# Patient Record
Sex: Male | Born: 1940 | Race: White | Hispanic: No | Marital: Married
Health system: Midwestern US, Community
[De-identification: ages and names within clinical notes are randomized; demographics above are authoritative.]

## PROBLEM LIST (undated history)

## (undated) DIAGNOSIS — J159 Unspecified bacterial pneumonia: Secondary | ICD-10-CM

## (undated) DIAGNOSIS — K7460 Unspecified cirrhosis of liver: Secondary | ICD-10-CM

## (undated) DIAGNOSIS — M4850XA Collapsed vertebra, not elsewhere classified, site unspecified, initial encounter for fracture: Principal | ICD-10-CM

## (undated) DIAGNOSIS — M549 Dorsalgia, unspecified: Secondary | ICD-10-CM

## (undated) DIAGNOSIS — IMO0002 Reserved for concepts with insufficient information to code with codable children: Secondary | ICD-10-CM

## (undated) DIAGNOSIS — I82409 Acute embolism and thrombosis of unspecified deep veins of unspecified lower extremity: Principal | ICD-10-CM

## (undated) DIAGNOSIS — J90 Pleural effusion, not elsewhere classified: Secondary | ICD-10-CM

## (undated) DIAGNOSIS — I5022 Chronic systolic (congestive) heart failure: Principal | ICD-10-CM

## (undated) DIAGNOSIS — I420 Dilated cardiomyopathy: Secondary | ICD-10-CM

## (undated) DIAGNOSIS — M545 Low back pain, unspecified: Secondary | ICD-10-CM

## (undated) DIAGNOSIS — E8801 Alpha-1-antitrypsin deficiency: Principal | ICD-10-CM

## (undated) DIAGNOSIS — M5430 Sciatica, unspecified side: Secondary | ICD-10-CM

## (undated) DIAGNOSIS — R053 Chronic cough: Secondary | ICD-10-CM

## (undated) DIAGNOSIS — D469 Myelodysplastic syndrome, unspecified: Principal | ICD-10-CM

## (undated) DIAGNOSIS — I10 Essential (primary) hypertension: Secondary | ICD-10-CM

## (undated) DIAGNOSIS — I2510 Atherosclerotic heart disease of native coronary artery without angina pectoris: Secondary | ICD-10-CM

## (undated) DIAGNOSIS — E119 Type 2 diabetes mellitus without complications: Secondary | ICD-10-CM

## (undated) DIAGNOSIS — Z973 Presence of spectacles and contact lenses: Secondary | ICD-10-CM

## (undated) DIAGNOSIS — E785 Hyperlipidemia, unspecified: Secondary | ICD-10-CM

## (undated) DIAGNOSIS — K219 Gastro-esophageal reflux disease without esophagitis: Secondary | ICD-10-CM

## (undated) DIAGNOSIS — C801 Malignant (primary) neoplasm, unspecified: Secondary | ICD-10-CM

## (undated) DIAGNOSIS — G459 Transient cerebral ischemic attack, unspecified: Secondary | ICD-10-CM

## (undated) DIAGNOSIS — M1990 Unspecified osteoarthritis, unspecified site: Secondary | ICD-10-CM

## (undated) DIAGNOSIS — I739 Peripheral vascular disease, unspecified: Secondary | ICD-10-CM

## (undated) DIAGNOSIS — Z974 Presence of external hearing-aid: Secondary | ICD-10-CM

## (undated) HISTORY — PX: TONSILLECTOMY: SUR1361

## (undated) HISTORY — PX: COLONOSCOPY: SHX174

---
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
## 1965-03-08 HISTORY — PX: SHOULDER ACROMIOPLASTY: SHX6093

## 1988-03-08 HISTORY — PX: HERNIA REPAIR: SHX51

## 2002-03-08 DIAGNOSIS — I219 Acute myocardial infarction, unspecified: Secondary | ICD-10-CM

## 2002-03-08 HISTORY — DX: Acute myocardial infarction, unspecified: I21.9

## 2002-09-05 ENCOUNTER — Inpatient Hospital Stay (HOSPITAL_COMMUNITY): Admission: EM | Admit: 2002-09-05 | Discharge: 2002-09-08 | Payer: Self-pay | Admitting: *Deleted

## 2002-09-05 ENCOUNTER — Encounter: Payer: Self-pay | Admitting: *Deleted

## 2002-09-21 ENCOUNTER — Inpatient Hospital Stay (HOSPITAL_COMMUNITY): Admission: EM | Admit: 2002-09-21 | Discharge: 2002-09-22 | Payer: Self-pay | Admitting: *Deleted

## 2002-09-21 ENCOUNTER — Encounter: Payer: Self-pay | Admitting: Emergency Medicine

## 2003-03-09 HISTORY — PX: CARDIAC CATHETERIZATION: SHX172

## 2005-12-09 ENCOUNTER — Ambulatory Visit: Payer: Self-pay | Admitting: Gastroenterology

## 2006-11-09 ENCOUNTER — Ambulatory Visit: Payer: Self-pay

## 2006-12-14 ENCOUNTER — Other Ambulatory Visit: Payer: Self-pay

## 2006-12-14 ENCOUNTER — Inpatient Hospital Stay: Payer: Self-pay | Admitting: Cardiovascular Disease

## 2007-11-09 ENCOUNTER — Ambulatory Visit: Payer: Self-pay | Admitting: Cardiology

## 2008-02-16 LAB — CBC WITH AUTO DIFFERENTIAL
Basophils %: 0.5 % (ref 0.0–2.0)
Basophils Absolute: 0 10*3 (ref 0.0–0.2)
Eosinophils %: 3.1 % (ref 0.0–5.0)
Eosinophils Absolute: 0.1 10*3 (ref 0.0–0.6)
Granulocyte Absolute Count: 1.5 10*3 — ABNORMAL LOW (ref 1.7–7.7)
Hematocrit: 36.7 % — ABNORMAL LOW (ref 40.5–52.5)
Hemoglobin: 12.7 g/dL — ABNORMAL LOW (ref 13.5–17.5)
Lymphocytes %: 40.1 % — ABNORMAL HIGH (ref 25.0–40.0)
Lymphocytes Absolute: 1.4 10*3 (ref 1.0–5.1)
MCH: 37.1 pg — ABNORMAL HIGH (ref 26–34)
MCHC: 34.6 g/dL (ref 31–36)
MCV: 107.2 fl — ABNORMAL HIGH (ref 80–100)
MPV: 10.3 fl (ref 5.0–10.5)
Monocytes %: 13.6 % — ABNORMAL HIGH (ref 0.0–8.0)
Monocytes Absolute: 0.5 10*3 (ref 0.0–0.95)
Platelets: 102 10*3 — ABNORMAL LOW (ref 135–450)
RBC: 3.42 10*6 — ABNORMAL LOW (ref 4.2–5.9)
RDW: 15.7 % — ABNORMAL HIGH (ref 11.5–14.5)
Segs Relative: 42.7 % (ref 42.0–63.0)
WBC: 3.5 10*3 — ABNORMAL LOW (ref 4.0–11.0)

## 2008-02-16 LAB — COMPREHENSIVE METABOLIC PANEL
ALT: 46 U/L — ABNORMAL HIGH (ref 10–40)
AST: 60 U/L — ABNORMAL HIGH (ref 15–37)
Albumin/Globulin Ratio: 1 — ABNORMAL LOW (ref 1.1–2.2)
Albumin: 3 g/dL — ABNORMAL LOW (ref 3.4–5.0)
Alkaline Phosphatase: 102 U/L — ABNORMAL HIGH (ref 25–100)
BUN: 24 mg/dL — ABNORMAL HIGH (ref 7–18)
CO2: 25 meq/L (ref 21–32)
Calcium: 9 mg/dL (ref 8.3–10.6)
Chloride: 113 meq/L — ABNORMAL HIGH (ref 99–110)
Creatinine: 0.8 mg/dL (ref 0.8–1.3)
GFR Est, African/Amer: >60
GFR, Estimated: >60 (ref >60)
Glucose: 102 mg/dL — ABNORMAL HIGH (ref 70–99)
Potassium: 4.1 meq/L (ref 3.5–5.1)
Sodium: 145 meq/L (ref 136–145)
Total Bilirubin: 0.7 mg/dL (ref 0.0–1.0)
Total Protein: 6.1 g/dL — ABNORMAL LOW (ref 6.4–8.2)

## 2008-02-23 ENCOUNTER — Ambulatory Visit: Payer: Self-pay

## 2008-02-26 ENCOUNTER — Ambulatory Visit: Payer: Self-pay

## 2008-07-15 NOTE — Telephone Encounter (Signed)
Patient was calling because his wife was in to see the Dr this morning and she was given information on My Chart. He said he went in to try and register himself but it told him he needed a pass code in order to do that. Does he need to be seen before he can sign up for this service or is there a generic pass code that he can use to get access to it.  Please give him a call and let him know. Thanks

## 2008-07-15 NOTE — Telephone Encounter (Signed)
His spouse advised that he needs to be seen before he can sign up for my chart.

## 2008-08-23 NOTE — Progress Notes (Signed)
Subjective:      Patient ID: Andrew [name] is a 68 y.o. male.    Hypertension  This is a chronic problem. The problem is controlled. There are no associated agents to hypertension. He has tried ACE inhibitors and alpha 1 blockers for the symptoms. The current treatment provides significant improvement of lipids. There are no compliance problems.       Review of Systems   Constitutional: Negative.    HENT: Negative.    Respiratory: Negative.    Genitourinary: Negative.      BP 121/61   Pulse 77   Wt 236 lb (107.049 kg)    Objective:   Physical Exam   Vitals reviewed.  Constitutional: He is oriented to person, place, and time. He appears well-developed and well-nourished.        Overweight   HENT:   Head: Normocephalic.   Neck: Normal range of motion. Neck supple. No thyromegaly present.   Cardiovascular: Normal rate, normal heart sounds and intact distal pulses.    Pulmonary/Chest: Effort normal and breath sounds normal.   Musculoskeletal: Edema: ankles.   Lymphadenopathy:     He has no cervical adenopathy.   Neurological: He is alert and oriented to person, place, and time.   Skin: Skin is warm and dry.   Psychiatric: He has a normal mood and affect.       A/P        HTN Well controlled, continue current regimen.  Lymphedema legs no tx.  He will try to lose weight.  He declined stockings and lymphedema tx.

## 2008-08-26 NOTE — Progress Notes (Signed)
Subjective:      Patient ID: Andrew [name] is a 68 y.o. male.    HPI  Comes in to get skin tags removed.  He has one in the left axilla and one in the right groin.  The one in the groin is often irritated  Review of Systems   Constitutional: Negative.    Respiratory: Negative.    Cardiovascular: Negative.      BP 122/74   Wt 237 lb (107.502 kg)    Objective:   Physical Exam   Constitutional: He appears well-developed and well-nourished.   Pulmonary/Chest: Effort normal and breath sounds normal.   Abdominal: Soft. Bowel sounds are normal.   Skin:        Skin tag left axilla and right groin removed with a scalpel after both ares were cleansed with betadine and then covered with gauge.  He tolerated the procedure well.       A/P      Skin Tags removed

## 2008-10-01 ENCOUNTER — Ambulatory Visit: Payer: Self-pay | Admitting: Unknown Physician Specialty

## 2008-10-14 ENCOUNTER — Ambulatory Visit: Payer: Self-pay | Admitting: Unknown Physician Specialty

## 2008-11-26 NOTE — Progress Notes (Signed)
Subjective:      Patient ID: Andrew [name] is a 68 y.o. male.    HPI  Hypertension:  Denies CP, SOB, visual changes, dizziness, palpitations or HA.  He is adherent to a low sodium diet.  Blood pressure typically runs Ok outside of the office.       Review of Systems   Constitutional: Negative.    HENT: Negative.    Respiratory: Negative.    Cardiovascular: Negative.      BP 116/70   Pulse 85   Wt 234 lb 12.8 oz (106.505 kg)    Objective:   Physical Exam   Vitals reviewed.  Constitutional: He is oriented to person, place, and time. He appears well-developed and well-nourished.   HENT:   Head: Normocephalic.   Neck: Normal range of motion. Neck supple. No thyromegaly present.   Cardiovascular: Normal rate, normal heart sounds and intact distal pulses.    Pulmonary/Chest: Effort normal and breath sounds normal.   Lymphadenopathy:     He has no cervical adenopathy.   Neurological: He is alert and oriented to person, place, and time.   Skin: Skin is warm and dry.   Psychiatric: He has a normal mood and affect.       A/p      HTN - Well controlled, continue current regimen.

## 2009-01-03 NOTE — Telephone Encounter (Signed)
From: [name],Andrew   To: Earle Gell, MD   Sent: Fri Jan 03, 2009 2:54 PM   Subject: Prescription Question    Concerning my ED problem, I would like to try Cialis daily (30 tablets) if you think it would be safe for me. If you think it's ok, then could you write me a prescription for it? If I have to have an appointment, please contact me to schedule it.

## 2009-01-20 MED ORDER — TADALAFIL 5 MG PO TABS
5 | ORAL_TABLET | ORAL | Status: DC | PRN
Start: 2009-01-20 — End: 2009-04-30

## 2009-01-20 NOTE — Progress Notes (Signed)
Subjective:      Patient ID: Andrew [name] is a 68 y.o. male.    HPI  Hypertension:  Blood pressure typically runs ok outside of the office.   He is adherent to a low sodium diet. Patient denies dizzness.  Antihypertensive medication side effects: no medication side effects noted.  Use of agents associated with hypertension: none.     ED - no response to 2.5mg  qd of cialis  Review of Systems   Constitutional: Negative.    Respiratory: Negative.    Cardiovascular: Negative.    Neurological: Negative.    Psychiatric/Behavioral: Negative.      BP 108/65  Pulse 70  Wt 238 lb (107.956 kg)    Objective:   Physical Exam   Vitals reviewed.  Constitutional: He is oriented to person, place, and time. He appears well-developed and well-nourished.   HENT:   Head: Normocephalic.   Neck: Normal range of motion. Neck supple. No thyromegaly present.   Cardiovascular: Normal rate, regular rhythm, normal heart sounds and intact distal pulses.    Pulmonary/Chest: Effort normal and breath sounds normal.   Lymphadenopathy:     He has no cervical adenopathy.   Neurological: He is alert and oriented to person, place, and time.   Skin: Skin is warm and dry.   Psychiatric: He has a normal mood and affect.       A/p      HTN - a little low since cialis started so will stop cardura and increase cialis to 5mg 

## 2009-03-11 ENCOUNTER — Encounter: Payer: Self-pay | Admitting: Unknown Physician Specialty

## 2009-04-08 ENCOUNTER — Encounter: Payer: Self-pay | Admitting: Unknown Physician Specialty

## 2009-04-30 NOTE — Assessment & Plan Note (Signed)
Check testosterone, meds have failed  Not inclined to see gu

## 2009-04-30 NOTE — Assessment & Plan Note (Signed)
Add diuretic  follow

## 2009-04-30 NOTE — Assessment & Plan Note (Signed)
Change to diuretic. Discontinue doxazosin. Wt loss bw

## 2009-04-30 NOTE — Assessment & Plan Note (Signed)
Recheck apparently has had bm

## 2009-04-30 NOTE — Progress Notes (Signed)
Chief Complaint   Patient presents with   ??? Establish Care     hypertension         HPI    1. HTN (hypertension) (401.9AF)        Daily cialis not effective. Now taking doxazosin again.     Filed Vitals:    04/30/2009  1:32 PM   BP: 110/70   Pulse: 76   Weight: 241 lb 12.8 oz (109.68 kg)       Wt Readings from Last 3 Encounters:   04/30/2009 241 lb 12.8 oz (109.68 kg)   01/20/2009 238 lb (107.956 kg)   11/26/2008 234 lb 12.8 oz (106.505 kg)         BP Readings from Last 3 Encounters:   04/30/2009 110/70   01/20/2009 108/65   11/26/2008 116/70           Review of Systems      Respiratory: Negative.    Cardiovascular: Negative.       Physical Exam:       Cardiovascular: Normal rate, regular rhythm and normal heart sounds.    Pulmonary/Chest: Effort normal and breath sounds normal.   LEs:  1+ edema.        Assessment/Plan        Plan  Edema - Skipper Cliche, MD  04/30/09 02:28 PM  Signed  Add diuretic  follow    Erectile dysfunction - Skipper Cliche, MD  04/30/09 02:29 PM  Signed  Check testosterone, meds have failed  Not inclined to see gu    HTN (hypertension) - Skipper Cliche, MD  04/30/09 02:30 PM  Signed  Change to diuretic. Discontinue doxazosin. Wt loss bw    Thrombocytopenia - Skipper Cliche, MD  04/30/09 02:30 PM  Signed  Recheck apparently has had bm                       Refuses pvx  Subjective:      Patient ID: Andrew [name] is a 69 y.o. male.    HPI    Review of Systems    Objective:   Physical Exam  .

## 2009-05-01 LAB — CBC WITH AUTO DIFFERENTIAL
Basophils %: 0.5 % (ref 0.0–2.0)
Basophils Absolute: 0 10*3 (ref 0.0–0.2)
Eosinophils %: 2.6 % (ref 0.0–5.0)
Eosinophils Absolute: 0.1 10*3 (ref 0.0–0.6)
Granulocyte Absolute Count: 1.8 10*3 (ref 1.7–7.7)
Hematocrit: 36.4 % — ABNORMAL LOW (ref 40.5–52.5)
Hemoglobin: 12.9 g/dL — ABNORMAL LOW (ref 13.5–17.5)
Lymphocytes %: 38.4 % (ref 25.0–40.0)
Lymphocytes Absolute: 1.4 10*3 (ref 1.0–5.1)
MCH: 37.7 pg — ABNORMAL HIGH (ref 26–34)
MCHC: 35.4 g/dL (ref 31–36)
MCV: 106.3 fl — ABNORMAL HIGH (ref 80–100)
MPV: 10.3 fl (ref 5.0–10.5)
Monocytes %: 10.2 %
Monocytes Absolute: 0.4 10*3
Platelets: 94 10*3 — ABNORMAL LOW (ref 135–450)
RBC: 3.43 10*6 — ABNORMAL LOW (ref 4.2–5.9)
RDW: 16.4 % — ABNORMAL HIGH (ref 11.5–14.5)
Segs Relative: 48.3 % (ref 42.0–63.0)
WBC: 3.8 10*3 — ABNORMAL LOW (ref 4.0–11.0)

## 2009-05-01 LAB — COMPREHENSIVE METABOLIC PANEL
ALT: 49 U/L — ABNORMAL HIGH (ref 10–40)
AST: 72 U/L — ABNORMAL HIGH (ref 15–37)
Albumin/Globulin Ratio: 1.1 (ref 1.1–2.2)
Albumin: 3.2 g/dL — ABNORMAL LOW (ref 3.4–5.0)
Alkaline Phosphatase: 99 U/L (ref 45–129)
BUN: 17 mg/dL (ref 7–18)
CO2: 28 meq/L (ref 21–32)
Calcium: 9.1 mg/dL (ref 8.3–10.6)
Chloride: 114 meq/L — ABNORMAL HIGH (ref 99–110)
Creatinine: 0.8 mg/dL (ref 0.8–1.3)
GFR Est, African/Amer: >60
GFR, Estimated: >60 (ref >60)
Glucose: 85 mg/dL (ref 70–99)
Potassium: 4.3 meq/L (ref 3.5–5.1)
Sodium: 145 meq/L (ref 136–145)
Total Bilirubin: 1.6 mg/dL — ABNORMAL HIGH (ref 0.0–1.0)
Total Protein: 6.3 g/dL — ABNORMAL LOW (ref 6.4–8.2)

## 2009-05-01 LAB — VITAMIN B12: Vitamin B-12: >2000 pg/mL — ABNORMAL HIGH (ref 211–911)

## 2009-05-02 LAB — TESTOSTERONE FREE AND TOTAL MALE
Free Testosterone: 54 pg/mL (ref 47–244)
Sex Hormone Binding: 83 nmol/L — ABNORMAL HIGH (ref 11–80)
Testosterone % Free: 1 % — ABNORMAL LOW (ref 1.6–2.9)
Total Testosterone: 526 ng/dL (ref 300–720)

## 2009-05-06 ENCOUNTER — Encounter: Payer: Self-pay | Admitting: Unknown Physician Specialty

## 2009-06-06 ENCOUNTER — Encounter: Payer: Self-pay | Admitting: Unknown Physician Specialty

## 2009-08-01 NOTE — Assessment & Plan Note (Signed)
Trial of flomax

## 2009-08-01 NOTE — Progress Notes (Signed)
Chief Complaint   Patient presents with   ??? 3 Month Follow-Up     medication change; swelling not improved       HPI    1. HTN (hypertension)    2. BPH (benign prostatic hyperplasia)    3. Edema        No improvement in edema in ankles. No difference in his breathing.       Filed Vitals:    08/01/09 0859   BP: 134/78   Pulse: 68   Weight: 240 lb (108.863 kg)     Wt Readings from Last 3 Encounters:   08/01/09 240 lb (108.863 kg)   04/30/09 241 lb 12.8 oz (109.68 kg)   01/20/09 238 lb (107.956 kg)     BP Readings from Last 3 Encounters:   08/01/09 134/78   04/30/09 110/70   01/20/09 108/65       Review of Systems    Some urinary sxs and erectile problems unchanged.    Physical Exam:     Trace pedal edema.    Assessment/Plan       Edema  Increase diuretic. follow    HTN (hypertension)  Good control    BPH (benign prostatic hyperplasia)  Trial of flomax

## 2009-08-01 NOTE — Assessment & Plan Note (Signed)
Increase diuretic. follow

## 2009-08-01 NOTE — Assessment & Plan Note (Signed)
Good control

## 2009-08-06 ENCOUNTER — Ambulatory Visit: Payer: Self-pay | Admitting: Internal Medicine

## 2009-10-08 NOTE — Telephone Encounter (Signed)
Spoke to pt's wife-she is driving back from [facility] (he was a pt at [facility] for 2 days there)-after he showed signs of being slower than usual,couldn't coordinate his actions and had tremors. In hospital for 2 days.  MRI at hospital unkown results.  Pt has a liver disease and she thought that his problems were due to that.  Wife stated she was concerned because last night he was swelling and sounds like he is having noisy breathing.  Pt denies trouble breathing.  Advised pt to go to hospital if they are concerned.  They have records from hospital and will bring them if they keep tomorrow's appt.  Asked wife to call and let us know if they go to hospital.

## 2009-10-08 NOTE — Telephone Encounter (Signed)
The following message was left on the Non-Emergency Line:  Andrew [name] states that she is returning from vacation and states that patient is swollen, and nothing hurts. She is concerned because he had been in the hospital while in [place].  She would like the doctors recommendation.   Should patient keep his appointment Thursday or should he go to the hospital once they arrive in [place].  She did Not leave a telephone #, so I listened to message details and it provided [phone].  When she called, she was in [place] @ that time.  Please contact her @ phone # provided.

## 2009-10-08 NOTE — Telephone Encounter (Signed)
FYI Records here.

## 2009-10-08 NOTE — Telephone Encounter (Signed)
Requested Medical records from hospital in [facility]

## 2009-10-08 NOTE — Telephone Encounter (Signed)
He should keep appt unless he is sob. Or its up to them

## 2009-10-09 LAB — AMMONIA: Ammonia: 90 umol/L — ABNORMAL HIGH (ref 11–32)

## 2009-10-09 NOTE — Progress Notes (Signed)
Assessment/Plan     HTN (hypertension)  Stop bp meds    Edema  For likely cirrhosis, start aldactone 100 qam and follow with weights and renal functions.    Cirrhosis  Refer to gi.  Start lactulose 20 gm bid.    Alpha 1 antitrypsin deficiency  Check pfts. cxr was negative.        Return in about 4 weeks (around 11/06/2009).       HPI     Chief Complaint   Patient presents with   ??? Follow-Up from Hospital     Presenting problem(s):  1. Thrombocytopenia    2. Alpha 1 antitrypsin deficiency    3. HTN (hypertension)    4. Edema    5. Cirrhosis      Hospitalized with probable cirrhosis and elevated ammonia levels. , uti, bph. Found to have significant tremor, mental status changes. Head CT negative. Started on lactulose and now off it. Ammonia level went from 116 to 53.   Started on amlodipine for bp off lisinopril/hct.      Filed Vitals:    10/09/09 1348   BP: 122/74   Pulse: 80   Weight: 251 lb (113.853 kg)     Wt Readings from Last 3 Encounters:   10/09/09 251 lb (113.853 kg)   08/01/09 240 lb (108.863 kg)   04/30/09 241 lb 12.8 oz (109.68 kg)     BP Readings from Last 3 Encounters:   10/09/09 122/74   08/01/09 134/78   04/30/09 110/70       Review of Systems     As detailed in HPI. Denies dyspnea. Some increase in abdominal girth.     Physical Exam     Constitutional: Vital signs reviewed. Oriented to person, place, and time. appears well-developed and well-nourished. In no acute distress  Cardiovascular: Normal rate and regular rhythm.  Exam reveals no gallop and no friction rub. No murmur heard. 3+peripheral edema.  Resp: Effort normal and breath sounds normal. No respiratory distress. There are no wheezes or rales.         GI: Normal bowel sounds, abdomen soft nontender without masses. + fluid wave.    No asterixis evident      Skipper Cliche MD  [address]  [place], [place] [address]

## 2009-10-09 NOTE — Patient Instructions (Signed)
Gastroenterologists:    Stevphen Meuse, MD  [phone]    Jessica Priest, MD  [phone]    Nash Mantis, MD  [phone]    Anitra Lauth, MD  [phone]    Vevelyn Francois, MD  [phone]    Burnard Leigh, MD  [phone]

## 2009-10-09 NOTE — Assessment & Plan Note (Signed)
Refer to gi.  Start lactulose 20 gm bid.

## 2009-10-09 NOTE — Assessment & Plan Note (Signed)
Check pfts. cxr was negative.

## 2009-10-09 NOTE — Assessment & Plan Note (Signed)
For likely cirrhosis, start aldactone 100 qam and follow with weights and renal functions.

## 2009-10-09 NOTE — Assessment & Plan Note (Signed)
Stop bp meds

## 2009-10-10 LAB — LAB ORDER NOTIFICATION

## 2009-10-10 LAB — RENAL FUNCTION PANEL
Albumin: 3 gm/dl — ABNORMAL LOW (ref 3.4–5.0)
BUN: 21 mg/dl — ABNORMAL HIGH (ref 7–18)
CO2: 24 mEq/L (ref 21–32)
Calcium: 8.9 mg/dl (ref 8.3–10.6)
Chloride: 113 mEq/L — ABNORMAL HIGH (ref 99–110)
Creatinine: 0.8 mg/dl (ref 0.8–1.3)
GFR Est, African/Amer: >60
GFR, Estimated: >60 (ref >60)
Glucose: 100 mg/dl — ABNORMAL HIGH (ref 70–99)
Phosphorus: 2.9 mg/dl (ref 2.5–4.9)
Potassium: 4 mEq/L (ref 3.5–5.1)
Sodium: 142 mEq/L (ref 136–145)

## 2009-10-10 LAB — PROTIME-INR
INR: 1.18 — ABNORMAL HIGH
Protime: 12.9 s — ABNORMAL HIGH (ref 9.5–12.5)

## 2009-10-10 NOTE — Telephone Encounter (Signed)
She is calling to let th doctor know they could not run the ammonia specimen because it was not on ice.

## 2009-10-18 LAB — FERRITIN: Ferritin: 587.7 ng/ml — ABNORMAL HIGH (ref 22–322)

## 2009-10-18 LAB — PROTIME/INR & PTT
INR: 1.15
Protime: 12.6 s — ABNORMAL HIGH (ref 9.5–12.5)
aPTT: 29.7 s

## 2009-10-18 LAB — IRON AND TIBC
Iron Saturation: 69 % — ABNORMAL HIGH (ref 20–50)
Iron: 177 ug/dl — ABNORMAL HIGH (ref 35–150)
TIBC: 256 ug/dl — ABNORMAL LOW (ref 260–445)

## 2009-10-19 LAB — CERULOPLASMIN: Ceruloplasmin: 28 mg/dl (ref 17–54)

## 2009-10-19 LAB — F-ACTIN (SMOOTH MUSCLE) ANTIBODY W/ REFLEX TO TITER: F-Actin IgG: 14 units (ref 0–19)

## 2009-10-19 LAB — MITOCHONDRIAL ANTIBODIES, M2: Mitochondrial Ab: 3.3 units (ref 0.0–20.0)

## 2009-10-19 LAB — HEPATITIS A ANTIBODY, TOTAL: Hep A Total Ab: POSITIVE — AB

## 2009-10-20 LAB — HEPATITIS B SURFACE ANTIGEN: Hepatitis B Surface Ag: NEGATIVE

## 2009-10-20 LAB — ANA
ANA Titer: NEGATIVE
ANA: POSITIVE

## 2009-10-20 LAB — HEPATITIS B SURFACE ANTIBODY: Hep B S Ab: <2.8 m[IU]/mL — ABNORMAL LOW

## 2009-10-20 LAB — HEPATITIS C ANTIBODY: Hepatitis C Ab: NEGATIVE

## 2009-10-21 LAB — ALPHA-1-ANTITRYPSIN W/ PHENOTYPE: A-1 Antitrypsin: <30 mg/dl — ABNORMAL LOW (ref 100–200)

## 2009-10-30 LAB — AMMONIA: Ammonia: 106 umol/L (ref 11–32)

## 2009-10-30 NOTE — Assessment & Plan Note (Signed)
Check ammonia level and possibly titrate  Follow up dr Augusto Gamble

## 2009-10-30 NOTE — Assessment & Plan Note (Signed)
Check pft report

## 2009-10-30 NOTE — Assessment & Plan Note (Signed)
Improved. Check renal

## 2009-10-30 NOTE — Progress Notes (Signed)
Assessment/Plan     Edema  Improved. Check renal    Alpha 1 antitrypsin deficiency  Check pft report     Cirrhosis  Check ammonia level and possibly titrate  Follow up dr Augusto Gamble        Return in about 2 months (around 12/30/2009).       HPI     Chief Complaint   Patient presents with   ??? Cirrhosis     3 week follow up     Presenting problem(s):  1. Cirrhosis bms are only about two daily. Only one is loose. Taking 30cc daily   2. Edema has improved with diuretic.    3. Alpha 1 antitrypsin deficiency has had pft done.     On low protein diet.     Filed Vitals:    10/30/09 1306   BP: 108/70   Pulse: 60   Height: 5' 7.5" (1.715 m)   Weight: 231 lb (104.781 kg)     Wt Readings from Last 3 Encounters:   10/30/09 231 lb (104.781 kg)   10/09/09 251 lb (113.853 kg)   08/01/09 240 lb (108.863 kg)     BP Readings from Last 3 Encounters:   10/30/09 108/70   10/09/09 122/74   08/01/09 134/78       Review of Systems     As detailed in HPI.    Physical Exam     No edema  No asterixis      Skipper Cliche MD  [address]  [place], [place] [address]

## 2009-10-31 LAB — RENAL FUNCTION PANEL
Albumin: 3.2 gm/dl — ABNORMAL LOW (ref 3.4–5.0)
BUN: 23 mg/dl — ABNORMAL HIGH (ref 7–18)
CO2: 24 mEq/L (ref 21–32)
Calcium: 9.4 mg/dl (ref 8.3–10.6)
Chloride: 107 mEq/L (ref 99–110)
Creatinine: 1.3 mg/dl (ref 0.8–1.3)
GFR Est, African/Amer: >60
GFR, Estimated: 59 — ABNORMAL LOW (ref >60)
Glucose: 124 mg/dl — ABNORMAL HIGH (ref 70–99)
Phosphorus: 3.3 mg/dl (ref 2.5–4.9)
Potassium: 5.7 mEq/L — ABNORMAL HIGH (ref 3.5–5.1)
Sodium: 135 mEq/L — ABNORMAL LOW (ref 136–145)

## 2009-11-11 ENCOUNTER — Encounter

## 2009-11-11 NOTE — Telephone Encounter (Signed)
They are requesting recent labs ,pt is coming in for liver bx. Please fax records to [phone].

## 2009-11-11 NOTE — Telephone Encounter (Signed)
Faxed

## 2009-11-12 LAB — CBC WITH AUTO DIFFERENTIAL
Basophils %: 0.4 % (ref 0.0–2.0)
Basophils Absolute: 0 10*3 (ref 0.0–0.2)
Eosinophils %: 2.4 % (ref 0.0–5.0)
Eosinophils Absolute: 0.1 10*3 (ref 0.0–0.6)
Granulocyte Absolute Count: 2.5 10*3 (ref 1.7–7.7)
Hematocrit: 36.9 % — ABNORMAL LOW (ref 40.5–52.5)
Hemoglobin: 13 gm/dl — ABNORMAL LOW (ref 13.5–17.5)
Lymphocytes %: 34.2 % (ref 25.0–40.0)
Lymphocytes Absolute: 1.7 10*3 (ref 1.0–5.1)
MCH: 38.3 pg — ABNORMAL HIGH (ref 26–34)
MCHC: 35.2 gm/dl (ref 31–36)
MCV: 108.8 fl — ABNORMAL HIGH (ref 80–100)
MPV: 11.2 fl — ABNORMAL HIGH (ref 5.0–10.5)
Monocytes %: 13.3 % — ABNORMAL HIGH (ref 0.0–12.0)
Monocytes Absolute: 0.7 10*3 (ref 0.0–1.3)
Platelets: 93 10*3 — ABNORMAL LOW (ref 135–450)
RBC: 3.4 10*6 — ABNORMAL LOW (ref 4.2–5.9)
RDW: 15.8 % — ABNORMAL HIGH (ref 11.5–14.5)
Segs Relative: 49.7 % (ref 42.0–63.0)
WBC: 5 10*3 (ref 4.0–11.0)

## 2009-11-17 NOTE — Telephone Encounter (Signed)
Left info on machine

## 2009-11-17 NOTE — Telephone Encounter (Signed)
Message copied by Deirdre Evener on Mon Nov 17, 2009  1:50 PM  ------       Message from: Skipper Cliche       Created: Mon Nov 17, 2009 11:05 AM         Let him know his breathing test looked pretty good.       No signs of emphysema

## 2009-11-25 NOTE — Procedures (Signed)
PATIENT NAME:                   PA #:             MR #Andrew [name], Andrew [name]                  [id number]        [id number]         ATTENDING PHYSICIAN:                     DATE:        DIS DATE:       Lemar Livings, MD                  10/13/2009   10/13/2009      DATE OF BIRTH:     AGE:            PATIENT TYPE:      RM #:           09-Jun-1940         68              OPF                                   INTERPRETATION:  Spirometry reveals normal FVC and FEV1.  FEV1/FVC ratio is  normal.  Lung volumes reveal normal total lung capacity and vital capacity.   Residual volume is increased at 120% of predicted.  Diffusion capacity is  diminished at 72% of predicted.     IMPRESSION:  Normal spirometry with lung volume evidence for air trapping.   There is mild reduction in diffusion capacity as well.                                            Andrew Hurst, MD     [id number]  DD: 11/25/2009 08:38   DT: 11/25/2009 09:00   Job #: [id number]  CC: Lemar Livings, MD  CC: SEND NO RESULT NO PCP

## 2009-11-26 NOTE — Telephone Encounter (Signed)
From: [facility]   To: Skipper Cliche, MD   Sent: Tue Nov 25, 2009 4:12 PM   Subject: Prescription Question    Viewing my list of medications, I noticed that an update is in order. The list indicates that I am taking SPIRONOLACTONE 100MG  1Xday. This was changed, by Dr Augusto Gamble, to 50MG  1Xday. Dr Augusto Gamble also prescribed FUROSEMIDE 40MG  1xDAY & NADOLOL 20MG  1Xday.    Andrew [name]

## 2009-12-29 NOTE — Patient Instructions (Addendum)
Preventive Review                  5-10 year plan   Health Maintenance   Topic Date Due   ??? Tetanus Vaccine Adult (11 Years And Up)  01/24/1954   ??? Pneumovax Vaccine Adult (#1) 01/24/1962   ??? Psa  01/24/1993   ??? Flu Vaccine Yearly (Adult)  12/06/2009   ??? Colon Cancer Screening Colonoscopy  04/30/2010    Colonoscopy: due  PSA: not indicated  Immunizations due: pvx, flu  Also needs hep A, B series  Flu shot yearly in the fall   Exercise:   never Recommend 30 minutes exercise                  4-5 days per week    Lipids:   No results found for this basename: LDLCALC    Repeat due   Daily Aspirin:    No Not indicated   AAA Screen:     No Not indicated   Living Will:       Yes         Other risk factors and recommendations:    ?? Have blood pressure checked at least every year  ?? If you have elevated BP or cholesterol, have blood sugar checked every year  ?? Have an Eye Exam every 2-4 years     For useful medical information, I recommend [employer]. It is a very reliable and comprehensive site.

## 2009-12-29 NOTE — Assessment & Plan Note (Signed)
Trial off flomax

## 2009-12-29 NOTE — Assessment & Plan Note (Signed)
Per Dr. Daleen Bo. Improved.

## 2009-12-29 NOTE — Assessment & Plan Note (Signed)
Running low. Will try to cut back flomax

## 2009-12-29 NOTE — Progress Notes (Signed)
Andrew [name]    Annual Wellness Visit     BD: Dec 05, 1940          DOS: 12/29/2009    Assessment/Plan     Preventive plan provided to patient in written form per Patient Instructions and noted in Preventive Review/Plan in HPI.    HTN (hypertension)  Running low. Will try to cut back flomax    BPH (benign prostatic hyperplasia)  Trial off flomax    Cirrhosis  Per Dr. Daleen Bo. Improved.       RTO 4 months   HPI   Here for initial annual wellness visit.      Wondering aout salt substitute. Started on xafaxan. Should stay on lactulose for 3-4 bms daily.  Using three times daily per lactulose. edema improved.   Preventive Review/Plan:  Current Providers  See snapshot care team     Cognitive Impairment  Reports mild memory issues. Still driving. No asterixis.    Depression Screening  1. Over the past two weeks, have you felt down, depressed or hopeless? No  2. Over the past two weeks, have you felt little interest or pleasure in doing thing?No     Functional/Safety Assessment  1. Was the patient???s timed Up & Go test unsteady or longer than 30 seconds? No  2. Do you need help with the phone, transportation, shopping, preparing meals, housework,   laundry, medications or managing money?  No  3. Have you noticed any hearing difficulties? No                       Preventive Review                  5-10 year plan   Health Maintenance   Topic Date Due   ??? Tetanus Vaccine Adult (11 Years And Up)  01/24/1954   ??? Pneumovax Vaccine Adult (#1) 01/24/1962   ??? Psa  01/24/1993   ??? Flu Vaccine Yearly (Adult)  12/06/2009   ??? Colon Cancer Screening Colonoscopy  04/30/2010    Colonoscopy: due  PSA: not indicated  Immunizations due: pvx, flu  Also needs hep A, B series  Flu shot yearly in the fall   Exercise:   never Recommend 30 minutes exercise                  4-5 days per week    Lipids:   No results found for this basename: LDLCALC    Repeat due   Daily Aspirin:    No Not indicated   AAA Screen:     No Not indicated   Living Will:       Yes            Body mass index is 34.26 kg/(m^2).    Filed Vitals:    12/29/09 0903 12/29/09 0955   BP: 98/60 110/70   Pulse: 60    Height: 5' 7.5" (1.715 m)    Weight: 222 lb (100.699 kg)      Body mass index is 34.26 kg/(m^2).     Wt Readings from Last 3 Encounters:   12/29/09 222 lb (100.699 kg)   10/30/09 231 lb (104.781 kg)   10/09/09 251 lb (113.853 kg)     BP Readings from Last 3 Encounters:   12/29/09 110/70   10/30/09 108/70   10/09/09 122/74      Patient's medications, allergies, past medical, surgical, social and family histories were reviewed and updated as appropriate.  Review of Systems     Comprehensive review negative except as otherwise noted and in HPI.    Physical Exam     Constitutional:Vital signs reviewed. Appears well-developed and well-nourished. No acute distress.  Neck: Full ROM, trachea midline. No thyromegaly present. No cervical adenopathy  Cardiovascular: Normal rate, regular rhythm and normal heart sounds.    No murmur heard. No peripheral edema. No carotid bruits  Resp: Effort normal and breath sounds normal. No respiratory distress. There are no wheezes or rales.   GI: Normal bowel sounds, abdomen soft nontender without masses or hepatosplenomegaly   Musk: Gait coordinated and smooth. No clubbing or cyanosis.  Skin: Warm and dry normal turgor. No rashes, lesions or ulcers.  Neurological: CNs normal. DTRs 2+ bilaterally.  Psychiatric:Patient is alert and oriented to person, place, and time.  Normal mood and affect.     GU: Prostate not enlarged, smooth,  symmetric, no nodules. Hemoccult negative without rectal masses        Skipper Cliche MD  [facility]

## 2009-12-30 LAB — COMPREHENSIVE METABOLIC PANEL
ALT: 69 U/L — ABNORMAL HIGH (ref 10–40)
AST: 88 U/L — ABNORMAL HIGH (ref 15–37)
Albumin/Globulin Ratio: 0.9 — ABNORMAL LOW (ref 1.1–2.2)
Albumin: 3.4 gm/dl (ref 3.4–5.0)
Alkaline Phosphatase: 112 U/L (ref 45–129)
BUN: 33 mg/dl — ABNORMAL HIGH (ref 7–18)
CO2: 30 mEq/L (ref 21–32)
Calcium: 9.3 mg/dl (ref 8.3–10.6)
Chloride: 102 mEq/L (ref 99–110)
Creatinine: 1.5 mg/dl — ABNORMAL HIGH (ref 0.8–1.3)
GFR Est, African/Amer: 60 — ABNORMAL LOW
GFR, Estimated: 50 — ABNORMAL LOW (ref >60)
Glucose: 92 mg/dl (ref 70–99)
Potassium: 4.4 mEq/L (ref 3.5–5.1)
Sodium: 137 mEq/L (ref 136–145)
Total Bilirubin: 1.4 mg/dl — ABNORMAL HIGH (ref 0.0–1.0)
Total Protein: 7 gm/dl (ref 6.4–8.2)

## 2009-12-30 LAB — LIPID PANEL
Cholesterol, Total: 172 mg/dl (ref <200)
HDL: 59 mg/dl (ref 40–60)
LDL Calculated: 84 mg/dl (ref <100)
Triglycerides: 145 mg/dl (ref <150)
VLDL Cholesterol Calculated: 29 mg/dl

## 2010-01-16 NOTE — Telephone Encounter (Signed)
From: [facility]   To: Skipper Cliche, MD   Sent: Thu Jan 15, 2010 11:15 PM   Subject: Medication Renewal Request    Original authorizing provider: Skipper Cliche, MD    Andrew [name] would like a refill of the following medications:  Lactulose 20 GM/30ML SOLN Skipper Cliche, MD]    Preferred pharmacy: [facility] - [place], [place] - [address] - P [phone] - F [phone]    Comment:  Please contact [facility] to refill this prescription. Thank You! Andrew [name]. 90 day supply please.

## 2010-01-22 NOTE — Telephone Encounter (Signed)
Rx faxed to [facility].  14 day supply sent to [facility]

## 2010-01-22 NOTE — Telephone Encounter (Signed)
Pt called [facility] and they did not receive rx for Lactulose that was order on 11-10. Please refax rx to [facility]. Also pt is requesting a 14day supply of medication be called into [facility] [phone].

## 2010-01-30 NOTE — Telephone Encounter (Signed)
Ok to fill

## 2010-01-30 NOTE — Telephone Encounter (Signed)
From: [facility]   To: Skipper Cliche, MD   Sent: Thu Jan 29, 2010 11:36 AM   Subject: Non-Urgent Medical Question    [facility] rejected the perscription for LACTULOSE, because it specified two 30ml doses daily. I am taking three 30ml doses daily. [facility] wants you to send them a new perscription requiring three  doses daily.    Thank you, Andrew [name]

## 2010-02-13 LAB — CBC WITH AUTO DIFFERENTIAL
Basophils %: 0.5 % (ref 0.0–2.0)
Basophils Absolute: 0 10*3 (ref 0.0–0.2)
Eosinophils %: 2.4 % (ref 0.0–5.0)
Eosinophils Absolute: 0.1 10*3 (ref 0.0–0.6)
Granulocyte Absolute Count: 2 10*3 (ref 1.7–7.7)
Hematocrit: 36.1 % — ABNORMAL LOW (ref 40.5–52.5)
Hemoglobin: 12.8 gm/dl — ABNORMAL LOW (ref 13.5–17.5)
Lymphocytes %: 38.5 % (ref 25.0–40.0)
Lymphocytes Absolute: 1.7 10*3 (ref 1.0–5.1)
MCH: 39.5 pg — ABNORMAL HIGH (ref 26–34)
MCHC: 35.6 gm/dl (ref 31–36)
MCV: 111.1 fl — ABNORMAL HIGH (ref 80–100)
MPV: 10.4 fl (ref 5.0–10.5)
Monocytes %: 12.8 % — ABNORMAL HIGH (ref 0.0–12.0)
Monocytes Absolute: 0.5 10*3 (ref 0.0–1.3)
Platelets: 116 10*3 — ABNORMAL LOW (ref 135–450)
RBC: 3.25 10*6 — ABNORMAL LOW (ref 4.2–5.9)
RDW: 16.2 % — ABNORMAL HIGH (ref 11.5–14.5)
Segs Relative: 45.8 % (ref 42.0–63.0)
WBC: 4.3 10*3 (ref 4.0–11.0)

## 2010-02-13 LAB — COMPREHENSIVE METABOLIC PANEL
ALT: 66 U/L — ABNORMAL HIGH (ref 10–40)
AST: 88 U/L — ABNORMAL HIGH (ref 15–37)
Albumin/Globulin Ratio: 0.9 — ABNORMAL LOW (ref 1.1–2.2)
Albumin: 3.1 gm/dl — ABNORMAL LOW (ref 3.4–5.0)
Alkaline Phosphatase: 117 U/L (ref 45–129)
BUN: 25 mg/dl — ABNORMAL HIGH (ref 7–18)
CO2: 26 mEq/L (ref 21–32)
Calcium: 9.4 mg/dl (ref 8.3–10.6)
Chloride: 107 mEq/L (ref 99–110)
Creatinine: 1.2 mg/dl (ref 0.8–1.3)
GFR Est, African/Amer: >60
GFR, Estimated: >60 (ref >60)
Glucose: 92 mg/dl (ref 70–99)
Potassium: 4.8 mEq/L (ref 3.5–5.1)
Sodium: 138 mEq/L (ref 136–145)
Total Bilirubin: 1.7 mg/dl — ABNORMAL HIGH (ref 0.0–1.0)
Total Protein: 6.5 gm/dl (ref 6.4–8.2)

## 2010-02-13 LAB — PROTIME-INR
INR: 1.18 — ABNORMAL HIGH
Protime: 12.9 s — ABNORMAL HIGH (ref 9.5–12.5)

## 2010-02-13 NOTE — Telephone Encounter (Signed)
From: Dorko,Lyman   To: Skipper Cliche, MD   Sent: Thu Feb 12, 2010 8:17 PM   Subject: Non-Urgent Medical Question    I just re-filled my spironolacto 50 MG (QTY 30) prescription at [facility]. It has 8 refills left by 11/01/2010. If Possible, I'd like you to prescribe a 90 QTY supply and fax it to [facility].    Nicholad Kautzman  16-Jan-1941

## 2010-02-15 LAB — AFP TUMOR MARKER: AFP-Tumor Marker: 2 ng/ml (ref 0–9)

## 2010-03-08 HISTORY — PX: SHOULDER ARTHROSCOPY: SHX128

## 2010-03-16 MED ORDER — FOLIC ACID 1 MG PO TABS
1 MG | ORAL_TABLET | Freq: Every day | ORAL | Status: DC
Start: 2010-03-16 — End: 2010-10-05

## 2010-03-16 NOTE — Telephone Encounter (Signed)
Will fax per Dr [name]

## 2010-03-18 NOTE — Telephone Encounter (Signed)
From: [facility]   To: Skipper Cliche, MD   Sent: Tue Mar 17, 2010 9:03 AM   Subject: Prescription Question    I requested a Rx refill, for Folic Acid, to be faxed to [facility]. I received two messages from you. One indicating a refill request sent to [facility] and the other request sent to [facility]. I'm confused!    Andrew [name].

## 2010-04-06 MED ORDER — FUROSEMIDE 40 MG PO TABS
40 MG | ORAL_TABLET | Freq: Every day | ORAL | Status: DC
Start: 2010-04-06 — End: 2011-01-05

## 2010-04-06 NOTE — Assessment & Plan Note (Signed)
Recheck potassium  No treatment needed

## 2010-04-06 NOTE — Progress Notes (Signed)
Assessment/Plan     HTN (hypertension)  Recheck potassium  No treatment needed    BPH (benign prostatic hyperplasia)  Ok off medication        Return in about 6 months (around 10/05/2010).     HPI     Chief Complaint   Patient presents with   ??? 3 Month Follow-Up     1. Cirrhosis weight down, reducing carbs.    2. HTN (hypertension) bp running low still. No dizziness.    3. BPH (benign prostatic hyperplasia) ok off flomax. Getting up once nightly.   Still on spironolactone. On lactulose tid also.      Wt Readings from Last 3 Encounters:   04/06/10 214 lb (97.07 kg)   12/29/09 222 lb (100.699 kg)   10/30/09 231 lb (104.781 kg)    BP Readings from Last 3 Encounters:   04/06/10 104/66   12/29/09 110/70   10/30/09 108/70           Review of Systems      As detailed in HPI.    Physical Exam   BP 104/66   Pulse 60   Ht 5' 7.5" (1.715 m)   Wt 214 lb (97.07 kg)   BMI 33.02 kg/m2     Benign keratosis.   Some gynecomastia.

## 2010-04-06 NOTE — Assessment & Plan Note (Signed)
Ok off medication

## 2010-04-06 NOTE — Patient Instructions (Signed)
Dermatologists      Dr. Sheth [phone]     [facility]  [phone] ([place])  [phone] ([place])    Scott Grevey, MD  Martha Hickman, MD  Shannon Groves, PA-C  [place] and [place]  [phone]        Jan Fu, MD  [phone]      Wayne Bauman, MD  [phone]      Dr Fixler   [phone]        Dr. Eisen   [phone]      [facility] [place]   [phone]

## 2010-04-07 LAB — RENAL FUNCTION PANEL
Albumin: 2.8 gm/dl — ABNORMAL LOW (ref 3.4–5.0)
BUN: 29 mg/dl — ABNORMAL HIGH (ref 7–18)
CO2: 25 mEq/L (ref 21–32)
Calcium: 8.9 mg/dl (ref 8.3–10.6)
Chloride: 107 mEq/L (ref 99–110)
Creatinine: 1.1 mg/dl (ref 0.8–1.3)
GFR Est, African/Amer: >60
GFR, Estimated: >60 (ref >60)
Glucose: 138 mg/dl — ABNORMAL HIGH (ref 70–99)
Phosphorus: 2.9 mg/dl (ref 2.5–4.9)
Potassium: 4.4 mEq/L (ref 3.5–5.1)
Sodium: 138 mEq/L (ref 136–145)

## 2010-05-04 MED ORDER — LACTULOSE 20 GM/30ML PO SOLN
20 GM/30ML | Freq: Three times a day (TID) | ORAL | Status: DC
Start: 2010-05-04 — End: 2011-01-22

## 2010-05-04 NOTE — Telephone Encounter (Signed)
From: [facility]   To: Skipper Cliche, MD   SentWynelle Link May 03, 2010 4:17 PM   Subject: Medication Renewal Request    Original authorizing provider: Skipper Cliche, MD    Sherral Hammers would like a refill of the following medications:  Lactulose 20 GM/30ML SOLN Skipper Cliche, MD]    Preferred pharmacy: [facility] - [place], [place] - [address] - P [phone] - F [phone]    Comment:  Please send a new prescription for Lactulose, 90 day supply, to [facility].  Thank You, Milus Mallick.

## 2010-05-15 LAB — COMPREHENSIVE METABOLIC PANEL
ALT: 52 U/L — ABNORMAL HIGH (ref 10–40)
AST: 76 U/L — ABNORMAL HIGH (ref 15–37)
Albumin/Globulin Ratio: 0.8 — ABNORMAL LOW (ref 1.1–2.2)
Albumin: 2.9 gm/dl — ABNORMAL LOW (ref 3.4–5.0)
Alkaline Phosphatase: 97 U/L (ref 45–129)
BUN: 40 mg/dl — ABNORMAL HIGH (ref 7–18)
CO2: 25 mEq/L (ref 21–32)
Calcium: 9.1 mg/dl (ref 8.3–10.6)
Chloride: 106 mEq/L (ref 99–110)
Creatinine: 1.1 mg/dl (ref 0.8–1.3)
GFR Est, African/Amer: >60
GFR, Estimated: >60 (ref >60)
Glucose: 107 mg/dl — ABNORMAL HIGH (ref 70–99)
Potassium: 4.5 mEq/L (ref 3.5–5.1)
Sodium: 137 mEq/L (ref 136–145)
Total Bilirubin: 1.1 mg/dl — ABNORMAL HIGH (ref 0.0–1.0)
Total Protein: 6.7 gm/dl (ref 6.4–8.2)

## 2010-05-15 LAB — CBC WITH AUTO DIFFERENTIAL
Basophils %: 0.5 % (ref 0.0–2.0)
Basophils Absolute: 0 10*3 (ref 0.0–0.2)
Eosinophils %: 2.1 % (ref 0.0–5.0)
Eosinophils Absolute: 0.1 10*3 (ref 0.0–0.6)
Granulocyte Absolute Count: 2.4 10*3 (ref 1.7–7.7)
Hematocrit: 34.2 % — ABNORMAL LOW (ref 40.5–52.5)
Hemoglobin: 12 gm/dl — ABNORMAL LOW (ref 13.5–17.5)
Lymphocytes %: 40.2 % — ABNORMAL HIGH (ref 25.0–40.0)
Lymphocytes Absolute: 2.1 10*3 (ref 1.0–5.1)
MCH: 39.2 pg — ABNORMAL HIGH (ref 26–34)
MCHC: 35.2 gm/dl (ref 31–36)
MCV: 111.3 fl — ABNORMAL HIGH (ref 80–100)
MPV: 10.1 fl (ref 5.0–10.5)
Monocytes %: 11.7 % (ref 0.0–12.0)
Monocytes Absolute: 0.6 10*3 (ref 0.0–1.3)
Platelets: 118 10*3 — ABNORMAL LOW (ref 135–450)
RBC: 3.07 10*6 — ABNORMAL LOW (ref 4.2–5.9)
RDW: 16.1 % — ABNORMAL HIGH (ref 11.5–14.5)
Segs Relative: 45.5 % (ref 42.0–63.0)
WBC: 5.2 10*3 (ref 4.0–11.0)

## 2010-05-15 LAB — PROTIME-INR
INR: 1.17 — ABNORMAL HIGH
Protime: 12.8 s — ABNORMAL HIGH (ref 9.5–12.5)

## 2010-05-17 LAB — AFP TUMOR MARKER: AFP-Tumor Marker: 2 ng/ml (ref 0–9)

## 2010-05-20 ENCOUNTER — Ambulatory Visit: Payer: Self-pay | Admitting: Internal Medicine

## 2010-05-28 LAB — SODIUM, URINE, RANDOM: Sodium, Ur: 67 mEq/L

## 2010-06-30 MED ORDER — TADALAFIL 20 MG PO TABS
20 | ORAL_TABLET | ORAL | Status: DC | PRN
Start: 2010-06-30 — End: 2011-01-04

## 2010-06-30 NOTE — Assessment & Plan Note (Signed)
Reviewed classification

## 2010-06-30 NOTE — Assessment & Plan Note (Signed)
The current treatment plan is effective.   There are no changes made to the current therapy.

## 2010-06-30 NOTE — Progress Notes (Signed)
Assessment/Plan     Cirrhosis  Reviewed classification    HTN (hypertension)  The current treatment plan is effective.   There are no changes made to the current therapy.         Return in about 6 months (around 12/30/2010) for awv.      HPI     Chief Complaint   Patient presents with   ??? 3 Month Follow-Up     1. HTN (hypertension) looks good.   3. Cirrhosis reported improved.    Elevated bs likely not fasting last time.     Wt Readings from Last 3 Encounters:   06/30/10 213 lb (96.616 kg)   04/06/10 214 lb (97.07 kg)   12/29/09 222 lb (100.699 kg)    BP Readings from Last 3 Encounters:   06/30/10 120/66   04/06/10 104/66   12/29/09 110/70           Review of Systems      As detailed in HPI.    Physical Exam     BP 120/66   Pulse 72   Ht 5' 7.25" (1.708 m)   Wt 213 lb (96.616 kg)   BMI 33.11 kg/m2    1+ edema

## 2010-08-21 LAB — CBC WITH AUTO DIFFERENTIAL
Basophils %: 0.4 % (ref 0.0–2.0)
Basophils Absolute: 0 10*3 (ref 0.0–0.2)
Eosinophils %: 2.9 % (ref 0.0–5.0)
Eosinophils Absolute: 0.1 10*3 (ref 0.0–0.6)
Granulocyte Absolute Count: 1.5 10*3 — ABNORMAL LOW (ref 1.7–7.7)
Hematocrit: 34.9 % — ABNORMAL LOW (ref 40.5–52.5)
Hemoglobin: 12.1 gm/dl — ABNORMAL LOW (ref 13.5–17.5)
Lymphocytes %: 38.8 % (ref 25.0–40.0)
Lymphocytes Absolute: 1.2 10*3 (ref 1.0–5.1)
MCH: 38.8 pg — ABNORMAL HIGH (ref 26–34)
MCHC: 34.8 gm/dl (ref 31–36)
MCV: 111.4 fl — ABNORMAL HIGH (ref 80–100)
MPV: 10.4 fl (ref 5.0–10.5)
Monocytes %: 10.7 % (ref 0.0–12.0)
Monocytes Absolute: 0.3 10*3 (ref 0.0–1.3)
Platelets: 100 10*3 — ABNORMAL LOW (ref 135–450)
RBC: 3.13 10*6 — ABNORMAL LOW (ref 4.2–5.9)
RDW: 15.8 % — ABNORMAL HIGH (ref 11.5–14.5)
Segs Relative: 47.2 % (ref 42.0–63.0)
WBC: 3.2 10*3 — ABNORMAL LOW (ref 4.0–11.0)

## 2010-08-21 LAB — COMPREHENSIVE METABOLIC PANEL
ALT: 48 U/L — ABNORMAL HIGH (ref 10–40)
AST: 68 U/L — ABNORMAL HIGH (ref 15–37)
Albumin/Globulin Ratio: 0.8 — ABNORMAL LOW (ref 1.1–2.2)
Albumin: 2.8 gm/dl — ABNORMAL LOW (ref 3.4–5.0)
Alkaline Phosphatase: 107 U/L (ref 45–129)
BUN: 29 mg/dl — ABNORMAL HIGH (ref 7–18)
CO2: 28 mEq/L (ref 21–32)
Calcium: 8.6 mg/dl (ref 8.3–10.6)
Chloride: 109 mEq/L (ref 99–110)
Creatinine: 1 mg/dl (ref 0.8–1.3)
GFR Est, African/Amer: >60
GFR, Estimated: >60 (ref >60)
Glucose: 125 mg/dl — ABNORMAL HIGH (ref 70–99)
Potassium: 4.4 mEq/L (ref 3.5–5.1)
Sodium: 138 mEq/L (ref 136–145)
Total Bilirubin: 0.9 mg/dl (ref 0.0–1.0)
Total Protein: 6.1 gm/dl — ABNORMAL LOW (ref 6.4–8.2)

## 2010-08-21 LAB — PROTIME-INR
INR: 1.11
Protime: 12.6 s

## 2010-08-21 LAB — MAGNESIUM: Magnesium: 2 mg/dl (ref 1.8–2.4)

## 2010-09-01 MED ORDER — AMILORIDE HCL 5 MG PO TABS
5 MG | ORAL_TABLET | Freq: Every day | ORAL | Status: DC
Start: 2010-09-01 — End: 2011-07-28

## 2010-09-01 NOTE — Telephone Encounter (Signed)
From: Wonders,[place]ter[place]   To: Skipper Cliche, MD   Sent: Tue Sep 01, 2010 7:40 AM   Subject: Non-Urgent Medical Question    I need a refill of my prescription AMILORIDE 5MG  Tablet. Dr. Daleen Bo, my liver Doctor, Increased the dosage to two (2) tablets by mouth daily. There are 0 refills left on my present prescription. Please send a new prescription (90 day supply) to my insurer, [employer]. Thanks!    Andrew [name].

## 2010-09-01 NOTE — Telephone Encounter (Signed)
Ok to change the directions and fill?

## 2010-09-07 MED ORDER — IOPAMIDOL 76 % IV SOLN
76 % | Freq: Once | INTRAVENOUS | Status: AC
Start: 2010-09-07 — End: 2010-09-07
  Administered 2010-09-07: 12:00:00 via INTRAVENOUS

## 2010-09-14 NOTE — Telephone Encounter (Signed)
Patient was calling because he had a CT scan on 09/07/10 at [facility].   He was calling to see if we got a copy of the results and if so could we release this information to him through My Chart?  He has not heard back from Dr. Daleen Bo on the results (this is who ordered the test).   The patient is out of town and would like to have access to these results if possible.

## 2010-09-14 NOTE — Telephone Encounter (Signed)
done

## 2010-09-14 NOTE — Telephone Encounter (Signed)
Results are in chart do you want to release them or should Dr Daleen Bo

## 2010-09-20 ENCOUNTER — Ambulatory Visit: Payer: Self-pay

## 2010-10-05 MED ORDER — FOLIC ACID 1 MG PO TABS
1 MG | ORAL_TABLET | Freq: Every day | ORAL | Status: DC
Start: 2010-10-05 — End: 2010-10-05

## 2010-10-05 MED ORDER — FOLIC ACID 1 MG PO TABS
1 MG | ORAL_TABLET | Freq: Every day | ORAL | Status: DC
Start: 2010-10-05 — End: 2011-04-05

## 2010-10-05 NOTE — Telephone Encounter (Signed)
Filled per Dr Frecka

## 2010-10-16 MED ORDER — TRAMADOL HCL 50 MG PO TABS
50 MG | ORAL_TABLET | Freq: Three times a day (TID) | ORAL | Status: DC | PRN
Start: 2010-10-16 — End: 2012-04-19

## 2010-10-16 NOTE — Telephone Encounter (Signed)
From: Rylander,Mancil   To: Skipper Cliche, MD   Sent: Fri Oct 16, 2010 8:46 AM   Subject: Non-Urgent Medical Question    Dr [name], I fell while walking the other day. I'm sure that I have a broken rib(s). Considering my liver problems, can you prescribe a strong pain medication? This broken rib is causing me considerable pain. If you can prescribe a pain medication, would you please call it in to [facility]. phone: [phone]    Andrew [name].

## 2010-10-30 ENCOUNTER — Inpatient Hospital Stay
Admit: 2010-10-30 | Discharge: 2010-10-30 | Disposition: A | Discharge status: Home / Self Care | Attending: Emergency Medicine

## 2010-10-30 LAB — CBC WITH AUTO DIFFERENTIAL
Basophils %: 1 % (ref 0.0–2.0)
Basophils Absolute: 0.1 10*3 (ref 0.0–0.2)
Eosinophils %: 0.4 % (ref 0.0–5.0)
Eosinophils Absolute: 0 10*3 (ref 0.0–0.6)
Granulocyte Absolute Count: 5.6 10*3 (ref 1.7–7.7)
Hematocrit: 39.5 % — ABNORMAL LOW (ref 40.5–52.5)
Hemoglobin: 13.9 gm/dl (ref 13.5–17.5)
Lymphocytes %: 14.4 % — ABNORMAL LOW (ref 25.0–40.0)
Lymphocytes Absolute: 1.1 10*3 (ref 1.0–5.1)
MCH: 38.5 pg — ABNORMAL HIGH (ref 26–34)
MCHC: 35.1 gm/dl (ref 31–36)
MCV: 109.9 fl — ABNORMAL HIGH (ref 80–100)
MPV: 8.9 fl (ref 5.0–10.5)
Monocytes %: 11.3 % (ref 0.0–12.0)
Monocytes Absolute: 0.9 10*3 (ref 0.0–1.3)
Platelets: 112 10*3 — ABNORMAL LOW (ref 135–450)
RBC: 3.6 10*6 — ABNORMAL LOW (ref 4.2–5.9)
RDW: 15.2 % — ABNORMAL HIGH (ref 11.5–14.5)
Segs Relative: 72.9 % — ABNORMAL HIGH (ref 42.0–63.0)
WBC: 7.6 10*3 (ref 4.0–11.0)

## 2010-10-30 LAB — BASIC METABOLIC PANEL
BUN: 40 mg/dl — ABNORMAL HIGH (ref 7–18)
CO2: 26 mEq/L (ref 21–32)
Calcium: 9.1 mg/dl (ref 8.3–10.6)
Chloride: 106 mEq/L (ref 99–110)
Creatinine: 1.9 mg/dl — ABNORMAL HIGH (ref 0.8–1.3)
GFR Est, African/Amer: 46 — ABNORMAL LOW
GFR, Estimated: 38 — ABNORMAL LOW (ref >60)
Glucose: 118 mg/dl — ABNORMAL HIGH (ref 70–99)
Potassium: 4.6 mEq/L (ref 3.5–5.1)
Sodium: 137 mEq/L (ref 136–145)

## 2010-10-30 LAB — URINALYSIS
Bilirubin Urine: NEGATIVE
Glucose, Ur: NEGATIVE mg/dl
Ketones, Urine: NEGATIVE mg/dl
Leukocyte Esterase, Urine: NEGATIVE
Nitrite, Urine: NEGATIVE
Protein, UA: NEGATIVE mg/dl
Specific Gravity, UA: 1.015 (ref 1.005–1.030)
Urobilinogen, Urine: 0.2 EU/dl (ref <2.0)
pH, UA: 6 (ref 4.5–8.0)

## 2010-10-30 LAB — HEPATIC FUNCTION PANEL
ALT: 45 U/L — ABNORMAL HIGH (ref 10–40)
AST: 58 U/L — ABNORMAL HIGH (ref 15–37)
Albumin: 3.2 gm/dl — ABNORMAL LOW (ref 3.4–5.0)
Alkaline Phosphatase: 138 U/L — ABNORMAL HIGH (ref 45–129)
Bilirubin, Direct: 0.6 mg/dl — ABNORMAL HIGH (ref 0.0–0.3)
Bilirubin, Indirect: 1.7 mg/dl — ABNORMAL HIGH (ref 0.0–1.0)
Total Bilirubin: 2.3 mg/dl — ABNORMAL HIGH (ref 0.0–1.0)
Total Protein: 7.2 gm/dl (ref 6.4–8.2)

## 2010-10-30 LAB — AMMONIA: Ammonia: 27 umol/L (ref 11–32)

## 2010-10-30 LAB — LIPASE: Lipase: 42 U/L (ref 5.6–51.3)

## 2010-10-30 MED ORDER — HYDROCODONE-IBUPROFEN 7.5-200 MG PO TABS
ORAL_TABLET | Freq: Three times a day (TID) | ORAL | Status: DC | PRN
Start: 2010-10-30 — End: 2011-01-04

## 2010-10-30 MED ORDER — CEPHALEXIN 500 MG PO CAPS
500 MG | ORAL_CAPSULE | Freq: Four times a day (QID) | ORAL | Status: AC
Start: 2010-10-30 — End: 2010-11-09

## 2010-10-30 MED ADMIN — sodium chloride 0.9 % 500 mL bolus: INTRAVENOUS | @ 20:00:00 | NDC 00338004903

## 2010-10-30 MED FILL — SODIUM CHLORIDE 0.9 % IV SOLN: 0.9 % | INTRAVENOUS | Qty: 500

## 2010-10-30 NOTE — ED Provider Notes (Signed)
I have personally performed and/or participated in the history, exam and medical decision making and agree with all pertinent clinical information.  I have also reviewed and agree with the past medical, family and social history unless otherwise noted.    Donney Dice, MD  10/30/10 [phone]

## 2010-10-30 NOTE — Telephone Encounter (Signed)
CREATED IN ERROR

## 2010-10-30 NOTE — ED Provider Notes (Signed)
HPI  Chief Complaint:    Chief Complaint   Patient presents with   . Fall     pt reports falling 10 days ago and hurt his right ribs and yesterday and hit his face.    . Nephrolithiasis     pt c/o right flank pain states it feels like a kidney stone for 3 days.      HPI:    This is a  70 y.o. male patient tripped and fell while walking early morning dark and tripping over a sidewalk was elevated 10 days ago landing on his right ribs.  His family doctor call in a prescription for tramadol which he states helped his rib pain however he's run out of his medications.  The patient tripped and fell again yesterday morning while walking in the dark outside and struck the right side of his head lacerating his eyebrow.  He cleaned the wound himself and did not think he needed to be seen in the emergency room but states that he has reinjured the ribs they continue to be painful on the right side.  Since there the posterior and anterior aspect of the ribs these are his lower ribs he is concerned this may also be a kidney stone causing pain as opposed to the rib fracture.  His family doctor did not want to call in any additional pain medication and request the patient be seen in the emergency department to be evaluated for possible rib fracture or kidney stone due to the patient's posterior rib pain.  Denies shortness of breath denies headache confusion disorientation dizziness denies neck back or extremity pain.      PAST MEDICAL HISTORY   has a past medical history of Hypertension; Kidney stones; Alpha 1 antitrypsin deficiency; and Thrombocytopenia (04/30/2009).    PAST SURGICAL HISTORY   has past surgical history that includes AV fistula repair (1970's).    FAMILY HISTORY  family history includes Diabetes in his brother, mother, and sister.    SOCIAL HISTORY   reports that he has never smoked. He has never used smokeless tobacco. He reports that he does not drink alcohol or use illicit drugs.    HOME MEDICATIONS     Prior  to Admission medications    Medication Sig Start Date End Date Taking? Authorizing Provider   HYDROcodone-ibuprofen (VICOPROFEN) 7.5-200 MG per tablet Take 1 tablet by mouth every 8 hours as needed for Pain. 10/30/10  Yes Skipper Cliche, PA   cephALEXin (KEFLEX) 500 MG capsule Take 1 capsule by mouth 4 times daily for 10 days. 10/30/10 11/09/10 Yes Skipper Cliche, PA   folic acid (FOLVITE) 1 MG tablet Take 1 tablet by mouth daily. 10/05/10   Skipper Cliche, MD   aMILoride (MIDAMOR) 5 MG tablet Take 2 tablets by mouth daily. 09/01/10   Skipper Cliche, MD   tadalafil (CIALIS) 20 MG tablet Take 1 tablet by mouth as needed for Erectile Dysfunction. 06/30/10   Skipper Cliche, MD   Lactulose 20 GM/30ML SOLN Take 20 g by mouth 3 times daily. 05/04/10   Skipper Cliche, MD   furosemide (LASIX) 40 MG tablet Take 0.5 tablets by mouth daily. 04/06/10   Skipper Cliche, MD   rifaximin (XIFAXAN) 550 MG tablet Take 550 mg by mouth 2 times daily.      Historical Provider, MD   Ascorbic Acid (VITAMIN C CR) 1000 MG TBCR Take  by mouth.      Historical Provider, MD   nadolol (CORGARD) 20 MG tablet  Take 1 tablet by mouth daily. 11/25/09 11/25/10  Skipper Cliche, MD   Multiple Vitamins-Minerals (CENTRUM SILVER PO) Take  by mouth daily. 08/23/08   Historical Provider, MD   Cyanocobalamin (VITAMIN B 12 PO) Take  by mouth daily. 08/23/08   Historical Provider, MD      ALLERGIES   has no known allergies.     Review of Systems  Remainder of ROS reviewed and Negative.  Nursing notes reviewed and agree with above.    Physical Exam   Constitutional: He is oriented to person, place, and time.   HENT:   Head: Normocephalic.        2.5 cm laceration horizontally just below the lateral aspect of the right eyebrow with some the wound and separation noted this is a granulated wound.  There is an abrasion over the right maxilla also noted this area is nontender.   Eyes: Conjunctivae and EOM are normal. Pupils are equal, round, and reactive to light.   Neck: Normal range of motion.  Neck supple. No JVD present.   Pulmonary/Chest: Effort normal. No respiratory distress. He has no wheezes. He has no rales.        Lungs are clear to auscultation.  Patient chest wall is tender on the right anterior ribs and in the posterior ribs but no flail segment or deformity noted.  No crepitus noted.   Musculoskeletal: Normal range of motion. He exhibits no edema and no tenderness.        No spinal or extremity tenderness noted.   Neurological: He is alert and oriented to person, place, and time. No cranial nerve deficit. Coordination normal.   Skin: Skin is warm and dry. He is not diaphoretic.   Psychiatric: He has a normal mood and affect. His behavior is normal.     BP 111/60  Pulse 61  Temp(Src) 97.8 F (36.6 C) (Oral)  Resp 17  SpO2 95%      Procedures  None    MDM    Labs  Labs Reviewed   CBC WITH AUTO DIFFERENTIAL - Abnormal; Notable for the following:     RBC 3.60 (*)     Hematocrit 39.5 (*)     MCV 109.9 (*)     MCH 38.5 (*)     RDW 15.2 (*)     Platelets 112 (*)     Segs Relative 72.9 (*)     Lymphocytes Relative 14.4 (*)     All other components within normal limits   BASIC METABOLIC PANEL - Abnormal; Notable for the following:     Glucose 118 (*)     BUN 40 (*)     Creatinine, Ser 1.9 (*)     GFR, Estimated 38 (*)     GFR Est, African/Amer 46 (*)     All other components within normal limits   HEPATIC FUNCTION PANEL - Abnormal; Notable for the following:     Alb 3.2 (*)     Total Bilirubin 2.30 (*)     Bilirubin, Direct 0.60 (*)     Bilirubin, Indirect 1.7 (*)     Alkaline Phosphatase 138 (*)     ALT 45 (*)     AST 58 (*)     All other components within normal limits   URINALYSIS - Abnormal; Notable for the following:     RBC, UA 5-10 (*)     All other components within normal limits    Narrative:     STAT urine  dip.  Microscopic anal...  STAT urine dip.  Microscopic anal...   LIPASE   AMMONIA       Radiology  RIGHT RIBS   HISTORY- Pain   FINDINGS-  There is deformity of the eighth and ninth  ribs posteriorly. These  findings have the appearance of acute or subacute fracture. There  is no pneumothorax identified. Lungs are generally clear. Heart is  normal. Mediastinum is unremarkable. The spine is unremarkable.     IMPRESSION-     There appear to me be acute and/or subacute fractures of the  eighth and ninth ribs on the right side posteriorly. There is no  pneumothorax or other chest abnormality.    Electronically Read ByLendell Caprice M.D.    CT Head  History is given of injury in a fall yesterday.  Bone images demonstrate no calvarial fracture or other acute bony  abnormality. There is, however, demonstration of fluid in the  posterior aspect of the right maxillary sinus. Intracranially,  there is no evidence of hemorrhage, mass effect, midline shift or  extra-axial collection. Posterior fossa and brainstem regions are  normal.     IMPRESSION- 1. Right maxillary sinus fluid.  2. No intracranial abnormality is present.    Dictated on- 10/30/2010 14-21-23  Electronically Read By- Sherin Quarry M.D.        EKG Interpretation   NA    Critical Care  As a mid-level provider I do not provide critical care.  Please see attending note for any critical care time documentation.    ED Course  Vital signs remain stable throughout stay.    Consultations  None    Medical Decision Making    Patient has no sign of elevated ammonia level.  He is acting at his baseline per wife and appears to be lucid and reasonable at this time.  He does have an eyebrow laceration which will require coverage with antibiotics but due to the age of the laceration being more than 24 hours old it is not suturable at this time.  No sign of spinal injury he does have rib fractures on the right with no pneumothorax or effusion noted.  My suspicion for kidney stone as low as the patient's rib fractures are in the area of the patient's described pain.  We will treat with pain medication and antibiotics and followup with his family  physician.          Skipper Cliche, [place]  10/30/10 [phone]

## 2010-10-30 NOTE — Telephone Encounter (Signed)
Called patient stated he fell against curb yesterday, has a gash the he said he squeezed together to stop bleeding. Also said he feels his ribs are moving around when he lifts his arms. Informed patient to go to er for eval and will go to [facility] [place]

## 2010-10-30 NOTE — Telephone Encounter (Signed)
This patient is scheduled with Dr. Elesa Massed, and will have to be reviewed and patient may need to contacted.

## 2010-10-30 NOTE — Telephone Encounter (Signed)
Will have to see another doctor

## 2010-10-30 NOTE — Telephone Encounter (Signed)
Patient states that Dr. [name] responded to her email stating that he needed an appointment.  Next available was Monday, and he wants to be seen sooner.  Did you want to see him today? (You would have to add on)  Please Advise.  Thanks!

## 2010-10-30 NOTE — Discharge Instructions (Signed)
Drink more fluids at home every day.  Be careful when you walk.  Use ice on the areas of soreness.  Take the pain medicine if needed for pain.  Follow the head injury and laceration instruction injuries below.                Laceration Care  A laceration is a cut or lesion that goes through all layers of the skin and into the tissue just beneath the skin.  BEFORE THE PROCEDURE  The laceration will be inspected by your caregiver for amount and extent of tissue damage, for bleeding, for evidence of foreign bodies (pieces of dirt, glass, etc.), and for cleanliness. Pain medications can be used if necessary. The wound will then be cleansed to help prevent infection. Sutures, staples or skin adhesive strips will be used to close the wound, stop bleeding and speed healing. Sometimes this will decrease the likelihood of infection and bleeding.  LET YOUR CAREGIVERS KNOW ABOUT THE FOLLOWING:   Allergies.    Medications taken including herbs, eye drops, over the counter medications, and creams.      Use of steroids (by mouth or creams).      Previous problems with anesthetics or novocaine.     Possibility of pregnancy, if this applies.    History of blood clots (thrombophlebitis).      History of bleeding or blood problems.      Previous surgery.      Other health problems.      RISKS AND COMPLICATIONS  Most lacerations heal fully. The healing time required varies depending on location. Complications of a laceration can include pain, bleeding, infection, dehiscence (splitting open or separation of the wound edges) and scar formation. The likelihood of complication depends on wound complexity, location, and on how the laceration occurred.  HOME CARE INSTRUCTIONS   If you were given a dressing, you should change it at least once a day, or as instructed by your caregiver. If the bandage sticks, soak it off with water.    Twice a day, wash the area with soap and water and rinse with plain water to remove all soap. Pat  (do not rub) dry with a clean towel. Look for signs of infection (see below).    Re-apply prescribed creams or ointments as instructed. This will help prevent infection. This also helps keep the bandage from sticking.    If the bandage becomes wet, dirty, or develops a foul smell, change it as soon as possible.    Only take over-the-counter or prescription medicines for pain, discomfort, or fever as directed by your doctor.    Have your sutures, staples or skin adhesive strips removed as instructed. Skin adhesive strips will peel off from the outer edges toward the center and will eventually fall off. Report to your caregiver if the strips are falling off and the wound does not appear fully healed.   SEEK MEDICAL CARE IF:   There is redness, swelling, or increasing pain in the wound.    There is a red line that goes up your arm or leg.    Pus is coming from wound.    You develop an unexplained oral temperature above 102 F (38.9 C), or as your caregiver suggests.    You notice a foul smell coming from the wound or dressing.    There is a breaking open of the wound (edges not staying together) before or after sutures have been removed.    You notice something  coming out of the wound such as wood or glass.    The wound is on your hand or foot and you find that you are unable to properly move a finger or toe.    There is severe swelling around the wound causing pain and numbness or a change in color in your arm, hand, leg, or foot.   SEEK IMMEDIATE MEDICAL CARE IF:   Pain is not controlled with prescribed medication or with acetaminophen or ibuprofen.    There is severe swelling around the wound site.    The wound splits open and bleeding recurs.    You experience worsening numbness, weakness, or loss of function of any joint around or beyond the wound.    You develop painful lumps near the wound or on the skin anywhere on your body.   If you did not receive a tetanus shot today because you did not  recall when your last one was given, make sure to check with your caregiver when you have your sutures removed to determine if you need one.  MAKE SURE YOU:     Understand these instructions.    Will watch your condition.    Will get help right away if you are not doing well or get worse.   Document Released: 02/22/2005 Document Re-Released: 05/19/2009  [facility] Patient Information 2012 [place].    Rib Fracture  You have a rib fracture (a break in a rib). This can occur by a blow to the chest, by a fall against a hard object, or by violent coughing or sneezing. There may be one or more breaks. Rib fractures may heal on their own within 3 to 8 weeks. The longer healing periods are often associated with a continued cough or other aggravating activities.  HOME CARE INSTRUCTIONS   Avoid strenuous activity. Be careful during activities. Avoid bumping the injured rib. Activities that cause pain and pulls on the fracture site(s) are best avoided if possible.    Eat a normal, well-balanced diet. Drink plenty of fluids to avoid constipation.    Take deep breaths several times a day to keep lungs free of infection. Try to cough several times a day, splinting the injured area with a pillow. This will help prevent pneumonia.    Do not wear a rib belt or binder. These restrict breathing that can lead to pneumonia.    Only take over-the-counter or prescription medicines for pain, discomfort, or fever as directed by your caregiver.   SEEK MEDICAL CARE IF:   An oral temperature above 102 F (38.9 C) develops, or as your caregiver suggests, that is not controlled by medication.    You develop a continual cough along with thick or bloody sputum.   SEEK IMMEDIATE MEDICAL CARE IF:   You have difficulty breathing.    You have nausea, vomiting, or abdominal pain.    You have worsening pain, not controlled with medications.   Document Released: 06/13/2002 Document Re-Released: 08/11/2007  [facility] Patient  Information 2012 [place].      Head Injuries, Adult  A common head injury is a concussion. A concussion is a state of changed mental ability. It usually occurs from a blow to the head. Only drink water or clear liquids for the rest of the day. Then you can go back to your regular diet. For 2 days, do not have or take:   Alcohol.    Sedatives.   Most problems occur within the first 24 hours.  YOU  MAY HAVE PROBLEMS AT HOME WITH:   Memory.      Dizziness.     Headaches.    Double vision.    Hearing.     Depression.    Tiredness.    Weakness.     Concentration.     If you have these problems, do not be alarmed. A bruise on the brain takes a few days to heal. Usually, these problems go away without medical care. Call your doctor if problems last for more than one day. See your doctor sooner if problems get worse.  HOME CARE   During the next 24 hours stay with someone who can watch you.    This person should watch you for the problems above.    This person should wake you up every 2 to 3 hours to check on your condition. In case of an emergency, or if he or she cannot be awakened, call your local medical emergency services (911 in the [place]    Only take medicines as told by your doctor.   Side effects may happen for up to 7 to 10 days. Watch for new problems.  GET HELP RIGHT AWAY IF:   You are confused, dizzy, or unsteady.    You are sleepy.    You feel sick to your stomach (nauseous).    You are throwing up (vomiting).    You have trouble walking.    You have convulsions (fits or seizures).    You have very bad, lasting headaches that are not helped by medicine.    You have changes in the black center (pupil) of your eyes.    You have clear or bloody fluid coming from your nose or ears.   MAKE SURE YOU:     Understand these instructions.    Will watch your condition.    Will get help right away if you are not doing well or get worse.   Document Released: 02/05/2008 Document  Re-Released: 05/19/2009  [facility] Patient Information 2012 [place].

## 2010-11-12 LAB — BASIC METABOLIC PANEL
BUN: 30 mg/dl — ABNORMAL HIGH (ref 7–18)
CO2: 24 mEq/L (ref 21–32)
Calcium: 8.8 mg/dl (ref 8.3–10.6)
Chloride: 105 mEq/L (ref 99–110)
Creatinine: 1.3 mg/dl (ref 0.8–1.3)
GFR Est, African/Amer: >60
GFR, Estimated: 59 — ABNORMAL LOW (ref >60)
Glucose: 130 mg/dl — ABNORMAL HIGH (ref 70–99)
Potassium: 4.1 mEq/L (ref 3.5–5.1)
Sodium: 137 mEq/L (ref 136–145)

## 2010-11-13 LAB — C DIFF TOXIN/ANTIGEN: C. DIFFICILE AG/TOXIN A+B: NOT DETECTED

## 2010-11-13 LAB — O&P SCREEN(CRYPTOSPORIDIUM/GIARDIA/E.HISTOLYTICA) #1
Cryptosporidium Ag: NEGATIVE
E Histolytica Ag: NEGATIVE
Giardia Ag, Stl: NEGATIVE

## 2010-11-14 LAB — SHIGA-LIKE TOXIN ANTIGEN, EIA: shiga toxin: NOT DETECTED

## 2011-01-04 MED ORDER — NADOLOL 20 MG PO TABS
20 MG | ORAL_TABLET | Freq: Every day | ORAL | Status: DC
Start: 2011-01-04 — End: 2011-11-30

## 2011-01-04 MED ORDER — NADOLOL 20 MG PO TABS
20 MG | ORAL_TABLET | Freq: Every day | ORAL | Status: DC
Start: 2011-01-04 — End: 2011-01-04

## 2011-01-04 NOTE — Progress Notes (Signed)
Addended by: Azell Der on: 01/04/2011 01:28 PM     Modules accepted: Orders

## 2011-01-04 NOTE — Progress Notes (Signed)
Andrew [name]    Annual Wellness Visit     BD: [birth year]          DOS: 01/04/2011                                              2012 Preventive Plan           Preventive Measures Status                   Recommendations   Prostate Cancer Screen:  This test is not clinically indicated   Colon Cancer Screen: 4/12  (Colonoscopy) Repeat in 5 years   Diabetes Screen:  Glucose (mg/dl)   Date Value   03/13/1094 130*    Repeat every 6 months   Lipids:   LDL Calculated (mg/dl)   Date Value   04/54/0981 84     Repeat every 3 years   Aspirin for Heart/Stroke Prevention:  Contraindicated due to liver disease   Weight: Body mass index is 31.02 kg/(m^2).  5' 7.5" (1.715 m)201 lb (91.173 kg)  Your BMI is 25 or greater, which indicates that you are overweight     Living Will: Yes      Recommended Immunizations   Immunization History   Administered Date(s) Administered   ??? Hepatitis B 01/05/2010, 02/04/2010, 05/06/2010   ??? Influenza Virus Vaccine 12/29/2009   ??? Pneumococcal Polysaccharide 12/29/2009   ??? Tdap 01/05/2010      -Shingles vaccine is recommended as a one time shot after age 73. A prescription is provided to receive that at the pharmacy.   declined  -Repeat a flu shot yearly in the fall   Risk Factors and Conditions Identified During Visit  Risks Identified Treatment options   Psychosocial Risks   ?? None identified   ?? No treatment is necessary   Behavioral Risks  ?? None identified   ?? No treatment is necessary   Functional/Safety Risks  ?? Risk of falls   No treatment is necessary     Other Recommendations/Plans     Hypertension  -     The treatment plan is effective; continue the current therapy.    Cirrhosis  -     The treatment plan is effective; continue the current therapy.  No changes. bw recently stable.             Return in about 6 months (around 07/05/2011).        Here for an [employer].    AWV Exam  Written HRA completed by patient, reviewed and results are documented at the end of this note.    Current  Providers  Patient Care Team:  Skipper Cliche, MD as PCP - General (Internal Medicine)  Suella Grove, MD as Consulting Physician (Gastroenterology)  Kristine Linea, MD as Consulting Physician (Gastroenterology)    Cognitive Function Assessment  Some mild memory problems. No deterioration.     Alcohol Use Screening  Audit-C Performed Results Score: 0 negative   (Positive >3 Men, >2 Women)     Depression Screening   PHQ-2 performed Results Score: 1 negative  Current or previous history of mood disorder or depression: No      Other Risk factors identified per HRA/History are detailed in the Preventive Plan above.   Larey Seat couple times in summer. Walks in day now. Was at night.  Kidney stone in summer also.     Medical conditions and problems addressed:    Patient Active Problem List   Diagnoses   ??? Hypertension   ??? Erectile dysfunction   ??? Thrombocytopenia   ??? Edema  minimal   ??? BPH (benign prostatic hyperplasia)  Frequent urination.    ??? Alpha-1-antitrypsin deficiency   ??? Cirrhosis  No sig varices found on endoscopy.       Question on how much lasix he is taking.          A comprehensive review of systems was negative except for:  ?? Son living with him now.       BP 124/70   Ht 5' 7.5" (1.715 m)   Wt 201 lb (91.173 kg)   BMI 31.02 kg/m2    Wt Readings from Last 3 Encounters:   01/04/11 201 lb (91.173 kg)   06/30/10 213 lb (96.616 kg)   04/06/10 214 lb (97.07 kg)      BP Readings from Last 3 Encounters:   01/04/11 124/70   10/30/10 127/71   06/30/10 120/66                Constitutional:  Appears well-developed and well-nourished. No acute distress.  Eyes: Sclera white, conjunctiva clear. No lid lag. PERRL  Mouth: Oropharynx is clear and moist. No lesions evident.  Neck: Full ROM, trachea midline. No thyromegaly present. No cervical adenopathy  Cardiovascular: Normal rate, regular rhythm and normal heart sounds.    No murmur heard. No peripheral edema. No carotid bruit.  Resp: Effort normal and breath sounds normal.  No respiratory distress. There are no wheezes or rales.   GI: Normal bowel sounds, abdomen soft nontender without masses or hepatosplenomegaly   Musk: Gait coordinated and smooth. No clubbing or cyanosis.  Skin: Warm and dry normal turgor. No rashes, lesions or ulcers.  Neuro: Cranial nerves are normal.Mental status normal.    Gait normal.  Cerebellar function is normal.   Motor function of extremities symmetric and without weakness.   Psychiatric:Patient is alert and oriented to person, place, and time.  Normal mood and affect.   GU: prostate normal in size without masses or nodules, guaiac negative stool          Risks identified and results from online HRA   LL      [birth year]      <<<<<Risks Identified from HRA>>>>>      Hearing Loss      Safety Risk      Fall Risk      <<<<<Questionnaire>>>>>      Timestamp 10/24/2010 14:05:55     Your Initials LL     Your birth date 11-29-1940     In general, would you say your health is: Good     In the last 30 days, have you used tobacco? No     Average amount smoked/used per day:      How often do you have a drink containing alcohol? Never     How many drinks do you have on a typical day when you are drinking? None, I do not drink     How often do you have five or more drinks in a day? Never     In the past 2 weeks, how often have you felt down, depressed, or hopeless? Almost never     In the past 2 weeks, how often have you felt little interest or pleasure in doing things? Some of the time  In the past 2 weeks, how much have you been bothered by anxiety, worry or stress? Not at all     In the past 2 weeks how often have you been bothered because you were not able to stop or control worrying? Not at all     Who do you live with? Spouse     How often do you get the social and emotional support you need? Always     In the past 7 days, how much pain have you felt? Some     Do you exercise for at least 20 minutes at least three times per week? Yes, most of the time     Have  you lost any weight without trying in the past three months? No   Do you or your family notice any trouble with your hearing? Yes    Do you have working smoke detectors? Yes     Are your doorways, halls, and stairs free of clutter?  Yes     Do all your stairways have a railing or banister? Yes     Do you have non-slip mats in all bathtubs and showers? No    Have any throw rugs been removed or fastened down? No    Do you always fasten your seat belt when you are in a car? Yes    Have you fallen two or more times in the past year? Yes     In the past 7 days, did you need help from others to perform everyday activities such as eating, getting dressed, grooming, bathing, walking, or using the toilet? No     In the past 7 days, did you need help from others to take care of things such as laundry, housekeeping, banking, shopping, using the telephone, food preparation, transportation, or taking your own medications? No     Have you seen a dentist in the past year? Yes     Do you have a living will? Yes

## 2011-01-04 NOTE — Patient Instructions (Signed)
2012 Preventive Plan           Preventive Measures Status                   Recommendations   Prostate Cancer Screen:  This test is not clinically indicated   Colon Cancer Screen: 4/12  (Colonoscopy) Repeat in 5 years   Diabetes Screen:  Glucose (mg/dl)   Date Value   09/13/2954 130*    Repeat every 6 months   Lipids:   LDL Calculated (mg/dl)   Date Value   21/30/8657 84     Repeat every 3 years   Aspirin for Heart/Stroke Prevention:  Contraindicated due to liver disease   Weight: Body mass index is 31.02 kg/(m^2).  5' 7.5" (1.715 m)201 lb (91.173 kg)  Your BMI is 25 or greater, which indicates that you are overweight     Living Will: Yes      Recommended Immunizations   Immunization History   Administered Date(s) Administered   . Hepatitis B 01/05/2010, 02/04/2010, 05/06/2010   . Influenza Virus Vaccine 12/29/2009   . Pneumococcal Polysaccharide 12/29/2009   . Tdap 01/05/2010      -Shingles vaccine is recommended as a one time shot after age 29. A prescription is provided to receive that at the pharmacy.   declined  -Repeat a flu shot yearly in the fall   Risk Factors and Conditions Identified During Visit  Risks Identified Treatment options   Psychosocial Risks    None identified    No treatment is necessary   Behavioral Risks   None identified    No treatment is necessary   Functional/Safety Risks   Risk of falls   No treatment is necessary     Other Recommendations/Plans     Hypertension  -     The treatment plan is effective; continue the current therapy.    Cirrhosis  -     The treatment plan is effective; continue the current therapy.  No changes. bw recently stable.

## 2011-01-05 MED ORDER — FUROSEMIDE 40 MG PO TABS
40 MG | ORAL_TABLET | Freq: Every day | ORAL | Status: DC
Start: 2011-01-05 — End: 2011-04-23

## 2011-01-10 NOTE — Progress Notes (Signed)
Addended by: Skipper Cliche on: 01/10/2011 09:18 AM     Modules accepted: Orders

## 2011-01-22 MED ORDER — LACTULOSE 20 GM/30ML PO SOLN
20 GM/30ML | Freq: Three times a day (TID) | ORAL | Status: DC
Start: 2011-01-22 — End: 2011-09-26

## 2011-01-22 NOTE — Telephone Encounter (Signed)
From: [facility]   To: Skipper Cliche, MD   Sent: Fri Jan 22, 2011 4:55 PM   Subject: Non-Urgent Medical Question    Dr [name], I need a prescription written and sent to [facility] for my Lactulose. I have a 21 day supply left. It usually takes a couple weeks for [facility] to get the prescription to me. I take 30 ml 3 times a day. I need a 90 day supply. Questions ? Call me.  Thanks,  Andrew [name].

## 2011-02-11 LAB — COMPREHENSIVE METABOLIC PANEL
ALT: 54 U/L — ABNORMAL HIGH (ref 10–40)
AST: 74 U/L — ABNORMAL HIGH (ref 15–37)
Albumin/Globulin Ratio: 0.9 — ABNORMAL LOW (ref 1.1–2.2)
Albumin: 2.9 gm/dl — ABNORMAL LOW (ref 3.4–5.0)
Alkaline Phosphatase: 119 U/L (ref 45–129)
BUN: 27 mg/dl — ABNORMAL HIGH (ref 7–18)
CO2: 27 mEq/L (ref 21–32)
Calcium: 9.1 mg/dl (ref 8.3–10.6)
Chloride: 108 mEq/L (ref 99–110)
Creatinine: 1 mg/dl (ref 0.8–1.3)
GFR Est, African/Amer: >60
GFR, Estimated: >60 (ref >60)
Glucose: 94 mg/dl (ref 70–99)
Potassium: 4.5 mEq/L (ref 3.5–5.1)
Sodium: 140 mEq/L (ref 136–145)
Total Bilirubin: 1.3 mg/dl — ABNORMAL HIGH (ref 0.0–1.0)
Total Protein: 6.2 gm/dl — ABNORMAL LOW (ref 6.4–8.2)

## 2011-02-11 LAB — CBC WITH AUTO DIFFERENTIAL
Basophils %: 0.8 % (ref 0.0–2.0)
Basophils Absolute: 0 10*3 (ref 0.0–0.2)
Eosinophils %: 4.5 % (ref 0.0–5.0)
Eosinophils Absolute: 0.2 10*3 (ref 0.0–0.6)
Granulocyte Absolute Count: 1.5 10*3 — ABNORMAL LOW (ref 1.7–7.7)
Hematocrit: 35 % — ABNORMAL LOW (ref 40.5–52.5)
Hemoglobin: 12.3 gm/dl — ABNORMAL LOW (ref 13.5–17.5)
Lymphocytes %: 42.7 % — ABNORMAL HIGH (ref 25.0–40.0)
Lymphocytes Absolute: 1.5 10*3 (ref 1.0–5.1)
MCH: 39.1 pg — ABNORMAL HIGH (ref 26–34)
MCHC: 35.2 gm/dl (ref 31–36)
MCV: 111.1 fl — ABNORMAL HIGH (ref 80–100)
MPV: 10.4 fl (ref 5.0–10.5)
Monocytes %: 10.4 % (ref 0.0–12.0)
Monocytes Absolute: 0.4 10*3 (ref 0.0–1.3)
Platelets: 101 10*3 — ABNORMAL LOW (ref 135–450)
RBC: 3.15 10*6 — ABNORMAL LOW (ref 4.2–5.9)
RDW: 16.1 % — ABNORMAL HIGH (ref 11.5–14.5)
Segs Relative: 41.6 % — ABNORMAL LOW (ref 42.0–63.0)
WBC: 3.5 10*3 — ABNORMAL LOW (ref 4.0–11.0)

## 2011-02-11 LAB — PROTIME-INR
INR: 1.01
Protime: 11.5 s

## 2011-02-11 LAB — MAGNESIUM: Magnesium: 2 mg/dl (ref 1.8–2.4)

## 2011-04-05 MED ORDER — FOLIC ACID 1 MG PO TABS
1 MG | ORAL_TABLET | Freq: Every day | ORAL | Status: DC
Start: 2011-04-05 — End: 2012-08-08

## 2011-04-05 NOTE — Telephone Encounter (Signed)
From: Sherral Hammers  To: Skipper Cliche, MD  Sent: 04/04/2011 3:19 PM EST  Subject: Medication Renewal Request    Original authorizing provider: Skipper Cliche, MD    Sherral Hammers would like a refill of the following medications:  folic acid (FOLVITE) 1 MG tablet Skipper Cliche, MD]    Preferred pharmacy: [facility] - [place], [place] - [address] - P [phone] - F [phone]    Comment:

## 2011-04-11 MED ORDER — FOLIC ACID 1 MG PO TABS
1 MG | ORAL_TABLET | ORAL | Status: DC
Start: 2011-04-11 — End: 2011-07-08

## 2011-04-12 NOTE — Telephone Encounter (Signed)
Pt requested a refill for Folic Acid and he received a message from [facility] and that med is not covered.  Pt stated he can buy it otc and does not need it refilled.

## 2011-04-23 MED ORDER — FUROSEMIDE 40 MG PO TABS
40 MG | ORAL_TABLET | Freq: Every day | ORAL | Status: DC
Start: 2011-04-23 — End: 2011-10-10

## 2011-04-23 NOTE — Telephone Encounter (Signed)
Filled per dr Frecka

## 2011-05-15 LAB — COMPREHENSIVE METABOLIC PANEL
ALT: 45 U/L — ABNORMAL HIGH (ref 10–40)
AST: 63 U/L — ABNORMAL HIGH (ref 15–37)
Albumin/Globulin Ratio: 0.9 — ABNORMAL LOW (ref 1.1–2.2)
Albumin: 2.8 gm/dl — ABNORMAL LOW (ref 3.4–5.0)
Alkaline Phosphatase: 114 U/L (ref 45–129)
BUN: 23 mg/dl — ABNORMAL HIGH (ref 7–18)
CO2: 28 mEq/L (ref 21–32)
Calcium: 8.6 mg/dl (ref 8.3–10.6)
Chloride: 108 mEq/L (ref 99–110)
Creatinine: 0.8 mg/dl (ref 0.8–1.3)
GFR Est, African/Amer: >60
GFR, Estimated: >60 (ref >60)
Glucose: 92 mg/dl (ref 70–99)
Potassium: 4 mEq/L (ref 3.5–5.1)
Sodium: 140 mEq/L (ref 136–145)
Total Bilirubin: 1.4 mg/dl — ABNORMAL HIGH (ref 0.0–1.0)
Total Protein: 6 gm/dl — ABNORMAL LOW (ref 6.4–8.2)

## 2011-05-15 LAB — CBC WITH AUTO DIFFERENTIAL
Basophils %: 0.5 % (ref 0.0–2.0)
Basophils Absolute: 0 10*3 (ref 0.0–0.2)
Eosinophils %: 2.9 % (ref 0.0–5.0)
Eosinophils Absolute: 0.1 10*3 (ref 0.0–0.6)
Granulocyte Absolute Count: 1.5 10*3 — ABNORMAL LOW (ref 1.7–7.7)
Hematocrit: 36.6 % — ABNORMAL LOW (ref 40.5–52.5)
Hemoglobin: 12.7 gm/dl — ABNORMAL LOW (ref 13.5–17.5)
Lymphocytes %: 41 % — ABNORMAL HIGH (ref 25.0–40.0)
Lymphocytes Absolute: 1.4 10*3 (ref 1.0–5.1)
MCH: 38 pg — ABNORMAL HIGH (ref 26–34)
MCHC: 34.6 gm/dl (ref 31–36)
MCV: 109.8 fl — ABNORMAL HIGH (ref 80–100)
MPV: 10 fl (ref 5.0–10.5)
Monocytes %: 13 % — ABNORMAL HIGH (ref 0.0–12.0)
Monocytes Absolute: 0.5 10*3 (ref 0.0–1.3)
Platelets: 96 10*3 — ABNORMAL LOW (ref 135–450)
RBC: 3.34 10*6 — ABNORMAL LOW (ref 4.2–5.9)
RDW: 16.5 % — ABNORMAL HIGH (ref 11.5–14.5)
Segs Relative: 42.6 % (ref 42.0–63.0)
WBC: 3.5 10*3 — ABNORMAL LOW (ref 4.0–11.0)

## 2011-05-15 LAB — MAGNESIUM: Magnesium: 2.1 mg/dl (ref 1.8–2.4)

## 2011-05-15 LAB — PROTIME-INR
INR: 1.04
Protime: 11.8 s (ref 10.0–12.8)

## 2011-05-16 LAB — AFP TUMOR MARKER: AFP-Tumor Marker: 1 ng/ml (ref 0–9)

## 2011-07-08 NOTE — Progress Notes (Signed)
1. Hypertension   The treatment plan is effective; continue the current therapy.       2. Erectile dysfunction   Samples provided      - Testosterone free and total male            Subjective      Chief Complaint   Patient presents with   . 6 Month Follow-Up      Hypertension  Here for follow up.   Compliant with medications. No side effects or pertinent symptoms reported.     Home BP readings: none    New problem(s):   Trouble with edema lle.       Review of Systems     Pertinent symptoms reported in HPI.   Objective     BP 110/70  Pulse 68  Ht 5' 7.5" (1.715 m)  Wt 207 lb (93.895 kg)  BMI 31.92 kg/m2    Wt Readings from Last 3 Encounters:   07/08/11 207 lb (93.895 kg)   01/04/11 201 lb (91.173 kg)   06/30/10 213 lb (96.616 kg)      BP Readings from Last 3 Encounters:   07/08/11 110/70   01/04/11 124/70   10/30/10 127/71         Physical Exam     General: Alert, well appearing, and in no distress.   Cardiovascular: normal rate, regular rhythm, normal S1, S2, no murmurs, rubs, clicks or gallops, 1+ LLE peripheral edema.

## 2011-07-28 MED ORDER — AMILORIDE HCL 5 MG PO TABS
5 MG | ORAL_TABLET | ORAL | Status: DC
Start: 2011-07-28 — End: 2012-03-31

## 2011-07-28 NOTE — Telephone Encounter (Signed)
Please advise do not see in current med list

## 2011-08-18 NOTE — Telephone Encounter (Signed)
Patient states that he received an e-mail regarding not getting his labs done.  He states that @ his last office visit, Dr. [name] gave him the option of getting his Testosterone checked if he wanted to.  Patient states that he is deciding whether or not he wants to get this done.  Patient can be reached @ phone # provided should there be any questions.

## 2011-09-01 MED ORDER — HYDROCODONE-ACETAMINOPHEN 5-325 MG PO TABS
5-325 MG | ORAL_TABLET | ORAL | Status: DC
Start: 2011-09-01 — End: 2011-10-13

## 2011-09-01 NOTE — Telephone Encounter (Signed)
Patient states that beginning yesterday he began having Kidney pain. He states that he has a history of kidney stones.  He states that he had left over Vicoprofen from having had a broken rib, and he took that this morning and it seemed to really help.  He is requesting that Vicoprofen (generic) please be called in to:  [facility]  [phone]  Appointment Offered and Refused. He stated that he doesn't want to come in and pay a co-pay.  Please verify with patient @ phone # provided.  Thanks!

## 2011-09-01 NOTE — Telephone Encounter (Signed)
Spoke with patient and notified him rx has been called in to preferred pharmacy.

## 2011-09-01 NOTE — Telephone Encounter (Signed)
vicodin 10 of them. Come in if not resolving

## 2011-09-27 MED ORDER — LACTULOSE 10 GM/15ML PO SOLN
10 GM/15ML | ORAL | Status: DC
Start: 2011-09-27 — End: 2012-03-04

## 2011-10-10 MED ORDER — FUROSEMIDE 40 MG PO TABS
40 MG | ORAL_TABLET | ORAL | Status: DC
Start: 2011-10-10 — End: 2012-08-29

## 2011-10-13 MED ORDER — HYDROCODONE-ACETAMINOPHEN 5-325 MG PO TABS
5-325 MG | ORAL_TABLET | ORAL | Status: DC
Start: 2011-10-13 — End: 2011-10-20

## 2011-10-13 MED ORDER — PREDNISONE 10 MG PO TABS
10 MG | ORAL_TABLET | ORAL | Status: AC
Start: 2011-10-13 — End: 2011-10-25

## 2011-10-13 NOTE — Progress Notes (Signed)
1. Sciatica   - predniSONE (DELTASONE) 10 MG tablet; Take 4 daily for 3 days, 3 for 3 days, 2 for 3 days, 1 for 3 days, then stop.  Dispense: 30 tablet; Refill: 0   - HYDROcodone-acetaminophen (NORCO) 5-325 MG per tablet; Take one tablet every 6-8 hours as needed for pain.  Dispense: 15 tablet; Refill: 0             Chief Complaint   Patient presents with   ??? Back Pain     patient having pain in lowest part of back going into buttocks all sxs x 1 month      Left leg pain started a month ago mostly in buttock and now other side also. Pain with rising from seated. And climbing steps. No pain sitting or laying.   Using vicoprofen occ for this. No leg weakness. Some lumbar pain   No history of this in the past.     Review of Systems    Pertinent symptoms reported in HPI.       BP 112/64   Ht 5' 7.5" (1.715 m)   Wt 204 lb (92.534 kg)   BMI 31.46 kg/m2    Wt Readings from Last 3 Encounters:   10/13/11 204 lb (92.534 kg)   07/08/11 207 lb (93.895 kg)   01/04/11 201 lb (91.173 kg)      BP Readings from Last 3 Encounters:   10/13/11 112/64   07/08/11 110/70   01/04/11 124/70         Physical Exam    Neg str leg raise no weakness

## 2011-10-19 NOTE — Telephone Encounter (Signed)
From: Sherral Hammers  To: Skipper Cliche, MD  Sent: 10/19/2011 12:42 PM EDT  Subject: Non-Urgent Medical Question    Dr [name], I'm not seeing much improvement with the medication prescribed at my last office visit. Should I make an appointment to see you? Are Tests needed? Please advise.    Andrew [name].

## 2011-10-20 ENCOUNTER — Encounter

## 2011-10-20 MED ORDER — HYDROCODONE-ACETAMINOPHEN 10-325 MG PO TABS
10-325 MG | ORAL_TABLET | Freq: Three times a day (TID) | ORAL | Status: AC | PRN
Start: 2011-10-20 — End: 2011-10-27

## 2011-10-20 NOTE — Telephone Encounter (Signed)
From: Andrew [name]  To: Skipper Cliche, MD  Sent: 10/20/2011 11:38 AM EDT  Subject: Non-Urgent Medical Question    Dr. [name], if I'm going to give it another week to see if improvement is made, I'm going to need another prescription for pain relief. If you prescribe more pain medication, could you make it a little stronger than the one I have now?  (HYDROCO/ACETAMINOP 5-325 MG TAB). [name] Please.  Andrew [name].

## 2011-10-20 NOTE — Telephone Encounter (Signed)
Please phone in vicodin I just ordered

## 2011-11-01 MED ORDER — TRAMADOL HCL 50 MG PO TABS
50 MG | ORAL_TABLET | Freq: Three times a day (TID) | ORAL | Status: DC | PRN
Start: 2011-11-01 — End: 2011-11-30

## 2011-11-01 NOTE — Progress Notes (Signed)
1. Lumbar radiculopathy              Persistent sxs despite conservative management. Evaluate for disc process and consider ESI   - MRI Lumbar Spine WO Contrast   - traMADol (ULTRAM) 50 MG tablet; Take 1 tablet by mouth every 8 hours as needed for Pain.  Dispense: 50 tablet; Refill: 0             Chief Complaint   Patient presents with   ??? Back Pain     f/u from last visit, still having pain.     Still having pain similar to last visit.  More so on the right side now. Started walking and feeling worse. Persistent pain when tries to rise from sitting. Pain climbing stairs. Ok when [employer].   No neuro sxs.   Steroid no benefit. Used vicodin. And avoiding nsaid due to liver disease.   Started in July with walking on beach.     Review of Systems    Pertinent symptoms reported in HPI.       BP 110/74   Pulse 64   Ht 5' 7.5" (1.715 m)   Wt 199 lb (90.266 kg)   BMI 30.69 kg/m2    Wt Readings from Last 3 Encounters:   11/01/11 199 lb (90.266 kg)   10/13/11 204 lb (92.534 kg)   07/08/11 207 lb (93.895 kg)      BP Readings from Last 3 Encounters:   11/01/11 110/74   10/13/11 112/64   07/08/11 110/70         Physical Exam    Normal motor strength. Legs.

## 2011-11-05 NOTE — Telephone Encounter (Signed)
From: Andrew [name]  To: Andrew Cliche, MD  Sent: 11/05/2011 12:39 PM EDT  Subject: Non-Urgent Medical Question    Dr. [name], I am feeling much better now. The pain that I was having feels only like a mild soreness now. My question for you is, should I proceed with the MRI or cancel it? Because of the progress that I've made, I feel like I should cancel it. Please give me your opinion. I will go with your recomendation. Thanks!    Andrew [name].

## 2011-11-16 ENCOUNTER — Emergency Department: Payer: Self-pay | Admitting: Emergency Medicine

## 2011-11-18 MED ORDER — NADOLOL 20 MG PO TABS
20 MG | ORAL_TABLET | ORAL | Status: DC
Start: 2011-11-18 — End: 2012-07-18

## 2011-11-18 NOTE — Telephone Encounter (Signed)
Last appt was for acute visit. Okay for refill?

## 2011-11-19 LAB — COMPREHENSIVE METABOLIC PANEL
ALT: 34 U/L (ref 10–40)
AST: 53 U/L — ABNORMAL HIGH (ref 15–37)
Albumin/Globulin Ratio: 0.8 — ABNORMAL LOW (ref 1.1–2.2)
Albumin: 2.8 g/dL — ABNORMAL LOW (ref 3.4–5.0)
Alkaline Phosphatase: 212 U/L — ABNORMAL HIGH (ref 45–129)
BUN: 17 mg/dL (ref 7–18)
CO2: 29 mEq/L (ref 21–32)
Calcium: 8.8 mg/dL (ref 8.3–10.6)
Chloride: 110 mEq/L (ref 99–110)
Creatinine: 0.9 mg/dL (ref 0.8–1.3)
GFR African American: >60 (ref >60)
GFR Non-African American: >60 (ref >60)
Globulin: 3 g/dL
Glucose: 96 mg/dL (ref 70–99)
Potassium: 4.8 mEq/L (ref 3.5–5.1)
Sodium: 143 mEq/L (ref 136–145)
Total Bilirubin: 1.4 mg/dL — ABNORMAL HIGH (ref 0.00–1.00)
Total Protein: 6.1 g/dL — ABNORMAL LOW (ref 6.4–8.2)

## 2011-11-19 LAB — PROTIME-INR
INR: 1.14 (ref 0.85–1.15)
Protime: 12.7 s (ref 10.0–12.8)

## 2011-11-19 LAB — MAGNESIUM: Magnesium: 2 mg/dL (ref 1.8–2.4)

## 2011-11-21 LAB — AFP TUMOR MARKER: AFP (Alpha Fetoprotein): 2 ng/mL (ref 0–9)

## 2011-11-22 LAB — CBC WITH AUTO DIFFERENTIAL
Basophils %: 0.5 %
Basophils Absolute: 0 10*3/uL (ref 0.0–0.2)
Eosinophils %: 1.9 %
Eosinophils Absolute: 0.1 10*3/uL (ref 0.0–0.6)
Hematocrit: 37.3 % — ABNORMAL LOW (ref 40.5–52.5)
Hemoglobin: 12.7 g/dL — ABNORMAL LOW (ref 13.5–17.5)
Lymphocytes %: 34 %
Lymphocytes Absolute: 1.2 10*3/uL (ref 1.0–5.1)
MCH: 37.8 pg — ABNORMAL HIGH (ref 26.0–34.0)
MCHC: 34 g/dL (ref 31.0–36.0)
MCV: 111.1 fL — ABNORMAL HIGH (ref 80.0–100.0)
MPV: 9.5 fL (ref 5.0–10.5)
Monocytes %: 11.8 %
Monocytes Absolute: 0.4 10*3/uL (ref 0.0–1.3)
Neutrophils %: 51.8 %
Neutrophils Absolute: 1.8 10*3/uL (ref 1.7–7.7)
Platelets: 127 10*3/uL — ABNORMAL LOW (ref 135–450)
RBC: 3.36 M/uL — ABNORMAL LOW (ref 4.20–5.90)
RDW: 16 % — ABNORMAL HIGH (ref 12.4–15.4)
WBC: 3.5 10*3/uL — ABNORMAL LOW (ref 4.0–11.0)

## 2011-11-30 ENCOUNTER — Encounter

## 2011-11-30 MED ORDER — OXYCODONE HCL 5 MG PO TABS
5 MG | ORAL_TABLET | Freq: Three times a day (TID) | ORAL | Status: DC | PRN
Start: 2011-11-30 — End: 2011-12-09

## 2011-11-30 NOTE — Progress Notes (Signed)
1. Back pain  Suspect disc disease, not likely nephrolithiasis. Cautious pain med use. Increase lactulose to get bowels moving. Call if not effective. May need mri scan.  Or renal eval.   - POCT Urinalysis no Micro  - XR Lumbar Spine Limited  - XR Thoracic Spine Limited  - oxyCODONE (ROXICODONE) 5 MG immediate release tablet; Take 1 tablet by mouth every 8 hours as needed for Pain for 7 days.  Dispense: 30 tablet; Refill: 0             Chief Complaint   Patient presents with   ??? Back Pain     having lower back pain, loss of appetite. patient is constipated      Pain started hurting again 3 days ago. Mid back pain, difficulty urinating and constipated past two days also. No hematuria. No abdominal pain  Not radiating into legs. No pain if lays still.   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    Review of Systems    Pertinent symptoms reported in HPI.       BP 110/62   Pulse 74   Ht 5' 7.5" (1.715 m)   Wt 205 lb 12.8 oz (93.35 kg)   BMI 31.74 kg/m2    Wt Readings from Last 3 Encounters:   11/30/11 205 lb 12.8 oz (93.35 kg)   11/01/11 199 lb (90.266 kg)   10/13/11 204 lb (92.534 kg)      BP Readings from Last 3 Encounters:   11/30/11 110/62   11/01/11 110/74   10/13/11 112/64         Physical Exam    Spine exam: limited range of motion, pain with motion noted during exam, negative straight-leg raise bilaterally , normal reflexes and strength bilateral lower extremities.  No tenderness  Abdominal exam: Normal bowel sounds, soft, nontender, nondistended, no masses or organomegaly.

## 2011-12-01 ENCOUNTER — Encounter

## 2011-12-03 ENCOUNTER — Encounter

## 2011-12-03 LAB — CBC WITH AUTO DIFFERENTIAL
Basophils %: 0.2 %
Basophils Absolute: 0 10*3/uL (ref 0.0–0.2)
Eosinophils %: 0.8 %
Eosinophils Absolute: 0.1 10*3/uL (ref 0.0–0.6)
Hematocrit: 36.5 % — ABNORMAL LOW (ref 40.5–52.5)
Hemoglobin: 12.1 g/dL — ABNORMAL LOW (ref 13.5–17.5)
Lymphocytes %: 18.2 %
Lymphocytes Absolute: 1.4 10*3/uL (ref 1.0–5.1)
MCH: 37.2 pg — ABNORMAL HIGH (ref 26.0–34.0)
MCHC: 33.2 g/dL (ref 31.0–36.0)
MCV: 111.9 fL — ABNORMAL HIGH (ref 80.0–100.0)
MPV: 9.7 fL (ref 5.0–10.5)
Monocytes %: 11 %
Monocytes Absolute: 0.8 10*3/uL (ref 0.0–1.3)
Neutrophils %: 69.8 %
Neutrophils Absolute: 5.2 10*3/uL (ref 1.7–7.7)
Platelets: 115 10*3/uL — ABNORMAL LOW (ref 135–450)
RBC: 3.26 M/uL — ABNORMAL LOW (ref 4.20–5.90)
RDW: 16.7 % — ABNORMAL HIGH (ref 12.4–15.4)
WBC: 7.5 10*3/uL (ref 4.0–11.0)

## 2011-12-03 LAB — PROTEIN ELECTROPHORESIS, URINE
Protein, Ur: 0.004 g/dL (ref 0.000–0.012)
Protein, Ur: 4.18 mg/dL (ref 0.00–12.00)

## 2011-12-03 LAB — ELECTROPHORESIS PROTEIN, SERUM
Albumin: 2.6 g/dL — ABNORMAL LOW (ref 3.0–5.1)
Alpha-1-Globulin: 0.1 g/dL (ref 0.1–0.4)
Alpha-2-Globulin: 0.7 g/dL (ref 0.6–1.2)
Beta Globulin: 0.6 g/dL — ABNORMAL LOW (ref 0.7–1.3)
Gamma Globulin: 1.9 g/dL (ref 0.6–2.0)
Total Protein: 5.8 g/dL — ABNORMAL LOW (ref 6.4–8.2)

## 2011-12-03 NOTE — Telephone Encounter (Signed)
From: Skipper Cliche, MD  To: Andrew [name]  Sent: 12/03/2011 7:41 AM EDT  Subject: tests    Nothing new with the blood tests so far.  I would like you to have a bone density test done to evaluate for osteoporosis.  Where would you like to go for that? We'll send them an order and they'll call to schedule.  Skipper Cliche MD

## 2011-12-06 LAB — PATH INTERP ELEC URINE

## 2011-12-07 ENCOUNTER — Encounter

## 2011-12-09 ENCOUNTER — Encounter

## 2011-12-09 MED ORDER — OXYCODONE HCL 5 MG PO TABS
5 MG | ORAL_TABLET | Freq: Three times a day (TID) | ORAL | Status: AC | PRN
Start: 2011-12-09 — End: 2011-12-16

## 2011-12-09 MED ORDER — CALCITONIN (SALMON) 200 UNIT/ACT NA SOLN
200 UNIT/ACT | Freq: Every day | NASAL | Status: DC
Start: 2011-12-09 — End: 2012-01-17

## 2011-12-09 MED ORDER — OXYCODONE HCL 5 MG PO TABS
5 MG | ORAL_TABLET | Freq: Three times a day (TID) | ORAL | Status: DC | PRN
Start: 2011-12-09 — End: 2011-12-09

## 2012-01-17 NOTE — Patient Instructions (Signed)
Your Preventive Recommendations for 01/17/2012  Please refer to Mychart Preventive Care/Immunizations sections for testing and vaccine due dates.                  Summary of Current Preventive Recommendations  Health Maintenance   Topic Date Due   ??? Flu Vaccine Yearly (Adult)  11/07/2011   ??? Psa Counseling  01/04/2012   ??? Colon Cancer Screening Colonoscopy  06/11/2015   ??? Tetanus Vaccine Adult (11 Years And Up)  01/06/2020   ??? Zostavax Vaccine  Addressed   ??? Pneumovax Vaccine Adult  Completed        ??   Diabetes Screen      Repeat every 3 years ??   Lipids (Cholesterol)  Repeat every 3 years not due ??   Daily Aspirin Use      Contraindicated due to low platelets ??   Exercise   Try to exercise for at least 150 minutes per week. ??   Weight    Your Body mass index is 30.84 kg/(m^2).         Your BMI is 25 or greater, which indicates that you are overweight. Work on 10 lb weight loss ??   Repeat a flu shot yearly in the fall        Health risks identified from your wellness visit and risk assessment include ??   Falls risk Referral provided for Physical Therapy if desired       Other Medical Conditions  Addressed     Hypertension  The treatment plan is effective; continue the current therapy.      Spinal compression fracture  Consider physical therapy. You can resume normal activities as tolerated.

## 2012-01-17 NOTE — Progress Notes (Signed)
Andrew [name]    Annual Wellness Visit     BD: Mar 19, 1940          DOS: 01/17/2012  Personalized Preventive Plan                          Your Preventive Recommendations for 01/17/2012  Please refer to Mychart Preventive Care/Immunizations sections for testing and vaccine due dates.                  Summary of Current Preventive Recommendations  Health Maintenance   Topic Date Due   ??? Flu Vaccine Yearly (Adult)  11/07/2011   ??? Psa Counseling  01/16/2013   ??? Colon Cancer Screening Colonoscopy  06/11/2015   ??? Tetanus Vaccine Adult (11 Years And Up)  01/06/2020   ??? Zostavax Vaccine  Addressed   ??? Pneumovax Vaccine Adult  Completed        ??   Diabetes Screen      Repeat every 3 years ??   Lipids (Cholesterol)  Repeat every 3 years not due ??   Daily Aspirin Use      Contraindicated due to low platelets ??   Exercise   Try to exercise for at least 150 minutes per week. ??   Weight    Your Body mass index is 30.84 kg/(m^2).         Your BMI is 25 or greater, which indicates that you are overweight. Work on 10 lb weight loss ??   Repeat a flu shot yearly in the fall        Health risks identified from your wellness visit and risk assessment include ??   Falls risk Referral provided for Physical Therapy if desired       Other Medical Conditions  Addressed     Hypertension  The treatment plan is effective; continue the current therapy.      Spinal compression fracture  Consider physical therapy. You can resume normal activities as tolerated.                 Return in about 6 months (around 07/16/2012) for Hypertension.          Here for an [employer].    AWV Exam  Written HRA completed by patient and reviewed.     Current Providers  Patient Care Team:  Skipper Cliche, MD as PCP - General (Internal Medicine)  Suella Grove, MD as Consulting Physician (Gastroenterology)  Kristine Linea, MD as Consulting Physician (Gastroenterology)    Cognitive Function Assessment  Normal    Depression Screening   PHQ-2 Results Score:  0  Results tabulated from HRA.   Current or previous history of mood disorder or depression: No      Other Risk factors identified per HRA/History are detailed in the Preventive Plan above.     Medical conditions and problems addressed:    Patient Active Problem List   Diagnosis   ??? Hypertension  Compliant with medications. No side effects or pertinent symptoms reported.    ??? Erectile dysfunction   ??? Thrombocytopenia   ??? Edema   ??? BPH (benign prostatic hyperplasia)   ??? Alpha-1-antitrypsin deficiency   ??? Cirrhosis  Treating for h pylori   ??? Spinal compression fracture  Evaluated.improved. No further pain meds.       Urinary symptoms: incomplete emptying.      Review of Systems     A comprehensive review of systems  was negative except for:  ?? none    Objective     BP 110/62   Pulse 76   Ht 5' 7.5" (1.715 m)   Wt 200 lb (90.719 kg)   BMI 30.84 kg/m2    Wt Readings from Last 3 Encounters:   01/17/12 200 lb (90.719 kg)   11/30/11 205 lb 12.8 oz (93.35 kg)   11/01/11 199 lb (90.266 kg)      BP Readings from Last 3 Encounters:   01/17/12 110/62   11/30/11 110/62   11/01/11 110/74            Physical Exam     Constitutional:  Appears well-developed and well-nourished. No acute distress.  Eyes: Sclera white, conjunctiva clear. No lid lag. PERRL  Mouth: Oropharynx is clear and moist. No lesions evident.  Neck: Full ROM, trachea midline. No thyromegaly present. No cervical adenopathy  Cardiovascular: Normal rate, regular rhythm and normal heart sounds.    No murmur heard. No peripheral edema. No carotid bruit.  Resp: Effort normal and breath sounds normal. No respiratory distress. There are no wheezes or rales.   GI: Normal bowel sounds, abdomen soft nontender without masses or hepatosplenomegaly   Musk: Gait coordinated and smooth. No clubbing or cyanosis.  Skin: Warm and dry normal turgor. No rashes, lesions or ulcers.  Neuro: Cranial nerves are normal.Mental status normal.    Gait normal.  Cerebellar function is normal.    Motor function of extremities symmetric and without weakness.   Psychiatric:Patient is alert and oriented to person, place, and time.  Normal mood and affect.   GU: rectal normal, no masses, prostate normal in size without masses or nodules, guaiac negative stool             Risks identified and results from online HRA   Timestamp 01/06/2012 18:06:12   Your Initials LL   Your birth date April 23, 1940   In general, would you say your health is: Good   In the past 30 days have you used tobacco? No   Average amount smoked/used per day:    Are you interested in stopping smoking at this time?    How often do you have a drink containing alcohol? Never   How many drinks do you have on a typical day when you are drinking?  Audit Score  How often do you have five or more drinks in a day? Never 2  Have you seen a dentist in the past year? Yes   Do you have a living will? Yes   Do you exercise for at least 20 minutes at least three times per week? No, I do not exercise this much   Have you lost any weight without trying in the past three months? No   In the past 2 weeks, how much have you been bothered by anxiety, worry or stress? Not at all Anxiety Score  In the past 2 weeks, how often have you been bothered because you were not able to stop or control worrying? Not at all 0  In the past 2 weeks, how often have you felt down, depressed, or hopeless? Almost never Depression Score  In the past 2 weeks, how often have you felt little interest or pleasure in doing things? Some of the time 0  Who do you live with? Wife   How often do you get the social and emotional support that you need? Always   In the past 7 days, how would you rate your  average pain level? 2   Do you or your family notice any trouble with your hearing? Yes   Do you have working smoke detectors? Yes   Do you always fasten your seat belt when you are in a car? Yes   In the past 7 days, did you need help performing any of these activities?    In the past 7 days,  did you need help from others to take care of any of these activities?    Have you fallen two or more times in the past year? Yes   Please list here any other physicians, medical providers, or medical supply providers you have seen or used over the past year: Dr. Geannie Risen

## 2012-01-17 NOTE — Progress Notes (Signed)
Medicare AWV MA Note    Mini-Cog Screen:  Give patient the following words to remember and ask to repeat- advise patient that he will be asked to recall items later:  APPLE, PENNY, WATCH  Clock Drawing Test (CDT): Have patient draw the face of a clock and put the numbers in the correct positions. Then draw in the hands at ten minutes after eleven.  Score clock drawing test as "normal" if the patient places the correct time and the clock appears grossly normal, or "abnormal."  The patient is then asked to recall the three words. (This should be done after one minute, or after the Clock Drawing Test is complete)    Mini-Cog Scoring: 3/3 words recalled (if score is 0 or 3, do not need to score CDT);  CDT: Not Indicated     MINI-COG INTERPRETATION: 3- Negative screen for dementia

## 2012-02-28 MED ORDER — HYDROCODONE-ACETAMINOPHEN 5-325 MG PO TABS
5-325 MG | ORAL_TABLET | Freq: Four times a day (QID) | ORAL | Status: DC | PRN
Start: 2012-02-28 — End: 2012-03-14

## 2012-02-28 NOTE — Telephone Encounter (Signed)
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 Some times more intense than others. The pain has never gone away. I'm requesting pills for the pain. If you can write me a prescription for the pain I would appreciate it. If you can, phone it in to [facility] in [place]. Thanks!    Andrew [name].

## 2012-02-28 NOTE — Telephone Encounter (Signed)
Faxed .

## 2012-03-03 NOTE — Telephone Encounter (Signed)
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 You prescribed HYDROCO/ACETAMINOP 5-325 MG TAB. The instructions that came with it says that it contains ACETAMINOPHEN. Instructions also says that it may harm my liver. Considering my liver problem, is it safe for me to use? Please advise.  Thank You, Sherral Hammers ([birth year])

## 2012-03-04 MED ORDER — LACTULOSE 10 GM/15ML PO SOLN
10 GM/15ML | ORAL | Status: DC
Start: 2012-03-04 — End: 2013-06-12

## 2012-03-14 MED ORDER — HYDROCODONE-ACETAMINOPHEN 5-325 MG PO TABS
5-325 MG | ORAL_TABLET | Freq: Four times a day (QID) | ORAL | Status: DC | PRN
Start: 2012-03-14 — End: 2012-04-18

## 2012-03-31 MED ORDER — AMILORIDE HCL 5 MG PO TABS
5 MG | ORAL_TABLET | ORAL | Status: DC
Start: 2012-03-31 — End: 2012-09-05

## 2012-04-18 MED ORDER — HYDROCODONE-ACETAMINOPHEN 5-325 MG PO TABS
5-325 MG | ORAL_TABLET | Freq: Four times a day (QID) | ORAL | Status: DC | PRN
Start: 2012-04-18 — End: 2012-08-08

## 2012-04-18 NOTE — Telephone Encounter (Signed)
Needs to make appt here to reevaluate pain

## 2012-04-18 NOTE — Telephone Encounter (Signed)
From: Sherral Hammers  To: Skipper Cliche, MD  Sent: 04/18/2012 1:55 PM EST  Subject: Medication Renewal Request    Original authorizing provider: Skipper Cliche, MD    Sherral Hammers would like a refill of the following medications:  HYDROcodone-acetaminophen (NORCO) 5-325 MG per tablet Skipper Cliche, MD]    Preferred pharmacy: [facility] [address], [place] - [address] [facility] - P [phone] - F [phone]    Comment:

## 2012-04-18 NOTE — Telephone Encounter (Signed)
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 I also would like to set up an appointment to discuss my continuing pain problem. Do I need to talk to an assistant to set up an appointment? Please advise. Thanks!    Milus Mallick.

## 2012-04-19 MED ORDER — TRAMADOL HCL 50 MG PO TABS
50 MG | ORAL_TABLET | Freq: Two times a day (BID) | ORAL | Status: DC | PRN
Start: 2012-04-19 — End: 2012-05-24

## 2012-04-19 NOTE — Patient Instructions (Signed)
Today's Plan  1. Spinal compression fracture  Try the tramadol for pain as needed. And [facility] will call to schedule the MRI  - traMADol (ULTRAM) 50 MG tablet; Take 1 tablet by mouth every 12 hours as needed for Pain.  Dispense: 50 tablet; Refill: 0  - MRI lumbar spine without contrast; Future

## 2012-04-19 NOTE — Progress Notes (Signed)
Assessment/Plan    1. Spinal compression fracture  - traMADol (ULTRAM) 50 MG tablet; Take 1 tablet by mouth every 12 hours as needed for Pain.  Dispense: 50 tablet; Refill: 0  - MRI lumbar spine without contrast; Future          Subjective     Chief Complaint   Patient presents with   ??? Back Pain     Still with persistent pain in back pain with raising up from seated worse with slow movement. Possibly more on right lower back. Not occurring at rest in bed. Laying on back was painful prev. Has improved.   Just using pain medication for it.   No neuro sxs.    Review of Systems     Pertinent symptoms reported in HPI.     Physical Exam     BP 120/84   Pulse 68   Ht 5' 7.5" (1.715 m)   Wt 200 lb (90.719 kg)   BMI 30.84 kg/m2    Wt Readings from Last 3 Encounters:   04/19/12 200 lb (90.719 kg)   01/17/12 200 lb (90.719 kg)   11/30/11 205 lb 12.8 oz (93.35 kg)    BP Readings from Last 3 Encounters:   04/19/12 120/84   01/17/12 110/62   11/30/11 110/62         No tenderness to spine. Normal rom and no spasm

## 2012-04-28 ENCOUNTER — Encounter

## 2012-04-28 NOTE — Telephone Encounter (Signed)
I'm trying to sign orders it states medicare does not cover please advise.

## 2012-04-28 NOTE — Telephone Encounter (Signed)
Message copied by Leodis Sias on Fri Apr 28, 2012  2:06 PM  ------       Message from: Skipper Cliche       Created: Fri Apr 28, 2012  2:03 PM         Please order cbc and cmp and vitamin d 25 and put in lab. Dx compression fracture spine  ------

## 2012-05-02 ENCOUNTER — Encounter

## 2012-05-02 LAB — CBC WITH AUTO DIFFERENTIAL
Bands Relative: 2 % (ref 0–7)
Basophils %: 0 %
Basophils Absolute: 0 10*3/uL (ref 0.0–0.2)
Eosinophils %: 3 %
Eosinophils Absolute: 0.1 10*3/uL (ref 0.0–0.6)
Hematocrit: 35.7 % — ABNORMAL LOW (ref 40.5–52.5)
Hemoglobin: 12 g/dL — ABNORMAL LOW (ref 13.5–17.5)
Lymphocytes %: 47 %
Lymphocytes Absolute: 2.2 10*3/uL (ref 1.0–5.1)
MCH: 36.3 pg — ABNORMAL HIGH (ref 26.0–34.0)
MCHC: 33.7 g/dL (ref 31.0–36.0)
MCV: 108 fL — ABNORMAL HIGH (ref 80.0–100.0)
MPV: 9.8 fL (ref 5.0–10.5)
Monocytes %: 10 %
Monocytes Absolute: 0.5 10*3/uL (ref 0.0–1.3)
Neutrophils %: 38 %
Neutrophils Absolute: 1.8 10*3/uL (ref 1.7–7.7)
Platelets: 133 10*3/uL — ABNORMAL LOW (ref 135–450)
RBC: 3.31 M/uL — ABNORMAL LOW (ref 4.20–5.90)
RDW: 17 % — ABNORMAL HIGH (ref 12.4–15.4)
WBC: 4.6 10*3/uL (ref 4.0–11.0)

## 2012-05-02 LAB — COMPREHENSIVE METABOLIC PANEL
ALT: 33 U/L (ref 10–40)
AST: 68 U/L — ABNORMAL HIGH (ref 15–37)
Albumin/Globulin Ratio: 1 — ABNORMAL LOW (ref 1.1–2.2)
Albumin: 3 g/dL — ABNORMAL LOW (ref 3.4–5.0)
Alkaline Phosphatase: 129 U/L (ref 40–129)
BUN: 17 mg/dL (ref 7–20)
CO2: 27 mmol/L (ref 21–32)
Calcium: 8.8 mg/dL (ref 8.3–10.6)
Chloride: 106 mmol/L (ref 99–110)
Creatinine: 0.8 mg/dL (ref 0.8–1.3)
GFR African American: >60 (ref >60)
GFR Non-African American: >60 (ref >60)
Globulin: 3.1 g/dL
Glucose: 66 mg/dL — ABNORMAL LOW (ref 70–99)
Potassium: 4.7 mmol/L (ref 3.5–5.1)
Sodium: 142 mmol/L (ref 136–145)
Total Bilirubin: 1.1 mg/dL — ABNORMAL HIGH (ref 0.0–1.0)
Total Protein: 6.1 g/dL — ABNORMAL LOW (ref 6.4–8.2)

## 2012-05-02 NOTE — Telephone Encounter (Signed)
From: Sherral Hammers  To: Skipper Cliche, MD  Sent: 05/02/2012 11:35 AM EST  Subject: Non-Urgent Medical Question    Dr. [name], I came in today (Tuesday Feb. 25) for the blood tests that you requested. Also, I received a call from Dr. Marzella Schlein office today. I have an appointment scheduled for March 13 at 9:00 AM. Dr. Marzella Schlein asked that I bring a copy of my MRI (disk or film). Will your office get that copy for me or do I have to pick it up a [place] myself?    Andrew [name].

## 2012-05-04 LAB — VITAMIN D 25 HYDROXY: Vit D, 25-Hydroxy: 20 ng/mL — ABNORMAL LOW (ref 30–80)

## 2012-05-09 NOTE — Telephone Encounter (Signed)
From: Sherral Hammers  To: Skipper Cliche, MD  Sent: 05/09/2012 2:29 PM EST  Subject: Non-Urgent Medical Question     Dr. [name], turns out that Dr. Marzella Schlein is in my network. My appointment is scheduled for March thirteen.

## 2012-05-24 ENCOUNTER — Encounter

## 2012-05-24 MED ORDER — TRAMADOL HCL 50 MG PO TABS
50 MG | ORAL_TABLET | Freq: Two times a day (BID) | ORAL | Status: DC | PRN
Start: 2012-05-24 — End: 2012-07-03

## 2012-05-24 NOTE — Telephone Encounter (Signed)
From: Andrew [name]  To: Skipper Cliche, MD  Sent: 05/24/2012 11:15 AM EDT  Subject: Medication Renewal Request    Original authorizing provider: Skipper Cliche, MD    Andrew [name] would like a refill of the following medications:  traMADol (ULTRAM) 50 MG tablet Skipper Cliche, MD]    Preferred pharmacy: [facility] [address] [place] - [address] [facility] - P [phone] - F [phone]    Comment:  Dr. [name], I need a refill on traMAD 50 MG Tablet (90 pills). I use [name] [name].

## 2012-05-24 NOTE — Telephone Encounter (Signed)
Last ov:04/19/2012  Last refill: 04/19/2012

## 2012-06-14 NOTE — Plan of Care (Signed)
Outpatient Physical Therapy  Phone: [phone] Fax: [phone]     To: Referring Practitioner: Dr. Alison Stalling Rissover      Patient: Andrew [name]   DOB: 07-28-1940   MRN: [id number]  Evaluation Date: 06/14/2012      Diagnosis Information:  ?? Diagnosis: DDD, lumbar spondylosis, lumbar compression fx, idiopathic scoliosis   ?? Treatment Diagnosis: Low back pain     Physical Therapy Certification Form  Dear Dr. Katheran James  The following patient has been evaluated for physical therapy services and for therapy to continue, Medicare requires monthly physician review of the treatment plan. Please review the attached evaluation and/or summary of the patient's plan of care, and verify that you agree therapy should continue by signing the attached document and sending it back to our office.    Plan of Care/Treatment to date:  [x]  Therapeutic Exercise    [x]  Modalities:  [x]  Therapeutic Activity     [x]  Ultrasound  [x]  Electrical Stimulation  []  Gait Training      []  Cervical Traction []  Lumbar Traction  []  Neuromuscular Re-education    [x]  Cold/hotpack []  Iontophoresis   [x]  Instruction in HEP     Other:  [x]  Manual Therapy      []              [x]  Aquatic Therapy      []            ?      Frequency/Duration:  # Days per week: []  1 day # Weeks: []  1 week []  5 weeks     []  2 days?   []  2 weeks []  6 weeks     [x]  3 days   []  3 weeks []  7 weeks     []  4 days   []  4 weeks [x]  8 weeks    Rehab Potential: []  Excellent [x]  Good []  Fair  []  Poor       Electronically signed by:  Joylene Grapes, PT        If you have any questions or concerns, please don't hesitate to call.  Thank you for your referral.      Physician Signature:________________________________Date:__________________  By signing above, therapist???s plan is approved by physician

## 2012-06-14 NOTE — Progress Notes (Signed)
Physical Therapy  Initial Assessment  Date: 06/14/2012  Patient Name: Andrew [name]  MRN: [id number]  DOB: 1940/11/06     Treatment Diagnosis: Low back pain    Subjective   General  Chart Reviewed: Yes  Additional Pertinent Hx: HTN, Liver Sclerosis, Rib Fractures  Referring Practitioner: Dr. Alison Stalling Rissover  Referral Date : 06/08/12  Diagnosis: DDD, lumbar spondylosis, lumbar compression fx, idiopathic scoliosis  PT Visit Information  PT Insurance Information: [employer]    Onset:  Started having pain in July 2013 - insidious onset while on vacation - started in left leg then shifted to right leg; x-rays showed L1 compression fx which was healed; MRI showed L5-S1 left foraminal narrowing; PCP recommended pain management who did ESI a week ago; saw Dr. Katheran James who perscribed PT    PLOF: no prior history of back pain; lives with wife in 2 story home with bedroom on second floor; does some cleaning at home and yardwork; was walking 2.5 miles 5-6 days/week    CLOF:  able to climb stairs reciprocally since injection was done (was doing step to pattern); having difficulty standing up straight; needs to use BUEs to get up from chair; feels like sitting in recliner is the only comfortable position for him to be in - sleeping in recliner because can't find comfortable position in bed; has to use cart to support self while walking in grocery; stand tolerance 5 minutes; has to sit down to don pants/shoes; no lifting >10# per pt report    Pain:  located in central low back with tendency to be more towards the right; 1/0 at rest, 6/10 with activity; 0/10 currently - sharp, aching    Goals:  wants to be able to do yardwork (cut grass)     Objective  Observation/Palpation  Palpation: no pain with palpation  Observation: decreased lumbar lordosis with slight forward flexed posture, decreased segmental mobility throughout lumbar spine    Spine  Lumbar: inclinometer:  flexion 55 degrees, extension 16 degrees with discomfort,  side bend right 20 degrees, side bend left 15 degrees with discomfort    Strength RLE  Comment: L2-S1 myotomes strong  Strength LLE  Comment: L2-S1 myotomes strong     Additional Measures  Flexibility: Hamstring (90/90): lacking 10 degrees B; piriformis WNLs B, hip IR slightly limited at 90 degrees hip flexion  Special Tests: slump test negative B; SLR test negative B; SL long axis distraction no change B    Sensation  Overall Sensation Status: WNL     Outpatient fall risk assessment completed asking screening question if patient has fallen in the past 30 days:  [x]  Yes  []  No    Based on screen for falls, patient demonstrates fall risk:  []  Yes  [x]  No    Interventions based on fall risk status:  Updated Problem List within Medical History  []  Yes   [x]  N/A    Asked family to assist with increased observation of the patient  []  Yes   [x]  N/A    Patient kept in visible area when not closely supervised by therapist  []  Yes   [x]  N/A    Repeatedly reinforce activity limits and safety needs with patient/family  []  Yes   [x]  N/A    Increase frequency of rounding/monitoring patient  []  Yes   [x]  N/A      Assessment   Assessment  Assessment: Pt presents with decreased segmental mobility of lumbar spine resulting in decreased lumbar lordosis and inability  to tolerate upright postures for extended periods of time - would benefit from skilled PT services to improve posture and core strength/endurance  Treatment Diagnosis: Low back pain  Prognosis: Good  Requires PT Follow Up: Yes  Timed Code Treatment Minutes: 30 Minutes  Total Treatment Time: 65  Activity Tolerance  Activity Tolerance: Patient Tolerated treatment well  Plan  Plan: Plan of care initiated     Plan   Plan  Plan: Plan of care initiated  Progress Note  See Progress Note: Yes  Timed Code Treatment Minutes: 30 Minutes  Total Treatment Time: 65  Frequency and duration of tx  Days: 3  Weeks: 8    Goals  Long term goals  Time Frame for Long term goals : To be met in  8 weeks  Long term goal 1: Pt will improve lumbar segmental mobility to improve upright posture to be able to walk through grocery store without needing grocery cart  Long term goal 2: Pt will increase core strength to improve stand tolerance to >15 minutes to be able to tolerate walking at least 1 mile for exercise   Long term goal 3: Pt will report pain levels 2/10 or less to be able to resume PLOF     Mujahid Jalomo MARIE [place]  License and Documentation Cosign  Therapy License Number: PT, DPT [id number]

## 2012-06-14 NOTE — Other (Addendum)
Physical Therapy Daily Treatment Note  Date:  06/14/2012    Patient Name:  Andrew [name]    DOB:  1940-05-19  MRN: [id number]  Restrictions/Precautions:  L1 compression fx  Medical/Treatment Diagnosis Information:   ?? Diagnosis: DDD, lumbar spondylosis, lumbar compression fx, idiopathic scoliosis  ?? Treatment Diagnosis: Low back pain  Insurance/Certification information:  PT Insurance Information: [employer]  Physician Information:  Referring Practitioner: Dr. Alison Stalling Rissover  Plan of care signed (Y/N):  Faxed 06/14/12  Visit# / total visits:  1/1, 0/? (pending [employer] approval)  Pain level: 0/10     G-Code (if applicable):      Date / Visit # G-Code Applied:  /       Progress Note: [x]   Yes  []   No  Next due by: Visit #10      Subjective:   See eval    Objective:   Observation: see eval  Test measurements: see eval     Exercises:  Exercise/Equipment Resistance/Repetitions Other comments   Bike      IB/HR     Swiss Ball - seated     Cables Multifidus Walkouts  Hip 3 way SLR                                                         Other Therapeutic Activities:  Patient education on PT and plan of care including diagnosis, prognosis, treatment goals and options.  Educated pt using spine models regarding possible causes of pain.  Educated pt on use of heat vs ice and frequency/intensity of HEP.    Home Exercise Program:    06/14/12:  SKTC, LTR, anterior pelvic tilt - gave handout and pt performed 10 reps of each exercise this date    Manual Treatments:      Modalities:      Timed Code Treatment Minutes:  30    Total Treatment Minutes:  65    Treatment/Activity Tolerance:  [x]  Patient tolerated treatment well []  Patient limited by fatique  []  Patient limited by pain  []  Patient limited by other medical complications  []  Other:     Prognosis: [x]  Good []  Fair  []  Poor    Patient Requires Follow-up: [x]  Yes  []  No    Plan:   []  Continue per plan of care []  Alter current plan (see comments)  [x]  Plan of care  initiated []  Hold pending MD visit []  Discharge  Plan for Next Session:   flowsheet as above - core strengthening, postural education; modalities as needed; manual as needed    Electronically signed by:  Joylene Grapes, PT

## 2012-06-19 ENCOUNTER — Encounter: Payer: Self-pay | Admitting: Rheumatology

## 2012-06-19 NOTE — Other (Addendum)
Physical Therapy Daily Treatment Note  Date:  06/19/2012    Patient Name:  Andrew [name]    DOB:  12-20-1940  MRN: [id number]  Restrictions/Precautions:  L1 compression fx  Medical/Treatment Diagnosis Information:   ?? Diagnosis: DDD, lumbar spondylosis, lumbar compression fx, idiopathic scoliosis  ?? Treatment Diagnosis: Low back pain  Insurance/Certification information:   [employer]  Physician Information:   Dr. Alison Stalling Rissover  Plan of care signed (Y/N):  yes  Visit# / total visits:  1/1,  1/6 ([employer] until 5/24)  Pain level: 0/10     Progress Note: []   Yes  [x]   No  Next due by: Visit #10      Subjective:   Not having too much pain currently, only having soreness as if he was out working in the yard.  Able to perform HEP okay but states that the floor is too firm for LTR, advised pt to perform HEP on bed.    Objective:   Observation:   Test measurements:  None formally taken this date    Exercises:  Exercise/Equipment Resistance/Repetitions Other comments   Bike  5 min    IB/HR 2x30" / 2x10    Swiss Ball - seated Shifts:  A/P, M/L, CW & CCW circles x 12 each    Alt UE [place] x 10  Marches x 10   Manual cueing to B shoulders and thoracolumbar spine for correct technique   Cables Multifidus Walkouts 2 pl x 3 each way  Hip 3 way SLR 1 pl x 10 B                                                         Other Therapeutic Activities:  4/14:  Discussed HNP and benefits of improving posture/reducing kyphosis to reduce pain levels.      Home Exercise Program:    06/14/12:  SKTC, LTR, anterior pelvic tilt - gave handout and pt performed 10 reps of each exercise this date    Manual Treatments:      Modalities:      Timed Code Treatment Minutes:    33    Total Treatment Minutes:  33    Treatment/Activity Tolerance:  [x]  Patient tolerated treatment well []  Patient limited by fatique  []  Patient limited by pain  []  Patient limited by other medical complications  []  Other:     Prognosis: [x]  Good []  Fair  []  Poor    Patient  Requires Follow-up: [x]  Yes  []  No    Plan:   [x]  Continue per plan of care []  Alter current plan (see comments)  []  Plan of care initiated []  Hold pending MD visit []  Discharge    Plan for Next Session:   flowsheet as above - core strengthening, postural education; modalities as needed; manual as needed    Electronically signed by:  Dorothe Pea, PT

## 2012-06-21 NOTE — Other (Signed)
Physical Therapy Daily Treatment Note  Date:  06/21/2012    Patient Name:  Andrew [name]    DOB:  July 02, 1940  MRN: [id number]  Restrictions/Precautions:  L1 compression fx  Medical/Treatment Diagnosis Information:   ?? Diagnosis: DDD, lumbar spondylosis, lumbar compression fx, idiopathic scoliosis  ?? Treatment Diagnosis: Low back pain  Insurance/Certification information:   [employer]  Physician Information:   Dr. Alison StallingJanalee Rissover  Plan of care signed (Y/N):  yes  Visit# / total visits:  1/1,  2/6 ([facility] until 5/24)  Pain level: 0/10     Progress Note: []   Yes  [x]   No  Next due by: Visit #10      Subjective:   Having some muscle soreness on the left side since last session. No radicular symptoms.     Objective:   Observation:   Test measurements:  None formally taken this date    Exercises:  Exercise/Equipment Resistance/Repetitions Other comments   Bike  5 min    IB/HR 2x30" / 2x10    Swiss Ball - seated Shifts:  A/P, M/L, CW & CCW circles x 12 each    Alt UE OH x 10  Marches x 10   Manual cueing to B shoulders and thoracolumbar spine for correct technique   Cables Multifidus Walkouts 2 pl x 3 each way   Resume SLR next session    Mat exercises DKTC with SB x 10   TrA with marching x 10 B  TrA with SLR x 10 B  TrA with bridging x 10 (small range)    Prone  Small range hip ext x 10 B Pillow under abdomen                                              Other Therapeutic Activities:  4/14:  Discussed HNP and benefits of improving posture/reducing kyphosis to reduce pain levels.      Home Exercise Program:    06/14/12:  SKTC, LTR, anterior pelvic tilt - gave handout and pt performed 10 reps of each exercise this date    Manual Treatments:      Modalities:      Timed Code Treatment Minutes:    30    Total Treatment Minutes:  30    Treatment/Activity Tolerance:  [x]  Patient tolerated treatment well []  Patient limited by fatique  []  Patient limited by pain  []  Patient limited by other medical complications  []   Other:     Prognosis: [x]  Good []  Fair  []  Poor    Patient Requires Follow-up: [x]  Yes  []  No    Plan:   [x]  Continue per plan of care []  Alter current plan (see comments)  []  Plan of care initiated []  Hold pending MD visit []  Discharge    Plan for Next Session:   flowsheet as above - core strengthening, postural education; modalities as needed; manual as needed    Electronically signed by:  Adella HareKristina Sachi Boulay, PT

## 2012-06-23 NOTE — Other (Signed)
Physical Therapy Daily Treatment Note  Date:  06/23/2012    Patient Name:  Andrew [name]    DOB:  12-02-1940  MRN: [id number]  Restrictions/Precautions:  L1 compression fx  Medical/Treatment Diagnosis Information:   ?? Diagnosis: DDD, lumbar spondylosis, lumbar compression fx, idiopathic scoliosis  ?? Treatment Diagnosis: Low back pain  Insurance/Certification information:   [employer]  Physician Information:   Dr. Alison Stalling Rissover  Plan of care signed (Y/N):  yes  Visit# / total visits:  1/1,  3/6 ([facility] until 5/24)  Pain level: 0/10-pt a bit sore from ex. Pt reports still sleeping in recliner due to back sore with sleeping in bed. Pt states he can stand up to put pants on now.     Progress Note: []   Yes  [x]   No  Next due by: Visit #10      Subjective:   Having some muscle soreness on the left side since last session. No radicular symptoms.     Objective:   Observation:   Test measurements:  None formally taken this date    Exercises:  Exercise/Equipment Resistance/Repetitions Other comments   Bike  5 min 4/18:level 3   IB/HR 2x30" / 2x10    Swiss Ball - seated Shifts:  A/P, M/L, CW & CCW circles x 12 each    Alt UE OH x 10  Marches x 10   Manual cueing to B shoulders and thoracolumbar spine for correct technique   Cables Multifidus Walkouts 2 pl x 3 each way  Hip 3 way SLR 1 pl x 10 B     Mat exercises DKTC with SB x 10   TrA with marching x 10 B  TrA with SLR x 10 B  TrA with bridging x 10 (small range)    Prone  Small range hip ext x 10 B Pillow under abdomen                                              Other Therapeutic Activities:  4/14:  Discussed HNP and benefits of improving posture/reducing kyphosis to reduce pain levels.  4/18:Pt was interested in joining [facility], discussed the 30 days complimentary after PT.      Home Exercise Program:    06/14/12:  SKTC, LTR, anterior pelvic tilt - gave handout and pt performed 10 reps of each exercise this date    Manual Treatments:      Modalities:           Timed Code Treatment Minutes:    30    Total Treatment Minutes:  30    Treatment/Activity Tolerance:  [x]  Patient tolerated treatment well []  Patient limited by fatique  []  Patient limited by pain  []  Patient limited by other medical complications  []  Other:     Prognosis: [x]  Good []  Fair  []  Poor    Patient Requires Follow-up: [x]  Yes  []  No    Plan:   [x]  Continue per plan of care []  Alter current plan (see comments)  []  Plan of care initiated []  Hold pending MD visit []  Discharge    Plan for Next Session:   flowsheet as above - core strengthening, postural education; modalities as needed; manual as needed    Electronically signed by:  Starr Sinclair, [phone]

## 2012-06-26 NOTE — Other (Signed)
Physical Therapy Daily Treatment Note  Date:  06/26/2012    Patient Name:  Andrew [name]    DOB:  [birth year]  MRN: [id number]  Restrictions/Precautions:  L1 compression fx  Medical/Treatment Diagnosis Information:   ?? Diagnosis: DDD, lumbar spondylosis, lumbar compression fx, idiopathic scoliosis  ?? Treatment Diagnosis: Low back pain  Insurance/Certification information:   [employer]  Physician Information:   Dr. Alison Stalling Rissover  Plan of care signed (Y/N):  yes  Visit# / total visits:  1/1,  4/6 ([facility] until 5/24)  Pain level: 1/10-     Progress Note: []   Yes  [x]   No  Next due by: Visit #10      Subjective:   Did some yard work over the weekend so his muscles are a little sore but no major flare ups.  No leg symptoms    Objective:   Observation:   Test measurements:  None formally taken this date    Exercises:  Exercise/Equipment Resistance/Repetitions Other comments   Bike  5 min 4/18:level 3   IB/HR 2x30" / 2x10    Swiss Ball - seated Shifts:  A/P, M/L, CW & CCW circles x 12 each    Alt UE OH x 12 B  Marches x 12 B   Manual cueing to B shoulders and thoracolumbar spine for correct technique   Cables Multifidus Walkouts 2.5 pl x 4 each way  Hip 3 way SLR 1.5 pl x 10 B  Mid rows & high rows seated on ball 3 pl x 12 ea     Mat exercises     Prone  Airex mat Standing 2# ball lifts x 12  2# ball trunk rotation x 12                                          Other Therapeutic Activities:  4/14:  Discussed HNP and benefits of improving posture/reducing kyphosis to reduce pain levels.  4/18:Pt was interested in joining [facility], discussed the 30 days complimentary after PT.      Home Exercise Program:    06/14/12:  SKTC, LTR, anterior pelvic tilt - gave handout and pt performed 10 reps of each exercise this date    Manual Treatments:      Modalities:      Timed Code Treatment Minutes:    34    Total Treatment Minutes:  34    Treatment/Activity Tolerance:  [x]  Patient tolerated treatment well []  Patient limited  by fatique  []  Patient limited by pain  []  Patient limited by other medical complications  []  Other:     Prognosis: [x]  Good []  Fair  []  Poor    Patient Requires Follow-up: [x]  Yes  []  No    Plan:   [x]  Continue per plan of care []  Alter current plan (see comments)  []  Plan of care initiated []  Hold pending MD visit []  Discharge    Plan for Next Session:   flowsheet as above - core strengthening, postural education; modalities as needed; manual as needed    Electronically signed by:  Lopez Dentinger, PT [id number] DPT

## 2012-06-30 NOTE — Other (Signed)
Physical Therapy Daily Treatment Note  Date:  06/30/2012    Patient Name:  Andrew [name]    DOB:  11/30/1940  MRN: [id number]  Restrictions/Precautions:  L1 compression fx  Medical/Treatment Diagnosis Information:   ?? Diagnosis: DDD, lumbar spondylosis, lumbar compression fx, idiopathic scoliosis  ?? Treatment Diagnosis: Low back pain  Insurance/Certification information:   [employer]  Physician Information:   Dr. Alison Stalling Rissover  Plan of care signed (Y/N):  yes  Visit# / total visits:  1/1,  5/6 ([facility] until 5/24)new [facility] paperwork filled out and faxed 06/30/12  Pain level: 3/10     Progress Note: []   Yes  [x]   No  Next due by: Visit #10      Subjective:  Pt reports he would like to do more ex for flexibility and strength in PT dept and push it a little more.    Objective:   Observation:   Test measurements: lumbar flex  70, ext  20 , SB R 30 , SB L 30degrees    Exercises: 06/30/12 rechecked pt for [facility] -did not do swiss ball, cables or airex today.  Exercise/Equipment Resistance/Repetitions Other comments   Bike  5 min 4/18:level 3   IB/HR 2x30" / 2x10    Swiss Ball - seated Shifts:  A/P, M/L, CW & CCW circles x 12 each    Alt UE OH x 12 B  Marches x 12 B   Manual cueing to B shoulders and thoracolumbar spine for correct technique   Cables Multifidus Walkouts 2.5 pl x 4 each way  Hip 3 way SLR 1.5 pl x 10 B  Mid rows & high rows seated on ball 3 pl x 12 ea     Mat exercises     Prone  Airex mat Standing 2# ball lifts x 12  2# ball trunk rotation x 12                                          Other Therapeutic Activities:  4/14:  Discussed HNP and benefits of improving posture/reducing kyphosis to reduce pain levels.  4/18:Pt was interested in joining [facility], discussed the 30 days complimentary after PT.  06/30/12: Pt did lumbar AROM stretches 3x ea.  Prone on 1 pilow under hips prone prop ups 5x      Home Exercise Program:    06/14/12:  SKTC, LTR, anterior pelvic tilt - gave handout and pt  performed 10 reps of each exercise this date    Manual Treatments: Prone prone quad stretches and hip flexors stretches 3x10sec, Supine hams, glut, piriformis 3x10sec.    Modalities:      Timed Code Treatment Minutes:    30    Total Treatment Minutes:  30    Treatment/Activity Tolerance:  [x]  Patient tolerated treatment well []  Patient limited by fatique  []  Patient limited by pain  []  Patient limited by other medical complications  []  Other:     Prognosis: [x]  Good []  Fair  []  Poor    Patient Requires Follow-up: [x]  Yes  []  No    Plan:   [x]  Continue per plan of care []  Alter current plan (see comments)  []  Plan of care initiated []  Hold pending MD visit []  Discharge    Plan for Next Session:   flowsheet as above - core strengthening, postural education; modalities as needed; manual as needed. Await news  from [facility] on further visits,    Electronically signed by:  Starr Sinclair, [phone]

## 2012-07-03 ENCOUNTER — Encounter

## 2012-07-03 MED ORDER — TRAMADOL HCL 50 MG PO TABS
50 MG | ORAL_TABLET | Freq: Two times a day (BID) | ORAL | Status: DC | PRN
Start: 2012-07-03 — End: 2012-09-05

## 2012-07-03 NOTE — Telephone Encounter (Signed)
From: Sherral Hammers  To: Skipper Cliche, MD  Sent: 07/03/2012 11:30 AM EDT  Subject: Medication Renewal Request    Original authorizing provider: Skipper Cliche, MD    Sherral Hammers would like a refill of the following medications:  traMADol (ULTRAM) 50 MG tablet Skipper Cliche, MD]    Preferred pharmacy: [id number] - [place], [place] - [address] - P [phone] - F [phone]    Comment:  My Epidural Injection, received by Dr. Virl Axe at [facility], didn't last as long as I had hoped. I'm contacting him to discuss another injection. In the meantime I will need some more pain relief. Milus Mallick.

## 2012-07-03 NOTE — Other (Signed)
Physical Therapy Daily Treatment Note  Date:  07/03/2012    Patient Name:  Andrew [name]    DOB:  01-Sep-1940  MRN: [id number]  Restrictions/Precautions:  L1 compression fx  Medical/Treatment Diagnosis Information:   ?? Diagnosis: DDD, lumbar spondylosis, lumbar compression fx, idiopathic scoliosis  ?? Treatment Diagnosis: Low back pain  Insurance/Certification information:   [employer]  Physician Information:   Dr. Alison Stalling Rissover  Plan of care signed (Y/N):  yes  Visit# / total visits:  1/1,  6/6 ([facility] until 5/24)new [facility] paperwork filled out and faxed 06/30/12  Pain level: 5-6/10 LBP    Progress Note: []   Yes  [x]   No  Next due by: Visit #10      Subjective:  Pt feeling more pain from bulging disc.  He feels the effects of the epidural may be wearing off.  He plans to f/u with MD to see if he can get another epidural.  Has been feeling more sore all weekend.  No radiating symptoms though.    Objective:   Observation:   Test measurements:  4/25: lumbar flex  70, ext  20 , SB R 30 , SB L 30degrees    Exercises: .  Exercise/Equipment Resistance/Repetitions Other comments   Bike  5 min 4/18:level 3   IB/HR 2x30" / 2x10    Swiss Ball - seated Shifts:  A/P, M/L, CW & CCW circles x 12 each      Marches x 12 B  2# ball lift x 12   Manual cueing to B shoulders and thoracolumbar spine for correct technique   Cables Multifidus Walkouts 2.5 pl x 4 each way  Retro walk 3 pl x 4    Mid rows 3 pl & high rows 4 pl seated on ball  x 15 ea     Mat exercises     Prone  Airex mat                                           Other Therapeutic Activities:  4/14:  Discussed HNP and benefits of improving posture/reducing kyphosis to reduce pain levels.  4/18:Pt was interested in joining [facility], discussed the 30 days complimentary after PT.  06/30/12: Pt did lumbar AROM stretches 3x ea.  Prone on 1 pilow under hips prone prop ups 5x      Home Exercise Program:    06/14/12:  SKTC, LTR, anterior pelvic tilt - gave handout and pt  performed 10 reps of each exercise this date    Manual Treatments:     Modalities:      Timed Code Treatment Minutes:    30    Total Treatment Minutes:  30    Treatment/Activity Tolerance:  [x]  Patient tolerated treatment well []  Patient limited by fatique  []  Patient limited by pain  []  Patient limited by other medical complications  []  Other:     Prognosis: [x]  Good []  Fair  []  Poor    Patient Requires Follow-up: [x]  Yes  []  No    Plan:   [x]  Continue per plan of care []  Alter current plan (see comments)  []  Plan of care initiated []  Hold pending MD visit []  Discharge    Plan for Next Session:   flowsheet as above - core strengthening, postural education; modalities as needed; manual as needed. Await news from [facility] on further visits,  Electronically signed by:  [place], PT [id number] DPT

## 2012-07-06 ENCOUNTER — Encounter: Payer: Self-pay | Admitting: Rheumatology

## 2012-07-07 NOTE — Other (Signed)
Physical Therapy Daily Treatment Note  Date:  07/07/2012    Patient Name:  Andrew [name]    DOB:  12-Dec-1940  MRN: [id number]  Restrictions/Precautions:  L1 compression fx  Medical/Treatment Diagnosis Information:   ?? Diagnosis: DDD, lumbar spondylosis, lumbar compression fx, idiopathic scoliosis  ?? Treatment Diagnosis: Low back pain  Insurance/Certification information:   [employer]  Physician Information:   Dr. Alison Stalling Rissover  Plan of care signed (Y/N):  yes  Visit# / total visits:  1/1,  6/6, 1/3 ( 3 more visits approved by [facility] until 6/9)  Pain level: 2-3/10 LBP    Progress Note: []   Yes  [x]   No  Next due by: Visit #10      Subjective:  Patient reporting the doctor requesting insurance approval for another epidural. No radiating pain. Some times has difficulty going from sit to stand due to back pain.  Objective:   Observation:   Test measurements:  4/25: lumbar flex  70, ext  20 , SB R 30 , SB L 30degrees    Exercises: .  Exercise/Equipment Resistance/Repetitions Other comments   Bike  5 min 4/18:level 3   IB/HR 2x30" / 2x10    Swiss Ball - seated Shifts:  A/P, M/L, CW & CCW circles x 12 each      Marches x 12 B  2# ball lift x 12   Manual cueing to B shoulders and thoracolumbar spine for correct technique   Cables Multifidus Walkouts 2.5 pl x 4 each way  Retro walk 3 pl x 4  Hip 3 way SLR 1.5 pl x 10 B  Mid rows 4 pl & high rows 4 pl seated on ball  x 15 ea     Mat exercises     Prone  Airex mat                                           Other Therapeutic Activities:  4/14:  Discussed HNP and benefits of improving posture/reducing kyphosis to reduce pain levels.  4/18:Pt was interested in joining [facility], discussed the 30 days complimentary after PT.  06/30/12: Pt did lumbar AROM stretches 3x ea.  Prone on 1 pilow under hips prone prop ups 5x      Home Exercise Program:    06/14/12:  SKTC, LTR, anterior pelvic tilt - gave handout and pt performed 10 reps of each exercise this date    Manual  Treatments:     Modalities:      Timed Code Treatment Minutes:    28    Total Treatment Minutes:  28    Treatment/Activity Tolerance:  [x]  Patient tolerated treatment well []  Patient limited by fatique  []  Patient limited by pain  []  Patient limited by other medical complications  []  Other:     Prognosis: [x]  Good []  Fair  []  Poor    Patient Requires Follow-up: [x]  Yes  []  No    Plan:   [x]  Continue per plan of care []  Alter current plan (see comments)  []  Plan of care initiated []  Hold pending MD visit []  Discharge    Plan for Next Session:   flowsheet as above - core strengthening, postural education; modalities as needed; manual as needed.    Electronically signed by:  Carney Corners, PT [phone]

## 2012-07-10 NOTE — Other (Signed)
Physical Therapy Daily Treatment Note  Date:  07/10/2012    Patient Name:  Andrew [name]    DOB:  [birth year]  MRN: [id number]  Restrictions/Precautions:  L1 compression fx  Medical/Treatment Diagnosis Information:   ?? Diagnosis: DDD, lumbar spondylosis, lumbar compression fx, idiopathic scoliosis  ?? Treatment Diagnosis: Low back pain  Insurance/Certification information:   [employer]  Physician Information:   Dr. Alison Stalling Rissover  Plan of care signed (Y/N):  yes  Visit# / total visits:  1/1,  6/6, 2/3 ( 3 more visits approved by [facility] until 6/9)  Pain level: 0/10 LBP    Progress Note: []   Yes  [x]   No  Next due by: Visit #10      Subjective:  Patient reporting no pain today. Was not as sore this morning when he first got up as he usually is.  Objective:   Observation:   Test measurements:  4/25: lumbar flex  70, ext  20 , SB R 30 , SB L 30degrees    Exercises: .  Exercise/Equipment Resistance/Repetitions Other comments   Bike  5 min 4/18:level 3   IB/HR 2x30" / 2x10    Swiss Ball - seated Shifts:  A/P, M/L, CW & CCW circles x 12 each      Marches x 12 B  2# ball lift x 12   Manual cueing to B shoulders and thoracolumbar spine for correct technique   Cables Multifidus Walkouts 2.5 pl x 4 each way  Retro walk 3 pl x 4  Hip 3 way SLR 1.5 pl x 10 B  Mid rows 4 pl & high rows 4 pl seated on ball  x 15 ea     Mat exercises DKTC with SB x 10   LTR with ball x8 (stopped due to pain)    Prone  Airex mat                                           Other Therapeutic Activities:  4/14:  Discussed HNP and benefits of improving posture/reducing kyphosis to reduce pain levels.  4/18:Pt was interested in joining [facility], discussed the 30 days complimentary after PT.  06/30/12: Pt did lumbar AROM stretches 3x ea.  Prone on 1 pilow under hips prone prop ups 5x      Home Exercise Program:    06/14/12:  SKTC, LTR, anterior pelvic tilt - gave handout and pt performed 10 reps of each exercise this date    Manual Treatments:          Modalities:      Timed Code Treatment Minutes:    27    Total Treatment Minutes:  27    Treatment/Activity Tolerance:  [x]  Patient tolerated treatment well []  Patient limited by fatique  []  Patient limited by pain  []  Patient limited by other medical complications  []  Other:     Prognosis: [x]  Good []  Fair  []  Poor    Patient Requires Follow-up: [x]  Yes  []  No    Plan:   [x]  Continue per plan of care []  Alter current plan (see comments)  []  Plan of care initiated []  Hold pending MD visit []  Discharge    Plan for Next Session:   flowsheet as above - core strengthening, postural education; modalities as needed; manual as needed.    Electronically signed by:  Carney Corners, PT [phone]

## 2012-07-12 NOTE — Other (Signed)
Physical Therapy Daily Treatment Note  Date:  07/12/2012    Patient Name:  Andrew [name]    DOB:  [birth year]  MRN: [id number]  Restrictions/Precautions:  L1 compression fx  Medical/Treatment Diagnosis Information:   ?? Diagnosis: DDD, lumbar spondylosis, lumbar compression fx, idiopathic scoliosis  ?? Treatment Diagnosis: Low back pain  Insurance/Certification information:   [employer]  Physician Information:   Dr. Alison Stalling Rissover  Plan of care signed (Y/N):  yes  Visit# / total visits:  1/1,  6/6, 3/3 ( 3 more visits approved by [facility] until 6/9)    10/18  MD approved  Pain level: 1-2/10 LBP    Progress Note: []   Yes  [x]   No  Next due by: Visit #10      Subjective:  States he is doing so-so. States he had an epidural injection that helped for 3 weeks and his neurosurgeon is getting another one okayed from the insurance. Not sure if he wants to do another injection, if his pain stays low.    Objective:   Observation:   Test measurements:  4/25: lumbar flex  70, ext  20 , SB R 30 , SB L 30degrees    Exercises: .  Exercise/Equipment Resistance/Repetitions Other comments   Bike  5 min 4/18:level 3   IB/HR 2x30" / 2x10    Swiss Ball - seated Shifts:  A/P, M/L, CW & CCW circles x 12 each      Marches x 12 B  2# ball lift x 12   Manual cueing to B shoulders and thoracolumbar spine for correct technique   Cables Multifidus Walkouts 2.5 pl x 4 each way    Hip 3 way SLR 1.5 pl x 10 B  Mid rows 4 pl & high rows 4 pl l  x 15 ea     Mat exercises     Prone  Airex mat                                           Other Therapeutic Activities:  4/14:  Discussed HNP and benefits of improving posture/reducing kyphosis to reduce pain levels.  4/18:Pt was interested in joining [facility], discussed the 30 days complimentary after PT.  06/30/12: Pt did lumbar AROM stretches 3x ea.  Prone on 1 pilow under hips prone prop ups 5x      Home Exercise Program:    06/14/12:  SKTC, LTR, anterior pelvic tilt - gave handout and pt performed  10 reps of each exercise this date    Manual Treatments:     Modalities:      Timed Code Treatment Minutes:    31    Total Treatment Minutes:  31    Treatment/Activity Tolerance:  [x]  Patient tolerated treatment well []  Patient limited by fatique  []  Patient limited by pain  []  Patient limited by other medical complications  []  Other:     Prognosis: [x]  Good []  Fair  []  Poor    Patient Requires Follow-up: [x]  Yes  []  No    Plan:   []  Continue per plan of care []  Alter current plan (see comments)  []  Plan of care initiated [x]  Hold pending MD visit []  Discharge    Plan for Next Session:     Gave pt [employer] pass and tshirt. Pt to follow up with MD then may return for PT. Needs request sent  to [facility] if he comes back.    Electronically signed by:  Rande Brunt, PT

## 2012-07-12 NOTE — Progress Notes (Signed)
Outpatient Physical Therapy   Phone: [phone] Fax: [phone]    Physical Therapy Progress Note  Date: 07/12/2012        Patient Name:  Andrew [name]    DOB:  1940-03-28  MRN: [id number]  Restrictions/Precautions: L1 compression fx   Medical/Treatment Diagnosis Information:   Diagnosis: DDD, lumbar spondylosis, lumbar compression fx, idiopathic scoliosis   Treatment Diagnosis: Low back pain  Insurance/Certification information: [employer]   Physician Information: Dr. Alison Stalling Rissover   Plan of care signed (Y/N): yes   Visit# / total visits: 1/1, 6/6, 3/3 ( 3 more visits approved by [employer] until 6/9) 10/18 MD approved   Pain level: 1-2/10 LBP    G-Code (if applicable):      Date G-Code Applied:  07/12/2012        Time Period for Report: 06/14/12 to 07/12/12   Cancels/No-shows to date:  0    Plan of Care/Treatment to date:  [x]  Therapeutic Exercise    []  Modalities:  [x]  Therapeutic Activity     []  Ultrasound  []  Electrical Stimulation  []  Gait Training      []  Cervical Traction    []  Lumbar Traction  []  Neuromuscular Re-education  []  Cold/hotpack []  Iontophoresis  [x]  Instruction in HEP      Other:  [x]  Manual Therapy       []     []  Aquatic Therapy       []                     ?      Significant Findings At Last Visit/Comments:    Subjective:  Subjective  Subjective: States he is doing so-so. States he had an epidural injection that helped for 3 weeks and his neurosurgeon is getting another one okayed from the insurance. Not sure if he wants to do another injection, if his pain stays low.        Objective:  Spine  Lumbar: Flexion 70, Extension 20, Sidebend R and L 30    Assessment:  Assessment  Assessment: Pt has improved lumbar AROM and has lowered pain levels since starting PT which has allowed him to increase his functional mobility to do more ADLs. Pt still has low amount of pain when standing up without UE support but is improving. Plan is to hold PT untill next MD visit and possible  injection.  Prognosis: Good;Fair    Plan:   Plan  Plan: Hold pending MD assessment       Progress towards goals:    Long term goals  Long term goal 1: Pt will improve lumbar segmental mobility to improve upright posture to be able to walk through grocery store without needing grocery cart- MET- mild pain when walking without cart  Long term goal 2: Pt will increase core strength to improve stand tolerance to >15 minutes to be able to tolerate walking at least 1 mile for exercise - PART MET- states he can walk 1 mile with relative discomfort  Long term goal 3: Pt will report pain levels 2/10 or less to be able to resume PLOF- MET pain, part met PLOF    Current Frequency/Duration:  # Days per week: []  1 day # Weeks: []  1 week []  4 weeks      [x]  2 days?   []  2 weeks [x]  5 weeks      []  3 days   []  3 weeks []  6 weeks     Rehab Potential: []  Excellent [x]   Good []  Fair  []  Poor     Goal Status:  []  Achieved [x]  Partially Achieved  []  Not Achieved     Patient Status: [x]  Continue per initial plan of Care- hold pending MD visit     []  Patient now discharged     []  Additional visits requested, Please re-certify for additional visits:      Requested frequency/duration:  X/week for weeks    Electronically signed by:  Rande Brunt, PT    If you have any questions or concerns, please don't hesitate to call.  Thank you for your referral.    Physician Signature:________________________________Date:__________________  By signing above, therapist???s plan is approved by physician

## 2012-07-18 MED ORDER — NADOLOL 20 MG PO TABS
20 MG | ORAL_TABLET | ORAL | Status: DC
Start: 2012-07-18 — End: 2013-01-14

## 2012-08-08 MED ORDER — TAMSULOSIN HCL 0.4 MG PO CAPS
0.4 MG | ORAL_CAPSULE | Freq: Every day | ORAL | Status: DC
Start: 2012-08-08 — End: 2013-02-07

## 2012-08-08 NOTE — Telephone Encounter (Signed)
From: Sherral Hammers  To: Skipper Cliche, MD  Sent: 08/08/2012 10:01 AM EDT  Subject: Alcohol & Tobacco Use    History questionnaire submitted on Tuesday, August 08, 2012 at 10:01:29 AM  Questionnaire: Alcohol & Tobacco Use  Patient: Andrew [name] [[id number]]      Social History:    Question: Alcohol Use  Response: No    Question: Tobacco Use  Response: Never  Comments:

## 2012-08-08 NOTE — Progress Notes (Signed)
Chief Complaint   Patient presents with   ??? Blood Pressure Check      No Follow-up on file.    HPI  Hypertension  Assessment/Plan   Compliant with medications. No side effects reported. No cardiovascular symptoms reported.   reports frequent urination.   lasix The current treatment plan is effective and there are no changes made.    Back Pain    Two esi. Improved. Now just rib pain.   No longer using pain medication.  improved   bph    Frequent urination. No nocturia Trial of flomax       Other History/Review of Systems     Pertinent symptoms reported in HPI.     Physical Exam     Wt Readings from Last 3 Encounters:   08/08/12 198 lb (89.812 kg)   04/19/12 200 lb (90.719 kg)   01/17/12 200 lb (90.719 kg)    BP Readings from Last 3 Encounters:   08/08/12 118/70   04/19/12 120/84   01/17/12 110/62       BP 118/70   Pulse 70   Ht 5' 7.5" (1.715 m)   Wt 198 lb (89.812 kg)   BMI 30.54 kg/m2  General: Alert, well appearing, and in no distress.   Cardiovascular: trace edema           Lab Data     Lab Results   Component Value Date    LDLCALC 84 12/29/2009        Electrolytes Latest Ref Rng 05/14/2011 11/19/2011 05/02/2012   Na 136 - 145 mmol/L 140 143 142   K 3.5 - 5.1 mmol/L 4.0 4.8 4.7   Cl 99 - 110 mmol/L 108 110 106   CO2 21 - 32 mmol/L 28 29 27    BUN 7 - 20 mg/dL 16(X) 17 17   Cr 0.8 - 1.3 mg/dL 0.8 0.9 0.8   Glu 70 - 99 mg/dL 92 96 09(U)   Ca 8.3 - 10.6 mg/dL 8.6 8.8 8.8   Phos 2.5 - 4.9 mg/dl - - -   Mg 1.8 - 2.4 mg/dL 2.1 2.0 -

## 2012-08-29 MED ORDER — FUROSEMIDE 40 MG PO TABS
40 MG | ORAL_TABLET | ORAL | Status: DC
Start: 2012-08-29 — End: 2013-02-15

## 2012-09-05 MED ORDER — AMILORIDE HCL 5 MG PO TABS
5 MG | ORAL_TABLET | ORAL | Status: DC
Start: 2012-09-05 — End: 2012-11-11

## 2012-09-05 MED ORDER — TRAMADOL HCL 50 MG PO TABS
50 MG | ORAL_TABLET | Freq: Two times a day (BID) | ORAL | Status: DC | PRN
Start: 2012-09-05 — End: 2013-04-07

## 2012-09-05 NOTE — Telephone Encounter (Signed)
Patient is requesting the following written prescription(s):  1.  aMILoride (MIDAMOR) 5 MG tablet #28  Patient states that he has requested a refill from his mail order pharmacy, but they stated that they were Not sure if they could get it to him before he goes out of town on [place]. He is therefore only requesting a written prescription for a quantity of 28, which is a 14 day supply, in the event he doesn't receive this before he leaves.  Also, Patient is requesting the following prescription for pick up as well, but is requesting the "regular amount (quantity) please be prescribed instead of a 14 day supply:  2.  traMADol (ULTRAM) 50 MG tablet # 30 w/ refills.  Please contact patient @ phone # provided.  Patient made aware to allow 24 to 48 business hours for prescription refills.  Please contact patient @ phone # provided.

## 2012-09-05 NOTE — Telephone Encounter (Signed)
Okay for tramadol?

## 2012-09-14 NOTE — Progress Notes (Signed)
Outpatient Physical Therapy   Phone: [phone] Fax: [phone]    Physical Therapy Discharge Note  Date: 09/14/2012        Patient Name:  Andrew [name]    DOB:  February 26, 1941  MRN: [id number]  Restrictions/Precautions: L1 compression fx   Medical/Treatment Diagnosis Information:   Diagnosis: DDD, lumbar spondylosis, lumbar compression fx, idiopathic scoliosis   Treatment Diagnosis: Low back pain  Insurance/Certification information: [employer]   Physician Information: Dr. Alison Stalling Rissover   Plan of care signed (Y/N): yes   Visit# / total visits: 1/1, 6/6, 3/3 ( 3 more visits approved by [employer] until 6/9) 10/18 MD approved   Pain level: 1-2/10 LBP    G-Code (if applicable):      Date G-Code Applied:  09/14/2012        Time Period for Report: 06/14/12 to 07/12/12   Cancels/No-shows to date:  0    Plan of Care/Treatment to date:  [x]  Therapeutic Exercise    []  Modalities:  [x]  Therapeutic Activity     []  Ultrasound  []  Electrical Stimulation  []  Gait Training      []  Cervical Traction    []  Lumbar Traction  []  Neuromuscular Re-education  []  Cold/hotpack []  Iontophoresis  [x]  Instruction in HEP      Other:  [x]  Manual Therapy       []     []  Aquatic Therapy       []                     ?      Significant Findings At Last Visit/Comments:    Subjective:  Subjective  Subjective: States he is doing so-so. States he had an epidural injection that helped for 3 weeks and his neurosurgeon is getting another one okayed from the insurance. Not sure if he wants to do another injection, if his pain stays low.     Objective:  Spine  Lumbar: Flexion 70, Extension 20, Sidebend R and L 30    Assessment:  Assessment  Assessment: Pt has improved lumbar AROM and has lowered pain levels since starting PT which has allowed him to increase his functional mobility to do more ADLs. Pt still has low amount of pain when standing up without UE support but is improving. Plan is to hold PT untill next MD visit and possible  injection.  Prognosis: Good;Fair    Progress towards goals:    Long term goals  Long term goal 1: Pt will improve lumbar segmental mobility to improve upright posture to be able to walk through grocery store without needing grocery cart- MET- mild pain when walking without cart  Long term goal 2: Pt will increase core strength to improve stand tolerance to >15 minutes to be able to tolerate walking at least 1 mile for exercise - PART MET- states he can walk 1 mile with relative discomfort  Long term goal 3: Pt will report pain levels 2/10 or less to be able to resume PLOF- MET pain, part met PLOF    Current Frequency/Duration:  # Days per week: []  1 day # Weeks: []  1 week []  4 weeks      [x]  2 days?   []  2 weeks [x]  5 weeks      []  3 days   []  3 weeks []  6 weeks     Rehab Potential: []  Excellent [x]  Good []  Fair  []  Poor     Goal Status:  []  Achieved [x]  Partially Achieved  []   Not Achieved     Patient Status: []  Continue per initial plan of Care-     [x]  Patient now discharged     []  Additional visits requested, Please re-certify for additional visits:      Requested frequency/duration:  X/week for weeks    Electronically signed by:  Rande Brunt, PT    If you have any questions or concerns, please don't hesitate to call.  Thank you for your referral.    Physician Signature:________________________________Date:__________________  By signing above, therapist???s plan is approved by physician

## 2012-09-28 ENCOUNTER — Ambulatory Visit: Payer: Self-pay | Admitting: Gastroenterology

## 2012-10-01 LAB — PATHOLOGY REPORT

## 2012-11-12 MED ORDER — AMILORIDE HCL 5 MG PO TABS
5 MG | ORAL_TABLET | ORAL | Status: DC
Start: 2012-11-12 — End: 2013-02-23

## 2012-11-27 ENCOUNTER — Other Ambulatory Visit: Payer: Self-pay | Admitting: Orthopedic Surgery

## 2012-12-01 ENCOUNTER — Encounter (HOSPITAL_BASED_OUTPATIENT_CLINIC_OR_DEPARTMENT_OTHER): Payer: Self-pay | Admitting: *Deleted

## 2012-12-01 NOTE — Progress Notes (Signed)
States he had recent labs pcp-will call-to come in for bmet-bring all meds

## 2012-12-04 NOTE — H&P (Signed)
Dustin Crane is an 72 y.o. male.   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.  He presents for evaluation of progressive pain, swelling, loss of range of motion and functional impairment involving his right hand with specific attention to the right index metacarpophalangeal joint.    Many years ago he sustained a shrapnel injury to his right hand, he was advised by his [employer] physicians that he would likely experience arthritis in the long run.  He now has difficulty with activities as simple as shaking hands or pinch prehension with the right index finger due to pain at the MP joint.  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   He has been taking Advil 6-8 tablets daily, but states that this caused significant GI upset.  He has seen you for evaluation and has had a total of three steroid injections in the past year into his index MP joint with transient relief after each injection.  He now seeks a hand surgery consult.    Past Medical History  Diagnosis Date  . Diabetes mellitus without complication   . Hypertension   . Hyperlipemia   . Wears glasses   . Coronary artery disease   . Wears hearing aid     both ears  . GERD (gastroesophageal reflux disease)   . Arthritis     Past Surgical History  Procedure Laterality Date  . Tonsillectomy    . Hernia repair  1990    rt ing   . Colonoscopy    . Shoulder acromioplasty  1967    [place]   . Shoulder arthroscopy  2012    right  . Cardiac catheterization  2005    stents x2    History reviewed. No pertinent family history. Social History:  reports that he quit smoking about 40 years ago. He does not have any smokeless tobacco history on file. He reports that  drinks alcohol. He reports that he does not use illicit drugs.  Allergies:   Allergies  Allergen Reactions  . Influenza Vac Split [Flu Virus Vaccine] Shortness Of Breath    Rash-severe n/v/d  . Compazine [Prochlorperazine Edisylate]     Muscle tightness    No prescriptions prior to admission    No results found for this or any previous visit (from the past 48 hour(s)).  No results found.   Pertinent items are noted in HPI.  Height 5\' 8"  (1.727 m), weight 81.194 kg (179 lb).  General appearance: alert Head: Normocephalic, without obvious abnormality Neck: supple, symmetrical, trachea midline Resp: clear to auscultation bilaterally Cardio: regular rate and rhythm GI: normal findings: bowel sounds normal Extremities: Inspection of his hand reveals fullness of his right index and long finger MP joint capsules bilaterally.  He has minimal swelling of his left index finger MP joint capsule.  He has Heberden's and Bouchard's nodes.  His range of motion to the index, MP joint on the right is hyperextension 0, further flexion 60 limited by pain in extension and flexion.  This compares with hyperextension 30, further flexion 85 on the left without pain.  His long finger motion is hyperextension 25 degrees, further flexion 85 degrees limited by pain on the right vs. hyperextension 40, further flexion 90 on the left. He can close his fingers tight to the palm.  He has pain with grind and translation of his index MP joint.  He cannot shake hands comfortably.  His neurovascular examination is intact.  He has intact motor and sensory function.    Plain films of his hand, four views of the index finger MP joint and a Brewington view demonstrate bone-on-bone arthropathy in the index metacarpophalangeal joint and early arthropathy of the long finger metacarpophalangeal joint.    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  DASNOIT,December Hedtke J 12/04/2012, 1:42 PM   H&P documentation: 12/05/2012  -History and Physical Reviewed  -Patient has been re-examined  -No change in the plan of care  Wyn Forster, MD

## 2012-12-04 NOTE — Progress Notes (Signed)
Pt called-went to [facility] clinic this am for bmet-they are to fax results here

## 2012-12-05 ENCOUNTER — Encounter (HOSPITAL_BASED_OUTPATIENT_CLINIC_OR_DEPARTMENT_OTHER)
Admission: RE | Disposition: A | Discharge status: Home / Self Care | Payer: Self-pay | Source: Ambulatory Visit | Attending: Orthopedic Surgery

## 2012-12-05 ENCOUNTER — Encounter (HOSPITAL_BASED_OUTPATIENT_CLINIC_OR_DEPARTMENT_OTHER): Payer: Self-pay

## 2012-12-05 ENCOUNTER — Encounter (HOSPITAL_BASED_OUTPATIENT_CLINIC_OR_DEPARTMENT_OTHER): Payer: Self-pay | Admitting: Anesthesiology

## 2012-12-05 ENCOUNTER — Ambulatory Visit (HOSPITAL_BASED_OUTPATIENT_CLINIC_OR_DEPARTMENT_OTHER)
Admission: RE | Admit: 2012-12-05 | Discharge: 2012-12-05 | Disposition: A | Discharge status: Home / Self Care | Payer: Medicare Other | Source: Ambulatory Visit | Attending: Orthopedic Surgery | Admitting: Orthopedic Surgery

## 2012-12-05 ENCOUNTER — Ambulatory Visit (HOSPITAL_BASED_OUTPATIENT_CLINIC_OR_DEPARTMENT_OTHER): Payer: Medicare Other | Admitting: Anesthesiology

## 2012-12-05 DIAGNOSIS — E785 Hyperlipidemia, unspecified: Secondary | ICD-10-CM | POA: Insufficient documentation

## 2012-12-05 DIAGNOSIS — M19049 Primary osteoarthritis, unspecified hand: Secondary | ICD-10-CM | POA: Insufficient documentation

## 2012-12-05 DIAGNOSIS — E119 Type 2 diabetes mellitus without complications: Secondary | ICD-10-CM | POA: Insufficient documentation

## 2012-12-05 DIAGNOSIS — I2510 Atherosclerotic heart disease of native coronary artery without angina pectoris: Secondary | ICD-10-CM | POA: Insufficient documentation

## 2012-12-05 DIAGNOSIS — K219 Gastro-esophageal reflux disease without esophagitis: Secondary | ICD-10-CM | POA: Insufficient documentation

## 2012-12-05 DIAGNOSIS — I10 Essential (primary) hypertension: Secondary | ICD-10-CM | POA: Insufficient documentation

## 2012-12-05 HISTORY — DX: Essential (primary) hypertension: I10

## 2012-12-05 HISTORY — DX: Atherosclerotic heart disease of native coronary artery without angina pectoris: I25.10

## 2012-12-05 HISTORY — DX: Presence of spectacles and contact lenses: Z97.3

## 2012-12-05 HISTORY — DX: Hyperlipidemia, unspecified: E78.5

## 2012-12-05 HISTORY — DX: Type 2 diabetes mellitus without complications: E11.9

## 2012-12-05 HISTORY — DX: Unspecified osteoarthritis, unspecified site: M19.90

## 2012-12-05 HISTORY — DX: Gastro-esophageal reflux disease without esophagitis: K21.9

## 2012-12-05 HISTORY — DX: Presence of external hearing-aid: Z97.4

## 2012-12-05 HISTORY — PX: FINGER ARTHROPLASTY: SHX5017

## 2012-12-05 LAB — GLUCOSE, CAPILLARY: Glucose-Capillary: 103 mg/dL — ABNORMAL HIGH (ref 70–99)

## 2012-12-05 SURGERY — ARTHROPLASTY, FINGER
Anesthesia: General | Site: Hand | Laterality: Right | Wound class: Clean

## 2012-12-05 MED ORDER — OXYCODONE HCL 5 MG PO TABS: 5.0000 mg | ORAL_TABLET | Freq: Once | ORAL | Status: DC | PRN

## 2012-12-05 MED ORDER — LACTATED RINGERS IV SOLN
INTRAVENOUS | Status: DC
  Administered 2012-12-05 (×2): via INTRAVENOUS

## 2012-12-05 MED ORDER — HYDROMORPHONE HCL 2 MG PO TABS: 2.0000 mg | ORAL_TABLET | ORAL | Status: DC | PRN

## 2012-12-05 MED ORDER — MIDAZOLAM HCL 5 MG/5ML IJ SOLN
INTRAMUSCULAR | Status: DC | PRN
  Administered 2012-12-05: 1 mg via INTRAVENOUS

## 2012-12-05 MED ORDER — HYDROMORPHONE HCL PF 1 MG/ML IJ SOLN: 0.2500 mg | INTRAMUSCULAR | Status: DC | PRN

## 2012-12-05 MED ORDER — LIDOCAINE HCL (CARDIAC) 20 MG/ML IV SOLN
INTRAVENOUS | Status: DC | PRN
  Administered 2012-12-05: 50 mg via INTRAVENOUS

## 2012-12-05 MED ORDER — ONDANSETRON HCL 4 MG/2ML IJ SOLN
INTRAMUSCULAR | Status: DC | PRN
  Administered 2012-12-05: 4 mg via INTRAVENOUS

## 2012-12-05 MED ORDER — CEPHALEXIN 500 MG PO CAPS: 500.0000 mg | ORAL_CAPSULE | Freq: Three times a day (TID) | ORAL | Status: DC

## 2012-12-05 MED ORDER — MIDAZOLAM HCL 2 MG/2ML IJ SOLN
1.0000 mg | INTRAMUSCULAR | Status: DC | PRN
  Administered 2012-12-05: 2 mg via INTRAVENOUS

## 2012-12-05 MED ORDER — FENTANYL CITRATE 0.05 MG/ML IJ SOLN
INTRAMUSCULAR | Status: DC | PRN
  Administered 2012-12-05: 50 ug via INTRAVENOUS

## 2012-12-05 MED ORDER — CEFAZOLIN SODIUM-DEXTROSE 2-3 GM-% IV SOLR
INTRAVENOUS | Status: DC | PRN
  Administered 2012-12-05: 2 g via INTRAVENOUS

## 2012-12-05 MED ORDER — DEXAMETHASONE SODIUM PHOSPHATE 10 MG/ML IJ SOLN
INTRAMUSCULAR | Status: DC | PRN
  Administered 2012-12-05: 10 mg via INTRAVENOUS

## 2012-12-05 MED ORDER — FENTANYL CITRATE 0.05 MG/ML IJ SOLN
50.0000 ug | INTRAMUSCULAR | Status: DC | PRN
  Administered 2012-12-05: 100 ug via INTRAVENOUS

## 2012-12-05 MED ORDER — ROPIVACAINE HCL 5 MG/ML IJ SOLN
INTRAMUSCULAR | Status: DC | PRN
  Administered 2012-12-05: 30 mL via EPIDURAL

## 2012-12-05 MED ORDER — ONDANSETRON HCL 4 MG/2ML IJ SOLN: 4.0000 mg | Freq: Four times a day (QID) | INTRAMUSCULAR | Status: DC | PRN

## 2012-12-05 MED ORDER — PROPOFOL INFUSION 10 MG/ML OPTIME
INTRAVENOUS | Status: DC | PRN
  Administered 2012-12-05: 100 ug/kg/min via INTRAVENOUS

## 2012-12-05 MED ORDER — OXYCODONE HCL 5 MG/5ML PO SOLN: 5.0000 mg | Freq: Once | ORAL | Status: DC | PRN

## 2012-12-05 MED ORDER — CHLORHEXIDINE GLUCONATE 4 % EX LIQD: 60.0000 mL | Freq: Once | CUTANEOUS | Status: DC

## 2012-12-05 SURGICAL SUPPLY — 83 items
BAG DECANTER FOR FLEXI CONT (MISCELLANEOUS) IMPLANT
BANDAGE ADHESIVE 1X3 (GAUZE/BANDAGES/DRESSINGS) IMPLANT
BANDAGE COBAN STERILE 2 (GAUZE/BANDAGES/DRESSINGS) ×2 IMPLANT
BANDAGE ELASTIC 3 VELCRO ST LF (GAUZE/BANDAGES/DRESSINGS) IMPLANT
BANDAGE GAUZE ELAST BULKY 4 IN (GAUZE/BANDAGES/DRESSINGS) ×2 IMPLANT
BLADE AVERAGE 25X9 (BLADE) ×2 IMPLANT
BLADE MINI RND TIP GREEN BEAV (BLADE) IMPLANT
BLADE OSC/SAG .038X5.5 CUT EDG (BLADE) IMPLANT
BLADE SURG 15 STRL LF DISP TIS (BLADE) ×1 IMPLANT
BLADE SURG 15 STRL SS (BLADE) ×1
BNDG COHESIVE 1X5 TAN STRL LF (GAUZE/BANDAGES/DRESSINGS) IMPLANT
BNDG COHESIVE 3X5 TAN STRL LF (GAUZE/BANDAGES/DRESSINGS) ×2 IMPLANT
BNDG ELASTIC 2 VLCR STRL LF (GAUZE/BANDAGES/DRESSINGS) IMPLANT
BNDG ESMARK 4X9 LF (GAUZE/BANDAGES/DRESSINGS) ×2 IMPLANT
BRUSH SCRUB EZ PLAIN DRY (MISCELLANEOUS) ×2 IMPLANT
BUR EGG 3PK/BX (BURR) IMPLANT
BUR EGG/OVAL CARBIDE (BURR) IMPLANT
BUR FAST CUTTING MED (BURR) IMPLANT
BUR STRYKER (BURR) IMPLANT
BUR SWANSON PILOT POINT (BURR) IMPLANT
BUR SWANSON PILOT POINT MED (BURR) ×2 IMPLANT
CANISTER SUCTION 1200CC (MISCELLANEOUS) ×2 IMPLANT
CLOTH BEACON ORANGE TIMEOUT ST (SAFETY) ×2 IMPLANT
CORDS BIPOLAR (ELECTRODE) ×2 IMPLANT
COVER MAYO STAND STRL (DRAPES) ×2 IMPLANT
COVER TABLE BACK 60X90 (DRAPES) ×2 IMPLANT
CUFF TOURNIQUET SINGLE 18IN (TOURNIQUET CUFF) ×2 IMPLANT
DECANTER SPIKE VIAL GLASS SM (MISCELLANEOUS) IMPLANT
DRAPE EXTREMITY T 121X128X90 (DRAPE) ×2 IMPLANT
DRAPE OEC MINIVIEW 54X84 (DRAPES) ×2 IMPLANT
DRAPE SURG 17X23 STRL (DRAPES) ×2 IMPLANT
GAUZE SPONGE 4X4 16PLY XRAY LF (GAUZE/BANDAGES/DRESSINGS) IMPLANT
GAUZE XEROFORM 1X8 LF (GAUZE/BANDAGES/DRESSINGS) IMPLANT
GLOVE BIOGEL M STRL SZ7.5 (GLOVE) IMPLANT
GLOVE BIOGEL PI IND STRL 7.0 (GLOVE) ×1 IMPLANT
GLOVE BIOGEL PI INDICATOR 7.0 (GLOVE) ×1
GLOVE ECLIPSE 6.5 STRL STRAW (GLOVE) ×2 IMPLANT
GLOVE EXAM NITRILE LRG STRL (GLOVE) ×2 IMPLANT
GLOVE ORTHO TXT STRL SZ7.5 (GLOVE) ×2 IMPLANT
GOWN BRE IMP PREV XXLGXLNG (GOWN DISPOSABLE) ×2 IMPLANT
GOWN PREVENTION PLUS XLARGE (GOWN DISPOSABLE) ×2 IMPLANT
IMPLANT FINGER JOINT SZ 5 (Finger Joint) ×2 IMPLANT
NEEDLE 27GAX1X1/2 (NEEDLE) IMPLANT
NEEDLE BLUNT 17GA (NEEDLE) IMPLANT
NEEDLE HYPO 22GX1.5 SAFETY (NEEDLE) IMPLANT
NS IRRIG 1000ML POUR BTL (IV SOLUTION) ×2 IMPLANT
PACK BASIN DAY SURGERY FS (CUSTOM PROCEDURE TRAY) ×2 IMPLANT
PAD CAST 3X4 CTTN HI CHSV (CAST SUPPLIES) ×1 IMPLANT
PADDING CAST ABS 4INX4YD NS (CAST SUPPLIES)
PADDING CAST ABS COTTON 4X4 ST (CAST SUPPLIES) IMPLANT
PADDING CAST COTTON 3X4 STRL (CAST SUPPLIES) ×1
PADDING UNDERCAST 2  STERILE (CAST SUPPLIES) ×2 IMPLANT
SLEEVE SCD COMPRESS KNEE MED (MISCELLANEOUS) ×2 IMPLANT
SLING ARM FOAM STRAP LRG (SOFTGOODS) ×2 IMPLANT
SPLINT FNGR BALL END 5/8X4.25 (SOFTGOODS) IMPLANT
SPLINT PLASTALUME BALL 4 1/4IN (SOFTGOODS)
SPLINT PLASTER CAST XFAST 3X15 (CAST SUPPLIES) ×15 IMPLANT
SPLINT PLASTER XTRA FASTSET 3X (CAST SUPPLIES) ×15
SPONGE GAUZE 4X4 12PLY (GAUZE/BANDAGES/DRESSINGS) ×2 IMPLANT
STOCKINETTE 4X48 STRL (DRAPES) ×2 IMPLANT
STRIP CLOSURE SKIN 1/2X4 (GAUZE/BANDAGES/DRESSINGS) ×2 IMPLANT
SUT ETHIBOND 3-0 V-5 (SUTURE) ×2 IMPLANT
SUT ETHILON 5 0 P 3 18 (SUTURE)
SUT FIBERWIRE 3-0 18 TAPR NDL (SUTURE) ×2
SUT FIBERWIRE 4-0 18 TAPR NDL (SUTURE)
SUT MERSILENE 4 0 P 3 (SUTURE) IMPLANT
SUT NYLON ETHILON 5-0 P-3 1X18 (SUTURE) IMPLANT
SUT PROLENE 3 0 PS 2 (SUTURE) IMPLANT
SUT PROLENE 4 0 P 3 18 (SUTURE) ×2 IMPLANT
SUT STEEL 0 (SUTURE)
SUT STEEL 0 18XMFL TIE 17 (SUTURE) IMPLANT
SUT VIC AB 4-0 P-3 18XBRD (SUTURE) ×1 IMPLANT
SUT VIC AB 4-0 P3 18 (SUTURE) ×1
SUTURE FIBERWR 3-0 18 TAPR NDL (SUTURE) ×1 IMPLANT
SUTURE FIBERWR 4-0 18 TAPR NDL (SUTURE) IMPLANT
SYR 20CC LL (SYRINGE) IMPLANT
SYR 3ML 23GX1 SAFETY (SYRINGE) IMPLANT
SYR BULB 3OZ (MISCELLANEOUS) ×2 IMPLANT
SYR CONTROL 10ML LL (SYRINGE) IMPLANT
TOWEL OR 17X24 6PK STRL BLUE (TOWEL DISPOSABLE) ×2 IMPLANT
TRAY DSU PREP LF (CUSTOM PROCEDURE TRAY) ×2 IMPLANT
TUBE CONNECTING 20X1/4 (TUBING) ×2 IMPLANT
UNDERPAD 30X30 INCONTINENT (UNDERPADS AND DIAPERS) ×2 IMPLANT

## 2012-12-05 NOTE — Anesthesia Postprocedure Evaluation (Signed)
Anesthesia Post Note  Patient: Dustin Crane  Procedure(s) Performed: Procedure(s) (LRB): RIGHT INDEX FINGER IMPLANT ARTHROPLASTY (Right)  Anesthesia type: MAC  Patient location: PACU  Post pain: Pain level controlled and Adequate analgesia  Post assessment: Post-op Vital signs reviewed, Patient's Cardiovascular Status Stable and Respiratory Function Stable  Last Vitals:  Filed Vitals:   12/05/12 0930  BP: 143/73  Pulse: 71  Temp:   Resp: 17    Post vital signs: Reviewed and stable  Level of consciousness: awake, alert  and oriented  Complications: No apparent anesthesia complications

## 2012-12-05 NOTE — Transfer of Care (Signed)
Immediate Anesthesia Transfer of Care Note  Patient: Dustin Crane  Procedure(s) Performed: Procedure(s): RIGHT INDEX FINGER IMPLANT ARTHROPLASTY (Right)  Patient Location: PACU  Anesthesia Type:MAC and MAC combined with regional for post-op pain  Level of Consciousness: awake and alert   Airway & Oxygen Therapy: Patient Spontanous Breathing and Patient connected to face mask oxygen  Post-op Assessment: Report given to PACU RN and Post -op Vital signs reviewed and stable  Post vital signs: Reviewed and stable  Complications: No apparent anesthesia complications

## 2012-12-05 NOTE — Progress Notes (Signed)
Assisted Dr. Hodierne with right, ultrasound guided, supraclavicular block. Side rails up, monitors on throughout procedure. See vital signs in flow sheet. Tolerated Procedure well.  

## 2012-12-05 NOTE — Anesthesia Procedure Notes (Addendum)
Anesthesia Regional Block:  Supraclavicular block  Pre-Anesthetic Checklist: ,, timeout performed, Correct Patient, Correct Site, Correct Laterality, Correct Procedure, Correct Position, site marked, Risks and benefits discussed,  Surgical consent,  Pre-op evaluation,  At surgeon's request and post-op pain management  Laterality: Right  Prep: chloraprep       Needles:  Injection technique: Single-shot  Needle Type: Echogenic Stimulator Needle     Needle Length: 5cm 5 cm Needle Gauge: 22 and 22 G    Additional Needles:  Procedures: ultrasound guided (picture in chart) and nerve stimulator Supraclavicular block  Nerve Stimulator or Paresthesia:  Response: biceps flexion, 0.45 mA,   Additional Responses:   Narrative:  Start time: 12/05/2012 7:10 AM End time: 12/05/2012 7:26 AM Injection made incrementally with aspirations every 5 mL.  Performed by: Personally  Anesthesiologist: Dr Chaney Malling  Additional Notes: Functioning IV was confirmed and monitors were applied.  A 50mm 22ga Arrow echogenic stimulator needle was used. Sterile prep and drape,hand hygiene and sterile gloves were used.  Negative aspiration and negative test dose prior to incremental administration of local anesthetic. The patient tolerated the procedure well.  Ultrasound guidance: relevent anatomy identified, needle position confirmed, local anesthetic spread visualized around nerve(s), vascular puncture avoided.  Image printed for medical record.   Supraclavicular block Procedure Name: MAC Date/Time: 12/05/2012 7:43 AM Performed by: Caren Macadam Pre-anesthesia Checklist: Patient identified, Emergency Drugs available, Suction available and Patient being monitored Patient Re-evaluated:Patient Re-evaluated prior to inductionOxygen Delivery Method: Simple face mask Preoxygenation: Pre-oxygenation with 100% oxygen Intubation Type: IV induction Placement Confirmation: breath sounds checked- equal and bilateral  and positive ETCO2

## 2012-12-05 NOTE — Brief Op Note (Signed)
12/05/2012  8:56 AM  PATIENT:  Dustin Crane  72 y.o. male  PRE-OPERATIVE DIAGNOSIS:  RIGHT INDEX METACARPALPHALANGEAL DEGENERATIVE JOINT DISEASE  POST-OPERATIVE DIAGNOSIS:  RIGHT INDEX METACARPALPHALANGEAL DEGENERATIVE JOINT DISEASE  PROCEDURE:  Procedure(s): RIGHT INDEX FINGER IMPLANT ARTHROPLASTY  SURGEON:  Surgeon(s): Wyn Forster., MD  PHYSICIAN ASSISTANT:   ASSISTANTS: surgical tech   ANESTHESIA:   regional  EBL:  Total I/O In: 1000 [I.Crane.:1000] Out: -   DRAINS: none   LOCAL MEDICATIONS USED:  ropivacaine  SPECIMEN:  No Specimen  DISPOSITION OF SPECIMEN:  N/A  COUNTS:  YES  TOURNIQUET:   Total Tourniquet Time Documented: Upper Arm (Right) - 47 minutes Total: Upper Arm (Right) - 47 minutes   DICTATION: .Dragon Dictation  PLAN OF CARE: Discharge to home after PACU    Delay start of Pharmacological VTE agent (>24hrs) due to surgical blood loss or risk of bleeding:  not applicable              Dustin Crane is a 72 y.o. male patient.  No diagnosis found. Past Medical History  Diagnosis Date  . Diabetes mellitus without complication   . Hypertension   . Hyperlipemia   . Wears glasses   . Coronary artery disease   . Wears hearing aid     both ears  . GERD (gastroesophageal reflux disease)   . Arthritis    No past surgical history pertinent negatives on file. Scheduled Meds: . chlorhexidine  60 mL Topical Once   Continuous Infusions: . lactated ringers 20 mL/hr at 12/05/12 0653   PRN Meds:fentaNYL, midazolam  Allergies  Allergen Reactions  . Influenza Vac Split [Flu Virus Vaccine] Shortness Of Breath    Rash-severe n/Crane/d  . Compazine [Prochlorperazine Edisylate]     Muscle tightness   Active Problems:   * No active hospital problems. *  Blood pressure 144/69, pulse 77, temperature 98.2 F (36.8 C), temperature source Oral, resp. rate 13, height 5\' 8"  (1.727 m), weight 81.466 kg (179 lb 9.6 oz), SpO2  100.00%.  @IPPOSUB @ @IPPOOBJ @ @IPPOAP @  Dustin Crane,Dustin Crane 12/05/2012 12/05/2012  8:57 AM  PATIENT:  Dustin Crane  72 y.o. male  PRE-OPERATIVE DIAGNOSIS:  RIGHT INDEX METACARPALPHALANGEAL DEGENERATIVE JOINT DISEASE  POST-OPERATIVE DIAGNOSIS:  RIGHT INDEX METACARPALPHALANGEAL DEGENERATIVE JOINT DISEASE  PROCEDURE:  Procedure(s): RIGHT INDEX FINGER IMPLANT ARTHROPLASTY (Right)  SURGEON:  Surgeon(s) and Role:    * Wyn Forster., MD - Primary  PHYSICIAN ASSISTANT:   ASSISTANTS: surgical technician  ANESTHESIA:   regional  EBL:  Total I/O In: 1000 [I.Crane.:1000] Out: -   BLOOD ADMINISTERED:none  DRAINS: none   LOCAL MEDICATIONS USED: Ropivicaine  SPECIMEN:  No Specimen  DISPOSITION OF SPECIMEN:  N/A  COUNTS:  YES  TOURNIQUET:   Total Tourniquet Time Documented: Upper Arm (Right) - 47 minutes Total: Upper Arm (Right) - 47 minutes   DICTATION: .Other Dictation: Dictation Number [id number]  PLAN OF CARE: Discharge to home after PACU  PATIENT DISPOSITION:  PACU - hemodynamically stable.   Delay start of Pharmacological VTE agent (>24hrs) due to surgical blood loss or risk of bleeding: not applicable

## 2012-12-05 NOTE — Anesthesia Preprocedure Evaluation (Signed)
Anesthesia Evaluation  Patient identified by MRN, date of birth, ID band Patient awake    Reviewed: Allergy & Precautions, H&P , NPO status , Patient's Chart, lab work & pertinent test results  Airway Mallampati: II  Neck ROM: full    Dental   Pulmonary former smoker,          Cardiovascular hypertension, + CAD     Neuro/Psych    GI/Hepatic GERD-  ,  Endo/Other  diabetes, Type 2  Renal/GU      Musculoskeletal  (+) Arthritis -,   Abdominal   Peds  Hematology   Anesthesia Other Findings   Reproductive/Obstetrics                           Anesthesia Physical Anesthesia Plan  ASA: III  Anesthesia Plan: General   Post-op Pain Management:    Induction: Intravenous  Airway Management Planned: LMA  Additional Equipment:   Intra-op Plan:   Post-operative Plan:   Informed Consent: I have reviewed the patients History and Physical, chart, labs and discussed the procedure including the risks, benefits and alternatives for the proposed anesthesia with the patient or authorized representative who has indicated his/her understanding and acceptance.     Plan Discussed with: CRNA, Anesthesiologist and Surgeon  Anesthesia Plan Comments:         Anesthesia Quick Evaluation

## 2012-12-05 NOTE — Telephone Encounter (Signed)
From: Sherral Hammers  To: Skipper Cliche, MD  Sent: 12/05/2012 9:12 AM EDT  Subject: Non-Urgent Medical Question    Dr. [name]:  I have a prescription for TRAMADOL HCL 50MG  TAB AMN. Dosage is one tablet by mouth every 12 Hrs as needed for pain. My situation: I had a accident here at home and have broken additional ribs.  My Question: Can I take more than the prescribed dosage of 1 pill every 12 Hrs? Suggest maybe 1 pill every 8 hours or 1 pill every 6 hours? Please advise. I have 3 refills left on existing RX, Quantity 50.  Thanks Andrew [name]

## 2012-12-06 ENCOUNTER — Encounter (HOSPITAL_BASED_OUTPATIENT_CLINIC_OR_DEPARTMENT_OTHER): Payer: Self-pay | Admitting: Orthopedic Surgery

## 2012-12-06 NOTE — Op Note (Signed)
NAME:  Dustin Crane, Dustin Crane NO.:  [id number]  MEDICAL RECORD NO.:  [id number]  LOCATION:                               FACILITY:  [facility]  PHYSICIAN:  Dustin Crane, M.D. 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  POSTOPERATIVE DIAGNOSIS:  Complete loss of hyaline articular cartilage, right index finger metacarpophalangeal joint.  OPERATION:  Implant arthroplasty of right index metacarpophalangeal joint utilizing a [employer] size 5 implant with reconstruction of radial collateral ligament.  OPERATING SURGEON:  Dustin Crane, M.D.  ASSISTANT:  Surgical technician.  ANESTHESIA:  Sedation with right plexus block with ropivacaine.  SUPERVISING ANESTHESIOLOGIST:  Achille Rich, MD.  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 Markus Jarvis, [place] for management of hand arthritis.  He was noted to have generalized osteoarthritis with particular involvement of the right index finger metacarpophalangeal joint.  He could not shake hands and could not pinch without severe pain.  He had tolerated this predicament for several years.  Clinical examination revealed loss of range of motion of the index MP joint, and severe pain with any attempted hyperextension or flexion beyond 60 degrees.  Plain films including [place] view demonstrated bone-on-bone arthropathy.  We advised Dustin Crane he could proceed with a number of choices including injection, arthrodesis, or implant arthroplasty.  After 3 months of consideration, he decided to proceed with implant arthroplasty at this time.  Preoperatively, he was advised of potential risks and benefits of surgery.  Questions were invited and  answered in detail.  PROCEDURE:  Dustin Crane was brought to room 6 of [facility] and placed in supine position on the operating table.  His right hand and index finger had been marked per protocol with a marking pen per the proper surgical site identification protocol.  In room 6 under Dr. Seward Meth supervision, IV sedation was provided.  A ropivacaine plexus block had been placed with ultrasound control in the holding area with excellent anesthesia of the right upper extremity.  The right hand and arm were prepped with Betadine soap and solution, sterilely draped.  A pneumatic tourniquet was applied to the proximal right brachium.  2 g of Ancef administered as an IV prophylactic antibiotic.  Following exsanguination of the right hand and arm with Esmarch bandage, the arterial tourniquet was inflated to 220 mmHg.  A routine surgical time-out was accomplished followed by proceeding with surgery.  A 3-cm curvilinear incision was fashioned over the dorsum of the index MP joint.  The interval between the extensor digitorum longus and the extensor indicis proprius was sharply incised.  The capsule was released as a separate layer followed by gentle release of approximately 20% of the radial collateral ligament, and 50% of the ulnar collateral ligament.  The joint was opened in shotgun style followed by clearing of adhesions.  Bone-on-bone arthropathy was noted.  The metacarpal head was resected with an oscillating saw with a slight radial tilt.  A  rongeur was used to remove osteophytes.  After confirming a satisfactory cup with the C-arm fluoroscope, we used a combination of a bone awl and specific hand rasp to prepare the canal of the proximal phalanx and the metacarpal to accept a size 5 [facility] implant.  The trial implant was placed and C-arm was used to confirm satisfactory alignment.  A 3-0 FiberWire suture was placed with an Accordion-style stitch  to reconstruct the radial collateral ligament.  After irrigation, a size 5 [facility] implant was placed with no-touch technique.  The capsule was then repaired with figure-of-eight sutures of 4-0 Vicryl followed by tensioning of the radial collateral ligament with the Accordion stitch.  A very satisfactory radial deviation and slight supination of the index finger was achieved.  The extensor mechanism was then reconstructed with multiple interrupted sutures of 3-0 Ethibond figure-of-eight style knots buried followed by repair of the skin with subcutaneous 4-0 Vicryl and intradermal 4-0 Prolene.  The hand was placed in compressive dressing with dorsal and palmar plaster sandwich splint protecting the index finger reconstruction.  There were no apparent complications.  For aftercare, Dustin Crane will be provided a prescription for Dilaudid 2 mg 1 or 2 tablets p.o. q.4-6 hours p.r.n. pain, 30 tablets without refill.  We will also provide Keflex 500 mg 1 p.o. q.8 hours x4 days as a prophylactic antibiotic.     Dustin Crane, M.D.   ______________________________ Dustin Crane, M.D.    RVS/MEDQ  D:  12/05/2012  T:  12/06/2012  Job:  [id number]  cc:   Saverio Danker, MD

## 2012-12-06 NOTE — Op Note (Deleted)
NAME:  Reichardt, Izack               ACCOUNT NO.:  [id number]  MEDICAL RECORD NO.:  [id number]  LOCATION:                               FACILITY:  [facility]  PHYSICIAN:  Sonjia Wilcoxson V. Nyajah Hyson, M.D. 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  POSTOPERATIVE DIAGNOSIS:  Complete loss of hyaline articular cartilage, right index finger metacarpophalangeal joint.  OPERATION:  Implant arthroplasty of right index metacarpophalangeal joint utilizing a [employer] size 5 implant with reconstruction of radial collateral ligament.  OPERATING SURGEON:  Sibbie Flammia V. Wilma Michaelson, M.D.  ASSISTANT:  Surgical technician.  ANESTHESIA:  Sedation with right plexus block with ropivacaine.  SUPERVISING ANESTHESIOLOGIST:  Adam Hodierne, MD.  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 Wallace [facility] [place], [place] for management of hand arthritis.  He was noted to have generalized osteoarthritis with particular involvement of the right index finger metacarpophalangeal joint.  He could not shake hands and could not pinch without severe pain.  He had tolerated this predicament for several years.  Clinical examination revealed loss of range of motion of the index MP joint, and severe pain with any attempted hyperextension or flexion beyond 60 degrees.  Plain films including [place] view demonstrated bone-on-bone arthropathy.  We advised Mr. Zwiebel he could proceed with a number of choices including injection, arthrodesis, or implant arthroplasty.  After 3 months of consideration, he decided to proceed with implant arthroplasty at this time.  Preoperatively, he was advised of potential risks and benefits of surgery.  Questions were invited and  answered in detail.  PROCEDURE:  Aviel Zamudio was brought to room 6 of [facility] and placed in supine position on the operating table.  His right hand and index finger had been marked per protocol with a marking pen per the proper surgical site identification protocol.  In room 6 under Dr. Hodierne's supervision, IV sedation was provided.  A ropivacaine plexus block had been placed with ultrasound control in the holding area with excellent anesthesia of the right upper extremity.  The right hand and arm were prepped with Betadine soap and solution, sterilely draped.  A pneumatic tourniquet was applied to the proximal right brachium.  2 g of Ancef administered as an IV prophylactic antibiotic.  Following exsanguination of the right hand and arm with Esmarch bandage, the arterial tourniquet was inflated to 220 mmHg.  A routine surgical time-out was accomplished followed by proceeding with surgery.  A 3-cm curvilinear incision was fashioned over the dorsum of the index MP joint.  The interval between the extensor digitorum longus and the extensor indicis proprius was sharply incised.  The capsule was released as a separate layer followed by gentle release of approximately 20% of the radial collateral ligament, and 50% of the ulnar collateral ligament.  The joint was opened in shotgun style followed by clearing of adhesions.  Bone-on-bone arthropathy was noted.  The metacarpal head was resected with an oscillating saw with a slight radial tilt.  A   rongeur was used to remove osteophytes.  After confirming a satisfactory cup with the C-arm fluoroscope, we used a combination of a bone awl and specific hand rasp to prepare the canal of the proximal phalanx and the metacarpal to accept a size 5 [employer] implant.  The trial implant was placed and C-arm was used to confirm satisfactory alignment.  A 3-0 FiberWire suture was placed with an Accordion-style stitch  to reconstruct the radial collateral ligament.  After irrigation, a size 5 [employer] implant was placed with no-touch technique.  The capsule was then repaired with figure-of-eight sutures of 4-0 Vicryl followed by tensioning of the radial collateral ligament with the Accordion stitch.  A very satisfactory radial deviation and slight supination of the index finger was achieved.  The extensor mechanism was then reconstructed with multiple interrupted sutures of 3-0 Ethibond figure-of-eight style knots buried followed by repair of the skin with subcutaneous 4-0 Vicryl and intradermal 4-0 Prolene.  The hand was placed in compressive dressing with dorsal and palmar plaster sandwich splint protecting the index finger reconstruction.  There were no apparent complications.  For aftercare, Mr. Scaffidi will be provided a prescription for Dilaudid 2 mg 1 or 2 tablets p.o. q.4-6 hours p.r.n. pain, 30 tablets without refill.  We will also provide Keflex 500 mg 1 p.o. q.8 hours x4 days as a prophylactic antibiotic.     Ebb Carelock V. Savahanna Almendariz, M.D.   ______________________________ Advika Mclelland V. Arnold Kester, M.D.    RVS/MEDQ  D:  12/05/2012  T:  12/06/2012  Job:  [id number]  cc:   Wallace [facility], MD 

## 2012-12-07 LAB — POCT I-STAT, CHEM 8
Chloride: 107 mEq/L (ref 96–112)
Creatinine, Ser: 1.1 mg/dL (ref 0.50–1.35)
HCT: 39 % (ref 39.0–52.0)
Hemoglobin: 13.3 g/dL (ref 13.0–17.0)
Potassium: 4.2 mEq/L (ref 3.5–5.1)
Sodium: 140 mEq/L (ref 135–145)
TCO2: 22 mmol/L (ref 0–100)

## 2012-12-20 NOTE — Telephone Encounter (Signed)
From: Sherral Hammers  To: Skipper Cliche, MD  Sent: 12/20/2012 9:58 AM EDT  Subject: Non-Urgent Medical Question    Dr. [name], I Received an E-Mail from My Chart saying that I should check my blood work results.

## 2012-12-29 LAB — COMPREHENSIVE METABOLIC PANEL
ALT: 34 U/L (ref 10–40)
AST: 58 U/L — ABNORMAL HIGH (ref 15–37)
Albumin/Globulin Ratio: 0.8 — ABNORMAL LOW (ref 1.1–2.2)
Albumin: 2.8 g/dL — ABNORMAL LOW (ref 3.4–5.0)
Alkaline Phosphatase: 213 U/L — ABNORMAL HIGH (ref 40–129)
BUN: 16 mg/dL (ref 7–20)
CO2: 28 mmol/L (ref 21–32)
Calcium: 8.9 mg/dL (ref 8.3–10.6)
Chloride: 105 mmol/L (ref 99–110)
Creatinine: 1 mg/dL (ref 0.8–1.3)
GFR African American: >60 (ref >60)
GFR Non-African American: >60 (ref >60)
Globulin: 3.4 g/dL
Glucose: 106 mg/dL — ABNORMAL HIGH (ref 70–99)
Potassium: 4.1 mmol/L (ref 3.5–5.1)
Sodium: 139 mmol/L (ref 136–145)
Total Bilirubin: 1.1 mg/dL — ABNORMAL HIGH (ref 0.0–1.0)
Total Protein: 6.2 g/dL — ABNORMAL LOW (ref 6.4–8.2)

## 2012-12-29 LAB — CBC
Hematocrit: 38.3 % — ABNORMAL LOW (ref 40.5–52.5)
Hemoglobin: 13.1 g/dL — ABNORMAL LOW (ref 13.5–17.5)
MCH: 37 pg — ABNORMAL HIGH (ref 26.0–34.0)
MCHC: 34.2 g/dL (ref 31.0–36.0)
MCV: 108.2 fL — ABNORMAL HIGH (ref 80.0–100.0)
MPV: 10.4 fL (ref 5.0–10.5)
Platelets: 117 10*3/uL — ABNORMAL LOW (ref 135–450)
RBC: 3.54 M/uL — ABNORMAL LOW (ref 4.20–5.90)
RDW: 17.3 % — ABNORMAL HIGH (ref 12.4–15.4)
WBC: 4.7 10*3/uL (ref 4.0–11.0)

## 2013-01-15 MED ORDER — NADOLOL 20 MG PO TABS
20 MG | ORAL_TABLET | ORAL | Status: DC
Start: 2013-01-15 — End: 2013-03-23

## 2013-02-07 NOTE — Patient Instructions (Addendum)
Medicare Well Visit Preventive Plan for Andrew [name]                     Screening Tests ??   Colon Cancer Screen (Colonoscopy)           Test is due 2017   ??   Diabetes Screen            Repeat every 3 years ??   Lipids (Cholesterol)             Repeat every 5 years   Lifestyle ??   Exercise            Try to exercise at least 150 minutes per week. ??   Weight Your Body mass index is 32.23 kg/(m^2).            Your BMI is 25 or greater, which indicates that you are overweight ??   Mylifecheck from the American Heart Association is a useful website: mylifeheck.heart.org to assess and address your cardiovascular risk factors.      Immunizations  ??    Repeat a flu shot yearly in the fall

## 2013-02-07 NOTE — Progress Notes (Signed)
Medicare AWV MA Note    Mini-Cog Screen:  Give patient the following words to remember and ask to repeat- advise patient that he will be asked to recall items later:  APPLE, PENNY, WATCH  Clock Drawing Test (CDT): Have patient draw the face of a clock and put the numbers in the correct positions. Then draw in the hands at ten minutes after eleven.  Score clock drawing test as "normal" if the patient places the correct time and the clock appears grossly normal, or "abnormal."  The patient is then asked to recall the three words. (This should be done after one minute, or after the Clock Drawing Test is complete)    Mini-Cog Scoring: 3/3 words recalled (if score is 0 or 3, do not need to score CDT);  CDT: Not Indicated     MINI-COG INTERPRETATION: 3- Negative screen for dementia    Falls Screen Positive: no

## 2013-02-07 NOTE — Progress Notes (Signed)
Sherral Hammers    Annual Wellness Visit     BD: [birth year]          DOS: 02/07/2013     Assessment and Plan for Preventive Visit                                Medicare Well Visit Preventive Plan for Andrew [name]                     Screening Tests ??   Colon Cancer Screen (Colonoscopy)           Test is due 2017   ??   Diabetes Screen            Repeat every 3 years ??   Lipids (Cholesterol)             Repeat every 5 years   Lifestyle ??   Exercise            Try to exercise at least 150 minutes per week. ??   Weight Your Body mass index is 32.23 kg/(m^2).            Your BMI is 25 or greater, which indicates that you are overweight ??   Mylifecheck from the American Heart Association is a useful website: mylifeheck.heart.org to assess and address your cardiovascular risk factors.      Immunizations  ??    Repeat a flu shot yearly in the fall                 Risks identified from visit/HRA  Hearing loss  Lack of exercise    Medical problems reviewed and plans as outlined:     Patient Active Problem List   Diagnosis   ??? Hypertension  --> good numbers   ??? Erectile dysfunction  --> requested.    ??? Thrombocytopenia  --> stable platelets.    ??? Edema  No changes.    ??? BPH (benign prostatic hyperplasia)  --> frequent urination   ??? Alpha-1-antitrypsin deficiency   ??? Cirrhosis  No other medication changes, found ulcer on EGD, on nexium   ??? Spinal compression fracture  Pain has lessened. Not at spot of fracture.              AWV Exam  Written HRA completed by patient and reviewed.     Current Providers  See Care Team    Cognitive Function Assessment  Normal   Depression Screening   PHQ-2 Results Score: 1  Results tabulated from HRA.   Current or previous history of mood disorder or depression: No      Audit-C Alcohol Screen   Results Score: 0   (Positive >3 Men, >2 Women)  Results tabulated from HRA.  Full Audit test        Fall Screening per MA note    Other Risk factors identified per HRA/History are detailed in the Preventive  Plan above.       HPI detailed above by problem    Urinary symptoms: stable     Review of Systems     A comprehensive review of systems was negative except for:  ?? none  Objective     BP 120/76   Pulse 62   Ht 5' 7.5" (1.715 m)   Wt 209 lb (94.802 kg)   BMI 32.23 kg/m2    Wt Readings from Last 3 Encounters:   02/07/13 209  lb (94.802 kg)   08/08/12 198 lb (89.812 kg)   04/19/12 200 lb (90.719 kg)      BP Readings from Last 3 Encounters:   02/07/13 120/76   08/08/12 118/70   04/19/12 120/84          Physical Exam     Constitutional:  Appears well-developed and well-nourished. No acute distress.  Eyes: Sclera white, conjunctiva clear. No lid lag. PERRL  Mouth: Oropharynx is clear and moist. No lesions evident.  Neck: Full ROM, trachea midline. No thyromegaly present. No cervical adenopathy  Cardiovascular: Normal rate, regular rhythm and normal heart sounds.    No murmur heard. No peripheral edema. No carotid bruit.  Resp: Effort normal and breath sounds normal. No respiratory distress. There are no wheezes or rales.   GI: Normal bowel sounds, abdomen soft nontender without masses or hepatosplenomegaly   Musk: Gait coordinated and smooth. No clubbing or cyanosis.  Skin: Warm and dry normal turgor. No rashes, lesions or ulcers.  Neuro: Cranial nerves are normal.Mental status normal.    Gait normal.  Cerebellar function is normal.   Motor function of extremities symmetric and without weakness.   Psychiatric:Patient is alert and oriented to person, place, and time.  Normal mood and affect.   GU: rectal normal, no masses, prostate normal in size without masses or nodules, guaiac negative stool

## 2013-02-15 MED ORDER — FUROSEMIDE 40 MG PO TABS
40 MG | ORAL_TABLET | ORAL | Status: DC
Start: 2013-02-15 — End: 2013-05-03

## 2013-02-23 MED ORDER — AMILORIDE HCL 5 MG PO TABS
5 MG | ORAL_TABLET | ORAL | Status: DC
Start: 2013-02-23 — End: 2013-05-24

## 2013-03-23 MED ORDER — NADOLOL 20 MG PO TABS
20 MG | ORAL_TABLET | ORAL | Status: DC
Start: 2013-03-23 — End: 2013-11-20

## 2013-04-09 MED ORDER — TRAMADOL HCL 50 MG PO TABS
50 MG | ORAL_TABLET | ORAL | Status: DC
Start: 2013-04-09 — End: 2013-05-07

## 2013-04-09 NOTE — Telephone Encounter (Signed)
Last ov:02/07/2013  Last refill: 09/05/2012 with 5 refills.

## 2013-04-12 NOTE — Telephone Encounter (Signed)
From: [name]  To: Rolland Porter, MD  Sent: 04/12/2013 4:29 PM EST  Subject: Non-Urgent Medical Question    Dr. Kelby Aline, did you receive a fax from Dr. Kathlen Brunswick concerning insurance approval on a back brace? Waiting for your response.  Andrey Spearman.

## 2013-04-13 NOTE — Telephone Encounter (Signed)
done

## 2013-04-27 NOTE — Telephone Encounter (Signed)
Patient needs a prescription for a back brace with the following information on it:  Back Brace=Lower Lumbar [phone]  And it needs to state that diagnoses code:  He has Spinal Compression Fracture & Bulging Disc.  Please send to: [facility]  Phone # is [phone]  Fax # [phone]  Patient has an appointment this Tuesday.  Aware doctor  is out of the office today

## 2013-04-30 NOTE — Telephone Encounter (Signed)
done

## 2013-05-01 NOTE — Telephone Encounter (Signed)
[facility] called to get a new order for the pt's lumbar brace. The one we sent over was LO637. They only have one for LO631. Need it to be changed to LO631. Also they need to know what the level of compression fracture it is. Need to send this to his insurance company so they can pay for it.   The pt was there at the store during the phone call. He will process this now under the correct order number. Please fax over new code and level of compression so he can send it to the insurance company. FAX #[phone].

## 2013-05-01 NOTE — Telephone Encounter (Signed)
He should contact [facility] and see if this is really a good idea. i am not familiar with braces and this has become a more involved process than I was led to believe by the chiropractor

## 2013-05-01 NOTE — Telephone Encounter (Signed)
[facility] called back. Said that the current code would not address the compression fracture. Would need one that has shoulder straps. That order # would be LO456. This is the only code needed. Please fax it over.

## 2013-05-01 NOTE — Telephone Encounter (Signed)
He needs to see a specialist to make sure this is the right brace. I don't know what I am doing  He saw one in the past. Not a chiropractor

## 2013-05-01 NOTE — Telephone Encounter (Signed)
Patient notified of Dr. Sarina Ill response.

## 2013-05-01 NOTE — Telephone Encounter (Signed)
Patient is at the shop now getting fitted for brace. Patient states the code just needs to be changed to L0456 and that the chiropractor said this is right and will help his back.

## 2013-05-03 MED ORDER — FUROSEMIDE 40 MG PO TABS
40 MG | ORAL_TABLET | ORAL | Status: DC
Start: 2013-05-03 — End: 2013-08-13

## 2013-05-07 MED ORDER — TRAMADOL HCL 50 MG PO TABS
50 MG | ORAL_TABLET | Freq: Two times a day (BID) | ORAL | Status: DC | PRN
Start: 2013-05-07 — End: 2013-08-13

## 2013-05-07 NOTE — Telephone Encounter (Signed)
Last ov:02/07/2013  Last refill:  04/07/2013

## 2013-05-07 NOTE — Telephone Encounter (Signed)
From: [name]  To: Rolland Porter, MD  Sent: 05/07/2013 1:01 PM EST  Subject: Medication Renewal Request    Original authorizing provider: Rolland Porter, MD    [name] would like a refill of the following medications:  traMADol (ULTRAM) 50 MG tablet Rolland Porter, MD]    Preferred pharmacy: [facility], [place] [phone] - F [phone]    Comment:

## 2013-05-24 MED ORDER — AMILORIDE HCL 5 MG PO TABS
5 MG | ORAL_TABLET | ORAL | Status: DC
Start: 2013-05-24 — End: 2013-08-13

## 2013-06-12 MED ORDER — LACTULOSE 10 GM/15ML PO SOLN
10 GM/15ML | ORAL | Status: DC
Start: 2013-06-12 — End: 2013-06-29

## 2013-06-29 MED ORDER — LACTULOSE 20 GM/30ML PO SOLN
20 GM/30ML | Freq: Three times a day (TID) | ORAL | Status: DC
Start: 2013-06-29 — End: 2013-08-13

## 2013-06-29 NOTE — Telephone Encounter (Signed)
Will let wife know

## 2013-06-29 NOTE — Telephone Encounter (Signed)
I reordered it as a larger amount

## 2013-06-29 NOTE — Telephone Encounter (Signed)
Pt's wife called with questions about lactulose (CHRONULAC) 10 GM/15ML solution. Pt's wife states that he only received one "little bottle" (unable to tell me the mLs in bottle), not the 8 large bottles that usually arrive along with it at a time. She contacted [facility] and was able to get them to send pt another "small bottle". Please reach [facility] to figure out the confusion and contact pt's wife if necessary. Thank you!

## 2013-07-06 ENCOUNTER — Encounter

## 2013-08-09 DIAGNOSIS — E782 Mixed hyperlipidemia: Secondary | ICD-10-CM | POA: Insufficient documentation

## 2013-08-10 NOTE — Telephone Encounter (Signed)
From: [name]  To: Rolland Porter, MD  Sent: 08/10/2013 10:29 AM EDT  Subject: Medication Renewal Request    Original authorizing provider: Rolland Porter, MD    [name] would like a refill of the following medications:  traMADol (ULTRAM) 50 MG tablet Rolland Porter, MD]    Preferred pharmacy: [facility], [place] [phone] - F [phone]    Comment:

## 2013-08-10 NOTE — Telephone Encounter (Signed)
Last ov: 02/07/2013  Last refill: 05/07/2013

## 2013-08-13 MED ORDER — LACTULOSE 20 GM/30ML PO SOLN
20 GM/30ML | Freq: Two times a day (BID) | ORAL | Status: DC
Start: 2013-08-13 — End: 2014-09-19

## 2013-08-13 MED ORDER — TRAMADOL HCL 50 MG PO TABS
50 MG | ORAL_TABLET | Freq: Two times a day (BID) | ORAL | Status: DC | PRN
Start: 2013-08-13 — End: 2013-11-16

## 2013-08-13 NOTE — Progress Notes (Addendum)
[facility]  Internal Medicine  Patient Encounter  Andrew [name], D.O., [facility]        Chief Complaint   Patient presents with   ??? Check-Up       HPI: 73 y.o. male, former pt of Dr. Kelby Aline and before him, Dr. Jerrye Beavers seen today for follow up regarding chronic medical problems.  He is requesting Tramadol.  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  He had an MRI Lumbar [place] in Feb, 2014--   IMPRESSION:      1. Progressive, now marked compressive changes to the L1 vertebral   body with mild retropulsion as described above, this as compared   to an earlier plain film study of 11/30/2011. There is no   significant associated spinal or foraminal stenosis at the T12-L1   or L1-L2 disc spaces.      2. No evidence of significant disc space narrowing, disc   herniation or spinal stenosis L1 through S1. There is mild left   foraminal narrowing at L5-S1.      3. No additional vertebral body fractures in the lower thoracic or   lumbar spine as included on this study.     He is now seeing Dr. Areta Haber (Has a new name) [facility]).  He also has disc disease.  He has chronic low back pain.  Pain is worse with standing and walking.  He feels better sitting in his recliner.  He has had a DEXA scan.  This new neurosurgeon scheduled a new MRI and new DEXA.  His colonoscopy is UTD.  I do not see a PSA.  He was supposed to wear a brace.   Lumbar X-ray 07/06/2013--   FINDINGS:   The lumbar spine is osteopenic. There is a severe compression of   L1 which is stable. There is a mild anterior compression of L2   which is new in the interval. There is a moderate compression of   L3 which is new in the interval. L4 vertebral body height is   preserved. There is a mild L5 compression fracture, progressive in   the interval.      No abnormal motion is seen upon flexion or extension.         Impression   IMPRESSION:      Multiple osteoporotic type compression fractures with new or   progressive  lesions at L2, L3 and L5 since 04/28/2012.        Dr. Eddie North ordered a CBC-- Still shows a pancytopenia with macrocytosis.    HTN-- BP checks at home:No   Pt denies blurred vision, chest pain, dyspnea, headache, orthopnea and palpitations.  Pt complains of peripheral edema.  He may have some SOB with exertion.  Tolerating medications: Yes.  Pt does not exercise regularly.  He adheres to low salt.      Thrombocytopenia-- Has a Macrocytic anemia and Thrombocytopenia.  He has known cirrhosis.  He sees Dr. Einar Grad every 6 months.      Alpha 1 antitrypsin deficiency-- Heterozygote.  He does have cirrhosis, but biopsy did not show PAS positive areas.  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  He does not drink alcohol.  He uses the Lactulose BID.  He is on Rifaxamin.  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      IFG-- No Polyuria or polydipsia.  No weight changes.    No results found for this basename: LABA1C     No  results found for this basename: eAG          Medical/Surgical Histories     Past Medical History   Diagnosis Date   ??? Hypertension    ??? Kidney stones    ??? Alpha 1 antitrypsin deficiency    ??? Thrombocytopenia ([place]) 04/30/2009   ??? BPH (benign prostatic hyperplasia)           Past Surgical History   Procedure Laterality Date   ??? Av fistula repair  1970's   ??? Colonoscopy  4/12     5y           Medications/Allergies     Medication Sig   ??? aMILoride (MIDAMOR) 5 MG tablet Take 5 mg by mouth daily   ??? furosemide (LASIX) 40 MG tablet Take 40 mg by mouth daily Take 1/2 tab qd   ??? lactulose 20 GM/30ML SOLN Take 30 mLs by mouth 3 times daily   ??? traMADol (ULTRAM) 50 MG tablet Take 1 tablet by mouth every 12 hours as needed for Pain.   ??? nadolol (CORGARD) 20 MG tablet TAKE 1 TABLET DAILY   ??? Cholecalciferol (VITAMIN D3) 1000 UNITS CAPS Take  by mouth.   ??? MILK THISTLE PO Take  by mouth.   ??? rifaximin (XIFAXAN) 550 MG tablet Take 550 mg by mouth 2 times daily.     ??? Multiple Vitamins-Minerals (CENTRUM SILVER PO) Take  by mouth  daily.         No Known Allergies      Substance Use History     History   Substance Use Topics   ??? Smoking status: Never Smoker    ??? Smokeless tobacco: Never Used      Comment: Passive smoke exposure: yes   ??? Alcohol Use: No          Family History     Family History   Problem Relation Age of Onset   ??? Diabetes Mother    ??? Diabetes Sister    ??? Diabetes Brother          Review of Systems - As per HPI      OBJECTIVE:  BP 124/80    Pulse 66    Resp 12    Ht 5\' 5"  (1.651 m)    Wt 196 lb (88.905 kg)    BMI 32.62 kg/m2     GEN: NAD, A&O, Non-toxic  HEENT: NC/AT, PERL, EOMI, Oral cavity Clear,  TM's NL, Nasal cavity clear.    NECK: Supple.  No thyromegaly.  LYMPH: No C/SC nodes.  CV: S1 S2 NL, RRR.  No murmurs, clicks or rubs.  PULM: CTA, symmentric air exchange  EXT:  BL LE 2-3+ edema.   GI: Abdomen is soft, NT, BS+, No hepatomegaly.  NEURO: No focal or lateralizing deficits.  VASC:  No carotid bruits.  Pulses symmetric  MS:  Tenderness with palpation of the lumbar paraspinals.  Increased soft tissue tone and spasm noted.  ROM is limited in all plains.   BREAST:  + Gynecomastia    ASSESSMENT::  Encounter Diagnoses   Name Primary?   ??? Hypertension Yes   ??? Cirrhosis    ??? Lumbar compression fracture ([place])    ??? Thrombocytopenia ([place])    ??? Macrocytic anemia    ??? Chronic low back pain    ??? Osteopenia    ??? Screening for prostate cancer    ??? Macrocytosis    ??? Abnormal EKG    ??? Bilateral  leg edema    ??? SOB (shortness of breath)        Plan:  1. Agree with DEXA and MRI  2. Will recheck LAB with SPEP and UPEP and PSA  3. Obtain Results and office note from Neurosurgeon  4. Refill Tramadol-- Side effects discussed.  Cautioned about using heavy machinery and driving in conjunction with the use of this medication.  Advised of the potential for abuse and habit forming nature of this medication.  Patient advised to call with any concerns regarding medication.     5. OARRS  6. Echo    42 minutes spent with the pt. 35 min spent reviewing  test results and discussing plan of care and counseled on above problems (ALL).      Controlled Substances Monitoring: Attestation: The Prescription Monitoring Report for this patient was reviewed today. (Gerardine Peltz A Amy Belloso, DO)  Documentation: Possible medication side effects, risk of tolerance and/or dependence, and alternative treatments discussed, No signs of potential drug abuse or diversion identified. ([place] A Miu Chiong, DO)     Discussed medications with patient who voiced understanding of their use, indication and potential side effects.  Pt also understands the above recommendations.   All questions answered.

## 2013-08-13 NOTE — Patient Instructions (Signed)
Learning About High Blood Pressure  What is high blood pressure?     Blood pressure is a measure of how hard the blood pushes against the walls of your arteries. It's normal for blood pressure to go up and down throughout the day, but if it stays up, you have high blood pressure. Another name for high blood pressure is hypertension.  Two numbers tell you your blood pressure. The first number is the systolic pressure. It shows how hard the blood pushes when your heart is pumping. The second number is the diastolic pressure. It shows how hard the blood pushes between heartbeats, when your heart is relaxed and filling with blood.  A blood pressure of less than 120/80 (say "120 over 80") is ideal for an adult. High blood pressure is 140/90 or higher. Many people fall into the category in between, called prehypertension. People with prehypertension need to make lifestyle changes to bring their blood pressure down and help prevent or delay high blood pressure.  What happens when you have high blood pressure?  ?? Blood flows through your arteries with too much force. Over time, this damages the walls of your arteries. But you can't feel it. High blood pressure usually doesn't cause symptoms.  ?? Fat and calcium start to build up in your arteries. This buildup is called plaque. Plaque makes your arteries narrower and stiffer. Blood can't flow through them as easily.  ?? This lack of good blood flow starts to damage some of the organs in your body. This can lead to problems such as coronary artery disease and heart attack, heart failure, stroke, kidney failure, and eye damage.  How can you prevent high blood pressure?  ?? Stay at a healthy weight.  ?? Try to limit how much sodium you eat to less than 2,300 milligrams (mg) a day. And try to limit the sodium you eat to less than 1,500 mg a day if you are 51 or older, are black, or have high blood pressure, diabetes, or chronic kidney disease.  ?? Buy foods that are labeled  "unsalted," "sodium-free," or "low-sodium." Foods labeled "reduced-sodium" and "light sodium" may still have too much sodium.  ?? Flavor your food with garlic, lemon juice, onion, vinegar, herbs, and spices instead of salt. Do not use soy sauce, steak sauce, onion salt, garlic salt, mustard, or ketchup on your food.  ?? Use less salt (or none) when recipes call for it. You can often use half the salt a recipe calls for without losing flavor.  ?? Be physically active. Get at least 30 minutes of exercise on most days of the week. Walking is a good choice. You also may want to do other activities, such as running, swimming, cycling, or playing tennis or team sports.  ?? Limit alcohol to 2 drinks a day for men and 1 drink a day for women.  ?? Eat plenty of fruits, vegetables, and low-fat dairy products. Eat less saturated and total fats.  How is high blood pressure treated?  ?? Your doctor will suggest making lifestyle changes. For example, your doctor may ask you to eat healthy foods, quit smoking, lose extra weight, and be more active.  ?? If lifestyle changes don't help enough or your blood pressure is very high, you will have to take medicine every day.  Follow-up care is a key part of your treatment and safety. Be sure to make and go to all appointments, and call your doctor if you are having problems. It's also   a good idea to know your test results and keep a list of the medicines you take.   Where can you learn more?   Go to [email or web address] and sign in to your MyChart account. Enter P501 in the Search Health Information box to learn more about ???Learning About High Blood Pressure.???    If you do not have an account, please click on the ???Sign Up Now??? link.     ?? 2006-2015 [employer]. Care instructions adapted under license by [facility]. This care instruction is for use with your licensed healthcare professional. If you have questions about a medical condition or this instruction,  always ask your healthcare professional. Healthwise, Incorporated disclaims any warranty or liability for your use of this information.  Content Version: 10.4.390249; Current as of: May 17, 2012

## 2013-08-13 NOTE — Progress Notes (Signed)
Reviewed OTC Meds?  Yes  Patient understands current medications?  Yes  Any resources/referrals provided to patient?  yes - HTN  Any tools provided to patient?  no

## 2013-08-13 NOTE — Telephone Encounter (Signed)
From: [name]  To: Alecia Lemming, DO  Sent: 08/11/2013 6:36 PM EDT  Subject: Non-Urgent Medical Question    My Doctor is Dr. Susie Cassette. Why did he deny my prescription refill request? Why did he not prescribe the medication that I requested?    Andrew [name].

## 2013-08-13 NOTE — Addendum Note (Signed)
Addended by: Gwinda Maine A on: 08/13/2013 01:54 PM     Modules accepted: Orders

## 2013-08-15 LAB — ELECTROPHORESIS PROTEIN URINE 24HR
24hr Urine Volume (ml): 1050 mL
Protein, 24H Urine: 0.074 g/(24.h) (ref <0.149)

## 2013-08-15 LAB — PROTEIN, URINE, 24 HOUR: Creatinine, 24H Ur: 1.3 g/(24.h) (ref 0.6–2.5)

## 2013-08-16 LAB — COMPREHENSIVE METABOLIC PANEL
ALT: 28 U/L (ref 10–40)
AST: 56 U/L — ABNORMAL HIGH (ref 15–37)
Albumin/Globulin Ratio: 0.7 — ABNORMAL LOW (ref 1.1–2.2)
Albumin: 2.4 g/dL — ABNORMAL LOW (ref 3.4–5.0)
Alkaline Phosphatase: 148 U/L — ABNORMAL HIGH (ref 40–129)
Anion Gap: 10 (ref 3–16)
BUN: 13 mg/dL (ref 7–20)
CO2: 25 mmol/L (ref 21–32)
Calcium: 8.2 mg/dL — ABNORMAL LOW (ref 8.3–10.6)
Chloride: 105 mmol/L (ref 99–110)
Creatinine: 0.6 mg/dL — ABNORMAL LOW (ref 0.8–1.3)
GFR African American: >60 (ref >60)
GFR Non-African American: >60 (ref >60)
Globulin: 3.4 g/dL
Glucose: 82 mg/dL (ref 70–99)
Potassium: 4.6 mmol/L (ref 3.5–5.1)
Sodium: 140 mmol/L (ref 136–145)
Total Bilirubin: 1.6 mg/dL — ABNORMAL HIGH (ref 0.0–1.0)

## 2013-08-16 LAB — C-REACTIVE PROTEIN: CRP: 27.6 mg/L — ABNORMAL HIGH (ref 0.0–5.1)

## 2013-08-16 LAB — VITAMIN B12 & FOLATE
Folate: 18.75 ng/mL — ABNORMAL HIGH (ref 3.10–17.50)
Vitamin B-12: >2000 pg/mL — ABNORMAL HIGH (ref 211–911)

## 2013-08-16 LAB — TSH: TSH: 2.15 u[IU]/mL (ref 0.27–4.20)

## 2013-08-16 LAB — PSA SCREENING: PSA: 0.55 ng/mL (ref 0.00–4.00)

## 2013-08-16 LAB — VITAMIN D 25 HYDROXY: Vit D, 25-Hydroxy: 43 ng/mL (ref >=30)

## 2013-08-16 LAB — SEDIMENTATION RATE: Sed Rate: 32 mm/Hr — ABNORMAL HIGH (ref 0–20)

## 2013-08-17 LAB — ELECTROPHORESIS PROTEIN, SERUM
Albumin: 2.3 g/dL — ABNORMAL LOW (ref 3.1–4.9)
Alpha-1-Globulin: 0.1 g/dL — ABNORMAL LOW (ref 0.2–0.4)
Alpha-2-Globulin: 0.4 g/dL (ref 0.4–1.1)
Beta Globulin: 0.7 g/dL — ABNORMAL LOW (ref 0.9–1.6)
Gamma Globulin: 2.2 g/dL — ABNORMAL HIGH (ref 0.6–1.8)
Total Protein: 5.8 g/dL — ABNORMAL LOW (ref 6.4–8.2)

## 2013-08-17 LAB — PATH INTERP ELEC URINE

## 2013-08-21 ENCOUNTER — Encounter

## 2013-08-21 MED ORDER — GADOVERSETAMIDE 330.9 MG/ML IV SOLN
330.9 MG/ML | Freq: Once | INTRAVENOUS | Status: AC | PRN
Start: 2013-08-21 — End: 2013-08-21
  Administered 2013-08-21: 16:00:00 20 mL via INTRAVENOUS

## 2013-08-21 MED ADMIN — sodium chloride flush 0.9 % injection 10 mL: 10 mL | INTRAVENOUS | @ 16:00:00

## 2013-08-21 MED FILL — SODIUM CHLORIDE 0.9 % IV SOLN: 0.9 % | INTRAVENOUS | Qty: 200

## 2013-09-05 ENCOUNTER — Encounter

## 2013-09-14 NOTE — Telephone Encounter (Signed)
From: [name]  To: Alecia Lemming, DO  Sent: 09/13/2013 7:55 PM EDT  Subject: Non-Urgent Medical Question    Dr. Susie Cassette     I have not heard from the Cardiologist yet. You told us to call you if we don't hear from him/her in a reasonably time.    [name]

## 2013-09-25 MED ORDER — LISINOPRIL 5 MG PO TABS
5 MG | ORAL_TABLET | Freq: Every evening | ORAL | Status: DC
Start: 2013-09-25 — End: 2013-10-29

## 2013-09-25 NOTE — Patient Instructions (Signed)
RHC with possible stent next Tuesday   Follow up on August 11,2015 with DD or CR CNP.

## 2013-09-25 NOTE — Progress Notes (Signed)
[place]   Cardiac Evaluation      Andrew [name]  Date of Birth:  [birth year]    Date of Visit:  09/25/2013      Chief Complaint   Patient presents with   ??? 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 Foot Swelling        History of Present Illness: Patient is a 73 year old male seen at the request of Dr. Susie Cassette for consultation of cardiomyopathy. He had and echo for an abnormal ECG, Dyspnea/SOB and Edema.  His pcp is new to him who got an echo for baseline to his history and after getting the results he was referred to heart failure. He is retired and has more of a sediment life style. He has had chronic leg swelling. He can not sleep lying flat; he sleeps in a recliner. He does not elevate his legs. He does not wear compression hose. He is not aware of chest tightness or MI. He has no known family history of heart disease. Father died at age 59 of old age, his mother died of liver disease at age 92, and a brother died of leukemia. He has Cirrhosis and is managed medically by Dr. Lucita Ferrara. He states that the liver function is stable. He takes nadolol. He does not smoke, no history of smoking. He quit drinking alcohol in 1973. His liver problem is thought to be from alpha 1 amatrypsin, he and his daughter has the contributing factors.he has been on Lisinopril for his BP and it was changed to add a water pill in combination. Discussed cardiac angiogram benefits, risks, and alternatives. All questions answered for he and his son who is present.         No Known Allergies  Current Outpatient Prescriptions   Medication Sig Dispense Refill   ??? aMILoride (MIDAMOR) 5 MG tablet Take 5 mg by mouth daily       ??? furosemide (LASIX) 40 MG tablet Take 40 mg by mouth daily Take 1/2 tab qd       ??? traMADol (ULTRAM) 50 MG tablet Take 1 tablet by mouth every 12 hours as needed for Pain  60 tablet  2   ??? lactulose 20 GM/30ML SOLN Take 30 mLs by mouth 2 times daily  5400 mL  1   ??? nadolol  (CORGARD) 20 MG tablet TAKE 1 TABLET DAILY  90 tablet  1   ??? Cholecalciferol (VITAMIN D3) 1000 UNITS CAPS Take  by mouth.       ??? MILK THISTLE PO Take  by mouth.       ??? rifaximin (XIFAXAN) 550 MG tablet Take 550 mg by mouth 2 times daily.         ??? Multiple Vitamins-Minerals (CENTRUM SILVER PO) Take  by mouth daily.         No current facility-administered medications for this visit.       Past Medical History   Diagnosis Date   ??? Hypertension    ??? Kidney stones    ??? Alpha 1 antitrypsin deficiency    ??? Thrombocytopenia ([place]) 04/30/2009   ??? BPH (benign prostatic hyperplasia)    ??? Cirrhosis, nonalcoholic ([place])      Due to NASH     Past Surgical History   Procedure Laterality Date   ??? Av fistula repair  1970's   ??? Colonoscopy  4/12     5y  Family History   Problem Relation Age of Onset   ??? Diabetes Mother    ??? Diabetes Sister    ??? Diabetes Brother      History     Social History   ??? Marital Status: Married     Spouse Name: N/A     Number of Children: N/A   ??? Years of Education: N/A     Occupational History   ??? Not on file.     Social History Main Topics   ??? Smoking status: Never Smoker    ??? Smokeless tobacco: Never Used      Comment: Passive smoke exposure: yes   ??? Alcohol Use: No   ??? Drug Use: No   ??? Sexual Activity: Not on file     Other Topics Concern   ??? Not on file     Social History Narrative       Review of Systems:   ?? Constitutional: there has been no unanticipated weight loss. There's been no change in energy level, sleep pattern, or activity level.     ?? Eyes: No visual changes or diplopia. No scleral icterus.  ?? ENT: No Headaches, hearing loss or vertigo. No mouth sores or sore throat.  ?? Cardiovascular: Reviewed in HPI  ?? Respiratory: No cough or wheezing, no sputum production. No hematemesis.    ?? Gastrointestinal: No abdominal pain, appetite loss, blood in stools. No change in bowel or bladder habits.  ?? Genitourinary: No dysuria, trouble voiding, or hematuria.  ?? Musculoskeletal:  No gait  disturbance, weakness or joint complaints.  ?? Integumentary: No rash or pruritis.  ?? Neurological: No headache, diplopia, change in muscle strength, numbness or tingling. No change in gait, balance, coordination, mood, affect, memory, mentation, behavior.  ?? Psychiatric: No anxiety, no depression.  ?? Endocrine: No malaise, fatigue or temperature intolerance. No excessive thirst, fluid intake, or urination. No tremor.  ?? Hematologic/Lymphatic: No abnormal bruising or bleeding, blood clots or swollen lymph nodes.  ?? Allergic/Immunologic: No nasal congestion or hives.    Physical Examination:    Filed Vitals:    09/25/13 0913   BP: 124/72   Pulse: 71   Weight: 200 lb (90.719 kg)   SpO2: 96%     Body mass index is 33.28 kg/(m^2).     Wt Readings from Last 3 Encounters:   09/25/13 200 lb (90.719 kg)   08/13/13 196 lb (88.905 kg)   02/07/13 209 lb (94.802 kg)     BP Readings from Last 3 Encounters:   09/25/13 124/72   08/13/13 124/80   02/07/13 120/76     Constitutional and General Appearance:   WD/WN in NAD  HEENT:  NC/AT  Respiratory:  ?? Normal excursion and expansion without use of accessory muscles  ?? Resp Auscultation: Normal breath sounds without dullness  Cardiovascular:  ?? The apical impulses not displaced  ?? Heart tones are crisp and normal  ?? Cervical veins are not engorged  ?? The carotid upstroke is normal in amplitude and contour without delay or bruit  ?? JVP less than 8 cm H2O  RRR with nl S1 and S2 without m,r,g  ?? Peripheral pulses are symmetrical and full  ?? There is no clubbing, cyanosis of the extremities.  ?? No edema  ?? Femoral Arteries: 2+ and equal  ?? Pedal Pulses: 2+ and equal   Abdomen:  ?? No masses or tenderness  ?? Liver/Spleen: No Abnormalities Noted  Neurological/Psychiatric:  ?? Alert and oriented in  all spheres  ?? Moves all extremities well  ?? Exhibits normal gait balance and coordination  ?? No abnormalities of mood, affect, memory, mentation, or behavior are noted    Echo 09/05/2013--  Dilated  left ventricle. Normal wall thickness. Decreased left ventricular systolic function with an estimated EF 30-35%.Inferior wall hypokinesis E/e=25. Mild to moderate mitral regurgitation. The left atrium is dilated. Trace of aortic insufficiency. Mild tricuspid regurgitation with RVSP estimated at 36 mmHg.The aorta is within normal limits.    Assessment:    Systolic CHF, chronic --leg swelling is up. Has been taking furosemide. Echo 09/05/2013 shows EF 30-35%, mild-mod MR, RVSP 36 mmHg.    Plan:  RHC and LHC next Wednesday  Follow up on August 11,2015 with DD or CR CNP.    I appreciate the opportunity of cooperating in the care of this patient.    Marissa Nestle, M.D., [facility]

## 2013-10-03 LAB — WBC: WBC: 4.8 10*3/uL (ref 4.0–11.0)

## 2013-10-03 LAB — PLATELET COUNT: Platelets: 124 10*3/uL — ABNORMAL LOW (ref 135–450)

## 2013-10-03 MED FILL — HEPARIN SODIUM (PORCINE) 1000 UNIT/ML IJ SOLN: 1000 UNIT/ML | INTRAMUSCULAR | Qty: 10

## 2013-10-03 MED FILL — ADENOSCAN 3 MG/ML IV SOLN: 3 MG/ML | INTRAVENOUS | Qty: 30

## 2013-10-03 MED FILL — SODIUM CHLORIDE 0.9 % IV SOLN: 0.9 % | INTRAVENOUS | Qty: 1000

## 2013-10-03 MED FILL — HEPARIN (PORCINE) IN NACL 2-0.9 UNIT/ML-% IJ SOLN: INTRAMUSCULAR | Qty: 1000

## 2013-10-03 MED FILL — FENTANYL CITRATE 0.05 MG/ML IJ SOLN: 0.05 MG/ML | INTRAMUSCULAR | Qty: 2

## 2013-10-03 MED FILL — LIDOCAINE HCL (PF) 1 % IJ SOLN: 1 % | INTRAMUSCULAR | Qty: 30

## 2013-10-03 MED FILL — MIDAZOLAM HCL 2 MG/2ML IJ SOLN: 2 MG/ML | INTRAMUSCULAR | Qty: 2

## 2013-10-03 MED FILL — SODIUM CHLORIDE 0.9 % IV SOLN: 0.9 % | INTRAVENOUS | Qty: 50

## 2013-10-03 NOTE — Discharge Instructions (Addendum)
LEFT HEART CATHETERIZATION    Care of your puncture site:   Remove bandage 24 hours after the procedure.   May shower in 24 hours but do not sit in a bathtub/pool of water for 5 days or until the wound is healed.   Inspect the site daily and gently clean using soap and water while standing in the shower.   Dry thoroughly and apply a Band-Aid that covers the entire site. Do not apply powder or lotion.    Normal Observations:   Soreness or tenderness which may last one week.   Mild oozing from the incision site.   Possible bruising that could last 2 weeks.    Activity:   You may resume driving 24 hours following the procedure.   You may resume normal activity in 5 days or after the wound heals.   Avoid lifting more than 10 pounds for 5 days or until the wound heals.   Avoid strenuous exercise or activity for 1 week.    Nutrition:   Regular diet .   Drink at least 8 to 10 glasses of decaffeinated, non-alcoholic fluid for the next 24 hours to flush the x-ray dye used for your angiogram out of your body.    Call your doctor immediately if your condition worsens, for any other concerns, for a follow-up appointment or if you experience any of the following:   Significant bleeding that does not stop after 10 minutes of applying firm pressure on the puncture site.   Increased swelling on the groin or leg.   Unusual pain, numbness, or tingling of the groin or down the leg.   Any signs of infection such as: redness, yellow drainage at the site, swelling or pain.

## 2013-10-03 NOTE — H&P (Signed)
Andrew [name]  Cardiac Evaluation      Andrew [name]  Date of Birth: [birth year]    Date of Visit: 09/25/2013    Chief Complaint    Patient presents with    ???  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  Foot Swelling         History of Present Illness: Patient is a 73 year old male seen at the request of Dr. Susie Cassette for consultation of cardiomyopathy. He had and echo for an abnormal ECG, Dyspnea/SOB and Edema.  His pcp is new to him who got an echo for baseline to his history and after getting the results he was referred to heart failure. He is retired and has more of a sediment life style. He has had chronic leg swelling. He can not sleep lying flat; he sleeps in a recliner. He does not elevate his legs. He does not wear compression hose. He is not aware of chest tightness or MI. He has no known family history of heart disease. Father died at age 32 of old age, his mother died of liver disease at age 67, and a brother died of leukemia. He has Cirrhosis and is managed medically by Dr. Lucita Ferrara. He states that the liver function is stable. He takes nadolol. He does not smoke, no history of smoking. He quit drinking alcohol in 1973. His liver problem is thought to be from alpha 1 amatrypsin, he and his daughter has the contributing factors.he has been on Lisinopril for his BP and it was changed to add a water pill in combination. Discussed cardiac angiogram benefits, risks, and alternatives. All questions answered for he and his son who is present.         No Known Allergies   Current Facility-Administered Medications    Current Outpatient Prescriptions    Medication  Sig  Dispense  Refill    ???  aMILoride (MIDAMOR) 5 MG tablet  Take 5 mg by mouth daily      ???  furosemide (LASIX) 40 MG tablet  Take 40 mg by mouth daily Take 1/2 tab qd      ???  traMADol (ULTRAM) 50 MG tablet  Take 1 tablet by mouth every 12 hours as needed for Pain  60 tablet  2    ???  lactulose 20 GM/30ML SOLN  Take  30 mLs by mouth 2 times daily  5400 mL  1    ???  nadolol (CORGARD) 20 MG tablet  TAKE 1 TABLET DAILY  90 tablet  1    ???  Cholecalciferol (VITAMIN D3) 1000 UNITS CAPS  Take by mouth.      ???  MILK THISTLE PO  Take by mouth.      ???  rifaximin (XIFAXAN) 550 MG tablet  Take 550 mg by mouth 2 times daily.      ???  Multiple Vitamins-Minerals (CENTRUM SILVER PO)  Take by mouth daily.      No current facility-administered medications for this visit.          Past Medical History    Past Medical History    Diagnosis  Date    ???  Hypertension     ???  Kidney stones     ???  Alpha 1 antitrypsin deficiency     ???  Thrombocytopenia ([place])  04/30/2009    ???  BPH (benign prostatic hyperplasia)     ???  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  Av fistula repair   1970's    ???  Colonoscopy   4/12      5y          Family History    Family History    Problem  Relation  Age of Onset    ???  Diabetes  Mother     ???  Diabetes  Sister     ???  Diabetes  Brother           Social History    History    Social History    ???  Marital Status:  Married      Spouse Name:  N/A      Number of Children:  N/A    ???  Years of Education:  N/A    Occupational History    ???  Not on file.    Social History Main Topics    ???  Smoking status:  Never Smoker    ???  Smokeless tobacco:  Never Used       Comment: Passive smoke exposure: yes    ???  Alcohol Use:  No    ???  Drug Use:  No    ???  Sexual Activity:  Not on file    Other Topics  Concern    ???  Not on file    Social History Narrative           Review of Systems:   ?? Constitutional: there has been no unanticipated weight loss. There's been no change in energy level, sleep pattern, or activity level.  ?? Eyes: No visual changes or diplopia. No scleral icterus.  ?? ENT: No Headaches, hearing loss or vertigo. No mouth sores or sore throat.  ?? Cardiovascular: Reviewed in HPI  ?? Respiratory: No cough or wheezing, no sputum production. No  hematemesis.  ?? Gastrointestinal: No abdominal pain, appetite loss, blood in stools. No change in bowel or bladder habits.  ?? Genitourinary: No dysuria, trouble voiding, or hematuria.  ?? Musculoskeletal: No gait disturbance, weakness or joint complaints.  ?? Integumentary: No rash or pruritis.  ?? Neurological: No headache, diplopia, change in muscle strength, numbness or tingling. No change in gait, balance, coordination, mood, affect, memory, mentation, behavior.  ?? Psychiatric: No anxiety, no depression.  ?? Endocrine: No malaise, fatigue or temperature intolerance. No excessive thirst, fluid intake, or urination. No tremor.  ?? Hematologic/Lymphatic: No abnormal bruising or bleeding, blood clots or swollen lymph nodes.  ?? Allergic/Immunologic: No nasal congestion or hives.    Physical Examination:    Vitals    Filed Vitals:     09/25/13 0913    BP:  124/72    Pulse:  71    Weight:  200 lb (90.719 kg)    SpO2:  96%         Body mass index is 33.28 kg/(m^2).   Wt Readings from Last 3 Encounters:    09/25/13  200 lb (90.719 kg)    08/13/13  196 lb (88.905 kg)    02/07/13  209 lb (94.802 kg)      BP Readings from Last 3 Encounters:    09/25/13  124/72    08/13/13  124/80    02/07/13  120/76  Constitutional and General Appearance:   WD/WN in NAD  HEENT: NC/AT  Respiratory:  ?? Normal excursion and expansion without use of accessory muscles  ?? Resp Auscultation: Normal breath sounds without dullness  Cardiovascular:  ?? The apical impulses not displaced  ?? Heart tones are crisp and normal  ?? Cervical veins are not engorged  ?? The carotid upstroke is normal in amplitude and contour without delay or bruit  ?? JVP less than 8 cm H2O  ?? RRR with nl S1 and S2 without m,r,g  ?? Peripheral pulses are symmetrical and full  ?? There is no clubbing, cyanosis of the extremities.  ?? No edema  ?? Femoral Arteries: 2+ and equal  ?? Pedal Pulses: 2+ and equal  Abdomen:  ?? No masses or tenderness  ?? Liver/Spleen: No Abnormalities  Noted  Neurological/Psychiatric:  ?? Alert and oriented in all spheres  ?? Moves all extremities well  ?? Exhibits normal gait balance and coordination  ?? No abnormalities of mood, affect, memory, mentation, or behavior are noted    Echo 09/05/2013--  Dilated left ventricle. Normal wall thickness. Decreased left ventricular systolic function with an estimated EF 30-35%.Inferior wall hypokinesis E/e=25. Mild to moderate mitral regurgitation. The left atrium is dilated. Trace of aortic insufficiency. Mild tricuspid regurgitation with RVSP estimated at 36 mmHg.The aorta is within normal limits.    Assessment:   Systolic CHF, chronic --leg swelling is up. Has been taking furosemide. Echo 09/05/2013 shows EF 30-35%, mild-mod MR, RVSP 36 mmHg.    Plan:  RHC and LHC next Wednesday  Follow up on August 11,2015 with DD or CR CNP.    I appreciate the opportunity of cooperating in the care of this patient.    Marissa Nestle, M.D., [facility]    Patient seen, chart reviewed.  Right and left heart cath today.    Hilary Hertz.D.

## 2013-10-03 NOTE — Brief Op Note (Signed)
Patient:  Andrew [name]   DOB:   September 08, 1940    A pre-sedation re-evaluation was performed immediately prior to beginning of  the procedure.  Procedure: Right and left heart cath  Medications: Procedural sedation with minimal conscious sedation  Complications: None  Estimated Blood Loss: Minimal  Specimens: Were not obtained    Cardiac Cath 10/03/13:  Anatomy:   LM-normal   LAD-50% mid with FFR of 0.79  Cx-normal, dominant  OM1- normal  RCA-normal  LVEF- 30%      Hemodynamics:  RA- mean 12  RV- 37  PAWP - mean 19   PASP- 40 mmHg    C.O. 5.6      Impression:  1.  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  2.  Moderate to severe left ventricular systolic dysfunction with estimated ejection fraction of 30%.      Plan:  1.  Medical management

## 2013-10-03 NOTE — Plan of Care (Signed)
H&P Update    I have reviewed the history and physical and examined the patient and find no relevant changes.   I have reviewed with the patient and/or family the risks, benefits, and alternatives to the procedure.    Pre-sedation Assessment    Patient:  Andrew [name]   DOB:   Jul 17, 1940  Intended Procedure: Right and left heart catheterization      Margaretmary Bayley nurses notes reviewed and agreed.  Medications reviewed  Allergies: No Known Allergies      Pre-Procedure Assessment/Plan:  ASA 2 - Patient with mild systemic disease with no functional limitations    Level of Sedation Plan:Moderate sedation    Post Procedure plan: Return to same level of care

## 2013-10-03 NOTE — Other (Addendum)
Patient Acct Nbr:  [id number]  Primary AUTH/CERT:    [place] Name:   [facility] HMO/GE/NEWHEALTH/PREFE/POS  Primary Insurance Group Number:  [id number]  Primary Insurance Plan Type: [employer] Insurance Policy Number:  [id number]

## 2013-10-05 NOTE — Telephone Encounter (Signed)
Called and spoke with patient's wife.  Explained to her the lisinopril would be unlikely to be the cause of his leg swelling.  At the time of this He did have mild CHF.  Recommended increasing Lasix to 40 mg by mouth daily on an ongoing basis.  Over the weekend, recommend 40 mg in the morning and 20 mg Lasix in the early afternoon for the next 2 days.  She is planning to drive to [facility].  I did tell her that ideally it would be better for him to stay until things are well compensated but states she used to go then have a low threshold if he develops shortness of breath to go to the closest emergency room.  She voices understanding.  TED hose were recommended.  She states that he has no shortness of breath and that there is some dependency to the leg swelling.

## 2013-10-05 NOTE — Telephone Encounter (Signed)
Pt's wife called in, she has noticed that his legs have been showing increased swelling. Isn't going down since his angiogram from two days ago. She wondered if it could be due to the Lisinopril or what should they do. Please call with recommendations

## 2013-10-18 LAB — POCT URINALYSIS DIPSTICK
Bilirubin, UA: NEGATIVE
Blood, UA POC: NEGATIVE
Glucose, UA POC: NEGATIVE
Ketones, UA: NEGATIVE
Leukocytes, UA: NEGATIVE
Nitrite, UA: NEGATIVE
Protein, UA POC: NEGATIVE
Spec Grav, UA: 1.01
Urobilinogen, UA: 0.2
pH, UA: 5

## 2013-10-18 LAB — BASIC METABOLIC PANEL
Anion Gap: 8 (ref 3–16)
BUN: 16 mg/dL (ref 7–20)
CO2: 29 mmol/L (ref 21–32)
Calcium: 8.8 mg/dL (ref 8.3–10.6)
Chloride: 103 mmol/L (ref 99–110)
Creatinine: 0.7 mg/dL — ABNORMAL LOW (ref 0.8–1.3)
GFR African American: >60 (ref >60)
GFR Non-African American: >60 (ref >60)
Glucose: 88 mg/dL (ref 70–99)
Potassium: 3.9 mmol/L (ref 3.5–5.1)
Sodium: 140 mmol/L (ref 136–145)

## 2013-10-18 NOTE — Progress Notes (Signed)
[facility]  Internal Medicine  Patient Encounter  Andrew [name], D.O., [facility]        Chief Complaint   Patient presents with   ??? Results     to discuss cardiology report       HPI: 73 y.o. male seen today to review testing results.  He was last seen 08/13/2013 and was found to have 2-3+ LE edema and he reported SOB.  Echocardiogram was obtained--- Summary  Dilated left ventricle.  Normal wall thickness.  Decreased left ventricular systolic function with an estimated EF  30-35%.Inferior wall hypokinesis  E/e=25.  Mild to moderate mitral regurgitation.  The left atrium is dilated.  Trace of aortic insufficiency. Mild tricuspid regurgitation with RVSP  estimated at 36 mmHg.  The aorta is within normal limits.    He was referred to cardiology.  He saw Dr. Jacqualyn Posey.  He underwent Cardiac Cath which showed non-obstructive CAD in the LAD.  EF was 30%.  He will F/U with hthe CNP 10/29/2013.  He was placed on Lisinopril 5 mg daily.  He is already on Corgard, Lasix. The Lasix is now at 40 mg daily (from 20 mg).   He is also on Amiloride 5 mg-- one daily (down from two daily).    He ran out for a short time.  He is back on the Lisinopril.  Swelling is much better.  He has lost 8 pounds.      He sees Dr. Einar Grad for Alpha-1 antitrypsin def and Cirrhosis.      Past Medical History   Diagnosis Date   ??? Hypertension    ??? Kidney stones    ??? Alpha 1 antitrypsin deficiency    ??? Thrombocytopenia ([place]) 04/30/2009   ??? BPH (benign prostatic hyperplasia)    ??? 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 Chronic systolic CHF (congestive heart failure) ([place]) 10/18/2013   ??? CAD (coronary artery disease)      Non-obstructive       Review of Systems - As per HPI  GI:  + Right side, flank pain.    MS:  Back pain better with brace      OBJECTIVE:  BP 110/70 mmHg   Pulse 72   Resp 12   Wt 192 lb (87.091 kg)   Wt Readings from Last 3 Encounters:   10/18/13 192 lb (87.091 kg)   10/03/13 192 lb (87.091 kg)   09/25/13 200 lb (90.719 kg)        GEN: NAD, A&O, Non-toxic  NECK: Supple.  No thyromegaly.  No increased JVP  LYMPH: No C/SC nodes.  CV: S1 S2 NL, RRR.    PULM: CTA  EXT: Much less edema, trace edema today  GI:  No CVA tenderness    ASSESSMENT::  Encounter Diagnoses   Name Primary?   ??? Chronic systolic CHF (congestive heart failure) (HCC) Yes   ??? Cirrhosis of liver without ascites, unspecified hepatic cirrhosis type ([place])    ??? Right flank pain        Plan:  1. Stay on Lisinopril, Nadolol, Lasix, Amiloride  2. ? Cardiac Rehab  3. BMP, UA    Discussed medications with patient who voiced understanding of their use, indication and potential side effects.  Pt also understands the above recommendations.   All questions answered.

## 2013-10-18 NOTE — Addendum Note (Signed)
Addended by: Gwinda Maine A on: 10/18/2013 10:24 AM     Modules accepted: Orders

## 2013-10-29 MED ORDER — LISINOPRIL 5 MG PO TABS
5 MG | ORAL_TABLET | Freq: Every evening | ORAL | Status: DC
Start: 2013-10-29 — End: 2013-11-06

## 2013-10-29 NOTE — Patient Instructions (Addendum)
1. Continue lasix 40 mg daily  2. Increase lisinopril to 10 mg nightly  3. Check blood work in 2 weeks (BMP, BNP)  4. Track fluid intake, no more than 64 oz in a day  5. Watch sodium in food, you may have 3000 mg a day  6. Weight daily, call if you gain 3 lbs in a day or 5 lbs in a week  7. CHF group visits starting Sept 1 at 2:00

## 2013-10-29 NOTE — Progress Notes (Signed)
[place]   Cardiac Evaluation    Primary Care Doctor:  Alecia Lemming, DO    Chief Complaint   Patient presents with   ??? Follow-up     angiogram, no cardiac complaints at this time   ??? Congestive Heart Failure   ??? Edema        History of Present Illness:   I had the pleasure of seeing Andrew [name] in follow up for sCHF, non-obstructive CAD, HTN, and cirrhosis. At last visit lisinopril 5 mg HS was started. He has undergone R/LHC which revealed non-obstructive CAD of the LAD, EF 30%. Lasix was increased to 40/20 x 2 days for increased edema. Today he reports improvement in edema with increase diuretics. His energy level is improved. He was able to walk in from the parking lot without stopping. He is sleeping in bed with one pillow. For the last 2 years he has slept in the recliner due to orthopnea.      [name] describes symptoms including dyspnea with exertion and fatigue, improving, but denies chest pain, orthopnea, PND, edema.     Recent Hospitalization or Testing:   Cardiac Cath 10/03/13:  Anatomy:   LM-normal   LAD-50% mid with FFR of 0.79  Cx-normal, dominant  OM1- normal  RCA-normal  LVEF- 30%    Hemodynamics:  RA- mean 12  RV- 37  PAWP - mean 19    PASP- 40 mmHg    C.O. 5.6    Impression:  1.  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  2.  Moderate to severe left ventricular systolic dysfunction with estimated ejection fraction of 30%.    Echo 09/05/2013--  Dilated left ventricle. Normal wall thickness. Decreased left ventricular systolic function with an estimated EF 30-35%.Inferior wall hypokinesis E/e=25. Mild to moderate mitral regurgitation. The left atrium is dilated. Trace of aortic insufficiency. Mild tricuspid regurgitation with RVSP estimated at 36 mmHg.The aorta is within normal limits.      NYHA:  II    Past Medical History:   has a past medical history of Hypertension; Kidney stones; Alpha 1 antitrypsin deficiency; Thrombocytopenia ([place]); BPH (benign prostatic  hyperplasia); Cirrhosis, nonalcoholic ([place]); Chronic systolic CHF (congestive heart failure) ([place]); and CAD (coronary artery disease).  Surgical History:   has past surgical history that includes AV fistula repair (1970's); Colonoscopy (4/12); and Cardiac catheterization.   Social History:   reports that he has never smoked. He has never used smokeless tobacco. He reports that he does not drink alcohol or use illicit drugs.   Family History:   Family History   Problem Relation Age of Onset   ??? Diabetes Mother    ??? Diabetes Sister    ??? Diabetes Brother        Home Medications:  Prior to Admission medications    Medication Sig Start Date End Date Taking? Authorizing Provider   furosemide (LASIX) 40 MG tablet Take 1 tablet by mouth daily 10/18/13  Yes Scott A Kotzin, DO   lisinopril (PRINIVIL;ZESTRIL) 5 MG tablet Take 1 tablet by mouth nightly 09/25/13  Yes Connye Burkitt, MD   aMILoride (MIDAMOR) 5 MG tablet Take 5 mg by mouth daily   Yes Historical Provider, MD   traMADol (ULTRAM) 50 MG tablet Take 1 tablet by mouth every 12 hours as needed for Pain 08/13/13  Yes Scott A Kotzin, DO   lactulose 20 GM/30ML SOLN Take 30 mLs by mouth 2 times daily 08/13/13  Yes [place], DO  nadolol (CORGARD) 20 MG tablet TAKE 1 TABLET DAILY 03/23/13  Yes Rolland Porter, MD   Cholecalciferol (VITAMIN D3) 1000 UNITS CAPS Take  by mouth.   Yes Historical Provider, MD   MILK THISTLE PO Take  by mouth.   Yes Historical Provider, MD   rifaximin (XIFAXAN) 550 MG tablet Take 550 mg by mouth 2 times daily.     Yes Historical Provider, MD   Multiple Vitamins-Minerals (CENTRUM SILVER PO) Take  by mouth daily. 08/23/08  Yes Historical Provider, MD        Allergies:  Review of patient's allergies indicates no known allergies.     Review of Systems:   ?? Constitutional: there has been no unanticipated weight loss. There's been no change in energy level, sleep pattern, or activity level.     ?? Eyes: No visual changes or diplopia. No scleral  icterus.  ?? ENT: No Headaches, hearing loss or vertigo. No mouth sores or sore throat.  ?? Cardiovascular: Reviewed in HPI  ?? Respiratory: No cough or wheezing, no sputum production. No hematemesis.    ?? Gastrointestinal: No abdominal pain, appetite loss, blood in stools. No change in bowel or bladder habits.  ?? Genitourinary: No dysuria, trouble voiding, or hematuria.  ?? Musculoskeletal:  No gait disturbance, weakness or joint complaints.  ?? Integumentary: No rash or pruritis.  ?? Neurological: No headache, diplopia, change in muscle strength, numbness or tingling. No change in gait, balance, coordination, mood, affect, memory, mentation, behavior.  ?? Psychiatric: No anxiety, no depression.  ?? Endocrine: No malaise, fatigue or temperature intolerance. No excessive thirst, fluid intake, or urination. No tremor.  ?? Hematologic/Lymphatic: No abnormal bruising or bleeding, blood clots or swollen lymph nodes.  ?? Allergic/Immunologic: No nasal congestion or hives.    Physical Examination:    Filed Vitals:    10/29/13 0921   BP: 114/80   Pulse: 57   Height: 5\' 5"  (1.651 m)   Weight: 191 lb 1.6 oz (86.682 kg)   SpO2: 95%        Constitutional and General Appearance: Warm and dry, no apparent distress, normal coloration  HEENT:  Normocephalic, atraumatic  Respiratory:  ?? Normal excursion and expansion without use of accessory muscles  ?? Resp Auscultation: Normal breath sounds without dullness  Cardiovascular:  ?? The apical impulses not displaced  ?? Heart tones are crisp and normal  ?? JVP less than 8 cm H2O  ?? Regular rate and rhythm, S1, S2  ?? Peripheral pulses are symmetrical and full  ?? There is no clubbing, cyanosis of the extremities.  ?? No edema  ?? Pedal Pulses: 2+ and equal   Abdomen:  ?? No masses or tenderness  ?? Liver/Spleen: No Abnormalities Noted  Neurological/Psychiatric:  ?? Alert and oriented in all spheres  ?? Moves all extremities well  ?? Exhibits normal gait balance and coordination  ?? No abnormalities of mood,  affect, memory, mentation, or behavior are noted    Lab Data:  CBC:   Lab Results   Component Value Date    WBC 4.8 10/03/2013    WBC 4.7 12/29/2012    WBC 4.6 05/02/2012    RBC 3.54 12/29/2012    RBC 3.31 05/02/2012    RBC 3.26 12/02/2011    HGB 13.1 12/29/2012    HGB 12.0 05/02/2012    HGB 12.1 12/02/2011    HCT 38.3 12/29/2012    HCT 35.7 05/02/2012    HCT 36.5 12/02/2011    MCV 108.2 12/29/2012  MCV 108.0 05/02/2012    MCV 111.9 12/02/2011    RDW 17.3 12/29/2012    RDW 17.0 05/02/2012    RDW 16.7 12/02/2011    PLT 124 10/03/2013    PLT 117 12/29/2012    PLT 133 05/02/2012     BMP:   Lab Results   Component Value Date    NA 140 10/18/2013    NA 140 08/15/2013    NA 139 12/29/2012    K 3.9 10/18/2013    K 4.6 08/15/2013    K 4.1 12/29/2012    CL 103 10/18/2013    CL 105 08/15/2013    CL 105 12/29/2012    CO2 29 10/18/2013    CO2 25 08/15/2013    CO2 28 12/29/2012    PHOS 2.9 04/06/2010    PHOS 3.3 10/30/2009    PHOS 2.9 10/09/2009    BUN 16 10/18/2013    BUN 13 08/15/2013    BUN 16 12/29/2012    CREATININE 0.7 10/18/2013    CREATININE 0.6 08/15/2013    CREATININE 1.0 12/29/2012     BNP:   No results found for this basename: BNP     PT/INR:   Lab Results   Component Value Date    PROTIME 12.7 11/19/2011    PROTIME 11.8 05/14/2011    PROTIME 11.5 02/11/2011    INR 1.14 11/19/2011    INR 1.04 05/14/2011    INR 1.01 02/11/2011    INR 1.17 05/14/2010    INR 1.18 02/13/2010    INR 1.15 10/17/2009     FASTING LIPID PANEL:  Lab Results   Component Value Date    CHOL 172 12/29/2009    HDL 59 12/29/2009    TRIG 145 12/29/2009       Assessment:    1. Chronic systolic CHF (congestive heart failure) ([place])    2. Essential hypertension    3. Coronary artery disease due to lipid rich plaque    4. Cirrhosis of liver without ascites, unspecified hepatic cirrhosis type ([place])          Plan:   1. Continue lasix 40 mg daily  2. Increase lisinopril to 10 mg nightly  3. Check blood work in 2 weeks (BMP, BNP)  4. Track fluid intake, no more than 64 oz in a day  5. Watch sodium  in food, you may have 3000 mg a day  6. Weight daily, call if you gain 3 lbs in a day or 5 lbs in a week  7. CHF group starting Sept 1    I appreciate the opportunity of cooperating in the care of this individual.    Bernadette Hoit, CNP, 10/29/2013, 9:38 AM

## 2013-11-06 MED ORDER — LISINOPRIL 5 MG PO TABS
5 MG | ORAL_TABLET | ORAL | Status: DC
Start: 2013-11-06 — End: 2013-11-20

## 2013-11-06 MED ORDER — FUROSEMIDE 40 MG PO TABS
40 MG | ORAL_TABLET | Freq: Every day | ORAL | Status: DC
Start: 2013-11-06 — End: 2014-04-03

## 2013-11-06 NOTE — Progress Notes (Deleted)
Future Appointments Provider Department Dept Phone      11/20/2013 2:00 PM Karrie Meres, NP [facility] [place] [phone]     12/04/2013 2:00 PM Karrie Meres, NP [facility] [place] [phone]     12/18/2013 2:00 PM Karrie Meres, NP [facility] [place] [phone]     01/14/2014 8:30 AM Alecia Lemming, [facility] [phone]             Vital Signs     Blood Pressure Pulse Weight Oxygen Saturation Smoking Status        112/70 mmHg 59 188 lb (85.276 kg) 97% Never Smoker            Instructions            1. Increase the lisinopril from 10 mg nightly to 5 mg in AM and 10 mg in PM  2. Decrease the furosemide (Lasix) to 20 mg daily  3. No change in the Corgard or amiloride  4. Have blood work in 1-2 weeks (BMP)  5. Follow up in 2 weeks for next CHF group visit             Your Current Medications Are               furosemide (LASIX) 40 MG tablet Take 0.5 tablets by mouth daily     lisinopril (PRINIVIL;ZESTRIL) 5 MG tablet 5 mg in AM and 10 mg in PM     aMILoride (MIDAMOR) 5 MG tablet Take 5 mg by mouth daily     traMADol (ULTRAM) 50 MG tablet Take 1 tablet by mouth every 12 hours as needed for Pain     lactulose 20 GM/30ML SOLN Take 30 mLs by mouth 2 times daily     nadolol (CORGARD) 20 MG tablet TAKE 1 TABLET DAILY     Cholecalciferol (VITAMIN D3) 1000 UNITS CAPS Take by mouth daily      MILK THISTLE PO Take  by mouth.     rifaximin (XIFAXAN) 550 MG tablet Take 550 mg by mouth 2 times daily.       Multiple Vitamins-Minerals (CENTRUM SILVER PO) Take  by mouth daily.

## 2013-11-06 NOTE — Progress Notes (Signed)
[facility]  Shared Medical Appointment  CHF Clinic    [place], 04/11/1940    Primary Care Doctor:  Alecia Lemming, DO    11/06/13  Filed Vitals:    11/06/13 1354   BP: 112/70   Pulse: 59   Weight: 188 lb (85.276 kg)   SpO2: 97%        Wt Readings from Last 3 Encounters:   11/06/13 188 lb (85.276 kg)   10/29/13 191 lb 1.6 oz (86.682 kg)   10/18/13 192 lb (87.091 kg)     BP Readings from Last 3 Encounters:   11/06/13 112/70   10/29/13 114/80   10/18/13 110/70       History of Present Illness:   Andrew [name] is seen in CHF group sessions for his 1 st session.  He is followed for non-ischemic cardiomyopathy, CHF, non-obstructive coronary artery disease, HTN, liver cirrhosis, and A1At.  He was seen in the office 2 weeks ago and lisinopril was increased to 10 mg nightly.  He reports he is limited mostly by his back pain but not by any heart disease.  He is also limited due to medications for liver disease.  He follows with Dr. Einar Grad of [place].  He complains of leg muscle cramping.  After undergoing cardiac cath he had increased leg swelling that resolved with an increase in Lasix from 20 mg to 40/20 mg for 3 days then 40 mg daily thereafter.  He remains on amiloride as well.  He sleeps in a chair due to back pain.      Andrew [name] describes symptoms including fatigue, edema but denies chest pain, dyspnea, palpitations, orthopnea, PND, early saiety, syncope.     NYHA class:  II    Assessment:    1. Essential hypertension    2. Chronic systolic CHF (congestive heart failure) ([place])    3. Non-ischemic cardiomyopathy ([place])    4. Cirrhosis, nonalcoholic ([place])    5. Alpha-1-antitrypsin deficiency    6. Coronary artery disease due to lipid rich plaque          Andrew [name] participated in a shared medical appointment for CHF disease management including a 30 minute educational session on CHF diagnosis, causes, testing, treatment, symptoms and signs of heart failure, who and when to call.  Evaluation and  treatment changes were made as described above by nurse practitioner then given further instruction/ consultation with pharmacist.     Plan:   1. Increase the lisinopril from 10 mg nightly to 5 mg in AM and 10 mg in PM  2. Decrease the furosemide (Lasix) to 20 mg daily  3. No change in the Corgard or amiloride (will discuss with Dr. Jacqualyn Posey)  4. Have blood work in 1-2 weeks (BMP)  5. Follow up in 2 weeks for next CHF group visit      Daily Care at Home  1. Did I weigh myself at home?   Yes   Weight:  183 lbs  2. Do I have a sodium restriction? yes   Na limit  3000  3. Do I eat out at restaurants often? Yes   Frequency 1 X week  4. Do I have a fluid limitation? yes   Limit  64 oz  5. Do I take all my medications?  Yes  6. Am I on an exercise program?  no   Activity  ---  7. Do I smoke?    No    Living with Heart Failure  Questionnaire  1. I feel like my overall health is... poor  2. I have trouble sleeping at night.Marland KitchenMarland KitchenNever  3. I have problems breathing.Marland KitchenMarland KitchenNever   4. I have swelling in my legs/ ankles.Marland KitchenMarland KitchenSeldom  5. My appetite is .Marland KitchenMarland Kitchenexcellent  6. My energy is... poor  7. My weight varies by 3 lbs or more .Marland KitchenMarland KitchenFrequently  8. I am dizzy or lightheaded.Marland KitchenMarland KitchenFrequently      No Known Allergies  Current Outpatient Prescriptions   Medication Sig Dispense Refill   ??? lisinopril (PRINIVIL;ZESTRIL) 5 MG tablet Take 2 tablets by mouth nightly  30 tablet  11   ??? furosemide (LASIX) 40 MG tablet Take 1 tablet by mouth daily  60 tablet     ??? aMILoride (MIDAMOR) 5 MG tablet Take 5 mg by mouth daily       ??? traMADol (ULTRAM) 50 MG tablet Take 1 tablet by mouth every 12 hours as needed for Pain  60 tablet  2   ??? lactulose 20 GM/30ML SOLN Take 30 mLs by mouth 2 times daily  5400 mL  1   ??? nadolol (CORGARD) 20 MG tablet TAKE 1 TABLET DAILY  90 tablet  1   ??? Cholecalciferol (VITAMIN D3) 1000 UNITS CAPS Take by mouth daily        ??? MILK THISTLE PO Take  by mouth.       ??? rifaximin (XIFAXAN) 550 MG tablet Take 550 mg by mouth 2 times daily.          ??? Multiple Vitamins-Minerals (CENTRUM SILVER PO) Take  by mouth daily.         No current facility-administered medications for this visit.       Past Medical History   Diagnosis Date   ??? Hypertension    ??? Kidney stones    ??? Alpha 1 antitrypsin deficiency    ??? Thrombocytopenia ([place]) 04/30/2009   ??? BPH (benign prostatic hyperplasia)    ??? 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 Chronic systolic CHF (congestive heart failure) ([place]) 10/18/2013   ??? CAD (coronary artery disease)      Non-obstructive     Past Surgical History   Procedure Laterality Date   ??? Av fistula repair  1970's   ??? Colonoscopy  4/12     5y   ??? Cardiac catheterization       Family History   Problem Relation Age of Onset   ??? Diabetes Mother    ??? Diabetes Sister    ??? Diabetes Brother      History     Social History   ??? Marital Status: Married     Spouse Name: N/A     Number of Children: N/A   ??? Years of Education: N/A     Occupational History   ??? Not on file.     Social History Main Topics   ??? Smoking status: Never Smoker    ??? Smokeless tobacco: Never Used      Comment: Passive smoke exposure: yes   ??? Alcohol Use: No   ??? Drug Use: No   ??? Sexual Activity: Not on file     Other Topics Concern   ??? Not on file     Social History Narrative       Review of System:  ?? General ROS: negative for - chills, fever   ?? Psychological ROS: negative for - anxiety or depression  ?? Ophthalmic ROS: negative for - eye pain or  loss of vision  ?? ENT ROS: negative for - headaches, sore throat   ?? Allergy and Immunology ROS: negative for - hives  ?? Hematological and Lymphatic ROS: negative for - bleeding problems, blood clots, bruising or jaundice  ?? Endocrine ROS: negative for - skin changes, temperature intolerance or unexpected weight changes  ?? Respiratory ROS: negative for - cough, sputum, wheezing  ?? Cardiovascular ROS: Per HPI.   ?? Gastrointestinal ROS: negative for - abdominal pain, diarrhea, nausea/vomiting, bleeding   ?? Genito-Urinary ROS: negative for -  dysuria or incontinence  ?? Musculoskeletal ROS: negative for - joint swelling   ?? Neurological ROS: negative for - confusion, numbness/tingling, seizures, weakness  ?? Dermatological ROS: negative for - rash    Physical Examination:    Filed Vitals:    11/06/13 1354   BP: 112/70   Pulse: 59   Weight: 188 lb (85.276 kg)   SpO2: 97%        Constitutional and General Appearance: Warm and dry, no apparent distress, normal coloration  HEENT:  Normocephalic, atraumatic  Respiratory:  ?? Normal excursion and expansion without use of accessory muscles  ?? Resp Auscultation: Normal breath sounds without dullness  Cardiovascular:  ?? The apical impulses not displaced  ?? Heart tones are crisp and normal  ?? JVP less than 8 cm H2O  ?? Regular rate and rhythm, normal S1S2, no m/g/r/c  ?? Peripheral pulses are symmetrical and full  ?? There is no clubbing, cyanosis of the extremities.  ?? Trace edema  ?? Pedal Pulses: 2+ and equal   Abdomen:  ?? No masses or tenderness  ?? Liver/Spleen: No Abnormalities Noted  Neurological/Psychiatric:  ?? Alert and oriented in all spheres  ?? Moves all extremities well  ?? Exhibits normal gait balance and coordination  ?? No abnormalities of mood, affect, memory, mentation, or behavior are noted    Cardiac Cath 10/03/13:  Anatomy:   LM-normal   LAD-50% mid with FFR of 0.79  Cx-normal, dominant  OM1- normal  RCA-normal  LVEF- 30%    Hemodynamics:  RA- mean 12  RV- 37  PAWP - mean 19    PASP- 40 mmHg  C.O. 5.6    Echo 09/05/2013--  Dilated left ventricle. Normal wall thickness. Decreased left ventricular systolic function with an estimated EF 30-35%.Inferior wall hypokinesis E/e=25. Mild to moderate mitral regurgitation. The left atrium is dilated. Trace of aortic insufficiency. Mild tricuspid regurgitation with RVSP estimated at 36 mmHg.The aorta is within normal limits.    BMP:   Lab Results   Component Value Date    NA 140 10/18/2013    NA 140 08/15/2013    NA 139 12/29/2012    K 3.9 10/18/2013    K 4.6 08/15/2013     K 4.1 12/29/2012    CL 103 10/18/2013    CL 105 08/15/2013    CL 105 12/29/2012    CO2 29 10/18/2013    CO2 25 08/15/2013    CO2 28 12/29/2012    PHOS 2.9 04/06/2010    PHOS 3.3 10/30/2009    PHOS 2.9 10/09/2009    BUN 16 10/18/2013    BUN 13 08/15/2013    BUN 16 12/29/2012    CREATININE 0.7 10/18/2013    CREATININE 0.6 08/15/2013    CREATININE 1.0 12/29/2012     BNP:   No results found for this basename: BNP

## 2013-11-06 NOTE — Patient Instructions (Signed)
1. Increase the lisinopril from 10 mg nightly to 5 mg in AM and 10 mg in PM  2. Decrease the furosemide (Lasix) to 20 mg daily  3. No change in the Corgard or amiloride  4. Have blood work in 1-2 weeks (BMP)  5. Follow up in 2 weeks for next CHF group visit

## 2013-11-16 LAB — BASIC METABOLIC PANEL
Anion Gap: 8 (ref 3–16)
BUN: 14 mg/dL (ref 7–20)
CO2: 26 mmol/L (ref 21–32)
Calcium: 8.2 mg/dL — ABNORMAL LOW (ref 8.3–10.6)
Chloride: 106 mmol/L (ref 99–110)
Creatinine: 0.7 mg/dL — ABNORMAL LOW (ref 0.8–1.3)
GFR African American: >60 (ref >60)
GFR Non-African American: >60 (ref >60)
Glucose: 121 mg/dL — ABNORMAL HIGH (ref 70–99)
Potassium: 4.9 mmol/L (ref 3.5–5.1)
Sodium: 140 mmol/L (ref 136–145)

## 2013-11-16 MED ORDER — TRAMADOL HCL 50 MG PO TABS
50 MG | ORAL_TABLET | Freq: Two times a day (BID) | ORAL | Status: DC | PRN
Start: 2013-11-16 — End: 2014-02-13

## 2013-11-16 NOTE — Telephone Encounter (Signed)
Last ov:10/18/2013  Last refill:  08/13/2013

## 2013-11-16 NOTE — Telephone Encounter (Signed)
From: [name]  To: Alecia Lemming, DO  Sent: 11/16/2013 8:22 AM EDT  Subject: Medication Renewal Request    Original authorizing provider: Elayne Snare Kotzin, DO    [name] would like a refill of the following medications:  traMADol (ULTRAM) 50 MG tablet [Scott Melany Guernsey, DO]    Preferred pharmacy: [place], [place] [phone] - F [phone]    Comment:

## 2013-11-20 MED ORDER — LISINOPRIL 5 MG PO TABS
5 MG | ORAL_TABLET | ORAL | Status: DC
Start: 2013-11-20 — End: 2014-04-03

## 2013-11-20 MED ORDER — CARVEDILOL 6.25 MG PO TABS
6.25 MG | ORAL_TABLET | Freq: Two times a day (BID) | ORAL | Status: DC
Start: 2013-11-20 — End: 2014-03-18

## 2013-11-20 NOTE — Patient Instructions (Signed)
1. Stop nadolol   2. Start Coreg (carvedelol) 6.25 mg twice a day  3. No change in other medications  4. Follow up in 2 weeks at CHF group

## 2013-11-20 NOTE — Progress Notes (Signed)
[facility]  Shared Medical Appointment  CHF Clinic    [place], 07-31-1940    Primary Care Doctor:  Alecia Lemming, DO    11/20/13  Filed Vitals:    11/20/13 1400   BP: 113/63   Pulse: 57   Weight: 187 lb (84.823 kg)   SpO2: 96%        Wt Readings from Last 3 Encounters:   11/20/13 187 lb (84.823 kg)   11/06/13 188 lb (85.276 kg)   10/29/13 191 lb 1.6 oz (86.682 kg)     BP Readings from Last 3 Encounters:   11/20/13 113/63   11/06/13 112/70   10/29/13 114/80       History of Present Illness:   Andrew [name] is seen in CHF shared medical appointments for his 2 nd session.  He is followed for sCHF, CMP, non-obstructive coronary artery disease, HTN, as well as cirrhosis and chronic back pain.  At last visit we increased the lisinopril to 5 mg AM and 10 mg PM. Repeat labs show renal function and potassium are stable. His weight fluctuates from 184-189 lbs without symptoms. He denies shortness of breath. Limited by back pain before breathing. His energy level is fair.      Andrew [name] describes symptoms including fatigue, edema but denies dyspnea, orthopnea, PND.     NYHA class:  II    Assessment:    1. Chronic systolic CHF (congestive heart failure) ([place])    2. Non-ischemic cardiomyopathy ([place])    3. Essential hypertension    4. Cirrhosis of liver without ascites, unspecified hepatic cirrhosis type ([place])    5. Mild CAD          Andrew [name] participated in a shared medical appointment for CHF disease management including a 30 minute educational session on no added salt diet, avoidance of salt substitutes, and fluid allowances.  Evaluation and treatment changes were made as described above by nurse practitioner then given further instruction/ consultation with pharmacist.     Plan:   1. Stop nadolol   2. Start Coreg (carvedelol) 6.25 mg twice a day  3. No change in other medications  4. Follow up in 2 weeks at CHF group       Daily Care at Home  1. Did I weigh myself at home?   Yes   Weight:  184  lbs  2. Do I have a sodium restriction? yes   Na limit  3000  3. Do I eat out at restaurants often? Yes   Frequency 2X week  4. Do I have a fluid limitation? unknown   Limit  ---  5. Do I take all my medications?  Yes  6. Am I on an exercise program?  no   Activity  ---  7. Do I smoke?    No    Living with Heart Failure Questionnaire  1. I feel like my overall health is... fair  2. I have trouble sleeping at night.Marland KitchenMarland KitchenNever  3. I have problems breathing.Marland KitchenMarland KitchenNever   4. I have swelling in my legs/ ankles.Marland KitchenMarland KitchenFrequently  5. My appetite is ...good  6. My energy is... fair  7. My weight varies by 3 lbs or more .Marland KitchenMarland KitchenFrequently  8. I am dizzy or lightheaded.Marland KitchenMarland KitchenSeldom      No Known Allergies  Current Outpatient Prescriptions   Medication Sig Dispense Refill   ??? traMADol (ULTRAM) 50 MG tablet Take 1 tablet by mouth every 12 hours as needed for Pain  60 tablet  2   ??? furosemide (LASIX) 40 MG tablet Take 0.5 tablets by mouth daily  30 tablet  3   ??? lisinopril (PRINIVIL;ZESTRIL) 5 MG tablet 5 mg in AM and 10 mg in PM  90 tablet  3   ??? aMILoride (MIDAMOR) 5 MG tablet Take 5 mg by mouth daily       ??? lactulose 20 GM/30ML SOLN Take 30 mLs by mouth 2 times daily  5400 mL  1   ??? nadolol (CORGARD) 20 MG tablet TAKE 1 TABLET DAILY  90 tablet  1   ??? Cholecalciferol (VITAMIN D3) 1000 UNITS CAPS Take by mouth daily        ??? MILK THISTLE PO Take  by mouth.       ??? rifaximin (XIFAXAN) 550 MG tablet Take 550 mg by mouth 2 times daily.         ??? Multiple Vitamins-Minerals (CENTRUM SILVER PO) Take  by mouth daily.         No current facility-administered medications for this visit.       Past Medical History   Diagnosis Date   ??? Hypertension    ??? Kidney stones    ??? Alpha 1 antitrypsin deficiency    ??? Thrombocytopenia ([place]) 04/30/2009   ??? BPH (benign prostatic hyperplasia)    ??? 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 Chronic systolic CHF (congestive heart failure) ([employer]) 10/18/2013   ??? CAD (coronary artery disease)      Non-obstructive     Past  Surgical History   Procedure Laterality Date   ??? Av fistula repair  1970's   ??? Colonoscopy  4/12     5y   ??? Cardiac catheterization       Family History   Problem Relation Age of Onset   ??? Diabetes Mother    ??? Diabetes Sister    ??? Diabetes Brother      History     Social History   ??? Marital Status: Married     Spouse Name: N/A     Number of Children: N/A   ??? Years of Education: N/A     Occupational History   ??? Not on file.     Social History Main Topics   ??? Smoking status: Never Smoker    ??? Smokeless tobacco: Never Used      Comment: Passive smoke exposure: yes   ??? Alcohol Use: No   ??? Drug Use: No   ??? Sexual Activity: Not on file     Other Topics Concern   ??? Not on file     Social History Narrative       Review of System:  ?? General ROS: negative for - chills, fever   ?? Psychological ROS: negative for - anxiety or depression  ?? Ophthalmic ROS: negative for - eye pain or loss of vision  ?? ENT ROS: negative for - headaches, sore throat   ?? Allergy and Immunology ROS: negative for - hives  ?? Hematological and Lymphatic ROS: negative for - bleeding problems, blood clots, bruising or jaundice  ?? Endocrine ROS: negative for - skin changes, temperature intolerance or unexpected weight changes  ?? Respiratory ROS: negative for - cough, sputum, wheezing  ?? Cardiovascular ROS: Per HPI.   ?? Gastrointestinal ROS: negative for - abdominal pain, diarrhea, nausea/vomiting, bleeding   ?? Genito-Urinary ROS: negative for - dysuria or incontinence  ?? Musculoskeletal ROS: negative for - joint swelling   ??  Neurological ROS: negative for - confusion, numbness/tingling, seizures, weakness  ?? Dermatological ROS: negative for - rash    Physical Examination:    Filed Vitals:    11/20/13 1400   BP: 113/63   Pulse: 57   Weight: 187 lb (84.823 kg)   SpO2: 96%        Constitutional and General Appearance: Warm and dry, no apparent distress, normal coloration  HEENT:  Normocephalic, atraumatic  Respiratory:  ?? Normal excursion and expansion  without use of accessory muscles  ?? Resp Auscultation: Normal breath sounds without dullness  Cardiovascular:  ?? The apical impulses not displaced  ?? Heart tones are crisp and normal  ?? JVP less than 8 cm H2O  ?? Regular rate and rhythm, S1, S2  ?? Peripheral pulses are symmetrical and full  ?? There is no clubbing, cyanosis of the extremities.  ?? Trace edema  ?? Pedal Pulses: 2+ and equal   Abdomen:  ?? No masses or tenderness  ?? Liver/Spleen: No Abnormalities Noted  Neurological/Psychiatric:  ?? Alert and oriented in all spheres  ?? Moves all extremities well  ?? Exhibits normal gait balance and coordination  ?? No abnormalities of mood, affect, memory, mentation, or behavior are noted    Cardiac Cath 10/03/13:  Anatomy:   LM-normal   LAD-50% mid with FFR of 0.79  Cx-normal, dominant  OM1- normal  RCA-normal  LVEF- 30%    Hemodynamics:  RA- mean 12  RV- 37  PAWP - mean 19    PASP- 40 mmHg  C.O. 5.6      Echo 09/05/2013--  Dilated left ventricle. Normal wall thickness. Decreased left ventricular systolic function with an estimated EF 30-35%.Inferior wall hypokinesis E/e=25. Mild to moderate mitral regurgitation. The left atrium is dilated. Trace of aortic insufficiency. Mild tricuspid regurgitation with RVSP estimated at 36 mmHg.The aorta is within normal limits.      BMP:   Lab Results   Component Value Date    NA 140 11/16/2013    NA 140 10/18/2013    NA 140 08/15/2013    K 4.9 11/16/2013    K 3.9 10/18/2013    K 4.6 08/15/2013    CL 106 11/16/2013    CL 103 10/18/2013    CL 105 08/15/2013    CO2 26 11/16/2013    CO2 29 10/18/2013    CO2 25 08/15/2013    PHOS 2.9 04/06/2010    PHOS 3.3 10/30/2009    PHOS 2.9 10/09/2009    BUN 14 11/16/2013    BUN 16 10/18/2013    BUN 13 08/15/2013    CREATININE 0.7 11/16/2013    CREATININE 0.7 10/18/2013    CREATININE 0.6 08/15/2013     BNP:   No results found for this basename: BNP

## 2013-11-23 ENCOUNTER — Encounter

## 2013-12-04 NOTE — Progress Notes (Signed)
[facility]  Shared Medical Appointment  CHF Clinic    [place], 1941/01/03    Primary Care Doctor:  Alecia Lemming, DO    12/04/13  Filed Vitals:    12/04/13 1353   BP: 92/53   Pulse: 53   Weight: 195 lb (88.451 kg)   SpO2: 97%        Wt Readings from Last 3 Encounters:   12/04/13 195 lb (88.451 kg)   11/20/13 187 lb (84.823 kg)   11/06/13 188 lb (85.276 kg)     BP Readings from Last 3 Encounters:   12/04/13 92/53   11/20/13 113/63   11/06/13 112/70       History of Present Illness:   Andrew [name] is seen in his third CHF group visit. He is followed for sCHF, CM, HTN, and cirrhosis. At last visit nadolol was changed to Coreg.  He has tolerated this well.  His weight is up 3 lbs at home.  He reports some lightheadedness with turning quickly.  He is mostly limited due to back pain causing him to move at a slower pace.  He takes the tramadol twice daily.      [name] describes symptoms including fatigue, lightheadedness, edema but denies chest pain, dyspnea, palpitations, orthopnea, PND, early saiety, syncope.     NYHA class:  II    Assessment:    1. Systolic CHF, chronic ([place])    2. Non-ischemic cardiomyopathy ([place])    3. Essential hypertension    4. Cirrhosis, nonalcoholic ([place])          [name] participated in a shared medical appointment for CHF disease management including a 30 minute educational session on activity and exercise.  Evaluation and treatment changes were made as described above by nurse practitioner then given further instruction/ consultation with pharmacist.     Plan:   1. No change in heart medicines  2. Referral to Cardiac Rehab  3. Follow up in 2 weeks for last CHF group session  4. Follow up with Dr. Jacqualyn Posey on November 11 th with an echocardiogram also  5. Consider changing ameliride to spironolactone next visit      Daily Care at Home  1. Did I weigh myself at home?   Yes   Weight:  184  2. Do I have a sodium restriction? yes   Na limit  2000  3. Do I eat out at  restaurants often? No   Frequency 1 x wk  4. Do I have a fluid limitation? yes   Limit  64 oz  5. Do I take all my medications?  Yes  6. Am I on an exercise program?  no   Activity  ---  7. Do I smoke?    No    Living with Heart Failure Questionnaire  1. I feel like my overall health is... fair  2. I have trouble sleeping at night.Marland KitchenMarland KitchenNever  3. I have problems breathing.Marland KitchenMarland KitchenNever   4. I have swelling in my legs/ ankles.Marland KitchenMarland KitchenSeldom  5. My appetite is .Marland KitchenMarland Kitchenexcellent  6. My energy is... poor  7. My weight varies by 3 lbs or more .Marland KitchenMarland KitchenNever  8. I am dizzy or lightheaded.Marland KitchenMarland KitchenNever      No Known Allergies  Current Outpatient Prescriptions   Medication Sig Dispense Refill   ??? carvedilol (COREG) 6.25 MG tablet Take 1 tablet by mouth 2 times daily (with meals) 60 tablet 3   ??? lisinopril (PRINIVIL;ZESTRIL) 5 MG tablet 5 mg in AM and  10 mg in PM 90 tablet 3   ??? traMADol (ULTRAM) 50 MG tablet Take 1 tablet by mouth every 12 hours as needed for Pain 60 tablet 2   ??? furosemide (LASIX) 40 MG tablet Take 0.5 tablets by mouth daily 30 tablet 3   ??? aMILoride (MIDAMOR) 5 MG tablet Take 5 mg by mouth daily     ??? lactulose 20 GM/30ML SOLN Take 30 mLs by mouth 2 times daily 5400 mL 1   ??? Cholecalciferol (VITAMIN D3) 1000 UNITS CAPS Take by mouth daily      ??? MILK THISTLE PO Take  by mouth.     ??? rifaximin (XIFAXAN) 550 MG tablet Take 550 mg by mouth 2 times daily.       ??? Multiple Vitamins-Minerals (CENTRUM SILVER PO) Take  by mouth daily.       No current facility-administered medications for this visit.       Past Medical History   Diagnosis Date   ??? Hypertension    ??? Kidney stones    ??? Alpha 1 antitrypsin deficiency    ??? Thrombocytopenia ([place]) 04/30/2009   ??? BPH (benign prostatic hyperplasia)    ??? 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 Chronic systolic CHF (congestive heart failure) ([place]) 10/18/2013   ??? CAD (coronary artery disease)      Non-obstructive   ??? Compression fracture of lumbar vertebra ([place])      multiple, lumbar     Past  Surgical History   Procedure Laterality Date   ??? Av fistula repair  1970's   ??? Colonoscopy  4/12     5y   ??? Cardiac catheterization       Family History   Problem Relation Age of Onset   ??? Diabetes Mother    ??? Diabetes Sister    ??? Diabetes Brother      History     Social History   ??? Marital Status: Married     Spouse Name: N/A     Number of Children: N/A   ??? Years of Education: N/A     Occupational History   ??? Not on file.     Social History Main Topics   ??? Smoking status: Never Smoker    ??? Smokeless tobacco: Never Used      Comment: Passive smoke exposure: yes   ??? Alcohol Use: No   ??? Drug Use: No   ??? Sexual Activity: Not on file     Other Topics Concern   ??? Not on file     Social History Narrative       Review of System:  ?? General ROS: negative for - chills, fever   ?? Psychological ROS: negative for - anxiety or depression  ?? Ophthalmic ROS: negative for - eye pain or loss of vision  ?? ENT ROS: negative for - headaches, sore throat   ?? Allergy and Immunology ROS: negative for - hives  ?? Hematological and Lymphatic ROS: negative for - bleeding problems, blood clots, bruising or jaundice  ?? Endocrine ROS: negative for - skin changes, temperature intolerance or unexpected weight changes  ?? Respiratory ROS: negative for - cough, sputum, wheezing  ?? Cardiovascular ROS: Per HPI.   ?? Gastrointestinal ROS: negative for - abdominal pain, diarrhea, nausea/vomiting, bleeding   ?? Genito-Urinary ROS: negative for - dysuria or incontinence  ?? Musculoskeletal ROS: negative for - joint swelling   ?? Neurological ROS: negative for -  confusion, numbness/tingling, seizures, weakness  ?? Dermatological ROS: negative for - rash    Physical Examination:    Filed Vitals:    12/04/13 1353   BP: 92/53   Pulse: 53   Weight: 195 lb (88.451 kg)   SpO2: 97%        Constitutional and General Appearance: Warm and dry, no apparent distress, normal coloration  HEENT:  Normocephalic, atraumatic  Respiratory:  ?? Normal excursion and expansion without  use of accessory muscles  ?? Resp Auscultation: Normal breath sounds without dullness  Cardiovascular:  ?? The apical impulses not displaced  ?? Heart tones are crisp and normal  ?? JVP less than 8 cm H2O  ?? Regular rate and rhythm, normal S1S2, + murmur, no g/r/c  ?? Peripheral pulses are symmetrical and full  ?? There is no clubbing, cyanosis of the extremities.  ?? 1+ edema  ?? Pedal Pulses: 2+ and equal   Abdomen:  ?? No masses or tenderness  ?? Liver/Spleen: No Abnormalities Noted  Neurological/Psychiatric:  ?? Alert and oriented in all spheres  ?? Moves all extremities well  ?? Exhibits normal gait balance and coordination  ?? No abnormalities of mood, affect, memory, mentation, or behavior are noted    Cardiac Cath 10/03/13:  Anatomy:   LM-normal   LAD-50% mid with FFR of 0.79  Cx-normal, dominant  OM1- normal  RCA-normal  LVEF- 30%    Hemodynamics:  RA- mean 12  RV- 37  PAWP - mean 19    PASP- 40 mmHg  C.O. 5.6  Impression:  1.  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  2.  Moderate to severe left ventricular systolic dysfunction with estimated ejection fraction of 30%.    Echo 09/05/2013--  Dilated left ventricle. Normal wall thickness. Decreased left ventricular systolic function with an estimated EF 30-35%.Inferior wall hypokinesis E/e=25. Mild to moderate mitral regurgitation. The left atrium is dilated. Trace of aortic insufficiency. Mild tricuspid regurgitation with RVSP estimated at 36 mmHg.The aorta is within normal limits.      BMP:   Lab Results   Component Value Date    NA 140 11/16/2013    NA 140 10/18/2013    NA 140 08/15/2013    K 4.9 11/16/2013    K 3.9 10/18/2013    K 4.6 08/15/2013    CL 106 11/16/2013    CL 103 10/18/2013    CL 105 08/15/2013    CO2 26 11/16/2013    CO2 29 10/18/2013    CO2 25 08/15/2013    PHOS 2.9 04/06/2010    PHOS 3.3 10/30/2009    PHOS 2.9 10/09/2009    BUN 14 11/16/2013    BUN 16 10/18/2013    BUN 13 08/15/2013    CREATININE 0.7 11/16/2013    CREATININE 0.7 10/18/2013     CREATININE 0.6 08/15/2013     BNP:   No results found for: BNP

## 2013-12-04 NOTE — Progress Notes (Deleted)
Your To-Do List      Future Appointments Provider Department Dept Phone     12/18/2013 2:00 PM Karrie Meres, NP [facility] Cardiology - [place] [phone]     01/14/2014 8:30 AM Alecia Lemming, [place] [facility] Primary Care [phone]     Vital Signs      Blood Pressure Pulse Weight Oxygen Saturation Smoking Status        92/53 mmHg 53 195 lb (88.451 kg) 97% Never Smoker           Instructions             1. No change in heart medicines  2. Referral to Cardiac Rehab  3. Follow up in 2 weeks for last CHF group session  4. Follow up with Dr. Jacqualyn Posey on November 11 th with an echocardiogram also         Your Current Medications Are                carvedilol (COREG) 6.25 MG tablet Take 1 tablet by mouth 2 times daily (with meals)     lisinopril (PRINIVIL;ZESTRIL) 5 MG tablet 5 mg in AM and 10 mg in PM     traMADol (ULTRAM) 50 MG tablet Take 1 tablet by mouth every 12 hours as needed for Pain     furosemide (LASIX) 40 MG tablet Take 0.5 tablets by mouth daily     aMILoride (MIDAMOR) 5 MG tablet Take 5 mg by mouth daily     lactulose 20 GM/30ML SOLN Take 30 mLs by mouth 2 times daily     Cholecalciferol (VITAMIN D3) 1000 UNITS CAPS Take by mouth daily      MILK THISTLE PO Take  by mouth.     rifaximin (XIFAXAN) 550 MG tablet Take 550 mg by mouth 2 times daily.       Multiple Vitamins-Minerals (CENTRUM SILVER PO) Take  by mouth daily.

## 2013-12-04 NOTE — Patient Instructions (Addendum)
1. No change in heart medicines  2. Referral to Cardiac Rehab  3. Follow up in 2 weeks for last CHF group session  4. Follow up with Dr. Jacqualyn Posey on November 11 th with an echocardiogram also

## 2013-12-08 ENCOUNTER — Inpatient Hospital Stay: Attending: Internal Medicine | Primary: Internal Medicine

## 2013-12-18 MED ORDER — SPIRONOLACTONE 50 MG PO TABS
50 MG | ORAL_TABLET | Freq: Every day | ORAL | Status: DC
Start: 2013-12-18 — End: 2014-03-20

## 2013-12-18 NOTE — Progress Notes (Deleted)
Future Appointments Provider Department Dept Phone     01/14/2014 8:30 AM Alecia Lemming, [place] [facility] [phone]     01/16/2014 1:30 PM ECHO ROOM 2 [facility] Echocardiography [phone]     01/16/2014 2:15 PM Connye Burkitt, MD [facility] - [place] [phone]         Vital Signs      Blood Pressure Pulse Weight Oxygen Saturation Smoking Status        114/65 mmHg 55 195 lb (88.451 kg) 98% Never Smoker           Instructions             1. Stop the Amiloride completely  2. Start instead Spironolactone 50 mg once daily  3. Have blood work when you return from trip to [place]. Follow up with Dr. Jacqualyn Posey in November as scheduled         Your Current Medications Are                spironolactone (ALDACTONE) 50 MG tablet Take 1 tablet by mouth daily     carvedilol (COREG) 6.25 MG tablet Take 1 tablet by mouth 2 times daily (with meals)     lisinopril (PRINIVIL;ZESTRIL) 5 MG tablet 5 mg in AM and 10 mg in PM     traMADol (ULTRAM) 50 MG tablet Take 1 tablet by mouth every 12 hours as needed for Pain     furosemide (LASIX) 40 MG tablet Take 0.5 tablets by mouth daily     lactulose 20 GM/30ML SOLN Take 30 mLs by mouth 2 times daily     Cholecalciferol (VITAMIN D3) 1000 UNITS CAPS Take by mouth daily      MILK THISTLE PO Take  by mouth.     rifaximin (XIFAXAN) 550 MG tablet Take 550 mg by mouth 2 times daily.       Multiple Vitamins-Minerals (CENTRUM SILVER PO) Take  by mouth daily.

## 2013-12-18 NOTE — Progress Notes (Signed)
[facility]  Shared Medical Appointment  CHF Clinic    [place], 1940-09-28    Primary Care Doctor:  Alecia Lemming, DO    12/18/13  Filed Vitals:    12/18/13 1352   BP: 114/65   Pulse: 55   Weight: 195 lb (88.451 kg)   SpO2: 98%        Wt Readings from Last 3 Encounters:   12/18/13 195 lb (88.451 kg)   12/04/13 195 lb (88.451 kg)   11/20/13 187 lb (84.823 kg)     BP Readings from Last 3 Encounters:   12/18/13 114/65   12/04/13 92/53   11/20/13 113/63       History of Present Illness:   [name] is seen in CHF shared medical appointments for his 4 th and final session.  He is followed for sCHF, CM, HTN, and cirrhosis.  He has met with cardiac rehab but is not starting until after their upcoming vacation.  We changed his Corgard to Coreg several weeks ago and he has tolerated this well.  He admits to some lightheadedness with rising quickly that is brief and resolves on its own.  He is limited mostly by his back pain.  Most recent blood work on 11/16/13 reviewed, stable taking note of K 4.9.      Andrew [name] describes symptoms including fatigue, lightheadedness and back pain but denies chest pain, dyspnea, palpitations, orthopnea, PND, early saiety, syncope.     NYHA class:  II    Assessment:    1. Chronic systolic CHF (congestive heart failure) ([place])    2. Non-ischemic cardiomyopathy ([place])    3. Essential hypertension    4. Cirrhosis, nonalcoholic ([place])          Andrew [name] participated in a shared medical appointment for CHF disease management including a 30 minute educational session on living with a chronic illness.  Evaluation and treatment changes were made as described above by nurse practitioner then given further instruction/ consultation with pharmacist.     Plan:   1. Stop the Amiloride completely  2. Start instead Spironolactone 50 mg once daily  3. Have blood work when you return from trip to [place]. Follow up with Dr. Jacqualyn Posey in November as scheduled          Daily Care at  Home  1. Did I weigh myself at home?   Yes   Weight:  190  2. Do I have a sodium restriction? yes   Na limit  2000  3. Do I eat out at restaurants often? No   Frequency 1X week  4. Do I have a fluid limitation? yes   Limit  64   5. Do I take all my medications?  Yes  6. Am I on an exercise program?  no   Activity  ---  7. Do I smoke?    No    Living with Heart Failure Questionnaire  1. I feel like my overall health is... fair  2. I have trouble sleeping at night.Marland KitchenMarland KitchenNever  3. I have problems breathing.Marland KitchenMarland KitchenSeldom   4. I have swelling in my legs/ ankles.Marland KitchenMarland KitchenFrequently  5. My appetite is ...good  6. My energy is... fair  7. My weight varies by 3 lbs or more .Marland KitchenMarland KitchenFrequently  8. I am dizzy or lightheaded.Marland KitchenMarland KitchenNever      No Known Allergies  Current Outpatient Prescriptions   Medication Sig Dispense Refill   ??? carvedilol (COREG) 6.25 MG tablet Take 1 tablet  by mouth 2 times daily (with meals) 60 tablet 3   ??? lisinopril (PRINIVIL;ZESTRIL) 5 MG tablet 5 mg in AM and 10 mg in PM 90 tablet 3   ??? traMADol (ULTRAM) 50 MG tablet Take 1 tablet by mouth every 12 hours as needed for Pain 60 tablet 2   ??? furosemide (LASIX) 40 MG tablet Take 0.5 tablets by mouth daily 30 tablet 3   ??? aMILoride (MIDAMOR) 5 MG tablet Take 5 mg by mouth daily     ??? lactulose 20 GM/30ML SOLN Take 30 mLs by mouth 2 times daily 5400 mL 1   ??? Cholecalciferol (VITAMIN D3) 1000 UNITS CAPS Take by mouth daily      ??? MILK THISTLE PO Take  by mouth.     ??? rifaximin (XIFAXAN) 550 MG tablet Take 550 mg by mouth 2 times daily.       ??? Multiple Vitamins-Minerals (CENTRUM SILVER PO) Take  by mouth daily.       No current facility-administered medications for this visit.       Past Medical History   Diagnosis Date   ??? Hypertension    ??? Kidney stones    ??? Alpha 1 antitrypsin deficiency    ??? Thrombocytopenia ([place]) 04/30/2009   ??? BPH (benign prostatic hyperplasia)    ??? 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 Chronic systolic CHF (congestive heart failure) ([place]) 10/18/2013    ??? CAD (coronary artery disease)      Non-obstructive   ??? Compression fracture of lumbar vertebra ([place])      multiple, lumbar     Past Surgical History   Procedure Laterality Date   ??? Av fistula repair  1970's   ??? Colonoscopy  4/12     5y   ??? Cardiac catheterization       Family History   Problem Relation Age of Onset   ??? Diabetes Mother    ??? Diabetes Sister    ??? Diabetes Brother      History     Social History   ??? Marital Status: Married     Spouse Name: N/A     Number of Children: N/A   ??? Years of Education: N/A     Occupational History   ??? Not on file.     Social History Main Topics   ??? Smoking status: Never Smoker    ??? Smokeless tobacco: Never Used      Comment: Passive smoke exposure: yes   ??? Alcohol Use: No   ??? Drug Use: No   ??? Sexual Activity: Not on file     Other Topics Concern   ??? Not on file     Social History Narrative       Review of System:  ?? General ROS: negative for - chills, fever   ?? Psychological ROS: negative for - anxiety or depression  ?? Ophthalmic ROS: negative for - eye pain or loss of vision  ?? ENT ROS: negative for - headaches, sore throat   ?? Allergy and Immunology ROS: negative for - hives  ?? Hematological and Lymphatic ROS: negative for - bleeding problems, blood clots, bruising or jaundice  ?? Endocrine ROS: negative for - skin changes, temperature intolerance or unexpected weight changes  ?? Respiratory ROS: negative for - cough, sputum, wheezing  ?? Cardiovascular ROS: Per HPI.   ?? Gastrointestinal ROS: negative for - abdominal pain, diarrhea, nausea/vomiting, bleeding   ?? Genito-Urinary ROS:  negative for - dysuria or incontinence  ?? Musculoskeletal ROS: negative for - joint swelling   ?? Neurological ROS: negative for - confusion, numbness/tingling, seizures, weakness  ?? Dermatological ROS: negative for - rash    Physical Examination:    Filed Vitals:    12/18/13 1352   BP: 114/65   Pulse: 55   Weight: 195 lb (88.451 kg)   SpO2: 98%        Constitutional and General Appearance: Warm  and dry, no apparent distress, normal coloration  HEENT:  Normocephalic, atraumatic  Respiratory:  ?? Normal excursion and expansion without use of accessory muscles  ?? Resp Auscultation: Normal breath sounds without dullness  Cardiovascular:  ?? The apical impulses not displaced  ?? Heart tones are crisp and normal  ?? JVP less than 8 cm H2O  ?? Regular rate and rhythm, normal U9W1, soft systolic murmur, no g/r/c  ?? Peripheral pulses are symmetrical and full  ?? There is no clubbing, cyanosis of the extremities.    ?? 1+ edema  ?? Pedal Pulses: 2+ and equal   Abdomen:  ?? No masses or tenderness  ?? Liver/Spleen: No Abnormalities Noted  Neurological/Psychiatric:  ?? Alert and oriented in all spheres  ?? Moves all extremities well  ?? Exhibits normal gait balance and coordination  ?? No abnormalities of mood, affect, memory, mentation, or behavior are noted    Cardiac Cath 10/03/13:  Anatomy:   LM-normal   LAD-50% mid with FFR of 0.79  Cx-normal, dominant  OM1- normal  RCA-normal  LVEF- 30%      Hemodynamics:  RA- mean 12  RV- 37  PAWP - mean 19    PASP- 40 mmHg    C.O. 5.6  Echo 09/05/2013--  Dilated left ventricle. Normal wall thickness. Decreased left ventricular systolic function with an estimated EF 30-35%.Inferior wall hypokinesis E/e=25. Mild to moderate mitral regurgitation. The left atrium is dilated. Trace of aortic insufficiency. Mild tricuspid regurgitation with RVSP estimated at 36 mmHg.The aorta is within normal limits.      BMP:   Lab Results   Component Value Date    NA 140 11/16/2013    NA 140 10/18/2013    NA 140 08/15/2013    K 4.9 11/16/2013    K 3.9 10/18/2013    K 4.6 08/15/2013    CL 106 11/16/2013    CL 103 10/18/2013    CL 105 08/15/2013    CO2 26 11/16/2013    CO2 29 10/18/2013    CO2 25 08/15/2013    PHOS 2.9 04/06/2010    PHOS 3.3 10/30/2009    PHOS 2.9 10/09/2009    BUN 14 11/16/2013    BUN 16 10/18/2013    BUN 13 08/15/2013    CREATININE 0.7 11/16/2013    CREATININE 0.7 10/18/2013    CREATININE 0.6  08/15/2013     BNP:   No results found for: BNP

## 2013-12-18 NOTE — Patient Instructions (Signed)
1. Stop the Amiloride completely  2. Start instead Spironolactone 50 mg once daily  3. Have blood work when you return from trip to [place]. Follow up with Dr. Jacqualyn Posey in November as scheduled

## 2014-01-10 LAB — CBC WITH AUTO DIFFERENTIAL
Basophils %: 0.5 %
Basophils Absolute: 0 10*3/uL (ref 0.0–0.2)
Eosinophils %: 2.4 %
Eosinophils Absolute: 0.1 10*3/uL (ref 0.0–0.6)
Hematocrit: 28 % — ABNORMAL LOW (ref 40.5–52.5)
Hemoglobin: 9.6 g/dL — ABNORMAL LOW (ref 13.5–17.5)
Lymphocytes %: 43.7 %
Lymphocytes Absolute: 1.5 10*3/uL (ref 1.0–5.1)
MCH: 39.1 pg — ABNORMAL HIGH (ref 26.0–34.0)
MCHC: 34.4 g/dL (ref 31.0–36.0)
MCV: 113.5 fL — ABNORMAL HIGH (ref 80.0–100.0)
MPV: 10 fL (ref 5.0–10.5)
Monocytes %: 10.4 %
Monocytes Absolute: 0.3 10*3/uL (ref 0.0–1.3)
Neutrophils %: 43 %
Neutrophils Absolute: 1.4 10*3/uL — ABNORMAL LOW (ref 1.7–7.7)
Platelets: 98 10*3/uL — ABNORMAL LOW (ref 135–450)
RBC: 2.46 M/uL — ABNORMAL LOW (ref 4.20–5.90)
RDW: 17.2 % — ABNORMAL HIGH (ref 12.4–15.4)
WBC: 3.3 10*3/uL — ABNORMAL LOW (ref 4.0–11.0)

## 2014-01-10 LAB — COMPREHENSIVE METABOLIC PANEL
ALT: 26 U/L (ref 10–40)
AST: 51 U/L — ABNORMAL HIGH (ref 15–37)
Albumin/Globulin Ratio: 0.7 — ABNORMAL LOW (ref 1.1–2.2)
Albumin: 2.3 g/dL — ABNORMAL LOW (ref 3.4–5.0)
Alkaline Phosphatase: 108 U/L (ref 40–129)
Anion Gap: 8 (ref 3–16)
BUN: 25 mg/dL — ABNORMAL HIGH (ref 7–20)
CO2: 26 mmol/L (ref 21–32)
Calcium: 8.4 mg/dL (ref 8.3–10.6)
Chloride: 105 mmol/L (ref 99–110)
Creatinine: 0.8 mg/dL (ref 0.8–1.3)
GFR African American: >60 (ref >60)
GFR Non-African American: >60 (ref >60)
Globulin: 3.3 g/dL
Glucose: 92 mg/dL (ref 70–99)
Potassium: 4.5 mmol/L (ref 3.5–5.1)
Sodium: 139 mmol/L (ref 136–145)
Total Bilirubin: 1.7 mg/dL — ABNORMAL HIGH (ref 0.0–1.0)
Total Protein: 5.6 g/dL — ABNORMAL LOW (ref 6.4–8.2)

## 2014-01-10 LAB — MAGNESIUM: Magnesium: 1.7 mg/dL — ABNORMAL LOW (ref 1.80–2.40)

## 2014-01-10 LAB — PROTIME-INR
INR: 1.3 — ABNORMAL HIGH (ref 0.85–1.16)
Protime: 14.9 s — ABNORMAL HIGH (ref 9.8–13.0)

## 2014-01-11 LAB — HEMOGLOBIN A1C
Hemoglobin A1C: 5 %
eAG: 96.8 mg/dL

## 2014-01-12 LAB — AFP TUMOR MARKER: AFP-Tumor Marker: 1 ng/mL (ref 0–9)

## 2014-01-14 ENCOUNTER — Ambulatory Visit: Admit: 2014-01-14 | Discharge: 2014-01-14 | Payer: MEDICARE | Attending: Internal Medicine | Primary: Internal Medicine

## 2014-01-14 DIAGNOSIS — Z23 Encounter for immunization: Secondary | ICD-10-CM

## 2014-01-14 LAB — VITAMIN B12 & FOLATE
Folate: 18.97 ng/mL — ABNORMAL HIGH (ref 3.10–17.50)
Vitamin B-12: >2000 pg/mL — ABNORMAL HIGH (ref 211–911)

## 2014-01-14 LAB — IRON AND TIBC: Iron: 166 ug/dL — ABNORMAL HIGH (ref 59–158)

## 2014-01-14 LAB — MAGNESIUM: Magnesium: 1.5 mg/dL — ABNORMAL LOW (ref 1.80–2.40)

## 2014-01-14 LAB — FERRITIN: Ferritin: 1006 ng/mL — ABNORMAL HIGH (ref 30.0–400.0)

## 2014-01-14 NOTE — Patient Instructions (Signed)
Anemia: Care Instructions  Your Care Instructions     Anemia is a low level of red blood cells, which carry oxygen throughout your body. Many things can cause anemia. Lack of iron is one of the most common causes. Your body needs iron to make hemoglobin, a substance in red blood cells that carries oxygen from the lungs to your body's cells. Without enough iron, the body produces fewer and smaller red blood cells. As a result, your body's cells do not get enough oxygen, and you feel tired and weak. And you may have trouble concentrating.  Bleeding is the most common cause of a lack of iron. You may have heavy menstrual bleeding or bleeding caused by conditions such as ulcers, hemorrhoids, or cancer. Regular use of aspirin or other anti-inflammatory medicines (such as ibuprofen) also can cause bleeding in some people. A lack of iron in your diet also can cause anemia, especially at times when the body needs more iron, such as during pregnancy, infancy, and the teen years.  Your doctor may have prescribed iron pills. It may take several months of treatment for your iron levels to return to normal. Your doctor also may suggest that you eat foods that are rich in iron, such as meat and beans.  There are many other causes of anemia. It is not always due to a lack of iron. Finding the specific cause of your anemia will help your doctor find the right treatment for you.  Follow-up care is a key part of your treatment and safety. Be sure to make and go to all appointments, and call your doctor if you are having problems. It's also a good idea to know your test results and keep a list of the medicines you take.  How can you care for yourself at home?  ?? Take your medicines exactly as prescribed. Call your doctor if you think you are having a problem with your medicine.  ?? If your doctor recommends iron pills, take them as directed:  ?? Try to take the pills on an empty stomach about 1 hour before or 2 hours after meals. But  you may need to take iron with food to avoid an upset stomach.  ?? Do not take antacids or drink milk or caffeine drinks (such as coffee, tea, or cola) at the same time or within 2 hours of the time that you take your iron. They can make it hard for your body to absorb the iron.  ?? Vitamin C (from food or supplements) helps your body absorb iron. Try taking iron pills with a glass of orange juice or some other food that is high in vitamin C, such as citrus fruits.  ?? Iron pills may cause stomach problems, such as heartburn, nausea, diarrhea, constipation, and cramps. Be sure to drink plenty of fluids, and include fruits, vegetables, and fiber in your diet each day. Iron pills often make your bowel movements dark or green.  ?? If you forget to take an iron pill, do not take a double dose of iron the next time you take a pill.  ?? Keep iron pills out of the reach of small children. An overdose of iron can be very dangerous.  ?? Follow your doctor's advice about eating iron-rich foods. These include red meat, shellfish, poultry, eggs, beans, raisins, whole-grain bread, and leafy green vegetables.  ?? Steam vegetables to help them keep their iron content.  When should you call for help?  Call 911 anytime you think   you may need emergency care. For example, call if:  ?? You have symptoms of a heart attack. These may include:  ?? Chest pain or pressure, or a strange feeling in the chest.  ?? Sweating.  ?? Shortness of breath.  ?? Nausea or vomiting.  ?? Pain, pressure, or a strange feeling in the back, neck, jaw, or upper belly or in one or both shoulders or arms.  ?? Lightheadedness or sudden weakness.  ?? A fast or irregular heartbeat.  After you call 911, the operator may tell you to chew 1 adult-strength or 2 to 4 low-dose aspirin. Wait for an ambulance. Do not try to drive yourself.  ?? You passed out (lost consciousness).  Call your doctor now or seek immediate medical care if:  ?? You have new or increased shortness of  breath.  ?? You are dizzy or lightheaded, or you feel like you may faint.  ?? Your fatigue and weakness continue or get worse.  ?? You have any abnormal bleeding, such as:  ?? Nosebleeds.  ?? Vaginal bleeding that is different (heavier, more frequent, at a different time of the month) than what you are used to.  ?? Bloody or black stools, or rectal bleeding.  ?? Bloody or pink urine.  Watch closely for changes in your health, and be sure to contact your doctor if:  ?? You do not get better as expected.   Where can you learn more?   Go to [email or web address] and sign in to your MyChart account. Enter R301 in the [place] box to learn more about ???Anemia: Care Instructions.???    If you do not have an account, please click on the ???Sign Up Now??? link.     ?? 2006-2015 Healthwise, Incorporated. Care instructions adapted under license by [facility]. This care instruction is for use with your licensed healthcare professional. If you have questions about a medical condition or this instruction, always ask your healthcare professional. [place] any warranty or liability for your use of this information.  Content Version: 10.6.465758; Current as of: April 27, 2013              Cirrhosis: Care Instructions  Your Care Instructions     Cirrhosis occurs when healthy tissue in your liver gets scarred. This keeps the liver from working well. It usually happens after a liver has been inflamed for years.  Cirrhosis is most often caused by alcohol abuse or hepatitis infection. But there are other causes too. These include medicines and too much fat in the liver. Conditions passed down in families and other disorders can also cause it. In some cases, no cause can be found.  Treatment can't completely fix liver damage. But you may be able to slow or prevent more damage if you don't drink alcohol or use drugs that harm your liver.  Follow-up care is a key part of your treatment and  safety. Be sure to make and go to all appointments, and call your doctor if you are having problems. It's also a good idea to know your test results and keep a list of the medicines you take.  How can you care for yourself at home?  ?? Do not drink any alcohol. It can harm your liver. Talk to your doctor if you need help to stop drinking.  ?? Be safe with medicines. Take your medicines exactly as prescribed. Call your doctor if you think you are having a problem with your medicine.  ??  Talk to your doctor before you take any other medicines. These include over-the-counter medicines and herbal products.  ?? Be careful taking acetaminophen (Tylenol), ibuprofen (Advil, Motrin), or naproxen (Aleve). These can sometimes cause more liver damage. Talk with your doctor if you're not sure which medicines are safe.  ?? If your cirrhosis causes extra fluid to build up in your body, try not to eat a lot of salt. Use less salt when you cook and at the table. Don't eat fast foods or snack foods with a lot of salt. Extra fluid in your belly, legs, and chest can cause serious problems.  ?? Work with your doctor or a dietitian to be sure you eat the right amount of carbohydrate, protein, fat, and sodium (salt). It's very important to choose the best foods for the health of your liver.  ?? If your doctor recommends it, limit how much fluid you drink.  ?? If your doctor recommends it, get more exercise. Walking is a good choice. Bit by bit, increase the amount you walk every day. Try for at least 30 minutes on most days of the week. You also may want to swim, bike, or do other activities.  When should you call for help?  Call 911 anytime you think you may need emergency care. For example, call if:  ?? You passed out (lost consciousness).  Call your doctor now or seek immediate medical care if:  ?? You feel very sleepy or confused.  ?? You have new belly pain, or your pain gets worse.  ?? You have a fever.  ?? There is a new or increasing yellow  tint to your skin or the whites of your eyes.  ?? You have any abnormal bleeding, such as:  ?? Nosebleeds.  ?? Vaginal bleeding that is different (heavier, more frequent, at a different time of the month) than what you are used to.  ?? Bloody or black stools, or rectal bleeding.  ?? Bloody or pink urine.  Watch closely for changes in your health, and be sure to contact your doctor if you have any problems.   Where can you learn more?   Go to [email or web address] and sign in to your MyChart account. Enter M412 in the [place] box to learn more about ???Cirrhosis: Care Instructions.???    If you do not have an account, please click on the ???Sign Up Now??? link.     ?? 2006-2015 Healthwise, Incorporated. Care instructions adapted under license by [facility]. This care instruction is for use with your licensed healthcare professional. If you have questions about a medical condition or this instruction, always ask your healthcare professional. [place] any warranty or liability for your use of this information.  Content Version: 10.6.465758; Current as of: January 19, 2013

## 2014-01-14 NOTE — Progress Notes (Signed)
[facility]  Internal Medicine  Patient Encounter  Andrew [name], D.O., [facility]        Chief Complaint   Patient presents with   ??? 3 Month Follow-Up     chronic medical problems       HPI: 73 y.o. male seen today for follow up regarding chronic medical problems. He has been feeling OK.      HTN-- BP checks at home:No   Pt denies blurred vision, chest pain and palpitations.  Pt complains of dyspnea and peripheral edema. Tolerating medications: No.      Chronic Systolic CHF-- She was last seen by cardiology CNP 12/18/2013.  He has had LAB.  He denies CP, SOB, orthopnea.  He does have swelling.  He tries to elevate legs.  He does adhere to lower salt diet. He is in cardiac rehab.  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      Cirrhosis-- He is under the care of Dr. Einar Grad.  He will be seen today.  LAB shows the expected thrombocytopenia and Leukopenia.  T. Bili was 1.7.  INR was 1.3      Recent LAB showed a worsening anemia.  He denies Hematochezia or Melena.  He does not drink alcohol.  Hgb was much worse than in the past.  His last colonoscopy 06/2010.  He has yearly EGD.  He does have a H/O Ulcers in the stomach.   RBC indices-- macrocytic.   Have to consider Myelodysplasia.      Magnesium was slightly low at 1.7    Low back pain.  Chronic.  He had Compression fracture and L-DDD.  DEXA 08/2013-- Osteopenia.  He is on Tramadol.  He was seeing Dr. Andrey Cota (Neurosurgery).      Past Medical History   Diagnosis Date   ??? Hypertension    ??? Kidney stones    ??? Alpha 1 antitrypsin deficiency    ??? Thrombocytopenia ([place]) 04/30/2009   ??? BPH (benign prostatic hyperplasia)    ??? 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 Chronic systolic CHF (congestive heart failure) ([place]) 10/18/2013   ??? CAD (coronary artery disease)      Non-obstructive   ??? Compression fracture of lumbar vertebra (H[facility])      multiple, lumbar       Review of Systems - As per HPI      OBJECTIVE:  BP 115/64 mmHg   Pulse 58   Wt 193 lb (87.544 kg)  GEN: NAD,  A&O, Non-toxic  HEENT: NC/AT, PERL, EOMI, Oral cavity Clear,  TM's NL, Nasal cavity clear.    NECK: Supple.  No thyromegaly.  LYMPH: No C/SC nodes.  CV: S1 S2 NL, RRR.  No murmurs, clicks or rubs.  PULM: CTA, symmentric air exchange  EXT: No C/C/E  GI: Abdomen is soft, NT, BS+, No hepatomegaly.  NEURO: No focal or lateralizing deficits.  VASC:  No carotid bruits.  Pulses symmetric  MS:  Increased thoracic Kyphosis.  No major tenderness of the T/L paraspinals.      ASSESSMENT::  Encounter Diagnoses   Name Primary?   ??? Macrocytic anemia    ??? Hypomagnesemia    ??? Benign essential HTN    ??? Alpha-1-antitrypsin deficiency    ??? Needs flu shot    ??? Need for pneumococcal vaccination Yes   ??? Lumbar compression fracture, with routine healing, subsequent encounter    ??? Chronic low back pain    ??? Cirrhosis, nonalcoholic (  [place])    ??? Chronic systolic CHF (congestive heart failure) ([place])    ??? Thrombocytopenia    ??? Leukopenia, unspecified decreased WBC count    ??? Osteopenia    ??? Mild CAD    ??? Coagulopathy ([place])        Plan:  1. Flu Vaccine  2. Prevnar  3. LAB  4. Needs to see Dr. Einar Grad for repeat EGD and possible colonoscopy  5. May need to see Hematology.    6. Refer back to Dr. Ardyth Harps    Discussed medications with patient who voiced understanding of their use, indication and potential side effects.  Pt also understands the above recommendations.   All questions answered.

## 2014-01-15 LAB — CBC WITH AUTO DIFFERENTIAL
Basophils %: 0.6 %
Basophils Absolute: 0 10*3/uL (ref 0.0–0.2)
Eosinophils %: 2.4 %
Eosinophils Absolute: 0.1 10*3/uL (ref 0.0–0.6)
Hematocrit: 31.3 % — ABNORMAL LOW (ref 40.5–52.5)
Hemoglobin: 10.7 g/dL — ABNORMAL LOW (ref 13.5–17.5)
Lymphocytes %: 40.6 %
Lymphocytes Absolute: 1.5 10*3/uL (ref 1.0–5.1)
MCH: 39.1 pg — ABNORMAL HIGH (ref 26.0–34.0)
MCHC: 34 g/dL (ref 31.0–36.0)
MCV: 114.7 fL — ABNORMAL HIGH (ref 80.0–100.0)
MPV: 9.5 fL (ref 5.0–10.5)
Monocytes %: 11.1 %
Monocytes Absolute: 0.4 10*3/uL (ref 0.0–1.3)
Neutrophils %: 45.3 %
Neutrophils Absolute: 1.7 10*3/uL (ref 1.7–7.7)
PLATELET SLIDE REVIEW: DECREASED
Platelets: 111 10*3/uL — ABNORMAL LOW (ref 135–450)
RBC: 2.73 M/uL — ABNORMAL LOW (ref 4.20–5.90)
RDW: 17.9 % — ABNORMAL HIGH (ref 12.4–15.4)
WBC: 3.7 10*3/uL — ABNORMAL LOW (ref 4.0–11.0)

## 2014-01-15 MED ORDER — MAGNESIUM OXIDE 400 (241.3 MG) MG PO TABS
400 (241.3 Mg) MG | ORAL_TABLET | Freq: Two times a day (BID) | ORAL | Status: DC
Start: 2014-01-15 — End: 2014-04-26

## 2014-01-16 ENCOUNTER — Ambulatory Visit
Admit: 2014-01-16 | Discharge: 2014-01-16 | Payer: MEDICARE | Attending: Cardiovascular Disease | Primary: Internal Medicine

## 2014-01-16 ENCOUNTER — Inpatient Hospital Stay: Admit: 2014-01-16 | Attending: Adult Health | Primary: Internal Medicine

## 2014-01-16 DIAGNOSIS — I5022 Chronic systolic (congestive) heart failure: Secondary | ICD-10-CM

## 2014-01-16 NOTE — Progress Notes (Signed)
[place]   Cardiac Evaluation      Andrew [name]  Date of Birth:  09/25/1940    Date of Visit:  01/16/14      Chief Complaint   Patient presents with   ??? Congestive Heart Failure     echo today        History of Present Illness: Patient is a 73 y.o.  male seen for cardiomyopathy. He had and echo for an abnormal ECG, Dyspnea/SOB and Edema.  He is retired and has more of a sedimentary life style. He has had chronic leg swelling. He can not sleep lying flat; he sleeps in a recliner. He does not elevate his legs. He does not wear compression hose. He is not aware of chest tightness or MI. He has no known family history of heart disease. Father died at age 34 of old age, his mother died of liver disease at age 17, and a brother died of leukemia. He has Cirrhosis and is managed medically by Dr. Lucita [name]. He states that the liver function is stable. He takes nadolol. He does not smoke, no history of smoking. He quit drinking alcohol in 1973. His liver problem is thought to be from alpha 1 anti-trypsin, he and his daughter has the contributing factors.he has been on Lisinopril for his BP and it was changed to add a water pill in combination.     Today, Andrew [name] reports that he feels about the same as he did at his last visit. He has been going to the CHF classes and states that they were informative for him. He is also in cardiac rehab at [facility] three days a week. He is here today with his wife.He denies chest pain, SOB, palpitations, no edema noted. His H/H has been dropping over the past year, and he has an endoscopy and colonoscopy planned soon. He denies black or tarry stools.       No Known Allergies  Current Outpatient Prescriptions   Medication Sig Dispense Refill   ??? spironolactone (ALDACTONE) 50 MG tablet Take 1 tablet by mouth daily 30 tablet 3   ??? carvedilol (COREG) 6.25 MG tablet Take 1 tablet by mouth 2 times daily (with meals) 60 tablet 3   ??? lisinopril (PRINIVIL;ZESTRIL) 5 MG tablet 5 mg in AM and 10  mg in PM 90 tablet 3   ??? traMADol (ULTRAM) 50 MG tablet Take 1 tablet by mouth every 12 hours as needed for Pain 60 tablet 2   ??? furosemide (LASIX) 40 MG tablet Take 0.5 tablets by mouth daily 30 tablet 3   ??? lactulose 20 GM/30ML SOLN Take 30 mLs by mouth 2 times daily 5400 mL 1   ??? Cholecalciferol (VITAMIN D3) 1000 UNITS CAPS Take by mouth daily      ??? MILK THISTLE PO Take  by mouth.     ??? rifaximin (XIFAXAN) 550 MG tablet Take 550 mg by mouth 2 times daily.       ??? Multiple Vitamins-Minerals (CENTRUM SILVER PO) Take  by mouth daily.     ??? magnesium oxide (MAGOX 400) 400 (241.3 MG) MG TABS tablet Take 1 tablet by mouth 2 times daily 60 tablet 2     No current facility-administered medications for this visit.       Past Medical History   Diagnosis Date   ??? Hypertension    ??? Kidney stones    ??? Alpha 1 antitrypsin deficiency    ??? Thrombocytopenia ([place]) 04/30/2009   ???  BPH (benign prostatic hyperplasia)    ??? 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 Chronic systolic CHF (congestive heart failure) ([place]) 10/18/2013   ??? CAD (coronary artery disease)      Non-obstructive   ??? Compression fracture of lumbar vertebra ([place])      multiple, lumbar     Past Surgical History   Procedure Laterality Date   ??? Av fistula repair  1970's   ??? Colonoscopy  4/12     5y   ??? Cardiac catheterization       Family History   Problem Relation Age of Onset   ??? Diabetes Mother    ??? Diabetes Sister    ??? Diabetes Brother      History     Social History   ??? Marital Status: Married     Spouse Name: N/A     Number of Children: N/A   ??? Years of Education: N/A     Occupational History   ??? Not on file.     Social History Main Topics   ??? Smoking status: Never Smoker    ??? Smokeless tobacco: Never Used      Comment: Passive smoke exposure: yes   ??? Alcohol Use: No   ??? Drug Use: No   ??? Sexual Activity: Not on file     Other Topics Concern   ??? Not on file     Social History Narrative       Review of Systems:   ?? Constitutional: there has been no  unanticipated weight loss. There's been no change in energy level, sleep pattern, or activity level.     ?? Eyes: No visual changes or diplopia. No scleral icterus.  ?? ENT: No Headaches, hearing loss or vertigo. No mouth sores or sore throat.  ?? Cardiovascular: Reviewed in HPI.  ?? Respiratory: No cough or wheezing, no sputum production. No hematemesis.    ?? Gastrointestinal: No abdominal pain, appetite loss, blood in stools. No change in bowel or bladder habits.  ?? Genitourinary: No dysuria, trouble voiding, or hematuria.  ?? Musculoskeletal:  No gait disturbance, weakness or joint complaints.  ?? Integumentary: No rash or pruritis noted.  ?? Neurological: No headache, diplopia, change in muscle strength, numbness or tingling. No change in gait, balance, coordination, mood, affect, memory, mentation, behavior.  ?? Psychiatric: No anxiety, no depression.  ?? Endocrine: No malaise, fatigue or temperature intolerance. No excessive thirst, fluid intake, or urination. No tremor.  ?? Hematologic/Lymphatic: No abnormal bruising or bleeding, blood clots or swollen lymph nodes.  ?? Allergic/Immunologic: No nasal congestion or hives.    Physical Examination:    Filed Vitals:    01/16/14 1434   BP: 96/58   Pulse: 54   Weight: 195 lb (88.451 kg)   SpO2: 95%     Body mass index is 32.45 kg/(m^2).     Wt Readings from Last 3 Encounters:   01/16/14 195 lb (88.451 kg)   01/14/14 193 lb (87.544 kg)   12/18/13 195 lb (88.451 kg)     BP Readings from Last 3 Encounters:   01/16/14 96/58   01/14/14 115/64   12/18/13 114/65     Constitutional and General Appearance:   WD/WN in NAD  HEENT:  NC/AT  Respiratory:  ?? Normal excursion and expansion without use of accessory muscles  ?? Resp Auscultation: Normal breath sounds without dullness  Cardiovascular:  ?? The apical impulses not displaced  ?? Heart tones  are crisp and normal  ?? Cervical veins are not engorged  ?? The carotid upstroke is normal in amplitude and contour without delay or bruit  ?? JVP  less than 8 cm H2O  RRR with nl S1 and S2 without m,r,g  ?? Peripheral pulses are symmetrical and full  ?? There is no clubbing, cyanosis of the extremities.  ?? No edema  ?? Femoral Arteries: 2+ and equal  ?? Pedal Pulses: 2+ and equal   Abdomen:  ?? No masses or tenderness  ?? Liver/Spleen: No Abnormalities Noted  Neurological/Psychiatric:  ?? Alert and oriented in all spheres  ?? Moves all extremities well  ?? Exhibits normal gait balance and coordination  ?? No abnormalities of mood, affect, memory, mentation, or behavior are noted    Cardiac Cath 10/03/13:  Anatomy:   LM-normal   LAD-50% mid with FFR of 0.79  Cx-normal, dominant  OM1- normal  RCA-normal  LVEF- 30%      Hemodynamics:  RA- mean 12  RV- 37  PAWP - mean 19   PASP- 40 mmHg    C.O. 5.6    Echo 01/16/2014: EF 40-45%, final results are pending.    Echo 09/05/2013--  Dilated left ventricle. Normal wall thickness. Decreased left ventricular systolic function with an estimated EF 30-35%.Inferior wall hypokinesis E/e=25. Mild to moderate mitral regurgitation. The left atrium is dilated. Trace of aortic insufficiency. Mild tricuspid regurgitation with RVSP estimated at 36 mmHg.The aorta is within normal limits.      Assessment:    Systolic CHF, chronic -- Echo 09/05/2013 shows EF 30-35%, mild-mod MR, RVSP 36 mmHg. 01/2014 EF up to 40-45%.    Plan:   1. Follow up in 4 months  2. No changes in medications today      I appreciate the opportunity of cooperating in the care of this patient.    Marissa Nestle, M.D., [facility]

## 2014-02-13 MED ORDER — TRAMADOL HCL 50 MG PO TABS
50 MG | ORAL_TABLET | Freq: Two times a day (BID) | ORAL | Status: DC | PRN
Start: 2014-02-13 — End: 2014-03-07

## 2014-02-13 NOTE — Telephone Encounter (Signed)
From: [name]  To: Alecia Lemming, DO  Sent: 02/13/2014 11:56 AM EST  Subject: Medication Renewal Request    Original authorizing provider: Elayne Snare Kotzin, DO    [name] would like a refill of the following medications:  traMADol (ULTRAM) 50 MG tablet [Scott Melany Guernsey, DO]    Preferred pharmacy: [facility], [place] [phone] - F [phone]    Comment:

## 2014-02-13 NOTE — Telephone Encounter (Signed)
Last OV 01/14/14  Last Refill 11/16/13 #60 2 RF  [place] Report 02/13/14

## 2014-03-07 MED ORDER — TRAMADOL HCL 50 MG PO TABS
50 MG | ORAL_TABLET | Freq: Two times a day (BID) | ORAL | Status: DC | PRN
Start: 2014-03-07 — End: 2014-09-19

## 2014-03-07 NOTE — Telephone Encounter (Signed)
From: [name]  To: Alecia Lemming, DO  Sent: 03/05/2014 4:26 PM EST  Subject: Medication Renewal Request    Original authorizing provider: Elayne Snare Kotzin, DO    [name] would like a refill of the following medications:  traMADol (ULTRAM) 50 MG tablet [Scott Melany Guernsey, DO]    Preferred pharmacy: [facility], [place] [phone] - F [phone]    Comment:

## 2014-03-18 MED ORDER — CARVEDILOL 6.25 MG PO TABS
6.25 MG | ORAL_TABLET | Freq: Two times a day (BID) | ORAL | Status: DC
Start: 2014-03-18 — End: 2014-06-24

## 2014-03-18 NOTE — Telephone Encounter (Signed)
Sent!

## 2014-03-18 NOTE — Telephone Encounter (Signed)
From: [name]  To: Donata Duff, NP  Sent: 03/15/2014 5:01 PM EST  Subject: Medication Renewal Request    Original authorizing provider: Bernadette Hoit, NP    [name] would like a refill of the following medications:  carvedilol (COREG) 6.25 MG tablet Bernadette Hoit, NP]    Preferred pharmacy: [facility], [place] [phone] - F [phone]    Comment:

## 2014-03-20 MED ORDER — SPIRONOLACTONE 50 MG PO TABS
50 MG | ORAL_TABLET | ORAL | Status: DC
Start: 2014-03-20 — End: 2014-04-03

## 2014-03-20 NOTE — Telephone Encounter (Signed)
Sent!

## 2014-04-01 ENCOUNTER — Emergency Department: Admit: 2014-04-01 | Primary: Internal Medicine

## 2014-04-01 ENCOUNTER — Emergency Department: Primary: Internal Medicine

## 2014-04-01 ENCOUNTER — Inpatient Hospital Stay
Admit: 2014-04-02 | Discharge: 2014-04-03 | Disposition: A | Discharge status: Home / Self Care | Payer: MEDICARE | Source: Home / Self Care | Admitting: Internal Medicine

## 2014-04-01 DIAGNOSIS — I959 Hypotension, unspecified: Principal | ICD-10-CM

## 2014-04-01 LAB — URINALYSIS WITH REFLEX TO CULTURE
Bilirubin Urine: NEGATIVE
Blood, Urine: NEGATIVE
Glucose, Ur: NEGATIVE mg/dL
Ketones, Urine: NEGATIVE mg/dL
Leukocyte Esterase, Urine: NEGATIVE
Nitrite, Urine: NEGATIVE
Protein, UA: NEGATIVE mg/dL
Specific Gravity, UA: 1.013 (ref 1.005–1.030)
Urobilinogen, Urine: 0.2 E.U./dL (ref <2.0)
pH, UA: 7 (ref 5.0–8.0)

## 2014-04-01 LAB — COMPREHENSIVE METABOLIC PANEL
ALT: 27 U/L (ref 10–40)
AST: 52 U/L — ABNORMAL HIGH (ref 15–37)
Albumin/Globulin Ratio: 0.6 — ABNORMAL LOW (ref 1.1–2.2)
Albumin: 2.2 g/dL — ABNORMAL LOW (ref 3.4–5.0)
Alkaline Phosphatase: 92 U/L (ref 40–129)
Anion Gap: 5 (ref 3–16)
BUN: 33 mg/dL — ABNORMAL HIGH (ref 7–20)
CO2: 28 mmol/L (ref 21–32)
Calcium: 9 mg/dL (ref 8.3–10.6)
Chloride: 105 mmol/L (ref 99–110)
Creatinine: 1.4 mg/dL — ABNORMAL HIGH (ref 0.8–1.3)
GFR African American: >60 (ref >60)
GFR Non-African American: 50 — AB (ref >60)
Globulin: 4 g/dL
Glucose: 126 mg/dL — ABNORMAL HIGH (ref 70–99)
Potassium: 4.9 mmol/L (ref 3.5–5.1)
Sodium: 138 mmol/L (ref 136–145)
Total Bilirubin: 1.1 mg/dL — ABNORMAL HIGH (ref 0.0–1.0)
Total Protein: 6.2 g/dL — ABNORMAL LOW (ref 6.4–8.2)

## 2014-04-01 LAB — CBC WITH AUTO DIFFERENTIAL
Bands Relative: 5 % (ref 0–7)
Basophils %: 0 %
Basophils Absolute: 0 10*3/uL (ref 0.0–0.2)
Eosinophils %: 2 %
Eosinophils Absolute: 0 10*3/uL (ref 0.0–0.6)
Hematocrit: 25.9 % — ABNORMAL LOW (ref 40.5–52.5)
Hemoglobin: 8.9 g/dL — ABNORMAL LOW (ref 13.5–17.5)
Lymphocytes %: 46 %
Lymphocytes Absolute: 1.1 10*3/uL (ref 1.0–5.1)
MCH: 39.7 pg — ABNORMAL HIGH (ref 26.0–34.0)
MCHC: 34.4 g/dL (ref 31.0–36.0)
MCV: 115.5 fL — ABNORMAL HIGH (ref 80.0–100.0)
MPV: 9.7 fL (ref 5.0–10.5)
Monocytes %: 7 %
Monocytes Absolute: 0.2 10*3/uL (ref 0.0–1.3)
Neutrophils %: 40 %
Neutrophils Absolute: 1 10*3/uL — ABNORMAL LOW (ref 1.7–7.7)
PLATELET SLIDE REVIEW: DECREASED
Platelets: 67 10*3/uL — ABNORMAL LOW (ref 135–450)
RBC: 2.24 M/uL — ABNORMAL LOW (ref 4.20–5.90)
RDW: 18.9 % — ABNORMAL HIGH (ref 12.4–15.4)
WBC: 2.3 10*3/uL — ABNORMAL LOW (ref 4.0–11.0)

## 2014-04-01 LAB — TROPONIN: Troponin: 0.05 ng/mL — ABNORMAL HIGH (ref <0.01)

## 2014-04-01 LAB — BRAIN NATRIURETIC PEPTIDE: Pro-BNP: 504 pg/mL — ABNORMAL HIGH (ref 0–124)

## 2014-04-01 MED ORDER — SODIUM CHLORIDE 0.9 % IV BOLUS
0.9 % | Freq: Once | INTRAVENOUS | Status: AC
Start: 2014-04-01 — End: 2014-04-01
  Administered 2014-04-01: 20:00:00 1000 mL via INTRAVENOUS

## 2014-04-01 MED ORDER — TETRACAINE HCL 0.5 % OP SOLN
0.5 % | OPHTHALMIC | Status: AC
Start: 2014-04-01 — End: 2014-04-02

## 2014-04-01 MED ORDER — SODIUM CHLORIDE 0.9 % IV BOLUS
0.9 % | Freq: Once | INTRAVENOUS | Status: AC
Start: 2014-04-01 — End: 2014-04-01
  Administered 2014-04-01: 22:00:00 1000 mL via INTRAVENOUS

## 2014-04-01 MED FILL — TETRACAINE HCL 0.5 % OP SOLN: 0.5 % | OPHTHALMIC | Qty: 15

## 2014-04-01 NOTE — ED Notes (Signed)
Pt is alert and oriented times 3, with intermittent confusion and slow to respond. Pt here today with c/o low BP. Pt was at cardiac rehab and had c/o dizziness. Cardiac rehab called and stated that his BP was in the 80s/ 40's. Pt states that he is dizzy. Cardiac rehab also stated pt has not been himself. Pt's wife at bedside states that he has been off for a couple days. Pt able to move all extremities and follow directions. Pt placed on monitor. IV started labs drawn. Call light in reach will continue to monitor.     Royal Piedra, RN  04/01/14 1544

## 2014-04-01 NOTE — ED Provider Notes (Signed)
I independently performed a history and physical on [employer]   All diagnostic, treatment, and disposition decisions were made by myself in conjunction with the advanced practice provider.     Briefly, this is a 74 y.o. male here for hypertension.  The patient was at cardiac rehab today when he was found to have low blood pressure.  Despite this, the patient denies shortness of breath, chest pain, and other symptoms.  His pressure was as low as the 80s over 40s.  He has had blurred vision in his right eye for the last couple of weeks.  He states it feels like there is a "film" over his right eye. This is unchanged today.     On exam, the patient's mental status is normal.  His lungs are clear to auscultation bilaterally.  His heart is recurrent sinus.  He has a nonreactive pupil on the right side.  There are no visual field deficits in either eye when checked unilaterally.  Patient is able to read number of fingers at 2 feet.  Funduscopic exam is unremarkable bilaterally.    EKG  The Ekg interpreted by me in the absence of a cardiologist shows.  sinus bradycardia, rate=58  Axis is   Normal  QTc is  normal  Intervals and Durations are unremarkable.      ST segment changes seen in leads II, III, aVF, V5, and V6.  The changes are subtle, but may be associated with acute ischemia  No significant change from prior EKG dated 10/03/2013    EKG #2  The Ekg interpreted by me in the absence of a cardiologist shows.  sinus bradycardia, rate=59  Axis is   Left axis deviation  QTc is  normal  Intervals and Durations are unremarkable.      No specific ST-T wave changes appreciated.  No evidence of acute ischemia.   ST segment changes seen previously have now resolved        MDM  After receiving IV fluids, the patient's blood pressure stabilized and has remained normal ever since.    D/w Dr. Candiss Norse, on call cardiology. We thoroughly discussed his EKG findings, troponin elevation, and presentation. He recommends continued  monitoring only at this time. No need for acute cardiac intervention.    D/w Dr. Sherrian Divers, ophthalmology at [facility].  He cannot give me recommendations on the patient's ophthalmologic changes.  If the patient needs to be seen right away, he recommends transfer to UC, but cannot make that recommendation himself.  Given that the patient's ophthalmologic changes going on for the last 2 weeks, this is not acute and likely unrelated to what is going on today.  He still needs to be seen by ophthalmologist, does not need an emergent consultation tonight.  He would otherwise be admitted here.  All of his previous cardiac care has been here and the patient would be better suited to remain in our hospital for admission to be cared for by his own physicians.    Sonny Dandy, [place], discussed the case with the admitting physician, Dr. Tasia Catchings.      The total Critical Care time is 45 minutes which excludes separately billable procedures.    For further details of [facility] emergency department encounter, please see documentation by advanced practice provider  Sonny Dandy, [place], MD  04/01/14 [phone]

## 2014-04-01 NOTE — ED Notes (Signed)
This RN completed second EKG. Dr. Loree Fee at bedside. Fluids stopped.    Alvina Filbert, RN  04/01/14 [phone]

## 2014-04-01 NOTE — Progress Notes (Addendum)
Pt arrived to [facility] Rm 2907. Attached to monitors and oriented to room/call light. Assessment and admission completed. Urinal within reach. Bed alarm activated. Wife at bedside.    [place]

## 2014-04-01 NOTE — Progress Notes (Signed)
Report rec'd. Awaiting pt arrival to room 2907.     Jerolyn Center  04/01/2014

## 2014-04-01 NOTE — H&P (Signed)
History and Physical  Dr. Tasia Catchings  04/01/2014    PCP: Alecia Lemming, DO    Cc: Andrew [name] is a 74 y.o. male who presents with Hypotension.        HPI: 74 y.o. male presents from cardiac rehab with complaints of hypotension and dizziness.  No syncope. He does have a recent history of abnormal echo, but denies any chest pain or shortness of breath at this time. He does report intermittent blurred vision and that he still states he experiences from his right eye every morning. He states that the day goes on this resolves itself. He denies any other visual disturbances, weakness, numbness or abnormal coordination. Patient's wife reports that he has been slightly more confused over the past few weeks as well. Patient has no known history of dementia or any other neurological conditions including stroke. He denies any headaches or neck pain. No known fevers or chills. No other recent illness. He has no other complaints or concerns at this time.    Pt states his normal eye check 3 mos ago was OK. Was not told of fixed R pupil at that time.    Pt denies fevers, denies chills.  Pt denies chest pain, denies SOB, denies nausea, denies vomiting, denies headache.        PMHX:   Past Medical History   Diagnosis Date   ??? Hypertension    ??? Kidney stones    ??? Alpha 1 antitrypsin deficiency    ??? Thrombocytopenia ([place]) 04/30/2009   ??? BPH (benign prostatic hyperplasia)    ??? 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 Chronic systolic CHF (congestive heart failure) ([place]) 10/18/2013   ??? CAD (coronary artery disease)      Non-obstructive   ??? Compression fracture of lumbar vertebra (H[facility])      multiple, lumbar       PSHX:   Past Surgical History   Procedure Laterality Date   ??? Av fistula repair  1970's   ??? Colonoscopy  4/12     5y   ??? Cardiac catheterization         SocHx:   History     Social History   ??? Marital Status: Married     Spouse Name: N/A     Number of Children: N/A   ??? Years of Education: N/A     Occupational History   ??? Not  on file.     Social History Main Topics   ??? Smoking status: Never Smoker    ??? Smokeless tobacco: Never Used      Comment: Passive smoke exposure: yes   ??? Alcohol Use: No   ??? Drug Use: No   ??? Sexual Activity: Not on file     Other Topics Concern   ??? Not on file     Social History Narrative       FAM HX:   Family History   Problem Relation Age of Onset   ??? Diabetes Mother    ??? Diabetes Sister    ??? Diabetes Brother        Code Status: No Order      Meds - list of home medications reviewed in electronic chart and confirmed  No current facility-administered medications on file prior to encounter.     Current Outpatient Prescriptions on File Prior to Encounter   Medication Sig Dispense Refill   ??? spironolactone (ALDACTONE) 50 MG tablet TAKE ONE TABLET BY MOUTH  ONCE DAILY 30 tablet 3   ??? carvedilol (COREG) 6.25 MG tablet Take 1 tablet by mouth 2 times daily (with meals) 60 tablet 3   ??? traMADol (ULTRAM) 50 MG tablet Take 1 tablet by mouth every 12 hours as needed for Pain 60 tablet 0   ??? magnesium oxide (MAGOX 400) 400 (241.3 MG) MG TABS tablet Take 1 tablet by mouth 2 times daily 60 tablet 2   ??? lisinopril (PRINIVIL;ZESTRIL) 5 MG tablet 5 mg in AM and 10 mg in PM 90 tablet 3   ??? furosemide (LASIX) 40 MG tablet Take 0.5 tablets by mouth daily 30 tablet 3   ??? lactulose 20 GM/30ML SOLN Take 30 mLs by mouth 2 times daily 5400 mL 1   ??? Cholecalciferol (VITAMIN D3) 1000 UNITS CAPS Take by mouth daily      ??? MILK THISTLE PO Take  by mouth.     ??? rifaximin (XIFAXAN) 550 MG tablet Take 550 mg by mouth 2 times daily.       ??? Multiple Vitamins-Minerals (CENTRUM SILVER PO) Take  by mouth daily.             No Known Allergies    ROS: As noted above in HPI, otherwise 10 system review gone over with patient and is otherwise negative.    Active Hospital Problems    Diagnosis Date Noted   ??? Hypotension [I95.9] 04/01/2014   ??? Abnormal EKG [R94.31] 04/01/2014   ??? Visual changes [H53.9] 04/01/2014   ??? 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 rate 16, height 5\' 5"  (1.651 m), weight 191 lb (86.637 kg), SpO2 92 %.  BP 78/40 mmHg   Pulse 68   Temp(Src) 98.2 ??F (36.8 ??C) (Temporal)   Resp 18   Ht 5\' 5"  (1.651 m)   Wt 179 lb 3.7 oz (81.3 kg)   BMI 29.83 kg/m2   SpO2 94%    General Appearance:    Alert, cooperative, no distress, appears stated age   Head:    Normocephalic, without obvious abnormality, atraumatic   Eyes:    PERRL, conjunctiva/corneas clear, EOM's intact, fundi     benign, both eyes        Ears:    Normal TM's and external ear canals, both ears   Nose:   Nares normal, septum midline, mucosa normal, no drainage    or sinus tenderness   Throat:   Lips, mucosa, and tongue normal; teeth and gums normal   Neck:   Supple, symmetrical, trachea midline, no adenopathy;        thyroid:  No enlargement/tenderness/nodules; no carotid    bruit or JVD   Back:     Symmetric, no curvature, ROM normal, no CVA tenderness   Lungs:     Clear to auscultation bilaterally, respirations unlabored   Chest wall:    No tenderness or deformity   Heart:    Regular rate and rhythm, S1 and S2 normal, no murmur, rub   or gallop   Abdomen:     Soft, non-tender, bowel sounds active all four quadrants,     no masses, no organomegaly   Genitalia:    Normal male without lesion, discharge or tenderness   Rectal:    Normal tone, normal prostate, no masses or tenderness;    guaiac negative stool   Extremities:   Extremities normal,  atraumatic, no cyanosis or edema   Pulses:   2+ and symmetric all extremities   Skin:   Skin color, texture, turgor normal, no rashes or lesions   Lymph nodes:   Cervical, supraclavicular, and axillary nodes normal   Neurologic:  fixed dilated pupil on R         LABS:  Labs Reviewed   CBC WITH AUTO DIFFERENTIAL - Abnormal; Notable for the following:     WBC 2.3 (*)     RBC 2.24 (*)     Hemoglobin 8.9 (*)     Hematocrit 25.9 (*)      MCV 115.5 (*)     MCH 39.7 (*)     RDW 18.9 (*)     Platelets 67 (*)     Neutrophils Absolute 1.0 (*)     Macrocytes 1+ (*)     Hypochromia Occasional (*)     Ovalocytes 2+ (*)     Stomatocytes Occasional (*)     Tear Drop Cells Occasional (*)     All other components within normal limits    Narrative:     CALL  [place]  [facility] tel. [phone],  Hematology results called to and read back by RN Beth., 04/01/2014 15:57, by  COLLU   COMPREHENSIVE METABOLIC PANEL - Abnormal; Notable for the following:     Glucose 126 (*)     BUN 33 (*)     CREATININE 1.4 (*)     GFR Non-African American 50 (*)     Total Protein 6.2 (*)     Alb 2.2 (*)     Albumin/Globulin Ratio 0.6 (*)     Total Bilirubin 1.1 (*)     AST 52 (*)     All other components within normal limits   TROPONIN - Abnormal; Notable for the following:     Troponin 0.05 (*)     All other components within normal limits   BRAIN NATRIURETIC PEPTIDE - Abnormal; Notable for the following:     Pro-BNP 504 (*)     All other components within normal limits   URINE RT REFLEX TO CULTURE         IMAGING:  Imaging studies from the ER have been reviewed in the computerized chart.  Ct Head Wo Contrast    04/01/2014   EXAMINATION: CT OF THE HEAD WITHOUT CONTRAST  04/01/2014 3:41 pm:  TECHNIQUE: CT of the head was performed without the administration of intravenous contrast.  COMPARISON: 10/30/2010  HISTORY: fixed dilated R pupil  FINDINGS: BRAIN/VENTRICLES: There is no acute intracranial hemorrhage, mass effect or midline shift.  No abnormal extra-axial fluid collection.  The gray-white differentiation is maintained without evidence of an acute infarct.  There is no evidence of hydrocephalus. Chronic atrophic and ischemic white matter changes are stable.  ORBITS: The visualized portion of the orbits demonstrate no acute abnormality.  SINUSES: The visualized paranasal sinuses and mastoid air cells demonstrate no acute abnormality.  SOFT TISSUES/SKULL:  No acute abnormality of the  visualized skull or soft tissues.     04/01/2014   IMPRESSION: No acute intracranial abnormality.     Xr Chest Portable    04/01/2014   EXAMINATION: SINGLE VIEW OF THE CHEST  04/01/2014 3:53 pm  COMPARISON: None available.  HISTORY: HoTN, dizzy  Hypotension  FINDINGS: There is bibasilar scarring and/or atelectasis with a probable small left pleural effusion.  The cardiac silhouette is top-normal in size.  There is no pneumothorax.  There is an  old healed left rib fracture.     04/01/2014   IMPRESSION: 1. Probable small left pleural effusion with compressive atelectasis.       ASSESSMENT AND PLAN:  Patient Active Hospital Problem List:   Hypotension (04/01/2014)    Assessment: still a bit low    Plan: hold bp meds for now. Cards asked to see   Non-ischemic cardiomyopathy ([place]) (11/06/2013)    Assessment: Stable    Plan: Continue present orders/plan   Abnormal EKG (04/01/2014)    Assessment: some ekg changes    Plan: discussed with cards on call yest in ER - no workup recommended then. Await cards eval   Visual changes (04/01/2014)    Assessment: fixed dilated R pupil    Plan: could be CN 3 palsy?  Ask neuro to eval. See if further imaging warranted            Hypokalemia - pt placed on sliding scale K for suspected low K levels   Hyponatremia - pt to have maintenance NS ordered for when hyponatremia is suspected    The patient is being placed in inpatient status with the expectation of requiring a hospital stay spanning at least two midnights for care and treatment of the problems noted in the problem list.  This determination is also based on the patients comorbidities and past medical history, the severity and timing of the signs and symptoms upon presentation.    Section for Face-to-Face requirement documentation    This portion of the note is to certify that I have had a face-to-face encounter with this patient. See above documentation for notes on actual encounter.    Please refer to the written order for the DME which  includes;  1. the beneficiary's name,  2. the item of DME ordered,  3. the prescribing practitioner's National Provider Identifier (NPI),  4. the signature of the ordering practitioner and  5. the date of the order.    Per CMS Pub [address] Medicare Program Integrity, [id number]; Issued: 08-06-11; Effective: 09-06-11; Implementation: 09-06-11): A single confirming signature and date is sufficient in a situation where there are several pertinent portions of the medical record.     The electronic signature on this note shall serve as the confirming signature that the beneficiary was evaluated and/or treated for a condition that supports the need for the item(s) of DME ordered.  Please refer to the appropriate sections of the medical record for documentation of the need for the ordered DME.      Any additional notes for the DME order to be attached below this line:      Deniece Portela  04/01/2014

## 2014-04-01 NOTE — ED Notes (Signed)
Patient resting in bed at this time. Denies pain or discomfort at this time. Wife at bedside. Siderails up x 2. Will continue to monitor.    Gae Dry, RN  04/01/14 [phone]

## 2014-04-01 NOTE — Progress Notes (Signed)
Paged Dr. Tasia Catchings regarding elevated D-dimer results. Discussed pt's stable assessment and oxygenation. No new orders rec'd regarding lab results. Also discussed PM dose of lisinopril and whether or not to hold it. MD stated ok to hold tonight's dose and to leave dose at 5mg  for now (since pt usually takes 10mg  in the evening at home).     Jerolyn Center  04/01/2014

## 2014-04-01 NOTE — ED Notes (Signed)
Pt resting in bed no needs at this time. Call light in reach will continue to monitor.     Royal Piedra, RN  04/01/14 [phone]

## 2014-04-01 NOTE — ED Provider Notes (Signed)
[facility]  eMERGENCY dEPARTMENT eNCOUnter        Pt Name: Andrew [name]  MRN: [id number]  [place] April 02, 1940  Date of evaluation: 04/01/2014  Provider: Georjean Mode, PA-C  PCP: Alecia Lemming, DO  ED Attending: No att. providers found    [place]       Chief Complaint   Patient presents with   ??? Hypotension     sent from Cardiac rehab today for  having low BP. States he is at rehab for an abnormal echo       HISTORY OF PRESENT ILLNESS   (Location/Symptom, Timing/Onset, Context/Setting, Quality, Duration, Modifying Factors, Severity)  Note limiting factors.     Andrew [name] is a 74 y.o. male  presents from cardiac rehab with complaints of hypotension and dizziness.  No syncope. He does have a recent history of abnormal echo, but denies any chest pain or shortness of breath at this time.  He does report intermittent blurred vision and that he still states he experiences from his right eye every morning.  He states that the day goes on this resolves itself.  He denies any other visual disturbances, weakness, numbness or abnormal coordination. Patient's wife reports that he has been slightly more confused over the past few weeks as well.  Patient has no known history of dementia or any other neurological conditions including stroke.  He denies any headaches or neck pain.  No known fevers or chills.  No other recent illness.  He has no other complaints or concerns at this time.    Nursing Notes were all reviewed and agreed with or any disagreements were addressed  in the HPI.    REVIEW OF SYSTEMS    (2-9 systems for level 4, 10 or more for level 5)     Review of Systems   Constitutional: Negative for fever, chills and appetite change.   HENT: Negative for congestion and rhinorrhea.    Eyes: Positive for visual disturbance.   Respiratory: Negative for cough, shortness of breath and wheezing.    Cardiovascular: Negative for chest pain.   Gastrointestinal: Negative for nausea, vomiting,  abdominal pain and diarrhea.   Genitourinary: Negative for dysuria, hematuria and difficulty urinating.   Musculoskeletal: Negative for neck pain and neck stiffness.   Skin: Negative for rash.   Neurological: Positive for dizziness. Negative for syncope, weakness, numbness and headaches.       Except as noted above in the ROS, all other systems were reviewed and negative.       PAST MEDICAL HISTORY     Past Medical History   Diagnosis Date   ??? Hypertension    ??? Kidney stones    ??? Alpha 1 antitrypsin deficiency    ??? Thrombocytopenia ([place]) 04/30/2009   ??? BPH (benign prostatic hyperplasia)    ??? 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 Chronic systolic CHF (congestive heart failure) ([place]) 10/18/2013   ??? CAD (coronary artery disease)      Non-obstructive   ??? Compression fracture of lumbar vertebra ([place])      multiple, lumbar         SURGICAL HISTORY       Past Surgical History   Procedure Laterality Date   ??? Av fistula repair  1970's   ??? Colonoscopy  4/12     5y   ??? Cardiac catheterization           CURRENT MEDICATIONS  Previous Medications    AMILORIDE (MIDAMOR) 5 MG TABLET    Take 5 mg by mouth daily    CARVEDILOL (COREG) 6.25 MG TABLET    Take 1 tablet by mouth 2 times daily (with meals)    CHOLECALCIFEROL (VITAMIN D3) 1000 UNITS CAPS    Take by mouth daily     FUROSEMIDE (LASIX) 40 MG TABLET    Take 0.5 tablets by mouth daily    LACTULOSE 20 GM/30ML SOLN    Take 30 mLs by mouth 2 times daily    LISINOPRIL (PRINIVIL;ZESTRIL) 5 MG TABLET    5 mg in AM and 10 mg in PM    MAGNESIUM OXIDE (MAGOX 400) 400 (241.3 MG) MG TABS TABLET    Take 1 tablet by mouth 2 times daily    MILK THISTLE PO    Take  by mouth.    MULTIPLE VITAMINS-MINERALS (CENTRUM SILVER PO)    Take  by mouth daily.    RIFAXIMIN (XIFAXAN) 550 MG TABLET    Take 550 mg by mouth 2 times daily.      SPIRONOLACTONE (ALDACTONE) 50 MG TABLET    TAKE ONE TABLET BY MOUTH ONCE DAILY    TRAMADOL (ULTRAM) 50 MG TABLET    Take 1 tablet by mouth every 12  hours as needed for Pain         ALLERGIES     Review of patient's allergies indicates no known allergies.    FAMILY HISTORY       Family History   Problem Relation Age of Onset   ??? Diabetes Mother    ??? Diabetes Sister    ??? Diabetes Brother           SOCIAL HISTORY       History     Social History   ??? Marital Status: Married     Spouse Name: N/A     Number of Children: N/A   ??? Years of Education: N/A     Social History Main Topics   ??? Smoking status: Never Smoker    ??? Smokeless tobacco: Never Used      Comment: Passive smoke exposure: yes   ??? Alcohol Use: No   ??? Drug Use: No   ??? Sexual Activity: None     Other Topics Concern   ??? None     Social History Narrative       SCREENINGS             PHYSICAL EXAM    (up to 7 for level 4, 8 or more for level 5)   ED Triage Vitals   BP Temp Temp Source Pulse Resp SpO2 Height Weight   04/01/14 1428 04/01/14 1428 04/01/14 1428 04/01/14 1428 04/01/14 1428 04/01/14 1428 04/01/14 1428 04/01/14 1428   93/53 mmHg 97.5 ??F (36.4 ??C) Oral 56 20 96 % 5\' 5"  (1.651 m) 191 lb (86.637 kg)       Physical Exam   Constitutional: He is oriented to person, place, and time. He appears well-developed and well-nourished.   HENT:   Head: Normocephalic and atraumatic.   Right Ear: External ear normal.   Left Ear: External ear normal.   Nose: Nose normal.   Eyes: Conjunctivae and EOM are normal. Right eye exhibits no discharge. Left eye exhibits no discharge. Right pupil is not reactive. Pupils are unequal.   Right pupil is fixed and dilated.   Neck: Normal range of motion. Neck supple.   Cardiovascular: Regular rhythm and  normal heart sounds.  Bradycardia present.  Exam reveals no gallop and no friction rub.    No murmur heard.  Pulmonary/Chest: Effort normal. No respiratory distress. He has no wheezes. He has no rales.   Abdominal: Soft. He exhibits no distension. There is no tenderness.   Musculoskeletal: Normal range of motion.   Neurological: He is alert and oriented to person, place, and time. No  cranial nerve deficit. Coordination normal.   Skin: Skin is warm and dry. He is not diaphoretic.   Psychiatric: He has a normal mood and affect. His behavior is normal. Judgment and thought content normal.   Nursing note and vitals reviewed.      DIAGNOSTIC RESULTS   LABS:    Labs Reviewed   CBC WITH AUTO DIFFERENTIAL - Abnormal; Notable for the following:     WBC 2.3 (*)     RBC 2.24 (*)     Hemoglobin 8.9 (*)     Hematocrit 25.9 (*)     MCV 115.5 (*)     MCH 39.7 (*)     RDW 18.9 (*)     Platelets 67 (*)     Neutrophils Absolute 1.0 (*)     Macrocytes 1+ (*)     Hypochromia Occasional (*)     Ovalocytes 2+ (*)     Stomatocytes Occasional (*)     Tear Drop Cells Occasional (*)     All other components within normal limits    Narrative:     CALL  [place]  [facility] tel. [phone],  Hematology results called to and read back by RN Beth., 04/01/2014 15:57, by  COLLU   COMPREHENSIVE METABOLIC PANEL - Abnormal; Notable for the following:     Glucose 126 (*)     BUN 33 (*)     CREATININE 1.4 (*)     GFR Non-African American 50 (*)     Total Protein 6.2 (*)     Alb 2.2 (*)     Albumin/Globulin Ratio 0.6 (*)     Total Bilirubin 1.1 (*)     AST 52 (*)     All other components within normal limits   TROPONIN - Abnormal; Notable for the following:     Troponin 0.05 (*)     All other components within normal limits   BRAIN NATRIURETIC PEPTIDE - Abnormal; Notable for the following:     Pro-BNP 504 (*)     All other components within normal limits   URINE RT REFLEX TO CULTURE       All other labs were within normal range or not returned as of this dictation.    EKG: All EKG's are interpreted by the Emergency Department Physician who either signs or Co-signs this chart in the absence of a cardiologist.  Please see their note for interpretation of EKG.    Sinus bradycardia with a ventricular rate of 58 bpm PR interval 142 ms QRS duration 98 ms QT/QTc 456/447 ms.  Nonspecific T-wave abnormalities noted without significant ST  depression/elevation appreciated.  Somewhat in appearance to old EKG from 10/03/13.    RADIOLOGY:   Non-plain film images such as CT, Ultrasound and MRI are read by the radiologist. Plain radiographic images are visualized and preliminarily interpreted by the  ED Provider with the below findings:    No acute intracranial abnormalities on head CT.    Interpretation per the Radiologist below, if available at the time of this note:    XR Chest Portable  Final Result   IMPRESSION:   1. Probable small left pleural effusion with compressive atelectasis.         CT Head WO Contrast   Final Result   IMPRESSION:   No acute intracranial abnormality.           No results found.    PROCEDURES   Unless otherwise noted below, none     Procedures    CRITICAL CARE TIME   N/A    CONSULTS:  IP CONSULT TO CARDIOLOGY  IP CONSULT TO OPHTHALMOLOGY  IP CONSULT TO INTERNAL MEDICINE      EMERGENCY DEPARTMENT COURSE and DIFFERENTIAL DIAGNOSIS/MDM:   Vitals:    Filed Vitals:    04/01/14 1746 04/01/14 1801 04/01/14 1816 04/01/14 1821   BP: 100/56 99/58 106/72 106/72   Pulse: 52 56 64 66   Temp:       TempSrc:       Resp: 16 16 16 18    Height:       Weight:       SpO2: 92% 88% 94% 92%       Patient was given the following medications:  Medications   tetracaine (TETRAVISC) 0.5 % ophthalmic solution (  Canceled Entry 04/01/14 1515)   0.9 % sodium chloride bolus (0 mLs Intravenous Stopped 04/01/14 1612)   0.9 % sodium chloride bolus (0 mLs Intravenous Stopped 04/01/14 1818)       Patient presents from cardiac rehab with complaint of hypotension with associated dizziness.  On exam, patient is well-appearing and in no acute distress.  He has no other place including chest pain or shortness of breath.  On initial presentation, patient's blood pressure was 93/53.  He was initially given a liter of fluids.  Due to his history of congestive heart failure, additional fluids were held pending lab results.  CBC and CMP are positive for a leukocytopenia with a  white count of 2.3 with low neutrophils and a mild anemia with hemoglobin of 8.9 and a chronic kidney disease with a creatinine of 1.4.  His BNP was mildly elevated at 504.  Urinalysis was negative.  Troponin was mildly elevated at 0.05.  EKG shows sinus bradycardia with a ventricular rate of 58 bpm.  There were T-wave abnormalities noted.  Chest x-ray showed probable small left pleural effusion.  No thorax or focal infiltrate appreciated.    On exam, it was discovered the patient had fixed right pupil.  The patient also reported intermittent blurred vision in this eye and a "film" sensation ??2 weeks.  His neurological exam was otherwise benign with cranial nerves II through XII intact.  Strength and sensation was equal bilateral to all extremities.  CT of the head showed no acute intracranial abnormality.  At this time, the patient was given an additional liter of fluids due to persistent hypotension.  Attending physician, Dr. Prince Solian, discussed the case with the on-call cardiologist who agrees to follow the patient on an inpatient basis but does not believe there is any acute intervention at this time. Intraocular pressure 13 bilaterally.  Patient has slightly diminished visual acuity on the right side compared to left.  Dr. Prince Solian and spoke with on-call ophthalmologist at [facility] who is rather noncommittal and unwilling to provide guidance community either as far as whether there needed to be acute intervention and if the patient's symptoms tonight or whether it could warrant later follow-up.  Regardless, the patient will be admitted for cardiac workup and rule out given his  persistent hypotension and elevated troponin with mild T-wave abnormalities noted on EKG.  This was discussed with the admitting physician, Dr. Tasia Catchings, who is agreeable to this plan and will resume care of the patient at this time.  The patient was informed of this and is agreeable.  He is stable for admission at this  time.    The patient tolerated their visit well.  They were seen and evaluated by the attending physician, No att. providers found who agreed with the assessment and plan.  The patient and / or the family were informed of the results of any tests, a time was given to answer questions, a plan was proposed and they agreed with plan.        FINAL IMPRESSION      1. Hypotension, unspecified hypotension type    2. Fixed pupil of right eye    3. Blurred vision, right eye    4. Pleural effusion          DISPOSITION/PLAN   DISPOSITION     PATIENT REFERRED TO:  Alecia Lemming, DO  [place] [address]  [phone]      For a re-check following discharge       DISCHARGE MEDICATIONS:  New Prescriptions    No medications on file       DISCONTINUED MEDICATIONS:  Discontinued Medications    No medications on file              (Please note that portions of this note were completed with a voice recognition program.  Efforts were made to edit the dictations but occasionally words are mis-transcribed.)    Georjean Mode, PA-C (electronically signed)            Posey Rea, PA-C  04/01/14 [phone]

## 2014-04-01 NOTE — ED Notes (Signed)
Report called to CVU RN.     Gae Dry, RN  04/01/14 2026

## 2014-04-01 NOTE — Telephone Encounter (Signed)
Received call from [place], RN at cardiac rehab. Patient there today and experiencing symptomatic hypotension. BP 82/46 and patient is falling asleep on exercise equipment. He had fallen today at home per patient. She is concerned about him driving home. Discussed with Dr. Jacqualyn Posey, instructed to be seen in ED. Relayed to cardiac rehab. States understanding.

## 2014-04-02 LAB — LIPID PANEL
Cholesterol, Total: 101 mg/dL (ref 0–199)
HDL: 22 mg/dL — ABNORMAL LOW (ref 40–60)
LDL Calculated: 62 mg/dL (ref <100)
Triglycerides: 83 mg/dL (ref 0–150)
VLDL Cholesterol Calculated: 17 mg/dL

## 2014-04-02 LAB — BASIC METABOLIC PANEL
Anion Gap: 5 (ref 3–16)
BUN: 29 mg/dL — ABNORMAL HIGH (ref 7–20)
CO2: 28 mmol/L (ref 21–32)
Calcium: 8.3 mg/dL (ref 8.3–10.6)
Chloride: 107 mmol/L (ref 99–110)
Creatinine: 1.2 mg/dL (ref 0.8–1.3)
GFR African American: >60 (ref >60)
GFR Non-African American: 60 — AB (ref >60)
Glucose: 159 mg/dL — ABNORMAL HIGH (ref 70–99)
Potassium: 4.9 mmol/L (ref 3.5–5.1)
Sodium: 140 mmol/L (ref 136–145)

## 2014-04-02 LAB — CBC WITH AUTO DIFFERENTIAL
Basophils %: 0.6 %
Basophils Absolute: 0 10*3/uL (ref 0.0–0.2)
Eosinophils %: 1.9 %
Eosinophils Absolute: 0 10*3/uL (ref 0.0–0.6)
Hematocrit: 23.9 % — ABNORMAL LOW (ref 40.5–52.5)
Hemoglobin: 8.1 g/dL — ABNORMAL LOW (ref 13.5–17.5)
Lymphocytes %: 57.7 %
Lymphocytes Absolute: 1.1 10*3/uL (ref 1.0–5.1)
MCH: 39.5 pg — ABNORMAL HIGH (ref 26.0–34.0)
MCHC: 34 g/dL (ref 31.0–36.0)
MCV: 116.1 fL — ABNORMAL HIGH (ref 80.0–100.0)
MPV: 9.4 fL (ref 5.0–10.5)
Monocytes %: 7.7 %
Monocytes Absolute: 0.2 10*3/uL (ref 0.0–1.3)
Neutrophils %: 32.1 %
Neutrophils Absolute: 0.6 10*3/uL — CL (ref 1.7–7.7)
Platelets: 58 10*3/uL — ABNORMAL LOW (ref 135–450)
RBC: 2.06 M/uL — ABNORMAL LOW (ref 4.20–5.90)
RDW: 18.9 % — ABNORMAL HIGH (ref 12.4–15.4)
WBC: 2 10*3/uL — ABNORMAL LOW (ref 4.0–11.0)

## 2014-04-02 LAB — EKG 12-LEAD
Atrial Rate: 58 {beats}/min
Atrial Rate: 59 {beats}/min
P Axis: 17 degrees
P Axis: 21 degrees
P-R Interval: 142 ms
P-R Interval: 154 ms
Q-T Interval: 452 ms
Q-T Interval: 456 ms
QRS Duration: 104 ms
QRS Duration: 98 ms
QTc Calculation (Bazett): 447 ms
QTc Calculation (Bazett): 447 ms
R Axis: -18 degrees
R Axis: 1 degrees
T Axis: -47 degrees
T Axis: -51 degrees
Ventricular Rate: 58 {beats}/min
Ventricular Rate: 59 {beats}/min

## 2014-04-02 LAB — PERIPHERAL BLOOD SMEAR, PATH REVIEW

## 2014-04-02 LAB — D-DIMER, QUANTITATIVE: D-Dimer, Quant: 1755 ng/mL DDU — ABNORMAL HIGH (ref 0–229)

## 2014-04-02 LAB — ECHOCARDIOGRAM LIMITED: Left Ventricular Ejection Fraction: 50

## 2014-04-02 LAB — PROTIME-INR
INR: 1.4 — ABNORMAL HIGH (ref 0.85–1.16)
Protime: 16.1 s — ABNORMAL HIGH (ref 9.8–13.0)

## 2014-04-02 LAB — TROPONIN: Troponin: 0.03 ng/mL — ABNORMAL HIGH (ref <0.01)

## 2014-04-02 MED ORDER — TRAMADOL HCL 50 MG PO TABS
50 MG | Freq: Two times a day (BID) | ORAL | Status: DC | PRN
Start: 2014-04-02 — End: 2014-04-03

## 2014-04-02 MED ORDER — HYDROCODONE-ACETAMINOPHEN 5-325 MG PO TABS
5-325 MG | ORAL | Status: DC | PRN
Start: 2014-04-02 — End: 2014-04-03

## 2014-04-02 MED ORDER — SODIUM CHLORIDE 0.9 % IV BOLUS
0.9 % | INTRAVENOUS | Status: DC | PRN
Start: 2014-04-02 — End: 2014-04-03

## 2014-04-02 MED ORDER — ONDANSETRON HCL 4 MG/2ML IJ SOLN
4 MG/2ML | Freq: Four times a day (QID) | INTRAMUSCULAR | Status: DC | PRN
Start: 2014-04-02 — End: 2014-04-03

## 2014-04-02 MED ORDER — POTASSIUM CHLORIDE CRYS ER 20 MEQ PO TBCR
20 MEQ | ORAL | Status: DC | PRN
Start: 2014-04-02 — End: 2014-04-03

## 2014-04-02 MED ORDER — FUROSEMIDE 20 MG PO TABS
20 MG | Freq: Every day | ORAL | Status: DC
Start: 2014-04-02 — End: 2014-04-02

## 2014-04-02 MED ORDER — LACTULOSE 20 GM/30ML PO SOLN
20 GM/30ML | Freq: Two times a day (BID) | ORAL | Status: DC
Start: 2014-04-02 — End: 2014-04-01

## 2014-04-02 MED ORDER — CARVEDILOL 6.25 MG PO TABS
6.25 MG | Freq: Two times a day (BID) | ORAL | Status: DC
Start: 2014-04-02 — End: 2014-04-03
  Administered 2014-04-03: 21:00:00 6.25 mg via ORAL

## 2014-04-02 MED ORDER — POTASSIUM CHLORIDE 10 MEQ/100ML IV SOLN
10 MEQ/0ML | INTRAVENOUS | Status: DC | PRN
Start: 2014-04-02 — End: 2014-04-01

## 2014-04-02 MED ORDER — RIFAXIMIN 550 MG PO TABS
550 MG | Freq: Two times a day (BID) | ORAL | Status: DC
Start: 2014-04-02 — End: 2014-04-03
  Administered 2014-04-02 – 2014-04-03 (×4): 550 mg via ORAL

## 2014-04-02 MED ORDER — POTASSIUM CHLORIDE CRYS ER 20 MEQ PO TBCR
20 MEQ | ORAL | Status: DC | PRN
Start: 2014-04-02 — End: 2014-04-01

## 2014-04-02 MED ORDER — SPIRONOLACTONE 50 MG PO TABS
50 MG | Freq: Every day | ORAL | Status: DC
Start: 2014-04-02 — End: 2014-04-01

## 2014-04-02 MED ORDER — LACTULOSE 10 GM/15ML PO SOLN
10 GM/15ML | Freq: Two times a day (BID) | ORAL | Status: DC
Start: 2014-04-02 — End: 2014-04-02
  Administered 2014-04-02 (×2): 20 g via ORAL

## 2014-04-02 MED ORDER — LACTULOSE 10 GM/15ML PO SOLN
10 GM/15ML | Freq: Three times a day (TID) | ORAL | Status: DC
Start: 2014-04-02 — End: 2014-04-03
  Administered 2014-04-03 (×3): 20 g via ORAL

## 2014-04-02 MED ORDER — SPIRONOLACTONE 25 MG PO TABS
25 MG | Freq: Every day | ORAL | Status: DC
Start: 2014-04-02 — End: 2014-04-02

## 2014-04-02 MED ORDER — POTASSIUM CHLORIDE 10 MEQ/100ML IV SOLN
10 MEQ/0ML | INTRAVENOUS | Status: DC | PRN
Start: 2014-04-02 — End: 2014-04-03

## 2014-04-02 MED ORDER — NORMAL SALINE FLUSH 0.9 % IV SOLN
0.9 % | INTRAVENOUS | Status: DC | PRN
Start: 2014-04-02 — End: 2014-04-03

## 2014-04-02 MED ORDER — NITROGLYCERIN 0.4 MG SL SUBL
0.4 MG | SUBLINGUAL | Status: DC | PRN
Start: 2014-04-02 — End: 2014-04-03

## 2014-04-02 MED ORDER — POTASSIUM CHLORIDE 20 MEQ/15ML (10%) PO SOLN
20 MEQ/15ML (10%) | ORAL | Status: DC | PRN
Start: 2014-04-02 — End: 2014-04-03

## 2014-04-02 MED ORDER — MORPHINE SULFATE (PF) 2 MG/ML IV SOLN
2 MG/ML | INTRAVENOUS | Status: DC | PRN
Start: 2014-04-02 — End: 2014-04-03

## 2014-04-02 MED ORDER — ACETAMINOPHEN 325 MG PO TABS
325 MG | ORAL | Status: DC | PRN
Start: 2014-04-02 — End: 2014-04-03

## 2014-04-02 MED ORDER — POTASSIUM CHLORIDE 20 MEQ/15ML (10%) PO SOLN
20 MEQ/15ML (10%) | ORAL | Status: DC | PRN
Start: 2014-04-02 — End: 2014-04-01

## 2014-04-02 MED ORDER — MAGNESIUM OXIDE 400 (241.3 MG) MG PO TABS
400 (241.3 Mg) MG | Freq: Two times a day (BID) | ORAL | Status: DC
Start: 2014-04-02 — End: 2014-04-03
  Administered 2014-04-02 – 2014-04-03 (×4): 400 mg via ORAL

## 2014-04-02 MED ORDER — ASPIRIN 325 MG PO TBEC
325 MG | Freq: Every day | ORAL | Status: DC
Start: 2014-04-02 — End: 2014-04-03
  Administered 2014-04-02 – 2014-04-03 (×2): 325 mg via ORAL

## 2014-04-02 MED ORDER — ENOXAPARIN SODIUM 40 MG/0.4ML SC SOLN
40 MG/0.4ML | Freq: Every day | SUBCUTANEOUS | Status: DC
Start: 2014-04-02 — End: 2014-04-03
  Administered 2014-04-02 – 2014-04-03 (×2): 40 mg via SUBCUTANEOUS

## 2014-04-02 MED ORDER — NORMAL SALINE FLUSH 0.9 % IV SOLN
0.9 % | Freq: Two times a day (BID) | INTRAVENOUS | Status: DC
Start: 2014-04-02 — End: 2014-04-03
  Administered 2014-04-02 – 2014-04-03 (×4): 10 mL via INTRAVENOUS

## 2014-04-02 MED ORDER — MORPHINE SULFATE (PF) 4 MG/ML IV SOLN
4 MG/ML | INTRAVENOUS | Status: DC | PRN
Start: 2014-04-02 — End: 2014-04-03

## 2014-04-02 MED ORDER — LISINOPRIL 5 MG PO TABS
5 MG | Freq: Two times a day (BID) | ORAL | Status: DC
Start: 2014-04-02 — End: 2014-04-02

## 2014-04-02 MED ORDER — MAGNESIUM HYDROXIDE 400 MG/5ML PO SUSP
400 MG/5ML | Freq: Every day | ORAL | Status: DC | PRN
Start: 2014-04-02 — End: 2014-04-03

## 2014-04-02 MED FILL — NORMAL SALINE FLUSH 0.9 % IV SOLN: 0.9 % | INTRAVENOUS | Qty: 10

## 2014-04-02 MED FILL — ASPIRIN EC 325 MG PO TBEC: 325 MG | ORAL | Qty: 1

## 2014-04-02 MED FILL — LACTULOSE 10 GM/15ML PO SOLN: 10 GM/15ML | ORAL | Qty: 30

## 2014-04-02 MED FILL — LISINOPRIL 5 MG PO TABS: 5 MG | ORAL | Qty: 1

## 2014-04-02 MED FILL — XIFAXAN 550 MG PO TABS: 550 MG | ORAL | Qty: 1

## 2014-04-02 MED FILL — FUROSEMIDE 20 MG PO TABS: 20 MG | ORAL | Qty: 1

## 2014-04-02 MED FILL — NORMAL SALINE FLUSH 0.9 % IV SOLN: 0.9 % | INTRAVENOUS | Qty: 30

## 2014-04-02 MED FILL — SPIRONOLACTONE 25 MG PO TABS: 25 MG | ORAL | Qty: 1

## 2014-04-02 MED FILL — CARVEDILOL 6.25 MG PO TABS: 6.25 MG | ORAL | Qty: 1

## 2014-04-02 MED FILL — MAGNESIUM OXIDE 400 (241.3 MG) MG PO TABS: 400 (241.3 Mg) MG | ORAL | Qty: 1

## 2014-04-02 MED FILL — LOVENOX 40 MG/0.4ML SC SOLN: 40 MG/0.4ML | SUBCUTANEOUS | Qty: 0.4

## 2014-04-02 NOTE — Plan of Care (Signed)
Problem: Falls - Risk of  Goal: Absence of falls  Outcome: Ongoing  Fall risk precautions in place, Fall risk assessment protocol followed. Bed in lowest position with wheels locked, SR up X2, bed alarm in place and activated, orange blanket on bed, non-skid socks on pt, fall risk ID on pt arm, call light in reach, pt encouraged to call before getting out of bed and for any other needs or complaints. Gait steady, No falls this shift. Will continue to monitor S.Lurleen Soltero RN          Problem: Pain:  Goal: Patient???s pain/discomfort is manageable  Pain level assessment complete. Pt denied pain t/o shift. Pt educated on pain scale and control interventions. PRN pain medication given per pt request. Pt VU to call out c new onset of pain or unrelieved pain. Will continue to monitor. Gwynne Edinger RN        Problem: Skin Integrity:  Goal: Skin integrity will stabilize  Outcome: Ongoing  Pt at risk for skin breakdown. See Braden scale. Pt up w/ SBA. Skin assessment complete. No open areas or lesions noted. Pressure areas intact, blanchable. Pt assisted to turn and reposition every two hours and as needed. Heels elevated off bed. Protective barrier placed as needed. Patient kept clean and dry, peri-care as needed. Pillows used for positioning. Will continue to monitor for skin breakdown. S.Bonna Steury RN

## 2014-04-02 NOTE — Consults (Signed)
In patient Neurology consult        [facility]      Dellis Filbert, MD      Glenn Gullickson  September 14, 1940    Date of Service: 04/02/2014    Referring Physician: Deniece Portela, MD      Reason for the consult: MS changes, visual changes and dizziness.     HPI:   The patient is a 74 years old man who was admitted yesterday from his cardiac rehab with acute dizziness, headache and presyncope.  He was at his cardiac rehab when he had a sudden onset of dizziness, headache and blurred vision from his right eye.  Staff checked his blood pressure and was found to be low at least below 100.  Symptoms persisted and he was transferred to [facility] where he was admitted for further evaluation.  CT head in the ED was nonacute.  Upon initial evaluation, there was some concern of right visual field deficit and pupillary changes.  Today the patient feels back to his baseline.  He denies any new symptoms.  He was started on aspirin yesterday.  He was not taking aspirin at home.  He denies any prior history of TIA or strokes.  Denies any speech or swallowing issues or new weakness.  He had some recent changes in his blood pressure medications. Cardiology has seen the patient. No recent CUS.  Other ROS was negative.         Past Medical History   Diagnosis Date   ??? Hypertension    ??? Kidney stones    ??? Alpha 1 antitrypsin deficiency    ??? Thrombocytopenia ([place]) 04/30/2009   ??? BPH (benign prostatic hyperplasia)    ??? 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 Chronic systolic CHF (congestive heart failure) ([place]) 10/18/2013   ??? CAD (coronary artery disease)      Non-obstructive   ??? Compression fracture of lumbar vertebra ([facility])      multiple, lumbar     Family History   Problem Relation Age of Onset   ??? Diabetes Mother    ??? Diabetes Sister    ??? Diabetes Brother      Past Surgical History   Procedure Laterality Date   ??? Av fistula repair  1970's   ??? Colonoscopy  4/12     5y   ??? Cardiac catheterization       Past Surgical  History   Procedure Laterality Date   ??? Av fistula repair  1970's   ??? Colonoscopy  4/12     5y   ??? Cardiac catheterization       Family History   Problem Relation Age of Onset   ??? Diabetes Mother    ??? Diabetes Sister    ??? Diabetes Brother      History   Substance Use Topics   ??? Smoking status: Never Smoker    ??? Smokeless tobacco: Never Used      Comment: Passive smoke exposure: yes   ??? Alcohol Use: No     No Known Allergies  Current Facility-Administered Medications   Medication Dose Route Frequency Provider Last Rate Last Dose   ??? rifaximin (XIFAXAN) tablet 550 mg  550 mg Oral BID Deniece Portela, MD   550 mg at 04/01/14 2223   ??? magnesium oxide (MAG-OX) tablet 400 mg  400 mg Oral BID Deniece Portela, MD   400 mg at 04/01/14 2223   ???  traMADol (ULTRAM) tablet 50 mg  50 mg Oral Q12H PRN Deniece Portela, MD       ??? carvedilol (COREG) tablet 6.25 mg  6.25 mg Oral BID WC Deniece Portela, MD       ??? potassium chloride SA (K-DUR;KLOR-CON M) tablet 40 mEq  40 mEq Oral PRN Deniece Portela, MD        Or   ??? potassium chloride (KAYCIEL) 20 MEQ/15ML (10%) oral solution 40 mEq  40 mEq Oral PRN Deniece Portela, MD       ??? 0.9 % sodium chloride bolus  500 mL Intravenous PRN Deniece Portela, MD       ??? sodium chloride flush 0.9 % injection 10 mL  10 mL Intravenous 2 times per day Deniece Portela, MD   10 mL at 04/01/14 2223   ??? sodium chloride flush 0.9 % injection 10 mL  10 mL Intravenous PRN Deniece Portela, MD       ??? potassium chloride 10 mEq/100 mL IVPB (Peripheral Line)  10 mEq Intravenous PRN Deniece Portela, MD       ??? acetaminophen (TYLENOL) tablet 650 mg  650 mg Oral Q4H PRN Deniece Portela, MD       ??? HYDROcodone-acetaminophen [facility]) 5-325 MG per tablet 1 tablet  1 tablet Oral Q4H PRN Deniece Portela, MD        Or   ??? HYDROcodone-acetaminophen [facility]) 5-325 MG per tablet 2 tablet  2 tablet Oral Q4H PRN Deniece Portela, MD       ??? morphine (PF) injection 2 mg  2 mg Intravenous Q2H PRN Deniece Portela, MD        Or   ???  morphine (PF) injection 4 mg  4 mg Intravenous Q2H PRN Deniece Portela, MD       ??? magnesium hydroxide (MILK OF MAGNESIA) 400 MG/5ML suspension 30 mL  30 mL Oral Daily PRN Deniece Portela, MD       ??? ondansetron [facility]) injection 4 mg  4 mg Intravenous Q6H PRN Deniece Portela, MD       ??? aspirin EC tablet 325 mg  325 mg Oral Daily Deniece Portela, MD       ??? enoxaparin (LOVENOX) injection 40 mg  40 mg Subcutaneous Daily Deniece Portela, MD       ??? nitroGLYCERIN (NITROSTAT) SL tablet 0.4 mg  0.4 mg Sublingual Q5 Min PRN Deniece Portela, MD       ??? lactulose (CHRONULAC) 10 GM/15ML solution 20 g  20 g Oral BID Deniece Portela, MD   20 g at 04/01/14 2223       ROS: 14 points were reviewed with the patient, otherwise per my HPI.     Exam:   Filed Vitals:    04/02/14 0500   BP: 78/40   Pulse: 68   Temp:    Resp: 18   SpO2:      Constitutional: Weight: well-nourished.   Head: Size/Trauma: normocephalic  Neck: supple and no carotid bruits.   Heart: S1S2 Regular  Mental Status: Oriented to person, place, problem, and time.    Cranial Nerves:  2-12 wnl.   Motor Exam: 5/5 in all 4 extremities.   Reflexes: Bilateral biceps 2/4, triceps 2/4, brachial radialis 2/4, knee 2/4 and ankle 2/4.   Planters: flexor bilaterally.  Coordination: no pronator drift, no dysmetria. Finger nose finger testing within normal limits.  Sensation:  normal to all modalities.  Gait/Posture: NT    Data:  LABS:   Lab Results   Component Value Date    NA 140 04/02/2014    K 4.9 04/02/2014    CL 107 04/02/2014    CO2 28 04/02/2014    BUN 29 04/02/2014    CREATININE 1.2 04/02/2014    GFRAA >60 04/02/2014    GFRAA >60 05/14/2011    LABGLOM 60 04/02/2014    GLUCOSE 159 04/02/2014    PHOS 2.9 04/06/2010    MG 1.50 01/14/2014    CALCIUM 8.3 04/02/2014     Lab Results   Component Value Date    WBC 2.0 04/02/2014    RBC 2.06 04/02/2014    HGB 8.1 04/02/2014    HCT 23.9 04/02/2014    MCV 116.1 04/02/2014    RDW 18.9 04/02/2014    PLT 58 04/02/2014     Lab Results    Component Value Date    INR 1.30* 01/10/2014    PROTIME 14.9* 01/10/2014     Neuroimaging and labs reviewed by me and discussed results with the patient.    Impression:  Acute dizziness, presyncope and visual disturbance seems to be resolving.  This occurred in a setting of hypotension.  Possible hypoperfusion insults or TIA.  His eye exam patient today was unremarkable.  No anisocoria or visual changes.  CAD  Hypertension  Hypotension  Dehydration  Hyperlipidemia     Recommendation:  ASA  Statin  CUS  BP monitor  Hydration  Avoid BP below 120/80  Secondary stroke prevention was discussed with the patient   Will follow    Thank you for referring such patient. If you have any questions regarding my consult note, please don't hesitate to call me.     Dellis Filbert, MD  [phone]    This dictation was generated by voice recognition computer software. Although all attempts are made to edit the dictation for accuracy, there may be errors in the  transcription that are not intended

## 2014-04-02 NOTE — Consults (Signed)
Andrew [name]   Cardiac Evaluation      [name]  Date of Birth:  January 19, 1941    Requesting Physician:  Dr. Tasia Catchings      Chief Complaint   Patient presents with   ??? Hypotension     sent from Cardiac rehab today for  having low BP. States he is at rehab for an abnormal echo        History of Present Illness:  74 y.o. male presents from cardiac rehab with complaints of hypotension and dizziness. 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  Most recent lvef improved to 45%.  No syncope.He does report intermittent blurred vision and that he still states he experiences from his right eye every morning. He states that the day goes on this resolves itself. He denies any other visual disturbances, weakness, numbness or abnormal coordination. Patient's wife reports that he has been slightly more confused over the past few weeks as well. Patient has no known history of dementia or any other neurological conditions including stroke. He denies any headaches or neck pain. No known fevers or chills. No other recent illness. He has no other complaints or concerns at this time.he is very good about fluid restriction and sodium restriction.  Blood pressure low for a few days.  Labs look dehydrated.    Pt states his normal eye check 3 mos ago was OK. Was not told of fixed R pupil at that time.    Pt denies fevers, denies chills. Pt denies chest pain, denies SOB, denies nausea, denies vomiting, denies headache.     No Known Allergies  Current Facility-Administered Medications   Medication Dose Route Frequency Provider Last Rate Last Dose   ??? rifaximin (XIFAXAN) tablet 550 mg  550 mg Oral BID Deniece Portela, MD   550 mg at 04/01/14 2223   ??? magnesium oxide (MAG-OX) tablet 400 mg  400 mg Oral BID Deniece Portela, MD   400 mg at 04/01/14 2223   ??? traMADol (ULTRAM) tablet 50 mg  50 mg Oral Q12H PRN Deniece Portela, MD       ??? carvedilol (COREG) tablet 6.25 mg  6.25 mg Oral BID WC Deniece Portela, MD       ??? potassium chloride SA (K-DUR;KLOR-CON M) tablet 40 mEq  40 mEq Oral PRN Deniece Portela, MD        Or   ??? potassium chloride (KAYCIEL) 20 MEQ/15ML (10%) oral solution 40 mEq  40 mEq Oral PRN Deniece Portela, MD       ??? 0.9 % sodium chloride bolus  500 mL Intravenous PRN Deniece Portela, MD       ??? sodium chloride flush 0.9 % injection 10 mL  10 mL Intravenous 2 times per day Deniece Portela, MD   10 mL at 04/01/14 2223   ??? sodium chloride flush 0.9 % injection 10 mL  10 mL Intravenous PRN Deniece Portela, MD       ??? potassium chloride 10 mEq/100 mL IVPB (Peripheral Line)  10 mEq Intravenous PRN Deniece Portela, MD       ??? acetaminophen (TYLENOL) tablet 650 mg  650 mg Oral Q4H PRN Deniece Portela, MD       ??? HYDROcodone-acetaminophen [facility]) 5-325 MG per tablet 1 tablet  1 tablet Oral Q4H PRN Deniece Portela, MD        Or   ???  HYDROcodone-acetaminophen (NORCO) 5-325 MG per tablet 2 tablet  2 tablet Oral Q4H PRN Deniece Portela, MD       ??? morphine (PF) injection 2 mg  2 mg Intravenous Q2H PRN Deniece Portela, MD        Or   ??? morphine (PF) injection 4 mg  4 mg Intravenous Q2H PRN Deniece Portela, MD       ??? magnesium hydroxide (MILK OF MAGNESIA) 400 MG/5ML suspension 30 mL  30 mL Oral Daily PRN Deniece Portela, MD       ??? ondansetron [facility]) injection 4 mg  4 mg Intravenous Q6H PRN Deniece Portela, MD       ??? aspirin EC tablet 325 mg  325 mg Oral Daily Deniece Portela, MD       ??? enoxaparin (LOVENOX) injection 40 mg  40 mg Subcutaneous Daily Deniece Portela, MD       ??? nitroGLYCERIN (NITROSTAT) SL tablet 0.4 mg  0.4 mg Sublingual Q5 Min PRN Deniece Portela, MD       ??? lactulose (CHRONULAC) 10 GM/15ML solution 20 g  20 g Oral BID Deniece Portela, MD   20 g at 04/01/14 2223       Past Medical History   Diagnosis Date   ??? Hypertension    ??? Kidney stones    ??? Alpha 1 antitrypsin deficiency    ??? Thrombocytopenia ([place]) 04/30/2009   ??? BPH (benign prostatic hyperplasia)    ??? 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 Chronic systolic CHF (congestive heart failure) ([place]) 10/18/2013   ??? CAD (coronary artery disease)      Non-obstructive   ??? Compression fracture of lumbar vertebra ([place])      multiple, lumbar     Past Surgical History   Procedure Laterality Date   ??? Av fistula repair  1970's   ??? Colonoscopy  4/12     5y   ??? Cardiac catheterization       Family History   Problem Relation Age of Onset   ??? Diabetes Mother    ??? Diabetes Sister    ??? Diabetes Brother      History     Social History   ??? Marital Status: Married     Spouse Name: N/A     Number of Children: N/A   ??? Years of Education: N/A     Occupational History   ??? Not on file.     Social History Main Topics   ??? Smoking status: Never Smoker    ??? Smokeless tobacco: Never Used      Comment: Passive smoke exposure: yes   ??? Alcohol Use: No   ??? Drug Use: No   ??? Sexual Activity: Not on file     Other Topics Concern   ??? Not on file     Social History Narrative       Review of Systems:   ?? Constitutional: there has been no unanticipated weight loss. There's been no change in energy level, sleep pattern, or activity level.     ?? Eyes: No visual changes or diplopia. No scleral icterus.  ?? ENT: No Headaches, hearing loss or vertigo. No mouth sores or sore throat.  ?? Cardiovascular: Reviewed in HPI  ?? Respiratory: No cough or wheezing, no sputum production. No hematemesis.    ?? Gastrointestinal: No abdominal pain, appetite loss, blood in stools. No change in  bowel or bladder habits.  ?? Genitourinary: No dysuria, trouble voiding, or hematuria.  ?? Musculoskeletal:  No gait disturbance, weakness or joint complaints.  ?? Integumentary: No rash or pruritis.  ?? Neurological: No headache, diplopia, change in muscle strength, numbness or tingling. No change in gait, balance, coordination, mood, affect, memory, mentation, behavior.  ?? Psychiatric: No anxiety, no depression.  ?? Endocrine: No malaise, fatigue or temperature intolerance. No excessive thirst, fluid intake, or  urination. No tremor.  ?? Hematologic/Lymphatic: No abnormal bruising or bleeding, blood clots or swollen lymph nodes.  ?? Allergic/Immunologic: No nasal congestion or hives.    Physical Examination:    Filed Vitals:    04/02/14 0325 04/02/14 0400 04/02/14 0430 04/02/14 0500   BP: 93/53 79/44 97/58  78/40   Pulse: 64 74  68   Temp: 98.2 ??F (36.8 ??C)      TempSrc: Temporal      Resp: 16 20  18    Height:       Weight: 179 lb 3.7 oz (81.3 kg)      SpO2: 91% 94%       Body mass index is 29.83 kg/(m^2).     Wt Readings from Last 3 Encounters:   04/02/14 179 lb 3.7 oz (81.3 kg)   01/16/14 195 lb (88.451 kg)   01/14/14 193 lb (87.544 kg)     BP Readings from Last 3 Encounters:   04/02/14 78/40   01/16/14 96/58   01/14/14 115/64     Constitutional and General Appearance:   WD/WN in NAD  HEENT:  NC/AT  Skin:  Warm, dry  Respiratory:  ?? Normal excursion and expansion without use of accessory muscles  ?? Resp Auscultation: Normal breath sounds without dullness  Cardiovascular:  ?? The apical impulses not displaced  ?? Heart tones are crisp and normal  ?? Cervical veins are not engorged  ?? The carotid upstroke is normal in amplitude and contour without delay or bruit  ?? JVP less than 8 cm H2O  RRR with nl S1 and S2 without m,r,g  ?? Peripheral pulses are symmetrical and full  ?? There is no clubbing, cyanosis of the extremities.  ?? No edema  ?? Femoral Arteries: 2+ and equal  ?? Pedal Pulses: 2+ and equal   Neck:  ?? JVP less than 8 cm H2O  ?? No thyromegaly  Abdomen:  ?? No masses or tenderness  ?? Liver/Spleen: No Abnormalities Noted  Neurological/Psychiatric:  ?? Alert and oriented in all spheres  ?? Moves all extremities well  ?? Exhibits normal gait balance and coordination  ?? No abnormalities of mood, affect, memory, mentation, or behavior are noted    Lab Results   Component Value Date    NA 140 04/02/2014    NA 138 04/01/2014    NA 139 01/10/2014    K 4.9 04/02/2014    K 4.9 04/01/2014    K 4.5 01/10/2014    BUN 29 04/02/2014    BUN 33  04/01/2014    BUN 25 01/10/2014    CREATININE 1.2 04/02/2014    CREATININE 1.4 04/01/2014    CREATININE 0.8 01/10/2014    GLUCOSE 159 04/02/2014    GLUCOSE 126 04/01/2014     No results found for: BNP  Lab Results   Component Value Date    ALT 27 04/01/2014    ALT 26 01/10/2014    AST 52* 04/01/2014    AST 51* 01/10/2014     Lab Results   Component  Value Date    HGB 8.1 04/02/2014    HGB 8.9 04/01/2014    HCT 23.9 04/02/2014    HCT 25.9 04/01/2014    PLT 58 04/02/2014    PLT 67 04/01/2014     Lab Results   Component Value Date    TRIG 145 12/29/2009    HDL 59 12/29/2009    LDLCALC 84 12/29/2009       Assessment/Plan:    1. Hypotension, unspecified hypotension type :  Due to volume depletion   2. Fixed pupil of right eye    3. Blurred vision, right eye    4. Pleural effusion     5.  Anemia:  Slightly worse    He appears dry to me.  Stop lasix  Stop lisinopril today and reduce dose on discharge  Change spironolactone to eplerenone on discharge (due to side effects of spironolactone)  Have GI see patient due to anemia and liver disease.  Repeat echo today limited to look at LVEF.    I appreciate the opportunity of cooperating in the care of this patient.    Marissa Nestle, M.D., [place]

## 2014-04-02 NOTE — Plan of Care (Signed)
Problem: Falls - Risk of  Goal: Absence of falls  Outcome: Ongoing  Pt remains free of falls. Fall risk protocol in place. Bed locked in lowest position. Call light in reach. Pt instructed to call for assistance, verbalizes understanding. Will continue to monitor. Both bed and chair alarms being used.

## 2014-04-02 NOTE — Progress Notes (Signed)
Pt's 0300 BP: 79/47; pt sleeping soundly. Woke pt @ 0325 to draw AM labs and rechecked BP: 93/53 while awake. Pt remains asymptomatic at rest. Will monitor.    Jerolyn Center  04/02/2014

## 2014-04-02 NOTE — Progress Notes (Signed)
Bedside report received from off-going RN. Vital signs stable and reflective of baseline, BP 99/53. EKG monitoring reviewed and in place, pt in NSR.     Pt A/O X4, denies pain. Assessment completed per flowsheet. No s/s of acute distress noted. Safety precautions in place; call light, telephone and bedside table within reach. Family at bedside, no needs voiced. Will continue to monitor. S.Anaiza Behrens RN

## 2014-04-02 NOTE — Plan of Care (Signed)
Problem: Falls - Risk of  Goal: Absence of falls  Outcome: Ongoing  Pt remains free of falls. Fall risk protocol in place. Bed locked in lowest position. Call light in reach. Pt instructed to call for assistance, verbalizes understanding. Will continue to monitor.        Problem: Safety:  Goal: Free from intentional harm  Outcome: Ongoing  Patient's ability assessed to perform self care and independent activity encouraged according to that ability. Assisted with ADL's as needed. Risk for skin breakdown assessed. Potential discharge needs assessed. Patient and family included in daily care decisions.         Problem: Pain:  Goal: Patient???s pain/discomfort is manageable  Outcome: Ongoing  Pt alert and oriented. Pt able to communicate present pain and use the pain scale appropriately. Nonpharmacological pain reducers and pain medication offered as needed. Will cont to monitor.

## 2014-04-02 NOTE — Other (Addendum)
Patient Acct Nbr:  [id number]  Primary AUTH/CERT:    [place] Name:   Andrew [name] HMO/GE/NEWHEALTH/PREFE/POS  Primary Insurance Plan Name:  [place]  Primary Insurance Group Number:  [id number]  Primary Insurance Plan Type: N  Primary Insurance Policy Number:  [id number]

## 2014-04-02 NOTE — Consults (Signed)
Gastroenterology Consult Note      Patient: Andrew [name]  DOB: 01/20/1941  Acct#:      Date:  04/02/2014      History of Present Illness  Patient is a 74 y.o. Caucasian male admitted with HYPOTENSION who is seen in consult for cirrhosis and ascites.   He has cirrhosis due to NASH and is followed by Dr. Einar Grad. No h/o ascites. No h/o esophageal variceal bleeding. He has small esophageal varices seen on EGD 01/2014. He has h/o hepatic encephalopathy and takes Xifaxan BID. Also takes lactulose mostly BID in order to have 2 BMs daily. He was on lasix 20 mg daily and aldactone 40 mg daily. He had workup for anemia: EGD and colonoscopy in Nov 2015 for anemia. Small esophageal varices no gastric varies, non erosive gastritis. Normal colonoscopy.     He was brought to the ED with hypotension and SOB. He was also slightly more confused per his wife. He had been sleeping more the past few days. He was thought to be hypotensive from volume depletion. He is currently mentating well, at baseline per his wife. He hadn't had a BM for 2-3 days but had a BM this morning. He denies abdominal pain, nausea, vomiting, melena, or hematochezia.     Past Medical History   Diagnosis Date   ??? Hypertension    ??? Kidney stones    ??? Alpha 1 antitrypsin deficiency    ??? Thrombocytopenia ([place]) 04/30/2009   ??? BPH (benign prostatic hyperplasia)    ??? 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 Chronic systolic CHF (congestive heart failure) ([place]) 10/18/2013   ??? CAD (coronary artery disease)      Non-obstructive   ??? Compression fracture of lumbar vertebra ([place])      multiple, lumbar      Past Surgical History   Procedure Laterality Date   ??? Av fistula repair  1970's   ??? Colonoscopy  4/12     5y   ??? Cardiac catheterization        Past Endoscopic History: see hpi    Admission Meds  No current facility-administered medications on file prior to encounter.     Current Outpatient Prescriptions on File Prior to Encounter   Medication Sig  Dispense Refill   ??? spironolactone (ALDACTONE) 50 MG tablet TAKE ONE TABLET BY MOUTH ONCE DAILY 30 tablet 3   ??? carvedilol (COREG) 6.25 MG tablet Take 1 tablet by mouth 2 times daily (with meals) 60 tablet 3   ??? traMADol (ULTRAM) 50 MG tablet Take 1 tablet by mouth every 12 hours as needed for Pain 60 tablet 0   ??? magnesium oxide (MAGOX 400) 400 (241.3 MG) MG TABS tablet Take 1 tablet by mouth 2 times daily 60 tablet 2   ??? lisinopril (PRINIVIL;ZESTRIL) 5 MG tablet 5 mg in AM and 10 mg in PM 90 tablet 3   ??? furosemide (LASIX) 40 MG tablet Take 0.5 tablets by mouth daily 30 tablet 3   ??? lactulose 20 GM/30ML SOLN Take 30 mLs by mouth 2 times daily 5400 mL 1   ??? Cholecalciferol (VITAMIN D3) 1000 UNITS CAPS Take by mouth daily      ??? MILK THISTLE PO Take  by mouth.     ??? rifaximin (XIFAXAN) 550 MG tablet Take 550 mg by mouth 2 times daily.       ??? Multiple Vitamins-Minerals (CENTRUM SILVER PO) Take  by mouth daily.  Allergies  No Known Allergies   Social   History   Substance Use Topics   ??? Smoking status: Never Smoker    ??? Smokeless tobacco: Never Used      Comment: Passive smoke exposure: yes   ??? Alcohol Use: No        Family History   Problem Relation Age of Onset   ??? Diabetes Mother    ??? Diabetes Sister    ??? Diabetes Brother       Review of Systems  Constitutional: negative for fevers, chills, sweats    Ears, nose, mouth, throat, and face: negative for nasal congestion and sore throat   Respiratory: negative for cough and shortness of breath   Cardiovascular: negative for chest pain and dyspnea   Gastrointestinal: see hpi   Genitourinary:negative for dysuria and frequency   Integument/breast: negative for pruritus and rash   Hematologic/lymphatic: negative for bleeding and easy bruising   Musculoskeletal:negative for arthralgias and myalgias   Neurological: negative for dizziness and weakness       Physical Exam  Blood pressure 78/40, pulse 68, temperature 98.2 ??F (36.8 ??C), temperature source Temporal, resp.  rate 18, height 5\' 5"  (1.651 m), weight 179 lb 3.7 oz (81.3 kg), SpO2 94 %.    General appearance: alert, cooperative, no distress, appears stated age  Eyes: Anicteric  Head: Normocephalic, without obvious abnormality  Lungs: clear to auscultation bilaterally, Normal Effort  Heart: regular rate and rhythm, normal S1 and S2, no murmurs or rubs  Abdomen: soft, non-tender. Bowel sounds normal. No masses,  no organomegaly.   Extremities: atraumatic, no cyanosis or edema  Skin: warm and dry, no jaundice  Neuro: Grossly intact, A&OX3, no asterixis  Musculoskeletal: 5/5 grip strength BUE      Data Review:    Recent Labs      04/01/14   1513  04/02/14   0320   WBC  2.3*  2.0*   HGB  8.9*  8.1*   HCT  25.9*  23.9*   MCV  115.5*  116.1*   PLT  67*  58*     Recent Labs      04/01/14   1513  04/02/14   0320   NA  138  140   K  4.9  4.9   CL  105  107   CO2  28  28   BUN  33*  29*   CREATININE  1.4*  1.2     Recent Labs      04/01/14   1513   AST  52*   ALT  27   BILITOT  1.1*   ALKPHOS  92     No results for input(s): LIPASE, AMYLASE in the last 72 hours.  No results for input(s): PROTIME, INR in the last 72 hours.  No results for input(s): PTT in the last 72 hours.  No results for input(s): OCCULTBLD in the last 72 hours.      Assessment:     Principal Problem:    Hypotension  Active Problems:    Alpha-1-antitrypsin deficiency    Cirrhosis, nonalcoholic ([facility])    Chronic systolic CHF (congestive heart failure) ([facility])    Non-ischemic cardiomyopathy ([facility])    Abnormal EKG    Visual changes    Fixed pupil of right eye    Anemia    Cirrhosis - secondary to NASH. Followed by Dr. Einar Grad. No signs of GI bleeding. No obvious ascites on exam. He wife reported some  confusion at home but is now back to baseline.   Macrocytic anemia - no signs of GI bleed. Negative EGD and colonoscopy 2 months ago. No iron, folate, or B12 deficiency back in Nov. Consider heme eval.  Pancytopenia     Recommendations:   - diuretics per cardiology  - continue  xifaxan. Increase lactulose to TID.  - hold off on endoscopy with recent eval. Consider heme eval for pancytopenia.   - f/u with Dr. Einar Grad    Discussed with Dr. Scheryl Darter.   Erasmo Leventhal, PA-C  [facility] [place]    I have personally performed a face to face diagnostic evaluation on this patient.  I have interviewed and examined the patient and I agree with the findings and recommended plan of care.  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      Geannie Risen, MD  [facility] [place]

## 2014-04-03 LAB — CBC WITH AUTO DIFFERENTIAL
Atypical Lymphocytes Relative: 8 % — ABNORMAL HIGH (ref 0–6)
Bands Relative: 1 % (ref 0–7)
Basophils %: 0 %
Basophils Absolute: 0 10*3/uL (ref 0.0–0.2)
Eosinophils %: 2 %
Eosinophils Absolute: 0.1 10*3/uL (ref 0.0–0.6)
Hematocrit: 26 % — ABNORMAL LOW (ref 40.5–52.5)
Hemoglobin: 8.8 g/dL — ABNORMAL LOW (ref 13.5–17.5)
Lymphocytes %: 48 %
Lymphocytes Absolute: 1.5 10*3/uL (ref 1.0–5.1)
MCH: 39.4 pg — ABNORMAL HIGH (ref 26.0–34.0)
MCHC: 33.8 g/dL (ref 31.0–36.0)
MCV: 116.7 fL — ABNORMAL HIGH (ref 80.0–100.0)
MPV: 9.5 fL (ref 5.0–10.5)
Metamyelocytes Relative: 2 % — AB
Monocytes %: 9 %
Monocytes Absolute: 0.2 10*3/uL (ref 0.0–1.3)
Neutrophils %: 30 %
Neutrophils Absolute: 0.9 10*3/uL — CL (ref 1.7–7.7)
PLATELET SLIDE REVIEW: DECREASED
Platelets: 67 10*3/uL — ABNORMAL LOW (ref 135–450)
RBC: 2.23 M/uL — ABNORMAL LOW (ref 4.20–5.90)
RDW: 18.9 % — ABNORMAL HIGH (ref 12.4–15.4)
WBC: 2.6 10*3/uL — ABNORMAL LOW (ref 4.0–11.0)

## 2014-04-03 LAB — BASIC METABOLIC PANEL
Anion Gap: 2 — ABNORMAL LOW (ref 3–16)
BUN: 23 mg/dL — ABNORMAL HIGH (ref 7–20)
CO2: 27 mmol/L (ref 21–32)
Calcium: 8.4 mg/dL (ref 8.3–10.6)
Chloride: 109 mmol/L (ref 99–110)
Creatinine: 1 mg/dL (ref 0.8–1.3)
GFR African American: >60 (ref >60)
GFR Non-African American: >60 (ref >60)
Glucose: 97 mg/dL (ref 70–99)
Potassium: 4.2 mmol/L (ref 3.5–5.1)
Sodium: 138 mmol/L (ref 136–145)

## 2014-04-03 LAB — BRAIN NATRIURETIC PEPTIDE: Pro-BNP: 864 pg/mL — ABNORMAL HIGH (ref 0–124)

## 2014-04-03 MED ORDER — LISINOPRIL 5 MG PO TABS
5 MG | Freq: Every day | ORAL | Status: DC
Start: 2014-04-03 — End: 2014-04-03
  Administered 2014-04-03: 16:00:00 5 mg via ORAL

## 2014-04-03 MED ORDER — LISINOPRIL 5 MG PO TABS
5 MG | ORAL_TABLET | Freq: Every evening | ORAL | Status: DC
Start: 2014-04-03 — End: 2014-05-06

## 2014-04-03 MED ORDER — FUROSEMIDE 40 MG PO TABS
40 MG | ORAL_TABLET | Freq: Every day | ORAL | Status: DC | PRN
Start: 2014-04-03 — End: 2014-04-15

## 2014-04-03 MED FILL — LACTULOSE 10 GM/15ML PO SOLN: 10 GM/15ML | ORAL | Qty: 30

## 2014-04-03 MED FILL — ASPIRIN EC 325 MG PO TBEC: 325 MG | ORAL | Qty: 1

## 2014-04-03 MED FILL — XIFAXAN 550 MG PO TABS: 550 MG | ORAL | Qty: 1

## 2014-04-03 MED FILL — CARVEDILOL 6.25 MG PO TABS: 6.25 MG | ORAL | Qty: 1

## 2014-04-03 MED FILL — MAGNESIUM OXIDE 400 (241.3 MG) MG PO TABS: 400 (241.3 Mg) MG | ORAL | Qty: 1

## 2014-04-03 MED FILL — DOPAMINE-DEXTROSE 1.6-5 MG/ML-% IV SOLN: INTRAVENOUS | Qty: 250

## 2014-04-03 MED FILL — ALBUTEIN 5 % IV SOLN: 5 % | INTRAVENOUS | Qty: 250

## 2014-04-03 MED FILL — POTASSIUM CHLORIDE 2 MEQ/ML IV SOLN: 2 MEQ/ML | INTRAVENOUS | Qty: 20

## 2014-04-03 MED FILL — LISINOPRIL 5 MG PO TABS: 5 MG | ORAL | Qty: 1

## 2014-04-03 MED FILL — NORMAL SALINE FLUSH 0.9 % IV SOLN: 0.9 % | INTRAVENOUS | Qty: 10

## 2014-04-03 MED FILL — LOVENOX 40 MG/0.4ML SC SOLN: 40 MG/0.4ML | SUBCUTANEOUS | Qty: 0.4

## 2014-04-03 NOTE — Progress Notes (Signed)
GI agrees with d/c f/u with Dr. Mignon Pine and cont current home meds.

## 2014-04-03 NOTE — Progress Notes (Signed)
Discharge instructions reviewed with patient and family member.  Patient and family verbalized understanding.  All home medications have been reviewed, questions answered and patient voiced understanding. All medication side effects reviewed and patient and family verbalized understanding. Patient given discharge instructions, and appointment times.  Patient discharged to home with family via private car.  Taken to lobby via wheelchair.     [facility]  Depression Screen    1. During the past month, have you often been bothered by feeling down, depressed, or hopeless?   No    2.  During the past month, have you often been bothered by little interest or pleasure in       doing things?   No

## 2014-04-03 NOTE — Progress Notes (Signed)
Pt assisted up to BR w/ SBA, gait steady. Peri-care provided by pt.     Pt bathed while sitting on bed. Denies pain at this time. VSS, BP 110/61. Pt denies chest pain, SOB, dizziness, and N/V. No s/s of acute distress at present. Labs drawn and sent to lab. Will continue to monitor. S.Nhyira Leano RN

## 2014-04-03 NOTE — Progress Notes (Signed)
Progress Note - Dr. Tasia Catchings - Internal Medicine  PCP: Alecia Lemming, DO [place] / [place] [address] [phone]    Hospital Day: 2  Code Status: Full Code  Current Diet: DIET GENERAL; Low Sodium (2 GM)        CC: follow up on medical issues    Subjective:   Andrew [name] is a 74 y.o. male.    He denies problems    Doing a little better  BP in 110s now that we are holding rx    ECHO done yest  Summary  -Limited bedside 2D echocardiogram was performed to evaluate EF.  -Left ventricular size is mildly increased .  -Left ventricular function is low normal with ejection fraction estimated at  50%.  -There is a question of mild inferior hypokinesis.  -There is mild mitral and trivial pulmonic and tricuspid regurgitation with  RVSP estimated at 27 mmHg.  -Right heart size is borderline dilated .  -Dilated left atrium with a volume of 64ml.      Appreciate neuro eval - awaiting carotid ultrasound    Visual changes better  Pupil still fixed    He denies chest pain, denies shortness of breath, denies nausea,  denies emesis.    10 system Review of Systems is reviewed with patient, and pertinent positives are listed here: None . Otherwise, Review of systems is negative.     I have reviewed the patient's medical and social history in detail and updated the computerized patient record.  To recap: He  has a past medical history of Hypertension; Kidney stones; Alpha 1 antitrypsin deficiency; Thrombocytopenia ([place]); BPH (benign prostatic hyperplasia); Cirrhosis, nonalcoholic ([place]); Chronic systolic CHF (congestive heart failure) ([place]); CAD (coronary artery disease); and Compression fracture of lumbar vertebra ([place]).. He  has past surgical history that includes AV fistula repair (1970's); Colonoscopy (4/12); and Cardiac catheterization.[name] He  reports that he has never smoked. He has never used smokeless tobacco. He reports that he does not drink alcohol or use illicit drugs..        Active Hospital Problems    Diagnosis  Date Noted   ??? 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 Anemia [D64.9] 04/02/2014   ??? 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 CAD in native artery [I25.10]    ??? Hyperlipidemia [E78.5]    ??? Hypotension [I95.9] 04/01/2014   ??? Abnormal EKG [R94.31] 04/01/2014   ??? Visual changes [H53.9] 04/01/2014   ??? 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 Chronic systolic CHF (congestive heart failure) ([place]) [I50.22] 10/18/2013   ??? Cirrhosis, nonalcoholic ([place]) [B14.78]    ??? Alpha-1-antitrypsin deficiency [E88.01] 10/09/2009       Current facility-administered medications: lactulose (CHRONULAC) 10 GM/15ML solution 20 g, 20 g, Oral, TID  rifaximin (XIFAXAN) tablet 550 mg, 550 mg, Oral, BID  magnesium oxide (MAG-OX) tablet 400 mg, 400 mg, Oral, BID  traMADol (ULTRAM) tablet 50 mg, 50 mg, Oral, Q12H PRN  carvedilol (COREG) tablet 6.25 mg, 6.25 mg, Oral, BID WC  potassium chloride SA (K-DUR;KLOR-CON M) tablet 40 mEq, 40 mEq, Oral, PRN **OR** potassium chloride (KAYCIEL) 20 MEQ/15ML (10%) oral solution 40 mEq, 40 mEq, Oral, PRN **OR** [DISCONTINUED] potassium chloride 10 mEq/100 mL IVPB (Peripheral Line), 10 mEq, Intravenous, PRN  0.9 % sodium chloride bolus, 500 mL, Intravenous, PRN  sodium chloride flush 0.9 % injection 10 mL, 10 mL, Intravenous, 2 times per day  sodium chloride flush 0.9 % injection 10  mL, 10 mL, Intravenous, PRN  [DISCONTINUED] potassium chloride SA (K-DUR;KLOR-CON M) tablet 40 mEq, 40 mEq, Oral, PRN **OR** [DISCONTINUED] potassium chloride (KAYCIEL) 20 MEQ/15ML (10%) oral solution 40 mEq, 40 mEq, Oral, PRN **OR** potassium chloride 10 mEq/100 mL IVPB (Peripheral Line), 10 mEq, Intravenous, PRN  acetaminophen (TYLENOL) tablet 650 mg, 650 mg, Oral, Q4H PRN  HYDROcodone-acetaminophen (NORCO) 5-325 MG per tablet 1 tablet, 1 tablet, Oral, Q4H PRN **OR** HYDROcodone-acetaminophen (NORCO) 5-325 MG per tablet 2 tablet, 2 tablet, Oral, Q4H PRN  morphine (PF) injection 2 mg, 2 mg, Intravenous,  Q2H PRN **OR** morphine (PF) injection 4 mg, 4 mg, Intravenous, Q2H PRN  magnesium hydroxide (MILK OF MAGNESIA) 400 MG/5ML suspension 30 mL, 30 mL, Oral, Daily PRN  ondansetron (ZOFRAN) injection 4 mg, 4 mg, Intravenous, Q6H PRN  aspirin EC tablet 325 mg, 325 mg, Oral, Daily  enoxaparin (LOVENOX) injection 40 mg, 40 mg, Subcutaneous, Daily  nitroGLYCERIN (NITROSTAT) SL tablet 0.4 mg, 0.4 mg, Sublingual, Q5 Min PRN         Objective:  BP 110/61 mmHg   Pulse 77   Temp(Src) 98.2 ??F (36.8 ??C) (Temporal)   Resp 18   Ht 5\' 5"  (1.651 m)   Wt 185 lb 13.6 oz (84.3 kg)   BMI 30.93 kg/m2   SpO2 96%     Patient Vitals for the past 24 hrs:   BP Temp Temp src Pulse Resp SpO2 Weight   04/03/14 0600 - - - 77 - - 185 lb 13.6 oz (84.3 kg)   04/03/14 0400 - - - 64 - - -   04/03/14 0325 110/61 mmHg 98.2 ??F (36.8 ??C) Temporal 64 18 96 % -   04/03/14 0200 - - - 67 - - -   04/03/14 0000 - - - 57 - - -   04/02/14 2302 100/53 mmHg 98 ??F (36.7 ??C) Temporal 63 16 97 % -   04/02/14 2200 - - - 61 - - -   04/02/14 2000 - - - 69 - - -   04/02/14 1939 99/53 mmHg 98.2 ??F (36.8 ??C) Temporal 71 18 96 % -   04/02/14 1601 99/46 mmHg 98.1 ??F (36.7 ??C) Temporal 62 18 96 % -   04/02/14 1213 91/55 mmHg - - 66 18 97 % -   04/02/14 1212 (!) 92/38 mmHg - - 67 18 96 % -   04/02/14 1041 99/54 mmHg - - 66 18 96 % -   04/02/14 0810 96/56 mmHg 98.2 ??F (36.8 ??C) Temporal 71 18 95 % -   04/02/14 0800 - - - 72 - - -     Patient Vitals for the past 96 hrs (Last 3 readings):   Weight   04/03/14 0600 185 lb 13.6 oz (84.3 kg)   04/02/14 0325 179 lb 3.7 oz (81.3 kg)   04/01/14 2055 152 lb 5.4 oz (69.1 kg)           Intake/Output Summary (Last 24 hours) at 04/03/14 0749  Last data filed at 04/03/14 0335   Gross per 24 hour   Intake   1080 ml   Output    200 ml   Net    880 ml         Physical Exam:   S1, S2 normal, no murmur, rub or gallop, regular rate and rhythm  clear to auscultation bilaterally  abdomen is soft without significant tenderness, masses, organomegaly or  guarding  extremities normal, atraumatic, no cyanosis or edema  Labs:  Lab Results   Component Value Date    WBC 2.6* 04/03/2014    HGB 8.8* 04/03/2014    HCT 26.0* 04/03/2014    PLT 67* 04/03/2014    CHOL 101 04/02/2014    TRIG 83 04/02/2014    HDL 22* 04/02/2014    ALT 27 04/01/2014    AST 52* 04/01/2014    NA 138 04/03/2014    K 4.2 04/03/2014    CL 109 04/03/2014    CREATININE 1.0 04/03/2014    BUN 23* 04/03/2014    CO2 27 04/03/2014    TSH 2.15 08/15/2013    PSA 0.55 08/15/2013    INR 1.40* 04/02/2014    LABA1C 5.0 01/10/2014    LABMICR Not Indicated 04/01/2014     Lab Results   Component Value Date    TROPONINI 0.03* 04/01/2014       Recent Imaging Results are Reviewed:  Ct Head Wo Contrast    04/01/2014   EXAMINATION: CT OF THE HEAD WITHOUT CONTRAST  04/01/2014 3:41 pm:  TECHNIQUE: CT of the head was performed without the administration of intravenous contrast.  COMPARISON: 10/30/2010  HISTORY: fixed dilated R pupil  FINDINGS: BRAIN/VENTRICLES: There is no acute intracranial hemorrhage, mass effect or midline shift.  No abnormal extra-axial fluid collection.  The gray-white differentiation is maintained without evidence of an acute infarct.  There is no evidence of hydrocephalus. Chronic atrophic and ischemic white matter changes are stable.  ORBITS: The visualized portion of the orbits demonstrate no acute abnormality.  SINUSES: The visualized paranasal sinuses and mastoid air cells demonstrate no acute abnormality.  SOFT TISSUES/SKULL:  No acute abnormality of the visualized skull or soft tissues.     04/01/2014   IMPRESSION: No acute intracranial abnormality.     Xr Chest Portable    04/01/2014   EXAMINATION: SINGLE VIEW OF THE CHEST  04/01/2014 3:53 pm  COMPARISON: None available.  HISTORY: HoTN, dizzy  Hypotension  FINDINGS: There is bibasilar scarring and/or atelectasis with a probable small left pleural effusion.  The cardiac silhouette is top-normal in size.  There is no pneumothorax.  There is an old  healed left rib fracture.     04/01/2014   IMPRESSION: 1. Probable small left pleural effusion with compressive atelectasis.       Assessment and Plan:  Patient Active Hospital Problem List:   Hypotension (04/01/2014)    Assessment: doing better     Plan: cont to hold bp meds. Will need some titration on d/c   Alpha-1-antitrypsin deficiency (10/09/2009)    Assessment: Stable    Plan: Continue present orders/plan   Cirrhosis, nonalcoholic (HCC) ()    Assessment: Stable    Plan: Continue present orders/plan   Chronic systolic CHF (congestive heart failure) ([place]) (10/18/2013)    Assessment: EF 50% on bedside echo    Plan: Continue present orders/plan   Fixed pupil of right eye (04/02/2014)    Assessment: unchanged. No changes to vision compared to yest    Plan: Continue present orders/plan               Deniece Portela  04/03/2014

## 2014-04-03 NOTE — Progress Notes (Signed)
Andrew [name]  Neurology Follow-up  [facility] Neurology    Date of Service: 04/03/2014    Subjective:   CC/HPI:    Events noted. Chart and lab reviewed. The patient denies any new symptoms today. Other ROS was unchanged. Test results discussed with the patient and his wife.  Doppler showed no significant stenosis.    ROS:  Unchanged since yesterday  Past Medical History   Diagnosis Date   ??? Hypertension    ??? Kidney stones    ??? Alpha 1 antitrypsin deficiency    ??? Thrombocytopenia ([place]) 04/30/2009   ??? BPH (benign prostatic hyperplasia)    ??? 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 Chronic systolic CHF (congestive heart failure) ([place]) 10/18/2013   ??? CAD (coronary artery disease)      Non-obstructive   ??? Compression fracture of lumbar vertebra (HCC)      multiple, lumbar     family history includes Diabetes in his brother, mother, and sister.   reports that he has never smoked. He has never used smokeless tobacco. He reports that he does not drink alcohol or use illicit drugs.     Objective:  Exam:  BP 100/55 mmHg   Pulse 60   Temp(Src) 98 ??F (36.7 ??C) (Temporal)   Resp 18   Ht 5\' 5"  (1.651 m)   Wt 185 lb 13.6 oz (84.3 kg)   BMI 30.93 kg/m2   SpO2 97%    Constitutional: Weight: well-nourished.   Head: Size/Trauma: normocephalic  Neck: supple and no carotid bruits.   Heart: S1S2, RR  Mental Status: Oriented to person, place, problem, and time.   Cranial Nerves:  2-12 wnl.   Motor Exam: 5/5 in all 4 extremities.   Coordination: no pronator drift, no dysmetria. Finger nose finger testing within normal limits.  Sensation: normal to all modalities.  Gait/Posture: NT    Medications:  Current Facility-Administered Medications   Medication Dose Route Frequency Provider Last Rate Last Dose   ??? lisinopril (PRINIVIL;ZESTRIL) tablet 5 mg  5 mg Oral Daily Connye Burkitt, MD   5 mg at 04/03/14 1100   ??? lactulose (CHRONULAC) 10 GM/15ML solution 20 g  20 g Oral TID Glenda Chroman, PA-C   20 g at 04/03/14 0849   ???  rifaximin (XIFAXAN) tablet 550 mg  550 mg Oral BID Deniece Portela, MD   550 mg at 04/03/14 0849   ??? magnesium oxide (MAG-OX) tablet 400 mg  400 mg Oral BID Deniece Portela, MD   400 mg at 04/03/14 0849   ??? traMADol (ULTRAM) tablet 50 mg  50 mg Oral Q12H PRN Deniece Portela, MD       ??? carvedilol (COREG) tablet 6.25 mg  6.25 mg Oral BID WC Deniece Portela, MD   Stopped at 04/02/14 [phone]   ??? potassium chloride SA (K-DUR;KLOR-CON M) tablet 40 mEq  40 mEq Oral PRN Deniece Portela, MD        Or   ??? potassium chloride (KAYCIEL) 20 MEQ/15ML (10%) oral solution 40 mEq  40 mEq Oral PRN Deniece Portela, MD       ??? 0.9 % sodium chloride bolus  500 mL Intravenous PRN Deniece Portela, MD       ??? sodium chloride flush 0.9 % injection 10 mL  10 mL Intravenous 2 times per day Deniece Portela, MD   10 mL at 04/03/14 1101   ??? sodium chloride  flush 0.9 % injection 10 mL  10 mL Intravenous PRN Deniece Portela, MD       ??? potassium chloride 10 mEq/100 mL IVPB (Peripheral Line)  10 mEq Intravenous PRN Deniece Portela, MD       ??? acetaminophen (TYLENOL) tablet 650 mg  650 mg Oral Q4H PRN Deniece Portela, MD       ??? HYDROcodone-acetaminophen [facility]) 5-325 MG per tablet 1 tablet  1 tablet Oral Q4H PRN Deniece Portela, MD        Or   ??? HYDROcodone-acetaminophen [facility]) 5-325 MG per tablet 2 tablet  2 tablet Oral Q4H PRN Deniece Portela, MD       ??? morphine (PF) injection 2 mg  2 mg Intravenous Q2H PRN Deniece Portela, MD        Or   ??? morphine (PF) injection 4 mg  4 mg Intravenous Q2H PRN Deniece Portela, MD       ??? magnesium hydroxide (MILK OF MAGNESIA) 400 MG/5ML suspension 30 mL  30 mL Oral Daily PRN Deniece Portela, MD       ??? ondansetron [facility]) injection 4 mg  4 mg Intravenous Q6H PRN Deniece Portela, MD       ??? aspirin EC tablet 325 mg  325 mg Oral Daily Deniece Portela, MD   325 mg at 04/03/14 0109   ??? enoxaparin (LOVENOX) injection 40 mg  40 mg Subcutaneous Daily Deniece Portela, MD   40 mg at 04/03/14 0849   ??? nitroGLYCERIN  (NITROSTAT) SL tablet 0.4 mg  0.4 mg Sublingual Q5 Min PRN Deniece Portela, MD           Data:  LABS:   Lab Results   Component Value Date    NA 138 04/03/2014    K 4.2 04/03/2014    CL 109 04/03/2014    CO2 27 04/03/2014    BUN 23 04/03/2014    CREATININE 1.0 04/03/2014    GFRAA >60 04/03/2014    GFRAA >60 05/14/2011    LABGLOM >60 04/03/2014    GLUCOSE 97 04/03/2014    PHOS 2.9 04/06/2010    MG 1.50 01/14/2014    CALCIUM 8.4 04/03/2014     Lab Results   Component Value Date    WBC 2.6 04/03/2014    RBC 2.23 04/03/2014    HGB 8.8 04/03/2014    HCT 26.0 04/03/2014    MCV 116.7 04/03/2014    RDW 18.9 04/03/2014    PLT 67 04/03/2014     Lab Results   Component Value Date    INR 1.40* 04/02/2014    PROTIME 16.1* 04/02/2014     Neuroimaging and labs reviewed by me and discussed results with the patient    Impression:  Acute dizziness, presyncope and visual disturbance seems to be resolving. This occurred in a setting of hypotension. Possible hypoperfusion insults or TIA.   CAD  Hypertension  Hypotension  Dehydration  Hyperlipidemia       Recommendation  ASA  Statin  BP monitor at home  Stroke prevention was discussed with the patient  Outpatient ophthalmology examination  Can be discharged from neurology when medically stable  No further recommendation     Dellis Filbert, MD   [phone]      This dictation was generated by voice recognition computer software. Although all attempts are made to edit the dictation for accuracy, there may be errors in the transcription that are  not intended.   '

## 2014-04-03 NOTE — Progress Notes (Addendum)
[facility]  Gastroenterology Progress Note    Andrew [name] is a 74 y.o. male patient.  Principal Problem:    Hypotension  Active Problems:    Alpha-1-antitrypsin deficiency    Cirrhosis, nonalcoholic (HCC)    Chronic systolic CHF (congestive heart failure) (HCC)    Non-ischemic cardiomyopathy (HCC)    Abnormal EKG    Visual changes    Fixed pupil of right eye    Anemia    Hemispheric carotid artery syndrome    CAD in native artery    Hyperlipidemia      SUBJECTIVE:  No complaints. Has had 2 loose stools so far today.     ROS:  No fever, chills  No chest pain, palpitations  No SOB, cough  Gastrointestinal ROS: see above    Physical    VITALS:  BP 100/55 mmHg   Pulse 60   Temp(Src) 98 ??F (36.7 ??C) (Temporal)   Resp 18   Ht 5\' 5"  (1.651 m)   Wt 185 lb 13.6 oz (84.3 kg)   BMI 30.93 kg/m2   SpO2 97%  TEMPERATURE:  Current - Temp: 98 ??F (36.7 ??C); Max - Temp  Avg: 98.2 ??F (36.8 ??C)  Min: 98 ??F (36.7 ??C)  Max: 98.4 ??F (36.9 ??C)    NAD  Regular rate   Lungs CTA Bilaterally  Abdomen soft, ND, NT,  Bowel sounds normal.    Data    Data Review:    Recent Labs      04/01/14   1513  04/02/14   0320  04/03/14   0330   WBC  2.3*  2.0*  2.6*   HGB  8.9*  8.1*  8.8*   HCT  25.9*  23.9*  26.0*   MCV  115.5*  116.1*  116.7*   PLT  67*  58*  67*     Recent Labs      04/01/14   1513  04/02/14   0320  04/03/14   0330   NA  138  140  138   K  4.9  4.9  4.2   CL  105  107  109   CO2  28  28  27    BUN  33*  29*  23*   CREATININE  1.4*  1.2  1.0     Recent Labs      04/01/14   1513   AST  52*   ALT  27   BILITOT  1.1*   ALKPHOS  92     No results for input(s): LIPASE, AMYLASE in the last 72 hours.  Recent Labs      04/02/14   1240   PROTIME  16.1*   INR  1.40*     No results for input(s): PTT in the last 72 hours.        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 No  obvious ascites on exam. He wife reported some confusion at home but is now back to baseline.   Macrocytic anemia - no signs of GI bleed. Negative EGD and colonoscopy 2 months ago. No iron, folate, or B12 deficiency back in Nov. Consider heme eval.  Pancytopenia - could be from cirrhosis. Could consider heme eval.     PLAN :  - diuretics per cardiology  -  continue lactulose and xifaxan  - f/u with Dr. Einar Grad.     Discussed with Dr. Scheryl Darter.  Erasmo Leventhal, PA-C  [place]    I have personally performed a face to face diagnostic evaluation on this patient.  I have interviewed and examined the patient and I agree with the findings and recommended plan of care.  In summary, my findings and plan are the following: per pt/wife mentating baseline, abd soft, no acute liver issues, can f/u dr Einar Grad, will sign off, call if needed.      Geannie Risen, MD  [place] [facility] [place]

## 2014-04-03 NOTE — Progress Notes (Signed)
Andrew [name]   Progress Note  CHF/Pulmonary Hypertension Cardiology    Chief complaint:  We are following this patient for hypotension, CHF  HPI:  74 y.o. male presents from cardiac rehab with complaints of hypotension and dizziness. 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 Most recent lvef improved to 45%. No syncope.He does report intermittent blurred vision and that he still states he experiences from his right eye every morning. He states that the day goes on this resolves itself. He denies any other visual disturbances, weakness, numbness or abnormal coordination. Patient's wife reports that he has been slightly more confused over the past few weeks as well. Patient has no known history of dementia or any other neurological conditions including stroke. He denies any headaches or neck pain. No known fevers or chills. No other recent illness. He has no other complaints or concerns at this time.he is very good about fluid restriction and sodium restriction. Blood pressure low for a few days. Labs look dehydrated.      ROS:  Feels back to baseline.  Wants to go home.  He has no shortness of breath.  Has been off lasix.  Echo shows normal LVEF.    Medications/Labs all Reviewed    Lab Results   Component Value Date    WBC 2.6* 04/03/2014    HGB 8.8* 04/03/2014    HCT 26.0* 04/03/2014    MCV 116.7* 04/03/2014    PLT 67* 04/03/2014     Lab Results   Component Value Date    CREATININE 1.0 04/03/2014    BUN 23* 04/03/2014    NA 138 04/03/2014    K 4.2 04/03/2014    CL 109 04/03/2014    CO2 27 04/03/2014     Lab Results   Component Value Date    INR 1.40* 04/02/2014    PROTIME 16.1* 04/02/2014        Physical Examination:    BP 100/55 mmHg   Pulse 60   Temp(Src) 98 ??F (36.7 ??C) (Temporal)   Resp 18   Ht 5\' 5"  (1.651 m)   Wt 185 lb 13.6 oz (84.3 kg)   BMI 30.93 kg/m2   SpO2 97%     Respiratory:  ?? Resp Assessment: Normal respiratory effort  ?? Resp Auscultation: Clear  to auscultation bilaterally   Cardiovascular:  ?? Auscultation: regular rate and rhythm, normal S1S2, no murmur, rub or gallop  ?? Palpation:  Nl PMI  ?? JVP:  normal  ?? Extremities: No Edema  Abdomen:  ?? Soft, non-tender  ?? Normal bowel sounds  Extremities:  ??  No Cyanosis or Clubbing  Neurological/Psychiatric:  ?? Oriented to time, place, and person  ?? Non-anxious  Skin Warm and dry    Assessment:    Patient Active Hospital Problem List:     Hypotension, unspecified hypotension type : Due to volume depletion    2.  Fixed pupil of right eye    3.  Blurred vision, right eye    4.  Pleural effusion    5. Anemia: Slightly worse    He was dry on admission, now better  Change lasix to prn  Send home on lisinopril 5 mg po qhs  Can consider eplerenone at follow up appt depending upon labs  Continue coreg at current dose  Follow up in office in 2 weeks.          Connye Burkitt, MD, 04/03/2014 3:00 PM

## 2014-04-03 NOTE — Progress Notes (Signed)
Dr. Tasia Catchings called orders to continue all home meds BP and diuretics per cardiology.  Will page GI for pending d/c.

## 2014-04-03 NOTE — Discharge Instructions (Signed)
Heart Failure: Care Instructions  Your Care Instructions     Heart failure occurs when your heart does not pump as much blood as the body needs. Failure does not mean that the heart has stopped pumping but rather that it is not pumping as well as it should. Over time, this causes fluid buildup in your lungs and other parts of your body. Fluid buildup can cause shortness of breath, fatigue, swollen ankles, and other problems. By taking medicines regularly, reducing sodium (salt) in your diet, checking your weight every day, and making lifestyle changes, you can feel better and live longer.  Follow-up care is a key part of your treatment and safety. Be sure to make and go to all appointments, and call your doctor if you are having problems. It's also a good idea to know your test results and keep a list of the medicines you take.  How can you care for yourself at home?  Medicines   Be safe with medicines. Take your medicines exactly as prescribed. Call your doctor if you think you are having a problem with your medicine.   Do not take any vitamins, over-the-counter medicine, or herbal products without talking to your doctor first. Do not take ibuprofen (Advil or Motrin) and naproxen (Aleve) without talking to your doctor first. They could make your heart failure worse.   You may be taking some of the following medicine.   Beta-blockers can slow heart rate, decrease blood pressure, and improve your condition. Taking a beta-blocker may lower your chance of needing to be hospitalized.   Angiotensin-converting enzyme inhibitors (ACEIs) reduce the heart's workload, lower blood pressure, and reduce swelling. Taking an ACEI may lower your chance of needing to be hospitalized again.   Angiotensin II receptor blockers (ARBs) work like ACEIs. Your doctor may prescribe them instead of ACEIs.   Diuretics, also called water pills, reduce swelling.   Potassium supplements replace this important mineral, which is sometimes  lost with diuretics.   Aspirin and other blood thinners prevent blood clots, which can cause a stroke or heart attack.  You will get more details on the specific medicines your doctor prescribes.  Diet   Your doctor may suggest that you limit sodium to 2,000 milligrams (mg) a day or less. That is less than 1 teaspoon of salt a day, including all the salt you eat in cooking or in packaged foods. People get most of their sodium from processed foods. Fast food and restaurant meals also tend to be very high in sodium.   Ask your doctor how much liquid you can drink each day. You may have to limit liquids.  Weight   Weigh yourself without clothing at the same time each day. Record your weight. Call your doctor if you gain more than 3 pounds in 2 to 3 days. A sudden weight gain may mean that your heart failure is getting worse.  Activity level   Start light exercise (if your doctor says it is okay). Even if you can only do a small amount, exercise will help you get stronger, have more energy, and manage your weight and your stress. Walking is an easy way to get exercise. Start out by walking a little more than you did before. Bit by bit, increase the amount you walk.   When you exercise, watch for signs that your heart is working too hard. You are pushing yourself too hard if you cannot talk while you are exercising. If you become short of breath   or dizzy or have chest pain, stop, sit down, and rest.   If you feel "wiped out" the day after you exercise, walk slower or for a shorter distance until you can work up to a better pace.   Get enough rest at night. Sleeping with 1 or 2 pillows under your upper body and head may help you breathe easier.  Lifestyle changes   Do not smoke. Smoking can make a heart condition worse. If you need help quitting, talk to your doctor about stop-smoking programs and medicines. These can increase your chances of quitting for good. Quitting smoking may be the most important step you  can take to protect your heart.   Limit alcohol to 2 drinks a day for men and 1 drink a day for women. Too much alcohol can cause health problems.   Avoid getting sick from colds and the flu. Get a pneumococcal vaccine shot. If you have had one before, ask your doctor whether you need another dose. Get a flu shot each year. If you must be around people with colds or the flu, wash your hands often.  When should you call for help?  Call 911 if you have symptoms of sudden heart failure such as:   You have severe trouble breathing.   You cough up pink, foamy mucus.   You have a new irregular or rapid heartbeat.  Call your doctor now or seek immediate medical care if:   You have new or increased shortness of breath.   You are dizzy or lightheaded, or you feel like you may faint.   You have sudden weight gain, such as 3 pounds or more in 2 to 3 days.   You have increased swelling in your legs, ankles, or feet.   You are suddenly so tired or weak that you cannot do your usual activities.  Watch closely for changes in your health, and be sure to contact your doctor if:   You develop new symptoms.   Where can you learn more?   Go to [email or web address] and sign in to your MyChart account. Enter [phone] in the [place] box to learn more about "Heart Failure: Care Instructions."    If you do not have an account, please click on the "Sign Up Now" link.      2006-2015 [employer]. Care instructions adapted under license by [facility]. This care instruction is for use with your licensed healthcare professional. If you have questions about a medical condition or this instruction, always ask your healthcare professional. [employer] any warranty or liability for your use of this information.  Content Version: 10.6.465758; Current as of: April 27, 2013              Do you have the help you need at home?    What symptoms or health problems do you  need to look out for after you leave the hospital? Call your physician if you have any of these symptoms.   :   . If you have sudden, severe shortness of breath or shortness of breath while resting.  . Pain, tenderness, warmth or redness in your calf.  . Temperature  greater than 101?[name]  . Persistent diarrhea, nausea or vomiting.  . If you are unable to eat, or unable to drink 5 glasses of fluid a day for more than one day.  . If you experience the same pain or other symptoms that brought you to the hospital.  . If you  become lightheaded or dizzy.      If you think something is seriously wrong go to the nearest emergency room!    Call 911 for any EMERGENCY and go to the nearest hospital!    **Follow up with Dr. Mignon Pine as needed**  **Follow up with an opthamologists to diagnose right eye abnormalities**

## 2014-04-15 ENCOUNTER — Ambulatory Visit: Admit: 2014-04-15 | Discharge: 2014-04-15 | Payer: MEDICARE | Attending: Internal Medicine | Primary: Internal Medicine

## 2014-04-15 DIAGNOSIS — D61818 Other pancytopenia: Secondary | ICD-10-CM

## 2014-04-15 NOTE — Progress Notes (Signed)
[facility]  Internal Medicine  Patient Encounter  Alecia Lemming, D.O., [facility]        Chief Complaint   Patient presents with   ??? Hypertension       HPI: 74 y.o. male with multiple complicated medical issues, seen today for follow up regarding chronic medical problems. He was hospitalized 1/25 due to hypotension at Cardiac rehab.      Glaucoma-- he was referred due to the fact that he was found to have an unreactive right pupil.  Patient sees Dr. 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  Patient was seen the following day for Laser Surgery for pressure relief. The right eye lid turns in.  Patient also has bilateral cataract, follow up on note.  Will see Dr. Marko Plume today.  He is due to see another specialist for the cataracts.     HTN-- BP checks at home:No  Patient was hospitalized, [facility] on 04/01/2014 after going to cardiac rehab and was found to have severely low blood pressure. Admitted for 3 days. Carotid Doppler was done 04/01/2014 - no significant hemodynamic blockage bilaterally  Chest XR -   There is bibasilar scarring and/or atelectasis with a probable small left   pleural effusion. The cardiac silhouette is top-normal in size. There is no   pneumothorax. There is an old healed left rib fracture.   Echo- EF 50%  Patient was taken off spironolactone, lasix, and amiloride. Lisinopril was decreased to 5 mg.  BP is better.    Does not adhere to a low salt diet.  His swelling has not worsened.    Pt denies blurred vision, chest pain and palpitations, dizziness.  Pt complains of dyspnea and peripheral edema. No cough or near syncope episodes. Tolerating medications: Yes      Chronic Systolic CHF-- Seen by Dr. Jacqualyn Posey, appt 04/18/2014 .  He has had LAB.  He denies CP, orthopnea, PND. Endorse DOE  He does have swelling not out of the ordinary.  He dioes have fatigued.  He tries to elevate legs.  He does not  adhere to lower salt diet. He is in cardiac rehab.    BNP 04/03/2014-- 864.      Cirrhosis-- He is under the care of Dr. Einar Grad, next appt 07/2014. Transaminases lab 04/01/2014-- ALT(27), AST(52), T. Bili was 1.1. LAB shows the expected thrombocytopenia and Leukopenia. Patient denies hematemesis, hematochezia, melena, or hematuria. Patient states he bruises easy.  ABD [place] was done.  I do dnot have this report.  Pt was told it is OK.      Recent LAB shows worsening anemia 04/03/2014 Hemoglobin 8.8, Hbg 01/14/2014 10.7. RBC indices-- macrocytic. He does not drink alcohol.   His last colonoscopy 06/2010.  He has EGD and colonoscopy on 01/2014 at Dr. Einar Grad office, patient states he there was no change. Gets EGD and colonoscopy yearly.  He had no varices or ulcers.        Currently takes rifaximin and lactulose. Denies mental status changes, tremor, or confusion.    Low back pain.  Chronic. Patient states his back pain has radiated towards his flank. No radiattion pain down legs, no numbness or tingling in legs.  He had Compression fracture and L-DDD.  DEXA 08/2013-- Osteopenia.  He is on Tramadol.  He was seeing Dr. Andrey Cota (Neurosurgery with [facility]) was released due to no surgery plans.  No urine or bowel incontinence, no  saddle numbness or tingling.  Pain is better with exercising.        Past Medical History   Diagnosis Date   ??? Hypertension    ??? Kidney stones    ??? Alpha 1 antitrypsin deficiency    ??? Thrombocytopenia ([place]) 04/30/2009   ??? BPH (benign prostatic hyperplasia)    ??? 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 Chronic systolic CHF (congestive heart failure) ([place]) 10/18/2013   ??? CAD (coronary artery disease)      Non-obstructive   ??? Compression fracture of lumbar vertebra (H[facility])      multiple, lumbar       Review of Systems - As per HPI      OBJECTIVE:  BP 115/60 mmHg   Pulse 60   Wt 195 lb (88.451 kg)   Wt Readings from Last 3 Encounters:   04/15/14 195 lb (88.451 kg)   04/03/14 185 lb 13.6 oz (84.3  kg)   01/16/14 195 lb (88.451 kg)       GEN: NAD, A&O, Non-toxic  HEENT: NC/AT, No scleral icterus, EOMI, Right eye does not constrict in response to light, Left eye is reactive to light,  No consensual light reflex in the right eye, + Entropion.  Oral cavity Clear,  TM's NL  NECK: Supple.  No thyromegaly.  + Increase JVP, 6 cm H2O sitting.    LYMPH: No C/SC nodes.  CV: S1 S2 NL, RRR.  No murmurs, clicks or rubs. No JVD  PULM: CTA, symmentric air exchange, no crackles  EXT: BL 2 + LE edema.    GI: Abdomen is soft, NT, BS+, No hepatomegaly.  VASC:  No carotid bruits.    MS:  Increased thoracic Kyphosis.  No major tenderness of the T/L paraspinals.    SKIN:  Dusky.      ASSESSMENT::  Encounter Diagnoses   Name Primary?   ??? Screening Yes   ??? Depression screening    ??? Pancytopenia ([place])    ??? Cirrhosis, nonalcoholic ([place])    ??? Chronic systolic CHF (congestive heart failure) ([place])    ??? Chronic low back pain    ??? Non-ischemic cardiomyopathy ([place])    ??? Lumbar compression fracture, sequela    ??? Coagulopathy ([place])        Plan:  1. Nutritional support.  Check with Dr. Einar Grad on supplements  2. No new med changes.    3. Continue tramadol for pain  4. Obtain office reports from Dr. Einar Grad.      45 minutes spent with the pt.  25 min spent reviewing test results and discussing plan of care and counseled on anemia, glaucoma, nutrition.  He has met with dietician in the past.        Discussed medications with patient who voiced understanding of their use, indication and potential side effects.  Pt also understands the above recommendations.   All questions answered.    Medical student notes reviewed and agreed.  Pt was was independently interviewed and examined by myself. Changes made to note when appropriate

## 2014-04-15 NOTE — Patient Instructions (Signed)
Chronic Pain: Care Instructions  Your Care Instructions  Chronic pain is pain that lasts a long time (months or even years) and may or may not have a clear cause. It is different from acute pain, which usually does have a clear cause--like an injury or illness--and gets better over time. Chronic pain:  ?? Lasts over time but may vary from day to day.  ?? Does not go away despite efforts to end it.  ?? May disrupt your sleep and lead to fatigue.  ?? May cause depression or anxiety.  ?? May make your muscles tense, causing more pain.  ?? Can disrupt your work, hobbies, home life, and relationships with friends and family.  Chronic pain is a very real condition. It is not just in your head. Treatment can help and usually includes several methods used together, such as medicines, physical therapy, exercise, and other treatments. Learning how to relax and changing negative thought patterns can also help you cope.  Chronic pain is complex. Taking an active role in your treatment will help you better manage your pain. Tell your doctor if you have trouble dealing with your pain. You may have to try several things before you find what works best for you.  Follow-up care is a key part of your treatment and safety. Be sure to make and go to all appointments, and call your doctor if you are having problems. It???s also a good idea to know your test results and keep a list of the medicines you take.  How can you care for yourself at home?  ?? Pace yourself. Break up large jobs into smaller tasks. Save harder tasks for days when you have less pain, or go back and forth between hard tasks and easier ones. Take rest breaks.  ?? Relax, and reduce stress. Relaxation techniques such as deep breathing or meditation can help.  ?? Keep moving. Gentle, daily exercise can help reduce pain over the long run. Try low- or no-impact exercises such as walking, swimming, and stationary biking. Do stretches to stay flexible.  ?? Try heat, cold packs, and  massage.  ?? Get enough sleep. Chronic pain can make you tired and drain your energy. Talk with your doctor if you have trouble sleeping because of pain.  ?? Think positive. Your thoughts can affect your pain level. Do things that you enjoy to distract yourself when you have pain instead of focusing on the pain. See a movie, read a book, listen to music, or spend time with a friend.  ?? If you think you are depressed, talk to your doctor about treatment.  ?? Keep a daily pain diary. Record how your moods, thoughts, sleep patterns, activities, and medicine affect your pain. You may find that your pain is worse during or after certain activities or when you are feeling a certain emotion. Having a record of your pain can help you and your doctor find the best ways to treat your pain.  ?? Take pain medicines exactly as directed.  ?? If the doctor gave you a prescription medicine for pain, take it as prescribed.  ?? If you are not taking a prescription pain medicine, ask your doctor if you can take an over-the-counter medicine.  Reducing constipation caused by pain medicine  ?? Include fruits, vegetables, beans, and whole grains in your diet each day. These foods are high in fiber.  ?? Drink plenty of fluids, enough so that your urine is light yellow or clear like water. If you   have kidney, heart, or liver disease and have to limit fluids, talk with your doctor before you increase the amount of fluids you drink.  ?? If your doctor recommends it, get more exercise. Walking is a good choice. Bit by bit, increase the amount you walk every day. Try for at least 30 minutes on most days of the week.  ?? Schedule time each day for a bowel movement. A daily routine may help. Take your time and do not strain when having a bowel movement.  When should you call for help?  Call your doctor now or seek immediate medical care if:  ?? Your pain gets worse or is out of control.  ?? You feel down or blue, or you do not enjoy things like you once did.  You may be depressed, which is common in people with chronic pain. Depression can be treated.  ?? You have vomiting or cramps for more than 2 hours.  Watch closely for changes in your health, and be sure to contact your doctor if:  ?? You cannot sleep because of pain.  ?? You are very worried or anxious about your pain.  ?? You have trouble taking your pain medicine.  ?? You have any concerns about your pain medicine.  ?? You have trouble with bowel movements, such as:  ?? No bowel movement in 3 days.  ?? Blood in the anal area, in your stool, or on the toilet paper.  ?? Diarrhea for more than 24 hours.   Where can you learn more?   Go to [email or web address] and sign in to your MyChart account. Enter N004 in the [place] box to learn more about ???Chronic Pain: Care Instructions.???    If you do not have an account, please click on the ???Sign Up Now??? link.     ?? 2006-2015 [employer]. Care instructions adapted under license by [facility]. This care instruction is for use with your licensed healthcare professional. If you have questions about a medical condition or this instruction, always ask your healthcare professional. [place] any warranty or liability for your use of this information.  Content Version: 10.6.465758; Current as of: April 27, 2013              Cirrhosis: Care Instructions  Your Care Instructions     Cirrhosis occurs when healthy tissue in your liver gets scarred. This keeps the liver from working well. It usually happens after a liver has been inflamed for years.  Cirrhosis is most often caused by alcohol abuse or hepatitis infection. But there are other causes too. These include medicines and too much fat in the liver. Conditions passed down in families and other disorders can also cause it. In some cases, no cause can be found.  Treatment can't completely fix liver damage. But you may be able to slow or prevent more damage if you  don't drink alcohol or use drugs that harm your liver.  Follow-up care is a key part of your treatment and safety. Be sure to make and go to all appointments, and call your doctor if you are having problems. It's also a good idea to know your test results and keep a list of the medicines you take.  How can you care for yourself at home?  ?? Do not drink any alcohol. It can harm your liver. Talk to your doctor if you need help to stop drinking.  ?? Be safe with medicines. Take your medicines exactly as  prescribed. Call your doctor if you think you are having a problem with your medicine.  ?? Talk to your doctor before you take any other medicines. These include over-the-counter medicines and herbal products.  ?? Be careful taking acetaminophen (Tylenol), ibuprofen (Advil, Motrin), or naproxen (Aleve). These can sometimes cause more liver damage. Talk with your doctor if you're not sure which medicines are safe.  ?? If your cirrhosis causes extra fluid to build up in your body, try not to eat a lot of salt. Use less salt when you cook and at the table. Don't eat fast foods or snack foods with a lot of salt. Extra fluid in your belly, legs, and chest can cause serious problems.  ?? Work with your doctor or a dietitian to be sure you eat the right amount of carbohydrate, protein, fat, and sodium (salt). It's very important to choose the best foods for the health of your liver.  ?? If your doctor recommends it, limit how much fluid you drink.  ?? If your doctor recommends it, get more exercise. Walking is a good choice. Bit by bit, increase the amount you walk every day. Try for at least 30 minutes on most days of the week. You also may want to swim, bike, or do other activities.  When should you call for help?  Call 911 anytime you think you may need emergency care. For example, call if:  ?? You passed out (lost consciousness).  Call your doctor now or seek immediate medical care if:  ?? You feel very sleepy or confused.  ?? You  have new belly pain, or your pain gets worse.  ?? You have a fever.  ?? There is a new or increasing yellow tint to your skin or the whites of your eyes.  ?? You have any abnormal bleeding, such as:  ?? Nosebleeds.  ?? Vaginal bleeding that is different (heavier, more frequent, at a different time of the month) than what you are used to.  ?? Bloody or black stools, or rectal bleeding.  ?? Bloody or pink urine.  Watch closely for changes in your health, and be sure to contact your doctor if you have any problems.   Where can you learn more?   Go to [email or web address] and sign in to your MyChart account. Enter M412 in the [place] box to learn more about ???Cirrhosis: Care Instructions.???    If you do not have an account, please click on the ???Sign Up Now??? link.     ?? 2006-2015 [employer]. Care instructions adapted under license by [facility]. This care instruction is for use with your licensed healthcare professional. If you have questions about a medical condition or this instruction, always ask your healthcare professional. [place] any warranty or liability for your use of this information.  Content Version: 10.6.465758; Current as of: January 19, 2013              Anemia: Care Instructions  Your Care Instructions     Anemia is a low level of red blood cells, which carry oxygen throughout your body. Many things can cause anemia. Lack of iron is one of the most common causes. Your body needs iron to make hemoglobin, a substance in red blood cells that carries oxygen from the lungs to your body's cells. Without enough iron, the body produces fewer and smaller red blood cells. As a result, your body's cells do not get enough oxygen, and you feel tired and  weak. And you may have trouble concentrating.  Bleeding is the most common cause of a lack of iron. You may have heavy menstrual bleeding or bleeding caused by conditions such as ulcers,  hemorrhoids, or cancer. Regular use of aspirin or other anti-inflammatory medicines (such as ibuprofen) also can cause bleeding in some people. A lack of iron in your diet also can cause anemia, especially at times when the body needs more iron, such as during pregnancy, infancy, and the teen years.  Your doctor may have prescribed iron pills. It may take several months of treatment for your iron levels to return to normal. Your doctor also may suggest that you eat foods that are rich in iron, such as meat and beans.  There are many other causes of anemia. It is not always due to a lack of iron. Finding the specific cause of your anemia will help your doctor find the right treatment for you.  Follow-up care is a key part of your treatment and safety. Be sure to make and go to all appointments, and call your doctor if you are having problems. It's also a good idea to know your test results and keep a list of the medicines you take.  How can you care for yourself at home?  ?? Take your medicines exactly as prescribed. Call your doctor if you think you are having a problem with your medicine.  ?? If your doctor recommends iron pills, take them as directed:  ?? Try to take the pills on an empty stomach about 1 hour before or 2 hours after meals. But you may need to take iron with food to avoid an upset stomach.  ?? Do not take antacids or drink milk or caffeine drinks (such as coffee, tea, or cola) at the same time or within 2 hours of the time that you take your iron. They can make it hard for your body to absorb the iron.  ?? Vitamin C (from food or supplements) helps your body absorb iron. Try taking iron pills with a glass of orange juice or some other food that is high in vitamin C, such as citrus fruits.  ?? Iron pills may cause stomach problems, such as heartburn, nausea, diarrhea, constipation, and cramps. Be sure to drink plenty of fluids, and include fruits, vegetables, and fiber in your diet each day. Iron pills  often make your bowel movements dark or green.  ?? If you forget to take an iron pill, do not take a double dose of iron the next time you take a pill.  ?? Keep iron pills out of the reach of small children. An overdose of iron can be very dangerous.  ?? Follow your doctor's advice about eating iron-rich foods. These include red meat, shellfish, poultry, eggs, beans, raisins, whole-grain bread, and leafy green vegetables.  ?? Steam vegetables to help them keep their iron content.  When should you call for help?  Call 911 anytime you think you may need emergency care. For example, call if:  ?? You have symptoms of a heart attack. These may include:  ?? Chest pain or pressure, or a strange feeling in the chest.  ?? Sweating.  ?? Shortness of breath.  ?? Nausea or vomiting.  ?? Pain, pressure, or a strange feeling in the back, neck, jaw, or upper belly or in one or both shoulders or arms.  ?? Lightheadedness or sudden weakness.  ?? A fast or irregular heartbeat.  After you call 911, the operator  may tell you to chew 1 adult-strength or 2 to 4 low-dose aspirin. Wait for an ambulance. Do not try to drive yourself.  ?? You passed out (lost consciousness).  Call your doctor now or seek immediate medical care if:  ?? You have new or increased shortness of breath.  ?? You are dizzy or lightheaded, or you feel like you may faint.  ?? Your fatigue and weakness continue or get worse.  ?? You have any abnormal bleeding, such as:  ?? Nosebleeds.  ?? Vaginal bleeding that is different (heavier, more frequent, at a different time of the month) than what you are used to.  ?? Bloody or black stools, or rectal bleeding.  ?? Bloody or pink urine.  Watch closely for changes in your health, and be sure to contact your doctor if:  ?? You do not get better as expected.   Where can you learn more?   Go to [email or web address] and sign in to your MyChart account. Enter R301 in the [place] box to learn more about ???Anemia: Care  Instructions.???    If you do not have an account, please click on the ???Sign Up Now??? link.     ?? 2006-2015 [employer], [employer]. Care instructions adapted under license by [facility]. This care instruction is for use with your licensed healthcare professional. If you have questions about a medical condition or this instruction, always ask your healthcare professional. [place] any warranty or liability for your use of this information.  Content Version: 10.6.465758; Current as of: April 27, 2013

## 2014-04-15 NOTE — Discharge Summary (Signed)
[facility] -- Physician Discharge Summary     Andrew [name]  [birth year]  MRN: [id number]    Admit Date: 04/01/2014  Discharge Date: 04/03/2014  4:24 PM    Attending MD: Deniece Portela, MD  Discharging MD: Deniece Portela, MD  PCP: Alecia Lemming, DO [place] / [place] [address] [phone]    Admission Diagnosis: HYPOTENSION    Full Hospital Problem List:  Active Hospital Problems    Diagnosis Date Noted   ??? 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 Anemia [D64.9] 04/02/2014   ??? 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 CAD in native artery [I25.10]    ??? Hyperlipidemia [E78.5]    ??? Abnormal EKG [R94.31] 04/01/2014   ??? Visual changes [H53.9] 04/01/2014   ??? 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 Chronic systolic CHF (congestive heart failure) ([place]) [I50.22] 10/18/2013   ??? 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 male presents from cardiac rehab with complaints of hypotension and dizziness. No syncope. He does have a recent history of abnormal echo, but denies any chest pain or shortness of breath at this time. He does report intermittent blurred vision and that he still states he experiences from his right eye every morning. He states that the day goes on this resolves itself. He denies any other visual disturbances, weakness, numbness or abnormal coordination. Patient's wife reports that he has been slightly more confused over the past few weeks as well. Patient has no known history of dementia or any other neurological conditions including stroke. He denies any headaches or neck pain. No known fevers or chills. No other recent illness. He has no other complaints or concerns at this time.    Pt states his normal eye check 3 mos ago was OK. Was not told of fixed R pupil at that time.  Neurology is asked to see patient  Carotid ultrasound done - 16-49% bilat    It is  felt that his low BP is due to dehydration or overaggressive bp meds  Diuretics are changed to prn only  Lisinopril decreased to 5qd per cards    Pt also has known hx of cirrhosis  Seen by GI here  No changes made to regimen (lactulose, xifixan)    Pt's eye exam does not change here during hospitalization  Unfortunately, there is no ophtho MD who comes to [place] and this cannot be evaluated here  Pt states he is able to set up outpt f/u on his own close to d/c    Consults made during Hospitalization:  IP CONSULT TO CARDIOLOGY  IP CONSULT TO OPHTHALMOLOGY  IP CONSULT TO INTERNAL MEDICINE  IP CONSULT TO CARDIOLOGY  IP CONSULT TO CARDIOLOGY  IP CONSULT TO NEUROLOGY  IP CONSULT TO GI    Treatment team at time of Discharge: Treatment Team: Attending Provider: Deniece Portela, MD; Consulting Physician: Eden Lathe, MD; Consulting Physician: Deniece Portela, MD; Registered Nurse: Jerolyn Center, RN; Consulting Physician: Connye Burkitt, MD; Consulting Physician: Lynetta Mare, MD; Respiratory Therapist (Day): Gita Kudo, RCP; Registered Nurse: Jossie Ng, RN; Registered Nurse: Dominic Pea, RN    Imaging Results:  Ct Head Wo Contrast    04/01/2014   EXAMINATION: CT OF THE HEAD WITHOUT CONTRAST  04/01/2014 3:41 pm:  TECHNIQUE: CT of the  head was performed without the administration of intravenous contrast.  COMPARISON: 10/30/2010  HISTORY: fixed dilated R pupil  FINDINGS: BRAIN/VENTRICLES: There is no acute intracranial hemorrhage, mass effect or midline shift.  No abnormal extra-axial fluid collection.  The gray-white differentiation is maintained without evidence of an acute infarct.  There is no evidence of hydrocephalus. Chronic atrophic and ischemic white matter changes are stable.  ORBITS: The visualized portion of the orbits demonstrate no acute abnormality.  SINUSES: The visualized paranasal sinuses and mastoid air cells demonstrate no acute abnormality.  SOFT TISSUES/SKULL:  No acute  abnormality of the visualized skull or soft tissues.     04/01/2014   IMPRESSION: No acute intracranial abnormality.     Xr Chest Portable    04/01/2014   EXAMINATION: SINGLE VIEW OF THE CHEST  04/01/2014 3:53 pm  COMPARISON: None available.  HISTORY: HoTN, dizzy  Hypotension  FINDINGS: There is bibasilar scarring and/or atelectasis with a probable small left pleural effusion.  The cardiac silhouette is top-normal in size.  There is no pneumothorax.  There is an old healed left rib fracture.     04/01/2014   IMPRESSION: 1. Probable small left pleural effusion with compressive atelectasis.     Vl Carotid Bilateral    04/03/2014   Carotid Duplex Study   Demographics    Patient Name      Andrew [name]    Date of Study     04/03/2014          Gender              Male    Patient Number    [id number]          Date of Birth       1940-04-26    Visit Number      [id number]         Age                 74 year(s)    Accession Number  [id number]           Room Number         2907    Corporate ID      [id number]            Sonographer         Carmina Miller,                                                            RVT, RDCS    Ordering          Zonia Kief, Interpreting        [facility]  Physician         MD                  Physician           Ladona Mow                                                            Taimoor MD, [facility],  RPVI   Procedure  Type of Study:    Cerebral:Carotid, VL CAROTID DUPLEX BILATERAL.    Vascular Sonographer Report   Impressions Right Impression The right internal carotid artery appears to have a 16-49% diameter reducing stenosis based on velocity criteria. The right vertebral artery demonstrates normal antegrade flow. The right subclavian artery is visualized and demonstrates multiphasic flow. There is tortuosity of the right internal carotid artery. Left Impression The left internal carotid artery appears to have a 16-49% diameter  reducing stenosis based on velocity criteria. The left vertebral artery demonstrates normal antegrade flow. There is tortuosity of the left internal carotid artery. The left subclavian artery is visualized and demonstrates multiphasic flow.  Conclusions    Summary    - There is mild heterogeneous plaque in the right internal carotid artery  with 16-49% diameter reducing stenosis based on velocity criteria.  - There is mild heterogeneous plaque in the left internal carotid artery  with 16-49% diameter reducing stenosis based on velocity criteria.  - Normal antegrade flow noted in vertebral arteries bilaterally.    Recommendations    Based on Medicare guidelines a follow up exam can be performed after the  date of 04/04/2015.    Signature    ------------------------------------------------------------------  Electronically signed by Albin Felling MD, [facility], [place]  (Interpreting physician) on 04/03/2014 at 04:04 PM  ------------------------------------------------------------------   Blood Pressure:Right arm 106/ mmHg.Left arm 106/ mmHg. Patient Status:Routine. [place] - Vascular Lab. Technical Quality:Adequate visualization.  Velocities are measured in cm/s ; Diameters are measured in mm  Carotid Right Measurements +---------------+----+----+-----+----+ !Location       !PSV !EDV !Angle!RI  ! +---------------+----+----+-----+----+ !Prox CCA       !78.6!24.6!60   !0.69! +---------------+----+----+-----+----+ !Dist CCA       !67.8!17.7!60   !0.74! +---------------+----+----+-----+----+ !Prox ICA       !74.3!55.5!60   !0.64! +---------------+----+----+-----+----+ !Mid ICA        !92.3!37.3!60   !0.6 ! +---------------+----+----+-----+----+ !Dist ICA       !91.3!45.2!60   !0.5 ! +---------------+----+----+-----+----+ !Prox ECA       !94.3!    !60   !    ! +---------------+----+----+-----+----+ !Vertebral      !41.6!    !60   !    ! +---------------+----+----+-----+----+ !Prox  Subclavian!110 !    !60   !    ! +---------------+----+----+-----+----+    - There is antegrade vertebral flow noted on the right side.    - Additional Measurements:ICAPSV/CCAPSV 1.36.ICAEDV/CCAEDV 1.84.  Carotid Left Measurements +---------------+----+----+-----+----+ !Location       !PSV !EDV !Angle!RI  ! +---------------+----+----+-----+----+ !Prox CCA       !72.7!32.6!60   !0.67! +---------------+----+----+-----+----+ !Dist CCA       !77  !25.1!60   !0.67! +---------------+----+----+-----+----+ !Prox ICA       !75.2!02.9!60   !0.6 ! +---------------+----+----+-----+----+ !Mid ICA        !70.7!29.9!60   !0.58! +---------------+----+----+-----+----+ !Dist ICA       !99  !23.6!60   !0.76! +---------------+----+----+-----+----+ !Prox ECA       !81.7!    !60   !    ! +---------------+----+----+-----+----+ !Vertebral      !64.4!    !60   !    ! +---------------+----+----+-----+----+ !Prox Subclavian!119 !    !60   !    ! +---------------+----+----+-----+----+    - There is antegrade vertebral flow noted on the left side.    - Additional Measurements:ICAPSV/CCAPSV 0.98.ICAEDV/CCAEDV 1.27.  Discharge Exam:  BP 114/66 mmHg   Pulse 64   Temp(Src) 98 ??F (36.7 ??C) (Temporal)   Resp 18   Ht 5\' 5"  (1.651 m)   Wt 185 lb 13.6 oz (84.3 kg)   BMI 30.93 kg/m2   SpO2 100%    General Appearance:    Alert, cooperative, no distress, appears stated age   Head:    Normocephalic, without obvious abnormality, atraumatic   Eyes:    PERRL, conjunctiva/corneas clear, EOM's intact, fundi     benign, both eyes        Ears:    Normal TM's and external ear canals, both ears   Nose:   Nares normal, septum midline, mucosa normal, no drainage    or sinus tenderness   Throat:   Lips, mucosa, and tongue normal; teeth and gums normal   Neck:   Supple, symmetrical, trachea midline, no adenopathy;        thyroid:  No enlargement/tenderness/nodules; no carotid    bruit or JVD   Back:     Symmetric, no curvature, ROM normal, no CVA tenderness    Lungs:     Clear to auscultation bilaterally, respirations unlabored   Chest wall:    No tenderness or deformity   Heart:    Regular rate and rhythm, S1 and S2 normal, no murmur, rub   or gallop   Abdomen:     Soft, non-tender, bowel sounds active all four quadrants,     no masses, no organomegaly   Genitalia:    Normal male without lesion, discharge or tenderness   Rectal:    Normal tone, normal prostate, no masses or tenderness;    guaiac negative stool   Extremities:   Extremities normal, atraumatic, no cyanosis or edema   Pulses:   2+ and symmetric all extremities   Skin:   Skin color, texture, turgor normal, no rashes or lesions   Lymph nodes:   Cervical, supraclavicular, and axillary nodes normal   Neurologic:   CNII-XII intact. Normal strength, sensation and reflexes       throughout       Disposition: home    Discharge Medications:   Deanda, [place] Medication Instructions RUE:[id number]    Printed on:04/15/14 1708   Medication Information                      carvedilol (COREG) 6.25 MG tablet  Take 1 tablet by mouth 2 times daily (with meals)             Cholecalciferol (VITAMIN D3) 1000 UNITS CAPS  Take by mouth daily              lactulose 20 GM/30ML SOLN  Take 30 mLs by mouth 2 times daily             lisinopril (PRINIVIL;ZESTRIL) 5 MG tablet  Take 1 tablet by mouth nightly 5 mg in AM and 10 mg in PM             magnesium oxide (MAGOX 400) 400 (241.3 MG) MG TABS tablet  Take 1 tablet by mouth 2 times daily             MILK THISTLE PO  Take  by mouth.             Multiple Vitamins-Minerals (CENTRUM SILVER PO)  Take  by mouth daily.             rifaximin (XIFAXAN) 550 MG  tablet  Take 550 mg by mouth 2 times daily.               traMADol (ULTRAM) 50 MG tablet  Take 1 tablet by mouth every 12 hours as needed for Pain                 Allergies:  No Known Allergies    Follow up Instructions:  Follow-up with PCP: Harris, DO in 2-4 wk .      Total time spent on day of discharge including  face-to-face visit, examination, documentation, counseling, preparation of discharge plans and followup, and discharge medicine reconciliation and presciptions is 40 minutes    Signed:  Deniece Portela, MD  04/15/2014

## 2014-04-18 ENCOUNTER — Ambulatory Visit: Admit: 2014-04-18 | Discharge: 2014-04-18 | Payer: MEDICARE | Attending: Nurse Practitioner | Primary: Internal Medicine

## 2014-04-18 DIAGNOSIS — I5022 Chronic systolic (congestive) heart failure: Secondary | ICD-10-CM

## 2014-04-18 LAB — BASIC METABOLIC PANEL
Anion Gap: 6 (ref 3–16)
BUN: 16 mg/dL (ref 7–20)
CO2: 26 mmol/L (ref 21–32)
Calcium: 8.5 mg/dL (ref 8.3–10.6)
Chloride: 108 mmol/L (ref 99–110)
Creatinine: 0.9 mg/dL (ref 0.8–1.3)
GFR African American: >60 (ref >60)
GFR Non-African American: >60 (ref >60)
Glucose: 94 mg/dL (ref 70–99)
Potassium: 4.8 mmol/L (ref 3.5–5.1)
Sodium: 140 mmol/L (ref 136–145)

## 2014-04-18 LAB — BRAIN NATRIURETIC PEPTIDE: Pro-BNP: 734 pg/mL — ABNORMAL HIGH (ref 0–124)

## 2014-04-18 NOTE — Progress Notes (Signed)
Andrew [name]   Cardiac Evaluation    Primary Care Doctor:  Alecia Lemming, DO    Chief Complaint   Patient presents with   ??? Congestive Heart Failure        History of Present Illness:   I had the pleasure of seeing Andrew [name] in follow up for sCHF, CM, HTN, and cirrhosis. He was hospitalized 1/25-28 with dizziness, confusion and vision change. His lasix was changed to PRN, spironolactone was stopped and lisinopril decreased to 5 mg HS. His Rt pupil was fixed on exam, follow up with opthalmology revealed acute glaucoma. Today he reports stable weight since discharge, 191 lbs. He has not taken a dose of lasix. His blood pressure is low here today, reports 100s at cardiac rehab and PCP office. He continues to have fatigue. He reports breast tenderness is improving off spironolactone.     Andrew [name] describes symptoms including fatigue but denies orthopnea, PND, edema.     Recent Hospitalization or Testing:   Echo 04/02/14:  -Limited bedside 2D echocardiogram was performed to evaluate EF.   -Left ventricular size is mildly increased .   -Left ventricular function is low normal with ejection fraction estimated at   50%.   -There is a question of mild inferior hypokinesis.   -There is mild mitral and trivial pulmonic and tricuspid regurgitation with   RVSP estimated at 27 mmHg.   -Right heart size is borderline dilated .   -Dilated left atrium with a volume of 17ml.  Echo 01/16/14:  -Limited 2D echocardiogram to evaluate ejection fraction.   -The left ventricular systolic function is mildly reduced with an ejection   fraction of 45%  Cardiac Cath 10/03/13:  Anatomy:   LM-normal   LAD-50% mid with FFR of 0.79  Cx-normal, dominant  OM1- normal  RCA-normal  LVEF- 30%      Hemodynamics:  RA- mean 12  RV- 37  PAWP - mean 19    PASP- 40 mmHg    C.O. 5.6    Impression:  1.  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  2.  Moderate to severe left ventricular systolic dysfunction with estimated  ejection fraction of 30%.      Echo 09/05/2013--  Dilated left ventricle. Normal wall thickness. Decreased left ventricular systolic function with an estimated EF 30-35%.Inferior wall hypokinesis E/e=25. Mild to moderate mitral regurgitation. The left atrium is dilated. Trace of aortic insufficiency. Mild tricuspid regurgitation with RVSP estimated at 36 mmHg.The aorta is within normal limits.    NYHA:  II    Past Medical History:   has a past medical history of Hypertension; Kidney stones; Alpha 1 antitrypsin deficiency; Thrombocytopenia (Andrew [name]); BPH (benign prostatic hyperplasia); Cirrhosis, nonalcoholic (Andrew [name]); Chronic systolic CHF (congestive heart failure) (Andrew [name]); CAD (coronary artery disease); and Compression fracture of lumbar vertebra (Andrew [name]).  Surgical History:   has past surgical history that includes AV fistula repair (1970's); Colonoscopy (4/12); Cardiac catheterization; and eye surgery (04/12/2014).   Social History:   reports that he has never smoked. He has never used smokeless tobacco. He reports that he does not drink alcohol or use illicit drugs.   Family History:   Family History   Problem Relation Age of Onset   ??? Diabetes Mother    ??? Diabetes Sister    ??? Diabetes Brother        Home Medications:  Prior to Admission medications    Medication Sig Start Date End Date Taking? Authorizing Provider   lisinopril (  PRINIVIL;ZESTRIL) 5 MG tablet Take 1 tablet by mouth nightly 5 mg in AM and 10 mg in PM  Patient taking differently: Take 5 mg by mouth daily  04/03/14  Yes Connye Burkitt, MD   carvedilol (COREG) 6.25 MG tablet Take 1 tablet by mouth 2 times daily (with meals) 03/18/14  Yes Donata Duff, NP   traMADol (ULTRAM) 50 MG tablet Take 1 tablet by mouth every 12 hours as needed for Pain 03/07/14  Yes Scott A Kotzin, DO   magnesium oxide (MAGOX 400) 400 (241.3 MG) MG TABS tablet Take 1 tablet by mouth 2 times daily 01/14/14  Yes Scott A Kotzin, DO   lactulose 20 GM/30ML SOLN Take 30 mLs by mouth 2  times daily 08/13/13  Yes Scott A Kotzin, DO   Cholecalciferol (VITAMIN D3) 1000 UNITS CAPS Take by mouth daily    Yes Historical Provider, MD   MILK THISTLE PO Take  by mouth.   Yes Historical Provider, MD   rifaximin (XIFAXAN) 550 MG tablet Take 550 mg by mouth 2 times daily.     Yes Historical Provider, MD   Multiple Vitamins-Minerals (CENTRUM SILVER PO) Take  by mouth daily. 08/23/08  Yes Historical Provider, MD        Allergies:  Review of patient's allergies indicates no known allergies.     Review of Systems:   ?? Constitutional: there has been no unanticipated weight loss. There's been no change in energy level, sleep pattern, or activity level.     ?? Eyes: No visual changes or diplopia. No scleral icterus.  ?? ENT: No Headaches, hearing loss or vertigo. No mouth sores or sore throat.  ?? Cardiovascular: Reviewed in HPI  ?? Respiratory: No cough or wheezing, no sputum production. No hematemesis.    ?? Gastrointestinal: No abdominal pain, appetite loss, blood in stools. No change in bowel or bladder habits.  ?? Genitourinary: No dysuria, trouble voiding, or hematuria.  ?? Musculoskeletal:  No gait disturbance, weakness or joint complaints.  ?? Integumentary: No rash or pruritis.  ?? Neurological: No headache, diplopia, change in muscle strength, numbness or tingling. No change in gait, balance, coordination, mood, affect, memory, mentation, behavior.  ?? Psychiatric: No anxiety, no depression.  ?? Endocrine: No malaise, fatigue or temperature intolerance. No excessive thirst, fluid intake, or urination. No tremor.  ?? Hematologic/Lymphatic: No abnormal bruising or bleeding, blood clots or swollen lymph nodes.  ?? Allergic/Immunologic: No nasal congestion or hives.    Physical Examination:    Filed Vitals:    04/18/14 1003   BP: 86/64   Pulse: 57   Weight: 196 lb (88.905 kg)   SpO2: 94%        Constitutional and General Appearance: Warm and dry, no apparent distress, normal coloration  HEENT:  Normocephalic,  atraumatic  Respiratory:  ?? Normal excursion and expansion without use of accessory muscles  ?? Resp Auscultation: Normal breath sounds without dullness  Cardiovascular:  ?? The apical impulses not displaced  ?? Heart tones are crisp and normal  ?? JVP ~10  ?? Regular rate and rhythm, S1, S2  ?? Peripheral pulses are symmetrical and full  ?? There is no clubbing, cyanosis of the extremities.  ?? Trace edema  ?? Pedal Pulses: 2+ and equal   Abdomen:  ?? No masses or tenderness  ?? Liver/Spleen: No Abnormalities Noted  Neurological/Psychiatric:  ?? Alert and oriented in all spheres  ?? Moves all extremities well  ?? Exhibits normal gait balance and coordination  ??  No abnormalities of mood, affect, memory, mentation, or behavior are noted    Lab Data:  CBC:   Lab Results   Component Value Date    WBC 2.6 04/03/2014    WBC 2.0 04/02/2014    WBC 2.3 04/01/2014    RBC 2.23 04/03/2014    RBC 2.06 04/02/2014    RBC 2.24 04/01/2014    HGB 8.8 04/03/2014    HGB 8.1 04/02/2014    HGB 8.9 04/01/2014    HCT 26.0 04/03/2014    HCT 23.9 04/02/2014    HCT 25.9 04/01/2014    MCV 116.7 04/03/2014    MCV 116.1 04/02/2014    MCV 115.5 04/01/2014    RDW 18.9 04/03/2014    RDW 18.9 04/02/2014    RDW 18.9 04/01/2014    PLT 67 04/03/2014    PLT 58 04/02/2014    PLT 67 04/01/2014     BMP:   Lab Results   Component Value Date    NA 138 04/03/2014    NA 140 04/02/2014    NA 138 04/01/2014    K 4.2 04/03/2014    K 4.9 04/02/2014    K 4.9 04/01/2014    CL 109 04/03/2014    CL 107 04/02/2014    CL 105 04/01/2014    CO2 27 04/03/2014    CO2 28 04/02/2014    CO2 28 04/01/2014    PHOS 2.9 04/06/2010    PHOS 3.3 10/30/2009    PHOS 2.9 10/09/2009    BUN 23 04/03/2014    BUN 29 04/02/2014    BUN 33 04/01/2014    CREATININE 1.0 04/03/2014    CREATININE 1.2 04/02/2014    CREATININE 1.4 04/01/2014     BNP: No results found for: BNP  PT/INR:   Lab Results   Component Value Date    PROTIME 16.1 04/02/2014    PROTIME 14.9 01/10/2014    PROTIME 12.7 11/19/2011    INR 1.40  04/02/2014    INR 1.30 01/10/2014    INR 1.14 11/19/2011    INR 1.17 05/14/2010    INR 1.18 02/13/2010    INR 1.15 10/17/2009     FASTING LIPID PANEL:  Lab Results   Component Value Date    CHOL 101 04/02/2014    HDL 22 04/02/2014    HDL 59 12/29/2009    TRIG 83 04/02/2014       Assessment:    1. Chronic systolic CHF (congestive heart failure) ([place])    2. Non-ischemic cardiomyopathy ([place])    3. Cirrhosis, nonalcoholic ([place])    4. Alpha-1-antitrypsin deficiency          Plan:   1. No change in medications  2. Continue to weigh daily. Baseline weight 190 lbs. Call if you gain or loose 3 lbs in a day or 5 lbs in a week - [phone]  3. Check blood work (BMP, BNP)  4. Follow up in 4 weeks    I appreciate the opportunity of cooperating in the care of this individual.    Glendon Axe, CNP, 04/18/2014, 10:14 AM

## 2014-04-18 NOTE — Patient Instructions (Signed)
1. No change in medications  2. Continue to weigh daily. Baseline weight 190 lbs. Call if you gain or loose 3 lbs in a day or 5 lbs in a week - [phone]  3. Check blood work (BMP, BNP)  4. Follow up in 4 weeks

## 2014-04-22 NOTE — Telephone Encounter (Signed)
Received notification from Cardiac Rehab that patient's weight had been increasing while there. Phoned patient. Weight today at home was 195.4 lb. Weight a week ago was 190.3 lb. Reviewed with Hoover Browns NP, patient to take 20 mg lasix today. May also take an additional dose tomorrow if he does not see an improvement in his weight. Asked to call back if he does not see any improvement. Relayed to patient. States understanding.  Attempted to reach spouse on cell, no answer, unable to leave message.

## 2014-04-26 MED ORDER — MAGNESIUM OXIDE 400 (241.3 MG) MG PO TABS
400 (241.3 Mg) MG | ORAL_TABLET | ORAL | Status: DC
Start: 2014-04-26 — End: 2014-09-19

## 2014-04-26 NOTE — Telephone Encounter (Signed)
Script already sent to his pharmacy.

## 2014-04-26 NOTE — Telephone Encounter (Signed)
Patient called the Pharmacy for a refill of:  magnesium oxide (MAGOX 400) 400 (241.3 MG) MG TABS tablet  Which had No refills remaining, and was told to call our office and this would expedite the process.  Patient states that he needs this called in today to:  [facility], [place] [phone] - F [phone]  Patient made aware to allow 24 to 48 business hours for prescription refills.  Please verify with patient @ phone # provided.

## 2014-05-01 ENCOUNTER — Inpatient Hospital Stay: Admit: 2014-05-01 | Discharge: 2014-05-01 | Attending: Emergency Medicine

## 2014-05-01 ENCOUNTER — Emergency Department: Admit: 2014-05-01 | Primary: Internal Medicine

## 2014-05-01 DIAGNOSIS — I5022 Chronic systolic (congestive) heart failure: Secondary | ICD-10-CM

## 2014-05-01 LAB — CBC WITH AUTO DIFFERENTIAL
Basophils %: 0.5 %
Basophils Absolute: 0 10*3/uL (ref 0.0–0.2)
Eosinophils %: 2.9 %
Eosinophils Absolute: 0.1 10*3/uL (ref 0.0–0.6)
Hematocrit: 24.9 % — ABNORMAL LOW (ref 40.5–52.5)
Hemoglobin: 8.4 g/dL — ABNORMAL LOW (ref 13.5–17.5)
Lymphocytes %: 49.7 %
Lymphocytes Absolute: 1.1 10*3/uL (ref 1.0–5.1)
MCH: 40 pg — ABNORMAL HIGH (ref 26.0–34.0)
MCHC: 33.6 g/dL (ref 31.0–36.0)
MCV: 118.8 fL — ABNORMAL HIGH (ref 80.0–100.0)
MPV: 8.6 fL (ref 5.0–10.5)
Monocytes %: 9 %
Monocytes Absolute: 0.2 10*3/uL (ref 0.0–1.3)
Neutrophils %: 37.9 %
Neutrophils Absolute: 0.8 10*3/uL — CL (ref 1.7–7.7)
PLATELET SLIDE REVIEW: DECREASED
Platelets: 64 10*3/uL — ABNORMAL LOW (ref 135–450)
RBC: 2.1 M/uL — ABNORMAL LOW (ref 4.20–5.90)
RDW: 21 % — ABNORMAL HIGH (ref 12.4–15.4)
WBC: 2.2 10*3/uL — ABNORMAL LOW (ref 4.0–11.0)

## 2014-05-01 LAB — BRAIN NATRIURETIC PEPTIDE: Pro-BNP: 1010 pg/mL — ABNORMAL HIGH (ref 0–124)

## 2014-05-01 LAB — BASIC METABOLIC PANEL
Anion Gap: 3 (ref 3–16)
BUN: 15 mg/dL (ref 7–20)
CO2: 28 mmol/L (ref 21–32)
Calcium: 8.7 mg/dL (ref 8.3–10.6)
Chloride: 110 mmol/L (ref 99–110)
Creatinine: 0.8 mg/dL (ref 0.8–1.3)
GFR African American: >60 (ref >60)
GFR Non-African American: >60 (ref >60)
Glucose: 87 mg/dL (ref 70–99)
Potassium: 4.5 mmol/L (ref 3.5–5.1)
Sodium: 141 mmol/L (ref 136–145)

## 2014-05-01 LAB — TROPONIN: Troponin: 0.02 ng/mL — ABNORMAL HIGH (ref <0.01)

## 2014-05-01 NOTE — ED Provider Notes (Signed)
Triage Chief Complaint:   Shortness of Breath    HOPI:  Andrew [name] is a 74 y.o. male that presents with fatigue and shortness of breath. Patient also reports a persistent nonproductive cough. Patient was sent from cardiac rehab for the reported shortness of breath. Patient however states that he has had some nonproductive cough and some shortness breath with exertion for several weeks.  Three days ago he was told by his doctor to stop his Lasix.  Patient was tachycardic rehab when the staff asked him how he was feeling.  He mentioned a cough and shortness of breath he was sent for further evaluation.  Patient denies any chest pain.  He has a history of cirrhosis and congestive heart failure.  Patient was medically 3 weeks ago for low blood pressure.  His medications have been changed including discontinuation of some of his blood pressure medication.  He denies any headedness, chest pressure, or or chills.  His wife states that his legs are slightly more swollen of late.    ROS:  At least 10 systems reviewed and otherwise acutely negative except as in the [place].    Past Medical History   Diagnosis Date   ??? Hypertension    ??? Kidney stones    ??? Alpha 1 antitrypsin deficiency    ??? Thrombocytopenia ([place]) 04/30/2009   ??? BPH (benign prostatic hyperplasia)    ??? 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 Chronic systolic CHF (congestive heart failure) ([place]) 10/18/2013   ??? CAD (coronary artery disease)      Non-obstructive   ??? Compression fracture of lumbar vertebra ([place])      multiple, lumbar     Past Surgical History   Procedure Laterality Date   ??? Av fistula repair  1970's   ??? Colonoscopy  4/12     5y   ??? Cardiac catheterization     ??? Eye surgery  04/12/2014     Laser Eye Surgery for glaucoma     Family History   Problem Relation Age of Onset   ??? Diabetes Mother    ??? Diabetes Sister    ??? Diabetes Brother      History     Social History   ??? Marital Status: Married     Spouse Name: N/A     Number of  Children: N/A   ??? Years of Education: N/A     Occupational History   ??? Not on file.     Social History Main Topics   ??? Smoking status: Never Smoker    ??? Smokeless tobacco: Never Used      Comment: Passive smoke exposure: yes   ??? Alcohol Use: No   ??? Drug Use: No   ??? Sexual Activity: Not on file     Other Topics Concern   ??? Not on file     Social History Narrative     No current facility-administered medications for this encounter.     Current Outpatient Prescriptions   Medication Sig Dispense Refill   ??? magnesium oxide (MAG-OX) 400 (241.3 MG) MG TABS tablet TAKE ONE TABLET BY MOUTH TWICE DAILY 60 tablet 5   ??? lisinopril (PRINIVIL;ZESTRIL) 5 MG tablet Take 1 tablet by mouth nightly 5 mg in AM and 10 mg in PM (Patient taking differently: Take 5 mg by mouth daily ) 90 tablet 3   ??? carvedilol (COREG) 6.25 MG tablet Take 1 tablet by mouth 2 times  daily (with meals) 60 tablet 3   ??? traMADol (ULTRAM) 50 MG tablet Take 1 tablet by mouth every 12 hours as needed for Pain 60 tablet 0   ??? lactulose 20 GM/30ML SOLN Take 30 mLs by mouth 2 times daily 5400 mL 1   ??? Cholecalciferol (VITAMIN D3) 1000 UNITS CAPS Take by mouth daily      ??? MILK THISTLE PO Take  by mouth.     ??? rifaximin (XIFAXAN) 550 MG tablet Take 550 mg by mouth 2 times daily.       ??? Multiple Vitamins-Minerals (CENTRUM SILVER PO) Take  by mouth daily.       No Known Allergies    Nursing Notes Reviewed    Physical Exam:  ED Triage Vitals   Enc Vitals Group      BP 05/01/14 1606 122/70 mmHg      Pulse 05/01/14 1606 65      Resp 05/01/14 1606 14      Temp 05/01/14 1606 99 ??F (37.2 ??C)      Temp Source 05/01/14 1606 Oral      SpO2 05/01/14 1606 95 %      Weight 05/01/14 1606 196 lb (88.905 kg)      Height 05/01/14 1606 5\' 5"  (1.651 m)      Head Cir --       Peak Flow --       Pain Score --       Pain Loc --       Pain Edu? --       Excl. in [place]? --      GENERAL APPEARANCE: Awake and alert. Cooperative. No acute distress.   HEAD: Normocephalic. Atraumatic.  EYES: EOM's  grossly intact. Sclera anicteric.  ENT: Mucous membranes are moist. Tolerates saliva. No trismus.  NECK: Supple. No meningismus. Trachea midline. JVDs appreciated  HEART: RRR. Radial pulses 2+.  LUNGS: Respirations unlabored. CTAB  ABDOMEN: Soft. Non-tender. No guarding or rebound.  EXTREMITIES: No acute deformities. + 2 pitting edema in legs. Tense tenderness.  Calves are symmetric.  SKIN: Warm and dry. Skin is slightly pale  NEUROLOGICAL: No gross facial drooping. Moves all 4 extremities spontaneously.  PSYCHIATRIC: Normal mood.    I have reviewed and interpreted all of the currently available lab results from this visit (if applicable):  Results for orders placed or performed during the hospital encounter of 05/01/14   CBC auto differential   Result Value Ref Range    WBC 2.2 (L) 4.0 - 11.0 K/uL    RBC 2.10 (L) 4.20 - 5.90 M/uL    Hemoglobin 8.4 (L) 13.5 - 17.5 g/dL    Hematocrit 24.9 (L) 40.5 - 52.5 %    MCV 118.8 (H) 80.0 - 100.0 fL    MCH 40.0 (H) 26.0 - 34.0 pg    MCHC 33.6 31.0 - 36.0 g/dL    RDW 21.0 (H) 12.4 - 15.4 %    Platelets 64 (L) 135 - 450 K/uL    MPV 8.6 5.0 - 10.5 fL   Brain natriuretic peptide   Result Value Ref Range    Pro-BNP 1010 (H) 0 - 124 pg/mL   Basic Metabolic Panel   Result Value Ref Range    Sodium 141 136 - 145 mmol/L    Potassium 4.5 3.5 - 5.1 mmol/L    Chloride 110 99 - 110 mmol/L    CO2 28 21 - 32 mmol/L    Anion Gap 3 3 - 16  Glucose 87 70 - 99 mg/dL    BUN 15 7 - 20 mg/dL    CREATININE 0.8 0.8 - 1.3 mg/dL    GFR Non-African American >60 >60    GFR African American >60 >60    Calcium 8.7 8.3 - 10.6 mg/dL   Troponin   Result Value Ref Range    Troponin 0.02 (H) <0.01 ng/mL      Radiographs (if obtained):  []  The following radiograph was interpreted by myself in the absence of a radiologist:  [x]  Radiologist's Report Reviewed:      XR Chest Standard TWO VW (Final result) Result time: 05/01/14 16:47:49    Final result by Vallery Sa, MD (05/01/14 16:47:49)    Impression:      IMPRESSION:  Small pleural effusions. Bibasilar atelectasis       Narrative:     EXAMINATION:  TWO VIEWS OF THE CHEST    05/01/2014 4:38 pm    COMPARISON:  04/01/2014, 11/23/2013    HISTORY:  sob    FINDINGS:  Small pleural effusions a demonstrated with bibasilar atelectasis. Chronic  compression fractures are seen in the lower thoracic spine. Cardiac  silhouette at the upper limits of normal.      EKG (if obtained): (All EKG's are interpreted by myself in the absence of a cardiologist)  G obtained at 1611 shows sinus bradycardia.  Heart rate of 58 bpm.  Normal axis.  Low voltage QRS.  No acute ST elevations.  T waves are flattened in the lateral leads seen on previous EKG.  QT intervals normal.  No new ischemic changes compared to prior EKG    MDM:  Should shortness breath with exertion, cough most likely is multifactorial.  Patient also reports some low energy.  Patient however noted to be chronically anemic.  He has history of thrombocytopenia.  The pain is seen on today's laboratories consistent with previous workups.  Due to his congestive heart failure and recent discontinuation of Lasix patient may be occasionally some further pleural effusion.  The shortness breasts most like secondary to anemia, bradycardia, pleural effusion, deconditioning.  No evidence of any renal failure or ischemic changes on EKG. Patient will be restarted back on his Lasix.  Patient was started today at 20 mg.  Cardiology consulted and they know about his recent weight gain and discontinuation of Lasix.    Conclusions    Summary  -Limited 2D echocardiogram to evaluate ejection fraction.  -The left ventricular systolic function is mildly reduced with an ejection  fraction of 45%.    Signature    ------------------------------------------------------------------  Electronically signed by Darlyn Chamber MD, [facility] (Interpreting  physician) on 01/16/2014 at 06:14  PM  ------------------------------------------------------------------    Findings    Left Ventricle  The left ventricular systolic function is mildly reduced with an ejection  fraction of 45%.    Clinical Impression:  1. Chronic systolic congestive heart failure ([place])    2. 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 Pleural effusion      (Please note that portions of this note may have been completed with a voice recognition program. Efforts were made to edit the dictations but occasionally words are mis-transcribed.)    Barnabas Lister C.W. Elba Barman, MD       Dalphine Handing. Elba Barman, MD  05/01/14 Valerie Roys

## 2014-05-01 NOTE — ED Notes (Signed)
Pt discharge to home in stable condition via private car.  Discharge instructions reviewed with patient.   Patient verbalized understanding.   All belongings in tow including discharge paperwork.  Pt walked to discharge office.        Kara Dies, RN  05/01/14 [phone]

## 2014-05-01 NOTE — Telephone Encounter (Signed)
Santiago Glad from [place] called to let physician know, that the patient came in today with SOB, his weight was down and he had some swelling and his chest was full of crackles.  She could not get a hold of the cardiology dept.  She was advised by physician to send him to the ER.

## 2014-05-02 ENCOUNTER — Encounter: Attending: Adult Health | Primary: Internal Medicine

## 2014-05-02 LAB — EKG 12-LEAD
Atrial Rate: 58 {beats}/min
P Axis: 67 degrees
P-R Interval: 144 ms
Q-T Interval: 424 ms
QRS Duration: 96 ms
QTc Calculation (Bazett): 416 ms
R Axis: 76 degrees
T Axis: -14 degrees
Ventricular Rate: 58 {beats}/min

## 2014-05-02 NOTE — Telephone Encounter (Signed)
Followup phone call to ED visit 05/01/14    Patient will come in to see NPSR 05/06/14

## 2014-05-03 ENCOUNTER — Encounter: Attending: Internal Medicine | Primary: Internal Medicine

## 2014-05-03 ENCOUNTER — Ambulatory Visit: Admit: 2014-05-03 | Discharge: 2014-05-03 | Payer: MEDICARE | Attending: Internal Medicine | Primary: Internal Medicine

## 2014-05-03 DIAGNOSIS — Z01818 Encounter for other preprocedural examination: Secondary | ICD-10-CM

## 2014-05-03 MED ORDER — DEXTROMETHORPHAN HBR 15 MG/5ML PO SYRP
15 MG/5ML | Freq: Four times a day (QID) | ORAL | Status: DC | PRN
Start: 2014-05-03 — End: 2014-05-14

## 2014-05-03 MED ORDER — FLUTICASONE-SALMETEROL 250-50 MCG/DOSE IN AEPB
250-50 MCG/DOSE | Freq: Two times a day (BID) | RESPIRATORY_TRACT | Status: DC
Start: 2014-05-03 — End: 2014-05-14

## 2014-05-03 NOTE — Progress Notes (Signed)
[facility]  Internal Medicine  Patient Encounter  Alecia Lemming, D.O., [facility]          Chief Complaint   Patient presents with   ??? Pre-op Exam       HPI-- 74 y.o. male presents today for a preoperative physical.  Pt diagnosed with OU Entropion.  He has also been found to have a fixed pupil. 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   Pt has been having worsening irritation of both eyes.  He states that it feels like there is sand in his eyes.   Pt is now scheduled to have OU lateral canthoplasty, BL lower lid entropion repair.   I have been asked to see patient for pre-operative risk assessment and clearance.  He was recently sent to the ER from cardiac rehab with SOB and a cough. In the ER, the ER physician though he was fluid overloaded.  His Lasix had been stopped due to low BP, but the lasix was restarted.  Pt also has known anemia.  His Hgb was 8.4 2/24.   He will see Cardiology CNP on Monday.  Surgery scheduled for 05/08/2014 by Dr. Fredda Hammed.        Medical/Surgical Histories     Past Medical History   Diagnosis Date   ??? Hypertension    ??? Kidney stones    ??? Alpha 1 antitrypsin deficiency    ??? Thrombocytopenia ([place]) 04/30/2009   ??? BPH (benign prostatic hyperplasia)    ??? 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 Chronic systolic CHF (congestive heart failure) ([place]) 10/18/2013   ??? CAD (coronary artery disease)      Non-obstructive   ??? Compression fracture of lumbar vertebra ([facility])      multiple, lumbar      No prior H/O DVT, PE or bleeding dyscrasias    Past Surgical History   Procedure Laterality Date   ??? Av fistula repair  1970's   ??? Colonoscopy  4/12     5y   ??? Cardiac catheterization     ??? Eye surgery  04/12/2014     Laser Eye Surgery for glaucoma           Medications/Allergies     Current Outpatient Prescriptions   Medication Sig Dispense Refill   ??? pilocarpine (PILOCAR) 1 % ophthalmic solution 1 drop 2 times daily     ??? Brimonidine Tartrate (ALPHAGAN P OP)  Apply to eye 2 times daily     ??? magnesium oxide (MAG-OX) 400 (241.3 MG) MG TABS tablet TAKE ONE TABLET BY MOUTH TWICE DAILY 60 tablet 5   ??? lisinopril (PRINIVIL;ZESTRIL) 5 MG tablet Take 1 tablet by mouth nightly 5 mg in AM and 10 mg in PM (Patient taking differently: Take 5 mg by mouth daily ) 90 tablet 3   ??? carvedilol (COREG) 6.25 MG tablet Take 1 tablet by mouth 2 times daily (with meals) 60 tablet 3   ??? traMADol (ULTRAM) 50 MG tablet Take 1 tablet by mouth every 12 hours as needed for Pain 60 tablet 0   ??? lactulose 20 GM/30ML SOLN Take 30 mLs by mouth 2 times daily 5400 mL 1   ??? Cholecalciferol (VITAMIN D3) 1000 UNITS CAPS Take by mouth daily      ??? MILK THISTLE PO Take  by mouth.     ??? rifaximin (XIFAXAN) 550 MG tablet Take 550 mg by mouth 2 times daily.       ???  Multiple Vitamins-Minerals (CENTRUM SILVER PO) Take  by mouth daily.           No Known Allergies      Substance Use History     History   Substance Use Topics   ??? Smoking status: Never Smoker    ??? Smokeless tobacco: Never Used      Comment: Passive smoke exposure: yes   ??? Alcohol Use: No          Family History     Family History   Problem Relation Age of Onset   ??? Diabetes Mother    ??? Diabetes Sister    ??? Diabetes Brother         No family history of problems with general anesthesia, venous thrombosis, or bleeding dyscrasias.      REVIEW OF SYSTEMS:    CONSTITUTIONAL:  Neg   Recent weight changes, fatigue, fever, chills or night sweats, anorexia, Sleep difficulties  EYES: As per HPI  EARS:  Neg Hearing loss, tinnitus, vertigo, discharge or otalgia.  NOSE:  Neg  Rhinorrhea, sneezing, itching, allergy, epistaxis, or snoring  MOUTH/THROAT:  Neg Bleeding gums, hoarseness, sore throat, dysphagia, throat infections, or dentures  RESPIRATORY:  Neg SOB ,wheeze, cough, sputum, hemoptysis. No report of + TB test.    CARDIOVASCULAR:  Neg Chest pain, palpitations, heart murmur, orthopnea, paroxysmal nocturnal dyspnea,  or claudication.  + LE edema, + Dyspnea on  exertion.    GASTROINTESTINAL:  Neg   Nausea, vomiting, constipation hematemesis, heart burn, dysphagia,change in bowel movements or stool caliber, hematochezia, melena, abdominal pain.  + Diarrhea due to Lactulose.  + Cirrhosis due to NASH.  Also has Alpha-1 antitrypsin, but the latter is not the culprit for cirrhosis  GENITOURINARY:  Neg  Urinary frequency, hesitancy, urgency, polyuria, dysuria, hematuria  HEMATOLOGIC/LYMPHATIC:  + Anemia, Low WBC, Low Platelet count (Cirrhosis)  NEUROLOGICAL:  Neg  Loss of Consciousness, memeory loss or forgetfulness, confusion, difficulty concentrating, seizures, insomina, aphasia or dysarthria unilateral weakness or paresthesias, ataxia, headaches, syncope, tremor  PSYCHIATRIC:  Neg  Depression, anxiety.  + Forgetful.    SKIN :  Neg  Rash.  + Itching  ENDOCRINE:  Neg  Polydipsia,polyuria,abnormal weight changes,heat /cold intolerance, Hair changes.  No H/O Diabetes or Thyroid disease.       Physical Exam    BP 110/62 mmHg   Pulse 68   Resp 12   Ht 5\' 5"  (1.651 m)   Wt 199 lb (90.266 kg)   BMI 33.12 kg/m2   Wt Readings from Last 3 Encounters:   05/03/14 199 lb (90.266 kg)   05/01/14 196 lb (88.905 kg)   04/18/14 196 lb (88.905 kg)       GEN:  74 y.o. male who is in NAD, A&O.  He appears stated age and well nourished.  He is cooperative and pleasant.    HEAD:  NC/AT, no lesions.  EYES: EOMI, No scleral icterus or conjunctival injection or discharge.  Visual fields in tact to confrontation.  OU entropion.  OD fixed pupil  EARS:  EAC's clear, TM's normal.  NOSE:  Nasal cavity is clear.  No mucosal congestion or discharge.  Sinuses are nontender.  MOUTH & THROAT:  Oral cavity is clear without mucosal lesions.  Tongue is midline.  Dentition is in good repair.  No pharyngeal erythema or exudate.  NECK:  Supple.  Full ROM.  Trachea is midline.  + Increased JVP.  No thyromegaly or nodules.  No masses  LYMPH: No  C/SC/A/F nodes  CARDIAC:  S1S2 NL.  Regular rhythm.   No murmur/clicks/rubs.   + Ectopy, frequent.    VASC:  Pedal pulses 2/4.  Carotid upstrokes 2+.  No bruits noted.    PULM:  Lungs are CTA.  + Forced expiratory wheezing and scattered rhonchi.    GI:  Abdomen is soft and nontender.  No distension.  No organomegaly.  No masses.  No pulsatile masses.    GU: Deferred at this time  EXT:  2+-3+ Pitting edema.    SKIN: Warm and dry, normal turgor, no rash or lesions of concern.  NEURO:  Cranial nerves 2-12 are NL.  Speech fluent and coherent.  Strength is 5/5 in all muscle groups.  No sensory deficits.  No focal or lateralizing deficits.  Reflexes 2/4 and symmetric.  Gait is normal.  MS:  No C/T/L paraspinal tenderness.  No scoliosis.  No joint effusions.  Full joint ROM.    PSYCH:  Mood and affect NL.  Judgement and insight NL.          Encounter Diagnoses   Name Primary?   ??? Preop exam for internal medicine Yes   ??? Entropion, unspecified laterality    ??? Glaucoma    ??? Chronic systolic CHF (congestive heart failure) ([place])    ??? Normocytic anemia    ??? Thrombocytopenia ([place])    ??? Leukopenia, unspecified type    ??? Cirrhosis, nonalcoholic ([place])    ??? Bronchitis    ??? Wheezing        Plan:  Acceptable risk for the planned procedure from medical Standpoint.  Needs to see cardiology for additional cardiac clearance.      May need transfusion to maximize oxygen carrying capacity and SOB, but does not need this prior to surgery    Likely needs more Lasix.  He will see cardiologist Monday to navigate his diuretic treatment    Continue daily weights.      Lab drawn    Pt will avoid NSAID's, OTC vitamin supplements and fish oil 1 week prior to procedure      Start Advair 250/50 1 inh BID  Robitussin for cough          Electronically Signed:  Alecia Lemming, D.O      Copy of H&P given to pt and sent to [facility]

## 2014-05-04 LAB — CBC WITH AUTO DIFFERENTIAL
Bands Relative: 2 % (ref 0–7)
Basophils %: 0 %
Basophils Absolute: 0 10*3/uL (ref 0.0–0.2)
Eosinophils %: 2 %
Eosinophils Absolute: 0.1 10*3/uL (ref 0.0–0.6)
Hematocrit: 25.5 % — ABNORMAL LOW (ref 40.5–52.5)
Hemoglobin: 8.7 g/dL — ABNORMAL LOW (ref 13.5–17.5)
Lymphocytes %: 56 %
Lymphocytes Absolute: 1.5 10*3/uL (ref 1.0–5.1)
MCH: 39.8 pg — ABNORMAL HIGH (ref 26.0–34.0)
MCHC: 34 g/dL (ref 31.0–36.0)
MCV: 117.1 fL — ABNORMAL HIGH (ref 80.0–100.0)
MPV: 10.2 fL (ref 5.0–10.5)
Metamyelocytes Relative: 1 % — AB
Monocytes %: 9 %
Monocytes Absolute: 0.2 10*3/uL (ref 0.0–1.3)
Myelocyte Percent: 1 % — AB
Neutrophils %: 29 %
Neutrophils Absolute: 0.9 10*3/uL — CL (ref 1.7–7.7)
PLATELET SLIDE REVIEW: DECREASED
Platelets: 69 10*3/uL — ABNORMAL LOW (ref 135–450)
RBC: 2.18 M/uL — ABNORMAL LOW (ref 4.20–5.90)
RDW: 20.6 % — ABNORMAL HIGH (ref 12.4–15.4)
WBC: 2.6 10*3/uL — ABNORMAL LOW (ref 4.0–11.0)

## 2014-05-04 LAB — COMPREHENSIVE METABOLIC PANEL
ALT: 28 U/L (ref 10–40)
AST: 53 U/L — ABNORMAL HIGH (ref 15–37)
Albumin/Globulin Ratio: 0.6 — ABNORMAL LOW (ref 1.1–2.2)
Albumin: 2.2 g/dL — ABNORMAL LOW (ref 3.4–5.0)
Alkaline Phosphatase: 132 U/L — ABNORMAL HIGH (ref 40–129)
Anion Gap: 7 (ref 3–16)
BUN: 13 mg/dL (ref 7–20)
CO2: 27 mmol/L (ref 21–32)
Calcium: 8.3 mg/dL (ref 8.3–10.6)
Chloride: 107 mmol/L (ref 99–110)
Creatinine: 0.7 mg/dL — ABNORMAL LOW (ref 0.8–1.3)
GFR African American: >60 (ref >60)
GFR Non-African American: >60 (ref >60)
Globulin: 3.9 g/dL
Glucose: 110 mg/dL — ABNORMAL HIGH (ref 70–99)
Potassium: 4.8 mmol/L (ref 3.5–5.1)
Sodium: 141 mmol/L (ref 136–145)
Total Bilirubin: 1.3 mg/dL — ABNORMAL HIGH (ref 0.0–1.0)
Total Protein: 6.1 g/dL — ABNORMAL LOW (ref 6.4–8.2)

## 2014-05-04 LAB — IRON AND TIBC: Iron: 148 ug/dL (ref 59–158)

## 2014-05-04 LAB — PERIPHERAL BLOOD SMEAR, PATH REVIEW

## 2014-05-04 LAB — FERRITIN: Ferritin: 781.6 ng/mL — ABNORMAL HIGH (ref 30.0–400.0)

## 2014-05-06 ENCOUNTER — Ambulatory Visit: Admit: 2014-05-06 | Discharge: 2014-05-06 | Payer: MEDICARE | Attending: Nurse Practitioner | Primary: Internal Medicine

## 2014-05-06 DIAGNOSIS — I5022 Chronic systolic (congestive) heart failure: Secondary | ICD-10-CM

## 2014-05-06 MED ORDER — LISINOPRIL 5 MG PO TABS
5 MG | ORAL_TABLET | Freq: Every evening | ORAL | Status: DC
Start: 2014-05-06 — End: 2014-05-21

## 2014-05-06 NOTE — Progress Notes (Signed)
Andrew [name]   Cardiac Evaluation    Primary Care Doctor:  Alecia Lemming, DO    Chief Complaint   Patient presents with   ??? Congestive Heart Failure   ??? Shortness of Breath   ??? Cough        History of Present Illness:   I had the pleasure of seeing Andrew [name] in follow up for sCHF, CM, HTN, and cirrhosis. Following admission in early February lisinopril was decreased to 5 mg HS, lasix was changed to PRN and spironolactone was stopped secondary to gynecomastia. At last visit, weight remained down from discharge so lasix was not restarted. He was sent to the ED for evaluation from Cardiac Rehab. His weight was up 9 lbs with increased shortness of breath.  He was discharged with instructions to restart lasix daily. Today his weight is 191 lbs, down 8 since medication change. His shortness of breath is at baseline. He denies orthopnea or PND. Edema is improving. Wednesday he is scheduled for eyelid repair, cataract surgery to follow at later date.        Andrew [name] describes symptoms including dyspnea, fatigue but denies orthopnea, PND.     Recent Hospitalization or Testing:   Echo 04/02/14:  -Limited bedside 2D echocardiogram was performed to evaluate EF.   -Left ventricular size is mildly increased .   -Left ventricular function is low normal with ejection fraction estimated at 50%.   -There is a question of mild inferior hypokinesis.   -There is mild mitral and trivial pulmonic and tricuspid regurgitation with RVSP estimated at 27 mmHg.   -Right heart size is borderline dilated .   -Dilated left atrium with a volume of 37ml.  Echo 01/16/14:  -Limited 2D echocardiogram to evaluate ejection fraction.   -The left ventricular systolic function is mildly reduced with an ejection fraction of 45%  Cardiac Cath 10/03/13:  Anatomy:   LM-normal   LAD-50% mid with FFR of 0.79  Cx-normal, dominant  OM1- normal  RCA-normal  LVEF- 30%    Hemodynamics:  RA- mean 12  RV- 37  PAWP - mean 19    PASP- 40 mmHg  C.O.  5.6    Impression:  1.  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  2.  Moderate to severe left ventricular systolic dysfunction with estimated ejection fraction of 30%.    Echo 09/05/2013--  Dilated left ventricle. Normal wall thickness. Decreased left ventricular systolic function with an estimated EF 30-35%.Inferior wall hypokinesis E/e=25. Mild to moderate mitral regurgitation. The left atrium is dilated. Trace of aortic insufficiency. Mild tricuspid regurgitation with RVSP estimated at 36 mmHg.The aorta is within normal limits.    NYHA:  III    Past Medical History:   has a past medical history of Hypertension; Kidney stones; Alpha 1 antitrypsin deficiency; Thrombocytopenia ([place]); BPH (benign prostatic hyperplasia); Cirrhosis, nonalcoholic ([place]); Chronic systolic CHF (congestive heart failure) ([place]); CAD (coronary artery disease); Compression fracture of lumbar vertebra ([place]); Leukopenia; and Anemia.  Surgical History:   has past surgical history that includes AV fistula repair (1970's); Colonoscopy (4/12); Cardiac catheterization; and eye surgery (04/12/2014).   Social History:   reports that he has never smoked. He has never used smokeless tobacco. He reports that he does not drink alcohol or use illicit drugs.   Family History:   Family History   Problem Relation Age of Onset   ??? Diabetes Mother    ??? Diabetes Sister    ??? Diabetes Brother  Home Medications:  Prior to Admission medications    Medication Sig Start Date End Date Taking? Authorizing Provider   lisinopril (PRINIVIL;ZESTRIL) 5 MG tablet Take 1 tablet by mouth nightly 05/06/14  Yes Donata Duff, NP   pilocarpine (PILOCAR) 1 % ophthalmic solution 1 drop 2 times daily   Yes Historical Provider, MD   Brimonidine Tartrate (ALPHAGAN P OP) Apply to eye 2 times daily   Yes Historical Provider, MD   furosemide (LASIX) 20 MG tablet Take 20 mg by mouth daily   Yes Historical Provider, MD   fluticasone-salmeterol (ADVAIR DISKUS)  250-50 MCG/DOSE AEPB Inhale 1 puff into the lungs every 12 hours 05/03/14  Yes Scott A Kotzin, DO   dextromethorphan (ROBITUSSIN MAXIMUM STRENGTH) 15 MG/5ML syrup Take 10 mLs by mouth 4 times daily as needed for Cough 05/03/14  Yes Scott A Kotzin, DO   magnesium oxide (MAG-OX) 400 (241.3 MG) MG TABS tablet TAKE ONE TABLET BY MOUTH TWICE DAILY 04/26/14  Yes Scott A Kotzin, DO   carvedilol (COREG) 6.25 MG tablet Take 1 tablet by mouth 2 times daily (with meals) 03/18/14  Yes Donata Duff, NP   traMADol (ULTRAM) 50 MG tablet Take 1 tablet by mouth every 12 hours as needed for Pain 03/07/14  Yes Scott A Kotzin, DO   lactulose 20 GM/30ML SOLN Take 30 mLs by mouth 2 times daily 08/13/13  Yes Scott A Kotzin, DO   Cholecalciferol (VITAMIN D3) 1000 UNITS CAPS Take by mouth daily    Yes Historical Provider, MD   MILK THISTLE PO Take  by mouth.   Yes Historical Provider, MD   rifaximin (XIFAXAN) 550 MG tablet Take 550 mg by mouth 2 times daily.     Yes Historical Provider, MD   Multiple Vitamins-Minerals (CENTRUM SILVER PO) Take  by mouth daily. 08/23/08  Yes Historical Provider, MD        Allergies:  Review of patient's allergies indicates no known allergies.     Review of Systems:   ?? Constitutional: there has been no unanticipated weight loss. There's been no change in energy level, sleep pattern, or activity level.     ?? Eyes: No visual changes or diplopia. No scleral icterus.  ?? ENT: No Headaches, hearing loss or vertigo. No mouth sores or sore throat.  ?? Cardiovascular: Reviewed in HPI  ?? Respiratory: No cough or wheezing, no sputum production. No hematemesis.    ?? Gastrointestinal: No abdominal pain, appetite loss, blood in stools. No change in bowel or bladder habits.  ?? Genitourinary: No dysuria, trouble voiding, or hematuria.  ?? Musculoskeletal:  No gait disturbance, weakness or joint complaints.  ?? Integumentary: No rash or pruritis.  ?? Neurological: No headache, diplopia, change in muscle strength, numbness  or tingling. No change in gait, balance, coordination, mood, affect, memory, mentation, behavior.  ?? Psychiatric: No anxiety, no depression.  ?? Endocrine: No malaise, fatigue or temperature intolerance. No excessive thirst, fluid intake, or urination. No tremor.  ?? Hematologic/Lymphatic: No abnormal bruising or bleeding, blood clots or swollen lymph nodes.  ?? Allergic/Immunologic: No nasal congestion or hives.    Physical Examination:    Filed Vitals:    05/06/14 1157   BP: 106/70   Pulse: 61   Weight: 194 lb 9.6 oz (88.27 kg)   SpO2: 94%        Constitutional and General Appearance: Warm and dry, no apparent distress, normal coloration  HEENT:  Normocephalic, atraumatic  Respiratory:  ?? Normal excursion and  expansion without use of accessory muscles  ?? Resp Auscultation: Normal breath sounds without dullness  Cardiovascular:  ?? The apical impulses not displaced  ?? Heart tones are crisp and normal  ?? JVP less than 8 cm H2O  ?? Regular rate and rhythm, S1, S2  ?? Peripheral pulses are symmetrical and full  ?? There is no clubbing, cyanosis of the extremities.  ?? Trace edema  ?? Pedal Pulses: 2+ and equal   Abdomen:  ?? No masses or tenderness  ?? Liver/Spleen: No Abnormalities Noted  Neurological/Psychiatric:  ?? Alert and oriented in all spheres  ?? Moves all extremities well  ?? Exhibits normal gait balance and coordination  ?? No abnormalities of mood, affect, memory, mentation, or behavior are noted    Lab Data:  CBC:   Lab Results   Component Value Date    WBC 2.6 05/03/2014    WBC 2.2 05/01/2014    WBC 2.6 04/03/2014    RBC 2.18 05/03/2014    RBC 2.10 05/01/2014    RBC 2.23 04/03/2014    HGB 8.7 05/03/2014    HGB 8.4 05/01/2014    HGB 8.8 04/03/2014    HCT 25.5 05/03/2014    HCT 24.9 05/01/2014    HCT 26.0 04/03/2014    MCV 117.1 05/03/2014    MCV 118.8 05/01/2014    MCV 116.7 04/03/2014    RDW 20.6 05/03/2014    RDW 21.0 05/01/2014    RDW 18.9 04/03/2014    PLT 69 05/03/2014    PLT 64 05/01/2014    PLT 67 04/03/2014      BMP:   Lab Results   Component Value Date    NA 141 05/03/2014    NA 141 05/01/2014    NA 140 04/18/2014    K 4.8 05/03/2014    K 4.5 05/01/2014    K 4.8 04/18/2014    CL 107 05/03/2014    CL 110 05/01/2014    CL 108 04/18/2014    CO2 27 05/03/2014    CO2 28 05/01/2014    CO2 26 04/18/2014    PHOS 2.9 04/06/2010    PHOS 3.3 10/30/2009    PHOS 2.9 10/09/2009    BUN 13 05/03/2014    BUN 15 05/01/2014    BUN 16 04/18/2014    CREATININE 0.7 05/03/2014    CREATININE 0.8 05/01/2014    CREATININE 0.9 04/18/2014     BNP:   Lab Results   Component Value Date    PROBNP 1010 05/01/2014    PROBNP 734 04/18/2014    PROBNP 864 04/03/2014     PT/INR:   Lab Results   Component Value Date    PROTIME 16.1 04/02/2014    PROTIME 14.9 01/10/2014    PROTIME 12.7 11/19/2011    INR 1.40 04/02/2014    INR 1.30 01/10/2014    INR 1.14 11/19/2011    INR 1.17 05/14/2010    INR 1.18 02/13/2010    INR 1.15 10/17/2009     FASTING LIPID PANEL:  Lab Results   Component Value Date    CHOL 101 04/02/2014    HDL 22 04/02/2014    HDL 59 12/29/2009    TRIG 83 04/02/2014       Assessment:    1. Chronic systolic CHF (congestive heart failure) ([place])    2. Non-ischemic cardiomyopathy ([place])    3. Cirrhosis, nonalcoholic ([place])    4. Thrombocytopenia          Plan:  1. No change in medications - continue taking lasix 20 mg daily  2. Call if your weight is less than 187 lbs or more than 193 lbs - [phone]  3. Keep appointment with Dr Jacqualyn Posey on 3/14    I appreciate the opportunity of cooperating in the care of this individual.    Glendon Axe, CNP, 05/06/2014, 12:25 PM

## 2014-05-06 NOTE — Patient Instructions (Signed)
1. No change in medications - continue taking lasix 20 mg daily  2. Call if your weight is less than 187 lbs or more than 193 lbs - [phone]  3. Keep appointment with Dr Jacqualyn Posey on 3/14

## 2014-05-07 NOTE — Telephone Encounter (Signed)
Cardiac clearance needs to come from MD not NP. I have forwarded request. He did not mention needing clearance when I saw him in the office.

## 2014-05-07 NOTE — Telephone Encounter (Signed)
He was stable in OV. Had been sent to the ED from Cardiac Rehab for wt gain and SOB. No treatment given in ED, restarted po lasix dose. Wt down to baseline now. Remains anemic with Hgb stable 8.7.

## 2014-05-07 NOTE — Telephone Encounter (Signed)
What kind of anesthesia is required for this?  If local, do they really need clearance?

## 2014-05-07 NOTE — Telephone Encounter (Signed)
Pt is having eye lid surgery first thing tomorrow morning 05/09/14 - need clx note faxed to [phone] ASAP - pt was seen on 05/06/14 w/ Manuela Schwartz

## 2014-05-07 NOTE — Telephone Encounter (Signed)
Dr Nelia Shi office calling again for the Cardiac Clearance for surgery in the morning.  Please send letter of clearance, or not, to [phone].  Thanks.

## 2014-05-08 NOTE — Telephone Encounter (Signed)
Called the office.  " He will have monitored anesthesia, Versed, Fentanyl, and Propafol" There will be an anesthesiologist involved. CRNA

## 2014-05-08 NOTE — Telephone Encounter (Signed)
They are requesting a copy of his last cardiac cath. Fax [phone]

## 2014-05-08 NOTE — Telephone Encounter (Signed)
The doctor's office is the one calling for the clearance.

## 2014-05-08 NOTE — Telephone Encounter (Signed)
Please advise regarding cardiac clearance.    See yesterday's message for details

## 2014-05-08 NOTE — Telephone Encounter (Signed)
Spoke with Eustaquio Maize because there is no cath for 04-12-14. Faxed risk letter and [phone] cath report.

## 2014-05-08 NOTE — Telephone Encounter (Signed)
They are requesting a copy of pt's Cardiac Cath Report from 04-12-14.  They did call the Cardiologist and he did not have it.  The anethesiologist is waiting on this please fax over [place].  Fax to [phone].

## 2014-05-08 NOTE — Telephone Encounter (Signed)
Pt's wife called to make [place] aware that the surgery is scheduled for 11 am and to request that someone call her to confirm if cardiac clearance was approved or not.

## 2014-05-08 NOTE — Telephone Encounter (Signed)
Info faxed

## 2014-05-08 NOTE — Telephone Encounter (Signed)
Yes but i need to know what kind of anesthesia is required and where is surgery being performed.  Is an anesthesiologist involved?

## 2014-05-08 NOTE — Telephone Encounter (Signed)
He is moderately high risk for any procedure.  Has heart failure and liver disease.  Not sure what they want [place] to say.  I cannot "clear" someone with these problems for surgery.  I can say that he is as stable as he can be.  Patient and physician needs to understand the risks and make decision whether or not to proceed.  LEW

## 2014-05-08 NOTE — Telephone Encounter (Signed)
Cardiac Clearance letter faxed per patient's request.

## 2014-05-14 ENCOUNTER — Ambulatory Visit: Admit: 2014-05-14 | Discharge: 2014-05-14 | Payer: MEDICARE | Attending: Internal Medicine | Primary: Internal Medicine

## 2014-05-14 DIAGNOSIS — I5022 Chronic systolic (congestive) heart failure: Secondary | ICD-10-CM

## 2014-05-14 MED ORDER — MOMETASONE FURO-FORMOTEROL FUM 200-5 MCG/ACT IN AERO
200-5 MCG/ACT | Freq: Two times a day (BID) | RESPIRATORY_TRACT | Status: DC
Start: 2014-05-14 — End: 2014-07-03

## 2014-05-14 MED ORDER — ALBUTEROL SULFATE HFA 108 (90 BASE) MCG/ACT IN AERS
108 (90 Base) MCG/ACT | Freq: Four times a day (QID) | RESPIRATORY_TRACT | Status: DC | PRN
Start: 2014-05-14 — End: 2014-07-08

## 2014-05-14 NOTE — Telephone Encounter (Signed)
Patient was prescribed cough syrup and Advair @ his last office visit with Dr. Susie Cassette.  Patient is still experiencing a cough and wheezing, and is requesting to know if Dr. Susie Cassette recommends he come in again for a reevaluation.  Please contact patient @ phone # provided.

## 2014-05-14 NOTE — Telephone Encounter (Signed)
Patient's spouse advised to bring him in at 11:30 am today. His cough and wheezing is no better.

## 2014-05-14 NOTE — Progress Notes (Signed)
[facility]  Internal Medicine  Patient Encounter  Andrew [name], D.O., [facility]        Chief Complaint   Patient presents with   ??? Cough       HPI: 74 y.o. male with H/O CHF who was recently restarted on one of his diuretics seen today C/O ongoing cough.  He had a cough at the time of pre-op 2/26.  He was diagnosed with bronchitis and wheezing.  He was given Advair and Robitussin.  Wife feels he is no better.  She states he is still wheezing.  Cough is minimally productive.  CXR 2/24--   IMPRESSION:   Small pleural effusions. Bibasilar atelectasis     Weights at home have been 191 and stable. However, his has not lost any weight since his visit on 2/26.    Wife can here his lungs rattle and wheeze at rest.  Cough is deep.      Past Medical History   Diagnosis Date   ??? Hypertension    ??? Kidney stones    ??? Alpha 1 antitrypsin deficiency    ??? Thrombocytopenia ([place]) 04/30/2009   ??? BPH (benign prostatic hyperplasia)    ??? 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 Chronic systolic CHF (congestive heart failure) ([place]) 10/18/2013   ??? CAD (coronary artery disease)      Non-obstructive   ??? Compression fracture of lumbar vertebra ([facility])      multiple, lumbar   ??? Leukopenia    ??? Anemia        Review of Systems - As per HPI      OBJECTIVE:  BP 104/60 mmHg   Pulse 64   Temp(Src) 97.9 ??F (36.6 ??C)   Resp 12   Wt 199 lb (90.266 kg)   Wt Readings from Last 3 Encounters:   05/14/14 199 lb (90.266 kg)   05/06/14 194 lb 9.6 oz (88.27 kg)   05/03/14 199 lb (90.266 kg)       GEN: NAD, A&O, Non-toxic  HEENT: NC/AT, PERL, EOMI, Oral cavity Clear,  TM's NL, Nasal cavity clear.    NECK: Supple.  No thyromegaly. + JVD.    LYMPH: No C/SC nodes.  CV: S1 S2 NL, RRR.  No murmurs, clicks or rubs.  PULM: Bibasilar crackles, forced expiratory wheezing.    EXT: BL 2+ LE edema.        ASSESSMENT::  Encounter Diagnoses   Name Primary?   ??? Chronic systolic CHF (congestive heart failure) ([facility]) Yes   ??? Reactive airways  dysfunction syndrome, mild persistent, uncomplicated        Plan:  1. Increase Lasix to 40 mg daily  2. Watch BP, dizziness, lightheadedness  3. Proventil HFA 2 puffs q 6 hours as needed for     Spoke with cardiologist CNP, Bernadette Hoit    Discussed medications with patient who voiced understanding of their use, indication and potential side effects.  Pt also understands the above recommendations.   All questions answered.

## 2014-05-20 ENCOUNTER — Ambulatory Visit
Admit: 2014-05-20 | Discharge: 2014-05-20 | Payer: MEDICARE | Attending: Cardiovascular Disease | Primary: Internal Medicine

## 2014-05-20 DIAGNOSIS — I5022 Chronic systolic (congestive) heart failure: Secondary | ICD-10-CM

## 2014-05-20 NOTE — Progress Notes (Signed)
[place]   Cardiac Follow up    Primary Care Doctor:  Alecia Lemming, DO    Chief Complaint   Patient presents with   ??? Congestive Heart Failure     No cardiac complaints        History of Present Illness:  I had the pleasure of seeing Andrew [name] in follow up for sCHF, CM, HTN, and cirrhosis. Spironolactone was stopped secondary to gynecomastia.  Had eyelid surgery. Still has swelling. Has not returned to cardiac rehab, needs clearance from the eye doctor to return. He is planning to have cataract surgery next month. Plans to continue cardiac rehab in between. Having shortness of breath similar to what he has been having. He developed a cough and Dr. Susie Cassette prescribed cough syrup and an inhaler and no change. He had a change in inhalers to [facility] and lasix increased to 40 mg. Cough is random, feels it's more of a productive nature and after the inhaler and increase in lasix, cough is better. He denies orthopnea, PND.     Recent Hospitalization or Testing:   Echo 04/02/14:  -Limited bedside 2D echocardiogram was performed to evaluate EF.   -Left ventricular size is mildly increased .   -Left ventricular function is low normal with ejection fraction estimated at 50%.   -There is a question of mild inferior hypokinesis.   -There is mild mitral and trivial pulmonic and tricuspid regurgitation with RVSP estimated at 27 mmHg.   -Right heart size is borderline dilated .   -Dilated left atrium with a volume of 66ml.  Echo 01/16/14:  -Limited 2D echocardiogram to evaluate ejection fraction.   -The left ventricular systolic function is mildly reduced with an ejection fraction of 45%  Cardiac Cath 10/03/13:  Anatomy:   LM-normal   LAD-50% mid with FFR of 0.79  Cx-normal, dominant  OM1- normal  RCA-normal  LVEF- 30%    Hemodynamics:  RA- mean 12  RV- 37  PAWP - mean 19    PASP- 40 mmHg  C.O. 5.6    Impression:  1.  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  2.  Moderate to severe left  ventricular systolic dysfunction with estimated ejection fraction of 30%.    Echo 09/05/2013--  Dilated left ventricle. Normal wall thickness. Decreased left ventricular systolic function with an estimated EF 30-35%.Inferior wall hypokinesis E/e=25. Mild to moderate mitral regurgitation. The left atrium is dilated. Trace of aortic insufficiency. Mild tricuspid regurgitation with RVSP estimated at 36 mmHg.The aorta is within normal limits.    NYHA:  III    Past Medical History:   has a past medical history of Hypertension; Kidney stones; Alpha 1 antitrypsin deficiency; Thrombocytopenia ([place]); BPH (benign prostatic hyperplasia); Cirrhosis, nonalcoholic ([place]); Chronic systolic CHF (congestive heart failure) ([place]); CAD (coronary artery disease); Compression fracture of lumbar vertebra ([place]); Leukopenia; and Anemia.  Surgical History:   has past surgical history that includes AV fistula repair (1970's); Colonoscopy (4/12); Cardiac catheterization; eye surgery (04/12/2014); and Cosmetic surgery (04/2013).   Social History:   reports that he has never smoked. He has never used smokeless tobacco. He reports that he does not drink alcohol or use illicit drugs.   Family History:   Family History   Problem Relation Age of Onset   ??? Diabetes Mother    ??? Diabetes Sister    ??? Diabetes Brother        Home Medications:    Current outpatient prescriptions:   ???  furosemide (  LASIX) 40 MG tablet, Take 1 tablet by mouth daily, Disp: 60 tablet, Rfl:   ???  albuterol sulfate HFA (PROAIR HFA) 108 (90 BASE) MCG/ACT inhaler, Inhale 2 puffs into the lungs every 6 hours as needed for Wheezing, Disp: 1 Inhaler, Rfl: 3  ???  mometasone-formoterol (DULERA) 200-5 MCG/ACT inhaler, Inhale 2 puffs into the lungs 2 times daily, Disp: 2 Inhaler, Rfl: 0  ???  lisinopril (PRINIVIL;ZESTRIL) 5 MG tablet, Take 1 tablet by mouth nightly, Disp: 90 tablet, Rfl: 3  ???  pilocarpine (PILOCAR) 1 % ophthalmic solution, 1 drop 2 times daily, Disp: , Rfl:   ???  Brimonidine  Tartrate (ALPHAGAN P OP), Apply to eye 2 times daily, Disp: , Rfl:   ???  magnesium oxide (MAG-OX) 400 (241.3 MG) MG TABS tablet, TAKE ONE TABLET BY MOUTH TWICE DAILY, Disp: 60 tablet, Rfl: 5  ???  carvedilol (COREG) 6.25 MG tablet, Take 1 tablet by mouth 2 times daily (with meals), Disp: 60 tablet, Rfl: 3  ???  traMADol (ULTRAM) 50 MG tablet, Take 1 tablet by mouth every 12 hours as needed for Pain, Disp: 60 tablet, Rfl: 0  ???  lactulose 20 GM/30ML SOLN, Take 30 mLs by mouth 2 times daily, Disp: 5400 mL, Rfl: 1  ???  Cholecalciferol (VITAMIN D3) 1000 UNITS CAPS, Take by mouth daily , Disp: , Rfl:   ???  MILK THISTLE PO, Take  by mouth., Disp: , Rfl:   ???  rifaximin (XIFAXAN) 550 MG tablet, Take 550 mg by mouth 2 times daily.  , Disp: , Rfl:   ???  Multiple Vitamins-Minerals (CENTRUM SILVER PO), Take  by mouth daily., Disp: , Rfl:        Allergies:  Review of patient's allergies indicates no known allergies.     Review of Systems:   ?? Constitutional: there has been no unanticipated weight loss. There's been no change in energy level, sleep pattern, or activity level.     ?? Eyes: No visual changes or diplopia. No scleral icterus.  ?? ENT: No Headaches, hearing loss or vertigo. No mouth sores or sore throat.  ?? Cardiovascular: Reviewed in HPI  ?? Respiratory: No cough or wheezing, no sputum production. No hematemesis.    ?? Gastrointestinal: No abdominal pain, appetite loss, blood in stools. No change in bowel or bladder habits.  ?? Genitourinary: No dysuria, trouble voiding, or hematuria.  ?? Musculoskeletal:  No gait disturbance, weakness or joint complaints.  ?? Integumentary: No rash or pruritis.  ?? Neurological: No headache, diplopia, change in muscle strength, numbness or tingling. No change in gait, balance, coordination, mood, affect, memory, mentation, behavior.  ?? Psychiatric: No anxiety, no depression.  ?? Endocrine: No malaise, fatigue or temperature intolerance. No excessive thirst, fluid intake, or urination. No  tremor.  ?? Hematologic/Lymphatic: No abnormal bruising or bleeding, blood clots or swollen lymph nodes.  ?? Allergic/Immunologic: No nasal congestion or hives.    Physical Examination:    Filed Vitals:    05/20/14 0946   BP: 102/72   Pulse: 48   Weight: 193 lb (87.544 kg)   SpO2: 94%        Constitutional and General Appearance: Warm and dry, no apparent distress, normal coloration  HEENT:  Normocephalic, atraumatic  Respiratory:  ?? Normal excursion and expansion without use of accessory muscles  ?? Resp Auscultation: Normal breath sounds without dullness  Cardiovascular:  ?? The apical impulses not displaced  ?? Heart tones are crisp and normal  ?? JVP less  than 8 cm H2O  ?? Regular rate and rhythm, S1, S2  ?? Peripheral pulses are symmetrical and full  ?? There is no clubbing, cyanosis of the extremities.  ?? Trace edema  ?? Pedal Pulses: 2+ and equal   Abdomen:  ?? No masses or tenderness  ?? Liver/Spleen: No Abnormalities Noted  Neurological/Psychiatric:  ?? Alert and oriented in all spheres  ?? Moves all extremities well  ?? Exhibits normal gait balance and coordination  ?? No abnormalities of mood, affect, memory, mentation, or behavior are noted    Lab Data:  CBC:   Lab Results   Component Value Date    WBC 2.6 05/03/2014    WBC 2.2 05/01/2014    WBC 2.6 04/03/2014    RBC 2.18 05/03/2014    RBC 2.10 05/01/2014    RBC 2.23 04/03/2014    HGB 8.7 05/03/2014    HGB 8.4 05/01/2014    HGB 8.8 04/03/2014    HCT 25.5 05/03/2014    HCT 24.9 05/01/2014    HCT 26.0 04/03/2014    MCV 117.1 05/03/2014    MCV 118.8 05/01/2014    MCV 116.7 04/03/2014    RDW 20.6 05/03/2014    RDW 21.0 05/01/2014    RDW 18.9 04/03/2014    PLT 69 05/03/2014    PLT 64 05/01/2014    PLT 67 04/03/2014     BMP:   Lab Results   Component Value Date    NA 141 05/03/2014    NA 141 05/01/2014    NA 140 04/18/2014    K 4.8 05/03/2014    K 4.5 05/01/2014    K 4.8 04/18/2014    CL 107 05/03/2014    CL 110 05/01/2014    CL 108 04/18/2014    CO2 27 05/03/2014    CO2 28  05/01/2014    CO2 26 04/18/2014    PHOS 2.9 04/06/2010    PHOS 3.3 10/30/2009    PHOS 2.9 10/09/2009    BUN 13 05/03/2014    BUN 15 05/01/2014    BUN 16 04/18/2014    CREATININE 0.7 05/03/2014    CREATININE 0.8 05/01/2014    CREATININE 0.9 04/18/2014     BNP:   Lab Results   Component Value Date    PROBNP 1010 05/01/2014    PROBNP 734 04/18/2014    PROBNP 864 04/03/2014     PT/INR:   Lab Results   Component Value Date    PROTIME 16.1 04/02/2014    PROTIME 14.9 01/10/2014    PROTIME 12.7 11/19/2011    INR 1.40 04/02/2014    INR 1.30 01/10/2014    INR 1.14 11/19/2011    INR 1.17 05/14/2010    INR 1.18 02/13/2010    INR 1.15 10/17/2009     FASTING LIPID PANEL:  Lab Results   Component Value Date    CHOL 101 04/02/2014    HDL 22 04/02/2014    HDL 59 12/29/2009    TRIG 83 04/02/2014       Assessment:    Chronic systolic CHF (congestive heart failure) (HCC)--exam today suggest may have been holding some fluid. Will repeat bnp in 1 month.  Cirrhosis, nonalcoholic (HCC)--Stable . Managed by liver doctor.   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  Anemia, unspecified type--has Thrombocytopenia H/H 8.4/24.9 has been ~ his baseline this month. Managed by Dr.Kotzin       Plan:   1. No change in medications.  2. Call if your weight is less than 187 lbs or more than 193 lbs - [phone]  3. Labs bmp and bnp in 1 month.  4. Return in 3 month.     I appreciate the opportunity of cooperating in the care of this individual.    Marissa Nestle, M.D., [place]

## 2014-05-20 NOTE — Patient Instructions (Addendum)
1. No change in medications.  2. Call if your weight is less than 187 lbs or more than 193 lbs - [phone]  3. Labs bmp and bnp in 1 month.  4. Return in 3 month.

## 2014-05-21 MED ORDER — LISINOPRIL 5 MG PO TABS
5 MG | ORAL_TABLET | Freq: Every evening | ORAL | Status: DC
Start: 2014-05-21 — End: 2014-08-06

## 2014-05-21 NOTE — Telephone Encounter (Signed)
From: [name]  To: Donata Duff, NP  Sent: 05/18/2014 5:46 PM EST  Subject: Medication Renewal Request    Original authorizing provider: Glendon Axe, NP    [name] would like a refill of the following medications:  lisinopril (PRINIVIL;ZESTRIL) 5 MG tablet [Jennifer Bernadette Hoit, NP]    Preferred pharmacy: [facility], [place] [phone] - F [phone]    Comment:

## 2014-06-03 ENCOUNTER — Inpatient Hospital Stay: Attending: Internal Medicine | Primary: Internal Medicine

## 2014-06-03 ENCOUNTER — Ambulatory Visit: Admit: 2014-06-03 | Discharge: 2014-06-03 | Payer: MEDICARE | Attending: Internal Medicine | Primary: Internal Medicine

## 2014-06-03 ENCOUNTER — Encounter

## 2014-06-03 ENCOUNTER — Encounter: Admit: 2014-06-03 | Primary: Internal Medicine

## 2014-06-03 DIAGNOSIS — R053 Chronic cough: Secondary | ICD-10-CM

## 2014-06-03 MED ORDER — MOXIFLOXACIN HCL 400 MG PO TABS
400 MG | ORAL_TABLET | Freq: Every day | ORAL | Status: AC
Start: 2014-06-03 — End: 2014-06-13

## 2014-06-03 NOTE — Progress Notes (Signed)
[facility]  Internal Medicine  Patient Encounter  Alecia Lemming, D.O., [facility]        Chief Complaint   Patient presents with   ??? Cough   ??? Fever     101 yesterday       HPI: 74 y.o. male with H/O multiple complex medical problems including chronic sysolic CHF, and Cirrhosis presents today with C/O cough and fever.  He is scheduled for cataract surgery tomorrow.  Symptoms started Saturday with increase fatigue and cough.  Cough has continued since Feb.  The cough has not gotten any better.  His diuretics have been adjusted.  Last night he developed a fever of 101.8.  He had no shaking chills.  He does stay cold and keeps himself covered.  He had no myalgias.  He is using Dulera.  He had not needed the Proventil.  The cough is minimally productive.  He continues to have wheezing.  Swelling is about the same.  Weights--up to 194.    Cough was reported as improved at visit with Dr. Jacqualyn Posey.   He denies heart burn, vomiting, choking or coughing with eating.      Past Medical History   Diagnosis Date   ??? Hypertension    ??? Kidney stones    ??? Alpha 1 antitrypsin deficiency    ??? Thrombocytopenia ([place]) 04/30/2009   ??? BPH (benign prostatic hyperplasia)    ??? 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 Chronic systolic CHF (congestive heart failure) ([place]) 10/18/2013   ??? CAD (coronary artery disease)      Non-obstructive   ??? Compression fracture of lumbar vertebra (HCC)      multiple, lumbar   ??? Leukopenia    ??? Anemia        Review of Systems - As per HPI      OBJECTIVE:  BP 100/60 mmHg   Pulse 76   Temp(Src) 98.1 ??F (36.7 ??C)   Resp 16   Wt 199 lb (90.266 kg)   Wt Readings from Last 3 Encounters:   06/03/14 199 lb (90.266 kg)   05/20/14 193 lb (87.544 kg)   05/14/14 199 lb (90.266 kg)       GEN: NAD, A&O, Non-toxic  HEENT: NC/AT, PERL, EOMI, Oral cavity Clear,  TM's NL, Nasal cavity clear.    NECK: Supple.  No thyromegaly.  LYMPH: No C/SC nodes.  CV: S1 S2 NL, RRR.  No murmurs, clicks or  rubs.  PULM: Left basilar inspiratory crackles and dullness, diminished air movement in the left base.  Loose, rhonchorous cough.  + Forced expiratory wheezing.    EXT: 2-3+ Edema, BL LE, Pitting      ASSESSMENT::  Encounter Diagnoses   Name Primary?   ??? Chronic cough Yes   ??? Fever, unspecified fever cause    ??? Lung crackles    ??? Chronic bronchitis, unspecified chronic bronchitis type ([place])    ??? Chronic systolic CHF (congestive heart failure) ([place])        Plan:  1. If no fever today, OK to have cataract surgery  2. CXR  3. May need a CT scan  4. Will need to see a pulmonologist  5. Mucinex   6. Use the Proventil as needed for wheezing  7. May need increase Lasix again      Discussed medications with patient who voiced understanding of their use, indication and potential side effects.  Pt also understands  the above recommendations.   All questions answered.

## 2014-06-03 NOTE — Patient Instructions (Addendum)
If no fever, OK to have cataract surgery.  Please call their office.  They may want to postpone.    Learning About Chronic Bronchitis  What is chronic bronchitis?     Chronic bronchitis is long-term swelling and the buildup of mucus in the airways of your lungs. The airways (bronchial tubes) get inflamed and make a lot of mucus. This can narrow or block the airways, making it hard for you to breathe. It is a form of COPD (chronic obstructive pulmonary disease).  Chronic bronchitis is usually caused by smoking. But chemical fumes, dust, or air pollution also can cause it over time.  What can you expect when you have chronic bronchitis?  Chronic bronchitis gets worse over time. You cannot undo the damage to your lungs.  Over time, you may find that:  ?? You get short of breath even when you do simple things like get dressed or fix a meal.  ?? It is hard to eat or exercise.  ?? You lose weight and feel weaker.  Over many years, the swelling and mucus from chronic bronchitis make it more likely that you will get lung infections.  But there are things you can do to prevent more damage and feel better.  What are the symptoms?  The main symptoms of chronic bronchitis are:  ?? A cough that will not go away.  ?? Mucus that comes up when you cough.  ?? Shortness of breath that gets worse when you exercise.  At times, your symptoms may suddenly flare up and get much worse. This is a called an exacerbation (say "egg-ZASS-er-BAY-shun"). When this happens, your usual symptoms quickly get worse and stay bad. This can be dangerous. You may have to go to the hospital.  How can you keep chronic bronchitis from getting worse?  Don't smoke. That is the best way to keep chronic bronchitis from getting worse. If you already smoke, it is never too late to stop. If you need help quitting, talk to your doctor about stop-smoking programs and medicines. These can increase your chances of quitting for good.  You can do other things to keep chronic  bronchitis from getting worse:  ?? Avoid bad air. Air pollution, chemical fumes, and dust also can make chronic bronchitis worse.  ?? Get a flu shot every year. A shot may keep the flu from turning into something more serious, like pneumonia. A flu shot also may lower your chances of having a flare-up.  ?? Get a pneumococcal shot. A shot can prevent some of the serious complications of pneumonia. Ask your doctor how often you should get this shot.  How is chronic bronchitis treated?  Chronic bronchitis is treated with medicines and oxygen. You also can take steps at home to stay healthy and keep your condition from getting worse.  Medicines and oxygen therapy  ?? You may be taking medicines such as:  ?? Bronchodilators. These help open your airways and make breathing easier. Bronchodilators are either short-acting (work for 6 to 9 hours) or long-acting (work for 24 hours). You inhale most bronchodilators, so they start to act quickly. Always carry your quick-relief inhaler with you in case you need it while you are away from home.  ?? Corticosteroids. These reduce airway inflammation. They come in pill or inhaled form. You must take these medicines every day for them to work well.  ?? Antibiotics. These medicines are used when you have a bacterial lung infection.  ?? Take your medicines exactly as  prescribed. Call your doctor if you think you are having a problem with your medicine.  ?? Oxygen therapy boosts the amount of oxygen in your blood and helps you breathe easier. Use the flow rate your doctor has recommended, and do not change it without talking to your doctor first.  Other care at home  ?? If your doctor recommends it, get more exercise. Walking is a good choice. Bit by bit, increase the amount you walk every day. Try for at least 30 minutes on most days of the week.  ?? Learn breathing methods--such as breathing through pursed lips--to help you become less short of breath.  ?? If your doctor has not set you up with a  pulmonary rehabilitation program, talk to him or her about whether rehab is right for you. Rehab includes exercise programs, education about your disease and how to manage it, help with diet and other changes, and emotional support.  ?? Eat regular, healthy meals. Use bronchodilators about 1 hour before you eat to make it easier to eat. Eat several small meals instead of three large ones. Drink beverages at the end of the meal. Avoid foods that are hard to chew.  Follow-up care is a key part of your treatment and safety. Be sure to make and go to all appointments, and call your doctor if you are having problems. It's also a good idea to know your test results and keep a list of the medicines you take.   Where can you learn more?   Go to [email or web address] and sign in to your MyChart account. Enter [phone] in the [place] box to learn more about ???Learning About Chronic Bronchitis.???    If you do not have an account, please click on the ???Sign Up Now??? link.     ?? 2006-2015 [employer]. Care instructions adapted under license by [facility]. This care instruction is for use with your licensed healthcare professional. If you have questions about a medical condition or this instruction, always ask your healthcare professional. [place] any warranty or liability for your use of this information.  Content Version: 10.6.465758; Current as of: November 14, 2012

## 2014-06-04 ENCOUNTER — Encounter

## 2014-06-04 NOTE — Telephone Encounter (Signed)
Pt wt today is 195.5#  192 on 05/27/14.  SOB, bilateral ankle edema--good in the morning then swells throughout the day.  Pt just diagnosed with bilateral pneumonia.      Cell number [phone]

## 2014-06-04 NOTE — Telephone Encounter (Signed)
Relayed Results and instructions as directed

## 2014-06-04 NOTE — Telephone Encounter (Signed)
 Patient wife calling would like for Andrew [name] to give her a call regarding her husband weight gain pls call to advise

## 2014-06-04 NOTE — Telephone Encounter (Signed)
Increase lasix to 40 mg BID for the next 5 days, then decrease to 40 mg daily.

## 2014-06-13 ENCOUNTER — Inpatient Hospital Stay: Admit: 2014-06-13 | Attending: Internal Medicine | Primary: Internal Medicine

## 2014-06-13 ENCOUNTER — Encounter

## 2014-06-13 DIAGNOSIS — J159 Unspecified bacterial pneumonia: Secondary | ICD-10-CM

## 2014-06-13 MED ORDER — SODIUM CHLORIDE 0.9 % IJ SOLN
0.9 % | Freq: Once | INTRAMUSCULAR | Status: AC
Start: 2014-06-13 — End: 2014-06-13
  Administered 2014-06-13: 20:00:00 10 mL via INTRAVENOUS

## 2014-06-13 MED ORDER — IOPAMIDOL 76 % IV SOLN
76 % | Freq: Once | INTRAVENOUS | Status: AC | PRN
Start: 2014-06-13 — End: 2014-06-13
  Administered 2014-06-13: 20:00:00 100 mL via INTRAVENOUS

## 2014-06-13 MED FILL — ISOVUE-370 76 % IV SOLN: 76 % | INTRAVENOUS | Qty: 100

## 2014-06-14 NOTE — Telephone Encounter (Signed)
CT results discussed with pts wife.

## 2014-06-14 NOTE — Telephone Encounter (Signed)
Pt wife is calling requesting to speak with someone regarding CT results.  Please call patient wife to discuss.  Thank you

## 2014-06-20 ENCOUNTER — Encounter

## 2014-06-20 LAB — BRAIN NATRIURETIC PEPTIDE: Pro-BNP: 981 pg/mL — ABNORMAL HIGH (ref 0–124)

## 2014-06-20 LAB — BASIC METABOLIC PANEL
Anion Gap: 8 (ref 3–16)
BUN: 20 mg/dL (ref 7–20)
CO2: 30 mmol/L (ref 21–32)
Calcium: 8.4 mg/dL (ref 8.3–10.6)
Chloride: 102 mmol/L (ref 99–110)
Creatinine: 0.8 mg/dL (ref 0.8–1.3)
GFR African American: >60 (ref >60)
GFR Non-African American: >60 (ref >60)
Glucose: 95 mg/dL (ref 70–99)
Potassium: 4.5 mmol/L (ref 3.5–5.1)
Sodium: 140 mmol/L (ref 136–145)

## 2014-06-24 ENCOUNTER — Ambulatory Visit: Admit: 2014-06-24 | Discharge: 2014-06-24 | Payer: MEDICARE | Attending: Adult Health | Primary: Internal Medicine

## 2014-06-24 DIAGNOSIS — I5022 Chronic systolic (congestive) heart failure: Secondary | ICD-10-CM

## 2014-06-24 MED ORDER — FUROSEMIDE 40 MG PO TABS
40 MG | ORAL_TABLET | ORAL | Status: DC
Start: 2014-06-24 — End: 2014-06-25

## 2014-06-24 MED ORDER — CARVEDILOL 6.25 MG PO TABS
6.25 MG | ORAL_TABLET | ORAL | Status: AC
Start: 2014-06-24 — End: ?

## 2014-06-24 MED ORDER — EPLERENONE 50 MG PO TABS
50 MG | ORAL_TABLET | Freq: Every day | ORAL | Status: DC
Start: 2014-06-24 — End: 2014-09-19

## 2014-06-24 NOTE — Telephone Encounter (Signed)
Spoke to the EC,advised to come in the office today, and see Denna, NP, at 1:30. Please add him onto the schedule.

## 2014-06-24 NOTE — Telephone Encounter (Signed)
Andrew [name] will see him this afternoon at 1:30. Given wt loss, increased edema, and recent CT results, he should be seen.

## 2014-06-24 NOTE — Patient Instructions (Signed)
1. Increase the furosemide (Lasix) to 80 mg in AM and 40 mg in PM  2. Start the eplerenone (Inspra) 50 mg once daily  3. Decrease the carvedilol (Coreg) to 3.125 mg in AM and 6.25 mg in PM  4. Have blood work next week  5. Follow up with me in 2 weeks

## 2014-06-24 NOTE — Progress Notes (Signed)
[place]   Cardiac Evaluation    Primary Care Doctor:  Alecia Lemming, DO    Chief Complaint   Patient presents with   ??? Edema      pitting, dizziness        History of Present Illness:   I had the pleasure of seeing Andrew [name] in follow up for sCHF, NICM, HTN, and non-alcoholic cirrhosis.  The spironolactone was stopped in March due to gynecomastia.  He then developed cough with fever, PNA per CXR and treated with ATBX.  He then called on 3/29 with increased edema so his Lasix was increased to 40 mg bid.  A CT scan on 06/13/14 continued to suggest bilateral PNA as well as worsening right pleural effusion.  Today he is having increased swelling but decrease in weight.  Blood work from 06/07/39/6 reviewed and stable.  His appetite has not been as good this past week but his weight has been fluctuating greatly over the past month.  His breathing has been improving since treated for the PNA.  He admits to some shortness of breath with rehab, more with the free weights.    Andrew [name] describes symptoms including dyspnea, fatigue, early saiety, edema, lightheadedness but denies chest pain, palpitations, orthopnea, PND, syncope.     Recent Hospitalization or Testing:   Echo 04/02/14:  -Limited bedside 2D echocardiogram was performed to evaluate EF.   -Left ventricular size is mildly increased .   -Left ventricular function is low normal with ejection fraction estimated at   50%.   -There is a question of mild inferior hypokinesis.   -There is mild mitral and trivial pulmonic and tricuspid regurgitation with   RVSP estimated at 27 mmHg.   -Right heart size is borderline dilated .   -Dilated left atrium with a volume of 51ml.  Echo 01/16/14:  -Limited 2D echocardiogram to evaluate ejection fraction.   -The left ventricular systolic function is mildly reduced with an ejection   fraction of 45%  Cardiac Cath 10/03/13:  Anatomy:   LM-normal   LAD-50% mid with FFR of 0.79  Cx-normal, dominant  OM1-  normal  RCA-normal  LVEF- 30%  Hemodynamics:  RA- mean 12  RV- 37  PAWP - mean 19    PASP- 40 mmHg  C.O. 5.6    Echo 09/05/2013--  Dilated left ventricle. Normal wall thickness. Decreased left ventricular systolic function with an estimated EF 30-35%.Inferior wall hypokinesis E/e=25. Mild to moderate mitral regurgitation. The left atrium is dilated. Trace of aortic insufficiency. Mild tricuspid regurgitation with RVSP estimated at 36 mmHg.The aorta is within normal limits.    NYHA:  III    Past Medical History:   has a past medical history of Hypertension; Kidney stones; Alpha 1 antitrypsin deficiency; Thrombocytopenia ([place]); BPH (benign prostatic hyperplasia); Cirrhosis, nonalcoholic ([place]); Chronic systolic CHF (congestive heart failure) ([place]); CAD (coronary artery disease); Compression fracture of lumbar vertebra ([place]); Leukopenia; Anemia; and Pneumonia.  Surgical History:   has past surgical history that includes AV fistula repair (1970's); Colonoscopy (4/12); Cardiac catheterization; eye surgery (04/12/2014); and Cosmetic surgery (04/2013).   Social History:   reports that he has never smoked. He has never used smokeless tobacco. He reports that he does not drink alcohol or use illicit drugs.   Family History:   Family History   Problem Relation Age of Onset   ??? Diabetes Mother    ??? Diabetes Sister    ??? Diabetes Brother  Home Medications:  Prior to Admission medications    Medication Sig Start Date End Date Taking? Authorizing Provider   UNKNOWN TO PATIENT 2 types of eyedrops- pre cataract surgery   Yes Historical Provider, MD   lisinopril (PRINIVIL;ZESTRIL) 5 MG tablet Take 1 tablet by mouth nightly 05/21/14  Yes Donata Duff, NP   furosemide (LASIX) 40 MG tablet Take 1 tablet by mouth daily 05/14/14  Yes Scott A Kotzin, DO   albuterol sulfate HFA (PROAIR HFA) 108 (90 BASE) MCG/ACT inhaler Inhale 2 puffs into the lungs every 6 hours as needed for Wheezing 05/14/14  Yes Scott A Kotzin, DO    mometasone-formoterol (DULERA) 200-5 MCG/ACT inhaler Inhale 2 puffs into the lungs 2 times daily 05/14/14  Yes Scott A Kotzin, DO   magnesium oxide (MAG-OX) 400 (241.3 MG) MG TABS tablet TAKE ONE TABLET BY MOUTH TWICE DAILY 04/26/14  Yes Scott A Kotzin, DO   carvedilol (COREG) 6.25 MG tablet Take 1 tablet by mouth 2 times daily (with meals) 03/18/14  Yes Donata Duff, NP   traMADol (ULTRAM) 50 MG tablet Take 1 tablet by mouth every 12 hours as needed for Pain 03/07/14  Yes Scott A Kotzin, DO   lactulose 20 GM/30ML SOLN Take 30 mLs by mouth 2 times daily 08/13/13  Yes Scott A Kotzin, DO   Cholecalciferol (VITAMIN D3) 1000 UNITS CAPS Take by mouth daily    Yes Historical Provider, MD   MILK THISTLE PO Take  by mouth.   Yes Historical Provider, MD   rifaximin (XIFAXAN) 550 MG tablet Take 550 mg by mouth 2 times daily.     Yes Historical Provider, MD   Multiple Vitamins-Minerals (CENTRUM SILVER PO) Take  by mouth daily. 08/23/08  Yes Historical Provider, MD   pilocarpine (PILOCAR) 1 % ophthalmic solution 1 drop 2 times daily    Historical Provider, MD   Brimonidine Tartrate (ALPHAGAN P OP) Apply to eye 2 times daily    Historical Provider, MD        Allergies:  Review of patient's allergies indicates no known allergies.     Review of Systems:   ?? Constitutional: there has been no unanticipated weight loss. There's been no change in energy level, sleep pattern, or activity level.     ?? Eyes: No visual changes or diplopia. No scleral icterus.  ?? ENT: No Headaches, hearing loss or vertigo. No mouth sores or sore throat.  ?? Cardiovascular: Reviewed in HPI  ?? Respiratory: No cough or wheezing, no sputum production. No hematemesis.    ?? Gastrointestinal: No abdominal pain, appetite loss, blood in stools. No change in bowel or bladder habits.  ?? Genitourinary: No dysuria, trouble voiding, or hematuria.  ?? Musculoskeletal:  No gait disturbance, weakness or joint complaints.  ?? Integumentary: No rash or  pruritis.  ?? Neurological: No headache, diplopia, change in muscle strength, numbness or tingling. No change in gait, balance, coordination, mood, affect, memory, mentation, behavior.  ?? Psychiatric: No anxiety, no depression.  ?? Endocrine: No malaise, fatigue or temperature intolerance. No excessive thirst, fluid intake, or urination. No tremor.  ?? Hematologic/Lymphatic: No abnormal bruising or bleeding, blood clots or swollen lymph nodes.  ?? Allergic/Immunologic: No nasal congestion or hives.    Physical Examination:    Filed Vitals:    06/24/14 1407   BP: 110/70   Pulse: 58   Height: 5\' 5"  (1.651 m)   Weight: 186 lb 3.2 oz (84.46 kg)   SpO2: 95%  Constitutional and General Appearance: Warm and dry, no apparent distress, normal coloration  HEENT:  Normocephalic, atraumatic  Respiratory:  ?? Normal excursion and expansion without use of accessory muscles  ?? Resp Auscultation: Bibasilar rales 1/2 up, R > L   Cardiovascular:  ?? The apical impulses not displaced  ?? Heart tones are crisp and normal  ?? JVP 10-11 cm H2O  ?? Regular rate and rhythm, normal S1S2, no m/g/r/c  ?? Peripheral pulses are symmetrical and full  ?? There is no clubbing, cyanosis of the extremities.  ?? 2+ edema  ?? Pedal Pulses: 2+ and equal   Abdomen:  ?? No masses or tenderness  ?? Liver/Spleen: No Abnormalities Noted  Neurological/Psychiatric:  ?? Alert and oriented in all spheres  ?? Moves all extremities well  ?? Exhibits normal gait balance and coordination  ?? No abnormalities of mood, affect, memory, mentation, or behavior are noted    Lab Data:  CBC:   Lab Results   Component Value Date    WBC 2.6 05/03/2014    WBC 2.2 05/01/2014    WBC 2.6 04/03/2014    RBC 2.18 05/03/2014    RBC 2.10 05/01/2014    RBC 2.23 04/03/2014    HGB 8.7 05/03/2014    HGB 8.4 05/01/2014    HGB 8.8 04/03/2014    HCT 25.5 05/03/2014    HCT 24.9 05/01/2014    HCT 26.0 04/03/2014    MCV 117.1 05/03/2014    MCV 118.8 05/01/2014    MCV 116.7 04/03/2014    RDW 20.6 05/03/2014     RDW 21.0 05/01/2014    RDW 18.9 04/03/2014    PLT 69 05/03/2014    PLT 64 05/01/2014    PLT 67 04/03/2014     BMP:   Lab Results   Component Value Date    NA 140 06/20/2014    NA 141 05/03/2014    NA 141 05/01/2014    K 4.5 06/20/2014    K 4.8 05/03/2014    K 4.5 05/01/2014    CL 102 06/20/2014    CL 107 05/03/2014    CL 110 05/01/2014    CO2 30 06/20/2014    CO2 27 05/03/2014    CO2 28 05/01/2014    PHOS 2.9 04/06/2010    PHOS 3.3 10/30/2009    PHOS 2.9 10/09/2009    BUN 20 06/20/2014    BUN 13 05/03/2014    BUN 15 05/01/2014    CREATININE 0.8 06/20/2014    CREATININE 0.7 05/03/2014    CREATININE 0.8 05/01/2014     BNP:   Lab Results   Component Value Date    PROBNP 981 06/20/2014    PROBNP 1010 05/01/2014    PROBNP 734 04/18/2014     PT/INR:   Lab Results   Component Value Date    PROTIME 16.1 04/02/2014    PROTIME 14.9 01/10/2014    PROTIME 12.7 11/19/2011    INR 1.40 04/02/2014    INR 1.30 01/10/2014    INR 1.14 11/19/2011    INR 1.17 05/14/2010    INR 1.18 02/13/2010    INR 1.15 10/17/2009     FASTING LIPID PANEL:  Lab Results   Component Value Date    CHOL 101 04/02/2014    HDL 22 04/02/2014    HDL 59 12/29/2009    TRIG 83 04/02/2014       Assessment:    1. Chronic systolic CHF (congestive heart failure) ([place])    2.  Idiopathic cardiomyopathy ([place])    3. Essential hypertension    4. Cirrhosis, nonalcoholic ([place])    5. Anemia, unspecified type    6. Alpha-1-antitrypsin deficiency          Plan:   1. Increase the furosemide (Lasix) to 80 mg in AM and 40 mg in PM  2. Start the eplerenone (Inspra) 50 mg once daily  3. Decrease the carvedilol (Coreg) to 3.125 mg in AM and 6.25 mg in PM  4. Have blood work next week  5. Follow up with me in 2 weeks    I appreciate the opportunity of cooperating in the care of this individual.    Princess Bruins, CNP, 06/24/2014, 2:35 PM

## 2014-06-24 NOTE — Telephone Encounter (Signed)
Medication needing refilled:furosemide     Doseage of the medication:40mg      How are you taking this medication (QD, BID, TID, QID, PRN):TID    Patient want a 30 or 90 day supply called in:90    Which Pharmacy are we sending the medication to:[facility] [address] Phone number:in file     Pharmacy Fax number:

## 2014-06-24 NOTE — Telephone Encounter (Signed)
Pt's wife was calling to updated pt's condition with NPSR.  Pt has lost weigh about 8 pounds in the last five days and his legs are extremely swollen. Pt is scheduled for rehab at 1p today and would like to know if he should go to this appt.  Please call to advise.  Thank you

## 2014-06-25 MED ORDER — FUROSEMIDE 40 MG PO TABS
40 MG | ORAL_TABLET | ORAL | Status: DC
Start: 2014-06-25 — End: 2014-09-02

## 2014-06-25 NOTE — Telephone Encounter (Signed)
Spoke with wife and she states that patient was seen by DD yesterday and his Lasix was increase to 3 times a day; when she called [facility] they did not get an order. Refill sent as requested and reviewed DD instructions for 80 mg in the mornings and 40 mg in the evenings with follow up appointment in 2 weeks. Wife verbalizes understanding.

## 2014-06-26 ENCOUNTER — Ambulatory Visit
Admit: 2014-06-26 | Discharge: 2014-06-26 | Payer: MEDICARE | Attending: Critical Care Medicine | Primary: Internal Medicine

## 2014-06-26 DIAGNOSIS — E8801 Alpha-1-antitrypsin deficiency: Secondary | ICD-10-CM

## 2014-06-26 NOTE — Communication Body (Signed)
Assessment:  Assessment:    1. Alpha-1-antitrypsin deficiency     2. Pleural effusion     3. Bronchiectasis without complication ([place])     4. Leg swelling             Plan:  Plan:    1. I discussed with patient and his wife the above  2. I reviewed his CXRs and recent CT chest  3. With his cirrhosis and CHF I think his R pleural effusion is related especially that he was off diuretics for 6 weeks. He was just increased 2 days ago and I will repeat CXR next week to F/U. If not resolved will plan R thoracentesis  4. He will need repeat full PFT and CT chest once his R pleural effusion is resolved  5. He does have bronchiectasis changes and emphysematous changes most likely related to alpha 1 antitrypsin. We will discuss treatment after reevaluation   6. RTC in 4 weeks

## 2014-06-26 NOTE — Telephone Encounter (Signed)
Spoke to the pt. This refill was taken care of 06/25/14.

## 2014-06-26 NOTE — Progress Notes (Signed)
Andrew [name] is 74 y.o. male here for Pulmonary Medicine consultation referred by Dr. Susie Cassette for evaluation of had concerns including Abnormal CT; and Shortness of Breath.     Patient was diagnosed with alpha 1 antitrypsin in 2011 at the time of diagnosis of hepatic failure and cirrhosis  He is followed by Dr. Einar Grad and liver biopsy suggested NASH  His PFT 2011 was read normal  He was treated recently for pneumonia  He was off diuretics for over a month and restarted on lower dose 6 weeks ago  He developed worsening LE edema and worsening CXR  His CT chest was done 2 weeks ago and read as:  EXAMINATION:   CT OF THE CHEST WITH CONTRAST 06/13/2014 3:56 pm      TECHNIQUE:   CT of the chest was performed with the administration of intravenous   contrast. Multiplanar reformatted images are provided for review.      COMPARISON:   None.      HISTORY:   Bacterial pneumonia, unspecified      FINDINGS:   Mediastinum:      *A lymph node is seen pretracheal upper mediastinum interposed between the   proximal left common carotid artery and brachiocephalic artery, image 31.   This measures approximately 12 mm. Subcarinal density possible subcarinal   adenopathy, difficult to separate from adjacent fluid.   *While there is no significant cardiac enlargement, there is chamber   enlargement, left ventricle and left atrium. This could indicate mitral   valve disease. Coronary calcification is present.   *There is mild coronary calcification.   *Vasculature is unremarkable for age.   Lungs/pleura:      *Large pleural effusion is seen on the right and small effusion on the left.   Multifocal patchy areas of airspace disease are seen bilaterally both upper   and lower lobes. Right lower lobe has a somewhat bronchus centric   distribution. There is bronchiectasis bilaterally. Focal area of   consolidation is seen, right upper lobe anteriorly.   *Airways are unremarkable.   Soft Tissues/Bones: Bilateral gynecomastia is present. Small  nodular lesion   projects from the inferior right thyroid gland. Sagittal projection   demonstrates multiple compression fractures L1 moderate to severe, L2   moderate, T9 moderate and T10 minimal. Bones are demineralized. These   fractures are faintly noted on prior lateral chest radiograph.      Upper Abdomen: The liver is grossly abnormal, with small overall size,   increased density and extensive nodularity throughout the serosal surface.   The spleen is mildly enlarged. There is are extensive unenhanced splenic   varices. Multiple unenhanced gastric and paraesophageal varices are also   noted. Small right renal calculus present         Impression   IMPRESSION:   Multifocal airspace disease most likely pneumonia with large pleural effusion   primarily on the right.      Chronic bronchiectasis.      Left ventricular and atrial chamber enlargement could indicate mitral valve   disease.      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      Multiple compression fractures essentially unchanged.      Gynecomastia.      Multiple prior moderate compression fractures.      He denies S.O.B and on RA today  He has pancytopenia as well    Past Medical History   Diagnosis Date   ???  Hypertension    ??? Kidney stones    ??? Alpha 1 antitrypsin deficiency    ??? Thrombocytopenia ([place]) 04/30/2009   ??? BPH (benign prostatic hyperplasia)    ??? Cirrhosis, nonalcoholic ([place])      Due to Alpha-1 vs NASH   ??? Chronic systolic CHF (congestive heart failure) ([place]) 10/18/2013   ??? CAD (coronary artery disease)      Non-obstructive   ??? Compression fracture of lumbar vertebra ([facility])      multiple, lumbar   ??? Leukopenia    ??? Anemia    ??? Pneumonia      Current Outpatient Prescriptions   Medication Sig Dispense Refill   ??? furosemide (LASIX) 40 MG tablet 80 mg in AM, 40 mg PM 90 tablet 5   ??? UNKNOWN TO PATIENT 2 types of eyedrops- pre cataract surgery     ??? carvedilol (COREG) 6.25 MG  tablet 3.125 mg in AM and 6.25 mg in PM 60 tablet 3   ??? eplerenone (INSPRA) 50 MG tablet Take 1 tablet by mouth daily 30 tablet 3   ??? lisinopril (PRINIVIL;ZESTRIL) 5 MG tablet Take 1 tablet by mouth nightly 90 tablet 3   ??? albuterol sulfate HFA (PROAIR HFA) 108 (90 BASE) MCG/ACT inhaler Inhale 2 puffs into the lungs every 6 hours as needed for Wheezing 1 Inhaler 3   ??? mometasone-formoterol (DULERA) 200-5 MCG/ACT inhaler Inhale 2 puffs into the lungs 2 times daily 2 Inhaler 0   ??? pilocarpine (PILOCAR) 1 % ophthalmic solution 1 drop 2 times daily     ??? Brimonidine Tartrate (ALPHAGAN P OP) Apply to eye 2 times daily     ??? magnesium oxide (MAG-OX) 400 (241.3 MG) MG TABS tablet TAKE ONE TABLET BY MOUTH TWICE DAILY 60 tablet 5   ??? traMADol (ULTRAM) 50 MG tablet Take 1 tablet by mouth every 12 hours as needed for Pain 60 tablet 0   ??? lactulose 20 GM/30ML SOLN Take 30 mLs by mouth 2 times daily 5400 mL 1   ??? Cholecalciferol (VITAMIN D3) 1000 UNITS CAPS Take by mouth daily      ??? MILK THISTLE PO Take  by mouth.     ??? rifaximin (XIFAXAN) 550 MG tablet Take 550 mg by mouth 2 times daily.       ??? Multiple Vitamins-Minerals (CENTRUM SILVER PO) Take  by mouth daily.       No current facility-administered medications for this visit.       Family History   Problem Relation Age of Onset   ??? Diabetes Mother    ??? Diabetes Sister    ??? Diabetes Brother      History     Social History   ??? Marital Status: Married     Spouse Name: N/A     Number of Children: N/A   ??? Years of Education: N/A     Occupational History   ??? Not on file.     Social History Main Topics   ??? Smoking status: Never Smoker    ??? Smokeless tobacco: Never Used      Comment: Passive smoke exposure: yes   ??? Alcohol Use: No   ??? Drug Use: No   ??? Sexual Activity: Not on file     Other Topics Concern   ??? Not on file     Social History Narrative         Review of Systems   Constitutional: Negative.  Negative for fever, chills, diaphoresis, activity change,  appetite change,  fatigue and unexpected weight change.   HENT: Negative.  Negative for hearing loss, ear pain, nosebleeds, congestion, facial swelling, rhinorrhea, sneezing, neck pain, neck stiffness, postnasal drip, sinus pressure and ear discharge.    Eyes: Negative.  Negative for photophobia, pain, discharge, redness, itching and visual disturbance.   Respiratory: As per HPI  Cardiovascular: Negative.  Negative for chest pain, palpitations and leg swelling.   Gastrointestinal: Negative.  Negative for abdominal pain, blood in stool, abdominal distention and anal bleeding.   Genitourinary: Negative.  Negative for dysuria, urgency, frequency, hematuria, decreased urine volume, enuresis and difficulty urinating.   Musculoskeletal: Negative.  Negative for myalgias, back pain, joint swelling, arthralgias and gait problem.   Skin: Negative.  Negative for color change and pallor.   Neurological: Negative.  Negative for dizziness, tremors, seizures, syncope, facial asymmetry, speech difficulty, weakness, light-headedness, numbness and headaches.   Hematological: Negative.  Negative for adenopathy.   Psychiatric/Behavioral: Negative.  Negative for hallucinations, behavioral problems, confusion and agitation. The patient is not nervous/anxious and is not hyperactive.      Blood pressure 113/69, pulse 84, resp. rate 18, height 5\' 5"  (1.651 m), weight 185 lb (83.915 kg), SpO2 94 %.    Physical Exam   Constitutional: Oriented. No distress.   HENT:   Head: Normocephalic and atraumatic.   Mouth/Throat: Oropharynx is clear and moist. No oropharyngeal exudate.   Eyes: Conjunctivae and extraocular motions are normal. Pupils are equal, round, and reactive to light. Right eye exhibits no discharge. Left eye exhibits no discharge.   Neck: Normal range of motion. Neck supple. No JVD present. Carotid bruit is not present. No rigidity. No tracheal deviation present. No thyromegaly present.   Cardiovascular: regular rhythm, S1&S2 and intact distal pulses.     Pulmonary/Chest: bilateral breath sounds. Has no wheezes. Has no rhonchi. Has no rales. Exhibits no tenderness.   Abdominal: Soft. Bowel sounds are normal. Exhibits no shifting dullness, no distension and no mass. No tenderness. Has no rebound and no guarding.   Musculoskeletal: Normal range of motion. Exhibits 3+ LE edema and no tenderness.   Lymphadenopathy:     Has no cervical adenopathy.     Has no axillary adenopathy.   Neurological: Alert and oriented. Has normal reflexes. No cranial nerve deficit. Exhibits normal muscle tone. Coordination normal.   Skin: Skin is warm and dry. No rash noted. No erythema.   Psychiatric: Has a normal mood and affect. Behavior is normal. Thought content normal.      Assessment:    1. Alpha-1-antitrypsin deficiency     2. Pleural effusion     3. Bronchiectasis without complication ([place])     4. Leg swelling               Plan:    1. I discussed with patient and his wife the above  2. I reviewed his CXRs and recent CT chest  3. With his cirrhosis and CHF I think his R pleural effusion is related especially that he was off diuretics for 6 weeks. He was just increased 2 days ago and I will repeat CXR next week to F/U. If not resolved will plan R thoracentesis  4. He will need repeat full PFT and CT chest once his R pleural effusion is resolved  5. He does have bronchiectasis changes and emphysematous changes most likely related to alpha 1 antitrypsin. We will discuss treatment after reevaluation   6. RTC in 4 weeks

## 2014-06-27 ENCOUNTER — Encounter: Primary: Internal Medicine

## 2014-06-27 ENCOUNTER — Inpatient Hospital Stay: Attending: Critical Care Medicine | Primary: Internal Medicine

## 2014-06-27 ENCOUNTER — Telehealth

## 2014-06-27 ENCOUNTER — Encounter: Admit: 2014-06-27 | Primary: Internal Medicine

## 2014-06-27 DIAGNOSIS — R609 Edema, unspecified: Secondary | ICD-10-CM

## 2014-06-27 MED ORDER — IOPAMIDOL 76 % IV SOLN
76 % | Freq: Once | INTRAVENOUS | Status: AC | PRN
Start: 2014-06-27 — End: 2014-06-27
  Administered 2014-06-27: 19:00:00 100 mL via INTRAVENOUS

## 2014-06-27 MED FILL — ISOVUE-370 76 % IV SOLN: 76 % | INTRAVENOUS | Qty: 100

## 2014-06-27 NOTE — Telephone Encounter (Signed)
Pt had his Venous Doppler today which showed possitive DVT lower extremities  -  Dr Jeb Levering will order a CTPA STAT for pt's SOB

## 2014-06-27 NOTE — Telephone Encounter (Signed)
Pt informed via vm that he will receive a call tomorrow from [place] about his treatment

## 2014-06-27 NOTE — Telephone Encounter (Signed)
Will review CTPA and then decide on treatment

## 2014-06-28 NOTE — Telephone Encounter (Signed)
I spoke with Dr. Susie Cassette and discussed [name] condition. He is high risk for bleeding and his CTPA is negative. His DVT is in small vein below the knee and will plan serial F/U dopplers for now. His R pleural effusion looked little smaller on CTPA and will plan repeat CT chest w/o contrast in 1 month before F/U visit

## 2014-06-28 NOTE — Telephone Encounter (Signed)
He will need F/U here.

## 2014-06-28 NOTE — Telephone Encounter (Signed)
Pt informed and he will get the CT Chest before his next appt - pt is going to [place] and wanted to know if he should stop due to the DVT - I instructed the pt's wife to call his PCP for the answer to that question

## 2014-06-28 NOTE — Telephone Encounter (Signed)
Patient called requesting to know if he could go to cardiac rehab and per Dr Susie Cassette no issue with that. Also Wendall Mola is moving his 5/2 appt up to next week per Dr Susie Cassette

## 2014-07-01 NOTE — Telephone Encounter (Signed)
Wife would like to speak with [employer] regarding changes in pt.  BP is 97/60 today and weight is 173, when seen by the lung specialist, a blood clot was found in his leg.  Please call to discuss.  Thanks.

## 2014-07-01 NOTE — Telephone Encounter (Signed)
Spoke to Mrs. Budge.  His weight has dropped from 181 lbs on 06/24/14 to 173 lbs today.  He initially doubled his pain pills instead of doubling his Lasix.  His wife has taken over his medicines for now.  BP running 97/60.      Recommended to decrease the Lasix from 80/ 40 to 40 mg bid.  They will have blood work on Wednesday (see's Dr. Susie Cassette then.  Voiced understanding.   Princess Bruins, NP

## 2014-07-03 ENCOUNTER — Ambulatory Visit: Admit: 2014-07-03 | Discharge: 2014-07-03 | Payer: MEDICARE | Attending: Internal Medicine | Primary: Internal Medicine

## 2014-07-03 ENCOUNTER — Telehealth

## 2014-07-03 DIAGNOSIS — I824Z1 Acute embolism and thrombosis of unspecified deep veins of right distal lower extremity: Secondary | ICD-10-CM

## 2014-07-03 MED ORDER — CITALOPRAM HYDROBROMIDE 10 MG PO TABS
10 MG | ORAL_TABLET | Freq: Every day | ORAL | Status: DC
Start: 2014-07-03 — End: 2014-09-19

## 2014-07-03 NOTE — Patient Instructions (Signed)
Learning About Coronary Artery Disease (CAD)  What is coronary artery disease?     Coronary artery disease (CAD) occurs when plaque builds up in the arteries that bring oxygen-rich blood to your heart. Plaque is a fatty substance made of cholesterol, calcium, and other substances in the blood. This process is called hardening of the arteries, or atherosclerosis.  What happens when you have coronary artery disease?  ?? Plaque may narrow the coronary arteries. Narrowed arteries cause poor blood flow. This can lead to angina symptoms such as chest pain or discomfort. If blood flow is completely blocked, you could have a heart attack.  ?? You can slow CAD and reduce the risk of future problems by making changes in your lifestyle. These include quitting smoking and eating heart-healthy foods.  ?? Treatments for CAD, along with changes in your lifestyle, can help you live a longer and healthier life.  How can you prevent coronary artery disease?  ?? Do not smoke. It may be the best thing you can do to prevent heart disease. If you need help quitting, talk to your doctor about stop-smoking programs and medicines. These can increase your chances of quitting for good.  ?? Be active. Get at least 30 minutes of exercise on most days of the week. Walking is a good choice. You also may want to do other activities, such as running, swimming, cycling, or playing tennis or team sports.  ?? Eat heart-healthy foods. Eat more fruits and vegetables and less foods that contain saturated and trans fats. Limit alcohol, sodium, and sweets.  ?? Stay at a healthy weight. Lose weight if you need to.  ?? Manage other health problems such as diabetes, high blood pressure, and high cholesterol.  ?? Talk to your doctor about taking a daily aspirin.  ?? Manage stress. Stress can hurt your heart. To keep stress low, talk about your problems and feelings. Don't keep your feelings hidden.  How is coronary artery disease treated?  ?? Your doctor will suggest  that you make lifestyle changes. For example, your doctor may ask you to eat healthy foods, quit smoking, lose extra weight, and be more active.  ?? You will have to take medicines.  ?? Your doctor may suggest a procedure to open narrowed or blocked arteries. This is called angioplasty. Or your doctor may suggest using healthy blood vessels to create detours around narrowed or blocked arteries. This is called bypass surgery.  Follow-up care is a key part of your treatment and safety. Be sure to make and go to all appointments, and call your doctor if you are having problems. It's also a good idea to know your test results and keep a list of the medicines you take.   Where can you learn more?   Go to [email or web address] and sign in to your MyChart account. Enter C643 in the Search Health Information box to learn more about ???Learning About Coronary Artery Disease (CAD).???    If you do not have an account, please click on the ???Sign Up Now??? link.     ?? 2006-2015 [employer]. Care instructions adapted under license by [facility]. This care instruction is for use with your licensed healthcare professional. If you have questions about a medical condition or this instruction, always ask your healthcare professional. [employer] Inc[employer] disclaims any warranty or liability for your use of this information.  Content Version: 10.6.465758; Current as of: April 27, 2013

## 2014-07-03 NOTE — Telephone Encounter (Signed)
lmz on vm to inform the pt that he still needs to have the CXR before his next appt - order in epic

## 2014-07-03 NOTE — Telephone Encounter (Signed)
yes before next visit to F/U on effusion

## 2014-07-03 NOTE — Progress Notes (Signed)
Reviewed OTC Meds?  Yes  Patient understands current medications?  Yes  Any resources/referrals provided to patient?  yes - CAD  Any tools provided to patient?  no

## 2014-07-03 NOTE — Telephone Encounter (Signed)
Pt had his CTA Pulmonary do you still want another CXR before her nest appt - Dr Jeb Levering please advise

## 2014-07-03 NOTE — Progress Notes (Addendum)
[facility]  Internal Medicine  Patient Encounter  Andrew [name], D.O., [facility]        Chief Complaint   Patient presents with   ??? Coronary Artery Disease       HPI: 74 y.o. male seen today for follow up regarding chronic medical problems.  He has since been seen by Dr. Jeb Levering for patchy ASD, bronchiectasis, and pleural effusion on CT scan.  His diuretics have been adjusted by cardiology.  Pulmonary wanted to repeat CT scan.  hemay require thoracentesis.  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  Note reviewed and discussed on the phone.  He was treated from this office with Avelox.  He is significantly better per wife.  Cough is near non-existent.    Dr. Jeb Levering ordered a venous doppler--   Summary      -Acute partially occluding deep vein thrombosis involving the right set of   gastroc veins zone 5-6.   -Reflux noted in the right FV.   He will need serial dopplers.   He was sent for a CTA-- No PE. He may need another CXR.    In addition, he may be a candidate for treatment for Alpha 1 (Pulm)       HTN-- BP checks at home:No.  Condition is well controlled.  No dizziness, lightheadedness.  Tolerating medications.  Coreg was decreased.          Chronic Systolic CHF-- Seen by CNP 06/24/2014 Reed Pandy).  He reported multiple complaints.  Note reviewed.  Last echo 04/02/2014-- EF 50% which was improved since 09/2013 (35%).  His Lasix was increased to 80 mg in the AM and 40 mgin the evening.  He was also started on Inspra (Aldosterone receptor blocker).  LE edema.  He lost weight and the Lasix went back down.      Cirrhosis-- He is under the care of Dr. Einar Grad, next appt 07/2014. 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  ??  Complicated by varices, pancytopenia.      He is C/O feeling down and blue.  He is tired of going to doctors and coping with his medical problems.  He cried in the pulmonologist's  office.  He is not fearful of dying, but does not want to die.  He wants to continue to be aggressive.  He is most bothered by his back pain.      Past Medical History   Diagnosis Date   ??? Hypertension    ??? Kidney stones    ??? Alpha 1 antitrypsin deficiency    ??? Thrombocytopenia ([place]) 04/30/2009   ??? BPH (benign prostatic hyperplasia)    ??? 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 Chronic systolic CHF (congestive heart failure) ([place]) 10/18/2013   ??? CAD (coronary artery disease)      Non-obstructive   ??? Compression fracture of lumbar vertebra (HCC)      multiple, lumbar   ??? Leukopenia    ??? Anemia    ??? Pneumonia    ??? DVT (deep venous thrombosis) (HCC)        Review of Systems - As per HPI      OBJECTIVE:  BP 100/66 mmHg   Pulse 60   Resp 16   Ht 5\' 5"  (1.651 m)   Wt 180 lb (81.647 kg)   BMI 29.95  kg/m2   Wt Readings from Last 3 Encounters:   07/03/14 180 lb (81.647 kg)   06/26/14 185 lb (83.915 kg)   06/24/14 186 lb 3.2 oz (84.46 kg)       GEN: NAD, A&O, Non-toxic  HEENT: NC/AT, No scleral icterus, EOMI, Oral cavity Clear,  TM's NL  NECK: Supple.  No thyromegaly.  No JVD today.      LYMPH: No C/SC nodes.  CV: S1 S2 NL, RRR.  No murmurs, clicks or rubs. No JVD  PULM: CTA, symmentric air exchange, no crackles  EXT: BL 2 + LE edema--seems better   GI: Abdomen is soft, NT, BS+, No hepatomegaly.  VASC:  No carotid bruits.    SKIN:  No rashes  PSYCH:  Mood is depressed.  Affect flat    ASSESSMENT::  Encounter Diagnoses   Name Primary?   ??? Calf DVT (deep venous thrombosis), right (HCC) Yes   ??? Chronic systolic CHF (congestive heart failure) ([place])    ??? Bronchiectasis without complication ([place])    ??? Benign essential HTN    ??? Coagulopathy ([place])    ??? Cirrhosis, nonalcoholic ([place])    ??? Pancytopenia ([place])    ??? Other depression due to general medical condition        Plan:  1. Venous doppler right LE to check for propagation.  Pt is not a candidate for Oral anticoagulation (Coumadin or newer anticoagulants)  He is  at risk for bleeding.    2. Consultation with Dr. Christene Lye regarding Pancytopenia and DVT.  ??? IVC filter. 3. Trial of Celexa 10 mg daily     We briefly discussed palliative and comfort care.

## 2014-07-04 LAB — CBC
Hematocrit: 28.9 % — ABNORMAL LOW (ref 40.5–52.5)
Hemoglobin: 10 g/dL — ABNORMAL LOW (ref 13.5–17.5)
MCH: 40.6 pg — ABNORMAL HIGH (ref 26.0–34.0)
MCHC: 34.4 g/dL (ref 31.0–36.0)
MCV: 117.9 fL — ABNORMAL HIGH (ref 80.0–100.0)
MPV: 9.8 fL (ref 5.0–10.5)
Platelets: 60 10*3/uL — ABNORMAL LOW (ref 135–450)
RBC: 2.45 M/uL — ABNORMAL LOW (ref 4.20–5.90)
RDW: 18.9 % — ABNORMAL HIGH (ref 12.4–15.4)
WBC: 2.6 10*3/uL — ABNORMAL LOW (ref 4.0–11.0)

## 2014-07-04 LAB — BASIC METABOLIC PANEL
Anion Gap: 9 (ref 3–16)
BUN: 21 mg/dL — ABNORMAL HIGH (ref 7–20)
CO2: 30 mmol/L (ref 21–32)
Calcium: 9.3 mg/dL (ref 8.3–10.6)
Chloride: 98 mmol/L — ABNORMAL LOW (ref 99–110)
Creatinine: 0.9 mg/dL (ref 0.8–1.3)
GFR African American: >60 (ref >60)
GFR Non-African American: >60 (ref >60)
Glucose: 148 mg/dL — ABNORMAL HIGH (ref 70–99)
Potassium: 4.4 mmol/L (ref 3.5–5.1)
Sodium: 137 mmol/L (ref 136–145)

## 2014-07-04 LAB — BRAIN NATRIURETIC PEPTIDE: Pro-BNP: 351 pg/mL — ABNORMAL HIGH (ref 0–124)

## 2014-07-05 NOTE — Telephone Encounter (Signed)
new patient DX low platelets. Referred by Dr Gwinda Maine. [employer]. Appointment 07/09/14@3 :45/4:00. Patient will download new patient forms from website. Tboes

## 2014-07-05 NOTE — Telephone Encounter (Signed)
She stated that pt was prescribed a new med Citalopram.  He has been taking meds for past 2 days.  He has been very dizzy.  Can he stop taking meds?  Please advise.

## 2014-07-05 NOTE — Telephone Encounter (Signed)
Per Dr Weldon Inches this can happened, drink plenty of fluids, move slowly. Can take half for a couple of days. Informed wife to call on Monday and let Dr Susie Cassette know how he is doing

## 2014-07-05 NOTE — Telephone Encounter (Signed)
Dr. Norvel Richards records are in [facility].  Chart complete.

## 2014-07-07 ENCOUNTER — Inpatient Hospital Stay: Admit: 2014-07-08 | Discharge: 2014-07-15 | Payer: MEDICARE | Source: Home / Self Care | Admitting: Internal Medicine

## 2014-07-07 ENCOUNTER — Emergency Department: Admit: 2014-07-08 | Primary: Internal Medicine

## 2014-07-07 DIAGNOSIS — D709 Neutropenia, unspecified: Secondary | ICD-10-CM

## 2014-07-07 NOTE — H&P (Signed)
History and Physical  Dr. Tasia Catchings  07/07/2014    PCP: Alecia Lemming, DO    Cc: Andrew [name] is a 74 y.o. male who presents with Hepatic encephalopathy ([place]).        HPI: 74 year old male with a history of non-alcoholic cirrhosis who reports that he has been dizzy for a few weeks. His wife states that he has gotten progressively weaker, and that now he cannot stand without assistance. This happened about four years ago when he was in [facility], and he was diagnosed with cirrhosis. At that time he had a high ammonia level. He is seen by Dr. Buena Irish, cardiologist for CHF. Also, he has a DVT in his leg, but because of his liver disease his doctors recommended against anticoagulants. He has not complained of chest pain, SOB, or abdominal pain. His coordination has been poor, and he can't put his glasses on or hold a cup or fork in either hand without spilling. He does not have a tremor. He has fallen several times today without injury, due to inability to stand without assistance. He denies a spinning or rotational sensation.       Pt denies fevers, denies chills.  Pt denies chest pain, denies SOB, complains of nausea, denies vomiting, denies headache.        PMHX:   Past Medical History   Diagnosis Date   ??? Hypertension    ??? Kidney stones    ??? Alpha 1 antitrypsin deficiency    ??? Thrombocytopenia ([place]) 04/30/2009   ??? BPH (benign prostatic hyperplasia)    ??? Cirrhosis, nonalcoholic ([place])      Due to Alpha-1 vs NASH   ??? Chronic systolic CHF (congestive heart failure) ([place]) 10/18/2013   ??? CAD (coronary artery disease)      Non-obstructive   ??? Compression fracture of lumbar vertebra (HCC)      multiple, lumbar   ??? Leukopenia    ??? Anemia    ??? Pneumonia    ??? DVT (deep venous thrombosis) (HCC)        PSHX:   Past Surgical History   Procedure Laterality Date   ??? Av fistula repair  1970's   ??? Colonoscopy  4/12     5y   ??? Cardiac catheterization     ??? Eye surgery  04/12/2014     Laser Eye Surgery for glaucoma   ??? Cosmetic surgery   04/2013     eye lids       SocHx:   History     Social History   ??? Marital Status: Married     Spouse Name: N/A     Number of Children: N/A   ??? Years of Education: N/A     Occupational History   ??? Not on file.     Social History Main Topics   ??? Smoking status: Never Smoker    ??? Smokeless tobacco: Never Used      Comment: Passive smoke exposure: yes   ??? Alcohol Use: No   ??? Drug Use: No   ??? Sexual Activity: Not on file     Other Topics Concern   ??? Not on file     Social History Narrative       FAM HX:   Family History   Problem Relation Age of Onset   ??? Diabetes Mother    ??? Diabetes Sister    ??? Diabetes Brother        Code Status: Prior  Meds - list of home medications reviewed in electronic chart and confirmed  No current facility-administered medications on file prior to encounter.     Current Outpatient Prescriptions on File Prior to Encounter   Medication Sig Dispense Refill   ??? citalopram (CELEXA) 10 MG tablet Take 1 tablet by mouth daily 30 tablet 3   ??? furosemide (LASIX) 40 MG tablet 80 mg in AM, 40 mg PM 90 tablet 5   ??? carvedilol (COREG) 6.25 MG tablet 3.125 mg in AM and 6.25 mg in PM 60 tablet 3   ??? eplerenone (INSPRA) 50 MG tablet Take 1 tablet by mouth daily 30 tablet 3   ??? lisinopril (PRINIVIL;ZESTRIL) 5 MG tablet Take 1 tablet by mouth nightly 90 tablet 3   ??? albuterol sulfate HFA (PROAIR HFA) 108 (90 BASE) MCG/ACT inhaler Inhale 2 puffs into the lungs every 6 hours as needed for Wheezing 1 Inhaler 3   ??? magnesium oxide (MAG-OX) 400 (241.3 MG) MG TABS tablet TAKE ONE TABLET BY MOUTH TWICE DAILY 60 tablet 5   ??? traMADol (ULTRAM) 50 MG tablet Take 1 tablet by mouth every 12 hours as needed for Pain 60 tablet 0   ??? lactulose 20 GM/30ML SOLN Take 30 mLs by mouth 2 times daily 5400 mL 1   ??? Cholecalciferol (VITAMIN D3) 1000 UNITS CAPS Take by mouth daily      ??? MILK THISTLE PO Take  by mouth.     ??? rifaximin (XIFAXAN) 550 MG tablet Take 550 mg by mouth 2 times daily.       ??? Multiple Vitamins-Minerals  (CENTRUM SILVER PO) Take  by mouth daily.             Allergies   Allergen Reactions   ??? Spironolactone Other (See Comments)     gynecomastia       ROS: As noted above in HPI, otherwise 10 system review gone over with patient and is otherwise negative.    Active Hospital Problems    Diagnosis Date Noted   ??? Hepatic encephalopathy ([place]) [K72.90] 07/07/2014   ??? Ataxia [R27.0] 07/07/2014   ??? Pancytopenia ([place]) [D61.818] 07/07/2014   ??? Benign essential HTN [I10] 07/03/2014   ??? Hyperlipidemia [E78.5]    ??? Cirrhosis, nonalcoholic (HCC) [X10.62]          EXAM:  Blood pressure 111/61, pulse 61, temperature 98.7 ??F (37.1 ??C), temperature source Oral, resp. rate 19, height 5\' 5"  (1.651 m), weight 172 lb (78.019 kg), SpO2 95 %.  BP 104/61 mmHg   Pulse 59   Temp(Src) 97.5 ??F (36.4 ??C) (Oral)   Resp 17   Ht 5\' 5"  (1.651 m)   Wt 172 lb (78.019 kg)   BMI 28.62 kg/m2   SpO2 94%    General Appearance:    Alert, cooperative, no distress, appears stated age   Head:    Normocephalic, without obvious abnormality, atraumatic   Eyes:    PERRL, conjunctiva/corneas clear, EOM's intact, fundi     benign, both eyes        Ears:    Normal TM's and external ear canals, both ears   Nose:   Nares normal, septum midline, mucosa normal, no drainage    or sinus tenderness   Throat:   Lips, mucosa, and tongue normal; teeth and gums normal   Neck:   Supple, symmetrical, trachea midline, no adenopathy;        thyroid:  No enlargement/tenderness/nodules; no carotid    bruit or JVD  Back:     Symmetric, no curvature, ROM normal, no CVA tenderness   Lungs:     Clear to auscultation bilaterally, respirations unlabored   Chest wall:    No tenderness or deformity   Heart:    Regular rate and rhythm, S1 and S2 normal, no murmur, rub   or gallop   Abdomen:     Soft, non-tender, bowel sounds active all four quadrants,     no masses, no organomegaly   Genitalia:    Normal male without lesion, discharge or tenderness   Rectal:    Normal tone, normal prostate, no  masses or tenderness;    guaiac negative stool   Extremities:   Extremities normal, atraumatic, no cyanosis or edema   Pulses:   2+ and symmetric all extremities   Skin:   Skin color, texture, turgor normal, no rashes or lesions   Lymph nodes:   Cervical, supraclavicular, and axillary nodes normal   Neurologic:   CNII-XII intact. Normal strength, sensation and reflexes       throughout         LABS:  Labs Reviewed   CBC WITH AUTO DIFFERENTIAL - Abnormal; Notable for the following:     WBC 1.9 (*)     RBC 2.07 (*)     Hemoglobin 8.4 (*)     Hematocrit 24.7 (*)     MCV 119.2 (*)     MCH 40.5 (*)     RDW 18.7 (*)     Platelets 55 (*)     Neutrophils Absolute 0.6 (*)     Macrocytes 1+ (*)     Polychromasia 1+ (*)     Schistocytes Occasional (*)     All other components within normal limits    Narrative:     CALL  [place]  [facility] tel. [phone],  Hematology results called to and read back by rn kelly mckenna er, 07/07/2014  20:08, by [facility]  Hematology results called to and read back by rn kelly mckenna er, 07/07/2014  19:45, by [place] - Abnormal; Notable for the following:     Sodium 135 (*)     Glucose 109 (*)     BUN 22 (*)     All other components within normal limits   URINE RT REFLEX TO CULTURE - Abnormal; Notable for the following:     Leukocyte Esterase, Urine TRACE (*)     All other components within normal limits   AMMONIA - Abnormal; Notable for the following:     Ammonia 70 (*)     All other components within normal limits   PROTIME-INR - Abnormal; Notable for the following:     Protime 14.7 (*)     INR 1.28 (*)     All other components within normal limits   HEPATIC FUNCTION PANEL - Abnormal; Notable for the following:     Total Protein 6.0 (*)     Alb 2.1 (*)     AST 43 (*)     Total Bilirubin 1.2 (*)     Bilirubin, Direct 0.4 (*)     All other components within normal limits   URINE CULTURE   TROPONIN   MICROSCOPIC URINALYSIS         IMAGING:  Imaging studies from the ER have been  reviewed in the computerized chart.  Ct Chest W Contrast    06/13/2014   EXAMINATION: CT OF THE CHEST WITH CONTRAST 06/13/2014 3:56 pm  TECHNIQUE: CT of the chest was performed with the administration of intravenous contrast. Multiplanar reformatted images are provided for review.  COMPARISON: None.  HISTORY: Bacterial pneumonia, unspecified  FINDINGS: Mediastinum:  *A lymph node is seen pretracheal upper mediastinum interposed between the proximal left common carotid artery and brachiocephalic artery, image 31. This measures approximately 12 mm.  Subcarinal density possible subcarinal adenopathy, difficult to separate from adjacent fluid. *While there is no significant cardiac enlargement, there is chamber enlargement, left ventricle and left atrium.  This could indicate mitral valve disease.  Coronary calcification is present. *There is mild coronary calcification. *Vasculature is unremarkable for age. Lungs/pleura:  *Large pleural effusion is seen on the right and small effusion on the left. Multifocal patchy areas of airspace disease are seen bilaterally both upper and lower lobes.  Right lower lobe has a somewhat bronchus centric distribution.  There is bronchiectasis bilaterally.  Focal area of consolidation is seen, right upper lobe anteriorly. *Airways are unremarkable. Soft Tissues/Bones: Bilateral gynecomastia is present.  Small nodular lesion projects from the inferior right thyroid gland.  Sagittal projection demonstrates multiple compression fractures L1 moderate to severe, L2 moderate, T9 moderate and T10 minimal.  Bones are demineralized.  These fractures are faintly noted on prior lateral chest radiograph.  Upper Abdomen: The liver is grossly abnormal, with small overall size, increased density and extensive nodularity throughout the serosal surface. The spleen is mildly enlarged.  There is are extensive unenhanced splenic varices.  Multiple unenhanced gastric and paraesophageal varices are also noted.   Small right renal calculus present     06/13/2014   IMPRESSION: Multifocal airspace disease most likely pneumonia with large pleural effusion primarily on the right.  Chronic bronchiectasis.  Left ventricular and atrial chamber enlargement could indicate mitral valve disease.  Advanced cirrhotic morphology of the liver with portal hypertension and extensive varices, unenhanced on current CT.  Multiple compression fractures essentially unchanged.  Gynecomastia.  Multiple prior moderate compression fractures.     Cta Pulmonary With Contrast    06/27/2014   EXAMINATION: CTA OF THE CHEST 06/27/2014 2:53 pm  TECHNIQUE: CTA of the chest was performed after the administration of intravenous contrast.  Multiplanar reformatted images are provided for review.  MIP images are provided for review.  Patient received 100 mL Isovue 370 intravenous contrast.  COMPARISON: CT chest with contrast June 13, 2014.  HISTORY: ORDERING SYSTEM PROVIDED HISTORY: DVT (deep venous thrombosis), unspecified laterality [facility]) TECHNOLOGIST PROVIDED HISTORY: Ordering Physician Provided Reason for Exam: DVT, SOB Injury/Trauma or Illness: Illness/Other Acuity: Acute Type of Exam: Initial Relevant Medica/Surgical History: CHF, cardiomyopathy  FINDINGS: Pulmonary Arteries: Pulmonary arteries are adequately opacified for evaluation.  No evidence of intraluminal filling defect to suggest pulmonary embolism.  Main pulmonary artery is normal in caliber.  Mediastinum: No evidence of mediastinal lymphadenopathy.  Heart is enlarged, similar in appearance.  The heart and pericardium demonstrate no acute abnormality.  There is no acute abnormality of the thoracic aorta. Nodularity of thyroid similar in appearance.  Lungs/pleura: The central airways are patent.  There is a moderate right-sided pleural effusion which appears slightly decreased compared to prior study.  There is mild patchy dependent opacities in the right upper lobe, similar in appearance.  There are  dependent subpleural opacities within the lingula and left lower lobe.  There is mild opacity within the right middle lobe which is linear in appearance in appears slightly improved compared to prior study.  2 nodular appearing densities are again seen in the right lower  lobe on image number 65, unchanged.  No pneumothorax.  Upper Abdomen: Cirrhotic liver with mildly distended gallbladder is similar in appearance to prior study.  Soft Tissues/Bones: Multiple compression fractures again seen, most pronounced at the thoracolumbar junction, similar in appearance.  Old right-sided rib fractures are also seen.  No acute bone or soft tissue abnormality.  Gynecomastia is again seen.     06/27/2014   IMPRESSION: No evidence of pulmonary embolism.  Moderate right pleural effusion, minimally decreased.  Persistent patchy areas of airspace opacity which may be related to atelectasis versus pneumonia, slightly improved.  2 nodular appearing densities are seen within the right lower lobe which may be related to the airspace disease or represent lung nodules.  Follow-up recommended with dedicated chest CT in 3 months.  Remote compression fractures again seen.  Cirrhotic liver.     Vl Extremity Venous Bilateral    06/27/2014   Lower Extremities DVT Study   Demographics    Patient Name       KIANDRE SPAGNOLO    Date of Study      06/27/2014        Gender              Male    Patient Number     [id number]        Date of Birth       September 23, 1940    Visit Number       [id number]       Age                 65 year(s)    Accession Number   [id number]         Room Number    Corporate ID       [id number]          Sonographer         Erasmo Leventhal,                                                           [facility]    Ordering Physician Lacretia Leigh, MD  Interpreting        [facility]                                       Physician           Darlyn Chamber MD,                                                           [facility]   Procedure  Type of Study:     Veins:Lower Extremities DVT Study, VASC EXTREMITY VENOUS DUPLEX BILATERAL.    Vascular Sonographer Report   Additional Indications:Swelliing  Impressions Right Impression Acute partially occluding deep vein thrombosis involving the right set of gastroc veins zone 5-6. No other evidence of deep vein or superficial vein thrombosis involving the right lower extremity. Reflux noted in the right FV. Left Impression No evidence of deep vein or superficial vein thrombosis involving the left lower extremity.  Verbal to Dr. Jeb Levering. Patient released.  Conclusions    Summary    -Acute partially occluding deep vein thrombosis involving the right set of  gastroc veins zone 5-6.  -Reflux noted in the right FV.    Signature    ------------------------------------------------------------------  Electronically signed by Darlyn Chamber MD, [facility] (Interpreting  physician) on 06/27/2014 at 01:38 PM  ------------------------------------------------------------------   Patient Status:Routine. [facility] - Vascular Lab. Technical Quality:Adequate visualization.  Velocities are measured in cm/s ; Diameters are measured in mm  Right Lower Extremities DVT Study Measurements Right 2D Measurements +------------------------+----------+---------------+----------+ !Location                !Visualized!Compressibility!Thrombosis! +------------------------+----------+---------------+----------+ !Sapheno Femoral Junction!Yes       !Yes            !None      ! +------------------------+----------+---------------+----------+ !GSV Thigh               !Yes       !Yes            !None      ! +------------------------+----------+---------------+----------+ !Common Femoral          !Yes       !Yes            !None      ! +------------------------+----------+---------------+----------+ !Prox Femoral            !Yes       !Yes            !None      ! +------------------------+----------+---------------+----------+ !Mid Femoral              !Yes       !Yes            !None      ! +------------------------+----------+---------------+----------+ !Dist Femoral            !Yes       !Yes            !None      ! +------------------------+----------+---------------+----------+ !Deep Femoral            !Yes       !Yes            !None      ! +------------------------+----------+---------------+----------+ !Popliteal               !Yes       !Yes            !None      ! +------------------------+----------+---------------+----------+ !GSV Below Knee          !Yes       !Yes            !None      ! +------------------------+----------+---------------+----------+ !Gastroc                 !Yes       !Partial        !Acute     ! +------------------------+----------+---------------+----------+ !PTV                     !Yes       !Yes            !None      ! +------------------------+----------+---------------+----------+ !Peroneal                !Yes       !Yes            !None      ! +------------------------+----------+---------------+----------+ !  SSV                     !Yes       !Yes            !None      ! +------------------------+----------+---------------+----------+  Right Doppler Measurements +------------------------+------+------+------------+ !Location                !Signal!Reflux!Reflux (sec)! +------------------------+------+------+------------+ !Sapheno Femoral Junction!Phasic!No    !            ! +------------------------+------+------+------------+ !Common Femoral          !Phasic!No    !            ! +------------------------+------+------+------------+ !Femoral                 !Phasic!Yes   !            ! +------------------------+------+------+------------+ !Deep Femoral            !Phasic!No    !            ! +------------------------+------+------+------------+ !Popliteal               !Phasic!No    !            ! +------------------------+------+------+------------+  Left Lower Extremities DVT Study Measurements Left 2D Measurements  +------------------------+----------+---------------+----------+ !Location                !Visualized!Compressibility!Thrombosis! +------------------------+----------+---------------+----------+ !Sapheno Femoral Junction!Yes       !Yes            !None      ! +------------------------+----------+---------------+----------+ !GSV Thigh               !Yes       !Yes            !None      ! +------------------------+----------+---------------+----------+ !Common Femoral          !Yes       !Yes            !None      ! +------------------------+----------+---------------+----------+ !Prox Femoral            !Yes       !Yes            !None      ! +------------------------+----------+---------------+----------+ !Mid Femoral             !Yes       !Yes            !None      ! +------------------------+----------+---------------+----------+ !Dist Femoral            !Yes       !Yes            !None      ! +------------------------+----------+---------------+----------+ !Deep Femoral            !Yes       !Yes            !None      ! +------------------------+----------+---------------+----------+ !Popliteal               !Yes       !Yes            !None      ! +------------------------+----------+---------------+----------+ !GSV Below Knee          !Yes       !Yes            !None      ! +------------------------+----------+---------------+----------+ !  Gastroc                 !Yes       !Yes            !None      ! +------------------------+----------+---------------+----------+ !PTV                     !Yes       !Yes            !None      ! +------------------------+----------+---------------+----------+ !Peroneal                !Yes       !Yes            !None      ! +------------------------+----------+---------------+----------+ !SSV                     !Yes       !Yes            !None      ! +------------------------+----------+---------------+----------+  Left Doppler Measurements  +------------------------+------+------+------------+ !Location                !Signal!Reflux!Reflux (sec)! +------------------------+------+------+------------+ !Sapheno Femoral Junction!Phasic!No    !            ! +------------------------+------+------+------------+ !Common Femoral          !Phasic!No    !            ! +------------------------+------+------+------------+ !Femoral                 !Phasic!No    !            ! +------------------------+------+------+------------+ !Deep Femoral            !Phasic!No    !            ! +------------------------+------+------+------------+ !Popliteal               !Phasic!No    !            ! +------------------------+------+------+------------+      ASSESSMENT AND PLAN:  Patient Active Hospital Problem List:   Hepatic encephalopathy ([place]) (07/07/2014)    Assessment: pt with elevated ammonia, hx of this in the past    Plan: admit, on xifixan, lactulose ordered   Cirrhosis, nonalcoholic ([place]) ()    Assessment: Stable    Plan: Continue present orders/plan   Hyperlipidemia ()    Assessment: Stable    Plan: Continue present orders/plan   Benign essential HTN (07/03/2014)    Assessment: Stable    Plan: Continue present orders/plan   Ataxia (07/07/2014)    Assessment: unclear of cause, could be due to ammonia?    Plan: get pt/ot eval   Pancytopenia ([employer]) (07/07/2014)    Assessment: new finding    Plan: ask Heme to see            Hypokalemia - pt placed on sliding scale K for suspected low K levels   Hyponatremia - pt to have maintenance NS ordered for when hyponatremia is suspected    The patient is being placed in inpatient status with the expectation of requiring a hospital stay spanning at least two midnights for care and treatment of the problems noted in the problem list.  This determination is also based on the patients comorbidities and past medical history, the severity and timing of the signs and symptoms upon presentation.    Section for Face-to-Face requirement  documentation    This portion of the note is to certify that I have had a face-to-face encounter with this patient. See above documentation for notes on actual encounter.    Please refer to the written order for the DME which includes;  1. the beneficiary's name,  2. the item of DME ordered,  3. the prescribing practitioner's National Provider Identifier (NPI),  4. the signature of the ordering practitioner and  5. the date of the order.    Per CMS Pub [address] [place] Medicare Program Integrity, [phone]; Issued: 08-06-11; Effective: 09-06-11; Implementation: 09-06-11): A single confirming signature and date is sufficient in a situation where there are several pertinent portions of the medical record.     The electronic signature on this note shall serve as the confirming signature that the beneficiary was evaluated and/or treated for a condition that supports the need for the item(s) of DME ordered.  Please refer to the appropriate sections of the medical record for documentation of the need for the ordered DME.      Any additional notes for the DME order to be attached below this line:      Deniece Portela  07/07/2014

## 2014-07-07 NOTE — ED Notes (Signed)
Pt straight stuck for ammonia. Pt blood work obtained and sent to lab. Pt in bed with side rails up x 2, bed in low position and call light in reach.     Elmer Bales, RN  07/07/14 [phone]

## 2014-07-07 NOTE — ED Provider Notes (Signed)
HPI Comments: Andrew [name] is a 74 year old male with a history of non-alcoholic cirrhosis who reports that he has been dizzy for a few weeks. His wife states that he has gotten progressively weaker, and that now he cannot stand without assistance. This happened about four years ago when he was in [facility], and he was diagnosed with cirrhosis. At that time he had a high ammonia level. He is seen by Dr. Buena Irish, cardiologist for CHF. Also, he has a DVT in his leg, but because of his liver disease his doctors recommended against anticoagulants. He has not complained of chest pain, SOB, or abdominal pain. His coordination has been poor, and he can't put his glasses on or hold a cup or fork in either hand without spilling. He does not have a tremor. He has fallen several times today without injury, due to inability to stand without assistance. He denies a spinning or rotational sensation.     The history is provided by the patient.     BP 95/55 mmHg   Pulse 68   Temp(Src) 98.7 ??F (37.1 ??C) (Oral)   Resp 15   Ht 5\' 5"  (1.651 m)   Wt 172 lb (78.019 kg)   BMI 28.62 kg/m2   SpO2 94%    I have reviewed the following from the nursing documentation:      Prior to Admission medications    Medication Sig Start Date End Date Taking? Authorizing Provider   citalopram (CELEXA) 10 MG tablet Take 1 tablet by mouth daily 07/03/14  Yes Scott A Kotzin, DO   furosemide (LASIX) 40 MG tablet 80 mg in AM, 40 mg PM 06/25/14  Yes Princess Bruins, NP   carvedilol (COREG) 6.25 MG tablet 3.125 mg in AM and 6.25 mg in PM 06/24/14  Yes Princess Bruins, NP   eplerenone (INSPRA) 50 MG tablet Take 1 tablet by mouth daily 06/24/14  Yes Princess Bruins, NP   lisinopril (PRINIVIL;ZESTRIL) 5 MG tablet Take 1 tablet by mouth nightly 05/21/14  Yes Donata Duff, NP   albuterol sulfate HFA (PROAIR HFA) 108 (90 BASE) MCG/ACT inhaler Inhale 2 puffs into the lungs every 6 hours as needed for Wheezing 05/14/14  Yes Scott A Kotzin, DO    magnesium oxide (MAG-OX) 400 (241.3 MG) MG TABS tablet TAKE ONE TABLET BY MOUTH TWICE DAILY 04/26/14  Yes Scott A Kotzin, DO   traMADol (ULTRAM) 50 MG tablet Take 1 tablet by mouth every 12 hours as needed for Pain 03/07/14  Yes Scott A Kotzin, DO   lactulose 20 GM/30ML SOLN Take 30 mLs by mouth 2 times daily 08/13/13  Yes Scott A Kotzin, DO   Cholecalciferol (VITAMIN D3) 1000 UNITS CAPS Take by mouth daily    Yes Historical Provider, MD   MILK THISTLE PO Take  by mouth.   Yes Historical Provider, MD   rifaximin (XIFAXAN) 550 MG tablet Take 550 mg by mouth 2 times daily.     Yes Historical Provider, MD   Multiple Vitamins-Minerals (CENTRUM SILVER PO) Take  by mouth daily. 08/23/08  Yes Historical Provider, MD       Allergies as of 07/07/2014 - Review Complete 07/07/2014   Allergen Reaction Noted   ??? Spironolactone Other (See Comments) 06/24/2014       Past Medical History   Diagnosis Date   ??? Hypertension    ??? Kidney stones    ??? Alpha 1 antitrypsin deficiency    ??? Thrombocytopenia ([place])  04/30/2009   ??? BPH (benign prostatic hyperplasia)    ??? Cirrhosis, nonalcoholic ([place])      Due to Alpha-1 vs NASH   ??? Chronic systolic CHF (congestive heart failure) ([place]) 10/18/2013   ??? CAD (coronary artery disease)      Non-obstructive   ??? Compression fracture of lumbar vertebra (HCC)      multiple, lumbar   ??? Leukopenia    ??? Anemia    ??? Pneumonia    ??? DVT (deep venous thrombosis) [facility])         Surgical History:   Past Surgical History   Procedure Laterality Date   ??? Av fistula repair  1970's   ??? Colonoscopy  4/12     5y   ??? Cardiac catheterization     ??? Eye surgery  04/12/2014     Laser Eye Surgery for glaucoma   ??? Cosmetic surgery  04/2013     eye lids        Family History:    Family History   Problem Relation Age of Onset   ??? Diabetes Mother    ??? Diabetes Sister    ??? Diabetes Brother        History     Social History   ??? Marital Status: Married     Spouse Name: N/A     Number of Children: N/A   ??? Years of Education: N/A      Occupational History   ??? Not on file.     Social History Main Topics   ??? Smoking status: Never Smoker    ??? Smokeless tobacco: Never Used      Comment: Passive smoke exposure: yes   ??? Alcohol Use: No   ??? Drug Use: No   ??? Sexual Activity: Not on file     Other Topics Concern   ??? Not on file     Social History Narrative       Review of Systems   Constitutional: Negative for fever, chills, diaphoresis, activity change, appetite change and fatigue.   HENT: Negative.  Negative for congestion, dental problem, ear pain, facial swelling, rhinorrhea, sinus pressure, sneezing, sore throat, tinnitus, trouble swallowing and voice change.    Eyes: Negative for photophobia, pain, discharge, redness, itching and visual disturbance.   Respiratory: Negative for cough, chest tightness, shortness of breath, wheezing and stridor.    Cardiovascular: Negative for chest pain, palpitations and leg swelling.   Gastrointestinal: Negative for nausea, vomiting, abdominal pain, diarrhea, constipation, blood in stool, abdominal distention and anal bleeding.   Genitourinary: Negative for dysuria, urgency, frequency, hematuria, discharge, difficulty urinating and testicular pain.   Musculoskeletal: Positive for gait problem. Negative for back pain, joint swelling, neck pain and neck stiffness.   Skin: Negative for rash and wound.   Neurological: Positive for dizziness and weakness. Negative for syncope, facial asymmetry, speech difficulty, numbness and headaches.   Hematological: Does not bruise/bleed easily.   Psychiatric/Behavioral: Negative for suicidal ideas, hallucinations, confusion, sleep disturbance, self-injury and agitation. The patient is not nervous/anxious.    All other systems reviewed and are negative.      Physical Exam   Constitutional: He is oriented to person, place, and time. He appears well-developed and well-nourished. No distress.   HENT:   Head: Normocephalic and atraumatic.   Right Ear: External ear normal.   Left Ear:  External ear normal.   Nose: Nose normal.   Mouth/Throat: Oropharynx is clear and moist. No oropharyngeal exudate.   Eyes:  Conjunctivae and EOM are normal. Pupils are equal, round, and reactive to light. Right eye exhibits no discharge. Left eye exhibits no discharge. No scleral icterus.   Neck: Normal range of motion. Neck supple. No JVD present. No tracheal deviation present.   Cardiovascular: Normal rate, regular rhythm, normal heart sounds and intact distal pulses.  Exam reveals no gallop and no friction rub.    No murmur heard.  Pulmonary/Chest: Effort normal and breath sounds normal. No respiratory distress. He has no wheezes. He has no rales.   Abdominal: Soft. Bowel sounds are normal. He exhibits no distension and no mass. There is no tenderness. There is no rebound and no guarding.   Musculoskeletal: Normal range of motion. He exhibits no edema or tenderness.   Lymphadenopathy:     He has no cervical adenopathy.   Neurological: He is alert and oriented to person, place, and time. He has normal strength. No cranial nerve deficit or sensory deficit. He exhibits normal muscle tone. Coordination normal. GCS eye subscore is 4. GCS verbal subscore is 5. GCS motor subscore is 6. He displays no Babinski's sign on the right side. He displays no Babinski's sign on the left side.   Reflex Scores:       Bicep reflexes are 1+ on the right side and 1+ on the left side.       Patellar reflexes are 1+ on the right side and 1+ on the left side.       Achilles reflexes are 1+ on the right side and 1+ on the left side.  Coordination, speech, balance and cognition are intact.  There is no nuchal rigidity or evidence of meningismus. Negative Kernig's and Brudzinski's signs.  All sensory and motor components of the brachial/lumbosacral plexus tested are symmetric and intact. No focal deficits appreciated.      Skin: Skin is warm and dry. No rash noted. No erythema. No pallor.   Psychiatric: He has a normal mood and affect. His  behavior is normal. Judgment and thought content normal.   Nursing note and vitals reviewed.      Procedures    MDM  Results for orders placed or performed during the hospital encounter of 07/07/14   CBC auto differential   Result Value Ref Range    WBC 1.9 (LL) 4.0 - 11.0 K/uL    RBC 2.07 (L) 4.20 - 5.90 M/uL    Hemoglobin 8.4 (L) 13.5 - 17.5 g/dL    Hematocrit 24.7 (L) 40.5 - 52.5 %    MCV 119.2 (H) 80.0 - 100.0 fL    MCH 40.5 (H) 26.0 - 34.0 pg    MCHC 34.0 31.0 - 36.0 g/dL    RDW 18.7 (H) 12.4 - 15.4 %    Platelets 55 (L) 135 - 450 K/uL    MPV 8.8 5.0 - 10.5 fL    PLATELET SLIDE REVIEW Decreased     SLIDE REVIEW see below     Path Consult Yes     Neutrophils Relative 33.0 %    Lymphocytes Relative 58.0 %    Monocytes Relative 9.0 %    Eosinophils Relative Percent 0.0 %    Basophils Relative 0.0 %    Neutrophils Absolute 0.6 (LL) 1.7 - 7.7 K/uL    Lymphocytes Absolute 1.1 1.0 - 5.1 K/uL    Monocytes Absolute 0.2 0.0 - 1.3 K/uL    Eosinophils Absolute 0.0 0.0 - 0.6 K/uL    Basophils Absolute 0.0 0.0 - 0.2 K/uL  Macrocytes 1+ (A)     Polychromasia 1+ (A)     Schistocytes Occasional (A)    Basic metabolic panel   Result Value Ref Range    Sodium 135 (L) 136 - 145 mmol/L    Potassium 4.7 3.5 - 5.1 mmol/L    Chloride 100 99 - 110 mmol/L    CO2 28 21 - 32 mmol/L    Anion Gap 7 3 - 16    Glucose 109 (H) 70 - 99 mg/dL    BUN 22 (H) 7 - 20 mg/dL    CREATININE 0.8 0.8 - 1.3 mg/dL    GFR Non-African American >60 >60    GFR African American >60 >60    Calcium 8.6 8.3 - 10.6 mg/dL   Troponin   Result Value Ref Range    Troponin <0.01 <0.01 ng/mL   Urine, reflex to culture   Result Value Ref Range    Color, UA YELLOW Straw/Yellow    Clarity, UA Clear Clear    Glucose, Ur Negative Negative mg/dL    Bilirubin Urine Negative Negative    Ketones, Urine Negative Negative mg/dL    Specific Gravity, UA 1.014 1.005-1.030    Blood, Urine Negative Negative    pH, UA 6.5 5.0-8.0    Protein, UA Negative Negative mg/dL    Urobilinogen,  Urine 1.0 <2.0 E.U./dL    Nitrite, Urine Negative Negative    Leukocyte Esterase, Urine TRACE (A) Negative    Microscopic Examination YES     Urine Reflex to Culture Yes     Urine Type Not Specified    Microscopic Urinalysis   Result Value Ref Range    Hyaline Casts, UA 0 0 - 8 /HPF    WBC, UA 1 0 - 5 /HPF    RBC, UA 2 0 - 4 /HPF    Epi Cells 0 0 - 5 /HPF   Ammonia   Result Value Ref Range    Ammonia 70 (H) 16 - 60 umol/L   Protime-INR   Result Value Ref Range    Protime 14.7 (H) 9.8 - 13.0 sec    INR 1.28 (H) 0.85 - 1.16   Hepatic Function Panel   Result Value Ref Range    Total Protein 6.0 (L) 6.4 - 8.2 g/dL    Alb 2.1 (L) 3.4 - 5.0 g/dL    Alkaline Phosphatase 129 40 - 129 U/L    ALT 20 10 - 40 U/L    AST 43 (H) 15 - 37 U/L    Total Bilirubin 1.2 (H) 0.0 - 1.0 mg/dL    Bilirubin, Direct 0.4 (H) 0.0 - 0.3 mg/dL    Bilirubin, Indirect 0.8 0.0 - 1.0 mg/dL       I spoke with Dr. Gabriel Earing. We thoroughly discussed the history, physical exam, laboratory and imaging studies, as well as, emergency department course. Based upon that discussion, we've decided to admit Andrew [name] for further observation and evaluation of Andrew [name]'s mental status change.  As I have deemed necessary from their history, physical and studies, I have considered and evaluated Andrew [name] for the following diagnoses:  DIABETES, INTRACRANIAL HEMORRHAGE, MENINGITIS, SEPSIS SUBARACHNOID HEMORRHAGE, SUBDURAL HEMATOMA, & STROKE.      FINAL IMPRESSION  1. Neutropenia, unspecified type ([place])    2. Anemia, unspecified type    3. Thrombocytopenia ([place])    4. Increased ammonia level    5. Elevated LFTs    6. Dizziness    7. Generalized weakness  8. Ataxia    9. Difficulty walking        Vitals:  Blood pressure 111/61, pulse 61, temperature 98.7 ??F (37.1 ??C), temperature source Oral, resp. rate 19, height 5\' 5"  (1.651 m), weight 172 lb (78.019 kg), SpO2 95 %.          Radiology  Ct Head Wo Contrast    07/07/2014   EXAMINATION: CT OF THE HEAD WITHOUT  CONTRAST  07/07/2014 11:11 pm  TECHNIQUE: CT of the head was performed without the administration of intravenous contrast.  COMPARISON: 04/01/2014  HISTORY: ORDERING SYSTEM PROVIDED HISTORY: dizzy TECHNOLOGIST PROVIDED HISTORY: Ordering Physician Provided Reason for Exam: dizzy  FINDINGS: BRAIN/VENTRICLES: There is no acute intracranial hemorrhage, mass effect or midline shift.  No abnormal extra-axial fluid collection.  The gray-white differentiation is maintained without evidence of an acute infarct.  There is no evidence of hydrocephalus.  ORBITS: The visualized portion of the orbits demonstrate no acute abnormality.  SINUSES: There is minimal mucosal thickening in the left maxillary sinus. The remaining visualized paranasal sinuses are clear.  The mastoid air cells are unremarkable.  SOFT TISSUES/SKULL:  No acute abnormality of the visualized skull or soft tissues.     07/07/2014   IMPRESSION: No acute intracranial abnormality.         EKG Interpretation.  The Ekg interpreted by me in the absence of a cardiologist shows.  normal sinus rhythm with a rate of 68  Axis is   Normal  QTc is  within an acceptable range  Intervals and Durations are unremarkable.      No specific ST-T wave changes appreciated.  No evidence of acute ischemia.   No significant change from prior EKG dated 01 May 2014.           Heriberto Antigua, MD  07/08/14 0001

## 2014-07-08 ENCOUNTER — Inpatient Hospital Stay: Admit: 2014-07-08 | Primary: Internal Medicine

## 2014-07-08 ENCOUNTER — Encounter: Attending: Adult Health | Primary: Internal Medicine

## 2014-07-08 ENCOUNTER — Encounter: Attending: Internal Medicine | Primary: Internal Medicine

## 2014-07-08 LAB — CBC WITH AUTO DIFFERENTIAL
Basophils %: 0.7 %
Basophils Absolute: 0 10*3/uL (ref 0.0–0.2)
Eosinophils %: 1 %
Eosinophils Absolute: 0 10*3/uL (ref 0.0–0.6)
Hematocrit: 23 % — ABNORMAL LOW (ref 40.5–52.5)
Hemoglobin: 7.7 g/dL — ABNORMAL LOW (ref 13.5–17.5)
Lymphocytes %: 62.5 %
Lymphocytes Absolute: 1.3 10*3/uL (ref 1.0–5.1)
MCH: 40.3 pg — ABNORMAL HIGH (ref 26.0–34.0)
MCHC: 33.5 g/dL (ref 31.0–36.0)
MCV: 120.4 fL — ABNORMAL HIGH (ref 80.0–100.0)
MPV: 9.1 fL (ref 5.0–10.5)
Monocytes %: 10.3 %
Monocytes Absolute: 0.2 10*3/uL (ref 0.0–1.3)
Neutrophils %: 25.5 %
Neutrophils Absolute: 0.5 10*3/uL — CL (ref 1.7–7.7)
PLATELET SLIDE REVIEW: DECREASED
Platelets: 47 10*3/uL — ABNORMAL LOW (ref 135–450)
RBC: 1.91 M/uL — ABNORMAL LOW (ref 4.20–5.90)
RDW: 18.5 % — ABNORMAL HIGH (ref 12.4–15.4)
WBC: 2 10*3/uL — ABNORMAL LOW (ref 4.0–11.0)

## 2014-07-08 LAB — BASIC METABOLIC PANEL
Anion Gap: 6 (ref 3–16)
Anion Gap: 7 (ref 3–16)
BUN: 20 mg/dL (ref 7–20)
BUN: 22 mg/dL — ABNORMAL HIGH (ref 7–20)
CO2: 28 mmol/L (ref 21–32)
CO2: 28 mmol/L (ref 21–32)
Calcium: 8.3 mg/dL (ref 8.3–10.6)
Calcium: 8.6 mg/dL (ref 8.3–10.6)
Chloride: 100 mmol/L (ref 99–110)
Chloride: 102 mmol/L (ref 99–110)
Creatinine: 0.8 mg/dL (ref 0.8–1.3)
Creatinine: 0.8 mg/dL (ref 0.8–1.3)
GFR African American: >60 (ref >60)
GFR African American: >60 (ref >60)
GFR Non-African American: >60 (ref >60)
GFR Non-African American: >60 (ref >60)
Glucose: 100 mg/dL — ABNORMAL HIGH (ref 70–99)
Glucose: 109 mg/dL — ABNORMAL HIGH (ref 70–99)
Potassium: 4 mmol/L (ref 3.5–5.1)
Potassium: 4.7 mmol/L (ref 3.5–5.1)
Sodium: 135 mmol/L — ABNORMAL LOW (ref 136–145)
Sodium: 136 mmol/L (ref 136–145)

## 2014-07-08 LAB — AMMONIA
Ammonia: 70 umol/L — ABNORMAL HIGH (ref 16–60)
Ammonia: 113 umol/L (ref 16–60)

## 2014-07-08 LAB — MICROSCOPIC URINALYSIS
Epithelial Cells, UA: 0 /HPF (ref 0–5)
Hyaline Casts, UA: 0 /HPF (ref 0–8)
RBC, UA: 2 /HPF (ref 0–4)
WBC, UA: 1 /HPF (ref 0–5)

## 2014-07-08 LAB — URINALYSIS WITH REFLEX TO CULTURE
Bilirubin Urine: NEGATIVE
Blood, Urine: NEGATIVE
Glucose, Ur: NEGATIVE mg/dL
Ketones, Urine: NEGATIVE mg/dL
Nitrite, Urine: NEGATIVE
Protein, UA: NEGATIVE mg/dL
Specific Gravity, UA: 1.014 (ref 1.005–1.030)
Urobilinogen, Urine: 1 E.U./dL (ref <2.0)
pH, UA: 6.5 (ref 5.0–8.0)

## 2014-07-08 LAB — EKG 12-LEAD
Atrial Rate: 68 {beats}/min
P Axis: 5 degrees
P-R Interval: 164 ms
Q-T Interval: 420 ms
QRS Duration: 104 ms
QTc Calculation (Bazett): 446 ms
R Axis: -17 degrees
T Axis: 19 degrees
Ventricular Rate: 68 {beats}/min

## 2014-07-08 LAB — HEPATIC FUNCTION PANEL
ALT: 20 U/L (ref 10–40)
AST: 43 U/L — ABNORMAL HIGH (ref 15–37)
Albumin: 2.1 g/dL — ABNORMAL LOW (ref 3.4–5.0)
Alkaline Phosphatase: 129 U/L (ref 40–129)
Bilirubin, Direct: 0.4 mg/dL — ABNORMAL HIGH (ref 0.0–0.3)
Bilirubin, Indirect: 0.8 mg/dL (ref 0.0–1.0)
Total Bilirubin: 1.2 mg/dL — ABNORMAL HIGH (ref 0.0–1.0)
Total Protein: 6 g/dL — ABNORMAL LOW (ref 6.4–8.2)

## 2014-07-08 LAB — RETICULOCYTES
Hematocrit: 24.7 % — ABNORMAL LOW (ref 40.5–52.5)
Immature Retic Fract: 0.63 — ABNORMAL HIGH (ref 0.21–0.37)
Retic Ct Abs: 0.032 M/uL
Retic Ct Pct: 1.54 % (ref 0.50–2.18)

## 2014-07-08 LAB — PROTIME-INR
INR: 1.28 — ABNORMAL HIGH (ref 0.85–1.16)
Protime: 14.7 s — ABNORMAL HIGH (ref 9.8–13.0)

## 2014-07-08 LAB — VITAMIN B12 & FOLATE
Folate: 19.76 ng/mL — ABNORMAL HIGH (ref 3.10–17.50)
Vitamin B-12: >2000 pg/mL — ABNORMAL HIGH (ref 211–911)

## 2014-07-08 LAB — TROPONIN: Troponin: <0.01 ng/mL (ref <0.01)

## 2014-07-08 MED ORDER — NORMAL SALINE FLUSH 0.9 % IV SOLN
0.9 % | Freq: Two times a day (BID) | INTRAVENOUS | Status: DC
Start: 2014-07-08 — End: 2014-07-15
  Administered 2014-07-08 – 2014-07-15 (×15): 10 mL via INTRAVENOUS

## 2014-07-08 MED ORDER — CARVEDILOL 6.25 MG PO TABS
6.25 MG | Freq: Every evening | ORAL | Status: DC
Start: 2014-07-08 — End: 2014-07-08

## 2014-07-08 MED ORDER — CARVEDILOL 6.25 MG PO TABS
6.25 MG | Freq: Every evening | ORAL | Status: DC
Start: 2014-07-08 — End: 2014-07-15
  Administered 2014-07-09 – 2014-07-12 (×4): 6.25 mg via ORAL

## 2014-07-08 MED ORDER — POTASSIUM CHLORIDE 20 MEQ/15ML (10%) PO SOLN
20 MEQ/15ML (10%) | ORAL | Status: DC | PRN
Start: 2014-07-08 — End: 2014-07-08

## 2014-07-08 MED ORDER — ONDANSETRON HCL 4 MG/2ML IJ SOLN
4 MG/2ML | Freq: Four times a day (QID) | INTRAMUSCULAR | Status: DC | PRN
Start: 2014-07-08 — End: 2014-07-15

## 2014-07-08 MED ORDER — CARVEDILOL 3.125 MG PO TABS
3.125 MG | Freq: Every day | ORAL | Status: DC
Start: 2014-07-08 — End: 2014-07-08

## 2014-07-08 MED ORDER — EPLERENONE 25 MG PO TABS
25 MG | Freq: Every day | ORAL | Status: DC
Start: 2014-07-08 — End: 2014-07-15
  Administered 2014-07-09 – 2014-07-12 (×3): 50 mg via ORAL

## 2014-07-08 MED ORDER — NORMAL SALINE FLUSH 0.9 % IV SOLN
0.9 % | INTRAVENOUS | Status: DC | PRN
Start: 2014-07-08 — End: 2014-07-15

## 2014-07-08 MED ORDER — RIFAXIMIN 550 MG PO TABS
550 MG | Freq: Two times a day (BID) | ORAL | Status: DC
Start: 2014-07-08 — End: 2014-07-15
  Administered 2014-07-08 – 2014-07-15 (×16): 550 mg via ORAL

## 2014-07-08 MED ORDER — POTASSIUM CHLORIDE CRYS ER 20 MEQ PO TBCR
20 MEQ | ORAL | Status: DC | PRN
Start: 2014-07-08 — End: 2014-07-08

## 2014-07-08 MED ORDER — MORPHINE SULFATE (PF) 2 MG/ML IV SOLN
2 MG/ML | INTRAVENOUS | Status: DC | PRN
Start: 2014-07-08 — End: 2014-07-15

## 2014-07-08 MED ORDER — POTASSIUM CHLORIDE 10 MEQ/100ML IV SOLN
10 MEQ/0ML | INTRAVENOUS | Status: DC | PRN
Start: 2014-07-08 — End: 2014-07-15

## 2014-07-08 MED ORDER — POTASSIUM CHLORIDE 10 MEQ/100ML IV SOLN
10 MEQ/0ML | INTRAVENOUS | Status: DC | PRN
Start: 2014-07-08 — End: 2014-07-08

## 2014-07-08 MED ORDER — POTASSIUM CHLORIDE CRYS ER 20 MEQ PO TBCR
20 MEQ | ORAL | Status: DC | PRN
Start: 2014-07-08 — End: 2014-07-15
  Administered 2014-07-13: 12:00:00 40 meq via ORAL

## 2014-07-08 MED ORDER — ALBUTEROL SULFATE HFA 108 (90 BASE) MCG/ACT IN AERS
108 (90 Base) MCG/ACT | Freq: Four times a day (QID) | RESPIRATORY_TRACT | Status: DC | PRN
Start: 2014-07-08 — End: 2014-07-11

## 2014-07-08 MED ORDER — CARVEDILOL 6.25 MG PO TABS
6.25 MG | Freq: Two times a day (BID) | ORAL | Status: DC
Start: 2014-07-08 — End: 2014-07-08

## 2014-07-08 MED ORDER — LACTULOSE 10 GM/15ML PO SOLN
10 GM/15ML | Freq: Three times a day (TID) | ORAL | Status: DC
Start: 2014-07-08 — End: 2014-07-08

## 2014-07-08 MED ORDER — MAGNESIUM HYDROXIDE 400 MG/5ML PO SUSP
400 MG/5ML | Freq: Every day | ORAL | Status: DC | PRN
Start: 2014-07-08 — End: 2014-07-15

## 2014-07-08 MED ORDER — LACTULOSE 10 GM/15ML PO SOLN
10 GM/15ML | Freq: Four times a day (QID) | ORAL | Status: DC
Start: 2014-07-08 — End: 2014-07-08
  Administered 2014-07-08 (×2): 20 g via ORAL

## 2014-07-08 MED ORDER — HYDROCODONE-ACETAMINOPHEN 5-325 MG PO TABS
5-325 MG | ORAL | Status: DC | PRN
Start: 2014-07-08 — End: 2014-07-15

## 2014-07-08 MED ORDER — CARVEDILOL 3.125 MG PO TABS
3.125 MG | Freq: Every day | ORAL | Status: DC
Start: 2014-07-08 — End: 2014-07-15
  Administered 2014-07-09 – 2014-07-12 (×3): 3.125 mg via ORAL

## 2014-07-08 MED ORDER — FAMOTIDINE 20 MG PO TABS
20 MG | Freq: Two times a day (BID) | ORAL | Status: DC | PRN
Start: 2014-07-08 — End: 2014-07-15
  Administered 2014-07-08: 06:00:00 20 mg via ORAL

## 2014-07-08 MED ORDER — EPLERENONE 25 MG PO TABS
25 MG | Freq: Every day | ORAL | Status: DC
Start: 2014-07-08 — End: 2014-07-08

## 2014-07-08 MED ORDER — ACETAMINOPHEN 325 MG PO TABS
325 MG | ORAL | Status: DC | PRN
Start: 2014-07-08 — End: 2014-07-15

## 2014-07-08 MED ORDER — POTASSIUM CHLORIDE 20 MEQ/15ML (10%) PO SOLN
20 MEQ/15ML (10%) | ORAL | Status: DC | PRN
Start: 2014-07-08 — End: 2014-07-15

## 2014-07-08 MED ORDER — MECLIZINE HCL 12.5 MG PO TABS
12.5 MG | Freq: Once | ORAL | Status: AC
Start: 2014-07-08 — End: 2014-07-07
  Administered 2014-07-08: 02:00:00 25 mg via ORAL

## 2014-07-08 MED ORDER — ENOXAPARIN SODIUM 40 MG/0.4ML SC SOLN
40 MG/0.4ML | Freq: Every day | SUBCUTANEOUS | Status: DC
Start: 2014-07-08 — End: 2014-07-09

## 2014-07-08 MED ORDER — FUROSEMIDE 40 MG PO TABS
40 MG | Freq: Two times a day (BID) | ORAL | Status: DC
Start: 2014-07-08 — End: 2014-07-15
  Administered 2014-07-08 (×2): 40 mg via ORAL
  Administered 2014-07-09 – 2014-07-15 (×13): 80 mg via ORAL

## 2014-07-08 MED ORDER — LACTULOSE 10 GM/15ML PO SOLN
10 GM/15ML | Freq: Two times a day (BID) | ORAL | Status: DC
Start: 2014-07-08 — End: 2014-07-08
  Administered 2014-07-08: 10:00:00 20 g via ORAL

## 2014-07-08 MED ORDER — LISINOPRIL 5 MG PO TABS
5 MG | Freq: Every evening | ORAL | Status: DC
Start: 2014-07-08 — End: 2014-07-15
  Administered 2014-07-09 – 2014-07-13 (×3): 5 mg via ORAL

## 2014-07-08 MED ORDER — DICLOFENAC SODIUM 0.1 % OP SOLN
0.1 % | Freq: Every day | OPHTHALMIC | Status: DC
Start: 2014-07-08 — End: 2014-07-15
  Administered 2014-07-08 – 2014-07-15 (×8): 1 [drp] via OPHTHALMIC

## 2014-07-08 MED ORDER — MAGNESIUM OXIDE 400 (241.3 MG) MG PO TABS
400 (241.3 Mg) MG | Freq: Two times a day (BID) | ORAL | Status: DC
Start: 2014-07-08 — End: 2014-07-15
  Administered 2014-07-08 – 2014-07-15 (×16): 400 mg via ORAL

## 2014-07-08 MED ORDER — SODIUM CHLORIDE 0.9 % IV SOLN
0.9 % | INTRAVENOUS | Status: DC
Start: 2014-07-08 — End: 2014-07-11
  Administered 2014-07-08 – 2014-07-11 (×5): via INTRAVENOUS

## 2014-07-08 MED ORDER — MORPHINE SULFATE (PF) 4 MG/ML IV SOLN
4 MG/ML | INTRAVENOUS | Status: DC | PRN
Start: 2014-07-08 — End: 2014-07-15
  Administered 2014-07-09: 13:00:00 4 mg via INTRAVENOUS

## 2014-07-08 MED ORDER — LISINOPRIL 5 MG PO TABS
5 MG | Freq: Every evening | ORAL | Status: DC
Start: 2014-07-08 — End: 2014-07-08
  Administered 2014-07-08: 06:00:00 5 mg via ORAL

## 2014-07-08 MED ORDER — CITALOPRAM HYDROBROMIDE 20 MG PO TABS
20 MG | Freq: Every day | ORAL | Status: DC
Start: 2014-07-08 — End: 2014-07-15
  Administered 2014-07-08 – 2014-07-15 (×8): 10 mg via ORAL

## 2014-07-08 MED FILL — LACTULOSE 10 GM/15ML PO SOLN: 10 GM/15ML | ORAL | Qty: 30

## 2014-07-08 MED FILL — XIFAXAN 550 MG PO TABS: 550 MG | ORAL | Qty: 1

## 2014-07-08 MED FILL — LISINOPRIL 5 MG PO TABS: 5 MG | ORAL | Qty: 1

## 2014-07-08 MED FILL — FAMOTIDINE 20 MG PO TABS: 20 MG | ORAL | Qty: 1

## 2014-07-08 MED FILL — MAGNESIUM OXIDE 400 (241.3 MG) MG PO TABS: 400 (241.3 Mg) MG | ORAL | Qty: 1

## 2014-07-08 MED FILL — EPLERENONE 25 MG PO TABS: 25 MG | ORAL | Qty: 2

## 2014-07-08 MED FILL — CARVEDILOL 6.25 MG PO TABS: 6.25 MG | ORAL | Qty: 1

## 2014-07-08 MED FILL — FUROSEMIDE 40 MG PO TABS: 40 MG | ORAL | Qty: 1

## 2014-07-08 MED FILL — MECLIZINE HCL 12.5 MG PO TABS: 12.5 MG | ORAL | Qty: 2

## 2014-07-08 MED FILL — SODIUM CHLORIDE 0.9 % IV SOLN: 0.9 % | INTRAVENOUS | Qty: 1000

## 2014-07-08 MED FILL — CITALOPRAM HYDROBROMIDE 20 MG PO TABS: 20 MG | ORAL | Qty: 1

## 2014-07-08 MED FILL — DICLOFENAC SODIUM 0.1 % OP SOLN: 0.1 % | OPHTHALMIC | Qty: 2.5

## 2014-07-08 MED FILL — FUROSEMIDE 40 MG PO TABS: 40 MG | ORAL | Qty: 2

## 2014-07-08 MED FILL — PROAIR HFA 108 (90 BASE) MCG/ACT IN AERS: 108 (90 Base) MCG/ACT | RESPIRATORY_TRACT | Qty: 8.5

## 2014-07-08 NOTE — Progress Notes (Signed)
RESPIRATORY THERAPY ASSESSMENT    Andrew [name]   [id number]  03-11-1940      Recent Surgical Procedures:  none  Pulmonary History:  none  Home Respiratory Therapy:  none  Home Oxygen Therapy:  none  Current Respiratory Therapy:  Alb mdi q6prn   Filed Vitals:    07/08/14 0113   BP: 111/69   Pulse: 74   Temp: 98.3 ??F (36.8 ??C)   Resp: 16   SpO2: 93%       MPVC: uta mL  Actual VC: uta mL  Sputum:  ,  ,      ASSESSMENT OF RESPIRATORY SEVERITY INDEX (RSI)    Patients with orders for inhalation medications, oxygen, or any therapeutic treatment modality will be placed on Respiratory Protocol.  They will be assessed with the first treatment and at least every 72 hours thereafter.  The following severity scale will be used to determine frequency of treatment intervention.    Respiratory History: Smoking History Less than 1ppd or less than 15 pack year = 1    Recent Surgical History: None = 0    Level of Consciousness: Alert, Oriented, and Cooperative = 0    Level of Activity: Walking with assistance = 1    Respiratory Pattern: Regular Pattern; RR 8-20 = 0    Breath Sounds: Clear = 0    Cough: Strong, spontaneous, non-productive = 0    SPO2 (COPD values may differ): Greater than or equal to 92% on room air = 0    Peak Flow (asthma only): not applicable = 0    RSI: 0-5 = See once and convert to home regimen or discontinue  PLAN    Goal    Patient educated on: Disease Process     Comment / Outcomes: pt is not in for breathing difficulties    Plan of Care: keep txs prn for wheezing    Is patient being placed on Home Treatment Regimen? No      Respiratory Protocol Guidelines    1. Assessment and treatment by Respiratory Therapy will be initiated for medication and therapeutic interventions upon initiation of aerosolized medication.  2. Physician will be contacted for respiratory rate (RR) greater than 35 breaths per minute. Therapy will be held for heart rate (HR) greater than 140 beats per minute, pending direction from  physician.  3. Bronchodilators will be administered via Metered Dose Inhaler (MDI) or Hydrofluoroalkane (HFA) with spacer when the following criteria are met:  a. Alert and cooperative     b. HR < 140 bpm  c. RR < 30 bpm                d. Can demonstrate a 2-3 second inspiratory hold  4. Bronchodilators will be administered via Hand Held Nebulizer [facility]) to patients when ANY of the following criteria are met  a. Incogizant or uncooperative          b. Patients treated with HHN at Home        c. Unable to demonstrate proper MDI or HFA technique     d. RR > 30 bpm   5. Bronchodilators will be delivered via Metered Dose Inhaler (MDI) or Hydrofluoroalkane (HFA) with spacer to intubated patients on mechanical ventilation.  6. Inhalation medication orders will be delivered and/or substituted as outlined below.    Aerosolized Medications Ordering and Administration Guidelines:    1. All Medications will be ordered by a physician, and their frequency and/or modality will be adjusted as  defined by the patients Respiratory Severity Index (RSI) score.  2. If the patient does not have documented COPD, consider discontinuing anticholinergics when RSI is less than 9.  3. If the bronchospasm worsens (increased RSI), then the bronchodilator frequency can be increased to a maximum of every 4 hours.  If greater than every 4 hours is required, the physician will be contacted.  4. If the bronchospasm improves, the frequency of the bronchodilator can be decreased, based on the patient's RSI, but not less than home treatment regimen frequency.  5. Bronchodilator(s) will be discontinued if patient has a RSI less than 9 and has received no scheduled or as needed treatment for 72  Hrs.    Patients Ordered on a Mucolytic Agent:    1. Must always be administered with a bronchodilator.    2. Discontinue if patient experiences worsened bronchospasm, or secretions have lessened to the point that the patient is able to clear them with a  cough.    Anti-inflammatory and Combination Medications:    1. If the patient lacks prior history of lung disease, is not using inhaled anti-inflammatory medication at home, and lacks wheezing by examination or by history for at least 24 hours, contact physician for possible discontinuation.      Pharmacy formulary interchanges for Respiratory medications can also be found by using the Smart Phrase .[email or web address] in any EPIC comment field.

## 2014-07-08 NOTE — Progress Notes (Signed)
Bedside rounding with Darden Dates, RN.  Pt up in chair. Family at bedside. Pt denies needs at this time. Chair alarm set. Call light in hand. Electronically signed by Eloy End, RN on 07/08/2014 at 7:27 PM

## 2014-07-08 NOTE — Progress Notes (Signed)
Bedside rounding completed with Darden Dates, RN.  Pt denies needs at this time.  Call light in hand and pt verbalizes correct use. Bed alarm set. Pt's wife at bedside. Denies needs at this time. Will continue to monitor. Electronically signed by Eloy End, RN on 07/08/2014 at 7:26 AM

## 2014-07-08 NOTE — Progress Notes (Signed)
Pt's wife states he only takes 40mg  Lasix BID at home.  States teh doctor just changed dosing and the pt should only take 1 tablet.  Only 1 tablet administered. Electronically signed by Eloy End, RN on 07/08/2014 at 5:52 PM

## 2014-07-08 NOTE — Progress Notes (Signed)
Occupational Therapy   Occupational Therapy Initial Assessment  Date: 07/08/2014   Patient Name: Andrew [name]  MRN: [id number]     DOB: 23-Aug-1940    Treatment Diagnosis: Decreased functional mobility, ADL/IADL status, balance related to dizzy      Restrictions  Restrictions/Precautions  Restrictions/Precautions: Fall Risk (high fall risk)  Required Braces or Orthoses?: No  Position Activity Restriction  Other position/activity restrictions: up as tolerated; pt's hemoglobin 7.7 -- obtained RN, Cari, approval prior to approach  Vision/Hearing  Vision: Within Functional Limits (bilat eye sx for glaucoma)  Hearing: Within functional limits     Subjective   General  Chart Reviewed: Yes  Additional Pertinent Hx: HTN, BPH, cirrhosis (non-alcoholic), compression fx, CAD, CHF, DVT, cancer, leukopenia  Family / Caregiver Present: Yes (wife and other visitor)  Diagnosis: dizzy  Subjective  Subjective: Pt lying supine in bed upon arrival, wife assisting with feeding pt. Pt agreeable to evaluation.  Pain Assessment  Patient Currently in Pain: Denies  Pain Assessment: 0-10  Pain Level: 0  Response to Pain Intervention: Patient Satisfied     Social/Functional History  Social/Functional History  Lives With: Spouse  Type of Home: House  Home Layout: Two level, Bed/Bath upstairs, 1/2 bath on main level (HR on L side)  Home Access: Stairs to enter without rails  Entrance Stairs - Number of Steps: 1 STE  Bathroom Shower/Tub: Tub/Shower unit (remodeling to walk-in shower)  Bathroom Toilet: Soil scientist: Hand-held shower  Home Equipment:  (no DME)  ADL Assistance: Independent  Homemaking Assistance:  (wife completes cooking, Education administrator, Medical sales representative)  Armed forces logistics/support/administrative officer: No  Ambulation Assistance: Independent  Transfer Assistance: Independent  Active Driver: Yes  Occupation: Retired  Type of occupation: worked at [employer]  Additional Comments: pt's wife reports some near falls. Pt has been needing more  assistance with feeding and other ADLs for past 4-5 days. Wife works part-time (works close by)     IT trainer Status: Within Functional Limits  Objective   Observation/Palpation  Posture: Good  Balance  Sitting Balance: Stand by assistance  Standing Balance: Dependent/Total (min A of 2 for functional ambulation without AD)  Standing Balance  Time: x1-2 min; x4-5 min  Activity: functional ambulation to/from bathroom; in stance for LB dressing and clothing management, grooming  Sit to stand: Contact guard assistance  Stand to sit: Contact guard assistance  Comment: with RW to bathroom; without RW from bathroom with min A of 2  Toilet Transfers  Toilet - Technique: Ambulating  Equipment Used: Standard toilet (R grab bar)  Transfer Level: Contact guard assistance  ADL  Grooming: Contact guard assistance (in stance to wash hands)  LE Dressing: Increased time to complete;Stand by assistance (SBA as pt doffed depends, threaded depends, doffed/donned socks seated on toilet)  Toileting: Contact guard assistance;Increased time to complete  Transfer: Contact guard assistance  Additional Comments: Supine>sit EOB, SBA. Pt with no c/o dizziness once seated EOB. Sit>stand from EOB, CGA. Functional ambulation to bathroom with RW, min A. Pt completed toileting, LB dressing, and grooming tasks (see above for assist levels). Stand-step transfer from toilet to sink with RW, min A. Pt returned to room without AD, min A of 2. Stand>sit into chair, CGA. Pt left sitting up in chair at of evaluation, all needs in reach.  Tone RUE  RUE Tone: Normotonic  Tone LUE  LUE Tone: Normotonic  Coordination  Movements Are Fluid And Coordinated: Yes  Functional Activity Tolerance  Functional  Activity Tolerance: Tolerates 30+ min exercise without fatigue  Bed mobility  Supine to Sit: Stand by assistance  Transfers  Stand Step Transfers: Dependent/Total (min A of 2 without AD; min A of 1 with RW)  Sit to stand: Contact  guard assistance  Stand to sit: Contact guard assistance  Vision - Basic Assessment  Prior Vision: No visual deficits  Visual History: Surgical intervention  Patient Visual Report: No visual complaint reported.  Cognition  Overall Cognitive Status: WFL  Arousal/Alertness: Delayed responses to stimuli  Perception  Overall Perceptual Status: WFL  Sensation  Overall Sensation Status: WFL     ROM  ROM: WFL  LUE Strength  Gross LUE Strength: WFL  RUE Strength  Gross RUE Strength: WFL                  Assessment   Assessment: Decreased functional mobility ;Decreased ADL status;Decreased high-level IADLs;Decreased balance  Comments: Pt is not at baseline level of function and will benefit from skilled OT in order to address the above limitations.  Treatment Diagnosis: Decreased functional mobility, ADL/IADL status, balance related to dizzy  Rehab Potential: Good  Recommendations: Inpatient Rehab;Patient would benefit from continued therapy after discharge  Goal Formulation: Patient;Family  Requires OT Follow Up: Yes  Total Treatment Time: 46  Timed Code Treatment Minutes: 31 Minutes  Response to Treatment  Response to Treatment: Patient Tolerated treatment well  Safety Devices  Safety Devices in place: Yes  Type of devices: All fall risk precautions in place;Left in chair;Nurse notified;Call light within reach;Chair alarm in place;Patient at risk for falls  OT D/C Equipment  Equipment Needed: Yes  Shower Chair: to increase safety and independence in bathing        Discharge Recommendations:  Inpatient Rehab, Patient would benefit from continued therapy after discharge     Plan   Plan  Times per week: 2-5x  Times per day: Daily  Patient Education: Role of OT, OT evaluation, safe functional mobility, POC, d/c recommendations    G-Code  OT G-codes  Functional Limitation: Self care  Self Care Current Status (O7564): At least 20 percent but less than 40 percent impaired, limited or restricted  Self Care Goal Status (P3295): At  least 1 percent but less than 20 percent impaired, limited or restricted    Goals  Short term goals  Time Frame for Short term goals: Discharge  Short term goal 1: Pt will be supervision for all UB ADLs.  Short term goal 2: Pt will be supervision for all LB ADLs.  Short term goal 3: Pt will be SBA for functional mobility with AD as needed.  Short term goal 4: Pt will be supervision for functional transfers to/from all ADL surfaces.  Long term goals  Time Frame for Long term goals : STG=LTG       Kristine Linea, OT  License and Documentation Cosign  Therapy License Number: Kristine Linea, [place], OTR/L [place]

## 2014-07-08 NOTE — Telephone Encounter (Signed)
Noted.

## 2014-07-08 NOTE — Other (Addendum)
Patient Acct Nbr:  [id number]  Primary AUTH/CERT:    [place] Name:   Andrew [name] HMO/GE/NEWHEALTH/PREFE/POS  Primary Insurance Plan Name:  [employer]  Primary Insurance Group Number:  [id number]  Primary Insurance Plan Type: N  Primary Insurance Policy Number:  [id number]

## 2014-07-08 NOTE — Progress Notes (Signed)
Pt given lactulose early due to high ammonia levels. This RN aware of panic levels. Pt in neutropenic precautions. Will continue to monitor.

## 2014-07-08 NOTE — Progress Notes (Signed)
Pt up to bsc with 1 person assist.  Assisted pt to chair. Pt set up with dinner. Pt denies needs at this time.  All needs within reach.  Call light in hand. Chair alarm set. Will continue to monitor. Electronically signed by Eloy End, RN on 07/08/2014 at 7:02 PM

## 2014-07-08 NOTE — Progress Notes (Signed)
Vital signs obtained at this time and WNL. Pt resting in bed with eyes closed. Respirations even and unlabored.  Wife at bedside. Denies needs at this time. Call light in hand. Bed alarm set. Will continue to monitor. Electronically signed by Eloy End, RN on 07/08/2014 at 4:21 PM

## 2014-07-08 NOTE — Progress Notes (Addendum)
Physical Therapy  Facility/Department: [facility]  Initial Assessment    NAME: Andrew [name]  DOB: 05-07-1940  MRN: [id number]    Date of Service: 07/08/2014    Patient Diagnosis(es):   Patient Active Problem List    Diagnosis Date Noted   ??? Erectile dysfunction 04/30/2009     Priority: Low   ??? 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 Ataxia 07/07/2014   ??? Pancytopenia ([place]) 07/07/2014   ??? Benign essential HTN 07/03/2014   ??? Bronchiectasis without complication ([place]) [id number]   ??? 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 Anemia 04/02/2014   ??? Hemispheric carotid artery syndrome    ??? CAD in native artery    ??? Hyperlipidemia    ??? Abnormal EKG 04/01/2014   ??? Visual changes 04/01/2014   ??? Lumbar compression fracture ([place]) 01/14/2014   ??? Chronic low back pain 01/14/2014   ??? Osteopenia 01/14/2014   ??? Coagulopathy ([place]) 01/14/2014   ??? 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 Chronic systolic CHF (congestive heart failure) ([place]) 10/18/2013   ??? Mild CAD    ??? Cirrhosis, nonalcoholic ([place])    ??? 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 BPH (benign prostatic hyperplasia) 08/01/2009   ??? Thrombocytopenia 04/30/2009   ??? Edema 04/30/2009       Past Medical History   Diagnosis Date   ??? Hypertension    ??? Kidney stones    ??? Alpha 1 antitrypsin deficiency    ??? Thrombocytopenia ([place]) 04/30/2009   ??? BPH (benign prostatic hyperplasia)    ??? 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 Chronic systolic CHF (congestive heart failure) ([place]) 10/18/2013   ??? CAD (coronary artery disease)      Non-obstructive   ??? Compression fracture of lumbar vertebra ([facility])      multiple, lumbar   ??? Leukopenia    ??? Anemia    ??? Pneumonia    ??? DVT (deep venous thrombosis) ([place])    ??? Cancer ([place])    ??? Hyperlipidemia      Past Surgical History   Procedure Laterality Date   ??? Av fistula repair  1970's   ??? Colonoscopy  4/12     5y   ??? Cardiac catheterization     ??? Eye surgery  04/12/2014      Laser Eye Surgery for glaucoma   ??? Cosmetic surgery  04/2013     eye lids     Restrictions  Restrictions/Precautions  Restrictions/Precautions: Fall Risk (high fall risk Simultaneous filing. User may not have seen previous data.)  Required Braces or Orthoses?: No  Position Activity Restriction  Other position/activity restrictions: up as tolerated; pt's hemoglobin 7.7 -- obtained RN, Cari, approval prior to approach  Vision/Hearing  Vision: Within Functional Limits (B eye surgery for glaucoma )  Hearing: Within functional limits     Subjective  General  Chart Reviewed: Yes  Family?Caregiver present: Yes (wife, Sonia Baller)  Diagnosis: dizzy (Hepatic encephalopathy, nonalcoholic cirrhosis of liver)   Follows Commands: Within Functional Limits  Other (Comment): slow to respond, requires increased time to complete activities   General Comment  Comments: pt presented semifowlers in bed with wife feeding pt breakfast  Subjective  Subjective: pt pleasant and agreeable to therapy   Pain Screening  Patient Currently in Pain: Denies   Vital Signs  Patient Currently in Pain: Denies  Orientation  Orientation  Overall Orientation  Status: Within Normal Limits  Social/Functional History  Social/Functional History  Lives With: Spouse  Type of Home: House  Home Layout: Two level, Bed/Bath upstairs, 1/2 bath on main level (HR on L side)  Home Access: Stairs to enter without rails  Entrance Stairs - Number of Steps: 1 STE  Bathroom Shower/Tub: Tub/Shower unit (remodeling to walk-in shower)  Bathroom Toilet: Standard  Bathroom Equipment: Hand-held shower  Home Equipment:  (no DME)  ADL Assistance: Independent  Homemaking Assistance:  (wife completes cooking, Education administrator, Medical sales representative)  Armed forces logistics/support/administrative officer: No  Ambulation Assistance: Independent  Transfer Assistance: Independent  Active Driver: Yes  Occupation: Retired  Type of occupation: worked at [employer]  Additional Comments: pt's wife reports some near falls. Pt has been  needing more assistance with feeding and other ADLs for past 4-5 days. Wife works part-time (works close by)  Objective  RLE (degrees)  RLE ROM: Active;WFL  ROM LLE (degrees)  LLE ROM: Active;WFL  Strength RLE  Strength RLE: WFL  Comment: hip flexion, knee flexion/extension, anke DF 4/5  Strength LLE  Strength LLE: WFL  Comment: hip flexion, knee flexion/extension, anke DF 4/5  Tone RLE  RLE Tone: Normotonic  Tone LLE  LLE Tone: Normotonic  Motor Control  Gross Motor?: WFL  Comments: heel to shin assessment, alternating toe taps WFL Bilateral LE, no tremor/significant ataxia noted   Sensation  Overall Sensation Status: WNL  Bed Mobility  Supine to Sit: Stand by assistance (HOB flat )  Transfers  Sit to Stand: Contact guard assistance  Stand to sit: Contact guard assistance  Ambulation  Ambulation?: Yes  Ambulation 1  Surface: level tile  Device: Rolling Walker  Assistance: Minimal assistance  Quality of Gait: flexed posture, shuffled steps, bilateral ER (RLE>LLE)  Distance: 8'   Comments: verbal cues to keep RW close to BOS, MIN assist for RW management   Ambulation 2  Surface - 2: level tile  Device 2: No device (bilateral HHA )  Assistance 2: Dependent/Total (MIN x2 with bilateral HHA )  Quality of Gait 2: improved step length and posture, increased WB through bilateral UE, bilateral ER (RLE>LLE)   Distance: 8'  Comments: no LOB, verbal cues for sequencing   Stairs/Curb  Stairs?: No     Balance  Posture: Fair  Sitting - Static: Fair  Sitting - Dynamic: Fair  Standing - Static: Fair (with AD)  Standing - Dynamic: Fair (with AD)  Comments: pt able to sit to preform toileting, reach outside BOS to don clean brief without LOB with SBA, pt CGA for standing tasks of donning clean brief/hand hygeine at sink without LOB         Assessment   Assessment: Decreased functional mobility ;Decreased strength;Decreased endurance;Decreased balance  Assessment: Pt demonstrates deficits noted related to dx. Pt is currently not at  [facility] and would benefit from further PT to improve mobility and decrease risk of falling.   Comments: PT assessment of visual scanning/smooth pursuit negative for nastagmus, denied dizziness with supine>sit position changes and horizontal head turns   Treatment Diagnosis: decreased strength, decreased activity tolerance, impaired balance, impaired gait related to dx  Prognosis: Good  Requires PT Follow Up: Yes  Total Treatment Time: 47  Timed Code Treatment Minutes: 32 Minutes  Activity Tolerance  Activity Tolerance: Patient Tolerated treatment well  PT D/C Equipment  Equipment Needed: Yes (will need RW for ambulation if d/c home )  Walker: Rolling     Discharge Recommendations:  IP Rehab  Plan   Plan  Times per week: 2-5x  Times per day: Daily  Current Treatment Recommendations: Strengthening;Balance Training;Functional Mobility Training;Endurance Training;Gait Training;Neuromuscular Re-education;Pain Management;Safety Education & Training;Patient/Caregiver Education & Training;Equipment Evaluation, Education, & procurement  Patient Education: role of PT and POC, discharge recommendations, safety with transfers  Safety Devices  Safety Devices in place: Yes  Type of devices: All fall risk precautions in place;Call light within reach;Chair alarm in place;Gait belt;Patient at risk for falls;Left in chair;Nurse notified (Cari )  Restraints  Initially in place: No    G-Code  PT G-Codes  Functional Assessment Tool Used: PT assessment   Functional Limitation: Mobility: Walking and moving around  Mobility: Walking and Moving Around Current Status [id number]): At least 20 percent but less than 40 percent impaired, limited or restricted  Mobility: Walking and Moving Around Goal Status [phone]): At least 1 percent but less than 20 percent impaired, limited or restricted    Goals  Short term goals  Time Frame for Short term goals: prior to discharge  Short term goal 1: pt will be SUPV for bed mobility  Short term goal 2: pt will  be SBA for transfers  Short term goal 3: pt will be able to ambulate 20' with or without AAD with CGA   Short term goal 4: pt will be able to ascend/descend 4 steps with HR with [place], PT  License and [place] Number: Crisoforo Oxford PT DPT [phone]

## 2014-07-08 NOTE — Telephone Encounter (Signed)
Pts wife stopped by the office this morning to let [employer] know her husband is in the hospital in room 560.  He was to have an appt today @ 8:15.  Thanks [employer]

## 2014-07-08 NOTE — Progress Notes (Signed)
Pt resting comfortably in bed. VSS. Family at bedside. Will continue to monitor.

## 2014-07-08 NOTE — Progress Notes (Signed)
Pt up in chair, family at bedside. Pt took all scheduled medications whole with water. 75% of dinner tray eaten. Pt and family aware of urine specimen that needs to be collected from pt- urinal provided. VSS. Shift assessment completed. Will continue to monitor.

## 2014-07-08 NOTE — Progress Notes (Signed)
Pt up to bsc with 1 person assist.  + large BM. Unable to obtain urine specimen d/t contamination. Assisted pt back to bed. Pt denies needs at this time.  All needs within reach.  Call light in hand. Bed alarm set. Will continue to monitor. Electronically signed by Eloy End, RN on 07/08/2014 at 1:36 PM

## 2014-07-08 NOTE — Progress Notes (Signed)
Progress Note - Dr. Tasia Catchings - Internal Medicine  PCP: Alecia Lemming, DO [place] / [place] [address] [phone]    Hospital Day: 0  Code Status: Full Code  Current Diet: DIET GENERAL;        CC: follow up on medical issues    Subjective:   Andrew [name] is a 74 y.o. male.    He denies problems    Pt still confused  Discussed case with wife  Appreciate heme eval    He denies chest pain, denies shortness of breath, denies nausea,  denies emesis.    10 system Review of Systems is reviewed with patient, and pertinent positives are listed here: None . Otherwise, Review of systems is negative.     I have reviewed the patient's medical and social history in detail and updated the computerized patient record.  To recap: He  has a past medical history of Hypertension; Kidney stones; Alpha 1 antitrypsin deficiency; Thrombocytopenia ([place]); BPH (benign prostatic hyperplasia); Cirrhosis, nonalcoholic ([place]); Chronic systolic CHF (congestive heart failure) ([place]); CAD (coronary artery disease); Compression fracture of lumbar vertebra ([place]); Leukopenia; Anemia; Pneumonia; DVT (deep venous thrombosis) ([place]); Cancer ([place]); and Hyperlipidemia.. He  has past surgical history that includes AV fistula repair (1970's); Colonoscopy (4/12); Cardiac catheterization; eye surgery (04/12/2014); and Cosmetic surgery (04/2013).[name] He  reports that he has never smoked. He has never used smokeless tobacco. He reports that he does not drink alcohol or use illicit drugs..        Active Hospital Problems    Diagnosis Date Noted   ??? 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 Ataxia [R27.0] 07/07/2014   ??? Pancytopenia ([place]) [D61.818] 07/07/2014   ??? Benign essential HTN [I10] 07/03/2014   ??? Hyperlipidemia [E78.5]    ??? Cirrhosis, nonalcoholic ([facility]) [Z60.10]        Current facility-administered medications: rifaximin (XIFAXAN) tablet 550 mg, 550 mg, Oral, BID  lactulose (CHRONULAC) 10 GM/15ML solution 20 g, 20 g, Oral, BID  magnesium oxide  (MAG-OX) tablet 400 mg, 400 mg, Oral, BID  albuterol sulfate HFA 108 (90 BASE) MCG/ACT inhaler 2 puff, 2 puff, Inhalation, Q6H PRN  lisinopril (PRINIVIL;ZESTRIL) tablet 5 mg, 5 mg, Oral, Nightly  carvedilol (COREG) tablet 6.25 mg, 6.25 mg, Oral, BID WC  citalopram (CELEXA) tablet 10 mg, 10 mg, Oral, Daily  furosemide (LASIX) tablet 80 mg, 80 mg, Oral, BID  potassium chloride SA (K-DUR;KLOR-CON M) tablet 40 mEq, 40 mEq, Oral, PRN **OR** potassium chloride 20 MEQ/15ML (10%) oral solution 40 mEq, 40 mEq, Oral, PRN **OR** potassium chloride 10 mEq/100 mL IVPB (Peripheral Line), 10 mEq, Intravenous, PRN  0.9 % sodium chloride infusion, , Intravenous, Continuous  sodium chloride flush 0.9 % injection 10 mL, 10 mL, Intravenous, 2 times per day  sodium chloride flush 0.9 % injection 10 mL, 10 mL, Intravenous, PRN  acetaminophen (TYLENOL) tablet 650 mg, 650 mg, Oral, Q4H PRN  HYDROcodone-acetaminophen (NORCO) 5-325 MG per tablet 1 tablet, 1 tablet, Oral, Q4H PRN **OR** HYDROcodone-acetaminophen (NORCO) 5-325 MG per tablet 2 tablet, 2 tablet, Oral, Q4H PRN  morphine (PF) injection 2 mg, 2 mg, Intravenous, Q2H PRN **OR** morphine (PF) injection 4 mg, 4 mg, Intravenous, Q2H PRN  magnesium hydroxide (MILK OF MAGNESIA) 400 MG/5ML suspension 30 mL, 30 mL, Oral, Daily PRN  ondansetron (ZOFRAN) injection 4 mg, 4 mg, Intravenous, Q6H PRN  famotidine (PEPCID) tablet 20 mg, 20 mg, Oral, BID PRN  enoxaparin (LOVENOX) injection 40 mg, 40 mg, Subcutaneous, Daily  Objective:  BP 104/61 mmHg   Pulse 59   Temp(Src) 97.5 ??F (36.4 ??C) (Oral)   Resp 17   Ht 5\' 5"  (1.651 m)   Wt 172 lb (78.019 kg)   BMI 28.62 kg/m2   SpO2 94%     Patient Vitals for the past 24 hrs:   BP Temp Temp src Pulse Resp SpO2 Height Weight   07/08/14 0838 104/61 mmHg 97.5 ??F (36.4 ??C) Oral 59 17 94 % - -   07/08/14 0528 108/62 mmHg 98.6 ??F (37 ??C) - 72 16 94 % - -   07/08/14 0113 111/69 mmHg 98.3 ??F (36.8 ??C) Oral 74 16 93 % - -   07/08/14 0047 102/50 mmHg - - 65  17 93 % - -   07/08/14 0031 103/60 mmHg - - 71 19 - - -   07/07/14 2131 111/61 mmHg - - 61 19 95 % - -   07/07/14 1913 95/55 mmHg 98.7 ??F (37.1 ??C) Oral 68 15 94 % - -   07/07/14 1911 - - - - - - 5\' 5"  (1.651 m) 172 lb (78.019 kg)     Patient Vitals for the past 96 hrs (Last 3 readings):   Weight   07/07/14 1911 172 lb (78.019 kg)           Intake/Output Summary (Last 24 hours) at 07/08/14 0918  Last data filed at 07/08/14 0455   Gross per 24 hour   Intake    357 ml   Output      0 ml   Net    357 ml         Physical Exam:   S1, S2 normal, no murmur, rub or gallop, regular rate and rhythm  clear to auscultation bilaterally  abdomen is soft without significant tenderness, masses, organomegaly or guarding  extremities normal, atraumatic, no cyanosis or edema    Labs:  Lab Results   Component Value Date    WBC 2.0* 07/08/2014    HGB 7.7* 07/08/2014    HCT 23.0* 07/08/2014    PLT 47* 07/08/2014    CHOL 101 04/02/2014    TRIG 83 04/02/2014    HDL 22* 04/02/2014    ALT 20 07/07/2014    AST 43* 07/07/2014    NA 136 07/08/2014    K 4.0 07/08/2014    CL 102 07/08/2014    CREATININE 0.8 07/08/2014    BUN 20 07/08/2014    CO2 28 07/08/2014    TSH 2.15 08/15/2013    PSA 0.55 08/15/2013    INR 1.28* 07/07/2014    LABA1C 5.0 01/10/2014    LABMICR YES 07/07/2014     Lab Results   Component Value Date    TROPONINI <0.01 07/07/2014       Recent Imaging Results are Reviewed:  Ct Head Wo Contrast    07/07/2014   EXAMINATION: CT OF THE HEAD WITHOUT CONTRAST  07/07/2014 11:11 pm  TECHNIQUE: CT of the head was performed without the administration of intravenous contrast.  COMPARISON: 04/01/2014  HISTORY: ORDERING SYSTEM PROVIDED HISTORY: dizzy TECHNOLOGIST PROVIDED HISTORY: Ordering Physician Provided Reason for Exam: dizzy  FINDINGS: BRAIN/VENTRICLES: There is no acute intracranial hemorrhage, mass effect or midline shift.  No abnormal extra-axial fluid collection.  The gray-white differentiation is maintained without evidence of an acute  infarct.  There is no evidence of hydrocephalus.  ORBITS: The visualized portion of the orbits demonstrate no acute abnormality.  SINUSES: There is minimal mucosal thickening in  the left maxillary sinus. The remaining visualized paranasal sinuses are clear.  The mastoid air cells are unremarkable.  SOFT TISSUES/SKULL:  No acute abnormality of the visualized skull or soft tissues.     07/07/2014   IMPRESSION: No acute intracranial abnormality.     Ct Chest W Contrast    06/13/2014   EXAMINATION: CT OF THE CHEST WITH CONTRAST 06/13/2014 3:56 pm  TECHNIQUE: CT of the chest was performed with the administration of intravenous contrast. Multiplanar reformatted images are provided for review.  COMPARISON: None.  HISTORY: Bacterial pneumonia, unspecified  FINDINGS: Mediastinum:  *A lymph node is seen pretracheal upper mediastinum interposed between the proximal left common carotid artery and brachiocephalic artery, image 31. This measures approximately 12 mm.  Subcarinal density possible subcarinal adenopathy, difficult to separate from adjacent fluid. *While there is no significant cardiac enlargement, there is chamber enlargement, left ventricle and left atrium.  This could indicate mitral valve disease.  Coronary calcification is present. *There is mild coronary calcification. *Vasculature is unremarkable for age. Lungs/pleura:  *Large pleural effusion is seen on the right and small effusion on the left. Multifocal patchy areas of airspace disease are seen bilaterally both upper and lower lobes.  Right lower lobe has a somewhat bronchus centric distribution.  There is bronchiectasis bilaterally.  Focal area of consolidation is seen, right upper lobe anteriorly. *Airways are unremarkable. Soft Tissues/Bones: Bilateral gynecomastia is present.  Small nodular lesion projects from the inferior right thyroid gland.  Sagittal projection demonstrates multiple compression fractures L1 moderate to severe, L2 moderate, T9 moderate and  T10 minimal.  Bones are demineralized.  These fractures are faintly noted on prior lateral chest radiograph.  Upper Abdomen: The liver is grossly abnormal, with small overall size, increased density and extensive nodularity throughout the serosal surface. The spleen is mildly enlarged.  There is are extensive unenhanced splenic varices.  Multiple unenhanced gastric and paraesophageal varices are also noted.  Small right renal calculus present     06/13/2014   IMPRESSION: Multifocal airspace disease most likely pneumonia with large pleural effusion primarily on the right.  Chronic bronchiectasis.  Left ventricular and atrial chamber enlargement could indicate mitral valve disease.  Advanced cirrhotic morphology of the liver with portal hypertension and extensive varices, unenhanced on current CT.  Multiple compression fractures essentially unchanged.  Gynecomastia.  Multiple prior moderate compression fractures.     Xr Chest Portable    07/08/2014   EXAMINATION: SINGLE VIEW OF THE CHEST  07/07/2014 11:34 pm  COMPARISON: 06/03/2014  HISTORY: ORDERING SYSTEM PROVIDED HISTORY: other TECHNOLOGIST PROVIDED HISTORY: Ordering Physician Provided Reason for Exam: dizziness Acuity: Unknown Type of Exam: Unknown  FINDINGS: Cardiac leads project over the chest.  Diffuse interstitial opacity is demonstrated bilaterally.  Heterogeneous opacity is seen adjacent to the left heart border.  No evidence of pleural effusion.  Negative for pneumothorax. Cardiac and mediastinal silhouettes are similar to prior.  Old posterior left 7th rib fracture.     07/08/2014   IMPRESSION: Mild pulmonary edema.     Cta Pulmonary With Contrast    06/27/2014   EXAMINATION: CTA OF THE CHEST 06/27/2014 2:53 pm  TECHNIQUE: CTA of the chest was performed after the administration of intravenous contrast.  Multiplanar reformatted images are provided for review.  MIP images are provided for review.  Patient received 100 mL Isovue 370 intravenous contrast.  COMPARISON:  CT chest with contrast June 13, 2014.  HISTORY: ORDERING SYSTEM PROVIDED HISTORY: DVT (deep venous thrombosis), unspecified laterality [facility]) TECHNOLOGIST  PROVIDED HISTORY: Ordering Physician Provided Reason for Exam: DVT, SOB Injury/Trauma or Illness: Illness/Other Acuity: Acute Type of Exam: Initial Relevant Medica/Surgical History: CHF, cardiomyopathy  FINDINGS: Pulmonary Arteries: Pulmonary arteries are adequately opacified for evaluation.  No evidence of intraluminal filling defect to suggest pulmonary embolism.  Main pulmonary artery is normal in caliber.  Mediastinum: No evidence of mediastinal lymphadenopathy.  Heart is enlarged, similar in appearance.  The heart and pericardium demonstrate no acute abnormality.  There is no acute abnormality of the thoracic aorta. Nodularity of thyroid similar in appearance.  Lungs/pleura: The central airways are patent.  There is a moderate right-sided pleural effusion which appears slightly decreased compared to prior study.  There is mild patchy dependent opacities in the right upper lobe, similar in appearance.  There are dependent subpleural opacities within the lingula and left lower lobe.  There is mild opacity within the right middle lobe which is linear in appearance in appears slightly improved compared to prior study.  2 nodular appearing densities are again seen in the right lower lobe on image number 65, unchanged.  No pneumothorax.  Upper Abdomen: Cirrhotic liver with mildly distended gallbladder is similar in appearance to prior study.  Soft Tissues/Bones: Multiple compression fractures again seen, most pronounced at the thoracolumbar junction, similar in appearance.  Old right-sided rib fractures are also seen.  No acute bone or soft tissue abnormality.  Gynecomastia is again seen.     06/27/2014   IMPRESSION: No evidence of pulmonary embolism.  Moderate right pleural effusion, minimally decreased.  Persistent patchy areas of airspace opacity which may be  related to atelectasis versus pneumonia, slightly improved.  2 nodular appearing densities are seen within the right lower lobe which may be related to the airspace disease or represent lung nodules.  Follow-up recommended with dedicated chest CT in 3 months.  Remote compression fractures again seen.  Cirrhotic liver.     Vl Extremity Venous Bilateral    06/27/2014   Lower Extremities DVT Study   Demographics    Patient Name       Andrew [name]    Date of Study      06/27/2014        Gender              Male    Patient Number     [id number]        Date of Birth       [birth year]    Visit Number       [id number]       Age                 65 year(s)    Accession Number   [id number]         Room Number    Corporate ID       [id number]          Sonographer         Erasmo Leventhal,                                                           [place]    Ordering Physician Lacretia Leigh, MD  Interpreting        [facility] Vascular  Physician           Darlyn Chamber MD,                                                           [facility]   Procedure  Type of Study:    Veins:Lower Extremities DVT Study, VASC EXTREMITY VENOUS DUPLEX BILATERAL.    Vascular Sonographer Report   Additional Indications:Swelliing  Impressions Right Impression Acute partially occluding deep vein thrombosis involving the right set of gastroc veins zone 5-6. No other evidence of deep vein or superficial vein thrombosis involving the right lower extremity. Reflux noted in the right FV. Left Impression No evidence of deep vein or superficial vein thrombosis involving the left lower extremity.  Verbal to Dr. Jeb Levering. Patient released.  Conclusions    Summary    -Acute partially occluding deep vein thrombosis involving the right set of  gastroc veins zone 5-6.  -Reflux noted in the right FV.    Signature    ------------------------------------------------------------------  Electronically signed by Darlyn Chamber MD, [facility] (Interpreting   physician) on 06/27/2014 at 01:38 PM  ------------------------------------------------------------------   Patient Status:Routine. [place] - Vascular Lab. Technical Quality:Adequate visualization.  Velocities are measured in cm/s ; Diameters are measured in mm  Right Lower Extremities DVT Study Measurements Right 2D Measurements +------------------------+----------+---------------+----------+ !Location                !Visualized!Compressibility!Thrombosis! +------------------------+----------+---------------+----------+ !Sapheno Femoral Junction!Yes       !Yes            !None      ! +------------------------+----------+---------------+----------+ !GSV Thigh               !Yes       !Yes            !None      ! +------------------------+----------+---------------+----------+ !Common Femoral          !Yes       !Yes            !None      ! +------------------------+----------+---------------+----------+ !Prox Femoral            !Yes       !Yes            !None      ! +------------------------+----------+---------------+----------+ !Mid Femoral             !Yes       !Yes            !None      ! +------------------------+----------+---------------+----------+ !Dist Femoral            !Yes       !Yes            !None      ! +------------------------+----------+---------------+----------+ !Deep Femoral            !Yes       !Yes            !None      ! +------------------------+----------+---------------+----------+ !Popliteal               !Yes       !Yes            !None      ! +------------------------+----------+---------------+----------+ !GSV Below Knee          !  Yes       !Yes            !None      ! +------------------------+----------+---------------+----------+ !Gastroc                 !Yes       !Partial        !Acute     ! +------------------------+----------+---------------+----------+ !PTV                     !Yes       !Yes            !None      !  +------------------------+----------+---------------+----------+ !Peroneal                !Yes       !Yes            !None      ! +------------------------+----------+---------------+----------+ !SSV                     !Yes       !Yes            !None      ! +------------------------+----------+---------------+----------+  Right Doppler Measurements +------------------------+------+------+------------+ !Location                !Signal!Reflux!Reflux (sec)! +------------------------+------+------+------------+ !Sapheno Femoral Junction!Phasic!No    !            ! +------------------------+------+------+------------+ !Common Femoral          !Phasic!No    !            ! +------------------------+------+------+------------+ !Femoral                 !Phasic!Yes   !            ! +------------------------+------+------+------------+ !Deep Femoral            !Phasic!No    !            ! +------------------------+------+------+------------+ !Popliteal               !Phasic!No    !            ! +------------------------+------+------+------------+  Left Lower Extremities DVT Study Measurements Left 2D Measurements +------------------------+----------+---------------+----------+ !Location                !Visualized!Compressibility!Thrombosis! +------------------------+----------+---------------+----------+ !Sapheno Femoral Junction!Yes       !Yes            !None      ! +------------------------+----------+---------------+----------+ !GSV Thigh               !Yes       !Yes            !None      ! +------------------------+----------+---------------+----------+ !Common Femoral          !Yes       !Yes            !None      ! +------------------------+----------+---------------+----------+ !Prox Femoral            !Yes       !Yes            !None      ! +------------------------+----------+---------------+----------+ !Mid Femoral             !Yes       !Yes            !None      !  +------------------------+----------+---------------+----------+ !Dist Femoral            !  Yes       !Yes            !None      ! +------------------------+----------+---------------+----------+ !Deep Femoral            !Yes       !Yes            !None      ! +------------------------+----------+---------------+----------+ !Popliteal               !Yes       !Yes            !None      ! +------------------------+----------+---------------+----------+ !GSV Below Knee          !Yes       !Yes            !None      ! +------------------------+----------+---------------+----------+ !Gastroc                 !Yes       !Yes            !None      ! +------------------------+----------+---------------+----------+ !PTV                     !Yes       !Yes            !None      ! +------------------------+----------+---------------+----------+ !Peroneal                !Yes       !Yes            !None      ! +------------------------+----------+---------------+----------+ !SSV                     !Yes       !Yes            !None      ! +------------------------+----------+---------------+----------+  Left Doppler Measurements +------------------------+------+------+------------+ !Location                !Signal!Reflux!Reflux (sec)! +------------------------+------+------+------------+ !Sapheno Femoral Junction!Phasic!No    !            ! +------------------------+------+------+------------+ !Common Femoral          !Phasic!No    !            ! +------------------------+------+------+------------+ !Femoral                 !Phasic!No    !            ! +------------------------+------+------+------------+ !Deep Femoral            !Phasic!No    !            ! +------------------------+------+------+------------+ !Popliteal               !Phasic!No    !            ! +------------------------+------+------+------------+      Assessment and Plan:  Patient Active Hospital Problem List:   Hepatic encephalopathy ([place]) (07/07/2014)     Assessment: nh3=113    Plan: on xifaxan, lactulose   Cirrhosis, nonalcoholic ([place]) ()    Assessment: Stable    Plan: Continue present orders/plan   Hyperlipidemia ()    Assessment: Stable    Plan: Continue present orders/plan   Benign essential HTN (07/03/2014)    Assessment: Stable    Plan: Continue present orders/plan   Ataxia (07/07/2014)    Assessment: pt/ot eval pending  Plan: Continue present orders/plan   Pancytopenia ([place]) (07/07/2014)    Assessment: cause unclear    Plan: labs drawn per dr darnell. Poss bmbx              Deniece Portela  07/08/2014

## 2014-07-08 NOTE — ED Notes (Signed)
Report given to RN on 5T at this time. No further questions noted. Electronically signed by Daisy Lazar, RN on 07/08/2014 at 1:02 AM      Daisy Lazar, RN  07/08/14 [phone]

## 2014-07-08 NOTE — Progress Notes (Signed)
Pt provided with scheduled lactulose. Assisted back to bed from chair. Fall precautions in place.

## 2014-07-08 NOTE — Consults (Signed)
Gastroenterology Consult Note      Patient: Andrew [name]  DOB: September 19, 1940  Acct#:      Date:  07/08/2014     History of Present Illness  Patient is a 74 y.o. Caucasian male admitted with DIZZY who is seen in consult for hepatic encephalopathy. H/o CHF, cirrhosis.   He has cirrhosis due to NASH and is followed by Dr. Einar Grad. Per Dr. 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 No h/o ascites. No h/o esophageal variceal bleeding. He has small esophageal varices seen on EGD 01/2014. He has h/o hepatic encephalopathy and takes Xifaxan BID. Also takes lactulose mostly BID in order to have 3 BMs daily. He is on lasix and Inspra. He is no longer on aldactone due to gynecomastia. He had workup for anemia: EGD and colonoscopy in Nov 2015 for anemia. Small esophageal varices no gastric varies, non erosive gastritis. Normal colonoscopy.     He was admitted in January of this year for hypotension thought to be from volume depletion from his diuretics. He was off diuretics until late March. He was diagnosed with PNA in April.  He underwent CT chest on 4/21 which showed a right pleural effusion. He was seen by Dr. Eli Hose who thought it was related to cirrhosis and CHF.     His wife brought him to the ED yesterday for altered mental status for the past 4 days. He has been confused and unsteady on his feet. She believes he was taking his lactulose correctly - 2-3 times daily to have 3 BMs daily. Has been taking Xifaxan. No melena or hematochezia. No cough. No changes in bowel habits. He denies abdominal pain, nausea, vomiting. He had one BM here yesterday. No BMs today.   Celexa was started last Wednesday.         Past Medical History   Diagnosis Date   ??? Hypertension    ??? Kidney stones    ??? Alpha 1 antitrypsin deficiency    ??? Thrombocytopenia ([place]) 04/30/2009   ??? BPH (benign prostatic  hyperplasia)    ??? Cirrhosis, nonalcoholic ([place])      Due to Alpha-1 vs NASH   ??? Chronic systolic CHF (congestive heart failure) ([place]) 10/18/2013   ??? CAD (coronary artery disease)      Non-obstructive   ??? Compression fracture of lumbar vertebra (HCC)      multiple, lumbar   ??? Leukopenia    ??? Anemia    ??? Pneumonia    ??? DVT (deep venous thrombosis) ([place])    ??? Cancer ([place])    ??? Hyperlipidemia       Past Surgical History   Procedure Laterality Date   ??? Av fistula repair  1970's   ??? Colonoscopy  4/12     5y   ??? Cardiac catheterization     ??? Eye surgery  04/12/2014     Laser Eye Surgery for glaucoma   ??? Cosmetic surgery  04/2013     eye lids      Past Endoscopic History: see hpi    Admission Meds  No current facility-administered medications on file prior to encounter.     Current Outpatient Prescriptions on File Prior to Encounter   Medication Sig Dispense Refill   ??? citalopram (CELEXA) 10 MG tablet Take 1 tablet by mouth daily 30 tablet 3   ??? furosemide (LASIX) 40 MG  tablet 80 mg in AM, 40 mg PM 90 tablet 5   ??? carvedilol (COREG) 6.25 MG tablet 3.125 mg in AM and 6.25 mg in PM 60 tablet 3   ??? eplerenone (INSPRA) 50 MG tablet Take 1 tablet by mouth daily 30 tablet 3   ??? lisinopril (PRINIVIL;ZESTRIL) 5 MG tablet Take 1 tablet by mouth nightly 90 tablet 3   ??? magnesium oxide (MAG-OX) 400 (241.3 MG) MG TABS tablet TAKE ONE TABLET BY MOUTH TWICE DAILY 60 tablet 5   ??? traMADol (ULTRAM) 50 MG tablet Take 1 tablet by mouth every 12 hours as needed for Pain 60 tablet 0   ??? lactulose 20 GM/30ML SOLN Take 30 mLs by mouth 2 times daily 5400 mL 1   ??? Cholecalciferol (VITAMIN D3) 1000 UNITS CAPS Take by mouth daily      ??? MILK THISTLE PO Take  by mouth.     ??? rifaximin (XIFAXAN) 550 MG tablet Take 550 mg by mouth 2 times daily.       ??? Multiple Vitamins-Minerals (CENTRUM SILVER PO) Take  by mouth daily.         Allergies  Allergies   Allergen Reactions   ??? Spironolactone Other (See Comments)     gynecomastia      Social   History    Substance Use Topics   ??? Smoking status: Never Smoker    ??? Smokeless tobacco: Never Used      Comment: Passive smoke exposure: yes   ??? Alcohol Use: No        Family History   Problem Relation Age of Onset   ??? Diabetes Mother    ??? Diabetes Sister    ??? Diabetes Brother       Review of Systems  Constitutional: negative for fevers, chills, sweats    Ears, nose, mouth, throat, and face: negative for nasal congestion and sore throat   Respiratory: negative for cough and shortness of breath   Cardiovascular: negative for chest pain and dyspnea   Gastrointestinal: see hpi   Genitourinary:negative for dysuria and frequency   Integument/breast: negative for pruritus and rash   Hematologic/lymphatic: negative for bleeding and easy bruising   Musculoskeletal:negative for arthralgias and myalgias   Neurological: negative for dizziness and weakness        Physical Exam  Blood pressure 104/61, pulse 59, temperature 97.5 ??F (36.4 ??C), temperature source Oral, resp. rate 17, height 5\' 5"  (1.651 m), weight 172 lb (78.019 kg), SpO2 94 %.    General appearance: alert, cooperative, no distress, appears stated age  Eyes: Anicteric  Head: Normocephalic, without obvious abnormality  Lungs: crackles throughout, Normal Effort  Heart: regular rate and rhythm, normal S1 and S2, no murmurs or rubs  Abdomen: soft, non-tender. Bowel sounds normal. No masses,  no organomegaly.   Extremities: atraumatic, no cyanosis or edema  Skin: warm and dry, no jaundice  Neuro: Grossly intact, A&OX3 but slow to respond, + asterixis  Musculoskeletal: 5/5 grip strength BUE      Data Review:    Recent Labs      07/07/14   1924  07/08/14   0441  07/08/14   0917   WBC  1.9*  2.0*   --    HGB  8.4*  7.7*   --    HCT  24.7*  23.0*  24.7*   MCV  119.2*  120.4*   --    PLT  55*  47*   --  Recent Labs      07/07/14   1924  07/08/14   0441   NA  135*  136   K  4.7  4.0   CL  100  102   CO2  28  28   BUN  22*  20   CREATININE  0.8  0.8     Recent Labs      07/07/14    1924   AST  43*   ALT  20   BILIDIR  0.4*   BILITOT  1.2*   ALKPHOS  129     No results for input(s): LIPASE, AMYLASE in the last 72 hours.  Recent Labs      07/07/14   1924   PROTIME  14.7*   INR  1.28*     No results for input(s): PTT in the last 72 hours.  No results for input(s): OCCULTBLD in the last 72 hours.    Imaging Studies:                       CXR: 07/07/14  FINDINGS:   Cardiac leads project over the chest. Diffuse interstitial opacity is   demonstrated bilaterally. Heterogeneous opacity is seen adjacent to the left   heart border. No evidence of pleural effusion. Negative for pneumothorax.   Cardiac and mediastinal silhouettes are similar to prior. Old posterior left   7th rib fracture.       IMPRESSION:   Mild pulmonary edema.                    Assessment:     Principal Problem:    Hepatic encephalopathy (HCC)  Active Problems:    Cirrhosis, nonalcoholic (HCC)    Hyperlipidemia    Benign essential HTN    Ataxia    Pancytopenia (HCC)    Cirrhosis - secondary to NASH. Followed by Dr. Einar Grad. No signs of GI bleeding. No obvious ascites on exam.    Hepatic encephalopathy - No GI bleeding or AKI which can precipitate HE. No PNA on CXR, although CXR c/w pulmonary edema. UA trace leuk esterase and urine cx is pending.   Macrocytic anemia - no signs of GI bleed. Negative EGD and colonoscopy Nov 2015. No iron, folate, or B12 deficiency back in Nov. Heme following.   Pancytopenia - heme following.     Recommendations:   - continue Xifaxan BID  - increased lactulose to QID until having BMs  - check abdominal US to r/o ascites     Discussed with Dr. Cottie Banda, PA-C  [place]  I have personally performed a face to face diagnostic evaluation on this patient.  I have interviewed and examined the patient and I agree with the findings and recommended plan of care.  In summary, my findings and plan are the following: Elderly man with known cirrhosis complicated by  pancytopenia and hepatic encephalopathy(HE) now admitted with dizziness and lethargy c/w HE.  On exam there is slow response, but no confusion, (+)asterixis.  US shows no ascites and no liver masses, but cirrhotic morphology present.  Will increase his lactulose frequency to achieve catharsis/diarrhea, then titrate dose to 3 BM/day.  Will follow.    Norwood Levo, MD  [place] [facility] [place]  07/08/2014

## 2014-07-08 NOTE — Progress Notes (Signed)
Pt admitted to 5T in stable condition. Oriented to room and call light. Pt placed in fall precautions due to recent weakness. Wife states pt in unsafe to get up by himself. Pt provided with urinal, hygiene supplies, water pitcher. Warm blanket provided to pt and wife. Admission assessment completed. No skin integrity issues noted. Lung sounds are clear and diminished in bilateral lungs. Breaths are even and easy. Abdomen is distended, no pain noted on palpation. Wife staying the night at bedside. Will continue to monitor. All scheduled medications given.

## 2014-07-08 NOTE — ED Notes (Signed)
Attempted 22 Gauge IV X1 in his right forearm and was unsuccessful. Nurse notified of attempt.     Lucille Passy Lexxie Winberg  07/08/14 0013

## 2014-07-08 NOTE — Consults (Signed)
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  Pt did accidentally take all of his mornign and evening meds at once on Friday but wife notes confusion probably started around Andrew [name].  Wife denies any new medications, dietary changes, F/C/sick contacts, and reports compliance w/ lactulose.      ROS: UTO 2/2 AMS    Past Medical History:     has a past medical history of Hypertension; Kidney stones; Alpha 1 antitrypsin deficiency; Thrombocytopenia ([place]); BPH (benign prostatic hyperplasia); Cirrhosis, nonalcoholic ([place]); Chronic systolic CHF (congestive heart failure) ([place]); CAD (coronary artery disease); Compression fracture of lumbar vertebra ([place]); Leukopenia; Anemia; Pneumonia; DVT (deep venous thrombosis) ([place]); Cancer ([place]); and Hyperlipidemia.   Past Surgical History:    Past Surgical History   Procedure Laterality Date   ??? Av fistula repair  1970's   ??? Colonoscopy  4/12     5y   ??? Cardiac catheterization     ??? Eye surgery  04/12/2014     Laser Eye Surgery for glaucoma   ??? Cosmetic surgery  04/2013     eye lids      Current Medications:    Scheduled Meds:  ??? rifaximin  550 mg Oral BID   ??? lactulose  20 g Oral BID   ??? magnesium oxide  400 mg Oral BID   ??? lisinopril  5 mg Oral Nightly   ??? carvedilol  6.25 mg Oral BID WC   ??? citalopram  10 mg Oral Daily   ??? furosemide  80 mg Oral BID   ??? sodium chloride flush  10 mL Intravenous 2 times per day   ??? enoxaparin  40 mg Subcutaneous Daily     Continuous Infusions:  ??? sodium chloride 75 mL/hr at 07/08/14 0149     PRN Meds:.albuterol sulfate HFA, potassium chloride SA **OR** potassium chloride **OR** potassium chloride, sodium chloride flush, acetaminophen, HYDROcodone 5 mg - acetaminophen **OR**  HYDROcodone 5 mg - acetaminophen, morphine **OR** morphine, magnesium hydroxide, ondansetron, famotidine  Allergies:    Allergies   Allergen Reactions   ??? Spironolactone Other (See Comments)     gynecomastia      Social History:    reports that he has never smoked. He has never used smokeless tobacco. He reports that he does not drink alcohol or use illicit drugs.   Family History:     family history includes Diabetes in his brother, mother, and sister.        Physical Exam:  Vitals:  BP 108/62 mmHg   Pulse 72   Temp(Src) 98.6 ??F (37 ??C) (Oral)   Resp 16   Ht 5\' 5"  (1.651 m)   Wt 172 lb (78.019 kg)   BMI 28.62 kg/m2   SpO2 94%   General:  A/O x 1, NAD  HEENT: PERRL, no icterus  Lymph: No cervical, supraclavicular LAD  Resp: no resp distress, speaking in full sentences  Cardiovascular: RRR, no MRG  GI: Soft, no HSM  Skin: No jaundice, no petechiae  MSK: No pain, no muscle weakness  Extremities: No B/L edema, no atrophy  Neuro: CN 2-12 grossly  intact, + astrixis  Psych: ++ confusion, no agitation    Labs:    CBC:   Recent Labs      07/07/14   1924  07/08/14   0441   WBC  1.9*  2.0*   HGB  8.4*  7.7*   HCT  24.7*  23.0*   MCV  119.2*  120.4*   PLT  55*  47*     BMP:   Recent Labs      07/07/14   1924  07/08/14   0441   NA  135*  136   K  4.7  4.0   CL  100  102   CO2  28  28   BUN  22*  20   CREATININE  0.8  0.8     LIVER PROFILE:   Recent Labs      07/07/14   1924   AST  43*   ALT  20   BILIDIR  0.4*   BILITOT  1.2*   ALKPHOS  129     PT/INR:    Lab Results   Component Value Date    PROTIME 14.7 07/07/2014    PROTIME 16.1 04/02/2014    PROTIME 14.9 01/10/2014    INR 1.28 07/07/2014    INR 1.40 04/02/2014    INR 1.30 01/10/2014    INR 1.17 05/14/2010    INR 1.18 02/13/2010    INR 1.15 10/17/2009     PTT:    Lab Results   Component Value Date    APTT 29.7 10/17/2009       Investigations Reviewed:     CTPA (06/27/2014):  No evidence of pulmonary embolism.      Moderate right pleural effusion, minimally decreased.       Persistent patchy areas of airspace opacity which may be related to   atelectasis versus pneumonia, slightly improved.      2 nodular appearing densities are seen within the right lower lobe which may   be related to the airspace disease or represent lung nodules. Follow-up   recommended with dedicated chest CT in 3 months.      Remote compression fractures again seen.      Cirrhotic liver.         Assessment/Plan:   Macrocytic Pancytopenia - Checking B12, folate, MMA, retic, SPEP, sFLC.  If unrevealing will do BMBx tomorrow morning given long h/o TCP, may have evolving underlying BM disorder, however most likely counts are related to ESLD.     I have discussed the above stated plan with the patient and they verbalized understanding and agreed with the plan. Thank you for allowing [place] to participate in this patients care.    Janus Molder, MD  Hematology/Oncology  [phone]

## 2014-07-08 NOTE — Progress Notes (Signed)
Head to toe assessment complete.  Vital signs obtained. Pt up to bsc with 2 person assist.  Pt incontinent before able to sit on commode. Pericare complete. Pt assisted pt back to bed. Pt resting in bed at this time. AM medication administered. Pt tolerated well.  Pt denies pain. Call light in hand.  Pt verbalizes correct use.  Bed alarm set. Wife at bedside. Will continue to monitor. Electronically signed by Eloy End, RN on 07/08/2014 at 8:33 AM

## 2014-07-08 NOTE — Progress Notes (Addendum)
Pt to u/s. Electronically signed by Eloy End, RN on 07/08/2014 at 6:29 PM    Return to room. Electronically signed by Eloy End, RN on 07/08/2014 at 6:47 PM

## 2014-07-09 ENCOUNTER — Encounter: Attending: Hematology & Oncology | Primary: Internal Medicine

## 2014-07-09 LAB — CBC WITH AUTO DIFFERENTIAL
Basophils %: 0.7 %
Basophils Absolute: 0 10*3/uL (ref 0.0–0.2)
Eosinophils %: 1.2 %
Eosinophils Absolute: 0 10*3/uL (ref 0.0–0.6)
Hematocrit: 22.8 % — ABNORMAL LOW (ref 40.5–52.5)
Hemoglobin: 7.6 g/dL — ABNORMAL LOW (ref 13.5–17.5)
Lymphocytes %: 59.6 %
Lymphocytes Absolute: 1.1 10*3/uL (ref 1.0–5.1)
MCH: 40.4 pg — ABNORMAL HIGH (ref 26.0–34.0)
MCHC: 33.4 g/dL (ref 31.0–36.0)
MCV: 120.9 fL — ABNORMAL HIGH (ref 80.0–100.0)
MPV: 8.6 fL (ref 5.0–10.5)
Monocytes %: 11 %
Monocytes Absolute: 0.2 10*3/uL (ref 0.0–1.3)
Neutrophils %: 27.5 %
Neutrophils Absolute: 0.5 10*3/uL — CL (ref 1.7–7.7)
Platelets: 47 10*3/uL — ABNORMAL LOW (ref 135–450)
RBC: 1.89 M/uL — ABNORMAL LOW (ref 4.20–5.90)
RDW: 18.8 % — ABNORMAL HIGH (ref 12.4–15.4)
WBC: 1.8 10*3/uL — CL (ref 4.0–11.0)

## 2014-07-09 LAB — HEPATIC FUNCTION PANEL
ALT: 20 U/L (ref 10–40)
AST: 35 U/L (ref 15–37)
Albumin: 1.9 g/dL — ABNORMAL LOW (ref 3.4–5.0)
Alkaline Phosphatase: 129 U/L (ref 40–129)
Bilirubin, Direct: 0.4 mg/dL — ABNORMAL HIGH (ref 0.0–0.3)
Bilirubin, Indirect: 0.7 mg/dL (ref 0.0–1.0)
Total Bilirubin: 1.1 mg/dL — ABNORMAL HIGH (ref 0.0–1.0)
Total Protein: 5.5 g/dL — ABNORMAL LOW (ref 6.4–8.2)

## 2014-07-09 LAB — BASIC METABOLIC PANEL
Anion Gap: 5 (ref 3–16)
BUN: 16 mg/dL (ref 7–20)
CO2: 29 mmol/L (ref 21–32)
Calcium: 8.1 mg/dL — ABNORMAL LOW (ref 8.3–10.6)
Chloride: 108 mmol/L (ref 99–110)
Creatinine: 0.8 mg/dL (ref 0.8–1.3)
GFR African American: >60 (ref >60)
GFR Non-African American: >60 (ref >60)
Glucose: 118 mg/dL — ABNORMAL HIGH (ref 70–99)
Potassium: 3.9 mmol/L (ref 3.5–5.1)
Sodium: 142 mmol/L (ref 136–145)

## 2014-07-09 LAB — URINALYSIS
Bilirubin Urine: NEGATIVE
Blood, Urine: NEGATIVE
Glucose, Ur: NEGATIVE mg/dL
Ketones, Urine: NEGATIVE mg/dL
Leukocyte Esterase, Urine: NEGATIVE
Nitrite, Urine: NEGATIVE
Protein, UA: NEGATIVE mg/dL
Specific Gravity, UA: 1.014 (ref 1.005–1.030)
Urobilinogen, Urine: 1 E.U./dL (ref <2.0)
pH, UA: 6.5 (ref 5.0–8.0)

## 2014-07-09 LAB — AMMONIA: Ammonia: 55 umol/L (ref 16–60)

## 2014-07-09 MED ORDER — LACTULOSE 10 GM/15ML PO SOLN
10 GM/15ML | ORAL | Status: DC
Start: 2014-07-09 — End: 2014-07-15
  Administered 2014-07-09 – 2014-07-15 (×27): 20 g via ORAL

## 2014-07-09 MED ORDER — LIDOCAINE HCL (PF) 1 % IJ SOLN
1 % | INTRAMUSCULAR | Status: AC
Start: 2014-07-09 — End: 2014-07-09
  Administered 2014-07-09: 13:00:00 30

## 2014-07-09 MED ORDER — ENOXAPARIN SODIUM 80 MG/0.8ML SC SOLN
80 MG/0.8ML | Freq: Two times a day (BID) | SUBCUTANEOUS | Status: DC
Start: 2014-07-09 — End: 2014-07-10

## 2014-07-09 MED FILL — MORPHINE SULFATE (PF) 4 MG/ML IV SOLN: 4 mg/mL | INTRAVENOUS | Qty: 1

## 2014-07-09 MED FILL — XIFAXAN 550 MG PO TABS: 550 MG | ORAL | Qty: 1

## 2014-07-09 MED FILL — LISINOPRIL 5 MG PO TABS: 5 MG | ORAL | Qty: 1

## 2014-07-09 MED FILL — LIDOCAINE HCL (PF) 1 % IJ SOLN: 1 % | INTRAMUSCULAR | Qty: 30

## 2014-07-09 MED FILL — LOVENOX 40 MG/0.4ML SC SOLN: 40 MG/0.4ML | SUBCUTANEOUS | Qty: 0.4

## 2014-07-09 MED FILL — NORMAL SALINE FLUSH 0.9 % IV SOLN: 0.9 % | INTRAVENOUS | Qty: 10

## 2014-07-09 MED FILL — LACTULOSE 10 GM/15ML PO SOLN: 10 GM/15ML | ORAL | Qty: 30

## 2014-07-09 MED FILL — CARVEDILOL 3.125 MG PO TABS: 3.125 MG | ORAL | Qty: 1

## 2014-07-09 MED FILL — SODIUM CHLORIDE 0.9 % IV SOLN: 0.9 % | INTRAVENOUS | Qty: 1000

## 2014-07-09 MED FILL — CITALOPRAM HYDROBROMIDE 20 MG PO TABS: 20 MG | ORAL | Qty: 1

## 2014-07-09 MED FILL — CARVEDILOL 6.25 MG PO TABS: 6.25 MG | ORAL | Qty: 1

## 2014-07-09 MED FILL — FUROSEMIDE 40 MG PO TABS: 40 MG | ORAL | Qty: 2

## 2014-07-09 MED FILL — EPLERENONE 25 MG PO TABS: 25 MG | ORAL | Qty: 2

## 2014-07-09 MED FILL — MAGNESIUM OXIDE 400 (241.3 MG) MG PO TABS: 400 (241.3 Mg) MG | ORAL | Qty: 1

## 2014-07-09 NOTE — Progress Notes (Signed)
Shift assessment complete, VSS  Denies needs at this time, family at bedside.  Bed alarm engaged, call light within reach.

## 2014-07-09 NOTE — Progress Notes (Signed)
Oncology and Hematology Care   Progress Note    Subjective:  More alert this morning, sitting up in chair eating breakfast.      Objective:  Medications:  ??? rifaximin  550 mg Oral BID   ??? magnesium oxide  400 mg Oral BID   ??? citalopram  10 mg Oral Daily   ??? furosemide  80 mg Oral BID   ??? sodium chloride flush  10 mL Intravenous 2 times per day   ??? enoxaparin  40 mg Subcutaneous Daily   ??? eplerenone  50 mg Oral Daily   ??? lisinopril  5 mg Oral Nightly   ??? carvedilol  3.125 mg Oral Daily with breakfast    And   ??? carvedilol  6.25 mg Oral QPM   ??? diclofenac  1 drop Both Eyes Daily   ??? lactulose  20 g Oral Q4H        Physical Exam:  Vitals:  BP 118/74 mmHg   Pulse 63   Temp(Src) 97.8 ??F (36.6 ??C) (Oral)   Resp 16   Ht 5\' 5"  (1.651 m)   Wt 175 lb (79.379 kg)   BMI 29.12 kg/m2   SpO2 93%    General: A/O x 2, NAD  HEENT: PERRL, no icterus  Lymph: No cervical, supraclavicular LAD  Resp: no resp distress, speaking in full sentences  Cardiovascular: RRR, no MRG  GI: Soft, no HSM  Skin: No jaundice, no petechiae  MSK: No pain, no muscle weakness  Extremities: No B/L edema, no atrophy  Neuro: CN 2-12 grossly intact, minimal astrixis  Psych: No confusion, no agitation    Labs Results:    CBC:   Recent Labs      07/07/14   1924  07/08/14   0441  07/08/14   0917  07/09/14   0531   WBC  1.9*  2.0*   --   1.8*   HGB  8.4*  7.7*   --   7.6*   HCT  24.7*  23.0*  24.7*  22.8*   MCV  119.2*  120.4*   --   120.9*   PLT  55*  47*   --   47*     BMP:   Recent Labs      07/07/14   1924  07/08/14   0441  07/09/14   0532   NA  135*  136  142   K  4.7  4.0  3.9   CL  100  102  108   CO2  28  28  29    BUN  22*  20  16   CREATININE  0.8  0.8  0.8     LIVER PROFILE:   Recent Labs      07/07/14   1924  07/09/14   0532   AST  43*  35   ALT  20  20   BILIDIR  0.4*  0.4*   BILITOT  1.2*  1.1*   ALKPHOS  129  129     PT/INR:    Lab Results   Component Value Date    PROTIME 14.7 07/07/2014    PROTIME 16.1 04/02/2014    PROTIME 14.9 01/10/2014    INR  1.28 07/07/2014    INR 1.40 04/02/2014    INR 1.30 01/10/2014    INR 1.17 05/14/2010    INR 1.18 02/13/2010    INR 1.15 10/17/2009     PTT:    Lab Results  Component Value Date    APTT 29.7 10/17/2009       Investigations Reviewed:       LIVER: Evaluation of the liver is limited. There is diffuse increased   echogenicity with no focal lesion noted. Somewhat nodular contour of the   liver is compatible with known cirrhosis.      BILIARY SYSTEM: Limited imaging of the gallbladder is grossly unremarkable.      Common bile duct is within normal limits measuring 6 mm.      KIDNEYS: Right kidney is nonvisualized.      Left kidney measures 10.1 x 4.9 x 5.3 cm and is grossly unremarkable.      PANCREAS: Pancreas is nonvisualized.      SPLEEN: Spleen measures 10.1 cm. 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      IVC: The IVC is patent.      AORTA: Aorta is obscured.      OTHER: No evidence of ascites.         Assessment/Plan:   Macrocytic Pancytopenia - B12, folate: OK, retic low, MMA, SPEP, sFLC: pending.  Will proceed w/ BMBx today, again, suspect counts are related to ESLD.  No need for pRBC, plt or gCSF today.  Will omit LMWH given BMbx this morning and low plts.     Dispo - Will arrange for f/u w/ HemOnc once pt is ready to be dc'ed to go over results of labs and BMBx.    I have discussed the above stated plan with the patient and they verbalized understanding and agreed with the plan. Thank you for allowing [place] to participate in this patients care.    Janus Molder, MD  Hematology/Oncology  [phone]

## 2014-07-09 NOTE — Progress Notes (Signed)
Bedside report received from [place], [place].  VSS, pt denies pain.

## 2014-07-09 NOTE — Progress Notes (Signed)
Consent signed for bone marrow biopsy being done by Dr. Mariella Saa.

## 2014-07-09 NOTE — Progress Notes (Signed)
Progress Note - Dr. Tasia Catchings - Internal Medicine  PCP: Alecia Lemming, DO [place] / [place] [address] [phone]    Hospital Day: 1  Code Status: Full Code  Current Diet: DIET GENERAL; No Added Salt (3-4 GM)        CC: follow up on medical issues    Subjective:   Andrew [name] is a 74 y.o. male.    He denies problems    Doing a little better  NH3 down to 55  Still a little confused though    Pt/ot recommending ARU  Consult placed for Dr Kathleene Hazel    He denies chest pain, denies shortness of breath, denies nausea,  denies emesis.    10 system Review of Systems is reviewed with patient, and pertinent positives are listed here: None . Otherwise, Review of systems is negative.     I have reviewed the patient's medical and social history in detail and updated the computerized patient record.  To recap: He  has a past medical history of Hypertension; Kidney stones; Alpha 1 antitrypsin deficiency; Thrombocytopenia ([place]); BPH (benign prostatic hyperplasia); Cirrhosis, nonalcoholic ([place]); Chronic systolic CHF (congestive heart failure) ([place]); CAD (coronary artery disease); Compression fracture of lumbar vertebra ([place]); Leukopenia; Anemia; Pneumonia; DVT (deep venous thrombosis) ([place]); Cancer ([place]); and Hyperlipidemia.. He  has past surgical history that includes AV fistula repair (1970's); Colonoscopy (4/12); Cardiac catheterization; eye surgery (04/12/2014); and Cosmetic surgery (04/2013).[name] He  reports that he has never smoked. He has never used smokeless tobacco. He reports that he does not drink alcohol or use illicit drugs..        Active Hospital Problems    Diagnosis Date Noted   ??? Hepatic encephalopathy ([place]) [K72.90] 07/07/2014   ??? Ataxia [R27.0] 07/07/2014   ??? Pancytopenia ([place]) [D61.818] 07/07/2014   ??? Benign essential HTN [I10] 07/03/2014   ??? Hyperlipidemia [E78.5]    ??? Cirrhosis, nonalcoholic ([facility]) [Z66.06]        Current facility-administered medications: rifaximin (XIFAXAN) tablet 550 mg, 550 mg, Oral,  BID  magnesium oxide (MAG-OX) tablet 400 mg, 400 mg, Oral, BID  albuterol sulfate HFA 108 (90 BASE) MCG/ACT inhaler 2 puff, 2 puff, Inhalation, Q6H PRN  citalopram (CELEXA) tablet 10 mg, 10 mg, Oral, Daily  furosemide (LASIX) tablet 80 mg, 80 mg, Oral, BID  potassium chloride SA (K-DUR;KLOR-CON M) tablet 40 mEq, 40 mEq, Oral, PRN **OR** potassium chloride 20 MEQ/15ML (10%) oral solution 40 mEq, 40 mEq, Oral, PRN **OR** potassium chloride 10 mEq/100 mL IVPB (Peripheral Line), 10 mEq, Intravenous, PRN  0.9 % sodium chloride infusion, , Intravenous, Continuous  sodium chloride flush 0.9 % injection 10 mL, 10 mL, Intravenous, 2 times per day  sodium chloride flush 0.9 % injection 10 mL, 10 mL, Intravenous, PRN  acetaminophen (TYLENOL) tablet 650 mg, 650 mg, Oral, Q4H PRN  HYDROcodone-acetaminophen (NORCO) 5-325 MG per tablet 1 tablet, 1 tablet, Oral, Q4H PRN **OR** HYDROcodone-acetaminophen (NORCO) 5-325 MG per tablet 2 tablet, 2 tablet, Oral, Q4H PRN  morphine (PF) injection 2 mg, 2 mg, Intravenous, Q2H PRN **OR** morphine (PF) injection 4 mg, 4 mg, Intravenous, Q2H PRN  magnesium hydroxide (MILK OF MAGNESIA) 400 MG/5ML suspension 30 mL, 30 mL, Oral, Daily PRN  ondansetron (ZOFRAN) injection 4 mg, 4 mg, Intravenous, Q6H PRN  famotidine (PEPCID) tablet 20 mg, 20 mg, Oral, BID PRN  enoxaparin (LOVENOX) injection 40 mg, 40 mg, Subcutaneous, Daily  eplerenone (INSPRA) tablet 50 mg, 50 mg, Oral, Daily  lisinopril (PRINIVIL;ZESTRIL) tablet  5 mg, 5 mg, Oral, Nightly  carvedilol (COREG) tablet 3.125 mg, 3.125 mg, Oral, Daily with breakfast **AND** carvedilol (COREG) tablet 6.25 mg, 6.25 mg, Oral, QPM  diclofenac (VOLTAREN) 0.1 % ophthalmic solution 1 drop, 1 drop, Both Eyes, Daily  lactulose (CHRONULAC) 10 GM/15ML solution 20 g, 20 g, Oral, Q4H         Objective:  BP 118/74 mmHg   Pulse 63   Temp(Src) 97.8 ??F (36.6 ??C) (Oral)   Resp 16   Ht 5\' 5"  (1.651 m)   Wt 175 lb (79.379 kg)   BMI 29.12 kg/m2   SpO2 93%     Patient Vitals  for the past 24 hrs:   BP Temp Temp src Pulse Resp SpO2 Weight   07/09/14 0751 118/74 mmHg 97.8 ??F (36.6 ??C) Oral 63 16 93 % -   07/09/14 0544 102/65 mmHg 98.3 ??F (36.8 ??C) Oral 57 16 95 % -   07/09/14 0022 103/60 mmHg 98.1 ??F (36.7 ??C) Oral 67 16 93 % 175 lb (79.379 kg)   07/08/14 2024 108/72 mmHg 97.8 ??F (36.6 ??C) - 62 16 94 % -   07/08/14 1619 106/63 mmHg 98.4 ??F (36.9 ??C) Axillary 57 16 93 % -   07/08/14 1302 106/69 mmHg 98.4 ??F (36.9 ??C) Oral 58 16 95 % -   07/08/14 1208 91/56 mmHg - - 64 18 93 % -   07/08/14 0838 104/61 mmHg 97.5 ??F (36.4 ??C) Oral 59 17 94 % -     Patient Vitals for the past 96 hrs (Last 3 readings):   Weight   07/09/14 0022 175 lb (79.379 kg)   07/07/14 1911 172 lb (78.019 kg)           Intake/Output Summary (Last 24 hours) at 07/09/14 2725  Last data filed at 07/09/14 0047   Gross per 24 hour   Intake    937 ml   Output    200 ml   Net    737 ml         Physical Exam:   S1, S2 normal, no murmur, rub or gallop, regular rate and rhythm  clear to auscultation bilaterally  abdomen is soft without significant tenderness, masses, organomegaly or guarding  extremities normal, atraumatic, no cyanosis or edema    Labs:  Lab Results   Component Value Date    WBC 1.8* 07/09/2014    HGB 7.6* 07/09/2014    HCT 22.8* 07/09/2014    PLT 47* 07/09/2014    CHOL 101 04/02/2014    TRIG 83 04/02/2014    HDL 22* 04/02/2014    ALT 20 07/09/2014    AST 35 07/09/2014    NA 142 07/09/2014    K 3.9 07/09/2014    CL 108 07/09/2014    CREATININE 0.8 07/09/2014    BUN 16 07/09/2014    CO2 29 07/09/2014    TSH 2.15 08/15/2013    PSA 0.55 08/15/2013    INR 1.28* 07/07/2014    LABA1C 5.0 01/10/2014    LABMICR Not Indicated 07/08/2014     Lab Results   Component Value Date    TROPONINI <0.01 07/07/2014       Recent Imaging Results are Reviewed:  Ct Head Wo Contrast    07/07/2014   EXAMINATION: CT OF THE HEAD WITHOUT CONTRAST  07/07/2014 11:11 pm  TECHNIQUE: CT of the head was performed without the administration of intravenous  contrast.  COMPARISON: 04/01/2014  HISTORY: ORDERING SYSTEM PROVIDED HISTORY: dizzy  TECHNOLOGIST PROVIDED HISTORY: Ordering Physician Provided Reason for Exam: dizzy  FINDINGS: BRAIN/VENTRICLES: There is no acute intracranial hemorrhage, mass effect or midline shift.  No abnormal extra-axial fluid collection.  The gray-white differentiation is maintained without evidence of an acute infarct.  There is no evidence of hydrocephalus.  ORBITS: The visualized portion of the orbits demonstrate no acute abnormality.  SINUSES: There is minimal mucosal thickening in the left maxillary sinus. The remaining visualized paranasal sinuses are clear.  The mastoid air cells are unremarkable.  SOFT TISSUES/SKULL:  No acute abnormality of the visualized skull or soft tissues.     07/07/2014   IMPRESSION: No acute intracranial abnormality.     Ct Chest W Contrast    06/13/2014   EXAMINATION: CT OF THE CHEST WITH CONTRAST 06/13/2014 3:56 pm  TECHNIQUE: CT of the chest was performed with the administration of intravenous contrast. Multiplanar reformatted images are provided for review.  COMPARISON: None.  HISTORY: Bacterial pneumonia, unspecified  FINDINGS: Mediastinum:  *A lymph node is seen pretracheal upper mediastinum interposed between the proximal left common carotid artery and brachiocephalic artery, image 31. This measures approximately 12 mm.  Subcarinal density possible subcarinal adenopathy, difficult to separate from adjacent fluid. *While there is no significant cardiac enlargement, there is chamber enlargement, left ventricle and left atrium.  This could indicate mitral valve disease.  Coronary calcification is present. *There is mild coronary calcification. *Vasculature is unremarkable for age. Lungs/pleura:  *Large pleural effusion is seen on the right and small effusion on the left. Multifocal patchy areas of airspace disease are seen bilaterally both upper and lower lobes.  Right lower lobe has a somewhat bronchus centric  distribution.  There is bronchiectasis bilaterally.  Focal area of consolidation is seen, right upper lobe anteriorly. *Airways are unremarkable. Soft Tissues/Bones: Bilateral gynecomastia is present.  Small nodular lesion projects from the inferior right thyroid gland.  Sagittal projection demonstrates multiple compression fractures L1 moderate to severe, L2 moderate, T9 moderate and T10 minimal.  Bones are demineralized.  These fractures are faintly noted on prior lateral chest radiograph.  Upper Abdomen: The liver is grossly abnormal, with small overall size, increased density and extensive nodularity throughout the serosal surface. The spleen is mildly enlarged.  There is are extensive unenhanced splenic varices.  Multiple unenhanced gastric and paraesophageal varices are also noted.  Small right renal calculus present     06/13/2014   IMPRESSION: Multifocal airspace disease most likely pneumonia with large pleural effusion primarily on the right.  Chronic bronchiectasis.  Left ventricular and atrial chamber enlargement could indicate mitral valve disease.  Advanced cirrhotic morphology of the liver with portal hypertension and extensive varices, unenhanced on current CT.  Multiple compression fractures essentially unchanged.  Gynecomastia.  Multiple prior moderate compression fractures.     US Abdomen Complete    07/08/2014   EXAMINATION: COMPLETE ABDOMINAL ULTRASOUND 07/08/2014 6:04 pm  COMPARISON: None  HISTORY: ORDERING SYSTEM PROVIDED HISTORY: CIRRHOSIS TECHNOLOGIST PROVIDED HISTORY: Ordering Physician Provided Reason for Exam: cirrhosis, r/o ascites Acuity: Acute Type of Exam: Initial  FINDINGS: Study is limited by body habitus.  LIVER: Evaluation of the liver is limited.  There is diffuse increased echogenicity with no focal lesion noted.  Somewhat nodular contour of the liver is compatible with known cirrhosis.  BILIARY SYSTEM: Limited imaging of the gallbladder is grossly unremarkable.  Common bile duct is  within normal limits measuring 6 mm.  KIDNEYS: Right kidney is nonvisualized.  Left kidney measures 10.1 x 4.9 x 5.3 cm  and is grossly unremarkable.  PANCREAS: Pancreas is nonvisualized.  SPLEEN: Spleen measures 10.1 cm.  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  IVC: The IVC is patent.  AORTA: Aorta is obscured.  OTHER: No evidence of ascites.     07/08/2014   IMPRESSION: Limited study as above.  Cirrhosis and portal hypertension.  No evidence of ascites.     Xr Chest Portable    07/08/2014   EXAMINATION: SINGLE VIEW OF THE CHEST  07/07/2014 11:34 pm  COMPARISON: 06/03/2014  HISTORY: ORDERING SYSTEM PROVIDED HISTORY: other TECHNOLOGIST PROVIDED HISTORY: Ordering Physician Provided Reason for Exam: dizziness Acuity: Unknown Type of Exam: Unknown  FINDINGS: Cardiac leads project over the chest.  Diffuse interstitial opacity is demonstrated bilaterally.  Heterogeneous opacity is seen adjacent to the left heart border.  No evidence of pleural effusion.  Negative for pneumothorax. Cardiac and mediastinal silhouettes are similar to prior.  Old posterior left 7th rib fracture.     07/08/2014   IMPRESSION: Mild pulmonary edema.     Cta Pulmonary With Contrast    06/27/2014   EXAMINATION: CTA OF THE CHEST 06/27/2014 2:53 pm  TECHNIQUE: CTA of the chest was performed after the administration of intravenous contrast.  Multiplanar reformatted images are provided for review.  MIP images are provided for review.  Patient received 100 mL Isovue 370 intravenous contrast.  COMPARISON: CT chest with contrast June 13, 2014.  HISTORY: ORDERING SYSTEM PROVIDED HISTORY: DVT (deep venous thrombosis), unspecified laterality [facility]) TECHNOLOGIST PROVIDED HISTORY: Ordering Physician Provided Reason for Exam: DVT, SOB Injury/Trauma or Illness: Illness/Other Acuity: Acute Type of Exam: Initial Relevant Medica/Surgical History: CHF, cardiomyopathy  FINDINGS: Pulmonary Arteries: Pulmonary arteries are adequately opacified for evaluation.  No evidence of intraluminal  filling defect to suggest pulmonary embolism.  Main pulmonary artery is normal in caliber.  Mediastinum: No evidence of mediastinal lymphadenopathy.  Heart is enlarged, similar in appearance.  The heart and pericardium demonstrate no acute abnormality.  There is no acute abnormality of the thoracic aorta. Nodularity of thyroid similar in appearance.  Lungs/pleura: The central airways are patent.  There is a moderate right-sided pleural effusion which appears slightly decreased compared to prior study.  There is mild patchy dependent opacities in the right upper lobe, similar in appearance.  There are dependent subpleural opacities within the lingula and left lower lobe.  There is mild opacity within the right middle lobe which is linear in appearance in appears slightly improved compared to prior study.  2 nodular appearing densities are again seen in the right lower lobe on image number 65, unchanged.  No pneumothorax.  Upper Abdomen: Cirrhotic liver with mildly distended gallbladder is similar in appearance to prior study.  Soft Tissues/Bones: Multiple compression fractures again seen, most pronounced at the thoracolumbar junction, similar in appearance.  Old right-sided rib fractures are also seen.  No acute bone or soft tissue abnormality.  Gynecomastia is again seen.     06/27/2014   IMPRESSION: No evidence of pulmonary embolism.  Moderate right pleural effusion, minimally decreased.  Persistent patchy areas of airspace opacity which may be related to atelectasis versus pneumonia, slightly improved.  2 nodular appearing densities are seen within the right lower lobe which may be related to the airspace disease or represent lung nodules.  Follow-up recommended with dedicated chest CT in 3 months.  Remote compression fractures again seen.  Cirrhotic liver.     Vl Extremity Venous Bilateral    06/27/2014   Lower Extremities DVT Study   Demographics  Patient Name       ARTHURO CANELO    Date of Study      06/27/2014         Gender              Male    Patient Number     [id number]        Date of Birth       August 12, 1940    Visit Number       [id number]       Age                 68 year(s)    Accession Number   [id number]         Room Number    Corporate ID       [id number]          Sonographer         Erasmo Leventhal,                                                           [facility]    Ordering Physician Lacretia Leigh, MD  Interpreting        [facility]                                       Physician           Darlyn Chamber MD,                                                           [facility]   Procedure  Type of Study:    Veins:Lower Extremities DVT Study, VASC EXTREMITY VENOUS DUPLEX BILATERAL.    Vascular Sonographer Report   Additional Indications:Swelliing  Impressions Right Impression Acute partially occluding deep vein thrombosis involving the right set of gastroc veins zone 5-6. No other evidence of deep vein or superficial vein thrombosis involving the right lower extremity. Reflux noted in the right FV. Left Impression No evidence of deep vein or superficial vein thrombosis involving the left lower extremity.  Verbal to Dr. Jeb Levering. Patient released.  Conclusions    Summary    -Acute partially occluding deep vein thrombosis involving the right set of  gastroc veins zone 5-6.  -Reflux noted in the right FV.    Signature    ------------------------------------------------------------------  Electronically signed by Darlyn Chamber MD, [facility] (Interpreting  physician) on 06/27/2014 at 01:38 PM  ------------------------------------------------------------------   Patient Status:Routine. [place] - Vascular Lab. Technical Quality:Adequate visualization.  Velocities are measured in cm/s ; Diameters are measured in mm  Right Lower Extremities DVT Study Measurements Right 2D Measurements +------------------------+----------+---------------+----------+ !Location                 !Visualized!Compressibility!Thrombosis! +------------------------+----------+---------------+----------+ !Sapheno Femoral Junction!Yes       !Yes            !None      ! +------------------------+----------+---------------+----------+ !GSV Thigh               !Yes       !  Yes            !None      ! +------------------------+----------+---------------+----------+ !Common Femoral          !Yes       !Yes            !None      ! +------------------------+----------+---------------+----------+ !Prox Femoral            !Yes       !Yes            !None      ! +------------------------+----------+---------------+----------+ !Mid Femoral             !Yes       !Yes            !None      ! +------------------------+----------+---------------+----------+ !Dist Femoral            !Yes       !Yes            !None      ! +------------------------+----------+---------------+----------+ !Deep Femoral            !Yes       !Yes            !None      ! +------------------------+----------+---------------+----------+ !Popliteal               !Yes       !Yes            !None      ! +------------------------+----------+---------------+----------+ !GSV Below Knee          !Yes       !Yes            !None      ! +------------------------+----------+---------------+----------+ !Gastroc                 !Yes       !Partial        !Acute     ! +------------------------+----------+---------------+----------+ !PTV                     !Yes       !Yes            !None      ! +------------------------+----------+---------------+----------+ !Peroneal                !Yes       !Yes            !None      ! +------------------------+----------+---------------+----------+ !SSV                     !Yes       !Yes            !None      ! +------------------------+----------+---------------+----------+  Right Doppler Measurements +------------------------+------+------+------------+ !Location                !Signal!Reflux!Reflux (sec)!  +------------------------+------+------+------------+ !Sapheno Femoral Junction!Phasic!No    !            ! +------------------------+------+------+------------+ !Common Femoral          !Phasic!No    !            ! +------------------------+------+------+------------+ !Femoral                 !Phasic!Yes   !            ! +------------------------+------+------+------------+ !Deep Femoral            !Phasic!No    !            ! +------------------------+------+------+------------+ !  Popliteal               !Phasic!No    !            ! +------------------------+------+------+------------+  Left Lower Extremities DVT Study Measurements Left 2D Measurements +------------------------+----------+---------------+----------+ !Location                !Visualized!Compressibility!Thrombosis! +------------------------+----------+---------------+----------+ !Sapheno Femoral Junction!Yes       !Yes            !None      ! +------------------------+----------+---------------+----------+ !GSV Thigh               !Yes       !Yes            !None      ! +------------------------+----------+---------------+----------+ !Common Femoral          !Yes       !Yes            !None      ! +------------------------+----------+---------------+----------+ !Prox Femoral            !Yes       !Yes            !None      ! +------------------------+----------+---------------+----------+ !Mid Femoral             !Yes       !Yes            !None      ! +------------------------+----------+---------------+----------+ !Dist Femoral            !Yes       !Yes            !None      ! +------------------------+----------+---------------+----------+ !Deep Femoral            !Yes       !Yes            !None      ! +------------------------+----------+---------------+----------+ !Popliteal               !Yes       !Yes            !None      ! +------------------------+----------+---------------+----------+ !GSV Below Knee          !Yes       !Yes             !None      ! +------------------------+----------+---------------+----------+ !Gastroc                 !Yes       !Yes            !None      ! +------------------------+----------+---------------+----------+ !PTV                     !Yes       !Yes            !None      ! +------------------------+----------+---------------+----------+ !Peroneal                !Yes       !Yes            !None      ! +------------------------+----------+---------------+----------+ !SSV                     !Yes       !Yes            !None      ! +------------------------+----------+---------------+----------+  Left  Doppler Measurements +------------------------+------+------+------------+ !Location                !Signal!Reflux!Reflux (sec)! +------------------------+------+------+------------+ !Sapheno Femoral Junction!Phasic!No    !            ! +------------------------+------+------+------------+ !Common Femoral          !Phasic!No    !            ! +------------------------+------+------+------------+ !Femoral                 !Phasic!No    !            ! +------------------------+------+------+------------+ !Deep Femoral            !Phasic!No    !            ! +------------------------+------+------+------------+ !Popliteal               !Phasic!No    !            ! +------------------------+------+------+------------+      Assessment and Plan:  Patient Active Hospital Problem List:   Hepatic encephalopathy ([place]) (07/07/2014)    Assessment: nh3 improved, still confused    Plan: Continue present orders/plan   Cirrhosis, nonalcoholic ([place]) ()    Assessment: Stable    Plan: Continue present orders/plan   Hyperlipidemia ()    Assessment: Stable    Plan: Continue present orders/plan   Benign essential HTN (07/03/2014)    Assessment: Stable    Plan: Continue present orders/plan   Ataxia (07/07/2014)    Assessment: due to liver dz?    Plan: pt/ot rec ARU   Pancytopenia ([place]) (07/07/2014)    Assessment: still pancytopenic    Plan: workup in  progress per Dr Luanna Cole  07/09/2014

## 2014-07-09 NOTE — Progress Notes (Signed)
Bedside handoff given to and completed with Loretta, RN.

## 2014-07-09 NOTE — Progress Notes (Signed)
Pt in bed, eating supper, alarm on, call light within reach, family at bedside.  Pt denies any needs at this time.

## 2014-07-09 NOTE — Progress Notes (Signed)
1 large loose BM in BSC. Incontinent of urine in bed. Peri care, new linens, and gown provided. Wife at bedside.

## 2014-07-09 NOTE — Progress Notes (Signed)
Morphine given before procedure.

## 2014-07-09 NOTE — Progress Notes (Signed)
Calling transport to transfer patient to vas lab.

## 2014-07-09 NOTE — Consults (Signed)
PM&R    Chart reviewed. Very unlikely that this patient would be approved by [facility] for ARU. Would recommend ECF.     Daja Shuping A. Kathleene Hazel, MD 07/09/2014, 11:38 AM

## 2014-07-09 NOTE — Progress Notes (Addendum)
Occupational Therapy/Physical Therapy    HOLD OT tx this AM. Doppler ordered to r/o DVT. Will follow-up with pt pending results and as schedule allows.    Thanks,  Kristine Linea, [place], OTR/L [id number]  Crisoforo Oxford PT DPT [phone]

## 2014-07-09 NOTE — Progress Notes (Signed)
Bone marrow biopsy beginning.

## 2014-07-09 NOTE — Progress Notes (Signed)
Bedside handoff received from and completed with Darden Dates, RN.

## 2014-07-09 NOTE — Procedures (Signed)
PROCEDURE:  Bone Marrow Aspirate and Bone Marrow Biopsy.     PROCEDURE DIAGNOSIS:  pancytopenia     INDICATION:  same     CONSENT: Consent was obtained from the patient by:  1. Explaining the risks [bleeding and bruising], as well as the benefits [an understanding of the current diagnosis to make treatment decisions]; 2.  Answering the patient???s questions.   The patient voiced an understanding of the risks / benefits and the answers to their questions, then proceeded to sign and date the consent form.      SURGEON: Omaira Mellen M Damaria Vachon    Procedure Side: (Left or Right):  right    PROCEDURE SUMMARY: A time out was performed verifying the patient date of birth, procedure and location of the procedure.  The patient was laid in the lateral decubitus position.  The posterior superior iliac spine was identified, prepped with ChloraPrep for 30 second scrub and finally draped in sterile fashion.  The crest of the posterior superior iliac spine was anesthetized with approximately 3 mg 1% Lidocaine.  The posterior superior iliac crest was then anesthetized with 7 ml of 1% Lidocaine.  A Jamshidi needle was positioned and approximately 1 ml of uncoagulated bone marrow aspirate was obtained without difficulty.  A second syringe, conditioned with heparin was placed on the Jamshidi needle and approximately 10 ml of bone marrow aspirate was again obtained without difficulty.  The Jamshidi was withdrawn and repositioned into a second site then advanced through the cortical bone.  The stylus of the Jamshidi needle was removed and the hollow Jamshidi needle was advanced into the bone cavity.  The Jamshidi needle was then removed and a core biopsy of approximately 2.5 cm was obtained without any complications.       COMPLICATIONS: None      ESTIMATED BLOOD LOSS: Trace     SAMPLES: Approximately 8 to 10 ml of bone marrow aspirate with visible spicules; a 2.5 cm core biopsy placed in formalin.        Paulino Rily, MD

## 2014-07-09 NOTE — Progress Notes (Signed)
New PIV in left forearm placed. Old PIV infiltrated in right forearm and was taken out.

## 2014-07-09 NOTE — Progress Notes (Signed)
VSS Assessment complete, see doc flowsheets. PO meds given, pt tolerated well. PIV infusing without complications. Pt resting comfortably in bed. States pain level is a 0 out of 10. Denies any further needs at this time. Call light is within reach. Fall precautions in place. Patient lying flat after bone marrow biopsy.

## 2014-07-09 NOTE — Progress Notes (Signed)
Pt assisted to [facility]. Large urine output. Specimen sent to lab. Wife at bedside. Pt repositioned in bed.

## 2014-07-09 NOTE — Progress Notes (Addendum)
[facility]  Gastroenterology Progress Note    Andrew [name] is a 74 y.o. male patient.  Principal Problem:    Hepatic encephalopathy (HCC)  Active Problems:    Cirrhosis, nonalcoholic (HCC)    Hyperlipidemia    Benign essential HTN    Ataxia    Pancytopenia (HCC)      SUBJECTIVE:  Sleeping soundly after BMBx. Received Morphine. Per pt's wife, his mental status has improved. He had 2 BMs this morning.     ROS:   Unable to obtain    Physical    VITALS:  BP 118/74 mmHg   Pulse 63   Temp(Src) 97.8 ??F (36.6 ??C) (Oral)   Resp 16   Ht 5\' 5"  (1.651 m)   Wt 175 lb (79.379 kg)   BMI 29.12 kg/m2   SpO2 93%  TEMPERATURE:  Current - Temp: 97.8 ??F (36.6 ??C); Max - Temp  Avg: 98.1 ??F (36.7 ??C)  Min: 97.8 ??F (36.6 ??C)  Max: 98.4 ??F (36.9 ??C)    NAD, difficult to awaken - had received morphine. Unable to assess for asterixis.  Regular rate   Lungs CTA Bilaterally  Abdomen soft, ND, NT,  Bowel sounds normal.    Data    Data Review:    Recent Labs      07/07/14   1924  07/08/14   0441  07/08/14   0917  07/09/14   0531   WBC  1.9*  2.0*   --   1.8*   HGB  8.4*  7.7*   --   7.6*   HCT  24.7*  23.0*  24.7*  22.8*   MCV  119.2*  120.4*   --   120.9*   PLT  55*  47*   --   47*     Recent Labs      07/07/14   1924  07/08/14   0441  07/09/14   0532   NA  135*  136  142   K  4.7  4.0  3.9   CL  100  102  108   CO2  28  28  29    BUN  22*  20  16   CREATININE  0.8  0.8  0.8     Recent Labs      07/07/14   1924  07/09/14   0532   AST  43*  35   ALT  20  20   BILIDIR  0.4*  0.4*   BILITOT  1.2*  1.1*   ALKPHOS  129  129     No results for input(s): LIPASE, AMYLASE in the last 72 hours.  Recent Labs      07/07/14   1924   PROTIME  14.7*   INR  1.28*     No results for input(s): PTT in the last 72 hours.    Abdominal US: 07/08/14  IMPRESSION:   Limited study as above.      Cirrhosis and portal hypertension.      No evidence of ascites.         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 Followed by Dr. Einar Grad. No signs of GI bleeding. No ascites on [place].    Hepatic encephalopathy - No GI bleeding or AKI which can precipitate HE. No PNA on CXR, although CXR c/w pulmonary edema. UA trace leuk esterase but urine cx with < 25,000 Enterococcus. No ascites. Suspect encephalopathy was from taking less lactulose at home.  Macrocytic anemia - no signs of GI bleed. Negative EGD and colonoscopy Nov 2015. No iron, folate, or B12 deficiency back in Nov. Heme following. S/p BMBx today.  Pancytopenia - heme following.     PLAN :  - continue Xifaxan BID  - lactulose - titrate frequency for a goal of 3 BMs daily    Discussed with Dr. Cottie Banda, PA-C  [facility]  I have personally performed a face to face diagnostic evaluation on this patient.  I have interviewed and examined the patient and I agree with the findings and recommended plan of care.  In summary, my findings and plan are the following: Less confused, but still weak.  MS slowly improving.  Decrease lactulose when >3 BM/day.    Norwood Levo, MD  [facility]  07/09/2014

## 2014-07-09 NOTE — Progress Notes (Signed)
Called Dr. Tasia Catchings regarding the results of the right leg venous doppler. Order from Dr. Tasia Catchings is lovenox 1mg /kg q12h.

## 2014-07-09 NOTE — Progress Notes (Signed)
Scheduled lactulose given. New bag of fluids hung. No needs expressed at this time. Will continue to monitor.

## 2014-07-09 NOTE — Progress Notes (Signed)
This RN aware of panic lab on WBC count and neutropenic precautions already in place.

## 2014-07-10 ENCOUNTER — Inpatient Hospital Stay: Attending: Internal Medicine | Primary: Internal Medicine

## 2014-07-10 LAB — TYPE AND SCREEN
ABO/Rh: A NEG
Antibody Screen: NEGATIVE

## 2014-07-10 LAB — BASIC METABOLIC PANEL
Anion Gap: 7 (ref 3–16)
BUN: 12 mg/dL (ref 7–20)
CO2: 28 mmol/L (ref 21–32)
Calcium: 7.9 mg/dL — ABNORMAL LOW (ref 8.3–10.6)
Chloride: 110 mmol/L (ref 99–110)
Creatinine: 0.8 mg/dL (ref 0.8–1.3)
GFR African American: >60 (ref >60)
GFR Non-African American: >60 (ref >60)
Glucose: 100 mg/dL — ABNORMAL HIGH (ref 70–99)
Potassium: 3.6 mmol/L (ref 3.5–5.1)
Sodium: 145 mmol/L (ref 136–145)

## 2014-07-10 LAB — CULTURE, URINE
Urine Culture, Routine: 25000
Urine Culture, Routine: <10000

## 2014-07-10 LAB — ELECTROPHORESIS PROTEIN, SERUM
Albumin: 1.8 g/dL — ABNORMAL LOW (ref 3.1–4.9)
Alpha-1-Globulin: 0 g/dL — ABNORMAL LOW (ref 0.2–0.4)
Alpha-2-Globulin: 0.2 g/dL — ABNORMAL LOW (ref 0.4–1.1)
Beta Globulin: 0.8 g/dL — ABNORMAL LOW (ref 0.9–1.6)
Gamma Globulin: 2.3 g/dL — ABNORMAL HIGH (ref 0.6–1.8)
Total Protein: 5.2 g/dL — ABNORMAL LOW (ref 6.4–8.2)

## 2014-07-10 LAB — CBC WITH AUTO DIFFERENTIAL
Basophils %: 0.3 %
Basophils Absolute: 0 10*3/uL (ref 0.0–0.2)
Eosinophils %: 1.3 %
Eosinophils Absolute: 0 10*3/uL (ref 0.0–0.6)
Hematocrit: 23 % — ABNORMAL LOW (ref 40.5–52.5)
Hemoglobin: 7.7 g/dL — ABNORMAL LOW (ref 13.5–17.5)
Lymphocytes %: 61.9 %
Lymphocytes Absolute: 1.2 10*3/uL (ref 1.0–5.1)
MCH: 40.1 pg — ABNORMAL HIGH (ref 26.0–34.0)
MCHC: 33.5 g/dL (ref 31.0–36.0)
MCV: 119.5 fL — ABNORMAL HIGH (ref 80.0–100.0)
MPV: 9 fL (ref 5.0–10.5)
Monocytes %: 10 %
Monocytes Absolute: 0.2 10*3/uL (ref 0.0–1.3)
Neutrophils %: 26.5 %
Neutrophils Absolute: 0.5 10*3/uL — CL (ref 1.7–7.7)
Platelets: 49 10*3/uL — ABNORMAL LOW (ref 135–450)
RBC: 1.92 M/uL — ABNORMAL LOW (ref 4.20–5.90)
RDW: 18.8 % — ABNORMAL HIGH (ref 12.4–15.4)
WBC: 1.9 10*3/uL — CL (ref 4.0–11.0)

## 2014-07-10 LAB — PREPARE RBC (CROSSMATCH)

## 2014-07-10 LAB — KAPPA/LAMBDA QUANTITATIVE FREE LIGHT CHAINS, SERUM
Kappa Free Light Chains QNT: 8.8 mg/dL — ABNORMAL HIGH (ref 0.33–1.94)
Kappa/Lambda Fluid C Ratio: 1.26 (ref 0.26–1.65)
Lambda Free Light Chains QNT: 6.98 mg/dL — ABNORMAL HIGH (ref 0.57–2.63)

## 2014-07-10 LAB — METHYLMALONIC ACID, SERUM: Methylmalonic Acid: 0.24 umol/L (ref 0.00–0.40)

## 2014-07-10 MED ORDER — ENOXAPARIN SODIUM 80 MG/0.8ML SC SOLN
80 MG/0.8ML | Freq: Two times a day (BID) | SUBCUTANEOUS | Status: DC
Start: 2014-07-10 — End: 2014-07-15
  Administered 2014-07-11 – 2014-07-15 (×10): 80 mg/kg via SUBCUTANEOUS

## 2014-07-10 MED ORDER — LACTULOSE 10 GM/15ML PO SOLN
1015 GM/15ML | Freq: Once | ORAL | Status: AC
Start: 2014-07-10 — End: 2014-07-10
  Administered 2014-07-10: 22:00:00 via RECTAL

## 2014-07-10 MED ORDER — ENOXAPARIN SODIUM 80 MG/0.8ML SC SOLN
80 MG/0.8ML | Freq: Two times a day (BID) | SUBCUTANEOUS | Status: DC
Start: 2014-07-10 — End: 2014-07-10

## 2014-07-10 MED ORDER — SODIUM CHLORIDE 0.9 % IV BOLUS
0.9 % | Freq: Once | INTRAVENOUS | Status: AC | PRN
Start: 2014-07-10 — End: 2014-07-10

## 2014-07-10 MED ORDER — ENOXAPARIN SODIUM 80 MG/0.8ML SC SOLN
80 MG/0.8ML | Freq: Two times a day (BID) | SUBCUTANEOUS | Status: DC
Start: 2014-07-10 — End: 2014-07-10
  Administered 2014-07-10: 12:00:00 80 mg/kg via SUBCUTANEOUS

## 2014-07-10 MED ORDER — DIPHENHYDRAMINE HCL 25 MG PO TABS
25 MG | ORAL | Status: DC
Start: 2014-07-10 — End: 2014-07-15
  Administered 2014-07-10: 16:00:00 25 mg via ORAL

## 2014-07-10 MED ORDER — FUROSEMIDE 10 MG/ML IJ SOLN
10 MG/ML | INTRAMUSCULAR | Status: DC
Start: 2014-07-10 — End: 2014-07-14
  Administered 2014-07-10: 19:00:00 20 mg via INTRAVENOUS

## 2014-07-10 MED FILL — MAGNESIUM OXIDE 400 (241.3 MG) MG PO TABS: 400 (241.3 Mg) MG | ORAL | Qty: 1

## 2014-07-10 MED FILL — LACTULOSE 10 GM/15ML PO SOLN: 10 GM/15ML | ORAL | Qty: 30

## 2014-07-10 MED FILL — SODIUM CHLORIDE 0.9 % IV SOLN: 0.9 % | INTRAVENOUS | Qty: 1000

## 2014-07-10 MED FILL — CARVEDILOL 6.25 MG PO TABS: 6.25 MG | ORAL | Qty: 1

## 2014-07-10 MED FILL — LOVENOX 80 MG/0.8ML SC SOLN: 80 MG/0.8ML | SUBCUTANEOUS | Qty: 0.8

## 2014-07-10 MED FILL — LISINOPRIL 5 MG PO TABS: 5 MG | ORAL | Qty: 1

## 2014-07-10 MED FILL — CARVEDILOL 3.125 MG PO TABS: 3.125 MG | ORAL | Qty: 1

## 2014-07-10 MED FILL — XIFAXAN 550 MG PO TABS: 550 MG | ORAL | Qty: 1

## 2014-07-10 MED FILL — FUROSEMIDE 40 MG PO TABS: 40 MG | ORAL | Qty: 2

## 2014-07-10 MED FILL — EPLERENONE 25 MG PO TABS: 25 MG | ORAL | Qty: 2

## 2014-07-10 MED FILL — DIPHENHYDRAMINE HCL 25 MG PO TABS: 25 MG | ORAL | Qty: 1

## 2014-07-10 MED FILL — NORMAL SALINE FLUSH 0.9 % IV SOLN: 0.9 % | INTRAVENOUS | Qty: 10

## 2014-07-10 MED FILL — NORMAL SALINE FLUSH 0.9 % IV SOLN: 0.9 % | INTRAVENOUS | Qty: 20

## 2014-07-10 MED FILL — CITALOPRAM HYDROBROMIDE 20 MG PO TABS: 20 MG | ORAL | Qty: 1

## 2014-07-10 MED FILL — FUROSEMIDE 10 MG/ML IJ SOLN: 10 MG/ML | INTRAMUSCULAR | Qty: 2

## 2014-07-10 MED FILL — LACTULOSE 10 GM/15ML PO SOLN: 10 GM/15ML | ORAL | Qty: 300

## 2014-07-10 NOTE — Care Coordination-Inpatient (Signed)
Discharge Planning:  Noted consult order for discharge planning for ecf/snf. SW met with patient and his wife. Discussed recommendations. Provided patient's wife with a list of in-network facilities. Awaiting choices. Patient will continue to be followed through discharge.     Andrew [name] A. Gilford Rile, MSW,LSW

## 2014-07-10 NOTE — Progress Notes (Signed)
Progress Note - Dr. Tasia Catchings - Internal Medicine  PCP: Alecia Lemming, DO [place] / [place] [id number] [phone]    Hospital Day: 2  Code Status: Full Code  Current Diet: DIET GENERAL; No Added Salt (3-4 GM)        CC: follow up on medical issues    Subjective:   Andrew [name] is a 74 y.o. male.    He denies problems    Doing ok  MS is a little better    LE doppler shows acute DVT  D/w Dr Al Corpus - will plan to tx  But will have to watch PLT count closely    He denies chest pain, denies shortness of breath, denies nausea,  denies emesis.    10 system Review of Systems is reviewed with patient, and pertinent positives are listed here: None . Otherwise, Review of systems is negative.     I have reviewed the patient's medical and social history in detail and updated the computerized patient record.  To recap: He  has a past medical history of Hypertension; Kidney stones; Alpha 1 antitrypsin deficiency; Thrombocytopenia ([place]); BPH (benign prostatic hyperplasia); Cirrhosis, nonalcoholic ([place]); Chronic systolic CHF (congestive heart failure) ([place]); CAD (coronary artery disease); Compression fracture of lumbar vertebra ([place]); Leukopenia; Anemia; Pneumonia; DVT (deep venous thrombosis) ([place]); Cancer ([place]); and Hyperlipidemia.. He  has past surgical history that includes AV fistula repair (1970's); Colonoscopy (4/12); Cardiac catheterization; eye surgery (04/12/2014); and Cosmetic surgery (04/2013).[name] He  reports that he has never smoked. He has never used smokeless tobacco. He reports that he does not drink alcohol or use illicit drugs..        Active Hospital Problems    Diagnosis Date Noted   ??? DVT (deep venous thrombosis) ([place]) [I82.409] 07/10/2014   ??? Hepatic encephalopathy ([place]) [K72.90] 07/07/2014   ??? Ataxia [R27.0] 07/07/2014   ??? Pancytopenia ([place]) [D61.818] 07/07/2014   ??? Benign essential HTN [I10] 07/03/2014   ??? Hyperlipidemia [E78.5]    ??? Cirrhosis, nonalcoholic ([facility]) [S56.81]        Current  facility-administered medications: enoxaparin (LOVENOX) injection 80 mg, 1 mg/kg, Subcutaneous, BID  rifaximin (XIFAXAN) tablet 550 mg, 550 mg, Oral, BID  magnesium oxide (MAG-OX) tablet 400 mg, 400 mg, Oral, BID  albuterol sulfate HFA 108 (90 BASE) MCG/ACT inhaler 2 puff, 2 puff, Inhalation, Q6H PRN  citalopram (CELEXA) tablet 10 mg, 10 mg, Oral, Daily  furosemide (LASIX) tablet 80 mg, 80 mg, Oral, BID  potassium chloride SA (K-DUR;KLOR-CON M) tablet 40 mEq, 40 mEq, Oral, PRN **OR** potassium chloride 20 MEQ/15ML (10%) oral solution 40 mEq, 40 mEq, Oral, PRN **OR** potassium chloride 10 mEq/100 mL IVPB (Peripheral Line), 10 mEq, Intravenous, PRN  0.9 % sodium chloride infusion, , Intravenous, Continuous  sodium chloride flush 0.9 % injection 10 mL, 10 mL, Intravenous, 2 times per day  sodium chloride flush 0.9 % injection 10 mL, 10 mL, Intravenous, PRN  acetaminophen (TYLENOL) tablet 650 mg, 650 mg, Oral, Q4H PRN  HYDROcodone-acetaminophen (NORCO) 5-325 MG per tablet 1 tablet, 1 tablet, Oral, Q4H PRN **OR** HYDROcodone-acetaminophen (NORCO) 5-325 MG per tablet 2 tablet, 2 tablet, Oral, Q4H PRN  morphine (PF) injection 2 mg, 2 mg, Intravenous, Q2H PRN **OR** morphine (PF) injection 4 mg, 4 mg, Intravenous, Q2H PRN  magnesium hydroxide (MILK OF MAGNESIA) 400 MG/5ML suspension 30 mL, 30 mL, Oral, Daily PRN  ondansetron (ZOFRAN) injection 4 mg, 4 mg, Intravenous, Q6H PRN  famotidine (PEPCID) tablet 20 mg, 20 mg,  Oral, BID PRN  eplerenone (INSPRA) tablet 50 mg, 50 mg, Oral, Daily  lisinopril (PRINIVIL;ZESTRIL) tablet 5 mg, 5 mg, Oral, Nightly  carvedilol (COREG) tablet 3.125 mg, 3.125 mg, Oral, Daily with breakfast **AND** carvedilol (COREG) tablet 6.25 mg, 6.25 mg, Oral, QPM  diclofenac (VOLTAREN) 0.1 % ophthalmic solution 1 drop, 1 drop, Both Eyes, Daily  lactulose (CHRONULAC) 10 GM/15ML solution 20 g, 20 g, Oral, Q4H         Objective:  BP 117/69 mmHg   Pulse 78   Temp(Src) 98.1 ??F (36.7 ??C) (Oral)   Resp 18   Ht 5'  5" (1.651 m)   Wt 178 lb (80.74 kg)   BMI 29.62 kg/m2   SpO2 92%     Patient Vitals for the past 24 hrs:   BP Temp Temp src Pulse Resp SpO2 Weight   07/10/14 0517 117/69 mmHg 98.1 ??F (36.7 ??C) Oral 78 18 92 % 178 lb (80.74 kg)   07/10/14 0054 91/55 mmHg 97.6 ??F (36.4 ??C) Oral 79 16 92 % -   07/09/14 2151 90/49 mmHg 98.4 ??F (36.9 ??C) Oral 68 18 96 % -   07/09/14 1713 124/65 mmHg 97.9 ??F (36.6 ??C) Oral 72 18 92 % -     Patient Vitals for the past 96 hrs (Last 3 readings):   Weight   07/10/14 0517 178 lb (80.74 kg)   07/09/14 0022 175 lb (79.379 kg)   07/07/14 1911 172 lb (78.019 kg)           Intake/Output Summary (Last 24 hours) at 07/10/14 0809  Last data filed at 07/10/14 0602   Gross per 24 hour   Intake   3173 ml   Output    885 ml   Net   2288 ml         Physical Exam:   S1, S2 normal, no murmur, rub or gallop, regular rate and rhythm  clear to auscultation bilaterally  abdomen is soft without significant tenderness, masses, organomegaly or guarding  extremities normal, atraumatic, no cyanosis or edema    Labs:  Lab Results   Component Value Date    WBC 1.9* 07/10/2014    HGB 7.7* 07/10/2014    HCT 23.0* 07/10/2014    PLT 49* 07/10/2014    CHOL 101 04/02/2014    TRIG 83 04/02/2014    HDL 22* 04/02/2014    ALT 20 07/09/2014    AST 35 07/09/2014    NA 145 07/10/2014    K 3.6 07/10/2014    CL 110 07/10/2014    CREATININE 0.8 07/10/2014    BUN 12 07/10/2014    CO2 28 07/10/2014    TSH 2.15 08/15/2013    PSA 0.55 08/15/2013    INR 1.28* 07/07/2014    LABA1C 5.0 01/10/2014    LABMICR Not Indicated 07/08/2014     Lab Results   Component Value Date    TROPONINI <0.01 07/07/2014       Recent Imaging Results are Reviewed:  Ct Head Wo Contrast    07/07/2014   EXAMINATION: CT OF THE HEAD WITHOUT CONTRAST  07/07/2014 11:11 pm  TECHNIQUE: CT of the head was performed without the administration of intravenous contrast.  COMPARISON: 04/01/2014  HISTORY: ORDERING SYSTEM PROVIDED HISTORY: dizzy TECHNOLOGIST PROVIDED HISTORY: Ordering  Physician Provided Reason for Exam: dizzy  FINDINGS: BRAIN/VENTRICLES: There is no acute intracranial hemorrhage, mass effect or midline shift.  No abnormal extra-axial fluid collection.  The gray-white differentiation is maintained without evidence  of an acute infarct.  There is no evidence of hydrocephalus.  ORBITS: The visualized portion of the orbits demonstrate no acute abnormality.  SINUSES: There is minimal mucosal thickening in the left maxillary sinus. The remaining visualized paranasal sinuses are clear.  The mastoid air cells are unremarkable.  SOFT TISSUES/SKULL:  No acute abnormality of the visualized skull or soft tissues.     07/07/2014   IMPRESSION: No acute intracranial abnormality.     Ct Chest W Contrast    06/13/2014   EXAMINATION: CT OF THE CHEST WITH CONTRAST 06/13/2014 3:56 pm  TECHNIQUE: CT of the chest was performed with the administration of intravenous contrast. Multiplanar reformatted images are provided for review.  COMPARISON: None.  HISTORY: Bacterial pneumonia, unspecified  FINDINGS: Mediastinum:  *A lymph node is seen pretracheal upper mediastinum interposed between the proximal left common carotid artery and brachiocephalic artery, image 31. This measures approximately 12 mm.  Subcarinal density possible subcarinal adenopathy, difficult to separate from adjacent fluid. *While there is no significant cardiac enlargement, there is chamber enlargement, left ventricle and left atrium.  This could indicate mitral valve disease.  Coronary calcification is present. *There is mild coronary calcification. *Vasculature is unremarkable for age. Lungs/pleura:  *Large pleural effusion is seen on the right and small effusion on the left. Multifocal patchy areas of airspace disease are seen bilaterally both upper and lower lobes.  Right lower lobe has a somewhat bronchus centric distribution.  There is bronchiectasis bilaterally.  Focal area of consolidation is seen, right upper lobe anteriorly.  *Airways are unremarkable. Soft Tissues/Bones: Bilateral gynecomastia is present.  Small nodular lesion projects from the inferior right thyroid gland.  Sagittal projection demonstrates multiple compression fractures L1 moderate to severe, L2 moderate, T9 moderate and T10 minimal.  Bones are demineralized.  These fractures are faintly noted on prior lateral chest radiograph.  Upper Abdomen: The liver is grossly abnormal, with small overall size, increased density and extensive nodularity throughout the serosal surface. The spleen is mildly enlarged.  There is are extensive unenhanced splenic varices.  Multiple unenhanced gastric and paraesophageal varices are also noted.  Small right renal calculus present     06/13/2014   IMPRESSION: Multifocal airspace disease most likely pneumonia with large pleural effusion primarily on the right.  Chronic bronchiectasis.  Left ventricular and atrial chamber enlargement could indicate mitral valve disease.  Advanced cirrhotic morphology of the liver with portal hypertension and extensive varices, unenhanced on current CT.  Multiple compression fractures essentially unchanged.  Gynecomastia.  Multiple prior moderate compression fractures.     US Abdomen Complete    07/08/2014   EXAMINATION: COMPLETE ABDOMINAL ULTRASOUND 07/08/2014 6:04 pm  COMPARISON: None  HISTORY: ORDERING SYSTEM PROVIDED HISTORY: CIRRHOSIS TECHNOLOGIST PROVIDED HISTORY: Ordering Physician Provided Reason for Exam: cirrhosis, r/o ascites Acuity: Acute Type of Exam: Initial  FINDINGS: Study is limited by body habitus.  LIVER: Evaluation of the liver is limited.  There is diffuse increased echogenicity with no focal lesion noted.  Somewhat nodular contour of the liver is compatible with known cirrhosis.  BILIARY SYSTEM: Limited imaging of the gallbladder is grossly unremarkable.  Common bile duct is within normal limits measuring 6 mm.  KIDNEYS: Right kidney is nonvisualized.  Left kidney measures 10.1 x 4.9 x 5.3 cm  and is grossly unremarkable.  PANCREAS: Pancreas is nonvisualized.  SPLEEN: Spleen measures 10.1 cm.  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  IVC: The IVC is patent.  AORTA: Aorta is obscured.  OTHER: No evidence of ascites.  07/08/2014   IMPRESSION: Limited study as above.  Cirrhosis and portal hypertension.  No evidence of ascites.     Xr Chest Portable    07/08/2014   EXAMINATION: SINGLE VIEW OF THE CHEST  07/07/2014 11:34 pm  COMPARISON: 06/03/2014  HISTORY: ORDERING SYSTEM PROVIDED HISTORY: other TECHNOLOGIST PROVIDED HISTORY: Ordering Physician Provided Reason for Exam: dizziness Acuity: Unknown Type of Exam: Unknown  FINDINGS: Cardiac leads project over the chest.  Diffuse interstitial opacity is demonstrated bilaterally.  Heterogeneous opacity is seen adjacent to the left heart border.  No evidence of pleural effusion.  Negative for pneumothorax. Cardiac and mediastinal silhouettes are similar to prior.  Old posterior left 7th rib fracture.     07/08/2014   IMPRESSION: Mild pulmonary edema.     Vl Extremity Venous Right    07/09/2014   Vascular Lower Extremities DVT Study Procedure  -- PRELIMINARY SONOGRAPHER REPORT --    Demographics    Patient Name      Andrew [name]    Date of Study     07/09/2014          Gender              Male    Patient Number    [id number]          Date of Birth       12-Dec-1940    Visit Number      [id number]         Age                 84 year(s)    Accession Number  [id number]           Room Number         6237    Corporate ID      [id number]            Sonographer         Erasmo Leventhal,                                                            RVS    Ordering          Deniece Portela,   Interpreting        [facility]  Physician         MD                  Physician   Procedure  Type of Study:    Veins:Lower Extremities DVT Study, VL EXTREMITY VENOUS DUPLEX RIGHT.    Generic Orders:VL EXTREMITY VENOUS DUPLEX RIGHT.   Tech Comments Right Acute totally occluding deep vein thrombosis involving  the right proximal PTV and one gastroc vein zone 5-6. Acute partially occluding deep vein thrombosis involving the popliteal vein and right one gastroc vein zone 5-6. No other evidence of deep vein or superficial vein thrombosis involving the right lower extremity and the left common femoral vein. Calf veins were not well visualized on the right due to patient positioning and edema.  Verbal to [employer].    Cta Pulmonary With Contrast    06/27/2014   EXAMINATION: CTA OF THE CHEST 06/27/2014 2:53 pm  TECHNIQUE: CTA of the chest was performed after the administration of intravenous contrast.  Multiplanar reformatted images are provided  for review.  MIP images are provided for review.  Patient received 100 mL Isovue 370 intravenous contrast.  COMPARISON: CT chest with contrast June 13, 2014.  HISTORY: ORDERING SYSTEM PROVIDED HISTORY: DVT (deep venous thrombosis), unspecified laterality [facility]) TECHNOLOGIST PROVIDED HISTORY: Ordering Physician Provided Reason for Exam: DVT, SOB Injury/Trauma or Illness: Illness/Other Acuity: Acute Type of Exam: Initial Relevant Medica/Surgical History: CHF, cardiomyopathy  FINDINGS: Pulmonary Arteries: Pulmonary arteries are adequately opacified for evaluation.  No evidence of intraluminal filling defect to suggest pulmonary embolism.  Main pulmonary artery is normal in caliber.  Mediastinum: No evidence of mediastinal lymphadenopathy.  Heart is enlarged, similar in appearance.  The heart and pericardium demonstrate no acute abnormality.  There is no acute abnormality of the thoracic aorta. Nodularity of thyroid similar in appearance.  Lungs/pleura: The central airways are patent.  There is a moderate right-sided pleural effusion which appears slightly decreased compared to prior study.  There is mild patchy dependent opacities in the right upper lobe, similar in appearance.  There are dependent subpleural opacities within the lingula and left lower lobe.  There is mild opacity within the  right middle lobe which is linear in appearance in appears slightly improved compared to prior study.  2 nodular appearing densities are again seen in the right lower lobe on image number 65, unchanged.  No pneumothorax.  Upper Abdomen: Cirrhotic liver with mildly distended gallbladder is similar in appearance to prior study.  Soft Tissues/Bones: Multiple compression fractures again seen, most pronounced at the thoracolumbar junction, similar in appearance.  Old right-sided rib fractures are also seen.  No acute bone or soft tissue abnormality.  Gynecomastia is again seen.     06/27/2014   IMPRESSION: No evidence of pulmonary embolism.  Moderate right pleural effusion, minimally decreased.  Persistent patchy areas of airspace opacity which may be related to atelectasis versus pneumonia, slightly improved.  2 nodular appearing densities are seen within the right lower lobe which may be related to the airspace disease or represent lung nodules.  Follow-up recommended with dedicated chest CT in 3 months.  Remote compression fractures again seen.  Cirrhotic liver.     Vl Extremity Venous Bilateral    07/09/2014   Vascular Lower Extremities DVT Study Procedure  -- PRELIMINARY SONOGRAPHER REPORT --    Demographics    Patient Name      Andrew [name]    Date of Study     07/09/2014          Gender              Male    Patient Number    [id number]          Date of Birth       08/10/1940    Visit Number      [id number]         Age                 42 year(s)    Accession Number  [id number]           Room Number         6433    Corporate ID      [id number]            Sonographer         Erasmo Leventhal,  RVS    Ordering          Deniece Portela,   Interpreting        [facility]  Physician         MD                  Physician   Procedure  Type of Study:    Veins:Lower Extremities DVT Study, VL EXTREMITY VENOUS DUPLEX RIGHT.   Tech Comments Right Acute totally occluding  deep vein thrombosis involving the right proximal PTV and one gastroc vein zone 5-6. Acute partially occluding deep vein thrombosis involving the popliteal vein and right one gastroc vein zone 5-6. No other evidence of deep vein or superficial vein thrombosis involving the right lower extremity and the left common femoral vein. Calf veins were not well visualized on the right due to patient positioning and edema.  verbal to [employer].    Vl Extremity Venous Bilateral    06/27/2014   Lower Extremities DVT Study   Demographics    Patient Name       Andrew [name]    Date of Study      06/27/2014        Gender              Male    Patient Number     [id number]        Date of Birth       Jul 24, 1940    Visit Number       [id number]       Age                 64 year(s)    Accession Number   [id number]         Room Number    Corporate ID       [id number]          Sonographer         Erasmo Leventhal,                                                           RVS    Ordering Physician Lacretia Leigh, MD  Interpreting        [facility]                                       Physician           Darlyn Chamber MD,                                                           [facility]   Procedure  Type of Study:    Veins:Lower Extremities DVT Study, VASC EXTREMITY VENOUS DUPLEX BILATERAL.    Vascular Sonographer Report   Additional Indications:Swelliing  Impressions Right Impression Acute partially occluding deep vein thrombosis involving the right set of gastroc veins zone 5-6. No other evidence of deep vein or superficial vein thrombosis involving the right lower extremity. Reflux noted in the right FV. Left Impression No  evidence of deep vein or superficial vein thrombosis involving the left lower extremity.  Verbal to Dr. Jeb Levering. Patient released.  Conclusions    Summary    -Acute partially occluding deep vein thrombosis involving the right set of  gastroc veins zone 5-6.  -Reflux noted in the right FV.    Signature     ------------------------------------------------------------------  Electronically signed by Darlyn Chamber MD, [facility] (Interpreting  physician) on 06/27/2014 at 01:38 PM  ------------------------------------------------------------------   Patient Status:Routine. Day - Vascular Lab. Technical Quality:Adequate visualization.  Velocities are measured in cm/s ; Diameters are measured in mm  Right Lower Extremities DVT Study Measurements Right 2D Measurements +------------------------+----------+---------------+----------+ !Location                !Visualized!Compressibility!Thrombosis! +------------------------+----------+---------------+----------+ !Sapheno Femoral Junction!Yes       !Yes            !None      ! +------------------------+----------+---------------+----------+ !GSV Thigh               !Yes       !Yes            !None      ! +------------------------+----------+---------------+----------+ !Common Femoral          !Yes       !Yes            !None      ! +------------------------+----------+---------------+----------+ !Prox Femoral            !Yes       !Yes            !None      ! +------------------------+----------+---------------+----------+ !Mid Femoral             !Yes       !Yes            !None      ! +------------------------+----------+---------------+----------+ !Dist Femoral            !Yes       !Yes            !None      ! +------------------------+----------+---------------+----------+ !Deep Femoral            !Yes       !Yes            !None      ! +------------------------+----------+---------------+----------+ !Popliteal               !Yes       !Yes            !None      ! +------------------------+----------+---------------+----------+ !GSV Below Knee          !Yes       !Yes            !None      ! +------------------------+----------+---------------+----------+ !Gastroc                 !Yes       !Partial        !Acute     !  +------------------------+----------+---------------+----------+ !PTV                     !Yes       !Yes            !None      ! +------------------------+----------+---------------+----------+ !Peroneal                !Yes       !Yes            !  None      ! +------------------------+----------+---------------+----------+ !SSV                     !Yes       !Yes            !None      ! +------------------------+----------+---------------+----------+  Right Doppler Measurements +------------------------+------+------+------------+ !Location                !Signal!Reflux!Reflux (sec)! +------------------------+------+------+------------+ !Sapheno Femoral Junction!Phasic!No    !            ! +------------------------+------+------+------------+ !Common Femoral          !Phasic!No    !            ! +------------------------+------+------+------------+ !Femoral                 !Phasic!Yes   !            ! +------------------------+------+------+------------+ !Deep Femoral            !Phasic!No    !            ! +------------------------+------+------+------------+ !Popliteal               !Phasic!No    !            ! +------------------------+------+------+------------+  Left Lower Extremities DVT Study Measurements Left 2D Measurements +------------------------+----------+---------------+----------+ !Location                !Visualized!Compressibility!Thrombosis! +------------------------+----------+---------------+----------+ !Sapheno Femoral Junction!Yes       !Yes            !None      ! +------------------------+----------+---------------+----------+ !GSV Thigh               !Yes       !Yes            !None      ! +------------------------+----------+---------------+----------+ !Common Femoral          !Yes       !Yes            !None      ! +------------------------+----------+---------------+----------+ !Prox Femoral            !Yes       !Yes            !None      !  +------------------------+----------+---------------+----------+ !Mid Femoral             !Yes       !Yes            !None      ! +------------------------+----------+---------------+----------+ !Dist Femoral            !Yes       !Yes            !None      ! +------------------------+----------+---------------+----------+ !Deep Femoral            !Yes       !Yes            !None      ! +------------------------+----------+---------------+----------+ !Popliteal               !Yes       !Yes            !None      ! +------------------------+----------+---------------+----------+ !GSV Below Knee          !Yes       !Yes            !  None      ! +------------------------+----------+---------------+----------+ !Gastroc                 !Yes       !Yes            !None      ! +------------------------+----------+---------------+----------+ !PTV                     !Yes       !Yes            !None      ! +------------------------+----------+---------------+----------+ !Peroneal                !Yes       !Yes            !None      ! +------------------------+----------+---------------+----------+ !SSV                     !Yes       !Yes            !None      ! +------------------------+----------+---------------+----------+  Left Doppler Measurements +------------------------+------+------+------------+ !Location                !Signal!Reflux!Reflux (sec)! +------------------------+------+------+------------+ !Sapheno Femoral Junction!Phasic!No    !            ! +------------------------+------+------+------------+ !Common Femoral          !Phasic!No    !            ! +------------------------+------+------+------------+ !Femoral                 !Phasic!No    !            ! +------------------------+------+------+------------+ !Deep Femoral            !Phasic!No    !            ! +------------------------+------+------+------------+ !Popliteal               !Phasic!No    !            !  +------------------------+------+------+------------+      Assessment and Plan:  Patient Active Hospital Problem List:   Hepatic encephalopathy ([place]) (07/07/2014)    Assessment: doing better    Plan: cont xifaxan, lactulose   Cirrhosis, nonalcoholic ([place]) ()    Assessment: Stable    Plan: Continue present orders/plan   Hyperlipidemia ()    Assessment: Stable    Plan: Continue present orders/plan   Benign essential HTN (07/03/2014)    Assessment: Stable    Plan: Continue present orders/plan   Pancytopenia ([place]) (07/07/2014)    Assessment: counts still low, bmbx yest    Plan: await results   DVT (deep venous thrombosis) ([place]) (07/10/2014)    Assessment: acute dvt seen    Plan: d/w dr Al Corpus;. Will tx 1mg /kg lovenox. Need to keep close eye on plt    Disp - ARU unlikely per Dr Ova Freshwater note. Will try to pursue ECF. THurs/Fri discharge possible              Deniece Portela  07/10/2014

## 2014-07-10 NOTE — Plan of Care (Signed)
Problem: Falls - Risk of  Goal: Absence of falls  Outcome: Ongoing  Pt in bed with bed alarm on, instructed to call for assistance. Verbalized understanding. All fall precautions in place.         Problem: Risk for Impaired Skin Integrity  Goal: Tissue integrity - skin and mucous membranes  Structural intactness and normal physiological function of skin and  mucous membranes.   Outcome: Ongoing  Pt to remain free of skin breakdown during stay, pt encouraged to turn and reposition frequently. No evidence of skin breakdown noted.

## 2014-07-10 NOTE — Progress Notes (Signed)
[facility] FOLLOW-UP:     Primary Oncologist: Dr. Leola Brazil  Date: 07/10/2014    PCP: Debbe Bales, DO  Referring Provider: No ref. provider found    PROBLEM LIST:     Patient Active Problem List   Diagnosis   ??? Erectile dysfunction   ??? Thrombocytopenia   ??? Edema   ??? BPH (benign prostatic hyperplasia)   ??? Alpha-1-antitrypsin deficiency   ??? Cirrhosis, nonalcoholic (HCC)   ??? Mild CAD   ??? Chronic systolic CHF (congestive heart failure) (HCC)   ??? Non-ischemic cardiomyopathy (HCC)   ??? Lumbar compression fracture (HCC)   ??? Chronic low back pain   ??? Osteopenia   ??? Coagulopathy (HCC)   ??? Abnormal EKG   ??? Visual changes   ??? Fixed pupil of right eye   ??? Anemia   ??? Hemispheric carotid artery syndrome   ??? CAD in native artery   ??? Hyperlipidemia   ??? Idiopathic cardiomyopathy (HCC)   ??? Bronchiectasis without complication (HCC)   ??? Benign essential HTN   ??? Hepatic encephalopathy (HCC)   ??? Ataxia   ??? Pancytopenia (HCC)   ??? DVT (deep venous thrombosis) [facility])       Specialty Problems     None          INTERVAL HISTORY     Feeling reasonably well.  Weak.     REVIEW OF SYSTEMS:     Review of Systems - General ROS: positive for  - fatigue  negative for - chills or fever  Respiratory ROS: no cough, shortness of breath, or wheezing  Cardiovascular ROS: no chest pain or dyspnea on exertion  Gastrointestinal ROS: no abdominal pain, change in bowel habits, or black or bloody stools  Genito-Urinary ROS: no dysuria, trouble voiding, or hematuria    PHYSICAL EXAM:     BP 116/74 mmHg   Pulse 79   Temp(Src) 98.1 ??F (36.7 ??C) (Axillary)   Resp 18   Ht 5\' 5"  (1.651 m)   Wt 178 lb (80.74 kg)   BMI 29.62 kg/m2   SpO2 94%    WEIGHT:   Wt Readings from Last 3 Encounters:   07/10/14 178 lb (80.74 kg)   07/03/14 180 lb (81.647 kg)   06/26/14 185 lb (83.915 kg)     GENERAL APPEARANCE: alert and cooperative  HEAD: Normocephalic, without obvious abnormality, atraumatic  NECK: No palpable lymphadenopathy in supraclavicular or cervical chains  LUNGS: Clear to  auscultation bilaterally, no audible rales, wheezes or crackles  HEART: Regular rate and rhythm, S1, S2 normal  ABDOMEN: Soft, non-tender; bowel sounds normal; no masses, no organomegaly  EXTREMITIES: without cyanosis, clubbing, or asymmetry. Leg edema.  SKIN: No jaundice, purpura or petechiae    LABS:     CBC:  Lab Results   Component Value Date    WBC 1.9* 07/10/2014    HGB 7.7* 07/10/2014    HCT 23.0* 07/10/2014    MCV 119.5* 07/10/2014    PLT 49* 07/10/2014    LYMPHOPCT 61.9 07/10/2014    RBC 1.92* 07/10/2014    MCH 40.1* 07/10/2014    MCHC 33.5 07/10/2014    RDW 18.8* 07/10/2014       Lab Results   Component Value Date    NA 145 07/10/2014    K 3.6 07/10/2014    CL 110 07/10/2014    CO2 28 07/10/2014    BUN 12 07/10/2014    CREATININE 0.8 07/10/2014    GLUCOSE 100* 07/10/2014  CALCIUM 7.9* 07/10/2014    PROT 5.5* 07/09/2014    LABALBU 1.9* 07/09/2014    BILITOT 1.1* 07/09/2014    ALKPHOS 129 07/09/2014    AST 35 07/09/2014    ALT 20 07/09/2014    LABGLOM >60 07/10/2014    GFRAA >60 07/10/2014    AGRATIO 0.6* 05/03/2014    GLOB 3.9 05/03/2014       No results found for: PTINR    TUMOR MARKERS:    Lab Results   Component Value Date    PSA 0.55 08/15/2013         IMAGING:     Ct Head Wo Contrast    07/07/2014   EXAMINATION: CT OF THE HEAD WITHOUT CONTRAST  07/07/2014 11:11 pm  TECHNIQUE: CT of the head was performed without the administration of intravenous contrast.  COMPARISON: 04/01/2014  HISTORY: ORDERING SYSTEM PROVIDED HISTORY: dizzy TECHNOLOGIST PROVIDED HISTORY: Ordering Physician Provided Reason for Exam: dizzy  FINDINGS: BRAIN/VENTRICLES: There is no acute intracranial hemorrhage, mass effect or midline shift.  No abnormal extra-axial fluid collection.  The gray-white differentiation is maintained without evidence of an acute infarct.  There is no evidence of hydrocephalus.  ORBITS: The visualized portion of the orbits demonstrate no acute abnormality.  SINUSES: There is minimal mucosal thickening in the  left maxillary sinus. The remaining visualized paranasal sinuses are clear.  The mastoid air cells are unremarkable.  SOFT TISSUES/SKULL:  No acute abnormality of the visualized skull or soft tissues.     07/07/2014   IMPRESSION: No acute intracranial abnormality.     Ct Chest W Contrast    06/13/2014   EXAMINATION: CT OF THE CHEST WITH CONTRAST 06/13/2014 3:56 pm  TECHNIQUE: CT of the chest was performed with the administration of intravenous contrast. Multiplanar reformatted images are provided for review.  COMPARISON: None.  HISTORY: Bacterial pneumonia, unspecified  FINDINGS: Mediastinum:  *A lymph node is seen pretracheal upper mediastinum interposed between the proximal left common carotid artery and brachiocephalic artery, image 31. This measures approximately 12 mm.  Subcarinal density possible subcarinal adenopathy, difficult to separate from adjacent fluid. *While there is no significant cardiac enlargement, there is chamber enlargement, left ventricle and left atrium.  This could indicate mitral valve disease.  Coronary calcification is present. *There is mild coronary calcification. *Vasculature is unremarkable for age. Lungs/pleura:  *Large pleural effusion is seen on the right and small effusion on the left. Multifocal patchy areas of airspace disease are seen bilaterally both upper and lower lobes.  Right lower lobe has a somewhat bronchus centric distribution.  There is bronchiectasis bilaterally.  Focal area of consolidation is seen, right upper lobe anteriorly. *Airways are unremarkable. Soft Tissues/Bones: Bilateral gynecomastia is present.  Small nodular lesion projects from the inferior right thyroid gland.  Sagittal projection demonstrates multiple compression fractures L1 moderate to severe, L2 moderate, T9 moderate and T10 minimal.  Bones are demineralized.  These fractures are faintly noted on prior lateral chest radiograph.  Upper Abdomen: The liver is grossly abnormal, with small overall size,  increased density and extensive nodularity throughout the serosal surface. The spleen is mildly enlarged.  There is are extensive unenhanced splenic varices.  Multiple unenhanced gastric and paraesophageal varices are also noted.  Small right renal calculus present     06/13/2014   IMPRESSION: Multifocal airspace disease most likely pneumonia with large pleural effusion primarily on the right.  Chronic bronchiectasis.  Left ventricular and atrial chamber enlargement could indicate mitral valve disease.  Advanced cirrhotic  morphology of the liver with portal hypertension and extensive varices, unenhanced on current CT.  Multiple compression fractures essentially unchanged.  Gynecomastia.  Multiple prior moderate compression fractures.     US Abdomen Complete    07/08/2014   EXAMINATION: COMPLETE ABDOMINAL ULTRASOUND 07/08/2014 6:04 pm  COMPARISON: None  HISTORY: ORDERING SYSTEM PROVIDED HISTORY: CIRRHOSIS TECHNOLOGIST PROVIDED HISTORY: Ordering Physician Provided Reason for Exam: cirrhosis, r/o ascites Acuity: Acute Type of Exam: Initial  FINDINGS: Study is limited by body habitus.  LIVER: Evaluation of the liver is limited.  There is diffuse increased echogenicity with no focal lesion noted.  Somewhat nodular contour of the liver is compatible with known cirrhosis.  BILIARY SYSTEM: Limited imaging of the gallbladder is grossly unremarkable.  Common bile duct is within normal limits measuring 6 mm.  KIDNEYS: Right kidney is nonvisualized.  Left kidney measures 10.1 x 4.9 x 5.3 cm and is grossly unremarkable.  PANCREAS: Pancreas is nonvisualized.  SPLEEN: Spleen measures 10.1 cm.  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  IVC: The IVC is patent.  AORTA: Aorta is obscured.  OTHER: No evidence of ascites.     07/08/2014   IMPRESSION: Limited study as above.  Cirrhosis and portal hypertension.  No evidence of ascites.     Xr Chest Portable    07/08/2014   EXAMINATION: SINGLE VIEW OF THE CHEST  07/07/2014 11:34 pm  COMPARISON: 06/03/2014   HISTORY: ORDERING SYSTEM PROVIDED HISTORY: other TECHNOLOGIST PROVIDED HISTORY: Ordering Physician Provided Reason for Exam: dizziness Acuity: Unknown Type of Exam: Unknown  FINDINGS: Cardiac leads project over the chest.  Diffuse interstitial opacity is demonstrated bilaterally.  Heterogeneous opacity is seen adjacent to the left heart border.  No evidence of pleural effusion.  Negative for pneumothorax. Cardiac and mediastinal silhouettes are similar to prior.  Old posterior left 7th rib fracture.     07/08/2014   IMPRESSION: Mild pulmonary edema.     Vl Extremity Venous Right    07/09/2014   Vascular Lower Extremities DVT Study Procedure  -- PRELIMINARY SONOGRAPHER REPORT --    Demographics    Patient Name      Andrew [name]    Date of Study     07/09/2014          Gender              Male    Patient Number    [id number]          Date of Birth       Jan 02, 1941    Visit Number      [id number]         Age                 8 year(s)    Accession Number  [id number]           Room Number         2440    Corporate ID      [id number]            [place], [place],                                                            [place]    Ordering  Deniece Portela,   Interpreting        [facility]  Physician         MD                  Physician   Procedure  Type of Study:    Veins:Lower Extremities DVT Study, VL EXTREMITY VENOUS DUPLEX RIGHT.    Generic Orders:VL EXTREMITY VENOUS DUPLEX RIGHT.   Tech Comments Right Acute totally occluding deep vein thrombosis involving the right proximal PTV and one gastroc vein zone 5-6. Acute partially occluding deep vein thrombosis involving the popliteal vein and right one gastroc vein zone 5-6. No other evidence of deep vein or superficial vein thrombosis involving the right lower extremity and the left common femoral vein. Calf veins were not well visualized on the right due to patient positioning and edema.  Verbal to [employer].    Cta Pulmonary With  Contrast    06/27/2014   EXAMINATION: CTA OF THE CHEST 06/27/2014 2:53 pm  TECHNIQUE: CTA of the chest was performed after the administration of intravenous contrast.  Multiplanar reformatted images are provided for review.  MIP images are provided for review.  Patient received 100 mL Isovue 370 intravenous contrast.  COMPARISON: CT chest with contrast June 13, 2014.  HISTORY: ORDERING SYSTEM PROVIDED HISTORY: DVT (deep venous thrombosis), unspecified laterality [facility]) TECHNOLOGIST PROVIDED HISTORY: Ordering Physician Provided Reason for Exam: DVT, SOB Injury/Trauma or Illness: Illness/Other Acuity: Acute Type of Exam: Initial Relevant Medica/Surgical History: CHF, cardiomyopathy  FINDINGS: Pulmonary Arteries: Pulmonary arteries are adequately opacified for evaluation.  No evidence of intraluminal filling defect to suggest pulmonary embolism.  Main pulmonary artery is normal in caliber.  Mediastinum: No evidence of mediastinal lymphadenopathy.  Heart is enlarged, similar in appearance.  The heart and pericardium demonstrate no acute abnormality.  There is no acute abnormality of the thoracic aorta. Nodularity of thyroid similar in appearance.  Lungs/pleura: The central airways are patent.  There is a moderate right-sided pleural effusion which appears slightly decreased compared to prior study.  There is mild patchy dependent opacities in the right upper lobe, similar in appearance.  There are dependent subpleural opacities within the lingula and left lower lobe.  There is mild opacity within the right middle lobe which is linear in appearance in appears slightly improved compared to prior study.  2 nodular appearing densities are again seen in the right lower lobe on image number 65, unchanged.  No pneumothorax.  Upper Abdomen: Cirrhotic liver with mildly distended gallbladder is similar in appearance to prior study.  Soft Tissues/Bones: Multiple compression fractures again seen, most pronounced at the thoracolumbar  junction, similar in appearance.  Old right-sided rib fractures are also seen.  No acute bone or soft tissue abnormality.  Gynecomastia is again seen.     06/27/2014   IMPRESSION: No evidence of pulmonary embolism.  Moderate right pleural effusion, minimally decreased.  Persistent patchy areas of airspace opacity which may be related to atelectasis versus pneumonia, slightly improved.  2 nodular appearing densities are seen within the right lower lobe which may be related to the airspace disease or represent lung nodules.  Follow-up recommended with dedicated chest CT in 3 months.  Remote compression fractures again seen.  Cirrhotic liver.     Vl Extremity Venous Bilateral    07/09/2014   Vascular Lower Extremities DVT Study Procedure  -- PRELIMINARY SONOGRAPHER REPORT --    Demographics    Patient Name      Andrew  [name]    Date of Study     07/09/2014          Gender              Male    Patient Number    [id number]          Date of Birth       1940-06-25    Visit Number      [id number]         Age                 78 year(s)    Accession Number  [id number]           Room Number         5784    Corporate ID      [id number]            Sonographer         Erasmo Leventhal,                                                            [facility]    Ordering          Deniece Portela,   Interpreting        [facility]  Physician         MD                  Physician   Procedure  Type of Study:    Veins:Lower Extremities DVT Study, VL EXTREMITY VENOUS DUPLEX RIGHT.   Tech Comments Right Acute totally occluding deep vein thrombosis involving the right proximal PTV and one gastroc vein zone 5-6. Acute partially occluding deep vein thrombosis involving the popliteal vein and right one gastroc vein zone 5-6. No other evidence of deep vein or superficial vein thrombosis involving the right lower extremity and the left common femoral vein. Calf veins were not well visualized on the right due to patient positioning and edema.  verbal to  [employer].    Vl Extremity Venous Bilateral    06/27/2014   Lower Extremities DVT Study   Demographics    Patient Name       Andrew [name]    Date of Study      06/27/2014        Gender              Male    Patient Number     [id number]        Date of Birth       18-Aug-1940    Visit Number       [id number]       Age                 32 year(s)    Accession Number   [id number]         Room Number    Corporate ID       [id number]          Sonographer         Erasmo Leventhal,  [facility]    Ordering Physician Lacretia Leigh, MD  Interpreting        [facility]                                       Physician           Darlyn Chamber MD,                                                           [facility]   Procedure  Type of Study:    Veins:Lower Extremities DVT Study, VASC EXTREMITY VENOUS DUPLEX BILATERAL.    Vascular Sonographer Report   Additional Indications:Swelliing  Impressions Right Impression Acute partially occluding deep vein thrombosis involving the right set of gastroc veins zone 5-6. No other evidence of deep vein or superficial vein thrombosis involving the right lower extremity. Reflux noted in the right FV. Left Impression No evidence of deep vein or superficial vein thrombosis involving the left lower extremity.  Verbal to Dr. Jeb Levering. Patient released.  Conclusions    Summary    -Acute partially occluding deep vein thrombosis involving the right set of  gastroc veins zone 5-6.  -Reflux noted in the right FV.    Signature    ------------------------------------------------------------------  Electronically signed by Darlyn Chamber MD, [facility] (Interpreting  physician) on 06/27/2014 at 01:38 PM  ------------------------------------------------------------------   Patient Status:Routine. [place] - Vascular Lab. Technical Quality:Adequate visualization.  Velocities are measured in cm/s ; Diameters are measured in mm  Right Lower  Extremities DVT Study Measurements Right 2D Measurements    STAGING:     No matching staging information was found for the patient.    ASSESSMENT AND PLAN:     1. Right LE DVT.  2. 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   Platelets around 50K.  Will watch counts carefully.   3. Macrocytosis.     Await bone marrow biopsy results.  Will give 2 units of RBC transfusion because of his Hb down to 7.7 and he will be kept on full anticoagulation for now.  Will consider Lovenox therapeutic dose bid for 2 weeks and will transition to coumadin afterward.  Would like to keep INR around 2 at that point.  Patient will follow up with Dr. Mariella Saa.   Discussed with Dr. Tasia Catchings.  Explained to the patient and the family.    Eulis Canner, M.D.; M.S.  Medical Oncology/Hematology  Phone: [phone]  Fax: [phone]    [facility]  [address] [place] [address] [place] [address]    [place] [address]    [place] [address]

## 2014-07-10 NOTE — Progress Notes (Signed)
Pt resting comfortably in bed with family at bedside. Pt tolerating blood well. VSS. Call light within reach. Bed alarm activated. Denies further needs at this time. Will continue to monitor. Electronically signed by Regis Bill, RN on 07/10/2014 at 2:13 PM

## 2014-07-10 NOTE — Progress Notes (Signed)
Pt up in chair resting with wife at bedside. First unit of blood started. Tolerating well. VSS. Will continue to monitor. Electronically signed by Regis Bill, RN on 07/10/2014 at 12:30 PM

## 2014-07-10 NOTE — Progress Notes (Signed)
Pt had one episode of hematuria. Will continue to monitor. Electronically signed by Regis Bill, RN on 07/10/2014 at 6:20 PM

## 2014-07-10 NOTE — Progress Notes (Signed)
Pt incontinent. Used urinal also.  pericare complete. New brief. Bed alarm engaged, call light within reach  Wife at bedside

## 2014-07-10 NOTE — Progress Notes (Signed)
Bedside handoff completed with  Romelle Starcher RN and Daiva Huge. Pt resting in bed respirations even and easy. Call light in reach. Fall precautions in place. No needs expressed at this time.

## 2014-07-10 NOTE — Progress Notes (Signed)
Physical Therapy/Occupational Therapy      Attempted to see pt for PT/OT tx this date. Pt with positive for RLE DVT on 07/08/13. Nursing reporting pt just started anticoagulation therapy this date. Will hold this date and follow up per PT/OT POC as medically appropriate.     Thank you. Genene Churn PT, DPT [phone], Mardelle Matte OTR/L [phone]

## 2014-07-10 NOTE — Progress Notes (Addendum)
[facility]  Gastroenterology Progress Note    Andrew [name] is a 74 y.o. male patient.  Principal Problem:    Hepatic encephalopathy (HCC)  Active Problems:    Cirrhosis, nonalcoholic (HCC)    Hyperlipidemia    Benign essential HTN    Ataxia    Pancytopenia (HCC)    DVT (deep venous thrombosis) (HCC)      SUBJECTIVE:   He had 2 BMs yesterday. One BM today so far.  He is oriented but still drowsy.     ROS:   No fever, chills  No chest pain, palpitations  No SOB, cough    Physical    VITALS:  BP 116/74 mmHg   Pulse 79   Temp(Src) 98.1 ??F (36.7 ??C) (Axillary)   Resp 18   Ht 5\' 5"  (1.651 m)   Wt 178 lb (80.74 kg)   BMI 29.62 kg/m2   SpO2 94%  TEMPERATURE:  Current - Temp: 98.1 ??F (36.7 ??C); Max - Temp  Avg: 98 ??F (36.7 ??C)  Min: 97.6 ??F (36.4 ??C)  Max: 98.4 ??F (36.9 ??C)    NAD   Regular rate   Lungs CTA Bilaterally  Abdomen soft, ND, NT,  Bowel sounds normal.  No asterixis, A&O    Data    Data Review:    Recent Labs      07/08/14   0441  07/08/14   0917  07/09/14   0531  07/10/14   0526   WBC  2.0*   --   1.8*  1.9*   HGB  7.7*   --   7.6*  7.7*   HCT  23.0*  24.7*  22.8*  23.0*   MCV  120.4*   --   120.9*  119.5*   PLT  47*   --   47*  49*     Recent Labs      07/08/14   0441  07/09/14   0532  07/10/14   0527   NA  136  142  145   K  4.0  3.9  3.6   CL  102  108  110   CO2  28  29  28    BUN  20  16  12    CREATININE  0.8  0.8  0.8     Recent Labs      07/07/14   1924  07/09/14   0532   AST  43*  35   ALT  20  20   BILIDIR  0.4*  0.4*   BILITOT  1.2*  1.1*   ALKPHOS  129  129     No results for input(s): LIPASE, AMYLASE in the last 72 hours.  Recent Labs      07/07/14   1924   PROTIME  14.7*   INR  1.28*     No results for input(s): PTT in the last 72 hours.    Abdominal US: 07/08/14  IMPRESSION:   Limited study as above.      Cirrhosis and portal hypertension.      No evidence of ascites.         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 Followed by Dr. Einar Grad. No signs of GI bleeding. No ascites on [place].   Hepatic  encephalopathy - No GI bleeding or AKI which can precipitate HE. No PNA on CXR, although CXR c/w pulmonary edema. UA trace leuk esterase but urine cx with < 25,000 Enterococcus. No ascites. Suspect encephalopathy  was from taking less lactulose at home.  No asterixis today but he is drowsy.   Macrocytic anemia - no signs of GI bleed. Negative EGD and colonoscopy Nov 2015. No iron, folate, or B12 deficiency back in Nov. Heme following. S/p BMBx.  Pancytopenia - heme following.   Acute DVT - dopplers with completely occluding RLE DVT ( was paritally occlusive in April) and acute DVT RLE. Started on lovenox.     PLAN :  - continue Xifaxan BID  - continue lactulose q4 hours until having >3 BMs. Then can titrate down of a goal of 3 BMs daily.   - 2 U PRBC ordered per heme    Discussed with Dr. Cottie Banda, PA-C  [place]  I have personally performed a face to face diagnostic evaluation on this patient.  I have interviewed and examined the patient and I agree with the findings and recommended plan of care.  In summary, my findings and plan are the following: Still slow mentation and asterixis still present on exam.  Will continue po lactulose and add lactulose enema this pm.  Will reassess in AM.    Norwood Levo, MD  [facility]  07/10/2014

## 2014-07-10 NOTE — Progress Notes (Signed)
Pt finished first unit of blood. Tolerated well. Lasix given and second unit started. Will continue to monitor. Electronically signed by Regis Bill, RN on 07/10/2014 at 3:06 PM

## 2014-07-10 NOTE — Progress Notes (Signed)
Pt received 2/3 of a lactulose enema. Tolerated well. Had a BM. Voided per urinal with no hematuria. Pt resting comfortably in bed with bed alarm activated. Call light within reach. Denies further needs at this time. Will continue to monitor. Electronically signed by Regis Bill, RN on 07/10/2014 at 6:19 PM

## 2014-07-10 NOTE — Progress Notes (Signed)
AM meds given. Assessment completed. VSS. Pt resting comfortably in bed with family at bedside. Call light within reach. Bed alarm activated. Denies further needs at this time. Will continue to monitor. Electronically signed by Regis Bill, RN on 07/10/2014 at 8:39 AM

## 2014-07-10 NOTE — Progress Notes (Signed)
Assessment complete. VSS. Patient resting in bed. Respirations even and easy. Call light in reach. Fall precautions in place. No needs expressed at this time. Will continue to monitor.

## 2014-07-10 NOTE — Progress Notes (Signed)
IV leaking at site. New piv started.

## 2014-07-11 LAB — BASIC METABOLIC PANEL
Anion Gap: 6 (ref 3–16)
BUN: 12 mg/dL (ref 7–20)
CO2: 29 mmol/L (ref 21–32)
Calcium: 8.1 mg/dL — ABNORMAL LOW (ref 8.3–10.6)
Chloride: 108 mmol/L (ref 99–110)
Creatinine: 0.5 mg/dL — ABNORMAL LOW (ref 0.8–1.3)
GFR African American: >60 (ref >60)
GFR Non-African American: >60 (ref >60)
Glucose: 103 mg/dL — ABNORMAL HIGH (ref 70–99)
Potassium: 3.6 mmol/L (ref 3.5–5.1)
Sodium: 143 mmol/L (ref 136–145)

## 2014-07-11 LAB — CBC WITH AUTO DIFFERENTIAL
Bands Relative: 5 % (ref 0–7)
Basophils %: 0 %
Basophils Absolute: 0 10*3/uL (ref 0.0–0.2)
Eosinophils %: 0 %
Eosinophils Absolute: 0 10*3/uL (ref 0.0–0.6)
Hematocrit: 26.1 % — ABNORMAL LOW (ref 40.5–52.5)
Hemoglobin: 8.9 g/dL — ABNORMAL LOW (ref 13.5–17.5)
Lymphocytes %: 25 %
Lymphocytes Absolute: 0.6 10*3/uL — ABNORMAL LOW (ref 1.0–5.1)
MCH: 37.3 pg — ABNORMAL HIGH (ref 26.0–34.0)
MCHC: 34.2 g/dL (ref 31.0–36.0)
MCV: 109.1 fL — ABNORMAL HIGH (ref 80.0–100.0)
MPV: 8.3 fL (ref 5.0–10.5)
Monocytes %: 1 %
Monocytes Absolute: 0 10*3/uL (ref 0.0–1.3)
Neutrophils %: 69 %
Neutrophils Absolute: 1.9 10*3/uL (ref 1.7–7.7)
PLATELET SLIDE REVIEW: DECREASED
Platelets: 47 10*3/uL — ABNORMAL LOW (ref 135–450)
RBC: 2.39 M/uL — ABNORMAL LOW (ref 4.20–5.90)
RDW: 26.7 % — ABNORMAL HIGH (ref 12.4–15.4)
WBC: 2.5 10*3/uL — ABNORMAL LOW (ref 4.0–11.0)

## 2014-07-11 LAB — AMMONIA: Ammonia: 47 umol/L (ref 16–60)

## 2014-07-11 MED FILL — LACTULOSE 10 GM/15ML PO SOLN: 10 GM/15ML | ORAL | Qty: 30

## 2014-07-11 MED FILL — LOVENOX 80 MG/0.8ML SC SOLN: 80 MG/0.8ML | SUBCUTANEOUS | Qty: 0.8

## 2014-07-11 MED FILL — SODIUM CHLORIDE 0.9 % IV SOLN: 0.9 % | INTRAVENOUS | Qty: 1000

## 2014-07-11 MED FILL — CITALOPRAM HYDROBROMIDE 20 MG PO TABS: 20 MG | ORAL | Qty: 1

## 2014-07-11 MED FILL — NORMAL SALINE FLUSH 0.9 % IV SOLN: 0.9 % | INTRAVENOUS | Qty: 10

## 2014-07-11 MED FILL — FUROSEMIDE 40 MG PO TABS: 40 MG | ORAL | Qty: 2

## 2014-07-11 MED FILL — MAGNESIUM OXIDE 400 (241.3 MG) MG PO TABS: 400 (241.3 Mg) MG | ORAL | Qty: 1

## 2014-07-11 MED FILL — CARVEDILOL 3.125 MG PO TABS: 3.125 MG | ORAL | Qty: 1

## 2014-07-11 MED FILL — EPLERENONE 25 MG PO TABS: 25 MG | ORAL | Qty: 2

## 2014-07-11 MED FILL — LISINOPRIL 5 MG PO TABS: 5 MG | ORAL | Qty: 1

## 2014-07-11 MED FILL — XIFAXAN 550 MG PO TABS: 550 MG | ORAL | Qty: 1

## 2014-07-11 MED FILL — CARVEDILOL 6.25 MG PO TABS: 6.25 MG | ORAL | Qty: 1

## 2014-07-11 NOTE — Progress Notes (Signed)
Physical Therapy  Facility/Department: [facility] ONCOLOGY  Daily Treatment Note  NAME: Andrew [name]  DOB: Mar 12, 1940  MRN: [id number]    Date of Service: 07/11/2014    Patient Diagnosis(es):   Patient Active Problem List    Diagnosis Date Noted   ??? Erectile dysfunction 04/30/2009     Priority: Low   ??? DVT (deep venous thrombosis) ([place]) 07/10/2014   ??? Hepatic encephalopathy ([place]) 07/07/2014   ??? Ataxia 07/07/2014   ??? Pancytopenia ([place]) 07/07/2014   ??? Benign essential HTN 07/03/2014   ??? Bronchiectasis without complication ([place]) 51/76/1607   ??? 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 Anemia 04/02/2014   ??? Hemispheric carotid artery syndrome    ??? CAD in native artery    ??? Hyperlipidemia    ??? Abnormal EKG 04/01/2014   ??? Visual changes 04/01/2014   ??? Lumbar compression fracture ([place]) 01/14/2014   ??? Chronic low back pain 01/14/2014   ??? Osteopenia 01/14/2014   ??? Coagulopathy ([place]) 01/14/2014   ??? 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 Chronic systolic CHF (congestive heart failure) ([place]) 10/18/2013   ??? Mild CAD    ??? Cirrhosis, nonalcoholic ([place])    ??? 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 BPH (benign prostatic hyperplasia) 08/01/2009   ??? Thrombocytopenia 04/30/2009   ??? Edema 04/30/2009       Past Medical History   Diagnosis Date   ??? Hypertension    ??? Kidney stones    ??? Alpha 1 antitrypsin deficiency    ??? Thrombocytopenia ([place]) 04/30/2009   ??? BPH (benign prostatic hyperplasia)    ??? 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 Chronic systolic CHF (congestive heart failure) ([place]) 10/18/2013   ??? CAD (coronary artery disease)      Non-obstructive   ??? Compression fracture of lumbar vertebra (HCC)      multiple, lumbar   ??? Leukopenia    ??? Anemia    ??? Pneumonia    ??? DVT (deep venous thrombosis) ([place])    ??? Cancer ([place])    ??? Hyperlipidemia      Past Surgical History   Procedure Laterality Date   ??? Av fistula repair  1970's   ??? Colonoscopy  4/12     5y   ???  Cardiac catheterization     ??? Eye surgery  04/12/2014     Laser Eye Surgery for glaucoma   ??? Cosmetic surgery  04/2013     eye lids       Restrictions  Restrictions/Precautions  Restrictions/Precautions: Fall Risk  Required Braces or Orthoses?: No  Position Activity Restriction  Other position/activity restrictions: OK to see per nurse Ander Purpura, DVT in R LE but on therapeutic  Subjective   General  Chart Reviewed: Yes  Family / Caregiver Present: Yes (wife )  Subjective  Subjective: pt pleasant and agreeable to therapy   General Comment  Comments: pt presented seated in chair upon PT arrival   Pain Screening  Patient Currently in Pain: Denies  Vital Signs  Patient Currently in Pain: Denies       Orientation  Orientation  Overall Orientation Status: Within Functional Limits  Objective      Transfers  Sit to Stand: Contact guard assistance  Stand to sit: Contact guard assistance  Ambulation  Ambulation?: Yes  Ambulation 1  Surface: level tile  Device: Rolling Walker  Assistance: Minimal assistance  Quality of Gait: flexed posture, shuffled steps, bilateral ER, verbal cues for erect posture/visual scanning (pt looking at ground)   Distance: 65'  Comments: verbal cues to keep RW close to BOS, MIN assist for RW management; attempted ambulation without AD in which pt demonstrated x2 lateral LOB with turning which required MIN-MOD assist to correct   Ambulation 2  Surface - 2: level tile  Device 2: No device (bilateral HHA )  Assistance 2: Contact guard assistance (CGA with HHA x2 )  Quality of Gait 2: improved step length, improved posture, improved gait speed with HHA vs. RW   Distance: 100'  Stairs/Curb  Stairs?: No  Neuromuscular Education  Neuromuscular Comments: static standing NBOS x20 sec, static standing NBOS EC x30 sec, forward/backward ambulation x15'- CGA-MIN assist   Balance  Posture: Fair (kyphotic posture with forward head )  Sitting - Static: Good  Sitting - Dynamic: Fair  Standing - Static: Fair (with AD  )  Standing - Dynamic: Fair (with AD)      Comment: pt able to reach outside BOS when sitting to preform toileting, don/doff clean brief, and don/doff socks without LOB;  pt CGA for standing dynamic balance tasks of donning clean brief, hand hygeine, oral care without LOB.      Assessment   Assessment: Decreased functional mobility ;Decreased strength;Decreased endurance;Decreased balance  Assessment: Pt demonstrates improvement in activity tolerance this session evidenced by increased ambulation distance. Pt continues to be at high risk of falling evidenced by multiple lateral LOB with attempted ambulation without AD. Pt benefit from further PT to improve mobility and decrease risk of falling.   Comment: pt no longer needs x2 assist for mobility   Treatment Diagnosis: decreased strength, decreased activity tolerance, impaired balance, impaired gait related to dx  Prognosis: Good  Requires PT Follow Up: Yes  Total Treatment Time: 30  Timed Code Treatment Minutes: 30 Minutes  Activity Tolerance  Activity Tolerance: Patient Tolerated treatment well  PT D/C Equipment  Equipment Needed: Yes (will need RW if d/c home for safe ambulation )  Walker: Rolling       Discharge Recommendations:  IP Rehab      Goals  Short term goals  Time Frame for Short term goals: prior to discharge  Short term goal 1: pt will be SUPV for bed mobility  Short term goal 2: pt will be SBA for transfers  Short term goal 3: pt will be able to ambulate 20' with or without AAD with CGA   Short term goal 4: pt will be able to ascend/descend 4 steps with HR with CGA   No goals met     Plan    Plan  Times per week: 2-5x  Times per day: Daily  Current Treatment Recommendations: Strengthening;Balance Training;Functional Mobility Training;Endurance Training;Gait Training;Neuromuscular Re-education;Pain Management;Safety Education & Training;Patient/Caregiver Education & Training;Equipment Evaluation, Education, & procurement  Patient Education: role of PT  and POC, discharge recommendations, safety with transfers  Safety Devices  Safety Devices in place: Yes  Type of devices: All fall risk precautions in place;Call light within reach;Chair alarm in place;Gait belt;Patient at risk for falls;Left in chair;Nurse notified Ander Purpura )  Restraints  Initially in place: No     Crisoforo Oxford, PT  License and [place] Number: Crisoforo Oxford PT DPT [phone]

## 2014-07-11 NOTE — Care Coordination-Inpatient (Signed)
Discharge Planning:  SW met with patient and his wife to get choices for ecf/snf. Patient's wife is declining to provide ecf/snf choices, as she has called [employer], and was informed that patient's case for ARU has not been submitted, therefore has not been denied. Additionally patient's wife was informed by the [facility] representative, that if the case were declined, she would still have the right to an appeal, SW spoke with the Interim Program Director,Kathy on ARU. She will contact Dr. Kathleene Hazel for further assessment and decision regarding ARU.  Patient will continue to be followed through discharge.     Zaelynn Fuchs A. Gilford Rile, MSW,LSW

## 2014-07-11 NOTE — Progress Notes (Signed)
[facility]   Progress Note      SUBJECTIVE:  Patient seen and examined. Doing OK.     OBJECTIVE    Physical  VITALS:  BP 117/71 mmHg   Pulse 71   Temp(Src) 98.3 ??F (36.8 ??C) (Oral)   Resp 16   Ht 5\' 5"  (1.651 m)   Wt 175 lb 14.4 oz (79.788 kg)   BMI 29.27 kg/m2   SpO2 94%  TEMPERATURE:  Current - Temp: 98.3 ??F (36.8 ??C); Max - Temp  Avg: 98.4 ??F (36.9 ??C)  Min: 98 ??F (36.7 ??C)  Max: 98.9 ??F (37.2 ??C)  BLOOD PRESSURE RANGE:  Systolic (53GUY), QIH:474 mmHg, Min:93 mmHg, QVZ:563 mmHg  ; Diastolic (87FIE), PPI:95 mmHg, Min:53 mmHg, Max:73 mmHg    24HR INTAKE/OUTPUT:    Intake/Output Summary (Last 24 hours) at 07/11/14 2156  Last data filed at 07/11/14 1630   Gross per 24 hour   Intake   1350 ml   Output      0 ml   Net   1350 ml       Conscious alert  Respiratory efforts normal  Abdomen mild distension  Extremities: 1 + edema    Data  Labs:  General Labs:  CBC with Differential:  Lab Results   Component Value Date    WBC 2.5 07/11/2014    RBC 2.39 07/11/2014    HGB 8.9 07/11/2014    HCT 26.1 07/11/2014    PLT 47 07/11/2014    MCV 109.1 07/11/2014    MCH 37.3 07/11/2014    MCHC 34.2 07/11/2014    RDW 26.7 07/11/2014    SEGSPCT 42.6 05/14/2011    BANDSPCT 5 07/11/2014    METASPCT 1 05/03/2014    LYMPHOPCT 25.0 07/11/2014    MONOPCT 1.0 07/11/2014    MYELOPCT 1 05/03/2014    EOSPCT 2.1 05/14/2010    BASOPCT 0.0 07/11/2014    MONOSABS 0.0 07/11/2014    LYMPHSABS 0.6 07/11/2014    EOSABS 0.0 07/11/2014    BASOSABS 0.0 07/11/2014    DIFFTYPE Scan-K 05/14/2011     BMP:  Lab Results   Component Value Date    NA 143 07/11/2014    K 3.6 07/11/2014    CL 108 07/11/2014    CO2 29 07/11/2014    BUN 12 07/11/2014    LABALBU 1.9 07/09/2014    CREATININE 0.5 07/11/2014    CALCIUM 8.1 07/11/2014    GFRAA >60 07/11/2014    GFRAA >60 05/14/2011    LABGLOM >60 07/11/2014    GLUCOSE 103 07/11/2014     Hepatic Function Panel:  Lab Results   Component Value Date    ALKPHOS 129 07/09/2014    ALT 20 07/09/2014    AST 35 07/09/2014     PROT 5.5 07/09/2014    PROT 6.0 05/14/2011    BILITOT 1.1 07/09/2014    BILIDIR 0.4 07/09/2014    IBILI 0.7 07/09/2014    LABALBU 1.9 07/09/2014     LDH:  No results found for: LDH  PT/INR:    Lab Results   Component Value Date    PROTIME 14.7 07/07/2014    INR 1.28 07/07/2014    INR 1.17 05/14/2010     PTT:    Lab Results   Component Value Date    APTT 29.7 10/17/2009   [APTT    Current Medications  Current facility-administered medications: enoxaparin (LOVENOX) injection 80 mg, 1 mg/kg, Subcutaneous, BID  furosemide (LASIX) injection 20  mg, 20 mg, Intravenous, See Admin Instructions  diphenhydrAMINE (BENADRYL) tablet 25 mg, 25 mg, Oral, See Admin Instructions  rifaximin (XIFAXAN) tablet 550 mg, 550 mg, Oral, BID  magnesium oxide (MAG-OX) tablet 400 mg, 400 mg, Oral, BID  albuterol sulfate HFA 108 (90 BASE) MCG/ACT inhaler 2 puff, 2 puff, Inhalation, Q6H PRN  citalopram (CELEXA) tablet 10 mg, 10 mg, Oral, Daily  furosemide (LASIX) tablet 80 mg, 80 mg, Oral, BID  potassium chloride SA (K-DUR;KLOR-CON M) tablet 40 mEq, 40 mEq, Oral, PRN **OR** potassium chloride 20 MEQ/15ML (10%) oral solution 40 mEq, 40 mEq, Oral, PRN **OR** potassium chloride 10 mEq/100 mL IVPB (Peripheral Line), 10 mEq, Intravenous, PRN  sodium chloride flush 0.9 % injection 10 mL, 10 mL, Intravenous, 2 times per day  sodium chloride flush 0.9 % injection 10 mL, 10 mL, Intravenous, PRN  acetaminophen (TYLENOL) tablet 650 mg, 650 mg, Oral, Q4H PRN  HYDROcodone-acetaminophen (NORCO) 5-325 MG per tablet 1 tablet, 1 tablet, Oral, Q4H PRN **OR** HYDROcodone-acetaminophen (NORCO) 5-325 MG per tablet 2 tablet, 2 tablet, Oral, Q4H PRN  morphine (PF) injection 2 mg, 2 mg, Intravenous, Q2H PRN **OR** morphine (PF) injection 4 mg, 4 mg, Intravenous, Q2H PRN  magnesium hydroxide (MILK OF MAGNESIA) 400 MG/5ML suspension 30 mL, 30 mL, Oral, Daily PRN  ondansetron (ZOFRAN) injection 4 mg, 4 mg, Intravenous, Q6H PRN  famotidine (PEPCID) tablet 20 mg, 20 mg,  Oral, BID PRN  eplerenone (INSPRA) tablet 50 mg, 50 mg, Oral, Daily  lisinopril (PRINIVIL;ZESTRIL) tablet 5 mg, 5 mg, Oral, Nightly  carvedilol (COREG) tablet 3.125 mg, 3.125 mg, Oral, Daily with breakfast **AND** carvedilol (COREG) tablet 6.25 mg, 6.25 mg, Oral, QPM  diclofenac (VOLTAREN) 0.1 % ophthalmic solution 1 drop, 1 drop, Both Eyes, Daily  lactulose (CHRONULAC) 10 GM/15ML solution 20 g, 20 g, Oral, Q4H    ASSESSMENT AND PLAN    1. Cirrhosis, Hepatic encephalopathy    2. Pancytopenia:    BMA/BX report noted. 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 Discussed with pathologist - Dr. Baltazar Najjar who feels that changes in neutrophil precursors are suspicious for MDS. Await cytogenetics.  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      Mare Loan, MD

## 2014-07-11 NOTE — Progress Notes (Signed)
Bedside handoff completed with  Lauren RN

## 2014-07-11 NOTE — Progress Notes (Signed)
Occupational Therapy  Daily Treatment Note  Date: 07/11/2014    Patient Name: Andrew [name]  ZOX:[id number]     DOB: [birth year]     Restrictions  Restrictions/Precautions  Restrictions/Precautions: Fall Risk  Required Braces or Orthoses?: No  Position Activity Restriction  Other position/activity restrictions: OK to see per nurse Andrew [name], DVT in R LE but on therapeutic  Subjective   General  Chart Reviewed: Yes  Additional Pertinent Hx: HTN, BPH, cirrhosis (non-alcoholic), compression fx, CAD, CHF, DVT, cancer, leukopenia  Response to previous treatment: Patient with no complaints from previous session  Family / Caregiver Present: Yes  Diagnosis: dizzy  Subjective  Subjective: Patient sitting in chair, agreeable to therapy.   Pain Assessment  Patient Currently in Pain: Denies  Vital Signs  Patient Currently in Pain: Denies   Orientation  Orientation  Overall Orientation Status: Within Functional Limits  Objective    ADL  Grooming: Contact guard assistance;Verbal cueing (patient needed multiple cues to sequence tasks)  LE Dressing: Increased time to complete;Stand by assistance (MIn assist to change pull-up attends, SBA socks. )  Toileting: Minimal assistance (Min assist toileting. )  Transfer: Contact guard assistance;Minimal assistance  Additional Comments: Patient had several LOB with hand held assist of 1, improved to CGA/min with RW x 80 feet. Sat in chair, 100 feet with bilateral hand held assist with cues for upright stance.         Balance  Sitting Balance: Supervision  Standing Balance: Contact guard assistance  Standing Balance  Time: @ 3 minutes, @ 5 minutes  Activity: functional ambulation-- several LOB with 1 person hand held assist (mod assist to correct), improved with RW but gait was best with 2 person hand held assist, cues for upright stance.   Sit to stand: Contact guard assistance  Stand to sit: Contact guard assistance  Comment: Gait best with 2 person hand held assist.   Toilet Transfers  Toilet -  Technique: Ambulating  Equipment Used: Standard toilet  Transfer Level: Contact guard assistance  Toilet Transfers Comments: Grab bar on right     Transfers  Sit to stand: Contact guard assistance  Stand to sit: Contact guard assistance                       Cognition  Overall Cognitive Status: Impaired  Arousal/Alertness: Delayed responses to stimuli (mild delay with answering questions. )  Safety Judgement: Decreased awareness of need for safety  Insights: Decreased awareness of deficits  Sequencing and Organization: Assistance required to generate solutions;Assistance required to implement solutions;Assistance required to identify errors made  Additional Comments: At sink, told patient to wash hands-- patient then picked up deodorant and toothbrush and unable to process tasks, cues needed. Patient put deodorant bottle down but then while holding toothbrush asked if he should wash hands or brush teeth.     Perception  Overall Perceptual Status: WFL                                Assessment   Assessment: Decreased functional mobility ;Decreased ADL status;Decreased high-level IADLs;Decreased balance  Comments: Pt is not at baseline level of function and will benefit from skilled OT in order to address the above limitations. Recommend ARU for d/c, however [employer] has denied and appeal is pending. Patient would need d/c to a SNF if ARU is not accepted.   Treatment Diagnosis: Decreased functional mobility, ADL/IADL status, balance related  to dizzy  Rehab Potential: Good  Recommendations: Inpatient Rehab;Patient would benefit from continued therapy after discharge  Goal Formulation: Patient;Family  Requires OT Follow Up: Yes  Total Treatment Time: 29  Timed Code Treatment Minutes: 29 Minutes  Response to Treatment  Response to Treatment: Patient Tolerated treatment well  Safety Devices  Safety Devices in place: Yes  Type of devices: All fall risk precautions in place;Left in chair;Nurse notified;Call light within  reach;Chair alarm in place;Patient at risk for falls  Restraints  Initially in place: No  OT D/C Equipment  Equipment Needed: Yes  Shower Chair: to increase safety and independence in bathing          Discharge Recommendations:  Inpatient Rehab, Patient would benefit from continued therapy after discharge     Plan   Plan  Times per week: 2-5x  Times per day: Daily  Patient Education: Role of OT, OT evaluation, safe functional mobility, POC, d/c recommendations       Short term goals  Time Frame for Short term goals: Discharge  Short term goal 1: Pt will be supervision for all UB ADLs.- supervision and cues to sequence grooming task  Short term goal 2: Pt will be supervision for all LB ADLs-- min assist to change attends (thread left leg)  Short term goal 3: Pt will be SBA for functional mobility with AD as needed-- CGA/min assist with RW, gait best with hand held assist of 2 people   Short term goal 4: Pt will be supervision for functional transfers to/from all ADL surfaces-- CGA and cues needed.   Long term goals  Time Frame for Long term goals : STG=LTG       Sherol Dade, OT  License and Documentation Cosign  Therapy License Number: Astrid Divine. [place], [place]

## 2014-07-11 NOTE — Progress Notes (Signed)
Bedside hand off and report complete with julie RN. Pt. Denies further needs at this time, call light is in reach, bed alarm engaged. Eliezer Champagne 7:29 AM

## 2014-07-11 NOTE — Progress Notes (Signed)
Bedside handoff completed with  Andrew [name]. Pt resting in bed respirations even and easy. Call light in reach. Fall precautions in place. No needs expressed at this time.

## 2014-07-11 NOTE — Progress Notes (Signed)
Pt. Resting in chair, wife at bedside assisting to order lunch at this time. Pt. Denies any additional needs, will continue to monitor. Doloros Kwolek 1:28 PM

## 2014-07-11 NOTE — Progress Notes (Signed)
Pt assisted up to bedside commode. Pt had medium loose stool and was incontinent of urine. Assisted back to bed. Fall precautions in place. Call light in reach. Family at bedside.

## 2014-07-11 NOTE — Progress Notes (Signed)
Patient sitting up in bed with family at bedside.  Reviewed morning assessment from previous RN and agree with documentation.  No needs expressed at this time

## 2014-07-11 NOTE — Progress Notes (Addendum)
[facility]  Gastroenterology Progress Note    Andrew [name] is a 74 y.o. male patient.  Principal Problem:    Hepatic encephalopathy (HCC)  Active Problems:    Cirrhosis, nonalcoholic (HCC)    Hyperlipidemia    Benign essential HTN    Ataxia    Pancytopenia (HCC)    DVT (deep venous thrombosis) (HCC)      SUBJECTIVE:   Had 3 BMs after the lactulose enema. More alert today but some trouble maintain skeletal muscle contractions per wife.     ROS:   No fever, chills  No chest pain, palpitations  No SOB, cough    Physical    VITALS:  BP 93/53 mmHg   Pulse 74   Temp(Src) 98 ??F (36.7 ??C) (Oral)   Resp 16   Ht 5\' 5"  (1.651 m)   Wt 175 lb 14.4 oz (79.788 kg)   BMI 29.27 kg/m2   SpO2 91%  TEMPERATURE:  Current - Temp: 98 ??F (36.7 ??C); Max - Temp  Avg: 98.4 ??F (36.9 ??C)  Min: 97.5 ??F (36.4 ??C)  Max: 99.2 ??F (37.3 ??C)    NAD   Regular rate   Lungs CTA Bilaterally  Abdomen soft, ND, NT,  Bowel sounds normal.  No asterixis, Amore alert today. Oriented.     Data    Data Review:    Recent Labs      07/09/14   0531  07/10/14   0526  07/11/14   0527   WBC  1.8*  1.9*  2.5*   HGB  7.6*  7.7*  8.9*   HCT  22.8*  23.0*  26.1*   MCV  120.9*  119.5*  109.1*   PLT  47*  49*  47*     Recent Labs      07/09/14   0532  07/10/14   0527  07/11/14   0527   NA  142  145  143   K  3.9  3.6  3.6   CL  108  110  108   CO2  29  28  29    BUN  16  12  12    CREATININE  0.8  0.8  0.5*     Recent Labs      07/09/14   0532   AST  35   ALT  20   BILIDIR  0.4*   BILITOT  1.1*   ALKPHOS  129     No results for input(s): LIPASE, AMYLASE in the last 72 hours.  No results for input(s): PROTIME, INR in the last 72 hours.  No results for input(s): PTT in the last 72 hours.    Abdominal US: 07/08/14  IMPRESSION:   Limited study as above.      Cirrhosis and portal hypertension.      No evidence of ascites.         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 Followed by Dr. Einar Grad. No signs of GI bleeding. No ascites on [place].   Hepatic encephalopathy - No GI bleeding or AKI  which can precipitate HE. No PNA on CXR, although CXR c/w pulmonary edema. UA trace leuk esterase but urine cx with < 25,000 Enterococcus. No ascites. Suspect encephalopathy was from taking less lactulose at home. More alert today s/p lactulose enemas. No asterixis.   Macrocytic anemia - no signs of GI bleed. Negative EGD and colonoscopy Nov 2015. No iron, folate, or B12 deficiency. Heme following. 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  Received 2 U PRBC on 5/4.  Pancytopenia - heme following.   Acute DVT - dopplers with completely occluding RLE DVT (was paritally occlusive in April) and acute DVT RLE. Started on lovenox.     PLAN :  - continue Xifaxan BID  - continue lactulose q4 hours      Discussed with Dr. Cottie Banda, PA-C  [place]  I have personally performed a face to face diagnostic evaluation on this patient.  I have interviewed and examined the patient and I agree with the findings and recommended plan of care.  In summary, my findings and plan are the following: As above.  No asterixis today.  MS better, still not baseline.  Will continue lactulose and Xifaxan.    Norwood Levo, MD  [place] [facility] [place]  07/11/2014

## 2014-07-11 NOTE — Progress Notes (Signed)
Shift assessment complete and morning medications passed. Pt. Aware of next time for any PRN medications. Pt. Is resting comfortably in bed at this time. Pt. Denies any additional needs. Call light in reach, and pt. Aware to call out for assistance. Kayman Snuffer 8:24 AM

## 2014-07-11 NOTE — Progress Notes (Signed)
Progress Note - Dr. Tasia Catchings - Internal Medicine  PCP: Alecia Lemming, DO [place] / [place] [address] [phone]    Hospital Day: 3  Code Status: Full Code  Current Diet: DIET GENERAL; No Added Salt (3-4 GM)        CC: follow up on medical issues    Subjective:   Andrew [name] is a 74 y.o. male.    He denies problems    Pt's wife wants to be denied for ARU as they really want it  Will likely appeal any denial  Thus, we need to make ARU process complete in order to fully consider d/c plans    He denies chest pain, denies shortness of breath, denies nausea,  denies emesis.    10 system Review of Systems is reviewed with patient, and pertinent positives are listed here: None . Otherwise, Review of systems is negative.     I have reviewed the patient's medical and social history in detail and updated the computerized patient record.  To recap: He  has a past medical history of Hypertension; Kidney stones; Alpha 1 antitrypsin deficiency; Thrombocytopenia ([place]); BPH (benign prostatic hyperplasia); Cirrhosis, nonalcoholic ([place]); Chronic systolic CHF (congestive heart failure) ([place]); CAD (coronary artery disease); Compression fracture of lumbar vertebra ([place]); Leukopenia; Anemia; Pneumonia; DVT (deep venous thrombosis) ([place]); Cancer ([place]); and Hyperlipidemia.. He  has past surgical history that includes AV fistula repair (1970's); Colonoscopy (4/12); Cardiac catheterization; eye surgery (04/12/2014); and Cosmetic surgery (04/2013).[name] He  reports that he has never smoked. He has never used smokeless tobacco. He reports that he does not drink alcohol or use illicit drugs..        Active Hospital Problems    Diagnosis Date Noted   ??? DVT (deep venous thrombosis) ([place]) [I82.409] 07/10/2014   ??? Hepatic encephalopathy ([place]) [K72.90] 07/07/2014   ??? Ataxia [R27.0] 07/07/2014   ??? Pancytopenia ([place]) [D61.818] 07/07/2014   ??? Benign essential HTN [I10] 07/03/2014   ??? Hyperlipidemia [E78.5]    ??? Cirrhosis, nonalcoholic (HCC)  [V78.46]        Current facility-administered medications: enoxaparin (LOVENOX) injection 80 mg, 1 mg/kg, Subcutaneous, BID  furosemide (LASIX) injection 20 mg, 20 mg, Intravenous, See Admin Instructions  diphenhydrAMINE (BENADRYL) tablet 25 mg, 25 mg, Oral, See Admin Instructions  rifaximin (XIFAXAN) tablet 550 mg, 550 mg, Oral, BID  magnesium oxide (MAG-OX) tablet 400 mg, 400 mg, Oral, BID  albuterol sulfate HFA 108 (90 BASE) MCG/ACT inhaler 2 puff, 2 puff, Inhalation, Q6H PRN  citalopram (CELEXA) tablet 10 mg, 10 mg, Oral, Daily  furosemide (LASIX) tablet 80 mg, 80 mg, Oral, BID  potassium chloride SA (K-DUR;KLOR-CON M) tablet 40 mEq, 40 mEq, Oral, PRN **OR** potassium chloride 20 MEQ/15ML (10%) oral solution 40 mEq, 40 mEq, Oral, PRN **OR** potassium chloride 10 mEq/100 mL IVPB (Peripheral Line), 10 mEq, Intravenous, PRN  sodium chloride flush 0.9 % injection 10 mL, 10 mL, Intravenous, 2 times per day  sodium chloride flush 0.9 % injection 10 mL, 10 mL, Intravenous, PRN  acetaminophen (TYLENOL) tablet 650 mg, 650 mg, Oral, Q4H PRN  HYDROcodone-acetaminophen (NORCO) 5-325 MG per tablet 1 tablet, 1 tablet, Oral, Q4H PRN **OR** HYDROcodone-acetaminophen (NORCO) 5-325 MG per tablet 2 tablet, 2 tablet, Oral, Q4H PRN  morphine (PF) injection 2 mg, 2 mg, Intravenous, Q2H PRN **OR** morphine (PF) injection 4 mg, 4 mg, Intravenous, Q2H PRN  magnesium hydroxide (MILK OF MAGNESIA) 400 MG/5ML suspension 30 mL, 30 mL, Oral, Daily PRN  ondansetron (ZOFRAN)  injection 4 mg, 4 mg, Intravenous, Q6H PRN  famotidine (PEPCID) tablet 20 mg, 20 mg, Oral, BID PRN  eplerenone (INSPRA) tablet 50 mg, 50 mg, Oral, Daily  lisinopril (PRINIVIL;ZESTRIL) tablet 5 mg, 5 mg, Oral, Nightly  carvedilol (COREG) tablet 3.125 mg, 3.125 mg, Oral, Daily with breakfast **AND** carvedilol (COREG) tablet 6.25 mg, 6.25 mg, Oral, QPM  diclofenac (VOLTAREN) 0.1 % ophthalmic solution 1 drop, 1 drop, Both Eyes, Daily  lactulose (CHRONULAC) 10 GM/15ML solution  20 g, 20 g, Oral, Q4H         Objective:  BP 93/53 mmHg   Pulse 74   Temp(Src) 98 ??F (36.7 ??C) (Oral)   Resp 16   Ht 5\' 5"  (1.651 m)   Wt 175 lb 14.4 oz (79.788 kg)   BMI 29.27 kg/m2   SpO2 91%     Patient Vitals for the past 24 hrs:   BP Temp Temp src Pulse Resp SpO2 Weight   07/11/14 0815 93/53 mmHg 98 ??F (36.7 ??C) Oral 74 16 91 % -   07/11/14 0702 100/62 mmHg 98.6 ??F (37 ??C) - 81 18 91 % 175 lb 14.4 oz (79.788 kg)   07/11/14 0041 115/72 mmHg 98.9 ??F (37.2 ??C) Oral 71 18 95 % -   07/10/14 2202 110/65 mmHg 98.2 ??F (36.8 ??C) - 70 18 93 % -   07/10/14 1741 113/74 mmHg - - 79 - - -   07/10/14 1613 105/66 mmHg 98.1 ??F (36.7 ??C) Oral 65 18 95 % -   07/10/14 1510 124/66 mmHg 98.6 ??F (37 ??C) Oral 71 18 91 % -   07/10/14 1456 123/63 mmHg 98.3 ??F (36.8 ??C) Oral 67 20 94 % -   07/10/14 1341 97/52 mmHg 97.5 ??F (36.4 ??C) Axillary 68 20 92 % -   07/10/14 1305 - 98.3 ??F (36.8 ??C) Oral - - - -   07/10/14 1237 107/57 mmHg 99.2 ??F (37.3 ??C) Oral 70 20 95 % -   07/10/14 1227 108/58 mmHg 98.3 ??F (36.8 ??C) Oral 67 18 94 % -   07/10/14 1212 108/60 mmHg 98.3 ??F (36.8 ??C) Oral 76 18 96 % -     Patient Vitals for the past 96 hrs (Last 3 readings):   Weight   07/11/14 0702 175 lb 14.4 oz (79.788 kg)   07/10/14 0517 178 lb (80.74 kg)   07/09/14 0022 175 lb (79.379 kg)           Intake/Output Summary (Last 24 hours) at 07/11/14 0950  Last data filed at 07/10/14 1750   Gross per 24 hour   Intake   1604 ml   Output    450 ml   Net   1154 ml         Physical Exam:   S1, S2 normal, no murmur, rub or gallop, regular rate and rhythm  clear to auscultation bilaterally  abdomen is soft without significant tenderness, masses, organomegaly or guarding  extremities normal, atraumatic, no cyanosis or edema    Labs:  Lab Results   Component Value Date    WBC 2.5* 07/11/2014    HGB 8.9* 07/11/2014    HCT 26.1* 07/11/2014    PLT 47* 07/11/2014    CHOL 101 04/02/2014    TRIG 83 04/02/2014    HDL 22* 04/02/2014    ALT 20 07/09/2014    AST 35 07/09/2014    NA  143 07/11/2014    K 3.6 07/11/2014    CL 108 07/11/2014  CREATININE 0.5* 07/11/2014    BUN 12 07/11/2014    CO2 29 07/11/2014    TSH 2.15 08/15/2013    PSA 0.55 08/15/2013    INR 1.28* 07/07/2014    LABA1C 5.0 01/10/2014    LABMICR Not Indicated 07/08/2014     Lab Results   Component Value Date    TROPONINI <0.01 07/07/2014       Recent Imaging Results are Reviewed:  Ct Head Wo Contrast    07/07/2014   EXAMINATION: CT OF THE HEAD WITHOUT CONTRAST  07/07/2014 11:11 pm  TECHNIQUE: CT of the head was performed without the administration of intravenous contrast.  COMPARISON: 04/01/2014  HISTORY: ORDERING SYSTEM PROVIDED HISTORY: dizzy TECHNOLOGIST PROVIDED HISTORY: Ordering Physician Provided Reason for Exam: dizzy  FINDINGS: BRAIN/VENTRICLES: There is no acute intracranial hemorrhage, mass effect or midline shift.  No abnormal extra-axial fluid collection.  The gray-white differentiation is maintained without evidence of an acute infarct.  There is no evidence of hydrocephalus.  ORBITS: The visualized portion of the orbits demonstrate no acute abnormality.  SINUSES: There is minimal mucosal thickening in the left maxillary sinus. The remaining visualized paranasal sinuses are clear.  The mastoid air cells are unremarkable.  SOFT TISSUES/SKULL:  No acute abnormality of the visualized skull or soft tissues.     07/07/2014   IMPRESSION: No acute intracranial abnormality.     Ct Chest W Contrast    06/13/2014   EXAMINATION: CT OF THE CHEST WITH CONTRAST 06/13/2014 3:56 pm  TECHNIQUE: CT of the chest was performed with the administration of intravenous contrast. Multiplanar reformatted images are provided for review.  COMPARISON: None.  HISTORY: Bacterial pneumonia, unspecified  FINDINGS: Mediastinum:  *A lymph node is seen pretracheal upper mediastinum interposed between the proximal left common carotid artery and brachiocephalic artery, image 31. This measures approximately 12 mm.  Subcarinal density possible subcarinal  adenopathy, difficult to separate from adjacent fluid. *While there is no significant cardiac enlargement, there is chamber enlargement, left ventricle and left atrium.  This could indicate mitral valve disease.  Coronary calcification is present. *There is mild coronary calcification. *Vasculature is unremarkable for age. Lungs/pleura:  *Large pleural effusion is seen on the right and small effusion on the left. Multifocal patchy areas of airspace disease are seen bilaterally both upper and lower lobes.  Right lower lobe has a somewhat bronchus centric distribution.  There is bronchiectasis bilaterally.  Focal area of consolidation is seen, right upper lobe anteriorly. *Airways are unremarkable. Soft Tissues/Bones: Bilateral gynecomastia is present.  Small nodular lesion projects from the inferior right thyroid gland.  Sagittal projection demonstrates multiple compression fractures L1 moderate to severe, L2 moderate, T9 moderate and T10 minimal.  Bones are demineralized.  These fractures are faintly noted on prior lateral chest radiograph.  Upper Abdomen: The liver is grossly abnormal, with small overall size, increased density and extensive nodularity throughout the serosal surface. The spleen is mildly enlarged.  There is are extensive unenhanced splenic varices.  Multiple unenhanced gastric and paraesophageal varices are also noted.  Small right renal calculus present     06/13/2014   IMPRESSION: Multifocal airspace disease most likely pneumonia with large pleural effusion primarily on the right.  Chronic bronchiectasis.  Left ventricular and atrial chamber enlargement could indicate mitral valve disease.  Advanced cirrhotic morphology of the liver with portal hypertension and extensive varices, unenhanced on current CT.  Multiple compression fractures essentially unchanged.  Gynecomastia.  Multiple prior moderate compression fractures.     US Abdomen  Complete    07/08/2014   EXAMINATION: COMPLETE ABDOMINAL  ULTRASOUND 07/08/2014 6:04 pm  COMPARISON: None  HISTORY: ORDERING SYSTEM PROVIDED HISTORY: CIRRHOSIS TECHNOLOGIST PROVIDED HISTORY: Ordering Physician Provided Reason for Exam: cirrhosis, r/o ascites Acuity: Acute Type of Exam: Initial  FINDINGS: Study is limited by body habitus.  LIVER: Evaluation of the liver is limited.  There is diffuse increased echogenicity with no focal lesion noted.  Somewhat nodular contour of the liver is compatible with known cirrhosis.  BILIARY SYSTEM: Limited imaging of the gallbladder is grossly unremarkable.  Common bile duct is within normal limits measuring 6 mm.  KIDNEYS: Right kidney is nonvisualized.  Left kidney measures 10.1 x 4.9 x 5.3 cm and is grossly unremarkable.  PANCREAS: Pancreas is nonvisualized.  SPLEEN: Spleen measures 10.1 cm.  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  IVC: The IVC is patent.  AORTA: Aorta is obscured.  OTHER: No evidence of ascites.     07/08/2014   IMPRESSION: Limited study as above.  Cirrhosis and portal hypertension.  No evidence of ascites.     Xr Chest Portable    07/08/2014   EXAMINATION: SINGLE VIEW OF THE CHEST  07/07/2014 11:34 pm  COMPARISON: 06/03/2014  HISTORY: ORDERING SYSTEM PROVIDED HISTORY: other TECHNOLOGIST PROVIDED HISTORY: Ordering Physician Provided Reason for Exam: dizziness Acuity: Unknown Type of Exam: Unknown  FINDINGS: Cardiac leads project over the chest.  Diffuse interstitial opacity is demonstrated bilaterally.  Heterogeneous opacity is seen adjacent to the left heart border.  No evidence of pleural effusion.  Negative for pneumothorax. Cardiac and mediastinal silhouettes are similar to prior.  Old posterior left 7th rib fracture.     07/08/2014   IMPRESSION: Mild pulmonary edema.     Vl Extremity Venous Right    07/10/2014   Lower Extremities DVT Study   Demographics    Patient Name      Andrew [name]    Date of Study     07/09/2014          Gender              Male    Patient Number    [id number]          Date of Birth       [birth year]     Visit Number      [id number]         Age                 89 year(s)    Accession Number  [id number]           Room Number         0102    Corporate ID      [id number]            Sonographer         Erasmo Leventhal,                                                            RVS    Ordering          Deniece Portela,   Interpreting        [facility] Vascular  Physician         MD  Physician           Peggyann Shoals,                                                            MD, [facility], RPVI   Procedure  Type of Study:    Veins:Lower Extremities DVT Study, VL EXTREMITY VENOUS DUPLEX RIGHT.    Generic Orders:VL EXTREMITY VENOUS DUPLEX RIGHT.    Vascular Sonographer Report   Additional Indications:Swelling  Impressions Right Impression Acute totally occluding deep vein thrombosis involving the right proximal PTV and one gastroc vein zone 5-6. Acute partially occluding deep vein thrombosis involving the popliteal vein and right one gastroc vein zone 5-6. No other evidence of deep vein or superficial vein thrombosis involving the right lower extremity and the left common femoral vein. Calf veins were not well visualized on the right due to patient positioning and edema.  Verbal to [employer].  Conclusions    Summary    Acute totally occluding deep vein thrombosis involving the right proximal  PTV and one gastroc vein zone 5-6.  Acute partially occluding deep vein thrombosis involving the popliteal vein  and right one gastroc vein zone 5-6.  Calf veins were not well visualized on the right due to patient positioning  and edema.    Signature    ------------------------------------------------------------------  Electronically signed by Peggyann Shoals, MD, [facility], RPVI  (Interpreting physician) on 07/10/2014 at 11:34 AM  ------------------------------------------------------------------   Patient Status:Routine. [place] - Vascular Lab. Technical Quality:Poor visualization.  Risk Factors  History +------------------+----------+----------------------------------------------+ !Diagnosis         !Date      !Comments                                      ! +------------------+----------+----------------------------------------------+ !Previous Procedure!06/27/2014!Venous Doppler Bilateral: Acute DVT right     ! !                  !          !gastroc veins.                                ! +------------------+----------+----------------------------------------------+  Velocities are measured in cm/s ; Diameters are measured in mm  Right Lower Extremities DVT Study Measurements Right 2D Measurements +------------------------+----------+---------------+----------+ !Location                !Visualized!Compressibility!Thrombosis! +------------------------+----------+---------------+----------+ !Sapheno Femoral Junction!Yes       !Yes            !None      ! +------------------------+----------+---------------+----------+ !GSV Thigh               !Yes       !Yes            !None      ! +------------------------+----------+---------------+----------+ !Common Femoral          !Yes       !Yes            !None      ! +------------------------+----------+---------------+----------+ !Prox Femoral            !Yes       !  Yes            !None      ! +------------------------+----------+---------------+----------+ !Mid Femoral             !Yes       !Yes            !None      ! +------------------------+----------+---------------+----------+ !Dist Femoral            !Yes       !Yes            !None      ! +------------------------+----------+---------------+----------+ !Deep Femoral            !Yes       !Yes            !None      ! +------------------------+----------+---------------+----------+ !Popliteal               !Yes       !Partial        !Acute     ! +------------------------+----------+---------------+----------+ !GSV Below Knee          !Yes       !Yes            !None      !  +------------------------+----------+---------------+----------+ !Gastroc                 !Yes       !Partial        !Acute     ! +------------------------+----------+---------------+----------+ !PTV                     !Yes       !No             !Acute     ! +------------------------+----------+---------------+----------+ !Peroneal                !Yes       !Yes            !None      ! +------------------------+----------+---------------+----------+ !SSV                     !Yes       !Yes            !None      ! +------------------------+----------+---------------+----------+  Right Doppler Measurements +--------------+------+------+------------+ !Location      !Signal!Reflux!Reflux (sec)! +--------------+------+------+------------+ !Common Femoral!Phasic!      !            ! +--------------+------+------+------------+ !Femoral       !Phasic!      !            ! +--------------+------+------+------------+ !Deep Femoral  !Phasic!      !            ! +--------------+------+------+------------+ !Popliteal     !Phasic!      !            ! +--------------+------+------+------------+  Left Lower Extremities DVT Study Measurements Left 2D Measurements +------------------------+----------+---------------+----------+ !Location                !Visualized!Compressibility!Thrombosis! +------------------------+----------+---------------+----------+ !Sapheno Femoral Junction!Yes       !Yes            !None      ! +------------------------+----------+---------------+----------+ !Common Femoral          !Yes       !Yes            !None      ! +------------------------+----------+---------------+----------+  Left Doppler Measurements +--------------+------+------+------------+ !Location      !Signal!Reflux!Reflux (sec)! +--------------+------+------+------------+ !Common Femoral!Phasic!      !            ! +--------------+------+------+------------+    Cta Pulmonary With Contrast    06/27/2014   EXAMINATION: CTA OF THE CHEST  06/27/2014 2:53 pm  TECHNIQUE: CTA of the chest was performed after the administration of intravenous contrast.  Multiplanar reformatted images are provided for review.  MIP images are provided for review.  Patient received 100 mL Isovue 370 intravenous contrast.  COMPARISON: CT chest with contrast June 13, 2014.  HISTORY: ORDERING SYSTEM PROVIDED HISTORY: DVT (deep venous thrombosis), unspecified laterality [facility]) TECHNOLOGIST PROVIDED HISTORY: Ordering Physician Provided Reason for Exam: DVT, SOB Injury/Trauma or Illness: Illness/Other Acuity: Acute Type of Exam: Initial Relevant Medica/Surgical History: CHF, cardiomyopathy  FINDINGS: Pulmonary Arteries: Pulmonary arteries are adequately opacified for evaluation.  No evidence of intraluminal filling defect to suggest pulmonary embolism.  Main pulmonary artery is normal in caliber.  Mediastinum: No evidence of mediastinal lymphadenopathy.  Heart is enlarged, similar in appearance.  The heart and pericardium demonstrate no acute abnormality.  There is no acute abnormality of the thoracic aorta. Nodularity of thyroid similar in appearance.  Lungs/pleura: The central airways are patent.  There is a moderate right-sided pleural effusion which appears slightly decreased compared to prior study.  There is mild patchy dependent opacities in the right upper lobe, similar in appearance.  There are dependent subpleural opacities within the lingula and left lower lobe.  There is mild opacity within the right middle lobe which is linear in appearance in appears slightly improved compared to prior study.  2 nodular appearing densities are again seen in the right lower lobe on image number 65, unchanged.  No pneumothorax.  Upper Abdomen: Cirrhotic liver with mildly distended gallbladder is similar in appearance to prior study.  Soft Tissues/Bones: Multiple compression fractures again seen, most pronounced at the thoracolumbar junction, similar in appearance.  Old right-sided rib  fractures are also seen.  No acute bone or soft tissue abnormality.  Gynecomastia is again seen.     06/27/2014   IMPRESSION: No evidence of pulmonary embolism.  Moderate right pleural effusion, minimally decreased.  Persistent patchy areas of airspace opacity which may be related to atelectasis versus pneumonia, slightly improved.  2 nodular appearing densities are seen within the right lower lobe which may be related to the airspace disease or represent lung nodules.  Follow-up recommended with dedicated chest CT in 3 months.  Remote compression fractures again seen.  Cirrhotic liver.     Vl Extremity Venous Bilateral    07/09/2014   Vascular Lower Extremities DVT Study Procedure  -- PRELIMINARY SONOGRAPHER REPORT --    Demographics    Patient Name      Andrew [name]    Date of Study     07/09/2014          Gender              Male    Patient Number    [id number]          Date of Birth       09-Feb-1941    Visit Number      [id number]         Age                 74 year(s)    Accession Number  [id number]  Room Number         9211    Corporate ID      [id number]            Sonographer         Erasmo Leventhal,                                                            RVS    Ordering          Deniece Portela,   Interpreting        [facility] Vascular  Physician         MD                  Physician   Procedure  Type of Study:    Veins:Lower Extremities DVT Study, VL EXTREMITY VENOUS DUPLEX RIGHT.   Tech Comments Right Acute totally occluding deep vein thrombosis involving the right proximal PTV and one gastroc vein zone 5-6. Acute partially occluding deep vein thrombosis involving the popliteal vein and right one gastroc vein zone 5-6. No other evidence of deep vein or superficial vein thrombosis involving the right lower extremity and the left common femoral vein. Calf veins were not well visualized on the right due to patient positioning and edema.  verbal to [employer].    Vl Extremity Venous Bilateral    06/27/2014    Lower Extremities DVT Study   Demographics    Patient Name       Andrew [name]    Date of Study      06/27/2014        Gender              Male    Patient Number     [id number]        Date of Birth       1940-07-12    Visit Number       [id number]       Age                 83 year(s)    Accession Number   [id number]         Room Number    Corporate ID       [id number]          Sonographer         Erasmo Leventhal,                                                           [place]    Ordering Physician Lacretia Leigh, MD  Interpreting        [facility] Vascular                                       Physician           Darlyn Chamber MD,  FACC   Procedure  Type of Study:    Veins:Lower Extremities DVT Study, VASC EXTREMITY VENOUS DUPLEX BILATERAL.    Vascular Sonographer Report   Additional Indications:Swelliing  Impressions Right Impression Acute partially occluding deep vein thrombosis involving the right set of gastroc veins zone 5-6. No other evidence of deep vein or superficial vein thrombosis involving the right lower extremity. Reflux noted in the right FV. Left Impression No evidence of deep vein or superficial vein thrombosis involving the left lower extremity.  Verbal to Dr. Jeb Levering. Patient released.  Conclusions    Summary    -Acute partially occluding deep vein thrombosis involving the right set of  gastroc veins zone 5-6.  -Reflux noted in the right FV.    Signature    ------------------------------------------------------------------  Electronically signed by Darlyn Chamber MD, [facility] (Interpreting  physician) on 06/27/2014 at 01:38 PM  ------------------------------------------------------------------   Patient Status:Routine. [place] - Vascular Lab. Technical Quality:Adequate visualization.  Velocities are measured in cm/s ; Diameters are measured in mm  Right Lower Extremities DVT Study Measurements Right 2D Measurements  +------------------------+----------+---------------+----------+ !Location                !Visualized!Compressibility!Thrombosis! +------------------------+----------+---------------+----------+ !Sapheno Femoral Junction!Yes       !Yes            !None      ! +------------------------+----------+---------------+----------+ !GSV Thigh               !Yes       !Yes            !None      ! +------------------------+----------+---------------+----------+ !Common Femoral          !Yes       !Yes            !None      ! +------------------------+----------+---------------+----------+ !Prox Femoral            !Yes       !Yes            !None      ! +------------------------+----------+---------------+----------+ !Mid Femoral             !Yes       !Yes            !None      ! +------------------------+----------+---------------+----------+ !Dist Femoral            !Yes       !Yes            !None      ! +------------------------+----------+---------------+----------+ !Deep Femoral            !Yes       !Yes            !None      ! +------------------------+----------+---------------+----------+ !Popliteal               !Yes       !Yes            !None      ! +------------------------+----------+---------------+----------+ !GSV Below Knee          !Yes       !Yes            !None      ! +------------------------+----------+---------------+----------+ !Gastroc                 !Yes       !Partial        !Acute     ! +------------------------+----------+---------------+----------+ !PTV                     !  Yes       !Yes            !None      ! +------------------------+----------+---------------+----------+ !Peroneal                !Yes       !Yes            !None      ! +------------------------+----------+---------------+----------+ !SSV                     !Yes       !Yes            !None      ! +------------------------+----------+---------------+----------+  Right Doppler Measurements  +------------------------+------+------+------------+ !Location                !Signal!Reflux!Reflux (sec)! +------------------------+------+------+------------+ !Sapheno Femoral Junction!Phasic!No    !            ! +------------------------+------+------+------------+ !Common Femoral          !Phasic!No    !            ! +------------------------+------+------+------------+ !Femoral                 !Phasic!Yes   !            ! +------------------------+------+------+------------+ !Deep Femoral            !Phasic!No    !            ! +------------------------+------+------+------------+ !Popliteal               !Phasic!No    !            ! +------------------------+------+------+------------+  Left Lower Extremities DVT Study Measurements Left 2D Measurements +------------------------+----------+---------------+----------+ !Location                !Visualized!Compressibility!Thrombosis! +------------------------+----------+---------------+----------+ !Sapheno Femoral Junction!Yes       !Yes            !None      ! +------------------------+----------+---------------+----------+ !GSV Thigh               !Yes       !Yes            !None      ! +------------------------+----------+---------------+----------+ !Common Femoral          !Yes       !Yes            !None      ! +------------------------+----------+---------------+----------+ !Prox Femoral            !Yes       !Yes            !None      ! +------------------------+----------+---------------+----------+ !Mid Femoral             !Yes       !Yes            !None      ! +------------------------+----------+---------------+----------+ !Dist Femoral            !Yes       !Yes            !None      ! +------------------------+----------+---------------+----------+ !Deep Femoral            !Yes       !Yes            !None      ! +------------------------+----------+---------------+----------+ !Popliteal               !  Yes       !Yes            !None      !  +------------------------+----------+---------------+----------+ !GSV Below Knee          !Yes       !Yes            !None      ! +------------------------+----------+---------------+----------+ !Gastroc                 !Yes       !Yes            !None      ! +------------------------+----------+---------------+----------+ !PTV                     !Yes       !Yes            !None      ! +------------------------+----------+---------------+----------+ !Peroneal                !Yes       !Yes            !None      ! +------------------------+----------+---------------+----------+ !SSV                     !Yes       !Yes            !None      ! +------------------------+----------+---------------+----------+  Left Doppler Measurements +------------------------+------+------+------------+ !Location                !Signal!Reflux!Reflux (sec)! +------------------------+------+------+------------+ !Sapheno Femoral Junction!Phasic!No    !            ! +------------------------+------+------+------------+ !Common Femoral          !Phasic!No    !            ! +------------------------+------+------+------------+ !Femoral                 !Phasic!No    !            ! +------------------------+------+------+------------+ !Deep Femoral            !Phasic!No    !            ! +------------------------+------+------+------------+ !Popliteal               !Phasic!No    !            ! 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

## 2014-07-11 NOTE — Progress Notes (Signed)
0600 lactulose not given . Pt had 2 BM since 8PM

## 2014-07-11 NOTE — Progress Notes (Signed)
Bedside hand off and report complete with barb RN. Pt. Denies further needs at this time, call light is in reach, bed alarm engaged. [name] 3:28 PM

## 2014-07-11 NOTE — Progress Notes (Signed)
Assessment complete. VSS. Patient resting in bed. Respirations even and easy. Call light in reach. Fall precautions in place. No needs expressed at this time. Will continue to monitor.

## 2014-07-11 NOTE — Care Coordination-Inpatient (Signed)
Discharge Planning:  SW spoke with Andrew [name] from [facility], regarding patient. She has submitted for precert with [name]. Awaiting response. If patient is denied for ARU, patient's wife would want to pursue an Appeal.  An appeal would have to be done by Dr. Tasia Catchings, if he feels it is appropriate. Patient will continue to be followed.     Andrew [name] A. [name], MSW,LSW

## 2014-07-12 ENCOUNTER — Encounter: Attending: Hematology & Oncology | Primary: Internal Medicine

## 2014-07-12 LAB — BASIC METABOLIC PANEL
Anion Gap: 7 (ref 3–16)
BUN: 13 mg/dL (ref 7–20)
CO2: 28 mmol/L (ref 21–32)
Calcium: 8 mg/dL — ABNORMAL LOW (ref 8.3–10.6)
Chloride: 104 mmol/L (ref 99–110)
Creatinine: 0.7 mg/dL — ABNORMAL LOW (ref 0.8–1.3)
GFR African American: >60 (ref >60)
GFR Non-African American: >60 (ref >60)
Glucose: 117 mg/dL — ABNORMAL HIGH (ref 70–99)
Potassium: 3.3 mmol/L — ABNORMAL LOW (ref 3.5–5.1)
Sodium: 139 mmol/L (ref 136–145)

## 2014-07-12 LAB — CBC WITH AUTO DIFFERENTIAL
Bands Relative: 1 % (ref 0–7)
Basophils %: 0 %
Basophils Absolute: 0 10*3/uL (ref 0.0–0.2)
Eosinophils %: 0 %
Eosinophils Absolute: 0 10*3/uL (ref 0.0–0.6)
Hematocrit: 27.2 % — ABNORMAL LOW (ref 40.5–52.5)
Hemoglobin: 9.1 g/dL — ABNORMAL LOW (ref 13.5–17.5)
Lymphocytes %: 64 %
Lymphocytes Absolute: 1.6 10*3/uL (ref 1.0–5.1)
MCH: 37.1 pg — ABNORMAL HIGH (ref 26.0–34.0)
MCHC: 33.5 g/dL (ref 31.0–36.0)
MCV: 111 fL — ABNORMAL HIGH (ref 80.0–100.0)
MPV: 8.8 fL (ref 5.0–10.5)
Monocytes %: 6 %
Monocytes Absolute: 0.2 10*3/uL (ref 0.0–1.3)
Neutrophils %: 29 %
Neutrophils Absolute: 0.8 10*3/uL — CL (ref 1.7–7.7)
PLATELET SLIDE REVIEW: DECREASED
Platelets: 48 10*3/uL — ABNORMAL LOW (ref 135–450)
RBC: 2.45 M/uL — ABNORMAL LOW (ref 4.20–5.90)
RDW: 26.9 % — ABNORMAL HIGH (ref 12.4–15.4)
WBC: 2.5 10*3/uL — ABNORMAL LOW (ref 4.0–11.0)

## 2014-07-12 MED ORDER — DARBEPOETIN ALFA 200 MCG/ML IJ SOLN
200 MCG/ML | Freq: Once | INTRAMUSCULAR | Status: AC
Start: 2014-07-12 — End: 2014-07-12
  Administered 2014-07-12: 15:00:00 200 ug via SUBCUTANEOUS

## 2014-07-12 MED FILL — NORMAL SALINE FLUSH 0.9 % IV SOLN: 0.9 % | INTRAVENOUS | Qty: 10

## 2014-07-12 MED FILL — MAGNESIUM OXIDE 400 (241.3 MG) MG PO TABS: 400 (241.3 Mg) MG | ORAL | Qty: 1

## 2014-07-12 MED FILL — LACTULOSE 10 GM/15ML PO SOLN: 10 GM/15ML | ORAL | Qty: 30

## 2014-07-12 MED FILL — ARANESP (ALBUMIN FREE) 200 MCG/ML IJ SOLN: 200 MCG/ML | INTRAMUSCULAR | Qty: 1

## 2014-07-12 MED FILL — FUROSEMIDE 40 MG PO TABS: 40 MG | ORAL | Qty: 2

## 2014-07-12 MED FILL — LOVENOX 80 MG/0.8ML SC SOLN: 80 MG/0.8ML | SUBCUTANEOUS | Qty: 0.8

## 2014-07-12 MED FILL — CARVEDILOL 3.125 MG PO TABS: 3.125 MG | ORAL | Qty: 1

## 2014-07-12 MED FILL — CARVEDILOL 6.25 MG PO TABS: 6.25 MG | ORAL | Qty: 1

## 2014-07-12 MED FILL — CITALOPRAM HYDROBROMIDE 20 MG PO TABS: 20 MG | ORAL | Qty: 1

## 2014-07-12 MED FILL — XIFAXAN 550 MG PO TABS: 550 MG | ORAL | Qty: 1

## 2014-07-12 MED FILL — LISINOPRIL 5 MG PO TABS: 5 MG | ORAL | Qty: 1

## 2014-07-12 MED FILL — EPLERENONE 25 MG PO TABS: 25 MG | ORAL | Qty: 2

## 2014-07-12 NOTE — Plan of Care (Signed)
Problem: Falls - Risk of  Goal: Absence of falls  Outcome: Ongoing  Pt in bed with bed alarm on, instructed to call for assistance. Verbalized understanding. All fall precautions in place.         Problem: Risk for Impaired Skin Integrity  Goal: Tissue integrity - skin and mucous membranes  Structural intactness and normal physiological function of skin and  mucous membranes.   Outcome: Ongoing  Pt to remain free of skin breakdown during stay, pt encouraged to turn and reposition frequently. No evidence of skin breakdown noted.

## 2014-07-12 NOTE — Progress Notes (Signed)
Occupational Therapy  Daily Treatment Note  Date: 07/12/2014    Patient Name: Andrew [name]  RCV:[id number]     DOB: 1940-08-11     Restrictions  Restrictions/Precautions  Restrictions/Precautions: Fall Risk  Required Braces or Orthoses?: No  Position Activity Restriction  Other position/activity restrictions: DVT R LE but therapeutic.   Subjective   General  Chart Reviewed: Yes  Additional Pertinent Hx: HTN, BPH, cirrhosis (non-alcoholic), compression fx, CAD, CHF, DVT, cancer, leukopenia  Response to previous treatment: Patient with no complaints from previous session  Family / Caregiver Present: Yes  Diagnosis: dizzy  Subjective  Subjective: Patient sitting in chair, agreeable to therapy.   Pain Assessment  Patient Currently in Pain: Denies  Vital Signs  Patient Currently in Pain: Denies   Orientation  Orientation  Overall Orientation Status: Within Functional Limits  Objective    ADL  Grooming: Supervision;Verbal cueing  Toileting: Minimal assistance  Transfer: Contact guard assistance  Additional Comments: Patient's wife requested patient to walk, wife stated she performed a shower with patient this AM. Wife tends to speak for patient rather then let patient answer for himself.         Balance  Sitting Balance: Supervision  Standing Balance: Contact guard assistance  Standing Balance  Time: @ 5 minutes x 2  Activity: Ambulation with hand held assist, does best with 2 people. Wife held patient's right hand and OT on left. OT cued patient to look upwards-- tends to look down on floor. Did ambulate @ 15 feet with RW and CGA.   Sit to stand: Stand by assistance  Stand to sit: Stand by assistance  Comment: 2 person hand held assist is best at this time. Patient looks down to floor, protracts shoulders and weight goes forward which increases the risk of falls.   Ecologist - Technique: Ambulating  Equipment Used: Standard toilet  Transfer Level: Stand by Economist Transfers Comments: Grab bar on  right     Transfers  Stand Step Transfers: Stand by assistance  Sit to stand: Stand by assistance  Stand to sit: Stand by assistance                    Vision  Patient Visual Report: No visual complaint reported.  Cognition  Arousal/Alertness: Delayed responses to stimuli (Mild delay when answering questions. )  Following Directions: Follows one step commands  Safety Judgement: Decreased awareness of need for safety  Insights: Decreased awareness of deficits  Sequencing and Organization: Assistance required to generate solutions;Assistance required to implement solutions;Assistance required to identify errors made  Additional Comments: Patient needed several cues for safety but improved sequencing this date with grooming tasks.      Perception  Overall Perceptual Status: WFL               Assessment   Assessment: Decreased functional mobility ;Decreased ADL status;Decreased high-level IADLs;Decreased balance  Comments: Pt is not at baseline level of function and will benefit from skilled OT in order to address the above limitations. Recommend ARU for d/c, however [employer] has denied appeal. WIfe and patient agreeable to SNF-- Patient would need d/c to a SNF if ARU is not accepted.   Treatment Diagnosis: Decreased functional mobility, ADL/IADL status, balance related to dizzy  Rehab Potential: Good  Recommendations: Inpatient Rehab;Patient would benefit from continued therapy after discharge;SNF consult (SNF recommended since ARU denied. )  Goal Formulation: Patient;Family  Requires OT Follow Up: Yes  Total Treatment Time: 24  Timed Code Treatment Minutes: 24 Minutes  Response to Treatment  Response to Treatment: Patient Tolerated treatment well  Safety Devices  Safety Devices in place: Yes  Type of devices: Left in chair;Call light within reach;Nurse notified;Chair alarm in place  Restraints  Initially in place: No  OT D/C Equipment  Equipment Needed: Yes  Shower Chair: to increase safety and independence in bathing           Discharge Recommendations:  Inpatient Rehab, Patient would benefit from continued therapy after discharge, SNF consult (SNF recommended since ARU denied. )     Plan   Plan  Times per week: 2-5x  Times per day: Daily  Patient Education: Role of OT, OT evaluation, safe functional mobility, POC, d/c recommendations       Short term goals  Time Frame for Short term goals: Discharge  Short term goal 1: Pt will be supervision for all UB ADLs.- supervision and cues to sequence grooming task  Short term goal 2: Pt will be supervision for all LB ADLs-- min assist toileting   Short term goal 3: Pt will be SBA for functional mobility with AD as needed-- CGA with RW but gait is best with hand held assist of 2 people   Short term goal 4: Pt will be supervision for functional transfers to/from all ADL surfaces-- CGA and cues needed.   Long term goals  Time Frame for Long term goals : STG=LTG       Sherol Dade, OT  License and Documentation Cosign  Therapy License Number: [id number] Divine. [place], [place]

## 2014-07-12 NOTE — Progress Notes (Signed)
Pt is resting comfortably in chair with wife at bedside. Pt still getting Q4 lactulose and has had three bowel movements today. Call light within reach. Chair alarm activated. Will continue to monitor. Electronically signed by Regis Bill, RN on 07/12/2014 at 2:40 PM

## 2014-07-12 NOTE — Progress Notes (Signed)
Patient resting in bed eyes closed. Respirations even and easy. Call light in reach. Fall precautions in place. Will continue to monitor.

## 2014-07-12 NOTE — Progress Notes (Signed)
Lab called with panic absolute Seg .7. This result is not able to be seen in Epic. Dr Marylou Mccoy notified of this yesterday

## 2014-07-12 NOTE — Progress Notes (Signed)
AM meds given. Assessment completed. VSS. Pt resting comfortably in chair with family at bedside. Chair alarm activated. Call light within reach. Denies further needs at this time. Will continue to monitor. Electronically signed by Regis Bill, RN on 07/12/2014 at 10:46 AM

## 2014-07-12 NOTE — Progress Notes (Signed)
Pt resting comfortably in chair with family at bedside. Call light within reach. Chair alarm activated. Denies further needs at this time. Will continue to monitor. Electronically signed by Regis Bill, RN on 07/12/2014 at 12:24 PM

## 2014-07-12 NOTE — Progress Notes (Signed)
[facility] FOLLOW-UP:     Primary Oncologist: Dr. Eulis Canner  Date: 07/12/2014    PCP: Alecia Lemming, DO  Referring Provider: No ref. provider found    PROBLEM LIST:     Patient Active Problem List   Diagnosis   ??? Erectile dysfunction   ??? Thrombocytopenia   ??? Edema   ??? BPH (benign prostatic hyperplasia)   ??? Alpha-1-antitrypsin deficiency   ??? Cirrhosis, nonalcoholic ([place])   ??? Mild CAD   ??? Chronic systolic CHF (congestive heart failure) ([place])   ??? Non-ischemic cardiomyopathy ([place])   ??? Lumbar compression fracture ([place])   ??? Chronic low back pain   ??? Osteopenia   ??? Coagulopathy ([place])   ??? Abnormal EKG   ??? Visual changes   ??? Fixed pupil of right eye   ??? Anemia   ??? Hemispheric carotid artery syndrome   ??? CAD in native artery   ??? Hyperlipidemia   ??? 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 Bronchiectasis without complication ([place])   ??? Benign essential HTN   ??? Hepatic encephalopathy (HCC)   ??? Ataxia   ??? Pancytopenia ([place])   ??? DVT (deep venous thrombosis) [facility])       Specialty Problems     None          INTERVAL HISTORY     Feeling about the same.     REVIEW OF SYSTEMS:     Review of Systems - General ROS: positive for  - fatigue  negative for - chills or fever  Respiratory ROS: no cough, shortness of breath, or wheezing  Cardiovascular ROS: no chest pain or dyspnea on exertion  Gastrointestinal ROS: no abdominal pain, change in bowel habits, or black or bloody stools  Genito-Urinary ROS: no dysuria, trouble voiding, or hematuria    PHYSICAL EXAM:     BP 116/76 mmHg   Pulse 80   Temp(Src) 98.1 ??F (36.7 ??C) (Oral)   Resp 16   Ht 5\' 5"  (1.651 m)   Wt 177 lb 4.8 oz (80.423 kg)   BMI 29.50 kg/m2   SpO2 93%    WEIGHT:   Wt Readings from Last 3 Encounters:   07/12/14 177 lb 4.8 oz (80.423 kg)   07/03/14 180 lb (81.647 kg)   06/26/14 185 lb (83.915 kg)     GENERAL APPEARANCE: alert and cooperative  HEAD: Normocephalic, without obvious abnormality, atraumatic  NECK: No palpable lymphadenopathy in supraclavicular or cervical chains  LUNGS: Clear  to auscultation bilaterally, no audible rales, wheezes or crackles  HEART: Regular rate and rhythm, S1, S2 normal  ABDOMEN: Soft, non-tender; bowel sounds normal; no masses, no organomegaly  EXTREMITIES: without cyanosis, clubbing, or asymmetry. Leg edema.  SKIN: No jaundice, purpura or petechiae    LABS:     CBC:  Lab Results   Component Value Date    WBC 2.5* 07/12/2014    HGB 9.1* 07/12/2014    HCT 27.2* 07/12/2014    MCV 111.0* 07/12/2014    PLT 48* 07/12/2014    LYMPHOPCT 64.0 07/12/2014    RBC 2.45* 07/12/2014    MCH 37.1* 07/12/2014    MCHC 33.5 07/12/2014    RDW 26.9* 07/12/2014       Lab Results   Component Value Date    NA 139 07/12/2014    K 3.3* 07/12/2014    CL 104 07/12/2014    CO2 28 07/12/2014    BUN 13 07/12/2014    CREATININE 0.7* 07/12/2014  GLUCOSE 117* 07/12/2014    CALCIUM 8.0* 07/12/2014    PROT 5.5* 07/09/2014    LABALBU 1.9* 07/09/2014    BILITOT 1.1* 07/09/2014    ALKPHOS 129 07/09/2014    AST 35 07/09/2014    ALT 20 07/09/2014    LABGLOM >60 07/12/2014    GFRAA >60 07/12/2014    AGRATIO 0.6* 05/03/2014    GLOB 3.9 05/03/2014       No results found for: PTINR    TUMOR MARKERS:    Lab Results   Component Value Date    PSA 0.55 08/15/2013         IMAGING:     Ct Head Wo Contrast    07/07/2014   EXAMINATION: CT OF THE HEAD WITHOUT CONTRAST  07/07/2014 11:11 pm  TECHNIQUE: CT of the head was performed without the administration of intravenous contrast.  COMPARISON: 04/01/2014  HISTORY: ORDERING SYSTEM PROVIDED HISTORY: dizzy TECHNOLOGIST PROVIDED HISTORY: Ordering Physician Provided Reason for Exam: dizzy  FINDINGS: BRAIN/VENTRICLES: There is no acute intracranial hemorrhage, mass effect or midline shift.  No abnormal extra-axial fluid collection.  The gray-white differentiation is maintained without evidence of an acute infarct.  There is no evidence of hydrocephalus.  ORBITS: The visualized portion of the orbits demonstrate no acute abnormality.  SINUSES: There is minimal mucosal thickening in  the left maxillary sinus. The remaining visualized paranasal sinuses are clear.  The mastoid air cells are unremarkable.  SOFT TISSUES/SKULL:  No acute abnormality of the visualized skull or soft tissues.     07/07/2014   IMPRESSION: No acute intracranial abnormality.     Ct Chest W Contrast    06/13/2014   EXAMINATION: CT OF THE CHEST WITH CONTRAST 06/13/2014 3:56 pm  TECHNIQUE: CT of the chest was performed with the administration of intravenous contrast. Multiplanar reformatted images are provided for review.  COMPARISON: None.  HISTORY: Bacterial pneumonia, unspecified  FINDINGS: Mediastinum:  *A lymph node is seen pretracheal upper mediastinum interposed between the proximal left common carotid artery and brachiocephalic artery, image 31. This measures approximately 12 mm.  Subcarinal density possible subcarinal adenopathy, difficult to separate from adjacent fluid. *While there is no significant cardiac enlargement, there is chamber enlargement, left ventricle and left atrium.  This could indicate mitral valve disease.  Coronary calcification is present. *There is mild coronary calcification. *Vasculature is unremarkable for age. Lungs/pleura:  *Large pleural effusion is seen on the right and small effusion on the left. Multifocal patchy areas of airspace disease are seen bilaterally both upper and lower lobes.  Right lower lobe has a somewhat bronchus centric distribution.  There is bronchiectasis bilaterally.  Focal area of consolidation is seen, right upper lobe anteriorly. *Airways are unremarkable. Soft Tissues/Bones: Bilateral gynecomastia is present.  Small nodular lesion projects from the inferior right thyroid gland.  Sagittal projection demonstrates multiple compression fractures L1 moderate to severe, L2 moderate, T9 moderate and T10 minimal.  Bones are demineralized.  These fractures are faintly noted on prior lateral chest radiograph.  Upper Abdomen: The liver is grossly abnormal, with small overall  size, increased density and extensive nodularity throughout the serosal surface. The spleen is mildly enlarged.  There is are extensive unenhanced splenic varices.  Multiple unenhanced gastric and paraesophageal varices are also noted.  Small right renal calculus present     06/13/2014   IMPRESSION: Multifocal airspace disease most likely pneumonia with large pleural effusion primarily on the right.  Chronic bronchiectasis.  Left ventricular and atrial chamber enlargement could indicate  mitral valve disease.  Advanced cirrhotic morphology of the liver with portal hypertension and extensive varices, unenhanced on current CT.  Multiple compression fractures essentially unchanged.  Gynecomastia.  Multiple prior moderate compression fractures.     US Abdomen Complete    07/08/2014   EXAMINATION: COMPLETE ABDOMINAL ULTRASOUND 07/08/2014 6:04 pm  COMPARISON: None  HISTORY: ORDERING SYSTEM PROVIDED HISTORY: CIRRHOSIS TECHNOLOGIST PROVIDED HISTORY: Ordering Physician Provided Reason for Exam: cirrhosis, r/o ascites Acuity: Acute Type of Exam: Initial  FINDINGS: Study is limited by body habitus.  LIVER: Evaluation of the liver is limited.  There is diffuse increased echogenicity with no focal lesion noted.  Somewhat nodular contour of the liver is compatible with known cirrhosis.  BILIARY SYSTEM: Limited imaging of the gallbladder is grossly unremarkable.  Common bile duct is within normal limits measuring 6 mm.  KIDNEYS: Right kidney is nonvisualized.  Left kidney measures 10.1 x 4.9 x 5.3 cm and is grossly unremarkable.  PANCREAS: Pancreas is nonvisualized.  SPLEEN: Spleen measures 10.1 cm.  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  IVC: The IVC is patent.  AORTA: Aorta is obscured.  OTHER: No evidence of ascites.     07/08/2014   IMPRESSION: Limited study as above.  Cirrhosis and portal hypertension.  No evidence of ascites.     Xr Chest Portable    07/08/2014   EXAMINATION: SINGLE VIEW OF THE CHEST  07/07/2014 11:34 pm  COMPARISON: 06/03/2014   HISTORY: ORDERING SYSTEM PROVIDED HISTORY: other TECHNOLOGIST PROVIDED HISTORY: Ordering Physician Provided Reason for Exam: dizziness Acuity: Unknown Type of Exam: Unknown  FINDINGS: Cardiac leads project over the chest.  Diffuse interstitial opacity is demonstrated bilaterally.  Heterogeneous opacity is seen adjacent to the left heart border.  No evidence of pleural effusion.  Negative for pneumothorax. Cardiac and mediastinal silhouettes are similar to prior.  Old posterior left 7th rib fracture.     07/08/2014   IMPRESSION: Mild pulmonary edema.     Vl Extremity Venous Right    07/09/2014   Vascular Lower Extremities DVT Study Procedure  -- PRELIMINARY SONOGRAPHER REPORT --    Demographics    Patient Name      TERRYON PINEIRO    Date of Study     07/09/2014          Gender              Male    Patient Number    [id number]          Date of Birth       1940/06/18    Visit Number      [id number]         Age                 74 year(s)    Accession Number  [id number]           Room Number         6967    Corporate ID      [id number]            Harrison, Furman,                                                            [place]    Ordering  Deniece Portela,   Interpreting        [facility]  Physician         MD                  Physician   Procedure  Type of Study:    Veins:Lower Extremities DVT Study, VL EXTREMITY VENOUS DUPLEX RIGHT.    Generic Orders:VL EXTREMITY VENOUS DUPLEX RIGHT.   Tech Comments Right Acute totally occluding deep vein thrombosis involving the right proximal PTV and one gastroc vein zone 5-6. Acute partially occluding deep vein thrombosis involving the popliteal vein and right one gastroc vein zone 5-6. No other evidence of deep vein or superficial vein thrombosis involving the right lower extremity and the left common femoral vein. Calf veins were not well visualized on the right due to patient positioning and edema.  Verbal to [employer].    Cta Pulmonary With  Contrast    06/27/2014   EXAMINATION: CTA OF THE CHEST 06/27/2014 2:53 pm  TECHNIQUE: CTA of the chest was performed after the administration of intravenous contrast.  Multiplanar reformatted images are provided for review.  MIP images are provided for review.  Patient received 100 mL Isovue 370 intravenous contrast.  COMPARISON: CT chest with contrast June 13, 2014.  HISTORY: ORDERING SYSTEM PROVIDED HISTORY: DVT (deep venous thrombosis), unspecified laterality [facility]) TECHNOLOGIST PROVIDED HISTORY: Ordering Physician Provided Reason for Exam: DVT, SOB Injury/Trauma or Illness: Illness/Other Acuity: Acute Type of Exam: Initial Relevant Medica/Surgical History: CHF, cardiomyopathy  FINDINGS: Pulmonary Arteries: Pulmonary arteries are adequately opacified for evaluation.  No evidence of intraluminal filling defect to suggest pulmonary embolism.  Main pulmonary artery is normal in caliber.  Mediastinum: No evidence of mediastinal lymphadenopathy.  Heart is enlarged, similar in appearance.  The heart and pericardium demonstrate no acute abnormality.  There is no acute abnormality of the thoracic aorta. Nodularity of thyroid similar in appearance.  Lungs/pleura: The central airways are patent.  There is a moderate right-sided pleural effusion which appears slightly decreased compared to prior study.  There is mild patchy dependent opacities in the right upper lobe, similar in appearance.  There are dependent subpleural opacities within the lingula and left lower lobe.  There is mild opacity within the right middle lobe which is linear in appearance in appears slightly improved compared to prior study.  2 nodular appearing densities are again seen in the right lower lobe on image number 65, unchanged.  No pneumothorax.  Upper Abdomen: Cirrhotic liver with mildly distended gallbladder is similar in appearance to prior study.  Soft Tissues/Bones: Multiple compression fractures again seen, most pronounced at the thoracolumbar  junction, similar in appearance.  Old right-sided rib fractures are also seen.  No acute bone or soft tissue abnormality.  Gynecomastia is again seen.     06/27/2014   IMPRESSION: No evidence of pulmonary embolism.  Moderate right pleural effusion, minimally decreased.  Persistent patchy areas of airspace opacity which may be related to atelectasis versus pneumonia, slightly improved.  2 nodular appearing densities are seen within the right lower lobe which may be related to the airspace disease or represent lung nodules.  Follow-up recommended with dedicated chest CT in 3 months.  Remote compression fractures again seen.  Cirrhotic liver.     Vl Extremity Venous Bilateral    07/09/2014   Vascular Lower Extremities DVT Study Procedure  -- PRELIMINARY SONOGRAPHER REPORT --    Demographics    Patient Name      MAGES  Tyronn    Date of Study     07/09/2014          Gender              Male    Patient Number    [id number]          Date of Birth       1940-06-25    Visit Number      [id number]         Age                 78 year(s)    Accession Number  [id number]           Room Number         5784    Corporate ID      [id number]            Sonographer         Erasmo Leventhal,                                                            RVS    Ordering          Deniece Portela,   Interpreting        [facility]  Physician         MD                  Physician   Procedure  Type of Study:    Veins:Lower Extremities DVT Study, VL EXTREMITY VENOUS DUPLEX RIGHT.   Tech Comments Right Acute totally occluding deep vein thrombosis involving the right proximal PTV and one gastroc vein zone 5-6. Acute partially occluding deep vein thrombosis involving the popliteal vein and right one gastroc vein zone 5-6. No other evidence of deep vein or superficial vein thrombosis involving the right lower extremity and the left common femoral vein. Calf veins were not well visualized on the right due to patient positioning and edema.  verbal to  [employer].    Vl Extremity Venous Bilateral    06/27/2014   Lower Extremities DVT Study   Demographics    Patient Name       MAKANI SECKMAN    Date of Study      06/27/2014        Gender              Male    Patient Number     [id number]        Date of Birth       18-Aug-1940    Visit Number       [id number]       Age                 32 year(s)    Accession Number   [id number]         Room Number    Corporate ID       [id number]          Sonographer         Erasmo Leventhal,  RVS    Ordering Physician Lacretia Leigh, MD  Interpreting        [facility]                                       Physician           Darlyn Chamber MD,                                                           [facility]   Procedure  Type of Study:    Veins:Lower Extremities DVT Study, VASC EXTREMITY VENOUS DUPLEX BILATERAL.    Vascular Sonographer Report   Additional Indications:Swelliing  Impressions Right Impression Acute partially occluding deep vein thrombosis involving the right set of gastroc veins zone 5-6. No other evidence of deep vein or superficial vein thrombosis involving the right lower extremity. Reflux noted in the right FV. Left Impression No evidence of deep vein or superficial vein thrombosis involving the left lower extremity.  Verbal to Dr. Jeb Levering. Patient released.  Conclusions    Summary    -Acute partially occluding deep vein thrombosis involving the right set of  gastroc veins zone 5-6.  -Reflux noted in the right FV.    Signature    ------------------------------------------------------------------  Electronically signed by Darlyn Chamber MD, [facility] (Interpreting  physician) on 06/27/2014 at 01:38 PM  ------------------------------------------------------------------   Patient Status:Routine. [place] - V[facility] Lab. Technical Quality:Adequate visualization.  Velocities are measured in cm/s ; Diameters are measured in mm  Right Lower  Extremities DVT Study Measurements Right 2D Measurements    STAGING:     No matching staging information was found for the patient.    ASSESSMENT AND PLAN:     1. Right LE DVT.  Continue therapeutic Lovenox for total 2 weeks then transition to coumadin with target INR about 2.  2. Pancytopenia.  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    3. Macrocytosis.     Explained to the wife at great length about his condition.  It is unclear if he truly has MDS.  Await cytogenetics.  Will start ESA first.  Continue to watch counts.  If worse may need to repeat bone marrow in the future if cytogenetics this time not helpful.  Discussed with Dr. Tasia Catchings.    Eulis Canner, M.D.; M.S.  Medical Oncology/Hematology  Phone: [phone]  Fax: [phone]    [facility]  [address] [place] [address] [place] [address]    [place] [address]    [place] [address]

## 2014-07-12 NOTE — Progress Notes (Signed)
Bedside handoff completed with  Daiva Huge and Romelle Starcher RN

## 2014-07-12 NOTE — Progress Notes (Signed)
Pt in bed, pt and wife both refused Lactulose at this time, stated that pt had at least 5 BM's. Will monitor.

## 2014-07-12 NOTE — Progress Notes (Addendum)
Andrew [name]  Gastroenterology Progress Note    Andrew [name] is a 74 y.o. male patient.  Principal Problem:    Hepatic encephalopathy (HCC)  Active Problems:    Cirrhosis, nonalcoholic (HCC)    Hyperlipidemia    Benign essential HTN    Ataxia    Pancytopenia (HCC)    DVT (deep venous thrombosis) (HCC)      SUBJECTIVE:   Had 3 BMs yesterday and one today so far.     ROS:   No fever, chills  No chest pain, palpitations  No SOB, cough    Physical    VITALS:  BP 116/76 mmHg   Pulse 80   Temp(Src) 98.1 ??F (36.7 ??C) (Oral)   Resp 16   Ht 5\' 5"  (1.651 m)   Wt 177 lb 4.8 oz (80.423 kg)   BMI 29.50 kg/m2   SpO2 93%  TEMPERATURE:  Current - Temp: 98.1 ??F (36.7 ??C); Max - Temp  Avg: 98.2 ??F (36.8 ??C)  Min: 98.1 ??F (36.7 ??C)  Max: 98.4 ??F (36.9 ??C)    NAD   Regular rate   Lungs CTA Bilaterally  Abdomen soft, ND, NT,  Bowel sounds normal.  No asterixis, A&Ox3    Data    Data Review:    Recent Labs      07/10/14   0526  07/11/14   0527  07/12/14   0445   WBC  1.9*  2.5*  2.5*   HGB  7.7*  8.9*  9.1*   HCT  23.0*  26.1*  27.2*   MCV  119.5*  109.1*  111.0*   PLT  49*  47*  48*     Recent Labs      07/10/14   0527  07/11/14   0527  07/12/14   0445   NA  145  143  139   K  3.6  3.6  3.3*   CL  110  108  104   CO2  28  29  28    BUN  12  12  13    CREATININE  0.8  0.5*  0.7*     No results for input(s): AST, ALT, ALB, BILIDIR, BILITOT, ALKPHOS in the last 72 hours.  No results for input(s): LIPASE, AMYLASE in the last 72 hours.  No results for input(s): PROTIME, INR in the last 72 hours.  No results for input(s): PTT in the last 72 hours.    Abdominal US: 07/08/14  IMPRESSION:   Limited study as above.      Cirrhosis and portal hypertension.      No evidence of ascites.         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 Followed by Dr. Einar Grad. No signs of [name] bleeding. No ascites on [place].   Hepatic encephalopathy - No [name] bleeding or AKI which can precipitate HE. No PNA on CXR, although CXR c/w pulmonary edema. UA trace leuk esterase but urine  cx with < 25,000 Enterococcus. No ascites. Suspect encephalopathy was from taking less lactulose at home. He is alert today without asterixis. HE resolved.   Macrocytic anemia - no signs of [name] bleed. Negative EGD and colonoscopy Nov 2015. No iron, folate, or B12 deficiency. Heme following. 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 Received 2 U PRBC on 5/4.  Pancytopenia - heme following. 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  Acute DVT - dopplers with completely occluding RLE DVT (was paritally occlusive in April) and acute DVT RLE. Started on lovenox.  PLAN :  - continue Xifaxan BID  - lactulose TID and titrate frequency to ensure pt is having 3 BMs daily     Discussed with Dr. Cottie Banda, PA-C  Irene  I have personally performed a face to face diagnostic evaluation on this patient.  I have interviewed and examined the patient and I agree with the findings and recommended plan of care.  In summary, my findings and plan are the following: More alert today and no asterixis on exam.  He is on q 4 h lactulose that can be decreased to 3 or 4 doses daily -- to achieve 3 BM's/day.  Can discharge from [name] standpoint.     Will follow from a distance.  Please call with questions.  He should call for f/u appt with Dr. Haze Rushing after discharge.    Norwood Levo, MD  [place] [facility] [place]  07/12/2014

## 2014-07-12 NOTE — Progress Notes (Signed)
Pt resting comfortably in chair with family at bedside. Pt has had 4 BMs today. Call light within reach. Chair alarm activated. Denies further needs at this time. Will continue to monitor.

## 2014-07-12 NOTE — Progress Notes (Signed)
Progress Note - Dr. Tasia Catchings - Internal Medicine  PCP: Alecia Lemming, DO [place] / [place] [address] [phone]    Hospital Day: 4  Code Status: Full Code  Current Diet: DIET GENERAL; No Added Salt (3-4 GM)        CC: follow up on medical issues    Subjective:   Dalessandro Baldyga is a 74 y.o. male.    He denies problems    Doing ok  D/w family  Awaiting [employer] decision on ARU    If this is denied, i am not sure what I would add to an appeal other than what is in the chart      He denies chest pain, denies shortness of breath, denies nausea,  denies emesis.    10 system Review of Systems is reviewed with patient, and pertinent positives are listed here: None . Otherwise, Review of systems is negative.     I have reviewed the patient's medical and social history in detail and updated the computerized patient record.  To recap: He  has a past medical history of Hypertension; Kidney stones; Alpha 1 antitrypsin deficiency; Thrombocytopenia ([place]); BPH (benign prostatic hyperplasia); Cirrhosis, nonalcoholic ([place]); Chronic systolic CHF (congestive heart failure) ([place]); CAD (coronary artery disease); Compression fracture of lumbar vertebra ([place]); Leukopenia; Anemia; Pneumonia; DVT (deep venous thrombosis) ([place]); Cancer ([place]); and Hyperlipidemia.. He  has past surgical history that includes AV fistula repair (1970's); Colonoscopy (4/12); Cardiac catheterization; eye surgery (04/12/2014); and Cosmetic surgery (04/2013).[name] He  reports that he has never smoked. He has never used smokeless tobacco. He reports that he does not drink alcohol or use illicit drugs..        Active Hospital Problems    Diagnosis Date Noted   ??? DVT (deep venous thrombosis) ([place]) [I82.409] 07/10/2014   ??? Hepatic encephalopathy ([place]) [K72.90] 07/07/2014   ??? Ataxia [R27.0] 07/07/2014   ??? Pancytopenia ([place]) [D61.818] 07/07/2014   ??? Benign essential HTN [I10] 07/03/2014   ??? Hyperlipidemia [E78.5]    ??? Cirrhosis, nonalcoholic ([facility]) [Y78.29]         Current facility-administered medications: enoxaparin (LOVENOX) injection 80 mg, 1 mg/kg, Subcutaneous, BID  furosemide (LASIX) injection 20 mg, 20 mg, Intravenous, See Admin Instructions  diphenhydrAMINE (BENADRYL) tablet 25 mg, 25 mg, Oral, See Admin Instructions  rifaximin (XIFAXAN) tablet 550 mg, 550 mg, Oral, BID  magnesium oxide (MAG-OX) tablet 400 mg, 400 mg, Oral, BID  citalopram (CELEXA) tablet 10 mg, 10 mg, Oral, Daily  furosemide (LASIX) tablet 80 mg, 80 mg, Oral, BID  potassium chloride SA (K-DUR;KLOR-CON M) tablet 40 mEq, 40 mEq, Oral, PRN **OR** potassium chloride 20 MEQ/15ML (10%) oral solution 40 mEq, 40 mEq, Oral, PRN **OR** potassium chloride 10 mEq/100 mL IVPB (Peripheral Line), 10 mEq, Intravenous, PRN  sodium chloride flush 0.9 % injection 10 mL, 10 mL, Intravenous, 2 times per day  sodium chloride flush 0.9 % injection 10 mL, 10 mL, Intravenous, PRN  acetaminophen (TYLENOL) tablet 650 mg, 650 mg, Oral, Q4H PRN  HYDROcodone-acetaminophen (NORCO) 5-325 MG per tablet 1 tablet, 1 tablet, Oral, Q4H PRN **OR** HYDROcodone-acetaminophen (NORCO) 5-325 MG per tablet 2 tablet, 2 tablet, Oral, Q4H PRN  morphine (PF) injection 2 mg, 2 mg, Intravenous, Q2H PRN **OR** morphine (PF) injection 4 mg, 4 mg, Intravenous, Q2H PRN  magnesium hydroxide (MILK OF MAGNESIA) 400 MG/5ML suspension 30 mL, 30 mL, Oral, Daily PRN  ondansetron (ZOFRAN) injection 4 mg, 4 mg, Intravenous, Q6H PRN  famotidine (PEPCID) tablet 20  mg, 20 mg, Oral, BID PRN  eplerenone (INSPRA) tablet 50 mg, 50 mg, Oral, Daily  lisinopril (PRINIVIL;ZESTRIL) tablet 5 mg, 5 mg, Oral, Nightly  carvedilol (COREG) tablet 3.125 mg, 3.125 mg, Oral, Daily with breakfast **AND** carvedilol (COREG) tablet 6.25 mg, 6.25 mg, Oral, QPM  diclofenac (VOLTAREN) 0.1 % ophthalmic solution 1 drop, 1 drop, Both Eyes, Daily  lactulose (CHRONULAC) 10 GM/15ML solution 20 g, 20 g, Oral, Q4H         Objective:  BP 116/76 mmHg   Pulse 80   Temp(Src) 98.1 ??F (36.7 ??C)  (Oral)   Resp 16   Ht 5\' 5"  (1.651 m)   Wt 177 lb 4.8 oz (80.423 kg)   BMI 29.50 kg/m2   SpO2 93%     Patient Vitals for the past 24 hrs:   BP Temp Temp src Pulse Resp SpO2 Weight   07/12/14 0634 - - - - - - 177 lb 4.8 oz (80.423 kg)   07/12/14 0632 116/76 mmHg 98.1 ??F (36.7 ??C) - 80 16 93 % -   07/12/14 0201 116/71 mmHg 98.1 ??F (36.7 ??C) - 74 16 91 % -   07/11/14 2153 117/71 mmHg 98.3 ??F (36.8 ??C) - 71 16 94 % -   07/11/14 1630 119/73 mmHg 98.3 ??F (36.8 ??C) Oral 71 16 94 % -   07/11/14 1321 108/55 mmHg 98.4 ??F (36.9 ??C) Oral 68 16 92 % -   07/11/14 1245 - - - - 16 92 % -     Patient Vitals for the past 96 hrs (Last 3 readings):   Weight   07/12/14 0634 177 lb 4.8 oz (80.423 kg)   07/11/14 0702 175 lb 14.4 oz (79.788 kg)   07/10/14 0517 178 lb (80.74 kg)           Intake/Output Summary (Last 24 hours) at 07/12/14 0818  Last data filed at 07/11/14 1630   Gross per 24 hour   Intake   1350 ml   Output      0 ml   Net   1350 ml         Physical Exam:   S1, S2 normal, no murmur, rub or gallop, regular rate and rhythm  clear to auscultation bilaterally  abdomen is soft without significant tenderness, masses, organomegaly or guarding  extremities normal, atraumatic, no cyanosis or edema    Labs:  Lab Results   Component Value Date    WBC 2.5* 07/12/2014    HGB 9.1* 07/12/2014    HCT 27.2* 07/12/2014    PLT 48* 07/12/2014    CHOL 101 04/02/2014    TRIG 83 04/02/2014    HDL 22* 04/02/2014    ALT 20 07/09/2014    AST 35 07/09/2014    NA 139 07/12/2014    K 3.3* 07/12/2014    CL 104 07/12/2014    CREATININE 0.7* 07/12/2014    BUN 13 07/12/2014    CO2 28 07/12/2014    TSH 2.15 08/15/2013    PSA 0.55 08/15/2013    INR 1.28* 07/07/2014    LABA1C 5.0 01/10/2014    LABMICR Not Indicated 07/08/2014     Lab Results   Component Value Date    TROPONINI <0.01 07/07/2014       Recent Imaging Results are Reviewed:  Ct Head Wo Contrast    07/07/2014   EXAMINATION: CT OF THE HEAD WITHOUT CONTRAST  07/07/2014 11:11 pm  TECHNIQUE: CT of the head  was performed without the  administration of intravenous contrast.  COMPARISON: 04/01/2014  HISTORY: ORDERING SYSTEM PROVIDED HISTORY: dizzy TECHNOLOGIST PROVIDED HISTORY: Ordering Physician Provided Reason for Exam: dizzy  FINDINGS: BRAIN/VENTRICLES: There is no acute intracranial hemorrhage, mass effect or midline shift.  No abnormal extra-axial fluid collection.  The gray-white differentiation is maintained without evidence of an acute infarct.  There is no evidence of hydrocephalus.  ORBITS: The visualized portion of the orbits demonstrate no acute abnormality.  SINUSES: There is minimal mucosal thickening in the left maxillary sinus. The remaining visualized paranasal sinuses are clear.  The mastoid air cells are unremarkable.  SOFT TISSUES/SKULL:  No acute abnormality of the visualized skull or soft tissues.     07/07/2014   IMPRESSION: No acute intracranial abnormality.     Ct Chest W Contrast    06/13/2014   EXAMINATION: CT OF THE CHEST WITH CONTRAST 06/13/2014 3:56 pm  TECHNIQUE: CT of the chest was performed with the administration of intravenous contrast. Multiplanar reformatted images are provided for review.  COMPARISON: None.  HISTORY: Bacterial pneumonia, unspecified  FINDINGS: Mediastinum:  *A lymph node is seen pretracheal upper mediastinum interposed between the proximal left common carotid artery and brachiocephalic artery, image 31. This measures approximately 12 mm.  Subcarinal density possible subcarinal adenopathy, difficult to separate from adjacent fluid. *While there is no significant cardiac enlargement, there is chamber enlargement, left ventricle and left atrium.  This could indicate mitral valve disease.  Coronary calcification is present. *There is mild coronary calcification. *Vasculature is unremarkable for age. Lungs/pleura:  *Large pleural effusion is seen on the right and small effusion on the left. Multifocal patchy areas of airspace disease are seen bilaterally both upper and lower  lobes.  Right lower lobe has a somewhat bronchus centric distribution.  There is bronchiectasis bilaterally.  Focal area of consolidation is seen, right upper lobe anteriorly. *Airways are unremarkable. Soft Tissues/Bones: Bilateral gynecomastia is present.  Small nodular lesion projects from the inferior right thyroid gland.  Sagittal projection demonstrates multiple compression fractures L1 moderate to severe, L2 moderate, T9 moderate and T10 minimal.  Bones are demineralized.  These fractures are faintly noted on prior lateral chest radiograph.  Upper Abdomen: The liver is grossly abnormal, with small overall size, increased density and extensive nodularity throughout the serosal surface. The spleen is mildly enlarged.  There is are extensive unenhanced splenic varices.  Multiple unenhanced gastric and paraesophageal varices are also noted.  Small right renal calculus present     06/13/2014   IMPRESSION: Multifocal airspace disease most likely pneumonia with large pleural effusion primarily on the right.  Chronic bronchiectasis.  Left ventricular and atrial chamber enlargement could indicate mitral valve disease.  Advanced cirrhotic morphology of the liver with portal hypertension and extensive varices, unenhanced on current CT.  Multiple compression fractures essentially unchanged.  Gynecomastia.  Multiple prior moderate compression fractures.     US Abdomen Complete    07/08/2014   EXAMINATION: COMPLETE ABDOMINAL ULTRASOUND 07/08/2014 6:04 pm  COMPARISON: None  HISTORY: ORDERING SYSTEM PROVIDED HISTORY: CIRRHOSIS TECHNOLOGIST PROVIDED HISTORY: Ordering Physician Provided Reason for Exam: cirrhosis, r/o ascites Acuity: Acute Type of Exam: Initial  FINDINGS: Study is limited by body habitus.  LIVER: Evaluation of the liver is limited.  There is diffuse increased echogenicity with no focal lesion noted.  Somewhat nodular contour of the liver is compatible with known cirrhosis.  BILIARY SYSTEM: Limited imaging of the  gallbladder is grossly unremarkable.  Common bile duct is within normal limits measuring 6 mm.  KIDNEYS:  Right kidney is nonvisualized.  Left kidney measures 10.1 x 4.9 x 5.3 cm and is grossly unremarkable.  PANCREAS: Pancreas is nonvisualized.  SPLEEN: Spleen measures 10.1 cm.  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  IVC: The IVC is patent.  AORTA: Aorta is obscured.  OTHER: No evidence of ascites.     07/08/2014   IMPRESSION: Limited study as above.  Cirrhosis and portal hypertension.  No evidence of ascites.     Xr Chest Portable    07/08/2014   EXAMINATION: SINGLE VIEW OF THE CHEST  07/07/2014 11:34 pm  COMPARISON: 06/03/2014  HISTORY: ORDERING SYSTEM PROVIDED HISTORY: other TECHNOLOGIST PROVIDED HISTORY: Ordering Physician Provided Reason for Exam: dizziness Acuity: Unknown Type of Exam: Unknown  FINDINGS: Cardiac leads project over the chest.  Diffuse interstitial opacity is demonstrated bilaterally.  Heterogeneous opacity is seen adjacent to the left heart border.  No evidence of pleural effusion.  Negative for pneumothorax. Cardiac and mediastinal silhouettes are similar to prior.  Old posterior left 7th rib fracture.     07/08/2014   IMPRESSION: Mild pulmonary edema.     Vl Extremity Venous Right    07/10/2014   Lower Extremities DVT Study   Demographics    Patient Name      ACEA YAGI    Date of Study     07/09/2014          Gender              Male    Patient Number    [id number]          Date of Birth       12-16-1940    Visit Number      [id number]         Age                 63 year(s)    Accession Number  [id number]           Room Number         0626    Corporate ID      [id number]            Sonographer         Erasmo Leventhal,                                                            RVS    Ordering          Deniece Portela,   Interpreting        [facility]  Physician         MD                  Physician           Peggyann Shoals,                                                            MD, [facility], [place]    Procedure  Type of Study:    Veins:Lower Extremities DVT Study, VL EXTREMITY VENOUS DUPLEX RIGHT.    Generic Orders:VL EXTREMITY VENOUS DUPLEX RIGHT.  Vascular Sonographer Report   Additional Indications:Swelling  Impressions Right Impression Acute totally occluding deep vein thrombosis involving the right proximal PTV and one gastroc vein zone 5-6. Acute partially occluding deep vein thrombosis involving the popliteal vein and right one gastroc vein zone 5-6. No other evidence of deep vein or superficial vein thrombosis involving the right lower extremity and the left common femoral vein. Calf veins were not well visualized on the right due to patient positioning and edema.  Verbal to [employer].  Conclusions    Summary    Acute totally occluding deep vein thrombosis involving the right proximal  PTV and one gastroc vein zone 5-6.  Acute partially occluding deep vein thrombosis involving the popliteal vein  and right one gastroc vein zone 5-6.  Calf veins were not well visualized on the right due to patient positioning  and edema.    Signature    ------------------------------------------------------------------  Electronically signed by Peggyann Shoals, MD, [facility], [facility]  (Interpreting physician) on 07/10/2014 at 11:34 AM  ------------------------------------------------------------------   Patient Status:Routine. [place] - Vascular Lab. Technical Quality:Poor visualization.  Risk Factors History +------------------+----------+----------------------------------------------+ !Diagnosis         !Date      !Comments                                      ! +------------------+----------+----------------------------------------------+ !Previous Procedure!06/27/2014!Venous Doppler Bilateral: Acute DVT right     ! !                  !          !gastroc veins.                                ! +------------------+----------+----------------------------------------------+  Velocities are  measured in cm/s ; Diameters are measured in mm  Right Lower Extremities DVT Study Measurements Right 2D Measurements +------------------------+----------+---------------+----------+ !Location                !Visualized!Compressibility!Thrombosis! +------------------------+----------+---------------+----------+ !Sapheno Femoral Junction!Yes       !Yes            !None      ! +------------------------+----------+---------------+----------+ !GSV Thigh               !Yes       !Yes            !None      ! +------------------------+----------+---------------+----------+ !Common Femoral          !Yes       !Yes            !None      ! +------------------------+----------+---------------+----------+ !Prox Femoral            !Yes       !Yes            !None      ! +------------------------+----------+---------------+----------+ !Mid Femoral             !Yes       !Yes            !None      ! +------------------------+----------+---------------+----------+ !Dist Femoral            !Yes       !Yes            !None      ! +------------------------+----------+---------------+----------+ !Deep  Femoral            !Yes       !Yes            !None      ! +------------------------+----------+---------------+----------+ !Popliteal               !Yes       !Partial        !Acute     ! +------------------------+----------+---------------+----------+ !GSV Below Knee          !Yes       !Yes            !None      ! +------------------------+----------+---------------+----------+ !Gastroc                 !Yes       !Partial        !Acute     ! +------------------------+----------+---------------+----------+ !PTV                     !Yes       !No             !Acute     ! +------------------------+----------+---------------+----------+ !Peroneal                !Yes       !Yes            !None      ! +------------------------+----------+---------------+----------+ !SSV                     !Yes       !Yes            !None      !  +------------------------+----------+---------------+----------+  Right Doppler Measurements +--------------+------+------+------------+ !Location      !Signal!Reflux!Reflux (sec)! +--------------+------+------+------------+ !Common Femoral!Phasic!      !            ! +--------------+------+------+------------+ !Femoral       !Phasic!      !            ! +--------------+------+------+------------+ !Deep Femoral  !Phasic!      !            ! +--------------+------+------+------------+ !Popliteal     !Phasic!      !            ! +--------------+------+------+------------+  Left Lower Extremities DVT Study Measurements Left 2D Measurements +------------------------+----------+---------------+----------+ !Location                !Visualized!Compressibility!Thrombosis! +------------------------+----------+---------------+----------+ !Sapheno Femoral Junction!Yes       !Yes            !None      ! +------------------------+----------+---------------+----------+ !Common Femoral          !Yes       !Yes            !None      ! +------------------------+----------+---------------+----------+  Left Doppler Measurements +--------------+------+------+------------+ !Location      !Signal!Reflux!Reflux (sec)! +--------------+------+------+------------+ !Common Femoral!Phasic!      !            ! +--------------+------+------+------------+    Cta Pulmonary With Contrast    06/27/2014   EXAMINATION: CTA OF THE CHEST 06/27/2014 2:53 pm  TECHNIQUE: CTA of the chest was performed after the administration of intravenous contrast.  Multiplanar reformatted images are provided for review.  MIP images are provided for review.  Patient received 100 mL Isovue 370 intravenous contrast.  COMPARISON: CT chest with contrast  June 13, 2014.  HISTORY: ORDERING SYSTEM PROVIDED HISTORY: DVT (deep venous thrombosis), unspecified laterality [facility]) TECHNOLOGIST PROVIDED HISTORY: Ordering Physician Provided Reason for Exam: DVT, SOB Injury/Trauma or  Illness: Illness/Other Acuity: Acute Type of Exam: Initial Relevant Medica/Surgical History: CHF, cardiomyopathy  FINDINGS: Pulmonary Arteries: Pulmonary arteries are adequately opacified for evaluation.  No evidence of intraluminal filling defect to suggest pulmonary embolism.  Main pulmonary artery is normal in caliber.  Mediastinum: No evidence of mediastinal lymphadenopathy.  Heart is enlarged, similar in appearance.  The heart and pericardium demonstrate no acute abnormality.  There is no acute abnormality of the thoracic aorta. Nodularity of thyroid similar in appearance.  Lungs/pleura: The central airways are patent.  There is a moderate right-sided pleural effusion which appears slightly decreased compared to prior study.  There is mild patchy dependent opacities in the right upper lobe, similar in appearance.  There are dependent subpleural opacities within the lingula and left lower lobe.  There is mild opacity within the right middle lobe which is linear in appearance in appears slightly improved compared to prior study.  2 nodular appearing densities are again seen in the right lower lobe on image number 65, unchanged.  No pneumothorax.  Upper Abdomen: Cirrhotic liver with mildly distended gallbladder is similar in appearance to prior study.  Soft Tissues/Bones: Multiple compression fractures again seen, most pronounced at the thoracolumbar junction, similar in appearance.  Old right-sided rib fractures are also seen.  No acute bone or soft tissue abnormality.  Gynecomastia is again seen.     06/27/2014   IMPRESSION: No evidence of pulmonary embolism.  Moderate right pleural effusion, minimally decreased.  Persistent patchy areas of airspace opacity which may be related to atelectasis versus pneumonia, slightly improved.  2 nodular appearing densities are seen within the right lower lobe which may be related to the airspace disease or represent lung nodules.  Follow-up recommended with dedicated chest CT  in 3 months.  Remote compression fractures again seen.  Cirrhotic liver.     Vl Extremity Venous Bilateral    07/09/2014   Vascular Lower Extremities DVT Study Procedure  -- PRELIMINARY SONOGRAPHER REPORT --    Demographics    Patient Name      KANI CHAUVIN    Date of Study     07/09/2014          Gender              Male    Patient Number    [id number]          Date of Birth       12-22-1940    Visit Number      [id number]         Age                 30 year(s)    Accession Number  [id number]           Room Number         2952    Corporate ID      [id number]            Sonographer         Erasmo Leventhal,  RVS    Ordering          Deniece Portela,   Interpreting        [facility]  Physician         MD                  Physician   Procedure  Type of Study:    Veins:Lower Extremities DVT Study, VL EXTREMITY VENOUS DUPLEX RIGHT.   Tech Comments Right Acute totally occluding deep vein thrombosis involving the right proximal PTV and one gastroc vein zone 5-6. Acute partially occluding deep vein thrombosis involving the popliteal vein and right one gastroc vein zone 5-6. No other evidence of deep vein or superficial vein thrombosis involving the right lower extremity and the left common femoral vein. Calf veins were not well visualized on the right due to patient positioning and edema.  verbal to [employer].    Vl Extremity Venous Bilateral    06/27/2014   Lower Extremities DVT Study   Demographics    Patient Name       DOLORES MCGOVERN    Date of Study      06/27/2014        Gender              Male    Patient Number     [id number]        Date of Birth       26-Apr-1940    Visit Number       [id number]       Age                 66 year(s)    Accession Number   [id number]         Room Number    Corporate ID       [id number]          Sonographer         Erasmo Leventhal,                                                           RVS    Ordering Physician Lacretia Leigh, MD   Interpreting        [facility]                                       Physician           Darlyn Chamber MD,                                                           [facility]   Procedure  Type of Study:    Veins:Lower Extremities DVT Study, VASC EXTREMITY VENOUS DUPLEX BILATERAL.    Vascular Sonographer Report   Additional Indications:Swelliing  Impressions Right Impression Acute partially occluding deep vein thrombosis involving the right set of gastroc veins zone 5-6. No other evidence of deep vein or superficial vein thrombosis involving the right lower extremity. Reflux noted in the right FV. Left Impression No  evidence of deep vein or superficial vein thrombosis involving the left lower extremity.  Verbal to Dr. Jeb Levering. Patient released.  Conclusions    Summary    -Acute partially occluding deep vein thrombosis involving the right set of  gastroc veins zone 5-6.  -Reflux noted in the right FV.    Signature    ------------------------------------------------------------------  Electronically signed by Darlyn Chamber MD, [facility] (Interpreting  physician) on 06/27/2014 at 01:38 PM  ------------------------------------------------------------------   Patient Status:Routine. [place] - Vascular Lab. Technical Quality:Adequate visualization.  Velocities are measured in cm/s ; Diameters are measured in mm  Right Lower Extremities DVT Study Measurements Right 2D Measurements +------------------------+----------+---------------+----------+ !Location                !Visualized!Compressibility!Thrombosis! +------------------------+----------+---------------+----------+ !Sapheno Femoral Junction!Yes       !Yes            !None      ! +------------------------+----------+---------------+----------+ !GSV Thigh               !Yes       !Yes            !None      ! +------------------------+----------+---------------+----------+ !Common Femoral          !Yes       !Yes            !None      !  +------------------------+----------+---------------+----------+ !Prox Femoral            !Yes       !Yes            !None      ! +------------------------+----------+---------------+----------+ !Mid Femoral             !Yes       !Yes            !None      ! +------------------------+----------+---------------+----------+ !Dist Femoral            !Yes       !Yes            !None      ! +------------------------+----------+---------------+----------+ !Deep Femoral            !Yes       !Yes            !None      ! +------------------------+----------+---------------+----------+ !Popliteal               !Yes       !Yes            !None      ! +------------------------+----------+---------------+----------+ !GSV Below Knee          !Yes       !Yes            !None      ! +------------------------+----------+---------------+----------+ !Gastroc                 !Yes       !Partial        !Acute     ! +------------------------+----------+---------------+----------+ !PTV                     !Yes       !Yes            !None      ! +------------------------+----------+---------------+----------+ !Peroneal                !Yes       !Yes            !  None      ! +------------------------+----------+---------------+----------+ !SSV                     !Yes       !Yes            !None      ! +------------------------+----------+---------------+----------+  Right Doppler Measurements +------------------------+------+------+------------+ !Location                !Signal!Reflux!Reflux (sec)! +------------------------+------+------+------------+ !Sapheno Femoral Junction!Phasic!No    !            ! +------------------------+------+------+------------+ !Common Femoral          !Phasic!No    !            ! +------------------------+------+------+------------+ !Femoral                 !Phasic!Yes   !            ! +------------------------+------+------+------------+ !Deep Femoral            !Phasic!No    !            !  +------------------------+------+------+------------+ !Popliteal               !Phasic!No    !            ! +------------------------+------+------+------------+  Left Lower Extremities DVT Study Measurements Left 2D Measurements +------------------------+----------+---------------+----------+ !Location                !Visualized!Compressibility!Thrombosis! +------------------------+----------+---------------+----------+ !Sapheno Femoral Junction!Yes       !Yes            !None      ! +------------------------+----------+---------------+----------+ !GSV Thigh               !Yes       !Yes            !None      ! +------------------------+----------+---------------+----------+ !Common Femoral          !Yes       !Yes            !None      ! +------------------------+----------+---------------+----------+ !Prox Femoral            !Yes       !Yes            !None      ! +------------------------+----------+---------------+----------+ !Mid Femoral             !Yes       !Yes            !None      ! +------------------------+----------+---------------+----------+ !Dist Femoral            !Yes       !Yes            !None      ! +------------------------+----------+---------------+----------+ !Deep Femoral            !Yes       !Yes            !None      ! +------------------------+----------+---------------+----------+ !Popliteal               !Yes       !Yes            !None      ! +------------------------+----------+---------------+----------+ !GSV Below Knee          !Yes       !Yes            !  None      ! +------------------------+----------+---------------+----------+ !Gastroc                 !Yes       !Yes            !None      ! +------------------------+----------+---------------+----------+ !PTV                     !Yes       !Yes            !None      ! +------------------------+----------+---------------+----------+ !Peroneal                !Yes       !Yes            !None      !  +------------------------+----------+---------------+----------+ !SSV                     !Yes       !Yes            !None      ! +------------------------+----------+---------------+----------+  Left Doppler Measurements +------------------------+------+------+------------+ !Location                !Signal!Reflux!Reflux (sec)! +------------------------+------+------+------------+ !Sapheno Femoral Junction!Phasic!No    !            ! +------------------------+------+------+------------+ !Common Femoral          !Phasic!No    !            ! +------------------------+------+------+------------+ !Femoral                 !Phasic!No    !            ! +------------------------+------+------+------------+ !Deep Femoral            !Phasic!No    !            ! +------------------------+------+------+------------+ !Popliteal               !Phasic!No    !            ! 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 If this is not approved, would pursue SNF.          Deniece Portela  07/12/2014

## 2014-07-12 NOTE — Care Coordination-Inpatient (Signed)
Discharge Planning:  SW received letter of [place] for ARU from [facility]. Letter provided to patient's wife.Patient provided ecf/snf choices. 1) [facility], 2) [facility], and 3) [place]. Referral sent to [facility]. Awaiting response. Will continue to be followed.     Amahia Madonia A. Gilford Rile, MSW,LSW

## 2014-07-12 NOTE — Progress Notes (Signed)
Clinical Pharmacist Note:    Patient is to eventually be transitioned to warfarin after completion of 2 weeks of therapeutic lovenox.     Visited with patient and wife to discuss warfarin. Information provided in counseling session includes (but not limited to): dosing, administration, drug interactions, monitoring INR, signs and symptoms of bleeding, taking missed doses, and when to contact MD.     Warfarin education completed.  Patient and wife verbalized understanding.    Warfarin book was given to the patient.    Thank you for allowing pharmacy to participate in the care of this patient.    Abdo Denault, Pharm.D.  [id number]

## 2014-07-13 LAB — BASIC METABOLIC PANEL
Anion Gap: 8 (ref 3–16)
BUN: 12 mg/dL (ref 7–20)
CO2: 27 mmol/L (ref 21–32)
Calcium: 7.8 mg/dL — ABNORMAL LOW (ref 8.3–10.6)
Chloride: 104 mmol/L (ref 99–110)
Creatinine: 0.7 mg/dL — ABNORMAL LOW (ref 0.8–1.3)
GFR African American: >60 (ref >60)
GFR Non-African American: >60 (ref >60)
Glucose: 99 mg/dL (ref 70–99)
Potassium: 3.2 mmol/L — ABNORMAL LOW (ref 3.5–5.1)
Sodium: 139 mmol/L (ref 136–145)

## 2014-07-13 LAB — CBC WITH AUTO DIFFERENTIAL
Bands Relative: 1 % (ref 0–7)
Basophils %: 0 %
Basophils Absolute: 0 10*3/uL (ref 0.0–0.2)
Eosinophils %: 0 %
Eosinophils Absolute: 0 10*3/uL (ref 0.0–0.6)
Hematocrit: 25.3 % — ABNORMAL LOW (ref 40.5–52.5)
Hemoglobin: 8.5 g/dL — ABNORMAL LOW (ref 13.5–17.5)
Lymphocytes %: 60 %
Lymphocytes Absolute: 1.4 10*3/uL (ref 1.0–5.1)
MCH: 36.9 pg — ABNORMAL HIGH (ref 26.0–34.0)
MCHC: 33.6 g/dL (ref 31.0–36.0)
MCV: 109.8 fL — ABNORMAL HIGH (ref 80.0–100.0)
MPV: 8.8 fL (ref 5.0–10.5)
Monocytes %: 10 %
Monocytes Absolute: 0.2 10*3/uL (ref 0.0–1.3)
Neutrophils %: 29 %
Neutrophils Absolute: 0.7 10*3/uL — CL (ref 1.7–7.7)
PLATELET SLIDE REVIEW: DECREASED
Platelets: 44 10*3/uL — ABNORMAL LOW (ref 135–450)
RBC: 2.3 M/uL — ABNORMAL LOW (ref 4.20–5.90)
RDW: 26 % — ABNORMAL HIGH (ref 12.4–15.4)
WBC: 2.4 10*3/uL — ABNORMAL LOW (ref 4.0–11.0)

## 2014-07-13 MED ORDER — POTASSIUM CHLORIDE ER 10 MEQ PO TBCR
10 MEQ | Freq: Three times a day (TID) | ORAL | Status: DC
Start: 2014-07-13 — End: 2014-07-13

## 2014-07-13 MED FILL — FUROSEMIDE 40 MG PO TABS: 40 MG | ORAL | Qty: 2

## 2014-07-13 MED FILL — LACTULOSE 10 GM/15ML PO SOLN: 10 GM/15ML | ORAL | Qty: 30

## 2014-07-13 MED FILL — CARVEDILOL 3.125 MG PO TABS: 3.125 MG | ORAL | Qty: 1

## 2014-07-13 MED FILL — EPLERENONE 25 MG PO TABS: 25 MG | ORAL | Qty: 2

## 2014-07-13 MED FILL — XIFAXAN 550 MG PO TABS: 550 MG | ORAL | Qty: 1

## 2014-07-13 MED FILL — LOVENOX 80 MG/0.8ML SC SOLN: 80 MG/0.8ML | SUBCUTANEOUS | Qty: 0.8

## 2014-07-13 MED FILL — MAGNESIUM OXIDE 400 (241.3 MG) MG PO TABS: 400 (241.3 Mg) MG | ORAL | Qty: 1

## 2014-07-13 MED FILL — KLOR-CON M20 20 MEQ PO TBCR: 20 MEQ | ORAL | Qty: 2

## 2014-07-13 MED FILL — LISINOPRIL 5 MG PO TABS: 5 MG | ORAL | Qty: 1

## 2014-07-13 MED FILL — NORMAL SALINE FLUSH 0.9 % IV SOLN: 0.9 % | INTRAVENOUS | Qty: 10

## 2014-07-13 MED FILL — CITALOPRAM HYDROBROMIDE 20 MG PO TABS: 20 MG | ORAL | Qty: 1

## 2014-07-13 MED FILL — NORMAL SALINE FLUSH 0.9 % IV SOLN: 0.9 % | INTRAVENOUS | Qty: 20

## 2014-07-13 NOTE — Progress Notes (Signed)
Department of Internal Medicine  General Internal Medicine   Progress Note      SUBJECTIVE: had 5-6 diarrheas this am , mental status clearing appetite poor     History obtained from chart review and the patient  General ROS: positive for  - fatigue and malaise  negative for - chills, fever or night sweats  Psychological ROS: positive for - anxiety and irritability  negative for - disorientation, hallucinations or hostility  Respiratory ROS: no cough, shortness of breath, or wheezing  Cardiovascular ROS: no chest pain or dyspnea on exertion  Gastrointestinal ROS: positive for - appetite loss and diarrhea  negative for - abdominal pain, blood in stools, hematemesis or melena  Genito-Urinary ROS: no dysuria, trouble voiding, or hematuria  Musculoskeletal ROS: chronic musculoskeletal pain     OBJECTIVE      Medications      Current facility-administered medications: enoxaparin (LOVENOX) injection 80 mg, 1 mg/kg, Subcutaneous, BID  furosemide (LASIX) injection 20 mg, 20 mg, Intravenous, See Admin Instructions  diphenhydrAMINE (BENADRYL) tablet 25 mg, 25 mg, Oral, See Admin Instructions  rifaximin (XIFAXAN) tablet 550 mg, 550 mg, Oral, BID  magnesium oxide (MAG-OX) tablet 400 mg, 400 mg, Oral, BID  citalopram (CELEXA) tablet 10 mg, 10 mg, Oral, Daily  furosemide (LASIX) tablet 80 mg, 80 mg, Oral, BID  potassium chloride SA (K-DUR;KLOR-CON M) tablet 40 mEq, 40 mEq, Oral, PRN **OR** potassium chloride 20 MEQ/15ML (10%) oral solution 40 mEq, 40 mEq, Oral, PRN **OR** potassium chloride 10 mEq/100 mL IVPB (Peripheral Line), 10 mEq, Intravenous, PRN  sodium chloride flush 0.9 % injection 10 mL, 10 mL, Intravenous, 2 times per day  sodium chloride flush 0.9 % injection 10 mL, 10 mL, Intravenous, PRN  acetaminophen (TYLENOL) tablet 650 mg, 650 mg, Oral, Q4H PRN  HYDROcodone-acetaminophen (NORCO) 5-325 MG per tablet 1 tablet, 1 tablet, Oral, Q4H PRN **OR** HYDROcodone-acetaminophen (NORCO) 5-325 MG per tablet 2 tablet, 2 tablet,  Oral, Q4H PRN  morphine (PF) injection 2 mg, 2 mg, Intravenous, Q2H PRN **OR** morphine (PF) injection 4 mg, 4 mg, Intravenous, Q2H PRN  magnesium hydroxide (MILK OF MAGNESIA) 400 MG/5ML suspension 30 mL, 30 mL, Oral, Daily PRN  ondansetron (ZOFRAN) injection 4 mg, 4 mg, Intravenous, Q6H PRN  famotidine (PEPCID) tablet 20 mg, 20 mg, Oral, BID PRN  eplerenone (INSPRA) tablet 50 mg, 50 mg, Oral, Daily  lisinopril (PRINIVIL;ZESTRIL) tablet 5 mg, 5 mg, Oral, Nightly  carvedilol (COREG) tablet 3.125 mg, 3.125 mg, Oral, Daily with breakfast **AND** carvedilol (COREG) tablet 6.25 mg, 6.25 mg, Oral, QPM  diclofenac (VOLTAREN) 0.1 % ophthalmic solution 1 drop, 1 drop, Both Eyes, Daily  lactulose (CHRONULAC) 10 GM/15ML solution 20 g, 20 g, Oral, Q4H    Physical      VITALS:  BP 97/63 mmHg   Pulse 71   Temp(Src) 98.7 ??F (37.1 ??C) (Oral)   Resp 16   Ht 5\' 5"  (1.651 m)   Wt 177 lb 4.8 oz (80.423 kg)   BMI 29.50 kg/m2   SpO2 93%  TEMPERATURE:  Current - Temp: 98.7 ??F (37.1 ??C); Max - Temp  Avg: 98.6 ??F (37 ??C)  Min: 98.5 ??F (36.9 ??C)  Max: 98.8 ??F (37.1 ??C)  RESPIRATIONS RANGE: Resp  Avg: 16  Min: 16  Max: 16  PULSE RANGE: Pulse  Avg: 73.6  Min: 70  Max: 78  BLOOD PRESSURE RANGE:  Systolic (30QMV), HQI:696 mmHg, Min:97 mmHg, EXB:284 mmHg  ; Diastolic (13KGM), WNU:27 mmHg, Min:63 mmHg, Max:69 mmHg  PULSE OXIMETRY RANGE: SpO2  Avg: 93.3 %  Min: 92 %  Max: 95 %  24HR INTAKE/OUTPUT:    Intake/Output Summary (Last 24 hours) at 07/13/14 1930  Last data filed at 07/13/14 1010   Gross per 24 hour   Intake    240 ml   Output      0 ml   Net    240 ml     CONSTITUTIONAL:  fatigued, alert, cooperative, mild distress and appears older than stated age  NECK:  supple, symmetrical, trachea midline, skin normal and no stridor  LUNGS:  Vesicular , no wheezes   CARDIOVASCULAR:  normal apical pulses, regular rate and rhythm, normal S1 and S2 and no S3  ABDOMEN:  Soft BS + non tender  No hepatomegaly  Mild ascites   MUSCULOSKELETAL:  Trace edema    NEUROLOGIC:  No asterixis , no motor weakness   SKIN:  Warm and dry  and no bruising or bleeding    Data      No results found for: PHART, PO2ART, PCO2ART, HCO3ART, BEART, O2SATART    Lab Results   Component Value Date    NA 139 07/13/2014    K 3.2 07/13/2014    CL 104 07/13/2014    CO2 27 07/13/2014    BUN 12 07/13/2014    CREATININE 0.7 07/13/2014    GLUCOSE 99 07/13/2014    CALCIUM 7.8 07/13/2014     Lab Results   Component Value Date    WBC 2.4 07/13/2014    HGB 8.5 07/13/2014    HCT 25.3 07/13/2014    MCV 109.8 07/13/2014    PLT 44 07/13/2014         Lab Results   Component Value Date    INR 1.28* 07/07/2014    PROTIME 14.7* 07/07/2014       ASSESSMENT AND PLAN      Patient Active Problem List   Diagnosis   ??? Erectile dysfunction   ??? Thrombocytopenia   ??? Edema   ??? BPH (benign prostatic hyperplasia)   ??? Alpha-1-antitrypsin deficiency   ??? Cirrhosis, nonalcoholic ([place])   ??? Mild CAD   ??? Chronic systolic CHF (congestive heart failure) ([place])   ??? Non-ischemic cardiomyopathy ([place])   ??? Lumbar compression fracture ([place])   ??? Chronic low back pain   ??? Osteopenia   ??? Coagulopathy ([place])   ??? Abnormal EKG   ??? Visual changes   ??? Fixed pupil of right eye   ??? Anemia   ??? Hemispheric carotid artery syndrome   ??? CAD in native artery   ??? Hyperlipidemia   ??? 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 Bronchiectasis without complication ([place])   ??? Benign essential HTN   ??? Hepatic encephalopathy (HCC)   ??? Ataxia   ??? Pancytopenia ([place])   ??? DVT (deep venous thrombosis) (HCC)     Supplement K    monitor Pancytopenia    Hematology consult noted   K sliding scale    Rifaximin and  Chronulac

## 2014-07-13 NOTE — Progress Notes (Signed)
Bedside report received from [place], [place].  Pt in bed, eyes closed, alarm on, call light within reach.

## 2014-07-13 NOTE — Progress Notes (Signed)
Pt reports having BM.  Gave scheduled lactulose and lovenox.  Pt finished with breakfast, ate 100%.  Pt denies any further needs at this time

## 2014-07-13 NOTE — Progress Notes (Signed)
Call from lab received regarding panic absolute neutrophil count of 0.7, paged on call MD for [facility].

## 2014-07-13 NOTE — Progress Notes (Signed)
Pt up to BR, voided and BM.  Pt ambulated around unit one time with standby assist, tolerated well.  Pt back to bed, denies any further needs at this time.

## 2014-07-13 NOTE — Progress Notes (Signed)
Lactulose held, pt and wife denied needs. Will monitor.

## 2014-07-13 NOTE — Progress Notes (Signed)
VSS, pt denies pain.  Shift assessment complete, scheduled meds given, held coreg and inspra per hold orders for SBP less than 110.  Provided fresh ice water, pt plans on ordering breakfast soon.

## 2014-07-13 NOTE — Progress Notes (Signed)
Pt in bed, eyes closed, alarm on, call light within reach.

## 2014-07-13 NOTE — Progress Notes (Signed)
Gave scheduled lasix.  Held coreg, BP less than 110.  Held lactulose, pt has had 3 BM's today.

## 2014-07-13 NOTE — Telephone Encounter (Signed)
Guerry Minors from [facility] called about =absolute neutrophil= 0.7, and to discuss giving the patient Lovenox still. ePaged provider.

## 2014-07-13 NOTE — Progress Notes (Signed)
Dr. Marylou Mccoy notified about panic absolute neutrophil count, received order for neutropenic precautions and low microbial diet.  Okay to give lovenox with platelet count 44 per Dr. Marylou Mccoy

## 2014-07-13 NOTE — Progress Notes (Signed)
Sitting in bed. Assessment complete. See doc flowsheet. Lung sounds are clear, bowel sounds are active in all 4 quadrants. Reports 5 bm's  Today, refuses lactulose. Wife at bedside. Denies needs.  Call light in reach. Belongings in reach.  Electronically signed by Daryel November, RN on 07/13/2014 at 11:21 PM

## 2014-07-13 NOTE — Progress Notes (Signed)
[facility] FOLLOW-UP:     Primary Oncologist: Dr. Eulis Canner  Date: 07/13/2014    PCP: Alecia Lemming, DO  Referring Provider: No ref. provider found    PROBLEM LIST:     Patient Active Problem List   Diagnosis   ??? Erectile dysfunction   ??? Thrombocytopenia   ??? Edema   ??? BPH (benign prostatic hyperplasia)   ??? Alpha-1-antitrypsin deficiency   ??? Cirrhosis, nonalcoholic ([place])   ??? Mild CAD   ??? Chronic systolic CHF (congestive heart failure) ([place])   ??? Non-ischemic cardiomyopathy ([place])   ??? Lumbar compression fracture ([place])   ??? Chronic low back pain   ??? Osteopenia   ??? Coagulopathy ([place])   ??? Abnormal EKG   ??? Visual changes   ??? Fixed pupil of right eye   ??? Anemia   ??? Hemispheric carotid artery syndrome   ??? CAD in native artery   ??? Hyperlipidemia   ??? 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 Bronchiectasis without complication ([place])   ??? Benign essential HTN   ??? Hepatic encephalopathy ([facility])   ??? Ataxia   ??? Pancytopenia ([place])   ??? DVT (deep venous thrombosis) ([facility])       Specialty Problems     None          INTERVAL HISTORY     Mild improvement in fatigue, no bleeding issues or fevers    REVIEW OF SYSTEMS:     Review of Systems - General ROS: positive for  - fatigue  negative for - chills or fever  Respiratory ROS: no cough, shortness of breath, or wheezing  Cardiovascular ROS: no chest pain or dyspnea on exertion  Gastrointestinal ROS: no abdominal pain, change in bowel habits, or black or bloody stools  Genito-Urinary ROS: no dysuria, trouble voiding, or hematuria    PHYSICAL EXAM:     BP 107/68 mmHg   Pulse 78   Temp(Src) 98.5 ??F (36.9 ??C) (Oral)   Resp 16   Ht 5\' 5"  (1.651 m)   Wt 177 lb 4.8 oz (80.423 kg)   BMI 29.50 kg/m2   SpO2 92%    WEIGHT:   Wt Readings from Last 3 Encounters:   07/12/14 177 lb 4.8 oz (80.423 kg)   07/03/14 180 lb (81.647 kg)   06/26/14 185 lb (83.915 kg)     GENERAL APPEARANCE: alert and cooperative  HEAD: Normocephalic, without obvious abnormality, atraumatic  NECK: No palpable lymphadenopathy in supraclavicular  or cervical chains  LUNGS: Clear to auscultation bilaterally, no audible rales, wheezes or crackles  HEART: Regular rate and rhythm  ABDOMEN: Soft, non-tender; bowel sounds normal; no masses, no organomegaly  EXTREMITIES: without cyanosis, clubbing, or asymmetry. Leg edema.  SKIN: No jaundice, purpura or petechiae    LABS:     CBC:  Lab Results   Component Value Date    WBC 2.4* 07/13/2014    HGB 8.5* 07/13/2014    HCT 25.3* 07/13/2014    MCV 109.8* 07/13/2014    PLT 44* 07/13/2014    LYMPHOPCT 60.0 07/13/2014    RBC 2.30* 07/13/2014    MCH 36.9* 07/13/2014    MCHC 33.6 07/13/2014    RDW 26.0* 07/13/2014       Lab Results   Component Value Date    NA 139 07/13/2014    K 3.2* 07/13/2014    CL 104 07/13/2014    CO2 27 07/13/2014    BUN 12 07/13/2014    CREATININE 0.7* 07/13/2014  GLUCOSE 99 07/13/2014    CALCIUM 7.8* 07/13/2014    PROT 5.5* 07/09/2014    LABALBU 1.9* 07/09/2014    BILITOT 1.1* 07/09/2014    ALKPHOS 129 07/09/2014    AST 35 07/09/2014    ALT 20 07/09/2014    LABGLOM >60 07/13/2014    GFRAA >60 07/13/2014    AGRATIO 0.6* 05/03/2014    GLOB 3.9 05/03/2014       No results found for: PTINR    TUMOR MARKERS:    Lab Results   Component Value Date    PSA 0.55 08/15/2013         IMAGING:     Ct Head Wo Contrast    07/07/2014   EXAMINATION: CT OF THE HEAD WITHOUT CONTRAST  07/07/2014 11:11 pm  TECHNIQUE: CT of the head was performed without the administration of intravenous contrast.  COMPARISON: 04/01/2014  HISTORY: ORDERING SYSTEM PROVIDED HISTORY: dizzy TECHNOLOGIST PROVIDED HISTORY: Ordering Physician Provided Reason for Exam: dizzy  FINDINGS: BRAIN/VENTRICLES: There is no acute intracranial hemorrhage, mass effect or midline shift.  No abnormal extra-axial fluid collection.  The gray-white differentiation is maintained without evidence of an acute infarct.  There is no evidence of hydrocephalus.  ORBITS: The visualized portion of the orbits demonstrate no acute abnormality.  SINUSES: There is minimal  mucosal thickening in the left maxillary sinus. The remaining visualized paranasal sinuses are clear.  The mastoid air cells are unremarkable.  SOFT TISSUES/SKULL:  No acute abnormality of the visualized skull or soft tissues.     07/07/2014   IMPRESSION: No acute intracranial abnormality.     Ct Chest W Contrast    06/13/2014   EXAMINATION: CT OF THE CHEST WITH CONTRAST 06/13/2014 3:56 pm  TECHNIQUE: CT of the chest was performed with the administration of intravenous contrast. Multiplanar reformatted images are provided for review.  COMPARISON: None.  HISTORY: Bacterial pneumonia, unspecified  FINDINGS: Mediastinum:  *A lymph node is seen pretracheal upper mediastinum interposed between the proximal left common carotid artery and brachiocephalic artery, image 31. This measures approximately 12 mm.  Subcarinal density possible subcarinal adenopathy, difficult to separate from adjacent fluid. *While there is no significant cardiac enlargement, there is chamber enlargement, left ventricle and left atrium.  This could indicate mitral valve disease.  Coronary calcification is present. *There is mild coronary calcification. *Vasculature is unremarkable for age. Lungs/pleura:  *Large pleural effusion is seen on the right and small effusion on the left. Multifocal patchy areas of airspace disease are seen bilaterally both upper and lower lobes.  Right lower lobe has a somewhat bronchus centric distribution.  There is bronchiectasis bilaterally.  Focal area of consolidation is seen, right upper lobe anteriorly. *Airways are unremarkable. Soft Tissues/Bones: Bilateral gynecomastia is present.  Small nodular lesion projects from the inferior right thyroid gland.  Sagittal projection demonstrates multiple compression fractures L1 moderate to severe, L2 moderate, T9 moderate and T10 minimal.  Bones are demineralized.  These fractures are faintly noted on prior lateral chest radiograph.  Upper Abdomen: The liver is grossly abnormal,  with small overall size, increased density and extensive nodularity throughout the serosal surface. The spleen is mildly enlarged.  There is are extensive unenhanced splenic varices.  Multiple unenhanced gastric and paraesophageal varices are also noted.  Small right renal calculus present     06/13/2014   IMPRESSION: Multifocal airspace disease most likely pneumonia with large pleural effusion primarily on the right.  Chronic bronchiectasis.  Left ventricular and atrial chamber enlargement could indicate  mitral valve disease.  Advanced cirrhotic morphology of the liver with portal hypertension and extensive varices, unenhanced on current CT.  Multiple compression fractures essentially unchanged.  Gynecomastia.  Multiple prior moderate compression fractures.     US Abdomen Complete    07/08/2014   EXAMINATION: COMPLETE ABDOMINAL ULTRASOUND 07/08/2014 6:04 pm  COMPARISON: None  HISTORY: ORDERING SYSTEM PROVIDED HISTORY: CIRRHOSIS TECHNOLOGIST PROVIDED HISTORY: Ordering Physician Provided Reason for Exam: cirrhosis, r/o ascites Acuity: Acute Type of Exam: Initial  FINDINGS: Study is limited by body habitus.  LIVER: Evaluation of the liver is limited.  There is diffuse increased echogenicity with no focal lesion noted.  Somewhat nodular contour of the liver is compatible with known cirrhosis.  BILIARY SYSTEM: Limited imaging of the gallbladder is grossly unremarkable.  Common bile duct is within normal limits measuring 6 mm.  KIDNEYS: Right kidney is nonvisualized.  Left kidney measures 10.1 x 4.9 x 5.3 cm and is grossly unremarkable.  PANCREAS: Pancreas is nonvisualized.  SPLEEN: Spleen measures 10.1 cm.  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  IVC: The IVC is patent.  AORTA: Aorta is obscured.  OTHER: No evidence of ascites.     07/08/2014   IMPRESSION: Limited study as above.  Cirrhosis and portal hypertension.  No evidence of ascites.     Xr Chest Portable    07/08/2014   EXAMINATION: SINGLE VIEW OF THE CHEST  07/07/2014 11:34 pm   COMPARISON: 06/03/2014  HISTORY: ORDERING SYSTEM PROVIDED HISTORY: other TECHNOLOGIST PROVIDED HISTORY: Ordering Physician Provided Reason for Exam: dizziness Acuity: Unknown Type of Exam: Unknown  FINDINGS: Cardiac leads project over the chest.  Diffuse interstitial opacity is demonstrated bilaterally.  Heterogeneous opacity is seen adjacent to the left heart border.  No evidence of pleural effusion.  Negative for pneumothorax. Cardiac and mediastinal silhouettes are similar to prior.  Old posterior left 7th rib fracture.     07/08/2014   IMPRESSION: Mild pulmonary edema.     Vl Extremity Venous Right    07/09/2014   Vascular Lower Extremities DVT Study Procedure  -- PRELIMINARY SONOGRAPHER REPORT --    Demographics    Patient Name      DUSHAUN OKEY    Date of Study     07/09/2014          Gender              Male    Patient Number    [id number]          Date of Birth       30-May-1940    Visit Number      [id number]         Age                 74 year(s)    Accession Number  [id number]           Room Number         5784    Corporate ID      [id number]            [place], [place],                                                            [place]    Ordering  Deniece Portela,   Interpreting        [facility]  Physician         MD                  Physician   Procedure  Type of Study:    Veins:Lower Extremities DVT Study, VL EXTREMITY VENOUS DUPLEX RIGHT.    Generic Orders:VL EXTREMITY VENOUS DUPLEX RIGHT.   Tech Comments Right Acute totally occluding deep vein thrombosis involving the right proximal PTV and one gastroc vein zone 5-6. Acute partially occluding deep vein thrombosis involving the popliteal vein and right one gastroc vein zone 5-6. No other evidence of deep vein or superficial vein thrombosis involving the right lower extremity and the left common femoral vein. Calf veins were not well visualized on the right due to patient positioning and edema.  Verbal to [employer].    Cta  Pulmonary With Contrast    06/27/2014   EXAMINATION: CTA OF THE CHEST 06/27/2014 2:53 pm  TECHNIQUE: CTA of the chest was performed after the administration of intravenous contrast.  Multiplanar reformatted images are provided for review.  MIP images are provided for review.  Patient received 100 mL Isovue 370 intravenous contrast.  COMPARISON: CT chest with contrast June 13, 2014.  HISTORY: ORDERING SYSTEM PROVIDED HISTORY: DVT (deep venous thrombosis), unspecified laterality [facility]) TECHNOLOGIST PROVIDED HISTORY: Ordering Physician Provided Reason for Exam: DVT, SOB Injury/Trauma or Illness: Illness/Other Acuity: Acute Type of Exam: Initial Relevant Medica/Surgical History: CHF, cardiomyopathy  FINDINGS: Pulmonary Arteries: Pulmonary arteries are adequately opacified for evaluation.  No evidence of intraluminal filling defect to suggest pulmonary embolism.  Main pulmonary artery is normal in caliber.  Mediastinum: No evidence of mediastinal lymphadenopathy.  Heart is enlarged, similar in appearance.  The heart and pericardium demonstrate no acute abnormality.  There is no acute abnormality of the thoracic aorta. Nodularity of thyroid similar in appearance.  Lungs/pleura: The central airways are patent.  There is a moderate right-sided pleural effusion which appears slightly decreased compared to prior study.  There is mild patchy dependent opacities in the right upper lobe, similar in appearance.  There are dependent subpleural opacities within the lingula and left lower lobe.  There is mild opacity within the right middle lobe which is linear in appearance in appears slightly improved compared to prior study.  2 nodular appearing densities are again seen in the right lower lobe on image number 65, unchanged.  No pneumothorax.  Upper Abdomen: Cirrhotic liver with mildly distended gallbladder is similar in appearance to prior study.  Soft Tissues/Bones: Multiple compression fractures again seen, most pronounced at the  thoracolumbar junction, similar in appearance.  Old right-sided rib fractures are also seen.  No acute bone or soft tissue abnormality.  Gynecomastia is again seen.     06/27/2014   IMPRESSION: No evidence of pulmonary embolism.  Moderate right pleural effusion, minimally decreased.  Persistent patchy areas of airspace opacity which may be related to atelectasis versus pneumonia, slightly improved.  2 nodular appearing densities are seen within the right lower lobe which may be related to the airspace disease or represent lung nodules.  Follow-up recommended with dedicated chest CT in 3 months.  Remote compression fractures again seen.  Cirrhotic liver.     Vl Extremity Venous Bilateral    07/09/2014   Vascular Lower Extremities DVT Study Procedure  -- PRELIMINARY SONOGRAPHER REPORT --    Demographics    Patient Name      MOLSTAD  Normal    Date of Study     07/09/2014          Gender              Male    Patient Number    [id number]          Date of Birth       March 26, 1940    Visit Number      [id number]         Age                 46 year(s)    Accession Number  [id number]           Room Number         3474    Corporate ID      [id number]            Sonographer         Erasmo Leventhal,                                                            [facility]    Ordering          Deniece Portela,   Interpreting        [facility]  Physician         MD                  Physician   Procedure  Type of Study:    Veins:Lower Extremities DVT Study, VL EXTREMITY VENOUS DUPLEX RIGHT.   Tech Comments Right Acute totally occluding deep vein thrombosis involving the right proximal PTV and one gastroc vein zone 5-6. Acute partially occluding deep vein thrombosis involving the popliteal vein and right one gastroc vein zone 5-6. No other evidence of deep vein or superficial vein thrombosis involving the right lower extremity and the left common femoral vein. Calf veins were not well visualized on the right due to patient positioning and edema.   verbal to [employer].    Vl Extremity Venous Bilateral    06/27/2014   Lower Extremities DVT Study   Demographics    Patient Name       SAN LOHMEYER    Date of Study      06/27/2014        Gender              Male    Patient Number     [id number]        Date of Birth       November 16, 1940    Visit Number       [id number]       Age                 89 year(s)    Accession Number   [id number]         Room Number    Corporate ID       [id number]          Sonographer         Erasmo Leventhal,  [facility]    Ordering Physician Lacretia Leigh, MD  Interpreting        [facility]                                       Physician           Darlyn Chamber MD,                                                           [facility]   Procedure  Type of Study:    Veins:Lower Extremities DVT Study, VASC EXTREMITY VENOUS DUPLEX BILATERAL.    Vascular Sonographer Report   Additional Indications:Swelliing  Impressions Right Impression Acute partially occluding deep vein thrombosis involving the right set of gastroc veins zone 5-6. No other evidence of deep vein or superficial vein thrombosis involving the right lower extremity. Reflux noted in the right FV. Left Impression No evidence of deep vein or superficial vein thrombosis involving the left lower extremity.  Verbal to Dr. Jeb Levering. Patient released.  Conclusions    Summary    -Acute partially occluding deep vein thrombosis involving the right set of  gastroc veins zone 5-6.  -Reflux noted in the right FV.    Signature    ------------------------------------------------------------------  Electronically signed by Darlyn Chamber MD, [facility] (Interpreting  physician) on 06/27/2014 at 01:38 PM  ------------------------------------------------------------------   Patient Status:Routine. [facility] - Vascular Lab. Technical Quality:Adequate visualization.  Velocities are measured in cm/s ; Diameters are measured in mm  Right Lower  Extremities DVT Study Measurements Right 2D Measurements    STAGING:     No matching staging information was found for the patient.    ASSESSMENT AND PLAN:     1. Right LE DVT.  Continue therapeutic Lovenox for total 2 weeks then transition to coumadin with target INR about 2.  2. Pancytopenia.  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    3. Macrocytosis.     Explained to the wife at great length about his condition.  ESA started, plan for MDS per Dr. Al Corpus.  Blood counts will not improve significantly so discharge per symptoms and not based upon blood counts.  OK to discharge from my perspective when OK with primary service.

## 2014-07-13 NOTE — Progress Notes (Signed)
VSS, pt denies pain.  Pt up to BR, voided.  Pt back to bed, alarm on, call light within reach.  Pt denies any further needs at this time.

## 2014-07-13 NOTE — Progress Notes (Signed)
No sign of distress noted. Will monitor.

## 2014-07-13 NOTE — Progress Notes (Signed)
VSS, pt denies pain.  Pt getting ready to order supper.  Pt in bed, denies any further needs at this time

## 2014-07-14 LAB — BASIC METABOLIC PANEL
Anion Gap: 5 (ref 3–16)
BUN: 13 mg/dL (ref 7–20)
CO2: 29 mmol/L (ref 21–32)
Calcium: 8.2 mg/dL — ABNORMAL LOW (ref 8.3–10.6)
Chloride: 105 mmol/L (ref 99–110)
Creatinine: 0.7 mg/dL — ABNORMAL LOW (ref 0.8–1.3)
GFR African American: >60 (ref >60)
GFR Non-African American: >60 (ref >60)
Glucose: 95 mg/dL (ref 70–99)
Potassium: 3.9 mmol/L (ref 3.5–5.1)
Sodium: 139 mmol/L (ref 136–145)

## 2014-07-14 LAB — CBC
Hematocrit: 25 % — ABNORMAL LOW (ref 40.5–52.5)
Hemoglobin: 8.3 g/dL — ABNORMAL LOW (ref 13.5–17.5)
MCH: 37 pg — ABNORMAL HIGH (ref 26.0–34.0)
MCHC: 33.3 g/dL (ref 31.0–36.0)
MCV: 111.3 fL — ABNORMAL HIGH (ref 80.0–100.0)
MPV: 9.4 fL (ref 5.0–10.5)
Platelets: 48 10*3/uL — ABNORMAL LOW (ref 135–450)
RBC: 2.25 M/uL — ABNORMAL LOW (ref 4.20–5.90)
RDW: 25.2 % — ABNORMAL HIGH (ref 12.4–15.4)
WBC: 2.4 10*3/uL — ABNORMAL LOW (ref 4.0–11.0)

## 2014-07-14 MED FILL — LOVENOX 80 MG/0.8ML SC SOLN: 80 MG/0.8ML | SUBCUTANEOUS | Qty: 0.8

## 2014-07-14 MED FILL — NORMAL SALINE FLUSH 0.9 % IV SOLN: 0.9 % | INTRAVENOUS | Qty: 10

## 2014-07-14 MED FILL — CITALOPRAM HYDROBROMIDE 20 MG PO TABS: 20 MG | ORAL | Qty: 1

## 2014-07-14 MED FILL — MAGNESIUM OXIDE 400 (241.3 MG) MG PO TABS: 400 (241.3 Mg) MG | ORAL | Qty: 1

## 2014-07-14 MED FILL — FUROSEMIDE 40 MG PO TABS: 40 MG | ORAL | Qty: 2

## 2014-07-14 MED FILL — LACTULOSE 10 GM/15ML PO SOLN: 10 GM/15ML | ORAL | Qty: 30

## 2014-07-14 MED FILL — XIFAXAN 550 MG PO TABS: 550 MG | ORAL | Qty: 1

## 2014-07-14 MED FILL — LISINOPRIL 5 MG PO TABS: 5 MG | ORAL | Qty: 1

## 2014-07-14 MED FILL — NORMAL SALINE FLUSH 0.9 % IV SOLN: 0.9 % | INTRAVENOUS | Qty: 20

## 2014-07-14 NOTE — Plan of Care (Signed)
Problem: Falls - Risk of  Goal: Absence of falls  Outcome: Ongoing  Pt free from falls this admit, pt and family educated about fall precautions.    Problem: Risk for Impaired Skin Integrity  Goal: Tissue integrity - skin and mucous membranes  Structural intactness and normal physiological function of skin and  mucous membranes.   Outcome: Ongoing  Pt's skin assessed every shift and prn, pt's skin cleansed daily and as needed, pt educated about preventing pressure ulcers, pt able to turn self.        Problem: PROTECTIVE PRECAUTIONS  Goal: Patient will remain free of nosocomial Infections  Outcome: Ongoing  Neutropenic precautions maintained, low microbial diet ordered, pt and family educated about precautions

## 2014-07-14 NOTE — Progress Notes (Signed)
Shift assessment complete.  Vital signs obtained.  B/P decreased.  Patient does not have IV access.  Will continue to monitor B/P.  Patient denies having pain.  Patient given evening medication with water.  Lovenox given.  Will give lactulose at ten.  Patient denies other needs at this time.  Call light in reach and family at bedside.  Wife refusing bed alarm.Electronically signed by Dede Query, RN on 07/14/2014 at 8:46 PM

## 2014-07-14 NOTE — Progress Notes (Signed)
VSS, pt denies pain.  Pt up in chair, family at bedside.  Pt denies any further needs at this time.

## 2014-07-14 NOTE — Progress Notes (Signed)
Pt is high fall risk, pt and his wife refuse bed alarm.  Educated pt and family about fall precautions.  Pt's wife is at bedside 24 hrs a day and assists pt with BR needs.

## 2014-07-14 NOTE — Progress Notes (Signed)
Wife states pt has been up to bathroom to have bm 5 x overnight and it is getting to be almost water. Refused lactulose. Call light in reach. Wife wants bed alarm left off. Educated on need for assist, agrees to call for assist if needed. Electronically signed by Daryel November, RN on 07/14/2014 at 6:33 AM

## 2014-07-14 NOTE — Progress Notes (Signed)
VSS, pt denies pain.  Shift assessment complete.  Pt resting in bed, turns self, wife at bedside.  Pt denies any further needs at this time.

## 2014-07-14 NOTE — Progress Notes (Signed)
Pt up in chair, eyes closed, wife at bedside, call light within reach.

## 2014-07-14 NOTE — Progress Notes (Signed)
Pt and wife did not want 0200 lactulose due to having 5 bm's during day. Resting quietly. Call light in reach. Electronically signed by Daryel November, RN on 07/14/2014 at 4:01 AM

## 2014-07-14 NOTE — Progress Notes (Signed)
Pt's IV expires tonight.  Pt and his wife asked if IV can be left out.  Spoke to Dr. Shearon Stalls, he said IV can be left out.

## 2014-07-14 NOTE — Progress Notes (Signed)
Patient walked unit.  Tolerated well.  B/P obtained after patient had been back to bed for 1/2 hour.  WNL.  Given lactulose.  Denies pain at this time.  Denies other needs.  Call light in reach and fall precautions in place.  Wife refusing bed alarm.Electronically signed by Dede Query, RN on 07/14/2014 at 10:29 PM

## 2014-07-14 NOTE — Progress Notes (Signed)
VSS, pt denies pain.  Pt up to chair, alarm on, call light within reach.  Pt denies any further needs at this time.

## 2014-07-14 NOTE — Plan of Care (Signed)
Problem: Falls - Risk of  Intervention: Fall risk assessment  Bed alarm on, fall armband on, orange blanket on bed, orange light on door.         Goal: Absence of falls  Outcome: Ongoing  Remains free of falls or accidental injury. Gait is steady, call light in reach. Bed in lowest position with wheels locked. Personal belongings in reach.        Problem: Risk for Impaired Skin Integrity  Goal: Tissue integrity - skin and mucous membranes  Structural intactness and normal physiological function of skin and  mucous membranes.   Intervention: TURN PATIENT  Encouraged to turn and change position in bed.

## 2014-07-14 NOTE — Progress Notes (Signed)
[facility] FOLLOW-UP:     Primary Oncologist: Dr. Eulis Canner  Date: 07/14/2014    PCP: Alecia Lemming, DO  Referring Provider: No ref. provider found    PROBLEM LIST:     Patient Active Problem List   Diagnosis   ??? Erectile dysfunction   ??? Thrombocytopenia   ??? Edema   ??? BPH (benign prostatic hyperplasia)   ??? Alpha-1-antitrypsin deficiency   ??? Cirrhosis, nonalcoholic ([place])   ??? Mild CAD   ??? Chronic systolic CHF (congestive heart failure) ([place])   ??? Non-ischemic cardiomyopathy ([place])   ??? Lumbar compression fracture ([place])   ??? Chronic low back pain   ??? Osteopenia   ??? Coagulopathy ([place])   ??? Abnormal EKG   ??? Visual changes   ??? Fixed pupil of right eye   ??? Anemia   ??? Hemispheric carotid artery syndrome   ??? CAD in native artery   ??? Hyperlipidemia   ??? 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 Bronchiectasis without complication ([place])   ??? Benign essential HTN   ??? Hepatic encephalopathy ([facility])   ??? Ataxia   ??? Pancytopenia ([place])   ??? DVT (deep venous thrombosis) (H[facility])       Specialty Problems     None          INTERVAL HISTORY     Mild improvement in fatigue, no bleeding issues or fevers, to ECF tomorrow, most likely    REVIEW OF SYSTEMS:     Review of Systems - General ROS: positive for  - fatigue  negative for - chills or fever  Respiratory ROS: no cough, shortness of breath, or wheezing  Cardiovascular ROS: no chest pain or dyspnea on exertion  Gastrointestinal ROS: no abdominal pain, change in bowel habits, or black or bloody stools  Genito-Urinary ROS: no dysuria, trouble voiding, or hematuria    PHYSICAL EXAM:     BP 122/62 mmHg   Pulse 78   Temp(Src) 98.3 ??F (36.8 ??C) (Oral)   Resp 16   Ht 5\' 5"  (1.651 m)   Wt 177 lb 4.8 oz (80.423 kg)   BMI 29.50 kg/m2   SpO2 94%    WEIGHT:   Wt Readings from Last 3 Encounters:   07/12/14 177 lb 4.8 oz (80.423 kg)   07/03/14 180 lb (81.647 kg)   06/26/14 185 lb (83.915 kg)     GENERAL APPEARANCE: alert and cooperative  HEAD: Normocephalic, without obvious abnormality, atraumatic  NECK: No palpable  lymphadenopathy in supraclavicular or cervical chains  LUNGS: Clear to auscultation bilaterally, no audible rales, wheezes or crackles  HEART: Regular rate and rhythm  ABDOMEN: Soft, non-tender; bowel sounds normal; no masses, no organomegaly  EXTREMITIES: without cyanosis, clubbing, or asymmetry. Leg edema.  SKIN: No jaundice, purpura or petechiae    LABS:     CBC:  Lab Results   Component Value Date    WBC 2.4* 07/14/2014    HGB 8.3* 07/14/2014    HCT 25.0* 07/14/2014    MCV 111.3* 07/14/2014    PLT 48* 07/14/2014    LYMPHOPCT 60.0 07/13/2014    RBC 2.25* 07/14/2014    MCH 37.0* 07/14/2014    MCHC 33.3 07/14/2014    RDW 25.2* 07/14/2014       Lab Results   Component Value Date    NA 139 07/14/2014    K 3.9 07/14/2014    CL 105 07/14/2014    CO2 29 07/14/2014    BUN 13 07/14/2014  CREATININE 0.7* 07/14/2014    GLUCOSE 95 07/14/2014    CALCIUM 8.2* 07/14/2014    PROT 5.5* 07/09/2014    LABALBU 1.9* 07/09/2014    BILITOT 1.1* 07/09/2014    ALKPHOS 129 07/09/2014    AST 35 07/09/2014    ALT 20 07/09/2014    LABGLOM >60 07/14/2014    GFRAA >60 07/14/2014    AGRATIO 0.6* 05/03/2014    GLOB 3.9 05/03/2014       No results found for: PTINR    TUMOR MARKERS:    Lab Results   Component Value Date    PSA 0.55 08/15/2013         IMAGING:     Ct Head Wo Contrast    07/07/2014   EXAMINATION: CT OF THE HEAD WITHOUT CONTRAST  07/07/2014 11:11 pm  TECHNIQUE: CT of the head was performed without the administration of intravenous contrast.  COMPARISON: 04/01/2014  HISTORY: ORDERING SYSTEM PROVIDED HISTORY: dizzy TECHNOLOGIST PROVIDED HISTORY: Ordering Physician Provided Reason for Exam: dizzy  FINDINGS: BRAIN/VENTRICLES: There is no acute intracranial hemorrhage, mass effect or midline shift.  No abnormal extra-axial fluid collection.  The gray-white differentiation is maintained without evidence of an acute infarct.  There is no evidence of hydrocephalus.  ORBITS: The visualized portion of the orbits demonstrate no acute abnormality.   SINUSES: There is minimal mucosal thickening in the left maxillary sinus. The remaining visualized paranasal sinuses are clear.  The mastoid air cells are unremarkable.  SOFT TISSUES/SKULL:  No acute abnormality of the visualized skull or soft tissues.     07/07/2014   IMPRESSION: No acute intracranial abnormality.     Ct Chest W Contrast    06/13/2014   EXAMINATION: CT OF THE CHEST WITH CONTRAST 06/13/2014 3:56 pm  TECHNIQUE: CT of the chest was performed with the administration of intravenous contrast. Multiplanar reformatted images are provided for review.  COMPARISON: None.  HISTORY: Bacterial pneumonia, unspecified  FINDINGS: Mediastinum:  *A lymph node is seen pretracheal upper mediastinum interposed between the proximal left common carotid artery and brachiocephalic artery, image 31. This measures approximately 12 mm.  Subcarinal density possible subcarinal adenopathy, difficult to separate from adjacent fluid. *While there is no significant cardiac enlargement, there is chamber enlargement, left ventricle and left atrium.  This could indicate mitral valve disease.  Coronary calcification is present. *There is mild coronary calcification. *Vasculature is unremarkable for age. Lungs/pleura:  *Large pleural effusion is seen on the right and small effusion on the left. Multifocal patchy areas of airspace disease are seen bilaterally both upper and lower lobes.  Right lower lobe has a somewhat bronchus centric distribution.  There is bronchiectasis bilaterally.  Focal area of consolidation is seen, right upper lobe anteriorly. *Airways are unremarkable. Soft Tissues/Bones: Bilateral gynecomastia is present.  Small nodular lesion projects from the inferior right thyroid gland.  Sagittal projection demonstrates multiple compression fractures L1 moderate to severe, L2 moderate, T9 moderate and T10 minimal.  Bones are demineralized.  These fractures are faintly noted on prior lateral chest radiograph.  Upper Abdomen: The  liver is grossly abnormal, with small overall size, increased density and extensive nodularity throughout the serosal surface. The spleen is mildly enlarged.  There is are extensive unenhanced splenic varices.  Multiple unenhanced gastric and paraesophageal varices are also noted.  Small right renal calculus present     06/13/2014   IMPRESSION: Multifocal airspace disease most likely pneumonia with large pleural effusion primarily on the right.  Chronic bronchiectasis.  Left ventricular  and atrial chamber enlargement could indicate mitral valve disease.  Advanced cirrhotic morphology of the liver with portal hypertension and extensive varices, unenhanced on current CT.  Multiple compression fractures essentially unchanged.  Gynecomastia.  Multiple prior moderate compression fractures.     US Abdomen Complete    07/08/2014   EXAMINATION: COMPLETE ABDOMINAL ULTRASOUND 07/08/2014 6:04 pm  COMPARISON: None  HISTORY: ORDERING SYSTEM PROVIDED HISTORY: CIRRHOSIS TECHNOLOGIST PROVIDED HISTORY: Ordering Physician Provided Reason for Exam: cirrhosis, r/o ascites Acuity: Acute Type of Exam: Initial  FINDINGS: Study is limited by body habitus.  LIVER: Evaluation of the liver is limited.  There is diffuse increased echogenicity with no focal lesion noted.  Somewhat nodular contour of the liver is compatible with known cirrhosis.  BILIARY SYSTEM: Limited imaging of the gallbladder is grossly unremarkable.  Common bile duct is within normal limits measuring 6 mm.  KIDNEYS: Right kidney is nonvisualized.  Left kidney measures 10.1 x 4.9 x 5.3 cm and is grossly unremarkable.  PANCREAS: Pancreas is nonvisualized.  SPLEEN: Spleen measures 10.1 cm.  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  IVC: The IVC is patent.  AORTA: Aorta is obscured.  OTHER: No evidence of ascites.     07/08/2014   IMPRESSION: Limited study as above.  Cirrhosis and portal hypertension.  No evidence of ascites.     Xr Chest Portable    07/08/2014   EXAMINATION: SINGLE VIEW OF THE CHEST   07/07/2014 11:34 pm  COMPARISON: 06/03/2014  HISTORY: ORDERING SYSTEM PROVIDED HISTORY: other TECHNOLOGIST PROVIDED HISTORY: Ordering Physician Provided Reason for Exam: dizziness Acuity: Unknown Type of Exam: Unknown  FINDINGS: Cardiac leads project over the chest.  Diffuse interstitial opacity is demonstrated bilaterally.  Heterogeneous opacity is seen adjacent to the left heart border.  No evidence of pleural effusion.  Negative for pneumothorax. Cardiac and mediastinal silhouettes are similar to prior.  Old posterior left 7th rib fracture.     07/08/2014   IMPRESSION: Mild pulmonary edema.     Vl Extremity Venous Right    07/09/2014   Vascular Lower Extremities DVT Study Procedure  -- PRELIMINARY SONOGRAPHER REPORT --    Demographics    Patient Name      TIMON GEISSINGER    Date of Study     07/09/2014          Gender              Male    Patient Number    [id number]          Date of Birth       Apr 19, 1940    Visit Number      [id number]         Age                 40 year(s)    Accession Number  [id number]           Room Number         1062    Corporate ID      [id number]            Sonographer         Erasmo Leventhal,                                                            [place]  Ordering          Deniece Portela,   Interpreting        [facility]  Physician         MD                  Physician   Procedure  Type of Study:    Veins:Lower Extremities DVT Study, VL EXTREMITY VENOUS DUPLEX RIGHT.    Generic Orders:VL EXTREMITY VENOUS DUPLEX RIGHT.   Tech Comments Right Acute totally occluding deep vein thrombosis involving the right proximal PTV and one gastroc vein zone 5-6. Acute partially occluding deep vein thrombosis involving the popliteal vein and right one gastroc vein zone 5-6. No other evidence of deep vein or superficial vein thrombosis involving the right lower extremity and the left common femoral vein. Calf veins were not well visualized on the right due to patient positioning and edema.  Verbal to  [employer].    Cta Pulmonary With Contrast    06/27/2014   EXAMINATION: CTA OF THE CHEST 06/27/2014 2:53 pm  TECHNIQUE: CTA of the chest was performed after the administration of intravenous contrast.  Multiplanar reformatted images are provided for review.  MIP images are provided for review.  Patient received 100 mL Isovue 370 intravenous contrast.  COMPARISON: CT chest with contrast June 13, 2014.  HISTORY: ORDERING SYSTEM PROVIDED HISTORY: DVT (deep venous thrombosis), unspecified laterality [facility]) TECHNOLOGIST PROVIDED HISTORY: Ordering Physician Provided Reason for Exam: DVT, SOB Injury/Trauma or Illness: Illness/Other Acuity: Acute Type of Exam: Initial Relevant Medica/Surgical History: CHF, cardiomyopathy  FINDINGS: Pulmonary Arteries: Pulmonary arteries are adequately opacified for evaluation.  No evidence of intraluminal filling defect to suggest pulmonary embolism.  Main pulmonary artery is normal in caliber.  Mediastinum: No evidence of mediastinal lymphadenopathy.  Heart is enlarged, similar in appearance.  The heart and pericardium demonstrate no acute abnormality.  There is no acute abnormality of the thoracic aorta. Nodularity of thyroid similar in appearance.  Lungs/pleura: The central airways are patent.  There is a moderate right-sided pleural effusion which appears slightly decreased compared to prior study.  There is mild patchy dependent opacities in the right upper lobe, similar in appearance.  There are dependent subpleural opacities within the lingula and left lower lobe.  There is mild opacity within the right middle lobe which is linear in appearance in appears slightly improved compared to prior study.  2 nodular appearing densities are again seen in the right lower lobe on image number 65, unchanged.  No pneumothorax.  Upper Abdomen: Cirrhotic liver with mildly distended gallbladder is similar in appearance to prior study.  Soft Tissues/Bones: Multiple compression fractures again seen, most  pronounced at the thoracolumbar junction, similar in appearance.  Old right-sided rib fractures are also seen.  No acute bone or soft tissue abnormality.  Gynecomastia is again seen.     06/27/2014   IMPRESSION: No evidence of pulmonary embolism.  Moderate right pleural effusion, minimally decreased.  Persistent patchy areas of airspace opacity which may be related to atelectasis versus pneumonia, slightly improved.  2 nodular appearing densities are seen within the right lower lobe which may be related to the airspace disease or represent lung nodules.  Follow-up recommended with dedicated chest CT in 3 months.  Remote compression fractures again seen.  Cirrhotic liver.     Vl Extremity Venous Bilateral    07/09/2014   Vascular Lower Extremities DVT Study Procedure  -- PRELIMINARY SONOGRAPHER REPORT --    Demographics  Patient Name      JIDENNA FIGGS    Date of Study     07/09/2014          Gender              Male    Patient Number    [id number]          Date of Birth       Feb 08, 1941    Visit Number      [id number]         Age                 41 year(s)    Accession Number  [id number]           Room Number         2778    Corporate ID      [id number]            Sonographer         Erasmo Leventhal,                                                            [facility]    Ordering          Deniece Portela,   Interpreting        [facility]  Physician         MD                  Physician   Procedure  Type of Study:    Veins:Lower Extremities DVT Study, VL EXTREMITY VENOUS DUPLEX RIGHT.   Tech Comments Right Acute totally occluding deep vein thrombosis involving the right proximal PTV and one gastroc vein zone 5-6. Acute partially occluding deep vein thrombosis involving the popliteal vein and right one gastroc vein zone 5-6. No other evidence of deep vein or superficial vein thrombosis involving the right lower extremity and the left common femoral vein. Calf veins were not well visualized on the right due to patient  positioning and edema.  verbal to [employer].    Vl Extremity Venous Bilateral    06/27/2014   Lower Extremities DVT Study   Demographics    Patient Name       AUNDREA HIGGINBOTHAM    Date of Study      06/27/2014        Gender              Male    Patient Number     [id number]        Date of Birth       06-01-1940    Visit Number       [id number]       Age                 69 year(s)    Accession Number   [id number]         Room Number    Corporate ID       [id number]          Sonographer         Erasmo Leventhal,  RVS    Ordering Physician Lacretia Leigh, MD  Interpreting        [facility]                                       Physician           Darlyn Chamber MD,                                                           [facility]   Procedure  Type of Study:    Veins:Lower Extremities DVT Study, VASC EXTREMITY VENOUS DUPLEX BILATERAL.    Vascular Sonographer Report   Additional Indications:Swelliing  Impressions Right Impression Acute partially occluding deep vein thrombosis involving the right set of gastroc veins zone 5-6. No other evidence of deep vein or superficial vein thrombosis involving the right lower extremity. Reflux noted in the right FV. Left Impression No evidence of deep vein or superficial vein thrombosis involving the left lower extremity.  Verbal to Dr. Jeb Levering. Patient released.  Conclusions    Summary    -Acute partially occluding deep vein thrombosis involving the right set of  gastroc veins zone 5-6.  -Reflux noted in the right FV.    Signature    ------------------------------------------------------------------  Electronically signed by Darlyn Chamber MD, [facility] (Interpreting  physician) on 06/27/2014 at 01:38 PM  ------------------------------------------------------------------   Patient Status:Routine. [facility] - Vascular Lab. Technical Quality:Adequate visualization.  Velocities are measured in cm/s ; Diameters are  measured in mm  Right Lower Extremities DVT Study Measurements Right 2D Measurements    STAGING:     No matching staging information was found for the patient.    ASSESSMENT AND PLAN:     1. Right LE DVT.  Continue therapeutic Lovenox for total 2 weeks then transition to coumadin with target INR about 2.  2. Pancytopenia.  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    3. Macrocytosis.     Explained to the wife and son at great length about his condition.  ESA started, plan for MDS per Dr. Al Corpus.  Blood counts will not improve significantly so discharge per symptoms and not based upon blood counts.  OK to discharge from my perspective when OK with primary service.  Needs to see Dr. Al Corpus approximately 14 days after discharge

## 2014-07-14 NOTE — Progress Notes (Signed)
Bedside handoff complete with Corwin Levins . Resting in bed, respirations are easy and even. Denies needs. Call light in reach.  Electronically signed by Daryel November, RN on 07/14/2014 at 7:37 AM

## 2014-07-14 NOTE — Progress Notes (Signed)
Pt showered, gown and linens changed.  Gave scheduled medications.  SBP less than 110, held coreg and inspra per instructions.  Pt has already had 3 BM's today, held 1000 dose of lactulose.  Pt up in chair, wife at bedside.  Pt declined eating breakfast, drank juice.  Pt denies any further needs at this time

## 2014-07-14 NOTE — Progress Notes (Signed)
Department of Internal Medicine  General Internal Medicine   Progress Note      SUBJECTIVE: over all mental status improved     History obtained from chart review and the patient  General ROS: positive for  - fatigue and malaise  negative for - chills, fever or night sweats  Psychological ROS: positive for - anxiety and irritability  negative for - disorientation, hallucinations or hostility  Respiratory ROS: no cough, shortness of breath, or wheezing  Cardiovascular ROS: no chest pain or dyspnea on exertion  Gastrointestinal ROS: positive for - appetite loss and diarrhea  negative for - abdominal pain, blood in stools, hematemesis or melena  Genito-Urinary ROS: no dysuria, trouble voiding, or hematuria  Musculoskeletal ROS: chronic musculoskeletal pain     OBJECTIVE      Medications      Current facility-administered medications: enoxaparin (LOVENOX) injection 80 mg, 1 mg/kg, Subcutaneous, BID  diphenhydrAMINE (BENADRYL) tablet 25 mg, 25 mg, Oral, See Admin Instructions  rifaximin (XIFAXAN) tablet 550 mg, 550 mg, Oral, BID  magnesium oxide (MAG-OX) tablet 400 mg, 400 mg, Oral, BID  citalopram (CELEXA) tablet 10 mg, 10 mg, Oral, Daily  furosemide (LASIX) tablet 80 mg, 80 mg, Oral, BID  potassium chloride SA (K-DUR;KLOR-CON M) tablet 40 mEq, 40 mEq, Oral, PRN **OR** potassium chloride 20 MEQ/15ML (10%) oral solution 40 mEq, 40 mEq, Oral, PRN **OR** potassium chloride 10 mEq/100 mL IVPB (Peripheral Line), 10 mEq, Intravenous, PRN  sodium chloride flush 0.9 % injection 10 mL, 10 mL, Intravenous, 2 times per day  sodium chloride flush 0.9 % injection 10 mL, 10 mL, Intravenous, PRN  acetaminophen (TYLENOL) tablet 650 mg, 650 mg, Oral, Q4H PRN  HYDROcodone-acetaminophen (NORCO) 5-325 MG per tablet 1 tablet, 1 tablet, Oral, Q4H PRN **OR** HYDROcodone-acetaminophen (NORCO) 5-325 MG per tablet 2 tablet, 2 tablet, Oral, Q4H PRN  morphine (PF) injection 2 mg, 2 mg, Intravenous, Q2H PRN **OR** morphine (PF) injection 4 mg, 4  mg, Intravenous, Q2H PRN  magnesium hydroxide (MILK OF MAGNESIA) 400 MG/5ML suspension 30 mL, 30 mL, Oral, Daily PRN  ondansetron (ZOFRAN) injection 4 mg, 4 mg, Intravenous, Q6H PRN  famotidine (PEPCID) tablet 20 mg, 20 mg, Oral, BID PRN  eplerenone (INSPRA) tablet 50 mg, 50 mg, Oral, Daily  lisinopril (PRINIVIL;ZESTRIL) tablet 5 mg, 5 mg, Oral, Nightly  carvedilol (COREG) tablet 3.125 mg, 3.125 mg, Oral, Daily with breakfast **AND** carvedilol (COREG) tablet 6.25 mg, 6.25 mg, Oral, QPM  diclofenac (VOLTAREN) 0.1 % ophthalmic solution 1 drop, 1 drop, Both Eyes, Daily  lactulose (CHRONULAC) 10 GM/15ML solution 20 g, 20 g, Oral, Q4H    Physical      VITALS:  BP 88/52 mmHg   Pulse 70   Temp(Src) 98.4 ??F (36.9 ??C) (Oral)   Resp 15   Ht 5\' 5"  (1.651 m)   Wt 177 lb 4.8 oz (80.423 kg)   BMI 29.50 kg/m2   SpO2 93%  TEMPERATURE:  Current - Temp: 98.4 ??F (36.9 ??C); Max - Temp  Avg: 98.6 ??F (37 ??C)  Min: 98.3 ??F (36.8 ??C)  Max: 99.4 ??F (37.4 ??C)  RESPIRATIONS RANGE: Resp  Avg: 15.8  Min: 15  Max: 16  PULSE RANGE: Pulse  Avg: 74.8  Min: 70  Max: 80  BLOOD PRESSURE RANGE:  Systolic (42VZD), GLO:756 mmHg, Min:88 mmHg, EPP:295 mmHg  ; Diastolic (18ACZ), YSA:63 mmHg, Min:52 mmHg, Max:66 mmHg    PULSE OXIMETRY RANGE: SpO2  Avg: 93.3 %  Min: 92 %  Max: 94 %  24HR INTAKE/OUTPUT:      Intake/Output Summary (Last 24 hours) at 07/14/14 2049  Last data filed at 07/14/14 1330   Gross per 24 hour   Intake    480 ml   Output      0 ml   Net    480 ml     CONSTITUTIONAL:  fatigued, alert, cooperative, mild distress and appears older than stated age  NECK:  supple, symmetrical, trachea midline, skin normal and no stridor  LUNGS:  Vesicular , no wheezes   CARDIOVASCULAR:  normal apical pulses, regular rate and rhythm, normal S1 and S2 and no S3  ABDOMEN:  Soft BS + non tender  No hepatomegaly  Mild ascites   MUSCULOSKELETAL:  Trace edema   NEUROLOGIC:  No asterixis , no motor weakness   SKIN:  Warm and dry  and no bruising or bleeding    Data       No results found for: PHART, PO2ART, PCO2ART, HCO3ART, BEART, O2SATART    Lab Results   Component Value Date    NA 139 07/14/2014    K 3.9 07/14/2014    CL 105 07/14/2014    CO2 29 07/14/2014    BUN 13 07/14/2014    CREATININE 0.7 07/14/2014    GLUCOSE 95 07/14/2014    CALCIUM 8.2 07/14/2014     Lab Results   Component Value Date    WBC 2.4 07/14/2014    HGB 8.3 07/14/2014    HCT 25.0 07/14/2014    MCV 111.3 07/14/2014    PLT 48 07/14/2014         Lab Results   Component Value Date    INR 1.28* 07/07/2014    PROTIME 14.7* 07/07/2014       ASSESSMENT AND PLAN      Patient Active Problem List   Diagnosis   ??? Erectile dysfunction   ??? Thrombocytopenia   ??? Edema   ??? BPH (benign prostatic hyperplasia)   ??? Alpha-1-antitrypsin deficiency   ??? Cirrhosis, nonalcoholic ([place])   ??? Mild CAD   ??? Chronic systolic CHF (congestive heart failure) ([place])   ??? Non-ischemic cardiomyopathy ([place])   ??? Lumbar compression fracture ([place])   ??? Chronic low back pain   ??? Osteopenia   ??? Coagulopathy ([place])   ??? Abnormal EKG   ??? Visual changes   ??? Fixed pupil of right eye   ??? Anemia   ??? Hemispheric carotid artery syndrome   ??? CAD in native artery   ??? Hyperlipidemia   ??? 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 Bronchiectasis without complication ([place])   ??? Benign essential HTN   ??? Hepatic encephalopathy (HCC)   ??? Ataxia   ??? Pancytopenia ([place])   ??? DVT (deep venous thrombosis) (H[facility])     Watch BP carefully  , decrease Coreg if necessary and cut back on Furosemide to 40 mg BID if systilic BP falls below 90 mm Hg   Hematology  comanagement for Pancytopenia   K sliding scale    Rifaximin and  Chronulac

## 2014-07-15 LAB — CBC
Hematocrit: 24.3 % — ABNORMAL LOW (ref 40.5–52.5)
Hemoglobin: 8.2 g/dL — ABNORMAL LOW (ref 13.5–17.5)
MCH: 36.8 pg — ABNORMAL HIGH (ref 26.0–34.0)
MCHC: 33.6 g/dL (ref 31.0–36.0)
MCV: 109.4 fL — ABNORMAL HIGH (ref 80.0–100.0)
MPV: 8.5 fL (ref 5.0–10.5)
Platelets: 44 10*3/uL — ABNORMAL LOW (ref 135–450)
RBC: 2.22 M/uL — ABNORMAL LOW (ref 4.20–5.90)
RDW: 25.3 % — ABNORMAL HIGH (ref 12.4–15.4)
WBC: 2 10*3/uL — ABNORMAL LOW (ref 4.0–11.0)

## 2014-07-15 MED ORDER — ENOXAPARIN SODIUM 80 MG/0.8ML SC SOLN
80 MG/0.8ML | INJECTION | Freq: Two times a day (BID) | SUBCUTANEOUS | Status: DC
Start: 2014-07-15 — End: 2014-07-19

## 2014-07-15 MED FILL — LISINOPRIL 5 MG PO TABS: 5 MG | ORAL | Qty: 1

## 2014-07-15 MED FILL — MAGNESIUM OXIDE 400 (241.3 MG) MG PO TABS: 400 (241.3 Mg) MG | ORAL | Qty: 1

## 2014-07-15 MED FILL — LACTULOSE 10 GM/15ML PO SOLN: 10 GM/15ML | ORAL | Qty: 30

## 2014-07-15 MED FILL — EPLERENONE 25 MG PO TABS: 25 MG | ORAL | Qty: 2

## 2014-07-15 MED FILL — LOVENOX 80 MG/0.8ML SC SOLN: 80 MG/0.8ML | SUBCUTANEOUS | Qty: 0.8

## 2014-07-15 MED FILL — XIFAXAN 550 MG PO TABS: 550 MG | ORAL | Qty: 1

## 2014-07-15 MED FILL — NORMAL SALINE FLUSH 0.9 % IV SOLN: 0.9 % | INTRAVENOUS | Qty: 20

## 2014-07-15 MED FILL — FUROSEMIDE 40 MG PO TABS: 40 MG | ORAL | Qty: 2

## 2014-07-15 MED FILL — CITALOPRAM HYDROBROMIDE 20 MG PO TABS: 20 MG | ORAL | Qty: 1

## 2014-07-15 MED FILL — NORMAL SALINE FLUSH 0.9 % IV SOLN: 0.9 % | INTRAVENOUS | Qty: 10

## 2014-07-15 MED FILL — CARVEDILOL 3.125 MG PO TABS: 3.125 MG | ORAL | Qty: 1

## 2014-07-15 NOTE — Progress Notes (Signed)
Bedside handoff completed with Larene Beach, RN. Pt awake in chair. Denies needs at this time. Call light within reach. Wife in room.

## 2014-07-15 NOTE — Progress Notes (Signed)
Routine VSS.  Patient's wife refused lactulose.  Given eyedrops.  Denies pain at this time.  Call light in reach and fall precautions in place.  Family refusing bed alarm.Electronically signed by Dede Query, RN on 07/15/2014 at 6:41 AM

## 2014-07-15 NOTE — Progress Notes (Signed)
Pt given d/c instructions, education, 1 prescription which was filled in our outpatient pharmacy and call placed to transport staff to take pt to main lobby in wheel chair.  Call light in reach, will continue to monitor.

## 2014-07-15 NOTE — Progress Notes (Signed)
Occupational Therapy  Daily Treatment Note  Date: 07/15/2014    Patient Name: Andrew [name]  MWU:[id number]     DOB: Dec 15, 1940     Restrictions  Restrictions/Precautions  Restrictions/Precautions: Fall Risk  Required Braces or Orthoses?: No  Position Activity Restriction  Other position/activity restrictions: DVT R LE but therapeutic, neutropenic precautions, alarm not on when spouse in room per Judson Roch RN  Subjective   General  Chart Reviewed: Yes  Additional Pertinent Hx: HTN, BPH, cirrhosis (non-alcoholic), compression fx, CAD, CHF, DVT, cancer, leukopenia  Response to previous treatment: Patient with no complaints from previous session  Family / Caregiver Present: Yes (spouse)  Diagnosis: dizzy  Subjective  Subjective: Patient in bed finishing breakfast  Pain Assessment  Patient Currently in Pain: Denies  Vital Signs  Patient Currently in Pain: Denies   Orientation  Orientation  Overall Orientation Status: Within Functional Limits  Objective    ADL  Grooming: Supervision;Verbal cueing (OT did assist with shaving due to patient on blood thinner)  UE Bathing: Supervision (seated in shower)  LE Bathing: Stand by assistance (seated in shower for LE, standing for peri area)  UE Dressing: Stand by assistance  LE Dressing: Increased time to complete;Stand by assistance (socks and pull up)  Toileting: Stand by assistance  Transfer: Contact guard assistance (no device)  Additional Comments: Patient sup to sit from bed supervision, CGA sit to stand.  Functional mobility to bathroom for grooming with supervision at sink.  CGA for shower transfers, CGA with commode transfers.  Showering UB supervision, LB SBA with use of grab bars. UE dressing SBA, LE dressing SBA        Balance  Sitting Balance: Supervision  Standing Balance: Contact guard assistance  Standing Balance  Time: @ 5 minutes x 2  Sit to stand: Stand by assistance  Stand to sit: Stand by assistance  Functional Mobility  Assist Level: Contact guard  assistance  Functional Mobility Comments: no device  Toilet Transfers  Toilet - Technique: Ambulating  Equipment Used: Standard toilet  Transfer Level: Stand by assistance  Toilet Transfers Comments: Grab bar on right  Bed mobility  Rolling: Supervision  Supine to Sit: Supervision  Bed to Chair: Contact guard assistance (no device)  Transfers  Stand Step Transfers: Stand by assistance  Sit to stand: Stand by assistance  Stand to sit: Stand by assistance                       Cognition  Overall Cognitive Status: Impaired  Arousal/Alertness: Appropriate responses to stimuli  Following Directions: Follows two step commands  Safety Judgement: Decreased awareness of need for assistance  Insights: Decreased awareness of deficits  Sequencing and Organization: Assistance required to identify errors made  Additional Comments:                                        Assessment   Assessment: Decreased functional mobility ;Decreased ADL status;Decreased high-level IADLs;Decreased balance  Comments: Pt is not at baseline level of function and will benefit from skilled OT in order to address the above limitations. Recommend ARU for d/c, however [employer] has denied appeal. WIfe and patient agreeable to SNF-- Patient would need d/c to a SNF if ARU is not accepted.   Treatment Diagnosis: Decreased functional mobility, ADL/IADL status, balance related to dizzy  Rehab Potential: Good  Recommendations: Inpatient Rehab;Patient would benefit from continued  therapy after discharge;SNF consult  Goal Formulation: Patient;Family  Requires OT Follow Up: Yes  Total Treatment Time: 62  Timed Code Treatment Minutes: 62 Minutes  Response to Treatment  Response to Treatment: Patient Tolerated treatment well  Safety Devices  Safety Devices in place: Yes  Type of devices: Left in chair;Call light within reach;Nurse notified;Patient at risk for falls;Gait belt;All fall risk precautions in place (Spouse at bedside.  Aware spouse refused  alarm.)  Restraints  Initially in place: No  OT D/C Equipment  Equipment Needed: Yes  Shower Chair: to increase safety and independence in bathing          Discharge Recommendations:  Inpatient Rehab, Patient would benefit from continued therapy after discharge, SNF consult     Plan   Plan  Times per week: 2-5x  Times per day: Daily  Patient Education: Role of OT, OT evaluation, safe functional mobility, POC, d/c recommendations    G-Code       Short term goals  Time Frame for Short term goals: Discharge  Short term goal 1: Pt will be supervision for all UB ADLs.- supervision and cues to sequence grooming task  Short term goal 2: Pt will be supervision for all LB ADLs-- SBA  Short term goal 3: Pt will be SBA for functional mobility with AD as needed-- CGA    Short term goal 4: Pt will be supervision for functional transfers to/from all ADL surfaces-- CGA   Long term goals  Time Frame for Long term goals : STG=LTG       Nigel Mormon, OT  License and [place] Number: Diona Fanti, OTR/L [phone]

## 2014-07-15 NOTE — Telephone Encounter (Signed)
New patient scheduled appt noted.

## 2014-07-15 NOTE — Progress Notes (Signed)
Physical Therapy  Facility/Department: [facility]  Daily Treatment Note  NAME: Andrew [name]  DOB: 10-08-1940  MRN: [id number]    Date of Service: 07/15/2014    Patient Diagnosis(es):   Patient Active Problem List    Diagnosis Date Noted   ??? Erectile dysfunction 04/30/2009     Priority: Low   ??? DVT (deep venous thrombosis) ([place]) 07/10/2014   ??? 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 Ataxia 07/07/2014   ??? Pancytopenia ([place]) 07/07/2014   ??? Benign essential HTN 07/03/2014   ??? Bronchiectasis without complication ([place]) 25/95/6387   ??? 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 Anemia 04/02/2014   ??? Hemispheric carotid artery syndrome    ??? CAD in native artery    ??? Hyperlipidemia    ??? Abnormal EKG 04/01/2014   ??? Visual changes 04/01/2014   ??? Lumbar compression fracture ([place]) 01/14/2014   ??? Chronic low back pain 01/14/2014   ??? Osteopenia 01/14/2014   ??? Coagulopathy ([place]) 01/14/2014   ??? Non-ischemic cardiomyopathy ([place]) 11/06/2013   ??? Chronic systolic CHF (congestive heart failure) ([place]) 10/18/2013   ??? Mild CAD    ??? Cirrhosis, nonalcoholic ([place])    ??? 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 BPH (benign prostatic hyperplasia) 08/01/2009   ??? Thrombocytopenia 04/30/2009   ??? Edema 04/30/2009       Past Medical History   Diagnosis Date   ??? Hypertension    ??? Kidney stones    ??? Alpha 1 antitrypsin deficiency    ??? Thrombocytopenia ([place]) 04/30/2009   ??? BPH (benign prostatic hyperplasia)    ??? 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 Chronic systolic CHF (congestive heart failure) ([place]) 10/18/2013   ??? CAD (coronary artery disease)      Non-obstructive   ??? Compression fracture of lumbar vertebra ([facility])      multiple, lumbar   ??? Leukopenia    ??? Anemia    ??? Pneumonia    ??? DVT (deep venous thrombosis) ([place])    ??? Cancer ([place])    ??? Hyperlipidemia      Past Surgical History   Procedure Laterality Date   ??? Av fistula repair  1970's   ??? Colonoscopy  4/12     5y   ???  Cardiac catheterization     ??? Eye surgery  04/12/2014     Laser Eye Surgery for glaucoma   ??? Cosmetic surgery  04/2013     eye lids     Restrictions  Restrictions/Precautions  Restrictions/Precautions: Fall Risk  Required Braces or Orthoses?: No  Position Activity Restriction  Other position/activity restrictions: DVT R LE but therapeutic, neutropenic precautions, alarm not on when spouse in room per Sarah RN  Subjective   General  Chart Reviewed: Yes  Family / Caregiver Present: Yes (wife )  Subjective  Subjective: pt pleasant and agreeable to therapy   General Comment  Comments: pt presented seated in chair upon PT arrival   Pain Screening  Patient Currently in Pain: Denies  Vital Signs  Patient Currently in Pain: Denies       Orientation  Orientation  Overall Orientation Status: Within Functional Limits  Objective      Transfers  Sit to Stand: Stand by assistance  Stand to sit: Stand by assistance  Ambulation  Ambulation?: Yes  Ambulation 2  Surface - 2: level tile  Device 2: No device  Assistance 2: Stand by assistance;Contact guard assistance  Quality of Gait 2: improved erect posture noted this date, bilateral ER, decreased foot clearance bilaterally   Distance: 115'  Comments: no LOB  Neuromuscular Education  Neuromuscular Comments: static standing NBOS x20 sec, static standing staggered/tandem stance R/L x30 sec, forward/backward ambulation 2x25' with transition from carpet/tile, side stepping 2x25' with CGA   Balance  Posture: Fair (kyphotic posture with forward )  Sitting - Static: Good  Sitting - Dynamic: Good  Standing - Static: Good  Standing - Dynamic: Fair     Assessment   Assessment: Decreased functional mobility ;Decreased strength;Decreased endurance;Decreased balance  Assessment: Pt demonstrates improvement in functional mobility transfers this date evidenced by decreased level of assist. Pt demonstrates decreased balance with higher level balance tasks requiring CGA-min assist to prevent LOB. Pt  benefit from further PT to improve mobility and decrease risk of falling.   Treatment Diagnosis: decreased strength, decreased activity tolerance, impaired balance, impaired gait related to dx  Prognosis: Good  Requires PT Follow Up: Yes  Total Treatment Time: 23  Timed Code Treatment Minutes: 23 Minutes  Activity Tolerance  Activity Tolerance: Patient Tolerated treatment well  PT D/C Equipment  Equipment Needed: Yes  Walker: Rolling       Discharge Recommendations:  [place] (pt denied ARU; pt benefit from short stay ECF with PT to maximize safety prior to returning home )      Goals  Short term goals  Time Frame for Short term goals: prior to discharge  Short term goal 1: pt will be SUPV for bed mobility  Short term goal 2: pt will be SBA for transfers- goal met 5/9  Short term goal 3: pt will be able to ambulate 20' with or without AAD with CGA - goal met 5/9  Short term goal 4: pt will be able to ascend/descend 4 steps with HR with CGA   Long term goals  Time Frame for Long term goals : LTG=STG  Long term goal 1: pt will be SUPV for transfers  Long term goal 2: pt will be able to ambulate 150' with SUPV   2 goals met     Plan    Plan  Times per week: 2-5x  Times per day: Daily  Current Treatment Recommendations: Strengthening;Balance Training;Functional Mobility Training;Endurance Training;Gait Training;Neuromuscular Re-education;Pain Management;Safety Education & Training;Patient/Caregiver Education & Training;Equipment Evaluation, Education, & procurement  Patient Education: role of PT and POC, discharge recommendations, safety with transfers  Safety Devices  Safety Devices in place: Yes  Type of devices: All fall risk precautions in place;Call light within reach;Chair alarm in place;Gait belt;Patient at risk for falls;Left in chair;Nurse notified  Restraints  Initially in place: No     Crisoforo Oxford, PT  License and [place] Number: Crisoforo Oxford PT DPT  [phone]

## 2014-07-15 NOTE — Progress Notes (Addendum)
Patient resting in bed comfortably with respirations easy and unlabored. Wife at the bedside, refusing bed alarm and 0200 scheduled lactulose. Call light within reach. . Will continue to monitor.

## 2014-07-15 NOTE — Progress Notes (Signed)
Clinical Pharmacy Note:    Patient and wife counseled on Lovenox self-administration.    No contraindications and patient is able to understand regimen and carry out self-injections.    Lovenox kit provided containing Lovenox booklet, administration instructions, dosing diary, alcohol pads, and sharps container.   Patient is aware of signs/symptoms of abnormal bleeding as well as DVT/PE.    Also checked pt's co-pay for the lovenox 80 mg SubQ BID x 14 days and it was $12 for the 28 syringes. Outpatient Pharmacy only had #20 syringes available, but informed pt's wife that they could come back and get the remaining #8 syringes at any time, which she was ok with. Please call with any further questions.    Thanks!  Dionisio Paschal, PharmD   x [phone]

## 2014-07-15 NOTE — Progress Notes (Signed)
Pt sitting up in the chair at the bedside watching TV.  Pt denies needs at this time.  Call light in reach, will continue to monitor.

## 2014-07-15 NOTE — Care Coordination-Inpatient (Signed)
Discharge Planning:  Received return call from Andrew [name] at [facility]. She has been informed by [facility] nurse, that the patient's case has been sent for MEDICAL REVIEW. Awaiting decision.     Mardy Lucier A. Gilford Rile, MSW,LSW

## 2014-07-15 NOTE — Progress Notes (Signed)
[place]. Received      Home care referral. Will follow.  Spoke with pt and re: home care and plan of care. Agreeable . Will follow for home care.  [place] of discharge with home care. Home care orders faxed to [facility] [phone] fax [phone]

## 2014-07-15 NOTE — Telephone Encounter (Signed)
Records are in EPIC.  Chart complete.

## 2014-07-15 NOTE — Progress Notes (Signed)
Bedside handoff completed with Jenn, RN. Pt asleep in bed. Breathing easy and unlabored. No s/s of distress. Bed alarm engaged. Call light within reach.

## 2014-07-15 NOTE — Progress Notes (Signed)
Patient in bed, eyes closed.  Respirations are easy and even.  No distress is noted.  Call light in reach and fall precautions in place.  Wife refusing bed alarm.Electronically signed by Dede Query, RN on 07/15/2014 at 4:31 AM

## 2014-07-15 NOTE — Progress Notes (Signed)
Shift assessment complete. Aox4. R side lung sounds clear, crackles noted to L lower lobe. Bowel sounds active. Abd soft, rounded, nontender. Pt denies pain. Denies needs at this time. Wife at bedside. Will continue to monitor.

## 2014-07-15 NOTE — Progress Notes (Signed)
Pt taken to main lobby in wheel chair by transport staff to be taken home by private vehicle.

## 2014-07-15 NOTE — Progress Notes (Signed)
Routine VSS.  Patient had BM.  Wife does not want patient to have lactulose at 0200.  Patient and wife informed that Lauren RN will be assuming care for patient.  Agreed to [place].  Call light in reach and family at bedside.  Wife refusing bed alarm.Electronically signed by Dede Query, RN on 07/15/2014 at 12:01 AM

## 2014-07-15 NOTE — Progress Notes (Signed)
Nutrition Assessment    Type and Reason for Visit: Initial (LOS assessment )    Nutrition Recommendations:   No recommendations at this time     Malnutrition Assessment:  ?? Malnutrition Status: No malnutrition    Nutrition Diagnosis:   ?? Problem: No nutrition diagnosis at this time    Nutrition Assessment:  ?? Subjective Assessment: Pt reports good PO intake throughout admission, consuming at least 50% of meals. Pt follows low sodium diet at home and has no questions regarding this. Pt with no nutrition questions or concerns; hopes to d/c today.   ?? Current Nutrition Therapies:  ?? Oral Diet Orders: Low Microbial, No Added Salt (3-4gm)   ?? Oral Diet intake: 51-75%, 76-100%  ?? Oral Nutrition Supplement (ONS) Orders: None  ?? Anthropometric Measures:  ?? Ht: 5\' 5"  (165.1 cm)   ?? Current Body Wt: 175 lb (79.379 kg)  ?? Ideal Body Wt: 136 lb (61.689 kg)   ?? BMI Classification: BMI 25.0 - 29.9 Overweight    Estimated Intake vs Estimated Needs: Intake Meets Needs    Nutrition Risk Level: Moderate    Nutrition Interventions:   Continue current diet  Education Not Indicated    Nutrition Evaluation:   ?? Evaluation: Goals set   ?? Goals: Consume at least 50% of meals     ?? Monitoring: Meals Intake, Tolerance to Diet    See Adult Nutrition Doc Flowsheet for more detail.     Electronically signed by Greer Ee, RD, LD on 07/15/14 at 1:55 PM    Contact Number: [phone]

## 2014-07-15 NOTE — Care Coordination-Inpatient (Signed)
Discharge Planning:  SW received a call from [place]. Patient has been denied for skilled nursing facility also. SW met with patient and his wife to inform of decision. She expressed understanding and is agreeable to home with home care. They have no preference for a provider. Referred to [facility]. Patient has no dme or other needs. Patient does have a discharge order.     Andrew [name] A. Gilford Rile, MSW,LSW

## 2014-07-15 NOTE — Telephone Encounter (Signed)
np, dx pancytopenia/DVT/ anemia, ref by [facility] FF IP unit, labs done, bone marrow bx done- another BM may be ordered per Dr. Al Corpus, ins- [employer], np appt scheduled for 5.13.16 at 2:15p.m. to allow time for patient to get out of hospital. aes

## 2014-07-15 NOTE — Progress Notes (Signed)
Progress Note - Dr. Tasia Catchings - Internal Medicine  PCP: Alecia Lemming, DO [place] / [place] [address] [phone]    Hospital Day: 7  Code Status: Full Code  Current Diet: DIET GENERAL; No Added Salt (3-4 GM); Low Microbial        CC: follow up on medical issues    Subjective:   Glenda Kunst is a 74 y.o. male.    He denies problems    Doing ok  More alert  Ready for ecf    He denies chest pain, denies shortness of breath, denies nausea,  denies emesis.    10 system Review of Systems is reviewed with patient, and pertinent positives are listed here: None . Otherwise, Review of systems is negative.     I have reviewed the patient's medical and social history in detail and updated the computerized patient record.  To recap: He  has a past medical history of Hypertension; Kidney stones; Alpha 1 antitrypsin deficiency; Thrombocytopenia ([place]); BPH (benign prostatic hyperplasia); Cirrhosis, nonalcoholic ([place]); Chronic systolic CHF (congestive heart failure) ([place]); CAD (coronary artery disease); Compression fracture of lumbar vertebra ([place]); Leukopenia; Anemia; Pneumonia; DVT (deep venous thrombosis) ([place]); Cancer ([place]); and Hyperlipidemia.. He  has past surgical history that includes AV fistula repair (1970's); Colonoscopy (4/12); Cardiac catheterization; eye surgery (04/12/2014); and Cosmetic surgery (04/2013).[name] He  reports that he has never smoked. He has never used smokeless tobacco. He reports that he does not drink alcohol or use illicit drugs..        Active Hospital Problems    Diagnosis Date Noted   ??? DVT (deep venous thrombosis) ([place]) [I82.409] 07/10/2014   ??? Hepatic encephalopathy ([place]) [K72.90] 07/07/2014   ??? Ataxia [R27.0] 07/07/2014   ??? Pancytopenia ([place]) [D61.818] 07/07/2014   ??? Benign essential HTN [I10] 07/03/2014   ??? Hyperlipidemia [E78.5]    ??? Cirrhosis, nonalcoholic (HCC) [B14.78]        Current facility-administered medications: enoxaparin (LOVENOX) injection 80 mg, 1 mg/kg, Subcutaneous,  BID  diphenhydrAMINE (BENADRYL) tablet 25 mg, 25 mg, Oral, See Admin Instructions  rifaximin (XIFAXAN) tablet 550 mg, 550 mg, Oral, BID  magnesium oxide (MAG-OX) tablet 400 mg, 400 mg, Oral, BID  citalopram (CELEXA) tablet 10 mg, 10 mg, Oral, Daily  furosemide (LASIX) tablet 80 mg, 80 mg, Oral, BID  potassium chloride SA (K-DUR;KLOR-CON M) tablet 40 mEq, 40 mEq, Oral, PRN **OR** potassium chloride 20 MEQ/15ML (10%) oral solution 40 mEq, 40 mEq, Oral, PRN **OR** potassium chloride 10 mEq/100 mL IVPB (Peripheral Line), 10 mEq, Intravenous, PRN  sodium chloride flush 0.9 % injection 10 mL, 10 mL, Intravenous, 2 times per day  sodium chloride flush 0.9 % injection 10 mL, 10 mL, Intravenous, PRN  acetaminophen (TYLENOL) tablet 650 mg, 650 mg, Oral, Q4H PRN  HYDROcodone-acetaminophen (NORCO) 5-325 MG per tablet 1 tablet, 1 tablet, Oral, Q4H PRN **OR** HYDROcodone-acetaminophen (NORCO) 5-325 MG per tablet 2 tablet, 2 tablet, Oral, Q4H PRN  morphine (PF) injection 2 mg, 2 mg, Intravenous, Q2H PRN **OR** morphine (PF) injection 4 mg, 4 mg, Intravenous, Q2H PRN  magnesium hydroxide (MILK OF MAGNESIA) 400 MG/5ML suspension 30 mL, 30 mL, Oral, Daily PRN  ondansetron (ZOFRAN) injection 4 mg, 4 mg, Intravenous, Q6H PRN  famotidine (PEPCID) tablet 20 mg, 20 mg, Oral, BID PRN  eplerenone (INSPRA) tablet 50 mg, 50 mg, Oral, Daily  lisinopril (PRINIVIL;ZESTRIL) tablet 5 mg, 5 mg, Oral, Nightly  carvedilol (COREG) tablet 3.125 mg, 3.125 mg, Oral, Daily with breakfast **AND**  carvedilol (COREG) tablet 6.25 mg, 6.25 mg, Oral, QPM  diclofenac (VOLTAREN) 0.1 % ophthalmic solution 1 drop, 1 drop, Both Eyes, Daily  lactulose (CHRONULAC) 10 GM/15ML solution 20 g, 20 g, Oral, Q4H         Objective:  BP 108/65 mmHg   Pulse 79   Temp(Src) 98.7 ??F (37.1 ??C) (Oral)   Resp 14   Ht 5\' 5"  (1.651 m)   Wt 175 lb 11.2 oz (79.697 kg)   BMI 29.24 kg/m2   SpO2 92%     Patient Vitals for the past 24 hrs:   BP Temp Temp src Pulse Resp SpO2 Weight   07/15/14  0906 108/65 mmHg 98.7 ??F (37.1 ??C) - 79 14 92 % -   07/15/14 0705 - - - - - - 175 lb 11.2 oz (79.697 kg)   07/15/14 0632 102/58 mmHg 98.8 ??F (37.1 ??C) - 84 15 90 % -   07/14/14 2359 102/62 mmHg 98.5 ??F (36.9 ??C) - 82 14 92 % -   07/14/14 2227 106/65 mmHg - - - - - -   07/14/14 2036 (!) 88/52 mmHg 98.4 ??F (36.9 ??C) - 70 15 93 % -   07/14/14 1607 103/54 mmHg 98.4 ??F (36.9 ??C) Oral 70 16 94 % -   07/14/14 1316 122/62 mmHg 98.3 ??F (36.8 ??C) Oral 78 16 94 % -     Patient Vitals for the past 96 hrs (Last 3 readings):   Weight   07/15/14 0705 175 lb 11.2 oz (79.697 kg)   07/12/14 0634 177 lb 4.8 oz (80.423 kg)           Intake/Output Summary (Last 24 hours) at 07/15/14 0925  Last data filed at 07/14/14 2228   Gross per 24 hour   Intake    960 ml   Output      0 ml   Net    960 ml         Physical Exam:   S1, S2 normal, no murmur, rub or gallop, regular rate and rhythm  clear to auscultation bilaterally  abdomen is soft without significant tenderness, masses, organomegaly or guarding  extremities normal, atraumatic, no cyanosis or edema    Labs:  Lab Results   Component Value Date    WBC 2.0* 07/15/2014    HGB 8.2* 07/15/2014    HCT 24.3* 07/15/2014    PLT 44* 07/15/2014    CHOL 101 04/02/2014    TRIG 83 04/02/2014    HDL 22* 04/02/2014    ALT 20 07/09/2014    AST 35 07/09/2014    NA 139 07/14/2014    K 3.9 07/14/2014    CL 105 07/14/2014    CREATININE 0.7* 07/14/2014    BUN 13 07/14/2014    CO2 29 07/14/2014    TSH 2.15 08/15/2013    PSA 0.55 08/15/2013    INR 1.28* 07/07/2014    LABA1C 5.0 01/10/2014    LABMICR Not Indicated 07/08/2014     Lab Results   Component Value Date    TROPONINI <0.01 07/07/2014       Recent Imaging Results are Reviewed:  Ct Head Wo Contrast    07/07/2014   EXAMINATION: CT OF THE HEAD WITHOUT CONTRAST  07/07/2014 11:11 pm  TECHNIQUE: CT of the head was performed without the administration of intravenous contrast.  COMPARISON: 04/01/2014  HISTORY: ORDERING SYSTEM PROVIDED HISTORY: dizzy TECHNOLOGIST  PROVIDED HISTORY: Ordering Physician Provided Reason for Exam: dizzy  FINDINGS: BRAIN/VENTRICLES: There  is no acute intracranial hemorrhage, mass effect or midline shift.  No abnormal extra-axial fluid collection.  The gray-white differentiation is maintained without evidence of an acute infarct.  There is no evidence of hydrocephalus.  ORBITS: The visualized portion of the orbits demonstrate no acute abnormality.  SINUSES: There is minimal mucosal thickening in the left maxillary sinus. The remaining visualized paranasal sinuses are clear.  The mastoid air cells are unremarkable.  SOFT TISSUES/SKULL:  No acute abnormality of the visualized skull or soft tissues.     07/07/2014   IMPRESSION: No acute intracranial abnormality.     US Abdomen Complete    07/08/2014   EXAMINATION: COMPLETE ABDOMINAL ULTRASOUND 07/08/2014 6:04 pm  COMPARISON: None  HISTORY: ORDERING SYSTEM PROVIDED HISTORY: CIRRHOSIS TECHNOLOGIST PROVIDED HISTORY: Ordering Physician Provided Reason for Exam: cirrhosis, r/o ascites Acuity: Acute Type of Exam: Initial  FINDINGS: Study is limited by body habitus.  LIVER: Evaluation of the liver is limited.  There is diffuse increased echogenicity with no focal lesion noted.  Somewhat nodular contour of the liver is compatible with known cirrhosis.  BILIARY SYSTEM: Limited imaging of the gallbladder is grossly unremarkable.  Common bile duct is within normal limits measuring 6 mm.  KIDNEYS: Right kidney is nonvisualized.  Left kidney measures 10.1 x 4.9 x 5.3 cm and is grossly unremarkable.  PANCREAS: Pancreas is nonvisualized.  SPLEEN: Spleen measures 10.1 cm.  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  IVC: The IVC is patent.  AORTA: Aorta is obscured.  OTHER: No evidence of ascites.     07/08/2014   IMPRESSION: Limited study as above.  Cirrhosis and portal hypertension.  No evidence of ascites.     Xr Chest Portable    07/08/2014   EXAMINATION: SINGLE VIEW OF THE CHEST  07/07/2014 11:34 pm  COMPARISON: 06/03/2014  HISTORY:  ORDERING SYSTEM PROVIDED HISTORY: other TECHNOLOGIST PROVIDED HISTORY: Ordering Physician Provided Reason for Exam: dizziness Acuity: Unknown Type of Exam: Unknown  FINDINGS: Cardiac leads project over the chest.  Diffuse interstitial opacity is demonstrated bilaterally.  Heterogeneous opacity is seen adjacent to the left heart border.  No evidence of pleural effusion.  Negative for pneumothorax. Cardiac and mediastinal silhouettes are similar to prior.  Old posterior left 7th rib fracture.     07/08/2014   IMPRESSION: Mild pulmonary edema.     Vl Extremity Venous Right    07/10/2014   Lower Extremities DVT Study   Demographics    Patient Name      BRANDLEY ALDRETE    Date of Study     07/09/2014          Gender              Male    Patient Number    [id number]          Date of Birth       January 10, 1941    Visit Number      [id number]         Age                 80 year(s)    Accession Number  [id number]           Room Number         0932    Corporate ID      [id number]            Sonographer         Erasmo Leventhal,  RVS    Ordering          Deniece Portela,   Interpreting        [facility]  Physician         MD                  Physician           Peggyann Shoals,                                                            MD, [facility] [place]   Procedure  Type of Study:    Veins:Lower Extremities DVT Study, VL EXTREMITY VENOUS DUPLEX RIGHT.    Generic Orders:VL EXTREMITY VENOUS DUPLEX RIGHT.    Vascular Sonographer Report   Additional Indications:Swelling  Impressions Right Impression Acute totally occluding deep vein thrombosis involving the right proximal PTV and one gastroc vein zone 5-6. Acute partially occluding deep vein thrombosis involving the popliteal vein and right one gastroc vein zone 5-6. No other evidence of deep vein or superficial vein thrombosis involving the right lower extremity and the left common femoral vein. Calf veins were not well visualized  on the right due to patient positioning and edema.  Verbal to [employer].  Conclusions    Summary    Acute totally occluding deep vein thrombosis involving the right proximal  PTV and one gastroc vein zone 5-6.  Acute partially occluding deep vein thrombosis involving the popliteal vein  and right one gastroc vein zone 5-6.  Calf veins were not well visualized on the right due to patient positioning  and edema.    Signature    ------------------------------------------------------------------  Electronically signed by Peggyann Shoals, MD, [facility], [facility]  (Interpreting physician) on 07/10/2014 at 11:34 AM  ------------------------------------------------------------------   Patient Status:Routine. [place] - Vascular Lab. Technical Quality:Poor visualization.  Risk Factors History +------------------+----------+----------------------------------------------+ !Diagnosis         !Date      !Comments                                      ! +------------------+----------+----------------------------------------------+ !Previous Procedure!06/27/2014!Venous Doppler Bilateral: Acute DVT right     ! !                  !          !gastroc veins.                                ! +------------------+----------+----------------------------------------------+  Velocities are measured in cm/s ; Diameters are measured in mm  Right Lower Extremities DVT Study Measurements Right 2D Measurements +------------------------+----------+---------------+----------+ !Location                !Visualized!Compressibility!Thrombosis! +------------------------+----------+---------------+----------+ !Sapheno Femoral Junction!Yes       !Yes            !None      ! +------------------------+----------+---------------+----------+ !GSV Thigh               !Yes       !Yes            !None      ! +------------------------+----------+---------------+----------+ !  Common Femoral          !Yes       !Yes            !None      !  +------------------------+----------+---------------+----------+ !Prox Femoral            !Yes       !Yes            !None      ! +------------------------+----------+---------------+----------+ !Mid Femoral             !Yes       !Yes            !None      ! +------------------------+----------+---------------+----------+ !Dist Femoral            !Yes       !Yes            !None      ! +------------------------+----------+---------------+----------+ !Deep Femoral            !Yes       !Yes            !None      ! +------------------------+----------+---------------+----------+ !Popliteal               !Yes       !Partial        !Acute     ! +------------------------+----------+---------------+----------+ !GSV Below Knee          !Yes       !Yes            !None      ! +------------------------+----------+---------------+----------+ !Gastroc                 !Yes       !Partial        !Acute     ! +------------------------+----------+---------------+----------+ !PTV                     !Yes       !No             !Acute     ! +------------------------+----------+---------------+----------+ !Peroneal                !Yes       !Yes            !None      ! +------------------------+----------+---------------+----------+ !SSV                     !Yes       !Yes            !None      ! +------------------------+----------+---------------+----------+  Right Doppler Measurements +--------------+------+------+------------+ !Location      !Signal!Reflux!Reflux (sec)! +--------------+------+------+------------+ !Common Femoral!Phasic!      !            ! +--------------+------+------+------------+ !Femoral       !Phasic!      !            ! +--------------+------+------+------------+ !Deep Femoral  !Phasic!      !            ! +--------------+------+------+------------+ !Popliteal     !Phasic!      !            ! +--------------+------+------+------------+  Left Lower Extremities DVT Study Measurements Left 2D Measurements  +------------------------+----------+---------------+----------+ !Location                !Visualized!Compressibility!Thrombosis! +------------------------+----------+---------------+----------+ !Sapheno Femoral Junction!Yes       !  Yes            !None      ! +------------------------+----------+---------------+----------+ !Common Femoral          !Yes       !Yes            !None      ! +------------------------+----------+---------------+----------+  Left Doppler Measurements +--------------+------+------+------------+ !Location      !Signal!Reflux!Reflux (sec)! +--------------+------+------+------------+ !Common Femoral!Phasic!      !            ! +--------------+------+------+------------+    Cta Pulmonary With Contrast    06/27/2014   EXAMINATION: CTA OF THE CHEST 06/27/2014 2:53 pm  TECHNIQUE: CTA of the chest was performed after the administration of intravenous contrast.  Multiplanar reformatted images are provided for review.  MIP images are provided for review.  Patient received 100 mL Isovue 370 intravenous contrast.  COMPARISON: CT chest with contrast June 13, 2014.  HISTORY: ORDERING SYSTEM PROVIDED HISTORY: DVT (deep venous thrombosis), unspecified laterality [facility]) TECHNOLOGIST PROVIDED HISTORY: Ordering Physician Provided Reason for Exam: DVT, SOB Injury/Trauma or Illness: Illness/Other Acuity: Acute Type of Exam: Initial Relevant Medica/Surgical History: CHF, cardiomyopathy  FINDINGS: Pulmonary Arteries: Pulmonary arteries are adequately opacified for evaluation.  No evidence of intraluminal filling defect to suggest pulmonary embolism.  Main pulmonary artery is normal in caliber.  Mediastinum: No evidence of mediastinal lymphadenopathy.  Heart is enlarged, similar in appearance.  The heart and pericardium demonstrate no acute abnormality.  There is no acute abnormality of the thoracic aorta. Nodularity of thyroid similar in appearance.  Lungs/pleura: The central airways are patent.  There is a moderate  right-sided pleural effusion which appears slightly decreased compared to prior study.  There is mild patchy dependent opacities in the right upper lobe, similar in appearance.  There are dependent subpleural opacities within the lingula and left lower lobe.  There is mild opacity within the right middle lobe which is linear in appearance in appears slightly improved compared to prior study.  2 nodular appearing densities are again seen in the right lower lobe on image number 65, unchanged.  No pneumothorax.  Upper Abdomen: Cirrhotic liver with mildly distended gallbladder is similar in appearance to prior study.  Soft Tissues/Bones: Multiple compression fractures again seen, most pronounced at the thoracolumbar junction, similar in appearance.  Old right-sided rib fractures are also seen.  No acute bone or soft tissue abnormality.  Gynecomastia is again seen.     06/27/2014   IMPRESSION: No evidence of pulmonary embolism.  Moderate right pleural effusion, minimally decreased.  Persistent patchy areas of airspace opacity which may be related to atelectasis versus pneumonia, slightly improved.  2 nodular appearing densities are seen within the right lower lobe which may be related to the airspace disease or represent lung nodules.  Follow-up recommended with dedicated chest CT in 3 months.  Remote compression fractures again seen.  Cirrhotic liver.     Vl Extremity Venous Bilateral    07/09/2014   Vascular Lower Extremities DVT Study Procedure  -- PRELIMINARY SONOGRAPHER REPORT --    Demographics    Patient Name      JACARRI GESNER    Date of Study     07/09/2014          Gender              Male    Patient Number    [id number]          Date of Birth  Sep 16, 1940    Visit Number      [id number]         Age                 43 year(s)    Accession Number  [id number]           Room Number         0350    Corporate ID      [id number]            Sonographer         Erasmo Leventhal,                                                             RVS    Ordering          Deniece Portela,   Interpreting        [facility]  Physician         MD                  Physician   Procedure  Type of Study:    Veins:Lower Extremities DVT Study, VL EXTREMITY VENOUS DUPLEX RIGHT.   Tech Comments Right Acute totally occluding deep vein thrombosis involving the right proximal PTV and one gastroc vein zone 5-6. Acute partially occluding deep vein thrombosis involving the popliteal vein and right one gastroc vein zone 5-6. No other evidence of deep vein or superficial vein thrombosis involving the right lower extremity and the left common femoral vein. Calf veins were not well visualized on the right due to patient positioning and edema.  verbal to [employer].    Vl Extremity Venous Bilateral    06/27/2014   Lower Extremities DVT Study   Demographics    Patient Name       ANGELICA WIX    Date of Study      06/27/2014        Gender              Male    Patient Number     [id number]        Date of Birth       28-Apr-1940    Visit Number       [id number]       Age                 67 year(s)    Accession Number   [id number]         Room Number    Corporate ID       [id number]          Sonographer         Erasmo Leventhal,                                                           [place]    Ordering Physician Lacretia Leigh, MD  Interpreting        [facility]  Physician           Darlyn Chamber MD,                                                           [facility]   Procedure  Type of Study:    Veins:Lower Extremities DVT Study, VASC EXTREMITY VENOUS DUPLEX BILATERAL.    Vascular Sonographer Report   Additional Indications:Swelliing  Impressions Right Impression Acute partially occluding deep vein thrombosis involving the right set of gastroc veins zone 5-6. No other evidence of deep vein or superficial vein thrombosis involving the right lower extremity. Reflux noted in the right FV. Left Impression No evidence of deep vein or  superficial vein thrombosis involving the left lower extremity.  Verbal to Dr. Jeb Levering. Patient released.  Conclusions    Summary    -Acute partially occluding deep vein thrombosis involving the right set of  gastroc veins zone 5-6.  -Reflux noted in the right FV.    Signature    ------------------------------------------------------------------  Electronically signed by Darlyn Chamber MD, [facility] (Interpreting  physician) on 06/27/2014 at 01:38 PM  ------------------------------------------------------------------   Patient Status:Routine. [place] - Vascular Lab. Technical Quality:Adequate visualization.  Velocities are measured in cm/s ; Diameters are measured in mm  Right Lower Extremities DVT Study Measurements Right 2D Measurements +------------------------+----------+---------------+----------+ !Location                !Visualized!Compressibility!Thrombosis! +------------------------+----------+---------------+----------+ !Sapheno Femoral Junction!Yes       !Yes            !None      ! +------------------------+----------+---------------+----------+ !GSV Thigh               !Yes       !Yes            !None      ! +------------------------+----------+---------------+----------+ !Common Femoral          !Yes       !Yes            !None      ! +------------------------+----------+---------------+----------+ !Prox Femoral            !Yes       !Yes            !None      ! +------------------------+----------+---------------+----------+ !Mid Femoral             !Yes       !Yes            !None      ! +------------------------+----------+---------------+----------+ !Dist Femoral            !Yes       !Yes            !None      ! +------------------------+----------+---------------+----------+ !Deep Femoral            !Yes       !Yes            !None      ! +------------------------+----------+---------------+----------+ !Popliteal               !Yes       !Yes            !None      !  +------------------------+----------+---------------+----------+ !GSV Below Knee          !  Yes       !Yes            !None      ! +------------------------+----------+---------------+----------+ !Gastroc                 !Yes       !Partial        !Acute     ! +------------------------+----------+---------------+----------+ !PTV                     !Yes       !Yes            !None      ! +------------------------+----------+---------------+----------+ !Peroneal                !Yes       !Yes            !None      ! +------------------------+----------+---------------+----------+ !SSV                     !Yes       !Yes            !None      ! +------------------------+----------+---------------+----------+  Right Doppler Measurements +------------------------+------+------+------------+ !Location                !Signal!Reflux!Reflux (sec)! +------------------------+------+------+------------+ !Sapheno Femoral Junction!Phasic!No    !            ! +------------------------+------+------+------------+ !Common Femoral          !Phasic!No    !            ! +------------------------+------+------+------------+ !Femoral                 !Phasic!Yes   !            ! +------------------------+------+------+------------+ !Deep Femoral            !Phasic!No    !            ! +------------------------+------+------+------------+ !Popliteal               !Phasic!No    !            ! +------------------------+------+------+------------+  Left Lower Extremities DVT Study Measurements Left 2D Measurements +------------------------+----------+---------------+----------+ !Location                !Visualized!Compressibility!Thrombosis! +------------------------+----------+---------------+----------+ !Sapheno Femoral Junction!Yes       !Yes            !None      ! +------------------------+----------+---------------+----------+ !GSV Thigh               !Yes       !Yes            !None      !  +------------------------+----------+---------------+----------+ !Common Femoral          !Yes       !Yes            !None      ! +------------------------+----------+---------------+----------+ !Prox Femoral            !Yes       !Yes            !None      ! +------------------------+----------+---------------+----------+ !Mid Femoral             !Yes       !Yes            !None      ! +------------------------+----------+---------------+----------+ !Dist Femoral            !  Yes       !Yes            !None      ! +------------------------+----------+---------------+----------+ !Deep Femoral            !Yes       !Yes            !None      ! +------------------------+----------+---------------+----------+ !Popliteal               !Yes       !Yes            !None      ! +------------------------+----------+---------------+----------+ !GSV Below Knee          !Yes       !Yes            !None      ! +------------------------+----------+---------------+----------+ !Gastroc                 !Yes       !Yes            !None      ! +------------------------+----------+---------------+----------+ !PTV                     !Yes       !Yes            !None      ! +------------------------+----------+---------------+----------+ !Peroneal                !Yes       !Yes            !None      ! +------------------------+----------+---------------+----------+ !SSV                     !Yes       !Yes            !None      ! +------------------------+----------+---------------+----------+  Left Doppler Measurements +------------------------+------+------+------------+ !Location                !Signal!Reflux!Reflux (sec)! +------------------------+------+------+------------+ !Sapheno Femoral Junction!Phasic!No    !            ! +------------------------+------+------+------------+ !Common Femoral          !Phasic!No    !            ! +------------------------+------+------+------------+ !Femoral                 !Phasic!No    !             ! +------------------------+------+------+------------+ !Deep Femoral            !Phasic!No    !            ! +------------------------+------+------+------------+ !Popliteal               !Phasic!No    !            ! +------------------------+------+------+------------+      Assessment and Plan:  Patient Active Hospital Problem List:   Hepatic encephalopathy ([place]) (07/07/2014)    Assessment: doing ok    Plan: Continue present orders/plan   Cirrhosis, nonalcoholic ([place]) ()    Assessment: Stable    Plan: Continue present orders/plan   Hyperlipidemia ()    Assessment: Stable    Plan: Continue present orders/plan   Benign essential HTN (07/03/2014)    Assessment: Stable    Plan: Continue present orders/plan   Pancytopenia ([place]) (07/07/2014)    Assessment: counts still  low    Plan: tx per Dr Tobie Lords   DVT (deep venous thrombosis) ([place]) (07/10/2014)    Assessment: needs Lovenox for 2 wk total (until 5/16)    Plan: Continue present orders/plan              Deniece Portela  07/15/2014

## 2014-07-15 NOTE — Telephone Encounter (Signed)
Noted cboain

## 2014-07-16 NOTE — Progress Notes (Signed)
Occupational Therapy  Occupational Therapy Discharge Summary    Name: Andrew [name]  DOB: 1940-07-30    The pt was evaluated by OT on 5/2 and seen for 4 treatment sessions prior to DC to home on5/9 per MD order.   The pt's acute therapy goals were:  Short term goals  Time Frame for Short term goals: Discharge  Short term goal 1: Pt will be supervision for all UB ADLs.- supervision and cues to sequence grooming task  Short term goal 2: Pt will be supervision for all LB ADLs-- SBA  Short term goal 3: Pt will be SBA for functional mobility with AD as needed-- CGA    Short term goal 4: Pt will be supervision for functional transfers to/from all ADL surfaces-- CGA   Long term goals  Time Frame for Long term goals : STG=LTG     Patient met 0 goals during stay.  Number of Refusals:0  Number of Holds: 0  During this hospitalization, the patient was educated on:  Patient Education: Role of OT, OT evaluation, safe functional mobility, POC, d/c recommendations    DC pt from OT caseload at this time.  Thank you, Virl Son,

## 2014-07-16 NOTE — Telephone Encounter (Signed)
Spoke with [facility] nurse- pt is asymptomatic with BP. Initially they went to the house for Lovenox injections but the family was doing well giving them. Will not admit pt to [facility] service, just wanted to check on the meds. Says he wants to eventually get back to cardiac rehab.

## 2014-07-16 NOTE — Telephone Encounter (Signed)
[facility] seeing patient today and wants LEW to know his b/p is 98/63. Wife said while in hospital they held his b/p med sometimes . He is on Coreg 3.125mg  QAM and Coreg 6.25mg  QPM, Lasix 40mg  QAM and QPM and Eplerenone 50mg  QD . Please call HHN to advise

## 2014-07-16 NOTE — Telephone Encounter (Signed)
No, I do not want to hold any of his "blood pressure" meds, because these are not being given for blood pressure, but for his heart.  If BP running low, he can cut down on his lasix to 2 a day instead of 3 a day on those days when BP low.  LEW

## 2014-07-16 NOTE — Progress Notes (Signed)
Physical Therapy Discharge Summary    Name: Andrew [name]  DOB: May 13, 1940    The pt was evaluated by PT on 07/08/14 and seen for two treatment sessions prior to DC to home on 5/9 per MD order.   The pt's acute therapy goals were:  Short term goals  Time Frame for Short term goals: prior to discharge  Short term goal 1: pt will be SUPV for bed mobility  Short term goal 2: pt will be SBA for transfers- goal met 5/9  Short term goal 3: pt will be able to ambulate 20' with or without AAD with CGA - goal met 5/9  Short term goal 4: pt will be able to ascend/descend 4 steps with HR with CGA   Long term goals  Time Frame for Long term goals : LTG=STG  Long term goal 1: pt will be SUPV for transfers  Long term goal 2: pt will be able to ambulate 150' with SUPV     Patient met 2 goals during stay.  Number of Refusals:0  Number of Holds: 2  During this hospitalization, the patient was educated on:  Patient Education: role of PT and POC, discharge recommendations, safety with transfers    DC pt from PT caseload at this time.  Thank you, Kevan Ny, PT DPT [phone]

## 2014-07-19 ENCOUNTER — Ambulatory Visit
Admit: 2014-07-19 | Discharge: 2014-07-19 | Payer: MEDICARE | Attending: Hematology & Oncology | Primary: Internal Medicine

## 2014-07-19 ENCOUNTER — Telehealth

## 2014-07-19 ENCOUNTER — Encounter: Attending: Hematology & Oncology | Primary: Internal Medicine

## 2014-07-19 DIAGNOSIS — D61818 Other pancytopenia: Secondary | ICD-10-CM

## 2014-07-19 LAB — CBC WITH AUTO DIFFERENTIAL
Granulocytes %: 29.6 % — ABNORMAL LOW (ref 37.0–80.0)
Granulocytes: 0.8 10*3/uL — ABNORMAL LOW (ref 2.0–7.8)
Hematocrit: 26.6 % — ABNORMAL LOW (ref 43.5–53.7)
Hemoglobin: 8.5 g/dL — ABNORMAL LOW (ref 14.1–18.1)
Lymphocyte %: 57 % — ABNORMAL HIGH (ref 10.0–50.0)
Lymphocytes: 1.6 10*3/uL (ref 0.6–4.1)
MCH: 34.8 pg — ABNORMAL HIGH (ref 27.0–31.2)
MCHC: 32 g/dL (ref 31.8–35.4)
MCV: 108.9 fL — ABNORMAL HIGH (ref 80.0–97.0)
Mid Cells %: 13.4 % (ref 0.1–24.0)
Mid Cells: 0.4 10*3/uL (ref 0.0–1.8)
Platelets: 55 10*3/uL — ABNORMAL LOW (ref 142–424)
RBC: 2.44 M/uL — ABNORMAL LOW (ref 4.69–6.13)
RDW: 23.7 % — ABNORMAL HIGH (ref 11.6–14.8)
WBC: 2.8 10*3/uL — ABNORMAL LOW (ref 4.6–10.2)

## 2014-07-19 MED ORDER — ENOXAPARIN SODIUM 80 MG/0.8ML SC SOLN
80 MG/0.8ML | INJECTION | Freq: Two times a day (BID) | SUBCUTANEOUS | Status: AC
Start: 2014-07-19 — End: 2014-08-02

## 2014-07-19 NOTE — Telephone Encounter (Signed)
Returned phone call to [place] with [employer].  Instructed Andrew [name] that the quantity needs to be 28 syringes instead of 1.  Andrew [name] verbalized understanding.

## 2014-07-19 NOTE — Telephone Encounter (Signed)
Patient called the CHF resource line today with concerns of 5 lbs weight loss in 2 days.  He has been having diarrhea constantly for past 2 days (hourly).  She has stopped the lactulose and has a call out to GI/ liver MD.  His weight this am at home was 169 lbs.   Recommended to hold the Lasix completely until diarrhea resolves and to call us with an update next Monday.  Voiced understanding.   Princess Bruins, NP

## 2014-07-19 NOTE — Telephone Encounter (Addendum)
The patient's wife ([place]) Stopped by at the office. She stated that the pt has an appt for next Friday the 18th. He was recently d/c from the hospital.  And he was suppose to get a PFT and CT Scan and has done either yet since he has been sick. The wife would like a call back in regards to this.    Please advise

## 2014-07-19 NOTE — Progress Notes (Signed)
[facility] FOLLOW-UP:     Primary Oncologist: Dr. Eulis Canner  Date: 07/19/2014    PCP: Alecia Lemming, DO  Referring Provider: Alecia Lemming, DO   GI:  Rodney Booze, MD    PROBLEM LIST:     Patient Active Problem List   Diagnosis   ??? Erectile dysfunction   ??? Thrombocytopenia   ??? Edema   ??? BPH (benign prostatic hyperplasia)   ??? Alpha-1-antitrypsin deficiency   ??? Cirrhosis, nonalcoholic ([place])   ??? Mild CAD   ??? Chronic systolic CHF (congestive heart failure) ([place])   ??? Non-ischemic cardiomyopathy ([place])   ??? Lumbar compression fracture ([place])   ??? Chronic low back pain   ??? Osteopenia   ??? Coagulopathy ([place])   ??? Abnormal EKG   ??? Visual changes   ??? Fixed pupil of right eye   ??? Anemia   ??? Hemispheric carotid artery syndrome   ??? CAD in native artery   ??? Hyperlipidemia   ??? 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 Bronchiectasis without complication ([place])   ??? Benign essential HTN   ??? Hepatic encephalopathy ([facility])   ??? Ataxia   ??? Pancytopenia ([place])   ??? DVT (deep venous thrombosis) ([place])   ??? 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   2.  Cirrhosis.   3.  Alpha-1 antitrypsin deficiency.  4.  Hepatic encephalopathy.  5.  Right leg DVT.  6.  Idiopathic cardiomyopathy.     Current therapy:  Lovenox 80 mg bid.  Lactulose.  Procrit.     INTERVAL HISTORY     [name] is a 74 y.o. male that is being seen in the office today for follow up.  He was released from [facility] this Monday.  He feels better.  His lactulose has been held because of frequent diarrhea.     REVIEW OF SYSTEMS:     CONSTITUTIONAL: Denies fever, sweats, weight loss.  Fatigue.   EYES: No visual changes or diplopia. No scleral icterus.  ENT: No Headaches, hearing loss or vertigo. No mouth sores or sore throat.  CARDIOVASCULAR: No chest pain, dyspnea on exertion, palpitations or loss of consciousness.   RESPIRATORY: No cough or wheezing, no sputum  production. No hemoptysis.    GASTROINTESTINAL: No abdominal pain, appetite loss, blood in stools. No change in bowel habits.  GENITOURINARY: No dysuria, trouble voiding, or hematuria.  MUSCULOSKELETAL: no generalized weakness. No joint complaints.  INTEGUMENTARY: No rash or pruritus.  NEUROLOGICAL: No headache, diplopia. No change in gait, balance, or coordination. No paresthesia.  ENDOCRINE: No temperature intolerance. No excessive thirst, fluid intake, or urination.   HEMATOLOGIC/LYMPHATIC: No abnormal bruising or ecchymosis, blood clots or swollen lymph nodes.  ALLERGIC/IMMUNOLOGIC: No nasal congestion or hives.     PHYSICAL EXAM:     BP 102/58 mmHg   Pulse 64   Temp(Src) 98.2 ??F (36.8 ??C) (Oral)   Resp 16   Ht $R'5\' 5"'KL$  (1.651 m)   Wt 173 lb 6.4 oz (78.654 kg)   BMI 28.86 kg/m2    WEIGHT:   Wt Readings from Last 3 Encounters:   07/19/14 173 lb 6.4 oz (78.654 kg)   07/15/14 175 lb 11.2 oz (79.697 kg)   07/03/14 180 lb (81.647 kg)     GENERAL APPEARANCE: alert and cooperative.  Looks better but somewhat pale.  HEAD: Normocephalic, without obvious  abnormality, atraumatic  NECK: No palpable lymphadenopathy in supraclavicular or cervical chains  LUNGS: Clear to auscultation bilaterally, no audible rales, wheezes or crackles  HEART: Regular rate and rhythm, S1, S2 normal  ABDOMEN: Soft, non-tender; slightly distended. His bowel sounds normal; no masses, no organomegaly  EXTREMITIES: without cyanosis, clubbing, or asymmetry. Mild leg edema.  SKIN: No jaundice, purpura or petechiae.  A few ecchymosis.     LABS:     CBC:  Lab Results   Component Value Date    WBC 2.8* 07/19/2014    HGB 8.5* 07/19/2014    HCT 26.6* 07/19/2014    MCV 108.9* 07/19/2014    PLT 55* 07/19/2014    LABLYMP 1.6 07/19/2014    MID 0.4 07/19/2014    GRAN 0.8* 07/19/2014    LYMPHOPCT 57.0* 07/19/2014    MIDPERCENT 13.4 07/19/2014    GRANULOCYTES 29.6* 07/19/2014    RBC 2.44* 07/19/2014    MCH 34.8* 07/19/2014    MCHC 32.0 07/19/2014    RDW 23.7*  07/19/2014       Lab Results   Component Value Date    NA 139 07/14/2014    K 3.9 07/14/2014    CL 105 07/14/2014    CO2 29 07/14/2014    BUN 13 07/14/2014    CREATININE 0.7* 07/14/2014    GLUCOSE 95 07/14/2014    CALCIUM 8.2* 07/14/2014    PROT 5.5* 07/09/2014    LABALBU 1.9* 07/09/2014    BILITOT 1.1* 07/09/2014    ALKPHOS 129 07/09/2014    AST 35 07/09/2014    ALT 20 07/09/2014    LABGLOM >60 07/14/2014    GFRAA >60 07/14/2014    AGRATIO 0.6* 05/03/2014    GLOB 3.9 05/03/2014       No results found for: PTINR    TUMOR MARKERS:    Lab Results   Component Value Date    PSA 0.55 08/15/2013         IMAGING:     Ct Head Wo Contrast    07/07/2014   EXAMINATION: CT OF THE HEAD WITHOUT CONTRAST  07/07/2014 11:11 pm  TECHNIQUE: CT of the head was performed without the administration of intravenous contrast.  COMPARISON: 04/01/2014  HISTORY: ORDERING SYSTEM PROVIDED HISTORY: dizzy TECHNOLOGIST PROVIDED HISTORY: Ordering Physician Provided Reason for Exam: dizzy  FINDINGS: BRAIN/VENTRICLES: There is no acute intracranial hemorrhage, mass effect or midline shift.  No abnormal extra-axial fluid collection.  The gray-white differentiation is maintained without evidence of an acute infarct.  There is no evidence of hydrocephalus.  ORBITS: The visualized portion of the orbits demonstrate no acute abnormality.  SINUSES: There is minimal mucosal thickening in the left maxillary sinus. The remaining visualized paranasal sinuses are clear.  The mastoid air cells are unremarkable.  SOFT TISSUES/SKULL:  No acute abnormality of the visualized skull or soft tissues.     07/07/2014   IMPRESSION: No acute intracranial abnormality.     US Abdomen Complete    07/08/2014   EXAMINATION: COMPLETE ABDOMINAL ULTRASOUND 07/08/2014 6:04 pm  COMPARISON: None  HISTORY: ORDERING SYSTEM PROVIDED HISTORY: CIRRHOSIS TECHNOLOGIST PROVIDED HISTORY: Ordering Physician Provided Reason for Exam: cirrhosis, r/o ascites Acuity: Acute Type of Exam: Initial  FINDINGS:  Study is limited by body habitus.  LIVER: Evaluation of the liver is limited.  There is diffuse increased echogenicity with no focal lesion noted.  Somewhat nodular contour of the liver is compatible with known cirrhosis.  BILIARY SYSTEM: Limited imaging of the gallbladder is grossly unremarkable.  Common bile duct is within normal limits measuring 6 mm.  KIDNEYS: Right kidney is nonvisualized.  Left kidney measures 10.1 x 4.9 x 5.3 cm and is grossly unremarkable.  PANCREAS: Pancreas is nonvisualized.  SPLEEN: Spleen measures 10.1 cm.  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  IVC: The IVC is patent.  AORTA: Aorta is obscured.  OTHER: No evidence of ascites.     07/08/2014   IMPRESSION: Limited study as above.  Cirrhosis and portal hypertension.  No evidence of ascites.     Cta Pulmonary With Contrast    06/27/2014   EXAMINATION: CTA OF THE CHEST 06/27/2014 2:53 pm  TECHNIQUE: CTA of the chest was performed after the administration of intravenous contrast.  Multiplanar reformatted images are provided for review.  MIP images are provided for review.  Patient received 100 mL Isovue 370 intravenous contrast.  COMPARISON: CT chest with contrast June 13, 2014.  HISTORY: ORDERING SYSTEM PROVIDED HISTORY: DVT (deep venous thrombosis), unspecified laterality [facility]) TECHNOLOGIST PROVIDED HISTORY: Ordering Physician Provided Reason for Exam: DVT, SOB Injury/Trauma or Illness: Illness/Other Acuity: Acute Type of Exam: Initial Relevant Medica/Surgical History: CHF, cardiomyopathy  FINDINGS: Pulmonary Arteries: Pulmonary arteries are adequately opacified for evaluation.  No evidence of intraluminal filling defect to suggest pulmonary embolism.  Main pulmonary artery is normal in caliber.  Mediastinum: No evidence of mediastinal lymphadenopathy.  Heart is enlarged, similar in appearance.  The heart and pericardium demonstrate no acute abnormality.  There is no acute abnormality of the thoracic aorta. Nodularity of thyroid similar in appearance.   Lungs/pleura: The central airways are patent.  There is a moderate right-sided pleural effusion which appears slightly decreased compared to prior study.  There is mild patchy dependent opacities in the right upper lobe, similar in appearance.  There are dependent subpleural opacities within the lingula and left lower lobe.  There is mild opacity within the right middle lobe which is linear in appearance in appears slightly improved compared to prior study.  2 nodular appearing densities are again seen in the right lower lobe on image number 65, unchanged.  No pneumothorax.  Upper Abdomen: Cirrhotic liver with mildly distended gallbladder is similar in appearance to prior study.  Soft Tissues/Bones: Multiple compression fractures again seen, most pronounced at the thoracolumbar junction, similar in appearance.  Old right-sided rib fractures are also seen.  No acute bone or soft tissue abnormality.  Gynecomastia is again seen.     06/27/2014   IMPRESSION: No evidence of pulmonary embolism.  Moderate right pleural effusion, minimally decreased.  Persistent patchy areas of airspace opacity which may be related to atelectasis versus pneumonia, slightly improved.  2 nodular appearing densities are seen within the right lower lobe which may be related to the airspace disease or represent lung nodules.  Follow-up recommended with dedicated chest CT in 3 months.  Remote compression fractures again seen.  Cirrhotic liver.     Vl Extremity Venous Bilateral    07/09/2014   Vascular Lower Extremities DVT Study Procedure  -- PRELIMINARY SONOGRAPHER REPORT --    Demographics    Patient Name      CADAN MAGGART    Date of Study     07/09/2014          Gender              Male    Patient Number    [id number]          Date of Birth       [birth year]  Visit Number      [id number]         Age                 61 year(s)    Accession Number  [id number]           Room Number         5427    Corporate ID      [id number]            Sonographer          Erasmo Leventhal,                                                            RVS    Ordering          Deniece Portela,   Interpreting        [facility] Vascular  Physician         MD                  Physician   Procedure  Type of Study:    Veins:Lower Extremities DVT Study, VL EXTREMITY VENOUS DUPLEX RIGHT.   Tech Comments Right Acute totally occluding deep vein thrombosis involving the right proximal PTV and one gastroc vein zone 5-6. Acute partially occluding deep vein thrombosis involving the popliteal vein and right one gastroc vein zone 5-6. No other evidence of deep vein or superficial vein thrombosis involving the right lower extremity and the left common femoral vein. Calf veins were not well visualized on the right due to patient positioning and edema.  verbal to [employer].    Vl Extremity Venous Bilateral    06/27/2014   Lower Extremities DVT Study   Demographics    Patient Name       HOKE BAER    Date of Study      06/27/2014        Gender              Male    Patient Number     [id number]        Date of Birth       1940/08/22    Visit Number       [id number]       Age                 61 year(s)    Accession Number   [id number]         Room Number    Corporate ID       [id number]          Sonographer         Erasmo Leventhal,    RVS    Ordering Physician Lacretia Leigh, MD  Interpreting        [facility] Vascular   Physician           Darlyn Chamber MD,    [facility]   Procedure  Type of Study:    Veins:Lower Extremities DVT Study, VASC EXTREMITY VENOUS DUPLEX BILATERAL.    Vascular Sonographer Report   Additional Indications:Swelliing  Impressions Right Impression Acute partially occluding deep vein thrombosis involving the right set of gastroc veins zone 5-6. No other evidence of deep vein or superficial vein thrombosis involving the right lower extremity.  Reflux noted in the right FV. Left Impression No evidence of deep vein or superficial vein thrombosis involving the left lower extremity.  Verbal to Dr. Jeb Levering.  Patient released.  Conclusions    Summary    -Acute partially occluding deep vein thrombosis involving the right set of  gastroc veins zone 5-6.  -Reflux noted in the right FV.    Signature  Electronically signed by Darlyn Chamber MD, [facility] (Interpreting  physician) on 06/27/2014 at 01:38 PM    Patient Status:Routine. [place] - Vascular Lab. Technical Quality:Adequate visualization.  Velocities are measured in cm/s ; Diameters are measured in mm  Right Lower Extremities DVT Study Measurements Right 2D Measurements       STAGING:     No matching staging information was found for the patient.    ASSESSMENT AND PLAN:     1.  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   2.  Cirrhosis.   3.  Alpha-1 antitrypsin deficiency.  4.  Hepatic encephalopathy.  5.  Right leg DVT.  6.  Idiopathic cardiomyopathy.     Long discussion with the patient and his wife about his conditions.  His counts are stable and he has no sign of bleeding at present.  They understand the risk of bleeding associated with anticoagulation and thrombocytopenia.  I still suspect his pancytopenia is partially due to cirrhosis and nutritional factor.  We discussed about the management for his MDS.  We will continue Lovenox for now.  He will return in a week for CBC and procrit and we like to start him on hypomethylating agent Vidaza on 5/23.  This should be well tolerated.  Side effects and toxicities including myelosuppression were explained.      Eulis Canner, M.D.; M.S.  Medical Oncology/Hematology  Phone: [phone]  Fax: [phone]    [facility]  [address] [place] [address] [place] [address]    [place] [address]    [place] [address]

## 2014-07-19 NOTE — Telephone Encounter (Signed)
Daleen Snook called requesting clarification of the quantity for the patient's lovenox prescription.  Please call back.

## 2014-07-19 NOTE — Telephone Encounter (Signed)
lmz on vm that I would ask Dr Jeb Levering if he wants the CT Chest sooner or later than Friday 18th - Dr Jeb Levering the pt was in the hospital since he was seen in the office and was d/c on 07/16/14 - pt was told to have his PFT and CT Chest - pt's appt is on Friday 18th and was unable to have his test done - did you want to see him later in the month to given the pt time to have both test done?  Please advise

## 2014-07-20 NOTE — Telephone Encounter (Signed)
OK to get CT as planned

## 2014-07-22 NOTE — Telephone Encounter (Signed)
I spoke with pt's wife and rescheduled the pt to see Dr Jeb Levering after he has his CT Chest / PFT

## 2014-07-23 ENCOUNTER — Encounter: Primary: Internal Medicine

## 2014-07-23 NOTE — Discharge Summary (Signed)
[facility] -- Physician Discharge Summary     Andrew [name]  1940-04-24  MRN: [id number]    Admit Date: 07/08/2014  Discharge Date: 07/15/2014  5:44 PM    Attending MD: Deniece Portela, MD  Discharging MD: Deniece Portela, MD  PCP: Alecia Lemming, DO [place] [address] [place] [address] [phone]    Admission Diagnosis: DIZZY    Full Hospital Problem List:  Active Hospital Problems    Diagnosis Date Noted   ??? DVT (deep venous thrombosis) ([place]) [I82.409] 07/10/2014   ??? Hepatic encephalopathy ([place]) [K72.90] 07/07/2014   ??? Ataxia [R27.0] 07/07/2014   ??? Pancytopenia ([place]) [D61.818] 07/07/2014   ??? Benign essential HTN [I10] 07/03/2014   ??? Hyperlipidemia [E78.5]    ??? 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 His wife states that he has gotten progressively weaker, and that now he cannot stand without assistance. This happened about four years ago when he was in [facility], and he was diagnosed with cirrhosis. At that time he had a high ammonia level. He is seen by Dr. Buena Irish, cardiologist for CHF. Also, he has a DVT in his leg, but because of his liver disease his doctors recommended against anticoagulants. He has not complained of chest pain, SOB, or abdominal pain. His coordination has been poor, and he can't put his glasses on or hold a cup or fork in either hand without spilling. He does not have a tremor. He has fallen several times today without injury, due to inability to stand without assistance. He denies a spinning or rotational sensation.     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 Further workup to be done as outpt  Pt does receive a few transfusions while here to bring his counts up    PT/OT recommend Inpt rehab - but pt's insurance does not approve this  Family elects for home discharge once IP rehab is denied        Consults made during Hospitalization:  IP CONSULT TO INTERNAL MEDICINE  IP CONSULT TO HEM/ONC  IP CONSULT TO GI  IP CONSULT TO [place]    Treatment team at time of Discharge: Treatment Team: Attending Provider: Deniece Portela, MD; Consulting Physician: Deniece Portela, MD; Consulting Physician: Paulino Rily, MD; Consulting Physician: Nance Pew, MD; Registered Nurse: Danne Baxter, RN; Registered Nurse: Dede Query, RN; Respiratory Therapist (Day): Namon Cirri, RCP    Imaging Results:  Ct Head Wo Contrast    07/07/2014   EXAMINATION: CT OF THE HEAD WITHOUT CONTRAST  07/07/2014 11:11 pm  TECHNIQUE: CT of the head was performed without the administration of intravenous contrast.  COMPARISON: 04/01/2014  HISTORY: ORDERING SYSTEM PROVIDED HISTORY: dizzy TECHNOLOGIST PROVIDED HISTORY:  Ordering Physician Provided Reason for Exam: dizzy  FINDINGS: BRAIN/VENTRICLES: There is no acute intracranial hemorrhage, mass effect or midline shift.  No abnormal extra-axial fluid collection.  The gray-white differentiation is maintained without evidence of an acute infarct.  There is no evidence of hydrocephalus.  ORBITS: The visualized portion of the orbits demonstrate no acute abnormality.  SINUSES: There is minimal mucosal thickening in the left maxillary sinus. The remaining visualized paranasal sinuses are clear.  The mastoid air cells are unremarkable.  SOFT TISSUES/SKULL:  No acute abnormality of the visualized skull or soft tissues.     07/07/2014   IMPRESSION: No acute  intracranial abnormality.     US Abdomen Complete    07/08/2014   EXAMINATION: COMPLETE ABDOMINAL ULTRASOUND 07/08/2014 6:04 pm  COMPARISON: None  HISTORY: ORDERING SYSTEM PROVIDED HISTORY: CIRRHOSIS TECHNOLOGIST PROVIDED HISTORY: Ordering Physician Provided Reason for Exam: cirrhosis, r/o ascites Acuity: Acute Type of Exam: Initial  FINDINGS: Study is limited by body habitus.  LIVER: Evaluation of the liver is limited.  There is diffuse increased echogenicity with no focal lesion noted.  Somewhat nodular contour of the liver is compatible with known cirrhosis.  BILIARY SYSTEM: Limited imaging of the gallbladder is grossly unremarkable.  Common bile duct is within normal limits measuring 6 mm.  KIDNEYS: Right kidney is nonvisualized.  Left kidney measures 10.1 x 4.9 x 5.3 cm and is grossly unremarkable.  PANCREAS: Pancreas is nonvisualized.  SPLEEN: Spleen measures 10.1 cm.  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  IVC: The IVC is patent.  AORTA: Aorta is obscured.  OTHER: No evidence of ascites.     07/08/2014   IMPRESSION: Limited study as above.  Cirrhosis and portal hypertension.  No evidence of ascites.     Xr Chest Portable    07/08/2014   EXAMINATION: SINGLE VIEW OF THE CHEST  07/07/2014 11:34 pm  COMPARISON: 06/03/2014  HISTORY: ORDERING SYSTEM PROVIDED HISTORY: other TECHNOLOGIST PROVIDED HISTORY: Ordering Physician Provided Reason for Exam: dizziness Acuity: Unknown Type of Exam: Unknown  FINDINGS: Cardiac leads project over the chest.  Diffuse interstitial opacity is demonstrated bilaterally.  Heterogeneous opacity is seen adjacent to the left heart border.  No evidence of pleural effusion.  Negative for pneumothorax. Cardiac and mediastinal silhouettes are similar to prior.  Old posterior left 7th rib fracture.     07/08/2014   IMPRESSION: Mild pulmonary edema.     Vl Extremity Venous Right    07/10/2014   Lower Extremities DVT Study   Demographics    Patient Name      Andrew [name]    Date of Study     07/09/2014          Gender               Male    Patient Number    [id number]          Date of Birth       May 05, 1940    Visit Number      [id number]         Age                 52 year(s)    Accession Number  [id number]           Room Number         [id number]    Corporate ID      [id number]            Sonographer  Erasmo Leventhal,                                                            RVS    Ordering          Tasia Catchings Melene Plan,   Interpreting        [facility]  Physician         MD                  Physician           Peggyann Shoals,                                                            MD, [facility] [place]   Procedure  Type of Study:    Veins:Lower Extremities DVT Study, VL EXTREMITY VENOUS DUPLEX RIGHT.    Generic Orders:VL EXTREMITY VENOUS DUPLEX RIGHT.    Vascular Sonographer Report   Additional Indications:Swelling  Impressions Right Impression Acute totally occluding deep vein thrombosis involving the right proximal PTV and one gastroc vein zone 5-6. Acute partially occluding deep vein thrombosis involving the popliteal vein and right one gastroc vein zone 5-6. No other evidence of deep vein or superficial vein thrombosis involving the right lower extremity and the left common femoral vein. Calf veins were not well visualized on the right due to patient positioning and edema.  Verbal to [employer].  Conclusions    Summary    Acute totally occluding deep vein thrombosis involving the right proximal  PTV and one gastroc vein zone 5-6.  Acute partially occluding deep vein thrombosis involving the popliteal vein  and right one gastroc vein zone 5-6.  Calf veins were not well visualized on the right due to patient positioning  and edema.    Signature    ------------------------------------------------------------------  Electronically signed by Peggyann Shoals, MD, [facility], [facility]  (Interpreting physician) on 07/10/2014 at 11:34 AM  ------------------------------------------------------------------   Patient Status:Routine. [place] - Vascular Lab. Technical Quality:Poor visualization.  Risk Factors History +------------------+----------+----------------------------------------------+ !Diagnosis         !Date      !Comments                                      ! +------------------+----------+----------------------------------------------+ !Previous Procedure!06/27/2014!Venous Doppler Bilateral: Acute DVT right     ! !                  !          !gastroc veins.                                ! +------------------+----------+----------------------------------------------+  Velocities are measured in cm/s ; Diameters are measured in mm  Right Lower Extremities DVT Study Measurements Right 2D Measurements +------------------------+----------+---------------+----------+ !Location                !Visualized!Compressibility!Thrombosis! +------------------------+----------+---------------+----------+ !Sapheno Femoral Junction!Yes       !  Yes            !None      ! +------------------------+----------+---------------+----------+ !GSV Thigh               !Yes       !Yes            !None      ! +------------------------+----------+---------------+----------+ !Common Femoral          !Yes       !Yes            !None      ! +------------------------+----------+---------------+----------+ !Prox Femoral            !Yes       !Yes            !None      ! +------------------------+----------+---------------+----------+ !Mid Femoral             !Yes       !Yes            !None      ! +------------------------+----------+---------------+----------+ !Dist Femoral            !Yes       !Yes            !None      ! +------------------------+----------+---------------+----------+ !Deep Femoral            !Yes       !Yes            !None      ! +------------------------+----------+---------------+----------+ !Popliteal               !Yes       !Partial        !Acute     !  +------------------------+----------+---------------+----------+ !GSV Below Knee          !Yes       !Yes            !None      ! +------------------------+----------+---------------+----------+ !Gastroc                 !Yes       !Partial        !Acute     ! +------------------------+----------+---------------+----------+ !PTV                     !Yes       !No             !Acute     ! +------------------------+----------+---------------+----------+ !Peroneal                !Yes       !Yes            !None      ! +------------------------+----------+---------------+----------+ !SSV                     !Yes       !Yes            !None      ! +------------------------+----------+---------------+----------+  Right Doppler Measurements +--------------+------+------+------------+ !Location      !Signal!Reflux!Reflux (sec)! +--------------+------+------+------------+ !Common Femoral!Phasic!      !            ! +--------------+------+------+------------+ !Femoral       !Phasic!      !            ! +--------------+------+------+------------+ !Deep Femoral  !Phasic!      !            ! +--------------+------+------+------------+ !  Popliteal     !Phasic!      !            ! +--------------+------+------+------------+  Left Lower Extremities DVT Study Measurements Left 2D Measurements +------------------------+----------+---------------+----------+ !Location                !Visualized!Compressibility!Thrombosis! +------------------------+----------+---------------+----------+ !Sapheno Femoral Junction!Yes       !Yes            !None      ! +------------------------+----------+---------------+----------+ !Common Femoral          !Yes       !Yes            !None      ! +------------------------+----------+---------------+----------+  Left Doppler Measurements +--------------+------+------+------------+ !Location      !Signal!Reflux!Reflux (sec)! +--------------+------+------+------------+ !Common Femoral!Phasic!      !             ! +--------------+------+------+------------+    Cta Pulmonary With Contrast    06/27/2014   EXAMINATION: CTA OF THE CHEST 06/27/2014 2:53 pm  TECHNIQUE: CTA of the chest was performed after the administration of intravenous contrast.  Multiplanar reformatted images are provided for review.  MIP images are provided for review.  Patient received 100 mL Isovue 370 intravenous contrast.  COMPARISON: CT chest with contrast June 13, 2014.  HISTORY: ORDERING SYSTEM PROVIDED HISTORY: DVT (deep venous thrombosis), unspecified laterality [facility]) TECHNOLOGIST PROVIDED HISTORY: Ordering Physician Provided Reason for Exam: DVT, SOB Injury/Trauma or Illness: Illness/Other Acuity: Acute Type of Exam: Initial Relevant Medica/Surgical History: CHF, cardiomyopathy  FINDINGS: Pulmonary Arteries: Pulmonary arteries are adequately opacified for evaluation.  No evidence of intraluminal filling defect to suggest pulmonary embolism.  Main pulmonary artery is normal in caliber.  Mediastinum: No evidence of mediastinal lymphadenopathy.  Heart is enlarged, similar in appearance.  The heart and pericardium demonstrate no acute abnormality.  There is no acute abnormality of the thoracic aorta. Nodularity of thyroid similar in appearance.  Lungs/pleura: The central airways are patent.  There is a moderate right-sided pleural effusion which appears slightly decreased compared to prior study.  There is mild patchy dependent opacities in the right upper lobe, similar in appearance.  There are dependent subpleural opacities within the lingula and left lower lobe.  There is mild opacity within the right middle lobe which is linear in appearance in appears slightly improved compared to prior study.  2 nodular appearing densities are again seen in the right lower lobe on image number 65, unchanged.  No pneumothorax.  Upper Abdomen: Cirrhotic liver with mildly distended gallbladder is similar in appearance to prior study.  Soft Tissues/Bones: Multiple  compression fractures again seen, most pronounced at the thoracolumbar junction, similar in appearance.  Old right-sided rib fractures are also seen.  No acute bone or soft tissue abnormality.  Gynecomastia is again seen.     06/27/2014   IMPRESSION: No evidence of pulmonary embolism.  Moderate right pleural effusion, minimally decreased.  Persistent patchy areas of airspace opacity which may be related to atelectasis versus pneumonia, slightly improved.  2 nodular appearing densities are seen within the right lower lobe which may be related to the airspace disease or represent lung nodules.  Follow-up recommended with dedicated chest CT in 3 months.  Remote compression fractures again seen.  Cirrhotic liver.     Vl Extremity Venous Bilateral    07/09/2014   Vascular Lower Extremities DVT Study Procedure  -- PRELIMINARY SONOGRAPHER REPORT --    Demographics    Patient Name  Andrew [name]    Date of Study     07/09/2014          Gender              Male    Patient Number    [id number]          Date of Birth       [birth year]    Visit Number      [id number]         Age                 24 year(s)    Accession Number  [id number]           Room Number         3536    Corporate ID      [id number]            Sonographer         Erasmo Leventhal,                                                            RVS    Ordering          Deniece Portela,   Interpreting        [facility]  Physician         MD                  Physician   Procedure  Type of Study:    Veins:Lower Extremities DVT Study, VL EXTREMITY VENOUS DUPLEX RIGHT.   Tech Comments Right Acute totally occluding deep vein thrombosis involving the right proximal PTV and one gastroc vein zone 5-6. Acute partially occluding deep vein thrombosis involving the popliteal vein and right one gastroc vein zone 5-6. No other evidence of deep vein or superficial vein thrombosis involving the right lower extremity and the left common femoral vein. Calf veins were not well  visualized on the right due to patient positioning and edema.  verbal to [employer].    Vl Extremity Venous Bilateral    06/27/2014   Lower Extremities DVT Study   Demographics    Patient Name       Andrew [name]    Date of Study      06/27/2014        Gender              Male    Patient Number     [id number]        Date of Birth       1941/02/08    Visit Number       [id number]       Age                 74 year(s)    Accession Number   [id number]         Room Number    Corporate ID       [id number]          Sonographer         Erasmo Leventhal,  RVS    Ordering Physician Lacretia Leigh, MD  Interpreting        [facility]                                       Physician           Darlyn Chamber MD,                                                           [facility]   Procedure  Type of Study:    Veins:Lower Extremities DVT Study, VASC EXTREMITY VENOUS DUPLEX BILATERAL.    Vascular Sonographer Report   Additional Indications:Swelliing  Impressions Right Impression Acute partially occluding deep vein thrombosis involving the right set of gastroc veins zone 5-6. No other evidence of deep vein or superficial vein thrombosis involving the right lower extremity. Reflux noted in the right FV. Left Impression No evidence of deep vein or superficial vein thrombosis involving the left lower extremity.  Verbal to Dr. Jeb Levering. Patient released.  Conclusions    Summary    -Acute partially occluding deep vein thrombosis involving the right set of  gastroc veins zone 5-6.  -Reflux noted in the right FV.    Signature    ------------------------------------------------------------------  Electronically signed by Darlyn Chamber MD, [facility] (Interpreting  physician) on 06/27/2014 at 01:38 PM  ------------------------------------------------------------------   Patient Status:Routine. [place] - Vascular Lab. Technical Quality:Adequate visualization.  Velocities  are measured in cm/s ; Diameters are measured in mm  Right Lower Extremities DVT Study Measurements Right 2D Measurements +------------------------+----------+---------------+----------+ !Location                !Visualized!Compressibility!Thrombosis! +------------------------+----------+---------------+----------+ !Sapheno Femoral Junction!Yes       !Yes            !None      ! +------------------------+----------+---------------+----------+ !GSV Thigh               !Yes       !Yes            !None      ! +------------------------+----------+---------------+----------+ !Common Femoral          !Yes       !Yes            !None      ! +------------------------+----------+---------------+----------+ !Prox Femoral            !Yes       !Yes            !None      ! +------------------------+----------+---------------+----------+ !Mid Femoral             !Yes       !Yes            !None      ! +------------------------+----------+---------------+----------+ !Dist Femoral            !Yes       !Yes            !None      ! +------------------------+----------+---------------+----------+ !Deep Femoral            !Yes       !Yes            !  None      ! +------------------------+----------+---------------+----------+ !Popliteal               !Yes       !Yes            !None      ! +------------------------+----------+---------------+----------+ !GSV Below Knee          !Yes       !Yes            !None      ! +------------------------+----------+---------------+----------+ !Gastroc                 !Yes       !Partial        !Acute     ! +------------------------+----------+---------------+----------+ !PTV                     !Yes       !Yes            !None      ! +------------------------+----------+---------------+----------+ !Peroneal                !Yes       !Yes            !None      ! +------------------------+----------+---------------+----------+ !SSV                     !Yes       !Yes            !None      !  +------------------------+----------+---------------+----------+  Right Doppler Measurements +------------------------+------+------+------------+ !Location                !Signal!Reflux!Reflux (sec)! +------------------------+------+------+------------+ !Sapheno Femoral Junction!Phasic!No    !            ! +------------------------+------+------+------------+ !Common Femoral          !Phasic!No    !            ! +------------------------+------+------+------------+ !Femoral                 !Phasic!Yes   !            ! +------------------------+------+------+------------+ !Deep Femoral            !Phasic!No    !            ! +------------------------+------+------+------------+ !Popliteal               !Phasic!No    !            ! +------------------------+------+------+------------+  Left Lower Extremities DVT Study Measurements Left 2D Measurements +------------------------+----------+---------------+----------+ !Location                !Visualized!Compressibility!Thrombosis! +------------------------+----------+---------------+----------+ !Sapheno Femoral Junction!Yes       !Yes            !None      ! +------------------------+----------+---------------+----------+ !GSV Thigh               !Yes       !Yes            !None      ! +------------------------+----------+---------------+----------+ !Common Femoral          !Yes       !Yes            !None      ! +------------------------+----------+---------------+----------+ !Prox Femoral            !Yes       !Yes            !  None      ! +------------------------+----------+---------------+----------+ !Mid Femoral             !Yes       !Yes            !None      ! +------------------------+----------+---------------+----------+ !Dist Femoral            !Yes       !Yes            !None      ! +------------------------+----------+---------------+----------+ !Deep Femoral            !Yes       !Yes            !None      !  +------------------------+----------+---------------+----------+ !Popliteal               !Yes       !Yes            !None      ! +------------------------+----------+---------------+----------+ !GSV Below Knee          !Yes       !Yes            !None      ! +------------------------+----------+---------------+----------+ !Gastroc                 !Yes       !Yes            !None      ! +------------------------+----------+---------------+----------+ !PTV                     !Yes       !Yes            !None      ! +------------------------+----------+---------------+----------+ !Peroneal                !Yes       !Yes            !None      ! +------------------------+----------+---------------+----------+ !SSV                     !Yes       !Yes            !None      ! +------------------------+----------+---------------+----------+  Left Doppler Measurements +------------------------+------+------+------------+ !Location                !Signal!Reflux!Reflux (sec)! +------------------------+------+------+------------+ !Sapheno Femoral Junction!Phasic!No    !            ! +------------------------+------+------+------------+ !Common Femoral          !Phasic!No    !            ! +------------------------+------+------+------------+ !Femoral                 !Phasic!No    !            ! +------------------------+------+------+------------+ !Deep Femoral            !Phasic!No    !            ! +------------------------+------+------+------------+ !Popliteal               !Phasic!No    !            ! +------------------------+------+------+------------+        Discharge Exam:  BP 108/65 mmHg   Pulse 79   Temp(Src) 98.7 ??F (37.1 ??C) (Oral)   Resp 14   Ht 5\' 5"  (  1.651 m)   Wt 175 lb 11.2 oz (79.697 kg)   BMI 29.24 kg/m2   SpO2 92%    General Appearance:    Alert, cooperative, no distress, appears stated age   Head:    Normocephalic, without obvious abnormality, atraumatic   Eyes:    PERRL, conjunctiva/corneas clear, EOM's  intact, fundi     benign, both eyes        Ears:    Normal TM's and external ear canals, both ears   Nose:   Nares normal, septum midline, mucosa normal, no drainage    or sinus tenderness   Throat:   Lips, mucosa, and tongue normal; teeth and gums normal   Neck:   Supple, symmetrical, trachea midline, no adenopathy;        thyroid:  No enlargement/tenderness/nodules; no carotid    bruit or JVD   Back:     Symmetric, no curvature, ROM normal, no CVA tenderness   Lungs:     Clear to auscultation bilaterally, respirations unlabored   Chest wall:    No tenderness or deformity   Heart:    Regular rate and rhythm, S1 and S2 normal, no murmur, rub   or gallop   Abdomen:     Soft, non-tender, bowel sounds active all four quadrants,     no masses, no organomegaly   Genitalia:    Normal male without lesion, discharge or tenderness   Rectal:    Normal tone, normal prostate, no masses or tenderness;    guaiac negative stool   Extremities:   Extremities normal, atraumatic, no cyanosis or edema   Pulses:   2+ and symmetric all extremities   Skin:   Skin color, texture, turgor normal, no rashes or lesions   Lymph nodes:   Cervical, supraclavicular, and axillary nodes normal   Neurologic:   CNII-XII intact. Normal strength, sensation and reflexes       throughout       Disposition: home    Discharge Medications:   [place], [place] Medication Instructions XNA:[id number]    Printed on:07/23/14 1425   Medication Information                      carvedilol (COREG) 6.25 MG tablet  3.125 mg in AM and 6.25 mg in PM             Cholecalciferol (VITAMIN D3) 1000 UNITS CAPS  Take by mouth daily              citalopram (CELEXA) 10 MG tablet  Take 1 tablet by mouth daily             diclofenac (VOLTAREN) 0.1 % ophthalmic solution  Place 1 drop into both eyes 4 times daily             eplerenone (INSPRA) 50 MG tablet  Take 1 tablet by mouth daily             furosemide (LASIX) 40 MG tablet  80 mg in AM, 40 mg PM             lactulose 20  GM/30ML SOLN  Take 30 mLs by mouth 2 times daily             lisinopril (PRINIVIL;ZESTRIL) 5 MG tablet  Take 1 tablet by mouth nightly             magnesium oxide (MAG-OX) 400 (241.3 MG) MG TABS tablet  TAKE ONE TABLET  BY MOUTH TWICE DAILY             MILK THISTLE PO  Take  by mouth.             Multiple Vitamins-Minerals (CENTRUM SILVER PO)  Take  by mouth daily.             rifaximin (XIFAXAN) 550 MG tablet  Take 550 mg by mouth 2 times daily.               traMADol (ULTRAM) 50 MG tablet  Take 1 tablet by mouth every 12 hours as needed for Pain                 Allergies:  Allergies   Allergen Reactions   ??? Spironolactone Other (See Comments)     gynecomastia       Follow up Instructions:  Follow-up with PCP: [place], DO in 2-4 wk .      Total time spent on day of discharge including face-to-face visit, examination, documentation, counseling, preparation of discharge plans and followup, and discharge medicine reconciliation and presciptions is 42 minutes    Signed:  Deniece Portela, MD  07/23/2014

## 2014-07-24 ENCOUNTER — Encounter

## 2014-07-24 ENCOUNTER — Ambulatory Visit: Admit: 2014-07-24 | Discharge: 2014-07-24 | Payer: MEDICARE | Attending: Internal Medicine | Primary: Internal Medicine

## 2014-07-24 DIAGNOSIS — K7682 Hepatic encephalopathy: Secondary | ICD-10-CM

## 2014-07-24 LAB — BASIC METABOLIC PANEL
Anion Gap: 7 (ref 3–16)
BUN: 22 mg/dL — ABNORMAL HIGH (ref 7–20)
CO2: 24 mmol/L (ref 21–32)
Calcium: 9 mg/dL (ref 8.3–10.6)
Chloride: 103 mmol/L (ref 99–110)
Creatinine: 1 mg/dL (ref 0.8–1.3)
GFR African American: >60 (ref >60)
GFR Non-African American: >60 (ref >60)
Glucose: 135 mg/dL — ABNORMAL HIGH (ref 70–99)
Potassium: 4.6 mmol/L (ref 3.5–5.1)
Sodium: 134 mmol/L — ABNORMAL LOW (ref 136–145)

## 2014-07-24 LAB — AMMONIA: Ammonia: 54 umol/L (ref 16–60)

## 2014-07-24 NOTE — Telephone Encounter (Signed)
Andrew [name] is returning a phone call regarding the expedited request that was sent

## 2014-07-24 NOTE — Progress Notes (Signed)
[facility]  Internal Medicine  Patient Encounter  Alecia Lemming, D.O., [facility]        Chief Complaint   Patient presents with   ??? Follow-Up from Hospital       HPI: 74 y.o. male who was actually scheduled for a 3 week F/U has since been admitted to the hospital.  He was admitted  5/2-5/9 with C/O dizziness.  Pt reported this was ongoing for 3 weeks.  He also reported that he was getting weaker and was having problems simply standing on his own.  Current issues include cirrhosis, systolic CHF, DVT, pancytopenia.  We referred him to Hematology.  Bone marrow biopsy showed findings C/W Myelodysplasia.  The pancytopenia also likely related to advanced liver disease.  He had fallen several times on the day of admission.  In the hospital Ammonia level was 70 and peaked at 113.  He was seen by GI and started on Lactulose and Xifaxan.  Ammonia level decreased to 47.  Family elected to take pt home .  Strangely, [place] stated he did not need HHC.   At the time of discharge he had been dressing himself and appeared stronger.  He remained on Lactulose and started having increased diarrhea.  Wife stopped the lactulose.  He then became unsteady again and confused.  He has 4 BM's per day.  Lactulose is 4 times daily.  Appetite is decent.  Urine output is good with clear urine.  His Lasix is 40 mg BID.      Dr. Christene Lye will be starting him on a chemotherapy drug. Wife states the plan was to start this medication to increase counts    Past Medical History   Diagnosis Date   ??? Hypertension    ??? Kidney stones    ??? Alpha 1 antitrypsin deficiency    ??? Thrombocytopenia ([place]) 04/30/2009   ??? BPH (benign prostatic hyperplasia)    ??? 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 Chronic systolic CHF (congestive heart failure) ([place]) 10/18/2013   ??? CAD (coronary artery disease)      Non-obstructive   ??? Compression fracture of lumbar vertebra (HCC)      multiple, lumbar   ??? Leukopenia    ??? Anemia    ??? Pneumonia     ??? DVT (deep venous thrombosis) ([place])    ??? Cancer ([place])    ??? Hyperlipidemia    ??? Arthritis    ??? Glaucoma    ??? Liver disease    ??? 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  Answers questions appropriately  HEENT: NC/AT, PERL, EOMI, Oral cavity Clear,  TM's NL, Nasal cavity clear.    NECK: Supple.  No thyromegaly.  No increased JVP  LYMPH: No C/SC nodes.  CV: S1 S2 NL, RRR.    PULM: Bibasilar crackles.    EXT: No C/C/E  GI: Abdomen is soft, NT, BS+, No hepatomegaly.  NEURO: No focal or lateralizing deficits.  No asterixis  VASC:  No carotid bruits.  Pulses symmetric  MS:  Diffuse muscle wasting.  Gait is slow with walker.  Forward flexed posture.      ASSESSMENT::  Encounter Diagnoses   Name Primary?   ??? 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 Cirrhosis, nonalcoholic ([place])    ??? Physical debility    ??? Chronic systolic CHF (congestive heart failure) ([place])    ??? Hypotension, unspecified hypotension type    ??? Myelodysplasia (myelodysplastic syndrome) ([place])        Plan:  1. Dietician--Discuss with Dr. Einar Grad regarding diet for Cirrhosis and hepatic encephalopathy  2. Needs Home health  3. NH3, BMP today  4. Continue Lactulose, but may need to taper back.    5. We discussed palliative and comfort care and even Hospice care.  Pt not in favor just yet.      Discussed medications with patient who voiced understanding of their use, indication and potential side effects.  Pt also understands the above recommendations.   All questions answered.

## 2014-07-24 NOTE — Patient Instructions (Addendum)
Myelodysplastic Syndromes: Care Instructions  Your Care Instructions  Myelodysplastic syndromes, also called MDS, are a group of rare conditions in which the bone marrow does not make enough healthy blood cells. Normally, the bone marrow makes red blood cells, white blood cells, and platelets. These cells carry oxygen in the blood, help the body fight infections, and help the blood clot. With MDS, you may feel weak and tired, get infections often, and bruise easily, although symptoms tend to vary.  MDS is not cancer. But in rare cases, MDS can turn into acute myeloid leukemia (AML), a type of cancer. Some people develop MDS after treatment for cancer or exposure to pesticides or other chemicals. But in most cases, the cause of MDS is not known.  Your doctor will use the results of blood tests to guide your treatment. There are many types of MDS, with different treatment plans for each. If you have enough red blood cells and are feeling all right, you may not need active treatment, but you and your doctor will want to watch your condition carefully. If you start feeling lightheaded and have no energy, you may need a blood transfusion. Your doctor also may give you antibiotics to prevent or treat infection.  Follow-up care is a key part of your treatment and safety. Be sure to make and go to all appointments, and call your doctor if you are having problems. It???s also a good idea to know your test results and keep a list of the medicines you take.  How can you care for yourself at home?  ?? Take your medicines exactly as prescribed. Call your doctor if you think you are having a problem with your medicine. You will get more details on the specific medicines your doctor prescribes.  ?? If your doctor prescribed antibiotics, take them as directed. Do not stop taking them just because you feel better. You need to take the full course of antibiotics. If you have side effects from antibiotics, tell your doctor.  ?? Take  steps to control your stress and workload. Learn relaxation techniques.  ?? Share your feelings. Stress and tension affect our emotions. By expressing your feelings to others, you may be able to understand and cope with them.  ?? Consider joining a support group. Talking about a problem with your spouse, a good friend, or other people with similar problems is a good way to reduce tension and stress.  ?? Cry. Crying also can relieve tension. It is part of the emotional healing process.  ?? Express yourself with art. Try writing, crafts, dance, or art to relieve stress.  ?? Be kind to your body and mind. Getting enough sleep, eating a healthy diet, and taking time to do things you enjoy can contribute to an overall feeling of balance in your life and help reduce stress.  ?? Get help if you need it. Discuss your concerns with your doctor or counselor.  ?? Do not smoke. Smoking can make blood problems worse. If you need help quitting, talk to your doctor about stop-smoking programs and medicines. These can increase your chances of quitting for good.  ?? If you have not already done so, prepare a list of advance directives. Advance directives are instructions to your doctor and family members about what kind of care you want if you become unable to speak or express yourself.  ?? Call the [place] [phone]) or visit its website at [email or web address] for more information.  When should you call for  help?  Call 911 anytime you think you may need emergency care. For example, call if:  ?? You passed out (lost consciousness).  ?? You vomit blood or what looks like coffee grounds.  ?? You pass maroon or very bloody stools.  Call your doctor now or seek immediate medical care if:  ?? Your stools are black and tarlike or have streaks of blood.  ?? You have any unusual bleeding, such as:  ?? Blood spots under the skin.  ?? A nosebleed that you cannot stop.  ?? Bleeding gums when you brush your teeth.  ?? Blood in your  urine.  ?? Vaginal bleeding when you are not having your period, or heavy period bleeding.  ?? Your fatigue and weakness continue or get worse.  ?? You have signs of infection, such as:  ?? Increased pain, swelling, warmth, or redness.  ?? Red streaks leading from a sore.  ?? Pus draining from a sore.  ?? A fever.  Watch closely for changes in your health, and be sure to contact your doctor if you have any problems.   Where can you learn more?   Go to [email or web address] and sign in to your MyChart account. Enter U281 in the [place] box to learn more about ???Myelodysplastic Syndromes: Care Instructions.???    If you do not have an account, please click on the ???Sign Up Now??? link.     ?? 2006-2015 Healthwise, Incorporated. Care instructions adapted under license by [facility]. This care instruction is for use with your licensed healthcare professional. If you have questions about a medical condition or this instruction, always ask your healthcare professional. [place] any warranty or liability for your use of this information.  Content Version: 10.6.465758; Current as of: January 19, 2013              Liver Disease Diet: Care Instructions  Your Care Instructions  The liver does many jobs that are vital to the rest of your body. When something is wrong with the liver, your body may not get the nutrition it needs.  It is important that you eat a healthy diet that includes a variety of foods from the basic food groups: grains, vegetables, fruits, dairy, and protein foods. Follow your doctor's instructions for eating carbohydrate, protein, and fat in the right amounts for you. Your doctor also may limit salt or take salt out of your diet to help protect the liver. Always talk with your doctor or dietitian before you make changes in your diet.  Follow-up care is a key part of your treatment and safety. Be sure to make and go to all appointments, and call your doctor  if you are having problems. It's also a good idea to know your test results and keep a list of the medicines you take.  How can you care for yourself at home?  ?? Work with your doctor or dietitian to create a food plan that guides your daily food choices.  ?? Eat a balanced diet. Do not skip meals or go for many hours without eating. If you eat several small meals during the day, you have a better chance of getting the extra calories your body needs for energy.  ?? Your doctor may recommend a high-carbohydrate diet to get enough calories, since you may have to limit fat. You may need to spread carbohydrate throughout your meals and snacks to control the amount of sugar in your blood. Eat six small meals a  day, rather than three big ones. Carbohydrate is found in:  ?? Whole-grain and refined breads and cereals.  ?? Some vegetables.  ?? Cooked beans (such as kidney or black beans) and peas (such as lentils or split peas).  ?? Fruits.  ?? Low-fat or nonfat milk and milk products, which also supply protein.  ?? Candy, table sugar, and sugary soda drinks (try to limit these).  ?? Follow your doctor's or dietitian's instructions on how to get the right amount of protein in your diet. Examples of animal protein are:  ?? Meat, fish, and poultry.  ?? Eggs.  ?? Milk and milk products.  ?? Your doctor or dietitian may ask you to eat a certain amount of protein that comes from plants (rather than protein that comes from animals). You can get plant protein from foods such as:  ?? Cooked dried beans and peas.  ?? Peanut butter, nuts, and seeds.  ?? Tofu.  ?? Limit salt, if your doctor tells you to. This will help prevent fluid buildup in your belly and chest, which can cause serious problems. Salt is in many prepared foods, such as bacon, canned foods, snack foods, sauces, and soups. Look for reduced-salt products.  ?? Your doctor may recommend vitamin and mineral supplements. However, do not take any other medicine, including over-the-counter  medicines, vitamins, and herbal products, without talking to your doctor first.  ?? Do NOT take acetaminophen (Tylenol), ibuprofen (Advil, Motrin), or naproxen (Aleve). These can cause more liver damage.  ?? Do not drink any alcohol. It can harm your liver. Talk to your doctor if you need help to stop drinking.  ?? If you have a loss of appetite or have nausea or vomiting, try to:  ?? Stay away from foods and food smells that make you feel worse.  ?? Avoid greasy or fatty foods.  ?? Eat food that settles your stomach when it feels upset. Try crackers, dry toast, or ginger (ginger tea, hard ginger candy, or crystallized ginger).  When should you call for help?  Call 911 anytime you think you may need emergency care. For example, call if:  ?? You vomit blood or what looks like coffee grounds.  ?? You pass maroon or very bloody stools.  Call your doctor now or seek immediate medical care if:  ?? You have new belly pain or swelling.  ?? You have many nosebleeds or are bleeding from the gums or rectum.  ?? Your stools are black and tarlike or have streaks of blood.  ?? You have a fever.  Watch closely for changes in your health, and be sure to contact your doctor if:  ?? Your skin or the whites of your eyes turn yellow, or they are more yellow than they were before.  ?? You would like help to plan a diet that protects your liver.   Where can you learn more?   Go to [email or web address] and sign in to your MyChart account. Enter [phone] in the [place] box to learn more about ???Liver Disease Diet: Care Instructions.???    If you do not have an account, please click on the ???Sign Up Now??? link.     ?? 2006-2015 Healthwise, Incorporated. Care instructions adapted under license by [facility]. This care instruction is for use with your licensed healthcare professional. If you have questions about a medical condition or this instruction, always ask your healthcare professional. [place]  any warranty or liability for your use of this  information.  Content Version: 10.6.465758; Current as of: January 19, 2013

## 2014-07-24 NOTE — Telephone Encounter (Signed)
[place], spouse was returning a phone call. Advised would send a message for the office to call back the following business day.

## 2014-07-25 ENCOUNTER — Emergency Department: Admit: 2014-07-25 | Payer: MEDICARE | Primary: Internal Medicine

## 2014-07-25 ENCOUNTER — Inpatient Hospital Stay
Admission: EM | Admit: 2014-07-25 | Discharge: 2014-08-06 | Disposition: A | Discharge status: Skilled Nursing Facility | Payer: MEDICARE | Source: Home / Self Care | Admitting: Internal Medicine

## 2014-07-25 DIAGNOSIS — J189 Pneumonia, unspecified organism: Principal | ICD-10-CM

## 2014-07-25 LAB — COMPREHENSIVE METABOLIC PANEL
ALT: 18 U/L (ref 10–40)
AST: 27 U/L (ref 15–37)
Albumin/Globulin Ratio: 0.5 — ABNORMAL LOW (ref 1.1–2.2)
Albumin: 1.9 g/dL — ABNORMAL LOW (ref 3.4–5.0)
Alkaline Phosphatase: 100 U/L (ref 40–129)
Anion Gap: 8 (ref 3–16)
BUN: 22 mg/dL — ABNORMAL HIGH (ref 7–20)
CO2: 22 mmol/L (ref 21–32)
Calcium: 8.8 mg/dL (ref 8.3–10.6)
Chloride: 102 mmol/L (ref 99–110)
Creatinine: 1 mg/dL (ref 0.8–1.3)
GFR African American: >60 (ref >60)
GFR Non-African American: >60 (ref >60)
Globulin: 4.1 g/dL
Glucose: 108 mg/dL — ABNORMAL HIGH (ref 70–99)
Potassium: 4.3 mmol/L (ref 3.5–5.1)
Sodium: 132 mmol/L — ABNORMAL LOW (ref 136–145)
Total Bilirubin: 1 mg/dL (ref 0.0–1.0)
Total Protein: 6 g/dL — ABNORMAL LOW (ref 6.4–8.2)

## 2014-07-25 LAB — CBC WITH AUTO DIFFERENTIAL
Bands Relative: 2 % (ref 0–7)
Basophils %: 0 %
Basophils Absolute: 0 10*3/uL (ref 0.0–0.2)
Eosinophils %: 3 %
Eosinophils Absolute: 0.1 10*3/uL (ref 0.0–0.6)
Hematocrit: 24.5 % — ABNORMAL LOW (ref 40.5–52.5)
Hemoglobin: 8.1 g/dL — ABNORMAL LOW (ref 13.5–17.5)
Lymphocytes %: 74 %
Lymphocytes Absolute: 1.9 10*3/uL (ref 1.0–5.1)
MCH: 37.1 pg — ABNORMAL HIGH (ref 26.0–34.0)
MCHC: 33.3 g/dL (ref 31.0–36.0)
MCV: 111.7 fL — ABNORMAL HIGH (ref 80.0–100.0)
MPV: 8.7 fL (ref 5.0–10.5)
Monocytes %: 4 %
Monocytes Absolute: 0.1 10*3/uL (ref 0.0–1.3)
Neutrophils %: 17 %
Neutrophils Absolute: 0.5 10*3/uL — CL (ref 1.7–7.7)
PLATELET SLIDE REVIEW: DECREASED
Platelets: 73 10*3/uL — ABNORMAL LOW (ref 135–450)
RBC: 2.19 M/uL — ABNORMAL LOW (ref 4.20–5.90)
RDW: 26 % — ABNORMAL HIGH (ref 12.4–15.4)
WBC: 2.5 10*3/uL — ABNORMAL LOW (ref 4.0–11.0)

## 2014-07-25 LAB — EKG 12-LEAD
Atrial Rate: 66 {beats}/min
P Axis: 17 degrees
P-R Interval: 162 ms
Q-T Interval: 446 ms
QRS Duration: 104 ms
QTc Calculation (Bazett): 467 ms
R Axis: -18 degrees
T Axis: -17 degrees
Ventricular Rate: 66 {beats}/min

## 2014-07-25 LAB — APTT: aPTT: 46.5 s — ABNORMAL HIGH (ref 25.2–36.4)

## 2014-07-25 LAB — TROPONIN: Troponin: <0.01 ng/mL (ref <0.01)

## 2014-07-25 LAB — LACTIC ACID: Lactic Acid: 1.5 mmol/L (ref 0.4–2.0)

## 2014-07-25 LAB — LIPASE: Lipase: 12 U/L — ABNORMAL LOW (ref 13.0–60.0)

## 2014-07-25 LAB — PROTIME-INR
INR: 1.37 — ABNORMAL HIGH (ref 0.85–1.16)
Protime: 15.7 s — ABNORMAL HIGH (ref 9.8–13.0)

## 2014-07-25 LAB — BRAIN NATRIURETIC PEPTIDE: Pro-BNP: 196 pg/mL — ABNORMAL HIGH (ref 0–124)

## 2014-07-25 LAB — AMMONIA: Ammonia: 49 umol/L (ref 16–60)

## 2014-07-25 MED ORDER — ONDANSETRON HCL 4 MG/2ML IJ SOLN
4 MG/2ML | Freq: Four times a day (QID) | INTRAMUSCULAR | Status: DC | PRN
Start: 2014-07-25 — End: 2014-08-06

## 2014-07-25 MED ORDER — LACTULOSE 10 GM/15ML PO SOLN
10 GM/15ML | Freq: Two times a day (BID) | ORAL | Status: DC
Start: 2014-07-25 — End: 2014-07-25

## 2014-07-25 MED ORDER — HYDROCODONE-ACETAMINOPHEN 5-325 MG PO TABS
5-325 MG | ORAL | Status: DC | PRN
Start: 2014-07-25 — End: 2014-08-06

## 2014-07-25 MED ORDER — MORPHINE SULFATE (PF) 2 MG/ML IV SOLN
2 MG/ML | INTRAVENOUS | Status: DC | PRN
Start: 2014-07-25 — End: 2014-08-06

## 2014-07-25 MED ORDER — DEXTROSE 5 % IV SOLN
5 % | Freq: Once | INTRAVENOUS | Status: DC
Start: 2014-07-25 — End: 2014-07-25

## 2014-07-25 MED ORDER — FUROSEMIDE 40 MG PO TABS
40 MG | Freq: Every day | ORAL | Status: DC
Start: 2014-07-25 — End: 2014-08-06
  Administered 2014-07-25 – 2014-08-06 (×12): 40 mg via ORAL

## 2014-07-25 MED ORDER — POTASSIUM CHLORIDE 10 MEQ/100ML IV SOLN
10 MEQ/0ML | INTRAVENOUS | Status: DC | PRN
Start: 2014-07-25 — End: 2014-08-06

## 2014-07-25 MED ORDER — VANCOMYCIN HCL IN DEXTROSE 1-5 GM/200ML-% IV SOLN
1-5 GM/200ML-% | Freq: Once | INTRAVENOUS | Status: AC
Start: 2014-07-25 — End: 2014-07-25
  Administered 2014-07-25: 14:00:00 1000 mg via INTRAVENOUS

## 2014-07-25 MED ORDER — SODIUM CHLORIDE 0.9 % IV BOLUS
0.9 % | Freq: Once | INTRAVENOUS | Status: AC
Start: 2014-07-25 — End: 2014-07-25
  Administered 2014-07-25: 13:00:00 1000 mL via INTRAVENOUS

## 2014-07-25 MED ORDER — VANCOMYCIN HCL IN DEXTROSE 1-5 GM/200ML-% IV SOLN
1-5 GM/200ML-% | Freq: Two times a day (BID) | INTRAVENOUS | Status: DC
Start: 2014-07-25 — End: 2014-07-27
  Administered 2014-07-26 (×2): 1 g via INTRAVENOUS

## 2014-07-25 MED ORDER — RIFAXIMIN 550 MG PO TABS
550 MG | Freq: Two times a day (BID) | ORAL | Status: DC
Start: 2014-07-25 — End: 2014-08-06
  Administered 2014-07-25 – 2014-08-06 (×25): 550 mg via ORAL

## 2014-07-25 MED ORDER — SODIUM CHLORIDE 0.9 % IV SOLN
0.9 % | INTRAVENOUS | Status: DC
Start: 2014-07-25 — End: 2014-08-02
  Administered 2014-07-25 – 2014-07-30 (×6): via INTRAVENOUS

## 2014-07-25 MED ORDER — LISINOPRIL 5 MG PO TABS
5 MG | Freq: Every evening | ORAL | Status: DC
Start: 2014-07-25 — End: 2014-08-06
  Administered 2014-07-26 – 2014-08-06 (×8): 5 mg via ORAL

## 2014-07-25 MED ORDER — LACTULOSE 10 GM/15ML PO SOLN
10 GM/15ML | Freq: Three times a day (TID) | ORAL | Status: DC
Start: 2014-07-25 — End: 2014-07-25
  Administered 2014-07-25: 18:00:00 20 g via ORAL

## 2014-07-25 MED ORDER — EPLERENONE 25 MG PO TABS
25 MG | Freq: Every day | ORAL | Status: DC
Start: 2014-07-25 — End: 2014-08-06
  Administered 2014-07-25 – 2014-08-06 (×13): 50 mg via ORAL

## 2014-07-25 MED ORDER — NORMAL SALINE FLUSH 0.9 % IV SOLN
0.9 % | INTRAVENOUS | Status: DC | PRN
Start: 2014-07-25 — End: 2014-08-06

## 2014-07-25 MED ORDER — NORMAL SALINE FLUSH 0.9 % IV SOLN
0.9 % | Freq: Two times a day (BID) | INTRAVENOUS | Status: DC
Start: 2014-07-25 — End: 2014-08-06
  Administered 2014-07-28 – 2014-08-06 (×8): 10 mL via INTRAVENOUS

## 2014-07-25 MED ORDER — MORPHINE SULFATE (PF) 4 MG/ML IV SOLN
4 MG/ML | INTRAVENOUS | Status: DC | PRN
Start: 2014-07-25 — End: 2014-08-06

## 2014-07-25 MED ORDER — POTASSIUM CHLORIDE CRYS ER 20 MEQ PO TBCR
20 MEQ | ORAL | Status: DC | PRN
Start: 2014-07-25 — End: 2014-08-06

## 2014-07-25 MED ORDER — CEFEPIME HCL 1 G IJ SOLR
1 g | Freq: Once | INTRAMUSCULAR | Status: DC
Start: 2014-07-25 — End: 2014-07-25

## 2014-07-25 MED ORDER — DEXTROSE 5 % IV SOLN
5 % | Freq: Once | INTRAVENOUS | Status: AC
Start: 2014-07-25 — End: 2014-07-25
  Administered 2014-07-25: 15:00:00 2 g via INTRAVENOUS

## 2014-07-25 MED ORDER — VITAMIN D 1000 UNITS PO TABS
1000 UNIT | Freq: Every day | ORAL | Status: DC
Start: 2014-07-25 — End: 2014-08-06
  Administered 2014-07-25 – 2014-08-06 (×13): 1000 [IU] via ORAL

## 2014-07-25 MED ORDER — CIPROFLOXACIN IN D5W 400 MG/200ML IV SOLN
400 MG/200ML | Freq: Two times a day (BID) | INTRAVENOUS | Status: DC
Start: 2014-07-25 — End: 2014-07-27
  Administered 2014-07-25 – 2014-07-27 (×5): 400 mg via INTRAVENOUS

## 2014-07-25 MED ORDER — ENOXAPARIN SODIUM 40 MG/0.4ML SC SOLN
40 MG/0.4ML | Freq: Every day | SUBCUTANEOUS | Status: DC
Start: 2014-07-25 — End: 2014-07-25

## 2014-07-25 MED ORDER — POTASSIUM CHLORIDE 20 MEQ/15ML (10%) PO SOLN
20 MEQ/15ML (10%) | ORAL | Status: DC | PRN
Start: 2014-07-25 — End: 2014-08-06

## 2014-07-25 MED ORDER — CARVEDILOL 6.25 MG PO TABS
6.25 MG | Freq: Two times a day (BID) | ORAL | Status: DC
Start: 2014-07-25 — End: 2014-08-06
  Administered 2014-07-25 – 2014-08-06 (×17): 6.25 mg via ORAL

## 2014-07-25 MED ORDER — ENOXAPARIN SODIUM 80 MG/0.8ML SC SOLN
80 MG/0.8ML | Freq: Two times a day (BID) | SUBCUTANEOUS | Status: DC
Start: 2014-07-25 — End: 2014-07-30
  Administered 2014-07-25 – 2014-07-30 (×9): 80 mg/kg via SUBCUTANEOUS

## 2014-07-25 MED ORDER — TRAMADOL HCL 50 MG PO TABS
50 MG | Freq: Two times a day (BID) | ORAL | Status: DC | PRN
Start: 2014-07-25 — End: 2014-08-06
  Administered 2014-08-06: 11:00:00 50 mg via ORAL

## 2014-07-25 MED ORDER — PIPERACILLIN-TAZOBACTAM 3.375 G IN 50 ML (PREMIX) IVPB EXTENDED
Freq: Three times a day (TID) | INTRAVENOUS | Status: DC
Start: 2014-07-25 — End: 2014-07-27
  Administered 2014-07-25 – 2014-07-27 (×6): 3.375 g via INTRAVENOUS

## 2014-07-25 MED ORDER — ACETAMINOPHEN 325 MG PO TABS
325 MG | ORAL | Status: DC | PRN
Start: 2014-07-25 — End: 2014-08-06

## 2014-07-25 MED ORDER — MAGNESIUM HYDROXIDE 400 MG/5ML PO SUSP
400 MG/5ML | Freq: Every day | ORAL | Status: DC | PRN
Start: 2014-07-25 — End: 2014-08-06

## 2014-07-25 MED ORDER — LACTULOSE 10 GM/15ML PO SOLN
10 GM/15ML | Freq: Four times a day (QID) | ORAL | Status: DC
Start: 2014-07-25 — End: 2014-07-27
  Administered 2014-07-25 – 2014-07-27 (×6): 20 g via ORAL

## 2014-07-25 MED ORDER — CITALOPRAM HYDROBROMIDE 20 MG PO TABS
20 MG | Freq: Every day | ORAL | Status: DC
Start: 2014-07-25 — End: 2014-08-06
  Administered 2014-07-25 – 2014-08-06 (×13): 10 mg via ORAL

## 2014-07-25 MED ORDER — MAGNESIUM OXIDE 400 (241.3 MG) MG PO TABS
400 (241.3 Mg) MG | Freq: Every day | ORAL | Status: DC
Start: 2014-07-25 — End: 2014-08-06
  Administered 2014-07-25 – 2014-08-06 (×12): 400 mg via ORAL

## 2014-07-25 MED FILL — VANCOMYCIN HCL IN DEXTROSE 1-5 GM/200ML-% IV SOLN: 1-5 GM/200ML-% | INTRAVENOUS | Qty: 200

## 2014-07-25 MED FILL — ZOSYN 3-0.375 GM/50ML IV SOLN: INTRAVENOUS | Qty: 50

## 2014-07-25 MED FILL — MAXIPIME 1 G IJ SOLR: 1 g | INTRAMUSCULAR | Qty: 2

## 2014-07-25 MED FILL — MAGNESIUM OXIDE 400 (241.3 MG) MG PO TABS: 400 (241.3 Mg) MG | ORAL | Qty: 1

## 2014-07-25 MED FILL — EPLERENONE 25 MG PO TABS: 25 MG | ORAL | Qty: 2

## 2014-07-25 MED FILL — XIFAXAN 550 MG PO TABS: 550 MG | ORAL | Qty: 1

## 2014-07-25 MED FILL — CARVEDILOL 6.25 MG PO TABS: 6.25 MG | ORAL | Qty: 1

## 2014-07-25 MED FILL — LOVENOX 80 MG/0.8ML SC SOLN: 80 MG/0.8ML | SUBCUTANEOUS | Qty: 0.8

## 2014-07-25 MED FILL — CITALOPRAM HYDROBROMIDE 20 MG PO TABS: 20 MG | ORAL | Qty: 1

## 2014-07-25 MED FILL — LACTULOSE 10 GM/15ML PO SOLN: 10 GM/15ML | ORAL | Qty: 30

## 2014-07-25 MED FILL — SODIUM CHLORIDE 0.9 % IV SOLN: 0.9 % | INTRAVENOUS | Qty: 1000

## 2014-07-25 MED FILL — CIPRO IN D5W 400 MG/200ML IV SOLN: 400 MG/200ML | INTRAVENOUS | Qty: 200

## 2014-07-25 MED FILL — FUROSEMIDE 40 MG PO TABS: 40 MG | ORAL | Qty: 1

## 2014-07-25 MED FILL — VITAMIN D3 25 MCG (1000 UT) PO TABS: 25 MCG (1000 UT) | ORAL | Qty: 1

## 2014-07-25 MED FILL — AZITHROMYCIN 500 MG IV SOLR: 500 MG | INTRAVENOUS | Qty: 500

## 2014-07-25 NOTE — Other (Addendum)
Patient Acct Nbr:  [id number]  Primary AUTH/CERT:    [place] Name:   Andrew [name] HMO/GE/NEWHEALTH/PREFE/POS  Primary Insurance Plan Name:  [place]  Primary Insurance Group Number:  [id number]  Primary Insurance Plan Type: N  Primary Insurance Policy Number:  [id number]

## 2014-07-25 NOTE — Progress Notes (Signed)
Speech Language Pathology  Aspiration Screen    Name: Andrew [name]  DOB: 06-24-1940  Medical Diagnosis: PNEUMONIA [[id number]]    Swallow screen to rule out aspiration completed per pneumonia protocol.  Patient demonstrates mild high risk indicators for potential aspiration per swallow screen at this time.  No further swallowing intervention is warranted at this time.  Continue current diet as tolerated.  Please consult speech therapy for bedside swallow evaluation if symptoms of swallowing dysfunction arise.    Phillips Grout [phone]

## 2014-07-25 NOTE — H&P (Signed)
History and Physical  Dr. Tasia Catchings  07/25/2014    PCP: Alecia Lemming, DO    Cc: Andrew [name] is a 74 y.o. male who presents with Pneumonia.        HPI: 74 y.o. male presents to the emergency department accompanied by family for evaluation of generalized weakness. The patient is awake and alert, but very slow to respond to questions, and most of the history is provided by family, mainly his wife. They report gradual onset of generalized weakness over the past week. Patient was actually discharged from the hospital approximately one week ago after a one-week stay in the hospital for which he was evaluated for hepatic encephalopathy and was newly diagnosed with myelodysplastic syndrome. They report that he has been compliant with all medications this week, to include his lactulose 4 times a day. He has been eating reasonably well. He has been falling frequently due to his generalized weakness. They brought him in this morning because he was weak to the point where he was unable to walk or get out of bed. He has not passed out. No injuries from his falls. No fevers, chills, or malaise. No nausea or vomiting. No urinary symptoms. No other voiced complaints. After review of chest x-ray results with the family, his wife does mention that she noticed him developed a nonproductive cough yesterday. He did see his primary care physician yesterday.    CXR shows multifocal PNA  Some signs of asterxis  Will be admitted for tx of HCAP  GI asked to see for asterixis  Heme notified of admit given recent dx of MBS    Pt denies fevers, denies chills.  Pt denies chest pain, denies SOB, denies nausea, denies vomiting, denies  headache.        PMHX:   Past Medical History   Diagnosis Date   ??? Hypertension    ??? Kidney stones    ??? Alpha 1 antitrypsin deficiency    ??? Thrombocytopenia ([place]) 04/30/2009   ??? BPH (benign prostatic hyperplasia)    ??? 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 Chronic systolic CHF (congestive heart  failure) ([place]) 10/18/2013   ??? CAD (coronary artery disease)      Non-obstructive   ??? Compression fracture of lumbar vertebra (H[facility])      multiple, lumbar   ??? Leukopenia    ??? Anemia    ??? Pneumonia    ??? DVT (deep venous thrombosis) ([place])    ??? Cancer ([place])    ??? Hyperlipidemia    ??? Arthritis    ??? Glaucoma    ??? Liver disease    ??? Myelodysplasia (myelodysplastic syndrome) ([place]) 07/19/2014       PSHX:   Past Surgical History   Procedure Laterality Date   ??? Av fistula repair  1970's   ??? Colonoscopy  4/12     5y   ??? Cardiac catheterization     ??? Eye surgery  04/12/2014     Laser Eye Surgery for glaucoma   ??? Cosmetic surgery  04/2013     eye lids   ??? Bone marrow biopsy     ??? Knee surgery         SocHx:   History     Social History   ??? Marital Status: Married     Spouse Name: N/A     Number of Children: N/A   ??? Years of Education: N/A  Occupational History   ??? Not on file.     Social History Main Topics   ??? Smoking status: Never Smoker    ??? Smokeless tobacco: Never Used      Comment: Passive smoke exposure: yes   ??? Alcohol Use: No   ??? Drug Use: No   ??? Sexual Activity: Not on file     Other Topics Concern   ??? Not on file     Social History Narrative       FAM HX:   Family History   Problem Relation Age of Onset   ??? Diabetes Mother    ??? Hypertension Mother    ??? Diabetes Sister    ??? Diabetes Brother        Code Status: Full Code      Meds - list of home medications reviewed in electronic chart and confirmed  No current facility-administered medications on file prior to encounter.     Current Outpatient Prescriptions on File Prior to Encounter   Medication Sig Dispense Refill   ??? enoxaparin (LOVENOX) 80 MG/0.8ML injection Inject 0.8 mLs into the skin 2 times daily for 14 days 1 Syringe 3   ??? citalopram (CELEXA) 10 MG tablet Take 1 tablet by mouth daily 30 tablet 3   ??? furosemide (LASIX) 40 MG tablet 80 mg in AM, 40 mg PM 90 tablet 5   ??? carvedilol (COREG) 6.25 MG tablet 3.125 mg in AM and 6.25 mg in PM 60 tablet 3   ??? eplerenone  (INSPRA) 50 MG tablet Take 1 tablet by mouth daily 30 tablet 3   ??? lisinopril (PRINIVIL;ZESTRIL) 5 MG tablet Take 1 tablet by mouth nightly 90 tablet 3   ??? magnesium oxide (MAG-OX) 400 (241.3 MG) MG TABS tablet TAKE ONE TABLET BY MOUTH TWICE DAILY 60 tablet 5   ??? traMADol (ULTRAM) 50 MG tablet Take 1 tablet by mouth every 12 hours as needed for Pain 60 tablet 0   ??? lactulose 20 GM/30ML SOLN Take 30 mLs by mouth 2 times daily (Patient taking differently: Take 20 g by mouth 4 times daily ) 5400 mL 1   ??? Cholecalciferol (VITAMIN D3) 1000 UNITS CAPS Take by mouth daily      ??? MILK THISTLE PO Take  by mouth.     ??? rifaximin (XIFAXAN) 550 MG tablet Take 550 mg by mouth 2 times daily.       ??? Multiple Vitamins-Minerals (CENTRUM SILVER PO) Take  by mouth daily.             Allergies   Allergen Reactions   ??? Spironolactone Other (See Comments)     gynecomastia       ROS: As noted above in HPI, otherwise 10 system review gone over with patient and is otherwise negative.    Active Hospital Problems    Diagnosis Date Noted   ??? Pneumonia [J18.9] 07/25/2014   ??? Myelodysplasia (myelodysplastic syndrome) ([place]) [D46.9] 07/19/2014   ??? Hepatic encephalopathy ([place]) [K72.90] 07/07/2014   ??? Benign essential HTN [I10] 07/03/2014         EXAM:  Blood pressure 95/58, pulse 66, temperature 97.8 ??F (36.6 ??C), temperature source Temporal, resp. rate 18, height $RemoveBe'5\' 5"'lUQDERzjb$  (1.651 m), weight 171 lb (77.565 kg), SpO2 95 %.  BP 97/58 mmHg   Pulse 75   Temp(Src) 98.1 ??F (36.7 ??C) (Temporal)   Resp 16   Ht $R'5\' 5"'gr$  (1.651 m)   Wt 171 lb (77.565 kg)   BMI  28.46 kg/m2   SpO2 95%    General Appearance:    Alert, cooperative, no distress, appears stated age   Head:    Normocephalic, without obvious abnormality, atraumatic   Eyes:    PERRL, conjunctiva/corneas clear, EOM's intact, fundi     benign, both eyes        Ears:    Normal TM's and external ear canals, both ears   Nose:   Nares normal, septum midline, mucosa normal, no drainage    or sinus tenderness    Throat:   Lips, mucosa, and tongue normal; teeth and gums normal   Neck:   Supple, symmetrical, trachea midline, no adenopathy;        thyroid:  No enlargement/tenderness/nodules; no carotid    bruit or JVD   Back:     Symmetric, no curvature, ROM normal, no CVA tenderness   Lungs:     Rales bilaterally, respirations unlabored   Chest wall:    No tenderness or deformity   Heart:    Regular rate and rhythm, S1 and S2 normal, no murmur, rub   or gallop   Abdomen:     Soft, non-tender, bowel sounds active all four quadrants,     no masses, no organomegaly   Genitalia:    Normal male without lesion, discharge or tenderness   Rectal:    Normal tone, normal prostate, no masses or tenderness;    guaiac negative stool   Extremities:   Extremities normal, atraumatic, no cyanosis or edema   Pulses:   2+ and symmetric all extremities   Skin:   Skin color, texture, turgor normal, no rashes or lesions   Lymph nodes:   Cervical, supraclavicular, and axillary nodes normal   Neurologic:   CNII-XII intact. Normal strength, sensation and reflexes       throughout         LABS:  Labs Reviewed   CBC WITH AUTO DIFFERENTIAL - Abnormal; Notable for the following:     WBC 2.5 (*)     RBC 2.19 (*)     Hemoglobin 8.1 (*)     Hematocrit 24.5 (*)     MCV 111.7 (*)     MCH 37.1 (*)     RDW 26.0 (*)     Platelets 73 (*)     Neutrophils Absolute 0.5 (*)     Smudge Cells Present (*)     Anisocytosis 2+ (*)     Macrocytes 2+ (*)     Polychromasia 1+ (*)     All other components within normal limits    Narrative:     CALL  [place]  [facility] tel. [phone],  Hematology results called to and read back by RN Kerri Perches, 07/25/2014  08:54, by [facility]   PROTIME-INR - Abnormal; Notable for the following:     Protime 15.7 (*)     INR 1.37 (*)     All other components within normal limits   APTT - Abnormal; Notable for the following:     aPTT 46.5 (*)     All other components within normal limits   COMPREHENSIVE METABOLIC PANEL - Abnormal; Notable for the  following:     Sodium 132 (*)     Glucose 108 (*)     BUN 22 (*)     Total Protein 6.0 (*)     Alb 1.9 (*)     Albumin/Globulin Ratio 0.5 (*)     All other components within normal limits  BRAIN NATRIURETIC PEPTIDE - Abnormal; Notable for the following:     Pro-BNP 196 (*)     All other components within normal limits   LIPASE - Abnormal; Notable for the following:     Lipase 12.0 (*)     All other components within normal limits   CULTURE BLOOD #1   CULTURE BLOOD #2   GRAM STAIN   AMMONIA   LACTIC ACID, PLASMA   TROPONIN   URINE RT REFLEX TO CULTURE   ERYTHROPOIETIN   COMPREHENSIVE METABOLIC PANEL         IMAGING:  Imaging studies from the ER have been reviewed in the computerized chart.  Xr Chest Standard Two Vw    07/25/2014   EXAMINATION: TWO VIEWS OF THE CHEST  07/25/2014 8:17 am  COMPARISON: Chest x-ray Jul 07, 2014  HISTORY: ORDERING SYSTEM PROVIDED HISTORY: generalized weakness, hypotension TECHNOLOGIST PROVIDED HISTORY: Ordering Physician Provided Reason for Exam: weakness Acuity: Unknown Type of Exam: Unknown  FINDINGS: There is new focal density of the right upper lobe above the hilum.  There also is new faint airspace disease of the lateral right lung base.  There is new focal opacity of the retrocardiac left lower lobe.  There is blunting of the left costophrenic angle consistent with pleural fluid.     07/25/2014   IMPRESSION: New bilateral multifocal airspace disease, most likely atelectasis or pneumonia although follow-up to ensure resolution is recommended.  New small to moderate size left pleural effusion.     Ct Head Wo Contrast    07/07/2014   EXAMINATION: CT OF THE HEAD WITHOUT CONTRAST  07/07/2014 11:11 pm  TECHNIQUE: CT of the head was performed without the administration of intravenous contrast.  COMPARISON: 04/01/2014  HISTORY: ORDERING SYSTEM PROVIDED HISTORY: dizzy TECHNOLOGIST PROVIDED HISTORY: Ordering Physician Provided Reason for Exam: dizzy  FINDINGS: BRAIN/VENTRICLES: There is no acute  intracranial hemorrhage, mass effect or midline shift.  No abnormal extra-axial fluid collection.  The gray-white differentiation is maintained without evidence of an acute infarct.  There is no evidence of hydrocephalus.  ORBITS: The visualized portion of the orbits demonstrate no acute abnormality.  SINUSES: There is minimal mucosal thickening in the left maxillary sinus. The remaining visualized paranasal sinuses are clear.  The mastoid air cells are unremarkable.  SOFT TISSUES/SKULL:  No acute abnormality of the visualized skull or soft tissues.     07/07/2014   IMPRESSION: No acute intracranial abnormality.     US Abdomen Complete    07/08/2014   EXAMINATION: COMPLETE ABDOMINAL ULTRASOUND 07/08/2014 6:04 pm  COMPARISON: None  HISTORY: ORDERING SYSTEM PROVIDED HISTORY: CIRRHOSIS TECHNOLOGIST PROVIDED HISTORY: Ordering Physician Provided Reason for Exam: cirrhosis, r/o ascites Acuity: Acute Type of Exam: Initial  FINDINGS: Study is limited by body habitus.  LIVER: Evaluation of the liver is limited.  There is diffuse increased echogenicity with no focal lesion noted.  Somewhat nodular contour of the liver is compatible with known cirrhosis.  BILIARY SYSTEM: Limited imaging of the gallbladder is grossly unremarkable.  Common bile duct is within normal limits measuring 6 mm.  KIDNEYS: Right kidney is nonvisualized.  Left kidney measures 10.1 x 4.9 x 5.3 cm and is grossly unremarkable.  PANCREAS: Pancreas is nonvisualized.  SPLEEN: Spleen measures 10.1 cm.  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  IVC: The IVC is patent.  AORTA: Aorta is obscured.  OTHER: No evidence of ascites.     07/08/2014   IMPRESSION: Limited study as above.  Cirrhosis and portal hypertension.  No  evidence of ascites.     Xr Chest Portable    07/08/2014   EXAMINATION: SINGLE VIEW OF THE CHEST  07/07/2014 11:34 pm  COMPARISON: 06/03/2014  HISTORY: ORDERING SYSTEM PROVIDED HISTORY: other TECHNOLOGIST PROVIDED HISTORY: Ordering Physician Provided Reason for Exam:  dizziness Acuity: Unknown Type of Exam: Unknown  FINDINGS: Cardiac leads project over the chest.  Diffuse interstitial opacity is demonstrated bilaterally.  Heterogeneous opacity is seen adjacent to the left heart border.  No evidence of pleural effusion.  Negative for pneumothorax. Cardiac and mediastinal silhouettes are similar to prior.  Old posterior left 7th rib fracture.     07/08/2014   IMPRESSION: Mild pulmonary edema.     Vl Extremity Venous Right    07/10/2014   Lower Extremities DVT Study   Demographics    Patient Name      Andrew [name]    Date of Study     07/09/2014          Gender              Male    Patient Number    [id number]          Date of Birth       03/03/1941    Visit Number      [id number]         Age                 71 year(s)    Accession Number  [id number]           Room Number         3790    Corporate ID      [id number]            Sonographer         Erasmo Leventhal,                                                            RVS    Ordering          Deniece Portela,   Interpreting        [facility]  Physician         MD                  Physician           Peggyann Shoals,                                                            MD, [facility], [place]   Procedure  Type of Study:    Veins:Lower Extremities DVT Study, VL EXTREMITY VENOUS DUPLEX RIGHT.    Generic Orders:VL EXTREMITY VENOUS DUPLEX RIGHT.    Vascular Sonographer Report   Additional Indications:Swelling  Impressions Right Impression Acute totally occluding deep vein thrombosis involving the right proximal PTV and one gastroc vein zone 5-6. Acute partially occluding deep vein thrombosis involving the popliteal vein and right one gastroc vein zone 5-6. No other evidence of deep vein or superficial vein thrombosis involving the right lower extremity and the left common femoral vein. Calf  veins were not well visualized on the right due to patient positioning and edema.  Verbal to [employer].  Conclusions    Summary    Acute totally  occluding deep vein thrombosis involving the right proximal  PTV and one gastroc vein zone 5-6.  Acute partially occluding deep vein thrombosis involving the popliteal vein  and right one gastroc vein zone 5-6.  Calf veins were not well visualized on the right due to patient positioning  and edema.    Signature    ------------------------------------------------------------------  Electronically signed by Moses Manners, MD, [facility], R[facility]  (Interpreting physician) on 07/10/2014 at 11:34 AM  ------------------------------------------------------------------   Patient Status:Routine. Study Location:[facility] - Vascular Lab. Technical Quality:Poor visualization.  Risk Factors History +------------------+----------+----------------------------------------------+ !Diagnosis         !Date      !Comments                                      ! +------------------+----------+----------------------------------------------+ !Previous Procedure!06/27/2014!Venous Doppler Bilateral: Acute DVT right     ! !                  !          !gastroc veins.                                ! +------------------+----------+----------------------------------------------+  Velocities are measured in cm/s ; Diameters are measured in mm  Right Lower Extremities DVT Study Measurements Right 2D Measurements +------------------------+----------+---------------+----------+ !Location                !Visualized!Compressibility!Thrombosis! +------------------------+----------+---------------+----------+ !Sapheno Femoral Junction!Yes       !Yes            !None      ! +------------------------+----------+---------------+----------+ !GSV Thigh               !Yes       !Yes            !None      ! +------------------------+----------+---------------+----------+ !Common Femoral          !Yes       !Yes            !None      ! +------------------------+----------+---------------+----------+ !Prox Femoral            !Yes       !Yes             !None      ! +------------------------+----------+---------------+----------+ !Mid Femoral             !Yes       !Yes            !None      ! +------------------------+----------+---------------+----------+ !Dist Femoral            !Yes       !Yes            !None      ! +------------------------+----------+---------------+----------+ !Deep Femoral            !Yes       !Yes            !None      ! +------------------------+----------+---------------+----------+ !Popliteal               !Yes       !Partial        !  Acute     ! +------------------------+----------+---------------+----------+ !GSV Below Knee          !Yes       !Yes            !None      ! +------------------------+----------+---------------+----------+ !Gastroc                 !Yes       !Partial        !Acute     ! +------------------------+----------+---------------+----------+ !PTV                     !Yes       !No             !Acute     ! +------------------------+----------+---------------+----------+ !Peroneal                !Yes       !Yes            !None      ! +------------------------+----------+---------------+----------+ !SSV                     !Yes       !Yes            !None      ! +------------------------+----------+---------------+----------+  Right Doppler Measurements +--------------+------+------+------------+ !Location      !Signal!Reflux!Reflux (sec)! +--------------+------+------+------------+ !Common Femoral!Phasic!      !            ! +--------------+------+------+------------+ !Femoral       !Phasic!      !            ! +--------------+------+------+------------+ !Deep Femoral  !Phasic!      !            ! +--------------+------+------+------------+ !Popliteal     !Phasic!      !            ! +--------------+------+------+------------+  Left Lower Extremities DVT Study Measurements Left 2D Measurements +------------------------+----------+---------------+----------+ !Location                 !Visualized!Compressibility!Thrombosis! +------------------------+----------+---------------+----------+ !Sapheno Femoral Junction!Yes       !Yes            !None      ! +------------------------+----------+---------------+----------+ !Common Femoral          !Yes       !Yes            !None      ! +------------------------+----------+---------------+----------+  Left Doppler Measurements +--------------+------+------+------------+ !Location      !Signal!Reflux!Reflux (sec)! +--------------+------+------+------------+ !Common Femoral!Phasic!      !            ! +--------------+------+------+------------+    Cta Pulmonary With Contrast    06/27/2014   EXAMINATION: CTA OF THE CHEST 06/27/2014 2:53 pm  TECHNIQUE: CTA of the chest was performed after the administration of intravenous contrast.  Multiplanar reformatted images are provided for review.  MIP images are provided for review.  Patient received 100 mL Isovue 370 intravenous contrast.  COMPARISON: CT chest with contrast June 13, 2014.  HISTORY: ORDERING SYSTEM PROVIDED HISTORY: DVT (deep venous thrombosis), unspecified laterality [facility]) TECHNOLOGIST PROVIDED HISTORY: Ordering Physician Provided Reason for Exam: DVT, SOB Injury/Trauma or Illness: Illness/Other Acuity: Acute Type of Exam: Initial Relevant Medica/Surgical History: CHF, cardiomyopathy  FINDINGS: Pulmonary Arteries: Pulmonary arteries are adequately opacified for evaluation.  No evidence of intraluminal filling defect to suggest pulmonary embolism.  Main pulmonary artery is  normal in caliber.  Mediastinum: No evidence of mediastinal lymphadenopathy.  Heart is enlarged, similar in appearance.  The heart and pericardium demonstrate no acute abnormality.  There is no acute abnormality of the thoracic aorta. Nodularity of thyroid similar in appearance.  Lungs/pleura: The central airways are patent.  There is a moderate right-sided pleural effusion which appears slightly decreased compared to prior study.   There is mild patchy dependent opacities in the right upper lobe, similar in appearance.  There are dependent subpleural opacities within the lingula and left lower lobe.  There is mild opacity within the right middle lobe which is linear in appearance in appears slightly improved compared to prior study.  2 nodular appearing densities are again seen in the right lower lobe on image number 65, unchanged.  No pneumothorax.  Upper Abdomen: Cirrhotic liver with mildly distended gallbladder is similar in appearance to prior study.  Soft Tissues/Bones: Multiple compression fractures again seen, most pronounced at the thoracolumbar junction, similar in appearance.  Old right-sided rib fractures are also seen.  No acute bone or soft tissue abnormality.  Gynecomastia is again seen.     06/27/2014   IMPRESSION: No evidence of pulmonary embolism.  Moderate right pleural effusion, minimally decreased.  Persistent patchy areas of airspace opacity which may be related to atelectasis versus pneumonia, slightly improved.  2 nodular appearing densities are seen within the right lower lobe which may be related to the airspace disease or represent lung nodules.  Follow-up recommended with dedicated chest CT in 3 months.  Remote compression fractures again seen.  Cirrhotic liver.     Vl Extremity Venous Bilateral    07/09/2014   Vascular Lower Extremities DVT Study Procedure  -- PRELIMINARY SONOGRAPHER REPORT --    Demographics    Patient Name      Andrew [name]    Date of Study     07/09/2014          Gender              Male    Patient Number    [id number]          Date of Birth       20-Mar-1940    Visit Number      [id number]         Age                 19 year(s)    Accession Number  [id number]           Room Number         1027    Corporate ID      [id number]            Sonographer         Erasmo Leventhal,                                                            RVS    Ordering          Deniece Portela,   Interpreting        [facility]  Vascular  Physician         MD                  Physician   Procedure  Type of  Study:    Veins:Lower Extremities DVT Study, VL EXTREMITY VENOUS DUPLEX RIGHT.   Tech Comments Right Acute totally occluding deep vein thrombosis involving the right proximal PTV and one gastroc vein zone 5-6. Acute partially occluding deep vein thrombosis involving the popliteal vein and right one gastroc vein zone 5-6. No other evidence of deep vein or superficial vein thrombosis involving the right lower extremity and the left common femoral vein. Calf veins were not well visualized on the right due to patient positioning and edema.  verbal to [employer].    Vl Extremity Venous Bilateral    06/27/2014   Lower Extremities DVT Study   Demographics    Patient Name       Andrew [name]    Date of Study      06/27/2014        Gender              Male    Patient Number     [id number]        Date of Birth       09/17/1940    Visit Number       [id number]       Age                 47 year(s)    Accession Number   [id number]         Room Number    Corporate ID       [id number]          Sonographer         Elberta Fortis,                                                           RVS    Ordering Physician Joycelyn Das, MD  Interpreting        [facility]                                       Physician           Wonda Cerise MD,                                                           [facility]   Procedure  Type of Study:    Veins:Lower Extremities DVT Study, VASC EXTREMITY VENOUS DUPLEX BILATERAL.    Vascular Sonographer Report   Additional Indications:Swelliing  Impressions Right Impression Acute partially occluding deep vein thrombosis involving the right set of gastroc veins zone 5-6. No other evidence of deep vein or superficial vein thrombosis involving the right lower extremity. Reflux noted in the right FV. Left Impression No evidence of deep vein or superficial vein thrombosis involving the left lower extremity.  Verbal to Dr. Adalberto Ill.  Patient released.  Conclusions    Summary    -Acute partially occluding deep vein thrombosis involving the right set of  gastroc veins zone 5-6.  -Reflux noted in the right FV.    Signature    ------------------------------------------------------------------  Electronically signed by  Wonda Cerise MD, [facility] (Interpreting  physician) on 06/27/2014 at 01:38 PM  ------------------------------------------------------------------   Patient Status:Routine. Study Location:[facility] - Vascular Lab. Technical Quality:Adequate visualization.  Velocities are measured in cm/s ; Diameters are measured in mm  Right Lower Extremities DVT Study Measurements Right 2D Measurements +------------------------+----------+---------------+----------+ !Location                !Visualized!Compressibility!Thrombosis! +------------------------+----------+---------------+----------+ !Sapheno Femoral Junction!Yes       !Yes            !None      ! +------------------------+----------+---------------+----------+ !GSV Thigh               !Yes       !Yes            !None      ! +------------------------+----------+---------------+----------+ !Common Femoral          !Yes       !Yes            !None      ! +------------------------+----------+---------------+----------+ !Prox Femoral            !Yes       !Yes            !None      ! +------------------------+----------+---------------+----------+ !Mid Femoral             !Yes       !Yes            !None      ! +------------------------+----------+---------------+----------+ !Dist Femoral            !Yes       !Yes            !None      ! +------------------------+----------+---------------+----------+ !Deep Femoral            !Yes       !Yes            !None      ! +------------------------+----------+---------------+----------+ !Popliteal               !Yes       !Yes            !None      ! +------------------------+----------+---------------+----------+ !GSV Below Knee           !Yes       !Yes            !None      ! +------------------------+----------+---------------+----------+ !Gastroc                 !Yes       !Partial        !Acute     ! +------------------------+----------+---------------+----------+ !PTV                     !Yes       !Yes            !None      ! +------------------------+----------+---------------+----------+ !Peroneal                !Yes       !Yes            !None      ! +------------------------+----------+---------------+----------+ !SSV                     !Yes       !Yes            !None      ! +------------------------+----------+---------------+----------+  Right Doppler Measurements +------------------------+------+------+------------+ !Location                !Signal!Reflux!Reflux (sec)! +------------------------+------+------+------------+ !Sapheno Femoral Junction!Phasic!No    !            ! +------------------------+------+------+------------+ !Common Femoral          !Phasic!No    !            ! +------------------------+------+------+------------+ !Femoral                 !Phasic!Yes   !            ! +------------------------+------+------+------------+ !Deep Femoral            !Phasic!No    !            ! +------------------------+------+------+------------+ !Popliteal               !Phasic!No    !            ! +------------------------+------+------+------------+  Left Lower Extremities DVT Study Measurements Left 2D Measurements +------------------------+----------+---------------+----------+ !Location                !Visualized!Compressibility!Thrombosis! +------------------------+----------+---------------+----------+ !Sapheno Femoral Junction!Yes       !Yes            !None      ! +------------------------+----------+---------------+----------+ !GSV Thigh               !Yes       !Yes            !None      ! +------------------------+----------+---------------+----------+ !Common Femoral          !Yes       !Yes            !None      !  +------------------------+----------+---------------+----------+ !Prox Femoral            !Yes       !Yes            !None      ! +------------------------+----------+---------------+----------+ !Mid Femoral             !Yes       !Yes            !None      ! +------------------------+----------+---------------+----------+ !Dist Femoral            !Yes       !Yes            !None      ! +------------------------+----------+---------------+----------+ !Deep Femoral            !Yes       !Yes            !None      ! +------------------------+----------+---------------+----------+ !Popliteal               !Yes       !Yes            !None      ! +------------------------+----------+---------------+----------+ !GSV Below Knee          !Yes       !Yes            !None      ! +------------------------+----------+---------------+----------+ !Gastroc                 !Yes       !Yes            !None      ! +------------------------+----------+---------------+----------+ !PTV                     !  Yes       !Yes            !None      ! +------------------------+----------+---------------+----------+ !Peroneal                !Yes       !Yes            !None      ! +------------------------+----------+---------------+----------+ !SSV                     !Yes       !Yes            !None      ! +------------------------+----------+---------------+----------+  Left Doppler Measurements +------------------------+------+------+------------+ !Location                !Signal!Reflux!Reflux (sec)! +------------------------+------+------+------------+ !Sapheno Femoral Junction!Phasic!No    !            ! +------------------------+------+------+------------+ !Common Femoral          !Phasic!No    !            ! +------------------------+------+------+------------+ !Femoral                 !Phasic!No    !            ! +------------------------+------+------+------------+ !Deep Femoral            !Phasic!No    !            !  +------------------------+------+------+------------+ !Popliteal               !Phasic!No    !            ! +------------------------+------+------+------------+      ASSESSMENT AND PLAN:  Patient Active Hospital Problem List:   Pneumonia (07/25/2014)    Assessment: pt with pna, recent hospitalization    Plan: admit, place on HCAP abx. 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  This determination is also based on the patients comorbidities and past medical history, the severity and timing of the signs and symptoms upon presentation.    Section for Face-to-Face requirement documentation    This portion of the note is to certify that I have had a face-to-face encounter with this patient. See above documentation for notes on actual encounter.    Please refer to the written order for the DME which includes;  1. the beneficiary's name,  2. the item of DME ordered,  3. the prescribing practitioner's National Provider Identifier (NPI),  4. the signature of the ordering practitioner and  5. the date of the order.    Per CMS Pub [address] Medicare Program Integrity, [phone]; Issued: 08-06-11; Effective: 09-06-11; Implementation:  09-06-11): A single confirming signature and date is sufficient in a situation where there are several  pertinent portions of the medical record.     The electronic signature on this note shall serve as the confirming signature that the beneficiary was evaluated and/or treated for a condition that supports the need for the item(s) of DME ordered.  Please refer to the appropriate sections of the medical record for documentation of the need for the ordered DME.      Any additional notes for the DME order to be attached below this line:      Deniece Portela  07/25/2014

## 2014-07-25 NOTE — ED Notes (Signed)
BP up to 93/50. Requested water at this time. Family remains at bedside. Will continue to monitor.    Alvina Filbert, RN  07/25/14 [phone]

## 2014-07-25 NOTE — Consults (Signed)
[facility]   Progress Note      SUBJECTIVE:      Patient of Dr. Al Corpus. 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 He was supposed to start [place] on 07/29/14.    He is admitted again with hepatic encephalopathy. CXR showed atelactesis vs pneumonia. He is being treated with broad spectrum abx.    OBJECTIVE    Physical  VITALS:  BP 95/58 mmHg   Pulse 66   Temp(Src) 97.8 ??F (36.6 ??C) (Temporal)   Resp 18   Ht 5\' 5"  (1.651 m)   Wt 171 lb (77.565 kg)   BMI 28.46 kg/m2   SpO2 96%  TEMPERATURE:  Current - Temp: 97.8 ??F (36.6 ??C); Max - Temp  Avg: 97.3 ??F (36.3 ??C)  Min: 96.8 ??F (36 ??C)  Max: 97.8 ??F (36.6 ??C)  BLOOD PRESSURE RANGE:  Systolic (84ONG), EXB:28 mmHg, Min:80 mmHg, UXL:24 mmHg  ; Diastolic (40NUU), VOZ:36 mmHg, Min:45 mmHg, Max:58 mmHg    24HR INTAKE/OUTPUT:    Intake/Output Summary (Last 24 hours) at 07/25/14 1527  Last data filed at 07/25/14 1327   Gross per 24 hour   Intake    240 ml   Output      0 ml   Net    240 ml       Drowsy  alert oriented  HEENT: Pallor   Neck: Supple. No lymphadenopathy  Lungs: Basal crackles + Respiratory efforts normal.  CVS: S1S2 normal. No murmurs or gallops.  Abdomen: Soft BS +. No organomegaly.   Extremities: No edema  Neuro: No focal deficits.  Skin: No Rash Petechiae      Data  Labs:  General Labs:  CBC with Differential:  Lab Results   Component Value Date    WBC 2.5 07/25/2014    WBC 2.8 07/19/2014    RBC 2.19 07/25/2014    RBC 2.44 07/19/2014    HGB 8.1 07/25/2014    HCT 24.5 07/25/2014    PLT 73 07/25/2014    MCV 111.7 07/25/2014    MCH 37.1 07/25/2014    MCHC 33.3 07/25/2014    RDW 26.0 07/25/2014    SEGSPCT 42.6 05/14/2011    BANDSPCT 2 07/25/2014    METASPCT 1 05/03/2014    LYMPHOPCT 74.0 07/25/2014    LYMPHOPCT 57.0 07/19/2014    MONOPCT 4.0 07/25/2014    MYELOPCT 1 05/03/2014    EOSPCT 2.1 05/14/2010    BASOPCT 0.0 07/25/2014    MONOSABS 0.1 07/25/2014    LYMPHSABS 1.9 07/25/2014     EOSABS 0.1 07/25/2014    BASOSABS 0.0 07/25/2014    DIFFTYPE Scan-K 05/14/2011     BMP:  Lab Results   Component Value Date    NA 132 07/25/2014    K 4.3 07/25/2014    CL 102 07/25/2014    CO2 22 07/25/2014    BUN 22 07/25/2014    LABALBU 1.9 07/25/2014    CREATININE 1.0 07/25/2014    CALCIUM 8.8 07/25/2014    GFRAA >60 07/25/2014    GFRAA >60 05/14/2011    LABGLOM >60 07/25/2014    GLUCOSE 108 07/25/2014     Hepatic Function Panel:  Lab Results   Component Value Date    ALKPHOS 100 07/25/2014    ALT 18 07/25/2014    AST 27 07/25/2014    PROT 6.0 07/25/2014    PROT 6.0 05/14/2011    BILITOT 1.0  07/25/2014    BILIDIR 0.4 07/09/2014    IBILI 0.7 07/09/2014    LABALBU 1.9 07/25/2014     LDH:  No results found for: LDH  PT/INR:    Lab Results   Component Value Date    PROTIME 15.7 07/25/2014    INR 1.37 07/25/2014    INR 1.17 05/14/2010     PTT:    Lab Results   Component Value Date    APTT 46.5 07/25/2014    APTT 29.7 10/17/2009   [APTT    Current Medications  Current facility-administered medications: carvedilol (COREG) tablet 6.25 mg, 6.25 mg, Oral, BID WC  vitamin D (CHOLECALCIFEROL) tablet 1,000 Units, 1,000 Units, Oral, Daily  citalopram (CELEXA) tablet 10 mg, 10 mg, Oral, Daily  enoxaparin (LOVENOX) injection 80 mg, 1 mg/kg, Subcutaneous, BID  eplerenone (INSPRA) tablet 50 mg, 50 mg, Oral, Daily  furosemide (LASIX) tablet 40 mg, 40 mg, Oral, Daily  lisinopril (PRINIVIL;ZESTRIL) tablet 5 mg, 5 mg, Oral, Nightly  magnesium oxide (MAG-OX) tablet 400 mg, 400 mg, Oral, Daily  rifaximin (XIFAXAN) tablet 550 mg, 550 mg, Oral, BID  traMADol (ULTRAM) tablet 50 mg, 50 mg, Oral, Q12H PRN  0.9 % sodium chloride infusion, , Intravenous, Continuous  sodium chloride flush 0.9 % injection 10 mL, 10 mL, Intravenous, 2 times per day  sodium chloride flush 0.9 % injection 10 mL, 10 mL, Intravenous, PRN  acetaminophen (TYLENOL) tablet 650 mg, 650 mg, Oral, Q4H PRN  HYDROcodone-acetaminophen (NORCO) 5-325 MG per tablet 1 tablet, 1  tablet, Oral, Q4H PRN **OR** HYDROcodone-acetaminophen (NORCO) 5-325 MG per tablet 2 tablet, 2 tablet, Oral, Q4H PRN  morphine (PF) injection 2 mg, 2 mg, Intravenous, Q2H PRN **OR** morphine (PF) injection 4 mg, 4 mg, Intravenous, Q2H PRN  magnesium hydroxide (MILK OF MAGNESIA) 400 MG/5ML suspension 30 mL, 30 mL, Oral, Daily PRN  ondansetron (ZOFRAN) injection 4 mg, 4 mg, Intravenous, Q6H PRN  ciprofloxacin (CIPRO) IVPB 400 mg, 400 mg, Intravenous, Q12H **AND** piperacillin-tazobactam (ZOSYN) IVPB extended infusion 3.375 g, 3.375 g, Intravenous, Q8H  vancomycin (VANCOCIN) 1000 mg in dextrose 5% 200 mL IVPB, 1 g, Intravenous, Q12H  lactulose (CHRONULAC) 10 GM/15ML solution 20 g, 20 g, Oral, TID    ASSESSMENT AND PLAN    1. Pneumonia: No fever. Continue abx. Hold Neupogen.     2. Hepatic encephalopathy    3. NASH    4. Pancytopenia: see below    5. Myelodysplastic syndrome: Was supposed to start Viadza on 07/29/14.    6. Anemia: Hb 8.1. Stable. No indication for transfusion.    Thank you for notification. Will follow.        Mare Loan, MD

## 2014-07-25 NOTE — ED Notes (Signed)
Vancomycin infusing per MD Order. Pt tolerating it well. No needs expressed. Will continue to monitor.    Alvina Filbert, RN  07/25/14 1036

## 2014-07-25 NOTE — Care Coordination-Inpatient (Signed)
Met with patient and his family, and he hopes to return home with spouse, and resume [facility], [phone], fax [phone].     MD, Please complete & sign the e-COC under the discharge instructions, AVS/Prescriptions, and or medical necessity note for assistance with discharge planning, Thank you, Sofie Hartigan, MSW, [phone]

## 2014-07-25 NOTE — ED Notes (Signed)
Pt repositioned for comfort. Family at bedside.    Alvina Filbert, RN  07/25/14 [phone]

## 2014-07-25 NOTE — ED Notes (Signed)
Bed: 11-01  Expected date:   Expected time:   Means of arrival:   Comments:  [facility]

## 2014-07-25 NOTE — Telephone Encounter (Signed)
Please see other telephone encounter.

## 2014-07-25 NOTE — ED Notes (Signed)
Report called to Jackelyn Poling, Therapist, sports. All questions answered at this time.    Alvina Filbert, RN  07/25/14 1116

## 2014-07-25 NOTE — ED Notes (Signed)
Warm blanket provided to pt at this time.    Alvina Filbert, RN  07/25/14 1036

## 2014-07-25 NOTE — Telephone Encounter (Signed)
Returned phone call to patient's son [name]. Instructed [name] to let the admitting doctor or the nurse know that patient is currently being followed by Dr. Al Corpus and he would like to consult him while at the hospital.  Jodelle Gross I would relay the message to Dr. Al Corpus.  [name] verbalized understanding.

## 2014-07-25 NOTE — ED Notes (Signed)
BP 80/45. Dr. Kalman Shan aware. Pt alert and oriented. Family at bedside.    Alvina Filbert, RN  07/25/14 1034

## 2014-07-25 NOTE — Progress Notes (Signed)
Specimen cup for sputum was left in patients room

## 2014-07-25 NOTE — Consults (Signed)
Pharmacy to dose vancomycin for pneumonia. Vancomycin 1000mg  x 1 in ED. Continue vancomycin 1000mg  q12h. Trough 5/20 2100, hold dose for trough > 20.    Jewel Baize PharmD, BCPS

## 2014-07-25 NOTE — Telephone Encounter (Signed)
Returned phone call to patient's wife [place].  Left message for patient's wife to call the office at her earliest convenience.    May interrupt NN.

## 2014-07-25 NOTE — ED Notes (Signed)
Pt's cefepime still infusing during transfer. Unable to start zithromax at this time.    Alvina Filbert, RN  07/25/14 [phone]

## 2014-07-25 NOTE — Consults (Signed)
Gastroenterology Consult Note      Patient: Andrew [name]  DOB: 01-16-1941  Acct#:      Date:  07/25/2014     History of Present Illness  Patient is a 74 y.o. Caucasian male admitted with PNEUMONIA [[id number]] who is seen in consult for elevated ammonia.   He has cirrhosis due to NASH and is followed by Dr. Einar Grad. Per Dr. 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 No h/o ascites. No h/o esophageal variceal bleeding. He has small esophageal varices seen on EGD 01/2014. He has h/o hepatic encephalopathy and takes Xifaxan BID. Also takes lactulose and has at least 3 BMs daily. He is on lasix and Inspra. He is no longer on aldactone due to gynecomastia. He had workup for anemia: EGD and colonoscopy in Nov 2015 for anemia. Small esophageal varices no gastric varies, non erosive gastritis. Normal colonoscopy.   He was admitted 2 weeks ago with dizziness. We saw pt for HE. He gradually improved with lactulose. [place] then showed no ascites. HE was thought to be precipitated but not taking lactulose enough at home. At that admission he underwent heme eval for pancytopenia which is thought to be from cirrhosis and possible MDS. His wife states that he was having 3-4 BMs daily at home. A couple days ago the diarrhea increased and he was having a BM every hour. She held the lactulose until this cleared and then resumed it. She noted some trouble maintaining skeletal muscle contractions - He has a hard time holding a sandwich when eating. His mental status has seemed ok. He denies any abdominal pain, nausea, vomiting. No melena or hematochezia. He began having a cough yesterday. CXR showed multifocal pneumonia.       Past Medical History   Diagnosis Date   ??? Hypertension    ??? Kidney stones    ??? Alpha 1 antitrypsin deficiency    ??? Thrombocytopenia ([place]) 04/30/2009   ??? BPH (benign prostatic  hyperplasia)    ??? Cirrhosis, nonalcoholic ([place])      Due to Alpha-1 vs NASH   ??? Chronic systolic CHF (congestive heart failure) ([place]) 10/18/2013   ??? CAD (coronary artery disease)      Non-obstructive   ??? Compression fracture of lumbar vertebra ([facility])      multiple, lumbar   ??? Leukopenia    ??? Anemia    ??? Pneumonia    ??? DVT (deep venous thrombosis) ([place])    ??? Cancer ([place])    ??? Hyperlipidemia    ??? Arthritis    ??? Glaucoma    ??? Liver disease    ??? Myelodysplasia (myelodysplastic syndrome) ([place]) 07/19/2014      Past Surgical History   Procedure Laterality Date   ??? Av fistula repair  1970's   ??? Colonoscopy  4/12     5y   ??? Cardiac catheterization     ??? Eye surgery  04/12/2014     Laser Eye Surgery for glaucoma   ??? Cosmetic surgery  04/2013     eye lids   ??? Bone marrow biopsy     ??? Knee surgery        Past Endoscopic History: none    Admission Meds  No current facility-administered medications on file prior to encounter.     Current Outpatient Prescriptions on File Prior to Encounter   Medication Sig Dispense Refill   ???  enoxaparin (LOVENOX) 80 MG/0.8ML injection Inject 0.8 mLs into the skin 2 times daily for 14 days 1 Syringe 3   ??? citalopram (CELEXA) 10 MG tablet Take 1 tablet by mouth daily 30 tablet 3   ??? furosemide (LASIX) 40 MG tablet 80 mg in AM, 40 mg PM 90 tablet 5   ??? carvedilol (COREG) 6.25 MG tablet 3.125 mg in AM and 6.25 mg in PM 60 tablet 3   ??? eplerenone (INSPRA) 50 MG tablet Take 1 tablet by mouth daily 30 tablet 3   ??? lisinopril (PRINIVIL;ZESTRIL) 5 MG tablet Take 1 tablet by mouth nightly 90 tablet 3   ??? magnesium oxide (MAG-OX) 400 (241.3 MG) MG TABS tablet TAKE ONE TABLET BY MOUTH TWICE DAILY 60 tablet 5   ??? traMADol (ULTRAM) 50 MG tablet Take 1 tablet by mouth every 12 hours as needed for Pain 60 tablet 0   ??? lactulose 20 GM/30ML SOLN Take 30 mLs by mouth 2 times daily 5400 mL 1   ??? Cholecalciferol (VITAMIN D3) 1000 UNITS CAPS Take by mouth daily      ??? MILK THISTLE PO Take  by mouth.     ??? rifaximin (XIFAXAN)  550 MG tablet Take 550 mg by mouth 2 times daily.       ??? Multiple Vitamins-Minerals (CENTRUM SILVER PO) Take  by mouth daily.           Allergies  Allergies   Allergen Reactions   ??? Spironolactone Other (See Comments)     gynecomastia      Social   History   Substance Use Topics   ??? Smoking status: Never Smoker    ??? Smokeless tobacco: Never Used      Comment: Passive smoke exposure: yes   ??? Alcohol Use: No        Family History   Problem Relation Age of Onset   ??? Diabetes Mother    ??? Hypertension Mother    ??? Diabetes Sister    ??? Diabetes Brother       Review of Systems  Constitutional: negative for fevers, chills, sweats    Ears, nose, mouth, throat, and face: negative for nasal congestion and sore throat   Respiratory: negative for shortness of breath + cough  Cardiovascular: negative for chest pain and dyspnea   Gastrointestinal: see hpi   Genitourinary:negative for dysuria and frequency   Integument/breast: negative for pruritus and rash   Hematologic/lymphatic: negative for bleeding and easy bruising   Musculoskeletal:negative for arthralgias and myalgias   Neurological: negative for dizziness and weakness       Physical Exam  Blood pressure 95/58, pulse 66, temperature 97.8 ??F (36.6 ??C), temperature source Temporal, resp. rate 18, height _0  (1.651 m), weight 171 lb (77.565 kg), SpO2 96 %.    General appearance: alert, cooperative, no distress, appears stated age  Eyes: Anicteric  Head: Normocephalic, without obvious abnormality  Lungs: crackles and rhonichi, Normal Effort  Heart: regular rate and rhythm, normal S1 and S2, no murmurs or rubs  Abdomen: soft, non-tender. Bowel sounds normal. No masses,  no organomegaly.   Extremities: atraumatic, no cyanosis or edema  Skin: warm and dry, no jaundice  Neuro: Grossly intact, A&OX3, subtle asterixis  Musculoskeletal: 5/5 grip strength BUE      Data Review:    Recent Labs      07/25/14   0806   WBC  2.5*   HGB  8.1*   HCT  24.5*   MCV  111.7*   PLT  73*     Recent  Labs      07/24/14   1555  07/25/14   0835   NA  134*  132*   K  4.6  4.3   CL  103  102   CO2  24  22   BUN  22*  22*   CREATININE  1.0  1.0     Recent Labs      07/25/14   0835   AST  27   ALT  18   BILITOT  1.0   ALKPHOS  100     Recent Labs      07/25/14   0835   LIPASE  12.0*     Recent Labs      07/25/14   0806   PROTIME  15.7*   INR  1.37*     No results for input(s): PTT in the last 72 hours.  No results for input(s): OCCULTBLD in the last 72 hours.    Imaging Studies:                                Assessment:     Active Problems:    * No active hospital problems. *    Cirrhosis - secondary to NASH. Followed by Dr. Einar Grad. No signs of GI bleeding. No ascites on [place] 2 weeks ago. Ammonia level is normal but he has subtle asterixis on exam. This is likely d/t the pneumonia. Has been compliant with lactulose at home.   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 Followed by Dr. Al Corpus.   Pneumonia     Recommendations:   - lactulose TID and titrate frequency to ensure pt is having at least 3 BMs daily  - continue xifaxan BID    Discussed with Dr. Katherina Mires, [place]    I have personally performed a face to face diagnostic evaluation on this patient.  I have interviewed and examined the patient and I agree with the findings and recommended plan of care.  In summary, my findings and plan are the following: mentating and movements back toward baseline per wife, abd soft, only 2 bm today, possibel pneumonia trigger mental status change, on antbx, on xifaxan, incrase lactuelose to q 6 hr since only 2 bm today.       Geannie Risen, MD  [place] [facility] [place]

## 2014-07-25 NOTE — Plan of Care (Signed)
Problem: Falls - Risk of  Intervention: Fall risk assessment  High fall risk  Intervention: Fall precautions  Precautions in place  Intervention: Toileting assistance  Assist up to bedside commode. Gait unsteady.     Goal: Absence of falls  Outcome: Ongoing

## 2014-07-25 NOTE — ED Provider Notes (Signed)
[facility]  eMERGENCY dEPARTMENT eNCOUnter        Pt Name: Andrew [name]  MRN: [id number]  [place] [birth year]  Date of evaluation: 07/25/2014  Provider: Girard Cooter, MD  PCP: Alecia Lemming, [place]       Chief Complaint   Patient presents with   ??? Extremity Weakness     IN [facility] FROM HOME WITH GEN [facility]. UNABLE TO GET AROUND AT HOME. BP 80/45       HISTORY OF PRESENT ILLNESS   (Location/Symptom, Timing/Onset, Context/Setting, Quality, Duration, Modifying Factors, Severity)  Note limiting factors.     Andrew [name] is a 74 y.o. male  presents to the emergency department accompanied by family for evaluation of generalized weakness.  The patient is awake and alert, but very slow to respond to questions, and most of the history is provided by family, mainly his wife.  They report gradual onset of generalized weakness over the past week.  Patient was actually discharged from the hospital approximately one week ago after a one-week stay in the hospital for which he was evaluated for hepatic encephalopathy and was newly diagnosed with myelodysplastic syndrome.  They report that he has been compliant with all medications this week, to include his lactulose 4 times a day.  He has been eating reasonably well.  He has been falling frequently due to his generalized weakness.  They brought him in this morning because he was weak to the point where he was unable to walk or get out of bed.  He has not passed out.  No injuries from his falls.  No fevers, chills, or malaise.  No nausea or vomiting.  No urinary symptoms.  No other voiced complaints.  After review of chest x-ray results with the family, his wife does mention that she noticed him developed a nonproductive cough yesterday.  He did see his primary care physician yesterday.    Nursing Notes were all reviewed and agreed with or any disagreements were addressed  in the HPI.    REVIEW OF SYSTEMS    (2-9 systems for  level 4, 10 or more for level 5)     Review of Systems    Except as noted above in the ROS, all other systems were reviewed and negative.       PAST MEDICAL HISTORY     Past Medical History   Diagnosis Date   ??? Hypertension    ??? Kidney stones    ??? Alpha 1 antitrypsin deficiency    ??? Thrombocytopenia ([place]) 04/30/2009   ??? BPH (benign prostatic hyperplasia)    ??? 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 Chronic systolic CHF (congestive heart failure) ([place]) 10/18/2013   ??? CAD (coronary artery disease)      Non-obstructive   ??? Compression fracture of lumbar vertebra (H[facility])      multiple, lumbar   ??? Leukopenia    ??? Anemia    ??? Pneumonia    ??? DVT (deep venous thrombosis) ([place])    ??? Cancer ([place])    ??? Hyperlipidemia    ??? Arthritis    ??? Glaucoma    ??? Liver disease    ??? Myelodysplasia (myelodysplastic syndrome) ([place]) 07/19/2014         SURGICAL HISTORY       Past Surgical History   Procedure Laterality Date   ??? Av fistula repair  1970's   ??? Colonoscopy  4/12     5y   ??? Cardiac catheterization     ??? Eye surgery  04/12/2014     Laser Eye Surgery for glaucoma   ??? Cosmetic surgery  04/2013     eye lids   ??? Bone marrow biopsy     ??? Knee surgery           CURRENT MEDICATIONS       Current Discharge Medication List      CONTINUE these medications which have NOT CHANGED    Details   enoxaparin (LOVENOX) 80 MG/0.8ML injection Inject 0.8 mLs into the skin 2 times daily for 14 days  Qty: 1 Syringe, Refills: 3      citalopram (CELEXA) 10 MG tablet Take 1 tablet by mouth daily  Qty: 30 tablet, Refills: 3      furosemide (LASIX) 40 MG tablet 80 mg in AM, 40 mg PM  Qty: 90 tablet, Refills: 5      carvedilol (COREG) 6.25 MG tablet 3.125 mg in AM and 6.25 mg in PM  Qty: 60 tablet, Refills: 3      eplerenone (INSPRA) 50 MG tablet Take 1 tablet by mouth daily  Qty: 30 tablet, Refills: 3      lisinopril (PRINIVIL;ZESTRIL) 5 MG tablet Take 1 tablet by mouth nightly  Qty: 90 tablet, Refills: 3      magnesium oxide (MAG-OX) 400 (241.3  MG) MG TABS tablet TAKE ONE TABLET BY MOUTH TWICE DAILY  Qty: 60 tablet, Refills: 5      traMADol (ULTRAM) 50 MG tablet Take 1 tablet by mouth every 12 hours as needed for Pain  Qty: 60 tablet, Refills: 0      lactulose 20 GM/30ML SOLN Take 30 mLs by mouth 2 times daily  Qty: 5400 mL, Refills: 1      Cholecalciferol (VITAMIN D3) 1000 UNITS CAPS Take by mouth daily       MILK THISTLE PO Take  by mouth.      rifaximin (XIFAXAN) 550 MG tablet Take 550 mg by mouth 2 times daily.        Multiple Vitamins-Minerals (CENTRUM SILVER PO) Take  by mouth daily.             ALLERGIES     Spironolactone    FAMILY HISTORY       Family History   Problem Relation Age of Onset   ??? Diabetes Mother    ??? Hypertension Mother    ??? Diabetes Sister    ??? Diabetes Brother           SOCIAL HISTORY       History     Social History   ??? Marital Status: Married     Spouse Name: N/A     Number of Children: N/A   ??? Years of Education: N/A     Social History Main Topics   ??? Smoking status: Never Smoker    ??? Smokeless tobacco: Never Used      Comment: Passive smoke exposure: yes   ??? Alcohol Use: No   ??? Drug Use: No   ??? Sexual Activity: None     Other Topics Concern   ??? None     Social History Narrative       SCREENINGS             PHYSICAL EXAM    (up to 7 for level 4, 8 or more for level 5)  ED Triage Vitals   BP Temp Temp Source Pulse Resp SpO2 Height Weight   07/25/14 0752 07/25/14 0752 07/25/14 0752 07/25/14 0752 07/25/14 0752 07/25/14 0752 07/25/14 0752 07/25/14 0752   80/45 mmHg 96.8 ??F (36 ??C) Oral 60 19 95 % $Re'5\' 5"'kHJ$  (1.651 m) 171 lb (77.565 kg)       Physical Exam   Constitutional: He is oriented to person, place, and time. He appears well-developed and well-nourished. No distress.   HENT:   Head: Normocephalic and atraumatic.   Eyes: Conjunctivae and EOM are normal. Pupils are equal, round, and reactive to light. No scleral icterus.   Neck: Normal range of motion. Neck supple.   Cardiovascular: Normal rate, regular rhythm, normal heart sounds  and intact distal pulses.  Exam reveals no gallop and no friction rub.    No murmur heard.  Pulmonary/Chest: Effort normal. No respiratory distress. He has decreased breath sounds. He has no wheezes. He has no rales.   Abdominal: Soft. Bowel sounds are normal. He exhibits no distension. There is no tenderness. There is no rebound and no guarding.   Musculoskeletal: Normal range of motion. He exhibits no edema or tenderness.   Neurological: He is alert and oriented to person, place, and time. He has normal reflexes. He displays normal reflexes. No cranial nerve deficit. He exhibits normal muscle tone. Coordination normal.   Skin: Skin is warm and dry. He is not diaphoretic.   Psychiatric: He has a normal mood and affect. Judgment and thought content normal. His speech is delayed. He is slowed. Cognition and memory are normal.   Nursing note and vitals reviewed.      DIAGNOSTIC RESULTS   LABS:    Labs Reviewed   CBC WITH AUTO DIFFERENTIAL - Abnormal; Notable for the following:     WBC 2.5 (*)     RBC 2.19 (*)     Hemoglobin 8.1 (*)     Hematocrit 24.5 (*)     MCV 111.7 (*)     MCH 37.1 (*)     RDW 26.0 (*)     Platelets 73 (*)     Neutrophils Absolute 0.5 (*)     Smudge Cells Present (*)     Anisocytosis 2+ (*)     Macrocytes 2+ (*)     Polychromasia 1+ (*)     All other components within normal limits    Narrative:     CALL  [facility]  [facility] tel. [phone],  Hematology results called to and read back by RN Kerri Perches, 07/25/2014  08:54, by [facility]   PROTIME-INR - Abnormal; Notable for the following:     Protime 15.7 (*)     INR 1.37 (*)     All other components within normal limits   APTT - Abnormal; Notable for the following:     aPTT 46.5 (*)     All other components within normal limits   COMPREHENSIVE METABOLIC PANEL - Abnormal; Notable for the following:     Sodium 132 (*)     Glucose 108 (*)     BUN 22 (*)     Total Protein 6.0 (*)     Alb 1.9 (*)     Albumin/Globulin Ratio 0.5 (*)     All other components within  normal limits   BRAIN NATRIURETIC PEPTIDE - Abnormal; Notable for the following:     Pro-BNP 196 (*)     All other components within normal limits   LIPASE - Abnormal;  Notable for the following:     Lipase 12.0 (*)     All other components within normal limits   CULTURE BLOOD #1   CULTURE BLOOD #2   GRAM STAIN   AMMONIA   LACTIC ACID, PLASMA   TROPONIN   URINE RT REFLEX TO CULTURE       All other labs were within normal range or not returned as of this dictation.    EKG: All EKG's are interpreted by the Emergency Department Physician who either signs or Co-signs this chart in the absence of a cardiologist.    EKG by my interpretation demonstrates normal sinus rhythm at 66 bpm with occasional premature supraventricular complexes and premature ventricular complexes.  Minimal LVH.  Old anterior infarct.  No acute ST elevations or T-wave inversions.  Normal axis, normal intervals.  Compared with prior EKG from Jul 07, 2014, premature supraventricular complexes and PVCs are new.    RADIOLOGY:   Non-plain film images such as CT, Ultrasound and MRI are read by the radiologist. Plain radiographic images are visualized and preliminarily interpreted by the  ED Provider with the below findings:    Interpretation per the Radiologist below, if available at the time of this note:    XR Chest Standard TWO VW   Final Result   IMPRESSION:   New bilateral multifocal airspace disease, most likely atelectasis or   pneumonia although follow-up to ensure resolution is recommended.      New small to moderate size left pleural effusion.         XR Chest Standard TWO VW    (Results Pending)         PROCEDURES   Unless otherwise noted below, none     Procedures    CRITICAL CARE TIME   N/A    CONSULTS:  IP CONSULT TO INTERNAL MEDICINE    EMERGENCY DEPARTMENT COURSE and DIFFERENTIAL DIAGNOSIS/MDM:   Vitals:    Filed Vitals:    07/25/14 1031 07/25/14 1047 07/25/14 1101 07/25/14 1200   BP: _0 95/58   Pulse: 73 67 65 66   Temp:     97.8 ??F (36.6 ??C)   TempSrc:    Temporal   Resp: _1 Height:       Weight:       SpO2: 94% 95% 94% 96%       Patient was given the following medications:  Medications   0.9 % sodium chloride bolus (0 mLs Intravenous Stopped 07/25/14 0948)   vancomycin (VANCOCIN) 1000 mg in dextrose 5% 200 mL IVPB (0 mg Intravenous Stopped 07/25/14 1041)   cefepime (MAXIPIME) 2 g in dextrose 5 % 100 mL IVPB (0 g Intravenous Stopped 07/25/14 1111)       Patient seen and evaluated for his complaints.  Noted to be hypotensive upon arrival.  IV fluids given upon arrival while workup was initiated, which included extensive laboratory evaluation due to the patient's recent history of hepatic encephalopathy and current hypotension.  CBC, CMP, lipase, blood cultures, lactic acid, and pneumonia were ordered in addition to urinalysis and chest x-ray.  ED workup significant for persistent pancytopenia consistent with his recent diagnosis of myelodysplastic syndrome.  Chest x-ray demonstrated new bilateral multifocal airspace disease.  Differential including atelectasis or pneumonia suggested.  Recommendation for follow-up to resolution.  Patient is satting in the mid 81s.  No fever.  Slight cough reported by patient's wife.  Based on the patient's pancytopenia and chest x-ray  findings, patient was started on antibiotics ED, with HCAP coverage based on his recent hospitalization.  Patient's blood pressure did improve with IV fluids.  No signs of hyperammonemia.  Lactic acid normal.  Case was discussed with Dr. Tasia Catchings, and he accepted patient for admission and further management as indicated.    The patient tolerated their visit well.   The patient and / or the family were informed of the results of any tests, a time was given to answer questions.    FINAL IMPRESSION      1. Pneumonia of both lungs due to infectious organism, unspecified part of lung    2. Generalized weakness    3. Hypotension, unspecified hypotension type           DISPOSITION/PLAN   DISPOSITION Admitted    PATIENT REFERRED TO:  Alecia Lemming, DO  [place] [address]  [place], [place] [address]  [phone]            DISCHARGE MEDICATIONS:  Current Discharge Medication List          DISCONTINUED MEDICATIONS:  Current Discharge Medication List                 (Please note that portions of this note were completed with a voice recognition program.  Efforts were made to edit the dictations but occasionally words are mis-transcribed.)    Girard Cooter, MD (electronically signed)          Girard Cooter, MD  07/25/14 [phone]

## 2014-07-25 NOTE — Telephone Encounter (Signed)
[name] called because Patient has been admitted to the hospital for reasons related to his blood disorder, and has an appointment tomorrow with Provider. Can the Provider still see the patient?Andrew [name]

## 2014-07-25 NOTE — Progress Notes (Signed)
To room 3379 via cart from ED oriented to room and call light system family at bedside Call light in reach will continue to monitor

## 2014-07-26 ENCOUNTER — Encounter: Attending: Hematology & Oncology | Primary: Internal Medicine

## 2014-07-26 ENCOUNTER — Inpatient Hospital Stay: Admit: 2014-07-26 | Payer: MEDICARE | Primary: Internal Medicine

## 2014-07-26 ENCOUNTER — Encounter: Primary: Internal Medicine

## 2014-07-26 ENCOUNTER — Encounter: Attending: Critical Care Medicine | Primary: Internal Medicine

## 2014-07-26 ENCOUNTER — Inpatient Hospital Stay: Primary: Internal Medicine

## 2014-07-26 DIAGNOSIS — I10 Essential (primary) hypertension: Secondary | ICD-10-CM | POA: Insufficient documentation

## 2014-07-26 LAB — URINALYSIS WITH REFLEX TO CULTURE
Bilirubin Urine: NEGATIVE
Blood, Urine: NEGATIVE
Glucose, Ur: NEGATIVE mg/dL
Ketones, Urine: NEGATIVE mg/dL
Leukocyte Esterase, Urine: NEGATIVE
Nitrite, Urine: NEGATIVE
Protein, UA: NEGATIVE mg/dL
Specific Gravity, UA: 1.013 (ref 1.005–1.030)
Urobilinogen, Urine: 0.2 E.U./dL (ref <2.0)
pH, UA: 5 (ref 5.0–8.0)

## 2014-07-26 LAB — COMPREHENSIVE METABOLIC PANEL
ALT: 16 U/L (ref 10–40)
AST: 23 U/L (ref 15–37)
Albumin/Globulin Ratio: 0.4 — ABNORMAL LOW (ref 1.1–2.2)
Albumin: 1.6 g/dL — ABNORMAL LOW (ref 3.4–5.0)
Alkaline Phosphatase: 88 U/L (ref 40–129)
Anion Gap: 9 (ref 3–16)
BUN: 21 mg/dL — ABNORMAL HIGH (ref 7–20)
CO2: 20 mmol/L — ABNORMAL LOW (ref 21–32)
Calcium: 8.3 mg/dL (ref 8.3–10.6)
Chloride: 105 mmol/L (ref 99–110)
Creatinine: 1.1 mg/dL (ref 0.8–1.3)
GFR African American: >60 (ref >60)
GFR Non-African American: >60 (ref >60)
Globulin: 3.7 g/dL
Glucose: 106 mg/dL — ABNORMAL HIGH (ref 70–99)
Potassium: 4.2 mmol/L (ref 3.5–5.1)
Sodium: 134 mmol/L — ABNORMAL LOW (ref 136–145)
Total Bilirubin: 1 mg/dL (ref 0.0–1.0)
Total Protein: 5.3 g/dL — ABNORMAL LOW (ref 6.4–8.2)

## 2014-07-26 LAB — MICROSCOPIC URINALYSIS
Epithelial Cells, UA: 2 /HPF (ref 0–5)
Hyaline Casts, UA: 6 /HPF (ref 0–8)
RBC, UA: 2 /HPF (ref 0–4)
WBC, UA: 4 /HPF (ref 0–5)

## 2014-07-26 LAB — AFP TUMOR MARKER: AFP-Tumor Marker: 1 ng/mL (ref 0–9)

## 2014-07-26 MED ORDER — IOVERSOL 68 % IV SOLN
68 % | Freq: Once | INTRAVENOUS | Status: DC | PRN
Start: 2014-07-26 — End: 2014-08-06

## 2014-07-26 MED ORDER — IOPAMIDOL 76 % IV SOLN
76 % | Freq: Once | INTRAVENOUS | Status: DC | PRN
Start: 2014-07-26 — End: 2014-07-26

## 2014-07-26 MED ORDER — SODIUM CHLORIDE 0.9 % IJ SOLN
0.9 % | INTRAMUSCULAR | Status: DC | PRN
Start: 2014-07-26 — End: 2014-08-06

## 2014-07-26 MED FILL — ZOSYN 3-0.375 GM/50ML IV SOLN: INTRAVENOUS | Qty: 50

## 2014-07-26 MED FILL — OPTIRAY 320 68 % IV SOLN: 68 % | INTRAVENOUS | Qty: 100

## 2014-07-26 MED FILL — LOVENOX 80 MG/0.8ML SC SOLN: 80 MG/0.8ML | SUBCUTANEOUS | Qty: 0.8

## 2014-07-26 MED FILL — CARVEDILOL 6.25 MG PO TABS: 6.25 MG | ORAL | Qty: 1

## 2014-07-26 MED FILL — VANCOMYCIN HCL IN DEXTROSE 1-5 GM/200ML-% IV SOLN: 1-5 GM/200ML-% | INTRAVENOUS | Qty: 200

## 2014-07-26 MED FILL — CITALOPRAM HYDROBROMIDE 20 MG PO TABS: 20 MG | ORAL | Qty: 1

## 2014-07-26 MED FILL — ISOVUE-370 76 % IV SOLN: 76 % | INTRAVENOUS | Qty: 100

## 2014-07-26 MED FILL — CIPRO IN D5W 400 MG/200ML IV SOLN: 400 MG/200ML | INTRAVENOUS | Qty: 200

## 2014-07-26 MED FILL — LACTULOSE 10 GM/15ML PO SOLN: 10 GM/15ML | ORAL | Qty: 30

## 2014-07-26 MED FILL — FUROSEMIDE 40 MG PO TABS: 40 MG | ORAL | Qty: 1

## 2014-07-26 MED FILL — XIFAXAN 550 MG PO TABS: 550 MG | ORAL | Qty: 1

## 2014-07-26 MED FILL — LISINOPRIL 5 MG PO TABS: 5 MG | ORAL | Qty: 1

## 2014-07-26 MED FILL — VITAMIN D3 25 MCG (1000 UT) PO TABS: 25 MCG (1000 UT) | ORAL | Qty: 1

## 2014-07-26 MED FILL — SODIUM CHLORIDE 0.9 % IV SOLN: 0.9 % | INTRAVENOUS | Qty: 1000

## 2014-07-26 MED FILL — EPLERENONE 25 MG PO TABS: 25 MG | ORAL | Qty: 2

## 2014-07-26 MED FILL — MAGNESIUM OXIDE 400 (241.3 MG) MG PO TABS: 400 (241.3 Mg) MG | ORAL | Qty: 1

## 2014-07-26 NOTE — Progress Notes (Signed)
Physical Therapy  Facility/Department: [facility]  Initial Assessment    NAME: Andrew [name]  DOB: 1940/10/16  MRN: [id number]    Date of Service: 07/26/2014    Patient Diagnosis(es):   Patient Active Problem List    Diagnosis Date Noted   ??? Erectile dysfunction 04/30/2009     Priority: Low   ??? Pneumonia 07/25/2014   ??? Myelodysplasia (myelodysplastic syndrome) ([place]) 07/19/2014   ??? DVT (deep venous thrombosis) ([place]) 07/10/2014   ??? Hepatic encephalopathy ([place]) 07/07/2014   ??? Ataxia 07/07/2014   ??? Pancytopenia ([place]) 07/07/2014   ??? Benign essential HTN 07/03/2014   ??? Bronchiectasis without complication ([place]) 69/48/5462   ??? 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 Anemia 04/02/2014   ??? Hemispheric carotid artery syndrome    ??? CAD in native artery    ??? Hyperlipidemia    ??? Abnormal EKG 04/01/2014   ??? Visual changes 04/01/2014   ??? Lumbar compression fracture ([place]) 01/14/2014   ??? Chronic low back pain 01/14/2014   ??? Osteopenia 01/14/2014   ??? Coagulopathy ([place]) 01/14/2014   ??? 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 Chronic systolic CHF (congestive heart failure) ([place]) 10/18/2013   ??? Mild CAD    ??? Cirrhosis, nonalcoholic ([place])    ??? 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 BPH (benign prostatic hyperplasia) 08/01/2009   ??? Thrombocytopenia 04/30/2009   ??? Edema 04/30/2009       Past Medical History   Diagnosis Date   ??? Hypertension    ??? Kidney stones    ??? Alpha 1 antitrypsin deficiency    ??? Thrombocytopenia ([place]) 04/30/2009   ??? BPH (benign prostatic hyperplasia)    ??? 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 Chronic systolic CHF (congestive heart failure) ([place]) 10/18/2013   ??? CAD (coronary artery disease)      Non-obstructive   ??? Compression fracture of lumbar vertebra (HCC)      multiple, lumbar   ??? Leukopenia    ??? Anemia    ??? Pneumonia    ??? DVT (deep venous thrombosis) ([place])    ??? Cancer ([place])    ??? Hyperlipidemia    ??? Arthritis    ??? Glaucoma     ??? Liver disease    ??? Myelodysplasia (myelodysplastic syndrome) ([place]) 07/19/2014     Past Surgical History   Procedure Laterality Date   ??? Av fistula repair  1970's   ??? Colonoscopy  4/12     5y   ??? Cardiac catheterization     ??? Eye surgery  04/12/2014     Laser Eye Surgery for glaucoma   ??? Cosmetic surgery  04/2013     eye lids   ??? Bone marrow biopsy     ??? Knee surgery         Restrictions  Restrictions/Precautions  Restrictions/Precautions: Fall Risk (high fall risk)  Position Activity Restriction  Other position/activity restrictions: up as tolerated   Vision/Hearing  Vision: Within Functional Limits (glaucoma)  Hearing: Within functional limits     Subjective  General  Chart Reviewed: Yes  Additional Pertinent Hx: Myelodysplasia, HTN, cirrhosis, chronic systolic CHF, CAD, cancer  Response To Previous Treatment: Not applicable  Family / Caregiver Present: Yes (wife)  Diagnosis: pneumonia  Follows Commands: Within Functional Limits  General Comment  Comments: Pt in sleep state, easily awoken; agreeable to PT evaluation.   Subjective  Subjective: Pt supine in bed upon arrival.   Pain Screening  Patient Currently in Pain: Denies  Vital Signs  Patient Currently in Pain: Denies       Orientation  Orientation  Overall Orientation Status: Within Functional Limits (increased lethargy )  Social/Functional History  Social/Functional History  Lives With: Spouse  Type of Home: House  Home Layout: Two level  Home Access: Stairs to enter with rails (1 step to enter)  Bathroom Shower/Tub: Tub/Shower unit (renovating into a walk-in shower)  Bathroom Toilet: Standard  Bathroom Equipment: Press photographer Responsibilities: No  Active Driver: Yes  Occupation: Retired  Additional Comments: Patient was just admitted here 07/07/14-- went home with home care. Wife reports therapy never came. Wife stated patient did very well for @ 1 week and able to perform ADLs and ambulation. However became weak over last few days and  was  readmitted.l  Objective  RLE (degrees)  RLE ROM: WFL;Active  ROM LLE (degrees)  LLE ROM: WFL;Active  Strength RLE  Comment: grossly 4/5 through major muscle groups in BLE evidenced via transfers and functional mobility   Strength LLE  Comment: grossly 4/5 through major muscle groups in BLE evidenced via transfers and functional mobility   Tone RLE  RLE Tone: Normotonic  Tone LLE  LLE Tone: Normotonic  Motor Control  Gross Motor?: WNL  Sensation  Overall Sensation Status:  (did not formally assess this date)  Bed Mobility  Supine to Sit: Stand by assistance (increased time with HOB elevated)  Sit to Supine: Contact guard assistance  Scooting: Contact guard assistance  Transfers  Sit to Stand: Minimal Assistance  Stand to sit: Minimal Assistance  Ambulation  Ambulation?: Yes  Ambulation 1  Surface: level tile  Device: Hand-Held Assist (bilateral hand held assist to promote upright posture )  Assistance: Dependent/Total (minA x2)  Quality of Gait: Pt presenting with shuffling, steps, decreased step length bilaterally, decreased endurance, forward flexed posture, external rotation at RLE, and decreased gait speed; no loss of balance. Frequent verbal cueing provided for upright posture and to increased step/stride length.   Distance: 115'   Stairs/Curb  Stairs?: No     Balance  Posture: Fair (forward flexed posture, rounded shoulders, forward head)  Sitting - Static: Good  Sitting - Dynamic: Good  Standing - Static: Fair (minA)  Standing - Dynamic: Fair (minA x2 without use of AD)        Assessment   Assessment: Decreased functional mobility ;Decreased strength;Decreased safe judgement ;Decreased endurance;Decreased balance  Assessment: Pt currently not at baseline functioning related to recent dx and would benefit from skilled PT intervention to address stated deficits and facilitate return to PLOF. Pt currently not safe to discharge home and is at an increased risk of falls. Recommend SNF upon discharge to progress PT  POC.   Treatment Diagnosis: decreased functional mobility, decreased strength, decreased safe judgement, decreased endurance, decreased balance   Prognosis: Fair  Requires PT Follow Up: Yes  Total Treatment Time: 15  Activity Tolerance  Activity Tolerance: Patient limited by fatigue  PT D/C Equipment  Equipment Needed:  (TBD at next level of care)     Discharge Recommendations:  [place], Patient would benefit from additional therapy      Plan   Plan  Times per week: 2-5  Times per day: Daily  Current Treatment Recommendations: Strengthening;Balance Training;Functional Mobility Training;Endurance Training;Gait Heritage manager;Safety Education & Training;Patient/Caregiver Education & Training  Patient Education: Educated pt on role of  PT, POC, and discharge recommendations.   Safety Devices  Safety Devices in place: Yes  Type of devices: All fall risk precautions in place;Bed alarm in place;Call light within reach;Gait belt;Patient at risk for falls;Left in bed;Nurse notified  Restraints  Initially in place: No    G-Code  PT G-Codes  Functional Assessment Tool Used: PT evaluation   Functional Limitation: Mobility: Walking and moving around  Mobility: Walking and Moving Around Current Status ([id number]): At least 40 percent but less than 60 percent impaired, limited or restricted  Mobility: Walking and Moving Around Goal Status [phone]): At least 1 percent but less than 20 percent impaired, limited or restricted    Goals  Short term goals  Time Frame for Short term goals: to be met prior to discharge   Short term goal 1: Pt to complete all bed mobility with MOD I.   Short term goal 2: Pt to complete all transfers with MOD I.   Short term goal 3: Pt ambulate 150' with use of least restrictive assistive device and SUPV.   Short term goal 4: Pt to negotiate 1 flight of stairs with handrails and CGA.        Genene Churn, PT  License and Documentation Cosign  Therapy  License Number: Genene Churn PT, [place] [id number]

## 2014-07-26 NOTE — Consults (Signed)
Palliative Care:      74 year old presents to hospital with generalized weakness. Patient discharged from the hospital approximately one week ago after a one-week stay in the hospital for which he was evaluated for hepatic encephalopathy and was newly diagnosed with myelodysplastic syndrome. He is on lactulose four times daily at home. CXR reveals pneumonia. Patient was started on antibiotics. Patient has a history of cirrhosis due to NASH and is followed by Dr. Einar Grad  Patient is followed by Dr Al Corpus for MDS. Patient was to start Vidaza on 07/29/14, now with plans to start on 6/6. Patient does have acute leg DVT and is on Lovenox. Patient is on lactulose and xifaxan. GI and oncology are following. Patient is followed by PCP Dr Susie Cassette.    07/08/2014 EXAMINATION: COMPLETE ABDOMINAL ULTRASOUND    FINDINGS: Study is limited by body habitus. LIVER: Evaluation of the liver is limited. There is diffuse increased echogenicity with no focal lesion noted. Somewhat nodular contour of the liver is compatible with known cirrhosis. BILIARY SYSTEM: Limited imaging of the gallbladder is grossly unremarkable. Common bile duct is within normal limits measuring 6 mm. KIDNEYS: Right kidney is nonvisualized. Left kidney measures 10.1 x 4.9 x 5.3 cm and is grossly unremarkable. PANCREAS: Pancreas is nonvisualized. SPLEEN: Spleen measures 10.1 cm. 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 IVC: The IVC is patent. AORTA: Aorta is obscured. OTHER: No evidence of ascites.     07/08/2014 IMPRESSION: Limited study as above. Cirrhosis and portal hypertension. No evidence of ascites.     Past Medical History:     Hypertension; Kidney stones; Alpha 1 antitrypsin deficiency; Thrombocytopenia ([place]); BPH (benign prostatic hyperplasia); Cirrhosis, nonalcoholic ([place]); Chronic systolic CHF (congestive heart failure) ([place]); CAD (coronary artery disease); Compression fracture of lumbar vertebra ([place]); Leukopenia; Anemia; Pneumonia; DVT (deep venous thrombosis) ([place]); Cancer  ([place]); and Hyperlipidemia.      Advance Directives:      Code status is full    Problem Severity: Pain/Other Symptoms:   Patient denies pain       Bed Mobility/Toileting/Transfer:      Wife assists with meal preparation, homemaking.    Performance Status:      Patient is weak and deconditioned and has been having falls at home.    Symptom Assessment: Appetite/Nausea/Bowels/Fatigue:      Patient has fatigue. States he has a good appetite.    Psychological/Spiritual:    Patient is married and lives with his wife. He has a son and two daughters.      Family Discussion:      Met with patient and his wife. He is lethargic at this time. Wife answering most of questions. Discussed multiple comorbidities and new diagnosis of MDS. She was given patient information on MDS and Vidaza. Vidaza to start on 6/616 with Dr Al Corpus. Wife states patient was "completely independent" up until 4 days prior to admission. Reports he has been followed by Dr Einar Grad for "years" and NASH has been stable. Wife states pateint's recent hospital admission and new pneumonia "caught [place] all by surprise." Patient has been taking lactulose as prescribed and follows by with MDs, routine EGDs and colonoscopy. Discussed PT evaluation and recommendation for ECF, patient is not safe to return home. Patient nods that is willing to consider ECF for short stay rehab. Wife is requesting referral [facility]--discussed with Academic librarian. Patient too lethargic to have meaningful discussion at this time regarding goals of care, advanced care planning. Will see on Monday. Support given.

## 2014-07-26 NOTE — Care Coordination-Inpatient (Signed)
Per Palliative care, a referral was faxed to [facility], [phone].  MD, Please complete & sign the e-COC under the discharge instructions, AVS/Prescriptions, and or medical necessity note for assistance with discharge planning, Thank you, Sofie Hartigan, MSW, [phone]

## 2014-07-26 NOTE — Care Coordination-Inpatient (Signed)
Per request of Dr Susie Cassette, PCP, call to MSW at [facility] re patient.   Dr Susie Cassette would like a palliative care consult while patient is IP  Spoke with Sofie Hartigan, MSW at [phone] ,she will ask his nurse to order a palliative care consult  Provided [facility]  name and cell number [phone] for questions  Admitted under Dr Tasia Catchings [phone],       Dr Susie Cassette saw him in the office 5/18, with the following Plan per his notes:    Plan:  1. Dietician--Discuss with Dr. Einar Grad regarding diet for Cirrhosis and hepatic encephalopathy  2. Needs Home health  3. NH3, BMP today  4. Continue Lactulose, but may need to taper back.   5. We discussed palliative and comfort care and even Hospice care. Pt not in favor just yet.

## 2014-07-26 NOTE — Care Coordination-Inpatient (Signed)
Received a call from [facility], [place], [phone] requesting a Palliative consult per Dr Susie Cassette.  RN notified, and called palliative care, [facility].

## 2014-07-26 NOTE — Plan of Care (Signed)
Problem: SAFETY  Goal: Free from accidental physical injury  Outcome: Ongoing  Intervention: ASSESS FOR FALL RISK  High fall risk  Intervention: [place]  Fall precautions in place  Intervention: PROVIDE AND MAINTAIN SAFE ENVIRONMENT  Safety measures in place

## 2014-07-26 NOTE — Plan of Care (Signed)
Problem: Falls - Risk of  Intervention: Fall risk assessment  High fall risk  Intervention: Fall precautions  Fall risk precautions in place  Intervention: Toileting assistance  Assist up to bedside commode with walker and returned to bed.    Goal: Absence of falls  Outcome: Ongoing

## 2014-07-26 NOTE — Progress Notes (Signed)
[facility] FOLLOW-UP:     Primary Oncologist: Dr. Eulis Canner  Date: 07/26/2014    PCP: Alecia Lemming, DO  Referring Provider: No ref. provider found    PROBLEM LIST:     Patient Active Problem List   Diagnosis   ??? Erectile dysfunction   ??? Thrombocytopenia   ??? Edema   ??? BPH (benign prostatic hyperplasia)   ??? Alpha-1-antitrypsin deficiency   ??? Cirrhosis, nonalcoholic ([place])   ??? Mild CAD   ??? Chronic systolic CHF (congestive heart failure) ([place])   ??? Non-ischemic cardiomyopathy ([place])   ??? Lumbar compression fracture ([place])   ??? Chronic low back pain   ??? Osteopenia   ??? Coagulopathy ([place])   ??? Abnormal EKG   ??? Visual changes   ??? Fixed pupil of right eye   ??? Anemia   ??? Hemispheric carotid artery syndrome   ??? CAD in native artery   ??? Hyperlipidemia   ??? 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 Bronchiectasis without complication ([place])   ??? Benign essential HTN   ??? Hepatic encephalopathy (H[facility])   ??? Ataxia   ??? Pancytopenia ([place])   ??? DVT (deep venous thrombosis) ([place])   ??? Myelodysplasia (myelodysplastic syndrome) ([place])   ??? Pneumonia       Specialty Problems        Oncology Problems    Myelodysplasia (myelodysplastic syndrome) (HCC)              INTERVAL HISTORY     Feeling better but weak.     REVIEW OF SYSTEMS:     Weak.  No headache.  No GI symptoms at present.      PHYSICAL EXAM:     BP 94/55 mmHg   Pulse 69   Temp(Src) 98.1 ??F (36.7 ??C) (Temporal)   Resp 16   Ht 5\' 5"  (1.651 m)   Wt 171 lb 14.4 oz (77.973 kg)   BMI 28.61 kg/m2   SpO2 95%    WEIGHT:   Wt Readings from Last 3 Encounters:   07/26/14 171 lb 14.4 oz (77.973 kg)   07/24/14 171 lb (77.565 kg)   07/19/14 173 lb 6.4 oz (78.654 kg)     GENERAL APPEARANCE: alert and cooperative, ill-appearing.    HEAD: Normocephalic, without obvious abnormality, atraumatic  NECK: No palpable lymphadenopathy in supraclavicular or cervical chains  LUNGS: Clear to auscultation bilaterally, no audible rales, wheezes or crackles  HEART: Regular rate and rhythm, S1, S2 normal  ABDOMEN: Soft, somewhat  distended.  EXTREMITIES: without cyanosis, clubbing, or asymmetry. Leg edema.  SKIN: No jaundice, purpura or petechiae    LABS:     CBC:  Lab Results   Component Value Date    WBC 2.5* 07/25/2014    HGB 8.1* 07/25/2014    HCT 24.5* 07/25/2014    MCV 111.7* 07/25/2014    PLT 73* 07/25/2014    LABLYMP 1.6 07/19/2014    MID 0.4 07/19/2014    GRAN 0.8* 07/19/2014    LYMPHOPCT 74.0 07/25/2014    MIDPERCENT 13.4 07/19/2014    GRANULOCYTES 29.6* 07/19/2014    RBC 2.19* 07/25/2014    MCH 37.1* 07/25/2014    MCHC 33.3 07/25/2014    RDW 26.0* 07/25/2014       Lab Results   Component Value Date    NA 134* 07/26/2014    K 4.2 07/26/2014    CL 105 07/26/2014    CO2 20* 07/26/2014    BUN 21* 07/26/2014    CREATININE  1.1 07/26/2014    GLUCOSE 106* 07/26/2014    CALCIUM 8.3 07/26/2014    PROT 5.3* 07/26/2014    LABALBU 1.6* 07/26/2014    BILITOT 1.0 07/26/2014    ALKPHOS 88 07/26/2014    AST 23 07/26/2014    ALT 16 07/26/2014    LABGLOM >60 07/26/2014    GFRAA >60 07/26/2014    AGRATIO 0.4* 07/26/2014    GLOB 3.7 07/26/2014       No results found for: PTINR    TUMOR MARKERS:    Lab Results   Component Value Date    PSA 0.55 08/15/2013         IMAGING:     Xr Chest Standard Two Vw    07/25/2014   EXAMINATION: TWO VIEWS OF THE CHEST  07/25/2014 8:17 am  COMPARISON: Chest x-ray Jul 07, 2014  HISTORY: ORDERING SYSTEM PROVIDED HISTORY: generalized weakness, hypotension TECHNOLOGIST PROVIDED HISTORY: Ordering Physician Provided Reason for Exam: weakness Acuity: Unknown Type of Exam: Unknown  FINDINGS: There is new focal density of the right upper lobe above the hilum.  There also is new faint airspace disease of the lateral right lung base.  There is new focal opacity of the retrocardiac left lower lobe.  There is blunting of the left costophrenic angle consistent with pleural fluid.     07/25/2014   IMPRESSION: New bilateral multifocal airspace disease, most likely atelectasis or pneumonia although follow-up to ensure resolution is  recommended.  New small to moderate size left pleural effusion.     Ct Head Wo Contrast    07/07/2014   EXAMINATION: CT OF THE HEAD WITHOUT CONTRAST  07/07/2014 11:11 pm  TECHNIQUE: CT of the head was performed without the administration of intravenous contrast.  COMPARISON: 04/01/2014  HISTORY: ORDERING SYSTEM PROVIDED HISTORY: dizzy TECHNOLOGIST PROVIDED HISTORY: Ordering Physician Provided Reason for Exam: dizzy  FINDINGS: BRAIN/VENTRICLES: There is no acute intracranial hemorrhage, mass effect or midline shift.  No abnormal extra-axial fluid collection.  The gray-white differentiation is maintained without evidence of an acute infarct.  There is no evidence of hydrocephalus.  ORBITS: The visualized portion of the orbits demonstrate no acute abnormality.  SINUSES: There is minimal mucosal thickening in the left maxillary sinus. The remaining visualized paranasal sinuses are clear.  The mastoid air cells are unremarkable.  SOFT TISSUES/SKULL:  No acute abnormality of the visualized skull or soft tissues.     07/07/2014   IMPRESSION: No acute intracranial abnormality.     US Abdomen Complete    07/08/2014   EXAMINATION: COMPLETE ABDOMINAL ULTRASOUND 07/08/2014 6:04 pm  COMPARISON: None  HISTORY: ORDERING SYSTEM PROVIDED HISTORY: CIRRHOSIS TECHNOLOGIST PROVIDED HISTORY: Ordering Physician Provided Reason for Exam: cirrhosis, r/o ascites Acuity: Acute Type of Exam: Initial  FINDINGS: Study is limited by body habitus.  LIVER: Evaluation of the liver is limited.  There is diffuse increased echogenicity with no focal lesion noted.  Somewhat nodular contour of the liver is compatible with known cirrhosis.  BILIARY SYSTEM: Limited imaging of the gallbladder is grossly unremarkable.  Common bile duct is within normal limits measuring 6 mm.  KIDNEYS: Right kidney is nonvisualized.  Left kidney measures 10.1 x 4.9 x 5.3 cm and is grossly unremarkable.  PANCREAS: Pancreas is nonvisualized.  SPLEEN: Spleen measures 10.1 cm.  Numerous  varices are noted.  IVC: The IVC is patent.  AORTA: Aorta is obscured.  OTHER: No evidence of ascites.     07/08/2014   IMPRESSION: Limited study as above.  Cirrhosis  and portal hypertension.  No evidence of ascites.     Xr Chest Portable    07/08/2014   EXAMINATION: SINGLE VIEW OF THE CHEST  07/07/2014 11:34 pm  COMPARISON: 06/03/2014  HISTORY: ORDERING SYSTEM PROVIDED HISTORY: other TECHNOLOGIST PROVIDED HISTORY: Ordering Physician Provided Reason for Exam: dizziness Acuity: Unknown Type of Exam: Unknown  FINDINGS: Cardiac leads project over the chest.  Diffuse interstitial opacity is demonstrated bilaterally.  Heterogeneous opacity is seen adjacent to the left heart border.  No evidence of pleural effusion.  Negative for pneumothorax. Cardiac and mediastinal silhouettes are similar to prior.  Old posterior left 7th rib fracture.     07/08/2014   IMPRESSION: Mild pulmonary edema.     Vl Extremity Venous Right    07/10/2014   Lower Extremities DVT Study   Demographics    Patient Name      Andrew [name]    Date of Study     07/09/2014          Gender              Male    Patient Number    [id number]          Date of Birth       [birth year]    Visit Number      [id number]         Age                 74 year(s)    Accession Number  [id number]           Room Number         6195    Corporate ID      [id number]            Sonographer         Erasmo Leventhal,                                                            RVS    Ordering          Deniece Portela,   Interpreting        [facility]  Physician         MD                  Physician           Peggyann Shoals,                                                            MD, [facility], [place]   Procedure  Type of Study:    Veins:Lower Extremities DVT Study, VL EXTREMITY VENOUS DUPLEX RIGHT.    Generic Orders:VL EXTREMITY VENOUS DUPLEX RIGHT.    Vascular Sonographer Report   Additional Indications:Swelling  Impressions Right Impression Acute totally occluding deep vein thrombosis  involving the right proximal PTV and one gastroc vein zone 5-6. Acute partially occluding deep vein thrombosis involving the popliteal vein and right one gastroc vein zone 5-6. No other evidence of deep vein or superficial vein thrombosis involving the right lower extremity and the  left common femoral vein. Calf veins were not well visualized on the right due to patient positioning and edema.  Verbal to [employer].  Conclusions    Summary    Acute totally occluding deep vein thrombosis involving the right proximal  PTV and one gastroc vein zone 5-6.  Acute partially occluding deep vein thrombosis involving the popliteal vein  and right one gastroc vein zone 5-6.  Calf veins were not well visualized on the right due to patient positioning  and edema.    Signature    Electronically signed by Peggyann Shoals, MD, [facility], RPVI  (Interpreting physician) on 07/10/2014 at 11:34 AM    Cta Pulmonary With Contrast    06/27/2014   EXAMINATION: CTA OF THE CHEST 06/27/2014 2:53 pm  TECHNIQUE: CTA of the chest was performed after the administration of intravenous contrast.  Multiplanar reformatted images are provided for review.  MIP images are provided for review.  Patient received 100 mL Isovue 370 intravenous contrast.  COMPARISON: CT chest with contrast June 13, 2014.  HISTORY: ORDERING SYSTEM PROVIDED HISTORY: DVT (deep venous thrombosis), unspecified laterality [facility]) TECHNOLOGIST PROVIDED HISTORY: Ordering Physician Provided Reason for Exam: DVT, SOB Injury/Trauma or Illness: Illness/Other Acuity: Acute Type of Exam: Initial Relevant Medica/Surgical History: CHF, cardiomyopathy  FINDINGS: Pulmonary Arteries: Pulmonary arteries are adequately opacified for evaluation.  No evidence of intraluminal filling defect to suggest pulmonary embolism.  Main pulmonary artery is normal in caliber.  Mediastinum: No evidence of mediastinal lymphadenopathy.  Heart is enlarged, similar in appearance.  The heart and pericardium demonstrate no  acute abnormality.  There is no acute abnormality of the thoracic aorta. Nodularity of thyroid similar in appearance.  Lungs/pleura: The central airways are patent.  There is a moderate right-sided pleural effusion which appears slightly decreased compared to prior study.  There is mild patchy dependent opacities in the right upper lobe, similar in appearance.  There are dependent subpleural opacities within the lingula and left lower lobe.  There is mild opacity within the right middle lobe which is linear in appearance in appears slightly improved compared to prior study.  2 nodular appearing densities are again seen in the right lower lobe on image number 65, unchanged.  No pneumothorax.  Upper Abdomen: Cirrhotic liver with mildly distended gallbladder is similar in appearance to prior study.  Soft Tissues/Bones: Multiple compression fractures again seen, most pronounced at the thoracolumbar junction, similar in appearance.  Old right-sided rib fractures are also seen.  No acute bone or soft tissue abnormality.  Gynecomastia is again seen.     06/27/2014   IMPRESSION: No evidence of pulmonary embolism.  Moderate right pleural effusion, minimally decreased.  Persistent patchy areas of airspace opacity which may be related to atelectasis versus pneumonia, slightly improved.  2 nodular appearing densities are seen within the right lower lobe which may be related to the airspace disease or represent lung nodules.  Follow-up recommended with dedicated chest CT in 3 months.  Remote compression fractures again seen.  Cirrhotic liver.     Vl Extremity Venous Bilateral    07/09/2014   Vascular Lower Extremities DVT Study Procedure  -- PRELIMINARY SONOGRAPHER REPORT --    Demographics    Patient Name      Andrew [name]    Date of Study     07/09/2014          Gender              Male    Patient Number  [id number]          Date of Birth       22-Jun-1940    Visit Number      [id number]         Age                 59 year(s)     Accession Number  [id number]           Room Number         3825    Corporate ID      [id number]            Sonographer         Erasmo Leventhal,                                                            RVS    Ordering          Deniece Portela,   Interpreting        [facility]  Physician         MD                  Physician   Procedure  Type of Study:    Veins:Lower Extremities DVT Study, VL EXTREMITY VENOUS DUPLEX RIGHT.   Tech Comments Right Acute totally occluding deep vein thrombosis involving the right proximal PTV and one gastroc vein zone 5-6. Acute partially occluding deep vein thrombosis involving the popliteal vein and right one gastroc vein zone 5-6. No other evidence of deep vein or superficial vein thrombosis involving the right lower extremity and the left common femoral vein. Calf veins were not well visualized on the right due to patient positioning and edema.  verbal to [employer].    Vl Extremity Venous Bilateral    06/27/2014   Lower Extremities DVT Study   Demographics    Patient Name       Andrew [name]    Date of Study      06/27/2014        Gender              Male    Patient Number     [id number]        Date of Birth       1940/04/20    Visit Number       [id number]       Age                 35 year(s)    Accession Number   [id number]         Room Number    Corporate ID       [id number]          Sonographer         Erasmo Leventhal,                                                           [place]    Ordering Physician Lacretia Leigh, MD  Interpreting        [facility]  Physician           Darlyn Chamber MD,                                                           [facility]   Procedure  Type of Study:    Veins:Lower Extremities DVT Study, VASC EXTREMITY VENOUS DUPLEX BILATERAL.    Vascular Sonographer Report   Additional Indications:Swelliing  Impressions Right Impression Acute partially occluding deep vein thrombosis involving the right set of gastroc veins zone  5-6. No other evidence of deep vein or superficial vein thrombosis involving the right lower extremity. Reflux noted in the right FV. Left Impression No evidence of deep vein or superficial vein thrombosis involving the left lower extremity.  Verbal to Dr. Jeb Levering. Patient released.  Conclusions    Summary    -Acute partially occluding deep vein thrombosis involving the right set of  gastroc veins zone 5-6.  -Reflux noted in the right FV.    Signature    ------------------------------------------------------------------  Electronically signed by Darlyn Chamber MD, [facility] (Interpreting  physician) on 06/27/2014 at 01:38 PM  ------------------------------------------------------------------   Patient Status:Routine. [facility] - Vascular Lab. Technical Quality:Adequate visualization.  Velocities are measured in cm/s ; Diameters are measured in mm  Right Lower Extremities DVT Study Measurements Right 2D Measurements Location                !Visualized!Compressibility!Thrombosis!     STAGING:     No matching staging information was found for the patient.    ASSESSMENT AND PLAN:     1.  Hepatic encephalopathy.  2.  Cirrhosis with anti-trypsin deficiency.  3.  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  4.  Acute leg DVT.  On Lovenox.   5.  Pneumonia.     No clear signs of active bleeding.  Discussed with Dr. Tasia Catchings.  Explained to the patient.  Most likely we will start [place] on 6/6.    Eulis Canner, M.D.; M.S.  Medical Oncology/Hematology  Phone: [phone]  Fax: [phone]    [facility]  [address] [place] [address] [place] [address]    [place] [address]    [place] [address]

## 2014-07-26 NOTE — Progress Notes (Signed)
Occupational Therapy   Occupational Therapy Initial Assessment  Date: 07/26/2014   Patient Name: Andrew [name]  MRN: [id number]     DOB: Apr 15, 1940    Treatment Diagnosis: Debility and decreased ADLs following pneumonia      Restrictions  Restrictions/Precautions  Restrictions/Precautions: Fall Risk (high fall risk)  Position Activity Restriction  Other position/activity restrictions: up as tolerated   Vision/Hearing  Vision: Within Functional Limits (glaucoma)  Hearing: Within functional limits     Subjective   General  Chart Reviewed: Yes  Additional Pertinent Hx: HTN, kidney stones, cirrhosis, leukopenia, anemia, h/o DVT, cancer, glaucoma, liver disease, myelodysplasia, bone marrow biopsy  Family / Caregiver Present: Yes  Diagnosis: Pneumonia  Subjective  Subjective: Patient sleeping upon entering room, wife at bedside. Easily awoke and agreeable to therapy.   Pain Assessment  Patient Currently in Pain: Denies  Pain Assessment: 0-10  Wong-Baker Pain Rating: No hurt  Pain Level: 0  Response to Pain Intervention: Patient Satisfied     Social/Functional History  Social/Functional History  Lives With: Spouse  Type of Home: House  Home Layout: Two level  Home Access: Stairs to enter with rails (1 step to enter)  Bathroom Shower/Tub: Tub/Shower unit (renovating into a walk-in shower)  Bathroom Toilet: Standard  Bathroom Equipment: Press photographer Responsibilities: No  Active Driver: Yes  Occupation: Retired  Additional Comments: Patient was just admitted here 07/07/14-- went home with home care. Wife reports therapy never came. Wife stated patient did very well for @ 1 week and able to perform ADLs and ambulation. However became weak over last few days and  was readmitted.  Palliative care consult is pending.      Orientation  Orientation  Overall Orientation Status:  (knew in a hospital but not name of hospital)  Objective   Observation/Palpation  Posture: Fair  Observation: Forward/protracted shoulders,  cues for full upright stance, narrow base of support, small steps  Balance  Sitting Balance: Supervision  Standing Balance: Minimal assistance  Standing Balance  Time: @ 5 minutes  Activity: Ambulation x 115 feet with hand held assist of 2 people  Comment: Multiple cues for upright posture, take larger steps  ADL  LE Dressing: Dependent/Total  Transfer: Dependent/Total  Additional Comments: Supine to sit with SBA with HOB elevated with increased time. Ambulated with hand held assist of 2 people (1 holding onto each hand)-- multiple cues to look up, take larger steps and point toes forward (severe ER on right foot)  Tone RUE  RUE Tone: Normotonic  Tone LUE  LUE Tone: Normotonic  Coordination  Movements Are Fluid And Coordinated: Yes  Functional Activity Tolerance  Functional Activity Tolerance: Tolerates 10 - 20 min exercise with multiple rests  Additional Comments: New dx of pneumonia, wife reports increased weakness  Bed mobility  Supine to Sit: Stand by assistance  Sit to Supine: Stand by assistance     Vision - Basic Assessment  Visual History: Glaucoma  Cognition  Overall Cognitive Status: Impaired  Arousal/Alertness: Delayed responses to stimuli  Attention Span: Difficulty attending to directions  Insights: Decreased awareness of deficits  Additional Comments: Patient very lethargic improved over time.   Perception  Overall Perceptual Status: WFL  Sensation  Overall Sensation Status: WFL (stated no numbness in UEs or LEs. )     ROM  ROM: WFL  LUE Strength  Gross LUE Strength: WFL  RUE Strength  Gross RUE Strength: [facility]  Assessment   Assessment: Decreased functional mobility ;Decreased functional activity tolerance;Decreased ADL status;Decreased cognition  Comments: Patient is not at baseline level following admission due to pneumonia. Palliative care consult pending. Wife reports has 24/7 care at home but reports patient with increased weakness, wife agreeable to SNF  Treatment Diagnosis:  Debility and decreased ADLs following pneumonia  Rehab Potential: Fair;Guarded  Recommendations: SNF consult;Patient would benefit from continued therapy after discharge  Goal Formulation: Patient  Requires OT Follow Up: Yes  Total Treatment Time: 16  Timed Code Treatment Minutes: 0 Minutes  Response to Treatment  Response to Treatment: Patient limited by fatigue;Patient Tolerated treatment well  Safety Devices  Safety Devices in place: Yes  Type of devices: Left in bed;Call light within reach;Nurse notified  Restraints  Initially in place: No  OT D/C Equipment  Equipment Needed: Yes  Other: Recommend a RW and shower chair        Discharge Recommendations:  SNF consult, Patient would benefit from continued therapy after discharge     Plan   Plan  Times per week: 2-5  Times per day: Daily  Patient Education: POC, functional mobility, ADLs.     G-Code  OT G-codes  Functional Limitation: Self care  Self Care Current Status [id number]): At least 40 percent but less than 60 percent impaired, limited or restricted  Self Care Goal Status (K7425): At least 1 percent but less than 20 percent impaired, limited or restricted    Goals  Short term goals  Time Frame for Short term goals: until discharge  Short term goal 1: MIn assist bathing  Short term goal 2: MIn assist dressing  Short term goal 3: CGA funtional mobility with RW or with 1 hand held assist  Short term goal 4: CGA toileting  Long term goals  Time Frame for Long term goals : STGs= LTGs       Sherol Dade, OT  License and Documentation Cosign  Therapy License Number: Astrid Divine. [place], [place]

## 2014-07-26 NOTE — Progress Notes (Addendum)
[facility]  Gastroenterology Progress Note    Andrew [name] is a 74 y.o. male patient.  Principal Problem:    Pneumonia  Active Problems:    Benign essential HTN    Hepatic encephalopathy (HCC)    Myelodysplasia (myelodysplastic syndrome) (HCC)      SUBJECTIVE:  His wife reports multiple loose stools overnight with BMs every 1.5 hours. She thinks he has improved - able to hold cup to drink. Still slow mentation but is oriented.     ROS:  No fever, chills  No chest pain, palpitations  No SOB, cough  Gastrointestinal ROS: see above    Physical    VITALS:  BP 99/65 mmHg   Pulse 88   Temp(Src) 98.9 ??F (37.2 ??C) (Temporal)   Resp 16   Ht 5\' 5"  (1.651 m)   Wt 171 lb 14.4 oz (77.973 kg)   BMI 28.61 kg/m2   SpO2 93%  TEMPERATURE:  Current - Temp: 98.9 ??F (37.2 ??C); Max - Temp  Avg: 98.2 ??F (36.8 ??C)  Min: 97.8 ??F (36.6 ??C)  Max: 98.9 ??F (37.2 ??C)    NAD  No asterixis  Regular rate   Lungs soft crackle at base  Abdomen soft, ND, NT,  Bowel sounds normal.    Data    Data Review:    Recent Labs      07/25/14   0806   WBC  2.5*   HGB  8.1*   HCT  24.5*   MCV  111.7*   PLT  73*     Recent Labs      07/24/14   1555  07/25/14   0835  07/26/14   0536   NA  134*  132*  134*   K  4.6  4.3  4.2   CL  103  102  105   CO2  24  22  20*   BUN  22*  22*  21*   CREATININE  1.0  1.0  1.1     Recent Labs      07/25/14   0835  07/26/14   0536   AST  27  23   ALT  18  16   BILITOT  1.0  1.0   ALKPHOS  100  88     Recent Labs      07/25/14   0835   LIPASE  12.0*     Recent Labs      07/25/14   0806   PROTIME  15.7*   INR  1.37*     No results for input(s): PTT in the last 72 hours.        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 Followed by Dr. Einar Grad. No signs of GI bleeding. No ascites on [place] 2 weeks ago. Ammonia level is normal. He had subtle asterixis on exam yesterday but none today. This was likely d/t the pneumonia. Has been compliant with lactulose at home.   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 Followed by Dr. Al Corpus.   Pneumonia - per  primary team.    PLAN :  - lactulose for a goal of 3-4 BMs daily  - continue xifaxan    Discussed with Dr. Katherina Mires, [place]      I have personally performed a face to face diagnostic evaluation on this patient.  I have interviewed and examined the patient and I agree with the findings and recommended plan  of care.  In summary, my findings and plan are the following: when i saw pt he was mentating at baseline in d/w pt and wife, abd nontender, discussed cont tx pneumonia with primary team, cont xifaxan, and titrate lacutlose. They understand. Will sign off, call if needed. Follow up Dr Einar Grad as outpt.      Geannie Risen, MD  [place] [facility] [place]

## 2014-07-26 NOTE — Progress Notes (Signed)
Progress Note - Dr. Tasia Catchings - Internal Medicine  PCP: Alecia Lemming, DO [place] / [place] [address] [phone]    Hospital Day: 1  Code Status: Full Code  Current Diet: DIET GENERAL;        CC: follow up on medical issues    Subjective:   Andrew [name] is a 74 y.o. male.    He denies problems    D./w dr Al Corpus  On abx for hcap  Still with cough  Ct chest ordered    He denies chest pain, complains of shortness of breath, denies nausea,  denies emesis.    10 system Review of Systems is reviewed with patient, and pertinent positives are listed here: None . Otherwise, Review of systems is negative.     I have reviewed the patient's medical and social history in detail and updated the computerized patient record.  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 He  has past surgical history that includes AV fistula repair (1970's); Colonoscopy (4/12); Cardiac catheterization; eye surgery (04/12/2014); Cosmetic surgery (04/2013); bone marrow biopsy; and knee surgery.[name] He  reports that he has never smoked. He has never used smokeless tobacco. He reports that he does not drink alcohol or use illicit drugs..        Active Hospital Problems    Diagnosis Date Noted   ??? Pneumonia [J18.9] 07/25/2014   ??? 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 Hepatic encephalopathy ([place]) [K72.90] 07/07/2014   ??? Benign essential HTN [I10] 07/03/2014       Current facility-administered medications: carvedilol (COREG) tablet 6.25 mg, 6.25 mg, Oral,  BID WC  vitamin D (CHOLECALCIFEROL) tablet 1,000 Units, 1,000 Units, Oral, Daily  citalopram (CELEXA) tablet 10 mg, 10 mg, Oral, Daily  enoxaparin (LOVENOX) injection 80 mg, 1 mg/kg, Subcutaneous, BID  eplerenone (INSPRA) tablet 50 mg, 50 mg, Oral, Daily  furosemide (LASIX) tablet 40 mg, 40 mg, Oral, Daily  lisinopril (PRINIVIL;ZESTRIL) tablet 5 mg, 5 mg, Oral, Nightly  magnesium oxide (MAG-OX) tablet 400 mg, 400 mg, Oral, Daily  rifaximin (XIFAXAN) tablet 550 mg, 550 mg, Oral, BID  traMADol (ULTRAM) tablet 50 mg, 50 mg, Oral, Q12H PRN  0.9 % sodium chloride infusion, , Intravenous, Continuous  sodium chloride flush 0.9 % injection 10 mL, 10 mL, Intravenous, 2 times per day  sodium chloride flush 0.9 % injection 10 mL, 10 mL, Intravenous, PRN  acetaminophen (TYLENOL) tablet 650 mg, 650 mg, Oral, Q4H PRN  HYDROcodone-acetaminophen (NORCO) 5-325 MG per tablet 1 tablet, 1 tablet, Oral, Q4H PRN **OR** HYDROcodone-acetaminophen (NORCO) 5-325 MG per tablet 2 tablet, 2 tablet, Oral, Q4H PRN  morphine (PF) injection 2 mg, 2 mg, Intravenous, Q2H PRN **OR** morphine (PF) injection 4 mg, 4 mg, Intravenous, Q2H PRN  magnesium hydroxide (MILK OF MAGNESIA) 400 MG/5ML suspension 30 mL, 30 mL, Oral, Daily PRN  ondansetron (ZOFRAN) injection 4 mg, 4 mg, Intravenous, Q6H PRN  ciprofloxacin (CIPRO) IVPB 400 mg, 400 mg, Intravenous, Q12H **AND** piperacillin-tazobactam (ZOSYN) IVPB extended infusion 3.375 g, 3.375 g, Intravenous, Q8H  vancomycin (VANCOCIN) 1000 mg in dextrose 5% 200 mL IVPB, 1 g, Intravenous, Q12H  lactulose (CHRONULAC) 10 GM/15ML  solution 20 g, 20 g, Oral, 4 times per day  potassium chloride SA (K-DUR;KLOR-CON M) tablet 40 mEq, 40 mEq, Oral, PRN **OR** potassium chloride 20 MEQ/15ML (10%) oral solution 40 mEq, 40 mEq, Oral, PRN **OR** potassium chloride 10 mEq/100 mL IVPB (Peripheral Line), 10 mEq, Intravenous, PRN         Objective:  BP 99/65 mmHg   Pulse 88   Temp(Src) 98.9 ??F (37.2 ??C) (Temporal)   Resp 16   Ht 5'  5" (1.651 m)   Wt 171 lb 14.4 oz (77.973 kg)   BMI 28.61 kg/m2   SpO2 93%     Patient Vitals for the past 24 hrs:   BP Temp Temp src Pulse Resp SpO2 Weight   07/26/14 0715 - - - - - - 171 lb 14.4 oz (77.973 kg)   07/26/14 0015 99/65 mmHg 98.9 ??F (37.2 ??C) Temporal 88 16 93 % -   07/25/14 1950 97/58 mmHg 98.1 ??F (36.7 ??C) Temporal 75 16 - -   07/25/14 1605 (!) 88/57 mmHg 97.9 ??F (36.6 ??C) Temporal 74 18 95 % -   07/25/14 1200 95/58 mmHg 97.8 ??F (36.6 ??C) Temporal 66 18 96 % -   07/25/14 1101 91/46 mmHg - - 65 18 94 % -   07/25/14 1047 91/52 mmHg - - 67 20 95 % -   07/25/14 1031 94/53 mmHg - - 73 18 94 % -   07/25/14 1001 98/55 mmHg - - 76 21 93 % -   07/25/14 0946 95/53 mmHg - - 76 18 96 % -   07/25/14 0931 96/45 mmHg 97.2 ??F (36.2 ??C) Oral 70 20 95 % -   07/25/14 0916 91/53 mmHg - - 68 21 94 % -   07/25/14 0901 91/51 mmHg - - 67 19 95 % -   07/25/14 0846 91/50 mmHg - - 65 18 95 % -   07/25/14 0831 93/50 mmHg - - 61 21 95 % -     Patient Vitals for the past 96 hrs (Last 3 readings):   Weight   07/26/14 0715 171 lb 14.4 oz (77.973 kg)   07/25/14 0752 171 lb (77.565 kg)           Intake/Output Summary (Last 24 hours) at 07/26/14 2841  Last data filed at 07/26/14 0551   Gross per 24 hour   Intake 2221.25 ml   Output      0 ml   Net 2221.25 ml         Physical Exam:   S1, S2 normal, no murmur, rub or gallop, regular rate and rhythm  Rales bilat  abdomen is soft without significant tenderness, masses, organomegaly or guarding  extremities normal, atraumatic, no cyanosis or edema    Labs:  Lab Results   Component Value Date    WBC 2.5* 07/25/2014    HGB 8.1* 07/25/2014    HCT 24.5* 07/25/2014    PLT 73* 07/25/2014    CHOL 101 04/02/2014    TRIG 83 04/02/2014    HDL 22* 04/02/2014    ALT 16 07/26/2014    AST 23 07/26/2014    NA 134* 07/26/2014    K 4.2 07/26/2014    CL 105 07/26/2014    CREATININE 1.1 07/26/2014    BUN 21* 07/26/2014    CO2 20* 07/26/2014    TSH 2.15 08/15/2013    PSA 0.55 08/15/2013    INR 1.37* 07/25/2014     LABA1C 5.0 01/10/2014  LABMICR YES 07/25/2014     Lab Results   Component Value Date    TROPONINI <0.01 07/25/2014       Recent Imaging Results are Reviewed:  Xr Chest Standard Two Vw    07/25/2014   EXAMINATION: TWO VIEWS OF THE CHEST  07/25/2014 8:17 am  COMPARISON: Chest x-ray Jul 07, 2014  HISTORY: ORDERING SYSTEM PROVIDED HISTORY: generalized weakness, hypotension TECHNOLOGIST PROVIDED HISTORY: Ordering Physician Provided Reason for Exam: weakness Acuity: Unknown Type of Exam: Unknown  FINDINGS: There is new focal density of the right upper lobe above the hilum.  There also is new faint airspace disease of the lateral right lung base.  There is new focal opacity of the retrocardiac left lower lobe.  There is blunting of the left costophrenic angle consistent with pleural fluid.     07/25/2014   IMPRESSION: New bilateral multifocal airspace disease, most likely atelectasis or pneumonia although follow-up to ensure resolution is recommended.  New small to moderate size left pleural effusion.     Ct Head Wo Contrast    07/07/2014   EXAMINATION: CT OF THE HEAD WITHOUT CONTRAST  07/07/2014 11:11 pm  TECHNIQUE: CT of the head was performed without the administration of intravenous contrast.  COMPARISON: 04/01/2014  HISTORY: ORDERING SYSTEM PROVIDED HISTORY: dizzy TECHNOLOGIST PROVIDED HISTORY: Ordering Physician Provided Reason for Exam: dizzy  FINDINGS: BRAIN/VENTRICLES: There is no acute intracranial hemorrhage, mass effect or midline shift.  No abnormal extra-axial fluid collection.  The gray-white differentiation is maintained without evidence of an acute infarct.  There is no evidence of hydrocephalus.  ORBITS: The visualized portion of the orbits demonstrate no acute abnormality.  SINUSES: There is minimal mucosal thickening in the left maxillary sinus. The remaining visualized paranasal sinuses are clear.  The mastoid air cells are unremarkable.  SOFT TISSUES/SKULL:  No acute abnormality of the visualized skull  or soft tissues.     07/07/2014   IMPRESSION: No acute intracranial abnormality.     US Abdomen Complete    07/08/2014   EXAMINATION: COMPLETE ABDOMINAL ULTRASOUND 07/08/2014 6:04 pm  COMPARISON: None  HISTORY: ORDERING SYSTEM PROVIDED HISTORY: CIRRHOSIS TECHNOLOGIST PROVIDED HISTORY: Ordering Physician Provided Reason for Exam: cirrhosis, r/o ascites Acuity: Acute Type of Exam: Initial  FINDINGS: Study is limited by body habitus.  LIVER: Evaluation of the liver is limited.  There is diffuse increased echogenicity with no focal lesion noted.  Somewhat nodular contour of the liver is compatible with known cirrhosis.  BILIARY SYSTEM: Limited imaging of the gallbladder is grossly unremarkable.  Common bile duct is within normal limits measuring 6 mm.  KIDNEYS: Right kidney is nonvisualized.  Left kidney measures 10.1 x 4.9 x 5.3 cm and is grossly unremarkable.  PANCREAS: Pancreas is nonvisualized.  SPLEEN: Spleen measures 10.1 cm.  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  IVC: The IVC is patent.  AORTA: Aorta is obscured.  OTHER: No evidence of ascites.     07/08/2014   IMPRESSION: Limited study as above.  Cirrhosis and portal hypertension.  No evidence of ascites.     Xr Chest Portable    07/08/2014   EXAMINATION: SINGLE VIEW OF THE CHEST  07/07/2014 11:34 pm  COMPARISON: 06/03/2014  HISTORY: ORDERING SYSTEM PROVIDED HISTORY: other TECHNOLOGIST PROVIDED HISTORY: Ordering Physician Provided Reason for Exam: dizziness Acuity: Unknown Type of Exam: Unknown  FINDINGS: Cardiac leads project over the chest.  Diffuse interstitial opacity is demonstrated bilaterally.  Heterogeneous opacity is seen adjacent to the left heart border.  No evidence of pleural  effusion.  Negative for pneumothorax. Cardiac and mediastinal silhouettes are similar to prior.  Old posterior left 7th rib fracture.     07/08/2014   IMPRESSION: Mild pulmonary edema.     Vl Extremity Venous Right    07/10/2014   Lower Extremities DVT Study   Demographics    Patient Name      KDEN WAGSTER    Date of Study     07/09/2014          Gender              Male    Patient Number    [id number]          Date of Birth       1940/12/17    Visit Number      [id number]         Age                 18 year(s)    Accession Number  [id number]           Room Number         5284    Corporate ID      [id number]            Sonographer         Erasmo Leventhal,                                                            RVS    Ordering          Deniece Portela,   Interpreting        [facility]  Physician         MD                  Physician           Peggyann Shoals,                                                            MD, [facility], [place]   Procedure  Type of Study:    Veins:Lower Extremities DVT Study, VL EXTREMITY VENOUS DUPLEX RIGHT.    Generic Orders:VL EXTREMITY VENOUS DUPLEX RIGHT.    Vascular Sonographer Report   Additional Indications:Swelling  Impressions Right Impression Acute totally occluding deep vein thrombosis involving the right proximal PTV and one gastroc vein zone 5-6. Acute partially occluding deep vein thrombosis involving the popliteal vein and right one gastroc vein zone 5-6. No other evidence of deep vein or superficial vein thrombosis involving the right lower extremity and the left common femoral vein. Calf veins were not well visualized on the right due to patient positioning and edema.  Verbal to [employer].  Conclusions    Summary    Acute totally occluding deep vein thrombosis involving the right proximal  PTV and one gastroc vein zone 5-6.  Acute partially occluding deep vein thrombosis involving the popliteal vein  and right one gastroc vein zone 5-6.  Calf veins were not well visualized on the right due to patient positioning  and edema.  Signature    ------------------------------------------------------------------  Electronically signed by Moses Manners, MD, [facility], [facility]  (Interpreting physician) on 07/10/2014 at 11:34 AM   ------------------------------------------------------------------   Patient Status:Routine. Study Location:[facility] - Vascular Lab. Technical Quality:Poor visualization.  Risk Factors History +------------------+----------+----------------------------------------------+ !Diagnosis         !Date      !Comments                                      ! +------------------+----------+----------------------------------------------+ !Previous Procedure!06/27/2014!Venous Doppler Bilateral: Acute DVT right     ! !                  !          !gastroc veins.                                ! +------------------+----------+----------------------------------------------+  Velocities are measured in cm/s ; Diameters are measured in mm  Right Lower Extremities DVT Study Measurements Right 2D Measurements +------------------------+----------+---------------+----------+ !Location                !Visualized!Compressibility!Thrombosis! +------------------------+----------+---------------+----------+ !Sapheno Femoral Junction!Yes       !Yes            !None      ! +------------------------+----------+---------------+----------+ !GSV Thigh               !Yes       !Yes            !None      ! +------------------------+----------+---------------+----------+ !Common Femoral          !Yes       !Yes            !None      ! +------------------------+----------+---------------+----------+ !Prox Femoral            !Yes       !Yes            !None      ! +------------------------+----------+---------------+----------+ !Mid Femoral             !Yes       !Yes            !None      ! +------------------------+----------+---------------+----------+ !Dist Femoral            !Yes       !Yes            !None      ! +------------------------+----------+---------------+----------+ !Deep Femoral            !Yes       !Yes            !None      ! +------------------------+----------+---------------+----------+ !Popliteal               !Yes        !Partial        !Acute     ! +------------------------+----------+---------------+----------+ !GSV Below Knee          !Yes       !Yes            !None      ! +------------------------+----------+---------------+----------+ !Gastroc                 !Yes       !Partial        !Acute     ! +------------------------+----------+---------------+----------+ !  PTV                     !Yes       !No             !Acute     ! +------------------------+----------+---------------+----------+ !Peroneal                !Yes       !Yes            !None      ! +------------------------+----------+---------------+----------+ !SSV                     !Yes       !Yes            !None      ! +------------------------+----------+---------------+----------+  Right Doppler Measurements +--------------+------+------+------------+ !Location      !Signal!Reflux!Reflux (sec)! +--------------+------+------+------------+ !Common Femoral!Phasic!      !            ! +--------------+------+------+------------+ !Femoral       !Phasic!      !            ! +--------------+------+------+------------+ !Deep Femoral  !Phasic!      !            ! +--------------+------+------+------------+ !Popliteal     !Phasic!      !            ! +--------------+------+------+------------+  Left Lower Extremities DVT Study Measurements Left 2D Measurements +------------------------+----------+---------------+----------+ !Location                !Visualized!Compressibility!Thrombosis! +------------------------+----------+---------------+----------+ !Sapheno Femoral Junction!Yes       !Yes            !None      ! +------------------------+----------+---------------+----------+ !Common Femoral          !Yes       !Yes            !None      ! +------------------------+----------+---------------+----------+  Left Doppler Measurements +--------------+------+------+------------+ !Location      !Signal!Reflux!Reflux (sec)! +--------------+------+------+------------+  !Common Femoral!Phasic!      !            ! +--------------+------+------+------------+    Cta Pulmonary With Contrast    06/27/2014   EXAMINATION: CTA OF THE CHEST 06/27/2014 2:53 pm  TECHNIQUE: CTA of the chest was performed after the administration of intravenous contrast.  Multiplanar reformatted images are provided for review.  MIP images are provided for review.  Patient received 100 mL Isovue 370 intravenous contrast.  COMPARISON: CT chest with contrast June 13, 2014.  HISTORY: ORDERING SYSTEM PROVIDED HISTORY: DVT (deep venous thrombosis), unspecified later[facility] [employer]) TECHNOLOGIST PROVIDED HISTORY: Ordering Physician Provided Reason for Exam: DVT, SOB Injury/Trauma or Illness: Illness/Other Acuity: Acute Type of Exam: Initial Relevant Medica/Surgical History: CHF, cardiomyopathy  FINDINGS: Pulmonary Arteries: Pulmonary arteries are adequately opacified for evaluation.  No evidence of intraluminal filling defect to suggest pulmonary embolism.  Main pulmonary artery is normal in caliber.  Mediastinum: No evidence of mediastinal lymphadenopathy.  Heart is enlarged, similar in appearance.  The heart and pericardium demonstrate no acute abnormality.  There is no acute abnormality of the thoracic aorta. Nodularity of thyroid similar in appearance.  Lungs/pleura: The central airways are patent.  There is a moderate right-sided pleural effusion which appears slightly decreased compared to prior study.  There is mild patchy dependent opacities in the right upper lobe, similar in appearance.  There are dependent subpleural  opacities within the lingula and left lower lobe.  There is mild opacity within the right middle lobe which is linear in appearance in appears slightly improved compared to prior study.  2 nodular appearing densities are again seen in the right lower lobe on image number 65, unchanged.  No pneumothorax.  Upper Abdomen: Cirrhotic liver with mildly distended gallbladder is similar in appearance to prior  study.  Soft Tissues/Bones: Multiple compression fractures again seen, most pronounced at the thoracolumbar junction, similar in appearance.  Old right-sided rib fractures are also seen.  No acute bone or soft tissue abnormality.  Gynecomastia is again seen.     06/27/2014   IMPRESSION: No evidence of pulmonary embolism.  Moderate right pleural effusion, minimally decreased.  Persistent patchy areas of airspace opacity which may be related to atelectasis versus pneumonia, slightly improved.  2 nodular appearing densities are seen within the right lower lobe which may be related to the airspace disease or represent lung nodules.  Follow-up recommended with dedicated chest CT in 3 months.  Remote compression fractures again seen.  Cirrhotic liver.     Vl Extremity Venous Bilateral    07/09/2014   Vascular Lower Extremities DVT Study Procedure  -- PRELIMINARY SONOGRAPHER REPORT --    Demographics    Patient Name      VAISHNAV DEMARTIN    Date of Study     07/09/2014          Gender              Male    Patient Number    [id number]          Date of Birth       23-May-1940    Visit Number      [id number]         Age                 75 year(s)    Accession Number  [id number]           Room Number         3716    Corporate ID      [id number]            Sonographer         Erasmo Leventhal,                                                            RVS    Ordering          Deniece Portela,   Interpreting        [facility]  Physician         MD                  Physician   Procedure  Type of Study:    Veins:Lower Extremities DVT Study, VL EXTREMITY VENOUS DUPLEX RIGHT.   Tech Comments Right Acute totally occluding deep vein thrombosis involving the right proximal PTV and one gastroc vein zone 5-6. Acute partially occluding deep vein thrombosis involving the popliteal vein and right one gastroc vein zone 5-6. No other evidence of deep vein or superficial vein thrombosis involving the right lower extremity and the left common femoral  vein. Calf veins were not well visualized on the right due to patient  positioning and edema.  verbal to [employer].    Vl Extremity Venous Bilateral    06/27/2014   Lower Extremities DVT Study   Demographics    Patient Name       LUDWIN FLAHIVE    Date of Study      06/27/2014        Gender              Male    Patient Number     [id number]        Date of Birth       09-Jul-1940    Visit Number       [id number]       Age                 22 year(s)    Accession Number   [id number]         Room Number    Corporate ID       [id number]          Sonographer         Elberta Fortis,                                                           RVS    Ordering Physician Joycelyn Das, MD  Interpreting        [facility]                                       Physician           Wonda Cerise MD,                                                           [facility]   Procedure  Type of Study:    Veins:Lower Extremities DVT Study, VASC EXTREMITY VENOUS DUPLEX BILATERAL.    Vascular Sonographer Report   Additional Indications:Swelliing  Impressions Right Impression Acute partially occluding deep vein thrombosis involving the right set of gastroc veins zone 5-6. No other evidence of deep vein or superficial vein thrombosis involving the right lower extremity. Reflux noted in the right FV. Left Impression No evidence of deep vein or superficial vein thrombosis involving the left lower extremity.  Verbal to Dr. Adalberto Ill. Patient released.  Conclusions    Summary    -Acute partially occluding deep vein thrombosis involving the right set of  gastroc veins zone 5-6.  -Reflux noted in the right FV.    Signature    ------------------------------------------------------------------  Electronically signed by Wonda Cerise MD, [facility] (Interpreting  physician) on 06/27/2014 at 01:38 PM  ------------------------------------------------------------------   Patient Status:Routine. Study Location:[facility] - Vascular Lab. Technical  Quality:Adequate visualization.  Velocities are measured in cm/s ; Diameters are measured in mm  Right Lower Extremities DVT Study Measurements Right 2D Measurements +------------------------+----------+---------------+----------+ !Location                !Visualized!Compressibility!Thrombosis! +------------------------+----------+---------------+----------+ !Sapheno Femoral Junction!Yes       !Yes            !  None      ! +------------------------+----------+---------------+----------+ !GSV Thigh               !Yes       !Yes            !None      ! +------------------------+----------+---------------+----------+ !Common Femoral          !Yes       !Yes            !None      ! +------------------------+----------+---------------+----------+ !Prox Femoral            !Yes       !Yes            !None      ! +------------------------+----------+---------------+----------+ !Mid Femoral             !Yes       !Yes            !None      ! +------------------------+----------+---------------+----------+ !Dist Femoral            !Yes       !Yes            !None      ! +------------------------+----------+---------------+----------+ !Deep Femoral            !Yes       !Yes            !None      ! +------------------------+----------+---------------+----------+ !Popliteal               !Yes       !Yes            !None      ! +------------------------+----------+---------------+----------+ !GSV Below Knee          !Yes       !Yes            !None      ! +------------------------+----------+---------------+----------+ !Gastroc                 !Yes       !Partial        !Acute     ! +------------------------+----------+---------------+----------+ !PTV                     !Yes       !Yes            !None      ! +------------------------+----------+---------------+----------+ !Peroneal                !Yes       !Yes            !None      ! +------------------------+----------+---------------+----------+ !SSV                      !Yes       !Yes            !None      ! +------------------------+----------+---------------+----------+  Right Doppler Measurements +------------------------+------+------+------------+ !Location                !Signal!Reflux!Reflux (sec)! +------------------------+------+------+------------+ !Sapheno Femoral Junction!Phasic!No    !            ! +------------------------+------+------+------------+ !Common Femoral          !Phasic!No    !            ! +------------------------+------+------+------------+ !Femoral                 !  Phasic!Yes   !            ! +------------------------+------+------+------------+ !Deep Femoral            !Phasic!No    !            ! +------------------------+------+------+------------+ !Popliteal               !Phasic!No    !            ! +------------------------+------+------+------------+  Left Lower Extremities DVT Study Measurements Left 2D Measurements +------------------------+----------+---------------+----------+ !Location                !Visualized!Compressibility!Thrombosis! +------------------------+----------+---------------+----------+ !Sapheno Femoral Junction!Yes       !Yes            !None      ! +------------------------+----------+---------------+----------+ !GSV Thigh               !Yes       !Yes            !None      ! +------------------------+----------+---------------+----------+ !Common Femoral          !Yes       !Yes            !None      ! +------------------------+----------+---------------+----------+ !Prox Femoral            !Yes       !Yes            !None      ! +------------------------+----------+---------------+----------+ !Mid Femoral             !Yes       !Yes            !None      ! +------------------------+----------+---------------+----------+ !Dist Femoral            !Yes       !Yes            !None      ! +------------------------+----------+---------------+----------+ !Deep Femoral            !Yes       !Yes            !None      !  +------------------------+----------+---------------+----------+ !Popliteal               !Yes       !Yes            !None      ! +------------------------+----------+---------------+----------+ !GSV Below Knee          !Yes       !Yes            !None      ! +------------------------+----------+---------------+----------+ !Gastroc                 !Yes       !Yes            !None      ! +------------------------+----------+---------------+----------+ !PTV                     !Yes       !Yes            !None      ! +------------------------+----------+---------------+----------+ !Peroneal                !Yes       !Yes            !None      ! +------------------------+----------+---------------+----------+ !SSV                     !  Yes       !Yes            !None      ! +------------------------+----------+---------------+----------+  Left Doppler Measurements +------------------------+------+------+------------+ !Location                !Signal!Reflux!Reflux (sec)! +------------------------+------+------+------------+ !Sapheno Femoral Junction!Phasic!No    !            ! +------------------------+------+------+------------+ !Common Femoral          !Phasic!No    !            ! +------------------------+------+------+------------+ !Femoral                 !Phasic!No    !            ! +------------------------+------+------+------------+ !Deep Femoral            !Phasic!No    !            ! +------------------------+------+------+------------+ !Popliteal               !Phasic!No    !            ! +------------------------+------+------+------------+      Assessment and Plan:  Patient Active Hospital Problem List:   Pneumonia (07/25/2014)    Assessment: pt with pna, likley hcap    Plan: cont abx. 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 Will have to hold chemo tx given active infection              Deniece Portela  07/26/2014

## 2014-07-26 NOTE — Progress Notes (Signed)
Assessment complete, resting in bed watching tv. Plan of care reviewed.  No c/o or needs expressed at this time. No s/s of distress noted.  Pt rates a Morse Fall Risk: High (45 and higher); bed alarm set: Yes.  Appropriate safety precautions are in place; Call light and bedside table in reach.  Bed in lowest position with wheels locked.  Pt encouraged to call for needs. Pt VU. Will continue to monitor.  Pecolia Ades, LPN

## 2014-07-27 ENCOUNTER — Inpatient Hospital Stay: Admit: 2014-07-27 | Payer: MEDICARE | Primary: Internal Medicine

## 2014-07-27 LAB — CBC WITH AUTO DIFFERENTIAL
Basophils %: 1.3 %
Basophils Absolute: 0 10*3/uL (ref 0.0–0.2)
Eosinophils %: 1 %
Eosinophils Absolute: 0 10*3/uL (ref 0.0–0.6)
Hematocrit: 20.3 % — CL (ref 40.5–52.5)
Hemoglobin: 6.7 g/dL — CL (ref 13.5–17.5)
Lymphocytes %: 61.3 %
Lymphocytes Absolute: 1.2 10*3/uL (ref 1.0–5.1)
MCH: 36.9 pg — ABNORMAL HIGH (ref 26.0–34.0)
MCHC: 32.9 g/dL (ref 31.0–36.0)
MCV: 112.2 fL — ABNORMAL HIGH (ref 80.0–100.0)
MPV: 9.2 fL (ref 5.0–10.5)
Monocytes %: 9.9 %
Monocytes Absolute: 0.2 10*3/uL (ref 0.0–1.3)
Neutrophils %: 26.5 %
Neutrophils Absolute: 0.5 10*3/uL — CL (ref 1.7–7.7)
PLATELET SLIDE REVIEW: DECREASED
Platelets: 66 10*3/uL — ABNORMAL LOW (ref 135–450)
RBC: 1.8 M/uL — ABNORMAL LOW (ref 4.20–5.90)
RDW: 25.3 % — ABNORMAL HIGH (ref 12.4–15.4)
WBC: 2 10*3/uL — ABNORMAL LOW (ref 4.0–11.0)

## 2014-07-27 LAB — BASIC METABOLIC PANEL
Anion Gap: 8 (ref 3–16)
BUN: 17 mg/dL (ref 7–20)
CO2: 20 mmol/L — ABNORMAL LOW (ref 21–32)
Calcium: 8.4 mg/dL (ref 8.3–10.6)
Chloride: 107 mmol/L (ref 99–110)
Creatinine: 1.1 mg/dL (ref 0.8–1.3)
GFR African American: >60 (ref >60)
GFR Non-African American: >60 (ref >60)
Glucose: 128 mg/dL — ABNORMAL HIGH (ref 70–99)
Potassium: 4.3 mmol/L (ref 3.5–5.1)
Sodium: 135 mmol/L — ABNORMAL LOW (ref 136–145)

## 2014-07-27 LAB — VANCOMYCIN LEVEL, TROUGH: Vancomycin Tr: 21.5 ug/mL (ref 10.0–20.0)

## 2014-07-27 LAB — ERYTHROPOIETIN: Erythropoietin: 383 mU/mL — ABNORMAL HIGH (ref 4–27)

## 2014-07-27 LAB — TYPE AND SCREEN
ABO/Rh: A NEG
Antibody Screen: NEGATIVE

## 2014-07-27 MED ORDER — SODIUM CHLORIDE 0.9 % IV BOLUS
0.9 % | Freq: Once | INTRAVENOUS | Status: AC
Start: 2014-07-27 — End: 2014-07-28
  Administered 2014-07-27: 23:00:00 250 mL via INTRAVENOUS

## 2014-07-27 MED ORDER — LACTULOSE 10 GM/15ML PO SOLN
10 GM/15ML | Freq: Once | ORAL | Status: AC
Start: 2014-07-27 — End: 2014-07-27
  Administered 2014-07-27: 20:00:00 20 g via ORAL

## 2014-07-27 MED ORDER — DIPHENHYDRAMINE HCL 25 MG PO TABS
25 MG | Freq: Once | ORAL | Status: AC
Start: 2014-07-27 — End: 2014-07-27
  Administered 2014-07-27: 21:00:00 25 mg via ORAL

## 2014-07-27 MED ORDER — LACTULOSE 10 GM/15ML PO SOLN
10 GM/15ML | Freq: Three times a day (TID) | ORAL | Status: DC
Start: 2014-07-27 — End: 2014-08-06
  Administered 2014-07-28 – 2014-08-03 (×7): 20 g via ORAL

## 2014-07-27 MED ORDER — ACETAMINOPHEN 325 MG PO TABS
325 MG | Freq: Once | ORAL | Status: AC
Start: 2014-07-27 — End: 2014-07-27
  Administered 2014-07-27: 21:00:00 325 mg via ORAL

## 2014-07-27 MED ORDER — AMOXICILLIN-POT CLAVULANATE 875-125 MG PO TABS
875-125 MG | Freq: Two times a day (BID) | ORAL | Status: DC
Start: 2014-07-27 — End: 2014-08-06
  Administered 2014-07-28 – 2014-08-06 (×20): 1 via ORAL

## 2014-07-27 MED ORDER — VANCOMYCIN HCL 1000 MG IV SOLR
1000 MG | INTRAVENOUS | Status: DC
Start: 2014-07-27 — End: 2014-07-27
  Administered 2014-07-27: 19:00:00 1250 mg via INTRAVENOUS

## 2014-07-27 MED FILL — LOVENOX 80 MG/0.8ML SC SOLN: 80 MG/0.8ML | SUBCUTANEOUS | Qty: 0.8

## 2014-07-27 MED FILL — CARVEDILOL 6.25 MG PO TABS: 6.25 MG | ORAL | Qty: 1

## 2014-07-27 MED FILL — NORMAL SALINE FLUSH 0.9 % IV SOLN: 0.9 % | INTRAVENOUS | Qty: 10

## 2014-07-27 MED FILL — ACETAMINOPHEN 325 MG PO TABS: 325 MG | ORAL | Qty: 1

## 2014-07-27 MED FILL — ZOSYN 3-0.375 GM/50ML IV SOLN: INTRAVENOUS | Qty: 50

## 2014-07-27 MED FILL — VANCOMYCIN HCL IN DEXTROSE 1-5 GM/200ML-% IV SOLN: 1-5 GM/200ML-% | INTRAVENOUS | Qty: 200

## 2014-07-27 MED FILL — FUROSEMIDE 40 MG PO TABS: 40 MG | ORAL | Qty: 1

## 2014-07-27 MED FILL — DIPHENHYDRAMINE HCL 25 MG PO TABS: 25 MG | ORAL | Qty: 1

## 2014-07-27 MED FILL — LACTULOSE 10 GM/15ML PO SOLN: 10 GM/15ML | ORAL | Qty: 30

## 2014-07-27 MED FILL — SODIUM CHLORIDE 0.9 % IV SOLN: 0.9 % | INTRAVENOUS | Qty: 250

## 2014-07-27 MED FILL — CITALOPRAM HYDROBROMIDE 20 MG PO TABS: 20 MG | ORAL | Qty: 1

## 2014-07-27 MED FILL — LISINOPRIL 5 MG PO TABS: 5 MG | ORAL | Qty: 1

## 2014-07-27 MED FILL — XIFAXAN 550 MG PO TABS: 550 MG | ORAL | Qty: 1

## 2014-07-27 MED FILL — SODIUM CHLORIDE 0.9 % IV SOLN: 0.9 % | INTRAVENOUS | Qty: 1000

## 2014-07-27 MED FILL — VANCOMYCIN HCL 1000 MG IV SOLR: 1000 MG | INTRAVENOUS | Qty: 1.25

## 2014-07-27 MED FILL — MAGNESIUM OXIDE 400 (241.3 MG) MG PO TABS: 400 (241.3 Mg) MG | ORAL | Qty: 1

## 2014-07-27 MED FILL — CIPRO IN D5W 400 MG/200ML IV SOLN: 400 MG/200ML | INTRAVENOUS | Qty: 200

## 2014-07-27 MED FILL — EPLERENONE 25 MG PO TABS: 25 MG | ORAL | Qty: 2

## 2014-07-27 MED FILL — VITAMIN D3 25 MCG (1000 UT) PO TABS: 25 MCG (1000 UT) | ORAL | Qty: 1

## 2014-07-27 NOTE — Progress Notes (Signed)
Resting in bed w/eyes closed. Does not answer questions or give eye contact. Denies complaint. Wife at bedside answers all questions. VS and assessment completed.

## 2014-07-27 NOTE — Progress Notes (Signed)
Clinical Pharmacy Note:    Vancomycin trough level = 21.5 on 07/26/14  and vancomycin dose was held.  Change Vancomycin to 1250mg mg  IVPB q18h  to produce a predicted peak of 38.1 mcg/ml and  a predicted trough of 16.56 mcg/ml based on (Population-based pharmacokinetic analysis).    Per pharmacy consult,  Fenton Foy PharmD

## 2014-07-27 NOTE — Telephone Encounter (Signed)
Diane from [facility] called about decreased hemoglobin. ePaged provider.

## 2014-07-27 NOTE — Progress Notes (Signed)
Vitals and assessment complete.  BP elevated, all other VSS.  Pt reports no pain at this time.  Pt resting comfortably and verbalizes no other needs at this time.  Will continue to monitor BP.

## 2014-07-27 NOTE — Plan of Care (Signed)
Problem: Falls - Risk of  Goal: Absence of falls  Pt at risk for falls.  See MORSE Fall Risk Scale.  Fall protocol in place.  Bed in lowest position and locked.  Personal belongings and call light within reach.  Offered toileting.  Instructed pt to call out and wait for assistance before getting out of bed.  Will continue to monitor.

## 2014-07-27 NOTE — Progress Notes (Signed)
Pt and wife educated on RBC transfusion. Both verbalize understanding and state pt has had previous transfusion. Family in to visit.

## 2014-07-27 NOTE — Progress Notes (Signed)
Department of Internal Medicine  General Internal Medicine   Progress Note      SUBJECTIVE: general weakness , moderate dyspnea on exertion     History obtained from chart review and the patient  General ROS: positive for  - fatigue and malaise  negative for - chills, fever or night sweats  Psychological ROS: positive for - anxiety and disorientation  negative for - behavioral disorder, hallucinations or hostility  Ophthalmic ROS: negative  Respiratory ROS: positive for - cough, pleuritic pain and shortness of breath  negative for - hemoptysis, orthopnea, stridor or wheezing  Cardiovascular ROS: positive for - chest pain, dyspnea on exertion and shortness of breath  negative for - loss of consciousness or palpitations  Gastrointestinal ROS: no abdominal pain, change in bowel habits, or black or bloody stools  Genito-Urinary ROS: no dysuria, trouble voiding, or hematuria  Musculoskeletal ROS: positive for - joint pain  negative for - gait disturbance or muscular weakness    OBJECTIVE      Medications      Current facility-administered medications: 0.9 % sodium chloride bolus, 250 mL, Intravenous, Once  lactulose (CHRONULAC) 10 GM/15ML solution 20 g, 20 g, Oral, TID  amoxicillin-clavulanate (AUGMENTIN) 875-125 MG per tablet 1 tablet, 1 tablet, Oral, 2 times per day  ioversol (OPTIRAY) 68 % injection 100 mL, 100 mL, Intravenous, ONCE PRN  sodium chloride (PF) 0.9 % injection 10 mL, 10 mL, Intravenous, PRN  carvedilol (COREG) tablet 6.25 mg, 6.25 mg, Oral, BID WC  vitamin D (CHOLECALCIFEROL) tablet 1,000 Units, 1,000 Units, Oral, Daily  citalopram (CELEXA) tablet 10 mg, 10 mg, Oral, Daily  enoxaparin (LOVENOX) injection 80 mg, 1 mg/kg, Subcutaneous, BID  eplerenone (INSPRA) tablet 50 mg, 50 mg, Oral, Daily  furosemide (LASIX) tablet 40 mg, 40 mg, Oral, Daily  lisinopril (PRINIVIL;ZESTRIL) tablet 5 mg, 5 mg, Oral, Nightly  magnesium oxide (MAG-OX) tablet 400 mg, 400 mg, Oral, Daily  rifaximin (XIFAXAN) tablet 550 mg, 550  mg, Oral, BID  traMADol (ULTRAM) tablet 50 mg, 50 mg, Oral, Q12H PRN  0.9 % sodium chloride infusion, , Intravenous, Continuous  sodium chloride flush 0.9 % injection 10 mL, 10 mL, Intravenous, 2 times per day  sodium chloride flush 0.9 % injection 10 mL, 10 mL, Intravenous, PRN  acetaminophen (TYLENOL) tablet 650 mg, 650 mg, Oral, Q4H PRN  HYDROcodone-acetaminophen (NORCO) 5-325 MG per tablet 1 tablet, 1 tablet, Oral, Q4H PRN **OR** HYDROcodone-acetaminophen (NORCO) 5-325 MG per tablet 2 tablet, 2 tablet, Oral, Q4H PRN  morphine (PF) injection 2 mg, 2 mg, Intravenous, Q2H PRN **OR** morphine (PF) injection 4 mg, 4 mg, Intravenous, Q2H PRN  magnesium hydroxide (MILK OF MAGNESIA) 400 MG/5ML suspension 30 mL, 30 mL, Oral, Daily PRN  ondansetron (ZOFRAN) injection 4 mg, 4 mg, Intravenous, Q6H PRN  potassium chloride SA (K-DUR;KLOR-CON M) tablet 40 mEq, 40 mEq, Oral, PRN **OR** potassium chloride 20 MEQ/15ML (10%) oral solution 40 mEq, 40 mEq, Oral, PRN **OR** potassium chloride 10 mEq/100 mL IVPB (Peripheral Line), 10 mEq, Intravenous, PRN    Physical      VITALS:  BP 98/64 mmHg   Pulse 68   Temp(Src) 98.4 ??F (36.9 ??C) (Temporal)   Resp 16   Ht 5\' 5"  (1.651 m)   Wt 176 lb 14.4 oz (80.241 kg)   BMI 29.44 kg/m2   SpO2 95%  TEMPERATURE:  Current - Temp: 98.4 ??F (36.9 ??C); Max - Temp  Avg: 98.6 ??F (37 ??C)  Min: 97.9 ??F (36.6 ??C)  Max: 99.2 ??  F (37.3 ??C)  RESPIRATIONS RANGE: Resp  Avg: 16  Min: 16  Max: 16  PULSE RANGE: Pulse  Avg: 71.9  Min: 66  Max: 80  BLOOD PRESSURE RANGE:  Systolic (56EPP), IRJ:188 mmHg, Min:98 mmHg, CZY:606 mmHg  ; Diastolic (30ZSW), FUX:32 mmHg, Min:62 mmHg, Max:77 mmHg    PULSE OXIMETRY RANGE: SpO2  Avg: 95.3 %  Min: 94 %  Max: 97 %  24HR INTAKE/OUTPUT:    Intake/Output Summary (Last 24 hours) at 07/28/14 0940  Last data filed at 07/27/14 2121   Gross per 24 hour   Intake   2893 ml   Output    400 ml   Net   2493 ml     CONSTITUTIONAL:  fatigued, alert, cooperative, mild distress and appears older  than stated age  ENT:  normocepalic, without obvious abnormality, atraumatic  NECK:  supple, symmetrical, trachea midline, skin normal and no stridor  LUNGS:  Vesicular , no wheezes , bil scattered crackles   CARDIOVASCULAR:  normal apical pulses, tachycardic with regular rhythm and normal S1 and S2  ABDOMEN:  Soft , BS + , non tender   MUSCULOSKELETAL:  Trace edema   NEUROLOGIC:  No acute focal deficit   SKIN:  Warm and pale  and no bruising or bleeding    Data      No results found for: PHART, PO2ART, PCO2ART, HCO3ART, BEART, O2SATART    Lab Results   Component Value Date    NA 135 07/27/2014    K 4.3 07/27/2014    CL 107 07/27/2014    CO2 20 07/27/2014    BUN 17 07/27/2014    CREATININE 1.1 07/27/2014    GLUCOSE 128 07/27/2014    CALCIUM 8.4 07/27/2014     Lab Results   Component Value Date    WBC 2.0 07/28/2014    WBC 2.8 07/19/2014    HGB 7.5 07/28/2014    HCT 22.2 07/28/2014    MCV 106.8 07/28/2014    PLT 59 07/28/2014         Lab Results   Component Value Date    INR 1.37* 07/25/2014    PROTIME 15.7* 07/25/2014       ASSESSMENT AND PLAN      Patient Active Problem List   Diagnosis   ??? Erectile dysfunction   ??? Thrombocytopenia   ??? Edema   ??? BPH (benign prostatic hyperplasia)   ??? Alpha-1-antitrypsin deficiency   ??? Cirrhosis, nonalcoholic ([place])   ??? Mild CAD   ??? Chronic systolic CHF (congestive heart failure) ([place])   ??? Non-ischemic cardiomyopathy ([place])   ??? Lumbar compression fracture ([place])   ??? Chronic low back pain   ??? Osteopenia   ??? Coagulopathy ([place])   ??? Abnormal EKG   ??? Visual changes   ??? Fixed pupil of right eye   ??? Anemia   ??? Hemispheric carotid artery syndrome   ??? CAD in native artery   ??? Hyperlipidemia   ??? 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 Bronchiectasis without complication ([place])   ??? Benign essential HTN   ??? Hepatic encephalopathy ([facility])   ??? Ataxia   ??? Pancytopenia ([place])   ??? DVT (deep venous thrombosis) ([place])   ??? Myelodysplasia (myelodysplastic syndrome) ([place])   ??? Pneumonia     Ct Augmentin for pneumonia     Rifaximin and Chronulac for Hepatic encephalopathy which is better  Transfuse  PRBC for anemia    eplirenone for portal hypertension    care  plan reviewed , discussed with nursing staff

## 2014-07-27 NOTE — Plan of Care (Signed)
Problem: Falls - Risk of  Goal: Absence of falls  Outcome: Ongoing  Up to [facility] w/assist of 2. Wife stays w/pt; unsure if pt is aware of limitations. Wife utilizes call light to request assistance as needed. Transfers slowly and needs encouragement and direction for transfer. Bed in low position, wheels locked, anti slip footwear in use, call light, telephone, and bedside table within reach. Bed alarm engaged.

## 2014-07-27 NOTE — Plan of Care (Signed)
Problem: Falls - Risk of  Goal: Absence of falls  Outcome: Ongoing  Pt remains free from falls and accidental injury at this time. Bed in lowest position with wheels locked. Non skid footwear on feet. Pt instructed to call out for needs, verbalizes understanding. Call light within reach. Bed alarm activated. Will cont to monitor.  Ohn Bostic, RN

## 2014-07-28 LAB — CBC
Hematocrit: 22.2 % — ABNORMAL LOW (ref 40.5–52.5)
Hemoglobin: 7.5 g/dL — ABNORMAL LOW (ref 13.5–17.5)
MCH: 36 pg — ABNORMAL HIGH (ref 26.0–34.0)
MCHC: 33.7 g/dL (ref 31.0–36.0)
MCV: 106.8 fL — ABNORMAL HIGH (ref 80.0–100.0)
MPV: 9.1 fL (ref 5.0–10.5)
Platelets: 59 10*3/uL — ABNORMAL LOW (ref 135–450)
RBC: 2.08 M/uL — ABNORMAL LOW (ref 4.20–5.90)
RDW: 26.5 % — ABNORMAL HIGH (ref 12.4–15.4)
WBC: 2 10*3/uL — ABNORMAL LOW (ref 4.0–11.0)

## 2014-07-28 LAB — PREPARE RBC (CROSSMATCH)

## 2014-07-28 MED ORDER — SODIUM CHLORIDE 0.9 % IV BOLUS
0.9 % | Freq: Once | INTRAVENOUS | Status: AC
Start: 2014-07-28 — End: 2014-07-28
  Administered 2014-07-28: 15:00:00 250 mL via INTRAVENOUS

## 2014-07-28 MED ORDER — DIPHENHYDRAMINE HCL 25 MG PO TABS
25 MG | Freq: Once | ORAL | Status: AC
Start: 2014-07-28 — End: 2014-07-28
  Administered 2014-07-28: 16:00:00 25 mg via ORAL

## 2014-07-28 MED FILL — CITALOPRAM HYDROBROMIDE 20 MG PO TABS: 20 MG | ORAL | Qty: 1

## 2014-07-28 MED FILL — NORMAL SALINE FLUSH 0.9 % IV SOLN: 0.9 % | INTRAVENOUS | Qty: 10

## 2014-07-28 MED FILL — SODIUM CHLORIDE 0.9 % IV SOLN: 0.9 % | INTRAVENOUS | Qty: 1000

## 2014-07-28 MED FILL — DIPHENHYDRAMINE HCL 25 MG PO TABS: 25 MG | ORAL | Qty: 1

## 2014-07-28 MED FILL — LOVENOX 80 MG/0.8ML SC SOLN: 80 MG/0.8ML | SUBCUTANEOUS | Qty: 0.8

## 2014-07-28 MED FILL — SODIUM CHLORIDE 0.9 % IV SOLN: 0.9 % | INTRAVENOUS | Qty: 250

## 2014-07-28 MED FILL — EPLERENONE 25 MG PO TABS: 25 MG | ORAL | Qty: 2

## 2014-07-28 MED FILL — XIFAXAN 550 MG PO TABS: 550 MG | ORAL | Qty: 1

## 2014-07-28 MED FILL — AMOXICILLIN-POT CLAVULANATE 875-125 MG PO TABS: 875-125 MG | ORAL | Qty: 1

## 2014-07-28 MED FILL — CARVEDILOL 6.25 MG PO TABS: 6.25 MG | ORAL | Qty: 1

## 2014-07-28 MED FILL — FUROSEMIDE 40 MG PO TABS: 40 MG | ORAL | Qty: 1

## 2014-07-28 MED FILL — LACTULOSE 10 GM/15ML PO SOLN: 10 GM/15ML | ORAL | Qty: 30

## 2014-07-28 MED FILL — LISINOPRIL 5 MG PO TABS: 5 MG | ORAL | Qty: 1

## 2014-07-28 MED FILL — VITAMIN D3 25 MCG (1000 UT) PO TABS: 25 MCG (1000 UT) | ORAL | Qty: 1

## 2014-07-28 MED FILL — MAGNESIUM OXIDE 400 (241.3 MG) MG PO TABS: 400 (241.3 Mg) MG | ORAL | Qty: 1

## 2014-07-28 NOTE — Plan of Care (Signed)
Problem: Risk for Impaired Skin Integrity  Goal: Tissue integrity - skin and mucous membranes  Structural intactness and normal physiological function of skin and  mucous membranes.   Pt at risk for impaired skin integrity.  No signs of skin breakdown thus far.  Pt turns self in bed and ambulates occasionally.  Foot of bed elevated.  Encouraged frequent position changes.  Will continue to monitor.

## 2014-07-28 NOTE — Progress Notes (Signed)
Department of Internal Medicine  General Internal Medicine   Progress Note      SUBJECTIVE: severe malaise , faligue and dyspnea on exertion     History obtained from chart review and the patient  General ROS: positive for  - fatigue and malaise  negative for - chills, fever or night sweats  Psychological ROS: positive for - anxiety and disorientation  negative for - behavioral disorder, hallucinations or hostility  Ophthalmic ROS: negative  Respiratory ROS: positive for - cough, pleuritic pain and shortness of breath  negative for - hemoptysis, orthopnea, stridor or wheezing  Cardiovascular ROS: positive for - chest pain, dyspnea on exertion and shortness of breath  negative for - loss of consciousness or palpitations  Gastrointestinal ROS: no abdominal pain, change in bowel habits, or black or bloody stools  Genito-Urinary ROS: no dysuria, trouble voiding, or hematuria  Musculoskeletal ROS: positive for - joint pain  negative for - gait disturbance or muscular weakness    OBJECTIVE      Medications      Current facility-administered medications: 0.9 % sodium chloride bolus, 250 mL, Intravenous, Once  lactulose (CHRONULAC) 10 GM/15ML solution 20 g, 20 g, Oral, TID  amoxicillin-clavulanate (AUGMENTIN) 875-125 MG per tablet 1 tablet, 1 tablet, Oral, 2 times per day  ioversol (OPTIRAY) 68 % injection 100 mL, 100 mL, Intravenous, ONCE PRN  sodium chloride (PF) 0.9 % injection 10 mL, 10 mL, Intravenous, PRN  carvedilol (COREG) tablet 6.25 mg, 6.25 mg, Oral, BID WC  vitamin D (CHOLECALCIFEROL) tablet 1,000 Units, 1,000 Units, Oral, Daily  citalopram (CELEXA) tablet 10 mg, 10 mg, Oral, Daily  enoxaparin (LOVENOX) injection 80 mg, 1 mg/kg, Subcutaneous, BID  eplerenone (INSPRA) tablet 50 mg, 50 mg, Oral, Daily  furosemide (LASIX) tablet 40 mg, 40 mg, Oral, Daily  lisinopril (PRINIVIL;ZESTRIL) tablet 5 mg, 5 mg, Oral, Nightly  magnesium oxide (MAG-OX) tablet 400 mg, 400 mg, Oral, Daily  rifaximin (XIFAXAN) tablet 550 mg,  550 mg, Oral, BID  traMADol (ULTRAM) tablet 50 mg, 50 mg, Oral, Q12H PRN  0.9 % sodium chloride infusion, , Intravenous, Continuous  sodium chloride flush 0.9 % injection 10 mL, 10 mL, Intravenous, 2 times per day  sodium chloride flush 0.9 % injection 10 mL, 10 mL, Intravenous, PRN  acetaminophen (TYLENOL) tablet 650 mg, 650 mg, Oral, Q4H PRN  HYDROcodone-acetaminophen (NORCO) 5-325 MG per tablet 1 tablet, 1 tablet, Oral, Q4H PRN **OR** HYDROcodone-acetaminophen (NORCO) 5-325 MG per tablet 2 tablet, 2 tablet, Oral, Q4H PRN  morphine (PF) injection 2 mg, 2 mg, Intravenous, Q2H PRN **OR** morphine (PF) injection 4 mg, 4 mg, Intravenous, Q2H PRN  magnesium hydroxide (MILK OF MAGNESIA) 400 MG/5ML suspension 30 mL, 30 mL, Oral, Daily PRN  ondansetron (ZOFRAN) injection 4 mg, 4 mg, Intravenous, Q6H PRN  potassium chloride SA (K-DUR;KLOR-CON M) tablet 40 mEq, 40 mEq, Oral, PRN **OR** potassium chloride 20 MEQ/15ML (10%) oral solution 40 mEq, 40 mEq, Oral, PRN **OR** potassium chloride 10 mEq/100 mL IVPB (Peripheral Line), 10 mEq, Intravenous, PRN    Physical      VITALS:  BP 101/62 mmHg   Pulse 65   Temp(Src) 98.4 ??F (36.9 ??C) (Temporal)   Resp 16   Ht 5\' 5"  (1.651 m)   Wt 176 lb 14.4 oz (80.241 kg)   BMI 29.44 kg/m2   SpO2 96%  TEMPERATURE:  Current - Temp: 98.4 ??F (36.9 ??C); Max - Temp  Avg: 98.5 ??F (36.9 ??C)  Min: 98 ??F (36.7 ??C)  Max:  99.2 ??F (37.3 ??C)  RESPIRATIONS RANGE: Resp  Avg: 16.4  Min: 16  Max: 18  PULSE RANGE: Pulse  Avg: 68.4  Min: 64  Max: 80  BLOOD PRESSURE RANGE:  Systolic (18ACZ), YSA:63 mmHg, Min:92 mmHg, KZS:010 mmHg  ; Diastolic (93ATF), TDD:22 mmHg, Min:56 mmHg, Max:68 mmHg    PULSE OXIMETRY RANGE: SpO2  Avg: 94.9 %  Min: 94 %  Max: 96 %  24HR INTAKE/OUTPUT:      Intake/Output Summary (Last 24 hours) at 07/28/14 2113  Last data filed at 07/28/14 1825   Gross per 24 hour   Intake   1085 ml   Output    400 ml   Net    685 ml     CONSTITUTIONAL:  fatigued, alert, cooperative, mild distress and appears  older than stated age  ENT:  normocepalic, without obvious abnormality, atraumatic  NECK:  supple, symmetrical, trachea midline, skin normal and no stridor  LUNGS:  Vesicular , no wheezes , bil scattered crackles   CARDIOVASCULAR:  normal apical pulses, tachycardic with regular rhythm and normal S1 and S2  ABDOMEN:  Soft , BS + , non tender   MUSCULOSKELETAL:  Trace edema   NEUROLOGIC:  No acute focal deficit   SKIN:  Warm and pale  and no bruising or bleeding    Data      No results found for: PHART, PO2ART, PCO2ART, HCO3ART, BEART, O2SATART    Lab Results   Component Value Date    NA 135 07/27/2014    K 4.3 07/27/2014    CL 107 07/27/2014    CO2 20 07/27/2014    BUN 17 07/27/2014    CREATININE 1.1 07/27/2014    GLUCOSE 128 07/27/2014    CALCIUM 8.4 07/27/2014     Lab Results   Component Value Date    WBC 2.0 07/28/2014    WBC 2.8 07/19/2014    HGB 7.5 07/28/2014    HCT 22.2 07/28/2014    MCV 106.8 07/28/2014    PLT 59 07/28/2014         Lab Results   Component Value Date    INR 1.37* 07/25/2014    PROTIME 15.7* 07/25/2014       ASSESSMENT AND PLAN      Patient Active Problem List   Diagnosis   ??? Erectile dysfunction   ??? Thrombocytopenia   ??? Edema   ??? BPH (benign prostatic hyperplasia)   ??? Alpha-1-antitrypsin deficiency   ??? Cirrhosis, nonalcoholic ([place])   ??? Mild CAD   ??? Chronic systolic CHF (congestive heart failure) ([place])   ??? Non-ischemic cardiomyopathy ([place])   ??? Lumbar compression fracture ([place])   ??? Chronic low back pain   ??? Osteopenia   ??? Coagulopathy ([place])   ??? Abnormal EKG   ??? Visual changes   ??? Fixed pupil of right eye   ??? Anemia   ??? Hemispheric carotid artery syndrome   ??? CAD in native artery   ??? Hyperlipidemia   ??? 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 Bronchiectasis without complication ([place])   ??? Benign essential HTN   ??? Hepatic encephalopathy ([facility])   ??? Ataxia   ??? Pancytopenia ([place])   ??? DVT (deep venous thrombosis) ([place])   ??? Myelodysplasia (myelodysplastic syndrome) ([place])   ??? Pneumonia     Additional  transfusion  Of PRBCs in progress    Rifaximin and Chronulac for Hepatic encephalopathy which is better  Watch electrolytes panel  And H/H  eplirenone for portal hypertension    care plan reviewed , discussed with nursing staff

## 2014-07-28 NOTE — Progress Notes (Signed)
Pt resting in bed. Unit PRBC infusing w/o difficulty, pt tolerating it well. Family in room. Pt denies complaint.

## 2014-07-28 NOTE — Progress Notes (Signed)
Assessment and VS completed. Up to [facility] w/assist of 2 and use of walker. Transfers slowly w/encouragement and guidance. Wife in room.

## 2014-07-28 NOTE — Progress Notes (Signed)
Vitals and assessment complete. VSS.  Pt resting comfortably with eyes closed and denies any pain.  Pt verbalizes no other needs at this time.

## 2014-07-28 NOTE — Progress Notes (Signed)
[facility]   Progress Note      SUBJECTIVE:      Events noted. No CP, SOB, external bleeding.    OBJECTIVE    Physical  VITALS:  BP 98/64 mmHg   Pulse 68   Temp(Src) 98.4 ??F (36.9 ??C) (Temporal)   Resp 16   Ht 5\' 5"  (1.651 m)   Wt 176 lb 14.4 oz (80.241 kg)   BMI 29.44 kg/m2   SpO2 95%  TEMPERATURE:  Current - Temp: 98.4 ??F (36.9 ??C); Max - Temp  Avg: 98.6 ??F (37 ??C)  Min: 97.9 ??F (36.6 ??C)  Max: 99.2 ??F (37.3 ??C)  BLOOD PRESSURE RANGE:  Systolic (51WCH), ENI:778 mmHg, Min:97 mmHg, EUM:353 mmHg  ; Diastolic (61WER), XVQ:00 mmHg, Min:60 mmHg, Max:77 mmHg    24HR INTAKE/OUTPUT:      Intake/Output Summary (Last 24 hours) at 07/28/14 0908  Last data filed at 07/27/14 2121   Gross per 24 hour   Intake   3303 ml   Output    400 ml   Net   2903 ml       Drowsy  alert oriented  HEENT: + Pallor   Neck: Supple. No lymphadenopathy  Lungs: Basal crackles + Respiratory efforts normal.  CVS: S1S2 normal. No murmurs or gallops.  Abdomen: Soft BS +. Distension +  Extremities: No edema  Neuro: No focal deficits.  Skin: No Rash Petechiae      Data  Labs:  General Labs:  CBC with Differential:    Lab Results   Component Value Date    WBC 2.0 07/28/2014    WBC 2.8 07/19/2014    RBC 2.08 07/28/2014    RBC 2.44 07/19/2014    HGB 7.5 07/28/2014    HCT 22.2 07/28/2014    PLT 59 07/28/2014    MCV 106.8 07/28/2014    MCH 36.0 07/28/2014    MCHC 33.7 07/28/2014    RDW 26.5 07/28/2014    SEGSPCT 42.6 05/14/2011    BANDSPCT 2 07/25/2014    METASPCT 1 05/03/2014    LYMPHOPCT 61.3 07/27/2014    LYMPHOPCT 57.0 07/19/2014    MONOPCT 9.9 07/27/2014    MYELOPCT 1 05/03/2014    EOSPCT 2.1 05/14/2010    BASOPCT 1.3 07/27/2014    MONOSABS 0.2 07/27/2014    LYMPHSABS 1.2 07/27/2014    EOSABS 0.0 07/27/2014    BASOSABS 0.0 07/27/2014    DIFFTYPE Scan-K 05/14/2011     BMP:    Lab Results   Component Value Date    NA 135 07/27/2014    K 4.3 07/27/2014    CL 107 07/27/2014    CO2 20 07/27/2014    BUN 17 07/27/2014    LABALBU 1.6 07/26/2014     CREATININE 1.1 07/27/2014    CALCIUM 8.4 07/27/2014    GFRAA >60 07/27/2014    GFRAA >60 05/14/2011    LABGLOM >60 07/27/2014    GLUCOSE 128 07/27/2014     Hepatic Function Panel:    Lab Results   Component Value Date    ALKPHOS 88 07/26/2014    ALT 16 07/26/2014    AST 23 07/26/2014    PROT 5.3 07/26/2014    PROT 6.0 05/14/2011    BILITOT 1.0 07/26/2014    BILIDIR 0.4 07/09/2014    IBILI 0.7 07/09/2014    LABALBU 1.6 07/26/2014     LDH:  No results found for: LDH  PT/INR:    Lab Results   Component Value  Date    PROTIME 15.7 07/25/2014    INR 1.37 07/25/2014    INR 1.17 05/14/2010     PTT:    Lab Results   Component Value Date    APTT 46.5 07/25/2014    APTT 29.7 10/17/2009   [APTT    Current Medications  Current facility-administered medications: 0.9 % sodium chloride bolus, 250 mL, Intravenous, Once  lactulose (CHRONULAC) 10 GM/15ML solution 20 g, 20 g, Oral, TID  amoxicillin-clavulanate (AUGMENTIN) 875-125 MG per tablet 1 tablet, 1 tablet, Oral, 2 times per day  ioversol (OPTIRAY) 68 % injection 100 mL, 100 mL, Intravenous, ONCE PRN  sodium chloride (PF) 0.9 % injection 10 mL, 10 mL, Intravenous, PRN  carvedilol (COREG) tablet 6.25 mg, 6.25 mg, Oral, BID WC  vitamin D (CHOLECALCIFEROL) tablet 1,000 Units, 1,000 Units, Oral, Daily  citalopram (CELEXA) tablet 10 mg, 10 mg, Oral, Daily  enoxaparin (LOVENOX) injection 80 mg, 1 mg/kg, Subcutaneous, BID  eplerenone (INSPRA) tablet 50 mg, 50 mg, Oral, Daily  furosemide (LASIX) tablet 40 mg, 40 mg, Oral, Daily  lisinopril (PRINIVIL;ZESTRIL) tablet 5 mg, 5 mg, Oral, Nightly  magnesium oxide (MAG-OX) tablet 400 mg, 400 mg, Oral, Daily  rifaximin (XIFAXAN) tablet 550 mg, 550 mg, Oral, BID  traMADol (ULTRAM) tablet 50 mg, 50 mg, Oral, Q12H PRN  0.9 % sodium chloride infusion, , Intravenous, Continuous  sodium chloride flush 0.9 % injection 10 mL, 10 mL, Intravenous, 2 times per day  sodium chloride flush 0.9 % injection 10 mL, 10 mL, Intravenous, PRN  acetaminophen  (TYLENOL) tablet 650 mg, 650 mg, Oral, Q4H PRN  HYDROcodone-acetaminophen (NORCO) 5-325 MG per tablet 1 tablet, 1 tablet, Oral, Q4H PRN **OR** HYDROcodone-acetaminophen (NORCO) 5-325 MG per tablet 2 tablet, 2 tablet, Oral, Q4H PRN  morphine (PF) injection 2 mg, 2 mg, Intravenous, Q2H PRN **OR** morphine (PF) injection 4 mg, 4 mg, Intravenous, Q2H PRN  magnesium hydroxide (MILK OF MAGNESIA) 400 MG/5ML suspension 30 mL, 30 mL, Oral, Daily PRN  ondansetron (ZOFRAN) injection 4 mg, 4 mg, Intravenous, Q6H PRN  potassium chloride SA (K-DUR;KLOR-CON M) tablet 40 mEq, 40 mEq, Oral, PRN **OR** potassium chloride 20 MEQ/15ML (10%) oral solution 40 mEq, 40 mEq, Oral, PRN **OR** potassium chloride 10 mEq/100 mL IVPB (Peripheral Line), 10 mEq, Intravenous, PRN    ASSESSMENT AND PLAN    1. Pneumonia: No fever. Continue abx. Hold Neupogen.     2. Hepatic encephalopathy: Better    3. NASH    4. Pancytopenia: see below    5. Myelodysplastic syndrome:  Andrew [name] as outpatient one acute events settle down    6. Anemia: Hb was 6.7 yesterday. Got 1 unit PRBC yesterday. Will get one more unit today      Mare Loan, MD

## 2014-07-28 NOTE — Plan of Care (Signed)
Problem: Risk for Impaired Skin Integrity  Goal: Tissue integrity - skin and mucous membranes  Structural intactness and normal physiological function of skin and  mucous membranes.   Outcome: Ongoing  Skin assessment completed. Multiple areas of bruising noted, skin otherwise intact. Band aid removed from bone marrow biopsy site @ left hip; area cleaned; bruising noted. Peri care provided with Sage wipes and Aloe Vesta applied to reddened peri areas and posterior upper thighs. Pt repositioned and turned using pillow support. Attempt to keep skin clean and dry.  [place] to elbows and heels. Turn and reposition at least q2h.

## 2014-07-29 LAB — PREPARE RBC (CROSSMATCH)

## 2014-07-29 LAB — PERIPHERAL BLOOD SMEAR, PATH REVIEW

## 2014-07-29 LAB — HEMOGLOBIN AND HEMATOCRIT
Hematocrit: 25 % — ABNORMAL LOW (ref 40.5–52.5)
Hemoglobin: 8.4 g/dL — ABNORMAL LOW (ref 13.5–17.5)

## 2014-07-29 LAB — VANCOMYCIN LEVEL, TROUGH: Vancomycin Tr: 10.5 ug/mL (ref 10.0–20.0)

## 2014-07-29 MED ORDER — DARBEPOETIN ALFA 100 MCG/ML IJ SOLN
100 MCG/ML | Freq: Once | INTRAMUSCULAR | Status: AC
Start: 2014-07-29 — End: 2014-07-29
  Administered 2014-07-29: 23:00:00 100 ug via SUBCUTANEOUS

## 2014-07-29 MED ORDER — DARBEPOETIN ALFA 500 MCG/ML IJ SOSY
500 MCG/ML | Freq: Once | INTRAMUSCULAR | Status: AC
Start: 2014-07-29 — End: 2014-07-29
  Administered 2014-07-29: 23:00:00 400 ug via SUBCUTANEOUS

## 2014-07-29 MED ORDER — SODIUM CHLORIDE 0.9 % IV BOLUS
0.9 % | Freq: Once | INTRAVENOUS | Status: AC
Start: 2014-07-29 — End: 2014-07-29
  Administered 2014-07-29: 15:00:00 250 mL via INTRAVENOUS

## 2014-07-29 MED ORDER — DARBEPOETIN ALFA 500 MCG/ML IJ SOSY
500 MCG/ML | Freq: Once | INTRAMUSCULAR | Status: DC
Start: 2014-07-29 — End: 2014-07-29

## 2014-07-29 MED FILL — AMOXICILLIN-POT CLAVULANATE 875-125 MG PO TABS: 875-125 MG | ORAL | Qty: 1

## 2014-07-29 MED FILL — CARVEDILOL 6.25 MG PO TABS: 6.25 MG | ORAL | Qty: 1

## 2014-07-29 MED FILL — CITALOPRAM HYDROBROMIDE 20 MG PO TABS: 20 MG | ORAL | Qty: 1

## 2014-07-29 MED FILL — SODIUM CHLORIDE 0.9 % IV SOLN: 0.9 % | INTRAVENOUS | Qty: 1000

## 2014-07-29 MED FILL — ARANESP (ALBUMIN FREE) 500 MCG/ML IJ SOSY: 500 MCG/ML | INTRAMUSCULAR | Qty: 1

## 2014-07-29 MED FILL — FUROSEMIDE 40 MG PO TABS: 40 MG | ORAL | Qty: 1

## 2014-07-29 MED FILL — SODIUM CHLORIDE 0.9 % IV SOLN: 0.9 % | INTRAVENOUS | Qty: 250

## 2014-07-29 MED FILL — NORMAL SALINE FLUSH 0.9 % IV SOLN: 0.9 % | INTRAVENOUS | Qty: 10

## 2014-07-29 MED FILL — MAGNESIUM OXIDE 400 (241.3 MG) MG PO TABS: 400 (241.3 Mg) MG | ORAL | Qty: 1

## 2014-07-29 MED FILL — LACTULOSE 10 GM/15ML PO SOLN: 10 GM/15ML | ORAL | Qty: 30

## 2014-07-29 MED FILL — ARANESP (ALBUMIN FREE) 100 MCG/ML IJ SOLN: 100 MCG/ML | INTRAMUSCULAR | Qty: 1

## 2014-07-29 MED FILL — LISINOPRIL 5 MG PO TABS: 5 MG | ORAL | Qty: 1

## 2014-07-29 MED FILL — LOVENOX 80 MG/0.8ML SC SOLN: 80 MG/0.8ML | SUBCUTANEOUS | Qty: 0.8

## 2014-07-29 MED FILL — VITAMIN D3 25 MCG (1000 UT) PO TABS: 25 MCG (1000 UT) | ORAL | Qty: 1

## 2014-07-29 MED FILL — XIFAXAN 550 MG PO TABS: 550 MG | ORAL | Qty: 1

## 2014-07-29 MED FILL — EPLERENONE 25 MG PO TABS: 25 MG | ORAL | Qty: 2

## 2014-07-29 NOTE — Progress Notes (Signed)
Up to chair with OT assist- vitals stable- 2nd unit PRBC infusing with no significant assessment changes- family at chairside- call light in reach

## 2014-07-29 NOTE — Progress Notes (Signed)
Dr Tasia Catchings in to assess- made aware of low b/p during night and with ambulation with PT-2 units PRBC to be transfused as new order -

## 2014-07-29 NOTE — Progress Notes (Signed)
3 hours PRBC infusing- vitals stable- blood pressure improving- awake alert and oriented x3 -forgetful -repositioned to left side with pillow -wife at bedside

## 2014-07-29 NOTE — Progress Notes (Signed)
1 hour after PRBC infusing vitals stable- no significant assessment changes - blood pressure up to 90's SBP-wife at bedside- call light in reach - bed alarm on -PRBC running at 100 ml/hr with no difficulties

## 2014-07-29 NOTE — Progress Notes (Signed)
BP is 83 50 with a MAP of 61.  02 is 96%.  Will notify Dr. Tasia Catchings and continue to monitor

## 2014-07-29 NOTE — Progress Notes (Signed)
Am meds given - swallowed with no difficulty -po coreg held d/t low b/p- lovenox held d/t low platelet count - wife at bedside- now preparing for unit PRBC to be transfused- call light in reach

## 2014-07-29 NOTE — Progress Notes (Signed)
[facility]   Progress Note      07/29/2014 4:38 PM        Name: Andrew [name] .              Admitted: 07/25/2014    SUBJECTIVE:  Pt is weak.  Doing a little better.  Having loose stools.  Appetite is poor but his wife is making him eat.  C/o hip pain but can't remember which hip.  It bothers him when he gets up to walk and when he lays on it.  Started acutely in the past day or so.    Reviewed interval ancillary notes    Current Medications    0.9 % sodium chloride bolus Once   darbepoetin alfa-polysorbate (ARANESP) injection 500 mcg Once   lactulose (CHRONULAC) 10 GM/15ML solution 20 g TID   amoxicillin-clavulanate (AUGMENTIN) 875-125 MG per tablet 1 tablet 2 times per day   ioversol (OPTIRAY) 68 % injection 100 mL ONCE PRN   sodium chloride (PF) 0.9 % injection 10 mL PRN   carvedilol (COREG) tablet 6.25 mg BID WC   vitamin D (CHOLECALCIFEROL) tablet 1,000 Units Daily   citalopram (CELEXA) tablet 10 mg Daily   enoxaparin (LOVENOX) injection 80 mg BID   eplerenone (INSPRA) tablet 50 mg Daily   furosemide (LASIX) tablet 40 mg Daily   lisinopril (PRINIVIL;ZESTRIL) tablet 5 mg Nightly   magnesium oxide (MAG-OX) tablet 400 mg Daily   rifaximin (XIFAXAN) tablet 550 mg BID   traMADol (ULTRAM) tablet 50 mg Q12H PRN   0.9 % sodium chloride infusion Continuous   sodium chloride flush 0.9 % injection 10 mL 2 times per day   sodium chloride flush 0.9 % injection 10 mL PRN   acetaminophen (TYLENOL) tablet 650 mg Q4H PRN   HYDROcodone-acetaminophen (NORCO) 5-325 MG per tablet 1 tablet Q4H PRN   Or    HYDROcodone-acetaminophen (NORCO) 5-325 MG per tablet 2 tablet Q4H PRN   morphine (PF) injection 2 mg Q2H PRN   Or    morphine (PF) injection 4 mg Q2H PRN   magnesium hydroxide (MILK OF MAGNESIA) 400 MG/5ML suspension 30 mL Daily PRN   ondansetron (ZOFRAN) injection 4 mg Q6H PRN   potassium chloride SA (K-DUR;KLOR-CON M) tablet 40 mEq PRN   Or    potassium chloride 20 MEQ/15ML (10%) oral solution 40 mEq PRN   Or     potassium chloride 10 mEq/100 mL IVPB (Peripheral Line) PRN       Objective:  BP 98/64 mmHg   Pulse 72   Temp(Src) 98.5 ??F (36.9 ??C) (Temporal)   Resp 16   Ht 5' 5" (1.651 m)   Wt 176 lb 8 oz (80.06 kg)   BMI 29.37 kg/m2   SpO2 94%    Intake/Output Summary (Last 24 hours) at 07/29/14 1638  Last data filed at 07/29/14 1422   Gross per 24 hour   Intake 3422.5 ml   Output    850 ml   Net 2572.5 ml    Wt Readings from Last 3 Encounters:   07/29/14 176 lb 8 oz (80.06 kg)   07/24/14 171 lb (77.565 kg)   07/19/14 173 lb 6.4 oz (78.654 kg)       General appearance:  Appears comfortable  Eyes: Sclera clear. Pupils equal.  ENT: Moist oral mucosa. Trachea midline, no adenopathy.  Cardiovascular: Regular rhythm, normal S1, S2. No murmur. No edema in lower extremities  Respiratory: Not using accessory muscles. Good inspiratory effort. Clear  to auscultation bilaterally, no wheeze or crackles.   Decreased in the bases.  GI: Abdomen soft, no tenderness, not distended, normal bowel sounds  Musculoskeletal: No cyanosis in digits, neck supple  Neurology: CN 2-12 grossly intact. No speech or motor deficits  Psych: Normal affect. Alert and oriented in time, place and person  Skin: Warm, dry, normal turgor    Labs and Tests:  CBC:   Recent Labs      07/27/14   0645  07/28/14   0638   WBC  2.0*  2.0*   HGB  6.7*  7.5*   PLT  66*  59*     BMP:  Recent Labs      07/27/14   0645   NA  135*   K  4.3   CL  107   CO2  20*   BUN  17   CREATININE  1.1   GLUCOSE  128*     Hepatic: No results for input(s): AST, ALT, ALB, BILITOT, ALKPHOS in the last 72 hours.    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 Al Corpus plans to start [place] as an outpatient.  Discussed with his wife that this can be  delayed until his acute infection resolves.  Will give Aranesp today (planning to start q2w procrit as OP as well).  Transfusing 2 units prbc today.    Right sided pneumonia - on augmentin.    Hip pain - acute.  Pt can't tell me which side at the moment.  Probably musculoskeletal.  Will get imaging if it doesn't improve.     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    Hepatic encephalopathy - continue lactulose.    Right leg DVT - continue lovenox.    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    Weakness - ARU consulted.  Pt/ot.    Discussed with the patient and his family.    We will arrange for outpatient f/u with Dr. Al Corpus after he is discharged.    [name], [place]  [facility]  Office: [phone]  Cell: [phone]

## 2014-07-29 NOTE — Progress Notes (Signed)
Occupational Therapy  Daily Treatment Note  Date: 07/29/2014    Patient Name: Andrew [name]  WVP:[id number]     DOB: [birth year]     Restrictions  Restrictions/Precautions  Restrictions/Precautions: Fall Risk (High fall risk)  Required Braces or Orthoses?: No  Position Activity Restriction  Other position/activity restrictions: up as tolerated; pt's hgb 7.5, RN approval prior to approach secondary to blood transfusion  Subjective   General  Chart Reviewed: Yes  Additional Pertinent Hx: HTN, kidney stones, cirrhosis, leukopenia, anemia, h/o DVT, cancer, glaucoma, liver disease, myelodysplasia, bone marrow biopsy  Family / Caregiver Present: Yes  Diagnosis: Pneumonia  Subjective  Subjective: Patient sleeping upon entering room, wife at bedside. Easily awoke and agreeable to therapy.   Pre Treatment Pain Screening  Pain at present: 2  Scale Used: Numeric Score  Intervention List: Patient able to continue with treatment  Pain Assessment  Patient Currently in Pain: Yes  Pain Assessment: 0-10  Pain Level: 3  Pain Type: Acute pain  Pain Location: Hip  Pain Orientation: Right  Pain Intervention(s): Repositioned;Ambulation/Increased activity  Vital Signs  Patient Currently in Pain: Yes   Orientation  Orientation  Overall Orientation Status: Within Functional Limits (slow responses)  Objective    ADL  Transfer: Contact guard assistance;Stand by assistance;Minimal assistance  Additional Comments: Pt lying supine in bed w/ HOB slightly elevated. Pt transferred supine>sit w/ increased time and CGA. Pt sat EOB w/ SBA for ~4-5 minutes w/ no LOBs. Pt transferred sit>stand w/ Min A. Pt stood w/ SBA, ambulated about 4-5 steps w/ RW w/ CGA to bedside chair, and transferred stand>sit w/ verbal cues for hand placement and SBA. Pt was left in chair with all needs in reach.     Balance  Sitting Balance: Stand by assistance  Standing Balance: Contact guard assistance  Standing Balance  Time: ~2-3 minutes  Activity: standing from EOB  Sit to  stand: Stand by assistance  Stand to sit: Stand by assistance  Functional Mobility  Functional - Mobility Device: Rolling Walker  Assist Level: Contact guard assistance  Functional Mobility Comments: Pt required increase time and multiple verbal cues for taking steps and turning to get lined up with chair.  Bed mobility  Rolling: Minimal assistance  Supine to Sit: Minimal assistance  Bed to Chair: Contact guard assistance  Transfers  Stand Step Transfers: Contact guard assistance  Sit to stand: Stand by assistance  Stand to sit: Stand by assistance     Cognition  Overall Cognitive Status: Impaired  Arousal/Alertness: Delayed responses to stimuli  Attention Span: Difficulty attending to directions  Insights: Decreased awareness of deficits  Additional Comments: Patient very lethargic improved over time.      Perception  Overall Perceptual Status: WFL     Assessment   Assessment: Decreased functional mobility ;Decreased functional activity tolerance;Decreased ADL status;Decreased cognition  Comments: Patient is not at baseline level following admission due to pneumonia. Palliative care consult pending. Wife reports has 24/7 care at home but reports patient with increased weakness, wife agreeable to SNF  Treatment Diagnosis: Debility and decreased ADLs following pneumonia  Rehab Potential: Fair;Guarded  Recommendations: SNF consult;Patient would benefit from continued therapy after discharge  Goal Formulation: Patient  Requires OT Follow Up: Yes  Total Treatment Time: 29  Timed Code Treatment Minutes: 29 Minutes  Response to Treatment  Response to Treatment: Patient limited by fatigue;Patient Tolerated treatment well  Safety Devices  Safety Devices in place: Yes  Type of devices: Call light within reach;Nurse notified;Left in  chair;All fall risk precautions in place;Chair alarm in place;Gait belt;Patient at risk for falls  Restraints  Initially in place: No  OT D/C Equipment  Equipment Needed: Yes  Other: Recommend a  RW and shower chair          Discharge Recommendations:  SNF consult, Patient would benefit from continued therapy after discharge     Plan   Plan  Times per week: 2-5  Times per day: Daily  Patient Education: POC, functional mobility, ADLs, bed mobility, walker management and safety, transfers, safety- pt demonstrated understanding       Short term goals  Time Frame for Short term goals: until discharge  Short term goal 1: Min assist bathing- Not addressed today  Short term goal 2: Min assist dressing- Not addressed today  Short term goal 3: CGA funtional mobility with RW or with 1 hand held assist- Goal met 5/23 (Pt ambulated ~4-5 steps w/ RW w/ CGA)  Short term goal 4: CGA toileting- Goal not addressed today  Long term goals  Time Frame for Long term goals : STGs= LTGs       Andrew [name] LEE Andrew [name]  License and Documentation Cosign  Therapy License Number: Dolores Patty, OTS   I agree with the above note. OT directly observed the OTS with the patient during session. Thank you, Dyanne Iha, OTR/L, [place].

## 2014-07-29 NOTE — Progress Notes (Signed)
2nd PRBC started after verification with RN Tye [place] - vitals stable- RN at bedside monitoring 1st 15 minutes as per protocol

## 2014-07-29 NOTE — Progress Notes (Signed)
Department of Physical Medicine & Rehabilitation  Dr. Wyatt Haste Progress Note  07/29/2014  12:18 PM    Patient Name:   Andrew [name]  Date of Birth:  [birth year]    Diagnosis: Pneumonia        Subjective: Rehab Consult received. Chart reviewed. Patient seen. Patient discussed with our Rehab Admissions nurse. Patient with admit for pneumonia, myelodysplasia and hepatic encephalopathy. His info has been sent into his insurance for approval of Rehab Unit treatment, but he has [employer], which does not allow the choice of Rehab Unit treatment for his diagnosis.     BP 94/59 mmHg   Pulse 70   Temp(Src) 98 ??F (36.7 ??C) (Temporal)   Resp 16   Ht 5\' 5"  (1.651 m)   Wt 176 lb 8 oz (80.06 kg)   BMI 29.37 kg/m2   SpO2 95%    Last 24 hour lab  No results found for this or any previous visit (from the past 24 hour(s)).      Plan: Will follow.      Dr. Wyatt Haste

## 2014-07-29 NOTE — Progress Notes (Signed)
Progress Note - Dr. Tasia Catchings - Internal Medicine  PCP: Alecia Lemming, DO [place] / [place] [address] [phone]    Hospital Day: 4  Code Status: Full Code  Current Diet: DIET GENERAL; No Added Salt (3-4 GM)        CC: follow up on medical issues    Subjective:   Andrew [name] is a 74 y.o. male.    He denies problems    Counts still low  bp borderline    Family requests pm+r eval to see if he qualifies for ARU    He denies chest pain, denies shortness of breath, denies nausea,  denies emesis.    10 system Review of Systems is reviewed with patient, and pertinent positives are listed here: None . Otherwise, Review of systems is negative.     I have reviewed the patient's medical and social history in detail and updated the computerized patient record.  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 He  has past surgical history that includes AV fistula repair (1970's); Colonoscopy (4/12); Cardiac catheterization; eye surgery (04/12/2014); Cosmetic surgery (04/2013); bone marrow biopsy; and knee surgery.[name] He  reports that he has never smoked. He has never used smokeless tobacco. He reports that he does not drink alcohol or use illicit drugs..        Active Hospital Problems    Diagnosis Date Noted   ??? Pneumonia [J18.9] 07/25/2014   ??? 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 Hepatic encephalopathy ([place]) [K72.90] 07/07/2014   ??? Benign essential HTN [I10] 07/03/2014       Current facility-administered medications:  lactulose (CHRONULAC) 10 GM/15ML solution 20 g, 20 g, Oral, TID  amoxicillin-clavulanate (AUGMENTIN) 875-125 MG per tablet 1 tablet, 1 tablet, Oral, 2 times per day  ioversol (OPTIRAY) 68 % injection 100 mL, 100 mL, Intravenous, ONCE PRN  sodium chloride (PF) 0.9 % injection 10 mL, 10 mL, Intravenous, PRN  carvedilol (COREG) tablet 6.25 mg, 6.25 mg, Oral, BID WC  vitamin D (CHOLECALCIFEROL) tablet 1,000 Units, 1,000 Units, Oral, Daily  citalopram (CELEXA) tablet 10 mg, 10 mg, Oral, Daily  enoxaparin (LOVENOX) injection 80 mg, 1 mg/kg, Subcutaneous, BID  eplerenone (INSPRA) tablet 50 mg, 50 mg, Oral, Daily  furosemide (LASIX) tablet 40 mg, 40 mg, Oral, Daily  lisinopril (PRINIVIL;ZESTRIL) tablet 5 mg, 5 mg, Oral, Nightly  magnesium oxide (MAG-OX) tablet 400 mg, 400 mg, Oral, Daily  rifaximin (XIFAXAN) tablet 550 mg, 550 mg, Oral, BID  traMADol (ULTRAM) tablet 50 mg, 50 mg, Oral, Q12H PRN  0.9 % sodium chloride infusion, , Intravenous, Continuous  sodium chloride flush 0.9 % injection 10 mL, 10 mL, Intravenous, 2 times per day  sodium chloride flush 0.9 % injection 10 mL, 10 mL, Intravenous, PRN  acetaminophen (TYLENOL) tablet 650 mg, 650 mg, Oral, Q4H PRN  HYDROcodone-acetaminophen (NORCO) 5-325 MG per tablet 1 tablet, 1 tablet, Oral, Q4H PRN **OR** HYDROcodone-acetaminophen (NORCO) 5-325 MG per tablet 2 tablet, 2 tablet, Oral, Q4H PRN  morphine (PF) injection 2 mg, 2 mg, Intravenous, Q2H PRN **OR** morphine (PF) injection 4 mg, 4 mg, Intravenous, Q2H PRN  magnesium hydroxide (MILK OF MAGNESIA) 400  MG/5ML suspension 30 mL, 30 mL, Oral, Daily PRN  ondansetron (ZOFRAN) injection 4 mg, 4 mg, Intravenous, Q6H PRN  potassium chloride SA (K-DUR;KLOR-CON M) tablet 40 mEq, 40 mEq, Oral, PRN **OR** potassium chloride 20 MEQ/15ML (10%) oral solution 40 mEq, 40 mEq, Oral, PRN **OR** potassium chloride 10 mEq/100 mL IVPB (Peripheral Line), 10 mEq, Intravenous, PRN         Objective:  BP 92/59 mmHg   Pulse 72   Temp(Src) 98.8 ??F  (37.1 ??C) (Temporal)   Resp 16   Ht 5' 5" (1.651 m)   Wt 176 lb 8 oz (80.06 kg)   BMI 29.37 kg/m2   SpO2 93%     Patient Vitals for the past 24 hrs:   BP Temp Temp src Pulse Resp SpO2 Weight   07/29/14 0809 92/59 mmHg 98.8 ??F (37.1 ??C) Temporal 72 16 93 % -   07/29/14 0724 - - - - - - 176 lb 8 oz (80.06 kg)   07/29/14 0645 (!) 86/49 mmHg - - - - - -   07/29/14 0630 (!) 83/50 mmHg 99.2 ??F (37.3 ??C) Temporal 73 18 96 % -   07/29/14 0545 91/55 mmHg 99.5 ??F (37.5 ??C) Temporal 72 16 96 % -   07/29/14 0114 101/63 mmHg 99 ??F (37.2 ??C) Temporal 95 16 97 % -   07/28/14 2055 101/62 mmHg 98.4 ??F (36.9 ??C) Temporal 65 16 96 % -   07/28/14 1834 94/56 mmHg 98 ??F (36.7 ??C) Temporal 64 16 95 % -   07/28/14 1645 99/61 mmHg 98.6 ??F (37 ??C) Temporal 64 16 96 % -   07/28/14 1545 99/62 mmHg 98.2 ??F (36.8 ??C) Temporal 67 16 95 % -   07/28/14 1500 92/56 mmHg 98.4 ??F (36.9 ??C) Temporal 67 16 - -   07/28/14 1452 92/60 mmHg 98.1 ??F (36.7 ??C) Temporal 68 18 94 % -   07/28/14 1443 100/65 mmHg 98.2 ??F (36.8 ??C) Temporal 67 16 95 % -   07/28/14 1045 97/63 mmHg 98.8 ??F (37.1 ??C) Temporal 67 18 94 % -     Patient Vitals for the past 96 hrs (Last 3 readings):   Weight   07/29/14 0724 176 lb 8 oz (80.06 kg)   07/28/14 0747 176 lb 14.4 oz (80.241 kg)   07/26/14 0715 171 lb 14.4 oz (77.973 kg)           Intake/Output Summary (Last 24 hours) at 07/29/14 0915  Last data filed at 07/29/14 0630   Gross per 24 hour   Intake   1835 ml   Output    850 ml   Net    985 ml         Physical Exam:   S1, S2 normal, no murmur, rub or gallop, regular rate and rhythm  clear to auscultation bilaterally  abdomen is soft without significant tenderness, masses, organomegaly or guarding  extremities normal, atraumatic, no cyanosis or edema    Labs:  Lab Results   Component Value Date    WBC 2.0* 07/28/2014    HGB 7.5* 07/28/2014    HCT 22.2* 07/28/2014    PLT 59* 07/28/2014    CHOL 101 04/02/2014    TRIG 83 04/02/2014    HDL 22* 04/02/2014    ALT 16 07/26/2014    AST 23  07/26/2014    NA 135* 07/27/2014    K 4.3 07/27/2014    CL 107 07/27/2014    CREATININE 1.1 07/27/2014  BUN 17 07/27/2014    CO2 20* 07/27/2014    TSH 2.15 08/15/2013    PSA 0.55 08/15/2013    INR 1.37* 07/25/2014    LABA1C 5.0 01/10/2014    LABMICR YES 07/25/2014     Lab Results   Component Value Date    TROPONINI <0.01 07/25/2014       Recent Imaging Results are Reviewed:  Xr Chest Standard Two Vw    07/27/2014   EXAMINATION: TWO VIEWS OF THE CHEST  07/27/2014 5:52 pm  COMPARISON: 07/25/2014  HISTORY: ORDERING SYSTEM PROVIDED HISTORY: follow up pneumonia TECHNOLOGIST PROVIDED HISTORY: Ordering Physician Provided Reason for Exam: follow up pneumonia Acuity: Acute Type of Exam: Initial  FINDINGS: Compression fracture of the lower thoracic spine is again seen.  Minimal right base atelectasis is noted.  There is scarring on the left.  Remote rib fractures are seen.  Cardiac silhouette within normal limits.  There is a hiatal hernia     07/27/2014   IMPRESSION: No acute disease     Xr Chest Standard Two Vw    07/25/2014   EXAMINATION: TWO VIEWS OF THE CHEST  07/25/2014 8:17 am  COMPARISON: Chest x-ray Jul 07, 2014  HISTORY: ORDERING SYSTEM PROVIDED HISTORY: generalized weakness, hypotension TECHNOLOGIST PROVIDED HISTORY: Ordering Physician Provided Reason for Exam: weakness Acuity: Unknown Type of Exam: Unknown  FINDINGS: There is new focal density of the right upper lobe above the hilum.  There also is new faint airspace disease of the lateral right lung base.  There is new focal opacity of the retrocardiac left lower lobe.  There is blunting of the left costophrenic angle consistent with pleural fluid.     07/25/2014   IMPRESSION: New bilateral multifocal airspace disease, most likely atelectasis or pneumonia although follow-up to ensure resolution is recommended.  New small to moderate size left pleural effusion.     Ct Head Wo Contrast    07/07/2014   EXAMINATION: CT OF THE HEAD WITHOUT CONTRAST  07/07/2014 11:11 pm   TECHNIQUE: CT of the head was performed without the administration of intravenous contrast.  COMPARISON: 04/01/2014  HISTORY: ORDERING SYSTEM PROVIDED HISTORY: dizzy TECHNOLOGIST PROVIDED HISTORY: Ordering Physician Provided Reason for Exam: dizzy  FINDINGS: BRAIN/VENTRICLES: There is no acute intracranial hemorrhage, mass effect or midline shift.  No abnormal extra-axial fluid collection.  The gray-white differentiation is maintained without evidence of an acute infarct.  There is no evidence of hydrocephalus.  ORBITS: The visualized portion of the orbits demonstrate no acute abnormality.  SINUSES: There is minimal mucosal thickening in the left maxillary sinus. The remaining visualized paranasal sinuses are clear.  The mastoid air cells are unremarkable.  SOFT TISSUES/SKULL:  No acute abnormality of the visualized skull or soft tissues.     07/07/2014   IMPRESSION: No acute intracranial abnormality.     Ct Chest W Contrast    07/26/2014   EXAMINATION: CT OF THE CHEST WITH CONTRAST 07/26/2014 2:14 pm  TECHNIQUE: CT of the chest was performed with the administration of intravenous contrast. Multiplanar reformatted images are provided for review. Dose modulation, iterative reconstruction, and/or weight based adjustment of the mA/kV was utilized to reduce the radiation dose to as low as reasonably achievable.  COMPARISON: 06/27/2014  HISTORY: ORDERING SYSTEM PROVIDED HISTORY: SHORTNESS OF BREATH  FINDINGS: Mediastinum: Stable mediastinal lymph nodes, no convincing adenopathy. Thoracic aorta normal in caliber.  No pericardial effusion.  .  Lungs/pleura: Interval decrease in right pleural effusion.  Stable trace left pleural effusion.  There  are multifocal airspace opacities that are predominantly bandlike in morphology, mostly in the lower lobes, right middle lobe, and lingula.  There are a cluster of tiny nodules in the lateral right upper lobe.  There is a stable 1.3 cm nodule in the superior segment of the right lower  lobe.  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  There is a tiny nonobstructing right renal stone.  Large venous collaterals are noted adjacent to the spleen  Soft Tissues/Bones: There is bilateral gynecomastia.  There are multiple thyroid nodules     07/26/2014   IMPRESSION: 1. Multifocal airspace opacities in the lower lobes, right middle lobe, and lingula have an appearance compatible with atelectasis, with or without pneumonia.  Cluster of nodular densities in the lateral right upper lobe most likely represents atypical infection.  1.3 cm nodule in the superior segment of the right lower lobe warrants six-month follow-up 2. Interval decrease in right pleural effusion 3. 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  LIVER: Evaluation of the liver is limited.  There is diffuse increased echogenicity with no focal lesion noted.  Somewhat nodular contour of the liver is compatible with known cirrhosis.  BILIARY SYSTEM: Limited imaging of the gallbladder is grossly unremarkable.  Common bile duct is within normal limits measuring 6 mm.  KIDNEYS: Right kidney is nonvisualized.  Left kidney measures 10.1 x 4.9 x 5.3 cm and is grossly unremarkable.  PANCREAS: Pancreas is nonvisualized.  SPLEEN: Spleen measures 10.1 cm.  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  IVC: The IVC is patent.  AORTA: Aorta is obscured.  OTHER: No evidence of ascites.     07/08/2014   IMPRESSION: Limited study as above.  Cirrhosis and portal hypertension.  No evidence of ascites.     Xr Chest Portable    07/08/2014   EXAMINATION: SINGLE VIEW OF THE CHEST   07/07/2014 11:34 pm  COMPARISON: 06/03/2014  HISTORY: ORDERING SYSTEM PROVIDED HISTORY: other TECHNOLOGIST PROVIDED HISTORY: Ordering Physician Provided Reason for Exam: dizziness Acuity: Unknown Type of Exam: Unknown  FINDINGS: Cardiac leads project over the chest.  Diffuse interstitial opacity is demonstrated bilaterally.  Heterogeneous opacity is seen adjacent to the left heart border.  No evidence of pleural effusion.  Negative for pneumothorax. Cardiac and mediastinal silhouettes are similar to prior.  Old posterior left 7th rib fracture.     07/08/2014   IMPRESSION: Mild pulmonary edema.     Vl Extremity Venous Right    07/10/2014   Lower Extremities DVT Study   Demographics    Patient Name      AMORE GRATER    Date of Study     07/09/2014          Gender              Male    Patient Number    [id number]          Date of Birth       1940-04-22    Visit Number      [id number]         Age  73 year(s)    Accession Number  [id number]           Room Number         6712    Corporate ID      [id number]            Sonographer         Erasmo Leventhal,                                                            RVS    Ordering          Deniece Portela,   Interpreting        [facility]  Physician         MD                  Physician           Peggyann Shoals,                                                            MD, [facility], [place]   Procedure  Type of Study:    Veins:Lower Extremities DVT Study, VL EXTREMITY VENOUS DUPLEX RIGHT.    Generic Orders:VL EXTREMITY VENOUS DUPLEX RIGHT.    Vascular Sonographer Report   Additional Indications:Swelling  Impressions Right Impression Acute totally occluding deep vein thrombosis involving the right proximal PTV and one gastroc vein zone 5-6. Acute partially occluding deep vein thrombosis involving the popliteal vein and right one gastroc vein zone 5-6. No other evidence of deep vein or superficial vein thrombosis involving the right lower extremity and the left common  femoral vein. Calf veins were not well visualized on the right due to patient positioning and edema.  Verbal to [employer].  Conclusions    Summary    Acute totally occluding deep vein thrombosis involving the right proximal  PTV and one gastroc vein zone 5-6.  Acute partially occluding deep vein thrombosis involving the popliteal vein  and right one gastroc vein zone 5-6.  Calf veins were not well visualized on the right due to patient positioning  and edema.    Signature    ------------------------------------------------------------------  Electronically signed by Peggyann Shoals, MD, [facility], [facility]  (Interpreting physician) on 07/10/2014 at 11:34 AM  ------------------------------------------------------------------   Patient Status:Routine. [facility] - Vascular Lab. Technical Quality:Poor visualization.  Risk Factors History +------------------+----------+----------------------------------------------+ !Diagnosis         !Date      !Comments                                      ! +------------------+----------+----------------------------------------------+ !Previous Procedure!06/27/2014!Venous Doppler Bilateral: Acute DVT right     ! !                  !          !gastroc veins.                                ! +------------------+----------+----------------------------------------------+  Velocities are measured in cm/s ; Diameters are measured in mm  Right Lower Extremities DVT Study Measurements Right 2D Measurements +------------------------+----------+---------------+----------+ !Location                !Visualized!Compressibility!Thrombosis! +------------------------+----------+---------------+----------+ !Sapheno Femoral Junction!Yes       !Yes            !None      ! +------------------------+----------+---------------+----------+ !GSV Thigh               !Yes       !Yes            !None      ! +------------------------+----------+---------------+----------+ !Common Femoral           !Yes       !Yes            !None      ! +------------------------+----------+---------------+----------+ !Prox Femoral            !Yes       !Yes            !None      ! +------------------------+----------+---------------+----------+ !Mid Femoral             !Yes       !Yes            !None      ! +------------------------+----------+---------------+----------+ !Dist Femoral            !Yes       !Yes            !None      ! +------------------------+----------+---------------+----------+ !Deep Femoral            !Yes       !Yes            !None      ! +------------------------+----------+---------------+----------+ !Popliteal               !Yes       !Partial        !Acute     ! +------------------------+----------+---------------+----------+ !GSV Below Knee          !Yes       !Yes            !None      ! +------------------------+----------+---------------+----------+ !Gastroc                 !Yes       !Partial        !Acute     ! +------------------------+----------+---------------+----------+ !PTV                     !Yes       !No             !Acute     ! +------------------------+----------+---------------+----------+ !Peroneal                !Yes       !Yes            !None      ! +------------------------+----------+---------------+----------+ !SSV                     !Yes       !Yes            !None      ! +------------------------+----------+---------------+----------+  Right Doppler Measurements +--------------+------+------+------------+ !Location      !Signal!Reflux!Reflux (sec)! +--------------+------+------+------------+ !Common Femoral!Phasic!      !            ! +--------------+------+------+------------+ !  Femoral       !Phasic!      !            ! +--------------+------+------+------------+ !Deep Femoral  !Phasic!      !            ! +--------------+------+------+------------+ !Popliteal     !Phasic!      !            ! +--------------+------+------+------------+  Left Lower Extremities  DVT Study Measurements Left 2D Measurements +------------------------+----------+---------------+----------+ !Location                !Visualized!Compressibility!Thrombosis! +------------------------+----------+---------------+----------+ !Sapheno Femoral Junction!Yes       !Yes            !None      ! +------------------------+----------+---------------+----------+ !Common Femoral          !Yes       !Yes            !None      ! +------------------------+----------+---------------+----------+  Left Doppler Measurements +--------------+------+------+------------+ !Location      !Signal!Reflux!Reflux (sec)! +--------------+------+------+------------+ !Common Femoral!Phasic!      !            ! +--------------+------+------+------------+    Vl Extremity Venous Bilateral    07/09/2014   Vascular Lower Extremities DVT Study Procedure  -- PRELIMINARY SONOGRAPHER REPORT --    Demographics    Patient Name      GURVEER COLUCCI    Date of Study     07/09/2014          Gender              Male    Patient Number    [id number]          Date of Birth       Jul 12, 1940    Visit Number      [id number]         Age                 69 year(s)    Accession Number  [id number]           Room Number         3748    Corporate ID      [id number]            Sonographer         Erasmo Leventhal,                                                            RVS    Ordering          Deniece Portela,   Interpreting        [facility]  Physician         MD                  Physician   Procedure  Type of Study:    Veins:Lower Extremities DVT Study, VL EXTREMITY VENOUS DUPLEX RIGHT.   Tech Comments Right Acute totally occluding deep vein thrombosis involving the right proximal PTV and one gastroc vein zone 5-6. Acute partially occluding deep vein thrombosis involving the popliteal vein and right one gastroc vein zone 5-6. No other evidence of deep vein or superficial vein thrombosis involving the right lower extremity  and the left common femoral vein.  Calf veins were not well visualized on the right due to patient positioning and edema.  verbal to [employer].      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

## 2014-07-29 NOTE — Plan of Care (Signed)
Problem: SAFETY  Goal: Free from accidental physical injury  Intervention: [place]  Patient free from harm. ID bands on, bed in lowest position, call light in reach. Patient instructed to call for help if needed. Patient educated on ambulation and safety.          Problem: Risk for Impaired Skin Integrity  Goal: Tissue integrity - skin and mucous membranes  Structural intactness and normal physiological function of skin and  mucous membranes.   Intervention: SKIN ASSESSMENT  Pt skin assessed. Pt skin cleaned and dried as needed.  Darla Lesches RN

## 2014-07-29 NOTE — Progress Notes (Signed)
Pt has Mews score of 4.  Low grade fever and hypotension.  Will page Dr. Tasia Catchings

## 2014-07-29 NOTE — Progress Notes (Signed)
First unit PRBC transfusing after verification with RN Catalina Antigua - RN at bedside x first 15 minutes as per protocol -wife also at bedside- now back to bed - sleeping- no distress- bed alarm on o

## 2014-07-29 NOTE — Progress Notes (Signed)
Physical Therapy  Facility/Department: [facility]  Daily Treatment Note  NAME: Andrew [name]  DOB: [birth year]  MRN: [id number]    Date of Service: 07/29/2014    Patient Diagnosis(es):   Patient Active Problem List    Diagnosis Date Noted   ??? Erectile dysfunction 04/30/2009     Priority: Low   ??? Pneumonia 07/25/2014   ??? 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 DVT (deep venous thrombosis) ([place]) 07/10/2014   ??? Hepatic encephalopathy ([place]) 07/07/2014   ??? Ataxia 07/07/2014   ??? Pancytopenia ([place]) 07/07/2014   ??? Benign essential HTN 07/03/2014   ??? Bronchiectasis without complication ([place]) 31/51/7616   ??? 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 Anemia 04/02/2014   ??? Hemispheric carotid artery syndrome    ??? CAD in native artery    ??? Hyperlipidemia    ??? Abnormal EKG 04/01/2014   ??? Visual changes 04/01/2014   ??? Lumbar compression fracture ([place]) 01/14/2014   ??? Chronic low back pain 01/14/2014   ??? Osteopenia 01/14/2014   ??? Coagulopathy ([place]) 01/14/2014   ??? Non-ischemic cardiomyopathy ([place]) 11/06/2013   ??? Chronic systolic CHF (congestive heart failure) ([place]) 10/18/2013   ??? Mild CAD    ??? Cirrhosis, nonalcoholic ([place])    ??? 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 BPH (benign prostatic hyperplasia) 08/01/2009   ??? Thrombocytopenia 04/30/2009   ??? Edema 04/30/2009       Past Medical History   Diagnosis Date   ??? Hypertension    ??? Kidney stones    ??? Alpha 1 antitrypsin deficiency    ??? Thrombocytopenia ([place]) 04/30/2009   ??? BPH (benign prostatic hyperplasia)    ??? 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 Chronic systolic CHF (congestive heart failure) ([place]) 10/18/2013   ??? CAD (coronary artery disease)      Non-obstructive   ??? Compression fracture of lumbar vertebra ([facility])      multiple, lumbar   ??? Leukopenia    ??? Anemia    ??? Pneumonia    ??? DVT (deep venous thrombosis) ([place])    ??? Cancer ([place])    ??? Hyperlipidemia    ??? Arthritis    ??? Glaucoma     ??? Liver disease    ??? 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 Av fistula repair  1970's   ??? Colonoscopy  4/12     5y   ??? Cardiac catheterization     ??? Eye surgery  04/12/2014     Laser Eye Surgery for glaucoma   ??? Cosmetic surgery  04/2013     eye lids   ??? Bone marrow biopsy     ??? Knee surgery         Restrictions  Restrictions/Precautions  Restrictions/Precautions: Fall Risk (High Fall Risk)  Required Braces or Orthoses?: No  Position Activity Restriction  Other position/activity restrictions: up as tolerated; pt's hgb 7.5, RN approval prior to approach  Subjective   General  Chart Reviewed: Yes  Additional Pertinent Hx: Myelodysplasia, HTN, cirrhosis, chronic systolic CHF, CAD, cancer  Response To Previous Treatment: Patient with no complaints from previous session.  Family / Caregiver Present: Yes (wife)  Subjective  Subjective: Pt agreeable to PT treatment this AM. Reports he needs to use the commode.   General Comment  Comments: Pt supine  in bed and pulling off hospital gown upon arrival. Pt's wife reports pt has been pulling off his gown all morning. Per chart, pt's recent BP in supine 92/59. Lethargic throughout treatment session. Requiring repitition and tactile cues for commands.   Pain Screening  Patient Currently in Pain: Denies  Vital Signs  BP: (!) 81/50 mmHg  BP Location: Left upper arm  MAP (mmHg): 60  Patient Currently in Pain: Denies  Oxygen Therapy  SpO2: 98 %  Pulse Oximeter Device Mode: Intermittent  Pulse Oximeter Device Location: Finger  O2 Device: None (Room air)     RN and MD aware of low BP.     Orientation  Orientation  Overall Orientation Status: Within Functional Limits (increased lethargy)  Objective   Bed Mobility  Supine to Sit: Contact guard assistance (with HOB fully elevated)  Sit to Supine: Unable to assess (comment) (Pt sitting in chair at end of treatment)  Scooting: Moderate assistance (to scoot hips to  EOB)  Transfers  Sit to Stand: Minimal Assistance (from EOB and from commode. Pt requiring increased time to complete transfer with verbal and tactile cues for hand placement)  Stand to sit: Minimal Assistance (Pt requiring increased time to perform transfer, VC/TCs needed for hand placement. Pt with difficulty reaching back for armrests)  Bed to Chair: Minimal assistance (Bed to commode and commode to chair with RW)  Ambulation  Ambulation?: Yes  Ambulation 1  Surface: level tile  Device: Rolling Walker  Assistance: Minimal assistance  Quality of Gait: Pt presents with shuffling steps, decreased step length bilaterally (L less than R), forward flexed posture, decreased gait speed. Pt requiring frequent verbal and tactile cues to step forward and how to position self prior to transfer.   Distance: 82ft x2 (bed to commode; comoode to chair)  Comments: Increased time to perform (~2-3 minutes per stand step transfers)  Stairs/Curb  Stairs?: No     Balance  Posture: Fair  Sitting - Static: Good  Sitting - Dynamic:  (Good -)  Standing - Static: Fair  Standing - Dynamic: Fair  Comments: Pt requiring min A for static and dynamic standing balance at rolling walker. No LOB.       Comment: Pt's BP per chart in supine 92/59, sitting up in chair BP 81/50 (MAP 60), RN called and MD walking into room. PT notified MD, MD reports pt will be receiving pRBCs, okayed pt to remain seated. PT elevated pt's LEs and re-assessed BP. BP 77/48 (MAP 58). RN and MD notified again. MD okays pt remaining seated in chair and states "Just have him stay there, he will be receiving blood soon." Pt denies any lightheadedness. Pt falling asleep in chair. All needs within reach, wife in room, alarm set, and RN notified.        Assessment   Assessment: Decreased functional mobility ;Decreased strength;Decreased safe judgement ;Decreased endurance;Decreased balance  Assessment: Pt currently not at baseline functioning related to recent dx and would  benefit from skilled PT intervention to address stated deficits and facilitate return to PLOF. Pt currently not safe to discharge home and is at an increased risk of falls. He demonstrates decreased endurance today due to low hgb and low BP. Continue to recommend SNF upon discharge to progress PT POC. If pt   Treatment Diagnosis: decreased functional mobility, decreased strength, decreased safe judgement, decreased endurance, decreased balance   Prognosis: Fair  Requires PT Follow Up: Yes  Total Treatment Time: 42  Timed Code Treatment Minutes: 42  Minutes  Activity Tolerance  Activity Tolerance: Patient limited by fatigue  Comments: Pt limited by fatigue and low BP  PT D/C Equipment  Equipment Needed:  (TBD by next level of care)       Discharge Recommendations:  [place], Patient would benefit from additional therapy (If patient demonstrates improved endurance with improved hgb he may benefit from IP rehab given his motivation to return to PLOF and current need of min A for all mobility. )    Goals  Short term goals  Time Frame for Short term goals: to be met prior to discharge   Short term goal 1: Pt to complete all bed mobility with MOD I.   Short term goal 2: Pt to complete all transfers with MOD I.   Short term goal 3: Pt ambulate 150' with use of least restrictive assistive device and SUPV.   Short term goal 4: Pt to negotiate 1 flight of stairs with handrails and CGA.   No goals met this treatment.     Plan    Plan  Times per week: 2-5  Times per day: Daily  Current Treatment Recommendations: Strengthening;Balance Training;Functional Mobility Training;Endurance Training;Gait Heritage manager;Safety Education & Training;Patient/Caregiver Education & Training  Patient Education: Educated pt on role of PT, POC, and discharge recommendations.   Safety Devices  Safety Devices in place: Yes  Type of devices: All fall risk precautions in place;Patient at  risk for falls;Call light within reach;Left in chair;Chair alarm in place;Nurse notified;Gait belt  Restraints  Initially in place: No     Katina Degree, PT  License and [place] Number: Rosalie Gums, [place], DPT [phone]

## 2014-07-29 NOTE — Progress Notes (Signed)
Discharge event mistakenly placed on chart- pt denied for ARU and wife made aware d/t insurance non approval - acknowledged understanding- pt not discharged today - still receiving 2nd unit PRBC as per orders- call light in reach - chair alarm on

## 2014-07-29 NOTE — Plan of Care (Signed)
Problem: Breathing Pattern - Ineffective:  Goal: Ability to achieve and maintain a regular respiratory rate will improve  Ability to achieve and maintain a regular respiratory rate will improve  Outcome: Ongoing  Drowsy oriented x 3 but forgetful at times- lungs CTA- no edema - vitals stable 92/59 blood pressure at this time- wife at bedside- 02 sat 93 % on room air -respirations easy -patent airway maintained

## 2014-07-29 NOTE — Plan of Care (Signed)
Problem: Activity:  Goal: Ability to tolerate increased activity will improve  Ability to tolerate increased activity will improve  Outcome: Ongoing  Alert and oriented x 4- forgetful at times-up to chair with use of walker and OT assist- muscle strength improved with ambulation in room and to [facility] - BM and void with OT assist- now in chair- chair alarm on - call light in reach -much family at chairside

## 2014-07-29 NOTE — Progress Notes (Signed)
After 15 minutes PRBC infusing - no significant assessment changes- vitals stable- rate increased to 100 ml/hr (hx CHF) - wife at bedside- bed alarm on - call light in reach

## 2014-07-29 NOTE — Progress Notes (Signed)
Assessment complete, see doc flowsheet. Patient resting in bed, call light in reach, bed in lowest position, brake set. Patient denies any needs at this time. Patient encouraged to call if needs arise.  Pat Jefferson Fullam RN

## 2014-07-29 NOTE — Progress Notes (Signed)
2nd unit PRBC infusing- after 15 minutes transfusing vitals stable- sleeping-02 sat 96% on room air - wife at bedside- rate increased to 100 ml/hr - (hx CHF) - call light in reach

## 2014-07-29 NOTE — Progress Notes (Signed)
2nd unit PRBC infusing with no significant assessment changes- much family at chairside- call light in reach - chair alarm on

## 2014-07-29 NOTE — Progress Notes (Signed)
2nd unit PRBC now completed- vitals stable- resting in bed no distress- family at bedside- call light in reach

## 2014-07-29 NOTE — Progress Notes (Signed)
Up to [facility] with RN assist and use of walker- loose BM - peri care and then ambulated room to bed with use of walker and standby assist- now in bed- PRBC infusing almost finished - bed alarm on - call light in reach - family at bedside

## 2014-07-29 NOTE — Progress Notes (Signed)
Mews score of 4 was a data entry error, Mews score is 2.  Will continue to monitor

## 2014-07-29 NOTE — Progress Notes (Signed)
2 hours PRBC infusing no significant assessment changes- sleeping- no distress- wife at bedside- call light in reach

## 2014-07-29 NOTE — Progress Notes (Signed)
1st unit PRBC now completed with no significant adverse assessment- blood pressure improved now - afebrile- called for 2nd unit -wife at bedside call light in reach

## 2014-07-30 ENCOUNTER — Inpatient Hospital Stay: Attending: Critical Care Medicine | Primary: Internal Medicine

## 2014-07-30 ENCOUNTER — Encounter: Primary: Internal Medicine

## 2014-07-30 ENCOUNTER — Inpatient Hospital Stay: Admit: 2014-07-30 | Payer: MEDICARE | Primary: Internal Medicine

## 2014-07-30 LAB — CBC WITH AUTO DIFFERENTIAL
Bands Relative: 5 % (ref 0–7)
Basophils %: 0 %
Basophils Absolute: 0 10*3/uL (ref 0.0–0.2)
Eosinophils %: 1 %
Eosinophils Absolute: 0 10*3/uL (ref 0.0–0.6)
Hematocrit: 21 % — ABNORMAL LOW (ref 40.5–52.5)
Hemoglobin: 7 g/dL — ABNORMAL LOW (ref 13.5–17.5)
Lymphocytes %: 61 %
Lymphocytes Absolute: 1.7 10*3/uL (ref 1.0–5.1)
MCH: 33.4 pg (ref 26.0–34.0)
MCHC: 33.3 g/dL (ref 31.0–36.0)
MCV: 100.1 fL — ABNORMAL HIGH (ref 80.0–100.0)
MPV: 8.6 fL (ref 5.0–10.5)
Monocytes %: 2 %
Monocytes Absolute: 0.1 10*3/uL (ref 0.0–1.3)
Neutrophils %: 31 %
Neutrophils Absolute: 1 10*3/uL — ABNORMAL LOW (ref 1.7–7.7)
PLATELET SLIDE REVIEW: DECREASED
Platelets: 45 10*3/uL — ABNORMAL LOW (ref 135–450)
RBC: 2.1 M/uL — ABNORMAL LOW (ref 4.20–5.90)
RDW: 22.7 % — ABNORMAL HIGH (ref 12.4–15.4)
WBC: 2.8 10*3/uL — ABNORMAL LOW (ref 4.0–11.0)

## 2014-07-30 LAB — BLOOD OCCULT STOOL SCREEN #1: Occult Blood Screening: NEGATIVE

## 2014-07-30 LAB — CULTURE, BLOOD 2: Culture, Blood 2: NO GROWTH

## 2014-07-30 LAB — HEMOGLOBIN AND HEMATOCRIT
Hematocrit: 20.7 % — CL (ref 40.5–52.5)
Hemoglobin: 6.9 g/dL — CL (ref 13.5–17.5)

## 2014-07-30 LAB — BASIC METABOLIC PANEL
Anion Gap: 5 (ref 3–16)
BUN: 23 mg/dL — ABNORMAL HIGH (ref 7–20)
CO2: 22 mmol/L (ref 21–32)
Calcium: 7.8 mg/dL — ABNORMAL LOW (ref 8.3–10.6)
Chloride: 109 mmol/L (ref 99–110)
Creatinine: 0.9 mg/dL (ref 0.8–1.3)
GFR African American: >60 (ref >60)
GFR Non-African American: >60 (ref >60)
Glucose: 121 mg/dL — ABNORMAL HIGH (ref 70–99)
Potassium: 5 mmol/L (ref 3.5–5.1)
Sodium: 136 mmol/L (ref 136–145)

## 2014-07-30 LAB — AMMONIA: Ammonia: 42 umol/L (ref 16–60)

## 2014-07-30 LAB — HAPTOGLOBIN: Haptoglobin: <10 mg/dL — ABNORMAL LOW (ref 30.0–200.0)

## 2014-07-30 LAB — CULTURE BLOOD #1: Blood Culture, Routine: NO GROWTH

## 2014-07-30 MED ORDER — IOHEXOL 240 MG/ML IJ SOLN
240 MG/ML | Freq: Once | INTRAMUSCULAR | Status: AC | PRN
Start: 2014-07-30 — End: 2014-07-30
  Administered 2014-07-30: 13:00:00 50 mL via INTRAVENOUS

## 2014-07-30 MED ORDER — SODIUM CHLORIDE 0.9 % IV BOLUS
0.9 % | Freq: Once | INTRAVENOUS | Status: AC
Start: 2014-07-30 — End: 2014-07-31
  Administered 2014-07-30: 20:00:00 250 mL via INTRAVENOUS

## 2014-07-30 MED ORDER — IOPAMIDOL 76 % IV SOLN
76 % | Freq: Once | INTRAVENOUS | Status: AC | PRN
Start: 2014-07-30 — End: 2014-07-30
  Administered 2014-07-30: 16:00:00 100 mL via INTRAVENOUS

## 2014-07-30 MED FILL — SODIUM CHLORIDE 0.9 % IV SOLN: 0.9 % | INTRAVENOUS | Qty: 1000

## 2014-07-30 MED FILL — ISOVUE-370 76 % IV SOLN: 76 % | INTRAVENOUS | Qty: 100

## 2014-07-30 MED FILL — SODIUM CHLORIDE 0.9 % IV SOLN: 0.9 % | INTRAVENOUS | Qty: 250

## 2014-07-30 MED FILL — FUROSEMIDE 40 MG PO TABS: 40 MG | ORAL | Qty: 1

## 2014-07-30 MED FILL — OMNIPAQUE 240 MG/ML IJ SOLN: 240 MG/ML | INTRAMUSCULAR | Qty: 50

## 2014-07-30 MED FILL — XIFAXAN 550 MG PO TABS: 550 MG | ORAL | Qty: 1

## 2014-07-30 MED FILL — CITALOPRAM HYDROBROMIDE 20 MG PO TABS: 20 MG | ORAL | Qty: 1

## 2014-07-30 MED FILL — LISINOPRIL 5 MG PO TABS: 5 MG | ORAL | Qty: 1

## 2014-07-30 MED FILL — MAGNESIUM OXIDE 400 (241.3 MG) MG PO TABS: 400 (241.3 Mg) MG | ORAL | Qty: 1

## 2014-07-30 MED FILL — VITAMIN D3 25 MCG (1000 UT) PO TABS: 25 MCG (1000 UT) | ORAL | Qty: 1

## 2014-07-30 MED FILL — AMOXICILLIN-POT CLAVULANATE 875-125 MG PO TABS: 875-125 MG | ORAL | Qty: 1

## 2014-07-30 MED FILL — LOVENOX 80 MG/0.8ML SC SOLN: 80 MG/0.8ML | SUBCUTANEOUS | Qty: 0.8

## 2014-07-30 MED FILL — NORMAL SALINE FLUSH 0.9 % IV SOLN: 0.9 % | INTRAVENOUS | Qty: 10

## 2014-07-30 MED FILL — CARVEDILOL 6.25 MG PO TABS: 6.25 MG | ORAL | Qty: 1

## 2014-07-30 MED FILL — EPLERENONE 25 MG PO TABS: 25 MG | ORAL | Qty: 2

## 2014-07-30 NOTE — Progress Notes (Signed)
1 hour 1st unit PRBC infusing- vitals with SBP improving now 96/64- no significant adverse changes in assessment- family at bedside- call light in reach

## 2014-07-30 NOTE — Progress Notes (Signed)
Called Dr Tasia Catchings regarding abnormal CT results-detailed message left with office regarding abnormal CT results

## 2014-07-30 NOTE — Progress Notes (Signed)
To CT by bed on room air with transport for testing

## 2014-07-30 NOTE — Progress Notes (Signed)
Dr. Tasia Catchings called to notify RN that he read CT and ordered surgery consult.

## 2014-07-30 NOTE — Progress Notes (Signed)
Occupational Therapy  Daily Treatment Note  Date: 07/30/2014    Patient Name: Andrew [name]  ZSW:[id number]     DOB: Aug 15, 1940     Restrictions  Restrictions/Precautions  Restrictions/Precautions: Fall Risk  Required Braces or Orthoses?: No  Position Activity Restriction  Other position/activity restrictions:  RN Debbie approval prior to approach  Subjective   General  Chart Reviewed: Yes  Additional Pertinent Hx: HTN, kidney stones, cirrhosis, leukopenia, anemia, h/o DVT, cancer, glaucoma, liver disease, myelodysplasia, bone marrow biopsy  Response to previous treatment: Patient with no complaints from previous session  Family / Caregiver Present: Yes  Diagnosis: Pneumonia  Subjective  Subjective: Patient supine in bed at arrival, agreeable to tx. Pt's wife and RN present, RN Debbie approval prior to tx.       Orientation     Objective    ADL  UE Bathing: Verbal cueing;Increased time to complete;Setup;Minimal assistance  LE Bathing: Moderate assistance - pt washed to just below knees  UE Dressing: Moderate assistance- hospital gown  LE Dressing: Dependent/Total- socks  Transfer: Minimal assistance  Comment: Seated EOB pt required SBA for safety and multiple cues for task completion as pt perseverated on washing his face.  Pt also needed cues for keeping feet on the floor during EOB sitting as he kept lifting feet up making his sitting balance precarious.  Bathing/Dressing (see above for assist levels).  Extended time for activity.         Balance  Sitting Balance: Stand by assistance  Standing Balance: Contact guard assistance  Standing Balance  Time: ~ 2 minutes   Activity: transfers EOB to recliner  Sit to stand: Contact guard assistance  Stand to sit: Contact guard assistance  Comment: stand step ~ 3',  RW, multiple cues for taking larger steps and assist for walker management  Functional Mobility  Functional - Mobility Device: Rolling Walker  Activity: Other  Assist Level: Minimal assistance  Functional  Mobility Comments: RW management, 3' EOB to recliner, cues for taking larger steps, cues for hand placement with sit<>stand  Toilet Transfers  Toilet Transfers Comments: Pt declined need for toileting   Bed mobility  Rolling: Minimal assistance  Supine to Sit: Minimal assistance  Bed to Chair: Minimal assistance  Comment: Bed mobility with extended time and cues.  HOB bed initially  Upright, but then lowered, pt used bedrail for assist.  Cues for EOB sitting as pt kept lifting feet off floor decreasing sitting balance.   Transfers  Stand Step Transfers: Minimal assistance  Sit to stand: Contact guard assistance  Stand to sit: Contact guard assistance  Comment:  RW, stand step ~3', multiple cues for larger steps and assist to manage walker, cues for hand placement.     Comment: Pt left in recliner, chair alarm set, call light and needs in reach, wife remains present. RN Debbie aware.   Pt tolerated well, but appears fatigued and in need of multiple cues and extended time.                                                               Assessment   Assessment: Decreased functional mobility ;Decreased functional activity tolerance;Decreased ADL status;Decreased cognition  Comments: Patient is not at baseline level following admission due to pneumonia. Palliative care consult pending. Wife  reports has 24/7 care at home but reports patient with increased weakness, wife agreeable to SNF  Treatment Diagnosis: Debility and decreased ADLs following pneumonia  Rehab Potential: Fair;Guarded  Recommendations: SNF consult;Patient would benefit from continued therapy after discharge  Goal Formulation: Patient  Requires OT Follow Up: Yes  Total Treatment Time: 43  Timed Code Treatment Minutes: 43 Minutes  Response to Treatment  Response to Treatment: Patient Tolerated treatment well;Patient limited by fatigue  Safety Devices  Safety Devices in place: Yes  Type of devices: Patient at risk for falls;All fall risk precautions in  place;Left in chair;Call light within reach;Chair alarm in place;Nurse notified  Restraints  Initially in place: No  OT D/C Equipment  Equipment Needed: Yes  Other: Recommend a RW and shower chair          Discharge Recommendations:  SNF consult, Patient would benefit from continued therapy after discharge     Plan   Plan  Times per week: 2-5  Times per day: Daily  Patient Education: POC, functional mobility, ADLs, bed mobility, walker management and safety, transfers, safety,- pt demonstrated understanding     G-Code       Short term goals  Time Frame for Short term goals: until discharge  Short term goal 1: Min assist bathing- MOD A, pt washed to just below knees, multiple cues for task completion--goal not met; ongoing  Short term goal 2: Min assist dressing- MOD A hospital gown, DEP for socks--Goal not met; ongoing  Short term goal 3: CGA funtional mobility with RW or with 1 hand held assist-MIN A, RW, stand step ~3', multiple cues and assist w/ RW management--Goal not met; ongong  Short term goal 4: CGA toileting- Goal not addressed today, pt declined   Long term goals  Time Frame for Long term goals : STGs= [place], OTA  License and Documentation Cosign  Therapy License Number: Tawanna Sat,  [place]   Cosign: I have read and approve of this note. Dellia Beckwith, OTR/L, [id number]

## 2014-07-30 NOTE — Progress Notes (Signed)
Am meds given - po coreg and lovenox held this am - blood pressure 95/57- RBC count and platelet count low- omnipaque dye started - OT at bedside -wife at bedside

## 2014-07-30 NOTE — Progress Notes (Signed)
Progress Note - Dr. Tasia Catchings - Internal Medicine  PCP: Alecia Lemming, DO [place] / [place] [address] [phone]    Hospital Day: 5  Code Status: Full Code  Current Diet: DIET GENERAL; No Added Salt (3-4 GM)        CC: follow up on medical issues    Subjective:   Andrew [name] is a 74 y.o. male.    He denies problems    Pt looks worse  Very lethargic  Has to be constantly aroused over 5 mnute period that i try to interview him  Denies any issues  However, hgb cont to stay low, despite 2u yest  D/w dr Liston Alba at length    Rehab consult made - as before, his insurance likely will not approve. Also, at this time, not sure pt can do 3hr therapy/day    He denies chest pain, complains of shortness of breath, denies nausea,  denies emesis.    10 system Review of Systems is reviewed with patient, and pertinent positives are listed here: None . Otherwise, Review of systems is negative.     I have reviewed the patient's medical and social history in detail and updated the computerized patient record.  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 He  has past surgical history that includes AV fistula repair (1970's); Colonoscopy (4/12); Cardiac catheterization; eye surgery (04/12/2014); Cosmetic surgery (04/2013); bone marrow biopsy; and knee surgery.[name] He  reports that he has never smoked. He has never used smokeless tobacco. He reports that he does not drink alcohol or use illicit drugs..        Active Hospital Problems    Diagnosis Date Noted   ???  Pneumonia [J18.9] 07/25/2014   ??? 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 Hepatic encephalopathy ([place]) [K72.90] 07/07/2014   ??? Benign essential HTN [I10] 07/03/2014       Current facility-administered medications: lactulose (CHRONULAC) 10 GM/15ML solution 20 g, 20 g, Oral, TID  amoxicillin-clavulanate (AUGMENTIN) 875-125 MG per tablet 1 tablet, 1 tablet, Oral, 2 times per day  ioversol (OPTIRAY) 68 % injection 100 mL, 100 mL, Intravenous, ONCE PRN  sodium chloride (PF) 0.9 % injection 10 mL, 10 mL, Intravenous, PRN  carvedilol (COREG) tablet 6.25 mg, 6.25 mg, Oral, BID WC  vitamin D (CHOLECALCIFEROL) tablet 1,000 Units, 1,000 Units, Oral, Daily  citalopram (CELEXA) tablet 10 mg, 10 mg, Oral, Daily  enoxaparin (LOVENOX) injection 80 mg, 1 mg/kg, Subcutaneous, BID  eplerenone (INSPRA) tablet 50 mg, 50 mg, Oral, Daily  furosemide (LASIX) tablet 40 mg, 40 mg, Oral, Daily  lisinopril (PRINIVIL;ZESTRIL) tablet 5 mg, 5 mg, Oral, Nightly  magnesium oxide (MAG-OX) tablet 400 mg, 400 mg, Oral, Daily  rifaximin (XIFAXAN) tablet 550 mg, 550 mg, Oral, BID  traMADol (ULTRAM) tablet 50 mg, 50 mg, Oral, Q12H PRN  0.9 % sodium chloride infusion, , Intravenous, Continuous  sodium chloride flush 0.9 % injection 10 mL, 10 mL, Intravenous, 2 times per day  sodium chloride flush 0.9 % injection 10 mL, 10 mL, Intravenous, PRN  acetaminophen (TYLENOL) tablet 650 mg, 650 mg, Oral, Q4H PRN  HYDROcodone-acetaminophen (NORCO) 5-325 MG per tablet 1  tablet, 1 tablet, Oral, Q4H PRN **OR** HYDROcodone-acetaminophen (NORCO) 5-325 MG per tablet 2 tablet, 2 tablet, Oral, Q4H PRN  morphine (PF) injection 2 mg, 2 mg, Intravenous, Q2H PRN **OR** morphine (PF) injection 4 mg, 4 mg, Intravenous, Q2H PRN  magnesium hydroxide (MILK OF MAGNESIA) 400 MG/5ML suspension 30 mL, 30 mL, Oral, Daily PRN  ondansetron (ZOFRAN) injection 4 mg, 4 mg, Intravenous, Q6H PRN  potassium chloride SA (K-DUR;KLOR-CON M) tablet 40 mEq, 40 mEq,  Oral, PRN **OR** potassium chloride 20 MEQ/15ML (10%) oral solution 40 mEq, 40 mEq, Oral, PRN **OR** potassium chloride 10 mEq/100 mL IVPB (Peripheral Line), 10 mEq, Intravenous, PRN         Objective:  BP 98/61 mmHg   Pulse 78   Temp(Src) 98.4 ??F (36.9 ??C) (Temporal)   Resp 16   Ht $R'5\' 5"'FG$  (1.651 m)   Wt 176 lb 8 oz (80.06 kg)   BMI 29.37 kg/m2   SpO2 98%     Patient Vitals for the past 24 hrs:   BP Temp Temp src Pulse Resp SpO2   07/30/14 0530 98/61 mmHg 98.4 ??F (36.9 ??C) Temporal 78 16 -   07/30/14 0045 (!) 88/58 mmHg 98.8 ??F (37.1 ??C) Temporal 83 16 98 %   07/29/14 2030 104/65 mmHg 98.2 ??F (36.8 ??C) Temporal 68 16 97 %   07/29/14 1749 99/57 mmHg 98.4 ??F (36.9 ??C) Temporal 73 16 100 %   07/29/14 1708 98/63 mmHg 97.8 ??F (36.6 ??C) Temporal 71 16 -   07/29/14 1624 98/64 mmHg 98.5 ??F (36.9 ??C) Temporal 72 16 94 %   07/29/14 1453 108/72 mmHg 98 ??F (36.7 ??C) Temporal 70 16 96 %   07/29/14 1439 103/68 mmHg 97.7 ??F (36.5 ??C) Temporal 70 16 94 %   07/29/14 1422 97/61 mmHg 98 ??F (36.7 ??C) Temporal 71 16 96 %   07/29/14 1357 99/66 mmHg 98.2 ??F (36.8 ??C) Temporal 74 16 96 %   07/29/14 1304 97/61 mmHg 98.4 ??F (36.9 ??C) Temporal 70 16 94 %   07/29/14 1212 94/59 mmHg 98 ??F (36.7 ??C) Temporal 70 16 95 %   07/29/14 1117 (!) 77/48 mmHg 98.2 ??F (36.8 ??C) Temporal 71 16 96 %   07/29/14 1102 (!) 81/49 mmHg 98.2 ??F (36.8 ??C) Temporal 70 16 98 %   07/29/14 1049 (!) 85/51 mmHg 98.6 ??F (37 ??C) Temporal 73 16 96 %   07/29/14 0930 (!) 81/50 mmHg - - - - 98 %   07/29/14 0809 92/59 mmHg 98.8 ??F (37.1 ??C) Temporal 72 16 93 %     Patient Vitals for the past 96 hrs (Last 3 readings):   Weight   07/29/14 0724 176 lb 8 oz (80.06 kg)   07/28/14 0747 176 lb 14.4 oz (80.241 kg)           Intake/Output Summary (Last 24 hours) at 07/30/14 0744  Last data filed at 07/29/14 1749   Gross per 24 hour   Intake 1907.5 ml   Output      0 ml   Net 1907.5 ml         Physical Exam:   S1, S2 normal, no murmur, rub or gallop, regular rate and rhythm  clear to  auscultation bilaterally  abdomen is soft without significant tenderness, masses, organomegaly or guarding  extremities normal, atraumatic, no cyanosis or edema    Labs:  Lab Results   Component Value Date    WBC 2.8* 07/30/2014    HGB 7.0* 07/30/2014  HCT 21.0* 07/30/2014    PLT 59* 07/28/2014    CHOL 101 04/02/2014    TRIG 83 04/02/2014    HDL 22* 04/02/2014    ALT 16 07/26/2014    AST 23 07/26/2014    NA 136 07/30/2014    K 5.0 07/30/2014    CL 109 07/30/2014    CREATININE 0.9 07/30/2014    BUN 23* 07/30/2014    CO2 22 07/30/2014    TSH 2.15 08/15/2013    PSA 0.55 08/15/2013    INR 1.37* 07/25/2014    LABA1C 5.0 01/10/2014    LABMICR YES 07/25/2014     Lab Results   Component Value Date    TROPONINI <0.01 07/25/2014       Recent Imaging Results are Reviewed:  Xr Chest Standard Two Vw    07/27/2014   EXAMINATION: TWO VIEWS OF THE CHEST  07/27/2014 5:52 pm  COMPARISON: 07/25/2014  HISTORY: ORDERING SYSTEM PROVIDED HISTORY: follow up pneumonia TECHNOLOGIST PROVIDED HISTORY: Ordering Physician Provided Reason for Exam: follow up pneumonia Acuity: Acute Type of Exam: Initial  FINDINGS: Compression fracture of the lower thoracic spine is again seen.  Minimal right base atelectasis is noted.  There is scarring on the left.  Remote rib fractures are seen.  Cardiac silhouette within normal limits.  There is a hiatal hernia     07/27/2014   IMPRESSION: No acute disease     Xr Chest Standard Two Vw    07/25/2014   EXAMINATION: TWO VIEWS OF THE CHEST  07/25/2014 8:17 am  COMPARISON: Chest x-ray Jul 07, 2014  HISTORY: ORDERING SYSTEM PROVIDED HISTORY: generalized weakness, hypotension TECHNOLOGIST PROVIDED HISTORY: Ordering Physician Provided Reason for Exam: weakness Acuity: Unknown Type of Exam: Unknown  FINDINGS: There is new focal density of the right upper lobe above the hilum.  There also is new faint airspace disease of the lateral right lung base.  There is new focal opacity of the retrocardiac left lower lobe.  There is  blunting of the left costophrenic angle consistent with pleural fluid.     07/25/2014   IMPRESSION: New bilateral multifocal airspace disease, most likely atelectasis or pneumonia although follow-up to ensure resolution is recommended.  New small to moderate size left pleural effusion.     Ct Head Wo Contrast    07/07/2014   EXAMINATION: CT OF THE HEAD WITHOUT CONTRAST  07/07/2014 11:11 pm  TECHNIQUE: CT of the head was performed without the administration of intravenous contrast.  COMPARISON: 04/01/2014  HISTORY: ORDERING SYSTEM PROVIDED HISTORY: dizzy TECHNOLOGIST PROVIDED HISTORY: Ordering Physician Provided Reason for Exam: dizzy  FINDINGS: BRAIN/VENTRICLES: There is no acute intracranial hemorrhage, mass effect or midline shift.  No abnormal extra-axial fluid collection.  The gray-white differentiation is maintained without evidence of an acute infarct.  There is no evidence of hydrocephalus.  ORBITS: The visualized portion of the orbits demonstrate no acute abnormality.  SINUSES: There is minimal mucosal thickening in the left maxillary sinus. The remaining visualized paranasal sinuses are clear.  The mastoid air cells are unremarkable.  SOFT TISSUES/SKULL:  No acute abnormality of the visualized skull or soft tissues.     07/07/2014   IMPRESSION: No acute intracranial abnormality.     Ct Chest W Contrast    07/26/2014   EXAMINATION: CT OF THE CHEST WITH CONTRAST 07/26/2014 2:14 pm  TECHNIQUE: CT of the chest was performed with the administration of intravenous contrast. Multiplanar reformatted images are provided for review. Dose modulation, iterative reconstruction, and/or weight based  adjustment of the mA/kV was utilized to reduce the radiation dose to as low as reasonably achievable.  COMPARISON: 06/27/2014  HISTORY: ORDERING SYSTEM PROVIDED HISTORY: SHORTNESS OF BREATH  FINDINGS: Mediastinum: Stable mediastinal lymph nodes, no convincing adenopathy. Thoracic aorta normal in caliber.  No pericardial effusion.  .   Lungs/pleura: Interval decrease in right pleural effusion.  Stable trace left pleural effusion.  There are multifocal airspace opacities that are predominantly bandlike in morphology, mostly in the lower lobes, right middle lobe, and lingula.  There are a cluster of tiny nodules in the lateral right upper lobe.  There is a stable 1.3 cm nodule in the superior segment of the right lower lobe.  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  There is a tiny nonobstructing right renal stone.  Large venous collaterals are noted adjacent to the spleen  Soft Tissues/Bones: There is bilateral gynecomastia.  There are multiple thyroid nodules     07/26/2014   IMPRESSION: 1. Multifocal airspace opacities in the lower lobes, right middle lobe, and lingula have an appearance compatible with atelectasis, with or without pneumonia.  Cluster of nodular densities in the lateral right upper lobe most likely represents atypical infection.  1.3 cm nodule in the superior segment of the right lower lobe warrants six-month follow-up 2. Interval decrease in right pleural effusion 3. 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  LIVER: Evaluation of the liver is limited.  There is diffuse increased echogenicity with no focal lesion noted.  Somewhat nodular contour of the liver is compatible with known cirrhosis.  BILIARY SYSTEM: Limited imaging of the gallbladder is grossly unremarkable.  Common bile duct is within normal limits measuring 6 mm.  KIDNEYS: Right kidney is nonvisualized.  Left kidney measures 10.1 x 4.9 x 5.3 cm and is grossly  unremarkable.  PANCREAS: Pancreas is nonvisualized.  SPLEEN: Spleen measures 10.1 cm.  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  IVC: The IVC is patent.  AORTA: Aorta is obscured.  OTHER: No evidence of ascites.     07/08/2014   IMPRESSION: Limited study as above.  Cirrhosis and portal hypertension.  No evidence of ascites.     Xr Chest Portable    07/08/2014   EXAMINATION: SINGLE VIEW OF THE CHEST  07/07/2014 11:34 pm  COMPARISON: 06/03/2014  HISTORY: ORDERING SYSTEM PROVIDED HISTORY: other TECHNOLOGIST PROVIDED HISTORY: Ordering Physician Provided Reason for Exam: dizziness Acuity: Unknown Type of Exam: Unknown  FINDINGS: Cardiac leads project over the chest.  Diffuse interstitial opacity is demonstrated bilaterally.  Heterogeneous opacity is seen adjacent to the left heart border.  No evidence of pleural effusion.  Negative for pneumothorax. Cardiac and mediastinal silhouettes are similar to prior.  Old posterior left 7th rib fracture.     07/08/2014   IMPRESSION: Mild pulmonary edema.     Vl Extremity Venous Right    07/10/2014   Lower Extremities DVT Study   Demographics    Patient Name      Andrew [name]    Date of Study     07/09/2014          Gender  Male    Patient Number    [id number]          Date of Birth       [birth year]    Visit Number      [id number]         Age                 9 year(s)    Accession Number  [id number]           Room Number         5560    Corporate ID      [id number]            Sonographer         Elberta Fortis,                                                            RVS    Ordering          Barb Merino,   Interpreting        [facility]  Physician         MD                  Physician           Moses Manners,                                                            MD, [facility], RPVI   Procedure  Type of Study:    Veins:Lower Extremities DVT Study, VL EXTREMITY VENOUS DUPLEX RIGHT.    Generic Orders:VL EXTREMITY VENOUS DUPLEX RIGHT.    Vascular Sonographer Report   Additional  Indications:Swelling  Impressions Right Impression Acute totally occluding deep vein thrombosis involving the right proximal PTV and one gastroc vein zone 5-6. Acute partially occluding deep vein thrombosis involving the popliteal vein and right one gastroc vein zone 5-6. No other evidence of deep vein or superficial vein thrombosis involving the right lower extremity and the left common femoral vein. Calf veins were not well visualized on the right due to patient positioning and edema.  Verbal to [employer].  Conclusions    Summary    Acute totally occluding deep vein thrombosis involving the right proximal  PTV and one gastroc vein zone 5-6.  Acute partially occluding deep vein thrombosis involving the popliteal vein  and right one gastroc vein zone 5-6.  Calf veins were not well visualized on the right due to patient positioning  and edema.    Signature    ------------------------------------------------------------------  Electronically signed by Moses Manners, MD, [facility], RPVI  (Interpreting physician) on 07/10/2014 at 11:34 AM  ------------------------------------------------------------------   Patient Status:Routine. Study Location: [facility] - Vascular Lab. Technical Quality:Poor visualization.  Risk Factors History +------------------+----------+----------------------------------------------+ !Diagnosis         !Date      !Comments                                      ! +------------------+----------+----------------------------------------------+ !  Previous Procedure!06/27/2014!Venous Doppler Bilateral: Acute DVT right     ! !                  !          !gastroc veins.                                ! +------------------+----------+----------------------------------------------+  Velocities are measured in cm/s ; Diameters are measured in mm  Right Lower Extremities DVT Study Measurements Right 2D Measurements +------------------------+----------+---------------+----------+ !Location                 !Visualized!Compressibility!Thrombosis! +------------------------+----------+---------------+----------+ !Sapheno Femoral Junction!Yes       !Yes            !None      ! +------------------------+----------+---------------+----------+ !GSV Thigh               !Yes       !Yes            !None      ! +------------------------+----------+---------------+----------+ !Common Femoral          !Yes       !Yes            !None      ! +------------------------+----------+---------------+----------+ !Prox Femoral            !Yes       !Yes            !None      ! +------------------------+----------+---------------+----------+ !Mid Femoral             !Yes       !Yes            !None      ! +------------------------+----------+---------------+----------+ !Dist Femoral            !Yes       !Yes            !None      ! +------------------------+----------+---------------+----------+ !Deep Femoral            !Yes       !Yes            !None      ! +------------------------+----------+---------------+----------+ !Popliteal               !Yes       !Partial        !Acute     ! +------------------------+----------+---------------+----------+ !GSV Below Knee          !Yes       !Yes            !None      ! +------------------------+----------+---------------+----------+ !Gastroc                 !Yes       !Partial        !Acute     ! +------------------------+----------+---------------+----------+ !PTV                     !Yes       !No             !Acute     ! +------------------------+----------+---------------+----------+ !Peroneal                !Yes       !Yes            !None      ! +------------------------+----------+---------------+----------+ !SSV                     !  Yes       !Yes            !None      ! +------------------------+----------+---------------+----------+  Right Doppler Measurements +--------------+------+------+------------+ !Location      !Signal!Reflux!Reflux (sec)!  +--------------+------+------+------------+ !Common Femoral!Phasic!      !            ! +--------------+------+------+------------+ !Femoral       !Phasic!      !            ! +--------------+------+------+------------+ !Deep Femoral  !Phasic!      !            ! +--------------+------+------+------------+ !Popliteal     !Phasic!      !            ! +--------------+------+------+------------+  Left Lower Extremities DVT Study Measurements Left 2D Measurements +------------------------+----------+---------------+----------+ !Location                !Visualized!Compressibility!Thrombosis! +------------------------+----------+---------------+----------+ !Sapheno Femoral Junction!Yes       !Yes            !None      ! +------------------------+----------+---------------+----------+ !Common Femoral          !Yes       !Yes            !None      ! +------------------------+----------+---------------+----------+  Left Doppler Measurements +--------------+------+------+------------+ !Location      !Signal!Reflux!Reflux (sec)! +--------------+------+------+------------+ !Common Femoral!Phasic!      !            ! +--------------+------+------+------------+    Vl Extremity Venous Bilateral    07/09/2014   Vascular Lower Extremities DVT Study Procedure  -- PRELIMINARY SONOGRAPHER REPORT --    Demographics    Patient Name      Andrew [name]    Date of Study     07/09/2014          Gender              Male    Patient Number    [id number]          Date of Birth       05-22-1940    Visit Number      [id number]         Age                 74 year(s)    Accession Number  [id number]           Room Number         6010    Corporate ID      [id number]            Sonographer         Erasmo Leventhal,                                                            RVS    Ordering          Deniece Portela,   Interpreting        [facility]  Physician         MD                  Physician   Procedure  Type of Study:    Veins:Lower  Extremities DVT  Study, VL EXTREMITY VENOUS DUPLEX RIGHT.   Tech Comments Right Acute totally occluding deep vein thrombosis involving the right proximal PTV and one gastroc vein zone 5-6. Acute partially occluding deep vein thrombosis involving the popliteal vein and right one gastroc vein zone 5-6. No other evidence of deep vein or superficial vein thrombosis involving the right lower extremity and the left common femoral vein. Calf veins were not well visualized on the right due to patient positioning and edema.  verbal to [employer].      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

## 2014-07-30 NOTE — Progress Notes (Signed)
Return from CT - by bed - now in room - resting- wife at bedside- call light in reach

## 2014-07-30 NOTE — Plan of Care (Signed)
Problem: Activity:  Goal: Ability to tolerate increased activity will improve  Ability to tolerate increased activity will improve   Outcome: Ongoing  Alert but slow to respond- lungs clear but diminished bilateral -NSR on tele- no edema - up to chair with OT assist- weak hard to make steps without assistance- with use of walker and encouragement to chair- chair alarm on - call light in reach - wife at chairside

## 2014-07-30 NOTE — Oncology Nurse Navigation (Addendum)
Oncology and Hematology Care   Progress Note    07/30/2014    SUBJECTIVE:patient is lethargic and confused. He has had no improvement in his hemoglobin despite transfusion two units packed cells    OBJECTIVE:    Physical Assessment:  Vitals:  BP 98/61 mmHg   Pulse 78   Temp(Src) 98.4 ??F (36.9 ??C) (Temporal)   Resp 16   Ht 5\' 5"  (1.651 m)   Wt 176 lb 8 oz (80.06 kg)   BMI 29.37 kg/m2   SpO2 98%   24HR INTAKE/OUTPUT:    Intake/Output Summary (Last 24 hours) at 07/30/14 0736  Last data filed at 07/29/14 1749   Gross per 24 hour   Intake 1907.5 ml   Output      0 ml   Net 1907.5 ml       CONSTITUTIONAL: drowsy and confused  HEENT: PERRL, Neck: soft, supple, no cervical, supraclavicular or axillary adenopathy  RESPIRATORY:  No increased work of breathing, breath sounds decreased.  CARDIOVASCULAR:  Cardiac S1/S2, RRR  GASTROINTESTINAL:  abdomen soft +BS x4, no hepatosplenomegaly  SKIN:  negative for rash and skin lesion(s)  MUSCULOSKELETAL:  negative for pain and muscle weakness  EXTREMITIES: Negative for Lower extremity edema    Labs Results:    CBC: Recent Labs      07/28/14   0638  07/29/14   1851  07/30/14   0635   WBC  2.0*   --   2.8*   HGB  7.5*  8.4*  7.0*   HCT  22.2*  25.0*  21.0*   MCV  106.8*   --   100.1*   PLT  59*   --    --      BMP: Recent Labs      07/30/14   0635   NA  136   K  5.0   CL  109   CO2  22   BUN  23*   CREATININE  0.9     LIVER PROFILE: No results for input(s): AST, ALT, LIPASE, BILIDIR, BILITOT, ALKPHOS in the last 72 hours.    Invalid input(s):  AMYLASE,  ALB  PT/INR:    Lab Results   Component Value Date    PROTIME 15.7 07/25/2014    PROTIME 14.7 07/07/2014    PROTIME 16.1 04/02/2014    INR 1.37 07/25/2014    INR 1.28 07/07/2014    INR 1.40 04/02/2014    INR 1.17 05/14/2010    INR 1.18 02/13/2010    INR 1.15 10/17/2009     PTT:    Lab Results   Component Value Date    APTT 46.5 07/25/2014    APTT 29.7 10/17/2009     UA:No results for input(s): NITRITE, COLORU, PHUR, LABCAST, WBCUA,  RBCUA, MUCUS, TRICHOMONAS, YEAST, BACTERIA, CLARITYU, SPECGRAV, LEUKOCYTESUR, UROBILINOGEN, BILIRUBINUR, BLOODU, GLUCOSEU, AMORPHOUS in the last 72 hours.    Invalid input(s): KETONESU  Magnesium:   Lab Results   Component Value Date    MG 1.50 01/14/2014    MG 1.70 01/10/2014    MG 2.0 11/19/2011     ANA:  Lab Results   Component Value Date    ANATITER Negative at <1-80 10/17/2009    ANA POSITIVE at 1-40 10/17/2009       RADIOLOGY:    Xr Chest Standard Two Vw    07/27/2014   EXAMINATION: TWO VIEWS OF THE CHEST  07/27/2014 5:52 pm  COMPARISON: 07/25/2014  HISTORY: ORDERING SYSTEM PROVIDED HISTORY: follow  up pneumonia TECHNOLOGIST PROVIDED HISTORY: Ordering Physician Provided Reason for Exam: follow up pneumonia Acuity: Acute Type of Exam: Initial  FINDINGS: Compression fracture of the lower thoracic spine is again seen.  Minimal right base atelectasis is noted.  There is scarring on the left.  Remote rib fractures are seen.  Cardiac silhouette within normal limits.  There is a hiatal hernia     07/27/2014   IMPRESSION: No acute disease     Xr Chest Standard Two Vw    07/25/2014   EXAMINATION: TWO VIEWS OF THE CHEST  07/25/2014 8:17 am  COMPARISON: Chest x-ray Jul 07, 2014  HISTORY: ORDERING SYSTEM PROVIDED HISTORY: generalized weakness, hypotension TECHNOLOGIST PROVIDED HISTORY: Ordering Physician Provided Reason for Exam: weakness Acuity: Unknown Type of Exam: Unknown  FINDINGS: There is new focal density of the right upper lobe above the hilum.  There also is new faint airspace disease of the lateral right lung base.  There is new focal opacity of the retrocardiac left lower lobe.  There is blunting of the left costophrenic angle consistent with pleural fluid.     07/25/2014   IMPRESSION: New bilateral multifocal airspace disease, most likely atelectasis or pneumonia although follow-up to ensure resolution is recommended.  New small to moderate size left pleural effusion.     Ct Head Wo Contrast    07/07/2014    EXAMINATION: CT OF THE HEAD WITHOUT CONTRAST  07/07/2014 11:11 pm  TECHNIQUE: CT of the head was performed without the administration of intravenous contrast.  COMPARISON: 04/01/2014  HISTORY: ORDERING SYSTEM PROVIDED HISTORY: dizzy TECHNOLOGIST PROVIDED HISTORY: Ordering Physician Provided Reason for Exam: dizzy  FINDINGS: BRAIN/VENTRICLES: There is no acute intracranial hemorrhage, mass effect or midline shift.  No abnormal extra-axial fluid collection.  The gray-white differentiation is maintained without evidence of an acute infarct.  There is no evidence of hydrocephalus.  ORBITS: The visualized portion of the orbits demonstrate no acute abnormality.  SINUSES: There is minimal mucosal thickening in the left maxillary sinus. The remaining visualized paranasal sinuses are clear.  The mastoid air cells are unremarkable.  SOFT TISSUES/SKULL:  No acute abnormality of the visualized skull or soft tissues.     07/07/2014   IMPRESSION: No acute intracranial abnormality.     Ct Chest W Contrast    07/26/2014   EXAMINATION: CT OF THE CHEST WITH CONTRAST 07/26/2014 2:14 pm  TECHNIQUE: CT of the chest was performed with the administration of intravenous contrast. Multiplanar reformatted images are provided for review. Dose modulation, iterative reconstruction, and/or weight based adjustment of the mA/kV was utilized to reduce the radiation dose to as low as reasonably achievable.  COMPARISON: 06/27/2014  HISTORY: ORDERING SYSTEM PROVIDED HISTORY: SHORTNESS OF BREATH  FINDINGS: Mediastinum: Stable mediastinal lymph nodes, no convincing adenopathy. Thoracic aorta normal in caliber.  No pericardial effusion.  .  Lungs/pleura: Interval decrease in right pleural effusion.  Stable trace left pleural effusion.  There are multifocal airspace opacities that are predominantly bandlike in morphology, mostly in the lower lobes, right middle lobe, and lingula.  There are a cluster of tiny nodules in the lateral right upper lobe.  There is a  stable 1.3 cm nodule in the superior segment of the right lower lobe.  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  There is a tiny nonobstructing right renal stone.  Large venous collaterals are noted adjacent to the spleen  Soft Tissues/Bones: There is bilateral gynecomastia.  There are multiple thyroid nodules     07/26/2014  IMPRESSION: 1. Multifocal airspace opacities in the lower lobes, right middle lobe, and lingula have an appearance compatible with atelectasis, with or without pneumonia.  Cluster of nodular densities in the lateral right upper lobe most likely represents atypical infection.  1.3 cm nodule in the superior segment of the right lower lobe warrants six-month follow-up 2. Interval decrease in right pleural effusion 3. 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  LIVER: Evaluation of the liver is limited.  There is diffuse increased echogenicity with no focal lesion noted.  Somewhat nodular contour of the liver is compatible with known cirrhosis.  BILIARY SYSTEM: Limited imaging of the gallbladder is grossly unremarkable.  Common bile duct is within normal limits measuring 6 mm.  KIDNEYS: Right kidney is nonvisualized.  Left kidney measures 10.1 x 4.9 x 5.3 cm and is grossly unremarkable.  PANCREAS: Pancreas is nonvisualized.  SPLEEN: Spleen measures 10.1 cm.  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  IVC: The IVC is patent.  AORTA: Aorta is obscured.  OTHER: No evidence of ascites.     07/08/2014   IMPRESSION: Limited study as above.  Cirrhosis and portal hypertension.  No evidence of ascites.     Xr Chest  Portable    07/08/2014   EXAMINATION: SINGLE VIEW OF THE CHEST  07/07/2014 11:34 pm  COMPARISON: 06/03/2014  HISTORY: ORDERING SYSTEM PROVIDED HISTORY: other TECHNOLOGIST PROVIDED HISTORY: Ordering Physician Provided Reason for Exam: dizziness Acuity: Unknown Type of Exam: Unknown  FINDINGS: Cardiac leads project over the chest.  Diffuse interstitial opacity is demonstrated bilaterally.  Heterogeneous opacity is seen adjacent to the left heart border.  No evidence of pleural effusion.  Negative for pneumothorax. Cardiac and mediastinal silhouettes are similar to prior.  Old posterior left 7th rib fracture.     07/08/2014   IMPRESSION: Mild pulmonary edema.     Vl Extremity Venous Right    07/10/2014   Lower Extremities DVT Study   Demographics    Patient Name      Andrew [name]    Date of Study     07/09/2014          Gender              Male    Patient Number    [id number]          Date of Birth       [birth year]    Visit Number      [id number]         Age                 74 year(s)    Accession Number  [id number]           Room Number         5784    Corporate ID      [id number]            Sonographer         Erasmo Leventhal,  RVS    Ordering          Deniece Portela,   Interpreting        [facility]  Physician         MD                  Physician           Peggyann Shoals,                                                            MD, [facility] [place]   Procedure  Type of Study:    Veins:Lower Extremities DVT Study, VL EXTREMITY VENOUS DUPLEX RIGHT.    Generic Orders:VL EXTREMITY VENOUS DUPLEX RIGHT.    Vascular Sonographer Report   Additional Indications:Swelling  Impressions Right Impression Acute totally occluding deep vein thrombosis involving the right proximal PTV and one gastroc vein zone 5-6. Acute partially occluding deep vein thrombosis involving the popliteal vein and right one gastroc vein zone 5-6. No other evidence of deep vein or superficial vein  thrombosis involving the right lower extremity and the left common femoral vein. Calf veins were not well visualized on the right due to patient positioning and edema.  Verbal to [employer].  Conclusions    Summary    Acute totally occluding deep vein thrombosis involving the right proximal  PTV and one gastroc vein zone 5-6.  Acute partially occluding deep vein thrombosis involving the popliteal vein  and right one gastroc vein zone 5-6.  Calf veins were not well visualized on the right due to patient positioning  and edema.    Signature    ------------------------------------------------------------------  Electronically signed by Peggyann Shoals, MD, [facility], [facility]  (Interpreting physician) on 07/10/2014 at 11:34 AM  ------------------------------------------------------------------   Patient Status:Routine. [facility]. Technical Quality:Poor visualization.  Risk Factors History +------------------+----------+----------------------------------------------+ !Diagnosis         !Date      !Comments                                      ! +------------------+----------+----------------------------------------------+ !Previous Procedure!06/27/2014!Venous Doppler Bilateral: Acute DVT right     ! !                  !          !gastroc veins.                                ! +------------------+----------+----------------------------------------------+  Velocities are measured in cm/s ; Diameters are measured in mm  Right Lower Extremities DVT Study Measurements Right 2D Measurements +------------------------+----------+---------------+----------+ !Location                !Visualized!Compressibility!Thrombosis! +------------------------+----------+---------------+----------+ !Sapheno Femoral Junction!Yes       !Yes            !None      ! +------------------------+----------+---------------+----------+ !GSV Thigh               !Yes       !Yes            !None      !  +------------------------+----------+---------------+----------+ !  Common Femoral          !Yes       !Yes            !None      ! +------------------------+----------+---------------+----------+ !Prox Femoral            !Yes       !Yes            !None      ! +------------------------+----------+---------------+----------+ !Mid Femoral             !Yes       !Yes            !None      ! +------------------------+----------+---------------+----------+ !Dist Femoral            !Yes       !Yes            !None      ! +------------------------+----------+---------------+----------+ !Deep Femoral            !Yes       !Yes            !None      ! +------------------------+----------+---------------+----------+ !Popliteal               !Yes       !Partial        !Acute     ! +------------------------+----------+---------------+----------+ !GSV Below Knee          !Yes       !Yes            !None      ! +------------------------+----------+---------------+----------+ !Gastroc                 !Yes       !Partial        !Acute     ! +------------------------+----------+---------------+----------+ !PTV                     !Yes       !No             !Acute     ! +------------------------+----------+---------------+----------+ !Peroneal                !Yes       !Yes            !None      ! +------------------------+----------+---------------+----------+ !SSV                     !Yes       !Yes            !None      ! +------------------------+----------+---------------+----------+  Right Doppler Measurements +--------------+------+------+------------+ !Location      !Signal!Reflux!Reflux (sec)! +--------------+------+------+------------+ !Common Femoral!Phasic!      !            ! +--------------+------+------+------------+ !Femoral       !Phasic!      !            ! +--------------+------+------+------------+ !Deep Femoral  !Phasic!      !            ! +--------------+------+------+------------+ !Popliteal     !Phasic!      !             ! +--------------+------+------+------------+  Left Lower Extremities DVT Study Measurements Left 2D Measurements +------------------------+----------+---------------+----------+ !Location                !Visualized!Compressibility!Thrombosis! +------------------------+----------+---------------+----------+ !Sapheno Femoral Junction!Yes       !  Yes            !None      ! +------------------------+----------+---------------+----------+ !Common Femoral          !Yes       !Yes            !None      ! +------------------------+----------+---------------+----------+  Left Doppler Measurements +--------------+------+------+------------+ !Location      !Signal!Reflux!Reflux (sec)! +--------------+------+------+------------+ !Common Femoral!Phasic!      !            ! +--------------+------+------+------------+    Vl Extremity Venous Bilateral    07/09/2014   Vascular Lower Extremities DVT Study Procedure  -- PRELIMINARY SONOGRAPHER REPORT --    Demographics    Patient Name      Andrew [name]    Date of Study     07/09/2014          Gender              Male    Patient Number    [id number]          Date of Birth       June 09, 1940    Visit Number      [id number]         Age                 10 year(s)    Accession Number  [id number]           Room Number         1660    Corporate ID      [id number]            Sonographer         Erasmo Leventhal,                                                            RVS    Ordering          Deniece Portela,   Interpreting        [facility]  Physician         MD                  Physician   Procedure  Type of Study:    Veins:Lower Extremities DVT Study, VL EXTREMITY VENOUS DUPLEX RIGHT.   Tech Comments Right Acute totally occluding deep vein thrombosis involving the right proximal PTV and one gastroc vein zone 5-6. Acute partially occluding deep vein thrombosis involving the popliteal vein and right one gastroc vein zone 5-6. No other evidence of deep vein or superficial vein  thrombosis involving the right lower extremity and the left common femoral vein. Calf veins were not well visualized on the right due to patient positioning and edema.  verbal to [employer].      Current Medications  ??? lactulose  20 g Oral TID   ??? amoxicillin-clavulanate  1 tablet Oral 2 times per day   ??? carvedilol  6.25 mg Oral BID WC   ??? vitamin D  1,000 Units Oral Daily   ??? citalopram  10 mg Oral Daily   ??? enoxaparin  1 mg/kg Subcutaneous BID   ??? eplerenone  50 mg Oral Daily   ??? furosemide  40 mg Oral Daily   ??? lisinopril  5 mg Oral Nightly   ??? magnesium oxide  400 mg Oral Daily   ??? rifaximin  550 mg Oral BID   ??? 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 Al Corpus will see patient tomorrow  Cirrhosis-alpha 1 antitrypsin deficiency Sees Dr. Peggye Fothergill check ammonia level  Right leg dvt on full dose therapeutic lovenox-if no improvement in count with transfusion could consider ct to evaluate for possible retroperitoneal bleed NO obvious signs on exam          I have discussed the above stated plan with the patient and they verbalized understanding and agreed with the plan. Thank you for allowing [place] to participate in this patients care.    Darlyn Read, MD, 07/30/2014, 7:36 AM

## 2014-07-30 NOTE — Progress Notes (Signed)
1st unit PRBC started after verification with RN Vicente Males- vitals stable- running at 52ml/hr x 1st 15 minutes as per protocol RN at bedside -much family at bedside

## 2014-07-30 NOTE — Care Coordination-Inpatient (Signed)
Met with patient's wife, and she understands patient is not accepted by the [facility].  Her back-up plan is still [facility], and palliative care is following.

## 2014-07-30 NOTE — Progress Notes (Signed)
Repeat HGB 6.9 result called to Dr Tasia Catchings at his office

## 2014-07-30 NOTE — Progress Notes (Signed)
After 15 minutes PRBC infusing - vitals stable- no significant assessment changes- rate now increased to 100 ml;hr- family at bedside- call light in reach

## 2014-07-30 NOTE — Progress Notes (Signed)
Physical Therapy    Chart reviewed. Pt has received 4 units of blood during this admission, but Hgb is 7.0. MD looking at add'l labs and assessing for possible bleed. Will hold PT today and reassess pt tomorrow.    Kendell Bane, PT, DPT [phone]

## 2014-07-30 NOTE — Consults (Signed)
[facility]        Consult    Pt Name: Andrew [name]  MRN: [id number]  Birthdate: 12-Nov-1940  Admission date: 07/25/2014 11:21 AM   Date of evaluation: 07/30/2014  Reason for consult: gluteal hematoma  Patient evaluated at the request of Dr. Tasia Catchings          SUBJECTIVE:    History of Present Illness:    Aquarius Latouche is a 74 y.o. male who presents with generalized weakness who we were asked to see for left gluteal hematoma.  Pt sleepy, arousalable, slow to answer questions.  Most information from family at bedside and from EMR.  Pt does state he has some bilateral hip pain, unable to provide duration or characteristics of pain.  Wife stated he was recently in the hospital, has been home for a couple days and she noticed weakness starting Saturday and that he overall wasn't looking well, he denies pain or nausea.  She reports he may have hit his left side about 3-4 weeks ago.  Otherwise no recent trauma.  Pt has been anemic since admission,he received 2U blood, hgb unchanged.  CT scan showed large left gluteal hematoma.    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    Past Surgical History   has past surgical history that includes AV fistula repair (1970's); Colonoscopy (4/12); Cardiac catheterization; eye surgery (04/12/2014); Cosmetic surgery (04/2013); bone marrow biopsy; and knee surgery.    Medications  Prior to Admission medications    Medication Sig Start Date End Date Taking? Authorizing Provider    enoxaparin (LOVENOX) 80 MG/0.8ML injection Inject 0.8 mLs into the skin 2 times daily for 14 days 07/19/14 08/02/14 Yes Eulis Canner, MD   citalopram (CELEXA) 10 MG tablet Take 1 tablet by mouth daily 07/03/14  Yes Scott A Kotzin, DO   furosemide (LASIX) 40 MG tablet 80 mg in AM, 40 mg PM 06/25/14  Yes Princess Bruins, NP   carvedilol (COREG) 6.25 MG tablet 3.125 mg in AM and 6.25 mg in PM 06/24/14  Yes Princess Bruins, NP   eplerenone (INSPRA) 50 MG tablet Take 1 tablet by mouth daily 06/24/14  Yes Princess Bruins, NP   lisinopril (PRINIVIL;ZESTRIL) 5 MG tablet Take 1 tablet by mouth nightly 05/21/14  Yes Donata Duff, NP   magnesium oxide (MAG-OX) 400 (241.3 MG) MG TABS tablet TAKE ONE TABLET BY MOUTH TWICE DAILY 04/26/14  Yes Scott A Kotzin, DO   traMADol (ULTRAM) 50 MG tablet Take 1 tablet by mouth every 12 hours as needed for Pain 03/07/14  Yes Scott A Kotzin, DO   lactulose 20 GM/30ML SOLN Take 30 mLs by mouth 2 times daily  Patient taking differently: Take 20 g by mouth 4 times daily  08/13/13  Yes Scott A Kotzin, DO   Cholecalciferol (VITAMIN D3) 1000 UNITS CAPS Take by mouth daily    Yes Historical Provider, MD   MILK THISTLE PO Take  by mouth.   Yes Historical Provider, MD   rifaximin (XIFAXAN) 550 MG tablet Take 550 mg by mouth 2 times daily.     Yes Historical Provider, MD  Multiple Vitamins-Minerals (CENTRUM SILVER PO) Take  by mouth daily. 08/23/08  Yes Historical Provider, MD    Scheduled Meds:  ??? lactulose  20 g Oral TID   ??? amoxicillin-clavulanate  1 tablet Oral 2 times per day   ??? carvedilol  6.25 mg Oral BID WC   ??? vitamin D  1,000 Units Oral Daily   ??? citalopram  10 mg Oral Daily   ??? enoxaparin  1 mg/kg Subcutaneous BID   ??? eplerenone  50 mg Oral Daily   ??? furosemide  40 mg Oral Daily   ??? lisinopril  5 mg Oral Nightly   ??? magnesium oxide  400 mg Oral Daily   ??? rifaximin  550 mg Oral BID   ??? sodium chloride flush  10 mL Intravenous 2 times per day     Continuous Infusions:  ??? sodium  chloride 75 mL/hr at 07/30/14 0716     PRN Meds:.ioversol, sodium chloride (PF), traMADol, sodium chloride flush, acetaminophen, HYDROcodone 5 mg - acetaminophen **OR** HYDROcodone 5 mg - acetaminophen, morphine **OR** morphine, magnesium hydroxide, ondansetron, potassium chloride SA **OR** potassium chloride **OR** potassium chloride    Allergies  is allergic to spironolactone.    Family History  family history includes Diabetes in his brother, mother, and sister; Hypertension in his mother.    Social History   reports that he has never smoked. He has never used smokeless tobacco. He reports that he does not drink alcohol or use illicit drugs.      REVIEW OF SYSTEMS:  CONSTITUTIONAL:  Negative except for weakness  HEENT:  negative  RESPIRATORY:  negative  CARDIOVASCULAR:  negative  GASTROINTESTINAL:  negative   GENITOURINARY:  negative  HEMATOLOGIC/LYMPHATIC:  negative  NEUROLOGICAL:  negative    PHYSICAL EXAM:  VITALS:  BP 95/57 mmHg   Pulse 81   Temp(Src) 98.4 ??F (36.9 ??C) (Temporal)   Resp 16   Ht _0  (1.651 m)   Wt 186 lb 14.4 oz (84.777 kg)   BMI 31.10 kg/m2   SpO2 97%  24HR INTAKE/OUTPUT:    Intake/Output Summary (Last 24 hours) at 07/30/14 1451  Last data filed at 07/30/14 9147   Gross per 24 hour   Intake    730 ml   Output      0 ml   Net    730 ml     DRAIN/TUBE OUTPUT:     CONSTITUTIONAL:  Sleepy, arousable, no apparent distress and normal weight  EYES:  sclera clear  ENT:  normocepalic, without obvious abnormality  NECK:  supple, symmetrical, trachea midline  LUNGS:  clear to auscultation  CARDIOVASCULAR:  regular rate and rhythm  ABDOMEN:   normal bowel sounds, soft, non-distended, non-tender,no masses palpated and hernia absent  MUSCULOSKELETAL:  trace+ pitting edema lower extremities  NEUROLOGIC:  Mental Status Exam:  Level of Alertness:   awake  Orientation:   person, place, time  SKIN:  Positive bruising at area of underlying bleeding in the left upper posterior thigh / lower buttock  area    DATA:    CBC:   Recent Labs      07/28/14   0638  07/29/14   1851  07/30/14   0635  07/30/14   1325   WBC  2.0*   --   2.8*   --    HGB  7.5*  8.4*  7.0*  6.9*   HCT  22.2*  25.0*  21.0*  20.7*   PLT  59*   --  45*   --      BMP:  Recent Labs      07/30/14   0635   NA  136   K  5.0   CL  109   CO2  22   BUN  23*   CREATININE  0.9   GLUCOSE  121*     Hepatic: No results for input(s): AST, ALT, ALB, BILITOT, ALKPHOS in the last 72 hours.  Mag:    No results for input(s): MG in the last 72 hours.   Phos:   No results for input(s): PHOS in the last 72 hours.   INR: No results for input(s): INR in the last 72 hours.    IMAGING:  I have personally reviewed the following films:  CT scan abd/pelvis:   EXAMINATION:   CT OF THE ABDOMEN AND PELVIS WITH CONTRAST 07/30/2014 11:54 am      TECHNIQUE:   CT of the abdomen and pelvis was performed with the administration of   intravenous contrast. Multiplanar reformatted images are provided for review.   Dose modulation, iterative reconstruction, and/or weight based adjustment of   the mA/kV was utilized to reduce the radiation dose to as low as reasonably   achievable.      COMPARISON:   09/07/2010      HISTORY:   ORDERING SYSTEM PROVIDED HISTORY: ABD PAIN, FEVER, NO RECENT SURGERY   TECHNOLOGIST PROVIDED HISTORY:   Additional Contrast?->Oral   Ordering Physician Provided Reason for Exam: ABD PAIN, FEVER, NO RECENT   SURGERY   Acuity: Unknown   Type of Exam: Unknown      Unexplained anemia. Evaluate for retroperitoneal hemorrhage.      FINDINGS:   Lower Chest: There is mild bibasilar pleural and parenchymal disease, new   from the prior study.      Organs: The liver is shrunken with a nodular contour consistent with   cirrhosis. No focal liver lesion. The main portal vein is small in size and   grossly patent. There is a recannulized umbilical vein. There are left   upper quadrant varices and a splenorenal shunt.      GI/Bowel: Bowel loops are unremarkable. The gallbladder  is moderately   distended. There are no calcified gallstones.      Pelvis: Urinary bladder, prostate gland and seminal vesicles are unremarkable.      Peritoneum/Retroperitoneum: No adenopathy, free air or free fluid.      Bones/Soft Tissues: There is bilateral flank edema. There is a large left   gluteus muscle mass/hematoma extending into the left upper thigh.         Impression   IMPRESSION:   Large left gluteus muscle mass/hematoma likely accounting for the unexplained   anemia.      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          ASSESSMENT:  1.   Patient Active Problem List   Diagnosis   ??? Erectile dysfunction   ??? Thrombocytopenia   ??? Edema   ??? BPH (benign prostatic hyperplasia)   ??? Alpha-1-antitrypsin deficiency   ??? Cirrhosis, nonalcoholic ([place])   ??? Mild CAD   ??? Chronic systolic CHF (congestive heart failure) ([place])   ??? Non-ischemic cardiomyopathy ([place])   ??? Lumbar compression fracture ([place])   ??? Chronic low back pain   ??? Osteopenia   ??? Coagulopathy ([place])   ??? Abnormal EKG   ??? Visual changes   ??? Fixed pupil of right eye   ???  Anemia   ??? Hemispheric carotid artery syndrome   ??? CAD in native artery   ??? Hyperlipidemia   ??? 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 Bronchiectasis without complication ([place])   ??? Benign essential HTN   ??? Hepatic encephalopathy ([facility])   ??? Ataxia   ??? Pancytopenia ([place])   ??? DVT (deep venous thrombosis) ([place])   ??? 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 Pneumonia   2. left gluteus muscle mass/hematoma - denies pain, anemic, ct scan showing large left gluteus muscle mass/hematoma    PLAN:  1. Follow hgb  2. Transfuse 2 units PRBC  3. On full anticoagulation for DVT, recommend stopping and consider filter placement - follow with Dr. Al Corpus for hx myelodysplasia, anemia, Pancytopenia  4. Abx: as needed per medicine  5. no plan for surgical intervention at this time  Will follow clinical course    6. PPI  7. DVT prophylaxis with SCD's  8. The patient is not currently  smoking. The risks related to smoking were reviewed with the patient. Recommend maintaining a smoke-free lifestyle. Products available for smoking cessation were discussed in detail.    Thank you for the interesting evaluation. Will review and discuss with Dr. Doroteo Glassman.  Further recommendations to follow.      Electronically signed by Delorse Limber, APRN on 07/30/2014 at 2:51 PM    Surgery Staff:  Pt seen and examined, CT and chart reviewed  Met with family at bedside  See full note above  Pt has had intramuscular left buttock bleed / hematoma related to anticoagulation and possible trauma at that area  Will recommend stopping Lovenox, place IVC filter to protect from PE due to DVT  Transfuse and follow H/H, Correct Coags  No drainage of hematoma at this time as not infection, no vital organ compression / compromise, no overlying tissue ischemia, and should tamponade in that space and stop bleeding after pt no longer coagulopathic     Thank you,    Resa Miner Doroteo Glassman

## 2014-07-31 ENCOUNTER — Encounter: Admit: 2014-07-31 | Attending: Vascular Surgery | Primary: Internal Medicine

## 2014-07-31 LAB — BASIC METABOLIC PANEL
Anion Gap: 6 (ref 3–16)
BUN: 28 mg/dL — ABNORMAL HIGH (ref 7–20)
CO2: 20 mmol/L — ABNORMAL LOW (ref 21–32)
Calcium: 7.7 mg/dL — ABNORMAL LOW (ref 8.3–10.6)
Chloride: 106 mmol/L (ref 99–110)
Creatinine: 0.8 mg/dL (ref 0.8–1.3)
GFR African American: >60 (ref >60)
GFR Non-African American: >60 (ref >60)
Glucose: 114 mg/dL — ABNORMAL HIGH (ref 70–99)
Potassium: 4.8 mmol/L (ref 3.5–5.1)
Sodium: 132 mmol/L — ABNORMAL LOW (ref 136–145)

## 2014-07-31 LAB — CBC WITH AUTO DIFFERENTIAL
Basophils %: 0.1 %
Basophils %: 0.9 %
Basophils Absolute: 0 10*3/uL (ref 0.0–0.2)
Basophils Absolute: 0 10*3/uL (ref 0.0–0.2)
Eosinophils %: 0.9 %
Eosinophils %: 0.6 %
Eosinophils Absolute: 0 10*3/uL (ref 0.0–0.6)
Eosinophils Absolute: 0 10*3/uL (ref 0.0–0.6)
Hematocrit: 26.1 % — ABNORMAL LOW (ref 40.5–52.5)
Hematocrit: 22.9 % — ABNORMAL LOW (ref 40.5–52.5)
Hemoglobin: 8.4 g/dL — ABNORMAL LOW (ref 13.5–17.5)
Hemoglobin: 7.5 g/dL — ABNORMAL LOW (ref 13.5–17.5)
Lymphocytes %: 44.5 %
Lymphocytes %: 46.1 %
Lymphocytes Absolute: 1.5 10*3/uL (ref 1.0–5.1)
Lymphocytes Absolute: 1.3 10*3/uL (ref 1.0–5.1)
MCH: 33.6 pg (ref 26.0–34.0)
MCH: 33.3 pg (ref 26.0–34.0)
MCHC: 32.2 g/dL (ref 31.0–36.0)
MCHC: 32.7 g/dL (ref 31.0–36.0)
MCV: 104.3 fL — ABNORMAL HIGH (ref 80.0–100.0)
MCV: 101.6 fL — ABNORMAL HIGH (ref 80.0–100.0)
MPV: 10.3 fL (ref 5.0–10.5)
MPV: 9.2 fL (ref 5.0–10.5)
Monocytes %: 12.5 %
Monocytes %: 15.4 %
Monocytes Absolute: 0.4 10*3/uL (ref 0.0–1.3)
Monocytes Absolute: 0.4 10*3/uL (ref 0.0–1.3)
Neutrophils %: 42 %
Neutrophils %: 37 %
Neutrophils Absolute: 1.4 10*3/uL — ABNORMAL LOW (ref 1.7–7.7)
Neutrophils Absolute: 1.1 10*3/uL — ABNORMAL LOW (ref 1.7–7.7)
PLATELET SLIDE REVIEW: DECREASED
Platelets: 48 10*3/uL — ABNORMAL LOW (ref 135–450)
Platelets: 43 10*3/uL — ABNORMAL LOW (ref 135–450)
RBC: 2.5 M/uL — ABNORMAL LOW (ref 4.20–5.90)
RBC: 2.26 M/uL — ABNORMAL LOW (ref 4.20–5.90)
RDW: 19.3 % — ABNORMAL HIGH (ref 12.4–15.4)
RDW: 18.5 % — ABNORMAL HIGH (ref 12.4–15.4)
WBC: 3.3 10*3/uL — ABNORMAL LOW (ref 4.0–11.0)
WBC: 2.9 10*3/uL — ABNORMAL LOW (ref 4.0–11.0)

## 2014-07-31 LAB — HEMOGLOBIN AND HEMATOCRIT
Hematocrit: 24.4 % — ABNORMAL LOW (ref 40.5–52.5)
Hematocrit: 22.8 % — ABNORMAL LOW (ref 40.5–52.5)
Hemoglobin: 8 g/dL — ABNORMAL LOW (ref 13.5–17.5)
Hemoglobin: 7.4 g/dL — ABNORMAL LOW (ref 13.5–17.5)

## 2014-07-31 LAB — PREPARE RBC (CROSSMATCH)

## 2014-07-31 LAB — HEPATIC FUNCTION PANEL
ALT: 15 U/L (ref 10–40)
AST: 32 U/L (ref 15–37)
Albumin: 1.6 g/dL — ABNORMAL LOW (ref 3.4–5.0)
Alkaline Phosphatase: 64 U/L (ref 40–129)
Bilirubin, Direct: 0.5 mg/dL — ABNORMAL HIGH (ref 0.0–0.3)
Bilirubin, Indirect: 1.1 mg/dL — ABNORMAL HIGH (ref 0.0–1.0)
Total Bilirubin: 1.6 mg/dL — ABNORMAL HIGH (ref 0.0–1.0)
Total Protein: 4.6 g/dL — ABNORMAL LOW (ref 6.4–8.2)

## 2014-07-31 LAB — BLOOD OCCULT STOOL SCREEN #3: Occult Blood, Stool #3: NORMAL

## 2014-07-31 LAB — BLOOD OCCULT STOOL SCREEN #2: Occult Blood, Stool #2: NEGATIVE

## 2014-07-31 LAB — AMMONIA: Ammonia: 52 umol/L (ref 16–60)

## 2014-07-31 MED ORDER — MAGNESIUM HYDROXIDE 400 MG/5ML PO SUSP
400 MG/5ML | Freq: Every day | ORAL | Status: DC | PRN
Start: 2014-07-31 — End: 2014-08-06

## 2014-07-31 MED ORDER — HEPARIN (PORCINE) IN NACL 2-0.9 UNIT/ML-% IJ SOLN
INTRAMUSCULAR | Status: AC
Start: 2014-07-31 — End: ?

## 2014-07-31 MED ORDER — NORMAL SALINE FLUSH 0.9 % IV SOLN
0.9 % | INTRAVENOUS | Status: DC | PRN
Start: 2014-07-31 — End: 2014-08-06

## 2014-07-31 MED ORDER — LIDOCAINE HCL (PF) 1 % IJ SOLN
1 % | INTRAMUSCULAR | Status: AC
Start: 2014-07-31 — End: ?

## 2014-07-31 MED ORDER — MIDAZOLAM HCL 2 MG/2ML IJ SOLN
2 MG/ML | INTRAMUSCULAR | Status: AC
Start: 2014-07-31 — End: ?

## 2014-07-31 MED ORDER — FENTANYL CITRATE (PF) 100 MCG/2ML IJ SOLN
100 MCG/2ML | INTRAMUSCULAR | Status: AC
Start: 2014-07-31 — End: ?

## 2014-07-31 MED ORDER — ONDANSETRON HCL 4 MG/2ML IJ SOLN
4 MG/2ML | Freq: Four times a day (QID) | INTRAMUSCULAR | Status: DC | PRN
Start: 2014-07-31 — End: 2014-08-06

## 2014-07-31 MED ORDER — SODIUM CHLORIDE 0.9 % IV BOLUS
0.9 % | Freq: Once | INTRAVENOUS | Status: DC
Start: 2014-07-31 — End: 2014-08-06

## 2014-07-31 MED ORDER — ACETAMINOPHEN 325 MG PO TABS
325 MG | ORAL | Status: DC | PRN
Start: 2014-07-31 — End: 2014-08-06

## 2014-07-31 MED ORDER — NORMAL SALINE FLUSH 0.9 % IV SOLN
0.9 % | Freq: Two times a day (BID) | INTRAVENOUS | Status: DC
Start: 2014-07-31 — End: 2014-08-06
  Administered 2014-07-31 – 2014-08-06 (×11): 10 mL via INTRAVENOUS

## 2014-07-31 MED FILL — SODIUM CHLORIDE 0.9 % IV SOLN: 0.9 % | INTRAVENOUS | Qty: 1000

## 2014-07-31 MED FILL — HEPARIN (PORCINE) IN NACL 2-0.9 UNIT/ML-% IJ SOLN: INTRAMUSCULAR | Qty: 500

## 2014-07-31 MED FILL — AMOXICILLIN-POT CLAVULANATE 875-125 MG PO TABS: 875-125 MG | ORAL | Qty: 1

## 2014-07-31 MED FILL — MIDAZOLAM HCL 2 MG/2ML IJ SOLN: 2 MG/ML | INTRAMUSCULAR | Qty: 2

## 2014-07-31 MED FILL — NORMAL SALINE FLUSH 0.9 % IV SOLN: 0.9 % | INTRAVENOUS | Qty: 10

## 2014-07-31 MED FILL — XIFAXAN 550 MG PO TABS: 550 MG | ORAL | Qty: 1

## 2014-07-31 MED FILL — CARVEDILOL 6.25 MG PO TABS: 6.25 MG | ORAL | Qty: 1

## 2014-07-31 MED FILL — MAGNESIUM OXIDE 400 (241.3 MG) MG PO TABS: 400 (241.3 Mg) MG | ORAL | Qty: 1

## 2014-07-31 MED FILL — LIDOCAINE HCL (PF) 1 % IJ SOLN: 1 % | INTRAMUSCULAR | Qty: 30

## 2014-07-31 MED FILL — FENTANYL CITRATE (PF) 100 MCG/2ML IJ SOLN: 100 MCG/2ML | INTRAMUSCULAR | Qty: 2

## 2014-07-31 MED FILL — CITALOPRAM HYDROBROMIDE 20 MG PO TABS: 20 MG | ORAL | Qty: 1

## 2014-07-31 MED FILL — VITAMIN D3 25 MCG (1000 UT) PO TABS: 25 MCG (1000 UT) | ORAL | Qty: 1

## 2014-07-31 MED FILL — EPLERENONE 25 MG PO TABS: 25 MG | ORAL | Qty: 2

## 2014-07-31 MED FILL — FUROSEMIDE 40 MG PO TABS: 40 MG | ORAL | Qty: 1

## 2014-07-31 NOTE — Plan of Care (Signed)
Problem: SAFETY  Goal: Free from accidental physical injury  Patient free from harm. ID bands on, bed in lowest position, call light in reach. Patient instructed to call for help if needed. Patient educated on ambulation and safety.        Problem: Risk for Impaired Skin Integrity  Goal: Tissue integrity - skin and mucous membranes  Structural intactness and normal physiological function of skin and  mucous membranes.   Skin assessed. Pt skin cleaned and dried as needed. Pt repositioned in the bed with pillows for support.  Darla Lesches RN

## 2014-07-31 NOTE — Progress Notes (Addendum)
Physical Therapy/Occupational Therapy    Attempted to see pt this AM for PT tx session. Pt off floor for IVC filter placement per RN. Will hold this date and follow up per PT POC as medically appropriate.     Thank you,   Genene Churn PT, DPT [phone]      On hold per RN this date, see above. Will follow up with OT as appropriate.  Thanks,   Georgia Lopes, [place], OTR/L, [place]

## 2014-07-31 NOTE — Progress Notes (Signed)
Palliative Care:     Patient has been lethargic. Not able to have meaningful conversation with patient. Acute rehab declined patient. Left leg ecchymotic from gluteal hematoma. IVC filter placed today. Patient is weak. Wife has been staying with patient 24/7.  Patient lacks safety awareness, wife states she is not able to leave him even with bed alarm as she is fearful he will become impulsive and fall. She would like [facility].

## 2014-07-31 NOTE — Progress Notes (Signed)
[facility]        PATIENT NAME: Andrew [name]     TODAY'S DATE: 07/31/2014    SUbJECTIVE:    Pt resting in bed.  More awake,IVC filter placed this AM, site looks good. Denies pain or nausea     OBJECTIVE:  VITALS:  BP 111/71 mmHg   Pulse 89   Temp(Src) 97.9 ??F (36.6 ??C) (Temporal)   Resp 16   Ht 5\' 5"  (1.651 m)   Wt 189 lb 11.2 oz (86.047 kg)   BMI 31.57 kg/m2   SpO2 94%    INTAKE/OUTPUT:    I/O last 3 completed shifts:  In: 7 [P.O.:660]  Out: -        CONSTITUTIONAL:  awake and alert  LUNGS:  Non increased WOB  ABDOMEN:  soft, non-distended, non-tender   EXT: right groin site from IVC placement looks good.  Left leg pain free, UE edema, soft    Data:  CBC:   Recent Labs      07/30/14   0635   07/31/14   0002  07/31/14   0617  07/31/14   1154   WBC  2.8*   --   3.3*  2.9*   --    HGB  7.0*   < >  8.4*  7.5*  8.0*   HCT  21.0*   < >  26.1*  22.9*  24.4*   PLT  45*   --   48*  43*   --     < > = values in this interval not displayed.     BMP:    Recent Labs      07/30/14   0635  07/31/14   0617   NA  136  132*   K  5.0  4.8   CL  109  106   CO2  22  20*   BUN  23*  28*   CREATININE  0.9  0.8   GLUCOSE  121*  114*     Hepatic:   Recent Labs      07/31/14   0617   AST  32   ALT  15   BILITOT  1.6*   ALKPHOS  64     Mag:    No results for input(s): MG in the last 72 hours.   Phos:   No results for input(s): PHOS in the last 72 hours.   INR: No results for input(s): INR in the last 72 hours.    ASSESSMENT/PLAN:      74 y.o. male admitted with   1. Pneumonia of both lungs due to infectious organism, unspecified part of lung    2. Generalized weakness    3. Hypotension, unspecified hypotension type         More awake today, IVC filter placed this morning,  hgb stable, VSS,     Continue to follow h/h, Appears to have stabilized, no evidence of infection or compartment syndrome  Continue medical management  Will follow      Discussed with patient and nursing.  Discussed with Dr.  Doroteo Glassman      ?? DVT prophylaxis: SCD's  ?? The patient is not currently smoking. The risks related to smoking were reviewed with the patient. Recommend maintaining a smoke-free lifestyle. Products available for smoking cessation were discussed in detail.    Delorse Limber, CNP    Surgery Staff:  Pt seen and examined with NP  See full note above  He is doing better today, Hb stable, swelling and ecchymosis of the left upper and posterior thigh is extensive but stable    Resa Miner Doroteo Glassman

## 2014-07-31 NOTE — Op Note (Signed)
[facility]    Vascular Surgery Procedure Note     PreOp Diagnosis: 1.  RLE DVT  2.  Left gluteal hematoma    PostOp Diagnosis: same    Procedure: Inferior Vena Cavogram, Placement of Inferior Vena Cava Filter    Implanted Devices: Cook Celect vena cava filter    Surgeon: Domenick Bookbinder II    Findings: patent IVC    Complications: None    Sheath:        Right- 7 Fr       Contrast: 10 ml          Angiography and Intervention Technique    Placement/Prep  The patient  was brought to the angiography suite and placed on the table in the supine position. After placement of blood pressure, ECG, and pulse oximetry monitorring, conscious sedation was provided in the form of intravenous fentanyl and versed. The right groin  Was prepped and draped in the usual sterile fashion.     Venous Access   Lidocaine 1% was locally infiltrated over the right femoral vein.  Under duplex ultrasound guidance, an 18 g needle was placed into the femoral vein and a J-tipped guidewire was delivered into the central venous system. The 7 fr french sheath was then placed via Seldinger technique under flouroscopic guidance into the inferior vena cava.    Venacavography  Injection through the sheath provided venacavogram and allowed delineation of the renal veins and appropriate landmarks     Deployment of Vena Cava Filter  The Cook Celect filter was delivered into the sheath through the [employer].  The tip of the filter was positioned at the level of the previously identified renal vein landmarks.  The sheath was then retracted, deploying the filter without difficulty.  Completion vena cavogram demonstrated the tip angled slightly toward the left renal vein.  There was good apposition of the filter to the vein.      Wire/Catheter/Sheath Removal  The sheath was removed and pressure was held per standard protocol and the patient was returned to the holding bay in satisfactory condition.    Findings:      Inferior Vena Cava:  The vena cava was widely patent with single widely patent renal veins on both the right and left sides    Iliac System:  Proximal right and left common iliac veins were widely patent.    Intervention: Satisfactory deployment of Cook Celect inferior vena cava filter distal to renal veins    Impression:  1.  Patent common iliac veins and inferior vena cava  2.  Successful deployment of inferior vena cava filter.        Abbiegail Landgren B. Lucendia Herrlich M.D., FACS.  07/31/2014  9:46 AM

## 2014-07-31 NOTE — Consults (Signed)
[facility]  Consultation Note    Chief Complaint / Reason for Consultation  DVT    History of Present Illness  Patient is a 74 y.o. male who presented to the ED with generalized weakness.  Admitted for pneumonia.  His history is significant for newly diagnosed myelodysplastic syndrome as well as established cirrhosis, CHF, CAD, and hypertension.  He is also noted to have left gluteal hematoma for which general surgery is following.  Additionally his history is significant for acute DVT for which he has been anticoagulated.  Vascular surgery is consulted to assess for IVC filter placement.  Unable to obtain much history from pt though wife is present.    Review of Systems  Unable to obtain    Past Medical History   Diagnosis Date   ??? Hypertension    ??? Kidney stones    ??? Alpha 1 antitrypsin deficiency    ??? Thrombocytopenia ([place]) 04/30/2009   ??? BPH (benign prostatic hyperplasia)    ??? 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 Chronic systolic CHF (congestive heart failure) ([place]) 10/18/2013   ??? CAD (coronary artery disease)      Non-obstructive   ??? Compression fracture of lumbar vertebra ([facility])      multiple, lumbar   ??? Leukopenia    ??? Anemia    ??? Pneumonia    ??? DVT (deep venous thrombosis) ([place])    ??? Cancer ([place])    ??? Hyperlipidemia    ??? Arthritis    ??? Glaucoma    ??? Liver disease    ??? 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 Av fistula repair  1970's   ??? Colonoscopy  4/12     5y   ??? Cardiac catheterization     ??? Eye surgery  04/12/2014     Laser Eye Surgery for glaucoma   ??? Cosmetic surgery  04/2013     eye lids   ??? Bone marrow biopsy     ??? Knee surgery         Allergies   Allergen Reactions   ??? Spironolactone Other (See Comments)     gynecomastia       History     Social History   ??? Marital Status: Married     Spouse Name: N/A     Number of Children: N/A   ??? Years of Education: N/A     Occupational  History   ??? Not on file.     Social History Main Topics   ??? Smoking status: Never Smoker    ??? Smokeless tobacco: Never Used      Comment: Passive smoke exposure: yes   ??? Alcohol Use: No   ??? Drug Use: No   ??? Sexual Activity: Not on file     Other Topics Concern   ??? Not on file     Social History Narrative       Family History   Problem Relation Age of Onset   ??? Diabetes Mother    ??? Hypertension Mother    ??? Diabetes Sister    ??? Diabetes Brother      - No history of bleeding or clotting disorders    Vital Signs  Filed Vitals:    07/30/14 2130 07/30/14 2215 07/30/14 2345 07/31/14 0614   BP: 97/62 106/73 107/74 99/64   Pulse:  76 76 75 92   Temp: 98.6 ??F (37 ??C) 98.4 ??F (36.9 ??C) 98.8 ??F (37.1 ??C)    TempSrc: Temporal Temporal Temporal    Resp: $Remo'18 18 18    'wazNy$ Height:       Weight:       SpO2: 96%  97%        Physical Examination  General:  no apparent distress  Psychiatric: affect appropriate  Head/Eyes/Ears/Nose/Throat:  Atraumatic, vision and hearing intact, face symmetric  Neck:  supple  Chest/Lungs: clear to auscultation bilaterally  Cardiac:  Regular rate and rhythm  Abdomen: soft, nontender  Extremities: warm and well perfused  - bilateral upper extremity motorsensory intact  - bilateral lower extremity motorsensory intact  Vascular exam:  - R femoral: 2  - L femoral: 2  - R DP: 1  - L DP: 1  - R PT: 1  - L PT: 1      Labs  Lab Results   Component Value Date    WBC 3.3 07/31/2014    WBC 2.8 07/19/2014    HGB 8.4 07/31/2014    HCT 26.1 07/31/2014    MCV 104.3 07/31/2014    PLT 48 07/31/2014     Lab Results   Component Value Date    NA 136 07/30/2014    K 5.0 07/30/2014    CL 109 07/30/2014    CO2 22 07/30/2014    PHOS 2.9 04/06/2010    BUN 23 07/30/2014    CREATININE 0.9 07/30/2014      No components found for: GLU    Imaging:      1. Multifocal airspace opacities in the lower lobes, right middle lobe, and   lingula have an appearance compatible with atelectasis, with or without   pneumonia. Cluster of nodular densities  in the lateral right upper lobe most   likely represents atypical infection. 1.3 cm nodule in the superior segment   of the right lower lobe warrants six-month follow-up   2. Interval decrease in right pleural effusion   3. Hepatic cirrhosis with large venous collaterals adjacent to the spleen   4. Multinodular thyroid        Right   Acute totally occluding deep vein thrombosis involving the right proximal PTV   and one gastroc vein zone 5-6.   Acute partially occluding deep vein thrombosis involving the popliteal vein   and right one gastroc vein zone 5-6.   No other evidence of deep vein or superficial vein thrombosis involving the   right lower extremity and the left common femoral vein.   Calf veins were not well visualized on the right due to patient positioning   and edema.           Assessment:   RLE DVT  Left gluteal hematoma  Myelodysplastic syndrome      Plan:  Pt with RLE DVT and left gluteal hematoma preventing on going anticoagulation.  Agree that he is a good candidate for IVC filter placement and discussed with wife this am.  Procedure and risks discussed in detail.    Please keep NPO for filter placement this am if family agrees    Sincerely appreciate the consultation.      Gordie Crumby B. Lucendia Herrlich M.D., FACS.  07/31/2014  6:26 AM

## 2014-07-31 NOTE — Progress Notes (Signed)
Progress Note - Dr. Tasia Catchings - Internal Medicine  PCP: Alecia Lemming, DO [place] / [place] [address] [phone]    Hospital Day: 6  Code Status: Full Code  Current Diet: DIET GENERAL; No Added Salt (3-4 GM)        CC: follow up on medical issues    Subjective:   Andrew [name] is a 74 y.o. male.    He denies problems    Doing about the same  D/W  Surg yest; no surgical tx for hematoma recommended  Stop lovenox (for dvt) given bleeding  Appreciate DR Lucendia Herrlich eval - likely for IVC filter placement today    Hgb this am 7.5      He does not really cooperate with ROS    I have reviewed the patient's medical and social history in detail and updated the computerized patient record.  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 He  has past surgical history that includes AV fistula repair (1970's); Colonoscopy (4/12); Cardiac catheterization; eye surgery (04/12/2014); Cosmetic surgery (04/2013); bone marrow biopsy; and knee surgery.[name] He  reports that he has never smoked. He has never used smokeless tobacco. He reports that he does not drink alcohol or use illicit drugs..        Active Hospital Problems    Diagnosis Date Noted   ??? Hematoma [T14.8] 07/31/2014   ??? Pneumonia of both lungs due to infectious organism [J18.9]    ??? Traumatic hematoma of buttock [S30.0XXA]    ??? Pneumonia [J18.9] 07/25/2014   ??? 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 DVT (deep venous thrombosis) (HCC) [I82.409] 07/10/2014   ??? Hepatic encephalopathy  (HCC) [K72.90] 07/07/2014   ??? Benign essential HTN [I10] 07/03/2014       Current facility-administered medications: 0.9 % sodium chloride bolus, 250 mL, Intravenous, Once  lactulose (CHRONULAC) 10 GM/15ML solution 20 g, 20 g, Oral, TID  amoxicillin-clavulanate (AUGMENTIN) 875-125 MG per tablet 1 tablet, 1 tablet, Oral, 2 times per day  ioversol (OPTIRAY) 68 % injection 100 mL, 100 mL, Intravenous, ONCE PRN  sodium chloride (PF) 0.9 % injection 10 mL, 10 mL, Intravenous, PRN  carvedilol (COREG) tablet 6.25 mg, 6.25 mg, Oral, BID WC  vitamin D (CHOLECALCIFEROL) tablet 1,000 Units, 1,000 Units, Oral, Daily  citalopram (CELEXA) tablet 10 mg, 10 mg, Oral, Daily  eplerenone (INSPRA) tablet 50 mg, 50 mg, Oral, Daily  furosemide (LASIX) tablet 40 mg, 40 mg, Oral, Daily  lisinopril (PRINIVIL;ZESTRIL) tablet 5 mg, 5 mg, Oral, Nightly  magnesium oxide (MAG-OX) tablet 400 mg, 400 mg, Oral, Daily  rifaximin (XIFAXAN) tablet 550 mg, 550 mg, Oral, BID  traMADol (ULTRAM) tablet 50 mg, 50 mg, Oral, Q12H PRN  0.9 % sodium chloride infusion, , Intravenous, Continuous  sodium chloride flush 0.9 % injection 10 mL, 10 mL, Intravenous, 2 times per day  sodium chloride flush 0.9 % injection 10 mL, 10 mL, Intravenous, PRN  acetaminophen (TYLENOL) tablet 650 mg, 650 mg, Oral, Q4H PRN  HYDROcodone-acetaminophen (NORCO) 5-325 MG per tablet 1 tablet, 1 tablet, Oral, Q4H PRN **OR** HYDROcodone-acetaminophen (NORCO) 5-325 MG per tablet 2 tablet, 2 tablet, Oral, Q4H PRN  morphine (PF) injection 2 mg, 2 mg, Intravenous, Q2H PRN **OR** morphine (PF) injection 4 mg, 4 mg, Intravenous, Q2H PRN  magnesium hydroxide (MILK OF MAGNESIA) 400 MG/5ML suspension 30 mL, 30 mL, Oral, Daily PRN  ondansetron (ZOFRAN) injection 4 mg, 4 mg, Intravenous, Q6H PRN  potassium chloride SA (K-DUR;KLOR-CON M) tablet 40 mEq, 40 mEq, Oral, PRN **OR** potassium chloride 20 MEQ/15ML (10%) oral solution 40 mEq, 40 mEq, Oral, PRN **OR** potassium chloride 10 mEq/100 mL IVPB  (Peripheral Line), 10 mEq, Intravenous, PRN         Objective:  BP 99/64 mmHg   Pulse 92   Temp(Src) 97.8 ??F (36.6 ??C) (Temporal)   Resp 18   Ht $R'5\' 5"'aU$  (1.651 m)   Wt 186 lb 14.4 oz (84.777 kg)   BMI 31.10 kg/m2   SpO2 97%     Patient Vitals for the past 24 hrs:   BP Temp Temp src Pulse Resp SpO2   07/31/14 0614 99/64 mmHg 97.8 ??F (36.6 ??C) Temporal 92 18 -   07/30/14 2345 107/74 mmHg 98.8 ??F (37.1 ??C) Temporal 75 18 97 %   07/30/14 2215 106/73 mmHg 98.4 ??F (36.9 ??C) Temporal 76 18 -   07/30/14 2130 97/62 mmHg 98.6 ??F (37 ??C) Temporal 76 18 96 %   07/30/14 2103 90/56 mmHg 98.8 ??F (37.1 ??C) Temporal 75 18 97 %   07/30/14 2025 97/63 mmHg 98.8 ??F (37.1 ??C) Temporal 76 18 -   07/30/14 1839 96/64 mmHg 98.4 ??F (36.9 ??C) Temporal 78 18 97 %   07/30/14 1731 (!) 85/53 mmHg 98.4 ??F (36.9 ??C) Temporal 77 18 96 %   07/30/14 1716 93/59 mmHg 98.2 ??F (36.8 ??C) Temporal 78 18 96 %   07/30/14 1712 94/63 mmHg 98.3 ??F (36.8 ??C) Temporal 73 18 97 %   07/30/14 1545 (!) 82/54 mmHg 98.2 ??F (36.8 ??C) Temporal 77 18 95 %   07/30/14 0840 95/57 mmHg 98.4 ??F (36.9 ??C) Temporal 81 16 97 %   07/30/14 0805 - - - - - 98 %     Patient Vitals for the past 96 hrs (Last 3 readings):   Weight   07/30/14 0715 186 lb 14.4 oz (84.777 kg)   07/29/14 0724 176 lb 8 oz (80.06 kg)   07/28/14 0747 176 lb 14.4 oz (80.241 kg)           Intake/Output Summary (Last 24 hours) at 07/31/14 0751  Last data filed at 07/30/14 2215   Gross per 24 hour   Intake    660 ml   Output      0 ml   Net    660 ml         Physical Exam:   S1, S2 normal, no murmur, rub or gallop, regular rate and rhythm  clear to auscultation bilaterally  abdomen is soft without significant tenderness, masses, organomegaly or guarding  extremities normal, atraumatic, no cyanosis or edema    Labs:  Lab Results   Component Value Date    WBC 2.9* 07/31/2014    HGB 7.5* 07/31/2014    HCT 22.9* 07/31/2014    PLT 43* 07/31/2014    CHOL 101 04/02/2014    TRIG 83 04/02/2014    HDL 22* 04/02/2014    ALT 15  07/31/2014    AST 32 07/31/2014    NA 132* 07/31/2014    K 4.8 07/31/2014    CL 106 07/31/2014    CREATININE 0.8 07/31/2014    BUN  28* 07/31/2014    CO2 20* 07/31/2014    TSH 2.15 08/15/2013    PSA 0.55 08/15/2013    INR 1.37* 07/25/2014    LABA1C 5.0 01/10/2014    LABMICR YES 07/25/2014     Lab Results   Component Value Date    TROPONINI <0.01 07/25/2014       Recent Imaging Results are Reviewed:  Xr Chest Standard Two Vw    07/27/2014   EXAMINATION: TWO VIEWS OF THE CHEST  07/27/2014 5:52 pm  COMPARISON: 07/25/2014  HISTORY: ORDERING SYSTEM PROVIDED HISTORY: follow up pneumonia TECHNOLOGIST PROVIDED HISTORY: Ordering Physician Provided Reason for Exam: follow up pneumonia Acuity: Acute Type of Exam: Initial  FINDINGS: Compression fracture of the lower thoracic spine is again seen.  Minimal right base atelectasis is noted.  There is scarring on the left.  Remote rib fractures are seen.  Cardiac silhouette within normal limits.  There is a hiatal hernia     07/27/2014   IMPRESSION: No acute disease     Xr Chest Standard Two Vw    07/25/2014   EXAMINATION: TWO VIEWS OF THE CHEST  07/25/2014 8:17 am  COMPARISON: Chest x-ray Jul 07, 2014  HISTORY: ORDERING SYSTEM PROVIDED HISTORY: generalized weakness, hypotension TECHNOLOGIST PROVIDED HISTORY: Ordering Physician Provided Reason for Exam: weakness Acuity: Unknown Type of Exam: Unknown  FINDINGS: There is new focal density of the right upper lobe above the hilum.  There also is new faint airspace disease of the lateral right lung base.  There is new focal opacity of the retrocardiac left lower lobe.  There is blunting of the left costophrenic angle consistent with pleural fluid.     07/25/2014   IMPRESSION: New bilateral multifocal airspace disease, most likely atelectasis or pneumonia although follow-up to ensure resolution is recommended.  New small to moderate size left pleural effusion.     Ct Head Wo Contrast    07/07/2014   EXAMINATION: CT OF THE HEAD WITHOUT CONTRAST   07/07/2014 11:11 pm  TECHNIQUE: CT of the head was performed without the administration of intravenous contrast.  COMPARISON: 04/01/2014  HISTORY: ORDERING SYSTEM PROVIDED HISTORY: dizzy TECHNOLOGIST PROVIDED HISTORY: Ordering Physician Provided Reason for Exam: dizzy  FINDINGS: BRAIN/VENTRICLES: There is no acute intracranial hemorrhage, mass effect or midline shift.  No abnormal extra-axial fluid collection.  The gray-white differentiation is maintained without evidence of an acute infarct.  There is no evidence of hydrocephalus.  ORBITS: The visualized portion of the orbits demonstrate no acute abnormality.  SINUSES: There is minimal mucosal thickening in the left maxillary sinus. The remaining visualized paranasal sinuses are clear.  The mastoid air cells are unremarkable.  SOFT TISSUES/SKULL:  No acute abnormality of the visualized skull or soft tissues.     07/07/2014   IMPRESSION: No acute intracranial abnormality.     Ct Chest W Contrast    07/26/2014   EXAMINATION: CT OF THE CHEST WITH CONTRAST 07/26/2014 2:14 pm  TECHNIQUE: CT of the chest was performed with the administration of intravenous contrast. Multiplanar reformatted images are provided for review. Dose modulation, iterative reconstruction, and/or weight based adjustment of the mA/kV was utilized to reduce the radiation dose to as low as reasonably achievable.  COMPARISON: 06/27/2014  HISTORY: ORDERING SYSTEM PROVIDED HISTORY: SHORTNESS OF BREATH  FINDINGS: Mediastinum: Stable mediastinal lymph nodes, no convincing adenopathy. Thoracic aorta normal in caliber.  No pericardial effusion.  .  Lungs/pleura: Interval decrease in right pleural effusion.  Stable trace left pleural effusion.  There are  multifocal airspace opacities that are predominantly bandlike in morphology, mostly in the lower lobes, right middle lobe, and lingula.  There are a cluster of tiny nodules in the lateral right upper lobe.  There is a stable 1.3 cm nodule in the superior segment  of the right lower lobe.  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  There is a tiny nonobstructing right renal stone.  Large venous collaterals are noted adjacent to the spleen  Soft Tissues/Bones: There is bilateral gynecomastia.  There are multiple thyroid nodules     07/26/2014   IMPRESSION: 1. Multifocal airspace opacities in the lower lobes, right middle lobe, and lingula have an appearance compatible with atelectasis, with or without pneumonia.  Cluster of nodular densities in the lateral right upper lobe most likely represents atypical infection.  1.3 cm nodule in the superior segment of the right lower lobe warrants six-month follow-up 2. Interval decrease in right pleural effusion 3. 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  LIVER: Evaluation of the liver is limited.  There is diffuse increased echogenicity with no focal lesion noted.  Somewhat nodular contour of the liver is compatible with known cirrhosis.  BILIARY SYSTEM: Limited imaging of the gallbladder is grossly unremarkable.  Common bile duct is within normal limits measuring 6 mm.  KIDNEYS: Right kidney is nonvisualized.  Left kidney measures 10.1 x 4.9 x 5.3 cm and is grossly unremarkable.  PANCREAS: Pancreas is nonvisualized.  SPLEEN: Spleen measures 10.1 cm.  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  IVC: The IVC is patent.  AORTA: Aorta is obscured.  OTHER: No evidence of ascites.     07/08/2014   IMPRESSION: Limited study as above.  Cirrhosis and portal hypertension.  No evidence of ascites.     Ct Abdomen Pelvis W Iv Contrast Additional Contrast?:  Oral    07/30/2014   EXAMINATION: CT OF THE ABDOMEN AND PELVIS WITH CONTRAST 07/30/2014 11:54 am  TECHNIQUE: CT of the abdomen and pelvis was performed with the administration of intravenous contrast. Multiplanar reformatted images are provided for review. Dose modulation, iterative reconstruction, and/or weight based adjustment of the mA/kV was utilized to reduce the radiation dose to as low as reasonably achievable.  COMPARISON: 09/07/2010  HISTORY: ORDERING SYSTEM PROVIDED HISTORY: ABD PAIN, FEVER, NO RECENT SURGERY TECHNOLOGIST PROVIDED HISTORY: Additional Contrast?->Oral Ordering Physician Provided Reason for Exam: ABD PAIN, FEVER, NO RECENT SURGERY Acuity: Unknown Type of Exam: Unknown  Unexplained anemia.  Evaluate for retroperitoneal hemorrhage.  FINDINGS: Lower Chest: There is mild bibasilar pleural and parenchymal disease, new from the prior study.  Organs: The liver is shrunken with a nodular contour consistent with cirrhosis.  No focal liver lesion.  The main portal vein is small in size and grossly patent.  There is a recannulized umbilical vein.  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  GI/Bowel: Bowel loops are unremarkable.  The gallbladder is moderately distended.  There are no calcified gallstones.  Pelvis: Urinary bladder, prostate gland and seminal vesicles are unremarkable.  Peritoneum/Retroperitoneum: No adenopathy, free air or free fluid.  Bones/Soft Tissues: There is bilateral flank edema.  There is a large  left gluteus muscle mass/hematoma extending into the left upper thigh.     07/30/2014   IMPRESSION: Large left gluteus muscle mass/hematoma likely accounting for the unexplained anemia.  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     Xr Chest Portable    07/08/2014   EXAMINATION: SINGLE VIEW OF THE CHEST  07/07/2014 11:34 pm  COMPARISON: 06/03/2014  HISTORY: ORDERING SYSTEM PROVIDED HISTORY: other TECHNOLOGIST PROVIDED HISTORY: Ordering Physician  Provided Reason for Exam: dizziness Acuity: Unknown Type of Exam: Unknown  FINDINGS: Cardiac leads project over the chest.  Diffuse interstitial opacity is demonstrated bilaterally.  Heterogeneous opacity is seen adjacent to the left heart border.  No evidence of pleural effusion.  Negative for pneumothorax. Cardiac and mediastinal silhouettes are similar to prior.  Old posterior left 7th rib fracture.     07/08/2014   IMPRESSION: Mild pulmonary edema.     Vl Extremity Venous Right    07/10/2014   Lower Extremities DVT Study   Demographics    Patient Name      GARL SPEIGNER    Date of Study     07/09/2014          Gender              Male    Patient Number    [id number]          Date of Birth       07-03-1940    Visit Number      [id number]         Age                 82 year(s)    Accession Number  [id number]           Room Number         3710    Corporate ID      [id number]            Sonographer         Erasmo Leventhal,                                                            RVS    Ordering          Deniece Portela,   Interpreting        [facility]  Physician         MD                  Physician           Peggyann Shoals,                                                            MD, [facility] [place]   Procedure  Type of Study:    Veins:Lower Extremities DVT Study, VL EXTREMITY VENOUS DUPLEX RIGHT.    Generic Orders:VL EXTREMITY VENOUS DUPLEX RIGHT.    Vascular Sonographer Report   Additional Indications:Swelling  Impressions Right Impression Acute totally occluding deep vein thrombosis involving the right proximal PTV and one gastroc vein zone 5-6. Acute partially occluding deep  vein thrombosis involving the popliteal vein and right one gastroc vein zone 5-6. No other evidence of deep vein or superficial vein thrombosis involving the right lower extremity and the left common femoral vein. Calf veins were not well visualized on the right due to patient positioning and edema.  Verbal to [employer].  Conclusions     Summary    Acute totally occluding deep vein thrombosis involving the right proximal  PTV and one gastroc vein zone 5-6.  Acute partially occluding deep vein thrombosis involving the popliteal vein  and right one gastroc vein zone 5-6.  Calf veins were not well visualized on the right due to patient positioning  and edema.    Signature    ------------------------------------------------------------------  Electronically signed by Peggyann Shoals, MD, [facility], RPVI  (Interpreting physician) on 07/10/2014 at 11:34 AM  ------------------------------------------------------------------   Patient Status:Routine. [facility]. Technical Quality:Poor visualization.  Risk Factors History +------------------+----------+----------------------------------------------+ !Diagnosis         !Date      !Comments                                      ! +------------------+----------+----------------------------------------------+ !Previous Procedure!06/27/2014!Venous Doppler Bilateral: Acute DVT right     ! !                  !          !gastroc veins.                                ! +------------------+----------+----------------------------------------------+  Velocities are measured in cm/s ; Diameters are measured in mm  Right Lower Extremities DVT Study Measurements Right 2D Measurements +------------------------+----------+---------------+----------+ !Location                !Visualized!Compressibility!Thrombosis! +------------------------+----------+---------------+----------+ !Sapheno Femoral Junction!Yes       !Yes            !None      ! +------------------------+----------+---------------+----------+ !GSV Thigh               !Yes       !Yes            !None      ! +------------------------+----------+---------------+----------+ !Common Femoral          !Yes       !Yes            !None      ! +------------------------+----------+---------------+----------+ !Prox Femoral             !Yes       !Yes            !None      ! +------------------------+----------+---------------+----------+ !Mid Femoral             !Yes       !Yes            !None      ! +------------------------+----------+---------------+----------+ !Dist Femoral            !Yes       !Yes            !None      ! +------------------------+----------+---------------+----------+ !Deep Femoral            !Yes       !Yes            !  None      ! +------------------------+----------+---------------+----------+ !Popliteal               !Yes       !Partial        !Acute     ! +------------------------+----------+---------------+----------+ !GSV Below Knee          !Yes       !Yes            !None      ! +------------------------+----------+---------------+----------+ !Gastroc                 !Yes       !Partial        !Acute     ! +------------------------+----------+---------------+----------+ !PTV                     !Yes       !No             !Acute     ! +------------------------+----------+---------------+----------+ !Peroneal                !Yes       !Yes            !None      ! +------------------------+----------+---------------+----------+ !SSV                     !Yes       !Yes            !None      ! +------------------------+----------+---------------+----------+  Right Doppler Measurements +--------------+------+------+------------+ !Location      !Signal!Reflux!Reflux (sec)! +--------------+------+------+------------+ !Common Femoral!Phasic!      !            ! +--------------+------+------+------------+ !Femoral       !Phasic!      !            ! +--------------+------+------+------------+ !Deep Femoral  !Phasic!      !            ! +--------------+------+------+------------+ !Popliteal     !Phasic!      !            ! +--------------+------+------+------------+  Left Lower Extremities DVT Study Measurements Left 2D Measurements +------------------------+----------+---------------+----------+ !Location                 !Visualized!Compressibility!Thrombosis! +------------------------+----------+---------------+----------+ !Sapheno Femoral Junction!Yes       !Yes            !None      ! +------------------------+----------+---------------+----------+ !Common Femoral          !Yes       !Yes            !None      ! +------------------------+----------+---------------+----------+  Left Doppler Measurements +--------------+------+------+------------+ !Location      !Signal!Reflux!Reflux (sec)! +--------------+------+------+------------+ !Common Femoral!Phasic!      !            ! +--------------+------+------+------------+    Vl Extremity Venous Bilateral    07/09/2014   Vascular Lower Extremities DVT Study Procedure  -- PRELIMINARY SONOGRAPHER REPORT --    Demographics    Patient Name      HAYLEN SHELNUTT    Date of Study     07/09/2014          Gender              Male    Patient Number    [id number]          Date of  Birth       1941/02/22    Visit Number      [id number]         Age                 22 year(s)    Accession Number  [id number]           Room Number         6294    Corporate ID      [id number]            Sonographer         Erasmo Leventhal,                                                            RVS    Ordering          Deniece Portela,   Interpreting        [facility]  Physician         MD                  Physician   Procedure  Type of Study:    Veins:Lower Extremities DVT Study, VL EXTREMITY VENOUS DUPLEX RIGHT.   Tech Comments Right Acute totally occluding deep vein thrombosis involving the right proximal PTV and one gastroc vein zone 5-6. Acute partially occluding deep vein thrombosis involving the popliteal vein and right one gastroc vein zone 5-6. No other evidence of deep vein or superficial vein thrombosis involving the right lower extremity and the left common femoral vein. Calf veins were not well visualized on the right due to patient positioning and edema.  verbal to [employer].      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

## 2014-07-31 NOTE — Progress Notes (Signed)
[facility] FOLLOW-UP:     Primary Oncologist: Dr. Eulis Canner  Date: 07/31/2014    PCP: Alecia Lemming, DO  Referring Provider: No ref. provider found    PROBLEM LIST:     Patient Active Problem List   Diagnosis   ??? Erectile dysfunction   ??? Thrombocytopenia   ??? Edema   ??? BPH (benign prostatic hyperplasia)   ??? Alpha-1-antitrypsin deficiency   ??? Cirrhosis, nonalcoholic ([place])   ??? Mild CAD   ??? Chronic systolic CHF (congestive heart failure) ([place])   ??? Non-ischemic cardiomyopathy ([place])   ??? Lumbar compression fracture ([place])   ??? Chronic low back pain   ??? Osteopenia   ??? Coagulopathy ([place])   ??? Abnormal EKG   ??? Visual changes   ??? Fixed pupil of right eye   ??? Anemia   ??? Hemispheric carotid artery syndrome   ??? CAD in native artery   ??? Hyperlipidemia   ??? 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 Bronchiectasis without complication ([place])   ??? Benign essential HTN   ??? Hepatic encephalopathy (H[facility])   ??? Ataxia   ??? Pancytopenia ([place])   ??? DVT (deep venous thrombosis) ([place])   ??? 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 Pneumonia   ??? Traumatic hematoma of buttock   ??? Pneumonia of both lungs due to infectious organism   ??? Hematoma       Specialty Problems        Oncology Problems    Myelodysplasia (myelodysplastic syndrome) (HCC)              INTERVAL HISTORY     Feeling better but weak.     REVIEW OF SYSTEMS:     Weak.  Minimal pain.  Denies chest pain or shortness of breath.     PHYSICAL EXAM:     BP 99/64 mmHg   Pulse 92   Temp(Src) 97.8 ??F (36.6 ??C) (Temporal)   Resp 18   Ht 5\' 5"  (1.651 m)   Wt 189 lb 11.2 oz (86.047 kg)   BMI 31.57 kg/m2   SpO2 97%    WEIGHT:   Wt Readings from Last 3 Encounters:   07/31/14 189 lb 11.2 oz (86.047 kg)   07/24/14 171 lb (77.565 kg)   07/19/14 173 lb 6.4 oz (78.654 kg)     GENERAL APPEARANCE: alert and cooperative, ill-appearing.    HEAD: Normocephalic, without obvious abnormality, atraumatic  NECK: No palpable lymphadenopathy in supraclavicular or cervical chains  LUNGS: Clear to auscultation bilaterally,  no audible rales, wheezes or crackles  HEART: Regular rate and rhythm, S1, S2 normal  ABDOMEN: Soft, somewhat distended.  EXTREMITIES: without cyanosis, clubbing, or asymmetry. Leg edema.  Hematoma of buttock.  SKIN: No jaundice, purpura or petechiae    LABS:     CBC:  Lab Results   Component Value Date    WBC 2.9* 07/31/2014    HGB 7.5* 07/31/2014    HCT 22.9* 07/31/2014    MCV 101.6* 07/31/2014    PLT 43* 07/31/2014    LABLYMP 1.6 07/19/2014    MID 0.4 07/19/2014    GRAN 0.8* 07/19/2014    LYMPHOPCT 46.1 07/31/2014    MIDPERCENT 13.4 07/19/2014    GRANULOCYTES 29.6* 07/19/2014    RBC 2.26* 07/31/2014    MCH 33.3 07/31/2014    MCHC 32.7 07/31/2014    RDW 18.5* 07/31/2014       Lab Results   Component Value Date    NA 132* 07/31/2014  K 4.8 07/31/2014    CL 106 07/31/2014    CO2 20* 07/31/2014    BUN 28* 07/31/2014    CREATININE 0.8 07/31/2014    GLUCOSE 114* 07/31/2014    CALCIUM 7.7* 07/31/2014    PROT 4.6* 07/31/2014    LABALBU 1.6* 07/31/2014    BILITOT 1.6* 07/31/2014    ALKPHOS 64 07/31/2014    AST 32 07/31/2014    ALT 15 07/31/2014    LABGLOM >60 07/31/2014    GFRAA >60 07/31/2014    AGRATIO 0.4* 07/26/2014    GLOB 3.7 07/26/2014       No results found for: PTINR    TUMOR MARKERS:    Lab Results   Component Value Date    PSA 0.55 08/15/2013         IMAGING:     Xr Chest Standard Two Vw    07/25/2014   EXAMINATION: TWO VIEWS OF THE CHEST  07/25/2014 8:17 am  COMPARISON: Chest x-ray Jul 07, 2014  HISTORY: ORDERING SYSTEM PROVIDED HISTORY: generalized weakness, hypotension TECHNOLOGIST PROVIDED HISTORY: Ordering Physician Provided Reason for Exam: weakness Acuity: Unknown Type of Exam: Unknown  FINDINGS: There is new focal density of the right upper lobe above the hilum.  There also is new faint airspace disease of the lateral right lung base.  There is new focal opacity of the retrocardiac left lower lobe.  There is blunting of the left costophrenic angle consistent with pleural fluid.     07/25/2014    IMPRESSION: New bilateral multifocal airspace disease, most likely atelectasis or pneumonia although follow-up to ensure resolution is recommended.  New small to moderate size left pleural effusion.     Ct Head Wo Contrast    07/07/2014   EXAMINATION: CT OF THE HEAD WITHOUT CONTRAST  07/07/2014 11:11 pm  TECHNIQUE: CT of the head was performed without the administration of intravenous contrast.  COMPARISON: 04/01/2014  HISTORY: ORDERING SYSTEM PROVIDED HISTORY: dizzy TECHNOLOGIST PROVIDED HISTORY: Ordering Physician Provided Reason for Exam: dizzy  FINDINGS: BRAIN/VENTRICLES: There is no acute intracranial hemorrhage, mass effect or midline shift.  No abnormal extra-axial fluid collection.  The gray-white differentiation is maintained without evidence of an acute infarct.  There is no evidence of hydrocephalus.  ORBITS: The visualized portion of the orbits demonstrate no acute abnormality.  SINUSES: There is minimal mucosal thickening in the left maxillary sinus. The remaining visualized paranasal sinuses are clear.  The mastoid air cells are unremarkable.  SOFT TISSUES/SKULL:  No acute abnormality of the visualized skull or soft tissues.     07/07/2014   IMPRESSION: No acute intracranial abnormality.     US Abdomen Complete    07/08/2014   EXAMINATION: COMPLETE ABDOMINAL ULTRASOUND 07/08/2014 6:04 pm  COMPARISON: None  HISTORY: ORDERING SYSTEM PROVIDED HISTORY: CIRRHOSIS TECHNOLOGIST PROVIDED HISTORY: Ordering Physician Provided Reason for Exam: cirrhosis, r/o ascites Acuity: Acute Type of Exam: Initial  FINDINGS: Study is limited by body habitus.  LIVER: Evaluation of the liver is limited.  There is diffuse increased echogenicity with no focal lesion noted.  Somewhat nodular contour of the liver is compatible with known cirrhosis.  BILIARY SYSTEM: Limited imaging of the gallbladder is grossly unremarkable.  Common bile duct is within normal limits measuring 6 mm.  KIDNEYS: Right kidney is nonvisualized.  Left kidney  measures 10.1 x 4.9 x 5.3 cm and is grossly unremarkable.  PANCREAS: Pancreas is nonvisualized.  SPLEEN: Spleen measures 10.1 cm.  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  IVC: The IVC is patent.  AORTA: Aorta is obscured.  OTHER: No evidence of ascites.     07/08/2014   IMPRESSION: Limited study as above.  Cirrhosis and portal hypertension.  No evidence of ascites.     Xr Chest Portable    07/08/2014   EXAMINATION: SINGLE VIEW OF THE CHEST  07/07/2014 11:34 pm  COMPARISON: 06/03/2014  HISTORY: ORDERING SYSTEM PROVIDED HISTORY: other TECHNOLOGIST PROVIDED HISTORY: Ordering Physician Provided Reason for Exam: dizziness Acuity: Unknown Type of Exam: Unknown  FINDINGS: Cardiac leads project over the chest.  Diffuse interstitial opacity is demonstrated bilaterally.  Heterogeneous opacity is seen adjacent to the left heart border.  No evidence of pleural effusion.  Negative for pneumothorax. Cardiac and mediastinal silhouettes are similar to prior.  Old posterior left 7th rib fracture.     07/08/2014   IMPRESSION: Mild pulmonary edema.     Vl Extremity Venous Right    07/10/2014   Lower Extremities DVT Study   Demographics    Patient Name      Andrew [name]    Date of Study     07/09/2014          Gender              Male    Patient Number    [id number]          Date of Birth       Mar 25, 1940    Visit Number      [id number]         Age                 27 year(s)    Accession Number  [id number]           Room Number         5277    Corporate ID      [id number]            Sonographer         Erasmo Leventhal,                                                            R[facility]    Ordering          Deniece Portela,   Interpreting        [facility]  Physician         MD                  Physician           Peggyann Shoals,                                                            MD, [facility], [place]   Procedure  Type of Study:    Veins:Lower Extremities DVT Study, VL EXTREMITY VENOUS DUPLEX RIGHT.    Generic Orders:VL EXTREMITY VENOUS DUPLEX RIGHT.     Vascular Sonographer Report   Additional Indications:Swelling  Impressions Right Impression Acute totally occluding deep vein thrombosis involving the right proximal PTV and one gastroc vein zone 5-6. Acute partially occluding deep vein thrombosis involving the popliteal vein  and right one gastroc vein zone 5-6. No other evidence of deep vein or superficial vein thrombosis involving the right lower extremity and the left common femoral vein. Calf veins were not well visualized on the right due to patient positioning and edema.  Verbal to [employer].  Conclusions    Summary    Acute totally occluding deep vein thrombosis involving the right proximal  PTV and one gastroc vein zone 5-6.  Acute partially occluding deep vein thrombosis involving the popliteal vein  and right one gastroc vein zone 5-6.  Calf veins were not well visualized on the right due to patient positioning  and edema.    Signature    Electronically signed by Peggyann Shoals, MD, [facility], RPVI  (Interpreting physician) on 07/10/2014 at 11:34 AM    Cta Pulmonary With Contrast    06/27/2014   EXAMINATION: CTA OF THE CHEST 06/27/2014 2:53 pm  TECHNIQUE: CTA of the chest was performed after the administration of intravenous contrast.  Multiplanar reformatted images are provided for review.  MIP images are provided for review.  Patient received 100 mL Isovue 370 intravenous contrast.  COMPARISON: CT chest with contrast June 13, 2014.  HISTORY: ORDERING SYSTEM PROVIDED HISTORY: DVT (deep venous thrombosis), unspecified laterality [facility]) TECHNOLOGIST PROVIDED HISTORY: Ordering Physician Provided Reason for Exam: DVT, SOB Injury/Trauma or Illness: Illness/Other Acuity: Acute Type of Exam: Initial Relevant Medica/Surgical History: CHF, cardiomyopathy  FINDINGS: Pulmonary Arteries: Pulmonary arteries are adequately opacified for evaluation.  No evidence of intraluminal filling defect to suggest pulmonary embolism.  Main pulmonary artery is normal in caliber.   Mediastinum: No evidence of mediastinal lymphadenopathy.  Heart is enlarged, similar in appearance.  The heart and pericardium demonstrate no acute abnormality.  There is no acute abnormality of the thoracic aorta. Nodularity of thyroid similar in appearance.  Lungs/pleura: The central airways are patent.  There is a moderate right-sided pleural effusion which appears slightly decreased compared to prior study.  There is mild patchy dependent opacities in the right upper lobe, similar in appearance.  There are dependent subpleural opacities within the lingula and left lower lobe.  There is mild opacity within the right middle lobe which is linear in appearance in appears slightly improved compared to prior study.  2 nodular appearing densities are again seen in the right lower lobe on image number 65, unchanged.  No pneumothorax.  Upper Abdomen: Cirrhotic liver with mildly distended gallbladder is similar in appearance to prior study.  Soft Tissues/Bones: Multiple compression fractures again seen, most pronounced at the thoracolumbar junction, similar in appearance.  Old right-sided rib fractures are also seen.  No acute bone or soft tissue abnormality.  Gynecomastia is again seen.     06/27/2014   IMPRESSION: No evidence of pulmonary embolism.  Moderate right pleural effusion, minimally decreased.  Persistent patchy areas of airspace opacity which may be related to atelectasis versus pneumonia, slightly improved.  2 nodular appearing densities are seen within the right lower lobe which may be related to the airspace disease or represent lung nodules.  Follow-up recommended with dedicated chest CT in 3 months.  Remote compression fractures again seen.  Cirrhotic liver.     Vl Extremity Venous Bilateral    07/09/2014   Vascular Lower Extremities DVT Study Procedure  -- PRELIMINARY SONOGRAPHER REPORT --    Demographics    Patient Name      Andrew [name]    Date of Study     07/09/2014  Gender              Male     Patient Number    [id number]          Date of Birth       [birth year]    Visit Number      [id number]         Age                 48 year(s)    Accession Number  [id number]           Room Number         5284    Corporate ID      [id number]            Sonographer         Erasmo Leventhal,                                                            RVS    Ordering          Deniece Portela,   Interpreting        [facility]  Physician         MD                  Physician   Procedure  Type of Study:    Veins:Lower Extremities DVT Study, VL EXTREMITY VENOUS DUPLEX RIGHT.   Tech Comments Right Acute totally occluding deep vein thrombosis involving the right proximal PTV and one gastroc vein zone 5-6. Acute partially occluding deep vein thrombosis involving the popliteal vein and right one gastroc vein zone 5-6. No other evidence of deep vein or superficial vein thrombosis involving the right lower extremity and the left common femoral vein. Calf veins were not well visualized on the right due to patient positioning and edema.  verbal to [employer].    Vl Extremity Venous Bilateral    06/27/2014   Lower Extremities DVT Study   Demographics    Patient Name       Andrew [name]    Date of Study      06/27/2014        Gender              Male    Patient Number     [id number]        Date of Birth       March 05, 1941    Visit Number       [id number]       Age                 66 year(s)    Accession Number   [id number]         Room Number    Corporate ID       [id number]          Sonographer         Erasmo Leventhal,                                                           [place]  Ordering Physician Lacretia Leigh, MD  Interpreting        [facility]                                       Physician           Darlyn Chamber MD,                                                           [facility]   Procedure  Type of Study:    Veins:Lower Extremities DVT Study, VASC EXTREMITY VENOUS DUPLEX BILATERAL.    Vascular Sonographer Report   Additional  Indications:Swelliing  Impressions Right Impression Acute partially occluding deep vein thrombosis involving the right set of gastroc veins zone 5-6. No other evidence of deep vein or superficial vein thrombosis involving the right lower extremity. Reflux noted in the right FV. Left Impression No evidence of deep vein or superficial vein thrombosis involving the left lower extremity.  Verbal to Dr. Jeb Levering. Patient released.  Conclusions    Summary    -Acute partially occluding deep vein thrombosis involving the right set of  gastroc veins zone 5-6.  -Reflux noted in the right FV.    Signature    ------------------------------------------------------------------  Electronically signed by Darlyn Chamber MD, [facility] (Interpreting  physician) on 06/27/2014 at 01:38 PM  ------------------------------------------------------------------   Patient Status:Routine. [place] - Vascular Lab. Technical Quality:Adequate visualization.  Velocities are measured in cm/s ; Diameters are measured in mm  Right Lower Extremities DVT Study Measurements Right 2D Measurements Location                !Visualized!Compressibility!Thrombosis!     STAGING:     No matching staging information was found for the patient.    ASSESSMENT AND PLAN:     1.  Hepatic encephalopathy.  Alert today.  2.  Cirrhosis with anti-trypsin deficiency.  3.  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  4.  Acute leg DVT.  Lovenox discontinued because of hematoma. IVC filter today.  5.  Pneumonia.     Performance status poor and not improving.  Not sure about treating him with Vidaza at present.      Eulis Canner, M.D.; M.S.  Medical Oncology/Hematology  Phone: [phone]  Fax: [phone]    [facility]  [address] [place] [address] [place] [address]    [place] [address]    [place] [address]

## 2014-07-31 NOTE — Progress Notes (Signed)
Assessment complete, see doc flowsheet. Patient resting in bed, call light in reach, bed in lowest position, brake set. Patient denies any needs at this time. Patient encouraged to call if needs arise.  Pat Tico Crotteau RN

## 2014-07-31 NOTE — Progress Notes (Signed)
Patient to cath lab for IVC filter placement.

## 2014-07-31 NOTE — Progress Notes (Signed)
Patient returned from procedure. VSS, no s/s distress. Moderate amount of drainage at site +1 Pulses. Will continue to monitor.

## 2014-08-01 ENCOUNTER — Encounter: Attending: Critical Care Medicine | Primary: Internal Medicine

## 2014-08-01 LAB — PREPARE RBC (CROSSMATCH)

## 2014-08-01 LAB — CBC WITH AUTO DIFFERENTIAL
Basophils %: 0 %
Basophils Absolute: 0 10*3/uL (ref 0.0–0.2)
Eosinophils %: 1 %
Eosinophils Absolute: 0 10*3/uL (ref 0.0–0.6)
Hematocrit: 20.9 % — CL (ref 40.5–52.5)
Hemoglobin: 6.9 g/dL — CL (ref 13.5–17.5)
Lymphocytes %: 51 %
Lymphocytes Absolute: 1.2 10*3/uL (ref 1.0–5.1)
MCH: 33.7 pg (ref 26.0–34.0)
MCHC: 32.9 g/dL (ref 31.0–36.0)
MCV: 102.4 fL — ABNORMAL HIGH (ref 80.0–100.0)
MPV: 9 fL (ref 5.0–10.5)
Monocytes %: 12 %
Monocytes Absolute: 0.3 10*3/uL (ref 0.0–1.3)
Neutrophils %: 36 %
Neutrophils Absolute: 0.9 10*3/uL — CL (ref 1.7–7.7)
PLATELET SLIDE REVIEW: DECREASED
Platelets: 42 10*3/uL — ABNORMAL LOW (ref 135–450)
RBC: 2.04 M/uL — ABNORMAL LOW (ref 4.20–5.90)
RDW: 18.7 % — ABNORMAL HIGH (ref 12.4–15.4)
WBC: 2.4 10*3/uL — ABNORMAL LOW (ref 4.0–11.0)

## 2014-08-01 LAB — PROTIME-INR
INR: 1.33 — ABNORMAL HIGH (ref 0.85–1.16)
Protime: 15.2 s — ABNORMAL HIGH (ref 9.8–13.0)

## 2014-08-01 LAB — PREPARE PLATELETS

## 2014-08-01 LAB — BASIC METABOLIC PANEL
Anion Gap: 4 (ref 3–16)
BUN: 29 mg/dL — ABNORMAL HIGH (ref 7–20)
CO2: 23 mmol/L (ref 21–32)
Calcium: 7.9 mg/dL — ABNORMAL LOW (ref 8.3–10.6)
Chloride: 109 mmol/L (ref 99–110)
Creatinine: 0.8 mg/dL (ref 0.8–1.3)
GFR African American: >60 (ref >60)
GFR Non-African American: >60 (ref >60)
Glucose: 122 mg/dL — ABNORMAL HIGH (ref 70–99)
Potassium: 5.4 mmol/L — ABNORMAL HIGH (ref 3.5–5.1)
Sodium: 136 mmol/L (ref 136–145)

## 2014-08-01 LAB — HEMOGLOBIN AND HEMATOCRIT
Hematocrit: 23.2 % — ABNORMAL LOW (ref 40.5–52.5)
Hemoglobin: 7.7 g/dL — ABNORMAL LOW (ref 13.5–17.5)

## 2014-08-01 LAB — TYPE AND SCREEN
ABO/Rh: A NEG
Antibody Screen: NEGATIVE

## 2014-08-01 LAB — APTT: aPTT: 33.5 s (ref 25.2–36.4)

## 2014-08-01 MED ORDER — SODIUM CHLORIDE 0.9 % IV BOLUS
0.9 % | Freq: Once | INTRAVENOUS | Status: AC
Start: 2014-08-01 — End: 2014-08-02
  Administered 2014-08-01: 16:00:00 250 mL via INTRAVENOUS

## 2014-08-01 MED ORDER — SODIUM CHLORIDE 0.9 % IV BOLUS
0.9 % | Freq: Once | INTRAVENOUS | Status: DC
Start: 2014-08-01 — End: 2014-08-06

## 2014-08-01 MED ORDER — SODIUM CHLORIDE 0.9 % IV BOLUS
0.9 % | Freq: Once | INTRAVENOUS | Status: AC | PRN
Start: 2014-08-01 — End: 2014-08-01

## 2014-08-01 MED ORDER — SODIUM CHLORIDE 0.9 % IV BOLUS
0.9 % | Freq: Once | INTRAVENOUS | Status: AC
Start: 2014-08-01 — End: 2014-08-01
  Administered 2014-08-01: 14:00:00 250 mL via INTRAVENOUS

## 2014-08-01 MED FILL — SODIUM CHLORIDE 0.9 % IV SOLN: 0.9 % | INTRAVENOUS | Qty: 2000

## 2014-08-01 MED FILL — XIFAXAN 550 MG PO TABS: 550 MG | ORAL | Qty: 1

## 2014-08-01 MED FILL — FUROSEMIDE 40 MG PO TABS: 40 MG | ORAL | Qty: 1

## 2014-08-01 MED FILL — NORMAL SALINE FLUSH 0.9 % IV SOLN: 0.9 % | INTRAVENOUS | Qty: 10

## 2014-08-01 MED FILL — EPLERENONE 25 MG PO TABS: 25 MG | ORAL | Qty: 2

## 2014-08-01 MED FILL — LISINOPRIL 5 MG PO TABS: 5 MG | ORAL | Qty: 1

## 2014-08-01 MED FILL — CITALOPRAM HYDROBROMIDE 20 MG PO TABS: 20 MG | ORAL | Qty: 1

## 2014-08-01 MED FILL — LACTULOSE 10 GM/15ML PO SOLN: 10 GM/15ML | ORAL | Qty: 30

## 2014-08-01 MED FILL — AMOXICILLIN-POT CLAVULANATE 875-125 MG PO TABS: 875-125 MG | ORAL | Qty: 1

## 2014-08-01 MED FILL — VITAMIN D3 25 MCG (1000 UT) PO TABS: 25 MCG (1000 UT) | ORAL | Qty: 1

## 2014-08-01 MED FILL — SODIUM CHLORIDE 0.9 % IV SOLN: 0.9 % | INTRAVENOUS | Qty: 1000

## 2014-08-01 MED FILL — MAGNESIUM OXIDE 400 (241.3 MG) MG PO TABS: 400 (241.3 Mg) MG | ORAL | Qty: 1

## 2014-08-01 NOTE — Progress Notes (Signed)
Platelets infusing. VSS. No s/s distress. IV wnl.

## 2014-08-01 NOTE — Oncology Nurse Navigation (Signed)
Oncology and Hematology Care   Progress Note    08/01/2014    SUBJECTIVE: wife feels that patient is more alert. Did fine with placement of ivc filter. HAs worsening of ecchymoses down left leg. No other new problems    OBJECTIVE:    Physical Assessment:  Vitals:  BP 111/68 mmHg   Pulse 77   Temp(Src) 98.3 ??F (36.8 ??C) (Temporal)   Resp 16   Ht 5\' 5"  (1.651 m)   Wt 189 lb 11.2 oz (86.047 kg)   BMI 31.57 kg/m2   SpO2 95%   24HR INTAKE/OUTPUT:  No intake or output data in the 24 hours ending 08/01/14 0703    CONSTITUTIONAL:  Awake, alert & oriented x3  Slightly lethargic but appears somewhat better  HEENT: PERRL, Neck: soft, supple, no cervical, supraclavicular or axillary adenopathy  RESPIRATORY:  No increased work of breathing, breath sounds decreased.  CARDIOVASCULAR:  Cardiac S1/S2, RRR  GASTROINTESTINAL:  abdomen soft +BS x4, no hepatosplenomegaly  SKIN:  negative for rash and skin lesion(s)  MUSCULOSKELETAL:   Has pain and muscle weakness has bruising extending down left leg  EXTREMITIES: Negative for Lower extremity edema    Labs Results:    CBC:   Recent Labs      07/30/14   0635   07/31/14   0002  07/31/14   0617   07/31/14   1809  08/01/14   0014  08/01/14   0551   WBC  2.8*   --   3.3*  2.9*   --    --    --   2.4*   HGB  7.0*   < >  8.4*  7.5*   < >  7.4*  7.7*  6.9*   HCT  21.0*   < >  26.1*  22.9*   < >  22.8*  23.2*  20.9*   MCV  100.1*   --   104.3*  101.6*   --    --    --   102.4*   PLT  45*   --   48*  43*   --    --    --    --     < > = values in this interval not displayed.     BMP:   Recent Labs      07/30/14   0635  07/31/14   0617  08/01/14   0551   NA  136  132*  136   K  5.0  4.8  5.4*   CL  109  106  109   CO2  22  20*  23   BUN  23*  28*  29*   CREATININE  0.9  0.8  0.8     LIVER PROFILE:   Recent Labs      07/31/14   0617   AST  32   ALT  15   BILIDIR  0.5*   BILITOT  1.6*   ALKPHOS  64     PT/INR:    Lab Results   Component Value Date    PROTIME 15.7 07/25/2014    PROTIME 14.7  07/07/2014    PROTIME 16.1 04/02/2014    INR 1.37 07/25/2014    INR 1.28 07/07/2014    INR 1.40 04/02/2014    INR 1.17 05/14/2010    INR 1.18 02/13/2010    INR 1.15 10/17/2009     PTT:    Lab Results   Component Value  Date    APTT 46.5 07/25/2014    APTT 29.7 10/17/2009     UA:No results for input(s): NITRITE, COLORU, PHUR, LABCAST, WBCUA, RBCUA, MUCUS, TRICHOMONAS, YEAST, BACTERIA, CLARITYU, SPECGRAV, LEUKOCYTESUR, UROBILINOGEN, BILIRUBINUR, BLOODU, GLUCOSEU, AMORPHOUS in the last 72 hours.    Invalid input(s): KETONESU  Magnesium:   Lab Results   Component Value Date    MG 1.50 01/14/2014    MG 1.70 01/10/2014    MG 2.0 11/19/2011     ANA:    Lab Results   Component Value Date    ANATITER Negative at <1-80 10/17/2009    ANA POSITIVE at 1-40 10/17/2009       RADIOLOGY:    Xr Chest Standard Two Vw    07/27/2014   EXAMINATION: TWO VIEWS OF THE CHEST  07/27/2014 5:52 pm  COMPARISON: 07/25/2014  HISTORY: ORDERING SYSTEM PROVIDED HISTORY: follow up pneumonia TECHNOLOGIST PROVIDED HISTORY: Ordering Physician Provided Reason for Exam: follow up pneumonia Acuity: Acute Type of Exam: Initial  FINDINGS: Compression fracture of the lower thoracic spine is again seen.  Minimal right base atelectasis is noted.  There is scarring on the left.  Remote rib fractures are seen.  Cardiac silhouette within normal limits.  There is a hiatal hernia     07/27/2014   IMPRESSION: No acute disease     Xr Chest Standard Two Vw    07/25/2014   EXAMINATION: TWO VIEWS OF THE CHEST  07/25/2014 8:17 am  COMPARISON: Chest x-ray Jul 07, 2014  HISTORY: ORDERING SYSTEM PROVIDED HISTORY: generalized weakness, hypotension TECHNOLOGIST PROVIDED HISTORY: Ordering Physician Provided Reason for Exam: weakness Acuity: Unknown Type of Exam: Unknown  FINDINGS: There is new focal density of the right upper lobe above the hilum.  There also is new faint airspace disease of the lateral right lung base.  There is new focal opacity of the retrocardiac left lower lobe.   There is blunting of the left costophrenic angle consistent with pleural fluid.     07/25/2014   IMPRESSION: New bilateral multifocal airspace disease, most likely atelectasis or pneumonia although follow-up to ensure resolution is recommended.  New small to moderate size left pleural effusion.     Ct Head Wo Contrast    07/07/2014   EXAMINATION: CT OF THE HEAD WITHOUT CONTRAST  07/07/2014 11:11 pm  TECHNIQUE: CT of the head was performed without the administration of intravenous contrast.  COMPARISON: 04/01/2014  HISTORY: ORDERING SYSTEM PROVIDED HISTORY: dizzy TECHNOLOGIST PROVIDED HISTORY: Ordering Physician Provided Reason for Exam: dizzy  FINDINGS: BRAIN/VENTRICLES: There is no acute intracranial hemorrhage, mass effect or midline shift.  No abnormal extra-axial fluid collection.  The gray-white differentiation is maintained without evidence of an acute infarct.  There is no evidence of hydrocephalus.  ORBITS: The visualized portion of the orbits demonstrate no acute abnormality.  SINUSES: There is minimal mucosal thickening in the left maxillary sinus. The remaining visualized paranasal sinuses are clear.  The mastoid air cells are unremarkable.  SOFT TISSUES/SKULL:  No acute abnormality of the visualized skull or soft tissues.     07/07/2014   IMPRESSION: No acute intracranial abnormality.     Ct Chest W Contrast    07/26/2014   EXAMINATION: CT OF THE CHEST WITH CONTRAST 07/26/2014 2:14 pm  TECHNIQUE: CT of the chest was performed with the administration of intravenous contrast. Multiplanar reformatted images are provided for review. Dose modulation, iterative reconstruction, and/or weight based adjustment of the mA/kV was utilized to reduce the radiation dose to as low  as reasonably achievable.  COMPARISON: 06/27/2014  HISTORY: ORDERING SYSTEM PROVIDED HISTORY: SHORTNESS OF BREATH  FINDINGS: Mediastinum: Stable mediastinal lymph nodes, no convincing adenopathy. Thoracic aorta normal in caliber.  No pericardial  effusion.  .  Lungs/pleura: Interval decrease in right pleural effusion.  Stable trace left pleural effusion.  There are multifocal airspace opacities that are predominantly bandlike in morphology, mostly in the lower lobes, right middle lobe, and lingula.  There are a cluster of tiny nodules in the lateral right upper lobe.  There is a stable 1.3 cm nodule in the superior segment of the right lower lobe.  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  There is a tiny nonobstructing right renal stone.  Large venous collaterals are noted adjacent to the spleen  Soft Tissues/Bones: There is bilateral gynecomastia.  There are multiple thyroid nodules     07/26/2014   IMPRESSION: 1. Multifocal airspace opacities in the lower lobes, right middle lobe, and lingula have an appearance compatible with atelectasis, with or without pneumonia.  Cluster of nodular densities in the lateral right upper lobe most likely represents atypical infection.  1.3 cm nodule in the superior segment of the right lower lobe warrants six-month follow-up 2. Interval decrease in right pleural effusion 3. 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  LIVER: Evaluation of the liver is limited.  There is diffuse increased echogenicity with no focal lesion noted.  Somewhat nodular contour of the liver is compatible with known cirrhosis.  BILIARY SYSTEM: Limited imaging of the gallbladder is grossly unremarkable.  Common bile duct is within normal limits measuring 6 mm.  KIDNEYS: Right kidney is nonvisualized.  Left kidney measures 10.1 x 4.9 x 5.3 cm and  is grossly unremarkable.  PANCREAS: Pancreas is nonvisualized.  SPLEEN: Spleen measures 10.1 cm.  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  IVC: The IVC is patent.  AORTA: Aorta is obscured.  OTHER: No evidence of ascites.     07/08/2014   IMPRESSION: Limited study as above.  Cirrhosis and portal hypertension.  No evidence of ascites.     Ct Abdomen Pelvis W Iv Contrast Additional Contrast?: Oral    07/30/2014   EXAMINATION: CT OF THE ABDOMEN AND PELVIS WITH CONTRAST 07/30/2014 11:54 am  TECHNIQUE: CT of the abdomen and pelvis was performed with the administration of intravenous contrast. Multiplanar reformatted images are provided for review. Dose modulation, iterative reconstruction, and/or weight based adjustment of the mA/kV was utilized to reduce the radiation dose to as low as reasonably achievable.  COMPARISON: 09/07/2010  HISTORY: ORDERING SYSTEM PROVIDED HISTORY: ABD PAIN, FEVER, NO RECENT SURGERY TECHNOLOGIST PROVIDED HISTORY: Additional Contrast?->Oral Ordering Physician Provided Reason for Exam: ABD PAIN, FEVER, NO RECENT SURGERY Acuity: Unknown Type of Exam: Unknown  Unexplained anemia.  Evaluate for retroperitoneal hemorrhage.  FINDINGS: Lower Chest: There is mild bibasilar pleural and parenchymal disease, new from the prior study.  Organs: The liver is shrunken with a nodular contour consistent with cirrhosis.  No focal liver lesion.  The main portal vein is small in size and grossly patent.  There is a recannulized umbilical vein.  There are left upper quadrant varices and a  splenorenal shunt.  GI/Bowel: Bowel loops are unremarkable.  The gallbladder is moderately distended.  There are no calcified gallstones.  Pelvis: Urinary bladder, prostate gland and seminal vesicles are unremarkable.  Peritoneum/Retroperitoneum: No adenopathy, free air or free fluid.  Bones/Soft Tissues: There is bilateral flank edema.  There is a large left gluteus muscle mass/hematoma extending into the left upper thigh.     07/30/2014    IMPRESSION: Large left gluteus muscle mass/hematoma likely accounting for the unexplained anemia.  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     Xr Chest Portable    07/08/2014   EXAMINATION: SINGLE VIEW OF THE CHEST  07/07/2014 11:34 pm  COMPARISON: 06/03/2014  HISTORY: ORDERING SYSTEM PROVIDED HISTORY: other TECHNOLOGIST PROVIDED HISTORY: Ordering Physician Provided Reason for Exam: dizziness Acuity: Unknown Type of Exam: Unknown  FINDINGS: Cardiac leads project over the chest.  Diffuse interstitial opacity is demonstrated bilaterally.  Heterogeneous opacity is seen adjacent to the left heart border.  No evidence of pleural effusion.  Negative for pneumothorax. Cardiac and mediastinal silhouettes are similar to prior.  Old posterior left 7th rib fracture.     07/08/2014   IMPRESSION: Mild pulmonary edema.     Vl Extremity Venous Right    07/10/2014   Lower Extremities DVT Study   Demographics    Patient Name      Andrew [name]    Date of Study     07/09/2014          Gender              Male    Patient Number    [id number]          Date of Birth       March 26, 1940    Visit Number      [id number]         Age                 28 year(s)    Accession Number  [id number]           Room Number         6237    Corporate ID      [id number]            Sonographer         Erasmo Leventhal,                                                            RVS    Ordering          Deniece Portela,   Interpreting        [facility]  Physician         MD                  Physician           Peggyann Shoals,                                                            MD, [facility], [place]   Procedure  Type of Study:  Veins:Lower Extremities DVT Study, VL EXTREMITY VENOUS DUPLEX RIGHT.    Generic Orders:VL EXTREMITY VENOUS DUPLEX RIGHT.    Vascular Sonographer Report   Additional Indications:Swelling  Impressions Right Impression Acute totally occluding deep vein thrombosis involving the right proximal PTV and one gastroc vein  zone 5-6. Acute partially occluding deep vein thrombosis involving the popliteal vein and right one gastroc vein zone 5-6. No other evidence of deep vein or superficial vein thrombosis involving the right lower extremity and the left common femoral vein. Calf veins were not well visualized on the right due to patient positioning and edema.  Verbal to [employer].  Conclusions    Summary    Acute totally occluding deep vein thrombosis involving the right proximal  PTV and one gastroc vein zone 5-6.  Acute partially occluding deep vein thrombosis involving the popliteal vein  and right one gastroc vein zone 5-6.  Calf veins were not well visualized on the right due to patient positioning  and edema.    Signature    ------------------------------------------------------------------  Electronically signed by Peggyann Shoals, MD, [facility], RPVI  (Interpreting physician) on 07/10/2014 at 11:34 AM  ------------------------------------------------------------------   Patient Status:Routine. Advance - Vascular Lab. Technical Quality:Poor visualization.  Risk Factors History +------------------+----------+----------------------------------------------+ !Diagnosis         !Date      !Comments                                      ! +------------------+----------+----------------------------------------------+ !Previous Procedure!06/27/2014!Venous Doppler Bilateral: Acute DVT right     ! !                  !          !gastroc veins.                                ! +------------------+----------+----------------------------------------------+  Velocities are measured in cm/s ; Diameters are measured in mm  Right Lower Extremities DVT Study Measurements Right 2D Measurements +------------------------+----------+---------------+----------+ !Location                !Visualized!Compressibility!Thrombosis! +------------------------+----------+---------------+----------+ !Sapheno Femoral Junction!Yes        !Yes            !None      ! +------------------------+----------+---------------+----------+ !GSV Thigh               !Yes       !Yes            !None      ! +------------------------+----------+---------------+----------+ !Common Femoral          !Yes       !Yes            !None      ! +------------------------+----------+---------------+----------+ !Prox Femoral            !Yes       !Yes            !None      ! +------------------------+----------+---------------+----------+ !Mid Femoral             !Yes       !Yes            !None      ! +------------------------+----------+---------------+----------+ !Dist Femoral            !Yes       !  Yes            !None      ! +------------------------+----------+---------------+----------+ !Deep Femoral            !Yes       !Yes            !None      ! +------------------------+----------+---------------+----------+ !Popliteal               !Yes       !Partial        !Acute     ! +------------------------+----------+---------------+----------+ !GSV Below Knee          !Yes       !Yes            !None      ! +------------------------+----------+---------------+----------+ !Gastroc                 !Yes       !Partial        !Acute     ! +------------------------+----------+---------------+----------+ !PTV                     !Yes       !No             !Acute     ! +------------------------+----------+---------------+----------+ !Peroneal                !Yes       !Yes            !None      ! +------------------------+----------+---------------+----------+ !SSV                     !Yes       !Yes            !None      ! +------------------------+----------+---------------+----------+  Right Doppler Measurements +--------------+------+------+------------+ !Location      !Signal!Reflux!Reflux (sec)! +--------------+------+------+------------+ !Common Femoral!Phasic!      !            ! +--------------+------+------+------------+ !Femoral       !Phasic!      !            !  +--------------+------+------+------------+ !Deep Femoral  !Phasic!      !            ! +--------------+------+------+------------+ !Popliteal     !Phasic!      !            ! +--------------+------+------+------------+  Left Lower Extremities DVT Study Measurements Left 2D Measurements +------------------------+----------+---------------+----------+ !Location                !Visualized!Compressibility!Thrombosis! +------------------------+----------+---------------+----------+ !Sapheno Femoral Junction!Yes       !Yes            !None      ! +------------------------+----------+---------------+----------+ !Common Femoral          !Yes       !Yes            !None      ! +------------------------+----------+---------------+----------+  Left Doppler Measurements +--------------+------+------+------------+ !Location      !Signal!Reflux!Reflux (sec)! +--------------+------+------+------------+ !Common Femoral!Phasic!      !            ! +--------------+------+------+------------+    Vl Extremity Venous Bilateral    07/09/2014   Vascular Lower Extremities DVT Study Procedure  -- PRELIMINARY SONOGRAPHER REPORT --    Demographics    Patient Name      Andrew [name]  Date of Study     07/09/2014          Gender              Male    Patient Number    [id number]          Date of Birth       Apr 22, 1940    Visit Number      [id number]         Age                 47 year(s)    Accession Number  [id number]           Room Number         8527    Corporate ID      [id number]            Sonographer         Erasmo Leventhal,                                                            RVS    Ordering          Deniece Portela,   Interpreting        [facility]  Physician         MD                  Physician   Procedure  Type of Study:    Veins:Lower Extremities DVT Study, VL EXTREMITY VENOUS DUPLEX RIGHT.   Tech Comments Right Acute totally occluding deep vein thrombosis involving the right proximal PTV and one gastroc vein zone 5-6.  Acute partially occluding deep vein thrombosis involving the popliteal vein and right one gastroc vein zone 5-6. No other evidence of deep vein or superficial vein thrombosis involving the right lower extremity and the left common femoral vein. Calf veins were not well visualized on the right due to patient positioning and edema.  verbal to [employer].      Current Medications  ??? sodium chloride flush  10 mL Intravenous 2 times per day   ??? sodium chloride  250 mL Intravenous Once   ??? lactulose  20 g Oral TID   ??? amoxicillin-clavulanate  1 tablet Oral 2 times per day   ??? carvedilol  6.25 mg Oral BID WC   ??? vitamin D  1,000 Units Oral Daily   ??? citalopram  10 mg Oral Daily   ??? eplerenone  50 mg Oral Daily   ??? furosemide  40 mg Oral Daily   ??? lisinopril  5 mg Oral Nightly   ??? magnesium oxide  400 mg Oral Daily   ??? rifaximin  550 mg Oral BID   ??? sodium chloride flush  10 mL Intravenous 2 times per day        ASSESSMENT & PLAN:  Worsening anemia due to gluteal hematoma. Anticoagulation therapy has been stopped. He will require transfusion today. Will administer platelets since they are low at 42. Will check pt ptt (ptt was prolonged before but pt was adequate) overall prognosis appears to be poor.  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 Doubtful he could tolerate therapy even with azacytidine  Cirrhosis  Alpha one antitrypsin deficiency  I have discussed the above stated plan with the patient and they verbalized understanding and agreed with the plan. Thank you for allowing [place] to participate in this patients care.    Darlyn Read, MD, 08/01/2014, 7:03 AM

## 2014-08-01 NOTE — Progress Notes (Signed)
Assessment complete, see doc flowsheet. Patient resting in bed, call light in reach, bed in lowest position, brake set. Patient denies any needs at this time. Patient encouraged to call if needs arise.  Pat Arayna Illescas RN

## 2014-08-01 NOTE — Plan of Care (Signed)
Problem: Activity:  Goal: Ability to tolerate increased activity will improve  Ability to tolerate increased activity will improve   Outcome: Ongoing  Will encourage ambulation and up to chair as appropriate to increase activity

## 2014-08-01 NOTE — Progress Notes (Signed)
Platelets complete. No s/s reaction. VSS iv wnl. Will continue to monitor

## 2014-08-01 NOTE — Plan of Care (Signed)
Problem: SAFETY  Goal: Free from accidental physical injury  Outcome: Ongoing  Pt is a high fall risk. Pt remains free from falls, throughout night. Bed alarm remains in place, door open. Pt encouraged to use call light for needs throughout night; call light is within reach. Bed lock is in lowest position. Will continue to monitor throughout night.

## 2014-08-01 NOTE — Progress Notes (Signed)
Physical Therapy    Attempted to see pt this AM for PT tx session. Spoke with RN, Sharyn Lull, prior to approach. Sharyn Lull, RN requesting to hold PT secondary to pt receiving blood products this date and is not medically appropriate at this time. Will follow up per PT POC as medically appropriate.     Thank you,   Genene Churn PT, DPT [phone]

## 2014-08-01 NOTE — Progress Notes (Signed)
1st unit PRBC complete. VSS

## 2014-08-01 NOTE — Progress Notes (Signed)
Progress Note - Dr. Tasia Catchings - Internal Medicine  PCP: Alecia Lemming, DO [place] / [place] [address] [phone]    Hospital Day: 7  Code Status: Full Code  Current Diet: DIET GENERAL; No Added Salt (3-4 GM)        CC: follow up on medical issues    Subjective:   Andrew [name] is a 74 y.o. male.    He denies problems    Pt is still lethargic  Counts lower despite transfusion      He does not cooperate with ROS  I have reviewed the patient's medical and social history in detail and updated the computerized patient record.  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 He  has past surgical history that includes AV fistula repair (1970's); Colonoscopy (4/12); Cardiac catheterization; eye surgery (04/12/2014); Cosmetic surgery (04/2013); bone marrow biopsy; and knee surgery.[name] He  reports that he has never smoked. He has never used smokeless tobacco. He reports that he does not drink alcohol or use illicit drugs..        Active Hospital Problems    Diagnosis Date Noted   ??? Hematoma [T14.8] 07/31/2014   ??? Pneumonia of both lungs due to infectious organism [J18.9]    ??? Traumatic hematoma of buttock [S30.0XXA]    ??? Pneumonia [J18.9] 07/25/2014   ??? 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 DVT (deep venous thrombosis) ([place]) [I82.409] 07/10/2014   ??? Hepatic encephalopathy (HCC) [K72.90] 07/07/2014   ??? Benign essential HTN [I10] 07/03/2014       Current facility-administered medications: 0.9 % sodium chloride bolus, 250 mL,  Intravenous, Once PRN  0.9 % sodium chloride bolus, 250 mL, Intravenous, Once  0.9 % sodium chloride bolus, 250 mL, Intravenous, Once  sodium chloride flush 0.9 % injection 10 mL, 10 mL, Intravenous, 2 times per day  sodium chloride flush 0.9 % injection 10 mL, 10 mL, Intravenous, PRN  acetaminophen (TYLENOL) tablet 650 mg, 650 mg, Oral, Q4H PRN  magnesium hydroxide (MILK OF MAGNESIA) 400 MG/5ML suspension 30 mL, 30 mL, Oral, Daily PRN  ondansetron (ZOFRAN) injection 4 mg, 4 mg, Intravenous, Q6H PRN  0.9 % sodium chloride bolus, 250 mL, Intravenous, Once  lactulose (CHRONULAC) 10 GM/15ML solution 20 g, 20 g, Oral, TID  amoxicillin-clavulanate (AUGMENTIN) 875-125 MG per tablet 1 tablet, 1 tablet, Oral, 2 times per day  ioversol (OPTIRAY) 68 % injection 100 mL, 100 mL, Intravenous, ONCE PRN  sodium chloride (PF) 0.9 % injection 10 mL, 10 mL, Intravenous, PRN  carvedilol (COREG) tablet 6.25 mg, 6.25 mg, Oral, BID WC  vitamin D (CHOLECALCIFEROL) tablet 1,000 Units, 1,000 Units, Oral, Daily  citalopram (CELEXA) tablet 10 mg, 10 mg, Oral, Daily  eplerenone (INSPRA) tablet 50 mg, 50 mg, Oral, Daily  furosemide (LASIX) tablet 40 mg, 40 mg, Oral, Daily  lisinopril (PRINIVIL;ZESTRIL) tablet 5 mg, 5 mg, Oral, Nightly  magnesium oxide (MAG-OX) tablet 400 mg, 400 mg, Oral, Daily  rifaximin (XIFAXAN) tablet 550 mg, 550 mg, Oral, BID  traMADol (ULTRAM) tablet 50 mg, 50 mg, Oral, Q12H PRN  0.9 % sodium chloride infusion, , Intravenous, Continuous  sodium chloride flush 0.9 %  injection 10 mL, 10 mL, Intravenous, 2 times per day  sodium chloride flush 0.9 % injection 10 mL, 10 mL, Intravenous, PRN  acetaminophen (TYLENOL) tablet 650 mg, 650 mg, Oral, Q4H PRN  HYDROcodone-acetaminophen (NORCO) 5-325 MG per tablet 1 tablet, 1 tablet, Oral, Q4H PRN **OR** HYDROcodone-acetaminophen (NORCO) 5-325 MG per tablet 2 tablet, 2 tablet, Oral, Q4H PRN  morphine (PF) injection 2 mg, 2 mg, Intravenous, Q2H PRN **OR** morphine (PF) injection 4 mg, 4  mg, Intravenous, Q2H PRN  magnesium hydroxide (MILK OF MAGNESIA) 400 MG/5ML suspension 30 mL, 30 mL, Oral, Daily PRN  ondansetron (ZOFRAN) injection 4 mg, 4 mg, Intravenous, Q6H PRN  potassium chloride SA (K-DUR;KLOR-CON M) tablet 40 mEq, 40 mEq, Oral, PRN **OR** potassium chloride 20 MEQ/15ML (10%) oral solution 40 mEq, 40 mEq, Oral, PRN **OR** potassium chloride 10 mEq/100 mL IVPB (Peripheral Line), 10 mEq, Intravenous, PRN         Objective:  BP 97/62 mmHg   Pulse 79   Temp(Src) 97.6 ??F (36.4 ??C) (Temporal)   Resp 16   Ht 5\' 5"  (1.651 m)   Wt 190 lb 9.6 oz (86.456 kg)   BMI 31.72 kg/m2   SpO2 92%     Patient Vitals for the past 24 hrs:   BP Temp Temp src Pulse Resp SpO2 Height Weight   08/01/14 1026 97/62 mmHg 97.6 ??F (36.4 ??C) - 79 16 - - -   08/01/14 1015 97/64 mmHg 97.8 ??F (36.6 ??C) - 75 16 - - -   08/01/14 1000 103/67 mmHg 97.8 ??F (36.6 ??C) - 81 16 - - -   08/01/14 0751 94/58 mmHg 98 ??F (36.7 ??C) Temporal 76 16 92 % - -   08/01/14 0700 - - - - - - - 190 lb 9.6 oz (86.456 kg)   08/01/14 0530 111/68 mmHg 98.3 ??F (36.8 ??C) Temporal 77 16 95 % - -   07/31/14 2110 115/70 mmHg 98.4 ??F (36.9 ??C) Temporal 76 16 - - -   07/31/14 1515 105/68 mmHg 97.8 ??F (36.6 ??C) - 79 16 95 % - -   07/31/14 1100 111/71 mmHg 97.9 ??F (36.6 ??C) - 89 16 - - -     Patient Vitals for the past 96 hrs (Last 3 readings):   Weight   08/01/14 0700 190 lb 9.6 oz (86.456 kg)   07/31/14 0657 189 lb 11.2 oz (86.047 kg)   07/30/14 0715 186 lb 14.4 oz (84.777 kg)         No intake or output data in the 24 hours ending 08/01/14 1028      Physical Exam:   S1, S2 normal, no murmur, rub or gallop, regular rate and rhythm  clear to auscultation bilaterally  abdomen is soft without significant tenderness, masses, organomegaly or guarding  extremities normal, atraumatic, no cyanosis or edema    Labs:  Lab Results   Component Value Date    WBC 2.4* 08/01/2014    HGB 6.9* 08/01/2014    HCT 20.9* 08/01/2014    PLT 42* 08/01/2014    CHOL 101 04/02/2014    TRIG  83 04/02/2014    HDL 22* 04/02/2014    ALT 15 07/31/2014    AST 32 07/31/2014    NA 136 08/01/2014    K 5.4* 08/01/2014    CL 109 08/01/2014    CREATININE 0.8 08/01/2014    BUN 29* 08/01/2014    CO2 23 08/01/2014    TSH 2.15 08/15/2013  PSA 0.55 08/15/2013    INR 1.33* 08/01/2014    LABA1C 5.0 01/10/2014    LABMICR YES 07/25/2014     Lab Results   Component Value Date    TROPONINI <0.01 07/25/2014       Recent Imaging Results are Reviewed:  Xr Chest Standard Two Vw    07/27/2014   EXAMINATION: TWO VIEWS OF THE CHEST  07/27/2014 5:52 pm  COMPARISON: 07/25/2014  HISTORY: ORDERING SYSTEM PROVIDED HISTORY: follow up pneumonia TECHNOLOGIST PROVIDED HISTORY: Ordering Physician Provided Reason for Exam: follow up pneumonia Acuity: Acute Type of Exam: Initial  FINDINGS: Compression fracture of the lower thoracic spine is again seen.  Minimal right base atelectasis is noted.  There is scarring on the left.  Remote rib fractures are seen.  Cardiac silhouette within normal limits.  There is a hiatal hernia     07/27/2014   IMPRESSION: No acute disease     Xr Chest Standard Two Vw    07/25/2014   EXAMINATION: TWO VIEWS OF THE CHEST  07/25/2014 8:17 am  COMPARISON: Chest x-ray Jul 07, 2014  HISTORY: ORDERING SYSTEM PROVIDED HISTORY: generalized weakness, hypotension TECHNOLOGIST PROVIDED HISTORY: Ordering Physician Provided Reason for Exam: weakness Acuity: Unknown Type of Exam: Unknown  FINDINGS: There is new focal density of the right upper lobe above the hilum.  There also is new faint airspace disease of the lateral right lung base.  There is new focal opacity of the retrocardiac left lower lobe.  There is blunting of the left costophrenic angle consistent with pleural fluid.     07/25/2014   IMPRESSION: New bilateral multifocal airspace disease, most likely atelectasis or pneumonia although follow-up to ensure resolution is recommended.  New small to moderate size left pleural effusion.     Ct Head Wo Contrast    07/07/2014    EXAMINATION: CT OF THE HEAD WITHOUT CONTRAST  07/07/2014 11:11 pm  TECHNIQUE: CT of the head was performed without the administration of intravenous contrast.  COMPARISON: 04/01/2014  HISTORY: ORDERING SYSTEM PROVIDED HISTORY: dizzy TECHNOLOGIST PROVIDED HISTORY: Ordering Physician Provided Reason for Exam: dizzy  FINDINGS: BRAIN/VENTRICLES: There is no acute intracranial hemorrhage, mass effect or midline shift.  No abnormal extra-axial fluid collection.  The gray-white differentiation is maintained without evidence of an acute infarct.  There is no evidence of hydrocephalus.  ORBITS: The visualized portion of the orbits demonstrate no acute abnormality.  SINUSES: There is minimal mucosal thickening in the left maxillary sinus. The remaining visualized paranasal sinuses are clear.  The mastoid air cells are unremarkable.  SOFT TISSUES/SKULL:  No acute abnormality of the visualized skull or soft tissues.     07/07/2014   IMPRESSION: No acute intracranial abnormality.     Ct Chest W Contrast    07/26/2014   EXAMINATION: CT OF THE CHEST WITH CONTRAST 07/26/2014 2:14 pm  TECHNIQUE: CT of the chest was performed with the administration of intravenous contrast. Multiplanar reformatted images are provided for review. Dose modulation, iterative reconstruction, and/or weight based adjustment of the mA/kV was utilized to reduce the radiation dose to as low as reasonably achievable.  COMPARISON: 06/27/2014  HISTORY: ORDERING SYSTEM PROVIDED HISTORY: SHORTNESS OF BREATH  FINDINGS: Mediastinum: Stable mediastinal lymph nodes, no convincing adenopathy. Thoracic aorta normal in caliber.  No pericardial effusion.  .  Lungs/pleura: Interval decrease in right pleural effusion.  Stable trace left pleural effusion.  There are multifocal airspace opacities that are predominantly bandlike in morphology, mostly in the lower lobes, right middle lobe,  and lingula.  There are a cluster of tiny nodules in the lateral right upper lobe.  There is a  stable 1.3 cm nodule in the superior segment of the right lower lobe.  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  There is a tiny nonobstructing right renal stone.  Large venous collaterals are noted adjacent to the spleen  Soft Tissues/Bones: There is bilateral gynecomastia.  There are multiple thyroid nodules     07/26/2014   IMPRESSION: 1. Multifocal airspace opacities in the lower lobes, right middle lobe, and lingula have an appearance compatible with atelectasis, with or without pneumonia.  Cluster of nodular densities in the lateral right upper lobe most likely represents atypical infection.  1.3 cm nodule in the superior segment of the right lower lobe warrants six-month follow-up 2. Interval decrease in right pleural effusion 3. 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  LIVER: Evaluation of the liver is limited.  There is diffuse increased echogenicity with no focal lesion noted.  Somewhat nodular contour of the liver is compatible with known cirrhosis.  BILIARY SYSTEM: Limited imaging of the gallbladder is grossly unremarkable.  Common bile duct is within normal limits measuring 6 mm.  KIDNEYS: Right kidney is nonvisualized.  Left kidney measures 10.1 x 4.9 x 5.3 cm and is grossly unremarkable.  PANCREAS: Pancreas is nonvisualized.  SPLEEN: Spleen measures 10.1 cm.  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  IVC: The IVC is patent.  AORTA: Aorta is obscured.  OTHER: No evidence of ascites.     07/08/2014   IMPRESSION: Limited study as above.  Cirrhosis and portal hypertension.  No evidence of ascites.     Ct Abdomen  Pelvis W Iv Contrast Additional Contrast?: Oral    07/30/2014   EXAMINATION: CT OF THE ABDOMEN AND PELVIS WITH CONTRAST 07/30/2014 11:54 am  TECHNIQUE: CT of the abdomen and pelvis was performed with the administration of intravenous contrast. Multiplanar reformatted images are provided for review. Dose modulation, iterative reconstruction, and/or weight based adjustment of the mA/kV was utilized to reduce the radiation dose to as low as reasonably achievable.  COMPARISON: 09/07/2010  HISTORY: ORDERING SYSTEM PROVIDED HISTORY: ABD PAIN, FEVER, NO RECENT SURGERY TECHNOLOGIST PROVIDED HISTORY: Additional Contrast?->Oral Ordering Physician Provided Reason for Exam: ABD PAIN, FEVER, NO RECENT SURGERY Acuity: Unknown Type of Exam: Unknown  Unexplained anemia.  Evaluate for retroperitoneal hemorrhage.  FINDINGS: Lower Chest: There is mild bibasilar pleural and parenchymal disease, new from the prior study.  Organs: The liver is shrunken with a nodular contour consistent with cirrhosis.  No focal liver lesion.  The main portal vein is small in size and grossly patent.  There is a recannulized umbilical vein.  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  GI/Bowel: Bowel loops are unremarkable.  The gallbladder is moderately distended.  There are no calcified gallstones.  Pelvis: Urinary bladder, prostate gland and seminal vesicles are unremarkable.  Peritoneum/Retroperitoneum: No adenopathy, free air or free fluid.  Bones/Soft Tissues: There is bilateral flank edema.  There is a large left gluteus muscle mass/hematoma extending into the left upper thigh.     07/30/2014  IMPRESSION: Large left gluteus muscle mass/hematoma likely accounting for the unexplained anemia.  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     Xr Chest Portable    07/08/2014   EXAMINATION: SINGLE VIEW OF THE CHEST  07/07/2014 11:34 pm  COMPARISON: 06/03/2014  HISTORY: ORDERING SYSTEM PROVIDED HISTORY: other TECHNOLOGIST  PROVIDED HISTORY: Ordering Physician Provided Reason for Exam: dizziness Acuity: Unknown Type of Exam: Unknown  FINDINGS: Cardiac leads project over the chest.  Diffuse interstitial opacity is demonstrated bilaterally.  Heterogeneous opacity is seen adjacent to the left heart border.  No evidence of pleural effusion.  Negative for pneumothorax. Cardiac and mediastinal silhouettes are similar to prior.  Old posterior left 7th rib fracture.     07/08/2014   IMPRESSION: Mild pulmonary edema.     Vl Extremity Venous Right    07/10/2014   Lower Extremities DVT Study   Demographics    Patient Name      ZYION DOXTATER    Date of Study     07/09/2014          Gender              Male    Patient Number    [id number]          Date of Birth       05/20/1940    Visit Number      [id number]         Age                 24 year(s)    Accession Number  [id number]           Room Number         2542    Corporate ID      [id number]            Sonographer         Erasmo Leventhal,                                                            RVS    Ordering          Deniece Portela,   Interpreting        [facility]  Physician         MD                  Physician           Peggyann Shoals,                                                            MD, [facility], [place]   Procedure  Type of Study:    Veins:Lower Extremities DVT Study, VL EXTREMITY VENOUS DUPLEX RIGHT.    Generic Orders:VL EXTREMITY VENOUS DUPLEX RIGHT.    Vascular Sonographer Report   Additional Indications:Swelling  Impressions Right Impression Acute totally occluding deep vein thrombosis involving the right proximal PTV and one gastroc vein zone 5-6. Acute partially occluding deep vein thrombosis involving the popliteal vein and right one gastroc vein zone 5-6. No other evidence of  deep vein or superficial vein thrombosis involving the right lower extremity and the left common femoral vein. Calf veins were not well visualized on the right due to patient positioning and edema.   Verbal to [employer].  Conclusions    Summary    Acute totally occluding deep vein thrombosis involving the right proximal  PTV and one gastroc vein zone 5-6.  Acute partially occluding deep vein thrombosis involving the popliteal vein  and right one gastroc vein zone 5-6.  Calf veins were not well visualized on the right due to patient positioning  and edema.    Signature    ------------------------------------------------------------------  Electronically signed by Moses Manners, MD, [facility], RPVI  (Interpreting physician) on 07/10/2014 at 11:34 AM  ------------------------------------------------------------------   Patient Status:Routine. Study Location:[facility] - Vascular Lab. Technical Quality:Poor visualization.  Risk Factors History +------------------+----------+----------------------------------------------+ !Diagnosis         !Date      !Comments                                      ! +------------------+----------+----------------------------------------------+ !Previous Procedure!06/27/2014!Venous Doppler Bilateral: Acute DVT right     ! !                  !          !gastroc veins.                                ! +------------------+----------+----------------------------------------------+  Velocities are measured in cm/s ; Diameters are measured in mm  Right Lower Extremities DVT Study Measurements Right 2D Measurements +------------------------+----------+---------------+----------+ !Location                !Visualized!Compressibility!Thrombosis! +------------------------+----------+---------------+----------+ !Sapheno Femoral Junction!Yes       !Yes            !None      ! +------------------------+----------+---------------+----------+ !GSV Thigh               !Yes       !Yes            !None      ! +------------------------+----------+---------------+----------+ !Common Femoral          !Yes       !Yes            !None      !  +------------------------+----------+---------------+----------+ !Prox Femoral            !Yes       !Yes            !None      ! +------------------------+----------+---------------+----------+ !Mid Femoral             !Yes       !Yes            !None      ! +------------------------+----------+---------------+----------+ !Dist Femoral            !Yes       !Yes            !None      ! +------------------------+----------+---------------+----------+ !Deep Femoral            !Yes       !Yes            !None      ! +------------------------+----------+---------------+----------+ !Popliteal               !  Yes       !Partial        !Acute     ! +------------------------+----------+---------------+----------+ !GSV Below Knee          !Yes       !Yes            !None      ! +------------------------+----------+---------------+----------+ !Gastroc                 !Yes       !Partial        !Acute     ! +------------------------+----------+---------------+----------+ !PTV                     !Yes       !No             !Acute     ! +------------------------+----------+---------------+----------+ !Peroneal                !Yes       !Yes            !None      ! +------------------------+----------+---------------+----------+ !SSV                     !Yes       !Yes            !None      ! +------------------------+----------+---------------+----------+  Right Doppler Measurements +--------------+------+------+------------+ !Location      !Signal!Reflux!Reflux (sec)! +--------------+------+------+------------+ !Common Femoral!Phasic!      !            ! +--------------+------+------+------------+ !Femoral       !Phasic!      !            ! +--------------+------+------+------------+ !Deep Femoral  !Phasic!      !            ! +--------------+------+------+------------+ !Popliteal     !Phasic!      !            ! +--------------+------+------+------------+  Left Lower Extremities DVT Study Measurements Left 2D Measurements  +------------------------+----------+---------------+----------+ !Location                !Visualized!Compressibility!Thrombosis! +------------------------+----------+---------------+----------+ !Sapheno Femoral Junction!Yes       !Yes            !None      ! +------------------------+----------+---------------+----------+ !Common Femoral          !Yes       !Yes            !None      ! +------------------------+----------+---------------+----------+  Left Doppler Measurements +--------------+------+------+------------+ !Location      !Signal!Reflux!Reflux (sec)! +--------------+------+------+------------+ !Common Femoral!Phasic!      !            ! +--------------+------+------+------------+    Vl Extremity Venous Bilateral    07/09/2014   Vascular Lower Extremities DVT Study Procedure  -- PRELIMINARY SONOGRAPHER REPORT --    Demographics    Patient Name      JOHNDAVID GERALDS    Date of Study     07/09/2014          Gender              Male    Patient Number    [id number]          Date of Birth       01/26/1941    Visit Number      [id number]  Age                 5 year(s)    Accession Number  [id number]           Room Number         3810    Corporate ID      [id number]            Sonographer         Erasmo Leventhal,                                                            RVS    Ordering          Deniece Portela,   Interpreting        [facility]  Physician         MD                  Physician   Procedure  Type of Study:    Veins:Lower Extremities DVT Study, VL EXTREMITY VENOUS DUPLEX RIGHT.   Tech Comments Right Acute totally occluding deep vein thrombosis involving the right proximal PTV and one gastroc vein zone 5-6. Acute partially occluding deep vein thrombosis involving the popliteal vein and right one gastroc vein zone 5-6. No other evidence of deep vein or superficial vein thrombosis involving the right lower extremity and the left common femoral vein. Calf veins were not well visualized on the  right due to patient positioning and edema.  verbal to [employer].      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

## 2014-08-01 NOTE — Progress Notes (Signed)
Occupational Therapy    Attempted to see Pt Andrew [name] this date 08/01/14.  Nursing informed therapist that the Pt was on a medical hold through the end of the day 08/01/14 and is unable to participate in skilled OT.    Thanks!  Illene Labrador COTA/L [phone]

## 2014-08-01 NOTE — Progress Notes (Signed)
Nutrition Assessment    Type and Reason for Visit: Initial, LOS    Nutrition Recommendations:   No new recommendations at this time.    Malnutrition Assessment:  ?? Malnutrition Status: No malnutrition  ?? Context:    ?? Findings of the 6 clinical characteristics of malnutrition (Minimum of 2 out of 6 clinical characteristics is required to make the diagnosis of moderate or severe Protein Calorie Malnutrition based on AND/ASPEN Guidelines):  1. Energy Intake-51% to 75%, greater than 7 days    2. Weight Loss-No significant weight loss,    3. Fat Loss-Unable to assess,    4. Muscle Loss-Unable to assess,    5. Fluid Accumulation-No significant fluid accumulation,    6. Grip Strength-Not measured    Nutrition Diagnosis:   ?? Problem: No nutrition diagnosis at this time  ?? Etiology: related to      ??? Signs and symptoms:  as evidenced by      Nutrition Assessment:  ?? Subjective Assessment: RD triggered d/t LOS. Pt sleeping upon RD visit. Spoke with pt's wife, who reports pt has a fair appetite. Does not take an ONS at home. Per wife, pt will be d/c to rehab facility. Weight fluctuations noted; most likely r/t fluids.  ?? Nutrition-Focused Physical Findings:    ?? Wound Type:    ?? Current Nutrition Therapies:  ?? Oral Diet Orders: General, No Added Salt (3-4gm)   ?? Oral Diet intake: 51-75%  ?? Anthropometric Measures:  ?? Ht: 5\' 5"  (165.1 cm)   ?? Current Body Wt: 190 lb (86.183 kg)  ?? Admission Body Wt: 171 lb (77.565 kg)  ?? Usual Body Wt: 188 lb (85.276 kg)  ?? Ideal Body Wt: 136 lb (61.689 kg)   ?? BMI Classification: BMI 30.0 - 34.9 Obese Class I    Estimated Intake vs Estimated Needs: Intake Meets Needs    Nutrition Risk Level: Moderate    Nutrition Interventions:   Continue current diet  Continued Inpatient Monitoring    Nutrition Evaluation:   ?? Evaluation: Goals set   ?? Goals: Pt will consume >50% of all meal trays.    ?? Monitoring: Meals Intake, Weight Status, Patient/Family Education    See Adult Nutrition Doc Flowsheet  for more detail.     Electronically signed by Nikki Dom. Kary Colaizzi, [place], LD on 08/01/14 at 1:26 PM    Contact Number: [phone]

## 2014-08-01 NOTE — Progress Notes (Signed)
Began infusing 2nd unit PRBC. VSS, no s/s distress.

## 2014-08-01 NOTE — Other (Signed)
Post procedure site check completed. Surgical site is within normal limits and appears to be free of complications. All concerns were reviewed and questions answered. Patient verbalized understanding.

## 2014-08-01 NOTE — Plan of Care (Signed)
Problem: SAFETY  Goal: Free from accidental physical injury  Patient free from harm. ID bands on, bed in lowest position, call light in reach. Patient instructed to call for help if needed. Patient educated on ambulation and safety.        Problem: Risk for Impaired Skin Integrity  Goal: Tissue integrity - skin and mucous membranes  Structural intactness and normal physiological function of skin and  mucous membranes.   Pt skin cleaned and dried as needed to ensure skin integrity.  Darla Lesches RN

## 2014-08-01 NOTE — Progress Notes (Signed)
[facility]        PATIENT NAME: Andrew [name]     TODAY'S DATE: 08/01/2014    SUbJECTIVE:    Pt laying in bed with wife at his side. More awake and alert today. States he's feeling better.Denies pain.     OBJECTIVE:  VITALS:  BP 94/58 mmHg   Pulse 76   Temp(Src) 98 ??F (36.7 ??C) (Temporal)   Resp 16   Ht 5\' 5"  (1.651 m)   Wt 189 lb 11.2 oz (86.047 kg)   BMI 31.57 kg/m2   SpO2 92%    INTAKE/OUTPUT:            CONSTITUTIONAL:  awake and alert  LUNGS:  Non increased WOB  ABDOMEN:  soft, non-distended, non-tender   EXT: right groin site from IVC placement looks good.  Left lateral leg with dark red/blue hematoma distal to posterior patellar area to posterior ankle. Edema 2+ in left leg. Area tender in upper leg. +2 PD pulses bilaterally     Data:  CBC:   Recent Labs      07/31/14   0002  07/31/14   0617   07/31/14   1809  08/01/14   0014  08/01/14   0551   WBC  3.3*  2.9*   --    --    --   2.4*   HGB  8.4*  7.5*   < >  7.4*  7.7*  6.9*   HCT  26.1*  22.9*   < >  22.8*  23.2*  20.9*   PLT  48*  43*   --    --    --   42*    < > = values in this interval not displayed.     BMP:    Recent Labs      07/30/14   0635  07/31/14   0617  08/01/14   0551   NA  136  132*  136   K  5.0  4.8  5.4*   CL  109  106  109   CO2  22  20*  23   BUN  23*  28*  29*   CREATININE  0.9  0.8  0.8   GLUCOSE  121*  114*  122*     Hepatic:   Recent Labs      07/31/14   0617   AST  32   ALT  15   BILITOT  1.6*   ALKPHOS  64     Mag:    No results for input(s): MG in the last 72 hours.   Phos:   No results for input(s): PHOS in the last 72 hours.   INR: No results for input(s): INR in the last 72 hours.    ASSESSMENT/PLAN:      74 y.o. male admitted with   1. Pneumonia of both lungs due to infectious organism, unspecified part of lung    2. Generalized weakness    3. Hypotension, unspecified hypotension type    4.  left gluteus muscle mass/hematoma  5.     Afebrile, HGB dropped 6.9, ecchymosis down lateral aspect  of left leg.     PRBCs and platelets per hemoc today.  No s/s of infection or compression - no plan for surgical intervention   Will follow       Discussed with patient and nursing.  Discussed with Dr. Doroteo Glassman      ?? DVT prophylaxis:  SCD's  ?? The patient is not currently smoking. The risks related to smoking were reviewed with the patient. Recommend maintaining a smoke-free lifestyle. Products available for smoking cessation were discussed in detail.    Delorse Limber, CNP    Surg Staff:   Pt seen and reviewed with NP  Has had ooze / bleed and ecchymosis  Filter in place  Transfuse and follow    Resa Miner Doroteo Glassman

## 2014-08-01 NOTE — Progress Notes (Signed)
Began infusing 1st unit PRBC. VSS, no S/s reaction. IV wnl.

## 2014-08-02 LAB — CBC WITH AUTO DIFFERENTIAL
Basophils %: 1 %
Basophils Absolute: 0 10*3/uL (ref 0.0–0.2)
Eosinophils %: 1.1 %
Eosinophils Absolute: 0 10*3/uL (ref 0.0–0.6)
Hematocrit: 25.4 % — ABNORMAL LOW (ref 40.5–52.5)
Hemoglobin: 8.4 g/dL — ABNORMAL LOW (ref 13.5–17.5)
Lymphocytes %: 42.2 %
Lymphocytes Absolute: 1 10*3/uL (ref 1.0–5.1)
MCH: 32.5 pg (ref 26.0–34.0)
MCHC: 32.9 g/dL (ref 31.0–36.0)
MCV: 98.7 fL (ref 80.0–100.0)
MPV: 8.9 fL (ref 5.0–10.5)
Monocytes %: 16.3 %
Monocytes Absolute: 0.4 10*3/uL (ref 0.0–1.3)
Neutrophils %: 39.4 %
Neutrophils Absolute: 0.9 10*3/uL — CL (ref 1.7–7.7)
PLATELET SLIDE REVIEW: DECREASED
Platelets: 57 10*3/uL — ABNORMAL LOW (ref 135–450)
RBC: 2.57 M/uL — ABNORMAL LOW (ref 4.20–5.90)
RDW: 15.5 % — ABNORMAL HIGH (ref 12.4–15.4)
WBC: 2.3 10*3/uL — ABNORMAL LOW (ref 4.0–11.0)

## 2014-08-02 LAB — BASIC METABOLIC PANEL
Anion Gap: 6 (ref 3–16)
BUN: 23 mg/dL — ABNORMAL HIGH (ref 7–20)
CO2: 22 mmol/L (ref 21–32)
Calcium: 7.9 mg/dL — ABNORMAL LOW (ref 8.3–10.6)
Chloride: 108 mmol/L (ref 99–110)
Creatinine: 0.7 mg/dL — ABNORMAL LOW (ref 0.8–1.3)
GFR African American: >60 (ref >60)
GFR Non-African American: >60 (ref >60)
Glucose: 107 mg/dL — ABNORMAL HIGH (ref 70–99)
Potassium: 4.9 mmol/L (ref 3.5–5.1)
Sodium: 136 mmol/L (ref 136–145)

## 2014-08-02 MED FILL — VITAMIN D3 25 MCG (1000 UT) PO TABS: 25 MCG (1000 UT) | ORAL | Qty: 1

## 2014-08-02 MED FILL — CARVEDILOL 6.25 MG PO TABS: 6.25 MG | ORAL | Qty: 1

## 2014-08-02 MED FILL — XIFAXAN 550 MG PO TABS: 550 MG | ORAL | Qty: 1

## 2014-08-02 MED FILL — MAGNESIUM OXIDE 400 (241.3 MG) MG PO TABS: 400 (241.3 Mg) MG | ORAL | Qty: 1

## 2014-08-02 MED FILL — NORMAL SALINE FLUSH 0.9 % IV SOLN: 0.9 % | INTRAVENOUS | Qty: 10

## 2014-08-02 MED FILL — FUROSEMIDE 40 MG PO TABS: 40 MG | ORAL | Qty: 1

## 2014-08-02 MED FILL — EPLERENONE 25 MG PO TABS: 25 MG | ORAL | Qty: 2

## 2014-08-02 MED FILL — SODIUM CHLORIDE 0.9 % IV SOLN: 0.9 % | INTRAVENOUS | Qty: 1000

## 2014-08-02 MED FILL — AMOXICILLIN-POT CLAVULANATE 875-125 MG PO TABS: 875-125 MG | ORAL | Qty: 1

## 2014-08-02 MED FILL — CITALOPRAM HYDROBROMIDE 20 MG PO TABS: 20 MG | ORAL | Qty: 1

## 2014-08-02 NOTE — Progress Notes (Signed)
[facility] FOLLOW-UP:     Primary Oncologist: Dr. Eulis Canner  Date: 08/02/2014    PCP: Alecia Lemming, DO  Referring Provider: No ref. provider found    PROBLEM LIST:     Patient Active Problem List   Diagnosis   ??? Erectile dysfunction   ??? Thrombocytopenia   ??? Edema   ??? BPH (benign prostatic hyperplasia)   ??? Alpha-1-antitrypsin deficiency   ??? Cirrhosis, nonalcoholic ([place])   ??? Mild CAD   ??? Chronic systolic CHF (congestive heart failure) ([place])   ??? Non-ischemic cardiomyopathy ([place])   ??? Lumbar compression fracture ([place])   ??? Chronic low back pain   ??? Osteopenia   ??? Coagulopathy ([place])   ??? Abnormal EKG   ??? Visual changes   ??? Fixed pupil of right eye   ??? Anemia   ??? Hemispheric carotid artery syndrome   ??? CAD in native artery   ??? Hyperlipidemia   ??? 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 Bronchiectasis without complication ([place])   ??? Benign essential HTN   ??? Hepatic encephalopathy ([facility])   ??? Ataxia   ??? Pancytopenia ([place])   ??? DVT (deep venous thrombosis) ([place])   ??? 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 Pneumonia   ??? Traumatic hematoma of buttock   ??? Pneumonia of both lungs due to infectious organism   ??? Hematoma       Specialty Problems        Oncology Problems    Myelodysplasia (myelodysplastic syndrome) ([facility])              INTERVAL HISTORY     Feeling better.  Denies pain.    REVIEW OF SYSTEMS:     Weak.   Denies chest pain or shortness of breath.     PHYSICAL EXAM:     BP 109/69 mmHg   Pulse 81   Temp(Src) 97 ??F (36.1 ??C) (Temporal)   Resp 16   Ht 5\' 5"  (1.651 m)   Wt 192 lb 6.4 oz (87.272 kg)   BMI 32.02 kg/m2   SpO2 95%    WEIGHT:   Wt Readings from Last 3 Encounters:   08/02/14 192 lb 6.4 oz (87.272 kg)   07/24/14 171 lb (77.565 kg)   07/19/14 173 lb 6.4 oz (78.654 kg)     GENERAL APPEARANCE: alert and cooperative, ill-appearing.    HEAD: Normocephalic, without obvious abnormality, atraumatic  NECK: No palpable lymphadenopathy in supraclavicular or cervical chains  LUNGS: Clear to auscultation bilaterally, no audible  rales, wheezes or crackles  HEART: Regular rate and rhythm, S1, S2 normal  ABDOMEN: Soft, somewhat distended.  EXTREMITIES: without cyanosis, clubbing, or asymmetry. Leg edema.  Hematoma of buttock.  SKIN: No jaundice, purpura or petechiae    LABS:     CBC:  Lab Results   Component Value Date    WBC 2.3* 08/02/2014    HGB 8.4* 08/02/2014    HCT 25.4* 08/02/2014    MCV 98.7 08/02/2014    PLT 57* 08/02/2014    LABLYMP 1.6 07/19/2014    MID 0.4 07/19/2014    GRAN 0.8* 07/19/2014    LYMPHOPCT 42.2 08/02/2014    MIDPERCENT 13.4 07/19/2014    GRANULOCYTES 29.6* 07/19/2014    RBC 2.57* 08/02/2014    MCH 32.5 08/02/2014    MCHC 32.9 08/02/2014    RDW 15.5* 08/02/2014       Lab Results   Component Value Date    NA 136 08/02/2014  K 4.9 08/02/2014    CL 108 08/02/2014    CO2 22 08/02/2014    BUN 23* 08/02/2014    CREATININE 0.7* 08/02/2014    GLUCOSE 107* 08/02/2014    CALCIUM 7.9* 08/02/2014    PROT 4.6* 07/31/2014    LABALBU 1.6* 07/31/2014    BILITOT 1.6* 07/31/2014    ALKPHOS 64 07/31/2014    AST 32 07/31/2014    ALT 15 07/31/2014    LABGLOM >60 08/02/2014    GFRAA >60 08/02/2014    AGRATIO 0.4* 07/26/2014    GLOB 3.7 07/26/2014       No results found for: PTINR    TUMOR MARKERS:    Lab Results   Component Value Date    PSA 0.55 08/15/2013         IMAGING:     Xr Chest Standard Two Vw    07/25/2014   EXAMINATION: TWO VIEWS OF THE CHEST  07/25/2014 8:17 am  COMPARISON: Chest x-ray Jul 07, 2014  HISTORY: ORDERING SYSTEM PROVIDED HISTORY: generalized weakness, hypotension TECHNOLOGIST PROVIDED HISTORY: Ordering Physician Provided Reason for Exam: weakness Acuity: Unknown Type of Exam: Unknown  FINDINGS: There is new focal density of the right upper lobe above the hilum.  There also is new faint airspace disease of the lateral right lung base.  There is new focal opacity of the retrocardiac left lower lobe.  There is blunting of the left costophrenic angle consistent with pleural fluid.     07/25/2014   IMPRESSION: New  bilateral multifocal airspace disease, most likely atelectasis or pneumonia although follow-up to ensure resolution is recommended.  New small to moderate size left pleural effusion.     Ct Head Wo Contrast    07/07/2014   EXAMINATION: CT OF THE HEAD WITHOUT CONTRAST  07/07/2014 11:11 pm  TECHNIQUE: CT of the head was performed without the administration of intravenous contrast.  COMPARISON: 04/01/2014  HISTORY: ORDERING SYSTEM PROVIDED HISTORY: dizzy TECHNOLOGIST PROVIDED HISTORY: Ordering Physician Provided Reason for Exam: dizzy  FINDINGS: BRAIN/VENTRICLES: There is no acute intracranial hemorrhage, mass effect or midline shift.  No abnormal extra-axial fluid collection.  The gray-white differentiation is maintained without evidence of an acute infarct.  There is no evidence of hydrocephalus.  ORBITS: The visualized portion of the orbits demonstrate no acute abnormality.  SINUSES: There is minimal mucosal thickening in the left maxillary sinus. The remaining visualized paranasal sinuses are clear.  The mastoid air cells are unremarkable.  SOFT TISSUES/SKULL:  No acute abnormality of the visualized skull or soft tissues.     07/07/2014   IMPRESSION: No acute intracranial abnormality.     US Abdomen Complete    07/08/2014   EXAMINATION: COMPLETE ABDOMINAL ULTRASOUND 07/08/2014 6:04 pm  COMPARISON: None  HISTORY: ORDERING SYSTEM PROVIDED HISTORY: CIRRHOSIS TECHNOLOGIST PROVIDED HISTORY: Ordering Physician Provided Reason for Exam: cirrhosis, r/o ascites Acuity: Acute Type of Exam: Initial  FINDINGS: Study is limited by body habitus.  LIVER: Evaluation of the liver is limited.  There is diffuse increased echogenicity with no focal lesion noted.  Somewhat nodular contour of the liver is compatible with known cirrhosis.  BILIARY SYSTEM: Limited imaging of the gallbladder is grossly unremarkable.  Common bile duct is within normal limits measuring 6 mm.  KIDNEYS: Right kidney is nonvisualized.  Left kidney measures 10.1 x 4.9 x  5.3 cm and is grossly unremarkable.  PANCREAS: Pancreas is nonvisualized.  SPLEEN: Spleen measures 10.1 cm.  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  IVC: The IVC is patent.  AORTA: Aorta is obscured.  OTHER: No evidence of ascites.     07/08/2014   IMPRESSION: Limited study as above.  Cirrhosis and portal hypertension.  No evidence of ascites.     Xr Chest Portable    07/08/2014   EXAMINATION: SINGLE VIEW OF THE CHEST  07/07/2014 11:34 pm  COMPARISON: 06/03/2014  HISTORY: ORDERING SYSTEM PROVIDED HISTORY: other TECHNOLOGIST PROVIDED HISTORY: Ordering Physician Provided Reason for Exam: dizziness Acuity: Unknown Type of Exam: Unknown  FINDINGS: Cardiac leads project over the chest.  Diffuse interstitial opacity is demonstrated bilaterally.  Heterogeneous opacity is seen adjacent to the left heart border.  No evidence of pleural effusion.  Negative for pneumothorax. Cardiac and mediastinal silhouettes are similar to prior.  Old posterior left 7th rib fracture.     07/08/2014   IMPRESSION: Mild pulmonary edema.     Vl Extremity Venous Right    07/10/2014   Lower Extremities DVT Study   Demographics    Patient Name      Andrew [name]    Date of Study     07/09/2014          Gender              Male    Patient Number    [id number]          Date of Birth       [birth year]    Visit Number      [id number]         Age                 74 year(s)    Accession Number  [id number]           Room Number         3762    Corporate ID      [id number]            Sonographer         Erasmo Leventhal,                                                            RVS    Ordering          Deniece Portela,   Interpreting        [facility]  Physician         MD                  Physician           Peggyann Shoals,                                                            MD, [facility], [place]   Procedure  Type of Study:    Veins:Lower Extremities DVT Study, VL EXTREMITY VENOUS DUPLEX RIGHT.    Generic Orders:VL EXTREMITY VENOUS DUPLEX RIGHT.    Vascular Sonographer  Report   Additional Indications:Swelling  Impressions Right Impression Acute totally occluding deep vein thrombosis involving the right proximal PTV and one gastroc vein zone 5-6. Acute partially occluding deep vein thrombosis involving the popliteal vein  and right one gastroc vein zone 5-6. No other evidence of deep vein or superficial vein thrombosis involving the right lower extremity and the left common femoral vein. Calf veins were not well visualized on the right due to patient positioning and edema.  Verbal to [employer].  Conclusions    Summary    Acute totally occluding deep vein thrombosis involving the right proximal  PTV and one gastroc vein zone 5-6.  Acute partially occluding deep vein thrombosis involving the popliteal vein  and right one gastroc vein zone 5-6.  Calf veins were not well visualized on the right due to patient positioning  and edema.    Signature    Electronically signed by Peggyann Shoals, MD, [facility], RPVI  (Interpreting physician) on 07/10/2014 at 11:34 AM    Cta Pulmonary With Contrast    06/27/2014   EXAMINATION: CTA OF THE CHEST 06/27/2014 2:53 pm  TECHNIQUE: CTA of the chest was performed after the administration of intravenous contrast.  Multiplanar reformatted images are provided for review.  MIP images are provided for review.  Patient received 100 mL Isovue 370 intravenous contrast.  COMPARISON: CT chest with contrast June 13, 2014.  HISTORY: ORDERING SYSTEM PROVIDED HISTORY: DVT (deep venous thrombosis), unspecified laterality [facility]) TECHNOLOGIST PROVIDED HISTORY: Ordering Physician Provided Reason for Exam: DVT, SOB Injury/Trauma or Illness: Illness/Other Acuity: Acute Type of Exam: Initial Relevant Medica/Surgical History: CHF, cardiomyopathy  FINDINGS: Pulmonary Arteries: Pulmonary arteries are adequately opacified for evaluation.  No evidence of intraluminal filling defect to suggest pulmonary embolism.  Main pulmonary artery is normal in caliber.  Mediastinum: No evidence  of mediastinal lymphadenopathy.  Heart is enlarged, similar in appearance.  The heart and pericardium demonstrate no acute abnormality.  There is no acute abnormality of the thoracic aorta. Nodularity of thyroid similar in appearance.  Lungs/pleura: The central airways are patent.  There is a moderate right-sided pleural effusion which appears slightly decreased compared to prior study.  There is mild patchy dependent opacities in the right upper lobe, similar in appearance.  There are dependent subpleural opacities within the lingula and left lower lobe.  There is mild opacity within the right middle lobe which is linear in appearance in appears slightly improved compared to prior study.  2 nodular appearing densities are again seen in the right lower lobe on image number 65, unchanged.  No pneumothorax.  Upper Abdomen: Cirrhotic liver with mildly distended gallbladder is similar in appearance to prior study.  Soft Tissues/Bones: Multiple compression fractures again seen, most pronounced at the thoracolumbar junction, similar in appearance.  Old right-sided rib fractures are also seen.  No acute bone or soft tissue abnormality.  Gynecomastia is again seen.     06/27/2014   IMPRESSION: No evidence of pulmonary embolism.  Moderate right pleural effusion, minimally decreased.  Persistent patchy areas of airspace opacity which may be related to atelectasis versus pneumonia, slightly improved.  2 nodular appearing densities are seen within the right lower lobe which may be related to the airspace disease or represent lung nodules.  Follow-up recommended with dedicated chest CT in 3 months.  Remote compression fractures again seen.  Cirrhotic liver.     Vl Extremity Venous Bilateral    07/09/2014   Vascular Lower Extremities DVT Study Procedure  -- PRELIMINARY SONOGRAPHER REPORT --    Demographics    Patient Name      Andrew [name]    Date of Study     07/09/2014  Gender              Male    Patient Number     [id number]          Date of Birth       Nov 03, 1940    Visit Number      [id number]         Age                 49 year(s)    Accession Number  [id number]           Room Number         1093    Corporate ID      [id number]            Sonographer         Erasmo Leventhal,                                                            RVS    Ordering          Deniece Portela,   Interpreting        [facility]  Physician         MD                  Physician   Procedure  Type of Study:    Veins:Lower Extremities DVT Study, VL EXTREMITY VENOUS DUPLEX RIGHT.   Tech Comments Right Acute totally occluding deep vein thrombosis involving the right proximal PTV and one gastroc vein zone 5-6. Acute partially occluding deep vein thrombosis involving the popliteal vein and right one gastroc vein zone 5-6. No other evidence of deep vein or superficial vein thrombosis involving the right lower extremity and the left common femoral vein. Calf veins were not well visualized on the right due to patient positioning and edema.  verbal to [employer].    Vl Extremity Venous Bilateral    06/27/2014   Lower Extremities DVT Study   Demographics    Patient Name       Andrew [name]    Date of Study      06/27/2014        Gender              Male    Patient Number     [id number]        Date of Birth       1940/06/07    Visit Number       [id number]       Age                 50 year(s)    Accession Number   [id number]         Room Number    Corporate ID       [id number]          Sonographer         Erasmo Leventhal,                                                           [place]  Ordering Physician Lacretia Leigh, MD  Interpreting        [facility]                                       Physician           Darlyn Chamber MD,                                                           [facility]   Procedure  Type of Study:    Veins:Lower Extremities DVT Study, VASC EXTREMITY VENOUS DUPLEX BILATERAL.    Vascular Sonographer Report   Additional  Indications:Swelliing  Impressions Right Impression Acute partially occluding deep vein thrombosis involving the right set of gastroc veins zone 5-6. No other evidence of deep vein or superficial vein thrombosis involving the right lower extremity. Reflux noted in the right FV. Left Impression No evidence of deep vein or superficial vein thrombosis involving the left lower extremity.  Verbal to Dr. Jeb Levering. Patient released.  Conclusions    Summary    -Acute partially occluding deep vein thrombosis involving the right set of  gastroc veins zone 5-6.  -Reflux noted in the right FV.    Signature    ------------------------------------------------------------------  Electronically signed by Darlyn Chamber MD, [facility] (Interpreting  physician) on 06/27/2014 at 01:38 PM  ------------------------------------------------------------------   Patient Status:Routine. [place] [facility]. Technical Quality:Adequate visualization.  Velocities are measured in cm/s ; Diameters are measured in mm  Right Lower Extremities DVT Study Measurements Right 2D Measurements Location                !Visualized!Compressibility!Thrombosis!     STAGING:     No matching staging information was found for the patient.    ASSESSMENT AND PLAN:     1.  Hepatic encephalopathy, resolved.   2.  Cirrhosis with anti-trypsin deficiency.  3.  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 Counts stable.   4.  Recent leg DVT.  Lovenox discontinued because of hematoma. IVC filter placed.  5.  Pneumonia.     Explained to the patient and the wife.  Will not consider Vidaza unless his performance status improves.     Eulis Canner, M.D.; M.S.  Medical Oncology/Hematology  Phone: [phone]  Fax: [phone]    [facility]  [address] [place] [address] [place] [address]    [place] [address]    [place] [address]

## 2014-08-02 NOTE — Progress Notes (Signed)
[facility]        PATIENT NAME: Andrew [name]     TODAY'S DATE: 08/02/2014    SUBJECTIVE:    Pt up working with physical therapy, wife at bedside.  No complaints.       OBJECTIVE:  VITALS:  BP 97/60 mmHg   Pulse 88   Temp(Src) 98 ??F (36.7 ??C) (Temporal)   Resp 16   Ht 5\' 5"  (1.651 m)   Wt 192 lb 6.4 oz (87.272 kg)   BMI 32.02 kg/m2   SpO2 92%    INTAKE/OUTPUT:    I/O last 3 completed shifts:  In: 1837.5 [I.V.:1837.5]  Out: 2 [Stool:2]       CONSTITUTIONAL:  awake and alert  LUNGS:  Non increased WOB  ABDOMEN:  soft, non-distended, non-tender   EXT: Left lateral with leg ecchymosis better today, extending to mid calf, Edema improving, thigh less tight +2 PD pulses bilaterally     Data:  CBC:   Recent Labs      07/31/14   0617   08/01/14   0014  08/01/14   0551  08/02/14   0536   WBC  2.9*   --    --   2.4*  2.3*   HGB  7.5*   < >  7.7*  6.9*  8.4*   HCT  22.9*   < >  23.2*  20.9*  25.4*   PLT  43*   --    --   42*  57*    < > = values in this interval not displayed.     BMP:    Recent Labs      07/31/14   0617  08/01/14   0551  08/02/14   0537   NA  132*  136  136   K  4.8  5.4*  4.9   CL  106  109  108   CO2  20*  23  22   BUN  28*  29*  23*   CREATININE  0.8  0.8  0.7*   GLUCOSE  114*  122*  107*     Hepatic:   Recent Labs      07/31/14   0617   AST  32   ALT  15   BILITOT  1.6*   ALKPHOS  64     Mag:    No results for input(s): MG in the last 72 hours.   Phos:   No results for input(s): PHOS in the last 72 hours.   INR:   Recent Labs      08/01/14   0745   INR  1.33*       ASSESSMENT/PLAN:      74 y.o. male admitted with   1. Pneumonia of both lungs due to infectious organism, unspecified part of lung    2. Generalized weakness    3. Hypotension, unspecified hypotension type    4.  left gluteus muscle mass/hematoma      Improving, Afebrile, Hgb up after PRBC, pt continues to have more energy, more awake and moving around more, ecchymosis down lateral aspect of left leg  improving, edema improving     Continued medical management  No s/s of infection or compression - no plan for surgical intervention   Will follow prn    Discussed with patient, spouse and nursing.  Discussed with Dr. Doroteo Glassman      ?? DVT prophylaxis: SCD's  ?? The patient is  not currently smoking. The risks related to smoking were reviewed with the patient. Recommend maintaining a smoke-free lifestyle. Products available for smoking cessation were discussed in detail.    Delorse Limber, CNP    Surg Staff:  Pt seen and examined with NP  See full note above  Bleeding stopped, up walking and doing better  Will see prn, thanks    Resa Miner [employer]

## 2014-08-02 NOTE — Plan of Care (Signed)
Problem: SAFETY  Goal: Free from accidental physical injury  Intervention: ASSESS FOR FALL RISK  Pt is a high fall risk. Pt remains free from falls, throughout night. Bed alarm remains in place, door open. Pt encouraged to use call light for needs throughout night; call light is within reach. Bed lock is in lowest position. Will continue to monitor throughout night.

## 2014-08-02 NOTE — Progress Notes (Signed)
Physical Therapy  Facility/Department: [facility]  Daily Treatment Note  NAME: Andrew [name]  DOB: 09-18-1940  MRN: [id number]    Date of Service: 08/02/2014    Patient Diagnosis(es):   Patient Active Problem List    Diagnosis Date Noted   ??? Erectile dysfunction 04/30/2009     Priority: Low   ??? Hematoma 07/31/2014   ??? Pneumonia of both lungs due to infectious organism    ??? Traumatic hematoma of buttock    ??? Pneumonia 07/25/2014   ??? 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 DVT (deep venous thrombosis) ([place]) 07/10/2014   ??? Hepatic encephalopathy ([place]) 07/07/2014   ??? Ataxia 07/07/2014   ??? Pancytopenia ([place]) 07/07/2014   ??? Benign essential HTN 07/03/2014   ??? Bronchiectasis without complication ([place]) 94/[id number]/4627   ??? 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 Anemia 04/02/2014   ??? Hemispheric carotid artery syndrome    ??? CAD in native artery    ??? Hyperlipidemia    ??? Abnormal EKG 04/01/2014   ??? Visual changes 04/01/2014   ??? Lumbar compression fracture ([place]) 01/14/2014   ??? Chronic low back pain 01/14/2014   ??? Osteopenia 01/14/2014   ??? Coagulopathy ([place]) 01/14/2014   ??? 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 Chronic systolic CHF (congestive heart failure) ([place]) 10/18/2013   ??? Mild CAD    ??? Cirrhosis, nonalcoholic ([place])    ??? 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 BPH (benign prostatic hyperplasia) 08/01/2009   ??? Thrombocytopenia 04/30/2009   ??? Edema 04/30/2009       Past Medical History   Diagnosis Date   ??? Hypertension    ??? Kidney stones    ??? Alpha 1 antitrypsin deficiency    ??? Thrombocytopenia ([place]) 04/30/2009   ??? BPH (benign prostatic hyperplasia)    ??? 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 Chronic systolic CHF (congestive heart failure) ([place]) 10/18/2013   ??? CAD (coronary artery disease)      Non-obstructive   ??? Compression fracture of lumbar vertebra ([facility])      multiple, lumbar   ??? Leukopenia    ??? Anemia    ???  Pneumonia    ??? DVT (deep venous thrombosis) ([place])    ??? Cancer ([place])    ??? Hyperlipidemia    ??? Arthritis    ??? Glaucoma    ??? Liver disease    ??? 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 Av fistula repair  1970's   ??? Colonoscopy  4/12     5y   ??? Cardiac catheterization     ??? Eye surgery  04/12/2014     Laser Eye Surgery for glaucoma   ??? Cosmetic surgery  04/2013     eye lids   ??? Bone marrow biopsy     ??? Knee surgery         Restrictions  Restrictions/Precautions  Restrictions/Precautions: Fall Risk (high fall risk)  Required Braces or Orthoses?: No  Position Activity Restriction  Other position/activity restrictions: Nurse approved prior to therapy. IVC filter placed 07/31/14  Subjective   General  Chart Reviewed: Yes  Additional Pertinent Hx: Myelodysplasia, HTN, cirrhosis, chronic systolic CHF, CAD, cancer  Response To Previous Treatment: Patient with no complaints from previous session.  Family / Caregiver Present: Yes (wife )  Subjective  Subjective: Pt agreeable to PT tx session. OT present to progress functional mobility and for safety.   General Comment  Comments: Pt seated in chair upon initiation of session.   Pain Screening  Patient Currently in Pain: Denies  Vital Signs  Patient Currently in Pain: Denies       Orientation  Orientation  Overall Orientation Status: Within Functional Limits  Objective   Bed Mobility  Supine to Sit: Unable to assess (comment) (Pt seated upright in chair upon initiation/completion of session. )  Transfers  Sit to Stand: Minimal Assistance (verbal and tactile cues for hand placement)  Stand to sit: Minimal Assistance  Ambulation  Ambulation?: Yes  Ambulation 1  Surface: level tile  Device: Hand-Held Assist (bilateral hand held assist to promote upright posture and positioning with ambulation )  Assistance: Dependent/Total (minA x2)  Quality of Gait: Pt presents with shuffling steps, decreased step length  bilaterally,  forward flexed posture, and decreased gait speed; no loss of balance. Pt with preference for forward flexed posture with cervical flexion and gaze toward floor. Verbal and tactile cueing to facilitate upright posture and positioning and to increased step length bilaterally.   Distance: 150'   Comments: Increased fatigue noted as ambulation progressed requiring increased verbal and tactile cues for posturing and positioning throughout gait cycle.   Stairs/Curb  Stairs?: No     Balance  Posture: Fair  Sitting - Static: Good  Sitting - Dynamic: Good  Standing - Static: Fair (minA )  Standing - Dynamic: Fair (minA x2 for ambulation without AD)  Exercises  Comments: Patient fatigued following ambulation. Reviewed lower extremity seated and supine ther-ex with pt and wife. Reviewed LAQ, SAQ, QS, ankle pumps, glut sets, hip flexion marches, pillow adduction squeezes, and scapular retraction to promote upright posture. Discussed completion of 5-15 reps (1 set)  2x daily with focus on quality vs quantitiy. Pt and wife in agreement.        Assessment   Assessment: Decreased functional mobility ;Decreased strength;Decreased safe judgement ;Decreased endurance;Decreased balance  Assessment: Pt currently not at baseline functioning related to recent dx and would benefit from skilled PT intervention to address stated deficits and facilitate return to PLOF. Pt currently not safe to discharge home and is at an increased risk of falls.   Treatment Diagnosis: decreased functional mobility, decreased strength, decreased safe judgement, decreased endurance, decreased balance   Prognosis: Fair  Requires PT Follow Up: Yes  Total Treatment Time: 15  Timed Code Treatment Minutes: 15 Minutes  Activity Tolerance  Activity Tolerance: Patient Tolerated treatment well;Patient limited by fatigue  PT D/C Equipment  Equipment Needed:  (TBD at next level of care)       Discharge Recommendations:  [place],  Patient would benefit from additional therapy    Goals  Short term goals  Time Frame for Short term goals: to be met prior to discharge   Short term goal 1: Pt to complete all bed mobility with MOD I.   Short term goal 2: Pt to complete all transfers with MOD I.   Short term goal 3: Pt ambulate 150' with use of least restrictive assistive device and SUPV.   Short term goal 4: Pt to negotiate 1 flight of stairs with handrails and CGA.    No goals met this session; all goals remain appropriate.     Plan    Plan  Times per week: 2-5  Times per day: Daily  Current Treatment Recommendations: Strengthening;Balance Training;Functional Mobility  Training;Endurance Training;Gait Heritage manager;Safety Education & Training;Patient/Caregiver Education & Training  Patient Education: Educated pt on role of PT, POC, and lower extremity ther-ex  Safety Devices  Safety Devices in place: Yes  Type of devices: All fall risk precautions in place;Patient at risk for falls;Call light within reach;Left in chair;Chair alarm in place;Nurse notified;Gait belt  Restraints  Initially in place: No     Genene Churn, PT  License and [place] Number: Genene Churn PT, DPT [phone]

## 2014-08-02 NOTE — Plan of Care (Signed)
Problem: SAFETY  Goal: Free from accidental physical injury  Outcome: Ongoing  Up to chair with PT this morning.  Returned to bed with assistance.  Calls appropriately with needs.  Alarm on, bed in lowest locked position, door open, will continue to monitor, call light within reach, no needs at this time.

## 2014-08-02 NOTE — Care Coordination-Inpatient (Signed)
Medical update faxed to [facility], as 5-27- POSSIBLE WEEKEND DISCHARGE, Sun/Mon  PENDING  [facility] ? [place], [place]

## 2014-08-02 NOTE — Plan of Care (Signed)
Problem: SAFETY  Goal: Free from accidental physical injury  Outcome: Ongoing  Bed in lowest locked position, bed alarm on, wife at bedside, call light within reach, will continue to monitor needs.

## 2014-08-02 NOTE — Progress Notes (Signed)
Occupational Therapy  Daily Treatment Note  Date: 08/02/2014    Patient Name: Andrew [name]  LOV:[id number]     DOB: 10-03-1940     Restrictions  Restrictions/Precautions  Restrictions/Precautions: Fall Risk  Required Braces or Orthoses?: No  Position Activity Restriction  Other position/activity restrictions: Nurse approved prior to therapy. IVC filter placed 07/31/14  Subjective   General  Chart Reviewed: Yes  Additional Pertinent Hx: HTN, kidney stones, cirrhosis, leukopenia, anemia, h/o DVT, cancer, glaucoma, liver disease, myelodysplasia, bone marrow biopsy  Response to previous treatment: Patient with no complaints from previous session  Family / Caregiver Present: Yes  Diagnosis: Pneumonia  Subjective  Subjective: Patient supine in bed, agreeable to therapy.   Pain Assessment  Patient Currently in Pain: Denies  Vital Signs  Patient Currently in Pain: Denies   Orientation  Orientation  Overall Orientation Status: Within Functional Limits  Objective    ADL  LE Dressing: Moderate assistance (mod assist to don/doff socks. )  Toileting: Moderate assistance (mod assist to change attends)  Transfer: Minimal assistance        Balance  Sitting Balance: Stand by assistance  Standing Balance: Contact guard assistance  Standing Balance  Time: @ 30 seconds static stance, 1 minute 30 seconds static stance, ambulation x 5 minutes  After t/f-- requested PT to assist with 2 person ambulation due to patient has not walked in several days.   Activity: Stand step t/f to chair, static stance activity from chair, ambulation x 150 feet with 2 person hand held assist.   Sit to stand: Contact guard assistance  Stand to sit: Contact guard assistance  Functional Mobility  Assist Level: Minimal assistance  Functional Mobility Comments: Patient does best with 2 person hand held assist, needs multiple cues to stand up straight and not look at floor. Ambulated @ 150 feet, slow gait, small steps. See PT notes for further gait analysis.   Bed  mobility  Rolling: Minimal assistance  Supine to Sit: Minimal assistance  Bed to Chair: Minimal assistance  Transfers  Stand Step Transfers: Minimal assistance  Sit to stand: Contact guard assistance  Stand to sit: Contact guard assistance                       Cognition  Arousal/Alertness: Delayed responses to stimuli  Attention Span: Difficulty attending to directions  Insights: Decreased awareness of deficits  Additional Comments: Patient much more awake according to patient and staff. Mild delayed processing but able to follow simple commands and make needs known.      Perception  Overall Perceptual Status: WFL               Assessment   Assessment: Decreased functional mobility ;Decreased functional activity tolerance;Decreased ADL status;Decreased cognition  Comments: Patient is not at baseline level following admission due to pneumonia. Palliative care following, plan is for patient to go to [facility] for SNF.   Treatment Diagnosis: Debility and decreased ADLs following pneumonia  Rehab Potential: Fair;Guarded  Recommendations: SNF consult;Patient would benefit from continued therapy after discharge  Goal Formulation: Patient;Family  Requires OT Follow Up: Yes  Total Treatment Time: 31  Timed Code Treatment Minutes: 31 Minutes  Response to Treatment  Response to Treatment: Patient Tolerated treatment well  Safety Devices  Safety Devices in place: Yes  Type of devices: Patient at risk for falls;All fall risk precautions in place;Left in chair;Call light within reach;Chair alarm in place;Nurse notified  Restraints  Initially in place: No  Discharge Recommendations:  SNF consult, Patient would benefit from continued therapy after discharge     Plan   Plan  Times per week: 2-5  Times per day: Daily  Patient Education: POC, functional mobility, ADLs, bed mobility, walker management and safety, transfers, safety,- pt demonstrated understanding        Short term goals  Time Frame for Short term goals:  until discharge  Short term goal 1: Min assist bathing- Not performed this date, wife stated wants to walk instead  Short term goal 2: Min assist dressing- mod assist to change pull-up attends and socks.   Short term goal 3: CGA funtional mobility with RW or with 1 hand held assist--- 2 person hand held assist, min assist of 2, ambulated x 150 feet with cues.   Short term goal 4: CGA toileting-  mod assist to change pull-up attends.   Long term goals  Time Frame for Long term goals : STGs= LTGs       Sherol Dade, OT  License and Documentation Cosign  Therapy License Number: Astrid Divine. [place], [place]

## 2014-08-02 NOTE — Progress Notes (Signed)
Progress Note - Dr. Tasia Catchings - Internal Medicine  PCP: Alecia Lemming, DO [place] / [place] [id number] [phone]    Hospital Day: 8  Code Status: Full Code  Current Diet: DIET GENERAL; No Added Salt (3-4 GM)        CC: follow up on medical issues    Subjective:   Andrew [name] is a 74 y.o. male.    He denies problems    Doing better  More alert  Counts up after transfusion    He denies chest pain, denies shortness of breath, denies nausea,  denies emesis.    10 system Review of Systems is reviewed with patient, and pertinent positives are listed here: None . Otherwise, Review of systems is negative.     I have reviewed the patient's medical and social history in detail and updated the computerized patient record.  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 He  has past surgical history that includes AV fistula repair (1970's); Colonoscopy (4/12); Cardiac catheterization; eye surgery (04/12/2014); Cosmetic surgery (04/2013); bone marrow biopsy; and knee surgery.[name] He  reports that he has never smoked. He has never used smokeless tobacco. He reports that he does not drink alcohol or use illicit drugs..        Active Hospital Problems    Diagnosis Date Noted   ??? Hematoma [T14.8] 07/31/2014   ??? Pneumonia of both lungs due to infectious organism [J18.9]    ??? Traumatic hematoma of buttock [S30.0XXA]    ??? Pneumonia [J18.9] 07/25/2014   ??? 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 DVT (deep venous thrombosis) (HCC) [I82.409]  07/10/2014   ??? Hepatic encephalopathy (HCC) [K72.90] 07/07/2014   ??? Benign essential HTN [I10] 07/03/2014       Current facility-administered medications: 0.9 % sodium chloride bolus, 250 mL, Intravenous, Once  sodium chloride flush 0.9 % injection 10 mL, 10 mL, Intravenous, 2 times per day  sodium chloride flush 0.9 % injection 10 mL, 10 mL, Intravenous, PRN  acetaminophen (TYLENOL) tablet 650 mg, 650 mg, Oral, Q4H PRN  magnesium hydroxide (MILK OF MAGNESIA) 400 MG/5ML suspension 30 mL, 30 mL, Oral, Daily PRN  ondansetron (ZOFRAN) injection 4 mg, 4 mg, Intravenous, Q6H PRN  0.9 % sodium chloride bolus, 250 mL, Intravenous, Once  lactulose (CHRONULAC) 10 GM/15ML solution 20 g, 20 g, Oral, TID  amoxicillin-clavulanate (AUGMENTIN) 875-125 MG per tablet 1 tablet, 1 tablet, Oral, 2 times per day  ioversol (OPTIRAY) 68 % injection 100 mL, 100 mL, Intravenous, ONCE PRN  sodium chloride (PF) 0.9 % injection 10 mL, 10 mL, Intravenous, PRN  carvedilol (COREG) tablet 6.25 mg, 6.25 mg, Oral, BID WC  vitamin D (CHOLECALCIFEROL) tablet 1,000 Units, 1,000 Units, Oral, Daily  citalopram (CELEXA) tablet 10 mg, 10 mg, Oral, Daily  eplerenone (INSPRA) tablet 50 mg, 50 mg, Oral, Daily  furosemide (LASIX) tablet 40 mg, 40 mg, Oral, Daily  lisinopril (PRINIVIL;ZESTRIL) tablet 5 mg, 5 mg, Oral, Nightly  magnesium oxide (MAG-OX) tablet 400 mg, 400 mg, Oral, Daily  rifaximin (XIFAXAN) tablet 550 mg, 550 mg, Oral, BID  traMADol (ULTRAM) tablet 50 mg, 50 mg, Oral, Q12H PRN  0.9 % sodium chloride infusion, , Intravenous, Continuous  sodium chloride flush 0.9 % injection 10 mL, 10 mL, Intravenous, 2 times per day  sodium chloride flush 0.9 % injection 10 mL, 10 mL, Intravenous, PRN  acetaminophen (TYLENOL) tablet 650 mg, 650 mg, Oral, Q4H PRN  HYDROcodone-acetaminophen (NORCO) 5-325 MG per tablet 1 tablet, 1 tablet, Oral, Q4H PRN **OR** HYDROcodone-acetaminophen (NORCO) 5-325 MG per tablet 2 tablet, 2 tablet, Oral, Q4H PRN  morphine (PF)  injection 2 mg, 2 mg, Intravenous, Q2H PRN **OR** morphine (PF) injection 4 mg, 4 mg, Intravenous, Q2H PRN  magnesium hydroxide (MILK OF MAGNESIA) 400 MG/5ML suspension 30 mL, 30 mL, Oral, Daily PRN  ondansetron (ZOFRAN) injection 4 mg, 4 mg, Intravenous, Q6H PRN  potassium chloride SA (K-DUR;KLOR-CON M) tablet 40 mEq, 40 mEq, Oral, PRN **OR** potassium chloride 20 MEQ/15ML (10%) oral solution 40 mEq, 40 mEq, Oral, PRN **OR** potassium chloride 10 mEq/100 mL IVPB (Peripheral Line), 10 mEq, Intravenous, PRN         Objective:  BP 97/60 mmHg   Pulse 88   Temp(Src) 98 ??F (36.7 ??C) (Temporal)   Resp 16   Ht $R'5\' 5"'Jx$  (1.651 m)   Wt 192 lb 6.4 oz (87.272 kg)   BMI 32.02 kg/m2   SpO2 92%     Patient Vitals for the past 24 hrs:   BP Temp Temp src Pulse Resp SpO2 Weight   08/02/14 0801 - - - - - - 192 lb 6.4 oz (87.272 kg)   08/02/14 0749 97/60 mmHg 98 ??F (36.7 ??C) Temporal 88 16 92 % -   08/02/14 0455 106/66 mmHg 98.4 ??F (36.9 ??C) Axillary 84 18 91 % -   08/01/14 2055 99/64 mmHg 98.4 ??F (36.9 ??C) Oral 82 18 93 % -   08/01/14 1645 112/72 mmHg 98.2 ??F (36.8 ??C) - 77 16 94 % -   08/01/14 1511 116/73 mmHg 98.2 ??F (36.8 ??C) - 83 16 - -   08/01/14 1425 100/62 mmHg 98 ??F (36.7 ??C) - 77 16 - -   08/01/14 1408 98/61 mmHg 97.9 ??F (36.6 ??C) - 78 16 - -   08/01/14 1221 95/60 mmHg 97.8 ??F (36.6 ??C) - 78 16 - -   08/01/14 1119 99/66 mmHg 97.9 ??F (36.6 ??C) - 83 16 - -   08/01/14 1103 98/65 mmHg 97.8 ??F (36.6 ??C) - 86 16 - -   08/01/14 1026 97/62 mmHg 97.6 ??F (36.4 ??C) - 79 16 - -   08/01/14 1015 97/64 mmHg 97.8 ??F (36.6 ??C) - 75 16 - -   08/01/14 1000 103/67 mmHg 97.8 ??F (36.6 ??C) - 81 16 - -     Patient Vitals for the past 96 hrs (Last 3 readings):   Weight   08/02/14 0801 192 lb 6.4 oz (87.272 kg)   08/01/14 0700 190 lb 9.6 oz (86.456 kg)   07/31/14 0657 189 lb 11.2 oz (86.047 kg)           Intake/Output Summary (Last 24 hours) at 08/02/14 7035  Last data filed at 08/02/14 0600   Gross per 24 hour   Intake 1837.5 ml   Output      2 ml    Net 1835.5 ml         Physical Exam:   S1, S2 normal, no murmur, rub or gallop, regular rate and rhythm  clear to auscultation bilaterally  abdomen is soft without significant tenderness, masses, organomegaly or guarding  extremities normal, atraumatic, no  cyanosis or edema    Labs:  Lab Results   Component Value Date    WBC 2.3* 08/02/2014    HGB 8.4* 08/02/2014    HCT 25.4* 08/02/2014    PLT 57* 08/02/2014    CHOL 101 04/02/2014    TRIG 83 04/02/2014    HDL 22* 04/02/2014    ALT 15 07/31/2014    AST 32 07/31/2014    NA 136 08/02/2014    K 4.9 08/02/2014    CL 108 08/02/2014    CREATININE 0.7* 08/02/2014    BUN 23* 08/02/2014    CO2 22 08/02/2014    TSH 2.15 08/15/2013    PSA 0.55 08/15/2013    INR 1.33* 08/01/2014    LABA1C 5.0 01/10/2014    LABMICR YES 07/25/2014     Lab Results   Component Value Date    TROPONINI <0.01 07/25/2014       Recent Imaging Results are Reviewed:  Xr Chest Standard Two Vw    07/27/2014   EXAMINATION: TWO VIEWS OF THE CHEST  07/27/2014 5:52 pm  COMPARISON: 07/25/2014  HISTORY: ORDERING SYSTEM PROVIDED HISTORY: follow up pneumonia TECHNOLOGIST PROVIDED HISTORY: Ordering Physician Provided Reason for Exam: follow up pneumonia Acuity: Acute Type of Exam: Initial  FINDINGS: Compression fracture of the lower thoracic spine is again seen.  Minimal right base atelectasis is noted.  There is scarring on the left.  Remote rib fractures are seen.  Cardiac silhouette within normal limits.  There is a hiatal hernia     07/27/2014   IMPRESSION: No acute disease     Xr Chest Standard Two Vw    07/25/2014   EXAMINATION: TWO VIEWS OF THE CHEST  07/25/2014 8:17 am  COMPARISON: Chest x-ray Jul 07, 2014  HISTORY: ORDERING SYSTEM PROVIDED HISTORY: generalized weakness, hypotension TECHNOLOGIST PROVIDED HISTORY: Ordering Physician Provided Reason for Exam: weakness Acuity: Unknown Type of Exam: Unknown  FINDINGS: There is new focal density of the right upper lobe above the hilum.  There also is new faint  airspace disease of the lateral right lung base.  There is new focal opacity of the retrocardiac left lower lobe.  There is blunting of the left costophrenic angle consistent with pleural fluid.     07/25/2014   IMPRESSION: New bilateral multifocal airspace disease, most likely atelectasis or pneumonia although follow-up to ensure resolution is recommended.  New small to moderate size left pleural effusion.     Ct Head Wo Contrast    07/07/2014   EXAMINATION: CT OF THE HEAD WITHOUT CONTRAST  07/07/2014 11:11 pm  TECHNIQUE: CT of the head was performed without the administration of intravenous contrast.  COMPARISON: 04/01/2014  HISTORY: ORDERING SYSTEM PROVIDED HISTORY: dizzy TECHNOLOGIST PROVIDED HISTORY: Ordering Physician Provided Reason for Exam: dizzy  FINDINGS: BRAIN/VENTRICLES: There is no acute intracranial hemorrhage, mass effect or midline shift.  No abnormal extra-axial fluid collection.  The gray-white differentiation is maintained without evidence of an acute infarct.  There is no evidence of hydrocephalus.  ORBITS: The visualized portion of the orbits demonstrate no acute abnormality.  SINUSES: There is minimal mucosal thickening in the left maxillary sinus. The remaining visualized paranasal sinuses are clear.  The mastoid air cells are unremarkable.  SOFT TISSUES/SKULL:  No acute abnormality of the visualized skull or soft tissues.     07/07/2014   IMPRESSION: No acute intracranial abnormality.     Ct Chest W Contrast    07/26/2014   EXAMINATION: CT OF THE CHEST WITH CONTRAST 07/26/2014  2:14 pm  TECHNIQUE: CT of the chest was performed with the administration of intravenous contrast. Multiplanar reformatted images are provided for review. Dose modulation, iterative reconstruction, and/or weight based adjustment of the mA/kV was utilized to reduce the radiation dose to as low as reasonably achievable.  COMPARISON: 06/27/2014  HISTORY: ORDERING SYSTEM PROVIDED HISTORY: SHORTNESS OF BREATH  FINDINGS: Mediastinum:  Stable mediastinal lymph nodes, no convincing adenopathy. Thoracic aorta normal in caliber.  No pericardial effusion.  .  Lungs/pleura: Interval decrease in right pleural effusion.  Stable trace left pleural effusion.  There are multifocal airspace opacities that are predominantly bandlike in morphology, mostly in the lower lobes, right middle lobe, and lingula.  There are a cluster of tiny nodules in the lateral right upper lobe.  There is a stable 1.3 cm nodule in the superior segment of the right lower lobe.  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  There is a tiny nonobstructing right renal stone.  Large venous collaterals are noted adjacent to the spleen  Soft Tissues/Bones: There is bilateral gynecomastia.  There are multiple thyroid nodules     07/26/2014   IMPRESSION: 1. Multifocal airspace opacities in the lower lobes, right middle lobe, and lingula have an appearance compatible with atelectasis, with or without pneumonia.  Cluster of nodular densities in the lateral right upper lobe most likely represents atypical infection.  1.3 cm nodule in the superior segment of the right lower lobe warrants six-month follow-up 2. Interval decrease in right pleural effusion 3. 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  LIVER: Evaluation of the liver is limited.  There is diffuse increased echogenicity with no focal lesion noted.  Somewhat nodular contour of the liver is compatible with known cirrhosis.  BILIARY SYSTEM: Limited imaging of the gallbladder is grossly unremarkable.  Common bile duct is within normal  limits measuring 6 mm.  KIDNEYS: Right kidney is nonvisualized.  Left kidney measures 10.1 x 4.9 x 5.3 cm and is grossly unremarkable.  PANCREAS: Pancreas is nonvisualized.  SPLEEN: Spleen measures 10.1 cm.  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  IVC: The IVC is patent.  AORTA: Aorta is obscured.  OTHER: No evidence of ascites.     07/08/2014   IMPRESSION: Limited study as above.  Cirrhosis and portal hypertension.  No evidence of ascites.     Ct Abdomen Pelvis W Iv Contrast Additional Contrast?: Oral    07/30/2014   EXAMINATION: CT OF THE ABDOMEN AND PELVIS WITH CONTRAST 07/30/2014 11:54 am  TECHNIQUE: CT of the abdomen and pelvis was performed with the administration of intravenous contrast. Multiplanar reformatted images are provided for review. Dose modulation, iterative reconstruction, and/or weight based adjustment of the mA/kV was utilized to reduce the radiation dose to as low as reasonably achievable.  COMPARISON: 09/07/2010  HISTORY: ORDERING SYSTEM PROVIDED HISTORY: ABD PAIN, FEVER, NO RECENT SURGERY TECHNOLOGIST PROVIDED HISTORY: Additional Contrast?->Oral Ordering Physician Provided Reason for Exam: ABD PAIN, FEVER, NO RECENT SURGERY Acuity: Unknown Type of Exam: Unknown  Unexplained anemia.  Evaluate for retroperitoneal hemorrhage.  FINDINGS: Lower Chest: There is mild bibasilar pleural and parenchymal disease, new from the prior study.  Organs:  The liver is shrunken with a nodular contour consistent with cirrhosis.  No focal liver lesion.  The main portal vein is small in size and grossly patent.  There is a recannulized umbilical vein.  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  GI/Bowel: Bowel loops are unremarkable.  The gallbladder is moderately distended.  There are no calcified gallstones.  Pelvis: Urinary bladder, prostate gland and seminal vesicles are unremarkable.  Peritoneum/Retroperitoneum: No adenopathy, free air or free fluid.  Bones/Soft Tissues: There is bilateral flank edema.   There is a large left gluteus muscle mass/hematoma extending into the left upper thigh.     07/30/2014   IMPRESSION: Large left gluteus muscle mass/hematoma likely accounting for the unexplained anemia.  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     Xr Chest Portable    07/08/2014   EXAMINATION: SINGLE VIEW OF THE CHEST  07/07/2014 11:34 pm  COMPARISON: 06/03/2014  HISTORY: ORDERING SYSTEM PROVIDED HISTORY: other TECHNOLOGIST PROVIDED HISTORY: Ordering Physician Provided Reason for Exam: dizziness Acuity: Unknown Type of Exam: Unknown  FINDINGS: Cardiac leads project over the chest.  Diffuse interstitial opacity is demonstrated bilaterally.  Heterogeneous opacity is seen adjacent to the left heart border.  No evidence of pleural effusion.  Negative for pneumothorax. Cardiac and mediastinal silhouettes are similar to prior.  Old posterior left 7th rib fracture.     07/08/2014   IMPRESSION: Mild pulmonary edema.     Vl Extremity Venous Right    07/10/2014   Lower Extremities DVT Study   Demographics    Patient Name      Andrew [name]    Date of Study     07/09/2014          Gender              Male    Patient Number    [id number]          Date of Birth       1940-04-28    Visit Number      [id number]         Age                 14 year(s)    Accession Number  [id number]           Room Number         0347    Corporate ID      [id number]            Sonographer         Erasmo Leventhal,                                                            RVS    Ordering          Deniece Portela,   Interpreting        [facility]  Physician         MD                  Physician           Peggyann Shoals,  MD, [facility], RPVI   Procedure  Type of Study:    Veins:Lower Extremities DVT Study, VL EXTREMITY VENOUS DUPLEX RIGHT.    Generic Orders:VL EXTREMITY VENOUS DUPLEX RIGHT.    Vascular Sonographer Report   Additional Indications:Swelling  Impressions Right  Impression Acute totally occluding deep vein thrombosis involving the right proximal PTV and one gastroc vein zone 5-6. Acute partially occluding deep vein thrombosis involving the popliteal vein and right one gastroc vein zone 5-6. No other evidence of deep vein or superficial vein thrombosis involving the right lower extremity and the left common femoral vein. Calf veins were not well visualized on the right due to patient positioning and edema.  Verbal to [employer].  Conclusions    Summary    Acute totally occluding deep vein thrombosis involving the right proximal  PTV and one gastroc vein zone 5-6.  Acute partially occluding deep vein thrombosis involving the popliteal vein  and right one gastroc vein zone 5-6.  Calf veins were not well visualized on the right due to patient positioning  and edema.    Signature    ------------------------------------------------------------------  Electronically signed by Moses Manners, MD, [facility], RPVI  (Interpreting physician) on 07/10/2014 at 11:34 AM  ------------------------------------------------------------------   Patient Status:Routine. Study Location:[facility] - Vascular Lab. Technical Quality:Poor visualization.  Risk Factors History +------------------+----------+----------------------------------------------+ !Diagnosis         !Date      !Comments                                      ! +------------------+----------+----------------------------------------------+ !Previous Procedure!06/27/2014!Venous Doppler Bilateral: Acute DVT right     ! !                  !          !gastroc veins.                                ! +------------------+----------+----------------------------------------------+  Velocities are measured in cm/s ; Diameters are measured in mm  Right Lower Extremities DVT Study Measurements Right 2D Measurements +------------------------+----------+---------------+----------+ !Location                 !Visualized!Compressibility!Thrombosis! +------------------------+----------+---------------+----------+ !Sapheno Femoral Junction!Yes       !Yes            !None      ! +------------------------+----------+---------------+----------+ !GSV Thigh               !Yes       !Yes            !None      ! +------------------------+----------+---------------+----------+ !Common Femoral          !Yes       !Yes            !None      ! +------------------------+----------+---------------+----------+ !Prox Femoral            !Yes       !Yes            !None      ! +------------------------+----------+---------------+----------+ !Mid Femoral             !Yes       !Yes            !None      ! +------------------------+----------+---------------+----------+ !Dist Femoral            !  Yes       !Yes            !None      ! +------------------------+----------+---------------+----------+ !Deep Femoral            !Yes       !Yes            !None      ! +------------------------+----------+---------------+----------+ !Popliteal               !Yes       !Partial        !Acute     ! +------------------------+----------+---------------+----------+ !GSV Below Knee          !Yes       !Yes            !None      ! +------------------------+----------+---------------+----------+ !Gastroc                 !Yes       !Partial        !Acute     ! +------------------------+----------+---------------+----------+ !PTV                     !Yes       !No             !Acute     ! +------------------------+----------+---------------+----------+ !Peroneal                !Yes       !Yes            !None      ! +------------------------+----------+---------------+----------+ !SSV                     !Yes       !Yes            !None      ! +------------------------+----------+---------------+----------+  Right Doppler Measurements +--------------+------+------+------------+ !Location      !Signal!Reflux!Reflux (sec)!  +--------------+------+------+------------+ !Common Femoral!Phasic!      !            ! +--------------+------+------+------------+ !Femoral       !Phasic!      !            ! +--------------+------+------+------------+ !Deep Femoral  !Phasic!      !            ! +--------------+------+------+------------+ !Popliteal     !Phasic!      !            ! +--------------+------+------+------------+  Left Lower Extremities DVT Study Measurements Left 2D Measurements +------------------------+----------+---------------+----------+ !Location                !Visualized!Compressibility!Thrombosis! +------------------------+----------+---------------+----------+ !Sapheno Femoral Junction!Yes       !Yes            !None      ! +------------------------+----------+---------------+----------+ !Common Femoral          !Yes       !Yes            !None      ! +------------------------+----------+---------------+----------+  Left Doppler Measurements +--------------+------+------+------------+ !Location      !Signal!Reflux!Reflux (sec)! +--------------+------+------+------------+ !Common Femoral!Phasic!      !            ! +--------------+------+------+------------+    Vl Extremity Venous Bilateral    07/09/2014   Vascular Lower Extremities DVT Study Procedure  -- PRELIMINARY SONOGRAPHER REPORT --    Demographics    Patient Name  Andrew [name]    Date of Study     07/09/2014          Gender              Male    Patient Number    [id number]          Date of Birth       15-May-1940    Visit Number      [id number]         Age                 14 year(s)    Accession Number  [id number]           Room Number         4854    Corporate ID      [id number]            Sonographer         Erasmo Leventhal,                                                            RVS    Ordering          Deniece Portela,   Interpreting        [facility]  Physician         MD                  Physician   Procedure  Type of Study:    Veins:Lower Extremities DVT  Study, VL EXTREMITY VENOUS DUPLEX RIGHT.   Tech Comments Right Acute totally occluding deep vein thrombosis involving the right proximal PTV and one gastroc vein zone 5-6. Acute partially occluding deep vein thrombosis involving the popliteal vein and right one gastroc vein zone 5-6. No other evidence of deep vein or superficial vein thrombosis involving the right lower extremity and the left common femoral vein. Calf veins were not well visualized on the right due to patient positioning and edema.  verbal to [employer].      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

## 2014-08-02 NOTE — Progress Notes (Signed)
Assessment complete, VSS.  New edema noted, physician aware, fluids discontinued.  Will continue to monitor, wife at bedside, no needs at this time.

## 2014-08-03 LAB — BASIC METABOLIC PANEL
Anion Gap: 6 (ref 3–16)
BUN: 21 mg/dL — ABNORMAL HIGH (ref 7–20)
CO2: 23 mmol/L (ref 21–32)
Calcium: 8 mg/dL — ABNORMAL LOW (ref 8.3–10.6)
Chloride: 106 mmol/L (ref 99–110)
Creatinine: 0.6 mg/dL — ABNORMAL LOW (ref 0.8–1.3)
GFR African American: >60 (ref >60)
GFR Non-African American: >60 (ref >60)
Glucose: 99 mg/dL (ref 70–99)
Potassium: 4.8 mmol/L (ref 3.5–5.1)
Sodium: 135 mmol/L — ABNORMAL LOW (ref 136–145)

## 2014-08-03 LAB — CBC WITH AUTO DIFFERENTIAL
Bands Relative: 6 % (ref 0–7)
Basophils %: 0 %
Basophils Absolute: 0 10*3/uL (ref 0.0–0.2)
Eosinophils %: 1 %
Eosinophils Absolute: 0 10*3/uL (ref 0.0–0.6)
Hematocrit: 22.8 % — ABNORMAL LOW (ref 40.5–52.5)
Hemoglobin: 7.6 g/dL — ABNORMAL LOW (ref 13.5–17.5)
Lymphocytes %: 47 %
Lymphocytes Absolute: 0.9 10*3/uL — ABNORMAL LOW (ref 1.0–5.1)
MCH: 33.3 pg (ref 26.0–34.0)
MCHC: 33.4 g/dL (ref 31.0–36.0)
MCV: 99.5 fL (ref 80.0–100.0)
MPV: 8.5 fL (ref 5.0–10.5)
Monocytes %: 16 %
Monocytes Absolute: 0.3 10*3/uL (ref 0.0–1.3)
Neutrophils %: 30 %
Neutrophils Absolute: 0.7 10*3/uL — CL (ref 1.7–7.7)
PLATELET SLIDE REVIEW: DECREASED
Platelets: 50 10*3/uL — ABNORMAL LOW (ref 135–450)
RBC: 2.29 M/uL — ABNORMAL LOW (ref 4.20–5.90)
RDW: 16.1 % — ABNORMAL HIGH (ref 12.4–15.4)
WBC: 1.9 10*3/uL — CL (ref 4.0–11.0)

## 2014-08-03 LAB — IRON AND TIBC: Iron: 140 ug/dL (ref 59–158)

## 2014-08-03 LAB — FERRITIN: Ferritin: 2323 ng/mL — ABNORMAL HIGH (ref 30.0–400.0)

## 2014-08-03 MED ORDER — DARBEPOETIN ALFA 200 MCG/ML IJ SOLN
200 MCG/ML | Freq: Once | INTRAMUSCULAR | Status: AC
Start: 2014-08-03 — End: 2014-08-03
  Administered 2014-08-03: 14:00:00 200 ug via SUBCUTANEOUS

## 2014-08-03 MED FILL — FUROSEMIDE 40 MG PO TABS: 40 MG | ORAL | Qty: 1

## 2014-08-03 MED FILL — AMOXICILLIN-POT CLAVULANATE 875-125 MG PO TABS: 875-125 MG | ORAL | Qty: 1

## 2014-08-03 MED FILL — NORMAL SALINE FLUSH 0.9 % IV SOLN: 0.9 % | INTRAVENOUS | Qty: 10

## 2014-08-03 MED FILL — VITAMIN D3 25 MCG (1000 UT) PO TABS: 25 MCG (1000 UT) | ORAL | Qty: 1

## 2014-08-03 MED FILL — EPLERENONE 25 MG PO TABS: 25 MG | ORAL | Qty: 2

## 2014-08-03 MED FILL — ARANESP (ALBUMIN FREE) 200 MCG/ML IJ SOLN: 200 MCG/ML | INTRAMUSCULAR | Qty: 1

## 2014-08-03 MED FILL — MAGNESIUM OXIDE 400 (241.3 MG) MG PO TABS: 400 (241.3 Mg) MG | ORAL | Qty: 1

## 2014-08-03 MED FILL — XIFAXAN 550 MG PO TABS: 550 MG | ORAL | Qty: 1

## 2014-08-03 MED FILL — LACTULOSE 10 GM/15ML PO SOLN: 10 GM/15ML | ORAL | Qty: 30

## 2014-08-03 MED FILL — SODIUM CHLORIDE 0.9 % IV SOLN: 0.9 % | INTRAVENOUS | Qty: 250

## 2014-08-03 MED FILL — CARVEDILOL 6.25 MG PO TABS: 6.25 MG | ORAL | Qty: 1

## 2014-08-03 MED FILL — CITALOPRAM HYDROBROMIDE 20 MG PO TABS: 20 MG | ORAL | Qty: 1

## 2014-08-03 NOTE — Plan of Care (Signed)
Problem: SAFETY  Goal: Free from accidental physical injury  Intervention: ASSESS FOR FALL RISK  Pt is a high fall risk. Pt remains free from falls, throughout night. Bed alarm remains in place, door open. Pt encouraged to use call light for needs throughout night; call light is within reach. Bed lock is in lowest position. Will continue to monitor throughout night.

## 2014-08-03 NOTE — Progress Notes (Signed)
Progress Note - Dr. Cathie Hoops - Internal Medicine  PCP: Debbe Bales, DO [address] / [place] [address] [phone]    Hospital Day: 9  Code Status: Full Code  Current Diet: DIET GENERAL; No Added Salt (3-4 GM)        CC: follow up on medical issues    Subjective:   Andrew [name] is a 74 y.o. male.    He denies problems    Labs noted  hgb down to 7.6 again  D/w dr Lavenia Atlas - not sure where end point will be - counts continue to drop     He denies chest pain, denies shortness of breath, denies nausea,  denies emesis.    10 system Review of Systems is reviewed with patient, and pertinent positives are listed here: None . Otherwise, Review of systems is negative.     I have reviewed the patient's medical and social history in detail and updated the computerized patient record.  To recap: He  has a past medical history of Hypertension; Kidney stones; Alpha 1 antitrypsin deficiency; Thrombocytopenia (HCC); BPH (benign prostatic hyperplasia); Cirrhosis, nonalcoholic (HCC); Chronic systolic CHF (congestive heart failure) (HCC); CAD (coronary artery disease); Compression fracture of lumbar vertebra (HCC); Leukopenia; Anemia; Pneumonia; DVT (deep venous thrombosis) (HCC); Cancer (HCC); Hyperlipidemia; Arthritis; Glaucoma; Liver disease; and Myelodysplasia (myelodysplastic syndrome) (HCC).. He  has past surgical history that includes AV fistula repair (1970's); Colonoscopy (4/12); Cardiac catheterization; eye surgery (04/12/2014); Cosmetic surgery (04/2013); bone marrow biopsy; and knee surgery.[name] He  reports that he has never smoked. He has never used smokeless tobacco. He reports that he does not drink alcohol or use illicit drugs..        Active Hospital Problems    Diagnosis Date Noted   ??? Hematoma [T14.8] 07/31/2014   ??? Pneumonia of both lungs due to infectious organism [J18.9]    ??? Traumatic hematoma of buttock [S30.0XXA]    ??? Pneumonia [J18.9] 07/25/2014   ??? Myelodysplasia (myelodysplastic syndrome) (HCC)  [D46.9] 07/19/2014   ??? DVT (deep venous thrombosis) (HCC) [I82.409] 07/10/2014   ??? Hepatic encephalopathy (HCC) [K72.90] 07/07/2014   ??? Benign essential HTN [I10] 07/03/2014       Current facility-administered medications: 0.9 % sodium chloride bolus, 250 mL, Intravenous, Once  sodium chloride flush 0.9 % injection 10 mL, 10 mL, Intravenous, 2 times per day  sodium chloride flush 0.9 % injection 10 mL, 10 mL, Intravenous, PRN  acetaminophen (TYLENOL) tablet 650 mg, 650 mg, Oral, Q4H PRN  magnesium hydroxide (MILK OF MAGNESIA) 400 MG/5ML suspension 30 mL, 30 mL, Oral, Daily PRN  ondansetron (ZOFRAN) injection 4 mg, 4 mg, Intravenous, Q6H PRN  0.9 % sodium chloride bolus, 250 mL, Intravenous, Once  lactulose (CHRONULAC) 10 GM/15ML solution 20 g, 20 g, Oral, TID  amoxicillin-clavulanate (AUGMENTIN) 875-125 MG per tablet 1 tablet, 1 tablet, Oral, 2 times per day  ioversol (OPTIRAY) 68 % injection 100 mL, 100 mL, Intravenous, ONCE PRN  sodium chloride (PF) 0.9 % injection 10 mL, 10 mL, Intravenous, PRN  carvedilol (COREG) tablet 6.25 mg, 6.25 mg, Oral, BID WC  vitamin D (CHOLECALCIFEROL) tablet 1,000 Units, 1,000 Units, Oral, Daily  citalopram (CELEXA) tablet 10 mg, 10 mg, Oral, Daily  eplerenone (INSPRA) tablet 50 mg, 50 mg, Oral, Daily  furosemide (LASIX) tablet 40 mg, 40 mg, Oral, Daily  lisinopril (PRINIVIL;ZESTRIL) tablet 5 mg, 5 mg, Oral, Nightly  magnesium oxide (MAG-OX) tablet 400 mg, 400 mg, Oral, Daily  rifaximin (XIFAXAN) tablet 550 mg,  550 mg, Oral, BID  traMADol (ULTRAM) tablet 50 mg, 50 mg, Oral, Q12H PRN  sodium chloride flush 0.9 % injection 10 mL, 10 mL, Intravenous, 2 times per day  sodium chloride flush 0.9 % injection 10 mL, 10 mL, Intravenous, PRN  acetaminophen (TYLENOL) tablet 650 mg, 650 mg, Oral, Q4H PRN  HYDROcodone-acetaminophen (NORCO) 5-325 MG per tablet 1 tablet, 1 tablet, Oral, Q4H PRN **OR** HYDROcodone-acetaminophen (NORCO) 5-325 MG per tablet 2 tablet, 2 tablet, Oral, Q4H PRN  morphine  (PF) injection 2 mg, 2 mg, Intravenous, Q2H PRN **OR** morphine (PF) injection 4 mg, 4 mg, Intravenous, Q2H PRN  magnesium hydroxide (MILK OF MAGNESIA) 400 MG/5ML suspension 30 mL, 30 mL, Oral, Daily PRN  ondansetron (ZOFRAN) injection 4 mg, 4 mg, Intravenous, Q6H PRN  potassium chloride SA (K-DUR;KLOR-CON M) tablet 40 mEq, 40 mEq, Oral, PRN **OR** potassium chloride 20 MEQ/15ML (10%) oral solution 40 mEq, 40 mEq, Oral, PRN **OR** potassium chloride 10 mEq/100 mL IVPB (Peripheral Line), 10 mEq, Intravenous, PRN         Objective:  BP 112/69 mmHg   Pulse 80   Temp(Src) 98.2 ??F (36.8 ??C) (Temporal)   Resp 20   Ht $R'5\' 5"'NG$  (1.651 m)   Wt 193 lb 8 oz (87.771 kg)   BMI 32.20 kg/m2   SpO2 95%     Patient Vitals for the past 24 hrs:   BP Temp Temp src Pulse Resp SpO2 Weight   08/03/14 0651 - - - - - - 193 lb 8 oz (87.771 kg)   08/03/14 0325 112/69 mmHg 98.2 ??F (36.8 ??C) Temporal 80 20 95 % -   08/02/14 2000 93/57 mmHg 98.7 ??F (37.1 ??C) Oral 77 20 92 % -   08/02/14 1600 109/69 mmHg 97 ??F (36.1 ??C) Temporal 81 16 - -   08/02/14 1300 105/64 mmHg 97.8 ??F (36.6 ??C) Temporal 78 16 95 % -   08/02/14 1100 - - - - - 92 % -     Patient Vitals for the past 96 hrs (Last 3 readings):   Weight   08/03/14 0651 193 lb 8 oz (87.771 kg)   08/02/14 0801 192 lb 6.4 oz (87.272 kg)   08/01/14 0700 190 lb 9.6 oz (86.456 kg)           Intake/Output Summary (Last 24 hours) at 08/03/14 0807  Last data filed at 08/02/14 2128   Gross per 24 hour   Intake    370 ml   Output      0 ml   Net    370 ml         Physical Exam:   S1, S2 normal, no murmur, rub or gallop, regular rate and rhythm  clear to auscultation bilaterally  abdomen is soft without significant tenderness, masses, organomegaly or guarding  extremities normal, atraumatic, no cyanosis or edema    Labs:  Lab Results   Component Value Date    WBC 1.9* 08/03/2014    HGB 7.6* 08/03/2014    HCT 22.8* 08/03/2014    PLT 50* 08/03/2014    CHOL 101 04/02/2014    TRIG 83 04/02/2014    HDL 22*  04/02/2014    ALT 15 07/31/2014    AST 32 07/31/2014    NA 135* 08/03/2014    K 4.8 08/03/2014    CL 106 08/03/2014    CREATININE 0.6* 08/03/2014    BUN 21* 08/03/2014    CO2 23 08/03/2014    TSH 2.15 08/15/2013  PSA 0.55 08/15/2013    INR 1.33* 08/01/2014    LABA1C 5.0 01/10/2014    LABMICR YES 07/25/2014     Lab Results   Component Value Date    TROPONINI <0.01 07/25/2014       Recent Imaging Results are Reviewed:  Xr Chest Standard Two Vw    07/27/2014   EXAMINATION: TWO VIEWS OF THE CHEST  07/27/2014 5:52 pm  COMPARISON: 07/25/2014  HISTORY: ORDERING SYSTEM PROVIDED HISTORY: follow up pneumonia TECHNOLOGIST PROVIDED HISTORY: Ordering Physician Provided Reason for Exam: follow up pneumonia Acuity: Acute Type of Exam: Initial  FINDINGS: Compression fracture of the lower thoracic spine is again seen.  Minimal right base atelectasis is noted.  There is scarring on the left.  Remote rib fractures are seen.  Cardiac silhouette within normal limits.  There is a hiatal hernia     07/27/2014   IMPRESSION: No acute disease     Xr Chest Standard Two Vw    07/25/2014   EXAMINATION: TWO VIEWS OF THE CHEST  07/25/2014 8:17 am  COMPARISON: Chest x-ray Jul 07, 2014  HISTORY: ORDERING SYSTEM PROVIDED HISTORY: generalized weakness, hypotension TECHNOLOGIST PROVIDED HISTORY: Ordering Physician Provided Reason for Exam: weakness Acuity: Unknown Type of Exam: Unknown  FINDINGS: There is new focal density of the right upper lobe above the hilum.  There also is new faint airspace disease of the lateral right lung base.  There is new focal opacity of the retrocardiac left lower lobe.  There is blunting of the left costophrenic angle consistent with pleural fluid.     07/25/2014   IMPRESSION: New bilateral multifocal airspace disease, most likely atelectasis or pneumonia although follow-up to ensure resolution is recommended.  New small to moderate size left pleural effusion.     Ct Head Wo Contrast    07/07/2014   EXAMINATION: CT OF THE HEAD  WITHOUT CONTRAST  07/07/2014 11:11 pm  TECHNIQUE: CT of the head was performed without the administration of intravenous contrast.  COMPARISON: 04/01/2014  HISTORY: ORDERING SYSTEM PROVIDED HISTORY: dizzy TECHNOLOGIST PROVIDED HISTORY: Ordering Physician Provided Reason for Exam: dizzy  FINDINGS: BRAIN/VENTRICLES: There is no acute intracranial hemorrhage, mass effect or midline shift.  No abnormal extra-axial fluid collection.  The gray-white differentiation is maintained without evidence of an acute infarct.  There is no evidence of hydrocephalus.  ORBITS: The visualized portion of the orbits demonstrate no acute abnormality.  SINUSES: There is minimal mucosal thickening in the left maxillary sinus. The remaining visualized paranasal sinuses are clear.  The mastoid air cells are unremarkable.  SOFT TISSUES/SKULL:  No acute abnormality of the visualized skull or soft tissues.     07/07/2014   IMPRESSION: No acute intracranial abnormality.     Ct Chest W Contrast    07/26/2014   EXAMINATION: CT OF THE CHEST WITH CONTRAST 07/26/2014 2:14 pm  TECHNIQUE: CT of the chest was performed with the administration of intravenous contrast. Multiplanar reformatted images are provided for review. Dose modulation, iterative reconstruction, and/or weight based adjustment of the mA/kV was utilized to reduce the radiation dose to as low as reasonably achievable.  COMPARISON: 06/27/2014  HISTORY: ORDERING SYSTEM PROVIDED HISTORY: SHORTNESS OF BREATH  FINDINGS: Mediastinum: Stable mediastinal lymph nodes, no convincing adenopathy. Thoracic aorta normal in caliber.  No pericardial effusion.  .  Lungs/pleura: Interval decrease in right pleural effusion.  Stable trace left pleural effusion.  There are multifocal airspace opacities that are predominantly bandlike in morphology, mostly in the lower lobes, right middle lobe,  and lingula.  There are a cluster of tiny nodules in the lateral right upper lobe.  There is a stable 1.3 cm nodule in the  superior segment of the right lower lobe.  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  There is a tiny nonobstructing right renal stone.  Large venous collaterals are noted adjacent to the spleen  Soft Tissues/Bones: There is bilateral gynecomastia.  There are multiple thyroid nodules     07/26/2014   IMPRESSION: 1. Multifocal airspace opacities in the lower lobes, right middle lobe, and lingula have an appearance compatible with atelectasis, with or without pneumonia.  Cluster of nodular densities in the lateral right upper lobe most likely represents atypical infection.  1.3 cm nodule in the superior segment of the right lower lobe warrants six-month follow-up 2. Interval decrease in right pleural effusion 3. 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  LIVER: Evaluation of the liver is limited.  There is diffuse increased echogenicity with no focal lesion noted.  Somewhat nodular contour of the liver is compatible with known cirrhosis.  BILIARY SYSTEM: Limited imaging of the gallbladder is grossly unremarkable.  Common bile duct is within normal limits measuring 6 mm.  KIDNEYS: Right kidney is nonvisualized.  Left kidney measures 10.1 x 4.9 x 5.3 cm and is grossly unremarkable.  PANCREAS: Pancreas is nonvisualized.  SPLEEN: Spleen measures 10.1 cm.  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  IVC: The IVC is patent.  AORTA: Aorta is obscured.  OTHER: No evidence of ascites.     07/08/2014   IMPRESSION: Limited study as above.  Cirrhosis and portal hypertension.  No evidence of ascites.     Ct Abdomen Pelvis W Iv Contrast  Additional Contrast?: Oral    07/30/2014   EXAMINATION: CT OF THE ABDOMEN AND PELVIS WITH CONTRAST 07/30/2014 11:54 am  TECHNIQUE: CT of the abdomen and pelvis was performed with the administration of intravenous contrast. Multiplanar reformatted images are provided for review. Dose modulation, iterative reconstruction, and/or weight based adjustment of the mA/kV was utilized to reduce the radiation dose to as low as reasonably achievable.  COMPARISON: 09/07/2010  HISTORY: ORDERING SYSTEM PROVIDED HISTORY: ABD PAIN, FEVER, NO RECENT SURGERY TECHNOLOGIST PROVIDED HISTORY: Additional Contrast?->Oral Ordering Physician Provided Reason for Exam: ABD PAIN, FEVER, NO RECENT SURGERY Acuity: Unknown Type of Exam: Unknown  Unexplained anemia.  Evaluate for retroperitoneal hemorrhage.  FINDINGS: Lower Chest: There is mild bibasilar pleural and parenchymal disease, new from the prior study.  Organs: The liver is shrunken with a nodular contour consistent with cirrhosis.  No focal liver lesion.  The main portal vein is small in size and grossly patent.  There is a recannulized umbilical vein.  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  GI/Bowel: Bowel loops are unremarkable.  The gallbladder is moderately distended.  There are no calcified gallstones.  Pelvis: Urinary bladder, prostate gland and seminal vesicles are unremarkable.  Peritoneum/Retroperitoneum: No adenopathy, free air or free fluid.  Bones/Soft Tissues: There is bilateral flank edema.  There is a large left gluteus muscle mass/hematoma extending into the left upper thigh.     07/30/2014  IMPRESSION: Large left gluteus muscle mass/hematoma likely accounting for the unexplained anemia.  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     Xr Chest Portable    07/08/2014   EXAMINATION: SINGLE VIEW OF THE CHEST  07/07/2014 11:34 pm  COMPARISON: 06/03/2014  HISTORY: ORDERING SYSTEM PROVIDED HISTORY: other TECHNOLOGIST PROVIDED HISTORY:  Ordering Physician Provided Reason for Exam: dizziness Acuity: Unknown Type of Exam: Unknown  FINDINGS: Cardiac leads project over the chest.  Diffuse interstitial opacity is demonstrated bilaterally.  Heterogeneous opacity is seen adjacent to the left heart border.  No evidence of pleural effusion.  Negative for pneumothorax. Cardiac and mediastinal silhouettes are similar to prior.  Old posterior left 7th rib fracture.     07/08/2014   IMPRESSION: Mild pulmonary edema.     Vl Extremity Venous Right    07/10/2014   Lower Extremities DVT Study   Demographics    Patient Name      ALICIA ACKERT    Date of Study     07/09/2014          Gender              Male    Patient Number    [id number]          Date of Birth       16-Mar-1940    Visit Number      [id number]         Age                 5 year(s)    Accession Number  [id number]           Room Number         5102    Corporate ID      [id number]            Sonographer         Erasmo Leventhal,                                                            RVS    Ordering          Deniece Portela,   Interpreting        [facility]  Physician         MD                  Physician           Peggyann Shoals,                                                            MD, [facility], [place]   Procedure  Type of Study:    Veins:Lower Extremities DVT Study, VL EXTREMITY VENOUS DUPLEX RIGHT.    Generic Orders:VL EXTREMITY VENOUS DUPLEX RIGHT.    Vascular Sonographer Report   Additional Indications:Swelling  Impressions Right Impression Acute totally occluding deep vein thrombosis involving the right proximal PTV and one gastroc vein zone 5-6. Acute partially occluding deep vein thrombosis involving the popliteal vein and right one gastroc vein zone 5-6. No other evidence of  deep vein or superficial vein thrombosis involving the right lower extremity and the left common femoral vein. Calf veins were not well visualized on the right due to patient positioning and edema.  Verbal to [employer].  Conclusions    Summary    Acute totally occluding deep vein thrombosis involving the right proximal  PTV and one gastroc vein zone 5-6.  Acute partially occluding deep vein thrombosis involving the popliteal vein  and right one gastroc vein zone 5-6.  Calf veins were not well visualized on the right due to patient positioning  and edema.    Signature    ------------------------------------------------------------------  Electronically signed by Moses Manners, MD, [facility], RPVI  (Interpreting physician) on 07/10/2014 at 11:34 AM  ------------------------------------------------------------------   Patient Status:Routine. Study Location:[facility] - Vascular Lab. Technical Quality:Poor visualization.  Risk Factors History +------------------+----------+----------------------------------------------+ !Diagnosis         !Date      !Comments                                      ! +------------------+----------+----------------------------------------------+ !Previous Procedure!06/27/2014!Venous Doppler Bilateral: Acute DVT right     ! !                  !          !gastroc veins.                                ! +------------------+----------+----------------------------------------------+  Velocities are measured in cm/s ; Diameters are measured in mm  Right Lower Extremities DVT Study Measurements Right 2D Measurements +------------------------+----------+---------------+----------+ !Location                !Visualized!Compressibility!Thrombosis! +------------------------+----------+---------------+----------+ !Sapheno Femoral Junction!Yes       !Yes            !None      ! +------------------------+----------+---------------+----------+ !GSV Thigh               !Yes       !Yes            !None      ! +------------------------+----------+---------------+----------+ !Common Femoral          !Yes       !Yes            !None      ! +------------------------+----------+---------------+----------+ !Prox  Femoral            !Yes       !Yes            !None      ! +------------------------+----------+---------------+----------+ !Mid Femoral             !Yes       !Yes            !None      ! +------------------------+----------+---------------+----------+ !Dist Femoral            !Yes       !Yes            !None      ! +------------------------+----------+---------------+----------+ !Deep Femoral            !Yes       !Yes            !None      ! +------------------------+----------+---------------+----------+ !Popliteal               !  Yes       !Partial        !Acute     ! +------------------------+----------+---------------+----------+ !GSV Below Knee          !Yes       !Yes            !None      ! +------------------------+----------+---------------+----------+ !Gastroc                 !Yes       !Partial        !Acute     ! +------------------------+----------+---------------+----------+ !PTV                     !Yes       !No             !Acute     ! +------------------------+----------+---------------+----------+ !Peroneal                !Yes       !Yes            !None      ! +------------------------+----------+---------------+----------+ !SSV                     !Yes       !Yes            !None      ! +------------------------+----------+---------------+----------+  Right Doppler Measurements +--------------+------+------+------------+ !Location      !Signal!Reflux!Reflux (sec)! +--------------+------+------+------------+ !Common Femoral!Phasic!      !            ! +--------------+------+------+------------+ !Femoral       !Phasic!      !            ! +--------------+------+------+------------+ !Deep Femoral  !Phasic!      !            ! +--------------+------+------+------------+ !Popliteal     !Phasic!      !            ! +--------------+------+------+------------+  Left Lower Extremities DVT Study Measurements Left 2D Measurements +------------------------+----------+---------------+----------+ !Location                 !Visualized!Compressibility!Thrombosis! +------------------------+----------+---------------+----------+ !Sapheno Femoral Junction!Yes       !Yes            !None      ! +------------------------+----------+---------------+----------+ !Common Femoral          !Yes       !Yes            !None      ! +------------------------+----------+---------------+----------+  Left Doppler Measurements +--------------+------+------+------------+ !Location      !Signal!Reflux!Reflux (sec)! +--------------+------+------+------------+ !Common Femoral!Phasic!      !            ! +--------------+------+------+------------+    Vl Extremity Venous Bilateral    07/09/2014   Vascular Lower Extremities DVT Study Procedure  -- PRELIMINARY SONOGRAPHER REPORT --    Demographics    Patient Name      TERRALL BLEY    Date of Study     07/09/2014          Gender              Male    Patient Number    [id number]          Date of Birth       02/02/1941    Visit Number      [id number]  Age                 25 year(s)    Accession Number  [id number]           Room Number         5732    Corporate ID      [id number]            Sonographer         Erasmo Leventhal,                                                            RVS    Ordering          Deniece Portela,   Interpreting        [facility]  Physician         MD                  Physician   Procedure  Type of Study:    Veins:Lower Extremities DVT Study, VL EXTREMITY VENOUS DUPLEX RIGHT.   Tech Comments Right Acute totally occluding deep vein thrombosis involving the right proximal PTV and one gastroc vein zone 5-6. Acute partially occluding deep vein thrombosis involving the popliteal vein and right one gastroc vein zone 5-6. No other evidence of deep vein or superficial vein thrombosis involving the right lower extremity and the left common femoral vein. Calf veins were not well visualized on the right due to patient positioning and edema.  verbal to [employer].      Assessment and Plan:  Patient Active Hospital Problem List:   Pneumonia (07/25/2014)    Assessment: better    Plan: cont abx   Benign essential HTN (07/03/2014)    Assessment: Stable    Plan: Continue present orders/plan   Hepatic encephalopathy ([place]) (07/07/2014)    Assessment: more alert today    Plan: Continue present orders/plan   DVT (deep venous thrombosis) ([place]) (07/10/2014)    Assessment: Stable    Plan: Continue present orders/plan   Traumatic hematoma of buttock ()    Assessment: counts still dropping    Plan: cont to monitor. May need transfusion              Deniece Portela  08/03/2014

## 2014-08-03 NOTE — Progress Notes (Signed)
Pt voided and had a med loose brown BM per BR Assisted back to bed per Pt's request Daughter visiting at bedside Call light with in reach

## 2014-08-03 NOTE — Progress Notes (Signed)
Assessment complete VSS and afebrile Wife at bedside Call light with in reach

## 2014-08-03 NOTE — Progress Notes (Signed)
Pt assisted up to chair Wife present in room Assisting Pt with breakfast

## 2014-08-03 NOTE — Oncology Nurse Navigation (Signed)
Oncology and Hematology Care   Progress Note    08/03/2014    SUBJECTIVE: wife feels that patient is improved. He has been ambulating to bathroom. He is not very awake this morning. There is no definite clinical evidence that hematoma is worsening. He has had ivc filter placed without problem Anticoagulation therapy has been stopped    OBJECTIVE:    Physical Assessment:  Vitals:  BP 112/69 mmHg   Pulse 80   Temp(Src) 98.2 ??F (36.8 ??C) (Temporal)   Resp 20   Ht 5\' 5"  (1.651 m)   Wt 193 lb 8 oz (87.771 kg)   BMI 32.20 kg/m2   SpO2 95%   24HR INTAKE/OUTPUT:    Intake/Output Summary (Last 24 hours) at 08/03/14 0851  Last data filed at 08/02/14 2128   Gross per 24 hour   Intake    370 ml   Output      0 ml   Net    370 ml       CONSTITUTIONAL:   Awakens but drowsy  HEENT: PERRL, Neck: soft, supple, no cervical, supraclavicular or axillary adenopathy  RESPIRATORY:  No increased work of breathing, breath sounds decreased.  CARDIOVASCULAR:  Cardiac S1/S2, RRR  GASTROINTESTINAL:  abdomen soft +BS x4, no hepatosplenomegaly  SKIN:  negative for rash and skin lesion(s)  MUSCULOSKELETAL:  negative for pain and muscle weakness  EXTREMITIES: Negative for Lower extremity edema    Labs Results:    CBC: Recent Labs      08/01/14   0551  08/02/14   0536  08/03/14   0621   WBC  2.4*  2.3*  1.9*   HGB  6.9*  8.4*  7.6*   HCT  20.9*  25.4*  22.8*   MCV  102.4*  98.7  99.5   PLT  42*  57*  50*     BMP: Recent Labs      08/01/14   0551  08/02/14   0537  08/03/14   0622   NA  136  136  135*   K  5.4*  4.9  4.8   CL  109  108  106   CO2  23  22  23    BUN  29*  23*  21*   CREATININE  0.8  0.7*  0.6*     LIVER PROFILE: No results for input(s): AST, ALT, LIPASE, BILIDIR, BILITOT, ALKPHOS in the last 72 hours.    Invalid input(s):  AMYLASE,  ALB  PT/INR:    Lab Results   Component Value Date    PROTIME 15.2 08/01/2014    PROTIME 15.7 07/25/2014    PROTIME 14.7 07/07/2014    INR 1.33 08/01/2014    INR 1.37 07/25/2014    INR 1.28 07/07/2014     INR 1.17 05/14/2010    INR 1.18 02/13/2010    INR 1.15 10/17/2009     PTT:    Lab Results   Component Value Date    APTT 33.5 08/01/2014    APTT 46.5 07/25/2014    APTT 29.7 10/17/2009     UA:No results for input(s): NITRITE, COLORU, PHUR, LABCAST, WBCUA, RBCUA, MUCUS, TRICHOMONAS, YEAST, BACTERIA, CLARITYU, SPECGRAV, LEUKOCYTESUR, UROBILINOGEN, BILIRUBINUR, BLOODU, GLUCOSEU, AMORPHOUS in the last 72 hours.    Invalid input(s): KETONESU  Magnesium:   Lab Results   Component Value Date    MG 1.50 01/14/2014    MG 1.70 01/10/2014    MG 2.0 11/19/2011     ANA:  Lab Results   Component Value Date    ANATITER Negative at <1-80 10/17/2009    ANA POSITIVE at 1-40 10/17/2009       RADIOLOGY:    Xr Chest Standard Two Vw    07/27/2014   EXAMINATION: TWO VIEWS OF THE CHEST  07/27/2014 5:52 pm  COMPARISON: 07/25/2014  HISTORY: ORDERING SYSTEM PROVIDED HISTORY: follow up pneumonia TECHNOLOGIST PROVIDED HISTORY: Ordering Physician Provided Reason for Exam: follow up pneumonia Acuity: Acute Type of Exam: Initial  FINDINGS: Compression fracture of the lower thoracic spine is again seen.  Minimal right base atelectasis is noted.  There is scarring on the left.  Remote rib fractures are seen.  Cardiac silhouette within normal limits.  There is a hiatal hernia     07/27/2014   IMPRESSION: No acute disease     Xr Chest Standard Two Vw    07/25/2014   EXAMINATION: TWO VIEWS OF THE CHEST  07/25/2014 8:17 am  COMPARISON: Chest x-ray Jul 07, 2014  HISTORY: ORDERING SYSTEM PROVIDED HISTORY: generalized weakness, hypotension TECHNOLOGIST PROVIDED HISTORY: Ordering Physician Provided Reason for Exam: weakness Acuity: Unknown Type of Exam: Unknown  FINDINGS: There is new focal density of the right upper lobe above the hilum.  There also is new faint airspace disease of the lateral right lung base.  There is new focal opacity of the retrocardiac left lower lobe.  There is blunting of the left costophrenic angle consistent with pleural fluid.      07/25/2014   IMPRESSION: New bilateral multifocal airspace disease, most likely atelectasis or pneumonia although follow-up to ensure resolution is recommended.  New small to moderate size left pleural effusion.     Ct Head Wo Contrast    07/07/2014   EXAMINATION: CT OF THE HEAD WITHOUT CONTRAST  07/07/2014 11:11 pm  TECHNIQUE: CT of the head was performed without the administration of intravenous contrast.  COMPARISON: 04/01/2014  HISTORY: ORDERING SYSTEM PROVIDED HISTORY: dizzy TECHNOLOGIST PROVIDED HISTORY: Ordering Physician Provided Reason for Exam: dizzy  FINDINGS: BRAIN/VENTRICLES: There is no acute intracranial hemorrhage, mass effect or midline shift.  No abnormal extra-axial fluid collection.  The gray-white differentiation is maintained without evidence of an acute infarct.  There is no evidence of hydrocephalus.  ORBITS: The visualized portion of the orbits demonstrate no acute abnormality.  SINUSES: There is minimal mucosal thickening in the left maxillary sinus. The remaining visualized paranasal sinuses are clear.  The mastoid air cells are unremarkable.  SOFT TISSUES/SKULL:  No acute abnormality of the visualized skull or soft tissues.     07/07/2014   IMPRESSION: No acute intracranial abnormality.     Ct Chest W Contrast    07/26/2014   EXAMINATION: CT OF THE CHEST WITH CONTRAST 07/26/2014 2:14 pm  TECHNIQUE: CT of the chest was performed with the administration of intravenous contrast. Multiplanar reformatted images are provided for review. Dose modulation, iterative reconstruction, and/or weight based adjustment of the mA/kV was utilized to reduce the radiation dose to as low as reasonably achievable.  COMPARISON: 06/27/2014  HISTORY: ORDERING SYSTEM PROVIDED HISTORY: SHORTNESS OF BREATH  FINDINGS: Mediastinum: Stable mediastinal lymph nodes, no convincing adenopathy. Thoracic aorta normal in caliber.  No pericardial effusion.  .  Lungs/pleura: Interval decrease in right pleural effusion.  Stable trace  left pleural effusion.  There are multifocal airspace opacities that are predominantly bandlike in morphology, mostly in the lower lobes, right middle lobe, and lingula.  There are a cluster of tiny nodules in the lateral right upper lobe.  There is a stable 1.3 cm nodule in the superior segment of the right lower lobe.  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  There is a tiny nonobstructing right renal stone.  Large venous collaterals are noted adjacent to the spleen  Soft Tissues/Bones: There is bilateral gynecomastia.  There are multiple thyroid nodules     07/26/2014   IMPRESSION: 1. Multifocal airspace opacities in the lower lobes, right middle lobe, and lingula have an appearance compatible with atelectasis, with or without pneumonia.  Cluster of nodular densities in the lateral right upper lobe most likely represents atypical infection.  1.3 cm nodule in the superior segment of the right lower lobe warrants six-month follow-up 2. Interval decrease in right pleural effusion 3. 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  LIVER: Evaluation of the liver is limited.  There is diffuse increased echogenicity with no focal lesion noted.  Somewhat nodular contour of the liver is compatible with known cirrhosis.  BILIARY SYSTEM: Limited imaging of the gallbladder is grossly unremarkable.  Common bile duct is within normal limits measuring 6 mm.  KIDNEYS: Right kidney is nonvisualized.  Left kidney measures 10.1 x 4.9 x 5.3 cm and is grossly unremarkable.  PANCREAS: Pancreas is nonvisualized.  SPLEEN: Spleen  measures 10.1 cm.  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  IVC: The IVC is patent.  AORTA: Aorta is obscured.  OTHER: No evidence of ascites.     07/08/2014   IMPRESSION: Limited study as above.  Cirrhosis and portal hypertension.  No evidence of ascites.     Ct Abdomen Pelvis W Iv Contrast Additional Contrast?: Oral    07/30/2014   EXAMINATION: CT OF THE ABDOMEN AND PELVIS WITH CONTRAST 07/30/2014 11:54 am  TECHNIQUE: CT of the abdomen and pelvis was performed with the administration of intravenous contrast. Multiplanar reformatted images are provided for review. Dose modulation, iterative reconstruction, and/or weight based adjustment of the mA/kV was utilized to reduce the radiation dose to as low as reasonably achievable.  COMPARISON: 09/07/2010  HISTORY: ORDERING SYSTEM PROVIDED HISTORY: ABD PAIN, FEVER, NO RECENT SURGERY TECHNOLOGIST PROVIDED HISTORY: Additional Contrast?->Oral Ordering Physician Provided Reason for Exam: ABD PAIN, FEVER, NO RECENT SURGERY Acuity: Unknown Type of Exam: Unknown  Unexplained anemia.  Evaluate for retroperitoneal hemorrhage.  FINDINGS: Lower Chest: There is mild bibasilar pleural and parenchymal disease, new from the prior study.  Organs: The liver is shrunken with a nodular contour consistent with cirrhosis.  No focal liver lesion.  The main portal vein is small in size and grossly patent.  There is a recannulized umbilical vein.  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  GI/Bowel: Bowel loops are unremarkable.  The gallbladder is moderately distended.  There are no calcified gallstones.  Pelvis: Urinary bladder, prostate gland and seminal vesicles are unremarkable.  Peritoneum/Retroperitoneum: No adenopathy, free air or free fluid.  Bones/Soft Tissues: There is bilateral flank edema.  There is a large left gluteus muscle mass/hematoma extending into the left upper thigh.     07/30/2014   IMPRESSION: Large left gluteus muscle mass/hematoma likely accounting for the  unexplained anemia.  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     Xr Chest Portable    07/08/2014   EXAMINATION: SINGLE VIEW OF THE CHEST  07/07/2014 11:34 pm  COMPARISON: 06/03/2014  HISTORY: ORDERING SYSTEM PROVIDED HISTORY: other TECHNOLOGIST PROVIDED HISTORY: Ordering Physician Provided Reason for Exam: dizziness Acuity: Unknown Type of Exam: Unknown  FINDINGS: Cardiac leads project over the chest.  Diffuse interstitial opacity is demonstrated bilaterally.  Heterogeneous opacity is seen adjacent to the left heart border.  No evidence of pleural effusion.  Negative for pneumothorax. Cardiac and mediastinal silhouettes are similar to prior.  Old posterior left 7th rib fracture.     07/08/2014   IMPRESSION: Mild pulmonary edema.     Vl Extremity Venous Right    07/10/2014   Lower Extremities DVT Study   Demographics    Patient Name      Andrew [name]    Date of Study     07/09/2014          Gender              Male    Patient Number    [id number]          Date of Birth       30-Jun-1940    Visit Number      [id number]         Age                 74 year(s)    Accession Number  [id number]           Room Number         1517    Corporate ID      [id number]            Sonographer         Erasmo Leventhal,                                                            RVS    Ordering          Deniece Portela,   Interpreting        [facility]  Physician         MD                  Physician           Peggyann Shoals,                                                            MD, [facility] [place]   Procedure  Type of Study:    Veins:Lower Extremities DVT Study, VL EXTREMITY VENOUS DUPLEX RIGHT.    Generic Orders:VL EXTREMITY VENOUS DUPLEX RIGHT.    Vascular Sonographer Report   Additional Indications:Swelling  Impressions Right Impression Acute totally occluding deep vein thrombosis involving the right proximal PTV and one gastroc vein zone 5-6. Acute partially occluding deep vein thrombosis involving the  popliteal vein and right one gastroc vein zone 5-6. No other evidence of deep vein or superficial vein thrombosis involving the right lower extremity and the left common femoral vein.  Calf veins were not well visualized on the right due to patient positioning and edema.  Verbal to [employer].  Conclusions    Summary    Acute totally occluding deep vein thrombosis involving the right proximal  PTV and one gastroc vein zone 5-6.  Acute partially occluding deep vein thrombosis involving the popliteal vein  and right one gastroc vein zone 5-6.  Calf veins were not well visualized on the right due to patient positioning  and edema.    Signature    ------------------------------------------------------------------  Electronically signed by Peggyann Shoals, MD, [facility], R[facility]  (Interpreting physician) on 07/10/2014 at 11:34 AM  ------------------------------------------------------------------   Patient Status:Routine. [facility]. Technical Quality:Poor visualization.  Risk Factors History +------------------+----------+----------------------------------------------+ !Diagnosis         !Date      !Comments                                      ! +------------------+----------+----------------------------------------------+ !Previous Procedure!06/27/2014!Venous Doppler Bilateral: Acute DVT right     ! !                  !          !gastroc veins.                                ! +------------------+----------+----------------------------------------------+  Velocities are measured in cm/s ; Diameters are measured in mm  Right Lower Extremities DVT Study Measurements Right 2D Measurements +------------------------+----------+---------------+----------+ !Location                !Visualized!Compressibility!Thrombosis! +------------------------+----------+---------------+----------+ !Sapheno Femoral Junction!Yes       !Yes            !None      !  +------------------------+----------+---------------+----------+ !GSV Thigh               !Yes       !Yes            !None      ! +------------------------+----------+---------------+----------+ !Common Femoral          !Yes       !Yes            !None      ! +------------------------+----------+---------------+----------+ !Prox Femoral            !Yes       !Yes            !None      ! +------------------------+----------+---------------+----------+ !Mid Femoral             !Yes       !Yes            !None      ! +------------------------+----------+---------------+----------+ !Dist Femoral            !Yes       !Yes            !None      ! +------------------------+----------+---------------+----------+ !Deep Femoral            !Yes       !Yes            !None      ! +------------------------+----------+---------------+----------+ !Popliteal               !Yes       !Partial        !  Acute     ! +------------------------+----------+---------------+----------+ !GSV Below Knee          !Yes       !Yes            !None      ! +------------------------+----------+---------------+----------+ !Gastroc                 !Yes       !Partial        !Acute     ! +------------------------+----------+---------------+----------+ !PTV                     !Yes       !No             !Acute     ! +------------------------+----------+---------------+----------+ !Peroneal                !Yes       !Yes            !None      ! +------------------------+----------+---------------+----------+ !SSV                     !Yes       !Yes            !None      ! +------------------------+----------+---------------+----------+  Right Doppler Measurements +--------------+------+------+------------+ !Location      !Signal!Reflux!Reflux (sec)! +--------------+------+------+------------+ !Common Femoral!Phasic!      !            ! +--------------+------+------+------------+ !Femoral       !Phasic!      !            !  +--------------+------+------+------------+ !Deep Femoral  !Phasic!      !            ! +--------------+------+------+------------+ !Popliteal     !Phasic!      !            ! +--------------+------+------+------------+  Left Lower Extremities DVT Study Measurements Left 2D Measurements +------------------------+----------+---------------+----------+ !Location                !Visualized!Compressibility!Thrombosis! +------------------------+----------+---------------+----------+ !Sapheno Femoral Junction!Yes       !Yes            !None      ! +------------------------+----------+---------------+----------+ !Common Femoral          !Yes       !Yes            !None      ! +------------------------+----------+---------------+----------+  Left Doppler Measurements +--------------+------+------+------------+ !Location      !Signal!Reflux!Reflux (sec)! +--------------+------+------+------------+ !Common Femoral!Phasic!      !            ! +--------------+------+------+------------+    Vl Extremity Venous Bilateral    07/09/2014   Vascular Lower Extremities DVT Study Procedure  -- PRELIMINARY SONOGRAPHER REPORT --    Demographics    Patient Name      Andrew [name]    Date of Study     07/09/2014          Gender              Male    Patient Number    [id number]          Date of Birth       Apr 05, 1940    Visit Number      [id number]         Age  73 year(s)    Accession Number  [id number]           Room Number         4098    Corporate ID      [id number]            Sonographer         Erasmo Leventhal,                                                            RVS    Ordering          Deniece Portela,   Interpreting        [facility]  Physician         MD                  Physician   Procedure  Type of Study:    Veins:Lower Extremities DVT Study, VL EXTREMITY VENOUS DUPLEX RIGHT.   Tech Comments Right Acute totally occluding deep vein thrombosis involving the right proximal PTV and one gastroc vein zone 5-6.  Acute partially occluding deep vein thrombosis involving the popliteal vein and right one gastroc vein zone 5-6. No other evidence of deep vein or superficial vein thrombosis involving the right lower extremity and the left common femoral vein. Calf veins were not well visualized on the right due to patient positioning and edema.  verbal to [employer].      Current Medications  ??? sodium chloride  250 mL Intravenous Once   ??? sodium chloride flush  10 mL Intravenous 2 times per day   ??? sodium chloride  250 mL Intravenous Once   ??? lactulose  20 g Oral TID   ??? amoxicillin-clavulanate  1 tablet Oral 2 times per day   ??? carvedilol  6.25 mg Oral BID WC   ??? vitamin D  1,000 Units Oral Daily   ??? citalopram  10 mg Oral Daily   ??? eplerenone  50 mg Oral Daily   ??? furosemide  40 mg Oral Daily   ??? lisinopril  5 mg Oral Nightly   ??? magnesium oxide  400 mg Oral Daily   ??? rifaximin  550 mg Oral BID   ??? 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 Thank you for allowing [place] to participate in this patients care.    Darlyn Read, MD, 08/03/2014, 8:51 AM

## 2014-08-04 ENCOUNTER — Inpatient Hospital Stay: Admit: 2014-08-04 | Payer: MEDICARE | Primary: Internal Medicine

## 2014-08-04 LAB — CBC WITH AUTO DIFFERENTIAL
Bands Relative: 3 % (ref 0–7)
Basophils %: 0 %
Basophils Absolute: 0 10*3/uL (ref 0.0–0.2)
Eosinophils %: 2 %
Eosinophils Absolute: 0 10*3/uL (ref 0.0–0.6)
Hematocrit: 22.9 % — ABNORMAL LOW (ref 40.5–52.5)
Hemoglobin: 7.5 g/dL — ABNORMAL LOW (ref 13.5–17.5)
Lymphocytes %: 49 %
Lymphocytes Absolute: 1 10*3/uL (ref 1.0–5.1)
MCH: 32.9 pg (ref 26.0–34.0)
MCHC: 32.6 g/dL (ref 31.0–36.0)
MCV: 100.9 fL — ABNORMAL HIGH (ref 80.0–100.0)
MPV: 9.3 fL (ref 5.0–10.5)
Monocytes %: 8 %
Monocytes Absolute: 0.2 10*3/uL (ref 0.0–1.3)
Neutrophils %: 38 %
Neutrophils Absolute: 0.9 10*3/uL — CL (ref 1.7–7.7)
PLATELET SLIDE REVIEW: DECREASED
Platelets: 56 10*3/uL — ABNORMAL LOW (ref 135–450)
RBC: 2.27 M/uL — ABNORMAL LOW (ref 4.20–5.90)
RDW: 16.2 % — ABNORMAL HIGH (ref 12.4–15.4)
WBC: 2.1 10*3/uL — ABNORMAL LOW (ref 4.0–11.0)

## 2014-08-04 LAB — BASIC METABOLIC PANEL
Anion Gap: 5 (ref 3–16)
BUN: 19 mg/dL (ref 7–20)
CO2: 22 mmol/L (ref 21–32)
Calcium: 8.6 mg/dL (ref 8.3–10.6)
Chloride: 108 mmol/L (ref 99–110)
Creatinine: 0.7 mg/dL — ABNORMAL LOW (ref 0.8–1.3)
GFR African American: >60 (ref >60)
GFR Non-African American: >60 (ref >60)
Glucose: 114 mg/dL — ABNORMAL HIGH (ref 70–99)
Potassium: 5.1 mmol/L (ref 3.5–5.1)
Sodium: 135 mmol/L — ABNORMAL LOW (ref 136–145)

## 2014-08-04 MED FILL — CITALOPRAM HYDROBROMIDE 20 MG PO TABS: 20 MG | ORAL | Qty: 1

## 2014-08-04 MED FILL — AMOXICILLIN-POT CLAVULANATE 875-125 MG PO TABS: 875-125 MG | ORAL | Qty: 1

## 2014-08-04 MED FILL — XIFAXAN 550 MG PO TABS: 550 MG | ORAL | Qty: 1

## 2014-08-04 MED FILL — MAGNESIUM OXIDE 400 (241.3 MG) MG PO TABS: 400 (241.3 Mg) MG | ORAL | Qty: 1

## 2014-08-04 MED FILL — CARVEDILOL 6.25 MG PO TABS: 6.25 MG | ORAL | Qty: 1

## 2014-08-04 MED FILL — NORMAL SALINE FLUSH 0.9 % IV SOLN: 0.9 % | INTRAVENOUS | Qty: 10

## 2014-08-04 MED FILL — VITAMIN D3 25 MCG (1000 UT) PO TABS: 25 MCG (1000 UT) | ORAL | Qty: 1

## 2014-08-04 MED FILL — FUROSEMIDE 40 MG PO TABS: 40 MG | ORAL | Qty: 1

## 2014-08-04 MED FILL — ACETAMINOPHEN 325 MG PO TABS: 325 MG | ORAL | Qty: 2

## 2014-08-04 MED FILL — EPLERENONE 25 MG PO TABS: 25 MG | ORAL | Qty: 2

## 2014-08-04 MED FILL — LISINOPRIL 5 MG PO TABS: 5 MG | ORAL | Qty: 1

## 2014-08-04 MED FILL — LACTULOSE 10 GM/15ML PO SOLN: 10 GM/15ML | ORAL | Qty: 30

## 2014-08-04 NOTE — Progress Notes (Signed)
Pt assessment completed, see flowsheet. Pt A/O, VSS, afebrile. Updated on POC. Wife is at bedside. Denies any pain or discomfort at this time. Call light within reach. Bed alarm is engaged. Will continue to monitor.

## 2014-08-04 NOTE — Progress Notes (Signed)
Oncology and Hematology Care   Progress Note    Subjective:  Drowsy, arousable but really not answering questions.    Objective:  Medications:  ??? sodium chloride  250 mL Intravenous Once   ??? sodium chloride flush  10 mL Intravenous 2 times per day   ??? sodium chloride  250 mL Intravenous Once   ??? lactulose  20 g Oral TID   ??? amoxicillin-clavulanate  1 tablet Oral 2 times per day   ??? carvedilol  6.25 mg Oral BID WC   ??? vitamin D  1,000 Units Oral Daily   ??? citalopram  10 mg Oral Daily   ??? eplerenone  50 mg Oral Daily   ??? furosemide  40 mg Oral Daily   ??? lisinopril  5 mg Oral Nightly   ??? magnesium oxide  400 mg Oral Daily   ??? rifaximin  550 mg Oral BID   ??? sodium chloride flush  10 mL Intravenous 2 times per day        Physical Exam:  Vitals:  BP 100/61 mmHg   Pulse 71   Temp(Src) 98.4 ??F (36.9 ??C) (Temporal)   Resp 20   Ht 5\' 5"  (1.651 m)   Wt 194 lb 1.6 oz (88.043 kg)   BMI 32.30 kg/m2   SpO2 96%    General:  Awakens, drowsy, chronically-ill appearing  HEENT: PERRL, no icterus  Lymph: No cervical, supraclavicular LAD  Resp: Rhonchi in B/L lower lung fields, tachypnea  Cardiovascular: RRR, no MRG  GI: Soft, mildly distended, +BS  Skin: No rash, no jaundice  Psych: + confusion, no agitation    Labs Results:    CBC:   Recent Labs      08/02/14   0536  08/03/14   0621  08/04/14   0619   WBC  2.3*  1.9*  2.1*   HGB  8.4*  7.6*  7.5*   HCT  25.4*  22.8*  22.9*   MCV  98.7  99.5  100.9*   PLT  57*  50*  56*     BMP:   Recent Labs      08/02/14   0537  08/03/14   0622  08/04/14   0619   NA  136  135*  135*   K  4.9  4.8  5.1   CL  108  106  108   CO2  22  23  22    BUN  23*  21*  19   CREATININE  0.7*  0.6*  0.7*     LIVER PROFILE: No results for input(s): AST, ALT, LIPASE, BILIDIR, BILITOT, ALKPHOS in the last 72 hours.    Invalid input(s):  AMYLASE,  ALB  PT/INR:    Lab Results   Component Value Date    PROTIME 15.2 08/01/2014    PROTIME 15.7 07/25/2014    PROTIME 14.7 07/07/2014    INR 1.33 08/01/2014    INR 1.37  07/25/2014    INR 1.28 07/07/2014    INR 1.17 05/14/2010    INR 1.18 02/13/2010    INR 1.15 10/17/2009     PTT:    Lab Results   Component Value Date    APTT 33.5 08/01/2014    APTT 46.5 07/25/2014    APTT 29.7 10/17/2009       Investigations Reviewed:    CXR:     In this patient with cardiomegaly, new right pleural effusion and   ground-glass attenuation in the right lung  base may represent developing   pulmonary edema. Atelectasis or developing pneumonia cannot be excluded.       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 Hgb stable today, cont to observe.  ESA yesterday.    DVT - IVC filter.     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  Rifaximin, lactulose.    I have discussed the above stated plan with the patient and they verbalized understanding and agreed with the plan. Thank you for allowing [place] to participate in this patients care.    Janus Molder, MD  Hematology/Oncology  [phone]

## 2014-08-04 NOTE — Plan of Care (Signed)
Problem: PROTECTIVE PRECAUTIONS  Goal: Patient will remain free of nosocomial Infections  0730 Informed by Jacqlyn Larsen RN of ANC 0.9. Called central distribution for isolation cart, explained protective isolation to wife during bedside hand off, the patient sleeping @ this time. the patient's wife verbalized understanding. Note on chart for doctor.  0900 informed Dr Tasia Catchings of [place] < 1000 & neutropenic isolation started, dr verbalized understanding. Fidela Salisbury RN, BSN, PCCN.

## 2014-08-04 NOTE — Plan of Care (Signed)
Problem: Breathing Pattern - Ineffective:  Goal: Ability to achieve and maintain a regular respiratory rate will improve  Ability to achieve and maintain a regular respiratory rate will improve   Assessment complete, see flow sheet.  BP 99/66 mmHg   Pulse 96   Temp(Src) 98.7 ??F (37.1 ??C) (Temporal)   Resp 20   Ht 5\' 5"  (1.651 m)   Wt 194 lb 1.6 oz (88.043 kg)   BMI 32.30 kg/m2   SpO2 96% oxygen 2 lpm, Pulse oximetry on room air was 88%. Lung sounds diminished in all lobes with rales in all right lobes and left posterior base.  Discussed plan of care regarding turn, cough & deep breath every 2 hours while awake. Patient Verbalized understanding, turned coughed & deep breathed with poor effort & no change in lungs.  Informed the patient & wife of chest X-ray order, both verbalized understanding. Will continue to monitor. Fidela Salisbury RN, BSN, PCCN.

## 2014-08-04 NOTE — Plan of Care (Signed)
Problem: Breathing Pattern - Ineffective:  Goal: Ability to achieve and maintain a regular respiratory rate will improve  Ability to achieve and maintain a regular respiratory rate will improve   1315 back from chest X-ray per bed. the patient tolerated test well. Denies pain, no complaints voiced.  Took deep breaths with fair effort. Will continue to monitor. Fidela Salisbury RN, BSN, PCCN.

## 2014-08-04 NOTE — Plan of Care (Signed)
Problem: Activity:  Goal: Ability to tolerate increased activity will improve  Ability to tolerate increased activity will improve   Incontinent urine. Called out for bathroom. To bathroom, given peri care & back to bed with maximum assistance of 2, the patient tolerated ambulation fair. Denies pain, no complaints voiced.  Will continue to monitor. Fidela Salisbury RN, BSN, PCCN.

## 2014-08-04 NOTE — Progress Notes (Signed)
Progress Note - Dr. Tasia Catchings - Internal Medicine  PCP: Alecia Lemming, DO [place] / [place] [address] [phone]    Hospital Day: 10  Code Status: Full Code  Current Diet: DIET GENERAL; No Added Salt (3-4 GM)        CC: follow up on medical issues    Subjective:   Andrew [name] is a 74 y.o. male.    He denies problems    Unchanged  Pt is drowsy in bed but can be easily aroused  Blood counts stable    He denies chest pain, denies shortness of breath, denies nausea,  denies emesis.    10 system Review of Systems is reviewed with patient, and pertinent positives are listed here: None . Otherwise, Review of systems is negative.     I have reviewed the patient's medical and social history in detail and updated the computerized patient record.  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 He  has past surgical history that includes AV fistula repair (1970's); Colonoscopy (4/12); Cardiac catheterization; eye surgery (04/12/2014); Cosmetic surgery (04/2013); bone marrow biopsy; and knee surgery.[name] He  reports that he has never smoked. He has never used smokeless tobacco. He reports that he does not drink alcohol or use illicit drugs..        Active Hospital Problems    Diagnosis Date Noted   ??? Hematoma [T14.8] 07/31/2014   ??? Pneumonia of both lungs due to infectious organism [J18.9]    ??? Traumatic hematoma of buttock [S30.0XXA]    ??? Pneumonia [J18.9] 07/25/2014   ??? 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 DVT (deep venous  thrombosis) (HCC) [I82.409] 07/10/2014   ??? Hepatic encephalopathy (HCC) [K72.90] 07/07/2014   ??? Benign essential HTN [I10] 07/03/2014       Current facility-administered medications: 0.9 % sodium chloride bolus, 250 mL, Intravenous, Once  sodium chloride flush 0.9 % injection 10 mL, 10 mL, Intravenous, 2 times per day  sodium chloride flush 0.9 % injection 10 mL, 10 mL, Intravenous, PRN  acetaminophen (TYLENOL) tablet 650 mg, 650 mg, Oral, Q4H PRN  magnesium hydroxide (MILK OF MAGNESIA) 400 MG/5ML suspension 30 mL, 30 mL, Oral, Daily PRN  ondansetron (ZOFRAN) injection 4 mg, 4 mg, Intravenous, Q6H PRN  0.9 % sodium chloride bolus, 250 mL, Intravenous, Once  lactulose (CHRONULAC) 10 GM/15ML solution 20 g, 20 g, Oral, TID  amoxicillin-clavulanate (AUGMENTIN) 875-125 MG per tablet 1 tablet, 1 tablet, Oral, 2 times per day  ioversol (OPTIRAY) 68 % injection 100 mL, 100 mL, Intravenous, ONCE PRN  sodium chloride (PF) 0.9 % injection 10 mL, 10 mL, Intravenous, PRN  carvedilol (COREG) tablet 6.25 mg, 6.25 mg, Oral, BID WC  vitamin D (CHOLECALCIFEROL) tablet 1,000 Units, 1,000 Units, Oral, Daily  citalopram (CELEXA) tablet 10 mg, 10 mg, Oral, Daily  eplerenone (INSPRA) tablet 50 mg, 50 mg, Oral, Daily  furosemide (LASIX) tablet 40 mg, 40 mg, Oral, Daily  lisinopril (PRINIVIL;ZESTRIL) tablet 5 mg, 5 mg, Oral, Nightly  magnesium oxide (MAG-OX) tablet 400 mg, 400 mg, Oral, Daily  rifaximin (XIFAXAN) tablet 550 mg, 550 mg, Oral, BID  traMADol (ULTRAM) tablet 50 mg,  50 mg, Oral, Q12H PRN  sodium chloride flush 0.9 % injection 10 mL, 10 mL, Intravenous, 2 times per day  sodium chloride flush 0.9 % injection 10 mL, 10 mL, Intravenous, PRN  acetaminophen (TYLENOL) tablet 650 mg, 650 mg, Oral, Q4H PRN  HYDROcodone-acetaminophen (NORCO) 5-325 MG per tablet 1 tablet, 1 tablet, Oral, Q4H PRN **OR** HYDROcodone-acetaminophen (NORCO) 5-325 MG per tablet 2 tablet, 2 tablet, Oral, Q4H PRN  morphine (PF) injection 2 mg, 2 mg, Intravenous,  Q2H PRN **OR** morphine (PF) injection 4 mg, 4 mg, Intravenous, Q2H PRN  magnesium hydroxide (MILK OF MAGNESIA) 400 MG/5ML suspension 30 mL, 30 mL, Oral, Daily PRN  ondansetron (ZOFRAN) injection 4 mg, 4 mg, Intravenous, Q6H PRN  potassium chloride SA (K-DUR;KLOR-CON M) tablet 40 mEq, 40 mEq, Oral, PRN **OR** potassium chloride 20 MEQ/15ML (10%) oral solution 40 mEq, 40 mEq, Oral, PRN **OR** potassium chloride 10 mEq/100 mL IVPB (Peripheral Line), 10 mEq, Intravenous, PRN         Objective:  BP 107/70 mmHg   Pulse 94   Temp(Src) 98.8 ??F (37.1 ??C) (Temporal)   Resp 16   Ht 5\' 5"  (1.651 m)   Wt 194 lb 1.6 oz (88.043 kg)   BMI 32.30 kg/m2   SpO2 94%     Patient Vitals for the past 24 hrs:   BP Temp Temp src Pulse Resp SpO2 Weight   08/04/14 0725 - - - - - - 194 lb 1.6 oz (88.043 kg)   08/04/14 0430 107/70 mmHg 98.8 ??F (37.1 ??C) Temporal 94 16 94 % 193 lb 11.2 oz (87.862 kg)   08/04/14 0015 111/65 mmHg 98.1 ??F (36.7 ??C) Temporal 82 18 96 % -   08/03/14 2145 108/68 mmHg 98.6 ??F (37 ??C) Temporal 81 18 93 % -   08/03/14 1616 101/64 mmHg 97.9 ??F (36.6 ??C) Temporal 79 18 92 % -   08/03/14 1300 110/72 mmHg 97.9 ??F (36.6 ??C) Temporal 81 18 96 % -     Patient Vitals for the past 96 hrs (Last 3 readings):   Weight   08/04/14 0725 194 lb 1.6 oz (88.043 kg)   08/04/14 0430 193 lb 11.2 oz (87.862 kg)   08/03/14 0651 193 lb 8 oz (87.771 kg)           Intake/Output Summary (Last 24 hours) at 08/04/14 0941  Last data filed at 08/03/14 1628   Gross per 24 hour   Intake    480 ml   Output      0 ml   Net    480 ml         Physical Exam:   S1, S2 normal, no murmur, rub or gallop, regular rate and rhythm  clear to auscultation bilaterally  abdomen is soft without significant tenderness, masses, organomegaly or guarding  extremities normal, atraumatic, no cyanosis or edema    Labs:  Lab Results   Component Value Date    WBC 2.1* 08/04/2014    HGB 7.5* 08/04/2014    HCT 22.9* 08/04/2014    PLT 56* 08/04/2014    CHOL 101 04/02/2014    TRIG  83 04/02/2014    HDL 22* 04/02/2014    ALT 15 07/31/2014    AST 32 07/31/2014    NA 135* 08/04/2014    K 5.1 08/04/2014    CL 108 08/04/2014    CREATININE 0.7* 08/04/2014    BUN 19 08/04/2014    CO2 22 08/04/2014    TSH 2.15 08/15/2013  PSA 0.55 08/15/2013    INR 1.33* 08/01/2014    LABA1C 5.0 01/10/2014    LABMICR YES 07/25/2014     Lab Results   Component Value Date    TROPONINI <0.01 07/25/2014       Recent Imaging Results are Reviewed:  Xr Chest Standard Two Vw    07/27/2014   EXAMINATION: TWO VIEWS OF THE CHEST  07/27/2014 5:52 pm  COMPARISON: 07/25/2014  HISTORY: ORDERING SYSTEM PROVIDED HISTORY: follow up pneumonia TECHNOLOGIST PROVIDED HISTORY: Ordering Physician Provided Reason for Exam: follow up pneumonia Acuity: Acute Type of Exam: Initial  FINDINGS: Compression fracture of the lower thoracic spine is again seen.  Minimal right base atelectasis is noted.  There is scarring on the left.  Remote rib fractures are seen.  Cardiac silhouette within normal limits.  There is a hiatal hernia     07/27/2014   IMPRESSION: No acute disease     Xr Chest Standard Two Vw    07/25/2014   EXAMINATION: TWO VIEWS OF THE CHEST  07/25/2014 8:17 am  COMPARISON: Chest x-ray Jul 07, 2014  HISTORY: ORDERING SYSTEM PROVIDED HISTORY: generalized weakness, hypotension TECHNOLOGIST PROVIDED HISTORY: Ordering Physician Provided Reason for Exam: weakness Acuity: Unknown Type of Exam: Unknown  FINDINGS: There is new focal density of the right upper lobe above the hilum.  There also is new faint airspace disease of the lateral right lung base.  There is new focal opacity of the retrocardiac left lower lobe.  There is blunting of the left costophrenic angle consistent with pleural fluid.     07/25/2014   IMPRESSION: New bilateral multifocal airspace disease, most likely atelectasis or pneumonia although follow-up to ensure resolution is recommended.  New small to moderate size left pleural effusion.     Ct Head Wo Contrast    07/07/2014    EXAMINATION: CT OF THE HEAD WITHOUT CONTRAST  07/07/2014 11:11 pm  TECHNIQUE: CT of the head was performed without the administration of intravenous contrast.  COMPARISON: 04/01/2014  HISTORY: ORDERING SYSTEM PROVIDED HISTORY: dizzy TECHNOLOGIST PROVIDED HISTORY: Ordering Physician Provided Reason for Exam: dizzy  FINDINGS: BRAIN/VENTRICLES: There is no acute intracranial hemorrhage, mass effect or midline shift.  No abnormal extra-axial fluid collection.  The gray-white differentiation is maintained without evidence of an acute infarct.  There is no evidence of hydrocephalus.  ORBITS: The visualized portion of the orbits demonstrate no acute abnormality.  SINUSES: There is minimal mucosal thickening in the left maxillary sinus. The remaining visualized paranasal sinuses are clear.  The mastoid air cells are unremarkable.  SOFT TISSUES/SKULL:  No acute abnormality of the visualized skull or soft tissues.     07/07/2014   IMPRESSION: No acute intracranial abnormality.     Ct Chest W Contrast    07/26/2014   EXAMINATION: CT OF THE CHEST WITH CONTRAST 07/26/2014 2:14 pm  TECHNIQUE: CT of the chest was performed with the administration of intravenous contrast. Multiplanar reformatted images are provided for review. Dose modulation, iterative reconstruction, and/or weight based adjustment of the mA/kV was utilized to reduce the radiation dose to as low as reasonably achievable.  COMPARISON: 06/27/2014  HISTORY: ORDERING SYSTEM PROVIDED HISTORY: SHORTNESS OF BREATH  FINDINGS: Mediastinum: Stable mediastinal lymph nodes, no convincing adenopathy. Thoracic aorta normal in caliber.  No pericardial effusion.  .  Lungs/pleura: Interval decrease in right pleural effusion.  Stable trace left pleural effusion.  There are multifocal airspace opacities that are predominantly bandlike in morphology, mostly in the lower lobes, right middle lobe,  and lingula.  There are a cluster of tiny nodules in the lateral right upper lobe.  There is a  stable 1.3 cm nodule in the superior segment of the right lower lobe.  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  There is a tiny nonobstructing right renal stone.  Large venous collaterals are noted adjacent to the spleen  Soft Tissues/Bones: There is bilateral gynecomastia.  There are multiple thyroid nodules     07/26/2014   IMPRESSION: 1. Multifocal airspace opacities in the lower lobes, right middle lobe, and lingula have an appearance compatible with atelectasis, with or without pneumonia.  Cluster of nodular densities in the lateral right upper lobe most likely represents atypical infection.  1.3 cm nodule in the superior segment of the right lower lobe warrants six-month follow-up 2. Interval decrease in right pleural effusion 3. 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  LIVER: Evaluation of the liver is limited.  There is diffuse increased echogenicity with no focal lesion noted.  Somewhat nodular contour of the liver is compatible with known cirrhosis.  BILIARY SYSTEM: Limited imaging of the gallbladder is grossly unremarkable.  Common bile duct is within normal limits measuring 6 mm.  KIDNEYS: Right kidney is nonvisualized.  Left kidney measures 10.1 x 4.9 x 5.3 cm and is grossly unremarkable.  PANCREAS: Pancreas is nonvisualized.  SPLEEN: Spleen measures 10.1 cm.  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  IVC: The IVC is patent.  AORTA: Aorta is obscured.  OTHER: No evidence of ascites.     07/08/2014   IMPRESSION: Limited study as above.  Cirrhosis and portal hypertension.  No evidence of ascites.     Ct Abdomen  Pelvis W Iv Contrast Additional Contrast?: Oral    07/30/2014   EXAMINATION: CT OF THE ABDOMEN AND PELVIS WITH CONTRAST 07/30/2014 11:54 am  TECHNIQUE: CT of the abdomen and pelvis was performed with the administration of intravenous contrast. Multiplanar reformatted images are provided for review. Dose modulation, iterative reconstruction, and/or weight based adjustment of the mA/kV was utilized to reduce the radiation dose to as low as reasonably achievable.  COMPARISON: 09/07/2010  HISTORY: ORDERING SYSTEM PROVIDED HISTORY: ABD PAIN, FEVER, NO RECENT SURGERY TECHNOLOGIST PROVIDED HISTORY: Additional Contrast?->Oral Ordering Physician Provided Reason for Exam: ABD PAIN, FEVER, NO RECENT SURGERY Acuity: Unknown Type of Exam: Unknown  Unexplained anemia.  Evaluate for retroperitoneal hemorrhage.  FINDINGS: Lower Chest: There is mild bibasilar pleural and parenchymal disease, new from the prior study.  Organs: The liver is shrunken with a nodular contour consistent with cirrhosis.  No focal liver lesion.  The main portal vein is small in size and grossly patent.  There is a recannulized umbilical vein.  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  GI/Bowel: Bowel loops are unremarkable.  The gallbladder is moderately distended.  There are no calcified gallstones.  Pelvis: Urinary bladder, prostate gland and seminal vesicles are unremarkable.  Peritoneum/Retroperitoneum: No adenopathy, free air or free fluid.  Bones/Soft Tissues: There is bilateral flank edema.  There is a large left gluteus muscle mass/hematoma extending into the left upper thigh.     07/30/2014  IMPRESSION: Large left gluteus muscle mass/hematoma likely accounting for the unexplained anemia.  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     Xr Chest Portable    07/08/2014   EXAMINATION: SINGLE VIEW OF THE CHEST  07/07/2014 11:34 pm  COMPARISON: 06/03/2014  HISTORY: ORDERING SYSTEM PROVIDED HISTORY: other TECHNOLOGIST  PROVIDED HISTORY: Ordering Physician Provided Reason for Exam: dizziness Acuity: Unknown Type of Exam: Unknown  FINDINGS: Cardiac leads project over the chest.  Diffuse interstitial opacity is demonstrated bilaterally.  Heterogeneous opacity is seen adjacent to the left heart border.  No evidence of pleural effusion.  Negative for pneumothorax. Cardiac and mediastinal silhouettes are similar to prior.  Old posterior left 7th rib fracture.     07/08/2014   IMPRESSION: Mild pulmonary edema.     Vl Extremity Venous Right    07/10/2014   Lower Extremities DVT Study   Demographics    Patient Name      Andrew [name]    Date of Study     07/09/2014          Gender              Male    Patient Number    [id number]          Date of Birth       Jun 26, 1940    Visit Number      [id number]         Age                 12 year(s)    Accession Number  [id number]           Room Number         2376    Corporate ID      [id number]            Sonographer         Erasmo Leventhal,                                                            RVS    Ordering          Deniece Portela,   Interpreting        [facility]  Physician         MD                  Physician           Peggyann Shoals,                                                            MD, [facility], [place]   Procedure  Type of Study:    Veins:Lower Extremities DVT Study, VL EXTREMITY VENOUS DUPLEX RIGHT.    Generic Orders:VL EXTREMITY VENOUS DUPLEX RIGHT.    Vascular Sonographer Report   Additional Indications:Swelling  Impressions Right Impression Acute totally occluding deep vein thrombosis involving the right proximal PTV and one gastroc vein zone 5-6. Acute partially occluding deep vein thrombosis involving the popliteal vein and right one gastroc vein zone 5-6. No other evidence of  deep vein or superficial vein thrombosis involving the right lower extremity and the left common femoral vein. Calf veins were not well visualized on the right due to patient positioning and edema.   Verbal to [employer].  Conclusions    Summary    Acute totally occluding deep vein thrombosis involving the right proximal  PTV and one gastroc vein zone 5-6.  Acute partially occluding deep vein thrombosis involving the popliteal vein  and right one gastroc vein zone 5-6.  Calf veins were not well visualized on the right due to patient positioning  and edema.    Signature    ------------------------------------------------------------------  Electronically signed by Moses Manners, MD, [facility], RPVI  (Interpreting physician) on 07/10/2014 at 11:34 AM  ------------------------------------------------------------------   Patient Status:Routine. Study Location:[facility] - Vascular Lab. Technical Quality:Poor visualization.  Risk Factors History +------------------+----------+----------------------------------------------+ !Diagnosis         !Date      !Comments                                      ! +------------------+----------+----------------------------------------------+ !Previous Procedure!06/27/2014!Venous Doppler Bilateral: Acute DVT right     ! !                  !          !gastroc veins.                                ! +------------------+----------+----------------------------------------------+  Velocities are measured in cm/s ; Diameters are measured in mm  Right Lower Extremities DVT Study Measurements Right 2D Measurements +------------------------+----------+---------------+----------+ !Location                !Visualized!Compressibility!Thrombosis! +------------------------+----------+---------------+----------+ !Sapheno Femoral Junction!Yes       !Yes            !None      ! +------------------------+----------+---------------+----------+ !GSV Thigh               !Yes       !Yes            !None      ! +------------------------+----------+---------------+----------+ !Common Femoral          !Yes       !Yes            !None      !  +------------------------+----------+---------------+----------+ !Prox Femoral            !Yes       !Yes            !None      ! +------------------------+----------+---------------+----------+ !Mid Femoral             !Yes       !Yes            !None      ! +------------------------+----------+---------------+----------+ !Dist Femoral            !Yes       !Yes            !None      ! +------------------------+----------+---------------+----------+ !Deep Femoral            !Yes       !Yes            !None      ! +------------------------+----------+---------------+----------+ !Popliteal               !  Yes       !Partial        !Acute     ! +------------------------+----------+---------------+----------+ !GSV Below Knee          !Yes       !Yes            !None      ! +------------------------+----------+---------------+----------+ !Gastroc                 !Yes       !Partial        !Acute     ! +------------------------+----------+---------------+----------+ !PTV                     !Yes       !No             !Acute     ! +------------------------+----------+---------------+----------+ !Peroneal                !Yes       !Yes            !None      ! +------------------------+----------+---------------+----------+ !SSV                     !Yes       !Yes            !None      ! +------------------------+----------+---------------+----------+  Right Doppler Measurements +--------------+------+------+------------+ !Location      !Signal!Reflux!Reflux (sec)! +--------------+------+------+------------+ !Common Femoral!Phasic!      !            ! +--------------+------+------+------------+ !Femoral       !Phasic!      !            ! +--------------+------+------+------------+ !Deep Femoral  !Phasic!      !            ! +--------------+------+------+------------+ !Popliteal     !Phasic!      !            ! +--------------+------+------+------------+  Left Lower Extremities DVT Study Measurements Left 2D Measurements  +------------------------+----------+---------------+----------+ !Location                !Visualized!Compressibility!Thrombosis! +------------------------+----------+---------------+----------+ !Sapheno Femoral Junction!Yes       !Yes            !None      ! +------------------------+----------+---------------+----------+ !Common Femoral          !Yes       !Yes            !None      ! +------------------------+----------+---------------+----------+  Left Doppler Measurements +--------------+------+------+------------+ !Location      !Signal!Reflux!Reflux (sec)! +--------------+------+------+------------+ !Common Femoral!Phasic!      !            ! +--------------+------+------+------------+    Vl Extremity Venous Bilateral    07/09/2014   Vascular Lower Extremities DVT Study Procedure  -- PRELIMINARY SONOGRAPHER REPORT --    Demographics    Patient Name      Andrew [name]    Date of Study     07/09/2014          Gender              Male    Patient Number    [id number]          Date of Birth       December 22, 1940    Visit Number      [id number]  Age                 72 year(s)    Accession Number  [id number]           Room Number         7673    Corporate ID      [id number]            Sonographer         Erasmo Leventhal,                                                            RVS    Ordering          Deniece Portela,   Interpreting        [facility]  Physician         MD                  Physician   Procedure  Type of Study:    Veins:Lower Extremities DVT Study, VL EXTREMITY VENOUS DUPLEX RIGHT.   Tech Comments Right Acute totally occluding deep vein thrombosis involving the right proximal PTV and one gastroc vein zone 5-6. Acute partially occluding deep vein thrombosis involving the popliteal vein and right one gastroc vein zone 5-6. No other evidence of deep vein or superficial vein thrombosis involving the right lower extremity and the left common femoral vein. Calf veins were not well visualized on the  right due to patient positioning and edema.  verbal to [employer].      Assessment and Plan:  Patient Active Hospital Problem List:   Pneumonia (07/25/2014)    Assessment: on augmentin    Plan: cont. Recheck cxr today   Benign essential HTN (07/03/2014)    Assessment: Stable    Plan: Continue present orders/plan   Hepatic encephalopathy ([place]) (07/07/2014)    Assessment: on rifaxamin, lactulose    Plan: Continue present orders/plan   DVT (deep venous thrombosis) ([place]) (07/10/2014)    Assessment: s/p ivc filter.    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 However, at this time, pt and wife are not ready for this.  Cont aggressive care            Deniece Portela  08/04/2014

## 2014-08-04 NOTE — Plan of Care (Signed)
Problem: Risk for Impaired Skin Integrity  Goal: Tissue integrity - skin and mucous membranes  Structural intactness and normal physiological function of skin and  mucous membranes.   Outcome: Ongoing  Reposition every 2.     Problem: PROTECTIVE PRECAUTIONS  Goal: Patient will remain free of nosocomial Infections  Outcome: Ongoing  Precautions in place. Wear mask and gloves when entering room.

## 2014-08-04 NOTE — Plan of Care (Signed)
Problem: Risk for Impaired Skin Integrity  Goal: Tissue integrity - skin and mucous membranes  Structural intactness and normal physiological function of skin and  mucous membranes.   Outcome: Ongoing  At risk for skin breakdown.  See Braden scale.  Remains on bedrest.  Unable to position self in bed.  Turn and reposition every two hours and as needed.  Heels elevated off bed. Protective barrier placed as needed.  Patient kept clean and dry.  Pillows used for positioning. Will continue to monitor for skin breakdown.

## 2014-08-04 NOTE — Progress Notes (Signed)
The patient is resting with eyes closed, doesn't arouse to soft voice. Will continue to monitor. Keshayla Schrum RN, BSN.

## 2014-08-05 LAB — CBC WITH AUTO DIFFERENTIAL
Bands Relative: 3 % (ref 0–7)
Basophils %: 0 %
Basophils Absolute: 0 10*3/uL (ref 0.0–0.2)
Eosinophils %: 2 %
Eosinophils Absolute: 0 10*3/uL (ref 0.0–0.6)
Hematocrit: 22.7 % — ABNORMAL LOW (ref 40.5–52.5)
Hemoglobin: 7.6 g/dL — ABNORMAL LOW (ref 13.5–17.5)
Lymphocytes %: 55 %
Lymphocytes Absolute: 1.1 10*3/uL (ref 1.0–5.1)
MCH: 33 pg (ref 26.0–34.0)
MCHC: 33.3 g/dL (ref 31.0–36.0)
MCV: 99.2 fL (ref 80.0–100.0)
MPV: 8.5 fL (ref 5.0–10.5)
Monocytes %: 12 %
Monocytes Absolute: 0.2 10*3/uL (ref 0.0–1.3)
Myelocyte Percent: 1 % — AB
Neutrophils %: 27 %
Neutrophils Absolute: 0.6 10*3/uL — CL (ref 1.7–7.7)
PLATELET SLIDE REVIEW: DECREASED
Platelets: 49 10*3/uL — ABNORMAL LOW (ref 135–450)
RBC: 2.29 M/uL — ABNORMAL LOW (ref 4.20–5.90)
RDW: 16.5 % — ABNORMAL HIGH (ref 12.4–15.4)
WBC: 2 10*3/uL — ABNORMAL LOW (ref 4.0–11.0)

## 2014-08-05 LAB — HEPATIC FUNCTION PANEL
ALT: 15 U/L (ref 10–40)
AST: 29 U/L (ref 15–37)
Albumin: 1.7 g/dL — ABNORMAL LOW (ref 3.4–5.0)
Alkaline Phosphatase: 70 U/L (ref 40–129)
Bilirubin, Direct: 0.5 mg/dL — ABNORMAL HIGH (ref 0.0–0.3)
Bilirubin, Indirect: 1.3 mg/dL — ABNORMAL HIGH (ref 0.0–1.0)
Total Bilirubin: 1.8 mg/dL — ABNORMAL HIGH (ref 0.0–1.0)
Total Protein: 5.1 g/dL — ABNORMAL LOW (ref 6.4–8.2)

## 2014-08-05 LAB — BASIC METABOLIC PANEL
Anion Gap: 6 (ref 3–16)
BUN: 21 mg/dL — ABNORMAL HIGH (ref 7–20)
CO2: 24 mmol/L (ref 21–32)
Calcium: 8.5 mg/dL (ref 8.3–10.6)
Chloride: 108 mmol/L (ref 99–110)
Creatinine: 0.7 mg/dL — ABNORMAL LOW (ref 0.8–1.3)
GFR African American: >60 (ref >60)
GFR Non-African American: >60 (ref >60)
Glucose: 108 mg/dL — ABNORMAL HIGH (ref 70–99)
Potassium: 5.2 mmol/L — ABNORMAL HIGH (ref 3.5–5.1)
Sodium: 138 mmol/L (ref 136–145)

## 2014-08-05 MED ORDER — FUROSEMIDE 10 MG/ML IJ SOLN
10 MG/ML | Freq: Every day | INTRAMUSCULAR | Status: DC
Start: 2014-08-05 — End: 2014-08-06
  Administered 2014-08-05: 16:00:00 20 mg via INTRAVENOUS

## 2014-08-05 MED FILL — XIFAXAN 550 MG PO TABS: 550 MG | ORAL | Qty: 1

## 2014-08-05 MED FILL — FUROSEMIDE 40 MG PO TABS: 40 MG | ORAL | Qty: 1

## 2014-08-05 MED FILL — NORMAL SALINE FLUSH 0.9 % IV SOLN: 0.9 % | INTRAVENOUS | Qty: 10

## 2014-08-05 MED FILL — LISINOPRIL 5 MG PO TABS: 5 MG | ORAL | Qty: 1

## 2014-08-05 MED FILL — CITALOPRAM HYDROBROMIDE 20 MG PO TABS: 20 MG | ORAL | Qty: 1

## 2014-08-05 MED FILL — VITAMIN D3 25 MCG (1000 UT) PO TABS: 25 MCG (1000 UT) | ORAL | Qty: 1

## 2014-08-05 MED FILL — AMOXICILLIN-POT CLAVULANATE 875-125 MG PO TABS: 875-125 MG | ORAL | Qty: 1

## 2014-08-05 MED FILL — EPLERENONE 25 MG PO TABS: 25 MG | ORAL | Qty: 2

## 2014-08-05 MED FILL — FUROSEMIDE 10 MG/ML IJ SOLN: 10 MG/ML | INTRAMUSCULAR | Qty: 2

## 2014-08-05 MED FILL — CARVEDILOL 6.25 MG PO TABS: 6.25 MG | ORAL | Qty: 1

## 2014-08-05 NOTE — Plan of Care (Signed)
Problem: Mental Status - Impaired:  Intervention: Assess signs and symptoms of altered mental status  Patient slow to respond. Answers question approprietly. Will continue to monitor.

## 2014-08-05 NOTE — Progress Notes (Signed)
Progress Note - Dr. Tasia Catchings - Internal Medicine  PCP: Alecia Lemming, DO [place] / [place] [phone]    Hospital Day: 11  Code Status: Full Code  Current Diet: DIET GENERAL; No Added Salt (3-4 GM)        CC: follow up on medical issues    Subjective:   Andrew [name] is a 74 y.o. male.    He denies problems    About the same  Drowsy  oob to chair  cxr shows poss pulm edema    He denies chest pain, complains of shortness of breath, denies nausea,  denies emesis.    10 system Review of Systems is reviewed with patient, and pertinent positives are listed here: None . Otherwise, Review of systems is negative.     I have reviewed the patient's medical and social history in detail and updated the computerized patient record.  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 He  has past surgical history that includes AV fistula repair (1970's); Colonoscopy (4/12); Cardiac catheterization; eye surgery (04/12/2014); Cosmetic surgery (04/2013); bone marrow biopsy; and knee surgery.[name] He  reports that he has never smoked. He has never used smokeless tobacco. He reports that he does not drink alcohol or use illicit drugs..        Active Hospital Problems    Diagnosis Date Noted   ??? Hematoma [T14.8] 07/31/2014   ??? Pneumonia of both lungs due to infectious organism [J18.9]    ??? Traumatic hematoma of buttock [S30.0XXA]    ??? Pneumonia [J18.9] 07/25/2014   ??? 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 DVT (deep venous  thrombosis) (HCC) [I82.409] 07/10/2014   ??? Hepatic encephalopathy (HCC) [K72.90] 07/07/2014   ??? Benign essential HTN [I10] 07/03/2014       Current facility-administered medications: 0.9 % sodium chloride bolus, 250 mL, Intravenous, Once  sodium chloride flush 0.9 % injection 10 mL, 10 mL, Intravenous, 2 times per day  sodium chloride flush 0.9 % injection 10 mL, 10 mL, Intravenous, PRN  acetaminophen (TYLENOL) tablet 650 mg, 650 mg, Oral, Q4H PRN  magnesium hydroxide (MILK OF MAGNESIA) 400 MG/5ML suspension 30 mL, 30 mL, Oral, Daily PRN  ondansetron (ZOFRAN) injection 4 mg, 4 mg, Intravenous, Q6H PRN  0.9 % sodium chloride bolus, 250 mL, Intravenous, Once  lactulose (CHRONULAC) 10 GM/15ML solution 20 g, 20 g, Oral, TID  amoxicillin-clavulanate (AUGMENTIN) 875-125 MG per tablet 1 tablet, 1 tablet, Oral, 2 times per day  ioversol (OPTIRAY) 68 % injection 100 mL, 100 mL, Intravenous, ONCE PRN  sodium chloride (PF) 0.9 % injection 10 mL, 10 mL, Intravenous, PRN  carvedilol (COREG) tablet 6.25 mg, 6.25 mg, Oral, BID WC  vitamin D (CHOLECALCIFEROL) tablet 1,000 Units, 1,000 Units, Oral, Daily  citalopram (CELEXA) tablet 10 mg, 10 mg, Oral, Daily  eplerenone (INSPRA) tablet 50 mg, 50 mg, Oral, Daily  furosemide (LASIX) tablet 40 mg, 40 mg, Oral, Daily  lisinopril (PRINIVIL;ZESTRIL) tablet 5 mg, 5 mg, Oral, Nightly  magnesium oxide (MAG-OX) tablet 400 mg, 400 mg, Oral, Daily  rifaximin (XIFAXAN) tablet 550 mg, 550 mg, Oral, BID  traMADol (ULTRAM) tablet 50 mg,  50 mg, Oral, Q12H PRN  sodium chloride flush 0.9 % injection 10 mL, 10 mL, Intravenous, 2 times per day  sodium chloride flush 0.9 % injection 10 mL, 10 mL, Intravenous, PRN  acetaminophen (TYLENOL) tablet 650 mg, 650 mg, Oral, Q4H PRN  HYDROcodone-acetaminophen (NORCO) 5-325 MG per tablet 1 tablet, 1 tablet, Oral, Q4H PRN **OR** HYDROcodone-acetaminophen (NORCO) 5-325 MG per tablet 2 tablet, 2 tablet, Oral, Q4H PRN  morphine (PF) injection 2 mg, 2 mg, Intravenous,  Q2H PRN **OR** morphine (PF) injection 4 mg, 4 mg, Intravenous, Q2H PRN  magnesium hydroxide (MILK OF MAGNESIA) 400 MG/5ML suspension 30 mL, 30 mL, Oral, Daily PRN  ondansetron (ZOFRAN) injection 4 mg, 4 mg, Intravenous, Q6H PRN  potassium chloride SA (K-DUR;KLOR-CON M) tablet 40 mEq, 40 mEq, Oral, PRN **OR** potassium chloride 20 MEQ/15ML (10%) oral solution 40 mEq, 40 mEq, Oral, PRN **OR** potassium chloride 10 mEq/100 mL IVPB (Peripheral Line), 10 mEq, Intravenous, PRN         Objective:  BP 95/59 mmHg   Pulse 82   Temp(Src) 98.4 ??F (36.9 ??C) (Temporal)   Resp 16   Ht $R'5\' 5"'fU$  (1.651 m)   Wt 194 lb 3.2 oz (88.089 kg)   BMI 32.32 kg/m2   SpO2 95%     Patient Vitals for the past 24 hrs:   BP Temp Temp src Pulse Resp SpO2 Weight   08/05/14 0813 - - - - - - 194 lb 3.2 oz (88.089 kg)   08/05/14 0431 95/59 mmHg 98.4 ??F (36.9 ??C) Temporal 82 16 95 % -   08/04/14 2346 94/57 mmHg 100 ??F (37.8 ??C) Temporal 70 16 96 % -   08/04/14 2026 95/60 mmHg 98.8 ??F (37.1 ??C) Temporal 68 16 96 % -   08/04/14 1515 100/61 mmHg 98.4 ??F (36.9 ??C) Temporal 71 20 96 % -   08/04/14 1034 99/66 mmHg 98.7 ??F (37.1 ??C) Temporal 96 20 - -   08/04/14 1015 - 98.6 ??F (37 ??C) Oral - - 96 % -   08/04/14 1000 94/55 mmHg 99.4 ??F (37.4 ??C) Temporal 84 18 (!) 88 % -     Patient Vitals for the past 96 hrs (Last 3 readings):   Weight   08/05/14 0813 194 lb 3.2 oz (88.089 kg)   08/04/14 0725 194 lb 1.6 oz (88.043 kg)   08/04/14 0430 193 lb 11.2 oz (87.862 kg)           Intake/Output Summary (Last 24 hours) at 08/05/14 0926  Last data filed at 08/04/14 1312   Gross per 24 hour   Intake    360 ml   Output      0 ml   Net    360 ml         Physical Exam:   S1, S2 normal, no murmur, rub or gallop, regular rate and rhythm  Rales in bases  abdomen is soft without significant tenderness, masses, organomegaly or guarding  extremities normal, atraumatic, no cyanosis or edema    Labs:  Lab Results   Component Value Date    WBC 2.0* 08/05/2014    HGB 7.6* 08/05/2014    HCT  22.7* 08/05/2014    PLT 56* 08/04/2014    CHOL 101 04/02/2014    TRIG 83 04/02/2014    HDL 22* 04/02/2014    ALT 15 08/05/2014    AST 29 08/05/2014    NA 138 08/05/2014    K 5.2* 08/05/2014    CL 108  08/05/2014    CREATININE 0.7* 08/05/2014    BUN 21* 08/05/2014    CO2 24 08/05/2014    TSH 2.15 08/15/2013    PSA 0.55 08/15/2013    INR 1.33* 08/01/2014    LABA1C 5.0 01/10/2014    LABMICR YES 07/25/2014     Lab Results   Component Value Date    TROPONINI <0.01 07/25/2014       Recent Imaging Results are Reviewed:  Xr Chest Standard Two Vw    08/04/2014   EXAMINATION: TWO VIEWS OF THE CHEST  08/04/2014 12:58 pm  COMPARISON: 07/27/2014 radiograph  HISTORY: ORDERING SYSTEM PROVIDED HISTORY: cough TECHNOLOGIST PROVIDED HISTORY: Ordering Physician Provided Reason for Exam: cough Acuity: Acute Type of Exam: Ongoing  FINDINGS: The heart is enlarged.  Mediastinum is normal.  Mild pulmonary vascular congestion is stable centrally.  Since prior imaging, a small right-sided pleural effusion has developed, and there is moderate ground-glass attenuation in the right lower lung zone.  Persistent mild pleural thickening versus fluid along the lateral aspect of the lower left chest.  No skeletal abnormalities.  Remote compression fracture in the lower thoracic spine, stable.     08/04/2014   IMPRESSION: In this patient with cardiomegaly, new right pleural effusion and ground-glass attenuation in the right lung base may represent developing pulmonary edema.  Atelectasis or developing pneumonia cannot be excluded.     Xr Chest Standard Two Vw    07/27/2014   EXAMINATION: TWO VIEWS OF THE CHEST  07/27/2014 5:52 pm  COMPARISON: 07/25/2014  HISTORY: ORDERING SYSTEM PROVIDED HISTORY: follow up pneumonia TECHNOLOGIST PROVIDED HISTORY: Ordering Physician Provided Reason for Exam: follow up pneumonia Acuity: Acute Type of Exam: Initial  FINDINGS: Compression fracture of the lower thoracic spine is again seen.  Minimal right base atelectasis is  noted.  There is scarring on the left.  Remote rib fractures are seen.  Cardiac silhouette within normal limits.  There is a hiatal hernia     07/27/2014   IMPRESSION: No acute disease     Xr Chest Standard Two Vw    07/25/2014   EXAMINATION: TWO VIEWS OF THE CHEST  07/25/2014 8:17 am  COMPARISON: Chest x-ray Jul 07, 2014  HISTORY: ORDERING SYSTEM PROVIDED HISTORY: generalized weakness, hypotension TECHNOLOGIST PROVIDED HISTORY: Ordering Physician Provided Reason for Exam: weakness Acuity: Unknown Type of Exam: Unknown  FINDINGS: There is new focal density of the right upper lobe above the hilum.  There also is new faint airspace disease of the lateral right lung base.  There is new focal opacity of the retrocardiac left lower lobe.  There is blunting of the left costophrenic angle consistent with pleural fluid.     07/25/2014   IMPRESSION: New bilateral multifocal airspace disease, most likely atelectasis or pneumonia although follow-up to ensure resolution is recommended.  New small to moderate size left pleural effusion.     Ct Head Wo Contrast    07/07/2014   EXAMINATION: CT OF THE HEAD WITHOUT CONTRAST  07/07/2014 11:11 pm  TECHNIQUE: CT of the head was performed without the administration of intravenous contrast.  COMPARISON: 04/01/2014  HISTORY: ORDERING SYSTEM PROVIDED HISTORY: dizzy TECHNOLOGIST PROVIDED HISTORY: Ordering Physician Provided Reason for Exam: dizzy  FINDINGS: BRAIN/VENTRICLES: There is no acute intracranial hemorrhage, mass effect or midline shift.  No abnormal extra-axial fluid collection.  The gray-white differentiation is maintained without evidence of an acute infarct.  There is no evidence of hydrocephalus.  ORBITS: The visualized portion of the orbits demonstrate no acute  abnormality.  SINUSES: There is minimal mucosal thickening in the left maxillary sinus. The remaining visualized paranasal sinuses are clear.  The mastoid air cells are unremarkable.  SOFT TISSUES/SKULL:  No acute abnormality  of the visualized skull or soft tissues.     07/07/2014   IMPRESSION: No acute intracranial abnormality.     Ct Chest W Contrast    07/26/2014   EXAMINATION: CT OF THE CHEST WITH CONTRAST 07/26/2014 2:14 pm  TECHNIQUE: CT of the chest was performed with the administration of intravenous contrast. Multiplanar reformatted images are provided for review. Dose modulation, iterative reconstruction, and/or weight based adjustment of the mA/kV was utilized to reduce the radiation dose to as low as reasonably achievable.  COMPARISON: 06/27/2014  HISTORY: ORDERING SYSTEM PROVIDED HISTORY: SHORTNESS OF BREATH  FINDINGS: Mediastinum: Stable mediastinal lymph nodes, no convincing adenopathy. Thoracic aorta normal in caliber.  No pericardial effusion.  .  Lungs/pleura: Interval decrease in right pleural effusion.  Stable trace left pleural effusion.  There are multifocal airspace opacities that are predominantly bandlike in morphology, mostly in the lower lobes, right middle lobe, and lingula.  There are a cluster of tiny nodules in the lateral right upper lobe.  There is a stable 1.3 cm nodule in the superior segment of the right lower lobe.  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  There is a tiny nonobstructing right renal stone.  Large venous collaterals are noted adjacent to the spleen  Soft Tissues/Bones: There is bilateral gynecomastia.  There are multiple thyroid nodules     07/26/2014   IMPRESSION: 1. Multifocal airspace opacities in the lower lobes, right middle lobe, and lingula have an appearance compatible with atelectasis, with or without pneumonia.  Cluster of nodular densities in the lateral right upper lobe most likely represents atypical infection.  1.3 cm nodule in the superior segment of the right lower lobe warrants six-month follow-up 2. Interval decrease in right pleural effusion 3. 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  LIVER: Evaluation of the liver is limited.  There is diffuse increased echogenicity with no focal lesion noted.  Somewhat nodular contour of the liver is compatible with known cirrhosis.  BILIARY SYSTEM: Limited imaging of the gallbladder is grossly unremarkable.  Common bile duct is within normal limits measuring 6 mm.  KIDNEYS: Right kidney is nonvisualized.  Left kidney measures 10.1 x 4.9 x 5.3 cm and is grossly unremarkable.  PANCREAS: Pancreas is nonvisualized.  SPLEEN: Spleen measures 10.1 cm.  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  IVC: The IVC is patent.  AORTA: Aorta is obscured.  OTHER: No evidence of ascites.     07/08/2014   IMPRESSION: Limited study as above.  Cirrhosis and portal hypertension.  No evidence of ascites.     Ct Abdomen Pelvis W Iv Contrast Additional Contrast?: Oral    07/30/2014   EXAMINATION: CT OF THE ABDOMEN AND PELVIS WITH CONTRAST 07/30/2014 11:54 am  TECHNIQUE: CT of the abdomen and pelvis was performed with the administration of intravenous contrast. Multiplanar reformatted images are provided for review. Dose modulation, iterative reconstruction, and/or weight based adjustment of the mA/kV was utilized to  reduce the radiation dose to as low as reasonably achievable.  COMPARISON: 09/07/2010  HISTORY: ORDERING SYSTEM PROVIDED HISTORY: ABD PAIN, FEVER, NO RECENT SURGERY TECHNOLOGIST PROVIDED HISTORY: Additional Contrast?->Oral Ordering Physician Provided Reason for Exam: ABD PAIN, FEVER, NO RECENT SURGERY Acuity: Unknown Type of Exam: Unknown  Unexplained anemia.  Evaluate for retroperitoneal hemorrhage.  FINDINGS: Lower Chest: There is mild bibasilar pleural and  parenchymal disease, new from the prior study.  Organs: The liver is shrunken with a nodular contour consistent with cirrhosis.  No focal liver lesion.  The main portal vein is small in size and grossly patent.  There is a recannulized umbilical vein.  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  GI/Bowel: Bowel loops are unremarkable.  The gallbladder is moderately distended.  There are no calcified gallstones.  Pelvis: Urinary bladder, prostate gland and seminal vesicles are unremarkable.  Peritoneum/Retroperitoneum: No adenopathy, free air or free fluid.  Bones/Soft Tissues: There is bilateral flank edema.  There is a large left gluteus muscle mass/hematoma extending into the left upper thigh.     07/30/2014   IMPRESSION: Large left gluteus muscle mass/hematoma likely accounting for the unexplained anemia.  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     Xr Chest Portable    07/08/2014   EXAMINATION: SINGLE VIEW OF THE CHEST  07/07/2014 11:34 pm  COMPARISON: 06/03/2014  HISTORY: ORDERING SYSTEM PROVIDED HISTORY: other TECHNOLOGIST PROVIDED HISTORY: Ordering Physician Provided Reason for Exam: dizziness Acuity: Unknown Type of Exam: Unknown  FINDINGS: Cardiac leads project over the chest.  Diffuse interstitial opacity is demonstrated bilaterally.  Heterogeneous opacity is seen adjacent to the left heart border.  No evidence of pleural effusion.  Negative for pneumothorax. Cardiac and mediastinal silhouettes are similar to prior.  Old posterior left 7th rib fracture.     07/08/2014   IMPRESSION: Mild pulmonary edema.     Vl Extremity Venous Right    07/10/2014   Lower Extremities DVT Study   Demographics    Patient Name      Andrew [name]    Date of Study     07/09/2014          Gender              Male    Patient Number    [id number]          Date of Birth       05-07-1940    Visit Number      [id number]         Age                 6 year(s)    Accession Number  [id number]            Room Number         5366    Corporate ID      [id number]            [place], [place],                                                            [place]    Ordering          Deniece Portela,   Interpreting  [facility]  Physician         MD                  Physician           Moses Manners,                                                            MD, [facility], [facility]   Procedure  Type of Study:    Veins:Lower Extremities DVT Study, VL EXTREMITY VENOUS DUPLEX RIGHT.    Generic Orders:VL EXTREMITY VENOUS DUPLEX RIGHT.    Vascular Sonographer Report   Additional Indications:Swelling  Impressions Right Impression Acute totally occluding deep vein thrombosis involving the right proximal PTV and one gastroc vein zone 5-6. Acute partially occluding deep vein thrombosis involving the popliteal vein and right one gastroc vein zone 5-6. No other evidence of deep vein or superficial vein thrombosis involving the right lower extremity and the left common femoral vein. Calf veins were not well visualized on the right due to patient positioning and edema.  Verbal to [employer].  Conclusions    Summary    Acute totally occluding deep vein thrombosis involving the right proximal  PTV and one gastroc vein zone 5-6.  Acute partially occluding deep vein thrombosis involving the popliteal vein  and right one gastroc vein zone 5-6.  Calf veins were not well visualized on the right due to patient positioning  and edema.    Signature    ------------------------------------------------------------------  Electronically signed by Moses Manners, MD, [facility], [facility]  (Interpreting physician) on 07/10/2014 at 11:34 AM  ------------------------------------------------------------------   Patient Status:Routine. Study Location:[facility] - Vascular Lab. Technical Quality:Poor visualization.  Risk Factors History +------------------+----------+----------------------------------------------+ !Diagnosis          !Date      !Comments                                      ! +------------------+----------+----------------------------------------------+ !Previous Procedure!06/27/2014!Venous Doppler Bilateral: Acute DVT right     ! !                  !          !gastroc veins.                                ! +------------------+----------+----------------------------------------------+  Velocities are measured in cm/s ; Diameters are measured in mm  Right Lower Extremities DVT Study Measurements Right 2D Measurements +------------------------+----------+---------------+----------+ !Location                !Visualized!Compressibility!Thrombosis! +------------------------+----------+---------------+----------+ !Sapheno Femoral Junction!Yes       !Yes            !None      ! +------------------------+----------+---------------+----------+ !GSV Thigh               !Yes       !Yes            !None      ! +------------------------+----------+---------------+----------+ !Common Femoral          !Yes       !Yes            !  None      ! +------------------------+----------+---------------+----------+ !Prox Femoral            !Yes       !Yes            !None      ! +------------------------+----------+---------------+----------+ !Mid Femoral             !Yes       !Yes            !None      ! +------------------------+----------+---------------+----------+ !Dist Femoral            !Yes       !Yes            !None      ! +------------------------+----------+---------------+----------+ !Deep Femoral            !Yes       !Yes            !None      ! +------------------------+----------+---------------+----------+ !Popliteal               !Yes       !Partial        !Acute     ! +------------------------+----------+---------------+----------+ !GSV Below Knee          !Yes       !Yes            !None      ! +------------------------+----------+---------------+----------+ !Gastroc                 !Yes       !Partial        !Acute     !  +------------------------+----------+---------------+----------+ !PTV                     !Yes       !No             !Acute     ! +------------------------+----------+---------------+----------+ !Peroneal                !Yes       !Yes            !None      ! +------------------------+----------+---------------+----------+ !SSV                     !Yes       !Yes            !None      ! +------------------------+----------+---------------+----------+  Right Doppler Measurements +--------------+------+------+------------+ !Location      !Signal!Reflux!Reflux (sec)! +--------------+------+------+------------+ !Common Femoral!Phasic!      !            ! +--------------+------+------+------------+ !Femoral       !Phasic!      !            ! +--------------+------+------+------------+ !Deep Femoral  !Phasic!      !            ! +--------------+------+------+------------+ !Popliteal     !Phasic!      !            ! +--------------+------+------+------------+  Left Lower Extremities DVT Study Measurements Left 2D Measurements +------------------------+----------+---------------+----------+ !Location                !Visualized!Compressibility!Thrombosis! +------------------------+----------+---------------+----------+ !Sapheno Femoral Junction!Yes       !Yes            !None      ! +------------------------+----------+---------------+----------+ !Common Femoral          !  Yes       !Yes            !None      ! +------------------------+----------+---------------+----------+  Left Doppler Measurements +--------------+------+------+------------+ !Location      !Signal!Reflux!Reflux (sec)! +--------------+------+------+------------+ !Common Femoral!Phasic!      !            ! +--------------+------+------+------------+    Vl Extremity Venous Bilateral    07/09/2014   Vascular Lower Extremities DVT Study Procedure  -- PRELIMINARY SONOGRAPHER REPORT --    Demographics    Patient Name      Andrew [name]    Date of Study      07/09/2014          Gender              Male    Patient Number    [id number]          Date of Birth       [birth year]    Visit Number      [id number]         Age                 62 year(s)    Accession Number  [id number]           Room Number         5784    Corporate ID      [id number]            Sonographer         Erasmo Leventhal,                                                            [facility]    Ordering          Deniece Portela,   Interpreting        [facility]  Physician         MD                  Physician   Procedure  Type of Study:    Veins:Lower Extremities DVT Study, VL EXTREMITY VENOUS DUPLEX RIGHT.   Tech Comments Right Acute totally occluding deep vein thrombosis involving the right proximal PTV and one gastroc vein zone 5-6. Acute partially occluding deep vein thrombosis involving the popliteal vein and right one gastroc vein zone 5-6. No other evidence of deep vein or superficial vein thrombosis involving the right lower extremity and the left common femoral vein. Calf veins were not well visualized on the right due to patient positioning and edema.  verbal to [employer].      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

## 2014-08-05 NOTE — Plan of Care (Signed)
Problem: Risk for Impaired Skin Integrity  Goal: Tissue integrity - skin and mucous membranes  Structural intactness and normal physiological function of skin and  mucous membranes.   Intervention: SKIN ASSESSMENT  No areas of breakdown. Turn every 2 hours, keep heels elevated off bed.

## 2014-08-05 NOTE — Progress Notes (Addendum)
[facility]cology [facility] Hematology Care   Progress Note    Subjective:  Drowsy, arousable but really not answering questions, wife states he was more alert and active earlier today.    Objective:  Medications:  ??? furosemide  20 mg Intravenous Daily   ??? sodium chloride  250 mL Intravenous Once   ??? sodium chloride flush  10 mL Intravenous 2 times per day   ??? sodium chloride  250 mL Intravenous Once   ??? lactulose  20 g Oral TID   ??? amoxicillin-clavulanate  1 tablet Oral 2 times per day   ??? carvedilol  6.25 mg Oral BID WC   ??? vitamin D  1,000 Units Oral Daily   ??? citalopram  10 mg Oral Daily   ??? eplerenone  50 mg Oral Daily   ??? furosemide  40 mg Oral Daily   ??? lisinopril  5 mg Oral Nightly   ??? magnesium oxide  400 mg Oral Daily   ??? rifaximin  550 mg Oral BID   ??? sodium chloride flush  10 mL Intravenous 2 times per day        Physical Exam:  Vitals:  BP 103/66 mmHg   Pulse 79   Temp(Src) 98.4 ??F (36.9 ??C) (Oral)   Resp 16   Ht 5\' 5"  (1.651 m)   Wt 194 lb 3.2 oz (88.089 kg)   BMI 32.32 kg/m2   SpO2 91%    General:  Awakens, drowsy, chronically-ill appearing  HEENT: PERRL, no icterus  Lymph: No cervical, supraclavicular LAD  Resp: Rhonchi in B/L lower lung fields, tachypnea  Cardiovascular: RRR, no MRG  GI: Soft, mildly distended, +BS  Skin: No rash, no jaundice  Psych: + confusion, no agitation    Labs Results:    CBC:   Recent Labs      08/03/14   0621  08/04/14   0619  08/05/14   0638   WBC  1.9*  2.1*  2.0*   HGB  7.6*  7.5*  7.6*   HCT  22.8*  22.9*  22.7*   MCV  99.5  100.9*  99.2   PLT  50*  56*  49*     BMP:   Recent Labs      08/03/14   0622  08/04/14   0619  08/05/14   0638   NA  135*  135*  138   K  4.8  5.1  5.2*   CL  106  108  108   CO2  23  22  24    BUN  21*  19  21*   CREATININE  0.6*  0.7*  0.7*     LIVER PROFILE:   Recent Labs      08/05/14   0638   AST  29   ALT  15   BILIDIR  0.5*   BILITOT  1.8*   ALKPHOS  70     PT/INR:    Lab Results   Component Value Date    PROTIME 15.2 08/01/2014    PROTIME 15.7 07/25/2014     PROTIME 14.7 07/07/2014    INR 1.33 08/01/2014    INR 1.37 07/25/2014    INR 1.28 07/07/2014    INR 1.17 05/14/2010    INR 1.18 02/13/2010    INR 1.15 10/17/2009     PTT:    Lab Results   Component Value Date    APTT 33.5 08/01/2014    APTT 46.5 07/25/2014    APTT  29.7 10/17/2009       Investigations Reviewed:    CXR:     In this patient with cardiomegaly, new right pleural effusion and   ground-glass attenuation in the right lung base may represent developing   pulmonary edema. Atelectasis or developing pneumonia cannot be excluded.       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 Hgb stable today, cont to observe.  ESA 5/28.    Neutropenia - ANC: 600, w/w underlying MDS and HSM.  Monitor for now, if spikes fever will need neutropenic dosing.    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  Stable, no bleeding.    DVT - IVC filter.     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  Rifaximin, lactulose.    I have discussed the above stated plan with the patient and they verbalized understanding and agreed with the plan. Thank you for allowing [place] to participate in this patients care.    Janus Molder, MD  Hematology/Oncology  [phone]

## 2014-08-06 LAB — CBC WITH AUTO DIFFERENTIAL
Basophils %: 1.6 %
Basophils Absolute: 0 10*3/uL (ref 0.0–0.2)
Eosinophils %: 1.6 %
Eosinophils Absolute: 0 10*3/uL (ref 0.0–0.6)
Hematocrit: 24.2 % — ABNORMAL LOW (ref 40.5–52.5)
Hemoglobin: 8 g/dL — ABNORMAL LOW (ref 13.5–17.5)
Lymphocytes %: 46.5 %
Lymphocytes Absolute: 1.1 10*3/uL (ref 1.0–5.1)
MCH: 33.5 pg (ref 26.0–34.0)
MCHC: 33.1 g/dL (ref 31.0–36.0)
MCV: 101.3 fL — ABNORMAL HIGH (ref 80.0–100.0)
MPV: 9.4 fL (ref 5.0–10.5)
Monocytes %: 14.2 %
Monocytes Absolute: 0.3 10*3/uL (ref 0.0–1.3)
Neutrophils %: 36.1 %
Neutrophils Absolute: 0.9 10*3/uL — CL (ref 1.7–7.7)
Platelets: 56 10*3/uL — ABNORMAL LOW (ref 135–450)
RBC: 2.39 M/uL — ABNORMAL LOW (ref 4.20–5.90)
RDW: 17 % — ABNORMAL HIGH (ref 12.4–15.4)
WBC: 2.4 10*3/uL — ABNORMAL LOW (ref 4.0–11.0)

## 2014-08-06 LAB — BASIC METABOLIC PANEL
Anion Gap: 5 (ref 3–16)
BUN: 22 mg/dL — ABNORMAL HIGH (ref 7–20)
CO2: 25 mmol/L (ref 21–32)
Calcium: 8.6 mg/dL (ref 8.3–10.6)
Chloride: 108 mmol/L (ref 99–110)
Creatinine: 0.6 mg/dL — ABNORMAL LOW (ref 0.8–1.3)
GFR African American: >60 (ref >60)
GFR Non-African American: >60 (ref >60)
Glucose: 90 mg/dL (ref 70–99)
Potassium: 4.9 mmol/L (ref 3.5–5.1)
Sodium: 138 mmol/L (ref 136–145)

## 2014-08-06 MED ORDER — AMOXICILLIN-POT CLAVULANATE 875-125 MG PO TABS
875-125 MG | ORAL_TABLET | Freq: Two times a day (BID) | ORAL | Status: AC
Start: 2014-08-06 — End: 2014-08-11

## 2014-08-06 MED FILL — EPLERENONE 25 MG PO TABS: 25 MG | ORAL | Qty: 2

## 2014-08-06 MED FILL — TRAMADOL HCL 50 MG PO TABS: 50 MG | ORAL | Qty: 1

## 2014-08-06 MED FILL — AMOXICILLIN-POT CLAVULANATE 875-125 MG PO TABS: 875-125 MG | ORAL | Qty: 1

## 2014-08-06 MED FILL — FUROSEMIDE 40 MG PO TABS: 40 MG | ORAL | Qty: 1

## 2014-08-06 MED FILL — XIFAXAN 550 MG PO TABS: 550 MG | ORAL | Qty: 1

## 2014-08-06 MED FILL — NORMAL SALINE FLUSH 0.9 % IV SOLN: 0.9 % | INTRAVENOUS | Qty: 10

## 2014-08-06 MED FILL — CITALOPRAM HYDROBROMIDE 20 MG PO TABS: 20 MG | ORAL | Qty: 1

## 2014-08-06 MED FILL — CARVEDILOL 6.25 MG PO TABS: 6.25 MG | ORAL | Qty: 1

## 2014-08-06 MED FILL — VITAMIN D3 25 MCG (1000 UT) PO TABS: 25 MCG (1000 UT) | ORAL | Qty: 1

## 2014-08-06 MED FILL — LISINOPRIL 5 MG PO TABS: 5 MG | ORAL | Qty: 1

## 2014-08-06 MED FILL — MAGNESIUM OXIDE 400 (241.3 MG) MG PO TABS: 400 (241.3 Mg) MG | ORAL | Qty: 1

## 2014-08-06 NOTE — Discharge Summary (Signed)
Data- discharge order received, pt (appointed legal authority) verbalized agreement to discharge, disposition to [place], [place] reviewed and signed by physician.    Action- AVS prepared, COC completed/ reported faxed by case management/social services. Discharge instruction summary: Diet- dysphagia 3, Activity- as tolerated, immunizations reviewed , medications prescriptions to be filled at receiving facility except for the controlled prescriptions to be sent: n/a, Transfer code status: Full Code, LDAs to remain with discharge: none.   DME used: none.     Response- Bedside RN to call report to receiving facility. Pt belongings gathered, peripheral IV and cardiac monitoring removed. Disposition to Discharged ambulatory to skilled nursing by PCA, no complications reported.

## 2014-08-06 NOTE — Care Coordination-Inpatient (Addendum)
Called  to [facility], [phone],  [phone] to arrange discharge today pending [employer] precert.  Therapy was requested to update notes as a priority today.  RN, Please complete the COC/AVS. Thank you ! Briant Sites

## 2014-08-06 NOTE — Oncology Nurse Navigation (Signed)
Oncology and Hematology Care   Progress Note    08/06/2014    SUBJECTIVE:patient appears alert this morning. He has not had fevers He has had no further bleeding    OBJECTIVE:    Physical Assessment:  Vitals:  BP 104/65 mmHg   Pulse 83   Temp(Src) 96.8 ??F (36 ??C) (Temporal)   Resp 16   Ht 5\' 5"  (1.651 m)   Wt 194 lb 3.2 oz (88.089 kg)   BMI 32.32 kg/m2   SpO2 92%   24HR INTAKE/OUTPUT:    Intake/Output Summary (Last 24 hours) at 08/06/14 0738  Last data filed at 08/05/14 1700   Gross per 24 hour   Intake    720 ml   Output    200 ml   Net    520 ml       CONSTITUTIONAL:  Awake, alert & oriented x3  HEENT: PERRL, Neck: soft, supple, no cervical, supraclavicular or axillary adenopathy  RESPIRATORY:  No increased work of breathing, breath sounds decreased.  CARDIOVASCULAR:  Cardiac S1/S2, RRR  GASTROINTESTINAL:  abdomen soft +BS x4, no hepatosplenomegaly  SKIN:  negative for rash and skin lesion(s)  MUSCULOSKELETAL:  negative for pain and muscle weakness  EXTREMITIES:  2-3+ edema of lower extremities    Labs Results:    CBC: Recent Labs      08/04/14   0619  08/05/14   0638  08/06/14   0518   WBC  2.1*  2.0*  2.4*   HGB  7.5*  7.6*  8.0*   HCT  22.9*  22.7*  24.2*   MCV  100.9*  99.2  101.3*   PLT  56*  49*  56*     BMP: Recent Labs      08/04/14   0619  08/05/14   0638  08/06/14   0518   NA  135*  138  138   K  5.1  5.2*  4.9   CL  108  108  108   CO2  22  24  25    BUN  19  21*  22*   CREATININE  0.7*  0.7*  0.6*     LIVER PROFILE:   Recent Labs      08/05/14   0638   AST  29   ALT  15   BILIDIR  0.5*   BILITOT  1.8*   ALKPHOS  70     PT/INR:    Lab Results   Component Value Date    PROTIME 15.2 08/01/2014    PROTIME 15.7 07/25/2014    PROTIME 14.7 07/07/2014    INR 1.33 08/01/2014    INR 1.37 07/25/2014    INR 1.28 07/07/2014    INR 1.17 05/14/2010    INR 1.18 02/13/2010    INR 1.15 10/17/2009     PTT:    Lab Results   Component Value Date    APTT 33.5 08/01/2014    APTT 46.5 07/25/2014    APTT 29.7 10/17/2009      UA:No results for input(s): NITRITE, COLORU, PHUR, LABCAST, WBCUA, RBCUA, MUCUS, TRICHOMONAS, YEAST, BACTERIA, CLARITYU, SPECGRAV, LEUKOCYTESUR, UROBILINOGEN, BILIRUBINUR, BLOODU, GLUCOSEU, AMORPHOUS in the last 72 hours.    Invalid input(s): KETONESU  Magnesium:   Lab Results   Component Value Date    MG 1.50 01/14/2014    MG 1.70 01/10/2014    MG 2.0 11/19/2011     ANA:  Lab Results   Component Value Date  ANATITER Negative at <1-80 10/17/2009    ANA POSITIVE at 1-40 10/17/2009       RADIOLOGY:    Xr Chest Standard Two Vw    08/04/2014   EXAMINATION: TWO VIEWS OF THE CHEST  08/04/2014 12:58 pm  COMPARISON: 07/27/2014 radiograph  HISTORY: ORDERING SYSTEM PROVIDED HISTORY: cough TECHNOLOGIST PROVIDED HISTORY: Ordering Physician Provided Reason for Exam: cough Acuity: Acute Type of Exam: Ongoing  FINDINGS: The heart is enlarged.  Mediastinum is normal.  Mild pulmonary vascular congestion is stable centrally.  Since prior imaging, a small right-sided pleural effusion has developed, and there is moderate ground-glass attenuation in the right lower lung zone.  Persistent mild pleural thickening versus fluid along the lateral aspect of the lower left chest.  No skeletal abnormalities.  Remote compression fracture in the lower thoracic spine, stable.     08/04/2014   IMPRESSION: In this patient with cardiomegaly, new right pleural effusion and ground-glass attenuation in the right lung base may represent developing pulmonary edema.  Atelectasis or developing pneumonia cannot be excluded.     Xr Chest Standard Two Vw    07/27/2014   EXAMINATION: TWO VIEWS OF THE CHEST  07/27/2014 5:52 pm  COMPARISON: 07/25/2014  HISTORY: ORDERING SYSTEM PROVIDED HISTORY: follow up pneumonia TECHNOLOGIST PROVIDED HISTORY: Ordering Physician Provided Reason for Exam: follow up pneumonia Acuity: Acute Type of Exam: Initial  FINDINGS: Compression fracture of the lower thoracic spine is again seen.  Minimal right base atelectasis is noted.   There is scarring on the left.  Remote rib fractures are seen.  Cardiac silhouette within normal limits.  There is a hiatal hernia     07/27/2014   IMPRESSION: No acute disease     Xr Chest Standard Two Vw    07/25/2014   EXAMINATION: TWO VIEWS OF THE CHEST  07/25/2014 8:17 am  COMPARISON: Chest x-ray Jul 07, 2014  HISTORY: ORDERING SYSTEM PROVIDED HISTORY: generalized weakness, hypotension TECHNOLOGIST PROVIDED HISTORY: Ordering Physician Provided Reason for Exam: weakness Acuity: Unknown Type of Exam: Unknown  FINDINGS: There is new focal density of the right upper lobe above the hilum.  There also is new faint airspace disease of the lateral right lung base.  There is new focal opacity of the retrocardiac left lower lobe.  There is blunting of the left costophrenic angle consistent with pleural fluid.     07/25/2014   IMPRESSION: New bilateral multifocal airspace disease, most likely atelectasis or pneumonia although follow-up to ensure resolution is recommended.  New small to moderate size left pleural effusion.     Ct Head Wo Contrast    07/07/2014   EXAMINATION: CT OF THE HEAD WITHOUT CONTRAST  07/07/2014 11:11 pm  TECHNIQUE: CT of the head was performed without the administration of intravenous contrast.  COMPARISON: 04/01/2014  HISTORY: ORDERING SYSTEM PROVIDED HISTORY: dizzy TECHNOLOGIST PROVIDED HISTORY: Ordering Physician Provided Reason for Exam: dizzy  FINDINGS: BRAIN/VENTRICLES: There is no acute intracranial hemorrhage, mass effect or midline shift.  No abnormal extra-axial fluid collection.  The gray-white differentiation is maintained without evidence of an acute infarct.  There is no evidence of hydrocephalus.  ORBITS: The visualized portion of the orbits demonstrate no acute abnormality.  SINUSES: There is minimal mucosal thickening in the left maxillary sinus. The remaining visualized paranasal sinuses are clear.  The mastoid air cells are unremarkable.  SOFT TISSUES/SKULL:  No acute abnormality of the  visualized skull or soft tissues.     07/07/2014   IMPRESSION: No acute intracranial abnormality.  Ct Chest W Contrast    07/26/2014   EXAMINATION: CT OF THE CHEST WITH CONTRAST 07/26/2014 2:14 pm  TECHNIQUE: CT of the chest was performed with the administration of intravenous contrast. Multiplanar reformatted images are provided for review. Dose modulation, iterative reconstruction, and/or weight based adjustment of the mA/kV was utilized to reduce the radiation dose to as low as reasonably achievable.  COMPARISON: 06/27/2014  HISTORY: ORDERING SYSTEM PROVIDED HISTORY: SHORTNESS OF BREATH  FINDINGS: Mediastinum: Stable mediastinal lymph nodes, no convincing adenopathy. Thoracic aorta normal in caliber.  No pericardial effusion.  .  Lungs/pleura: Interval decrease in right pleural effusion.  Stable trace left pleural effusion.  There are multifocal airspace opacities that are predominantly bandlike in morphology, mostly in the lower lobes, right middle lobe, and lingula.  There are a cluster of tiny nodules in the lateral right upper lobe.  There is a stable 1.3 cm nodule in the superior segment of the right lower lobe.  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  There is a tiny nonobstructing right renal stone.  Large venous collaterals are noted adjacent to the spleen  Soft Tissues/Bones: There is bilateral gynecomastia.  There are multiple thyroid nodules     07/26/2014   IMPRESSION: 1. Multifocal airspace opacities in the lower lobes, right middle lobe, and lingula have an appearance compatible with atelectasis, with or without pneumonia.  Cluster of nodular densities in the lateral right upper lobe most likely represents atypical infection.  1.3 cm nodule in the superior segment of the right lower lobe warrants six-month follow-up 2. Interval decrease in right pleural effusion 3. 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  LIVER: Evaluation of the liver is limited.  There is diffuse increased echogenicity with no focal lesion noted.  Somewhat nodular contour of the liver is compatible with known cirrhosis.  BILIARY SYSTEM: Limited imaging of the gallbladder is grossly unremarkable.  Common bile duct is within normal limits measuring 6 mm.  KIDNEYS: Right kidney is nonvisualized.  Left kidney measures 10.1 x 4.9 x 5.3 cm and is grossly unremarkable.  PANCREAS: Pancreas is nonvisualized.  SPLEEN: Spleen measures 10.1 cm.  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  IVC: The IVC is patent.  AORTA: Aorta is obscured.  OTHER: No evidence of ascites.     07/08/2014   IMPRESSION: Limited study as above.  Cirrhosis and portal hypertension.  No evidence of ascites.     Ct Abdomen Pelvis W Iv Contrast Additional Contrast?: Oral    07/30/2014   EXAMINATION: CT OF THE ABDOMEN AND PELVIS WITH CONTRAST 07/30/2014 11:54 am  TECHNIQUE: CT of the abdomen and pelvis was performed with the administration of intravenous contrast. Multiplanar reformatted images are provided for review. Dose modulation, iterative reconstruction, and/or weight based adjustment of the mA/kV was utilized to reduce the radiation dose to as low as reasonably achievable.  COMPARISON: 09/07/2010  HISTORY: ORDERING SYSTEM PROVIDED HISTORY: ABD PAIN, FEVER, NO RECENT SURGERY TECHNOLOGIST PROVIDED HISTORY: Additional Contrast?->Oral Ordering Physician Provided Reason for Exam: ABD PAIN, FEVER, NO RECENT SURGERY Acuity: Unknown Type of Exam: Unknown  Unexplained anemia.  Evaluate for retroperitoneal hemorrhage.  FINDINGS:  Lower Chest: There is mild bibasilar pleural and  parenchymal disease, new from the prior study.  Organs: The liver is shrunken with a nodular contour consistent with cirrhosis.  No focal liver lesion.  The main portal vein is small in size and grossly patent.  There is a recannulized umbilical vein.  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  GI/Bowel: Bowel loops are unremarkable.  The gallbladder is moderately distended.  There are no calcified gallstones.  Pelvis: Urinary bladder, prostate gland and seminal vesicles are unremarkable.  Peritoneum/Retroperitoneum: No adenopathy, free air or free fluid.  Bones/Soft Tissues: There is bilateral flank edema.  There is a large left gluteus muscle mass/hematoma extending into the left upper thigh.     07/30/2014   IMPRESSION: Large left gluteus muscle mass/hematoma likely accounting for the unexplained anemia.  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     Xr Chest Portable    07/08/2014   EXAMINATION: SINGLE VIEW OF THE CHEST  07/07/2014 11:34 pm  COMPARISON: 06/03/2014  HISTORY: ORDERING SYSTEM PROVIDED HISTORY: other TECHNOLOGIST PROVIDED HISTORY: Ordering Physician Provided Reason for Exam: dizziness Acuity: Unknown Type of Exam: Unknown  FINDINGS: Cardiac leads project over the chest.  Diffuse interstitial opacity is demonstrated bilaterally.  Heterogeneous opacity is seen adjacent to the left heart border.  No evidence of pleural effusion.  Negative for pneumothorax. Cardiac and mediastinal silhouettes are similar to prior.  Old posterior left 7th rib fracture.     07/08/2014   IMPRESSION: Mild pulmonary edema.     Vl Extremity Venous Right    07/10/2014   Lower Extremities DVT Study   Demographics    Patient Name      Andrew [name]    Date of Study     07/09/2014          Gender              Male    Patient Number    [id number]          Date of Birth       August 20, 1940    Visit Number      [id number]         Age                 24 year(s)    Accession Number  [id number]            Room Number         9518    Corporate ID      [id number]            Sonographer         Erasmo Leventhal,                                                            RVS    Ordering          Deniece Portela,   Interpreting        [facility]  Physician         MD                  Physician           Peggyann Shoals,  MD, [facility], RPVI   Procedure  Type of Study:    Veins:Lower Extremities DVT Study, VL EXTREMITY VENOUS DUPLEX RIGHT.    Generic Orders:VL EXTREMITY VENOUS DUPLEX RIGHT.    Vascular Sonographer Report   Additional Indications:Swelling  Impressions Right Impression Acute totally occluding deep vein thrombosis involving the right proximal PTV and one gastroc vein zone 5-6. Acute partially occluding deep vein thrombosis involving the popliteal vein and right one gastroc vein zone 5-6. No other evidence of deep vein or superficial vein thrombosis involving the right lower extremity and the left common femoral vein. Calf veins were not well visualized on the right due to patient positioning and edema.  Verbal to [employer].  Conclusions    Summary    Acute totally occluding deep vein thrombosis involving the right proximal  PTV and one gastroc vein zone 5-6.  Acute partially occluding deep vein thrombosis involving the popliteal vein  and right one gastroc vein zone 5-6.  Calf veins were not well visualized on the right due to patient positioning  and edema.    Signature    ------------------------------------------------------------------  Electronically signed by Peggyann Shoals, MD, [facility], RPVI  (Interpreting physician) on 07/10/2014 at 11:34 AM  ------------------------------------------------------------------   Patient Status:Routine. [facility] - Vascular Lab. Technical Quality:Poor visualization.  Risk Factors History +------------------+----------+----------------------------------------------+ !Diagnosis          !Date      !Comments                                      ! +------------------+----------+----------------------------------------------+ !Previous Procedure!06/27/2014!Venous Doppler Bilateral: Acute DVT right     ! !                  !          !gastroc veins.                                ! +------------------+----------+----------------------------------------------+  Velocities are measured in cm/s ; Diameters are measured in mm  Right Lower Extremities DVT Study Measurements Right 2D Measurements +------------------------+----------+---------------+----------+ !Location                !Visualized!Compressibility!Thrombosis! +------------------------+----------+---------------+----------+ !Sapheno Femoral Junction!Yes       !Yes            !None      ! +------------------------+----------+---------------+----------+ !GSV Thigh               !Yes       !Yes            !None      ! +------------------------+----------+---------------+----------+ !Common Femoral          !Yes       !Yes            !None      ! +------------------------+----------+---------------+----------+ !Prox Femoral            !Yes       !Yes            !None      ! +------------------------+----------+---------------+----------+ !Mid Femoral             !Yes       !Yes            !None      ! +------------------------+----------+---------------+----------+ !Dist Femoral            !  Yes       !Yes            !None      ! +------------------------+----------+---------------+----------+ !Deep Femoral            !Yes       !Yes            !None      ! +------------------------+----------+---------------+----------+ !Popliteal               !Yes       !Partial        !Acute     ! +------------------------+----------+---------------+----------+ !GSV Below Knee          !Yes       !Yes            !None      ! +------------------------+----------+---------------+----------+ !Gastroc                 !Yes       !Partial        !Acute     !  +------------------------+----------+---------------+----------+ !PTV                     !Yes       !No             !Acute     ! +------------------------+----------+---------------+----------+ !Peroneal                !Yes       !Yes            !None      ! +------------------------+----------+---------------+----------+ !SSV                     !Yes       !Yes            !None      ! +------------------------+----------+---------------+----------+  Right Doppler Measurements +--------------+------+------+------------+ !Location      !Signal!Reflux!Reflux (sec)! +--------------+------+------+------------+ !Common Femoral!Phasic!      !            ! +--------------+------+------+------------+ !Femoral       !Phasic!      !            ! +--------------+------+------+------------+ !Deep Femoral  !Phasic!      !            ! +--------------+------+------+------------+ !Popliteal     !Phasic!      !            ! +--------------+------+------+------------+  Left Lower Extremities DVT Study Measurements Left 2D Measurements +------------------------+----------+---------------+----------+ !Location                !Visualized!Compressibility!Thrombosis! +------------------------+----------+---------------+----------+ !Sapheno Femoral Junction!Yes       !Yes            !None      ! +------------------------+----------+---------------+----------+ !Common Femoral          !Yes       !Yes            !None      ! +------------------------+----------+---------------+----------+  Left Doppler Measurements +--------------+------+------+------------+ !Location      !Signal!Reflux!Reflux (sec)! +--------------+------+------+------------+ !Common Femoral!Phasic!      !            ! +--------------+------+------+------------+    Vl Extremity Venous Bilateral    07/09/2014   Vascular Lower Extremities DVT Study Procedure  -- PRELIMINARY SONOGRAPHER REPORT --    Demographics    Patient Name  Andrew [name]    Date of Study      07/09/2014          Gender              Male    Patient Number    [id number]          Date of Birth       12/18/1940    Visit Number      [id number]         Age                 28 year(s)    Accession Number  [id number]           Room Number         6295    Corporate ID      [id number]            Sonographer         Erasmo Leventhal,                                                            RVS    Ordering          Deniece Portela,   Interpreting        [facility]  Physician         MD                  Physician   Procedure  Type of Study:    Veins:Lower Extremities DVT Study, VL EXTREMITY VENOUS DUPLEX RIGHT.   Tech Comments Right Acute totally occluding deep vein thrombosis involving the right proximal PTV and one gastroc vein zone 5-6. Acute partially occluding deep vein thrombosis involving the popliteal vein and right one gastroc vein zone 5-6. No other evidence of deep vein or superficial vein thrombosis involving the right lower extremity and the left common femoral vein. Calf veins were not well visualized on the right due to patient positioning and edema.  verbal to [employer].      Current Medications  ??? furosemide  20 mg Intravenous Daily   ??? sodium chloride  250 mL Intravenous Once   ??? sodium chloride flush  10 mL Intravenous 2 times per day   ??? sodium chloride  250 mL Intravenous Once   ??? lactulose  20 g Oral TID   ??? amoxicillin-clavulanate  1 tablet Oral 2 times per day   ??? carvedilol  6.25 mg Oral BID WC   ??? vitamin D  1,000 Units Oral Daily   ??? citalopram  10 mg Oral Daily   ??? eplerenone  50 mg Oral Daily   ??? furosemide  40 mg Oral Daily   ??? lisinopril  5 mg Oral Nightly   ??? magnesium oxide  400 mg Oral Daily   ??? rifaximin  550 mg Oral BID   ??? sodium chloride flush  10 mL Intravenous 2 times per day        ASSESSMENT & PLAN:  Myelodysplasia neutropenia anemia thrombocytopenia. Started epo agent last week. Could consider addition of neupogen if counts deteriorate or he develops fever  Cirrhosis with  hepatic encephalopathy will continue lactulose and rifaxamin  Gluteal bleed hemoglobin has stabilized. Will continue observation  dvt -has had placement of ivc filter  and appears stable at this time. Not a candidate for ongoing anticoagulation therapy  Alpha 1 antitrypsin deficiency  If discharged to rehab should follow up with Dr. Al Corpus next week in office to continue epo agent. At present do not think patient is appropriate for aggressive treatment for his myelodysplasia but may benefit from epo agents and possibly G=csf        I have discussed the above stated plan with the patient and they verbalized understanding and agreed with the plan. Thank you for allowing [place] to participate in this patients care.    Darlyn Read, MD, 08/06/2014, 7:38 AM

## 2014-08-06 NOTE — Progress Notes (Signed)
Patient alert and oriented x4, patient denies SOB. Shift assessment completed. Patient denies additional needs. Call light is in reach and bed alarm is engaged. Will continue to monitor.

## 2014-08-06 NOTE — Progress Notes (Signed)
Occupational Therapy  Daily Treatment Note  Date: 08/06/2014    Patient Name: Andrew [name]  IDP:[id number]     DOB: 1941-01-13     Restrictions  Restrictions/Precautions  Restrictions/Precautions: Fall Risk (high)  Required Braces or Orthoses?: No  Position Activity Restriction  Other position/activity restrictions: Nurse approved prior to therapy. IVC filter placed 07/31/14  Subjective   General  Chart Reviewed: Yes  Additional Pertinent Hx: HTN, kidney stones, cirrhosis, leukopenia, anemia, h/o DVT, cancer, glaucoma, liver disease, myelodysplasia, bone marrow biopsy  Response to previous treatment: Patient with no complaints from previous session  Family / Caregiver Present: Yes (wife)  Diagnosis: Pneumonia  Subjective  Subjective: Patient seated in chair, agreeable to therapy.   Pain Assessment  Patient Currently in Pain: Yes  Pain Level: 6  Pain Location: Hip;Leg (R and L hip, R and L legs)  Pain Orientation: Right;Left  Pain Descriptors: Sore  Vital Signs  Patient Currently in Pain: Yes  Oxygen Therapy  SpO2: 94 %  Pulse Oximeter Device Mode: Intermittent  Pulse Oximeter Device Location: Right;Finger  Patient Observation  Observations: Pt. observed breathing heavily while in stance at sink. After seated, O2 was 94%.   Orientation  Orientation  Overall Orientation Status: Within Functional Limits (Increase time for responses)  Objective    ADL  Grooming: Contact guard assistance;Setup;Verbal cueing;Increased time to complete (brushing teeth in stance at sink.)  LE Dressing: Moderate assistance (Mod A for doff/don socks secondary to hip pain. Pt. reported that wife helps.)  Transfer: Contact guard assistance (on/off standard toilet, use of grab bar)  Additional Comments: Pt. seated in recliner upon arrival. Pt's wife present at beginning and end of session. CGA with RW sit <> stand from recliner. CGA with RW to/from bathroom. Pt. observed with forward flexion and delayed step with R foot. Pt. required verbal cues  for maintaining posture. Pt. CGA for oral care in stance at sink. Pt's wife setup toothpaste onto toothbrush.  Pt very slow/perseverated (?) with brushing teeth as evidenced by, pt slowly brushing on one side of mouth then alternating to other side. Pt observed brushing both sides of mouth 3-4 times. Pt required verbal cues to terminate task.  Pt. observed with R lateral lean and maintaining stance by holding onto sink. Pt. required multiple verbal cues to keep head in midline while in stance at sink. Pt. required increased time throughout session for verbal responses.CGA with verbal cues sit <> stand from  standard toilet. Pt c/o pain in both hips. CGA stand to sit in chair. Pt. A&O to person, place, month. Pt. ModA don/doff socks secondary to hip pain. Pt. stated that his wife helps. Pt. left in chair with alarm in place and wife present.  Balance  Sitting Balance: Supervision  Standing Balance: Contact guard assistance  Standing Balance  Time: ~8 minutes  Activity: CGA with RW to/from bathroom, static stance for oral care  Sit to stand: Contact guard assistance (with RW)  Stand to sit: Contact guard assistance (with RW)  Functional Mobility  Functional - Mobility Device: Rolling Walker  Assist Level: Contact guard assistance  Functional Mobility Comments: Pt. required verbal cues for posture, to keep shoulders up and look forward. Pt. CGA walking to/from bathroom, observed delayed step with R foot. Pt. stated he could not see his foot because of the hospital gown.  Toilet Transfers  Equipment Used: Standard toilet  Transfer Level: Pharmacist, community Transfers Comments: Pt declined need for toileting, but demonstrated ability with RW  and verbal cue for grab.  Transfers  Sit to stand: Contact guard assistance (with RW)  Stand to sit: Contact guard assistance (with RW)  Cognition  Overall Cognitive Status: Impaired  Arousal/Alertness: Delayed responses to stimuli  Attention Span: Difficulty attending  to directions  Following Commands: Follows one step commands with increased time  Safety Judgement: Decreased awareness of need for assistance  Awareness of Errors: Assistance required to correct errors made  Insights: Decreased awareness of deficits  Additional Comments: Pt. displayed throughout session delayed processing. Required additional time to respond. Pt observed attempting to sit-stand from standard toilet with use of RW.  Functional activity tolerance: Pt observed with Fair activity tolerance, limited by cognition.  Assessment   Assessment: Decreased functional mobility ;Decreased functional activity tolerance;Decreased ADL status;Decreased cognition;Decreased safe judgement   Comments: Patient is not at baseline level following admission due to pneumonia. Palliative care following, plan is for patient to go to [facility] for SNF.   Treatment Diagnosis: Debility and decreased ADLs following pneumonia  Rehab Potential: Guarded;Good  Recommendations: SNF consult;Patient would benefit from continued therapy after discharge  Requires OT Follow Up: Yes  Total Treatment Time: 49  Timed Code Treatment Minutes: 49 Minutes  Response to Treatment  Response to Treatment: Patient Tolerated treatment well  Safety Devices  Safety Devices in place: Yes  Type of devices: Patient at risk for falls;All fall risk precautions in place;Left in chair;Call light within reach;Nurse notified;Chair alarm in place;Gait belt  OT D/C Equipment  Equipment Needed: Yes  Other: Recommend a RW and shower chair          Discharge Recommendations:  SNF consult, Patient would benefit from continued therapy after discharge     Plan   Plan  Times per week: 2-5  Times per day: Daily  Patient Education: POC, functional mobility, ADLs, bed mobility, walker management and safety, transfers, safety,- pt demonstrated understanding       Short term goals  Time Frame for Short term goals: until discharge  Short term goal 1: Min assist bathing- Not  performed this session. Performed prior to arrival.  Short term goal 2: Min assist dressing- Mod assist to doff/don socks.   Short term goal 3: CGA funtional mobility with RW or with 1 hand held assist--- CGA with RW and verbal cues (GOAL MET 5/31)  Short term goal 4: CGA toileting-  not addressed  Long term goals  Time Frame for Long term goals : STGs= LTGs       Baker Pierini, OTA  License and Documentation Cosign  Therapy License Number: John Ashcraft S/OTA  I have observed, directed the above treatment, & agree with the above note. Baker Pierini COTA/L [phone]  I have reviewed and agree with the above treatment.  Thank you, Dyanne Iha, OTR/L, [place], [phone].

## 2014-08-06 NOTE — Care Coordination-Inpatient (Addendum)
Notified [facility] [phone], of therapy notes available in chart for precert, and precert was approved.  Patient's wife requested to transport patient by car.  COC form is signed.  HENS submitted.  COC form and AVS were faxed.

## 2014-08-06 NOTE — Progress Notes (Signed)
Progress Note - Dr. Tasia Catchings - Internal Medicine  PCP: Alecia Lemming, DO [place] [address] [place] [address] [phone]    Hospital Day: 12  Code Status: Full Code  Current Diet: DIET GENERAL; No Added Salt (3-4 GM)        CC: follow up on medical issues    Subjective:   Andrew [name] is a 74 y.o. male.    He denies problems    Counts stable  D/w dr Liston Alba - not much further inpatient workup    He denies chest pain, denies shortness of breath, denies nausea,  denies emesis.    10 system Review of Systems is reviewed with patient, and pertinent positives are listed here: None . Otherwise, Review of systems is negative.     I have reviewed the patient's medical and social history in detail and updated the computerized patient record.  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 He  has past surgical history that includes AV fistula repair (1970's); Colonoscopy (4/12); Cardiac catheterization; eye surgery (04/12/2014); Cosmetic surgery (04/2013); bone marrow biopsy; and knee surgery.[name] He  reports that he has never smoked. He has never used smokeless tobacco. He reports that he does not drink alcohol or use illicit drugs..        Active Hospital Problems    Diagnosis Date Noted   ??? Hematoma [T14.8] 07/31/2014   ??? Pneumonia of both lungs due to infectious organism [J18.9]    ??? Traumatic hematoma of buttock [S30.0XXA]    ??? Pneumonia [J18.9] 07/25/2014   ??? 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 DVT (deep venous  thrombosis) (HCC) [I82.409] 07/10/2014   ??? Hepatic encephalopathy (HCC) [K72.90] 07/07/2014   ??? Benign essential HTN [I10] 07/03/2014       Current facility-administered medications: furosemide (LASIX) injection 20 mg, 20 mg, Intravenous, Daily  0.9 % sodium chloride bolus, 250 mL, Intravenous, Once  sodium chloride flush 0.9 % injection 10 mL, 10 mL, Intravenous, 2 times per day  sodium chloride flush 0.9 % injection 10 mL, 10 mL, Intravenous, PRN  acetaminophen (TYLENOL) tablet 650 mg, 650 mg, Oral, Q4H PRN  magnesium hydroxide (MILK OF MAGNESIA) 400 MG/5ML suspension 30 mL, 30 mL, Oral, Daily PRN  ondansetron (ZOFRAN) injection 4 mg, 4 mg, Intravenous, Q6H PRN  0.9 % sodium chloride bolus, 250 mL, Intravenous, Once  lactulose (CHRONULAC) 10 GM/15ML solution 20 g, 20 g, Oral, TID  amoxicillin-clavulanate (AUGMENTIN) 875-125 MG per tablet 1 tablet, 1 tablet, Oral, 2 times per day  ioversol (OPTIRAY) 68 % injection 100 mL, 100 mL, Intravenous, ONCE PRN  sodium chloride (PF) 0.9 % injection 10 mL, 10 mL, Intravenous, PRN  carvedilol (COREG) tablet 6.25 mg, 6.25 mg, Oral, BID WC  vitamin D (CHOLECALCIFEROL) tablet 1,000 Units, 1,000 Units, Oral, Daily  citalopram (CELEXA) tablet 10 mg, 10 mg, Oral, Daily  eplerenone (INSPRA) tablet 50 mg, 50 mg, Oral, Daily  furosemide (LASIX) tablet 40 mg, 40 mg, Oral, Daily  lisinopril (PRINIVIL;ZESTRIL) tablet 5 mg, 5 mg, Oral, Nightly  magnesium oxide (MAG-OX) tablet 400 mg, 400 mg, Oral, Daily  rifaximin (XIFAXAN) tablet 550 mg, 550 mg, Oral, BID  traMADol (ULTRAM) tablet 50 mg, 50 mg, Oral, Q12H PRN  sodium chloride flush 0.9 % injection 10 mL, 10 mL, Intravenous, 2 times per day  sodium chloride flush 0.9 % injection 10 mL, 10 mL, Intravenous, PRN  acetaminophen (TYLENOL) tablet 650 mg, 650 mg, Oral, Q4H PRN  HYDROcodone-acetaminophen (NORCO) 5-325 MG per tablet 1 tablet, 1 tablet, Oral, Q4H PRN **OR** HYDROcodone-acetaminophen (NORCO) 5-325 MG per tablet 2 tablet, 2 tablet,  Oral, Q4H PRN  morphine (PF) injection 2 mg, 2 mg, Intravenous, Q2H PRN **OR** morphine (PF) injection 4 mg, 4 mg, Intravenous, Q2H PRN  magnesium hydroxide (MILK OF MAGNESIA) 400 MG/5ML suspension 30 mL, 30 mL, Oral, Daily PRN  ondansetron (ZOFRAN) injection 4 mg, 4 mg, Intravenous, Q6H PRN  potassium chloride SA (K-DUR;KLOR-CON M) tablet 40 mEq, 40 mEq, Oral, PRN **OR** potassium chloride 20 MEQ/15ML (10%) oral solution 40 mEq, 40 mEq, Oral, PRN **OR** potassium chloride 10 mEq/100 mL IVPB (Peripheral Line), 10 mEq, Intravenous, PRN         Objective:  BP 104/65 mmHg   Pulse 83   Temp(Src) 96.8 ??F (36 ??C) (Temporal)   Resp 16   Ht $R'5\' 5"'Kh$  (1.651 m)   Wt 194 lb 3.2 oz (88.089 kg)   BMI 32.32 kg/m2   SpO2 92%     Patient Vitals for the past 24 hrs:   BP Temp Temp src Pulse Resp SpO2 Weight   08/06/14 0458 104/65 mmHg 96.8 ??F (36 ??C) Temporal 83 16 - -   08/06/14 0445 - - - - - 92 % -   08/05/14 2342 101/63 mmHg 98.3 ??F (36.8 ??C) Temporal 72 16 91 % -   08/05/14 2148 101/62 mmHg 97.6 ??F (36.4 ??C) Temporal 70 16 90 % -   08/05/14 1900 100/64 mmHg - - 76 16 92 % -   08/05/14 1700 (!) 87/54 mmHg 98.2 ??F (36.8 ??C) Temporal 74 16 93 % -   08/05/14 1130 103/66 mmHg - Oral 79 16 91 % -   08/05/14 1015 - - - - - 93 % -   08/05/14 0945 - - - - - 92 % -   08/05/14 0813 95/58 mmHg 98.4 ??F (36.9 ??C) Oral 83 16 94 % 194 lb 3.2 oz (88.089 kg)     Patient Vitals for the past 96 hrs (Last 3 readings):   Weight   08/05/14 0813 194 lb 3.2 oz (88.089 kg)   08/04/14 0725 194 lb 1.6 oz (88.043 kg)   08/04/14 0430 193 lb 11.2 oz (87.862 kg)           Intake/Output Summary (Last 24 hours) at 08/06/14 7169  Last data filed at 08/05/14 1700   Gross per 24 hour   Intake    720 ml   Output    200 ml   Net    520 ml         Physical Exam:   S1, S2 normal, no murmur, rub or gallop, regular rate and rhythm  clear to auscultation bilaterally  abdomen is soft without significant tenderness, masses, organomegaly or guarding  extremities normal,  atraumatic, no cyanosis or edema    Labs:  Lab Results   Component Value Date    WBC 2.4* 08/06/2014    HGB 8.0* 08/06/2014    HCT 24.2* 08/06/2014    PLT 56* 08/06/2014    CHOL 101 04/02/2014    TRIG 83 04/02/2014    HDL 22* 04/02/2014    ALT 15  08/05/2014    AST 29 08/05/2014    NA 138 08/06/2014    K 4.9 08/06/2014    CL 108 08/06/2014    CREATININE 0.6* 08/06/2014    BUN 22* 08/06/2014    CO2 25 08/06/2014    TSH 2.15 08/15/2013    PSA 0.55 08/15/2013    INR 1.33* 08/01/2014    LABA1C 5.0 01/10/2014    LABMICR YES 07/25/2014     Lab Results   Component Value Date    TROPONINI <0.01 07/25/2014       Recent Imaging Results are Reviewed:  Xr Chest Standard Two Vw    08/04/2014   EXAMINATION: TWO VIEWS OF THE CHEST  08/04/2014 12:58 pm  COMPARISON: 07/27/2014 radiograph  HISTORY: ORDERING SYSTEM PROVIDED HISTORY: cough TECHNOLOGIST PROVIDED HISTORY: Ordering Physician Provided Reason for Exam: cough Acuity: Acute Type of Exam: Ongoing  FINDINGS: The heart is enlarged.  Mediastinum is normal.  Mild pulmonary vascular congestion is stable centrally.  Since prior imaging, a small right-sided pleural effusion has developed, and there is moderate ground-glass attenuation in the right lower lung zone.  Persistent mild pleural thickening versus fluid along the lateral aspect of the lower left chest.  No skeletal abnormalities.  Remote compression fracture in the lower thoracic spine, stable.     08/04/2014   IMPRESSION: In this patient with cardiomegaly, new right pleural effusion and ground-glass attenuation in the right lung base may represent developing pulmonary edema.  Atelectasis or developing pneumonia cannot be excluded.     Xr Chest Standard Two Vw    07/27/2014   EXAMINATION: TWO VIEWS OF THE CHEST  07/27/2014 5:52 pm  COMPARISON: 07/25/2014  HISTORY: ORDERING SYSTEM PROVIDED HISTORY: follow up pneumonia TECHNOLOGIST PROVIDED HISTORY: Ordering Physician Provided Reason for Exam: follow up pneumonia Acuity: Acute Type  of Exam: Initial  FINDINGS: Compression fracture of the lower thoracic spine is again seen.  Minimal right base atelectasis is noted.  There is scarring on the left.  Remote rib fractures are seen.  Cardiac silhouette within normal limits.  There is a hiatal hernia     07/27/2014   IMPRESSION: No acute disease     Xr Chest Standard Two Vw    07/25/2014   EXAMINATION: TWO VIEWS OF THE CHEST  07/25/2014 8:17 am  COMPARISON: Chest x-ray Jul 07, 2014  HISTORY: ORDERING SYSTEM PROVIDED HISTORY: generalized weakness, hypotension TECHNOLOGIST PROVIDED HISTORY: Ordering Physician Provided Reason for Exam: weakness Acuity: Unknown Type of Exam: Unknown  FINDINGS: There is new focal density of the right upper lobe above the hilum.  There also is new faint airspace disease of the lateral right lung base.  There is new focal opacity of the retrocardiac left lower lobe.  There is blunting of the left costophrenic angle consistent with pleural fluid.     07/25/2014   IMPRESSION: New bilateral multifocal airspace disease, most likely atelectasis or pneumonia although follow-up to ensure resolution is recommended.  New small to moderate size left pleural effusion.     Ct Head Wo Contrast    07/07/2014   EXAMINATION: CT OF THE HEAD WITHOUT CONTRAST  07/07/2014 11:11 pm  TECHNIQUE: CT of the head was performed without the administration of intravenous contrast.  COMPARISON: 04/01/2014  HISTORY: ORDERING SYSTEM PROVIDED HISTORY: dizzy TECHNOLOGIST PROVIDED HISTORY: Ordering Physician Provided Reason for Exam: dizzy  FINDINGS: BRAIN/VENTRICLES: There is no acute intracranial hemorrhage, mass effect or midline shift.  No abnormal extra-axial fluid collection.  The gray-white differentiation is maintained  without evidence of an acute infarct.  There is no evidence of hydrocephalus.  ORBITS: The visualized portion of the orbits demonstrate no acute abnormality.  SINUSES: There is minimal mucosal thickening in the left maxillary sinus. The  remaining visualized paranasal sinuses are clear.  The mastoid air cells are unremarkable.  SOFT TISSUES/SKULL:  No acute abnormality of the visualized skull or soft tissues.     07/07/2014   IMPRESSION: No acute intracranial abnormality.     Ct Chest W Contrast    07/26/2014   EXAMINATION: CT OF THE CHEST WITH CONTRAST 07/26/2014 2:14 pm  TECHNIQUE: CT of the chest was performed with the administration of intravenous contrast. Multiplanar reformatted images are provided for review. Dose modulation, iterative reconstruction, and/or weight based adjustment of the mA/kV was utilized to reduce the radiation dose to as low as reasonably achievable.  COMPARISON: 06/27/2014  HISTORY: ORDERING SYSTEM PROVIDED HISTORY: SHORTNESS OF BREATH  FINDINGS: Mediastinum: Stable mediastinal lymph nodes, no convincing adenopathy. Thoracic aorta normal in caliber.  No pericardial effusion.  .  Lungs/pleura: Interval decrease in right pleural effusion.  Stable trace left pleural effusion.  There are multifocal airspace opacities that are predominantly bandlike in morphology, mostly in the lower lobes, right middle lobe, and lingula.  There are a cluster of tiny nodules in the lateral right upper lobe.  There is a stable 1.3 cm nodule in the superior segment of the right lower lobe.  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  There is a tiny nonobstructing right renal stone.  Large venous collaterals are noted adjacent to the spleen  Soft Tissues/Bones: There is bilateral gynecomastia.  There are multiple thyroid nodules     07/26/2014   IMPRESSION: 1. Multifocal airspace opacities in the lower lobes, right middle lobe, and lingula have an appearance compatible with atelectasis, with or without pneumonia.  Cluster of nodular densities in the lateral right upper lobe most likely represents atypical infection.  1.3 cm nodule in the superior segment of the right lower lobe warrants six-month follow-up 2. Interval decrease in right  pleural effusion 3. 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  LIVER: Evaluation of the liver is limited.  There is diffuse increased echogenicity with no focal lesion noted.  Somewhat nodular contour of the liver is compatible with known cirrhosis.  BILIARY SYSTEM: Limited imaging of the gallbladder is grossly unremarkable.  Common bile duct is within normal limits measuring 6 mm.  KIDNEYS: Right kidney is nonvisualized.  Left kidney measures 10.1 x 4.9 x 5.3 cm and is grossly unremarkable.  PANCREAS: Pancreas is nonvisualized.  SPLEEN: Spleen measures 10.1 cm.  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  IVC: The IVC is patent.  AORTA: Aorta is obscured.  OTHER: No evidence of ascites.     07/08/2014   IMPRESSION: Limited study as above.  Cirrhosis and portal hypertension.  No evidence of ascites.     Ct Abdomen Pelvis W Iv Contrast Additional Contrast?: Oral    07/30/2014   EXAMINATION: CT OF THE ABDOMEN AND PELVIS WITH CONTRAST 07/30/2014 11:54 am  TECHNIQUE: CT of the abdomen and pelvis was performed with the administration  of intravenous contrast. Multiplanar reformatted images are provided for review. Dose modulation, iterative reconstruction, and/or weight based adjustment of the mA/kV was utilized to reduce the radiation dose to as low as reasonably achievable.  COMPARISON: 09/07/2010  HISTORY: ORDERING SYSTEM PROVIDED HISTORY: ABD PAIN, FEVER, NO RECENT SURGERY TECHNOLOGIST PROVIDED HISTORY: Additional Contrast?->Oral Ordering Physician Provided Reason for Exam: ABD PAIN, FEVER, NO RECENT SURGERY Acuity: Unknown Type of Exam:  Unknown  Unexplained anemia.  Evaluate for retroperitoneal hemorrhage.  FINDINGS: Lower Chest: There is mild bibasilar pleural and parenchymal disease, new from the prior study.  Organs: The liver is shrunken with a nodular contour consistent with cirrhosis.  No focal liver lesion.  The main portal vein is small in size and grossly patent.  There is a recannulized umbilical vein.  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  GI/Bowel: Bowel loops are unremarkable.  The gallbladder is moderately distended.  There are no calcified gallstones.  Pelvis: Urinary bladder, prostate gland and seminal vesicles are unremarkable.  Peritoneum/Retroperitoneum: No adenopathy, free air or free fluid.  Bones/Soft Tissues: There is bilateral flank edema.  There is a large left gluteus muscle mass/hematoma extending into the left upper thigh.     07/30/2014   IMPRESSION: Large left gluteus muscle mass/hematoma likely accounting for the unexplained anemia.  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     Xr Chest Portable    07/08/2014   EXAMINATION: SINGLE VIEW OF THE CHEST  07/07/2014 11:34 pm  COMPARISON: 06/03/2014  HISTORY: ORDERING SYSTEM PROVIDED HISTORY: other TECHNOLOGIST PROVIDED HISTORY: Ordering Physician Provided Reason for Exam: dizziness Acuity: Unknown Type of Exam: Unknown  FINDINGS: Cardiac leads project over the chest.  Diffuse interstitial opacity is demonstrated bilaterally.  Heterogeneous opacity is seen adjacent to the left heart border.  No evidence of pleural effusion.  Negative for pneumothorax. Cardiac and mediastinal silhouettes are similar to prior.  Old posterior left 7th rib fracture.     07/08/2014   IMPRESSION: Mild pulmonary edema.     Vl Extremity Venous Right    07/10/2014   Lower Extremities DVT Study   Demographics    Patient Name      Andrew [name]    Date of Study     07/09/2014          Gender              Male    Patient Number    [id number]          Date of  Birth       Jun 10, 1940    Visit Number      [id number]         Age                 40 year(s)    Accession Number  [id number]           Room Number         5560    Corporate ID      [id number]            Sonographer         Elberta Fortis,  RVS    Ordering          Barb Merino,   Interpreting        [facility]  Physician         MD                  Physician           Moses Manners,                                                            MD, [facility], RPVI   Procedure  Type of Study:    Veins:Lower Extremities DVT Study, VL EXTREMITY VENOUS DUPLEX RIGHT.    Generic Orders:VL EXTREMITY VENOUS DUPLEX RIGHT.    Vascular Sonographer Report   Additional Indications:Swelling  Impressions Right Impression Acute totally occluding deep vein thrombosis involving the right proximal PTV and one gastroc vein zone 5-6. Acute partially occluding deep vein thrombosis involving the popliteal vein and right one gastroc vein zone 5-6. No other evidence of deep vein or superficial vein thrombosis involving the right lower extremity and the left common femoral vein. Calf veins were not well visualized on the right due to patient positioning and edema.  Verbal to [employer].  Conclusions    Summary    Acute totally occluding deep vein thrombosis involving the right proximal  PTV and one gastroc vein zone 5-6.  Acute partially occluding deep vein thrombosis involving the popliteal vein  and right one gastroc vein zone 5-6.  Calf veins were not well visualized on the right due to patient positioning  and edema.    Signature    ------------------------------------------------------------------  Electronically signed by Moses Manners, MD, [facility], RPVI  (Interpreting physician) on 07/10/2014 at 11:34 AM  ------------------------------------------------------------------   Patient Status:Routine. Study Location:[facility] - Vascular Lab. Technical Quality:Poor  visualization.  Risk Factors History +------------------+----------+----------------------------------------------+ !Diagnosis         !Date      !Comments                                      ! +------------------+----------+----------------------------------------------+ !Previous Procedure!06/27/2014!Venous Doppler Bilateral: Acute DVT right     ! !                  !          !gastroc veins.                                ! +------------------+----------+----------------------------------------------+  Velocities are measured in cm/s ; Diameters are measured in mm  Right Lower Extremities DVT Study Measurements Right 2D Measurements +------------------------+----------+---------------+----------+ !Location                !Visualized!Compressibility!Thrombosis! +------------------------+----------+---------------+----------+ !Sapheno Femoral Junction!Yes       !Yes            !None      ! +------------------------+----------+---------------+----------+ !GSV Thigh               !Yes       !Yes            !None      ! +------------------------+----------+---------------+----------+ !  Common Femoral          !Yes       !Yes            !None      ! +------------------------+----------+---------------+----------+ !Prox Femoral            !Yes       !Yes            !None      ! +------------------------+----------+---------------+----------+ !Mid Femoral             !Yes       !Yes            !None      ! +------------------------+----------+---------------+----------+ !Dist Femoral            !Yes       !Yes            !None      ! +------------------------+----------+---------------+----------+ !Deep Femoral            !Yes       !Yes            !None      ! +------------------------+----------+---------------+----------+ !Popliteal               !Yes       !Partial        !Acute     ! +------------------------+----------+---------------+----------+ !GSV Below Knee          !Yes       !Yes            !None      !  +------------------------+----------+---------------+----------+ !Gastroc                 !Yes       !Partial        !Acute     ! +------------------------+----------+---------------+----------+ !PTV                     !Yes       !No             !Acute     ! +------------------------+----------+---------------+----------+ !Peroneal                !Yes       !Yes            !None      ! +------------------------+----------+---------------+----------+ !SSV                     !Yes       !Yes            !None      ! +------------------------+----------+---------------+----------+  Right Doppler Measurements +--------------+------+------+------------+ !Location      !Signal!Reflux!Reflux (sec)! +--------------+------+------+------------+ !Common Femoral!Phasic!      !            ! +--------------+------+------+------------+ !Femoral       !Phasic!      !            ! +--------------+------+------+------------+ !Deep Femoral  !Phasic!      !            ! +--------------+------+------+------------+ !Popliteal     !Phasic!      !            ! +--------------+------+------+------------+  Left Lower Extremities DVT Study Measurements Left 2D Measurements +------------------------+----------+---------------+----------+ !Location                !Visualized!Compressibility!Thrombosis! +------------------------+----------+---------------+----------+ !Sapheno Femoral Junction!Yes       !  Yes            !None      ! +------------------------+----------+---------------+----------+ !Common Femoral          !Yes       !Yes            !None      ! +------------------------+----------+---------------+----------+  Left Doppler Measurements +--------------+------+------+------------+ !Location      !Signal!Reflux!Reflux (sec)! +--------------+------+------+------------+ !Common Femoral!Phasic!      !            ! +--------------+------+------+------------+    Vl Extremity Venous Bilateral    07/09/2014   Vascular Lower Extremities DVT  Study Procedure  -- PRELIMINARY SONOGRAPHER REPORT --    Demographics    Patient Name      Andrew [name]    Date of Study     07/09/2014          Gender              Male    Patient Number    [id number]          Date of Birth       [birth year]    Visit Number      [id number]         Age                 58 year(s)    Accession Number  [id number]           Room Number         0160    Corporate ID      [id number]            Sonographer         Erasmo Leventhal,                                                            RVS    Ordering          Deniece Portela,   Interpreting        [facility]  Physician         MD                  Physician   Procedure  Type of Study:    Veins:Lower Extremities DVT Study, VL EXTREMITY VENOUS DUPLEX RIGHT.   Tech Comments Right Acute totally occluding deep vein thrombosis involving the right proximal PTV and one gastroc vein zone 5-6. Acute partially occluding deep vein thrombosis involving the popliteal vein and right one gastroc vein zone 5-6. No other evidence of deep vein or superficial vein thrombosis involving the right lower extremity and the left common femoral vein. Calf veins were not well visualized on the right due to patient positioning and edema.  verbal to [employer].      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

## 2014-08-06 NOTE — Plan of Care (Signed)
Problem: Risk for Impaired Skin Integrity  Goal: Tissue integrity - skin and mucous membranes  Structural intactness and normal physiological function of skin and  mucous membranes.   Outcome: Ongoing  Patient turned q2. Patient get up with assist x's 1 and walker. bruising noted all over right lower extremity.

## 2014-08-06 NOTE — Progress Notes (Signed)
Report called to [facility] spoke with [place].

## 2014-08-06 NOTE — Plan of Care (Signed)
Problem: Discharge Planning:  Intervention: Assess knowledge level of healthcare  PT/OT evaluated pt awaiting precert from [facility].

## 2014-08-06 NOTE — Plan of Care (Signed)
Problem: Discharge Planning:  Intervention: Assess knowledge level of healthcare  Discharge order placed for patient this morning. Awaiting precert to [facility]. Pt agreeable and understands. Will continue to monitor.

## 2014-08-06 NOTE — Progress Notes (Signed)
Physical Therapy  Facility/Department: [facility]  Daily Treatment Note  NAME: Andrew [name]  DOB: 05/05/1940  MRN: [id number]    Date of Service: 08/06/2014    Patient Diagnosis(es):   Patient Active Problem List    Diagnosis Date Noted   ??? Erectile dysfunction 04/30/2009     Priority: Low   ??? Hematoma 07/31/2014   ??? Pneumonia of both lungs due to infectious organism    ??? Traumatic hematoma of buttock    ??? Pneumonia 07/25/2014   ??? 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 DVT (deep venous thrombosis) ([place]) 07/10/2014   ??? Hepatic encephalopathy ([place]) 07/07/2014   ??? Ataxia 07/07/2014   ??? Pancytopenia ([place]) 07/07/2014   ??? Benign essential HTN 07/03/2014   ??? Bronchiectasis without complication ([place]) 27/25/3664   ??? 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 Anemia 04/02/2014   ??? Hemispheric carotid artery syndrome    ??? CAD in native artery    ??? Hyperlipidemia    ??? Abnormal EKG 04/01/2014   ??? Visual changes 04/01/2014   ??? Lumbar compression fracture ([place]) 01/14/2014   ??? Chronic low back pain 01/14/2014   ??? Osteopenia 01/14/2014   ??? Coagulopathy ([place]) 01/14/2014   ??? Non-ischemic cardiomyopathy ([place]) 11/06/2013   ??? Chronic systolic CHF (congestive heart failure) ([place]) 10/18/2013   ??? Mild CAD    ??? Cirrhosis, nonalcoholic ([place])    ??? 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 BPH (benign prostatic hyperplasia) 08/01/2009   ??? Thrombocytopenia 04/30/2009   ??? Edema 04/30/2009       Past Medical History   Diagnosis Date   ??? Hypertension    ??? Kidney stones    ??? Alpha 1 antitrypsin deficiency    ??? Thrombocytopenia ([place]) 04/30/2009   ??? BPH (benign prostatic hyperplasia)    ??? 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 Chronic systolic CHF (congestive heart failure) ([place]) 10/18/2013   ??? CAD (coronary artery disease)      Non-obstructive   ??? Compression fracture of lumbar vertebra ([facility])      multiple, lumbar   ??? Leukopenia    ??? Anemia    ???  Pneumonia    ??? DVT (deep venous thrombosis) ([place])    ??? Cancer ([place])    ??? Hyperlipidemia    ??? Arthritis    ??? Glaucoma    ??? Liver disease    ??? 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 Av fistula repair  1970's   ??? Colonoscopy  4/12     5y   ??? Cardiac catheterization     ??? Eye surgery  04/12/2014     Laser Eye Surgery for glaucoma   ??? Cosmetic surgery  04/2013     eye lids   ??? Bone marrow biopsy     ??? Knee surgery         Restrictions  Restrictions/Precautions  Restrictions/Precautions: Fall Risk (high)  Required Braces or Orthoses?: No  Position Activity Restriction  Other position/activity restrictions: Nurse approved prior to therapy. IVC filter placed 07/31/14  Subjective   General  Chart Reviewed: Yes  Additional Pertinent Hx: Myelodysplasia, HTN, cirrhosis, chronic systolic CHF, CAD, cancer  Response To Previous Treatment: Patient with no complaints from previous session.  Family / Caregiver Present: Yes (wife)  Subjective  Subjective: Pt  agreeable to PT at this time. Pt appears lethargic.  General Comment  Comments: Pt seated in chair upon initiation of session.   Pain Screening  Patient Currently in Pain: Denies  Vital Signs  Patient Currently in Pain: Denies       Orientation  Orientation  Overall Orientation Status: Within Functional Limits  Objective   Bed Mobility  Sit to Supine: Minimal assistance  Scooting: Minimal assistance  Transfers  Sit to Stand: Contact guard assistance;Minimal Assistance (x3, from chair to stand, from rolling chair to stand (min A), from chair to stand, verbal cues for anterior lean)  Stand to sit: Contact guard assistance (x 3, poor eccentric control, from stand to rolling chair, from stand to chair, from stand to EOB (pt require verbal cues for hand placement and sat prematurely before feeling chair posteriorly))  Ambulation  Ambulation?: Yes  Ambulation 1  Surface: level tile  Device: Rolling  Walker  Assistance: Minimal assistance  Quality of Gait: Pt presents with shuffling steps, decreased step length bilaterally, ER rotation bilaterally, forward flexed posture, and decreased gait speed. With fatigue, pt demonstrates R lateral lean.  Pt with preference for forward flexed posture with cervical flexion and gaze toward floor. Verbal and tactile cueing to facilitate upright posture and positioning and to increased step length bilaterally. Pt required break with ambulation and gait went from CGA to min A with fatgue.   Distance: 20'  Ambulation 2  Surface - 2: level tile  Device 2:  (hand held assist)  Assistance 2: Dependent/Total (min A of 2)  Quality of Gait 2: Pt demonstrated similar gait to gait with RW. Pt forward flexion decreased with hand held assist and cadence improved (due to no manipulation or steering of RW). Pt demostrating freezing when trying to turn into chair.  Distance: 23'  Stairs/Curb  Stairs?: No     Balance  Posture: Fair  Sitting - Static: Good  Sitting - Dynamic: Good  Standing - Static: Fair (CGA)  Standing - Dynamic: Fair (min A)  Exercises  Hip Flexion: x10 in chair bilaterally  Knee Long Arc Quad: x10 in chair bilaterally, pt lethargic and almost fell asleep during activity. Pt appears to have difficulty processing commands. Pt given cue to perform exercise with R leg 3 times. Pt kept using L leg in spite of verbal and tactile cues      Assessment   Assessment: Decreased functional mobility ;Decreased strength;Decreased safe judgement ;Decreased endurance;Decreased balance  Assessment: Due to recent hospitalization, the pt is below baseline with ambulation, bed mobility, and transfers. The pt demonstrates poor posture with ambulation and is unsteady, decreasing his independence and safety. The patient's endurance also inhibits his indepdendence with transfers and ambualtion.  He has shuffling gait, right lateral lean, freezing and decreased coordination.  He will benefit from a  neurology evaluation.  The pt would benefit from skilled PT at this time in order to adress these impairements and return to PLOF.  Treatment Diagnosis: decreased functional mobility, decreased strength, decreased safe judgement, decreased endurance, decreased balance   Prognosis: Fair  Requires PT Follow Up: Yes  Total Treatment Time: 34  Timed Code Treatment Minutes: 34 Minutes  Activity Tolerance  Activity Tolerance: Patient Tolerated treatment well;Patient limited by fatigue  PT D/C Equipment  Equipment Needed:  (TBD at next LOC)       Discharge Recommendations:  [place], Patient would benefit from additional therapy      Goals  Short term goals  Time  Frame for Short term goals: to be met prior to discharge   Short term goal 1: Pt to complete all bed mobility with MOD I.   Short term goal 2: Pt to complete all transfers with MOD I.   Short term goal 3: Pt ambulate 150' with use of least restrictive assistive device and SUPV.   Short term goal 4: Pt to negotiate 1 flight of stairs with handrails and CGA.   No STG met 5/31  Plan    Plan  Times per week: 2-5  Times per day: Daily  Current Treatment Recommendations: Strengthening;Balance Training;Functional Mobility Training;Endurance Training;Gait Heritage manager;Safety Education & Training;Patient/Caregiver Education & Training  Patient Education: Educated pt on role of PT, POC, lower extremity ther-ex, importance of posture with ambulation  Safety Devices  Safety Devices in place: Yes  Type of devices: All fall risk precautions in place;Patient at risk for falls;Left in bed;Call light within reach;Nurse notified (wife requested alarm off while she is in the room, wife states she will use call light if patient wants to get out of bed)  Restraints  Initially in place: No     Kevan Ny, PT  License and Documentation Cosign  Therapy License Number: Annalise Rolfes, SPT  I agree with the above note.  PT  directly observed the SPT with the patient.  Molli Barrows, [place] DPT [id number]

## 2014-08-06 NOTE — Plan of Care (Signed)
Problem: Activity:  Goal: Ability to tolerate increased activity will improve  Ability to tolerate increased activity will improve   Outcome: Ongoing  Patient up to bathroom with assist x'1 and walker. Constant guidance and reminders needed.

## 2014-08-07 NOTE — Progress Notes (Signed)
Occupational Therapy Discharge Summary    Name: Andrew [name]  DOB: 1940-04-02    The pt was evaluated by OT on 07/26/2014 and seen for 4 treatment sessions prior to DC to SNF on 08/06/2014 per MD order.   The pt's acute therapy goals were:  Short term goals  Time Frame for Short term goals: until discharge  Short term goal 1: Min assist bathing- Not performed this session. Performed prior to arrival.  Short term goal 2: Min assist dressing- mod assist to doff/don socks.   Short term goal 3: CGA funtional mobility with RW or with 1 hand held assist--- CGA with RW and verbal cues  Short term goal 4: CGA toileting-  not addressed  Long term goals  Time Frame for Long term goals : STGs= LTGs     Patient met 0 goals during stay.  Number of Refusals:0  Number of Holds: 4  During this hospitalization, the patient was educated on:  Patient Education: POC, functional mobility, ADLs, bed mobility, walker management and safety, transfers, safety,- pt demonstrated understanding     DC pt from OT caseload at this time.  Thank you, [place], OTR/L [id number].

## 2014-08-07 NOTE — Telephone Encounter (Signed)
New scheduled follow up appt noted.

## 2014-08-07 NOTE — Telephone Encounter (Signed)
Appointment scheduled for 08/30/2014

## 2014-08-08 NOTE — Telephone Encounter (Signed)
New scheduled follow up appt noted.

## 2014-08-08 NOTE — Telephone Encounter (Signed)
Rodman Key with [facility] called to reschedule a Follow Up appointment for 06.24.2016. Appointment has been rescheduled for 06.03.2016.

## 2014-08-08 NOTE — Telephone Encounter (Signed)
Andrew [name] with [name] called to reschedule a Follow Up appointment. Appointment has been rescheduled for 08/09/2014 for a different time.

## 2014-08-09 ENCOUNTER — Encounter: Attending: Hematology & Oncology | Primary: Internal Medicine

## 2014-08-09 ENCOUNTER — Ambulatory Visit
Admit: 2014-08-09 | Discharge: 2014-08-09 | Payer: MEDICARE | Attending: Hematology & Oncology | Primary: Internal Medicine

## 2014-08-09 DIAGNOSIS — D61818 Other pancytopenia: Secondary | ICD-10-CM

## 2014-08-09 LAB — CBC WITH AUTO DIFFERENTIAL
Granulocytes %: 33.5 % — ABNORMAL LOW (ref 37.0–80.0)
Granulocytes: 0.7 10*3/uL — ABNORMAL LOW (ref 2.0–7.8)
Hematocrit: 26.3 % — ABNORMAL LOW (ref 43.5–53.7)
Hemoglobin: 8.1 g/dL — ABNORMAL LOW (ref 14.1–18.1)
Lymphocyte %: 50.6 % — ABNORMAL HIGH (ref 10.0–50.0)
Lymphocytes: 1.1 10*3/uL (ref 0.6–4.1)
MCH: 31.3 pg — ABNORMAL HIGH (ref 27.0–31.2)
MCHC: 30.8 g/dL — ABNORMAL LOW (ref 31.8–35.4)
MCV: 101.4 fL — ABNORMAL HIGH (ref 80.0–97.0)
Mid Cells %: 15.9 % (ref 0.1–24.0)
Mid Cells: 0.3 10*3/uL (ref 0.0–1.8)
Platelets: 56 10*3/uL — ABNORMAL LOW (ref 142–424)
RBC: 2.59 M/uL — ABNORMAL LOW (ref 4.69–6.13)
RDW: 14.7 % (ref 11.6–14.8)
WBC: 2.2 10*3/uL — ABNORMAL LOW (ref 4.6–10.2)

## 2014-08-09 LAB — MISCELLANEOUS SENDOUT

## 2014-08-09 NOTE — Progress Notes (Signed)
Physical Therapy Discharge Summary    Name: Andrew [name]  DOB: 03-Mar-1941    The pt was evaluated by PT on 07/26/14 and seen for 3 treatment sessions prior to DC to [facility] on 08/06/14 per MD order.   The pt's acute therapy goals were:  Short term goals  Time Frame for Short term goals: to be met prior to discharge   Short term goal 1: Pt to complete all bed mobility with MOD I.   Short term goal 2: Pt to complete all transfers with MOD I.   Short term goal 3: Pt ambulate 150' with use of least restrictive assistive device and SUPV.   Short term goal 4: Pt to negotiate 1 flight of stairs with handrails and CGA.        Patient met 0 goals during stay.  Number of Refusals:0  Number of Holds: 2  During this hospitalization, the patient was educated on:  Patient Education: Educated pt on role of PT, POC, lower extremity ther-ex, importance of posture with ambulation    DC pt from PT caseload at this time.  Thank you, Daishaun Ayre Kendrick Fries, PT

## 2014-08-09 NOTE — Progress Notes (Signed)
[facility] FOLLOW-UP:     Primary Oncologist: Dr. Eulis Canner  Date: 08/09/2014    PCP: Alecia Lemming, DO  Referring Provider: Alecia Lemming, DO   GI:  Rodney Booze, MD    PROBLEM LIST:     Patient Active Problem List   Diagnosis   ??? Erectile dysfunction   ??? Thrombocytopenia   ??? Edema   ??? BPH (benign prostatic hyperplasia)   ??? Alpha-1-antitrypsin deficiency   ??? Cirrhosis, nonalcoholic ([place])   ??? Mild CAD   ??? Chronic systolic CHF (congestive heart failure) ([place])   ??? Non-ischemic cardiomyopathy ([place])   ??? Lumbar compression fracture ([place])   ??? Chronic low back pain   ??? Osteopenia   ??? Coagulopathy ([place])   ??? Abnormal EKG   ??? Visual changes   ??? Fixed pupil of right eye   ??? Anemia   ??? Hemispheric carotid artery syndrome   ??? CAD in native artery   ??? Hyperlipidemia   ??? 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 Bronchiectasis without complication ([place])   ??? Benign essential HTN   ??? Hepatic encephalopathy ([facility])   ??? Ataxia   ??? Pancytopenia ([place])   ??? DVT (deep venous thrombosis) ([place])   ??? 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 Pneumonia   ??? Traumatic hematoma of buttock   ??? Pneumonia of both lungs due to infectious organism   ??? Hematoma       Diagnoses:  1.  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   2.  Cirrhosis.   3.  Alpha-1 antitrypsin deficiency.  4.  Hepatic encephalopathy.  5.  Right leg DVT.  Lovenox stopped because of hematoma.  Status post IVC filter.   6.  Idiopathic cardiomyopathy.     Current therapy:  Will start [place].  Procrit.      INTERVAL HISTORY     The patient returns for follow up.  He was recently hospitalized.  He developed hematoma in his buttock.  He had IVC filter placed.     REVIEW OF SYSTEMS:     CONSTITUTIONAL: Denies fever, sweats, weight loss.  Fatigue.   EYES: No visual changes or diplopia. No scleral icterus.  ENT: No Headaches, hearing loss or vertigo. No mouth sores or sore throat.  CARDIOVASCULAR: No chest pain, dyspnea on exertion,  palpitations or loss of consciousness.   RESPIRATORY: No cough or wheezing, no sputum production. No hemoptysis.    GASTROINTESTINAL: No abdominal pain, appetite loss, blood in stools. No change in bowel habits.  GENITOURINARY: No dysuria, trouble voiding, or hematuria.  MUSCULOSKELETAL: no generalized weakness. No joint complaints.  INTEGUMENTARY: No rash or pruritus.  NEUROLOGICAL: No headache, diplopia. No change in gait, balance, or coordination. No paresthesia.  Could not see well.    ENDOCRINE: No temperature intolerance. No excessive thirst, fluid intake, or urination.   HEMATOLOGIC/LYMPHATIC: No abnormal bruising or ecchymosis, blood clots or swollen lymph nodes.  ALLERGIC/IMMUNOLOGIC: No nasal congestion or hives.     PHYSICAL EXAM:     BP 100/58 mmHg   Pulse 80   Temp(Src) 97.4 ??F (36.3 ??C) (Oral)   Resp 16   Ht $R'5\' 5"'zr$  (1.651 m)   Wt 189 lb (85.73 kg)   BMI 31.45 kg/m2    WEIGHT:   Wt Readings from Last 3 Encounters:   08/09/14 189 lb (85.73 kg)   08/06/14 193 lb 9.6 oz (87.816 kg)   07/24/14 171 lb (  77.565 kg)     GENERAL APPEARANCE: alert and cooperative.  Pale.   HEAD: Normocephalic, without obvious abnormality, atraumatic  NECK: No palpable lymphadenopathy in supraclavicular or cervical chains  LUNGS: Clear to auscultation bilaterally, no audible rales, wheezes or crackles  HEART: Regular rate and rhythm, S1, S2 normal  ABDOMEN: Soft, non-tender; slightly distended. His bowel sounds normal; no masses, no organomegaly  EXTREMITIES: without cyanosis, clubbing, or asymmetry. Mild leg edema.  SKIN: No jaundice, purpura or petechiae.  A few ecchymosis.     LABS:     CBC:  Lab Results   Component Value Date    WBC 2.2* 08/09/2014    HGB 8.1* 08/09/2014    HCT 26.3* 08/09/2014    MCV 101.4* 08/09/2014    PLT 56* 08/09/2014    LABLYMP 1.1 08/09/2014    MID 0.3 08/09/2014    GRAN 0.7* 08/09/2014    LYMPHOPCT 50.6* 08/09/2014    MIDPERCENT 15.9 08/09/2014    GRANULOCYTES 33.5* 08/09/2014    RBC 2.59* 08/09/2014     MCH 31.3* 08/09/2014    MCHC 30.8* 08/09/2014    RDW 14.7 08/09/2014       Lab Results   Component Value Date    NA 138 08/06/2014    K 4.9 08/06/2014    CL 108 08/06/2014    CO2 25 08/06/2014    BUN 22* 08/06/2014    CREATININE 0.6* 08/06/2014    GLUCOSE 90 08/06/2014    CALCIUM 8.6 08/06/2014    PROT 5.1* 08/05/2014    LABALBU 1.7* 08/05/2014    BILITOT 1.8* 08/05/2014    ALKPHOS 70 08/05/2014    AST 29 08/05/2014    ALT 15 08/05/2014    LABGLOM >60 08/06/2014    GFRAA >60 08/06/2014    AGRATIO 0.4* 07/26/2014    GLOB 3.7 07/26/2014       No results found for: PTINR    TUMOR MARKERS:    Lab Results   Component Value Date    PSA 0.55 08/15/2013     Recent EPO was 383.    IMAGING:     Ct Head Wo Contrast    07/07/2014   EXAMINATION: CT OF THE HEAD WITHOUT CONTRAST  07/07/2014 11:11 pm  TECHNIQUE: CT of the head was performed without the administration of intravenous contrast.  COMPARISON: 04/01/2014  HISTORY: ORDERING SYSTEM PROVIDED HISTORY: dizzy TECHNOLOGIST PROVIDED HISTORY: Ordering Physician Provided Reason for Exam: dizzy  FINDINGS: BRAIN/VENTRICLES: There is no acute intracranial hemorrhage, mass effect or midline shift.  No abnormal extra-axial fluid collection.  The gray-white differentiation is maintained without evidence of an acute infarct.  There is no evidence of hydrocephalus.  ORBITS: The visualized portion of the orbits demonstrate no acute abnormality.  SINUSES: There is minimal mucosal thickening in the left maxillary sinus. The remaining visualized paranasal sinuses are clear.  The mastoid air cells are unremarkable.  SOFT TISSUES/SKULL:  No acute abnormality of the visualized skull or soft tissues.     07/07/2014   IMPRESSION: No acute intracranial abnormality.     US Abdomen Complete    07/08/2014   EXAMINATION: COMPLETE ABDOMINAL ULTRASOUND 07/08/2014 6:04 pm  COMPARISON: None  HISTORY: ORDERING SYSTEM PROVIDED HISTORY: CIRRHOSIS TECHNOLOGIST PROVIDED HISTORY: Ordering Physician Provided Reason for  Exam: cirrhosis, r/o ascites Acuity: Acute Type of Exam: Initial  FINDINGS: Study is limited by body habitus.  LIVER: Evaluation of the liver is limited.  There is diffuse increased echogenicity with no focal lesion noted.  Somewhat nodular contour of the liver is compatible with known cirrhosis.  BILIARY SYSTEM: Limited imaging of the gallbladder is grossly unremarkable.  Common bile duct is within normal limits measuring 6 mm.  KIDNEYS: Right kidney is nonvisualized.  Left kidney measures 10.1 x 4.9 x 5.3 cm and is grossly unremarkable.  PANCREAS: Pancreas is nonvisualized.  SPLEEN: Spleen measures 10.1 cm.  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  IVC: The IVC is patent.  AORTA: Aorta is obscured.  OTHER: No evidence of ascites.     07/08/2014   IMPRESSION: Limited study as above.  Cirrhosis and portal hypertension.  No evidence of ascites.     Cta Pulmonary With Contrast    06/27/2014   EXAMINATION: CTA OF THE CHEST 06/27/2014 2:53 pm  TECHNIQUE: CTA of the chest was performed after the administration of intravenous contrast.  Multiplanar reformatted images are provided for review.  MIP images are provided for review.  Patient received 100 mL Isovue 370 intravenous contrast.  COMPARISON: CT chest with contrast June 13, 2014.  HISTORY: ORDERING SYSTEM PROVIDED HISTORY: DVT (deep venous thrombosis), unspecified laterality [facility]) TECHNOLOGIST PROVIDED HISTORY: Ordering Physician Provided Reason for Exam: DVT, SOB Injury/Trauma or Illness: Illness/Other Acuity: Acute Type of Exam: Initial Relevant Medica/Surgical History: CHF, cardiomyopathy  FINDINGS: Pulmonary Arteries: Pulmonary arteries are adequately opacified for evaluation.  No evidence of intraluminal filling defect to suggest pulmonary embolism.  Main pulmonary artery is normal in caliber.  Mediastinum: No evidence of mediastinal lymphadenopathy.  Heart is enlarged, similar in appearance.  The heart and pericardium demonstrate no acute abnormality.  There is no acute  abnormality of the thoracic aorta. Nodularity of thyroid similar in appearance.  Lungs/pleura: The central airways are patent.  There is a moderate right-sided pleural effusion which appears slightly decreased compared to prior study.  There is mild patchy dependent opacities in the right upper lobe, similar in appearance.  There are dependent subpleural opacities within the lingula and left lower lobe.  There is mild opacity within the right middle lobe which is linear in appearance in appears slightly improved compared to prior study.  2 nodular appearing densities are again seen in the right lower lobe on image number 65, unchanged.  No pneumothorax.  Upper Abdomen: Cirrhotic liver with mildly distended gallbladder is similar in appearance to prior study.  Soft Tissues/Bones: Multiple compression fractures again seen, most pronounced at the thoracolumbar junction, similar in appearance.  Old right-sided rib fractures are also seen.  No acute bone or soft tissue abnormality.  Gynecomastia is again seen.     06/27/2014   IMPRESSION: No evidence of pulmonary embolism.  Moderate right pleural effusion, minimally decreased.  Persistent patchy areas of airspace opacity which may be related to atelectasis versus pneumonia, slightly improved.  2 nodular appearing densities are seen within the right lower lobe which may be related to the airspace disease or represent lung nodules.  Follow-up recommended with dedicated chest CT in 3 months.  Remote compression fractures again seen.  Cirrhotic liver.     Vl Extremity Venous Bilateral    07/09/2014   Vascular Lower Extremities DVT Study Procedure  -- PRELIMINARY SONOGRAPHER REPORT --    Demographics    Patient Name      STORM SOVINE    Date of Study     07/09/2014          Gender              Male    Patient Number  [id number]          Date of Birth       02-Feb-1941    Visit Number      [id number]         Age                 23 year(s)    Accession Number  [id number]            Room Number         2694    Corporate ID      [id number]            Sonographer         Erasmo Leventhal,                                                            RVS    Ordering          Deniece Portela,   Interpreting        [facility]  Physician         MD                  Physician   Procedure  Type of Study:    Veins:Lower Extremities DVT Study, VL EXTREMITY VENOUS DUPLEX RIGHT.   Tech Comments Right Acute totally occluding deep vein thrombosis involving the right proximal PTV and one gastroc vein zone 5-6. Acute partially occluding deep vein thrombosis involving the popliteal vein and right one gastroc vein zone 5-6. No other evidence of deep vein or superficial vein thrombosis involving the right lower extremity and the left common femoral vein. Calf veins were not well visualized on the right due to patient positioning and edema.  verbal to [employer].    Vl Extremity Venous Bilateral    06/27/2014   Lower Extremities DVT Study   Demographics    Patient Name       DWAIN HUHN    Date of Study      06/27/2014        Gender              Male    Patient Number     [id number]        Date of Birth       October 21, 1940    Visit Number       [id number]       Age                 89 year(s)    Accession Number   [id number]         Room Number    Corporate ID       [id number]          Sonographer         Erasmo Leventhal,    RVS    Ordering Physician Lacretia Leigh, MD  Interpreting        [facility]   Physician           Darlyn Chamber MD,    [facility]   Procedure  Type of Study:    Veins:Lower Extremities DVT Study, VASC EXTREMITY VENOUS DUPLEX BILATERAL.    Vascular Sonographer Report   Additional Indications:Swelliing  Impressions Right Impression Acute partially occluding deep vein thrombosis involving  the right set of gastroc veins zone 5-6. No other evidence of deep vein or superficial vein thrombosis involving the right lower extremity. Reflux noted in the right FV. Left Impression No evidence of deep vein or  superficial vein thrombosis involving the left lower extremity.  Verbal to Dr. Jeb Levering. Patient released.  Conclusions    Summary    -Acute partially occluding deep vein thrombosis involving the right set of  gastroc veins zone 5-6.  -Reflux noted in the right FV.    Signature  Electronically signed by Darlyn Chamber MD, [facility] (Interpreting  physician) on 06/27/2014 at 01:38 PM    Patient Status:Routine. [place] [facility]. Technical Quality:Adequate visualization.  Velocities are measured in cm/s ; Diameters are measured in mm  Right Lower Extremities DVT Study Measurements Right 2D Measurements       STAGING:     No matching staging information was found for the patient.    ASSESSMENT AND PLAN:     1.  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   2.  Cirrhosis.   3.  Alpha-1 antitrypsin deficiency.  4.  Hepatic encephalopathy.  5.  Right leg DVT.  Lovenox stopped because of hematoma.  Status post IVC filter.  6.  Idiopathic cardiomyopathy.     Long discussion with the patient and his wife about his conditions and poor prognosis.  They are still leaning toward active treatment.  They will discuss with the rest of the family.  He was tentatively scheduled for Vidaza on 6/13.  We will start at 50 mg/m2.  He will see our NP on 6/24.    Eulis Canner, M.D.; M.S.  Medical Oncology/Hematology  Phone: [phone]  Fax: [phone]    [facility]  [address] [place] [address] [place] [address]    [place] [address]    [place] [address]

## 2014-08-13 NOTE — Discharge Summary (Signed)
[facility] -- Physician Discharge Summary     Andrew [name]  Nov 16, 1940  MRN: [id number]    Admit Date: 07/25/2014  Discharge Date: 08/06/2014  4:30 PM    Attending MD: Deniece Portela, MD  Discharging MD: Deniece Portela, MD  PCP: Alecia Lemming, DO [place] / [place] [address] [phone]    Admission Diagnosis: PNEUMONIA [[id number]]    Full Hospital Problem List:  [facility] Problems    Diagnosis Date Noted   ??? Hematoma [T14.8] 07/31/2014   ??? Pneumonia of both lungs due to infectious organism [J18.9]    ??? Traumatic hematoma of buttock [S30.0XXA]    ??? Pneumonia [J18.9] 07/25/2014   ??? 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 DVT (deep venous thrombosis) ([place]) [I82.409] 07/10/2014   ??? Hepatic encephalopathy ([place]) [K72.90] 07/07/2014   ??? Benign essential HTN [I10] 07/03/2014           Hospital Course:  74 y.o. male presents to the emergency department accompanied by family for evaluation of generalized weakness. The patient is awake and alert, but very slow to respond to questions, and most of the history is provided by family, mainly his wife. They report gradual onset of generalized weakness over the past week. Patient was actually discharged from the hospital approximately one week ago after a one-week stay in the hospital for which he was evaluated for hepatic encephalopathy and was newly diagnosed with myelodysplastic syndrome. They report that he has been compliant with all medications this week, to include his lactulose 4 times a day. He has been eating reasonably well. He has been falling frequently due to his generalized weakness. They brought him in this morning because he was weak to the point where he was unable to walk or get out of bed. He has not passed out. No injuries from his falls. No fevers, chills, or malaise. No nausea or vomiting. No urinary symptoms. No other voiced complaints. After review of chest x-ray results with the family,  his wife does mention that she noticed him developed a nonproductive cough yesterday. He did see his primary care physician yesterday.    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  He does require a few transfusions while here  Pt was supposed to start chemotx for in the week after admission, but at this time, Heme feels he is too weak to tolerate    Pt is discharged to [facility] for continued rehab        Consults made during Hospitalization:  IP CONSULT TO INTERNAL MEDICINE  IP CONSULT TO PHARMACY  IP CONSULT TO GI  IP CONSULT TO ONCOLOGY  IP CONSULT TO SOCIAL WORK  IP CONSULT TO [place]    Treatment team at time of Discharge: Treatment Team: Attending Provider: Deniece Portela, MD; Consulting Physician: Deniece Portela, MD; Consulting Physician: Eulis Canner, MD; Consulting Physician: Sable Feil, RN, [facility]; Consulting Physician: Nance Pew,  MD; Registered Nurse: Macky Lower Riede, RN; Registered Nurse: Laban Emperor, RN    Imaging Results:  Xr Chest Standard Two Vw    08/04/2014   EXAMINATION: TWO VIEWS OF THE CHEST  08/04/2014 12:58 pm  COMPARISON: 07/27/2014 radiograph  HISTORY: ORDERING SYSTEM PROVIDED HISTORY: cough TECHNOLOGIST PROVIDED HISTORY: Ordering Physician Provided Reason for Exam: cough Acuity: Acute Type of Exam: Ongoing  FINDINGS: The heart is enlarged.  Mediastinum is normal.  Mild pulmonary vascular congestion is stable centrally.  Since prior imaging, a small right-sided pleural effusion has developed, and there is moderate ground-glass attenuation in the right  lower lung zone.  Persistent mild pleural thickening versus fluid along the lateral aspect of the lower left chest.  No skeletal abnormalities.  Remote compression fracture in the lower thoracic spine, stable.     08/04/2014   IMPRESSION: In this patient with cardiomegaly, new right pleural effusion and ground-glass attenuation in the right lung base may represent developing pulmonary edema.  Atelectasis or developing pneumonia cannot be excluded.     Xr Chest Standard Two Vw    07/27/2014   EXAMINATION: TWO VIEWS OF THE CHEST  07/27/2014 5:52 pm  COMPARISON: 07/25/2014  HISTORY: ORDERING SYSTEM PROVIDED HISTORY: follow up pneumonia TECHNOLOGIST PROVIDED HISTORY: Ordering Physician Provided Reason for Exam: follow up pneumonia Acuity: Acute Type of Exam: Initial  FINDINGS: Compression fracture of the lower thoracic spine is again seen.  Minimal right base atelectasis is noted.  There is scarring on the left.  Remote rib fractures are seen.  Cardiac silhouette within normal limits.  There is a hiatal hernia     07/27/2014   IMPRESSION: No acute disease     Xr Chest Standard Two Vw    07/25/2014   EXAMINATION: TWO VIEWS OF THE CHEST  07/25/2014 8:17 am  COMPARISON: Chest x-ray Jul 07, 2014  HISTORY: ORDERING SYSTEM PROVIDED HISTORY: generalized weakness, hypotension TECHNOLOGIST PROVIDED HISTORY: Ordering Physician Provided Reason for Exam: weakness Acuity: Unknown Type of Exam: Unknown  FINDINGS: There is new focal density of the right upper lobe above the hilum.  There also is new faint airspace disease of the lateral right lung base.  There is new focal opacity of the retrocardiac left lower lobe.  There is blunting of the left costophrenic angle consistent with pleural fluid.     07/25/2014   IMPRESSION: New bilateral multifocal airspace disease, most likely atelectasis or pneumonia although follow-up to ensure resolution is recommended.  New small to moderate size left pleural effusion.     Ct Chest W  Contrast    07/26/2014   EXAMINATION: CT OF THE CHEST WITH CONTRAST 07/26/2014 2:14 pm  TECHNIQUE: CT of the chest was performed with the administration of intravenous contrast. Multiplanar reformatted images are provided for review. Dose modulation, iterative reconstruction, and/or weight based adjustment of the mA/kV was utilized to reduce the radiation dose to as low as reasonably achievable.  COMPARISON: 06/27/2014  HISTORY: ORDERING SYSTEM PROVIDED HISTORY: SHORTNESS OF BREATH  FINDINGS: Mediastinum: Stable mediastinal lymph nodes, no convincing adenopathy. Thoracic aorta normal in caliber.  No pericardial effusion.  .  Lungs/pleura: Interval decrease in right pleural effusion.  Stable trace left pleural effusion.  There are multifocal airspace opacities that are predominantly bandlike in morphology, mostly in the lower lobes, right middle lobe, and lingula.  There are a cluster of tiny nodules in the lateral right upper lobe.  There is a stable 1.3 cm nodule in the superior segment of the right  lower lobe.  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  There is a tiny nonobstructing right renal stone.  Large venous collaterals are noted adjacent to the spleen  Soft Tissues/Bones: There is bilateral gynecomastia.  There are multiple thyroid nodules     07/26/2014   IMPRESSION: 1. Multifocal airspace opacities in the lower lobes, right middle lobe, and lingula have an appearance compatible with atelectasis, with or without pneumonia.  Cluster of nodular densities in the lateral right upper lobe most likely represents atypical infection.  1.3 cm nodule in the superior segment of the right lower lobe warrants six-month follow-up 2. Interval decrease in right pleural effusion 3. 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 Multiplanar reformatted images are provided for review. Dose modulation, iterative reconstruction, and/or weight based adjustment of the mA/kV was utilized to reduce the radiation dose to as low as reasonably achievable.  COMPARISON: 09/07/2010  HISTORY: ORDERING SYSTEM PROVIDED HISTORY: ABD PAIN, FEVER, NO RECENT SURGERY TECHNOLOGIST PROVIDED HISTORY: Additional Contrast?->Oral Ordering Physician Provided Reason for Exam: ABD PAIN, FEVER, NO RECENT SURGERY Acuity: Unknown Type of Exam: Unknown  Unexplained anemia.  Evaluate for retroperitoneal hemorrhage.  FINDINGS: Lower Chest: There is mild bibasilar pleural and parenchymal disease, new from the prior study.  Organs: The liver is shrunken with a nodular contour consistent with cirrhosis.  No focal liver lesion.  The main portal vein is small in size and grossly patent.  There is a recannulized umbilical vein.  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  GI/Bowel: Bowel loops are unremarkable.  The gallbladder is moderately distended.  There are no calcified gallstones.  Pelvis: Urinary bladder, prostate gland and seminal vesicles are unremarkable.  Peritoneum/Retroperitoneum: No adenopathy, free air or free fluid.  Bones/Soft Tissues: There is bilateral flank edema.  There is a large left gluteus muscle mass/hematoma extending into the left upper thigh.     07/30/2014   IMPRESSION: Large left gluteus muscle mass/hematoma likely accounting for the unexplained anemia.  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         Discharge Exam:  BP 96/60 mmHg   Pulse 84   Temp(Src) 99 ??F (37.2 ??C) (Temporal)   Resp 16   Ht 5\' 5"  (1.651 m)   Wt 193 lb 9.6 oz (87.816 kg)   BMI 32.22 kg/m2   SpO2 92%    General Appearance:    Alert, cooperative, no distress, appears stated age   Head:    Normocephalic, without  obvious abnormality, atraumatic   Eyes:    PERRL, conjunctiva/corneas clear, EOM's intact, fundi     benign, both eyes        Ears:    Normal TM's and external ear canals, both ears   Nose:   Nares normal, septum midline, mucosa normal, no drainage    or sinus tenderness   Throat:   Lips, mucosa, and tongue normal; teeth and gums normal   Neck:   Supple, symmetrical, trachea midline, no adenopathy;        thyroid:  No enlargement/tenderness/nodules; no carotid    bruit or JVD   Back:     Symmetric, no curvature, ROM normal,  no CVA tenderness   Lungs:     Clear to auscultation bilaterally, respirations unlabored   Chest wall:    No tenderness or deformity   Heart:    Regular rate and rhythm, S1 and S2 normal, no murmur, rub   or gallop   Abdomen:     Soft, non-tender, bowel sounds active all four quadrants,     no masses, no organomegaly   Genitalia:    Normal male without lesion, discharge or tenderness   Rectal:    Normal tone, normal prostate, no masses or tenderness;    guaiac negative stool   Extremities:   Extremities normal, atraumatic, no cyanosis or edema   Pulses:   2+ and symmetric all extremities   Skin:   Skin color, texture, turgor normal, no rashes or lesions   Lymph nodes:   Cervical, supraclavicular, and axillary nodes normal   Neurologic:   CNII-XII intact. Normal strength, sensation and reflexes       throughout       Disposition: SNF    Discharge Medications:   [place], [place] Medication Instructions UVO:[id number]    Printed on:08/13/14 1526   Medication Information                      carvedilol (COREG) 6.25 MG tablet  3.125 mg in AM and 6.25 mg in PM             Cholecalciferol (VITAMIN D3) 1000 UNITS CAPS  Take by mouth daily              citalopram (CELEXA) 10 MG tablet  Take 1 tablet by mouth daily             eplerenone (INSPRA) 50 MG tablet  Take 1 tablet by mouth daily             furosemide (LASIX) 40 MG tablet  80 mg in AM, 40 mg PM             lactulose 20 GM/30ML SOLN  Take 30  mLs by mouth 2 times daily             magnesium oxide (MAG-OX) 400 (241.3 MG) MG TABS tablet  TAKE ONE TABLET BY MOUTH TWICE DAILY             MILK THISTLE PO  Take  by mouth.             Multiple Vitamins-Minerals (CENTRUM SILVER PO)  Take  by mouth daily.             rifaximin (XIFAXAN) 550 MG tablet  Take 550 mg by mouth 2 times daily.               traMADol (ULTRAM) 50 MG tablet  Take 1 tablet by mouth every 12 hours as needed for Pain                 Allergies:  Allergies   Allergen Reactions   ??? Spironolactone Other (See Comments)     gynecomastia       Follow up Instructions:  Follow-up with PCP: Powers, DO in 2-4 wk .      Total time spent on day of discharge including face-to-face visit, examination, documentation, counseling, preparation of discharge plans and followup, and discharge medicine reconciliation and presciptions is 35 minutes    Signed:  Deniece Portela, MD  08/13/2014

## 2014-08-16 NOTE — Telephone Encounter (Signed)
[name] ([facility]) called regarding patient has an infection. on Zithromax. asking if he needs to r/s his infusion appt for Monday 6/13.. Transferred to [facility] Triage

## 2014-08-16 NOTE — Telephone Encounter (Signed)
Message left that patient may reschedule the chemo to start 08/26/14 due to being treated for pneumonia.   Call Center- please reschedule 08/19/14 appt to 08/26/14 when he calls back.

## 2014-08-16 NOTE — Telephone Encounter (Signed)
Spoke with Andrew [name] the son of Andrew [name] regarding that he was just started on Zpak today due to chest xray done today showed pneumonia. He is due to start on treatment Monday, should be this be rescheduled or keep. Please discuss with doctor and call Andrew [name] back at [phone].

## 2014-08-19 ENCOUNTER — Encounter: Primary: Internal Medicine

## 2014-08-19 NOTE — Telephone Encounter (Signed)
Called patient's Andrew [name].  Spoke to patient's nurse and stated since the patient is currently on an antibiotic and recovering from pneumonia our office will reschedule patient for next week. Rescheduled patient to start chemotherapy Monday 08/26/14 at 11:00 at the [facility] office.  Nurse verbalized understanding.

## 2014-08-20 ENCOUNTER — Encounter: Primary: Internal Medicine

## 2014-08-20 DIAGNOSIS — I493 Ventricular premature depolarization: Secondary | ICD-10-CM | POA: Insufficient documentation

## 2014-08-20 NOTE — Telephone Encounter (Signed)
Received fax from patient's nursing home.  [place] in regards to patient's lab work.  Instructed the patient's nurse that patient will need 2 units PRBC.    [place]. Spoke to [place] who stated patient will need to be at the infusion center tomorrow at 8:30 for a type and screen and 2 units PRBC.    Order faxed for 2 units PRBC.    Returned phone call to patient's nursing home.  Instructed the receptionist that patient will need to be at [facility] tomorrow at 8:30 for 2 units PRBC.

## 2014-08-21 ENCOUNTER — Encounter: Primary: Internal Medicine

## 2014-08-21 ENCOUNTER — Inpatient Hospital Stay: Admit: 2014-08-21 | Payer: MEDICARE | Attending: Nurse Practitioner | Primary: Internal Medicine

## 2014-08-21 LAB — PREPARE RBC (CROSSMATCH)

## 2014-08-21 LAB — TYPE AND SCREEN
ABO/Rh: A NEG
Antibody Screen: NEGATIVE

## 2014-08-21 MED ORDER — FUROSEMIDE 10 MG/ML IJ SOLN
10 MG/ML | Freq: Once | INTRAMUSCULAR | Status: DC
Start: 2014-08-21 — End: 2014-08-23

## 2014-08-21 MED ORDER — NORMAL SALINE FLUSH 0.9 % IV SOLN
0.9 % | INTRAVENOUS | Status: DC | PRN
Start: 2014-08-21 — End: 2014-08-23

## 2014-08-21 MED ORDER — DIPHENHYDRAMINE HCL 25 MG PO TABS
25 MG | Freq: Once | ORAL | Status: AC
Start: 2014-08-21 — End: 2014-08-21
  Administered 2014-08-21: 13:00:00 25 mg via ORAL

## 2014-08-21 MED ORDER — SODIUM CHLORIDE 0.9 % IV BOLUS
0.9 % | Freq: Once | INTRAVENOUS | Status: DC
Start: 2014-08-21 — End: 2014-08-23

## 2014-08-21 MED ORDER — ACETAMINOPHEN 325 MG PO TABS
325 MG | Freq: Once | ORAL | Status: AC
Start: 2014-08-21 — End: 2014-08-21
  Administered 2014-08-21: 13:00:00 650 mg via ORAL

## 2014-08-21 MED FILL — NORMAL SALINE FLUSH 0.9 % IV SOLN: 0.9 % | INTRAVENOUS | Qty: 20

## 2014-08-21 MED FILL — SODIUM CHLORIDE 0.9 % IV SOLN: 0.9 % | INTRAVENOUS | Qty: 250

## 2014-08-21 MED FILL — DIPHENHYDRAMINE HCL 25 MG PO TABS: 25 MG | ORAL | Qty: 1

## 2014-08-21 MED FILL — ACETAMINOPHEN 325 MG PO TABS: 325 MG | ORAL | Qty: 2

## 2014-08-21 MED FILL — FUROSEMIDE 10 MG/ML IJ SOLN: 10 MG/ML | INTRAMUSCULAR | Qty: 2

## 2014-08-21 NOTE — Progress Notes (Signed)
Patient left department via wheelchair per transport company. Tolerated both units of blood without any problem. Helped to bathroom. Depends wet. Old depends removed and new one applied. Patient is very unsteady on feet and takes at least 2 staff members to help him walk.

## 2014-08-21 NOTE — Progress Notes (Signed)
Patient came to [place] for 2 units of blood. Wife at bedside. Patient slept through most of the transfusion. Blood infused without any problem. Patient's IV Lasix held since patient's blood pressure has been in mid to high 80's and low 90's throughout visit. Transportation called to take patient back to [facility]. Will call staff at [facility] to make them aware of low blood pressure. Spoke with Rodman Key at [employer].

## 2014-08-22 ENCOUNTER — Encounter: Primary: Internal Medicine

## 2014-08-23 ENCOUNTER — Encounter: Primary: Internal Medicine

## 2014-08-26 ENCOUNTER — Encounter: Attending: Internal Medicine | Primary: Internal Medicine

## 2014-08-26 ENCOUNTER — Ambulatory Visit
Admit: 2014-08-26 | Discharge: 2014-08-26 | Payer: MEDICARE | Attending: Cardiovascular Disease | Primary: Internal Medicine

## 2014-08-26 ENCOUNTER — Encounter: Admit: 2014-08-26 | Discharge: 2014-08-26 | Payer: MEDICARE | Primary: Internal Medicine

## 2014-08-26 DIAGNOSIS — I5022 Chronic systolic (congestive) heart failure: Secondary | ICD-10-CM

## 2014-08-26 DIAGNOSIS — D469 Myelodysplastic syndrome, unspecified: Secondary | ICD-10-CM

## 2014-08-26 LAB — CBC WITH AUTO DIFFERENTIAL
Granulocytes %: 32.1 % — ABNORMAL LOW (ref 37.0–80.0)
Granulocytes: 0.9 10*3/uL — ABNORMAL LOW (ref 2.0–7.8)
Hematocrit: 31.2 % — ABNORMAL LOW (ref 43.5–53.7)
Hemoglobin: 10.2 g/dL — ABNORMAL LOW (ref 14.1–18.1)
Lymphocyte %: 57.2 % — ABNORMAL HIGH (ref 10.0–50.0)
Lymphocytes: 1.5 10*3/uL (ref 0.6–4.1)
MCH: 31.8 pg — ABNORMAL HIGH (ref 27.0–31.2)
MCHC: 32.7 g/dL (ref 31.8–35.4)
MCV: 97.2 fL — ABNORMAL HIGH (ref 80.0–97.0)
Mid Cells %: 10.7 % (ref 0.1–24.0)
Mid Cells: 0.3 10*3/uL (ref 0.0–1.8)
Platelets: 65 10*3/uL — ABNORMAL LOW (ref 142–424)
RBC: 3.21 M/uL — ABNORMAL LOW (ref 4.69–6.13)
RDW: 15.3 % — ABNORMAL HIGH (ref 11.6–14.8)
WBC: 2.7 10*3/uL — ABNORMAL LOW (ref 4.6–10.2)

## 2014-08-26 MED ORDER — PROMETHAZINE HCL 25 MG PO TABS
25 MG | ORAL_TABLET | Freq: Four times a day (QID) | ORAL | 0 refills | Status: AC | PRN
Start: 2014-08-26 — End: ?

## 2014-08-26 MED ORDER — DEXAMETHASONE 4 MG PO TABS
4 MG | Freq: Once | ORAL | Status: AC
Start: 2014-08-26 — End: 2014-08-26
  Administered 2014-08-26: 15:00:00 8 mg via ORAL

## 2014-08-26 MED ORDER — ONDANSETRON HCL 8 MG PO TABS
8 MG | Freq: Once | ORAL | Status: AC
Start: 2014-08-26 — End: 2014-08-26
  Administered 2014-08-26: 15:00:00 8 mg via ORAL

## 2014-08-26 MED ORDER — AZACITIDINE 100 MG IJ (MIXTURES ONLY) (OHC)
100 MG | Freq: Once | INTRAMUSCULAR | Status: AC
Start: 2014-08-26 — End: 2014-08-26
  Administered 2014-08-26: 16:00:00 95 mg/m2 via SUBCUTANEOUS

## 2014-08-26 MED FILL — AZACITIDINE 100 MG IJ SUSR: 100 MG | INTRAMUSCULAR | Qty: 95

## 2014-08-26 MED FILL — ONDANSETRON HCL 8 MG PO TABS: 8 MG | ORAL | Qty: 1

## 2014-08-26 MED FILL — DEXAMETHASONE 4 MG PO TABS: 4 MG | ORAL | Qty: 2

## 2014-08-26 NOTE — Progress Notes (Signed)
Patient Teaching  Patient teaching care plan completed, Chemotherapy drug information sheets given and reviewed, Oral assessment teaching form completed, Symptom management instruction sheets given and New patient teaching folder contents reviewed  Discussed with: Patient and Family member    Informed Consent  Patient caregiver verbalizes understanding of teaching performed and Informed consent recorded in Epic: [email or web address]

## 2014-08-26 NOTE — Patient Instructions (Signed)
Clinic Discharge Note     Clinic Discharge Note: Treatment was administered as prescribed without adverse event, with physician oversight.;Universal precautions observed with treatment;Patient instructed to call office with any questions or problems.  Discharge Teaching Instructions.;Verify on MAR all medications due today have been administered and documented on.;Return appointment scheduled;Discharged from clinic.    Patients Receiving  Treatment:  Report to your physician's office immediately or call  [phone]:              1.  Fever of 100.5 or greater.                2.  Vomiting without relief after using anti-nausea/vomiting medicine.                3.  Severe abdominal cramping/diarrhea, 3-5 watery bowel movements in a day.                4.  Pain at the IV site where you received your chemotherapy.                5.  Unusual or excessive bleeding from your mouth, nose, rectum, or vagina;  blood in your urine.                6.  A sudden onset of shortness of breath or chest pain.    Report to your physician's office within 24 hours:              1.  Pain not relieved by your usual pain medication.                2.  Signs of infection; redness, warmth, or swelling.                3.  Unusual bruising of any body area.                4.  Change in breathing patterns such as difficulty breathing or shortness of breath when you are resting.                5.  Change in urination; cloudiness, odor, frequency, or pain on urination.    Report to your physician's office within 48 hours:              1.  Skin changes: rash, hives, itching, redness and peeling of the skin.                2.  Numbness, tingling in the extremities or a persistent ringing in the ears.    Practice good personal hygiene.  If possible, avoid persons with colds or flu-like symptoms.  Drink plenty of fluids while awake and avoid exposure to the sun.  It is not unusual to experience other side effects, such as loss of appetite, altered  taste sensation or depression.

## 2014-08-26 NOTE — Progress Notes (Signed)
Patient to treatment suite for Vidaza.  Consent signed and education provided to patient and spouse.  PO pre meds given.  1155- Vidaza injections given in 2 injections in right abdomen.  Patient tolerated well.     Discharge Checklist  Clinic discharge accompanied by Spouse    Mode of transportation: Via wheelchair    Clinic Discharge Note:  Treatment was admistered as prescribed without adverse event, with physician oversight.  Universal Precautions observed with treatment.  Patient instructed to call office with any questions or problems. Discharge Teaching instruction  Patient Physical Status adequate  Verify on flowchart all medications due today have been administered and documented.  Return appointment scheduled  Discharged from clinic

## 2014-08-26 NOTE — Progress Notes (Signed)
[facility]   Cardiac Follow up     Primary Care Doctor:  Debbe Bales, DO    Chief Complaint   Patient presents with   ??? 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  The spironolactone was stopped in March due to gynecomastia.  He then developed cough with fever, PNA per CXR and treated with ATBX.  He then called on 3/29 with increased edema so his Lasix was increased to 40 mg bid.  A CT scan on 06/13/14 continued to suggest bilateral PNA as well as worsening right pleural effusion.  Today he is having increased swelling but decrease in weight.  Blood work from 06/07/39/6 reviewed and stable.  His appetite has not been as good this past week but his weight has been fluctuating greatly over the past month.  His breathing has been improving since treated for the PNA.  He admits to some shortness of breath with rehab, more with the free weights.    Andrew [name] describes symptoms including dyspnea, fatigue, early saiety, edema, lightheadedness but denies chest pain, palpitations, orthopnea, PND, syncope.   Has had 2 hospitalizations     myolo dysplasia starting chemortherapy   Another admission for liver with bleeding in  abdomen had IVC filter placed and is foo anticoagulation  and is of still at [facility].         Recent Hospitalization or Testing:   Echo 04/02/14:  -Limited bedside 2D echocardiogram was performed to evaluate EF.   -Left ventricular size is mildly increased .   -Left ventricular function is low normal with ejection fraction estimated at   50%.   -There is a question of mild inferior hypokinesis.   -There is mild mitral and trivial pulmonic and tricuspid regurgitation with   RVSP estimated at 27 mmHg.   -Right heart size is borderline dilated .   -Dilated left atrium with a volume of  22ml.  Echo 01/16/14:  -Limited 2D echocardiogram to evaluate ejection fraction.   -The left ventricular systolic function is mildly reduced with an ejection   fraction of 45%  Cardiac Cath 10/03/13:  Anatomy:   LM-normal   LAD-50% mid with FFR of 0.79  Cx-normal, dominant  OM1- normal  RCA-normal  LVEF- 30%  Hemodynamics:  RA- mean 12  RV- 37  PAWP - mean 19    PASP- 40 mmHg  C.O. 5.6    Echo 09/05/2013--  Dilated left ventricle. Normal wall thickness. Decreased left ventricular systolic function with an estimated EF 30-35%.Inferior wall hypokinesis E/e=25. Mild to moderate mitral regurgitation. The left atrium is dilated. Trace of aortic insufficiency. Mild tricuspid regurgitation with RVSP estimated at 36 mmHg.The aorta is within normal limits.    NYHA:  III    Past Medical History:   has a past medical history of Alpha 1 antitrypsin deficiency; Anemia; Arthritis; BPH (benign prostatic hyperplasia); CAD (coronary artery disease); Cancer (HCC); Chronic systolic CHF (congestive heart failure) (HCC); Cirrhosis, nonalcoholic (HCC); Compression fracture of lumbar vertebra (HCC); DVT (deep venous thrombosis) (HCC); Glaucoma; Hyperlipidemia; Hypertension; Kidney stones; Leukopenia; Liver disease; Myelodysplasia (myelodysplastic syndrome) (HCC); Pneumonia; and Thrombocytopenia (HCC).  Surgical History:   has a past surgical history that includes AV fistula repair (1970's);  Colonoscopy (4/12); Cardiac catheterization; eye surgery (04/12/2014); Cosmetic surgery (04/2013); bone marrow biopsy; and knee surgery.   Social History:   reports that he has never smoked. He has never used smokeless tobacco. He reports that he does not drink alcohol or use illicit drugs.   Family History:   Family History   Problem Relation Age of Onset   ??? Diabetes Mother    ??? Hypertension Mother    ??? Diabetes Brother    ??? Diabetes Sister        Home Medications:  Prior to Admission medications    Medication Sig Start Date End Date Taking? Authorizing  Provider   vitamin B-12 (CYANOCOBALAMIN) 100 MCG tablet Take 50 mcg by mouth daily   Yes Historical Provider, MD   saccharomyces boulardii (FLORASTOR) 250 MG capsule Take 250 mg by mouth 2 times daily   Yes Historical Provider, MD   citalopram (CELEXA) 10 MG tablet Take 1 tablet by mouth daily 07/03/14  Yes Scott A Kotzin, DO   furosemide (LASIX) 40 MG tablet 80 mg in AM, 40 mg PM  Patient taking differently: Take by mouth daily 80 mg in am and 40 mg in pm 06/25/14  Yes Talbert Cage, NP   carvedilol (COREG) 6.25 MG tablet 3.125 mg in AM and 6.25 mg in PM  Patient taking differently: Take by mouth daily 3.125 morning, 6.25 bed time 06/24/14  Yes Talbert Cage, NP   eplerenone (INSPRA) 50 MG tablet Take 1 tablet by mouth daily 06/24/14  Yes Talbert Cage, NP   magnesium oxide (MAG-OX) 400 (241.3 MG) MG TABS tablet TAKE ONE TABLET BY MOUTH TWICE DAILY 04/26/14  Yes Scott A Kotzin, DO   traMADol (ULTRAM) 50 MG tablet Take 1 tablet by mouth every 12 hours as needed for Pain 03/07/14  Yes Scott A Kotzin, DO   lactulose 20 GM/30ML SOLN Take 30 mLs by mouth 2 times daily  Patient taking differently: Take 20 g by mouth 2 times daily  08/13/13  Yes Scott A Kotzin, DO   Cholecalciferol (VITAMIN D3) 1000 UNITS CAPS Take 1 capsule by mouth daily    Yes Historical Provider, MD   MILK THISTLE PO Take 175 mg by mouth daily    Yes Historical Provider, MD   rifaximin (XIFAXAN) 550 MG tablet Take 550 mg by mouth 2 times daily.     Yes Historical Provider, MD   Multiple Vitamins-Minerals (CENTRUM SILVER PO) Take 1 tablet by mouth daily  08/23/08  Yes Historical Provider, MD        Allergies:  Spironolactone     Review of Systems:   ?? Constitutional: there has been no unanticipated weight loss. There's been no change in energy level, sleep pattern, or activity level.     ?? Eyes: No visual changes or diplopia. No scleral icterus.  ?? ENT: No Headaches, hearing loss or vertigo. No mouth sores or sore throat.  ?? Cardiovascular: Reviewed  in HPI  ?? Respiratory: No cough or wheezing, no sputum production. No hematemesis.    ?? Gastrointestinal: No abdominal pain, appetite loss, blood in stools. No change in bowel or bladder habits.  ?? Genitourinary: No dysuria, trouble voiding, or hematuria.  ?? Musculoskeletal:  No gait disturbance, weakness or joint complaints.  ?? Integumentary: No rash or pruritis.  ?? Neurological: No headache, diplopia, change in muscle strength, numbness or tingling. No change in gait, balance, coordination, mood, affect, memory, mentation, behavior.  ?? Psychiatric: No anxiety, no depression.  ??  Endocrine: No malaise, fatigue or temperature intolerance. No excessive thirst, fluid intake, or urination. No tremor.  ?? Hematologic/Lymphatic: No abnormal bruising or bleeding, blood clots or swollen lymph nodes.  ?? Allergic/Immunologic: No nasal congestion or hives.    Physical Examination:    Vitals:    08/26/14 0930   BP: 104/64   Site: Right Arm   Position: Sitting   Cuff Size: Medium Adult   Pulse: 68   SpO2: 92%   Weight: 174 lb (78.9 kg)        Constitutional and General Appearance: Warm and dry, no apparent distress, normal coloration  HEENT:  Normocephalic, atraumatic  Respiratory:  ?? Normal excursion and expansion without use of accessory muscles  ?? Resp Auscultation: Bibasilar rales 1/2 up, R > L   Cardiovascular:  ?? The apical impulses not displaced  ?? Heart tones are crisp and normal  ?? JVP 10-11 cm H2O  ?? Regular rate and rhythm, normal S1S2, no m/g/r/c  ?? Peripheral pulses are symmetrical and full  ?? There is no clubbing, cyanosis of the extremities.  ?? 2+ edema  ?? Pedal Pulses: 2+ and equal   Abdomen:  ?? No masses or tenderness  ?? Liver/Spleen: No Abnormalities Noted  Neurological/Psychiatric:  ?? Alert and oriented in all spheres  ?? Moves all extremities well  ?? Exhibits normal gait balance and coordination  ?? No abnormalities of mood, affect, memory, mentation, or behavior are noted    Lab Data:  CBC:   Lab Results    Component Value Date    WBC 2.2 08/09/2014    WBC 2.4 08/06/2014    WBC 2.0 08/05/2014    WBC 2.1 08/04/2014    WBC 2.8 07/19/2014    RBC 2.59 08/09/2014    RBC 2.39 08/06/2014    RBC 2.29 08/05/2014    RBC 2.27 08/04/2014    RBC 2.44 07/19/2014    HGB 8.1 08/09/2014    HGB 8.0 08/06/2014    HGB 7.6 08/05/2014    HCT 26.3 08/09/2014    HCT 24.2 08/06/2014    HCT 22.7 08/05/2014    MCV 101.4 08/09/2014    MCV 101.3 08/06/2014    MCV 99.2 08/05/2014    RDW 14.7 08/09/2014    RDW 17.0 08/06/2014    RDW 16.5 08/05/2014    PLT 56 08/09/2014    PLT 56 08/06/2014    PLT 49 08/05/2014     BMP:   Lab Results   Component Value Date    NA 138 08/06/2014    NA 138 08/05/2014    NA 135 08/04/2014    K 4.9 08/06/2014    K 5.2 08/05/2014    K 5.1 08/04/2014    CL 108 08/06/2014    CL 108 08/05/2014    CL 108 08/04/2014    CO2 25 08/06/2014    CO2 24 08/05/2014    CO2 22 08/04/2014    PHOS 2.9 04/06/2010    PHOS 3.3 10/30/2009    PHOS 2.9 10/09/2009    BUN 22 08/06/2014    BUN 21 08/05/2014    BUN 19 08/04/2014    CREATININE 0.6 08/06/2014    CREATININE 0.7 08/05/2014    CREATININE 0.7 08/04/2014     BNP:   Lab Results   Component Value Date    PROBNP 196 07/25/2014    PROBNP 351 07/03/2014    PROBNP 981 06/20/2014     PT/INR:   Lab Results   Component  Value Date    PROTIME 15.2 08/01/2014    PROTIME 15.7 07/25/2014    PROTIME 14.7 07/07/2014    INR 1.33 08/01/2014    INR 1.37 07/25/2014    INR 1.28 07/07/2014    INR 1.17 05/14/2010    INR 1.18 02/13/2010    INR 1.15 10/17/2009     FASTING LIPID PANEL:  Lab Results   Component Value Date    CHOL 101 04/02/2014    HDL 22 04/02/2014    HDL 59 12/29/2009    TRIG 83 04/02/2014       Assessment:    1. Chronic systolic CHF (congestive heart failure) ([place])    2. Cirrhosis, nonalcoholic ([place])    3. Non-ischemic cardiomyopathy ([place])    4. Myelodysplasia (myelodysplastic syndrome) ([place])          Plan:   1. Increase the furosemide (Lasix) to 80 mg in AM and 40 mg in PM  2. Start the  eplerenone (Inspra) 50 mg once daily  3. Decrease the carvedilol (Coreg) to 3.125 mg in AM and 6.25 mg in PM  4. Have blood work next week  5. Follow up with me in 2 weeks    I appreciate the opportunity of cooperating in the care of this individual.    Marissa Nestle, M.D., [facility]

## 2014-08-27 ENCOUNTER — Encounter: Admit: 2014-08-27 | Discharge: 2014-08-27 | Payer: MEDICARE | Primary: Internal Medicine

## 2014-08-27 DIAGNOSIS — D469 Myelodysplastic syndrome, unspecified: Secondary | ICD-10-CM

## 2014-08-27 LAB — CBC WITH AUTO DIFFERENTIAL
Granulocytes %: 63.2 % (ref 37.0–80.0)
Granulocytes: 2.2 10*3/uL (ref 2.0–7.8)
Hematocrit: 27.6 % — ABNORMAL LOW (ref 43.5–53.7)
Hemoglobin: 9 g/dL — ABNORMAL LOW (ref 14.1–18.1)
Lymphocyte %: 28.8 % (ref 10.0–50.0)
Lymphocytes: 1 10*3/uL (ref 0.6–4.1)
MCH: 30.9 pg (ref 27.0–31.2)
MCHC: 32.6 g/dL (ref 31.8–35.4)
MCV: 95 fL (ref 80.0–97.0)
Mid Cells %: 8 % (ref 0.1–24.0)
Mid Cells: 0.3 10*3/uL (ref 0.0–1.8)
Platelets: 57 10*3/uL — ABNORMAL LOW (ref 142–424)
RBC: 2.91 M/uL — ABNORMAL LOW (ref 4.69–6.13)
RDW: 15.7 % — ABNORMAL HIGH (ref 11.6–14.8)
WBC: 3.5 10*3/uL — ABNORMAL LOW (ref 4.6–10.2)

## 2014-08-27 MED ORDER — AZACITIDINE 100 MG IJ (MIXTURES ONLY) (OHC)
100 MG | Freq: Once | INTRAMUSCULAR | Status: AC
Start: 2014-08-27 — End: 2014-08-27
  Administered 2014-08-27: 17:00:00 95 mg/m2 via SUBCUTANEOUS

## 2014-08-27 MED ORDER — DEXAMETHASONE 4 MG PO TABS
4 MG | Freq: Once | ORAL | Status: AC
Start: 2014-08-27 — End: 2014-08-27
  Administered 2014-08-27: 17:00:00 8 mg via ORAL

## 2014-08-27 MED ORDER — ONDANSETRON HCL 8 MG PO TABS
8 MG | Freq: Once | ORAL | Status: AC
Start: 2014-08-27 — End: 2014-08-27
  Administered 2014-08-27: 17:00:00 8 mg via ORAL

## 2014-08-27 MED FILL — ONDANSETRON HCL 8 MG PO TABS: 8 MG | ORAL | Qty: 1

## 2014-08-27 MED FILL — AZACITIDINE 100 MG IJ SUSR: 100 MG | INTRAMUSCULAR | Qty: 95

## 2014-08-27 MED FILL — DEXAMETHASONE 4 MG PO TABS: 4 MG | ORAL | Qty: 2

## 2014-08-27 NOTE — Patient Instructions (Signed)
Clinic Discharge Note     Clinic Discharge Note: Treatment was administered as prescribed without adverse event, with physician oversight.;Universal precautions observed with treatment;Patient instructed to call office with any questions or problems.  Discharge Teaching Instructions.;Verify on MAR all medications due today have been administered and documented on.;Return appointment scheduled;Discharged from clinic.    Patients Receiving  Treatment:  Report to your physician's office immediately or call  [phone]:              1.  Fever of 100.5 or greater.                2.  Vomiting without relief after using anti-nausea/vomiting medicine.                3.  Severe abdominal cramping/diarrhea, 3-5 watery bowel movements in a day.                4.  Pain at the IV site where you received your chemotherapy.                5.  Unusual or excessive bleeding from your mouth, nose, rectum, or vagina;  blood in your urine.                6.  A sudden onset of shortness of breath or chest pain.    Report to your physician's office within 24 hours:              1.  Pain not relieved by your usual pain medication.                2.  Signs of infection; redness, warmth, or swelling.                3.  Unusual bruising of any body area.                4.  Change in breathing patterns such as difficulty breathing or shortness of breath when you are resting.                5.  Change in urination; cloudiness, odor, frequency, or pain on urination.    Report to your physician's office within 48 hours:              1.  Skin changes: rash, hives, itching, redness and peeling of the skin.                2.  Numbness, tingling in the extremities or a persistent ringing in the ears.    Practice good personal hygiene.  If possible, avoid persons with colds or flu-like symptoms.  Drink plenty of fluids while awake and avoid exposure to the sun.  It is not unusual to experience other side effects, such as loss of appetite, altered  taste sensation or depression.

## 2014-08-27 NOTE — Telephone Encounter (Signed)
Rodman Key called stating the patient is running late, was unable to transfer the call to the front desk, but they did state [facility] front desk verbalized understanding due to Transportation issues. and the caller was advised.

## 2014-08-27 NOTE — Progress Notes (Signed)
Showed patient's CBC to Aliene Beams CNP, orders written for Vidaza.     Discharge Checklist  Clinic discharge accompanied by Family member    Mode of transportation: Via wheelchair    Clinic Discharge Note:  Treatment was admistered as prescribed without adverse event, with physician oversight.  Universal Precautions observed with treatment.  Documented problems discussed with MD.  Patient instructed to call office with any questions or problems. Discharge Teaching instruction  Patient Physical Status adequate  Verify on flowchart all medications due today have been administered and documented.  Return appointment scheduled  Discharged from clinic

## 2014-08-28 ENCOUNTER — Encounter: Admit: 2014-08-28 | Discharge: 2014-08-28 | Payer: MEDICARE | Primary: Internal Medicine

## 2014-08-28 DIAGNOSIS — D469 Myelodysplastic syndrome, unspecified: Secondary | ICD-10-CM

## 2014-08-28 LAB — CBC WITH AUTO DIFFERENTIAL
Granulocytes %: 66.4 % (ref 37.0–80.0)
Granulocytes: 2.5 10*3/uL (ref 2.0–7.8)
Hematocrit: 27.8 % — ABNORMAL LOW (ref 43.5–53.7)
Hemoglobin: 9.3 g/dL — ABNORMAL LOW (ref 14.1–18.1)
Lymphocyte %: 26.1 % (ref 10.0–50.0)
Lymphocytes: 1 10*3/uL (ref 0.6–4.1)
MCH: 32.3 pg — ABNORMAL HIGH (ref 27.0–31.2)
MCHC: 33.5 g/dL (ref 31.8–35.4)
MCV: 96.4 fL (ref 80.0–97.0)
Mid Cells %: 7.5 % (ref 0.1–24.0)
Mid Cells: 0.3 10*3/uL (ref 0.0–1.8)
Platelets: 64 10*3/uL — ABNORMAL LOW (ref 142–424)
RBC: 2.88 M/uL — ABNORMAL LOW (ref 4.69–6.13)
RDW: 16.7 % — ABNORMAL HIGH (ref 11.6–14.8)
WBC: 3.7 10*3/uL — ABNORMAL LOW (ref 4.6–10.2)

## 2014-08-28 MED ORDER — DEXAMETHASONE 4 MG PO TABS
4 MG | Freq: Once | ORAL | Status: AC
Start: 2014-08-28 — End: 2014-08-28
  Administered 2014-08-28: 17:00:00 4 mg via ORAL

## 2014-08-28 MED ORDER — AZACITIDINE 100 MG IJ (MIXTURES ONLY) (OHC)
100 MG | Freq: Once | INTRAMUSCULAR | Status: AC
Start: 2014-08-28 — End: 2014-08-28
  Administered 2014-08-28: 17:00:00 95 mg/m2 via SUBCUTANEOUS

## 2014-08-28 MED ORDER — ONDANSETRON HCL 8 MG PO TABS
8 MG | Freq: Once | ORAL | Status: AC
Start: 2014-08-28 — End: 2014-08-28
  Administered 2014-08-28: 17:00:00 8 mg via ORAL

## 2014-08-28 MED FILL — DEXAMETHASONE 4 MG PO TABS: 4 MG | ORAL | Qty: 1

## 2014-08-28 MED FILL — AZACITIDINE 100 MG IJ SUSR: 100 MG | INTRAMUSCULAR | Qty: 95

## 2014-08-28 MED FILL — ONDANSETRON HCL 8 MG PO TABS: 8 MG | ORAL | Qty: 1

## 2014-08-28 NOTE — Telephone Encounter (Signed)
Andrew [name] called back from the [facility] called and to ask the Nurse question on the injection to raise his red blood count per the spouse. Please call back.

## 2014-08-28 NOTE — Telephone Encounter (Signed)
Spoke to patient's nurse Rodman Key.  Instructed Rodman Key that I spoke to the patient's wife in office today in regards to the patient's concerns.  The wife stated that the patient had increased confusion last night.  She stated that he has had this in the past but not this bad.  Instructed the wife per Dr. Al Corpus that it could be a side effect from the steroids that we were administering in the office to help with the side effects of the chemotherapy.  Instructed the wife per Dr. Al Corpus that the decadron dose would be decreased to 4mg  instead of 8mg  to see if that would help this evening.  Rodman Key also stated that their facility would do a UA/ C&S to see if the patient had a UTI.  Evorn Gong that our office would follow up with the patient in office tomorrow with his treatment.  Rodman Key verbalized understanding.

## 2014-08-28 NOTE — Patient Instructions (Signed)
Clinic Discharge Note     Clinic Discharge Note: Treatment was administered as prescribed without adverse event, with physician oversight.;Universal precautions observed with treatment;Patient instructed to call office with any questions or problems.  Discharge Teaching Instructions.;Verify on MAR all medications due today have been administered and documented on.;Return appointment scheduled;Discharged from clinic.    Patients Receiving  Treatment:  Report to your physician's office immediately or call  [phone]:              1.  Fever of 100.5 or greater.                2.  Vomiting without relief after using anti-nausea/vomiting medicine.                3.  Severe abdominal cramping/diarrhea, 3-5 watery bowel movements in a day.                4.  Pain at the IV site where you received your chemotherapy.                5.  Unusual or excessive bleeding from your mouth, nose, rectum, or vagina;  blood in your urine.                6.  A sudden onset of shortness of breath or chest pain.    Report to your physician's office within 24 hours:              1.  Pain not relieved by your usual pain medication.                2.  Signs of infection; redness, warmth, or swelling.                3.  Unusual bruising of any body area.                4.  Change in breathing patterns such as difficulty breathing or shortness of breath when you are resting.                5.  Change in urination; cloudiness, odor, frequency, or pain on urination.    Report to your physician's office within 48 hours:              1.  Skin changes: rash, hives, itching, redness and peeling of the skin.                2.  Numbness, tingling in the extremities or a persistent ringing in the ears.    Practice good personal hygiene.  If possible, avoid persons with colds or flu-like symptoms.  Drink plenty of fluids while awake and avoid exposure to the sun.  It is not unusual to experience other side effects, such as loss of appetite, altered  taste sensation or depression.

## 2014-08-28 NOTE — Progress Notes (Signed)
Patient's family member states that patient has a history of "sundowners" but was noted to have increased confusion this morning, Dr. Al Corpus notified, order received to decrease steroid dose to 4 mg. Dr. Al Corpus also notified of patient's CBC result today, no additional orders received.     1251: Premeds administered.  1258: Vidaza administered in 2 separate injections to patient's RUQ.      Discharge Checklist  Clinic discharge accompanied by Family member    Mode of transportation: Via wheelchair    Clinic Discharge Note:  Treatment was admistered as prescribed without adverse event, with physician oversight.  Universal Precautions observed with treatment.  Documented problems discussed with MD.  Patient instructed to call office with any questions or problems. Discharge Teaching instruction  Patient Physical Status adequate  Verify on flowchart all medications due today have been administered and documented.  Return appointment scheduled  Discharged from clinic

## 2014-08-28 NOTE — Telephone Encounter (Signed)
Rodman Key called saying the patient is complaining about the following symptoms; patient experiencing side effects of chemotherapy. Questions regarding what can be done to ease the symptoms or if any additional treatment would be suggested. Please call back to advise.

## 2014-08-29 ENCOUNTER — Encounter: Admit: 2014-08-29 | Discharge: 2014-08-29 | Payer: MEDICARE | Primary: Internal Medicine

## 2014-08-29 DIAGNOSIS — D469 Myelodysplastic syndrome, unspecified: Secondary | ICD-10-CM

## 2014-08-29 LAB — CBC WITH AUTO DIFFERENTIAL
Granulocytes %: 59.7 % (ref 37.0–80.0)
Granulocytes: 2.1 10*3/uL (ref 2.0–7.8)
Hematocrit: 27.8 % — ABNORMAL LOW (ref 43.5–53.7)
Hemoglobin: 9.1 g/dL — ABNORMAL LOW (ref 14.1–18.1)
Lymphocyte %: 33.1 % (ref 10.0–50.0)
Lymphocytes: 1.2 10*3/uL (ref 0.6–4.1)
MCH: 31.4 pg — ABNORMAL HIGH (ref 27.0–31.2)
MCHC: 32.7 g/dL (ref 31.8–35.4)
MCV: 95.8 fL (ref 80.0–97.0)
Mid Cells %: 7.2 % (ref 0.1–24.0)
Mid Cells: 0.3 10*3/uL (ref 0.0–1.8)
Platelets: 59 10*3/uL — ABNORMAL LOW (ref 142–424)
RBC: 2.9 M/uL — ABNORMAL LOW (ref 4.69–6.13)
RDW: 17.2 % — ABNORMAL HIGH (ref 11.6–14.8)
WBC: 3.5 10*3/uL — ABNORMAL LOW (ref 4.6–10.2)

## 2014-08-29 MED ORDER — AZACITIDINE 100 MG IJ (MIXTURES ONLY) (OHC)
100 MG | Freq: Once | INTRAMUSCULAR | Status: AC
Start: 2014-08-29 — End: 2014-08-29
  Administered 2014-08-29: 18:00:00 95 mg/m2 via SUBCUTANEOUS

## 2014-08-29 MED ORDER — ONDANSETRON HCL 8 MG PO TABS
8 MG | Freq: Once | ORAL | Status: AC
Start: 2014-08-29 — End: 2014-08-29
  Administered 2014-08-29: 17:00:00 8 mg via ORAL

## 2014-08-29 MED ORDER — DEXAMETHASONE 4 MG PO TABS
4 MG | Freq: Once | ORAL | Status: AC
Start: 2014-08-29 — End: 2014-08-29
  Administered 2014-08-29: 17:00:00 4 mg via ORAL

## 2014-08-29 MED FILL — DEXAMETHASONE 4 MG PO TABS: 4 MG | ORAL | Qty: 1

## 2014-08-29 MED FILL — AZACITIDINE 100 MG IJ SUSR: 100 MG | INTRAMUSCULAR | Qty: 95

## 2014-08-29 MED FILL — ONDANSETRON HCL 8 MG PO TABS: 8 MG | ORAL | Qty: 1

## 2014-08-29 NOTE — Telephone Encounter (Signed)
Called patient's nursing home in regards to patient receiving Procrit at the nursing home instead of in the office.  Left message with patient's nursing home to contact our office.    May interrupt NN.

## 2014-08-29 NOTE — Progress Notes (Signed)
Routine Vidaza injections, tolerated well. Procrit approval pending Rehab facility and Medicare guidelines.     Discharge Checklist  Clinic discharge accompanied by Spouse    Mode of transportation: Via wheelchair    Clinic Discharge Note:  Treatment was admistered as prescribed without adverse event, with physician oversight.  Universal Precautions observed with treatment.  Patient instructed to call office with any questions or problems. Discharge Teaching instruction  Patient Physical Status adequate  Verify on flowchart all medications due today have been administered and documented.  Return appointment scheduled  Discharged from clinic

## 2014-08-29 NOTE — Patient Instructions (Signed)
Clinic Discharge Note     Clinic Discharge Note: Treatment was administered as prescribed without adverse event, with physician oversight.;Universal precautions observed with treatment;Patient instructed to call office with any questions or problems.  Discharge Teaching Instructions.;Verify on MAR all medications due today have been administered and documented on.;Return appointment scheduled;Discharged from clinic.    Patients Receiving  Treatment:  Report to your physician's office immediately or call  [phone]:              1.  Fever of 100.5 or greater.                2.  Vomiting without relief after using anti-nausea/vomiting medicine.                3.  Severe abdominal cramping/diarrhea, 3-5 watery bowel movements in a day.                4.  Pain at the IV site where you received your chemotherapy.                5.  Unusual or excessive bleeding from your mouth, nose, rectum, or vagina;  blood in your urine.                6.  A sudden onset of shortness of breath or chest pain.    Report to your physician's office within 24 hours:              1.  Pain not relieved by your usual pain medication.                2.  Signs of infection; redness, warmth, or swelling.                3.  Unusual bruising of any body area.                4.  Change in breathing patterns such as difficulty breathing or shortness of breath when you are resting.                5.  Change in urination; cloudiness, odor, frequency, or pain on urination.    Report to your physician's office within 48 hours:              1.  Skin changes: rash, hives, itching, redness and peeling of the skin.                2.  Numbness, tingling in the extremities or a persistent ringing in the ears.    Practice good personal hygiene.  If possible, avoid persons with colds or flu-like symptoms.  Drink plenty of fluids while awake and avoid exposure to the sun.  It is not unusual to experience other side effects, such as loss of appetite, altered  taste sensation or depression.

## 2014-08-29 NOTE — Progress Notes (Signed)
Contacted patient's nursing home to see if patient would be able to receive Procrit at the nursing home.  Spoke to [place] the nurse who stated once our office faxed over the order she would look into it further.

## 2014-08-30 ENCOUNTER — Encounter: Attending: Hematology & Oncology | Primary: Internal Medicine

## 2014-08-30 ENCOUNTER — Encounter: Admit: 2014-08-30 | Discharge: 2014-08-30 | Payer: MEDICARE | Primary: Internal Medicine

## 2014-08-30 ENCOUNTER — Ambulatory Visit
Admit: 2014-08-30 | Discharge: 2014-08-30 | Payer: MEDICARE | Attending: Hematology & Oncology | Primary: Internal Medicine

## 2014-08-30 DIAGNOSIS — D61818 Other pancytopenia: Secondary | ICD-10-CM

## 2014-08-30 DIAGNOSIS — D469 Myelodysplastic syndrome, unspecified: Secondary | ICD-10-CM

## 2014-08-30 LAB — CBC WITH AUTO DIFFERENTIAL
Granulocytes %: 50.8 % (ref 37.0–80.0)
Granulocytes: 1.3 10*3/uL — ABNORMAL LOW (ref 2.0–7.8)
Hematocrit: 28 % — ABNORMAL LOW (ref 43.5–53.7)
Hemoglobin: 9.2 g/dL — ABNORMAL LOW (ref 14.1–18.1)
Lymphocyte %: 41.9 % (ref 10.0–50.0)
Lymphocytes: 1.1 10*3/uL (ref 0.6–4.1)
MCH: 31.1 pg (ref 27.0–31.2)
MCHC: 32.9 g/dL (ref 31.8–35.4)
MCV: 94.5 fL (ref 80.0–97.0)
Mid Cells %: 7.3 % (ref 0.1–24.0)
Mid Cells: 0.2 10*3/uL (ref 0.0–1.8)
Platelets: 65 10*3/uL — ABNORMAL LOW (ref 142–424)
RBC: 2.96 M/uL — ABNORMAL LOW (ref 4.69–6.13)
RDW: 16 % — ABNORMAL HIGH (ref 11.6–14.8)
WBC: 2.6 10*3/uL — ABNORMAL LOW (ref 4.6–10.2)

## 2014-08-30 MED ORDER — AZACITIDINE 100 MG IJ (MIXTURES ONLY) (OHC)
100 MG | Freq: Once | INTRAMUSCULAR | Status: AC
Start: 2014-08-30 — End: 2014-08-30
  Administered 2014-08-30: 18:00:00 95 mg/m2 via SUBCUTANEOUS

## 2014-08-30 MED ORDER — DEXAMETHASONE 4 MG PO TABS
4 MG | Freq: Once | ORAL | Status: AC
Start: 2014-08-30 — End: 2014-08-30
  Administered 2014-08-30: 18:00:00 4 mg via ORAL

## 2014-08-30 MED ORDER — ONDANSETRON HCL 8 MG PO TABS
8 MG | Freq: Once | ORAL | Status: AC
Start: 2014-08-30 — End: 2014-08-30
  Administered 2014-08-30: 18:00:00 8 mg via ORAL

## 2014-08-30 MED FILL — ONDANSETRON HCL 8 MG PO TABS: 8 MG | ORAL | Qty: 1

## 2014-08-30 MED FILL — VIDAZA 100 MG IJ SUSR: 100 MG | INTRAMUSCULAR | Qty: 100

## 2014-08-30 MED FILL — DEXAMETHASONE 4 MG PO TABS: 4 MG | ORAL | Qty: 1

## 2014-08-30 NOTE — Telephone Encounter (Signed)
Spoke to [employer] a Marine scientist with [facility].  Wannetta Sender stated the patient's insurance was dropped and they are in the middle of an appeal.  Wannetta Sender also stated if this is the case the patient may be at home next week and the wife will have to provide transportation.  Trish wanted to know if she can go ahead and schedule patient to come in next week for Procrit.  Instructed Trish that Sharrie Rothman with [facility] has been trying to contact the DON all week at the facility with no call back to discuss patient's insurance and if the nursing home will cover the payment if insurance will not.  Wannetta Sender stated she believed insurance would cover the patient's injection.  Instructed Trish to call our office once patient's insurance appealed the process.  Trish verbalized understanding.

## 2014-08-30 NOTE — Progress Notes (Signed)
Vidaza injections x 2 given to patient . Patient had no complaints.   Discharge Checklist  Clinic discharge accompanied by Family member    Mode of transportation: Via wheelchair    Clinic Discharge Note:  Treatment was admistered as prescribed without adverse event, with physician oversight.  Universal Precautions observed with treatment.  Patient instructed to call office with any questions or problems. Discharge Teaching instruction  Patient Physical Status adequate  Verify on flowchart all medications due today have been administered and documented.  Return appointment scheduled  Discharged from clinic

## 2014-08-30 NOTE — Progress Notes (Signed)
[facility] FOLLOW-UP:     Primary Oncologist: Dr. Eulis Canner  Date: 08/30/2014    PCP: Alecia Lemming, DO  Referring Provider: Alecia Lemming, DO   GI:  Rodney Booze, MD    PROBLEM LIST:     Patient Active Problem List   Diagnosis   ??? Erectile dysfunction   ??? Thrombocytopenia ([place])   ??? Edema   ??? BPH (benign prostatic hyperplasia)   ??? Alpha-1-antitrypsin deficiency   ??? Cirrhosis, nonalcoholic ([place])   ??? Mild CAD   ??? Chronic systolic CHF (congestive heart failure) ([place])   ??? Non-ischemic cardiomyopathy ([place])   ??? Lumbar compression fracture ([place])   ??? Chronic low back pain   ??? Osteopenia   ??? Coagulopathy ([place])   ??? Abnormal EKG   ??? Visual changes   ??? Fixed pupil of right eye   ??? Anemia   ??? Hemispheric carotid artery syndrome   ??? CAD in native artery   ??? Hyperlipidemia   ??? 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 Bronchiectasis without complication ([place])   ??? Benign essential HTN   ??? Hepatic encephalopathy ([facility])   ??? Ataxia   ??? Pancytopenia ([place])   ??? DVT (deep venous thrombosis) ([place])   ??? 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 Pneumonia   ??? Traumatic hematoma of buttock   ??? Pneumonia of both lungs due to infectious organism   ??? Hematoma       Diagnoses:  1.  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   2.  Cirrhosis.   3.  Alpha-1 antitrypsin deficiency.  4.  Hepatic encephalopathy.  5.  Right leg DVT.  Lovenox stopped because of hematoma.  Status post IVC filter.   6.  Idiopathic cardiomyopathy.   7.  [facility].    Current therapy:  Vidaza, C1D5.  Procrit.      INTERVAL HISTORY     The patient returns for follow up.  He is a little confused and his ammonia level was checked at [facility].      REVIEW OF SYSTEMS:     CONSTITUTIONAL: Denies fever, sweats, weight loss.  Fatigue.   EYES: No visual changes or diplopia. No scleral icterus.  ENT: No Headaches, hearing loss or vertigo. No mouth sores or sore throat.  CARDIOVASCULAR: No chest pain, dyspnea on exertion,  palpitations or loss of consciousness.   RESPIRATORY: No cough or wheezing, no sputum production. No hemoptysis.    GASTROINTESTINAL: No abdominal pain, appetite loss, blood in stools. No change in bowel habits.  GENITOURINARY: No dysuria, trouble voiding, or hematuria.  MUSCULOSKELETAL: no generalized weakness. No joint complaints.  INTEGUMENTARY: No rash or pruritus.  NEUROLOGICAL: No headache, diplopia. No change in gait, balance, or coordination. No paresthesia.  Could not see well.    ENDOCRINE: No temperature intolerance. No excessive thirst, fluid intake, or urination.   HEMATOLOGIC/LYMPHATIC: No abnormal bruising or ecchymosis, blood clots or swollen lymph nodes.  ALLERGIC/IMMUNOLOGIC: No nasal congestion or hives.     PHYSICAL EXAM:     Visit Vitals   ??? BP 108/62 (Site: Right Arm, Position: Sitting, Cuff Size: Medium Adult)   ??? Pulse 60   ??? Temp 98.7 ??F (37.1 ??C) (Oral)   ??? Resp 16   ??? Ht _0  (1.651 m)   ??? Wt 167 lb 9.6 oz (76 kg)   ??? BMI 27.89 kg/m2       WEIGHT:  Wt Readings from Last 3 Encounters:   08/30/14 167 lb 9.6 oz (76 kg)   08/29/14 169 lb (76.7 kg)   08/27/14 172 lb 4.8 oz (78.2 kg)     GENERAL APPEARANCE: alert and cooperative.  Alert and oriented to place and person.  HEAD: Normocephalic, without obvious abnormality, atraumatic  NECK: No palpable lymphadenopathy in supraclavicular or cervical chains  LUNGS: Clear to auscultation bilaterally, no audible rales, wheezes or crackles  HEART: Regular rate and rhythm, S1, S2 normal  ABDOMEN: Soft, non-tender; slightly distended. His bowel sounds normal; no masses, no organomegaly  EXTREMITIES: without cyanosis, clubbing, or asymmetry. 1+ pitting edema.  SKIN: No jaundice, purpura or petechiae.  A few ecchymosis.     LABS:     CBC:  Lab Results   Component Value Date    WBC 2.6 (L) 08/30/2014    HGB 9.2 (L) 08/30/2014    HCT 28.0 (L) 08/30/2014    MCV 94.5 08/30/2014    PLT 65 (L) 08/30/2014    LABLYMP 1.1 08/30/2014    MID 0.2 08/30/2014    GRAN  1.3 (L) 08/30/2014    LYMPHOPCT 41.9 08/30/2014    MIDPERCENT 7.3 08/30/2014    GRANULOCYTES 50.8 08/30/2014    RBC 2.96 (L) 08/30/2014    MCH 31.1 08/30/2014    MCHC 32.9 08/30/2014    RDW 16.0 (H) 08/30/2014       Lab Results   Component Value Date    NA 138 08/06/2014    K 4.9 08/06/2014    CL 108 08/06/2014    CO2 25 08/06/2014    BUN 22 (H) 08/06/2014    CREATININE 0.6 (L) 08/06/2014    GLUCOSE 90 08/06/2014    CALCIUM 8.6 08/06/2014    PROT 5.1 (L) 08/05/2014    LABALBU 1.7 (L) 08/05/2014    BILITOT 1.8 (H) 08/05/2014    ALKPHOS 70 08/05/2014    AST 29 08/05/2014    ALT 15 08/05/2014    LABGLOM >60 08/06/2014    GFRAA >60 08/06/2014    AGRATIO 0.4 (L) 07/26/2014    GLOB 3.7 07/26/2014       No results found for: PTINR    TUMOR MARKERS:    Lab Results   Component Value Date    PSA 0.55 08/15/2013     Recent EPO was 383.    IMAGING:     Ct Head Wo Contrast    07/07/2014   EXAMINATION: CT OF THE HEAD WITHOUT CONTRAST  07/07/2014 11:11 pm  TECHNIQUE: CT of the head was performed without the administration of intravenous contrast.  COMPARISON: 04/01/2014  HISTORY: ORDERING SYSTEM PROVIDED HISTORY: dizzy TECHNOLOGIST PROVIDED HISTORY: Ordering Physician Provided Reason for Exam: dizzy  FINDINGS: BRAIN/VENTRICLES: There is no acute intracranial hemorrhage, mass effect or midline shift.  No abnormal extra-axial fluid collection.  The gray-white differentiation is maintained without evidence of an acute infarct.  There is no evidence of hydrocephalus.  ORBITS: The visualized portion of the orbits demonstrate no acute abnormality.  SINUSES: There is minimal mucosal thickening in the left maxillary sinus. The remaining visualized paranasal sinuses are clear.  The mastoid air cells are unremarkable.  SOFT TISSUES/SKULL:  No acute abnormality of the visualized skull or soft tissues.     07/07/2014   IMPRESSION: No acute intracranial abnormality.     US Abdomen Complete    07/08/2014   EXAMINATION: COMPLETE ABDOMINAL ULTRASOUND  07/08/2014 6:04 pm  COMPARISON: None  HISTORY: ORDERING SYSTEM PROVIDED HISTORY:  CIRRHOSIS TECHNOLOGIST PROVIDED HISTORY: Ordering Physician Provided Reason for Exam: cirrhosis, r/o ascites Acuity: Acute Type of Exam: Initial  FINDINGS: Study is limited by body habitus.  LIVER: Evaluation of the liver is limited.  There is diffuse increased echogenicity with no focal lesion noted.  Somewhat nodular contour of the liver is compatible with known cirrhosis.  BILIARY SYSTEM: Limited imaging of the gallbladder is grossly unremarkable.  Common bile duct is within normal limits measuring 6 mm.  KIDNEYS: Right kidney is nonvisualized.  Left kidney measures 10.1 x 4.9 x 5.3 cm and is grossly unremarkable.  PANCREAS: Pancreas is nonvisualized.  SPLEEN: Spleen measures 10.1 cm.  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  IVC: The IVC is patent.  AORTA: Aorta is obscured.  OTHER: No evidence of ascites.     07/08/2014   IMPRESSION: Limited study as above.  Cirrhosis and portal hypertension.  No evidence of ascites.     Cta Pulmonary With Contrast    06/27/2014   EXAMINATION: CTA OF THE CHEST 06/27/2014 2:53 pm  TECHNIQUE: CTA of the chest was performed after the administration of intravenous contrast.  Multiplanar reformatted images are provided for review.  MIP images are provided for review.  Patient received 100 mL Isovue 370 intravenous contrast.  COMPARISON: CT chest with contrast June 13, 2014.  HISTORY: ORDERING SYSTEM PROVIDED HISTORY: DVT (deep venous thrombosis), unspecified laterality [facility]) TECHNOLOGIST PROVIDED HISTORY: Ordering Physician Provided Reason for Exam: DVT, SOB Injury/Trauma or Illness: Illness/Other Acuity: Acute Type of Exam: Initial Relevant Medica/Surgical History: CHF, cardiomyopathy  FINDINGS: Pulmonary Arteries: Pulmonary arteries are adequately opacified for evaluation.  No evidence of intraluminal filling defect to suggest pulmonary embolism.  Main pulmonary artery is normal in caliber.  Mediastinum: No evidence of  mediastinal lymphadenopathy.  Heart is enlarged, similar in appearance.  The heart and pericardium demonstrate no acute abnormality.  There is no acute abnormality of the thoracic aorta. Nodularity of thyroid similar in appearance.  Lungs/pleura: The central airways are patent.  There is a moderate right-sided pleural effusion which appears slightly decreased compared to prior study.  There is mild patchy dependent opacities in the right upper lobe, similar in appearance.  There are dependent subpleural opacities within the lingula and left lower lobe.  There is mild opacity within the right middle lobe which is linear in appearance in appears slightly improved compared to prior study.  2 nodular appearing densities are again seen in the right lower lobe on image number 65, unchanged.  No pneumothorax.  Upper Abdomen: Cirrhotic liver with mildly distended gallbladder is similar in appearance to prior study.  Soft Tissues/Bones: Multiple compression fractures again seen, most pronounced at the thoracolumbar junction, similar in appearance.  Old right-sided rib fractures are also seen.  No acute bone or soft tissue abnormality.  Gynecomastia is again seen.     06/27/2014   IMPRESSION: No evidence of pulmonary embolism.  Moderate right pleural effusion, minimally decreased.  Persistent patchy areas of airspace opacity which may be related to atelectasis versus pneumonia, slightly improved.  2 nodular appearing densities are seen within the right lower lobe which may be related to the airspace disease or represent lung nodules.  Follow-up recommended with dedicated chest CT in 3 months.  Remote compression fractures again seen.  Cirrhotic liver.     Vl Extremity Venous Bilateral    07/09/2014   Vascular Lower Extremities DVT Study Procedure  -- PRELIMINARY SONOGRAPHER REPORT --    Demographics    Patient Name  Sherral Hammers    Date of Study     07/09/2014          Gender              Male    Patient Number     [id number]          Date of Birth       09-17-1940    Visit Number      [id number]         Age                 74 year(s)    Accession Number  [id number]           Room Number         5560    Corporate ID      [id number]            Sonographer         Elberta Fortis,                                                            RVS    Ordering          Barb Merino,   Interpreting        [facility]  Physician         MD                  Physician   Procedure  Type of Study:    Veins:Lower Extremities DVT Study, VL EXTREMITY VENOUS DUPLEX RIGHT.   Tech Comments Right Acute totally occluding deep vein thrombosis involving the right proximal PTV and one gastroc vein zone 5-6. Acute partially occluding deep vein thrombosis involving the popliteal vein and right one gastroc vein zone 5-6. No other evidence of deep vein or superficial vein thrombosis involving the right lower extremity and the left common femoral vein. Calf veins were not well visualized on the right due to patient positioning and edema.  verbal to [employer].    Vl Extremity Venous Bilateral    06/27/2014   Lower Extremities DVT Study   Demographics    Patient Name       MENELIK MCFARREN    Date of Study      06/27/2014        Gender              Male    Patient Number     [id number]        Date of Birth       1940/12/24    Visit Number       [id number]       Age                 69 year(s)    Accession Number   [id number]         Room Number    Corporate ID       [id number]          Sonographer         Elberta Fortis,    RVS    Ordering Physician Joycelyn Das, MD  Interpreting        [facility]   Physician           Wonda Cerise  MD,    [facility]   Procedure  Type of Study:    Veins:Lower Extremities DVT Study, VASC EXTREMITY VENOUS DUPLEX BILATERAL.    Vascular Sonographer Report   Additional Indications:Swelliing  Impressions Right Impression Acute partially occluding deep vein thrombosis involving the right set of gastroc veins zone 5-6. No other  evidence of deep vein or superficial vein thrombosis involving the right lower extremity. Reflux noted in the right FV. Left Impression No evidence of deep vein or superficial vein thrombosis involving the left lower extremity.  Verbal to Dr. Jeb Levering. Patient released.  Conclusions    Summary    -Acute partially occluding deep vein thrombosis involving the right set of  gastroc veins zone 5-6.  -Reflux noted in the right FV.    Signature  Electronically signed by Darlyn Chamber MD, [facility] (Interpreting  physician) on 06/27/2014 at 01:38 PM    Patient Status:Routine. [place] [facility]. Technical Quality:Adequate visualization.  Velocities are measured in cm/s ; Diameters are measured in mm  Right Lower Extremities DVT Study Measurements Right 2D Measurements       STAGING:     No matching staging information was found for the patient.    ASSESSMENT AND PLAN:     1.  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   2.  Cirrhosis.   3.  Alpha-1 antitrypsin deficiency.  4.  Hepatic encephalopathy.  5.  Right leg DVT.  Lovenox stopped because of hematoma.  Status post IVC filter.  6.  Idiopathic cardiomyopathy.     Hematologically stable.  There is no sign of bleeding. He has been tolerating Vidaza.  We will finish this cycle today.  His CBC will be checked weekly at [facility].  He will return on 7/18 to start 2nd cycle of Vidaza.    Eulis Canner, M.D.; M.S.  Medical Oncology/Hematology  Phone: [phone]  Fax: [phone]    [facility]  [address] [place] [address]    [place] [address]    [place] [address]

## 2014-08-30 NOTE — Telephone Encounter (Signed)
Trish Mcknight with [facility] called stating they were returning the nurses phone call. Call was transferred to nurse.

## 2014-08-30 NOTE — Patient Instructions (Signed)
Clinic Discharge Note     Clinic Discharge Note: Treatment was administered as prescribed without adverse event, with physician oversight.;Universal precautions observed with treatment;Patient instructed to call office with any questions or problems.  Discharge Teaching Instructions.;Verify on MAR all medications due today have been administered and documented on.;Return appointment scheduled;Discharged from clinic.    Patients Receiving  Treatment:  Report to your physician's office immediately or call  [phone]:              1.  Fever of 100.5 or greater.                2.  Vomiting without relief after using anti-nausea/vomiting medicine.                3.  Severe abdominal cramping/diarrhea, 3-5 watery bowel movements in a day.                4.  Pain at the IV site where you received your chemotherapy.                5.  Unusual or excessive bleeding from your mouth, nose, rectum, or vagina;  blood in your urine.                6.  A sudden onset of shortness of breath or chest pain.    Report to your physician's office within 24 hours:              1.  Pain not relieved by your usual pain medication.                2.  Signs of infection; redness, warmth, or swelling.                3.  Unusual bruising of any body area.                4.  Change in breathing patterns such as difficulty breathing or shortness of breath when you are resting.                5.  Change in urination; cloudiness, odor, frequency, or pain on urination.    Report to your physician's office within 48 hours:              1.  Skin changes: rash, hives, itching, redness and peeling of the skin.                2.  Numbness, tingling in the extremities or a persistent ringing in the ears.    Practice good personal hygiene.  If possible, avoid persons with colds or flu-like symptoms.  Drink plenty of fluids while awake and avoid exposure to the sun.  It is not unusual to experience other side effects, such as loss of appetite, altered  taste sensation or depression.

## 2014-09-02 ENCOUNTER — Inpatient Hospital Stay: Admit: 2014-09-02 | Primary: Internal Medicine

## 2014-09-02 ENCOUNTER — Inpatient Hospital Stay
Admission: EM | Admit: 2014-09-03 | Discharge: 2014-09-19 | Disposition: A | Discharge status: Home Health | Payer: MEDICARE | Source: Home / Self Care | Admitting: Internal Medicine

## 2014-09-02 ENCOUNTER — Emergency Department: Admit: 2014-09-02 | Primary: Internal Medicine

## 2014-09-02 DIAGNOSIS — S92102A Unspecified fracture of left talus, initial encounter for closed fracture: Principal | ICD-10-CM

## 2014-09-02 LAB — BASIC METABOLIC PANEL
Anion Gap: 2 — ABNORMAL LOW (ref 3–16)
BUN: 40 mg/dL — ABNORMAL HIGH (ref 7–20)
CO2: 38 mmol/L — ABNORMAL HIGH (ref 21–32)
Calcium: 8.8 mg/dL (ref 8.3–10.6)
Chloride: 92 mmol/L — ABNORMAL LOW (ref 99–110)
Creatinine: 1.2 mg/dL (ref 0.8–1.3)
GFR African American: >60 (ref >60)
GFR Non-African American: 60 — AB (ref >60)
Glucose: 117 mg/dL — ABNORMAL HIGH (ref 70–99)
Potassium: 4.1 mmol/L (ref 3.5–5.1)
Sodium: 132 mmol/L — ABNORMAL LOW (ref 136–145)

## 2014-09-02 LAB — CBC WITH AUTO DIFFERENTIAL
Atypical Lymphocytes Relative: 1 % (ref 0–6)
Bands Relative: 3 % (ref 0–7)
Basophils %: 0 %
Basophils Absolute: 0 10*3/uL (ref 0.0–0.2)
Eosinophils %: 0 %
Eosinophils Absolute: 0 10*3/uL (ref 0.0–0.6)
Hematocrit: 27.6 % — ABNORMAL LOW (ref 40.5–52.5)
Hemoglobin: 9 g/dL — ABNORMAL LOW (ref 13.5–17.5)
Lymphocytes %: 25 %
Lymphocytes Absolute: 0.8 10*3/uL — ABNORMAL LOW (ref 1.0–5.1)
MCH: 31.9 pg (ref 26.0–34.0)
MCHC: 32.6 g/dL (ref 31.0–36.0)
MCV: 97.8 fL (ref 80.0–100.0)
MPV: 8.3 fL (ref 5.0–10.5)
Metamyelocytes Relative: 4 % — AB
Monocytes %: 2 %
Monocytes Absolute: 0.1 10*3/uL (ref 0.0–1.3)
Myelocyte Percent: 5 % — AB
Neutrophils %: 60 %
Neutrophils Absolute: 2.2 10*3/uL (ref 1.7–7.7)
PLATELET SLIDE REVIEW: ADEQUATE
Platelets: 41 10*3/uL — ABNORMAL LOW (ref 135–450)
RBC Morphology: NORMAL
RBC: 2.82 M/uL — ABNORMAL LOW (ref 4.20–5.90)
RDW: 21.4 % — ABNORMAL HIGH (ref 12.4–15.4)
WBC: 3 10*3/uL — ABNORMAL LOW (ref 4.0–11.0)

## 2014-09-02 LAB — URINALYSIS WITH REFLEX TO CULTURE
Bilirubin Urine: NEGATIVE
Glucose, Ur: NEGATIVE mg/dL
Ketones, Urine: NEGATIVE mg/dL
Leukocyte Esterase, Urine: NEGATIVE
Nitrite, Urine: NEGATIVE
Protein, UA: NEGATIVE mg/dL
Specific Gravity, UA: 1.014 (ref 1.005–1.030)
Urobilinogen, Urine: 1 E.U./dL (ref <2.0)
pH, UA: 5 (ref 5.0–8.0)

## 2014-09-02 LAB — MAGNESIUM: Magnesium: 2.2 mg/dL (ref 1.80–2.40)

## 2014-09-02 LAB — MICROSCOPIC URINALYSIS
Epithelial Cells, UA: 1 /HPF (ref 0–5)
Hyaline Casts, UA: 7 /HPF (ref 0–8)
RBC, UA: 10 /HPF — ABNORMAL HIGH (ref 0–4)
WBC, UA: 1 /HPF (ref 0–5)

## 2014-09-02 LAB — CK: Total CK: 33 U/L — ABNORMAL LOW (ref 39–308)

## 2014-09-02 LAB — HEPATIC FUNCTION PANEL
ALT: 26 U/L (ref 10–40)
AST: 33 U/L (ref 15–37)
Albumin: 2.2 g/dL — ABNORMAL LOW (ref 3.4–5.0)
Alkaline Phosphatase: 127 U/L (ref 40–129)
Bilirubin, Direct: 0.6 mg/dL — ABNORMAL HIGH (ref 0.0–0.3)
Bilirubin, Indirect: 1.9 mg/dL — ABNORMAL HIGH (ref 0.0–1.0)
Total Bilirubin: 2.5 mg/dL — ABNORMAL HIGH (ref 0.0–1.0)
Total Protein: 6.3 g/dL — ABNORMAL LOW (ref 6.4–8.2)

## 2014-09-02 LAB — PROTIME-INR
INR: 1.41 — ABNORMAL HIGH (ref 0.85–1.16)
Protime: 16.2 s — ABNORMAL HIGH (ref 9.8–13.0)

## 2014-09-02 LAB — TROPONIN: Troponin: 0.02 ng/mL — ABNORMAL HIGH (ref <0.01)

## 2014-09-02 LAB — AMMONIA: Ammonia: 23 umol/L (ref 16–60)

## 2014-09-02 LAB — LACTIC ACID: Lactic Acid: 1 mmol/L (ref 0.4–2.0)

## 2014-09-02 LAB — APTT: aPTT: 30.7 s (ref 25.2–36.4)

## 2014-09-02 MED ORDER — SODIUM CHLORIDE 0.9 % IV BOLUS
0.9 % | Freq: Once | INTRAVENOUS | Status: AC
Start: 2014-09-02 — End: 2014-09-02
  Administered 2014-09-02: 22:00:00 2000 mL via INTRAVENOUS

## 2014-09-02 MED FILL — SODIUM CHLORIDE 0.9 % IV SOLN: 0.9 % | INTRAVENOUS | Qty: 2000

## 2014-09-02 NOTE — Progress Notes (Signed)
Pt admitted and oriented to unit. Pt with some confusion to place and time .Wife to stay all night.Electronically signed by Gwenyth Ober, RN on 09/02/2014 at 11:24 PM

## 2014-09-02 NOTE — ED Notes (Signed)
Report called and given to [name] on 5T, RN verbalized understanding and denied any further need for information, fluids infusing, patient to be transported to unit     Knute Neu, RN  09/02/14 2049

## 2014-09-02 NOTE — Progress Notes (Signed)
Pt very restless. Wife states pt "has sundowners".Electronically signed by Gwenyth Ober, RN on 09/02/2014 at 11:26 PM

## 2014-09-02 NOTE — ED Provider Notes (Signed)
[facility]  eMERGENCY dEPARTMENT eNCOUnter        Pt Name: Andrew [name]  MRN: [id number]  [place] 12-17-1940  Date of evaluation: 09/02/2014  Provider: Brion Aliment, PA-C  PCP: Alba Cory  ED Attending: Gustavus Bryant, [place]       Chief Complaint   Patient presents with   ??? Fall     [facility] by [employer]. was using his walker with wife as stand by and reportedly got weak and fell. per wife his responsiveness has been altered.        HISTORY OF PRESENT ILLNESS   (Location/Symptom, Timing/Onset, Context/Setting, Quality, Duration, Modifying Factors, Severity)  Note limiting factors.     Andrew [name] is a 74 y.o. male  Presents to the emergency department today with [facility].  The wife states that the patient is typically nonverbal, she states that "if you ask him enough, he will eventually talk to you".  The wife states that the patient was walking today with his walker, she states that he fell, she states that his left ankle seemed to get caught up behind him.  He was complaining of left ankle pain.  He states that the patient does have a history of low blood pressure, she states it usually runs in the high 90s to low 100s.  Patient's systolic is 70 upon arrival to the ED.  The wife denies any recent fever or chills.  No recent cough.  No vomiting or diarrhea.  States that he has been "altered" for the past 3 days.    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    Nursing Notes were all reviewed and agreed with or any disagreements were addressed  in the HPI.    REVIEW OF SYSTEMS    (2-9 systems for level 4, 10 or more for level 5)     Review of Systems   Unable to perform ROS  Constitutional: Negative for activity change, appetite change and fever.   Respiratory: Negative for cough.    Gastrointestinal:  Negative for diarrhea and vomiting.       Except as noted above in the ROS, all other systems were reviewed and negative.       PAST MEDICAL HISTORY     Past Medical History   Diagnosis Date   ??? Allergic    ??? Alpha 1 antitrypsin deficiency    ??? Anemia    ??? Arthritis    ??? BPH (benign prostatic hyperplasia)    ??? CAD (coronary artery disease)      Non-obstructive   ??? Cancer ([place])    ??? Chronic systolic CHF (congestive heart failure) ([place]) 10/18/2013   ??? 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 Compression fracture of lumbar vertebra (HCC)      multiple, lumbar   ??? DVT (deep venous thrombosis) ([place])    ??? Glaucoma    ??? Hyperlipidemia    ??? Hypertension    ??? Kidney stones    ??? Leukopenia    ??? Liver disease    ??? 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 Pancytopenia ([place])    ??? Pneumonia    ??? Thrombocytopenia ([place]) 04/30/2009         SURGICAL HISTORY  Past Surgical History   Procedure Laterality Date   ??? Av fistula repair  1970's   ??? Colonoscopy  4/12     5y   ??? Cardiac catheterization     ??? Eye surgery  04/12/2014     Laser Eye Surgery for glaucoma   ??? Cosmetic surgery  04/2013     eye lids   ??? Bone marrow biopsy     ??? Knee surgery           CURRENT MEDICATIONS       Previous Medications    CARVEDILOL (COREG) 3.125 MG TABLET    Take 3.125 mg by mouth every morning Hold if HR <45    CARVEDILOL (COREG) 6.25 MG TABLET    3.125 mg in AM and 6.25 mg in PM    CHOLECALCIFEROL (VITAMIN D3) 1000 UNITS CAPS    Take 1 capsule by mouth daily     CITALOPRAM (CELEXA) 10 MG TABLET    Take 1 tablet by mouth daily    EPLERENONE (INSPRA) 50 MG TABLET    Take 1 tablet by mouth daily    FERROUS SULFATE 325 (65 FE) MG TABLET    Take 325 mg by mouth daily (with breakfast)    FUROSEMIDE (LASIX) 40 MG TABLET    80 mg in AM, 40 mg PM    LACTULOSE 20 GM/30ML SOLN    Take 30 mLs by mouth 2 times daily    MAGNESIUM OXIDE (MAG-OX) 400 (241.3 MG) MG TABS TABLET    TAKE ONE TABLET BY MOUTH TWICE DAILY    MELATONIN  3 MG TABS TABLET    Take 3 mg by mouth daily    MILK THISTLE PO    Take 175 mg by mouth daily     MULTIPLE VITAMINS-MINERALS (CENTRUM SILVER PO)    Take 1 tablet by mouth daily     PROMETHAZINE (PHENERGAN) 25 MG TABLET    Take 0.5-1 tablets by mouth every 6 hours as needed for Nausea    RIFAXIMIN (XIFAXAN) 550 MG TABLET    Take 550 mg by mouth 2 times daily.      SACCHAROMYCES BOULARDII (FLORASTOR) 250 MG CAPSULE    Take 250 mg by mouth 2 times daily    TRAMADOL (ULTRAM) 50 MG TABLET    Take 1 tablet by mouth every 12 hours as needed for Pain    VITAMIN B-12 (CYANOCOBALAMIN) 100 MCG TABLET    Take 50 mcg by mouth daily         ALLERGIES     Spironolactone    FAMILY HISTORY       Family History   Problem Relation Age of Onset   ??? Diabetes Mother    ??? Hypertension Mother    ??? Diabetes Brother    ??? Diabetes Sister           SOCIAL HISTORY       Social History     Social History   ??? Marital status: Married     Spouse name: N/A   ??? Number of children: N/A   ??? Years of education: N/A     Social History Main Topics   ??? Smoking status: Never Smoker   ??? Smokeless tobacco: Never Used      Comment: Passive smoke exposure: yes   ??? Alcohol use No   ??? Drug use: No   ??? Sexual activity: Not on file     Other Topics Concern   ??? Not  on file     Social History Narrative       SCREENINGS             PHYSICAL EXAM    (up to 7 for level 4, 8 or more for level 5)   ED Triage Vitals   BP Temp Temp Source Pulse Resp SpO2 Height Weight   09/02/14 1714 09/02/14 1714 09/02/14 1714 09/02/14 1714 09/02/14 1714 09/02/14 1712 09/02/14 1714 09/02/14 1714   74/43 98.6 ??F (37 ??C) Oral 56 16 93 % $Re'5\' 5"'jtd$  (1.651 m) 167 lb (75.8 kg)       Physical Exam   Constitutional: He is oriented to person, place, and time. He appears well-developed and well-nourished.   HENT:   Head: Normocephalic and atraumatic.   Right Ear: External ear normal.   Left Ear: External ear normal.   Nose: Nose normal.   Eyes: Right eye exhibits no discharge. Left eye exhibits no  discharge.   Neck: Normal range of motion. Neck supple. No tracheal deviation present.   Cardiovascular: Regular rhythm and normal heart sounds.  Bradycardia present.    No murmur heard.  Pulmonary/Chest: Effort normal and breath sounds normal. No respiratory distress. He has no wheezes.   Abdominal: Soft. Bowel sounds are normal. He exhibits no distension. There is no tenderness.   Musculoskeletal: Normal range of motion.   The patient does wince when I palpate diffusely over his left ankle.  Dorsalis pedis and posterior tibialis pulses are 2+.   Neurological: He is alert and oriented to person, place, and time.   Skin: Skin is warm and dry. He is not diaphoretic.   Psychiatric: He has a normal mood and affect. His behavior is normal.   Nursing note and vitals reviewed.      DIAGNOSTIC RESULTS   LABS:    Labs Reviewed   CBC WITH AUTO DIFFERENTIAL - Abnormal; Notable for the following:     WBC 3.0 (*)     RBC 2.82 (*)     Hemoglobin 9.0 (*)     Hematocrit 27.6 (*)     RDW 21.4 (*)     Platelets 41 (*)     Lymphocytes Absolute 0.8 (*)     Metamyelocytes Relative 4 (*)     Myelocytes Relative 5 (*)     Anisocytosis 1+ (*)     Hypochromia 1+ (*)     Poikilocytes 2+ (*)     Ovalocytes 1+ (*)     Tear Drop Cells 1+ (*)     All other components within normal limits   BASIC METABOLIC PANEL - Abnormal; Notable for the following:     Sodium 132 (*)     Chloride 92 (*)     CO2 38 (*)     Anion Gap 2 (*)     Glucose 117 (*)     BUN 40 (*)     GFR Non-African American 60 (*)     All other components within normal limits   HEPATIC FUNCTION PANEL - Abnormal; Notable for the following:     Total Protein 6.3 (*)     Alb 2.2 (*)     Total Bilirubin 2.5 (*)     Bilirubin, Direct 0.6 (*)     Bilirubin, Indirect 1.9 (*)     All other components within normal limits   URINE RT REFLEX TO CULTURE - Abnormal; Notable for the following:     Clarity, UA CLOUDY (*)  Blood, Urine SMALL (*)     All other components within normal limits    TROPONIN - Abnormal; Notable for the following:     Troponin 0.02 (*)     All other components within normal limits   PROTIME-INR - Abnormal; Notable for the following:     Protime 16.2 (*)     INR 1.41 (*)     All other components within normal limits   CK - Abnormal; Notable for the following:     Total CK 33 (*)     All other components within normal limits   MICROSCOPIC URINALYSIS - Abnormal; Notable for the following:     RBC, UA 10 (*)     All other components within normal limits   CULTURE BLOOD #2   CULTURE BLOOD #1   APTT   MAGNESIUM   LACTIC ACID, PLASMA   AMMONIA       All other labs were within normal range or not returned as of this dictation.    EKG: All EKG's are interpreted by the Emergency Department Physician who either signs or Co-signs this chart in the absence of a cardiologist.  Please see their note for interpretation of EKG.      RADIOLOGY:   Non-plain film images such as CT, Ultrasound and MRI are read by the radiologist. Plain radiographic images are visualized and preliminarily interpreted by the  ED Provider with the below findings:    Interpretation per the Radiologist below, if available at the time of this note:    XR Chest Portable   Final Result   Moderate amount of right basilar atelectasis versus pneumonia.  This is very   similar to the comparison.         XR Ankle Left Standard   Final Result   Acute fracture of the talus.         CT Head WO Contrast   Final Result   No acute intracranial abnormality.      Mild cerebral atrophy unchanged.           Xr Ankle Left Standard    Result Date: 09/02/2014  EXAMINATION: 3 VIEWS OF THE LEFT ANKLE  09/02/2014 5:34 pm  COMPARISON: None.  HISTORY: ORDERING SYSTEM PROVIDED HISTORY: injury TECHNOLOGIST PROVIDED HISTORY: Ordering Physician Provided Reason for Exam: [facility] by [employer]. was using his walker with wife as stand by and reportedly got weak and fell. per wife his responsiveness has been altered.  Acuity: Unknown Type of Exam: Initial  FINDINGS: There is an acute mildly displaced fracture along the dorsal margin of the distal talus.  No acute fracture demonstrated elsewhere and no dislocation.     Acute fracture of the talus.     Ct Head Wo Contrast    Result Date: 09/02/2014  EXAMINATION: CT OF THE HEAD WITHOUT CONTRAST  09/02/2014 5:43 pm  TECHNIQUE: CT of the head was performed without the administration of intravenous contrast. Dose modulation, iterative reconstruction, and/or weight based adjustment of the mA/kV was utilized to reduce the radiation dose to as low as reasonably achievable.  COMPARISON: CT brain without contrast 07/07/2014  HISTORY: ORDERING SYSTEM PROVIDED HISTORY: HEAD TRAUMA, CLOSED, MILD, ABN NEURO EXAM AND/OR RISK FACTORS TECHNOLOGIST PROVIDED HISTORY: If patient is on cardiac monitor and/or pulse ox, they may be taken off cardiac monitor and pulse ox, left on O2 if currently on. All monitors reattached when patient returns to room. Ordering Physician Provided Reason for Exam:  ams  FINDINGS: BRAIN/VENTRICLES: There is no acute intracranial hemorrhage, mass effect or midline shift. No abnormal extra-axial fluid collection.  The gray-white differentiation is maintained without evidence of an acute infarct. There is continued mild prominence of the ventricles and sulci due to global parenchymal volume loss.  There are nonspecific areas of hypoattenuation within the periventricular and subcortical white matter, which likely represent chronic microvascular ischemic change.  ORBITS: The visualized portion of the orbits demonstrate no acute abnormality.  SINUSES: The visualized paranasal sinuses and mastoid air cells demonstrate no acute abnormality.  SOFT TISSUES/SKULL: No acute abnormality of the visualized skull or soft tissues.     No acute intracranial abnormality.  Mild cerebral atrophy unchanged.     Xr Chest Portable    Result Date: 09/02/2014  EXAMINATION: SINGLE VIEW OF THE CHEST   09/02/2014 5:34 pm  COMPARISON: Chest x-ray Aug 04, 2014  HISTORY: ORDERING SYSTEM PROVIDED HISTORY: SOB TECHNOLOGIST PROVIDED HISTORY: Ordering Physician Provided Reason for Exam: [facility] by [employer]. was using his walker with wife as stand by and reportedly got weak and fell. per wife his responsiveness has been altered. Acuity: Unknown Type of Exam: Initial  FINDINGS: There is a moderate-sized infiltrate of the right lung base.  No acute pulmonary process demonstrated elsewhere.  No definitive portable chest x-ray evidence of pleural effusion.  No pneumothorax demonstrated.  Cardiac and mediastinal shadows appear stable     Moderate amount of right basilar atelectasis versus pneumonia.  This is very similar to the comparison.         PROCEDURES   Unless otherwise noted below, none     Procedures  Left short leg posterior splint was placed by the emergency department technician, it was applied appropriately and the patient was neurovascularly intact as observed by myself.      CRITICAL CARE TIME   N/A    CONSULTS:  IP CONSULT TO ORTHOPEDIC SURGERY  IP CONSULT TO PRIMARY CARE PROVIDER      EMERGENCY DEPARTMENT COURSE and DIFFERENTIAL DIAGNOSIS/MDM:   Vitals:    Vitals:    09/02/14 1812 09/02/14 1831 09/02/14 1849 09/02/14 1901   BP:  $Re'92/54 93/51 95/56 'GYh$   Pulse: 63 63 68 70   Resp: $Remo'19 18 18 19   'dstTd$ Temp:       TempSrc:       SpO2: 93% 93% 92% 94%   Weight:       Height:           Patient was given the following medications:  Medications   0.9 % sodium chloride bolus (0 mLs Intravenous Stopped 09/02/14 1919)       The pt presents to the emergency department today for [facility] following a [facility].  The wife states that the patient is at baseline typically nonverbal, she states that will talk "if you ask him enough".  She states that today he was walking with his walker, he fell, the left ankle behind him.  She states that his blood pressure is low profile to the ED, she  states that he normally runs in the high 90s/low 100s.  She denies any other symptoms.  On physical exam he is bradycardic.  ER and did disclose to me that there seemed to be some confusion with the patient's clonidine medication, I believe that this could be the cause of his bradycardia.He also does have some wincing when I press on his left ankle.  The wife states  that the patient recently started chemotherapy one week ago, and since then has been somewhat altered.  He is on this for MDS.  Also has a history of cirrhosis of the liver, he is chronically on lactulose.  [place] is normal.  Urine shows no evidence of infection.  CBC shows a leukopenia of 3.0 this is chronic for the patient.  Anemia stable.PA shows a bee when of 40, creatinine of 1.2.  Hepatic function panel shows evidence of cirrhosis.  Troponin is elevated at 0.02, however EKG shows no evidence of acute ST segment elevation or ischemic changes.the left ankle shows acute fracture of the talus.  A chest x-ray is unchanged.  CT of the head shows no acute intracranial abnormality.  Patient was given fluids in the ED, blood pressure is now 95/56.  Discussed the case with orthopedics, Dr. Doreene Eland, he recommends a short leg posterior splint.  The patient is to be nonweightbearing.  With the patient having hypotension, and isn't talus fracture believe that he will need to be admitted for further care and evaluation. Discussed the case with Dr. Shearon Stalls, who agrees to admit the patient. Patient and family are updated and agreeable with plan.      The patient tolerated their visit well.  They were seen and evaluated by the attending physician, Gustavus Bryant, DO who agreed with the assessment and plan.  The patient and / or the family were informed of the results of any tests, a time was given to answer questions, a plan was proposed and they agreed with plan.        FINAL IMPRESSION      1. Closed displaced fracture of left talus, unspecified portion of  talus, initial encounter    2. Hypotension, unspecified hypotension type    3. Elevated troponin    4. Cirrhosis, nonalcoholic ([place])    5. Myelodysplasia (myelodysplastic syndrome) ([place])          DISPOSITION/PLAN   DISPOSITION Decision to Admit    PATIENT REFERRED TO:  No follow-up provider specified.    DISCHARGE MEDICATIONS:  New Prescriptions    No medications on file       DISCONTINUED MEDICATIONS:  Discontinued Medications    No medications on file              (Please note that portions of this note were completed with a voice recognition program.  Efforts were made to edit the dictations but occasionally words are mis-transcribed.)    Brion Aliment, PA-C (electronically signed)           Brion Aliment, PA-C  09/02/14 1935

## 2014-09-02 NOTE — ED Notes (Signed)
Wife out to desk requesting urinal. Patient given urinal to void with assistance of wife. Denies further assistance at this time       Andrew Monte, RN  09/02/14 [place]

## 2014-09-02 NOTE — ED Notes (Signed)
Dr. Shearon Stalls into evaluate patient. With vital signs. IV maintenance fluids started on patient at 159ml/hr for continued trend of blood pressure.      Emerson Monte, RN  09/02/14 2051

## 2014-09-02 NOTE — Progress Notes (Signed)
Patient Active Problem List   Diagnosis   ??? Erectile dysfunction   ??? Thrombocytopenia ([place])   ??? Edema   ??? BPH (benign prostatic hyperplasia)   ??? Alpha-1-antitrypsin deficiency   ??? Cirrhosis, nonalcoholic ([place])   ??? Mild CAD   ??? Chronic systolic CHF (congestive heart failure) ([place])   ??? Non-ischemic cardiomyopathy ([place])   ??? Lumbar compression fracture ([place])   ??? Chronic low back pain   ??? Osteopenia   ??? Coagulopathy ([place])   ??? Abnormal EKG   ??? Visual changes   ??? Fixed pupil of right eye   ??? Anemia   ??? Hemispheric carotid artery syndrome   ??? CAD in native artery   ??? Hyperlipidemia   ??? 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 Bronchiectasis without complication ([place])   ??? Benign essential HTN   ??? Hepatic encephalopathy (HCC)   ??? Ataxia   ??? Pancytopenia ([place])   ??? DVT (deep venous thrombosis) ([place])   ??? Myelodysplasia (myelodysplastic syndrome) ([place])   ??? Pneumonia   ??? Traumatic hematoma of buttock   ??? Pneumonia of both lungs due to infectious organism   ??? Hematoma   ??? Closed nondisplaced fracture of left talus     H&P dictated

## 2014-09-02 NOTE — ED Notes (Signed)
Pt oriented to place and person, color pale, skin warm, resp rate even and equal. Patient slow to formulate answers and respond.  [facility] sent from [facility] for new onset hypotension, falls and altered mental status after starting chemo. Patient initial triage vitals obtained and patient noted to be hypotensive 70s/40s. MDs immediately notified and orders placed for 2L [place]. PA in room to evaluate patient shortly after.  Pt placed on cardiac monitor, is bradycardia , rate is 56.  Bed alarm on for pt safety, rails up x 2, bed in low position.  Family at bedside, call light in reach, will continue to monitor.          Emerson Monte, RN  09/02/14 [phone]

## 2014-09-02 NOTE — ED Provider Notes (Signed)
Attending Supervisory Note/Shared Visit   Physical very pleasant 74 year old male with a known history of cirrhosis,heart failure, cardiac myopathy, chronic back pain, anemia, high cholesterol, hypertension, bronchiectasis or presented today because of a fall in the left ankle pain.    Although the initial history and physical examination information was obtained by the advanced practice provider who also documents a record of this visit, I independently examined the patient and made all diagnostic, treatment and disposition decisions.presents today from home, apparently he was using his walker, his legs, got locked up his legs buckled and he fell down injuring his left foot.  He denies any head injury or loss of consciousness.  He is in chemotherapy for myelodysplastic syndrome, last chemotherapy was on Friday, otherwise been doing quite well, denies any recent sickness or illness, his blood pressure is normally very low this is not new.  He denies any dizziness, lightheadedness, vomiting, bleeding, denies chest pain or palpitations.  On exam vital signs reviewed.  Gen. This is a chronically ill gentleman sitting in the hospital bed.  His note distress, nontoxic pleasant and conversant.  Cardiac S1-S2 regular rate and rhythm without any murmur, rub gallop.  Lungs are clear.  Abdomen soft nontender flat and nondistended bouts of present.  Extremities do show a now a splinted left leg.  I did not remove the splint, the tips of the toes are exposed to the left foot are intact, Refill brisk, sensation is intact to light touch.  At this time he was Marshell Levan today hypotensive, differential would be that of shock from either cardiogenic, obstructive, distributive or hypovolemic standpoint.  Clear this time he does have a left ankle injury.  He is given IV fluids are today.  Labs are performed and reviewed.  Ammonia normal, urine shows small blood otherwise no signs of infection.  Does have pancytopenia consistent with  his MDS.electrolytes unremarkable, elevated BUN probably related to some dehydration which she did receive fluids are today.  He LFTs are stable, troponin elevated here today etiology unclear as he is is no chest pain,coags unremarkable, CT head shows no acute abnormality,x-ray chest shows moderate right-sided basilar atelectasis which is somewhat of previous studies, he has no cough and therefore this was not pretreated with antibiotic's.  X-ray left ankle shows an acute talar fracture.  This times with orthopedics he'll be admitted the hospital.  Aspirin would be administered due to the elevated troponin, given fluids, blood pressure is improved, and is admitted in stable condition.    EKG performed read by myself initially at 1727 demonstrates sinus bradycardia 54 beats a minute.  Axis leftward, R-wave progression is normal.  No pathological ST or T-wave changes noted, J-point elevation in V1 and V2, no other pattern of injury identified.  QTc interval is prolonged.  Compared to EKG on Jul 25, 2014.  J-point elevation was not noted at that time.  No other pattern of injury identified otherwise.        For further details of my evaluation of this patient, please see the advance practice provider's documentation.      Karl Pock, DO  Attending Emergency Physician         Gustavus Bryant, DO  09/02/14 2049

## 2014-09-02 NOTE — ED Notes (Signed)
Bed: 30-01  Expected date:   Expected time:   Means of arrival:   Comments:  squad     Emerson Monte, [place]  09/02/14 [phone]

## 2014-09-02 NOTE — Progress Notes (Deleted)
Patient Active Problem List   Diagnosis   ??? Erectile dysfunction   ??? Thrombocytopenia ([place])   ??? Edema   ??? BPH (benign prostatic hyperplasia)   ??? Alpha-1-antitrypsin deficiency   ??? Cirrhosis, nonalcoholic ([place])   ??? Mild CAD   ??? Chronic systolic CHF (congestive heart failure) ([place])   ??? Non-ischemic cardiomyopathy ([place])   ??? Lumbar compression fracture ([place])   ??? Chronic low back pain   ??? Osteopenia   ??? Coagulopathy ([place])   ??? Hypotension   ??? Abnormal EKG   ??? Visual changes   ??? Fixed pupil of right eye   ??? Anemia   ??? Hemispheric carotid artery syndrome   ??? CAD in native artery   ??? Hyperlipidemia   ??? 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 Bronchiectasis without complication ([place])   ??? Benign essential HTN   ??? Hepatic encephalopathy (H[facility])   ??? Ataxia   ??? Pancytopenia ([place])   ??? DVT (deep venous thrombosis) ([place])   ??? Myelodysplasia (myelodysplastic syndrome) ([place])   ??? Pneumonia   ??? Traumatic hematoma of buttock   ??? Pneumonia of both lungs due to infectious organism   ??? Hematoma   ??? Closed displaced fracture of left talus     H&P dictated

## 2014-09-03 LAB — COMPREHENSIVE METABOLIC PANEL
ALT: 20 U/L (ref 10–40)
AST: 25 U/L (ref 15–37)
Albumin/Globulin Ratio: 0.5 — ABNORMAL LOW (ref 1.1–2.2)
Albumin: 1.8 g/dL — ABNORMAL LOW (ref 3.4–5.0)
Alkaline Phosphatase: 104 U/L (ref 40–129)
Anion Gap: 4 (ref 3–16)
BUN: 34 mg/dL — ABNORMAL HIGH (ref 7–20)
CO2: 34 mmol/L — ABNORMAL HIGH (ref 21–32)
Calcium: 7.7 mg/dL — ABNORMAL LOW (ref 8.3–10.6)
Chloride: 98 mmol/L — ABNORMAL LOW (ref 99–110)
Creatinine: 1 mg/dL (ref 0.8–1.3)
GFR African American: >60 (ref >60)
GFR Non-African American: >60 (ref >60)
Globulin: 3.4 g/dL
Glucose: 104 mg/dL — ABNORMAL HIGH (ref 70–99)
Potassium: 3.5 mmol/L (ref 3.5–5.1)
Sodium: 136 mmol/L (ref 136–145)
Total Bilirubin: 2 mg/dL — ABNORMAL HIGH (ref 0.0–1.0)
Total Protein: 5.2 g/dL — ABNORMAL LOW (ref 6.4–8.2)

## 2014-09-03 LAB — EKG 12-LEAD
Atrial Rate: 54 {beats}/min
P Axis: -29 degrees
P-R Interval: 140 ms
Q-T Interval: 518 ms
QRS Duration: 100 ms
QTc Calculation (Bazett): 491 ms
R Axis: -12 degrees
T Axis: -1 degrees
Ventricular Rate: 54 {beats}/min

## 2014-09-03 LAB — CBC
Hematocrit: 23.2 % — ABNORMAL LOW (ref 40.5–52.5)
Hemoglobin: 7.8 g/dL — ABNORMAL LOW (ref 13.5–17.5)
MCH: 32.7 pg (ref 26.0–34.0)
MCHC: 33.8 g/dL (ref 31.0–36.0)
MCV: 96.8 fL (ref 80.0–100.0)
MPV: 8 fL (ref 5.0–10.5)
Platelets: 28 10*3/uL — ABNORMAL LOW (ref 135–450)
RBC: 2.4 M/uL — ABNORMAL LOW (ref 4.20–5.90)
RDW: 18.3 % — ABNORMAL HIGH (ref 12.4–15.4)
WBC: 2.3 10*3/uL — ABNORMAL LOW (ref 4.0–11.0)

## 2014-09-03 MED ORDER — FERROUS SULFATE 325 (65 FE) MG PO TABS
325 (65 Fe) MG | Freq: Every day | ORAL | Status: DC
Start: 2014-09-03 — End: 2014-09-16
  Administered 2014-09-03 – 2014-09-15 (×9): 325 mg via ORAL

## 2014-09-03 MED ORDER — PROMETHAZINE HCL 25 MG PO TABS
25 MG | Freq: Four times a day (QID) | ORAL | Status: DC | PRN
Start: 2014-09-03 — End: 2014-09-19

## 2014-09-03 MED ORDER — MILK THISTLE 150 MG PO CAPS
150 MG | Freq: Every day | ORAL | Status: DC
Start: 2014-09-03 — End: 2014-09-02

## 2014-09-03 MED ORDER — LORAZEPAM 0.5 MG PO TABS
0.5 MG | Freq: Four times a day (QID) | ORAL | Status: DC | PRN
Start: 2014-09-03 — End: 2014-09-04
  Administered 2014-09-04: 05:00:00 0.5 mg via ORAL

## 2014-09-03 MED ORDER — ASPIRIN 325 MG PO TABS
325 MG | Freq: Once | ORAL | Status: AC
Start: 2014-09-03 — End: 2014-09-02
  Administered 2014-09-03: 01:00:00 325 mg via ORAL

## 2014-09-03 MED ORDER — MELATONIN 3 MG PO TABS
3 MG | Freq: Every day | ORAL | Status: DC
Start: 2014-09-03 — End: 2014-09-02

## 2014-09-03 MED ORDER — CITALOPRAM HYDROBROMIDE 20 MG PO TABS
20 MG | Freq: Every day | ORAL | Status: DC
Start: 2014-09-03 — End: 2014-09-19
  Administered 2014-09-03: 15:00:00 10 mg via ORAL
  Administered 2014-09-04: 14:00:00 20 mg via ORAL
  Administered 2014-09-05 – 2014-09-18 (×11): 10 mg via ORAL

## 2014-09-03 MED ORDER — SACCHAROMYCES BOULARDII 250 MG PO CAPS
250 MG | Freq: Two times a day (BID) | ORAL | Status: DC
Start: 2014-09-03 — End: 2014-09-19
  Administered 2014-09-03 – 2014-09-18 (×26): 250 mg via ORAL

## 2014-09-03 MED ORDER — THERAPEUTIC MULTIVIT/MINERAL PO TABS
Freq: Every day | ORAL | Status: DC
Start: 2014-09-03 — End: 2014-09-19
  Administered 2014-09-03 – 2014-09-18 (×12): 1 via ORAL

## 2014-09-03 MED ORDER — CARVEDILOL 3.125 MG PO TABS
3.125 MG | Freq: Two times a day (BID) | ORAL | Status: DC
Start: 2014-09-03 — End: 2014-09-19
  Administered 2014-09-03 – 2014-09-18 (×22): 3.125 mg via ORAL

## 2014-09-03 MED ORDER — VITAMIN B-12 100 MCG PO TABS
100 MCG | Freq: Every day | ORAL | Status: DC
Start: 2014-09-03 — End: 2014-09-19
  Administered 2014-09-03 – 2014-09-18 (×12): 50 ug via ORAL

## 2014-09-03 MED ORDER — TRAMADOL HCL 50 MG PO TABS
50 MG | Freq: Two times a day (BID) | ORAL | Status: DC | PRN
Start: 2014-09-03 — End: 2014-09-19
  Administered 2014-09-03 – 2014-09-07 (×4): 50 mg via ORAL

## 2014-09-03 MED ORDER — LACTULOSE 10 GM/15ML PO SOLN
10 GM/15ML | Freq: Two times a day (BID) | ORAL | Status: DC
Start: 2014-09-03 — End: 2014-09-04
  Administered 2014-09-03 – 2014-09-04 (×3): 20 g via ORAL

## 2014-09-03 MED ORDER — SODIUM CHLORIDE 0.9 % IV SOLN
0.9 % | INTRAVENOUS | Status: DC
Start: 2014-09-03 — End: 2014-09-11
  Administered 2014-09-03: 11:00:00 via INTRAVENOUS
  Administered 2014-09-03: 100 via INTRAVENOUS
  Administered 2014-09-03 – 2014-09-11 (×9): via INTRAVENOUS

## 2014-09-03 MED ORDER — VITAMIN D 1000 UNITS PO TABS
1000 UNIT | Freq: Every day | ORAL | Status: DC
Start: 2014-09-03 — End: 2014-09-19
  Administered 2014-09-03 – 2014-09-18 (×12): 1000 [IU] via ORAL

## 2014-09-03 MED ORDER — MAGNESIUM OXIDE 400 (241.3 MG) MG PO TABS
400 (241.3 Mg) MG | Freq: Two times a day (BID) | ORAL | Status: DC
Start: 2014-09-03 — End: 2014-09-19
  Administered 2014-09-03 – 2014-09-18 (×24): 400 mg via ORAL

## 2014-09-03 MED ORDER — RIFAXIMIN 550 MG PO TABS
550 MG | Freq: Two times a day (BID) | ORAL | Status: DC
Start: 2014-09-03 — End: 2014-09-19
  Administered 2014-09-03 – 2014-09-18 (×27): 550 mg via ORAL

## 2014-09-03 MED FILL — SODIUM CHLORIDE 0.9 % IV SOLN: 0.9 % | INTRAVENOUS | Qty: 1000

## 2014-09-03 MED FILL — VITAMIN D3 25 MCG (1000 UT) PO TABS: 25 MCG (1000 UT) | ORAL | Qty: 1

## 2014-09-03 MED FILL — THERAPEUTIC MULTIVIT/MINERAL PO TABS: ORAL | Qty: 1

## 2014-09-03 MED FILL — XIFAXAN 550 MG PO TABS: 550 MG | ORAL | Qty: 1

## 2014-09-03 MED FILL — FERROUS SULFATE 325 (65 FE) MG PO TABS: 325 (65 Fe) MG | ORAL | Qty: 1

## 2014-09-03 MED FILL — MAGNESIUM OXIDE 400 (241.3 MG) MG PO TABS: 400 (241.3 Mg) MG | ORAL | Qty: 1

## 2014-09-03 MED FILL — FLORASTOR 250 MG PO CAPS: 250 MG | ORAL | Qty: 1

## 2014-09-03 MED FILL — TRAMADOL HCL 50 MG PO TABS: 50 MG | ORAL | Qty: 1

## 2014-09-03 MED FILL — LACTULOSE 10 GM/15ML PO SOLN: 10 GM/15ML | ORAL | Qty: 30

## 2014-09-03 MED FILL — ASPIRIN 325 MG PO TABS: 325 MG | ORAL | Qty: 1

## 2014-09-03 MED FILL — CITALOPRAM HYDROBROMIDE 20 MG PO TABS: 20 MG | ORAL | Qty: 1

## 2014-09-03 MED FILL — VITAMIN B-12 100 MCG PO TABS: 100 MCG | ORAL | Qty: 1

## 2014-09-03 NOTE — Progress Notes (Signed)
Assessment completed. Patient awake in bed with family at bedside. No acute changes from previous assessment. Call light in reach.

## 2014-09-03 NOTE — ED Provider Notes (Deleted)
PATIENT NAME:                 PA #:            MR #Andrew [name], Andrew [name]                [id number]       [id number]            EMERGENCY ROOM PHYSICIAN:                ADM DATE:                    Antonieta Iba, MD                  09/02/2014                   DATE OF BIRTH:   AGE:           PATIENT TYPE:     RM #:              [birth year]       73             IPF               [id number]                  HISTORY OF PRESENT ILLNESS:  The patient is a 74 year old white gentleman  came to the emergency room with history of fall.  Patient because of general  weakness has been at rehab care.  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  Patient states his legs just gave way and  could not hold his body, and he has contracted severe pain in the left foot.   Now he is unable to walk.  Patient has significant fatigue, poor appetite.   There was no loss of consciousness.  There were no CNS symptoms.      PAST MEDICAL HISTORY:  Pertinent for myelodysplasia, pancytopenia, pneumonia,  renal stones, hypertension, hyperlipidemia, glaucoma, deep vein thrombosis,  compression fracture of the lumbar vertebrae, nonalcoholic steatohepatitis,  chronic systolic heart failure, arteriosclerotic heart disease, benign  prostatic hypertrophy, osteoarthritis, anemia, alpha-1 antitrypsin  deficiency.       History of knee surgery, eye surgery with laser, cosmetic surgery,  colonoscopy, cardiac catheterization, bone marrow biopsy, AV fistula repair.         FAMILY HISTORY:  Pertinent for mother having diabetes and hypertension,  sister also has diabetes, 1 brother is a diabetic.      SOCIAL HISTORY:  Patient is a married man with 3 children who was always a  nonsmoker and always a nondrinker.  He used to work for [employer].     ALLERGIES:  Patient is allergic to SPIRONOLACTONE.     MEDICATIONS:   1.  Carvedilol.  2.  Vitamin D3.  3.   Celexa.  4.  Eplerenone also known as Inspra.    5.  Ferrous sulfate.  6.  Lasix.   7.  Lactulose.  8.  Magnesium oxide.  9.  Melatonin.  10.  Milk thistle.  11.  Multivitamin.  12.  Promethazine.  14.  Rifaximin.    15.  _____   16.  Tramadol.  17.  Vitamin B12.  REVIEW OF SYSTEMS:  Pertinent for general fatigue, malaise, resting and  exertional shortness of breath, no dysphagia, no loss of consciousness, no  TIA, no hematemesis, no melena.  Does have loss of appetite.  There is no  exertional angina.  There is occasional orthopnea.  No paroxysmal nocturnal  dyspnea.  No seizure activity.  No migraines.     PHYSICAL EXAMINATION:      GENERAL:  He is awake, alert, and oriented x3, a very pale and weak-looking  74 year old man looking consistent with his stated age of 38.    VITAL SIGNS:  His temperature is 97.6, his blood pressure is 74/43.  HEENT:  Oral mucosa dry.    SKIN:  Warm and pale.  NECK:  Supple.  Faint carotid bruit.  Mild jugular venous distention.   LUNGS:  Vesicular breath sounds, a few basilar crackles.  HEART:  Regular rate and rhythm.  S1, S2.  Bradycardia.  No gallop rhythm.  ABDOMEN:   Soft, scaphoid, no organomegaly.  EXTREMITIES:  Trace edema.  NEUROLOGIC:  Patient does have tenderness in the dorsum of the left foot with  some swelling, restricted range of motion.    CENTRAL NERVOUS SYSTEM:  No acute focal deficit.  Babinski is bilaterally  absent.     LABORATORY DATA:  Sodium of 132, potassium 4.1, chloride 92, CO2 38, anion  gap 2, BUN 40, creatinine 1.2, alkaline phosphatase 127, total CK 33,  troponin 0.02.  Albumin 2.2, alkaline phosphatase 127, AST 33, bilirubin 0.6,  indirect 1.9, direct 0.6, total 2.5.  Blood sugar 104, total white count is  2.6, hemoglobin 9.2, hematocrit 28, platelet count is 28 now.  Hemoglobin  dropped to 7.8.  PT INR is 16.2 and 1.41.       Patient had a CT scan of the head that did not reveal acute intracranial  finding.  Chest x-ray:  Moderate amount of right  basilar atelectasis versus  pneumonia which is very similar to the comparison x-ray from before.  I do  not believe an acute process is going on for this admission.  Findings are  same as May 1.  X-ray of the ankle and foot shows fracture of the left talus,  acute.       ASSESSMENT:   1.  Hypotension.  2.  Severe anemia.  3.  Pancytopenia.  4.  Talus fracture.  5.  Myelodysplastic syndrome.  6.  Chronic liver disease.  7.  Alpha-1 antitrypsin deficiency.   8.  Chronic systolic heart failure.      PLAN:  Get him admitted, treat him with IV hydration.  Presently will have to  hold eplerenone, carvedilol, and other medications that can potentially lower  the blood pressure.       It is a pleasure to take care of your patients at [facility].                                                Antonieta Iba, MD     [id number]  DD: 09/03/2014 07:41   DT: 09/03/2014 10:11   Job #: [id number]  CC: Alba Cory, MD

## 2014-09-03 NOTE — Consults (Signed)
Department of Orthopedic Surgery  Attending Consult Note        Reason for Consult:  Left talus fracture  Requesting Physician:  Shearon Stalls, MD    CHIEF COMPLAINT:  Left ankle pain    History Obtained From:  patient, family member - wife, electronic medical record    HISTORY OF PRESENT ILLNESS:                The patient is a 74 y.o. male who currently resides at [facility] who was brought to hospital yesterday after a mechanical fall. He complained of left ankle pain after the fall and was brought to he hospital. Xray in the emergency room demonstrated fracture of dorsal aspect of distal talus. Patient was admitted for hypotension in the ED. Usually ambulates with a walker    Past Medical History:        Diagnosis Date   ??? Allergic    ??? Alpha 1 antitrypsin deficiency    ??? Anemia    ??? Arthritis    ??? BPH (benign prostatic hyperplasia)    ??? CAD (coronary artery disease)      Non-obstructive   ??? Cancer ([place])    ??? Chronic systolic CHF (congestive heart failure) ([place]) 10/18/2013   ??? 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 Compression fracture of lumbar vertebra (HCC)      multiple, lumbar   ??? DVT (deep venous thrombosis) ([place])    ??? Glaucoma    ??? Hyperlipidemia    ??? Hypertension    ??? Kidney stones    ??? Leukopenia    ??? Liver disease    ??? Myelodysplasia (myelodysplastic syndrome) ([place]) 07/19/2014   ??? Pancytopenia ([place])    ??? Pneumonia    ??? Thrombocytopenia ([place]) 04/30/2009     Past Surgical History:        Procedure Laterality Date   ??? Av fistula repair  1970's   ??? Colonoscopy  4/12     5y   ??? Cardiac catheterization     ??? Eye surgery  04/12/2014     Laser Eye Surgery for glaucoma   ??? Cosmetic surgery  04/2013     eye lids   ??? Bone marrow biopsy     ??? Knee surgery       PHYSICAL EXAM:    VITALS:    Visit Vitals   ??? BP 98/66   ??? Pulse 68   ??? Temp 97.7 ??F (36.5 ??C) (Oral)   ??? Resp 20   ??? Ht 5' 5" (1.651 m)   ??? Wt 167 lb (75.8 kg)   ??? SpO2 92%   ??? BMI 27.79 kg/m2     Patient lying in bed sleeping. He is easily  awakened but is not answering questions. Wife is present and speaking for patient  Splint in place on left ankle  Able to wiggle toes  Sensation intact  Pain with palpation over dorsal aspect of ankle    DATA:    Radiology Review:       FINDINGS:   There is an acute mildly displaced fracture along the dorsal margin of the   distal talus. ??No acute fracture demonstrated elsewhere and no dislocation.         IMPRESSION/RECOMMENDATIONS:    Left talus fracture:    Would recommend placing patient into a high tide walking boot. Don't know how to get boot to hospital so will most likely have to  place into boot as outpatient. Once in boot I will allow him to weight bear as tolerated. I am in the office tomorrow and Friday so could see the patient either day if discharged by then. Continue splint and non weight bearing in the splint for now until we can get cam walker.  Patient and family were understanding of the plan  Thank you for the consult!

## 2014-09-03 NOTE — Progress Notes (Signed)
Department of Internal Medicine  General Internal Medicine   Progress Note      SUBJECTIVE: feeling better but intermittently confused  With some agitation     History obtained from chart review and the patient  General ROS: positive for  - fatigue and malaise  negative for - chills, fever or night sweats  Psychological ROS: negative  Respiratory ROS: no cough, shortness of breath, or wheezing  Cardiovascular ROS: no chest pain or dyspnea on exertion  Gastrointestinal ROS: no abdominal pain, change in bowel habits, or black or bloody stools  Genito-Urinary ROS: no dysuria, trouble voiding, or hematuria  Musculoskeletal ROS: positive for - foot pain   negative for - joint swelling    OBJECTIVE      Medications      Current Facility-Administered Medications: carvedilol (COREG) tablet 3.125 mg, 3.125 mg, Oral, BID  LORazepam (ATIVAN) tablet 0.5 mg, 0.5 mg, Oral, Q6H PRN  vitamin D (CHOLECALCIFEROL) tablet 1,000 Units, 1,000 Units, Oral, Daily  citalopram (CELEXA) tablet 10 mg, 10 mg, Oral, Daily  ferrous sulfate tablet 325 mg, 325 mg, Oral, Daily with breakfast  lactulose (CHRONULAC) 10 GM/15ML solution 20 g, 20 g, Oral, BID  magnesium oxide (MAG-OX) tablet 400 mg, 400 mg, Oral, BID  therapeutic multivitamin-minerals 1 tablet, 1 tablet, Oral, Daily  promethazine (PHENERGAN) tablet 12.5 mg, 12.5 mg, Oral, Q6H PRN  rifaximin (XIFAXAN) tablet 550 mg, 550 mg, Oral, BID  saccharomyces boulardii (FLORASTOR) capsule 250 mg, 250 mg, Oral, BID  traMADol (ULTRAM) tablet 50 mg, 50 mg, Oral, Q12H PRN  vitamin B-12 (CYANOCOBALAMIN) tablet 50 mcg, 50 mcg, Oral, Daily  0.9 % sodium chloride infusion, , Intravenous, Continuous    Physical      VITALS:    Visit Vitals   ??? BP 102/65   ??? Pulse 70   ??? Temp 98.2 ??F (36.8 ??C) (Oral)   ??? Resp 16   ??? Ht 5\' 5"  (1.651 m)   ??? Wt 167 lb (75.8 kg)   ??? SpO2 93%   ??? BMI 27.79 kg/m2     TEMPERATURE:  Current - Temp: 98.2 ??F (36.8 ??C); Max - Temp  Avg: 97.9 ??F (36.6 ??C)  Min: 97.6 ??F (36.4 ??C)  Max:  98.4 ??F (36.9 ??C)  RESPIRATIONS RANGE: Resp  Avg: 19.6  Min: 16  Max: 22  PULSE RANGE: Pulse  Avg: 68.6  Min: 67  Max: 70  BLOOD PRESSURE RANGE:  Systolic (63KZS), WFU:93 , Min:96 , ATF:573   ; Diastolic (22GUR), KYH:06, Min:60, Max:66    PULSE OXIMETRY RANGE: SpO2  Avg: 93 %  Min: 90 %  Max: 98 %  24HR INTAKE/OUTPUT:    Intake/Output Summary (Last 24 hours) at 09/03/14 2151  Last data filed at 09/03/14 0641   Gross per 24 hour   Intake    900 ml   Output    200 ml   Net    700 ml     CONSTITUTIONAL:  fatigued, alert, uncooperative, distracted, mild distress and appears stated age  NECK:  Mild JVD   BACK:  wnl  LUNGS:  No increased work of breathing, good air exchange, clear to auscultation bilaterally, no crackles or wheezing  CARDIOVASCULAR:  Normal apical impulse, regular rate and rhythm, normal S1 and S2, no S3 or S4, and no murmur noted  ABDOMEN:  Soft non tender BS +  MUSCULOSKELETAL:  Tenderness left foot  NEUROLOGIC:  No acute focal     Data  No results found for: Jaclyn Shaggy, O2SATART    Lab Results   Component Value Date    NA 136 09/03/2014    K 3.5 09/03/2014    CL 98 09/03/2014    CO2 34 09/03/2014    BUN 34 09/03/2014    CREATININE 1.0 09/03/2014    GLUCOSE 104 09/03/2014    CALCIUM 7.7 09/03/2014     Lab Results   Component Value Date    WBC 2.3 09/03/2014    WBC 2.6 08/30/2014    HGB 7.8 09/03/2014    HCT 23.2 09/03/2014    MCV 96.8 09/03/2014    PLT 28 09/03/2014         Lab Results   Component Value Date    INR 1.41 (H) 09/02/2014    PROTIME 16.2 (H) 09/02/2014       ASSESSMENT AND PLAN      Patient Active Problem List   Diagnosis   ??? Erectile dysfunction   ??? Thrombocytopenia ([place])   ??? Edema   ??? BPH (benign prostatic hyperplasia)   ??? Alpha-1-antitrypsin deficiency   ??? Cirrhosis, nonalcoholic ([place])   ??? Mild CAD   ??? Chronic systolic CHF (congestive heart failure) ([place])   ??? Non-ischemic cardiomyopathy ([place])   ??? Lumbar compression fracture ([place])   ??? Chronic low back pain    ??? Osteopenia   ??? Coagulopathy ([place])   ??? Hypotension (arterial)   ??? Abnormal EKG   ??? Visual changes   ??? Fixed pupil of right eye   ??? Anemia   ??? Hemispheric carotid artery syndrome   ??? CAD in native artery   ??? Hyperlipidemia   ??? 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 Bronchiectasis without complication ([place])   ??? Benign essential HTN   ??? Hepatic encephalopathy (HCC)   ??? Ataxia   ??? Pancytopenia ([place])   ??? DVT (deep venous thrombosis) ([place])   ??? Myelodysplasia (myelodysplastic syndrome) ([place])   ??? Pneumonia   ??? Traumatic hematoma of buttock   ??? Pneumonia of both lungs due to infectious organism   ??? Hematoma   ??? Closed nondisplaced fracture of left talus      ortho consult  Noted  Conservative cast management for Talus fracture , restart carvedilol at a lower dose , Cardiology consult to adjust CHF meds

## 2014-09-03 NOTE — Other (Addendum)
Patient Acct Nbr:  [id number]  Primary AUTH/CERT:    [place] Name:   [employer] Rossetti HMO/GE/NEWHEALTH/PREFE/POS  Primary Insurance Plan Name:  [place]  Primary Insurance Group Number:  [id number]  Primary Insurance Plan Type: N  Primary Insurance Policy Number:  [id number]

## 2014-09-03 NOTE — Progress Notes (Signed)
Patient resting in bed. No s/s of distress. Denied any needs at this time. Call light in reach. Family at bedside. Will monitor.

## 2014-09-03 NOTE — Progress Notes (Signed)
Patient asleep in bed. No s/s of distress. Wife at bedside. Call light in reach. Bed alarm on. Will monitor.

## 2014-09-03 NOTE — H&P (Signed)
PATIENT NAME:                 PA #:            MR #Andrew [name], Andrew [name]                [id number]       [id number]            ATTENDING PHYSICIAN:                  ADM DATE:   DIS DATE:          Antonieta Iba, MD               09/02/2014  09/19/2014         DATE OF BIRTH:   AGE:           PATIENT TYPE:     RM #:              08-15-1940       73             IPF               [id number]                  HISTORY OF PRESENT ILLNESS:  The patient is a 74 year old white gentleman  came to the emergency room with history of fall.  Patient because of general  weakness has been at rehab care.  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  Patient states his legs just gave way and  could not hold his body, and he has contracted severe pain in the left foot.   Now he is unable to walk.  Patient has significant fatigue, poor appetite.   There was no loss of consciousness.  There were no CNS symptoms.      PAST MEDICAL HISTORY:  Pertinent for myelodysplasia, pancytopenia, pneumonia,  renal stones, hypertension, hyperlipidemia, glaucoma, deep vein thrombosis,  compression fracture of the lumbar vertebrae, nonalcoholic steatohepatitis,  chronic systolic heart failure, arteriosclerotic heart disease, benign  prostatic hypertrophy, osteoarthritis, anemia, alpha-1 antitrypsin  deficiency.       History of knee surgery, eye surgery with laser, cosmetic surgery,  colonoscopy, cardiac catheterization, bone marrow biopsy, AV fistula repair.         FAMILY HISTORY:  Pertinent for mother having diabetes and hypertension,  sister also has diabetes, 1 brother is a diabetic.      SOCIAL HISTORY:  Patient is a married man with 3 children who was always a  nonsmoker and always a nondrinker.  He used to work for [employer].     ALLERGIES:  Patient is allergic to SPIRONOLACTONE.     MEDICATIONS:   1.  Carvedilol.  2.  Vitamin D3.  3.   Celexa.  4.  Eplerenone also known as Inspra.    5.  Ferrous sulfate.  6.  Lasix.   7.  Lactulose.  8.  Magnesium oxide.  9.  Melatonin.  10.  Milk thistle.  11.  Multivitamin.  12.  Promethazine.  14.  Rifaximin.    15.  _____   16.  Tramadol.  17.  Vitamin B12.     REVIEW OF SYSTEMS:  Pertinent for general fatigue, malaise, resting and  exertional shortness  of breath, no dysphagia, no loss of consciousness, no  TIA, no hematemesis, no melena.  Does have loss of appetite.  There is no  exertional angina.  There is occasional orthopnea.  No paroxysmal nocturnal  dyspnea.  No seizure activity.  No migraines.     PHYSICAL EXAMINATION:      GENERAL:  He is awake, alert, and oriented x3, a very pale and weak-looking  74 year old man looking consistent with his stated age of 28.    VITAL SIGNS:  His temperature is 97.6, his blood pressure is 74/43.  HEENT:  Oral mucosa dry.    SKIN:  Warm and pale.  NECK:  Supple.  Faint carotid bruit.  Mild jugular venous distention.   LUNGS:  Vesicular breath sounds, a few basilar crackles.  HEART:  Regular rate and rhythm.  S1, S2.  Bradycardia.  No gallop rhythm.  ABDOMEN:   Soft, scaphoid, no organomegaly.  EXTREMITIES:  Trace edema.  NEUROLOGIC:  Patient does have tenderness in the dorsum of the left foot with  some swelling, restricted range of motion.    CENTRAL NERVOUS SYSTEM:  No acute focal deficit.  Babinski is bilaterally  absent.     LABORATORY DATA:  Sodium of 132, potassium 4.1, chloride 92, CO2 38, anion  gap 2, BUN 40, creatinine 1.2, alkaline phosphatase 127, total CK 33,  troponin 0.02.  Albumin 2.2, alkaline phosphatase 127, AST 33, bilirubin 0.6,  indirect 1.9, direct 0.6, total 2.5.  Blood sugar 104, total white count is  2.6, hemoglobin 9.2, hematocrit 28, platelet count is 28 now.  Hemoglobin  dropped to 7.8.  PT INR is 16.2 and 1.41.       Patient had a CT scan of the head that did not reveal acute intracranial  finding.  Chest x-ray:  Moderate amount of right  basilar atelectasis versus  pneumonia which is very similar to the comparison x-ray from before.  I do  not believe an acute process is going on for this admission.  Findings are  same as May 1.  X-ray of the ankle and foot shows fracture of the left talus,  acute.       ASSESSMENT:   1.  Hypotension.  2.  Severe anemia.  3.  Pancytopenia.  4.  Talus fracture.  5.  Myelodysplastic syndrome.  6.  Chronic liver disease.  7.  Alpha-1 antitrypsin deficiency.   8.  Chronic systolic heart failure.      PLAN:  Get him admitted, treat him with IV hydration.  Presently will have to  hold eplerenone, carvedilol, and other medications that can potentially lower  the blood pressure.       It is a pleasure to take care of your patients at [facility].                                             Mardene Sayer, MD     [id number]  DD: 09/03/2014 07:41   DT: 09/25/2014 12:22   Job #: [id number]  CC: Mardene Sayer, MD

## 2014-09-03 NOTE — Progress Notes (Signed)
Medicated for c/o pain.Electronically signed by Gwenyth Ober, RN on 09/03/2014 at 3:51 AM

## 2014-09-04 LAB — CBC
Hematocrit: 23 % — ABNORMAL LOW (ref 40.5–52.5)
Hemoglobin: 7.8 g/dL — ABNORMAL LOW (ref 13.5–17.5)
MCH: 32.1 pg (ref 26.0–34.0)
MCHC: 33.8 g/dL (ref 31.0–36.0)
MCV: 95.1 fL (ref 80.0–100.0)
MPV: 8.5 fL (ref 5.0–10.5)
Platelets: 30 10*3/uL — ABNORMAL LOW (ref 135–450)
RBC: 2.42 M/uL — ABNORMAL LOW (ref 4.20–5.90)
RDW: 19.8 % — ABNORMAL HIGH (ref 12.4–15.4)
WBC: 2 10*3/uL — ABNORMAL LOW (ref 4.0–11.0)

## 2014-09-04 LAB — COMPREHENSIVE METABOLIC PANEL
ALT: 22 U/L (ref 10–40)
AST: 29 U/L (ref 15–37)
Albumin/Globulin Ratio: 0.5 — ABNORMAL LOW (ref 1.1–2.2)
Albumin: 1.8 g/dL — ABNORMAL LOW (ref 3.4–5.0)
Alkaline Phosphatase: 110 U/L (ref 40–129)
Anion Gap: 4 (ref 3–16)
BUN: 31 mg/dL — ABNORMAL HIGH (ref 7–20)
CO2: 29 mmol/L (ref 21–32)
Calcium: 8 mg/dL — ABNORMAL LOW (ref 8.3–10.6)
Chloride: 105 mmol/L (ref 99–110)
Creatinine: 0.8 mg/dL (ref 0.8–1.3)
GFR African American: >60 (ref >60)
GFR Non-African American: >60 (ref >60)
Globulin: 3.4 g/dL
Glucose: 113 mg/dL — ABNORMAL HIGH (ref 70–99)
Potassium: 4.2 mmol/L (ref 3.5–5.1)
Sodium: 138 mmol/L (ref 136–145)
Total Bilirubin: 1.4 mg/dL — ABNORMAL HIGH (ref 0.0–1.0)
Total Protein: 5.2 g/dL — ABNORMAL LOW (ref 6.4–8.2)

## 2014-09-04 LAB — AMMONIA: Ammonia: 52 umol/L (ref 16–60)

## 2014-09-04 LAB — C DIFF TOXIN/ANTIGEN: C difficile Toxin, EIA: POSITIVE — AB

## 2014-09-04 MED ORDER — VANCOMYCIN 50 MG/ML ORAL SOLUTION
50 mg/mL | Freq: Three times a day (TID) | ORAL | Status: DC
Start: 2014-09-04 — End: 2014-09-19
  Administered 2014-09-04 – 2014-09-18 (×35): 250 mg via ORAL

## 2014-09-04 MED ORDER — METRONIDAZOLE IN NACL 5-0.79 MG/ML-% IV SOLN
5 mg/mL | Freq: Three times a day (TID) | INTRAVENOUS | Status: DC
Start: 2014-09-04 — End: 2014-09-09
  Administered 2014-09-04 – 2014-09-09 (×15): 500 mg via INTRAVENOUS

## 2014-09-04 MED FILL — TRAMADOL HCL 50 MG PO TABS: 50 MG | ORAL | Qty: 1

## 2014-09-04 MED FILL — FLORASTOR 250 MG PO CAPS: 250 MG | ORAL | Qty: 1

## 2014-09-04 MED FILL — CARVEDILOL 3.125 MG PO TABS: 3.125 MG | ORAL | Qty: 1

## 2014-09-04 MED FILL — FERROUS SULFATE 325 (65 FE) MG PO TABS: 325 (65 Fe) MG | ORAL | Qty: 1

## 2014-09-04 MED FILL — SODIUM CHLORIDE 0.9 % IV SOLN: 0.9 % | INTRAVENOUS | Qty: 1000

## 2014-09-04 MED FILL — LORAZEPAM 0.5 MG PO TABS: 0.5 MG | ORAL | Qty: 1

## 2014-09-04 MED FILL — VANCOMYCIN 50 MG/ML ORAL SOLUTION: 50 mg/mL | ORAL | Qty: 5

## 2014-09-04 MED FILL — MAGNESIUM OXIDE 400 (241.3 MG) MG PO TABS: 400 (241.3 Mg) MG | ORAL | Qty: 1

## 2014-09-04 MED FILL — VITAMIN D3 25 MCG (1000 UT) PO TABS: 25 MCG (1000 UT) | ORAL | Qty: 1

## 2014-09-04 MED FILL — VITAMIN B-12 100 MCG PO TABS: 100 MCG | ORAL | Qty: 1

## 2014-09-04 MED FILL — METRONIDAZOLE IN NACL 5-0.79 MG/ML-% IV SOLN: 5 mg/mL | INTRAVENOUS | Qty: 100

## 2014-09-04 MED FILL — XIFAXAN 550 MG PO TABS: 550 MG | ORAL | Qty: 1

## 2014-09-04 MED FILL — THERAPEUTIC MULTIVIT/MINERAL PO TABS: ORAL | Qty: 1

## 2014-09-04 MED FILL — LACTULOSE 10 GM/15ML PO SOLN: 10 GM/15ML | ORAL | Qty: 30

## 2014-09-04 MED FILL — CITALOPRAM HYDROBROMIDE 20 MG PO TABS: 20 MG | ORAL | Qty: 1

## 2014-09-04 NOTE — Progress Notes (Signed)
Pt incontinent fo stool and urine... Peri care given.. Depends and pad changed.... Stool  Sample sent to lab,, will monitor... Wife bedside... Call light in reach

## 2014-09-04 NOTE — Progress Notes (Signed)
Brief changed and pt repositioned. Full bath and linen change provided. Bed alarm on, call light in reach.

## 2014-09-04 NOTE — Progress Notes (Signed)
Assessment completed and documented... Wife bedside.. Pt incontinent of stool and urine.[name] Pt changed and pad changed.Genevie Cheshire care given... ACe wrqap to splint dry and intact.. Call light in reach. Will monitor... Wife claims that she orders breakfast for pt.Marland KitchenMarland KitchenMarland Kitchen

## 2014-09-04 NOTE — Progress Notes (Signed)
Nutrition Assessment    Type and Reason for Visit: Initial, Positive Nutrition Screen    Nutrition Recommendations:   1. Encourage PO intake.  2. Document all PO intake in flow sheets.    Malnutrition Assessment:  ?? Malnutrition Status: At risk for malnutrition  ?? Context:    ?? Findings of the 6 clinical characteristics of malnutrition (Minimum of 2 out of 6 clinical characteristics is required to make the diagnosis of moderate or severe Protein Calorie Malnutrition based on AND/ASPEN Guidelines):  1. Energy Intake-Not available, not able to assess    2. Weight Loss-10% loss or greater,  (4 months)  3. Fat Loss-Unable to assess,    4. Muscle Loss-Unable to assess,    5. Fluid Accumulation-No significant fluid accumulation,    6. Grip Strength-Not measured    Nutrition Diagnosis:   ?? Problem: Unintended weight loss  ?? Etiology: related to Insufficient energy/nutrient consumption    ??? Signs and symptoms:  as evidenced by  (Weight loss of 14% in 4 months)    Nutrition Assessment:  ?? Subjective Assessment: Positive IP screen for weight loss. Pt sleeping upon RD visit; wife at bedside. Wife reports pt has had some weight loss over the past couple of months. States he has a good appetite at home; generally eats 3 meals daily. Per EMR, pt has lost 27#/14% in the past 4 months. Wife states pt does not drink an ONS at home d/t cirrhosis.  ?? Nutrition-Focused Physical Findings:    ?? Wound Type:    ?? Current Nutrition Therapies:  ?? Oral Diet Orders: General, No Added Salt (3-4gm)   ?? Oral Diet intake: Unable to assess  ?? Oral Nutrition Supplement (ONS) Orders: None  ?? ONS intake: Unable to assess  ?? Anthropometric Measures:  ?? Ht: 5\' 5"  (165.1 cm)   ?? Current Body Wt: 167 lb (75.8 kg)  ?? Usual Body Wt: 188 lb (85.3 kg) (per wife)  ?? Ideal Body Wt: 136 lb (61.7 kg)   ?? BMI Classification: BMI 25.0 - 29.9 Overweight    Estimated Intake vs Estimated Needs: Insufficient Data    Nutrition Risk Level: Moderate, High    Nutrition  Interventions:   Continue current diet  Continued Inpatient Monitoring    Nutrition Evaluation:   ?? Evaluation: Goals set   ?? Goals: Pt will consume >50% of all meal trays.    ?? Monitoring: Meals Intake, Tolerance to Diet, Weight Status    See Adult Nutrition Doc Flowsheet for more detail.     Electronically signed by Nikki Dom. Nithila Sumners, [place], LD on 09/04/14 at 2:18 PM    Contact Number: [phone]

## 2014-09-04 NOTE — Progress Notes (Signed)
Chemo Follow Up - Call    Diagnosis: MDS  Date of first treatment: 08/26/14  Regimen: Vidaza.     Pt seen in office by the treatment suite nurses the following day for chemotherapy.

## 2014-09-04 NOTE — Progress Notes (Signed)
Department of Internal Medicine  General Internal Medicine   Progress Note      SUBJECTIVE: patient quite somnolent  Also has diarrhea  Quite  Fatigued     History obtained from chart review and the patient  General ROS: positive for  - fatigue and malaise  negative for - chills, fever or night sweats  Psychological ROS: negative  Respiratory ROS: no cough, shortness of breath, or wheezing  Cardiovascular ROS: no chest pain or dyspnea on exertion  Gastrointestinal ROS: no abdominal pain, change in bowel habits, or black or bloody stools  Genito-Urinary ROS: no dysuria, trouble voiding, or hematuria  Musculoskeletal ROS: positive for - foot pain   negative for - joint swelling    OBJECTIVE      Medications      Current Facility-Administered Medications: metronidazole (FLAGYL) 500 mg in NaCl 100 mL IVPB premix, 500 mg, Intravenous, Q8H  vancomycin (VANCOCIN) oral solution 250 mg, 250 mg, Oral, 3 times per day  haloperidol lactate (HALDOL) injection 2 mg, 2 mg, Intramuscular, Q6H PRN  carvedilol (COREG) tablet 3.125 mg, 3.125 mg, Oral, BID  vitamin D (CHOLECALCIFEROL) tablet 1,000 Units, 1,000 Units, Oral, Daily  citalopram (CELEXA) tablet 10 mg, 10 mg, Oral, Daily  ferrous sulfate tablet 325 mg, 325 mg, Oral, Daily with breakfast  magnesium oxide (MAG-OX) tablet 400 mg, 400 mg, Oral, BID  therapeutic multivitamin-minerals 1 tablet, 1 tablet, Oral, Daily  promethazine (PHENERGAN) tablet 12.5 mg, 12.5 mg, Oral, Q6H PRN  rifaximin (XIFAXAN) tablet 550 mg, 550 mg, Oral, BID  saccharomyces boulardii (FLORASTOR) capsule 250 mg, 250 mg, Oral, BID  traMADol (ULTRAM) tablet 50 mg, 50 mg, Oral, Q12H PRN  vitamin B-12 (CYANOCOBALAMIN) tablet 50 mcg, 50 mcg, Oral, Daily  0.9 % sodium chloride infusion, , Intravenous, Continuous    Physical      VITALS:    Visit Vitals   ??? BP 102/66   ??? Pulse 71   ??? Temp 97.9 ??F (36.6 ??C) (Axillary)   ??? Resp 18   ??? Ht 5\' 5"  (1.651 m)   ??? Wt 167 lb (75.8 kg)   ??? SpO2 90%   ??? BMI 27.79 kg/m2      TEMPERATURE:  Current - Temp: 97.9 ??F (36.6 ??C); Max - Temp  Avg: 98 ??F (36.7 ??C)  Min: 97.7 ??F (36.5 ??C)  Max: 98.5 ??F (36.9 ??C)  RESPIRATIONS RANGE: Resp  Avg: 16.7  Min: 16  Max: 18  PULSE RANGE: Pulse  Avg: 68.7  Min: 65  Max: 72  BLOOD PRESSURE RANGE:  Systolic (89FYB), OFB:510 , Min:90 , CHE:527   ; Diastolic (78EUM), PNT:61, Min:53, Max:74    PULSE OXIMETRY RANGE: SpO2  Avg: 88.5 %  Min: 84 %  Max: 90 %  24HR INTAKE/OUTPUT:      Intake/Output Summary (Last 24 hours) at 09/04/14 2345  Last data filed at 09/04/14 1754   Gross per 24 hour   Intake    979 ml   Output      0 ml   Net    979 ml     CONSTITUTIONAL:  fatigued, alert, uncooperative, distracted, mild distress and appears stated age  NECK:  Mild JVD   BACK:  wnl  LUNGS:  No increased work of breathing, good air exchange, clear to auscultation bilaterally, no crackles or wheezing  CARDIOVASCULAR:  Normal apical impulse, regular rate and rhythm, normal S1 and S2, no S3 or S4, and no murmur noted  ABDOMEN:  Soft non  tender BS +  MUSCULOSKELETAL:  Tenderness left foot  NEUROLOGIC:  No acute focal     Data      No results found for: PHART, PO2ART, PCO2ART, HCO3ART, BEART, O2SATART    Lab Results   Component Value Date    NA 138 09/04/2014    K 4.2 09/04/2014    CL 105 09/04/2014    CO2 29 09/04/2014    BUN 31 09/04/2014    CREATININE 0.8 09/04/2014    GLUCOSE 113 09/04/2014    CALCIUM 8.0 09/04/2014     Lab Results   Component Value Date    WBC 2.0 09/04/2014    WBC 2.6 08/30/2014    HGB 7.8 09/04/2014    HCT 23.0 09/04/2014    MCV 95.1 09/04/2014    PLT 30 09/04/2014         Lab Results   Component Value Date    INR 1.41 (H) 09/02/2014    PROTIME 16.2 (H) 09/02/2014       ASSESSMENT AND PLAN      Patient Active Problem List   Diagnosis   ??? Erectile dysfunction   ??? Thrombocytopenia ([place])   ??? Edema   ??? BPH (benign prostatic hyperplasia)   ??? Alpha-1-antitrypsin deficiency   ??? Cirrhosis, nonalcoholic ([place])   ??? Mild CAD   ??? Chronic systolic CHF (congestive  heart failure) ([place])   ??? Non-ischemic cardiomyopathy ([place])   ??? Lumbar compression fracture ([place])   ??? Chronic low back pain   ??? Osteopenia   ??? Coagulopathy ([place])   ??? Hypotension   ??? Abnormal EKG   ??? Visual changes   ??? Fixed pupil of right eye   ??? Anemia   ??? Hemispheric carotid artery syndrome   ??? CAD in native artery   ??? Hyperlipidemia   ??? 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 Bronchiectasis without complication ([place])   ??? Benign essential HTN   ??? Hepatic encephalopathy (HCC)   ??? Ataxia   ??? Pancytopenia ([place])   ??? DVT (deep venous thrombosis) ([place])   ??? 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 Pneumonia   ??? Traumatic hematoma of buttock   ??? Pneumonia of both lungs due to infectious organism   ??? Hematoma   ??? Closed displaced fracture of left talus      cardiology follow up  For low BP , start IV flagyl and PO Vancomycin for c diff , avoid Lorazepam  May consider IM Haldol , Heme Onc follow up per Dr Christene Lye

## 2014-09-04 NOTE — Progress Notes (Signed)
Dr. Shearon Stalls made aware of C diff diagnosis...[name]  Awaiting orders

## 2014-09-04 NOTE — Plan of Care (Signed)
Problem: Falls - Risk of  Intervention: Fall risk assessment  Pt high risk for falls  Intervention: Fall precautions        Patient placed on fall Precautions per Leamon Arnt Fall Risk Assessment Scale. Fall risk armband on, orange blanket placed on foot of bed, S.A.F.E. sign displayed at door. Bed in locked and in lowest position, bed alarm armed and audible, call light and bedside table in reach. Patient acknowledges the need to call before getting out of bed.      Intervention: Toileting assistance  Pt incontinent.. Will check and change PRN    Goal: Absence of falls  Outcome: Ongoing  Pt will remain free from falls

## 2014-09-04 NOTE — Progress Notes (Signed)
Pt IV leaking, attempted PIV stick x2.  Routine VSS. Assessment completed and all PM medications passed crushed in applesauce. Declines pain, declines further needs. Bed alarm on, call light in reach. Family at bedside. Will continue to monitor.

## 2014-09-04 NOTE — Plan of Care (Signed)
Problem: Risk for Impaired Skin Integrity  Goal: Tissue integrity - skin and mucous membranes  Structural intactness and normal physiological function of skin and  mucous membranes.   Outcome: Ongoing  Patient is maintaining current skin integrity. Patient self regulates turning and is continent. Skin assessment performed.

## 2014-09-04 NOTE — Progress Notes (Signed)
Dr. Shearon Stalls called regarding pt anxiety. Very restless, pulling at brief, attempting to get OOB. PRN haldol added to [facility]. Given IM for anxiety. Bed alarm on, call light in reach. Routine VSS.

## 2014-09-04 NOTE — Consults (Signed)
[facility]   Progress Note      SUBJECTIVE:      Patient of Dr. Al Corpus. Has MDS and Cirrhosis. He was started on Vidaza last week. Today is C1D10.    He was admitted after a fall and was note to have left Talus fracture for which no intervention is needed.  He also had diarrhea last night and was noted to have + C Diff.    Hematology consult is requested for further evaluation    OBJECTIVE    Physical  VITALS:    Visit Vitals   ??? BP 93/60   ??? Pulse 65   ??? Temp 98.2 ??F (36.8 ??C) (Axillary)   ??? Resp 16   ??? Ht 5\' 5"  (1.651 m)   ??? Wt 167 lb (75.8 kg)   ??? SpO2 90%   ??? BMI 27.79 kg/m2     TEMPERATURE:  Current - Temp: 98.2 ??F (36.8 ??C); Max - Temp  Avg: 98.1 ??F (36.7 ??C)  Min: 97.8 ??F (36.6 ??C)  Max: 98.5 ??F (36.9 ??C)  BLOOD PRESSURE RANGE:  Systolic (10GYI), RSW:546 , Min:90 , EVO:350   ; Diastolic (09FGH), WEX:93, Min:53, Max:74    24HR INTAKE/OUTPUT:    Intake/Output Summary (Last 24 hours) at 09/04/14 1724  Last data filed at 09/04/14 0614   Gross per 24 hour   Intake    600 ml   Output      0 ml   Net    600 ml       Drowsy   HEENT: Pallor +   Neck: Supple. No lymphadenopathy  Lungs: CTA. Respiratory efforts normal.  CVS: S1S2 normal. No murmurs or gallops.  Abdomen: Soft BS +. No organomegaly.   Extremities: No edema  Neuro: No focal deficits.  Skin: No Rash Petechiae      Data  Labs:  General Labs:  CBC with Differential:  Lab Results   Component Value Date    WBC 2.0 09/04/2014    WBC 2.6 08/30/2014    RBC 2.42 09/04/2014    RBC 2.96 08/30/2014    HGB 7.8 09/04/2014    HCT 23.0 09/04/2014    PLT 30 09/04/2014    MCV 95.1 09/04/2014    MCH 32.1 09/04/2014    MCHC 33.8 09/04/2014    RDW 19.8 09/04/2014    SEGSPCT 42.6 05/14/2011    BANDSPCT 3 09/02/2014    METASPCT 4 09/02/2014    LYMPHOPCT 25.0 09/02/2014    LYMPHOPCT 41.9 08/30/2014    MONOPCT 2.0 09/02/2014    MYELOPCT 5 09/02/2014    EOSPCT 2.1 05/14/2010    BASOPCT 0.0 09/02/2014    MONOSABS 0.1 09/02/2014    LYMPHSABS 0.8 09/02/2014    EOSABS 0.0  09/02/2014    BASOSABS 0.0 09/02/2014    DIFFTYPE Scan-K 05/14/2011     BMP:  Lab Results   Component Value Date    NA 138 09/04/2014    K 4.2 09/04/2014    CL 105 09/04/2014    CO2 29 09/04/2014    BUN 31 09/04/2014    LABALBU 1.8 09/04/2014    CREATININE 0.8 09/04/2014    CALCIUM 8.0 09/04/2014    GFRAA >60 09/04/2014    GFRAA >60 05/14/2011    LABGLOM >60 09/04/2014    GLUCOSE 113 09/04/2014     Hepatic Function Panel:  Lab Results   Component Value Date    ALKPHOS 110 09/04/2014    ALT 22 09/04/2014  AST 29 09/04/2014    PROT 5.2 09/04/2014    PROT 6.0 05/14/2011    BILITOT 1.4 09/04/2014    BILIDIR 0.6 09/02/2014    IBILI 1.9 09/02/2014    LABALBU 1.8 09/04/2014     LDH:  No results found for: LDH  PT/INR:    Lab Results   Component Value Date    PROTIME 16.2 09/02/2014    INR 1.41 09/02/2014    INR 1.17 05/14/2010     PTT:    Lab Results   Component Value Date    APTT 30.7 09/02/2014    APTT 29.7 10/17/2009   [APTT    Current Medications  Current Facility-Administered Medications: metronidazole (FLAGYL) 500 mg in NaCl 100 mL IVPB premix, 500 mg, Intravenous, Q8H  vancomycin (VANCOCIN) oral solution 250 mg, 250 mg, Oral, 3 times per day  carvedilol (COREG) tablet 3.125 mg, 3.125 mg, Oral, BID  vitamin D (CHOLECALCIFEROL) tablet 1,000 Units, 1,000 Units, Oral, Daily  citalopram (CELEXA) tablet 10 mg, 10 mg, Oral, Daily  ferrous sulfate tablet 325 mg, 325 mg, Oral, Daily with breakfast  magnesium oxide (MAG-OX) tablet 400 mg, 400 mg, Oral, BID  therapeutic multivitamin-minerals 1 tablet, 1 tablet, Oral, Daily  promethazine (PHENERGAN) tablet 12.5 mg, 12.5 mg, Oral, Q6H PRN  rifaximin (XIFAXAN) tablet 550 mg, 550 mg, Oral, BID  saccharomyces boulardii (FLORASTOR) capsule 250 mg, 250 mg, Oral, BID  traMADol (ULTRAM) tablet 50 mg, 50 mg, Oral, Q12H PRN  vitamin B-12 (CYANOCOBALAMIN) tablet 50 mcg, 50 mcg, Oral, Daily  0.9 % sodium chloride infusion, , Intravenous, Continuous    ASSESSMENT AND PLAN    1. MDS: On  Vidaza C1D10. Monitor counts. Transfuse if Hb < 7 or if Platelet count is < 15    2. C Diff colitis: Flagyl + Vanco.    3. H/O Cirrhosis    Thank you for notification. Will follow.      Mare Loan, MD

## 2014-09-04 NOTE — Consults (Addendum)
[facility]   Cardiac Evaluation      Doren Kaspar  Date of Birth:  [birth year]    Requesting Physician:  Dr. Shearon Stalls      Chief Complaint   Patient presents with   ??? Fall     patient from [facility] brought in by [employer]. was using his walker with wife as stand by and reportedly got weak and fell. per wife his responsiveness has been altered.         History of Present Illness:  Andrew [name] is a 74 y.o. male well known to me who Presents to the emergency department today with his wife from [facility]. The wife states that the patient is typically nonverbal, she states that "if you ask him enough, he will eventually talk to you". The wife states that the patient was walking today with his walker, she states that he fell, she states that his left ankle seemed to get caught up behind him. He was complaining of left ankle pain. He states that the patient does have a history of low blood pressure, she states it usually runs in the high 90s to low 100s. Patient's systolic is 70 upon arrival to the ED. The wife denies any recent fever or chills. No recent cough. No vomiting or diarrhea. States that he has been "altered" for the past 3 days.  ??  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  On arrival to the ED, his blood pressure was noted to be low, although his BP always run low, in the 90's.  Xray of his ankle shows that he has a talus fracture.  Today he was diagnosed with C. Difficile colitis after developing diarrhea.  He recently received antibiotics as treatment for pneumonia.  He has chronic diastolic heart failure.  He has been on eplerenone, lasix, coreg.  His LVEF was 30-35% about a year ago, but improved to 55% 1/16.  ??    Allergies   Allergen Reactions   ??? Spironolactone Other (See Comments)     gynecomastia     Current Facility-Administered Medications   Medication Dose Route  Frequency Provider Last Rate Last Dose   ??? metronidazole (FLAGYL) 500 mg in NaCl 100 mL IVPB premix  500 mg Intravenous Q8H Peggye Pitt, MD 100 mL/hr at 09/04/14 1603 500 mg at 09/04/14 1603   ??? vancomycin (VANCOCIN) oral solution 250 mg  250 mg Oral 3 times per day Peggye Pitt, MD   250 mg at 09/04/14 1603   ??? carvedilol (COREG) tablet 3.125 mg  3.125 mg Oral BID Peggye Pitt, MD   3.125 mg at 09/04/14 2011   ??? vitamin D (CHOLECALCIFEROL) tablet 1,000 Units  1,000 Units Oral Daily Peggye Pitt, MD   1,000 Units at 09/04/14 0945   ??? citalopram (CELEXA) tablet 10 mg  10 mg Oral Daily Peggye Pitt, MD   20 mg at 09/04/14 0945   ??? ferrous sulfate tablet 325 mg  325 mg Oral Daily with breakfast Peggye Pitt, MD   325 mg at 09/04/14 0945   ??? magnesium oxide (MAG-OX) tablet 400 mg  400 mg Oral BID Peggye Pitt, MD   400 mg at 09/04/14 2011   ??? therapeutic multivitamin-minerals 1 tablet  1 tablet Oral Daily Peggye Pitt, MD   1 tablet at 09/04/14 0944   ???  promethazine (PHENERGAN) tablet 12.5 mg  12.5 mg Oral Q6H PRN Sherlyn Hay, MD       ??? rifaximin Burman Blacksmith) tablet 550 mg  550 mg Oral BID Sherlyn Hay, MD   550 mg at 09/04/14 2011   ??? saccharomyces boulardii (FLORASTOR) capsule 250 mg  250 mg Oral BID Sherlyn Hay, MD   250 mg at 09/04/14 2011   ??? traMADol (ULTRAM) tablet 50 mg  50 mg Oral Q12H PRN Sherlyn Hay, MD   50 mg at 09/03/14 2143   ??? vitamin B-12 (CYANOCOBALAMIN) tablet 50 mcg  50 mcg Oral Daily Sherlyn Hay, MD   50 mcg at 09/04/14 0944   ??? 0.9 % sodium chloride infusion   Intravenous Continuous Sherlyn Hay, MD 50 mL/hr at 09/03/14 1959         Past Medical History   Diagnosis Date   ??? Allergic    ??? Alpha 1 antitrypsin deficiency    ??? Anemia    ??? Arthritis    ??? BPH (benign prostatic hyperplasia)    ??? CAD (coronary artery disease)      Non-obstructive   ??? Cancer (HCC)    ??? Chronic systolic CHF (congestive heart failure) (HCC) 10/18/2013   ??? Cirrhosis, nonalcoholic  (HCC)      Due to Alpha-1 vs NASH   ??? Compression fracture of lumbar vertebra (HCC)      multiple, lumbar   ??? DVT (deep venous thrombosis) (HCC)    ??? Glaucoma    ??? Hyperlipidemia    ??? Hypertension    ??? Kidney stones    ??? Leukopenia    ??? Liver disease    ??? 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 Pancytopenia (HCC)    ??? Pneumonia    ??? Thrombocytopenia (HCC) 04/30/2009     Past Surgical History   Procedure Laterality Date   ??? Av fistula repair  1970's   ??? Colonoscopy  4/12     5y   ??? Cardiac catheterization     ??? Eye surgery  04/12/2014     Laser Eye Surgery for glaucoma   ??? Cosmetic surgery  04/2013     eye lids   ??? Bone marrow biopsy     ??? Knee surgery       Family History   Problem Relation Age of Onset   ??? Diabetes Mother    ??? Hypertension Mother    ??? Diabetes Brother    ??? Diabetes Sister      Social History     Social History   ??? Marital status: Married     Spouse name: N/A   ??? Number of children: N/A   ??? Years of education: N/A     Occupational History   ??? Not on file.     Social History Main Topics   ??? Smoking status: Never Smoker   ??? Smokeless tobacco: Never Used      Comment: Passive smoke exposure: yes   ??? Alcohol use No   ??? Drug use: No   ??? Sexual activity: Not on file     Other Topics Concern   ??? Not on file     Social History Narrative       Review of Systems:   ?? Constitutional: there has been no unanticipated weight loss. There's been no change in energy level, sleep pattern, or activity level.     ?? Eyes: No visual changes or diplopia. No  scleral icterus.  ?? ENT: No Headaches, hearing loss or vertigo. No mouth sores or sore throat.  ?? Cardiovascular: Reviewed in HPI  ?? Respiratory: No cough or wheezing, no sputum production. No hematemesis.    ?? Gastrointestinal: No abdominal pain, appetite loss, blood in stools. No change in bowel or bladder habits.  ?? Genitourinary: No dysuria, trouble voiding, or hematuria.  ?? Musculoskeletal:  No gait disturbance, weakness or joint  complaints.  ?? Integumentary: No rash or pruritis.  ?? Neurological: No headache, diplopia, change in muscle strength, numbness or tingling. No change in gait, balance, coordination, mood, affect, memory, mentation, behavior.  ?? Psychiatric: No anxiety, no depression.  ?? Endocrine: No malaise, fatigue or temperature intolerance. No excessive thirst, fluid intake, or urination. No tremor.  ?? Hematologic/Lymphatic: No abnormal bruising or bleeding, blood clots or swollen lymph nodes.  ?? Allergic/Immunologic: No nasal congestion or hives.    Physical Examination:    Vitals:    09/04/14 0751 09/04/14 1100 09/04/14 1258 09/04/14 1954   BP: 1$Rem'23/74 90/53 93/60 'ALRS$ 110/64   Pulse: 69 69 65 72   Resp: $Remo'16 16 16 18   'vabYE$ Temp: 97.8 ??F (36.6 ??C) 98 ??F (36.7 ??C) 98.2 ??F (36.8 ??C) 97.7 ??F (36.5 ??C)   TempSrc: Axillary  Axillary Axillary   SpO2:   90% 90%   Weight:       Height:         Body mass index is 27.79 kg/(m^2).     Wt Readings from Last 3 Encounters:   09/02/14 167 lb (75.8 kg)   08/30/14 167 lb 9.6 oz (76 kg)   08/29/14 169 lb (76.7 kg)     BP Readings from Last 3 Encounters:   09/04/14 110/64   08/30/14 108/62   08/29/14 110/72     Constitutional and General Appearance:   Chronically ill appearing gentleman in NAD  HEENT:  NC/AT  Skin:  Warm, dry  Respiratory:  ?? Normal excursion and expansion without use of accessory muscles  ?? Resp Auscultation: Normal breath sounds without dullness  Cardiovascular:  ?? The apical impulses not displaced  ?? Heart tones are crisp and normal  ?? Cervical veins are not engorged  ?? The carotid upstroke is normal in amplitude and contour without delay or bruit  ?? JVP less than 8 cm H2O  RRR with nl S1 and S2 without m,r,g  ?? Peripheral pulses are symmetrical and full  ?? There is no clubbing, cyanosis of the extremities.  ?? No edema  ?? Femoral Arteries: 2+ and equal  ?? Pedal Pulses: 2+ and equal   Neck:  ?? JVP less than 8 cm H2O  ?? No thyromegaly  Abdomen:  ?? No masses or tenderness  ?? Liver/Spleen:  No Abnormalities Noted  Neurological/Psychiatric:  ?? Alert and oriented in all spheres  ?? Moves all extremities well  ?? Exhibits normal gait balance and coordination  ?? No abnormalities of mood, affect, memory, mentation, or behavior are noted    Lab Results   Component Value Date    NA 138 09/04/2014    NA 136 09/03/2014    NA 132 09/02/2014    K 4.2 09/04/2014    K 3.5 09/03/2014    K 4.1 09/02/2014    BUN 31 09/04/2014    BUN 34 09/03/2014    BUN 40 09/02/2014    CREATININE 0.8 09/04/2014    CREATININE 1.0 09/03/2014    CREATININE 1.2 09/02/2014    GLUCOSE 113  09/04/2014    GLUCOSE 104 09/03/2014     No results found for: BNP  Lab Results   Component Value Date    ALT 22 09/04/2014    ALT 20 09/03/2014    AST 29 09/04/2014    AST 25 09/03/2014     Lab Results   Component Value Date    HGB 7.8 09/04/2014    HGB 7.8 09/03/2014    HCT 23.0 09/04/2014    HCT 23.2 09/03/2014    PLT 30 09/04/2014    PLT 28 09/03/2014     Lab Results   Component Value Date    TRIG 83 04/02/2014    TRIG 145 12/29/2009    HDL 22 04/02/2014    HDL 59 12/29/2009    LDLCALC 62 04/02/2014    LDLCALC 84 12/29/2009       Assessment/Plan:    1. Closed displaced fracture of left talus, unspecified portion of talus, initial encounter    2. Hypotension, unspecified hypotension type    3. Elevated troponin    4. Cirrhosis, nonalcoholic ([place])    5. 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  Chronic diastolic heart failure    Continue to hold eplerenone and lasix, especially now that he has diarrhea.  Continue coreg at a lower dose of 3.125 mg po bid  Cautious fluid replacement, being careful not to give too much fluids.  Will follow with you.    I appreciate the opportunity of cooperating in the care of this patient.    Marissa Nestle, M.D., [facility]

## 2014-09-05 LAB — MANUAL DIFFERENTIAL
Basophils %: 0 %
Basophils Absolute: 0 10*3/uL (ref 0.0–0.2)
Eosinophils %: 0 %
Eosinophils Absolute: 0 10*3/uL (ref 0.0–0.6)
Lymphocytes %: 42 %
Lymphocytes Absolute: 0.8 10*3/uL — ABNORMAL LOW (ref 1.0–5.1)
Monocytes %: 0 %
Monocytes Absolute: 0 10*3/uL (ref 0.0–1.3)
Neutrophils %: 58 %
Neutrophils Absolute: 1.1 10*3/uL — ABNORMAL LOW (ref 1.7–7.7)
WBC: 1.9 10*3/uL — CL (ref 4.0–11.0)

## 2014-09-05 LAB — CBC
Hematocrit: 22.2 % — ABNORMAL LOW (ref 40.5–52.5)
Hemoglobin: 7.2 g/dL — ABNORMAL LOW (ref 13.5–17.5)
MCH: 32.1 pg (ref 26.0–34.0)
MCHC: 32.5 g/dL (ref 31.0–36.0)
MCV: 98.6 fL (ref 80.0–100.0)
MPV: 8.7 fL (ref 5.0–10.5)
Platelets: 29 10*3/uL — ABNORMAL LOW (ref 135–450)
RBC: 2.25 M/uL — ABNORMAL LOW (ref 4.20–5.90)
RDW: 21.8 % — ABNORMAL HIGH (ref 12.4–15.4)
WBC: 1.9 10*3/uL — CL (ref 4.0–11.0)

## 2014-09-05 LAB — PERIPHERAL BLOOD SMEAR, PATH REVIEW

## 2014-09-05 MED ORDER — HALOPERIDOL LACTATE 5 MG/ML IJ SOLN
5 MG/ML | INTRAMUSCULAR | Status: DC | PRN
Start: 2014-09-05 — End: 2014-09-10
  Administered 2014-09-05 – 2014-09-10 (×26): 2 mg via INTRAMUSCULAR

## 2014-09-05 MED ORDER — HALOPERIDOL LACTATE 5 MG/ML IJ SOLN
5 MG/ML | Freq: Four times a day (QID) | INTRAMUSCULAR | Status: DC | PRN
Start: 2014-09-05 — End: 2014-09-05
  Administered 2014-09-05 (×2): 2 mg via INTRAMUSCULAR

## 2014-09-05 MED ORDER — NORMAL SALINE FLUSH 0.9 % IV SOLN
0.9 % | INTRAVENOUS | Status: AC
Start: 2014-09-05 — End: 2014-09-05
  Administered 2014-09-05: 20:00:00

## 2014-09-05 MED FILL — CITALOPRAM HYDROBROMIDE 20 MG PO TABS: 20 MG | ORAL | Qty: 1

## 2014-09-05 MED FILL — HALOPERIDOL LACTATE 5 MG/ML IJ SOLN: 5 MG/ML | INTRAMUSCULAR | Qty: 1

## 2014-09-05 MED FILL — VITAMIN B-12 100 MCG PO TABS: 100 MCG | ORAL | Qty: 1

## 2014-09-05 MED FILL — NORMAL SALINE FLUSH 0.9 % IV SOLN: 0.9 % | INTRAVENOUS | Qty: 20

## 2014-09-05 MED FILL — MAGNESIUM OXIDE 400 (241.3 MG) MG PO TABS: 400 (241.3 Mg) MG | ORAL | Qty: 1

## 2014-09-05 MED FILL — SODIUM CHLORIDE 0.9 % IV SOLN: 0.9 % | INTRAVENOUS | Qty: 1000

## 2014-09-05 MED FILL — VANCOMYCIN 50 MG/ML ORAL SOLUTION: 50 mg/mL | ORAL | Qty: 5

## 2014-09-05 MED FILL — METRONIDAZOLE IN NACL 5-0.79 MG/ML-% IV SOLN: 5 mg/mL | INTRAVENOUS | Qty: 100

## 2014-09-05 MED FILL — TRAMADOL HCL 50 MG PO TABS: 50 MG | ORAL | Qty: 1

## 2014-09-05 MED FILL — THERAPEUTIC MULTIVIT/MINERAL PO TABS: ORAL | Qty: 1

## 2014-09-05 MED FILL — CARVEDILOL 3.125 MG PO TABS: 3.125 MG | ORAL | Qty: 1

## 2014-09-05 MED FILL — FLORASTOR 250 MG PO CAPS: 250 MG | ORAL | Qty: 1

## 2014-09-05 MED FILL — VITAMIN D3 25 MCG (1000 UT) PO TABS: 25 MCG (1000 UT) | ORAL | Qty: 1

## 2014-09-05 MED FILL — FERROUS SULFATE 325 (65 FE) MG PO TABS: 325 (65 Fe) MG | ORAL | Qty: 1

## 2014-09-05 MED FILL — XIFAXAN 550 MG PO TABS: 550 MG | ORAL | Qty: 1

## 2014-09-05 NOTE — Progress Notes (Signed)
Routine VSS. Assessment completed and documented in flowsheets. Pt repositioned and brief changed. Wife and daughter at bedside. All medications passed in applesauce. Bed alarm on, call light in reach.

## 2014-09-05 NOTE — Progress Notes (Signed)
Pt sleeping quietly in bed. Respirations even and unlabored. No signs of distress. Bed alarm on, call light in reach.

## 2014-09-05 NOTE — Progress Notes (Signed)
Department of Internal Medicine  General Internal Medicine   Progress Note      SUBJECTIVE: diarrhea  Frequency decreased , still intermittent agitation and delirium     History obtained from chart review and the patient  General ROS: positive for  - fatigue and malaise  negative for - chills, fever or night sweats  Psychological ROS: negative  Respiratory ROS: no cough, shortness of breath, or wheezing  Cardiovascular ROS: no chest pain or dyspnea on exertion  Gastrointestinal ROS: no abdominal pain, change in bowel habits, or black or bloody stools  Genito-Urinary ROS: no dysuria, trouble voiding, or hematuria  Musculoskeletal ROS: positive for - foot pain   negative for - joint swelling    OBJECTIVE      Medications      Current Facility-Administered Medications: haloperidol lactate (HALDOL) injection 2 mg, 2 mg, Intramuscular, Q4H PRN  metronidazole (FLAGYL) 500 mg in NaCl 100 mL IVPB premix, 500 mg, Intravenous, Q8H  vancomycin (VANCOCIN) oral solution 250 mg, 250 mg, Oral, 3 times per day  carvedilol (COREG) tablet 3.125 mg, 3.125 mg, Oral, BID  vitamin D (CHOLECALCIFEROL) tablet 1,000 Units, 1,000 Units, Oral, Daily  citalopram (CELEXA) tablet 10 mg, 10 mg, Oral, Daily  ferrous sulfate tablet 325 mg, 325 mg, Oral, Daily with breakfast  magnesium oxide (MAG-OX) tablet 400 mg, 400 mg, Oral, BID  therapeutic multivitamin-minerals 1 tablet, 1 tablet, Oral, Daily  promethazine (PHENERGAN) tablet 12.5 mg, 12.5 mg, Oral, Q6H PRN  rifaximin (XIFAXAN) tablet 550 mg, 550 mg, Oral, BID  saccharomyces boulardii (FLORASTOR) capsule 250 mg, 250 mg, Oral, BID  traMADol (ULTRAM) tablet 50 mg, 50 mg, Oral, Q12H PRN  vitamin B-12 (CYANOCOBALAMIN) tablet 50 mcg, 50 mcg, Oral, Daily  0.9 % sodium chloride infusion, , Intravenous, Continuous    Physical      VITALS:    Visit Vitals   ??? BP 120/79   ??? Pulse 79   ??? Temp 98.2 ??F (36.8 ??C) (Oral)   ??? Resp 18   ??? Ht 5\' 5"  (1.651 m)   ??? Wt 170 lb 8 oz (77.3 kg)   ??? SpO2 92%   ??? BMI  28.37 kg/m2     TEMPERATURE:  Current - Temp: 98.2 ??F (36.8 ??C); Max - Temp  Avg: 97.9 ??F (36.6 ??C)  Min: 97.6 ??F (36.4 ??C)  Max: 98.2 ??F (36.8 ??C)  RESPIRATIONS RANGE: Resp  Avg: 18.3  Min: 18  Max: 20  PULSE RANGE: Pulse  Avg: 72.3  Min: 69  Max: 79  BLOOD PRESSURE RANGE:  Systolic (27CWC), BJS:283 , Min:101 , TDV:761   ; Diastolic (60VPX), TGG:26, Min:63, Max:79    PULSE OXIMETRY RANGE: SpO2  Avg: 91.5 %  Min: 90 %  Max: 93 %  24HR INTAKE/OUTPUT:      Intake/Output Summary (Last 24 hours) at 09/05/14 2247  Last data filed at 09/05/14 0447   Gross per 24 hour   Intake    432 ml   Output      0 ml   Net    432 ml     CONSTITUTIONAL:  fatigued, alert, uncooperative, distracted, mild distress and appears stated age  NECK:  Mild JVD   BACK:  wnl  LUNGS:  No increased work of breathing, good air exchange, clear to auscultation bilaterally, no crackles or wheezing  CARDIOVASCULAR:  Normal apical impulse, regular rate and rhythm, normal S1 and S2, no S3 or S4, and no murmur noted  ABDOMEN:  Soft  non tender BS +  MUSCULOSKELETAL:  Tenderness left foot  NEUROLOGIC:  No acute focal     Data      No results found for: PHART, PO2ART, PCO2ART, HCO3ART, BEART, O2SATART    Lab Results   Component Value Date    NA 138 09/04/2014    K 4.2 09/04/2014    CL 105 09/04/2014    CO2 29 09/04/2014    BUN 31 09/04/2014    CREATININE 0.8 09/04/2014    GLUCOSE 113 09/04/2014    CALCIUM 8.0 09/04/2014     Lab Results   Component Value Date    WBC 1.9 09/05/2014    WBC 2.6 08/30/2014    HGB 7.2 09/05/2014    HCT 22.2 09/05/2014    MCV 98.6 09/05/2014    PLT 29 09/05/2014         Lab Results   Component Value Date    INR 1.41 (H) 09/02/2014    PROTIME 16.2 (H) 09/02/2014       ASSESSMENT AND PLAN      Patient Active Problem List   Diagnosis   ??? Erectile dysfunction   ??? Thrombocytopenia ([place])   ??? Edema   ??? BPH (benign prostatic hyperplasia)   ??? Alpha-1-antitrypsin deficiency   ??? Cirrhosis, nonalcoholic ([place])   ??? Mild CAD   ??? Chronic systolic  CHF (congestive heart failure) ([place])   ??? Non-ischemic cardiomyopathy ([place])   ??? Lumbar compression fracture ([place])   ??? Chronic low back pain   ??? Osteopenia   ??? Coagulopathy ([place])   ??? Hypotension   ??? Abnormal EKG   ??? Visual changes   ??? Fixed pupil of right eye   ??? Anemia   ??? Hemispheric carotid artery syndrome   ??? CAD in native artery   ??? Hyperlipidemia   ??? Idiopathic cardiomyopathy ([place])   ??? Bronchiectasis without complication ([place])   ??? Benign essential HTN   ??? Hepatic encephalopathy (HCC)   ??? Ataxia   ??? Pancytopenia ([place])   ??? DVT (deep venous thrombosis) ([place])   ??? 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 Pneumonia   ??? Traumatic hematoma of buttock   ??? Pneumonia of both lungs due to infectious organism   ??? Hematoma   ??? Closed displaced fracture of left talus      ct management for c diff , prn  IM Haldol , discussed with wife Hematology group to decide on transfusion

## 2014-09-05 NOTE — Progress Notes (Signed)
Complete bed change and incontinent care completed noted small pinhole size wound to Right groin fold . Pt wife stated from recent filter placement was closed but re-opened, charge Nurse notified, cover area with gauze and tegaderm. Writer will notify MD of findings  Celesta Aver RN

## 2014-09-05 NOTE — Progress Notes (Signed)
[facility]   Daily Progress Note    Admit Date:  09/02/2014  HPI:    Chief Complaint   Patient presents with   ??? Fall     [facility] by [employer]. was using his walker with wife as stand by and reportedly got weak and fell. per wife his responsiveness has been altered.         Interval history:   Admitted s/p fall with ankle fracture. Hx sCHF with recovered EF, Cirrhosis and MDS on chemo.     Subjective:  Andrew [name] confused and restless. Wife at bedside.    Objective:   Visit Vitals   ??? BP 102/63   ??? Pulse 69   ??? Temp 97.6 ??F (36.4 ??C) (Axillary)   ??? Resp 18   ??? Ht 5\' 5"  (1.651 m)   ??? Wt 170 lb 8 oz (77.3 kg)   ??? SpO2 93%   ??? BMI 28.37 kg/m2       Intake/Output Summary (Last 24 hours) at 09/05/14 1413  Last data filed at 09/05/14 0447   Gross per 24 hour   Intake   1061 ml   Output      0 ml   Net   1061 ml       Physical Exam:  General:  Confused and restless  Skin:  Warm and dry  Neck:  JVD<8  Chest:  Clear to auscultation, no wheezes/rhonchi/rales  Cardiovascular:  RRR S1S2  Abdomen:  Soft, nontender, +bowel sounds  Extremities:  no edema    Medications:   ??? metroNIDAZOLE  500 mg Intravenous Q8H   ??? vancomycin  250 mg Oral 3 times per day   ??? carvedilol  3.125 mg Oral BID   ??? vitamin D  1,000 Units Oral Daily   ??? citalopram  10 mg Oral Daily   ??? ferrous sulfate  325 mg Oral Daily with breakfast   ??? magnesium oxide  400 mg Oral BID   ??? therapeutic multivitamin-minerals  1 tablet Oral Daily   ??? rifaximin  550 mg Oral BID   ??? saccharomyces boulardii  250 mg Oral BID   ??? vitamin B-12  50 mcg Oral Daily     ??? sodium chloride 50 mL/hr at 09/05/14 0420       Lab Data:  CBC:   Recent Labs      09/03/14   0420  09/04/14   0441  09/05/14   0453   WBC  2.3*  2.0*  1.9*   HGB  7.8*  7.8*  7.2*   PLT  28*  30*  29*     BMP:  Recent Labs      09/02/14   1725  09/03/14   0420  09/04/14   0441   NA  132*  136  138   K  4.1  3.5  4.2   CO2  38*  34*  29   BUN  40*  34*  31*   CREATININE   1.2  1.0  0.8     INR:    Recent Labs      09/02/14   1725   INR  1.41*     BNP:  No results for input(s): PROBNP in the last 72 hours.    Active Problems:    Chronic systolic CHF (congestive heart failure) (HCC)    Hypotension    Idiopathic cardiomyopathy (HCC)    Closed displaced fracture of  left talus      Plan:  ~CHF, systolic chronic   -recovered EF   -holding eplerenone and lasix    -tolerating lower dose of Coreg   ~C. Diff    -on abx   -diuretics on hold   -cautious hydration   ~Pancytopenia   -on chemo   -with hx of HF, would prefer to keep Hgb > 8   -transfuse if ok with Hem/Onc

## 2014-09-05 NOTE — Progress Notes (Signed)
Pt extremely restless. Pulling at PIV and attempting to get OOB. PRN haldol given.

## 2014-09-05 NOTE — Progress Notes (Signed)
Dr. Al Corpus paged to notify of critical lab result of 1.9 WBC.

## 2014-09-05 NOTE — Progress Notes (Signed)
Pt found pulling off brief and attempting to get OOB. Brief changed, repositioned in bed. Given warm blanket. Wife at bedside. Bed alarm on, call light in reach. Will continue to monitor.

## 2014-09-05 NOTE — Progress Notes (Signed)
Shift assessment completed , pt in bed appeared restless pulling on kerlex wrap on arm , vss obtained unable to assess pain ,pt eyes remained closed but pt responded to voice. Wife stated no pain. Incontinence care completed , meds given call light in reach of spouse , bed in lowest position ,bed alarm activated  Celesta Aver RN

## 2014-09-05 NOTE — Plan of Care (Signed)
Problem: Falls - Risk of  Goal: Absence of falls  Outcome: Ongoing    Problem: Risk for Impaired Skin Integrity  Goal: Tissue integrity - skin and mucous membranes  Structural intactness and normal physiological function of skin and  mucous membranes.   Outcome: Ongoing

## 2014-09-05 NOTE — Progress Notes (Signed)
Bedside report given to [facility], Therapist, sports. Electronically signed by Dutch Gray, RN on 09/05/2014 at 6:58 AM

## 2014-09-05 NOTE — Telephone Encounter (Signed)
Andrew [name] at [facility] called reporting White Blood Cell Count 1.9 results. ePaged Provider.

## 2014-09-05 NOTE — Progress Notes (Signed)
Pt appeared restless and fidgety, Halidol 2mg  given per Dr, Jessy Oto RN

## 2014-09-05 NOTE — Progress Notes (Addendum)
[facility]   Progress Note      SUBJECTIVE:      Events noted. Diarrhea +. No fever, chills, chest pain, SOB    OBJECTIVE    Physical  VITALS:    Visit Vitals   ??? BP 120/79   ??? Pulse 79   ??? Temp 98.2 ??F (36.8 ??C) (Oral)   ??? Resp 18   ??? Ht 5\' 5"  (1.651 m)   ??? Wt 170 lb 8 oz (77.3 kg)   ??? SpO2 92%   ??? BMI 28.37 kg/m2     TEMPERATURE:  Current - Temp: 98.2 ??F (36.8 ??C); Max - Temp  Avg: 97.9 ??F (36.6 ??C)  Min: 97.6 ??F (36.4 ??C)  Max: 98.2 ??F (36.8 ??C)  BLOOD PRESSURE RANGE:  Systolic (60FUX), NAT:557 , Min:101 , DUK:025   ; Diastolic (42HCW), CBJ:62, Min:63, Max:79    24HR INTAKE/OUTPUT:      Intake/Output Summary (Last 24 hours) at 09/05/14 2254  Last data filed at 09/05/14 0447   Gross per 24 hour   Intake    432 ml   Output      0 ml   Net    432 ml       Drowsy   HEENT: Pallor +   Neck: Supple. No lymphadenopathy  Lungs: CTA. Respiratory efforts normal.  CVS: S1S2 normal. No murmurs or gallops.  Abdomen: Soft BS +. No organomegaly.   Extremities: No edema  Neuro: No focal deficits.  Skin: No Rash Petechiae      Data  Labs:  General Labs:  CBC with Differential:    Lab Results   Component Value Date    WBC 1.9 09/05/2014    WBC 2.6 08/30/2014    RBC 2.25 09/05/2014    RBC 2.96 08/30/2014    HGB 7.2 09/05/2014    HCT 22.2 09/05/2014    PLT 29 09/05/2014    MCV 98.6 09/05/2014    MCH 32.1 09/05/2014    MCHC 32.5 09/05/2014    RDW 21.8 09/05/2014    SEGSPCT 42.6 05/14/2011    BANDSPCT 3 09/02/2014    METASPCT 4 09/02/2014    LYMPHOPCT 42.0 09/05/2014    LYMPHOPCT 41.9 08/30/2014    MONOPCT 0.0 09/05/2014    MYELOPCT 5 09/02/2014    EOSPCT 2.1 05/14/2010    BASOPCT 0.0 09/05/2014    MONOSABS 0.0 09/05/2014    LYMPHSABS 0.8 09/05/2014    EOSABS 0.0 09/05/2014    BASOSABS 0.0 09/05/2014    DIFFTYPE Scan-K 05/14/2011     BMP:    Lab Results   Component Value Date    NA 138 09/04/2014    K 4.2 09/04/2014    CL 105 09/04/2014    CO2 29 09/04/2014    BUN 31 09/04/2014    LABALBU 1.8 09/04/2014    CREATININE 0.8  09/04/2014    CALCIUM 8.0 09/04/2014    GFRAA >60 09/04/2014    GFRAA >60 05/14/2011    LABGLOM >60 09/04/2014    GLUCOSE 113 09/04/2014     Hepatic Function Panel:    Lab Results   Component Value Date    ALKPHOS 110 09/04/2014    ALT 22 09/04/2014    AST 29 09/04/2014    PROT 5.2 09/04/2014    PROT 6.0 05/14/2011    BILITOT 1.4 09/04/2014    BILIDIR 0.6 09/02/2014    IBILI 1.9 09/02/2014    LABALBU 1.8 09/04/2014     LDH:  No results found for: LDH  PT/INR:    Lab Results   Component Value Date    PROTIME 16.2 09/02/2014    INR 1.41 09/02/2014    INR 1.17 05/14/2010     PTT:    Lab Results   Component Value Date    APTT 30.7 09/02/2014    APTT 29.7 10/17/2009   [APTT    Current Medications  Current Facility-Administered Medications: haloperidol lactate (HALDOL) injection 2 mg, 2 mg, Intramuscular, Q4H PRN  metronidazole (FLAGYL) 500 mg in NaCl 100 mL IVPB premix, 500 mg, Intravenous, Q8H  vancomycin (VANCOCIN) oral solution 250 mg, 250 mg, Oral, 3 times per day  carvedilol (COREG) tablet 3.125 mg, 3.125 mg, Oral, BID  vitamin D (CHOLECALCIFEROL) tablet 1,000 Units, 1,000 Units, Oral, Daily  citalopram (CELEXA) tablet 10 mg, 10 mg, Oral, Daily  ferrous sulfate tablet 325 mg, 325 mg, Oral, Daily with breakfast  magnesium oxide (MAG-OX) tablet 400 mg, 400 mg, Oral, BID  therapeutic multivitamin-minerals 1 tablet, 1 tablet, Oral, Daily  promethazine (PHENERGAN) tablet 12.5 mg, 12.5 mg, Oral, Q6H PRN  rifaximin (XIFAXAN) tablet 550 mg, 550 mg, Oral, BID  saccharomyces boulardii (FLORASTOR) capsule 250 mg, 250 mg, Oral, BID  traMADol (ULTRAM) tablet 50 mg, 50 mg, Oral, Q12H PRN  vitamin B-12 (CYANOCOBALAMIN) tablet 50 mcg, 50 mcg, Oral, Daily  0.9 % sodium chloride infusion, , Intravenous, Continuous    ASSESSMENT AND PLAN    1. 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 Monitor counts. Transfuse if Hb < 7 or if Platelet count is < 15    2. C Diff colitis: Flagyl + Vanco.    3. H/O Cirrhosis    Mare Loan, MD

## 2014-09-05 NOTE — Progress Notes (Signed)
PRN haldol given for agitation. Pt reaching for PIV and very restless in bed.

## 2014-09-05 NOTE — Progress Notes (Signed)
Pt pulled out PIV. New PIV placed in L FA, wrapped in kerlex. New bag of IVF hung and tubing changed. Full bath and linen change. PRN ultram given for pain. Bed alarm on, call light in reach.

## 2014-09-06 LAB — CBC
Hematocrit: 21.3 % — ABNORMAL LOW (ref 40.5–52.5)
Hemoglobin: 7.1 g/dL — ABNORMAL LOW (ref 13.5–17.5)
MCH: 32.2 pg (ref 26.0–34.0)
MCHC: 33.2 g/dL (ref 31.0–36.0)
MCV: 96.9 fL (ref 80.0–100.0)
MPV: 9 fL (ref 5.0–10.5)
Platelets: 32 10*3/uL — ABNORMAL LOW (ref 135–450)
RBC: 2.19 M/uL — ABNORMAL LOW (ref 4.20–5.90)
RDW: 20.7 % — ABNORMAL HIGH (ref 12.4–15.4)
WBC: 1.7 10*3/uL — CL (ref 4.0–11.0)

## 2014-09-06 LAB — COMPREHENSIVE METABOLIC PANEL
ALT: 25 U/L (ref 10–40)
AST: 40 U/L — ABNORMAL HIGH (ref 15–37)
Albumin/Globulin Ratio: 0.6 — ABNORMAL LOW (ref 1.1–2.2)
Albumin: 2 g/dL — ABNORMAL LOW (ref 3.4–5.0)
Alkaline Phosphatase: 104 U/L (ref 40–129)
Anion Gap: 5 (ref 3–16)
BUN: 23 mg/dL — ABNORMAL HIGH (ref 7–20)
CO2: 30 mmol/L (ref 21–32)
Calcium: 8.2 mg/dL — ABNORMAL LOW (ref 8.3–10.6)
Chloride: 111 mmol/L — ABNORMAL HIGH (ref 99–110)
Creatinine: 0.7 mg/dL — ABNORMAL LOW (ref 0.8–1.3)
GFR African American: >60 (ref >60)
GFR Non-African American: >60 (ref >60)
Globulin: 3.4 g/dL
Glucose: 107 mg/dL — ABNORMAL HIGH (ref 70–99)
Potassium: 4.1 mmol/L (ref 3.5–5.1)
Sodium: 146 mmol/L — ABNORMAL HIGH (ref 136–145)
Total Bilirubin: 2.7 mg/dL — ABNORMAL HIGH (ref 0.0–1.0)
Total Protein: 5.4 g/dL — ABNORMAL LOW (ref 6.4–8.2)

## 2014-09-06 LAB — TYPE AND SCREEN
ABO/Rh: A NEG
Antibody Screen: NEGATIVE

## 2014-09-06 MED ORDER — DARBEPOETIN ALFA 200 MCG/ML IJ SOLN
200 MCG/ML | Freq: Once | INTRAMUSCULAR | Status: AC
Start: 2014-09-06 — End: 2014-09-06
  Administered 2014-09-06: 16:00:00 200 ug via SUBCUTANEOUS

## 2014-09-06 MED ORDER — SODIUM CHLORIDE 0.9 % IV BOLUS
0.9 % | Freq: Once | INTRAVENOUS | Status: AC
Start: 2014-09-06 — End: 2014-09-07
  Administered 2014-09-06: 18:00:00 250 mL via INTRAVENOUS

## 2014-09-06 MED ORDER — DARBEPOETIN ALFA 100 MCG/ML IJ SOLN
100 MCG/ML | Freq: Once | INTRAMUSCULAR | Status: AC
Start: 2014-09-06 — End: 2014-09-06
  Administered 2014-09-06: 16:00:00 100 ug via SUBCUTANEOUS

## 2014-09-06 MED ORDER — DARBEPOETIN ALFA 300 MCG/ML IJ SOLN
300 MCG/ML | Freq: Once | INTRAMUSCULAR | Status: DC
Start: 2014-09-06 — End: 2014-09-06

## 2014-09-06 MED FILL — METRONIDAZOLE IN NACL 5-0.79 MG/ML-% IV SOLN: 5 mg/mL | INTRAVENOUS | Qty: 100

## 2014-09-06 MED FILL — FERROUS SULFATE 325 (65 FE) MG PO TABS: 325 (65 Fe) MG | ORAL | Qty: 1

## 2014-09-06 MED FILL — SODIUM CHLORIDE 0.9 % IV SOLN: 0.9 % | INTRAVENOUS | Qty: 250

## 2014-09-06 MED FILL — HALOPERIDOL LACTATE 5 MG/ML IJ SOLN: 5 MG/ML | INTRAMUSCULAR | Qty: 1

## 2014-09-06 MED FILL — CARVEDILOL 3.125 MG PO TABS: 3.125 MG | ORAL | Qty: 1

## 2014-09-06 MED FILL — VITAMIN B-12 100 MCG PO TABS: 100 MCG | ORAL | Qty: 1

## 2014-09-06 MED FILL — ARANESP (ALBUMIN FREE) 300 MCG/ML IJ SOLN: 300 MCG/ML | INTRAMUSCULAR | Qty: 1

## 2014-09-06 MED FILL — XIFAXAN 550 MG PO TABS: 550 MG | ORAL | Qty: 1

## 2014-09-06 MED FILL — SODIUM CHLORIDE 0.9 % IV SOLN: 0.9 % | INTRAVENOUS | Qty: 1000

## 2014-09-06 MED FILL — MAGNESIUM OXIDE 400 (241.3 MG) MG PO TABS: 400 (241.3 Mg) MG | ORAL | Qty: 1

## 2014-09-06 MED FILL — VITAMIN D3 25 MCG (1000 UT) PO TABS: 25 MCG (1000 UT) | ORAL | Qty: 1

## 2014-09-06 MED FILL — ARANESP (ALBUMIN FREE) 200 MCG/ML IJ SOLN: 200 MCG/ML | INTRAMUSCULAR | Qty: 1

## 2014-09-06 MED FILL — ARANESP (ALBUMIN FREE) 100 MCG/ML IJ SOLN: 100 MCG/ML | INTRAMUSCULAR | Qty: 1

## 2014-09-06 MED FILL — FLORASTOR 250 MG PO CAPS: 250 MG | ORAL | Qty: 1

## 2014-09-06 MED FILL — CITALOPRAM HYDROBROMIDE 20 MG PO TABS: 20 MG | ORAL | Qty: 1

## 2014-09-06 MED FILL — THERAPEUTIC MULTIVIT/MINERAL PO TABS: ORAL | Qty: 1

## 2014-09-06 MED FILL — VANCOMYCIN 50 MG/ML ORAL SOLUTION: 50 mg/mL | ORAL | Qty: 5

## 2014-09-06 NOTE — Care Coordination-Inpatient (Signed)
Discharge Planning:  Chart reviewed. Patient was admitted from [facility], where he had been participating in skilled care, but was cut from [facility]. Patient was paying privately as intermediate level of care at the time of admission to the hospital. Patient would require a precert with [facility] for skilled care again. SW notified patient's nurse, Charlene, of the need for orders for therapy evaluations. Patient will continue to be followed through discharge.     [place] A. Gilford Rile, MSW,LSW

## 2014-09-06 NOTE — Progress Notes (Signed)
Pt resting quietly in bed. Respirations even and unlabored. Bed alarm on, call light in reach. Will continue to monitor.

## 2014-09-06 NOTE — Progress Notes (Signed)
Patient in bed with eyes closed. Patient incontinent of urine and small amount of stool. Peri care given. Assessment complete. Patient oriented to self only. Left foot dressing D&I. Wife present. Call light in reach.

## 2014-09-06 NOTE — Progress Notes (Signed)
Nutrition Assessment    Type and Reason for Visit: Reassess    Nutrition Recommendations:   1. Trial Ensure Clear (berry) or Ensure Enlive (any flavor) TID  2. Allow pt to order additional supplements PRN between meals   3. Encourage PO intake  4. Assist pt with meal ordering and PO intake   5. Document all PO intake in flow sheets     Malnutrition Assessment:  ?? Malnutrition Status: At risk for malnutrition  ?? Findings of the 6 clinical characteristics of malnutrition (Minimum of 2 out of 6 clinical characteristics is required to make the diagnosis of moderate or severe Protein Calorie Malnutrition based on AND/ASPEN Guidelines):  1. Energy Intake-Not available, not able to assess    2. Weight Loss-10% loss or greater,  (4 months)  3. Fat Loss-Unable to assess,    4. Muscle Loss-Unable to assess,    5. Fluid Accumulation-No significant fluid accumulation,    6. Grip Strength-Not measured    Nutrition Diagnosis:   ?? Problem: Unintended weight loss  ?? Etiology: related to Insufficient energy/nutrient consumption    ??? Signs and symptoms:  as evidenced by  (Weight loss of 14% in 4 months)    Nutrition Assessment:  ?? Subjective Assessment: Confusion continues; pt unable to provide hx to RD at this time.Pt continues with poor/fair PO intake. Pt's wife at bedside reports pt consumed ~ 6 bites of eggs and over 50% of a yogurt this am. Pt's wife originally declined ONS but was open to it today. Pt has been drinking well, wife is agreeable to either Ensure Enlive or Ensure Clear TID. RD encouraged pt to consume as much of meal as possible and drink supplements between meals. Pt may order additional supplements between meals PRN.    ?? Current Nutrition Therapies:  ?? Oral Diet Orders: General, No Added Salt (3-4gm)   ?? Oral Diet intake: 1-25%, 26-50%  ?? Oral Nutrition Supplement (ONS) Orders: None  ?? ONS intake: Unable to assess  ?? Anthropometric Measures:  ?? Ht: 5\' 5"  (165.1 cm)   ?? Current Body Wt: 170 lb (77.1  kg)  ?? Usual Body Wt: 188 lb (85.3 kg) (per wife)  ?? Ideal Body Wt: 136 lb (61.7 kg)   ?? BMI Classification: BMI 25.0 - 29.9 Overweight    Estimated Intake vs Estimated Needs: Insufficient Data    Nutrition Risk Level: Moderate, High    Nutrition Interventions:   Continue current diet, Start ONS  Continued Inpatient Monitoring, Feeding Assistance/Environment Change, Education Not Indicated    Nutrition Evaluation:   ?? Evaluation: Progressing toward goals   ?? Goals: Pt will consume >50% of all meal trays.    ?? Monitoring: Meals Intake, Supplement Intake, Tolerance to Diet, Weight Status, Mental Status/Confusion, Comparative Standards    See Adult Nutrition Doc Flowsheet for more detail.     Electronically signed by Greer Ee, RD, LD on 09/06/14 at 2:50 PM    Contact Number: [phone]

## 2014-09-06 NOTE — Progress Notes (Addendum)
[place]   Daily Progress Note    Admit Date:  09/02/2014  HPI:    Chief Complaint   Patient presents with   ??? Fall     [facility] by [employer]. was using his walker with wife as stand by and reportedly got weak and fell. per wife his responsiveness has been altered.         Interval history:   Admitted s/p fall with ankle fracture. Hx sCHF with recovered EF, Cirrhosis and MDS on chemo.     Subjective:  Mr. Ruane remains confused and restless. Currently receiving 1 unit PRBC.    Objective:   Visit Vitals   ??? BP 114/72   ??? Pulse 79   ??? Temp 97.7 ??F (36.5 ??C) (Axillary)   ??? Resp 20   ??? Ht 5\' 5"  (1.651 m)   ??? Wt 170 lb 8 oz (77.3 kg)   ??? SpO2 90%   ??? BMI 28.37 kg/m2         Intake/Output Summary (Last 24 hours) at 09/06/14 1521  Last data filed at 09/06/14 1420   Gross per 24 hour   Intake   1272 ml   Output      0 ml   Net   1272 ml       Physical Exam:  General:  Confused and restless  Skin:  Warm and dry  Neck:  JVD<8  Chest:  Clear to auscultation, no wheezes/rhonchi/rales  Cardiovascular:  RRR S1S2  Abdomen:  Soft, nontender, +bowel sounds  Extremities:  no edema    Medications:   ??? sodium chloride  250 mL Intravenous Once   ??? metroNIDAZOLE  500 mg Intravenous Q8H   ??? vancomycin  250 mg Oral 3 times per day   ??? carvedilol  3.125 mg Oral BID   ??? vitamin D  1,000 Units Oral Daily   ??? citalopram  10 mg Oral Daily   ??? ferrous sulfate  325 mg Oral Daily with breakfast   ??? magnesium oxide  400 mg Oral BID   ??? therapeutic multivitamin-minerals  1 tablet Oral Daily   ??? rifaximin  550 mg Oral BID   ??? saccharomyces boulardii  250 mg Oral BID   ??? vitamin B-12  50 mcg Oral Daily     ??? sodium chloride 50 mL/hr at 09/05/14 2228       Lab Data:  CBC:   Recent Labs      09/04/14   0441  09/05/14   0453  09/05/14   0553  09/06/14   0534   WBC  2.0*  1.9*  1.9*  1.7*   HGB  7.8*  7.2*   --   7.1*   PLT  30*  29*   --   32*     BMP:    Recent Labs      09/04/14   0441  09/06/14   0534   NA   138  146*   K  4.2  4.1   CO2  29  30   BUN  31*  23*   CREATININE  0.8  0.7*     INR:    No results for input(s): INR in the last 72 hours.  BNP:  No results for input(s): PROBNP in the last 72 hours.    Active Problems:    Chronic systolic CHF (congestive heart failure) (HCC)    Hypotension    Idiopathic cardiomyopathy (  [place])    Closed displaced fracture of left talus      Plan:  ~CHF, systolic chronic   -recovered EF   -holding eplerenone and lasix    -tolerating lower dose of Coreg   ~C. Diff    -on abx   -diuretics on hold   -agree with hydration   ~Pancytopenia   -on chemo   -with hx of HF, would prefer to keep Hgb > 8   -receiving 1 unit PRBC today    No additional cardiac recommendations. Will follow peripherally. Please call with questions or concerns.     Bernadette Hoit, [place]

## 2014-09-06 NOTE — Progress Notes (Signed)
Very restless. Pulling at PIV and blanket. Ace wrapped around PIV. Repositioned in bed. Brief changed. Bed alarm on, call light in reach. Wife at bedside.

## 2014-09-06 NOTE — Progress Notes (Signed)
[facility]   Progress Note      09/06/2014 8:26 AM        Name: Andrew [name] .              Admitted: 09/02/2014    SUBJECTIVE:  Pt is confused.  Appears comfortable.  Wife at the bedside.  Reports that the diarrhea is improving.  Confusion still much worse than normal.    Reviewed interval ancillary notes    Current Medications    0.9 % sodium chloride bolus Once   darbepoetin alfa-polysorbate SOLN 300 mcg Once   haloperidol lactate (HALDOL) injection 2 mg Q4H PRN   metronidazole (FLAGYL) 500 mg in NaCl 100 mL IVPB premix Q8H   vancomycin (VANCOCIN) oral solution 250 mg 3 times per day   carvedilol (COREG) tablet 3.125 mg BID   vitamin D (CHOLECALCIFEROL) tablet 1,000 Units Daily   citalopram (CELEXA) tablet 10 mg Daily   ferrous sulfate tablet 325 mg Daily with breakfast   magnesium oxide (MAG-OX) tablet 400 mg BID   therapeutic multivitamin-minerals 1 tablet Daily   promethazine (PHENERGAN) tablet 12.5 mg Q6H PRN   rifaximin (XIFAXAN) tablet 550 mg BID   saccharomyces boulardii (FLORASTOR) capsule 250 mg BID   traMADol (ULTRAM) tablet 50 mg Q12H PRN   vitamin B-12 (CYANOCOBALAMIN) tablet 50 mcg Daily   0.9 % sodium chloride infusion Continuous       Objective:  Visit Vitals   ??? BP 114/81   ??? Pulse 82   ??? Temp 98.1 ??F (36.7 ??C) (Axillary)   ??? Resp 18   ??? Ht 5\' 5"  (1.651 m)   ??? Wt 170 lb 8 oz (77.3 kg)   ??? SpO2 90%   ??? BMI 28.37 kg/m2     No intake or output data in the 24 hours ending 09/06/14 0826 Wt Readings from Last 3 Encounters:   09/05/14 170 lb 8 oz (77.3 kg)   08/30/14 167 lb 9.6 oz (76 kg)   08/29/14 169 lb (76.7 kg)       General appearance:  Appears comfortable  Eyes: Sclera clear. Pupils equal.  ENT: Moist oral mucosa. Trachea midline, no adenopathy.  Cardiovascular: Regular rhythm, No edema in lower extremities  Respiratory: Not using accessory muscles. Good inspiratory effort.   GI: Abdomen soft, no tenderness, not distended, normal bowel sounds  Musculoskeletal: No cyanosis in  digits, neck supple  Neurology: CN 2-12 grossly intact.  Psych: confused  Skin: Warm, dry, normal turgor    Labs and Tests:  CBC:   Recent Labs      09/04/14   0441  09/05/14   0453  09/05/14   0553  09/06/14   0534   WBC  2.0*  1.9*  1.9*  1.7*   HGB  7.8*  7.2*   --   7.1*   PLT  30*  29*   --   32*     BMP:  Recent Labs      09/04/14   0441  09/06/14   0534   NA  138  146*   K  4.2  4.1   CL  105  111*   CO2  29  30   BUN  31*  23*   CREATININE  0.8  0.7*   GLUCOSE  113*  107*     Hepatic: Recent Labs      09/04/14   0441  09/06/14   0534   AST  29  40*   ALT  22  25   BILITOT  1.4*  2.7*   ALKPHOS  110  104       ASSESSMENT AND PLAN    Active Problems:    Chronic systolic CHF (congestive heart failure) (HCC)    Hypotension    Idiopathic cardiomyopathy (HCC)    Closed displaced fracture of left talus      1. MDS: On Vidaza C1D101. Monitor counts. Transfuse 1 unit prbc today.  We were trying to get procrit approved at the [facility] as an outpatient but he has not received this yet.  Will give Aranesp 318mcg today.  ??  2. C Diff colitis: Flagyl + Vanco.  ??  3. Cirrhosis    4. Metabolic encephalopathy - 2/2 infection.  Has underlying dementia.    5. CHF - compensated.  Cardiology managing.    Discussed with pt's wife.    [name], [facility]  [facility]  Office: [phone]  Cell: [phone]

## 2014-09-06 NOTE — Progress Notes (Signed)
Bedside report completed with [name], RN. Electronically signed by Dutch Gray, RN on 09/06/2014 at 7:07 AM

## 2014-09-06 NOTE — Progress Notes (Signed)
Patient quiet with eyes closed. Respirations regular and unlabored. Will monitor.

## 2014-09-06 NOTE — Progress Notes (Signed)
Shift assessment completed and medications given crushed in applesauce. PRN haldol given per family request due to agitation. VSS. Call light is within reach and bed alarm on. Family at the bedside. Will continue to monitor.

## 2014-09-06 NOTE — Progress Notes (Signed)
Patient awake in bed, not as agitated. Positioned for comfort. Wife present. Bed alarm on.

## 2014-09-06 NOTE — Progress Notes (Signed)
Unit PRBC's completely infused. VSS. No reaction noted. Patient becoming more agitated. Medicated with Haldol 2mg  IM.

## 2014-09-06 NOTE — Plan of Care (Signed)
Problem: Falls - Risk of  Goal: Absence of falls  Outcome: Ongoing    Problem: Risk for Impaired Skin Integrity  Goal: Tissue integrity - skin and mucous membranes  Structural intactness and normal physiological function of skin and  mucous membranes.   Outcome: Ongoing

## 2014-09-06 NOTE — Progress Notes (Signed)
PIV red and inflamed. Removed from [place]. New PIV placed in wrist. Wrapped with ace wrap. Repositioned in bed. Bed alarm on, call light in reach.

## 2014-09-06 NOTE — Progress Notes (Signed)
Patient agitated. Medicated with Haldol 2mg  per LDM. Will monitor.

## 2014-09-06 NOTE — Progress Notes (Signed)
Unit of PRBC's starting to infuse. VSS. Will monitor.

## 2014-09-06 NOTE — Progress Notes (Signed)
PRN tramadol for 10/10 pain given. Pt is agitated and restless. Electronically signed by Regis Bill, RN on 09/06/2014 at 10:36 PM

## 2014-09-06 NOTE — Progress Notes (Signed)
Pt is restless in bed. Ace wrap applied over PIV to prevent pt from pulling. Warm blanket given. Repositioned in bed. Bed alarm on, call light in reach. Wife at bedside. Will continue to monitor.

## 2014-09-06 NOTE — Progress Notes (Signed)
Department of Internal Medicine  General Internal Medicine   Progress Note      SUBJECTIVE: still has intermittent agitation but responds well to parenteral haldol    History obtained from chart review and the patient  General ROS: positive for  - fatigue and malaise  negative for - chills, fever or night sweats  Psychological ROS: negative  Respiratory ROS: no cough, shortness of breath, or wheezing  Cardiovascular ROS: no chest pain or dyspnea on exertion  Gastrointestinal ROS: no abdominal pain, change in bowel habits, or black or bloody stools  Genito-Urinary ROS: no dysuria, trouble voiding, or hematuria  Musculoskeletal ROS: positive for - foot pain   negative for - joint swelling    OBJECTIVE      Medications      Current Facility-Administered Medications: 0.9 % sodium chloride bolus, 250 mL, Intravenous, Once  acetaminophen (TYLENOL) tablet 650 mg, 650 mg, Oral, Once  haloperidol lactate (HALDOL) injection 2 mg, 2 mg, Intramuscular, Q4H PRN  metronidazole (FLAGYL) 500 mg in NaCl 100 mL IVPB premix, 500 mg, Intravenous, Q8H  vancomycin (VANCOCIN) oral solution 250 mg, 250 mg, Oral, 3 times per day  carvedilol (COREG) tablet 3.125 mg, 3.125 mg, Oral, BID  vitamin D (CHOLECALCIFEROL) tablet 1,000 Units, 1,000 Units, Oral, Daily  citalopram (CELEXA) tablet 10 mg, 10 mg, Oral, Daily  ferrous sulfate tablet 325 mg, 325 mg, Oral, Daily with breakfast  magnesium oxide (MAG-OX) tablet 400 mg, 400 mg, Oral, BID  therapeutic multivitamin-minerals 1 tablet, 1 tablet, Oral, Daily  promethazine (PHENERGAN) tablet 12.5 mg, 12.5 mg, Oral, Q6H PRN  rifaximin (XIFAXAN) tablet 550 mg, 550 mg, Oral, BID  saccharomyces boulardii (FLORASTOR) capsule 250 mg, 250 mg, Oral, BID  traMADol (ULTRAM) tablet 50 mg, 50 mg, Oral, Q12H PRN  vitamin B-12 (CYANOCOBALAMIN) tablet 50 mcg, 50 mcg, Oral, Daily  0.9 % sodium chloride infusion, , Intravenous, Continuous    Physical      VITALS:    Visit Vitals   ??? BP 115/73   ??? Pulse 82   ??? Temp  98.4 ??F (36.9 ??C) (Axillary)   ??? Resp 18   ??? Ht 5\' 5"  (1.651 m)   ??? Wt 170 lb 8 oz (77.3 kg)   ??? SpO2 91%   ??? BMI 28.37 kg/m2     TEMPERATURE:  Current - Temp: 98.4 ??F (36.9 ??C); Max - Temp  Avg: 97.9 ??F (36.6 ??C)  Min: 97.5 ??F (36.4 ??C)  Max: 98.4 ??F (36.9 ??C)  RESPIRATIONS RANGE: Resp  Avg: 18  Min: 16  Max: 20  PULSE RANGE: Pulse  Avg: 78.6  Min: 72  Max: 87  BLOOD PRESSURE RANGE:  Systolic (57QIO), NGE:952 , Min:93 , WUX:324   ; Diastolic (40NUU), VOZ:36, Min:48, Max:74    PULSE OXIMETRY RANGE: SpO2  Avg: 91.5 %  Min: 90 %  Max: 94 %  24HR INTAKE/OUTPUT:      Intake/Output Summary (Last 24 hours) at 09/07/14 1310  Last data filed at 09/06/14 1715   Gross per 24 hour   Intake   1612 ml   Output      0 ml   Net   1612 ml     CONSTITUTIONAL:  fatigued, alert, uncooperative, distracted, mild distress and appears stated age  NECK:  Mild JVD   BACK:  wnl  LUNGS:  No increased work of breathing, good air exchange, clear to auscultation bilaterally, no crackles or wheezing  CARDIOVASCULAR:  Normal apical impulse, regular rate and  rhythm, normal S1 and S2, no S3 or S4, and no murmur noted  ABDOMEN:  Soft non tender BS +  MUSCULOSKELETAL:  Tenderness left foot  NEUROLOGIC:  No acute focal     Data      No results found for: PHART, PO2ART, PCO2ART, HCO3ART, BEART, O2SATART    Lab Results   Component Value Date    NA 146 09/06/2014    K 4.1 09/06/2014    CL 111 09/06/2014    CO2 30 09/06/2014    BUN 23 09/06/2014    CREATININE 0.7 09/06/2014    GLUCOSE 107 09/06/2014    CALCIUM 8.2 09/06/2014     Lab Results   Component Value Date    WBC 2.0 09/07/2014    WBC 2.6 08/30/2014    HGB 7.9 09/07/2014    HCT 23.9 09/07/2014    MCV 96.6 09/07/2014    PLT 31 09/07/2014         Lab Results   Component Value Date    INR 1.41 (H) 09/02/2014    PROTIME 16.2 (H) 09/02/2014       ASSESSMENT AND PLAN      Patient Active Problem List   Diagnosis   ??? Erectile dysfunction   ??? Thrombocytopenia ([place])   ??? Edema   ??? BPH (benign prostatic  hyperplasia)   ??? Alpha-1-antitrypsin deficiency   ??? Cirrhosis, nonalcoholic ([place])   ??? Mild CAD   ??? Chronic systolic CHF (congestive heart failure) ([place])   ??? Non-ischemic cardiomyopathy ([place])   ??? Lumbar compression fracture ([place])   ??? Chronic low back pain   ??? Osteopenia   ??? Coagulopathy ([place])   ??? Hypotension   ??? Abnormal EKG   ??? Visual changes   ??? Fixed pupil of right eye   ??? Anemia   ??? Hemispheric carotid artery syndrome   ??? CAD in native artery   ??? Hyperlipidemia   ??? 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 Bronchiectasis without complication ([place])   ??? Benign essential HTN   ??? Hepatic encephalopathy (HCC)   ??? Ataxia   ??? Pancytopenia ([place])   ??? DVT (deep venous thrombosis) ([place])   ??? 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 Pneumonia   ??? Traumatic hematoma of buttock   ??? Pneumonia of both lungs due to infectious organism   ??? Hematoma   ??? Closed displaced fracture of left talus     Ct present medical management , 1 unit of PRBC ordered by heme onc , discussed with nursing staff

## 2014-09-06 NOTE — Progress Notes (Signed)
Patient incontinent of urine. Peri care given and brief placed. Positioned for comfort. Bed alarm on.

## 2014-09-07 LAB — CBC
Hematocrit: 23.9 % — ABNORMAL LOW (ref 40.5–52.5)
Hemoglobin: 7.9 g/dL — ABNORMAL LOW (ref 13.5–17.5)
MCH: 32.1 pg (ref 26.0–34.0)
MCHC: 33.3 g/dL (ref 31.0–36.0)
MCV: 96.6 fL (ref 80.0–100.0)
MPV: 8.7 fL (ref 5.0–10.5)
Platelets: 31 10*3/uL — ABNORMAL LOW (ref 135–450)
RBC: 2.47 M/uL — ABNORMAL LOW (ref 4.20–5.90)
RDW: 17.8 % — ABNORMAL HIGH (ref 12.4–15.4)
WBC: 2 10*3/uL — ABNORMAL LOW (ref 4.0–11.0)

## 2014-09-07 LAB — CULTURE, BLOOD 2: Culture, Blood 2: NO GROWTH

## 2014-09-07 LAB — CULTURE BLOOD #1: Blood Culture, Routine: NO GROWTH

## 2014-09-07 LAB — PREPARE RBC (CROSSMATCH)

## 2014-09-07 MED ORDER — ACETAMINOPHEN 325 MG PO TABS
325 MG | Freq: Once | ORAL | Status: AC
Start: 2014-09-07 — End: 2014-09-07
  Administered 2014-09-07: 19:00:00 650 mg via ORAL

## 2014-09-07 MED ORDER — SODIUM CHLORIDE 0.9 % IV BOLUS
0.9 % | Freq: Once | INTRAVENOUS | Status: AC
Start: 2014-09-07 — End: 2014-09-08
  Administered 2014-09-07: 19:00:00 250 mL via INTRAVENOUS

## 2014-09-07 MED FILL — VANCOMYCIN 50 MG/ML ORAL SOLUTION: 50 mg/mL | ORAL | Qty: 5

## 2014-09-07 MED FILL — SODIUM CHLORIDE 0.9 % IV SOLN: 0.9 % | INTRAVENOUS | Qty: 1000

## 2014-09-07 MED FILL — HALOPERIDOL LACTATE 5 MG/ML IJ SOLN: 5 MG/ML | INTRAMUSCULAR | Qty: 1

## 2014-09-07 MED FILL — VITAMIN B-12 100 MCG PO TABS: 100 MCG | ORAL | Qty: 1

## 2014-09-07 MED FILL — THERAPEUTIC MULTIVIT/MINERAL PO TABS: ORAL | Qty: 1

## 2014-09-07 MED FILL — TRAMADOL HCL 50 MG PO TABS: 50 MG | ORAL | Qty: 1

## 2014-09-07 MED FILL — VITAMIN D3 25 MCG (1000 UT) PO TABS: 25 MCG (1000 UT) | ORAL | Qty: 1

## 2014-09-07 MED FILL — CARVEDILOL 3.125 MG PO TABS: 3.125 MG | ORAL | Qty: 1

## 2014-09-07 MED FILL — FLORASTOR 250 MG PO CAPS: 250 MG | ORAL | Qty: 1

## 2014-09-07 MED FILL — XIFAXAN 550 MG PO TABS: 550 MG | ORAL | Qty: 1

## 2014-09-07 MED FILL — ACETAMINOPHEN 325 MG PO TABS: 325 MG | ORAL | Qty: 2

## 2014-09-07 MED FILL — MAGNESIUM OXIDE 400 (241.3 MG) MG PO TABS: 400 (241.3 Mg) MG | ORAL | Qty: 1

## 2014-09-07 MED FILL — CITALOPRAM HYDROBROMIDE 20 MG PO TABS: 20 MG | ORAL | Qty: 1

## 2014-09-07 MED FILL — METRONIDAZOLE IN NACL 5-0.79 MG/ML-% IV SOLN: 5 mg/mL | INTRAVENOUS | Qty: 100

## 2014-09-07 MED FILL — FERROUS SULFATE 325 (65 FE) MG PO TABS: 325 (65 Fe) MG | ORAL | Qty: 1

## 2014-09-07 MED FILL — SODIUM CHLORIDE 0.9 % IV SOLN: 0.9 % | INTRAVENOUS | Qty: 250

## 2014-09-07 NOTE — Progress Notes (Signed)
Called blood bank to release blood.

## 2014-09-07 NOTE — Progress Notes (Signed)
PRN haldol given at this time per family request. Will continue to monitor.

## 2014-09-07 NOTE — Progress Notes (Signed)
VSS at this time. PRN haldol given per family request. Incontinence brief changed and peri care provided. Call light within reach and bed alarm on. Daughter at the bedside. Will continue to monitor.

## 2014-09-07 NOTE — Progress Notes (Signed)
Bedside handoff received from and completed with Lauren, RN.

## 2014-09-07 NOTE — Progress Notes (Signed)
After 20 minutes of the blood transfusing, vital signs are stable.

## 2014-09-07 NOTE — Progress Notes (Signed)
Bedside handoff given to and completed with Lauren, RN.

## 2014-09-07 NOTE — Progress Notes (Signed)
Department of Internal Medicine  General Internal Medicine   Progress Note      SUBJECTIVE:  Diarrhea less frequent  But persists , quite fatigued     History obtained from chart review and the patient  General ROS: positive for  - fatigue and malaise  negative for - chills, fever or night sweats  Psychological ROS: negative  Respiratory ROS: no cough, shortness of breath, or wheezing  Cardiovascular ROS: no chest pain or dyspnea on exertion  Gastrointestinal ROS: no abdominal pain, change in bowel habits, or black or bloody stools  Genito-Urinary ROS: no dysuria, trouble voiding, or hematuria  Musculoskeletal ROS: positive for - foot pain   negative for - joint swelling    OBJECTIVE      Medications      Current Facility-Administered Medications: sodium chloride flush 0.9 % injection, , ,   lidocaine PF 1 % (PF) injection 5 mL, 5 mL, Intradermal, Once  sodium chloride flush 0.9 % injection 10 mL, 10 mL, Intravenous, 2 times per day  haloperidol lactate (HALDOL) injection 2 mg, 2 mg, Intramuscular, Q4H PRN  metronidazole (FLAGYL) 500 mg in NaCl 100 mL IVPB premix, 500 mg, Intravenous, Q8H  vancomycin (VANCOCIN) oral solution 250 mg, 250 mg, Oral, 3 times per day  carvedilol (COREG) tablet 3.125 mg, 3.125 mg, Oral, BID  vitamin D (CHOLECALCIFEROL) tablet 1,000 Units, 1,000 Units, Oral, Daily  citalopram (CELEXA) tablet 10 mg, 10 mg, Oral, Daily  ferrous sulfate tablet 325 mg, 325 mg, Oral, Daily with breakfast  magnesium oxide (MAG-OX) tablet 400 mg, 400 mg, Oral, BID  therapeutic multivitamin-minerals 1 tablet, 1 tablet, Oral, Daily  promethazine (PHENERGAN) tablet 12.5 mg, 12.5 mg, Oral, Q6H PRN  rifaximin (XIFAXAN) tablet 550 mg, 550 mg, Oral, BID  saccharomyces boulardii (FLORASTOR) capsule 250 mg, 250 mg, Oral, BID  traMADol (ULTRAM) tablet 50 mg, 50 mg, Oral, Q12H PRN  vitamin B-12 (CYANOCOBALAMIN) tablet 50 mcg, 50 mcg, Oral, Daily  0.9 % sodium chloride infusion, , Intravenous, Continuous    Physical       VITALS:    Visit Vitals   ??? BP 103/63   ??? Pulse 83   ??? Temp 97.4 ??F (36.3 ??C) (Axillary)   ??? Resp 18   ??? Ht 5\' 5"  (1.651 m)   ??? Wt 170 lb 8 oz (77.3 kg)   ??? SpO2 93%   ??? BMI 28.37 kg/m2     TEMPERATURE:  Current - Temp: 97.4 ??F (36.3 ??C); Max - Temp  Avg: 97.6 ??F (36.4 ??C)  Min: 97.3 ??F (36.3 ??C)  Max: 97.9 ??F (36.6 ??C)  RESPIRATIONS RANGE: Resp  Avg: 18  Min: 18  Max: 18  PULSE RANGE: Pulse  Avg: 78  Min: 72  Max: 87  BLOOD PRESSURE RANGE:  Systolic (13YQM), VHQ:469 , Min:99 , GEX:528   ; Diastolic (41LKG), MWN:02, Min:63, Max:81    PULSE OXIMETRY RANGE: SpO2  Avg: 96.1 %  Min: 93 %  Max: 98 %  24HR INTAKE/OUTPUT:    No intake or output data in the 24 hours ending 09/08/14 1037  CONSTITUTIONAL:  fatigued, alert, uncooperative, distracted, mild distress and appears stated age  NECK:  Mild JVD   BACK:  wnl  LUNGS:  No increased work of breathing, good air exchange, clear to auscultation bilaterally, no crackles or wheezing  CARDIOVASCULAR:  Normal apical impulse, regular rate and rhythm, normal S1 and S2, no S3 or S4, and no murmur noted  ABDOMEN:  Soft non  tender BS +  MUSCULOSKELETAL:  Tenderness left foot  NEUROLOGIC:  No acute focal     Data      No results found for: PHART, PO2ART, PCO2ART, HCO3ART, BEART, O2SATART    Lab Results   Component Value Date    NA 146 09/06/2014    K 4.1 09/06/2014    CL 111 09/06/2014    CO2 30 09/06/2014    BUN 23 09/06/2014    CREATININE 0.7 09/06/2014    GLUCOSE 107 09/06/2014    CALCIUM 8.2 09/06/2014     Lab Results   Component Value Date    WBC 1.8 09/08/2014    WBC 2.6 08/30/2014    HGB 8.3 09/08/2014    HCT 25.2 09/08/2014    MCV 95.8 09/08/2014    PLT 33 09/08/2014         Lab Results   Component Value Date    INR 1.41 (H) 09/02/2014    PROTIME 16.2 (H) 09/02/2014       ASSESSMENT AND PLAN      Patient Active Problem List   Diagnosis   ??? Erectile dysfunction   ??? Thrombocytopenia ([place])   ??? Edema   ??? BPH (benign prostatic hyperplasia)   ??? Alpha-1-antitrypsin deficiency   ???  Cirrhosis, nonalcoholic ([place])   ??? Mild CAD   ??? Chronic systolic CHF (congestive heart failure) ([place])   ??? Non-ischemic cardiomyopathy ([place])   ??? Lumbar compression fracture ([place])   ??? Chronic low back pain   ??? Osteopenia   ??? Coagulopathy ([place])   ??? Hypotension   ??? Abnormal EKG   ??? Visual changes   ??? Fixed pupil of right eye   ??? Anemia   ??? Hemispheric carotid artery syndrome   ??? CAD in native artery   ??? Hyperlipidemia   ??? 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 Bronchiectasis without complication ([place])   ??? Benign essential HTN   ??? Hepatic encephalopathy (HCC)   ??? Ataxia   ??? Pancytopenia ([place])   ??? DVT (deep venous thrombosis) ([place])   ??? 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 Pneumonia   ??? Traumatic hematoma of buttock   ??? Pneumonia of both lungs due to infectious organism   ??? Hematoma   ??? Closed displaced fracture of left talus     Ct present medical management  For c diff ,  Further transfusions if any will defer to Dr Rogelia Mire decision , wife optimistic for a cure but I am now afraid he is more appropriate  For palliative care , although I realize he is relatively young

## 2014-09-07 NOTE — Progress Notes (Signed)
[facility]   Progress Note      SUBJECTIVE:      Events noted. No external bleeding, fever, chills.    OBJECTIVE    Physical  VITALS:    Visit Vitals   ??? BP 115/73   ??? Pulse 82   ??? Temp 98.4 ??F (36.9 ??C) (Axillary)   ??? Resp 18   ??? Ht 5\' 5"  (1.651 m)   ??? Wt 170 lb 8 oz (77.3 kg)   ??? SpO2 91%   ??? BMI 28.37 kg/m2     TEMPERATURE:  Current - Temp: 98.4 ??F (36.9 ??C); Max - Temp  Avg: 97.9 ??F (36.6 ??C)  Min: 97.5 ??F (36.4 ??C)  Max: 98.4 ??F (36.9 ??C)  BLOOD PRESSURE RANGE:  Systolic (08MVH), QIO:962 , Min:93 , XBM:841   ; Diastolic (32GMW), NUU:72, Min:48, Max:74    24HR INTAKE/OUTPUT:      Intake/Output Summary (Last 24 hours) at 09/07/14 1224  Last data filed at 09/06/14 1715   Gross per 24 hour   Intake   1612 ml   Output      0 ml   Net   1612 ml       Drowsy   HEENT: Pallor +   Neck: Supple. No lymphadenopathy  Lungs: CTA. Respiratory efforts normal.  CVS: S1S2 normal. No murmurs or gallops.  Abdomen: Soft BS +. No organomegaly.   Extremities: No edema  Neuro: No focal deficits.  Skin: ecchymosis on arms      Data  Labs:  General Labs:  CBC with Differential:    Lab Results   Component Value Date    WBC 2.0 09/07/2014    WBC 2.6 08/30/2014    RBC 2.47 09/07/2014    RBC 2.96 08/30/2014    HGB 7.9 09/07/2014    HCT 23.9 09/07/2014    PLT 31 09/07/2014    MCV 96.6 09/07/2014    MCH 32.1 09/07/2014    MCHC 33.3 09/07/2014    RDW 17.8 09/07/2014    SEGSPCT 42.6 05/14/2011    BANDSPCT 3 09/02/2014    METASPCT 4 09/02/2014    LYMPHOPCT 42.0 09/05/2014    LYMPHOPCT 41.9 08/30/2014    MONOPCT 0.0 09/05/2014    MYELOPCT 5 09/02/2014    EOSPCT 2.1 05/14/2010    BASOPCT 0.0 09/05/2014    MONOSABS 0.0 09/05/2014    LYMPHSABS 0.8 09/05/2014    EOSABS 0.0 09/05/2014    BASOSABS 0.0 09/05/2014    DIFFTYPE Scan-K 05/14/2011     BMP:    Lab Results   Component Value Date    NA 146 09/06/2014    K 4.1 09/06/2014    CL 111 09/06/2014    CO2 30 09/06/2014    BUN 23 09/06/2014    LABALBU 2.0 09/06/2014    CREATININE 0.7  09/06/2014    CALCIUM 8.2 09/06/2014    GFRAA >60 09/06/2014    GFRAA >60 05/14/2011    LABGLOM >60 09/06/2014    GLUCOSE 107 09/06/2014     Hepatic Function Panel:    Lab Results   Component Value Date    ALKPHOS 104 09/06/2014    ALT 25 09/06/2014    AST 40 09/06/2014    PROT 5.4 09/06/2014    PROT 6.0 05/14/2011    BILITOT 2.7 09/06/2014    BILIDIR 0.6 09/02/2014    IBILI 1.9 09/02/2014    LABALBU 2.0 09/06/2014     LDH:  No results found for:  LDH  PT/INR:    Lab Results   Component Value Date    PROTIME 16.2 09/02/2014    INR 1.41 09/02/2014    INR 1.17 05/14/2010     PTT:    Lab Results   Component Value Date    APTT 30.7 09/02/2014    APTT 29.7 10/17/2009   [APTT    Current Medications  Current Facility-Administered Medications: haloperidol lactate (HALDOL) injection 2 mg, 2 mg, Intramuscular, Q4H PRN  metronidazole (FLAGYL) 500 mg in NaCl 100 mL IVPB premix, 500 mg, Intravenous, Q8H  vancomycin (VANCOCIN) oral solution 250 mg, 250 mg, Oral, 3 times per day  carvedilol (COREG) tablet 3.125 mg, 3.125 mg, Oral, BID  vitamin D (CHOLECALCIFEROL) tablet 1,000 Units, 1,000 Units, Oral, Daily  citalopram (CELEXA) tablet 10 mg, 10 mg, Oral, Daily  ferrous sulfate tablet 325 mg, 325 mg, Oral, Daily with breakfast  magnesium oxide (MAG-OX) tablet 400 mg, 400 mg, Oral, BID  therapeutic multivitamin-minerals 1 tablet, 1 tablet, Oral, Daily  promethazine (PHENERGAN) tablet 12.5 mg, 12.5 mg, Oral, Q6H PRN  rifaximin (XIFAXAN) tablet 550 mg, 550 mg, Oral, BID  saccharomyces boulardii (FLORASTOR) capsule 250 mg, 250 mg, Oral, BID  traMADol (ULTRAM) tablet 50 mg, 50 mg, Oral, Q12H PRN  vitamin B-12 (CYANOCOBALAMIN) tablet 50 mcg, 50 mcg, Oral, Daily  0.9 % sodium chloride infusion, , Intravenous, Continuous    ASSESSMENT AND PLAN    1. 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 Monitor counts. Transfuse 1 unit PRBC  keep Hb > 8    2. C Diff colitis: Flagyl + Vanco.    3. H/O Cirrhosis    4. Hepatic encephalopathy.    Mare Loan, MD

## 2014-09-07 NOTE — Progress Notes (Signed)
Haldol given for agitation. Will continue to monitor.

## 2014-09-07 NOTE — Progress Notes (Signed)
Haldol given for agitation. Patient incontinent of urine and stool. Changed brief and repositioned patient.

## 2014-09-07 NOTE — Progress Notes (Signed)
Shift assessment completed and meds given crushed in pudding without any complication. VSS. Denies needs at this time. Call light is within reach and bed alarm on. Wife at the bedside. Will continue to monitor.

## 2014-09-07 NOTE — Progress Notes (Signed)
VSS Assessment complete, see doc flowsheets. PO meds given crushed in yogurt, pt tolerated well. PIV flushed without complications. Pt resting comfortably in bed. Patient does not appear to be in any pain. Patient mildly agitated, given haldol. Will continue to monitor. Denies any further needs at this time. Call light is within reach. Fall precautions in place.

## 2014-09-07 NOTE — Progress Notes (Signed)
VSS at this time. Incontinence brief changed and peri care provided. Patient is agitated and restless. PRN haldol given per family request. Call light within reach and daughter at the bedside. Will continue to monitor.

## 2014-09-07 NOTE — Progress Notes (Signed)
Patient incontinent of stool and urine. Whole bed changed, incontinence brief changed. Patient bathed at this time and oral care provided. Will continue to monitor.

## 2014-09-08 LAB — CBC
Hematocrit: 25.2 % — ABNORMAL LOW (ref 40.5–52.5)
Hemoglobin: 8.3 g/dL — ABNORMAL LOW (ref 13.5–17.5)
MCH: 31.5 pg (ref 26.0–34.0)
MCHC: 32.9 g/dL (ref 31.0–36.0)
MCV: 95.8 fL (ref 80.0–100.0)
MPV: 9.2 fL (ref 5.0–10.5)
Platelets: 33 10*3/uL — ABNORMAL LOW (ref 135–450)
RBC: 2.63 M/uL — ABNORMAL LOW (ref 4.20–5.90)
RDW: 16.1 % — ABNORMAL HIGH (ref 12.4–15.4)
WBC: 1.8 10*3/uL — CL (ref 4.0–11.0)

## 2014-09-08 LAB — SPECIMEN REJECTION

## 2014-09-08 MED ORDER — LIDOCAINE HCL (PF) 1 % IJ SOLN
1 % | Freq: Once | INTRAMUSCULAR | Status: DC
Start: 2014-09-08 — End: 2014-09-13

## 2014-09-08 MED ORDER — NORMAL SALINE FLUSH 0.9 % IV SOLN
0.9 % | Freq: Two times a day (BID) | INTRAVENOUS | Status: DC
Start: 2014-09-08 — End: 2014-09-19
  Administered 2014-09-09 – 2014-09-17 (×9): 10 mL via INTRAVENOUS

## 2014-09-08 MED ORDER — NORMAL SALINE FLUSH 0.9 % IV SOLN
0.9 % | INTRAVENOUS | Status: AC
Start: 2014-09-08 — End: 2014-09-08

## 2014-09-08 MED FILL — VANCOMYCIN 50 MG/ML ORAL SOLUTION: 50 mg/mL | ORAL | Qty: 5

## 2014-09-08 MED FILL — NORMAL SALINE FLUSH 0.9 % IV SOLN: 0.9 % | INTRAVENOUS | Qty: 20

## 2014-09-08 MED FILL — HALOPERIDOL LACTATE 5 MG/ML IJ SOLN: 5 MG/ML | INTRAMUSCULAR | Qty: 1

## 2014-09-08 MED FILL — CITALOPRAM HYDROBROMIDE 20 MG PO TABS: 20 MG | ORAL | Qty: 1

## 2014-09-08 MED FILL — METRONIDAZOLE IN NACL 5-0.79 MG/ML-% IV SOLN: 5 mg/mL | INTRAVENOUS | Qty: 100

## 2014-09-08 MED FILL — XIFAXAN 550 MG PO TABS: 550 MG | ORAL | Qty: 1

## 2014-09-08 MED FILL — MAGNESIUM OXIDE 400 (241.3 MG) MG PO TABS: 400 (241.3 Mg) MG | ORAL | Qty: 1

## 2014-09-08 MED FILL — VITAMIN D3 25 MCG (1000 UT) PO TABS: 25 MCG (1000 UT) | ORAL | Qty: 1

## 2014-09-08 MED FILL — THERAPEUTIC MULTIVIT/MINERAL PO TABS: ORAL | Qty: 1

## 2014-09-08 MED FILL — FLORASTOR 250 MG PO CAPS: 250 MG | ORAL | Qty: 1

## 2014-09-08 MED FILL — SODIUM CHLORIDE 0.9 % IV SOLN: 0.9 % | INTRAVENOUS | Qty: 1000

## 2014-09-08 MED FILL — VITAMIN B-12 100 MCG PO TABS: 100 MCG | ORAL | Qty: 1

## 2014-09-08 MED FILL — FERROUS SULFATE 325 (65 FE) MG PO TABS: 325 (65 Fe) MG | ORAL | Qty: 1

## 2014-09-08 MED FILL — CARVEDILOL 3.125 MG PO TABS: 3.125 MG | ORAL | Qty: 1

## 2014-09-08 NOTE — Progress Notes (Signed)
Shift assessment completed, VSS, patient asleep upon evaluation.  Pt. Changed from incontinence, wedge placed under patient's hip.  Bed in lowest position, bed alarm activated, call light and bedside table within reach. Pt's wife updated on POC, denies further questions. Will continue to monitor.    Oletta Darter Issabelle Mcraney 8:24 AM

## 2014-09-08 NOTE — Progress Notes (Signed)
Shift assessment complete. VSS, UTA pain, pt seems comfortable at this time. Pt withdrawn, confused, and limited interaction with nurse, can follow some commands. + Bowel sounds in all 4 quadrants, currently having diarrhea. Pt incontinent with voiding. Respirations even and unlabored, diminished lung sounds, discussed and encouraged deep breathing. Palpable pedal pulses noted, cap refill brisk, +1 BLE edema noted, splint/cast to left foot. POC discussed with patient's wife. Pt denies any further needs at this time. Call light within reach, bed alarm engaged.

## 2014-09-08 NOTE — Progress Notes (Signed)
New IV inserted. Fluids restarted. Patient missed midnight dose of flagyl due to loss of IV access. PRN haldol given per wife request. Call light within reach and bed alarm on. Will continue to monitor.

## 2014-09-08 NOTE — Plan of Care (Signed)
Problem: Falls - Risk of  Goal: Absence of falls  Outcome: Ongoing  Fall precautions in place, bed alarm on, nonskid foot wear applied, bed in lowest position, and call light within reach. Will continue to monitor.                  Problem: Risk for Impaired Skin Integrity  Goal: Tissue integrity - skin and mucous membranes  Structural intactness and normal physiological function of skin and  mucous membranes.   Outcome: Ongoing  Skin inspected during routine assessment.  Pt assisted with turning at least every 2 hours.  Pt checked for incontinence of both stool and urine at least every 2 hours.        Problem: Nutrition Deficit:  Goal: Ability to achieve adequate nutritional intake will improve  Ability to achieve adequate nutritional intake will improve   Outcome: Ongoing  Pt. Encouraged to drink ensures, wife at bedside to help encourage.

## 2014-09-08 NOTE — Progress Notes (Signed)
PRN haldol given at this time d/t agitation.   PCA completed bath with brief change.    RN applied pressure dressing with gauze and tegaderm to R groin site-leaking clear fluid from previous puncture.

## 2014-09-08 NOTE — Progress Notes (Signed)
Pt. Was quickly drinking his ensure when he started coughing.  Pt. Oxygen sats >90%.  Pt. Encouraged to cough.  Rhonci noted in right upper lobe.  Pt. Sitting 90 degrees, encouraged to no longer use straws.  Will continue to monitor.    Andrew [name]

## 2014-09-08 NOTE — Progress Notes (Signed)
Spoke with Pt's Nurse and explained that a bedside PICC could not be placed with a Platelet count below 50 and as of today the platelet count is 33. Will attempt PIV placement.

## 2014-09-08 NOTE — Progress Notes (Signed)
VSS. Pt pulled out IV, attempted to start a new one x2. Clinical administrator called to try to start IV. Wife at the bedside, call light within reach and bed alarm on. Will continue to monitor.

## 2014-09-08 NOTE — Progress Notes (Signed)
Department of Internal Medicine  General Internal Medicine   Progress Note      SUBJECTIVE: general weakness , presently somnolent due to meds to control agitation    History obtained from chart review and the patient  General ROS: positive for  - fatigue and malaise  negative for - chills, fever or night sweats  Psychological ROS: negative  Respiratory ROS: no cough, shortness of breath, or wheezing  Cardiovascular ROS: no chest pain or dyspnea on exertion  Gastrointestinal ROS: no abdominal pain, change in bowel habits, or black or bloody stools  Genito-Urinary ROS: no dysuria, trouble voiding, or hematuria  Musculoskeletal ROS: positive for - foot pain   negative for - joint swelling    OBJECTIVE      Medications      Current Facility-Administered Medications: lidocaine PF 1 % (PF) injection 5 mL, 5 mL, Intradermal, Once  sodium chloride flush 0.9 % injection 10 mL, 10 mL, Intravenous, 2 times per day  haloperidol lactate (HALDOL) injection 2 mg, 2 mg, Intramuscular, Q4H PRN  metronidazole (FLAGYL) 500 mg in NaCl 100 mL IVPB premix, 500 mg, Intravenous, Q8H  vancomycin (VANCOCIN) oral solution 250 mg, 250 mg, Oral, 3 times per day  carvedilol (COREG) tablet 3.125 mg, 3.125 mg, Oral, BID  vitamin D (CHOLECALCIFEROL) tablet 1,000 Units, 1,000 Units, Oral, Daily  citalopram (CELEXA) tablet 10 mg, 10 mg, Oral, Daily  ferrous sulfate tablet 325 mg, 325 mg, Oral, Daily with breakfast  magnesium oxide (MAG-OX) tablet 400 mg, 400 mg, Oral, BID  therapeutic multivitamin-minerals 1 tablet, 1 tablet, Oral, Daily  promethazine (PHENERGAN) tablet 12.5 mg, 12.5 mg, Oral, Q6H PRN  rifaximin (XIFAXAN) tablet 550 mg, 550 mg, Oral, BID  saccharomyces boulardii (FLORASTOR) capsule 250 mg, 250 mg, Oral, BID  traMADol (ULTRAM) tablet 50 mg, 50 mg, Oral, Q12H PRN  vitamin B-12 (CYANOCOBALAMIN) tablet 50 mcg, 50 mcg, Oral, Daily  0.9 % sodium chloride infusion, , Intravenous, Continuous    Physical      VITALS:    Visit Vitals   ??? BP  112/70   ??? Pulse 87   ??? Temp 97.6 ??F (36.4 ??C) (Oral)   ??? Resp 18   ??? Ht 5\' 5"  (1.651 m)   ??? Wt 170 lb 8 oz (77.3 kg)   ??? SpO2 90%   ??? BMI 28.37 kg/m2     TEMPERATURE:  Current - Temp: 97.6 ??F (36.4 ??C); Max - Temp  Avg: 98.3 ??F (36.8 ??C)  Min: 97.4 ??F (36.3 ??C)  Max: 99.7 ??F (37.6 ??C)  RESPIRATIONS RANGE: Resp  Avg: 18  Min: 18  Max: 18  PULSE RANGE: Pulse  Avg: 87  Min: 82  Max: 91  BLOOD PRESSURE RANGE:  Systolic (13YQM), VHQ:469 , Min:95 , GEX:528   ; Diastolic (41LKG), MWN:02, Min:58, Max:70    PULSE OXIMETRY RANGE: SpO2  Avg: 91.5 %  Min: 88 %  Max: 94 %  24HR INTAKE/OUTPUT:      Intake/Output Summary (Last 24 hours) at 09/09/14 0730  Last data filed at 09/09/14 7253   Gross per 24 hour   Intake   3170 ml   Output      0 ml   Net   3170 ml     CONSTITUTIONAL:  fatigued, alert, uncooperative, distracted, mild distress and appears stated age  NECK:  Mild JVD   BACK:  wnl  LUNGS:  No increased work of breathing, good air exchange, clear to auscultation bilaterally, no crackles or  wheezing  CARDIOVASCULAR:  Normal apical impulse, regular rate and rhythm, normal S1 and S2, no S3 or S4, and no murmur noted  ABDOMEN:  Soft non tender BS +  MUSCULOSKELETAL:  Tenderness left foot  NEUROLOGIC:  No acute focal     Data      No results found for: PHART, PO2ART, PCO2ART, HCO3ART, BEART, O2SATART    Lab Results   Component Value Date    NA 146 09/06/2014    K 4.1 09/06/2014    CL 111 09/06/2014    CO2 30 09/06/2014    BUN 23 09/06/2014    CREATININE 0.7 09/06/2014    GLUCOSE 107 09/06/2014    CALCIUM 8.2 09/06/2014     Lab Results   Component Value Date    WBC 1.6 09/09/2014    WBC 2.6 08/30/2014    HGB 8.1 09/09/2014    HCT 24.2 09/09/2014    MCV 95.2 09/09/2014    PLT 33 09/08/2014         Lab Results   Component Value Date    INR 1.41 (H) 09/02/2014    PROTIME 16.2 (H) 09/02/2014       ASSESSMENT AND PLAN      Patient Active Problem List   Diagnosis   ??? Erectile dysfunction   ??? Thrombocytopenia ([place])   ??? Edema   ??? BPH  (benign prostatic hyperplasia)   ??? Alpha-1-antitrypsin deficiency   ??? Cirrhosis, nonalcoholic ([place])   ??? Mild CAD   ??? Chronic systolic CHF (congestive heart failure) ([place])   ??? Non-ischemic cardiomyopathy ([place])   ??? Lumbar compression fracture ([place])   ??? Chronic low back pain   ??? Osteopenia   ??? Coagulopathy ([place])   ??? Hypotension   ??? Abnormal EKG   ??? Visual changes   ??? Fixed pupil of right eye   ??? Anemia   ??? Hemispheric carotid artery syndrome   ??? CAD in native artery   ??? Hyperlipidemia   ??? Idiopathic cardiomyopathy ([place])   ??? Bronchiectasis without complication ([place])   ??? Benign essential HTN   ??? Hepatic encephalopathy ([facility])   ??? Ataxia   ??? Pancytopenia ([place])   ??? DVT (deep venous thrombosis) ([place])   ??? 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 Pneumonia   ??? Traumatic hematoma of buttock   ??? Pneumonia of both lungs due to infectious organism   ??? Hematoma   ??? Closed displaced fracture of left talus     Ct present management discussed with wife and nursing staff  approprite for palliative care consult

## 2014-09-08 NOTE — Progress Notes (Signed)
Bedside handoff with Dorris Carnes, RN. Pt resting in bed comfortably. Denies any needs at this time, call light within reach, bed alarm engaged.    Wife at bedside.

## 2014-09-08 NOTE — Progress Notes (Signed)
Clinical administrator unable to start IV. Will call Dr. Shearon Stalls. Pt agitated, PRN haldol given. Will continue to monitor

## 2014-09-08 NOTE — Progress Notes (Signed)
VSS. Patient incontinent of urine and stool. Brief changed and patient repositioned. Call light within reach and bed alarm on. Wife at the bedside. Will continue to monitor.

## 2014-09-08 NOTE — Progress Notes (Signed)
Place 20 G- 1.75" PIV to LFA, tolerated well. Reported off to Pt.'s Nurse.

## 2014-09-08 NOTE — Progress Notes (Signed)
Occupational Therapy/Physical Therapy    Patient held this date per spouse request, stating "he has taken a turn for the worse". Patient given Haldol this am, did not awaken while PT/OT present. Will hold this date, will re-attempt as patient medically appropriate. Thank you.    Gust Brooms OTR/L [facility] DPT [phone]

## 2014-09-09 LAB — CBC WITH AUTO DIFFERENTIAL
Bands Relative: 2 % (ref 0–7)
Basophils %: 0 %
Basophils Absolute: 0 10*3/uL (ref 0.0–0.2)
Eosinophils %: 1 %
Eosinophils Absolute: 0 10*3/uL (ref 0.0–0.6)
Hematocrit: 24.2 % — ABNORMAL LOW (ref 40.5–52.5)
Hemoglobin: 8.1 g/dL — ABNORMAL LOW (ref 13.5–17.5)
Lymphocytes %: 49 %
Lymphocytes Absolute: 0.8 10*3/uL — ABNORMAL LOW (ref 1.0–5.1)
MCH: 31.9 pg (ref 26.0–34.0)
MCHC: 33.5 g/dL (ref 31.0–36.0)
MCV: 95.2 fL (ref 80.0–100.0)
MPV: 8.4 fL (ref 5.0–10.5)
Monocytes %: 10 %
Monocytes Absolute: 0.2 10*3/uL (ref 0.0–1.3)
Neutrophils %: 38 %
Neutrophils Absolute: 0.6 10*3/uL — CL (ref 1.7–7.7)
PLATELET SLIDE REVIEW: DECREASED
Platelets: 38 10*3/uL — ABNORMAL LOW (ref 135–450)
RBC: 2.54 M/uL — ABNORMAL LOW (ref 4.20–5.90)
RDW: 16.9 % — ABNORMAL HIGH (ref 12.4–15.4)
WBC: 1.6 10*3/uL — CL (ref 4.0–11.0)

## 2014-09-09 MED FILL — CITALOPRAM HYDROBROMIDE 20 MG PO TABS: 20 MG | ORAL | Qty: 1

## 2014-09-09 MED FILL — MAGNESIUM OXIDE 400 (241.3 MG) MG PO TABS: 400 (241.3 Mg) MG | ORAL | Qty: 1

## 2014-09-09 MED FILL — METRONIDAZOLE IN NACL 5-0.79 MG/ML-% IV SOLN: 5 mg/mL | INTRAVENOUS | Qty: 100

## 2014-09-09 MED FILL — HALOPERIDOL LACTATE 5 MG/ML IJ SOLN: 5 MG/ML | INTRAMUSCULAR | Qty: 1

## 2014-09-09 MED FILL — FLORASTOR 250 MG PO CAPS: 250 MG | ORAL | Qty: 1

## 2014-09-09 MED FILL — CARVEDILOL 3.125 MG PO TABS: 3.125 MG | ORAL | Qty: 1

## 2014-09-09 MED FILL — XIFAXAN 550 MG PO TABS: 550 MG | ORAL | Qty: 1

## 2014-09-09 MED FILL — VITAMIN B-12 100 MCG PO TABS: 100 MCG | ORAL | Qty: 1

## 2014-09-09 MED FILL — SODIUM CHLORIDE 0.9 % IV SOLN: 0.9 % | INTRAVENOUS | Qty: 1000

## 2014-09-09 MED FILL — VANCOMYCIN 50 MG/ML ORAL SOLUTION: 50 mg/mL | ORAL | Qty: 5

## 2014-09-09 MED FILL — NORMAL SALINE FLUSH 0.9 % IV SOLN: 0.9 % | INTRAVENOUS | Qty: 20

## 2014-09-09 MED FILL — VITAMIN D3 25 MCG (1000 UT) PO TABS: 25 MCG (1000 UT) | ORAL | Qty: 1

## 2014-09-09 MED FILL — FERROUS SULFATE 325 (65 FE) MG PO TABS: 325 (65 Fe) MG | ORAL | Qty: 1

## 2014-09-09 MED FILL — THERAPEUTIC MULTIVIT/MINERAL PO TABS: ORAL | Qty: 1

## 2014-09-09 NOTE — Progress Notes (Signed)
Spoke with Dr. Shearon Stalls regarding pt NPO status. Okay to hold all PO meds for patient including BP meds. IV flagyl added due to pt unable to take oral vanc for c.diff. Routine VSS. Pt resting in bed with family at bedside. No signs of distress noted. Assessment complete and documented in flowsheets. Bed alarm on, call light in reach.

## 2014-09-09 NOTE — Progress Notes (Signed)
Pt resting quietly in bed. Respirations even and unlabored. Bed alarm on, call light in reach. Will continue to monitor.

## 2014-09-09 NOTE — Progress Notes (Signed)
Shift assessment completed. VSS. Pt has some congestion in throat. Lungs are clear. Denies needs at this time. Call light in reach. Bed alarm on.

## 2014-09-09 NOTE — Progress Notes (Signed)
PRN haldol given at this time d/t agitation.  Brief changed d/t incontinence.   Pt resting in bed comfortably. Denies any needs at this time. Bed alarm engaged, call light within reach.

## 2014-09-09 NOTE — Progress Notes (Addendum)
Physical/Occupational Therapy  Spoke with pt nurse Luna Fuse to check on pt today. To pt room for PT eval. Pt in bed, confused, all rails up for safety.  Spoke with wife, Donia Guiles, per her request, HOLD PT today.  Talked with wife that PT and OT can come to eval pt tomorrow 09/10/14 for safety concerns with pt's confusion and LLE NWB .  Thank you , Cheron Schaumann, PT [phone]    Will hold OT eval today, based on information obtained by PT. Thank you. Dellia Beckwith, OTR/L, [id number]

## 2014-09-09 NOTE — Progress Notes (Addendum)
Department of Internal Medicine  General Internal Medicine   Progress Note      SUBJECTIVE: still very weak and somnolent  Unable to swallow     History obtained from chart review and the patient  General ROS: positive for  - fatigue and malaise  negative for - chills, fever or night sweats  Psychological ROS: negative  Respiratory ROS: no cough, shortness of breath, or wheezing  Cardiovascular ROS: no chest pain or dyspnea on exertion  Gastrointestinal ROS: no abdominal pain, change in bowel habits, or black or bloody stools  Genito-Urinary ROS: no dysuria, trouble voiding, or hematuria  Musculoskeletal ROS: positive for - foot pain   negative for - joint swelling    OBJECTIVE      Medications      Current Facility-Administered Medications: metronidazole (FLAGYL) 500 mg in NaCl 100 mL IVPB premix, 500 mg, Intravenous, Q8H  lidocaine PF 1 % (PF) injection 5 mL, 5 mL, Intradermal, Once  sodium chloride flush 0.9 % injection 10 mL, 10 mL, Intravenous, 2 times per day  haloperidol lactate (HALDOL) injection 2 mg, 2 mg, Intramuscular, Q4H PRN  vancomycin (VANCOCIN) oral solution 250 mg, 250 mg, Oral, 3 times per day  carvedilol (COREG) tablet 3.125 mg, 3.125 mg, Oral, BID  vitamin D (CHOLECALCIFEROL) tablet 1,000 Units, 1,000 Units, Oral, Daily  citalopram (CELEXA) tablet 10 mg, 10 mg, Oral, Daily  ferrous sulfate tablet 325 mg, 325 mg, Oral, Daily with breakfast  magnesium oxide (MAG-OX) tablet 400 mg, 400 mg, Oral, BID  therapeutic multivitamin-minerals 1 tablet, 1 tablet, Oral, Daily  promethazine (PHENERGAN) tablet 12.5 mg, 12.5 mg, Oral, Q6H PRN  rifaximin (XIFAXAN) tablet 550 mg, 550 mg, Oral, BID  saccharomyces boulardii (FLORASTOR) capsule 250 mg, 250 mg, Oral, BID  traMADol (ULTRAM) tablet 50 mg, 50 mg, Oral, Q12H PRN  vitamin B-12 (CYANOCOBALAMIN) tablet 50 mcg, 50 mcg, Oral, Daily  0.9 % sodium chloride infusion, , Intravenous, Continuous    Physical      VITALS:    Visit Vitals   ??? BP 112/70   ??? Pulse 89   ???  Temp 97.8 ??F (36.6 ??C) (Axillary)   ??? Resp 16   ??? Ht 5\' 5"  (1.651 m)   ??? Wt 170 lb 8 oz (77.3 kg)   ??? SpO2 93%   ??? BMI 28.37 kg/m2     TEMPERATURE:  Current - Temp: 97.8 ??F (36.6 ??C); Max - Temp  Avg: 97.9 ??F (36.6 ??C)  Min: 97.6 ??F (36.4 ??C)  Max: 98.1 ??F (36.7 ??C)  RESPIRATIONS RANGE: Resp  Avg: 17.4  Min: 16  Max: 18  PULSE RANGE: Pulse  Avg: 86.7  Min: 79  Max: 94  BLOOD PRESSURE RANGE:  Systolic (88CZY), SAY:301 , Min:97 , SWF:093   ; Diastolic (23FTD), DUK:02, Min:58, Max:84    PULSE OXIMETRY RANGE: SpO2  Avg: 92.3 %  Min: 90 %  Max: 95 %  24HR INTAKE/OUTPUT:      Intake/Output Summary (Last 24 hours) at 09/10/14 0027  Last data filed at 09/09/14 5427   Gross per 24 hour   Intake    402 ml   Output      0 ml   Net    402 ml     CONSTITUTIONAL:  fatigued, alert, uncooperative, distracted, mild distress and appears stated age  NECK:  Mild JVD   BACK:  wnl  LUNGS:  No increased work of breathing, good air exchange, clear to auscultation bilaterally, no crackles  or wheezing  CARDIOVASCULAR:  Normal apical impulse, regular rate and rhythm, normal S1 and S2, no S3 or S4, and no murmur noted  ABDOMEN:  Soft non tender BS +  MUSCULOSKELETAL:  Tenderness left foot  NEUROLOGIC:  No acute focal     Data      No results found for: PHART, PO2ART, PCO2ART, HCO3ART, BEART, O2SATART    Lab Results   Component Value Date    NA 146 09/06/2014    K 4.1 09/06/2014    CL 111 09/06/2014    CO2 30 09/06/2014    BUN 23 09/06/2014    CREATININE 0.7 09/06/2014    GLUCOSE 107 09/06/2014    CALCIUM 8.2 09/06/2014     Lab Results   Component Value Date    WBC 1.6 09/09/2014    WBC 2.6 08/30/2014    HGB 8.1 09/09/2014    HCT 24.2 09/09/2014    MCV 95.2 09/09/2014    PLT 38 09/09/2014         Lab Results   Component Value Date    INR 1.41 (H) 09/02/2014    PROTIME 16.2 (H) 09/02/2014       ASSESSMENT AND PLAN      Patient Active Problem List   Diagnosis   ??? Erectile dysfunction   ??? Thrombocytopenia ([place])   ??? Edema   ??? BPH (benign prostatic  hyperplasia)   ??? Alpha-1-antitrypsin deficiency   ??? Cirrhosis, nonalcoholic ([place])   ??? Mild CAD   ??? Chronic systolic CHF (congestive heart failure) ([place])   ??? Non-ischemic cardiomyopathy ([place])   ??? Lumbar compression fracture ([place])   ??? Chronic low back pain   ??? Osteopenia   ??? Coagulopathy ([place])   ??? Hypotension   ??? Abnormal EKG   ??? Visual changes   ??? Fixed pupil of right eye   ??? Anemia   ??? Hemispheric carotid artery syndrome   ??? CAD in native artery   ??? Hyperlipidemia   ??? Idiopathic cardiomyopathy ([place])   ??? Bronchiectasis without complication ([place])   ??? Benign essential HTN   ??? Hepatic encephalopathy ([facility])   ??? Ataxia   ??? Pancytopenia ([place])   ??? DVT (deep venous thrombosis) ([place])   ??? 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 Pneumonia   ??? Traumatic hematoma of buttock   ??? Pneumonia of both lungs due to infectious organism   ??? Hematoma   ??? Closed displaced fracture of left talus     Patient NPO , discussed with wife Hospice now emerging as an option

## 2014-09-09 NOTE — Progress Notes (Signed)
Bedside report received from [place], [place].  Pt resting in bed with family at bedside.  Haldol given per 1st shift nurse to continue ATC administration.  No further c/o at this time.  Call light within reach.  .Electronically signed by Veto Kemps, RN on 09/09/2014 at 7:50 PM

## 2014-09-09 NOTE — Progress Notes (Signed)
Upon entry of room pt's wife stated she gave pt an ice chip as advised by speech therapy and pt coughed up a medium amount of phlegm. Suction used to get most of mucous out of mouth. Mouth swabs used to clean mouth out. This RN advised pt's wife not to even use the ice chips until further notice from Dr. Shearon Stalls.

## 2014-09-09 NOTE — Progress Notes (Signed)
[facility]   Progress Note      SUBJECTIVE:      Doing OK. No fever, external bleeding. Diarrhea better.    OBJECTIVE    Physical  VITALS:    Visit Vitals   ??? BP 97/58   ??? Pulse 88   ??? Temp 98.1 ??F (36.7 ??C) (Axillary)   ??? Resp 18   ??? Ht 5\' 5"  (1.651 m)   ??? Wt 170 lb 8 oz (77.3 kg)   ??? SpO2 90%   ??? BMI 28.37 kg/m2     TEMPERATURE:  Current - Temp: 98.1 ??F (36.7 ??C); Max - Temp  Avg: 98.4 ??F (36.9 ??C)  Min: 97.6 ??F (36.4 ??C)  Max: 99.7 ??F (37.6 ??C)  BLOOD PRESSURE RANGE:  Systolic (60YTK), ZSW:109 , Min:95 , NAT:557   ; Diastolic (32KGU), RKY:70, Min:58, Max:70    24HR INTAKE/OUTPUT:      Intake/Output Summary (Last 24 hours) at 09/09/14 1245  Last data filed at 09/09/14 0619   Gross per 24 hour   Intake   3170 ml   Output      0 ml   Net   3170 ml       Drowsy but arousable. Answers questions appropriately   HEENT: Pallor +   Neck: Supple. No lymphadenopathy  Lungs: CTA. Respiratory efforts normal.  CVS: S1S2 normal. No murmurs or gallops.  Abdomen: Soft BS +. No organomegaly.   Extremities: No edema  Neuro: No focal deficits.  Skin: ecchymosis on arms      Data  Labs:  General Labs:  CBC with Differential:    Lab Results   Component Value Date    WBC 1.6 09/09/2014    WBC 2.6 08/30/2014    RBC 2.54 09/09/2014    RBC 2.96 08/30/2014    HGB 8.1 09/09/2014    HCT 24.2 09/09/2014    PLT 38 09/09/2014    MCV 95.2 09/09/2014    MCH 31.9 09/09/2014    MCHC 33.5 09/09/2014    RDW 16.9 09/09/2014    SEGSPCT 42.6 05/14/2011    BANDSPCT 2 09/09/2014    METASPCT 4 09/02/2014    LYMPHOPCT 49.0 09/09/2014    LYMPHOPCT 41.9 08/30/2014    MONOPCT 10.0 09/09/2014    MYELOPCT 5 09/02/2014    EOSPCT 2.1 05/14/2010    BASOPCT 0.0 09/09/2014    MONOSABS 0.2 09/09/2014    LYMPHSABS 0.8 09/09/2014    EOSABS 0.0 09/09/2014    BASOSABS 0.0 09/09/2014    DIFFTYPE Scan-K 05/14/2011     BMP:    Lab Results   Component Value Date    NA 146 09/06/2014    K 4.1 09/06/2014    CL 111 09/06/2014    CO2 30 09/06/2014    BUN 23  09/06/2014    LABALBU 2.0 09/06/2014    CREATININE 0.7 09/06/2014    CALCIUM 8.2 09/06/2014    GFRAA >60 09/06/2014    GFRAA >60 05/14/2011    LABGLOM >60 09/06/2014    GLUCOSE 107 09/06/2014     Hepatic Function Panel:    Lab Results   Component Value Date    ALKPHOS 104 09/06/2014    ALT 25 09/06/2014    AST 40 09/06/2014    PROT 5.4 09/06/2014    PROT 6.0 05/14/2011    BILITOT 2.7 09/06/2014    BILIDIR 0.6 09/02/2014    IBILI 1.9 09/02/2014    LABALBU 2.0 09/06/2014  LDH:  No results found for: LDH  PT/INR:    Lab Results   Component Value Date    PROTIME 16.2 09/02/2014    INR 1.41 09/02/2014    INR 1.17 05/14/2010     PTT:    Lab Results   Component Value Date    APTT 30.7 09/02/2014    APTT 29.7 10/17/2009   [APTT    Current Medications  Current Facility-Administered Medications: lidocaine PF 1 % (PF) injection 5 mL, 5 mL, Intradermal, Once  sodium chloride flush 0.9 % injection 10 mL, 10 mL, Intravenous, 2 times per day  haloperidol lactate (HALDOL) injection 2 mg, 2 mg, Intramuscular, Q4H PRN  metronidazole (FLAGYL) 500 mg in NaCl 100 mL IVPB premix, 500 mg, Intravenous, Q8H  vancomycin (VANCOCIN) oral solution 250 mg, 250 mg, Oral, 3 times per day  carvedilol (COREG) tablet 3.125 mg, 3.125 mg, Oral, BID  vitamin D (CHOLECALCIFEROL) tablet 1,000 Units, 1,000 Units, Oral, Daily  citalopram (CELEXA) tablet 10 mg, 10 mg, Oral, Daily  ferrous sulfate tablet 325 mg, 325 mg, Oral, Daily with breakfast  magnesium oxide (MAG-OX) tablet 400 mg, 400 mg, Oral, BID  therapeutic multivitamin-minerals 1 tablet, 1 tablet, Oral, Daily  promethazine (PHENERGAN) tablet 12.5 mg, 12.5 mg, Oral, Q6H PRN  rifaximin (XIFAXAN) tablet 550 mg, 550 mg, Oral, BID  saccharomyces boulardii (FLORASTOR) capsule 250 mg, 250 mg, Oral, BID  traMADol (ULTRAM) tablet 50 mg, 50 mg, Oral, Q12H PRN  vitamin B-12 (CYANOCOBALAMIN) tablet 50 mcg, 50 mcg, Oral, Daily  0.9 % sodium chloride infusion, , Intravenous, Continuous    ASSESSMENT AND  PLAN    1. MDS: On Vidaza S/P C1. Monitor counts. Transfuse to  keep Hb > 8    2. C Diff colitis: Flagyl + Vanco.    3. H/O Cirrhosis    4. Hepatic encephalopathy.    Mare Loan, MD

## 2014-09-09 NOTE — Progress Notes (Signed)
CLINICAL BEDSIDE SWALLOWING EVALUATION  Speech Therapy Department    Patient Name:  Andrew [name]  DOB:  May 15, 1940  Pain level: No c/o pain at this time  Medical Diagnosis:   Hypotension [I95.9]    HPI: Per medical chart: "The patient is a 73 year old white gentleman came to the emergency room with history of fall because of general weakness has been at rehab care. Patient suffers from myelodysplastic  syndrome and he is getting chemotherapy for the same under Hematology/oncology service here. Patient states his legs just gave way and could not hold his body, and he has contracted severe pain in the left foot. Now he is unable to walk. Patient has significant fatigue, poor appetite. There was no loss of consciousness. There were no CNS symptoms."    CXR:  Moderate amount of right basilar atelectasis versus pneumonia. ??This is very   similar to the comparison.     CT Scan:  Impression   No acute intracranial abnormality.      Mild cerebral atrophy unchanged.     Orders received this day for speech-language evaluation. Bedside swallow evaluation was completed. Speech-language evaluation to be completed as necessary. Patient presented with limited verbalizations and required increased processing time to respond. Patient did not state his name and wife provided majority of information regarding patient. Per wife, this is a recent and new decline. Nsg reported that oral suctioning revealed large amounts of breakfast foods from oral cavity, as well as absent swallow initiation with yogurt.   ??  Treatment Diagnosis:  Severe oropharyngeal Dysphagia    Impressions: Patient was positioned upright for bedside swallow evaluation. Patient presented with decreased alertness and decreased response to verbal, tactile, and visual stimuli. Oral motor examination revealed overall weakness, decreased sensory awareness, moderately decreased hyolaryngeal elevation, decreased ROM, and decreased labial seal. A variety of consistencies  were presented to determine current swallow function including applesauce, ice chips, thin by tsp, and NT applesauce. Patient demonstrated adequate mastication with ice chip. Presented with swallow x3 to clear ice chip. Moderate-severe anterior spillage with thin by tsp. Severely delayed swallow initiation. No overt s/s aspiration. Pt held NTL in oral cavity for extended period of time with anterior spillage of liquid noted. No overt s/s aspiration with applesauce, however, nsg and wife reported choking with yogurt and absent swallow initiation this am, despite max verbal and tactile cues. Patient is at high risk for aspiration due to suspected pharyngeal pooling, pocketing, decreased sensation, decreased airway protection, decreased mastication, and decreased level of alertness. Recommend NPO at this time with continued swallow assessment due to high risk of aspiration.    Dietary Recommendations: NPO (meds crushed (as able) in applesauce until doctor determines alternative means)    Strategies: 90 degree positioning with all p.o. Intake (meds), administer meds only when alert    Treatment/Goals: Speech therapy for dysphagia tx 3-5 times per week.     STG:   1.) Pt will tolerate recommended diet without s/s of aspiration   2.) Pt will tolerate MBS to r/o aspiration and determine appropriate diet/liquid level   3.) Additional goals/recs pending MBS results  4.) Pt will improve oral motor function via bolus control exercises 5/5   5.) Pt will improve sensory/motor swallow response via DPNS techniques    Oral motor Exam:  Dentition: natural, WFL  Labial/Facial: decreased labial seal, decreased ROM  Lingual: black, decreased ROM, uncoordinated movements  Voice: soft    Oral Phase:   Reduced mastication. Per wife, patient only masticates  ice chips and popsicles. No other attempts to masticate.  Reduced A-P propulsion  Apparent premature bolus loss to pharynx  Uncleared oral stasis  Anterior bolus loss      Pharyngeal  Phase:  Apparent pharyngeal pooling  Delayed swallow initiation  Apparent decreased pharyngeal clearing  Decreased airway protection    Patient/Family Education:Education, results and recommendations given to the Pt and nurse, who verbalized understanding    SLP G-Codes  Functional Limitations: Swallowing  Swallow Current Status (J3354): 100 percent impaired, limited or restricted  Swallow Goal Status (T6256): At least 80 percent but less than 100 percent impaired, limited or restricted     Timed Code Treatment:0    Total Treatment Time: 23    Discharge Recommendations:   SNF with Speech Therapy for Speech/Dysphagia treatment at discharge.    Signature: Elmarie Shiley MS, Glass blower/designer

## 2014-09-09 NOTE — Progress Notes (Signed)
Pt repositioned in bed and brief changed.

## 2014-09-09 NOTE — Progress Notes (Signed)
PRN haldol given for agitation. Routine VSS.

## 2014-09-09 NOTE — Progress Notes (Signed)
Pt resting in bed comfortably. Denies any needs at this time. Bed alarm engaged, call light within reach.

## 2014-09-09 NOTE — Progress Notes (Signed)
Prn haldol given at this time for agitation.

## 2014-09-09 NOTE — Progress Notes (Signed)
This RN put a yonker to suction in pt's room and got a medium amount of food/phlegm from pt's mouth pockets. Pt's wife attempted to feed pt yogurt. Pt was unable to swallow even with maximum cues. This RN advised the pt's wife not to feed pt until speech therapy evaluates pt. Pt resting in bed. Denies any needs at the time. No s/s of distress noted. Call light in reach.

## 2014-09-09 NOTE — Plan of Care (Signed)
Problem: Risk for Impaired Skin Integrity  Goal: Tissue integrity - skin and mucous membranes  Structural intactness and normal physiological function of skin and  mucous membranes.   Outcome: Ongoing    Problem: Nutrition Deficit:  Goal: Ability to achieve adequate nutritional intake will improve  Ability to achieve adequate nutritional intake will improve   Outcome: Ongoing

## 2014-09-10 LAB — CBC
Hematocrit: 24.2 % — ABNORMAL LOW (ref 40.5–52.5)
Hemoglobin: 8.1 g/dL — ABNORMAL LOW (ref 13.5–17.5)
MCH: 31.7 pg (ref 26.0–34.0)
MCHC: 33.4 g/dL (ref 31.0–36.0)
MCV: 94.9 fL (ref 80.0–100.0)
MPV: 8.5 fL (ref 5.0–10.5)
Platelets: 36 10*3/uL — ABNORMAL LOW (ref 135–450)
RBC: 2.55 M/uL — ABNORMAL LOW (ref 4.20–5.90)
RDW: 16.9 % — ABNORMAL HIGH (ref 12.4–15.4)
WBC: 1.7 10*3/uL — CL (ref 4.0–11.0)

## 2014-09-10 LAB — BASIC METABOLIC PANEL
Anion Gap: 6 (ref 3–16)
BUN: 30 mg/dL — ABNORMAL HIGH (ref 7–20)
CO2: 27 mmol/L (ref 21–32)
Calcium: 8.4 mg/dL (ref 8.3–10.6)
Chloride: 121 mmol/L — ABNORMAL HIGH (ref 99–110)
Creatinine: 0.8 mg/dL (ref 0.8–1.3)
GFR African American: >60 (ref >60)
GFR Non-African American: >60 (ref >60)
Glucose: 124 mg/dL — ABNORMAL HIGH (ref 70–99)
Potassium: 4.7 mmol/L (ref 3.5–5.1)
Sodium: 154 mmol/L — ABNORMAL HIGH (ref 136–145)

## 2014-09-10 LAB — AMMONIA: Ammonia: 47 umol/L (ref 16–60)

## 2014-09-10 MED ORDER — METRONIDAZOLE IN NACL 5-0.79 MG/ML-% IV SOLN
5 mg/mL | Freq: Three times a day (TID) | INTRAVENOUS | Status: DC
Start: 2014-09-10 — End: 2014-09-13
  Administered 2014-09-10 – 2014-09-13 (×10): 500 mg via INTRAVENOUS

## 2014-09-10 MED ORDER — HALOPERIDOL LACTATE 5 MG/ML IJ SOLN
5 MG/ML | Freq: Four times a day (QID) | INTRAMUSCULAR | Status: DC | PRN
Start: 2014-09-10 — End: 2014-09-19
  Administered 2014-09-13 – 2014-09-14 (×2): 2 mg via INTRAMUSCULAR

## 2014-09-10 MED ORDER — METRONIDAZOLE IN NACL 5-0.79 MG/ML-% IV SOLN
5 mg/mL | Freq: Three times a day (TID) | INTRAVENOUS | Status: DC
Start: 2014-09-10 — End: 2014-09-09

## 2014-09-10 MED FILL — FERROUS SULFATE 325 (65 FE) MG PO TABS: 325 (65 Fe) MG | ORAL | Qty: 1

## 2014-09-10 MED FILL — METRONIDAZOLE IN NACL 5-0.79 MG/ML-% IV SOLN: 5 mg/mL | INTRAVENOUS | Qty: 100

## 2014-09-10 MED FILL — XIFAXAN 550 MG PO TABS: 550 MG | ORAL | Qty: 1

## 2014-09-10 MED FILL — VANCOMYCIN 50 MG/ML ORAL SOLUTION: 50 mg/mL | ORAL | Qty: 5

## 2014-09-10 MED FILL — SODIUM CHLORIDE 0.9 % IV SOLN: 0.9 % | INTRAVENOUS | Qty: 1000

## 2014-09-10 MED FILL — HALOPERIDOL LACTATE 5 MG/ML IJ SOLN: 5 MG/ML | INTRAMUSCULAR | Qty: 1

## 2014-09-10 MED FILL — VITAMIN D3 25 MCG (1000 UT) PO TABS: 25 MCG (1000 UT) | ORAL | Qty: 1

## 2014-09-10 MED FILL — VITAMIN B-12 100 MCG PO TABS: 100 MCG | ORAL | Qty: 1

## 2014-09-10 MED FILL — FLORASTOR 250 MG PO CAPS: 250 MG | ORAL | Qty: 1

## 2014-09-10 MED FILL — CITALOPRAM HYDROBROMIDE 20 MG PO TABS: 20 MG | ORAL | Qty: 1

## 2014-09-10 MED FILL — MAGNESIUM OXIDE 400 (241.3 MG) MG PO TABS: 400 (241.3 Mg) MG | ORAL | Qty: 1

## 2014-09-10 MED FILL — THERAPEUTIC MULTIVIT/MINERAL PO TABS: ORAL | Qty: 1

## 2014-09-10 MED FILL — CARVEDILOL 3.125 MG PO TABS: 3.125 MG | ORAL | Qty: 1

## 2014-09-10 NOTE — Consults (Addendum)
Palliative Care:      74 year old with history of newly diagnosed myelodysplastic syndrome presents to ED from [facility] with generalized weakness and recent fall. Patient has been getting chemotherapy for MDS.  Patient reported  pain in his left ankle in ED. Patient was started on IV hydration.Patient has a history of  Non alcoholic liver cirrhosis, he was found to have a closed displaced fracture of left talus, unspecified portion of talus. Patient has been receiving Haldol for confusion and has not been able to participate in PT--three attempts were made. Patient has periods of intermittent agitation. Speech therapy is following--patient is NPO with occasional ice chips. Patient having difficulty swallowing. Dr Shearon Stalls met with wife. Patient was started on Vidaza two weeks ago by Dr Al Corpus. 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  Seen by ortho-recommends high tide walking boot to be placed in office then patient would be able to weight bear. Patient now has splint and is non weight bearing. Poor oral intake--patient now with C. Difficile colitis after developing diarrhea.      EXAMINATION: 3 VIEWS OF THE LEFT ANKLE 09/02/2014  FINDINGS: There is an acute mildly displaced fracture along the dorsal margin of the distal talus. No acute fracture demonstrated elsewhere and no dislocation.   ??  Acute fracture of the talus.       Echocardiogram 04/02/14:  ??-Left ventricular size is mildly increased .  ??-Left ventricular function is low normal with ejection fraction estimated at 50%.  ??-There is a question of mild inferior hypokinesis.  ??-There is mild mitral and trivial pulmonic and tricuspid regurgitation with  ??RVSP estimated at 27 mmHg.  ??-Right heart size is borderline dilated .  ??-Dilated left atrium with a volume of 3ml.    CT OF THE HEAD WITHOUT CONTRAST 09/02/2014   FINDINGS: BRAIN/VENTRICLES: There is no acute intracranial hemorrhage, mass effect or midline shift. No abnormal  extra-axial fluid collection. The gray-white differentiation is maintained without evidence of an acute infarct. There is continued mild prominence of the ventricles and sulci due to global parenchymal volume loss. There are nonspecific areas of hypoattenuation within the periventricular and subcortical white matter, which likely represent chronic microvascular ischemic change. ORBITS: The visualized portion of the orbits demonstrate no acute abnormality. SINUSES: The visualized paranasal sinuses and mastoid air cells demonstrate no acute abnormality. SOFT TISSUES/SKULL: No acute abnormality of the visualized skull or soft tissues.   ??  No acute intracranial abnormality. Mild cerebral atrophy unchanged.     Past Medical History:      Myelodysplasia, pancytopenia, pneumonia,renal stones, hypertension, hyperlipidemia, glaucoma, deep vein thrombosis, compression fracture of the lumbar vertebrae, nonalcoholic steatohepatitis, chronic systolic heart failure, arteriosclerotic heart disease, benign prostatic hypertrophy, osteoarthritis, anemia, alpha-1 antitrypsin  deficiency. ??  ????  Surgical History includes Knee surgery, eye surgery with laser, cosmetic surgery,  colonoscopy, cardiac catheterization, bone marrow biopsy, AV fistula repair. ??  ????  Advance Directives:      Code status is full     Problem Severity: Pain/Other Symptoms:      Patient is on Ultram for pain as needed    Bed Mobility/Toileting/Transfer:      Patient needs assistance with ADLs    Performance Status:      Patient is weak and deconditioned, has had recent falls. Has been receiving PT at the nursing home and was ambulating with a walker prior to hospital admission.     Symptom Assessment: Appetite/Nausea/Bowels/Fatigue:  Patient has had a poor appetite.    Psychological/Spiritual:      Patient is married and lives with his wife.    He has a son and two daughters.     Family Discussion:    Patient known to palliative care from previous hospital  admission.  Patient lethargic, Wife and daughter are in room. Wife states patient was doing well at [facility] and was able to ambulate with walker in the hall and PT. Reports he was also receiving speech therapy and was slow to respond but was able to follow commands. Discussed current clinical status. Patient has been receiving Haldol for agitation then becomes lethargic. He has only been taking ice chips. Wife reports goal of care is rehab and then home. Patient has been started on Vidaza by oncology recently, wife states one week on, three weeks off. States she has been frustrated with [employer] as insurance has denied more rehab denies. Now with fracture of talus and more weakness. Explained patient needs to be assessed by PT in order for patient to go to rehab. Patient and wife have been reluctant to consider palliative care, seems appropriate at this time. Will discuss with patient when he is more alert. Discussed with [place] Education officer, museum. Will need PT to see again when more alert.

## 2014-09-10 NOTE — Progress Notes (Signed)
Speech Language/Pathology  Dysphagia Treatment Note    Name:  Andrew [name]  DOB:   10-06-1940  Medical Diagnosis:  Hypotension [I95.9]  Treatment Diagnosis: Oropharyngeal Dysphagia  Pain level: Denies pain at this time     Current Diet Level: NPO with ongoing assessment of swallowing exception; allow ice chips    Tolerance of Recommended Diet Level:  Per chart review pt demonstrated difficulty with ice chips when provided by wife.     Assessment of Texture Tolerance:  Pt was positioned upright in bed, he was able to arouse to verbal and tactile cues however, eyes only briefly remained open. He remains confused with limited verbalization. Speech is largely unintelligible when pt attempts to communicate. Oral care was completed, pt was able to reflexively swallow with delayed swallow onset and verbal cues.     Trials of ice chip, thins and puree were provided. Pt demonstrated decreased ability to strip bolus from spoon, delayed oral initiation, decreased bolus control, suspected premature bolus loss with pharyngeal pooling and suspected decreased airway protection. Pt tolerated ice chips with no overt s/s of aspiration/penetration. Improved bolus control of nectar vs thin liquids was noted however, episodic coughing/throat clear was noted with trials of both consistency. Wet vocal quality was assessed with thin liquids. Puree trials revealed increased delay in swallow initiation with suspected reduced pharyngeal clearing with delayed coughing. Due to mildly improved swallow function from prior date wife requested RN and SLP attempt to administer medication in puree. RN crushed medications and they were given with puree x1 tsp. Pt demonstrated oral holding with no bolus manipulation despite max cues, bolus was eventually swabbed from cavity. At this time d/t oral holding with absent manipulation with meds in puree, overt s/s of aspiration/penetration with thin and nectars and decreased overall level of alertness  recommend continuation of NPO with ongoing assessment of swallowing. Allow small amounts of ice chips and lemon ice for pleasure if pt is alert and able to follow direction.     Plan:  Continued daily Dysphagia treatment with goals per plan of care.    Recommendations:  1)NPO with ongoing assessment of swallowing. Allow small amounts of ice chips and lemon ice for pleasure if pt is alert and able to follow direction.   2)Continue recommended oral motor/bolus control exercises, and DPNS treatments per clinical swallow eval Plan of Care.    Patient/Family Education:Education given to the Pt (demonstrated no comprehension), wife and nurse, who verbalized understanding. Wife was educated on s/s of aspiration/penetration and strategies to assist with feeding (i.e., tactile cues) wife v/u    Discharge Recommendations:  SNF with Speech Therapy for Speech/Dysphagia treatment at discharge.    Timed Code Treatment:  0 minutes     Total Treatment Time:  35 minutes       Joaquim Lai, [place]  Speech Language Pathologist

## 2014-09-10 NOTE — Progress Notes (Signed)
Pt resting quietly in bed. Respirations even and unlabored. Bed alarm on, call light in reach. Will continue to monitor.

## 2014-09-10 NOTE — Progress Notes (Signed)
Department of Internal Medicine  General Internal Medicine   Progress Note      SUBJECTIVE: little more awake and responsive diarrhea is  Less , swallowing difficulties reported     History obtained from chart review and the patient  General ROS: positive for  - fatigue and malaise  negative for - chills, fever or night sweats  Psychological ROS: negative  Respiratory ROS: no cough, shortness of breath, or wheezing  Cardiovascular ROS: no chest pain or dyspnea on exertion  Gastrointestinal ROS: no abdominal pain, change in bowel habits, or black or bloody stools  Genito-Urinary ROS: no dysuria, trouble voiding, or hematuria  Musculoskeletal ROS: positive for - foot pain   negative for - joint swelling    OBJECTIVE      Medications      Current Facility-Administered Medications: haloperidol lactate (HALDOL) injection 2 mg, 2 mg, Intramuscular, Q6H PRN  metronidazole (FLAGYL) 500 mg in NaCl 100 mL IVPB premix, 500 mg, Intravenous, Q8H  lidocaine PF 1 % (PF) injection 5 mL, 5 mL, Intradermal, Once  sodium chloride flush 0.9 % injection 10 mL, 10 mL, Intravenous, 2 times per day  vancomycin (VANCOCIN) oral solution 250 mg, 250 mg, Oral, 3 times per day  carvedilol (COREG) tablet 3.125 mg, 3.125 mg, Oral, BID  vitamin D (CHOLECALCIFEROL) tablet 1,000 Units, 1,000 Units, Oral, Daily  citalopram (CELEXA) tablet 10 mg, 10 mg, Oral, Daily  ferrous sulfate tablet 325 mg, 325 mg, Oral, Daily with breakfast  magnesium oxide (MAG-OX) tablet 400 mg, 400 mg, Oral, BID  therapeutic multivitamin-minerals 1 tablet, 1 tablet, Oral, Daily  promethazine (PHENERGAN) tablet 12.5 mg, 12.5 mg, Oral, Q6H PRN  rifaximin (XIFAXAN) tablet 550 mg, 550 mg, Oral, BID  saccharomyces boulardii (FLORASTOR) capsule 250 mg, 250 mg, Oral, BID  traMADol (ULTRAM) tablet 50 mg, 50 mg, Oral, Q12H PRN  vitamin B-12 (CYANOCOBALAMIN) tablet 50 mcg, 50 mcg, Oral, Daily  0.9 % sodium chloride infusion, , Intravenous, Continuous    Physical      VITALS:    Visit  Vitals   ??? BP 125/84   ??? Pulse 120   ??? Temp 97.5 ??F (36.4 ??C) (Axillary)   ??? Resp 18   ??? Ht 5\' 5"  (1.651 m)   ??? Wt 170 lb 8 oz (77.3 kg)   ??? SpO2 91%   ??? BMI 28.37 kg/m2     TEMPERATURE:  Current - Temp: 97.5 ??F (36.4 ??C); Max - Temp  Avg: 97.7 ??F (36.5 ??C)  Min: 97.4 ??F (36.3 ??C)  Max: 98.1 ??F (36.7 ??C)  RESPIRATIONS RANGE: Resp  Avg: 16.8  Min: 16  Max: 18  PULSE RANGE: Pulse  Avg: 96.6  Min: 86  Max: 120  BLOOD PRESSURE RANGE:  Systolic (01UXN), ATF:573 , Min:99 , UKG:254   ; Diastolic (27CWC), BJS:28, Min:67, Max:84    PULSE OXIMETRY RANGE: SpO2  Avg: 92.8 %  Min: 91 %  Max: 95 %  24HR INTAKE/OUTPUT:      Intake/Output Summary (Last 24 hours) at 09/10/14 2151  Last data filed at 09/10/14 1459   Gross per 24 hour   Intake   1534 ml   Output      0 ml   Net   1534 ml     CONSTITUTIONAL:  fatigued, alert, uncooperative, distracted, mild distress and appears stated age  NECK:  Mild JVD   BACK:  wnl  LUNGS:  No increased work of breathing, good air exchange, clear to auscultation bilaterally,  no crackles or wheezing  CARDIOVASCULAR:  Normal apical impulse, regular rate and rhythm, normal S1 and S2, no S3 or S4, and no murmur noted  ABDOMEN:  Soft non tender BS +  MUSCULOSKELETAL:  Tenderness left foot  NEUROLOGIC:  No acute focal     Data      No results found for: PHART, PO2ART, PCO2ART, HCO3ART, BEART, O2SATART    Lab Results   Component Value Date    NA 154 09/10/2014    K 4.7 09/10/2014    CL 121 09/10/2014    CO2 27 09/10/2014    BUN 30 09/10/2014    CREATININE 0.8 09/10/2014    GLUCOSE 124 09/10/2014    CALCIUM 8.4 09/10/2014     Lab Results   Component Value Date    WBC 1.7 09/10/2014    WBC 2.6 08/30/2014    HGB 8.1 09/10/2014    HCT 24.2 09/10/2014    MCV 94.9 09/10/2014    PLT 36 09/10/2014         Lab Results   Component Value Date    INR 1.41 (H) 09/02/2014    PROTIME 16.2 (H) 09/02/2014       ASSESSMENT AND PLAN      Patient Active Problem List   Diagnosis   ??? Erectile dysfunction   ??? Thrombocytopenia  ([place])   ??? Edema   ??? BPH (benign prostatic hyperplasia)   ??? Alpha-1-antitrypsin deficiency   ??? Cirrhosis, nonalcoholic ([place])   ??? Mild CAD   ??? Chronic systolic CHF (congestive heart failure) ([place])   ??? Non-ischemic cardiomyopathy ([place])   ??? Lumbar compression fracture ([place])   ??? Chronic low back pain   ??? Osteopenia   ??? Coagulopathy ([place])   ??? Hypotension   ??? Abnormal EKG   ??? Visual changes   ??? Fixed pupil of right eye   ??? Anemia   ??? Hemispheric carotid artery syndrome   ??? CAD in native artery   ??? Hyperlipidemia   ??? Idiopathic cardiomyopathy ([place])   ??? Bronchiectasis without complication ([place])   ??? Benign essential HTN   ??? Hepatic encephalopathy ([facility])   ??? Ataxia   ??? Pancytopenia ([place])   ??? DVT (deep venous thrombosis) ([place])   ??? Myelodysplasia (myelodysplastic syndrome) ([place])   ??? Pneumonia   ??? Traumatic hematoma of buttock   ??? Pneumonia of both lungs due to infectious organism   ??? Hematoma   ??? Closed displaced fracture of left talus     Patient  Still NPO but may show some progress in am , hematologic values are stable

## 2014-09-10 NOTE — Progress Notes (Signed)
PRN haldol given for agitation

## 2014-09-10 NOTE — Progress Notes (Signed)
Bedside report received from [place], [place].  Pt resting in bed with no s/s distress at this time.  Family at bedside.  Call light within reach.  .Electronically signed by Veto Kemps, RN on 09/10/2014 at 7:42 PM

## 2014-09-10 NOTE — Progress Notes (Signed)
Bedside report completed with Nira Conn, RN. Electronically signed by Dutch Gray, RN on 09/10/2014 at 7:35 AM

## 2014-09-10 NOTE — Progress Notes (Signed)
Occupational Therapy/Physical therapy    Patient's wife states that patient will not be able to participate in therapy evaluations at this time. Patient therapy order will be discharged at this time as 3 attempts have been made to engage patient in evaluations. Patient being given Haldol regularly per chart, remains confused with intermittent agitation. Wife understands that if patient alert and able to participate, therapy can be re-ordered. Thank you,   Gust Brooms OTR/L [facility] PT DPT [phone]

## 2014-09-10 NOTE — H&P (Signed)
PATIENT NAME:                 PA #:            MR #Andrew [name], Andrew [name]                [id number]       [id number]            ATTENDING PHYSICIAN:                  ADM DATE:   DIS DATE:          Antonieta Iba, MD               09/02/2014                     DATE OF BIRTH:   AGE:           PATIENT TYPE:     RM #:              Jul 20, 1940       73             IPF               [id number]                  HISTORY OF PRESENT ILLNESS:  Patient is a 74 year old white gentleman who was  admitted earlier and I was under the impression that I had dictated the  History and Physical but for some reason the History and Physical is nowhere  to be found.  Patient was admitted initially on September 02, 2014 when he had  presented from nursing home with history of diarrhea, altered mental status,  poor oral intake, appearing very ill.  Patient had been using his walker and  reportedly got weaker and fell.  Per wife his responsiveness was also  altered.  Patient was brought in for further evaluation.  Developed  hypotension with a blood pressure that drop down to 74/43 and respirations  were 16.  Patient was not tachycardic.  Patient appeared quite ill on arrival  and was admitted.  Patient has also been having diarrhea.     PAST MEDICAL HISTORY:  Pertinent for myelodysplastic disease, hypertension,  kidney stones, iron deficiency, chronic liver disease, thrombocytopenia,  benign prostatic hypertrophy, nonalcoholic cirrhosis, chronic systolic heart  failure, atherosclerotic heart disease, compression fracture of the lumbar  vertebra, leukopenia, anemia, pneumonia, deep venous thrombosis,  hyperlipidemia, osteoarthritis, glaucoma, chronic liver disease,  myelodysplasia, and pancytopenia.     MEDICATIONS:  Are:  1.  Vitamin B12.  2.  Tramadol.  3.  Pravastatin.  4.  Xifaxan.  5.  Phenergan.  6.  Centrum Silver.  7.  Melatonin.  8.  Mag-Ox.  9.  Lactulose.  10.  Lasix.  11.  Ferrous sulfate.  12.  Celexa.  13.  Vitamin D3.  14.   Coreg.     ALLERGIES:  Patient is allergic to SPIRONOLACTONE.     SOCIAL HISTORY:  Patient is a married man.  He has 3 children.  He was always  a nonsmoker and always a nondrinker.  He used to work for [employer].   There is no history of substance abuse.     FAMILY HISTORY:  Mother had diabetes and hypertension.  One sister had  diabetes.  Another brother also has diabetes.     REVIEW OF SYSTEMS:  Pertinent for increased lethargy, general weakness.   Patient is nonambulatory.  There is no headache, no loss of consciousness, no  seizure activity, no TIA.  There is no exertional angina.  Does have  exertional shortness of breath.  There is no orthopnea.  There is no  paroxysmal nocturnal dyspnea.  There is no hematemesis.  There is no melena,  no hematochezia, no jaundice, no ascites.  Some swelling over the  extremities.  Does not have any significant genitourinary complaints except  incontinence of feces and urine.     PHYSICAL EXAMINATION:  GENERAL:  Is alert, awake, oriented x1, moderately distressed, very  ill-appearing, 74 year old, pale-looking man.  VITAL SIGNS:  Temperature 97.6, blood pressure 74/43, respirations 16, heart  rate 56.  ORAL MUCOSA:  Dry.  SKIN:  Warm, dry and pale.  NECK:  Supple, slight jugular venous distention, no carotid bruit, no  lymphadenopathy.  LUNGS:  Vesicular breath sounds, scattered crackles at the bases,  bronchovesicular breathing pattern.  There is no wheezing.  HEART:  Regular rate and rhythm, S1 S2, relative bradycardia.  ABDOMEN:  Soft, scaphoid, bowel sounds present, no organomegaly.  EXTREMITIES:  Show bilateral 1 to 2+ pitting edema.  Distal pulses are weak.  NEUROLOGIC:  There are no acute focal deficits.  Babinski is bilaterally  absent.     LABORATORY EVALUATIONS:  Show sodium of 132, potassium 4.1, chloride 92, CO2  of 38, BUN 40, creatinine 1.2, calcium 8.8, glomerular filtration rate more  than 60, total protein 6.3, total CK 33, troponin 0.02, albumin  2.2, alkaline  phosphatase 127, ammonia level is 23.  Direct acting bilirubin is 0.6,  indirect acting is 1.6, total is 2.5.  Another set of liver enzymes was  essentially negative.  Blood sugar is 124, total white count is 3.0,  hemoglobin 9, hematocrit 27.6, platelets 41.  Stool for clostridium difficile  toxin is positive.  Coagulation profile shows PT of 16.2 and INR 1.41.       CT scan of the head showed no acute intracranial findings.  Mild cerebral  atrophy.     ASSESSMENT:  Clostridium difficile colitis, hypotension, pancytopenia,  myelodysplasia.     PLAN:  Get him admitted, treat him with IV hydration, IV Flagyl, p.o.  vancomycin.  Monitor electrolyte panel.  Will also consult Hematology,  co-management per Dr. Al Corpus who has known this patient very well.  PT, OT and  skilled nursing facility placement evaluation.     As always it is a pleasure to take care of [facility] patients at [facility].                                            [facility] [place], [place]     [id number]  DD: 09/10/2014 22:03   DT: 09/10/2014 22:48   Job #: [id number]  CC: Antonieta Iba, MD  CC: Alba Cory, MD

## 2014-09-10 NOTE — Progress Notes (Signed)
Pt resting in bed. Wife states he has been using the urinal more this afternoon. Will continue to assess.

## 2014-09-10 NOTE — Progress Notes (Signed)
PIV in R wrist removed per family request. Right arm appears slightly edematous. Elevated on pillow.

## 2014-09-10 NOTE — Progress Notes (Signed)
Spoke with Nira Conn RN regarding PICC order placed on 7/3. PICC RN was unable to place PICC due to plt count being below safe parameters. A PIV was placed instead. PIV is working well as reported by [employer] and plt count remains low. PICC order will be completed at this time. If status changes, plts > 50,000, and it is determined that a PICC is needed in future, please reorder.

## 2014-09-10 NOTE — Progress Notes (Signed)
Repositioned pt in bed. Brief clean and dry. Resting quietly with respirations even and unlabored. No signs of distress. Bed alarm on, call light in reach.

## 2014-09-10 NOTE — Progress Notes (Signed)
Pt assessment completed.  Pt denies pain.  Pt brief changed (pt was incontinent of urine).  Family at bedside.  Pt continues to be NPO per speech therapy note.  Oral medications held.  .Electronically signed by Veto Kemps, RN on 09/11/2014 at 1:57 AM

## 2014-09-10 NOTE — Progress Notes (Signed)
Assessment completed and charted. Pt confused. Wife at bedside. Wife requested haldol for pt. Pt medicated per MAR. Will continue to assess.

## 2014-09-10 NOTE — Progress Notes (Signed)
Oncology and Hematology Care   Progress Note    Subjective:  Wife states he's doing a little better. Pt is not orientated, keeps asking about his house.    Objective:  Medications:  ??? metroNIDAZOLE  500 mg Intravenous Q8H   ??? lidocaine PF  5 mL Intradermal Once   ??? sodium chloride flush  10 mL Intravenous 2 times per day   ??? vancomycin  250 mg Oral 3 times per day   ??? carvedilol  3.125 mg Oral BID   ??? vitamin D  1,000 Units Oral Daily   ??? citalopram  10 mg Oral Daily   ??? ferrous sulfate  325 mg Oral Daily with breakfast   ??? magnesium oxide  400 mg Oral BID   ??? therapeutic multivitamin-minerals  1 tablet Oral Daily   ??? rifaximin  550 mg Oral BID   ??? saccharomyces boulardii  250 mg Oral BID   ??? vitamin B-12  50 mcg Oral Daily        Physical Exam:  Vitals:    Visit Vitals   ??? BP 125/84   ??? Pulse 120   ??? Temp 97.5 ??F (36.4 ??C) (Axillary)   ??? Resp 18   ??? Ht 5\' 5"  (1.651 m)   ??? Wt 170 lb 8 oz (77.3 kg)   ??? SpO2 91%   ??? BMI 28.37 kg/m2       General:  Alert, oriented to person only, NAD  HEENT: PERRL, + icterus  Resp: no resp distress, speaking in full sentences  Cardiovascular: RRR, no MRG  GI: + ecchymoses on BUE  Psych: ++ confusion, no agitation    Labs Results:    CBC:   Recent Labs      09/08/14   0736  09/09/14   0601  09/10/14   0519   WBC  1.8*  1.6*  1.7*   HGB  8.3*  8.1*  8.1*   HCT  25.2*  24.2*  24.2*   MCV  95.8  95.2  94.9   PLT  33*  38*  36*     BMP:   Recent Labs      09/10/14   0519   NA  154*   K  4.7   CL  121*   CO2  27   BUN  30*   CREATININE  0.8     LIVER PROFILE: No results for input(s): AST, ALT, LIPASE, BILIDIR, BILITOT, ALKPHOS in the last 72 hours.    Invalid input(s):  AMYLASE,  ALB  PT/INR:    Lab Results   Component Value Date    PROTIME 16.2 09/02/2014    PROTIME 15.2 08/01/2014    PROTIME 15.7 07/25/2014    INR 1.41 09/02/2014    INR 1.33 08/01/2014    INR 1.37 07/25/2014    INR 1.17 05/14/2010    INR 1.18 02/13/2010    INR 1.15 10/17/2009     PTT:    Lab Results   Component Value  Date    APTT 30.7 09/02/2014    APTT 33.5 08/01/2014    APTT 46.5 07/25/2014    APTT 29.7 10/17/2009       Investigations Reviewed:  NO new.      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  Transfuse for Hgb < 8, plts < 10.  ??  C Diff colitis - Flagyl + Vanco.  ??  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  ??  I have discussed the above stated plan with the patient and they verbalized understanding and agreed with the plan. Thank you for allowing [place] to participate in this patients care.    Janus Molder, MD  Hematology/Oncology  [phone]

## 2014-09-11 LAB — CBC
Hematocrit: 25 % — ABNORMAL LOW (ref 40.5–52.5)
Hemoglobin: 8.3 g/dL — ABNORMAL LOW (ref 13.5–17.5)
MCH: 31.8 pg (ref 26.0–34.0)
MCHC: 33.1 g/dL (ref 31.0–36.0)
MCV: 95.9 fL (ref 80.0–100.0)
MPV: 8.8 fL (ref 5.0–10.5)
Platelets: 37 10*3/uL — ABNORMAL LOW (ref 135–450)
RBC: 2.61 M/uL — ABNORMAL LOW (ref 4.20–5.90)
RDW: 18.4 % — ABNORMAL HIGH (ref 12.4–15.4)
WBC: 1.6 10*3/uL — CL (ref 4.0–11.0)

## 2014-09-11 MED ORDER — DEXTROSE-NACL 5-0.9 % IV SOLN
INTRAVENOUS | Status: AC
Start: 2014-09-11 — End: 2014-09-12

## 2014-09-11 MED ORDER — CEFTRIAXONE SODIUM IN DEXTROSE 20 MG/ML IV SOLN
20 MG/ML | INTRAVENOUS | Status: DC
Start: 2014-09-11 — End: 2014-09-13
  Administered 2014-09-12 – 2014-09-13 (×2): 1 g via INTRAVENOUS

## 2014-09-11 MED ORDER — DEXTROSE-NACL 5-0.9 % IV SOLN
INTRAVENOUS | Status: DC
Start: 2014-09-11 — End: 2014-09-12
  Administered 2014-09-12: 05:00:00 via INTRAVENOUS

## 2014-09-11 MED ORDER — DEXTROSE-NACL 5-0.9 % IV SOLN
INTRAVENOUS | Status: DC
Start: 2014-09-11 — End: 2014-09-11

## 2014-09-11 MED ORDER — DEXTROSE-NACL 5-0.9 % IV SOLN
Freq: Once | INTRAVENOUS | Status: AC
Start: 2014-09-11 — End: 2014-09-11
  Administered 2014-09-12: 01:00:00 via INTRAVENOUS

## 2014-09-11 MED FILL — METRONIDAZOLE IN NACL 5-0.79 MG/ML-% IV SOLN: 5 mg/mL | INTRAVENOUS | Qty: 100

## 2014-09-11 MED FILL — FLORASTOR 250 MG PO CAPS: 250 MG | ORAL | Qty: 1

## 2014-09-11 MED FILL — XIFAXAN 550 MG PO TABS: 550 MG | ORAL | Qty: 1

## 2014-09-11 MED FILL — DEXTROSE-NACL 5-0.9 % IV SOLN: INTRAVENOUS | Qty: 1000

## 2014-09-11 MED FILL — CITALOPRAM HYDROBROMIDE 20 MG PO TABS: 20 MG | ORAL | Qty: 1

## 2014-09-11 MED FILL — VANCOMYCIN 50 MG/ML ORAL SOLUTION: 50 mg/mL | ORAL | Qty: 5

## 2014-09-11 MED FILL — SODIUM CHLORIDE 0.9 % IV SOLN: 0.9 % | INTRAVENOUS | Qty: 1000

## 2014-09-11 NOTE — Progress Notes (Signed)
Call out to Dr. Shearon Stalls to report EKG results and mews of 4 with a fever. No tylenol or motrin ordered.

## 2014-09-11 NOTE — Progress Notes (Signed)
Shift assessment complete. VSS, HR increased, md notified. peri care and mouth care provided. Incontinent brief changed. Pt unable to swallow meds. PRN tylenol suppository given for fever. Family at bedside. Call light in reach.

## 2014-09-11 NOTE — Progress Notes (Signed)
Department of Internal Medicine  General Internal Medicine   Progress Note      SUBJECTIVE: progressively weak , very poor po intake  Just had a swallow eval    History obtained from chart review and the patient  General ROS: positive for  - fatigue and malaise  negative for - chills, fever or night sweats  Psychological ROS: negative  Respiratory ROS: no cough, shortness of breath, or wheezing  Cardiovascular ROS: no chest pain or dyspnea on exertion  Gastrointestinal ROS: no abdominal pain, change in bowel habits, or black or bloody stools  Genito-Urinary ROS: no dysuria, trouble voiding, or hematuria  Musculoskeletal ROS: positive for - foot pain   negative for - joint swelling    OBJECTIVE      Medications      Current Facility-Administered Medications: furosemide (LASIX) injection 40 mg, 40 mg, Intravenous, Once  cefTRIAXone (ROCEPHIN) IVPB 1 g, 1 g, Intravenous, Q24H  acetaminophen (TYLENOL) suppository 650 mg, 650 mg, Rectal, Q4H PRN  dextrose 5 % and 0.9 % sodium chloride infusion, , Intravenous, Once  haloperidol lactate (HALDOL) injection 2 mg, 2 mg, Intramuscular, Q6H PRN  metronidazole (FLAGYL) 500 mg in NaCl 100 mL IVPB premix, 500 mg, Intravenous, Q8H  lidocaine PF 1 % (PF) injection 5 mL, 5 mL, Intradermal, Once  sodium chloride flush 0.9 % injection 10 mL, 10 mL, Intravenous, 2 times per day  vancomycin (VANCOCIN) oral solution 250 mg, 250 mg, Oral, 3 times per day  carvedilol (COREG) tablet 3.125 mg, 3.125 mg, Oral, BID  vitamin D (CHOLECALCIFEROL) tablet 1,000 Units, 1,000 Units, Oral, Daily  citalopram (CELEXA) tablet 10 mg, 10 mg, Oral, Daily  ferrous sulfate tablet 325 mg, 325 mg, Oral, Daily with breakfast  magnesium oxide (MAG-OX) tablet 400 mg, 400 mg, Oral, BID  therapeutic multivitamin-minerals 1 tablet, 1 tablet, Oral, Daily  promethazine (PHENERGAN) tablet 12.5 mg, 12.5 mg, Oral, Q6H PRN  rifaximin (XIFAXAN) tablet 550 mg, 550 mg, Oral, BID  saccharomyces boulardii (FLORASTOR) capsule 250  mg, 250 mg, Oral, BID  traMADol (ULTRAM) tablet 50 mg, 50 mg, Oral, Q12H PRN  vitamin B-12 (CYANOCOBALAMIN) tablet 50 mcg, 50 mcg, Oral, Daily    Physical      VITALS:    Visit Vitals   ??? BP 130/83   ??? Pulse 117   ??? Temp 97.7 ??F (36.5 ??C) (Axillary)   ??? Resp 16   ??? Ht 5\' 5"  (1.651 m)   ??? Wt 174 lb 12.8 oz (79.3 kg)   ??? SpO2 92%   ??? BMI 29.09 kg/m2     TEMPERATURE:  Current - Temp: 97.7 ??F (36.5 ??C); Max - Temp  Avg: 98.7 ??F (37.1 ??C)  Min: 97.1 ??F (36.2 ??C)  Max: 100.1 ??F (37.8 ??C)  RESPIRATIONS RANGE: Resp  Avg: 17.8  Min: 16  Max: 20  PULSE RANGE: Pulse  Avg: 118.6  Min: 99  Max: 144  BLOOD PRESSURE RANGE:  Systolic (27OZD), GUY:403 , Min:94 , KVQ:259   ; Diastolic (56LOV), FIE:33, Min:45, Max:84    PULSE OXIMETRY RANGE: SpO2  Avg: 91.5 %  Min: 90 %  Max: 93 %  24HR INTAKE/OUTPUT:      Intake/Output Summary (Last 24 hours) at 09/12/14 0755  Last data filed at 09/12/14 2951   Gross per 24 hour   Intake   2548 ml   Output      0 ml   Net   2548 ml     CONSTITUTIONAL:  fatigued,  alert, uncooperative, distracted, mild distress and appears stated age  NECK:  Mild JVD   BACK:  wnl  LUNGS:  No increased work of breathing, good air exchange, clear to auscultation bilaterally, no crackles or wheezing  CARDIOVASCULAR:  Normal apical impulse, regular rate and rhythm, normal S1 and S2, no S3 or S4, and no murmur noted  ABDOMEN:  Soft non tender BS +  MUSCULOSKELETAL:  Tenderness left foot  NEUROLOGIC:  No acute focal     Data      No results found for: PHART, PO2ART, PCO2ART, HCO3ART, BEART, O2SATART    Lab Results   Component Value Date    NA 154 09/10/2014    K 4.7 09/10/2014    CL 121 09/10/2014    CO2 27 09/10/2014    BUN 30 09/10/2014    CREATININE 0.8 09/10/2014    GLUCOSE 124 09/10/2014    CALCIUM 8.4 09/10/2014     Lab Results   Component Value Date    WBC 2.4 09/12/2014    WBC 2.6 08/30/2014    HGB 8.4 09/12/2014    HCT 25.4 09/12/2014    MCV 95.1 09/12/2014    PLT 50 09/12/2014         Lab Results   Component Value  Date    INR 1.41 (H) 09/02/2014    PROTIME 16.2 (H) 09/02/2014       ASSESSMENT AND PLAN      Patient Active Problem List   Diagnosis   ??? Erectile dysfunction   ??? Thrombocytopenia ([place])   ??? Edema   ??? BPH (benign prostatic hyperplasia)   ??? Alpha-1-antitrypsin deficiency   ??? 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 Mild CAD   ??? Chronic systolic CHF (congestive heart failure) ([place])   ??? Non-ischemic cardiomyopathy ([place])   ??? Lumbar compression fracture ([place])   ??? Chronic low back pain   ??? Osteopenia   ??? Coagulopathy ([place])   ??? Hypotension   ??? Abnormal EKG   ??? Visual changes   ??? Fixed pupil of right eye   ??? Anemia   ??? Hemispheric carotid artery syndrome   ??? CAD in native artery   ??? Hyperlipidemia   ??? Idiopathic cardiomyopathy ([place])   ??? Bronchiectasis without complication ([place])   ??? Benign essential HTN   ??? Hepatic encephalopathy ([facility])   ??? Ataxia   ??? Pancytopenia ([place])   ??? DVT (deep venous thrombosis) ([place])   ??? 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 Pneumonia   ??? Traumatic hematoma of buttock   ??? Pneumonia of both lungs due to infectious organism   ??? Hematoma   ??? Closed displaced fracture of left talus     Patient  NPO for now , wife optimistic he is getting better ,realistically he is declining  And also at the risk of being septic  Palliative care talking to her , It would be rewarding if Heme Onc service also talks to her

## 2014-09-11 NOTE — Progress Notes (Signed)
Patient has a elevated heart rate 140. MD notified. Order for 1L bolus of D5 with NS, 1 gram of Rocephin every 24 hours, blood cultures, and chest xray.  Electronically signed by Briant Cedar, RN on 09/12/2014 at 1:02 AM

## 2014-09-11 NOTE — Progress Notes (Signed)
STAT EKG done and taken by patients nurse.

## 2014-09-11 NOTE — Progress Notes (Signed)
A few medications such as florastor, rifaximin, celexa, and oral vancomycin given with speech therapist for evaluation of swallowing. No other needs expressed at this time. Will continue to monitor Velora Heckler, RN

## 2014-09-11 NOTE — Plan of Care (Signed)
Problem: Falls - Risk of  Goal: Absence of falls  Outcome: Ongoing    Problem: Risk for Impaired Skin Integrity  Goal: Tissue integrity - skin and mucous membranes  Structural intactness and normal physiological function of skin and  mucous membranes.   Outcome: Ongoing  Pt turned and repositioned q2hour and brief kept clean and dry.  .Electronically signed by Veto Kemps, RN on 09/11/2014 at 2:09 AM

## 2014-09-11 NOTE — Progress Notes (Signed)
Speech therapist evaluating patient at this time. Vital signs are stable. No other needs expressed at this time

## 2014-09-11 NOTE — Progress Notes (Signed)
HR elevated in the 140's. STAT EKG ordered. Placed on tele.

## 2014-09-11 NOTE — Progress Notes (Signed)
VS taken and WNL.  Pt changed and repositioned.  Call light within reach.  Bed alarm engaged.  .Electronically signed by Veto Kemps, RN on 09/11/2014 at 5:31 AM

## 2014-09-11 NOTE — Progress Notes (Signed)
Pt VS taken.  VS WNL.  Pt is now on 2L oxygen according to new orders received this shift.  Pt comfortable and denies pain.  Family at bedside.  ATB administered per order.  .Electronically signed by Veto Kemps, RN on 09/11/2014 at 1:54 AM

## 2014-09-11 NOTE — Progress Notes (Signed)
Shift report obtained from Suzie Portela, RN. No needs expressed at this time. Will continue to monitor Velora Heckler, RN

## 2014-09-11 NOTE — Progress Notes (Signed)
Nutrition Assessment    Type and Reason for Visit: Reassess    Nutrition Recommendations:   1. Pt now with poor PO intake x 9 days, now meets requirements for malnutrition  2. If aggressive nutrition care is desired and SLP continues to recommend NPO except for small amounts of nectar thick liquids, recommend EN support to start.  3. If EN is desired, recommend 2 cal HN at goal rate 45 mL per hour to provide 1080 mL total volume, 2160 calories, 90 grams protein, 756 mL free water.   4. Defer water bolus to cardiology d/t hx of CHF  5. Obtain updated weight as current weight is from 6/30.     Malnutrition Assessment:  ?? Malnutrition Status: Meets the criteria for severe malnutrition  ?? Context: Acute illness or injury  ?? Findings of the 6 clinical characteristics of malnutrition (Minimum of 2 out of 6 clinical characteristics is required to make the diagnosis of moderate or severe Protein Calorie Malnutrition based on AND/ASPEN Guidelines):  1. Energy Intake-Less than or equal to 50%, greater than 7 days    2. Weight Loss-10% loss or greater,  (4 months)  3. Fat Loss-Unable to assess,    4. Muscle Loss-Unable to assess,    5. Fluid Accumulation-No significant fluid accumulation,    6. Grip Strength-Not measured    Nutrition Diagnosis:   ?? Problem: Inadequate oral intake  ?? Etiology: related to Insufficient energy/nutrient consumption, Difficulty swallowing    ??? Signs and symptoms:  as evidenced by NPO status due to medical condition, Diet history of poor intake, Intake 0-25%    Nutrition Assessment:  ?? Subjective Assessment: Note pt has been NPO per SLP since 7/4. SLP re-evaluated pt this am and is now allowing pt to have small amounts of nectar thick liquids and lemon ice for pleasure. Pt's wife does report he likes both Ensure Clear and Ensure Enlive. Pt now with poor/no PO intake x 9 days. If pt is to remain full code and aggressive nutrition care is desired, recommend to consider EN to start.    ?? Current  Nutrition Therapies:  ?? Oral Diet Orders: NPO (Small amounts of nectar thick liquids and lemon ice ok per SLP)   ?? Oral Diet intake: NPO  ?? Oral Nutrition Supplement (ONS) Orders: Standard High Calorie Oral Supplement, Clear Liquid Oral Supplement  ?? ONS intake: NPO  ?? Anthropometric Measures:  ?? Ht: 5\' 5"  (165.1 cm)   ?? Current Body Wt: 170 lb (77.1 kg)  ?? Usual Body Wt: 188 lb (85.3 kg) (per wife)  ?? Ideal Body Wt: 136 lb (61.7 kg)   ?? BMI Classification: BMI 25.0 - 29.9 Overweight  ?? Comparative Standards (Estimated Nutrition Needs):  ?? Estimated Daily Total Kcal: 1933-2319  ?? Estimated Daily Protein (g): 93-116 grams     Estimated Intake vs Estimated Needs: Intake Less Than Needs    Nutrition Risk Level: High    Nutrition Interventions:   Continue current diet, Start Tube Feeding  Continued Inpatient Monitoring, Education not appropriate at this time, Speech Therapy    Nutrition Evaluation:   ?? Evaluation: Goals set   ?? Goals: Diet advancement per SLP vs EN support     ?? Monitoring: Diet Progression, NPO Status, Tolerance to Diet, Chewing/Swallowing, Comparative Standards, Weight Status, Mental Status/Confusion    See Adult Nutrition Doc Flowsheet for more detail.     Electronically signed by Greer Ee, RD, LD on 09/11/14 at 11:28 AM    Contact  Number: [phone]

## 2014-09-11 NOTE — Progress Notes (Signed)
Provider notified of Xray results. D/C order for bolus, Wants fluid ran at 125 ml/hr. Will continue to monitor

## 2014-09-11 NOTE — Progress Notes (Signed)
Speech Language/Pathology  Dysphagia Treatment Note    Name:  Andrew [name]  DOB:   19-Oct-1940  Medical Diagnosis:  Hypotension [I95.9]  Treatment Diagnosis: Oropharyngeal Dysphagia  Pain level: Denies pain at this time     Current Diet Level: NPO with ongoing assessment of swallowing exception; allow ice chips    Tolerance of Recommended Diet Level:  No overt difficulties noted with small amounts of lemon ice, per wife.     Assessment of Texture Tolerance:  Pt was positioned upright in bed, increased alertness and responsiveness noted from prior date. Per wife and RN pt has not received halodol since prior date. Pt able to answer simple questions however, requires increased time to respond. Able to follow 1 step directions with delayed processing. Per chart review pt required 2 L O2 via nasal cannula last PM however, wife reported this is typical that pt will sometimes require O2 for brief periods of time. O2 ranged from 88-90 during assessment. Trials of ice chips, thin liquids via tsp, nectars via tsp and puree were provided. Pt demonstrated delayed responses to open oral cavity and seal lips, benefited from verbal cues for sequencing. Decreased oral manipulation, decreased A-P propulsion with suspected premature bolus loss and pharyngeal pooling are noted. Swallow onset ranged from mild-moderately delayed with intermittent verbal cues. Improved swallow timing was noted with nectars and puree vs thin liquids. Thin liquids revealed intermittent overt clinical and silent s/s of aspiration/penetration. One instance of coughing was assessed with nectar thick liquids post puree trial. SLP assisted RN with administering medications crushed in puree. Medications were given a 1/2 tsp at a time with pt demonstrating delayed swallow onset with no overt s/s of aspiration. Pt remains at increased risk for aspiration due to suspected premature bolus loss and mild/moderately delayed swallow onset. At this time recommend NPO  with the exception of meds crushed in puree, small sips of nectar thick liquids via tsp for pleasure. If respiratory status declines or s/s of aspiration/penetration are assessed recommend downgrade to strict NPO.     Plan:  Continued daily Dysphagia treatment with goals per plan of care.    Recommendations:  1) At this time recommend NPO with the exception of meds crushed in puree, small sips of nectar thick liquids via tsp for pleasure  2) If respiratory status declines or s/s of aspiration/penetration are assessed recommend downgrade to strict NPO.   3)Continue recommended oral motor/bolus control exercises, and DPNS treatments per clinical swallow eval Plan of Care.    Patient/Family Education:Education given to the Pt (demonstrated no comprehension), wife and nurse, who verbalized understanding. Wife was educated again on s/s of aspiration/penetration and strategies to assist with feeding (i.e., tactile cues) wife v/u. Also discussed proper protocol for thickening liquids.     Discharge Recommendations:  SNF with Speech Therapy for Speech/Dysphagia treatment at discharge.    Timed Code Treatment:  0 minutes     Total Treatment Time:  30 minutes       Joaquim Lai, [place]  Speech Language Pathologist

## 2014-09-12 ENCOUNTER — Inpatient Hospital Stay: Admit: 2014-09-12 | Primary: Internal Medicine

## 2014-09-12 LAB — CBC
Hematocrit: 25.4 % — ABNORMAL LOW (ref 40.5–52.5)
Hemoglobin: 8.4 g/dL — ABNORMAL LOW (ref 13.5–17.5)
MCH: 31.6 pg (ref 26.0–34.0)
MCHC: 33.2 g/dL (ref 31.0–36.0)
MCV: 95.1 fL (ref 80.0–100.0)
MPV: 8.7 fL (ref 5.0–10.5)
PLATELET SLIDE REVIEW: DECREASED
Platelets: 50 10*3/uL — ABNORMAL LOW (ref 135–450)
RBC: 2.67 M/uL — ABNORMAL LOW (ref 4.20–5.90)
RDW: 18 % — ABNORMAL HIGH (ref 12.4–15.4)
WBC: 2.4 10*3/uL — ABNORMAL LOW (ref 4.0–11.0)

## 2014-09-12 LAB — COMPREHENSIVE METABOLIC PANEL
ALT: 27 U/L (ref 10–40)
AST: 47 U/L — ABNORMAL HIGH (ref 15–37)
Albumin/Globulin Ratio: 0.5 — ABNORMAL LOW (ref 1.1–2.2)
Albumin: 1.8 g/dL — ABNORMAL LOW (ref 3.4–5.0)
Alkaline Phosphatase: 98 U/L (ref 40–129)
Anion Gap: 8 (ref 3–16)
BUN: 40 mg/dL — ABNORMAL HIGH (ref 7–20)
CO2: 23 mmol/L (ref 21–32)
Calcium: 8.3 mg/dL (ref 8.3–10.6)
Chloride: 123 mmol/L — ABNORMAL HIGH (ref 99–110)
Creatinine: 1.1 mg/dL (ref 0.8–1.3)
GFR African American: >60 (ref >60)
GFR Non-African American: >60 (ref >60)
Globulin: 3.5 g/dL
Glucose: 210 mg/dL — ABNORMAL HIGH (ref 70–99)
Potassium: 4.1 mmol/L (ref 3.5–5.1)
Sodium: 154 mmol/L — ABNORMAL HIGH (ref 136–145)
Total Bilirubin: 2.1 mg/dL — ABNORMAL HIGH (ref 0.0–1.0)
Total Protein: 5.3 g/dL — ABNORMAL LOW (ref 6.4–8.2)

## 2014-09-12 LAB — EKG 12-LEAD
Atrial Rate: 141 {beats}/min
P Axis: -43 degrees
P-R Interval: 104 ms
Q-T Interval: 308 ms
QRS Duration: 94 ms
QTc Calculation (Bazett): 471 ms
R Axis: -25 degrees
T Axis: 194 degrees
Ventricular Rate: 141 {beats}/min

## 2014-09-12 MED ORDER — DEXTROSE-NACL 5-0.9 % IV SOLN
Freq: Once | INTRAVENOUS | Status: DC
Start: 2014-09-12 — End: 2014-09-13

## 2014-09-12 MED ORDER — ACETAMINOPHEN 650 MG RE SUPP
650 MG | RECTAL | Status: DC | PRN
Start: 2014-09-12 — End: 2014-09-19
  Administered 2014-09-12 – 2014-09-15 (×2): 650 mg via RECTAL

## 2014-09-12 MED ORDER — DARBEPOETIN ALFA 300 MCG/ML IJ SOLN
300 MCG/ML | Freq: Once | INTRAMUSCULAR | Status: DC
Start: 2014-09-12 — End: 2014-09-13

## 2014-09-12 MED ORDER — FUROSEMIDE 10 MG/ML IJ SOLN
10 MG/ML | Freq: Once | INTRAMUSCULAR | Status: AC
Start: 2014-09-12 — End: 2014-09-12
  Administered 2014-09-12: 13:00:00 40 mg via INTRAVENOUS

## 2014-09-12 MED FILL — MAGNESIUM OXIDE 400 (241.3 MG) MG PO TABS: 400 (241.3 Mg) MG | ORAL | Qty: 1

## 2014-09-12 MED FILL — CARVEDILOL 3.125 MG PO TABS: 3.125 MG | ORAL | Qty: 1

## 2014-09-12 MED FILL — THERAPEUTIC MULTIVIT/MINERAL PO TABS: ORAL | Qty: 1

## 2014-09-12 MED FILL — VANCOMYCIN 50 MG/ML ORAL SOLUTION: 50 mg/mL | ORAL | Qty: 5

## 2014-09-12 MED FILL — DEXTROSE-NACL 5-0.9 % IV SOLN: INTRAVENOUS | Qty: 1000

## 2014-09-12 MED FILL — VITAMIN B-12 100 MCG PO TABS: 100 MCG | ORAL | Qty: 1

## 2014-09-12 MED FILL — FUROSEMIDE 10 MG/ML IJ SOLN: 10 MG/ML | INTRAMUSCULAR | Qty: 4

## 2014-09-12 MED FILL — NORMAL SALINE FLUSH 0.9 % IV SOLN: 0.9 % | INTRAVENOUS | Qty: 10

## 2014-09-12 MED FILL — ACETAMINOPHEN 650 MG RE SUPP: 650 MG | RECTAL | Qty: 1

## 2014-09-12 MED FILL — CITALOPRAM HYDROBROMIDE 20 MG PO TABS: 20 MG | ORAL | Qty: 1

## 2014-09-12 MED FILL — FLORASTOR 250 MG PO CAPS: 250 MG | ORAL | Qty: 1

## 2014-09-12 MED FILL — ARANESP (ALBUMIN FREE) 300 MCG/ML IJ SOLN: 300 MCG/ML | INTRAMUSCULAR | Qty: 1

## 2014-09-12 MED FILL — XIFAXAN 550 MG PO TABS: 550 MG | ORAL | Qty: 1

## 2014-09-12 MED FILL — METRONIDAZOLE IN NACL 5-0.79 MG/ML-% IV SOLN: 5 mg/mL | INTRAVENOUS | Qty: 100

## 2014-09-12 MED FILL — FERROUS SULFATE 325 (65 FE) MG PO TABS: 325 (65 Fe) MG | ORAL | Qty: 1

## 2014-09-12 MED FILL — ROCEPHIN IN DEXTROSE 20 MG/ML IV SOLN: 20 MG/ML | INTRAVENOUS | Qty: 50

## 2014-09-12 MED FILL — VITAMIN D3 25 MCG (1000 UT) PO TABS: 25 MCG (1000 UT) | ORAL | Qty: 1

## 2014-09-12 NOTE — Progress Notes (Signed)
Pt had a 6 beat run of V-Tach. Checked on Pt and was working w/ PT/OT. Went back to NSR. Wells Guiles

## 2014-09-12 NOTE — Progress Notes (Signed)
Shift assessment complete. VS stable. Part of meds administered by crushing in applesauce. Remaining of meds could not be swallowed. Expiratory wheezes in all lobes. Bowel sounds present. Breathing easy and unlabored. Edema in RUE +4 non pitting, LUE +3 non pitting, RLE +2 non pitting, LLE +2 non pitting. Pt resting comfortably in bed w/ call light within reach and bed alarm engaged. Wife at bedside. Andrew [name]

## 2014-09-12 NOTE — Progress Notes (Signed)
Occupational Therapy/Physical therapy    Evaluation orders noted. Attempted evaluation this am, wife requests to hold patient, stating "he is doing worse again".  Wife educated regarding reason for OT/PT referral, wife declined.  RN notified. Will re-attempt in pm, wife aware. Thank you,   Gust Brooms OTR/L [facility] [phone]

## 2014-09-12 NOTE — Plan of Care (Signed)
Problem: Falls - Risk of  Goal: Absence of falls  Outcome: Ongoing  Bed alarm on. Bed in lowest/locked position. Call light within reach    Problem: Risk for Impaired Skin Integrity  Goal: Tissue integrity - skin and mucous membranes  Structural intactness and normal physiological function of skin and  mucous membranes.   Outcome: Ongoing  Skin assessment qs. Reposition pt q 2 hours. Skin kept clean and dry

## 2014-09-12 NOTE — Progress Notes (Signed)
Oncology and Hematology Care   Progress Note      09/12/2014 7:52 AM        Name: Andrew [name] .              Admitted: 09/02/2014    SUBJECTIVE:  Pt having low grade fevers overnight.  Worsening resp status.  Still very confused.  Diarrhea improved.    Reviewed interval ancillary notes    Current Medications    furosemide (LASIX) injection 40 mg Once   cefTRIAXone (ROCEPHIN) IVPB 1 g Q24H   acetaminophen (TYLENOL) suppository 650 mg Q4H PRN   dextrose 5 % and 0.9 % sodium chloride infusion Once   haloperidol lactate (HALDOL) injection 2 mg Q6H PRN   metronidazole (FLAGYL) 500 mg in NaCl 100 mL IVPB premix Q8H   lidocaine PF 1 % (PF) injection 5 mL Once   sodium chloride flush 0.9 % injection 10 mL 2 times per day   vancomycin (VANCOCIN) oral solution 250 mg 3 times per day   carvedilol (COREG) tablet 3.125 mg BID   vitamin D (CHOLECALCIFEROL) tablet 1,000 Units Daily   citalopram (CELEXA) tablet 10 mg Daily   ferrous sulfate tablet 325 mg Daily with breakfast   magnesium oxide (MAG-OX) tablet 400 mg BID   therapeutic multivitamin-minerals 1 tablet Daily   promethazine (PHENERGAN) tablet 12.5 mg Q6H PRN   rifaximin (XIFAXAN) tablet 550 mg BID   saccharomyces boulardii (FLORASTOR) capsule 250 mg BID   traMADol (ULTRAM) tablet 50 mg Q12H PRN   vitamin B-12 (CYANOCOBALAMIN) tablet 50 mcg Daily       Objective:  Visit Vitals   ??? BP 130/83   ??? Pulse 117   ??? Temp 97.7 ??F (36.5 ??C) (Axillary)   ??? Resp 16   ??? Ht 5\' 5"  (1.651 m)   ??? Wt 174 lb 12.8 oz (79.3 kg)   ??? SpO2 92%   ??? BMI 29.09 kg/m2       Intake/Output Summary (Last 24 hours) at 09/12/14 0752  Last data filed at 09/12/14 9323   Gross per 24 hour   Intake   2548 ml   Output      0 ml   Net   2548 ml    Wt Readings from Last 3 Encounters:   09/11/14 174 lb 12.8 oz (79.3 kg)   08/30/14 167 lb 9.6 oz (76 kg)   08/29/14 169 lb (76.7 kg)       General appearance:  Appears comfortable, sickly.  Lethargic.  Eyes: Sclera clear. Pupils equal.  ENT: Moist oral mucosa. Trachea  midline, no adenopathy.  Cardiovascular: Regular rhythm, normal S1, S2. No murmur. No edema in lower extremities  Respiratory: Not using accessory muscles. Expiratory wheezes.  Bibasilar rales.  GI: Abdomen soft, no tenderness, not distended, normal bowel sounds  Musculoskeletal: No cyanosis in digits, neck supple  Neurology: CN 2-12 grossly intact. No speech or motor deficits  Psych: confused.  Skin: Warm, dry, normal turgor    Labs and Tests:  CBC:   Recent Labs      09/10/14   0519  09/11/14   0623  09/12/14   0534   WBC  1.7*  1.6*  2.4*   HGB  8.1*  8.3*  8.4*   PLT  36*  37*  50*     BMP:  Recent Labs      09/10/14   0519   NA  154*   K  4.7   CL  121*   CO2  27   BUN  30*   CREATININE  0.8   GLUCOSE  124*     Hepatic: No results for input(s): AST, ALT, ALB, BILITOT, ALKPHOS in the last 72 hours.    ASSESSMENT AND PLAN    Active Problems:    Chronic systolic CHF (congestive heart failure) ([facility])    Hypotension    Idiopathic cardiomyopathy ([facility])    Closed displaced fracture of left talus      MDS - s/p 1 cycle Vidaza.  Transfuse PRBC for hgb < 8 and plts < 20.  Will repeat Aranesp tomorrow.    Cdiff - diarrhea improved.  On flagyl and vanc.    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  Stop IVF.  Give 40mg  IV lasix x 1 now.    Acute resp failure - due to fluid overload/ CHF.  CXR reviewed.  Possible pneumonia?  Will see how his breathing does after he diureses.  Was started on empiric rocephin last night.    Hypernatremia - check labs.  Stop IVF.    Metabolic encephalopathy - 2/2 infection and electrolyte derangements.    Discussed with pt's wife at the bedside.    [name], [place]  Oncology Hematology Care  Office: [phone]  Cell: [phone]     Patient seen and examined. Agree with above. Patient's wife wants to continue current treatment.    Mare Loan, MD

## 2014-09-12 NOTE — Progress Notes (Signed)
Pt. In bed at this time. Family at the bedside. Call light in reach.

## 2014-09-12 NOTE — Plan of Care (Signed)
Problem: Falls - Risk of  Goal: Absence of falls  Outcome: Ongoing  Bed in lowest/locked position. Bed alarm on. Fall assessment qs

## 2014-09-12 NOTE — Progress Notes (Signed)
Shift assessment complete.  Patient noted to have Upper and Lower BLE.  New IV noted to be leaking and will not flush.  Will remove.  Routine VSS.  Patient given rifaxim and coreg crushed in applesauce.   Ceftriaxone started. Patient changed and pericare provided.  Mouth suctioned and mouthcare given.  Patient tolerated poorly.  Patient does not appear to be in pain.  Call light in reach and family at bedside.  Bed alarm in use.Electronically signed by Dede Query, RN on 09/12/2014 at 8:40 PM

## 2014-09-12 NOTE — Progress Notes (Signed)
Physical Therapy  Facility/Department: [facility] ONCOLOGY  Initial Assessment    NAME: Andrew [name]  DOB: [birth year]  MRN: [id number]    Date of Service: 09/12/2014    Patient Diagnosis(es):   Patient Active Problem List    Diagnosis Date Noted   ??? Erectile dysfunction 04/30/2009     Priority: Low   ??? Closed displaced fracture of left talus 09/03/2014   ??? Hematoma 07/31/2014   ??? Pneumonia of both lungs due to infectious organism    ??? Traumatic hematoma of buttock    ??? Pneumonia 07/25/2014   ??? 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 DVT (deep venous thrombosis) ([place]) 07/10/2014   ??? 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 Ataxia 07/07/2014   ??? Pancytopenia ([place]) 07/07/2014   ??? Benign essential HTN 07/03/2014   ??? Bronchiectasis without complication ([place]) 09/81/1914   ??? 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 Anemia 04/02/2014   ??? Hemispheric carotid artery syndrome    ??? CAD in native artery    ??? Hyperlipidemia    ??? Hypotension 04/01/2014   ??? Abnormal EKG 04/01/2014   ??? Visual changes 04/01/2014   ??? Lumbar compression fracture ([place]) 01/14/2014   ??? Chronic low back pain 01/14/2014   ??? Osteopenia 01/14/2014   ??? Coagulopathy ([place]) 01/14/2014   ??? Non-ischemic cardiomyopathy ([place]) 11/06/2013   ??? Chronic systolic CHF (congestive heart failure) ([place]) 10/18/2013   ??? Mild CAD    ??? Cirrhosis, nonalcoholic ([place])    ??? 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 BPH (benign prostatic hyperplasia) 08/01/2009   ??? Thrombocytopenia ([place]) 04/30/2009   ??? Edema 04/30/2009       Past Medical History   Diagnosis Date   ??? Allergic    ??? Alpha 1 antitrypsin deficiency    ??? Anemia    ??? Arthritis    ??? BPH (benign prostatic hyperplasia)    ??? CAD (coronary artery disease)      Non-obstructive   ??? Cancer ([place])    ??? Chronic systolic CHF (congestive heart failure) ([place]) 10/18/2013   ??? 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 Clostridium difficile diarrhea  09/04/2014   ??? Compression fracture of lumbar vertebra (H[facility])      multiple, lumbar   ??? DVT (deep venous thrombosis) ([place])    ??? Glaucoma    ??? Hyperlipidemia    ??? Hypertension    ??? Kidney stones    ??? Leukopenia    ??? Liver disease    ??? 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 Pancytopenia ([place])    ??? Pneumonia    ??? Thrombocytopenia ([place]) 04/30/2009     Past Surgical History   Procedure Laterality Date   ??? Av fistula repair  1970's   ??? Colonoscopy  4/12     5y   ??? Cardiac catheterization     ??? Eye surgery  04/12/2014     Laser Eye Surgery for glaucoma   ??? Cosmetic surgery  04/2013     eye lids   ??? Bone marrow biopsy     ??? Knee surgery           Restrictions  Restrictions/Precautions  Restrictions/Precautions: Fall Risk, Weight Bearing (high fall risk, NWB LLE)  Lower Extremity Weight Bearing Restrictions  Left Lower Extremity Weight Bearing: Non Weight Bearing  Vision/Hearing  Vision: Within Functional Limits  Hearing: Within  functional limits     Subjective  General  Chart Reviewed: Yes  Additional Pertinent Hx: BPH, CAD, CA, Lumbar compression fx, HTN, Myelodysplasia  Response To Previous Treatment: Not applicable  Family / Caregiver Present: Yes (spouse (Spouse provided information))  Diagnosis: fall, L talus fx  Follows Commands: Impaired  General Comment  Comments: Patient in supine, awakened to name, follows commands with delayed response  Pain Screening  Patient Currently in Pain: Denies  Vital Signs  Patient Currently in Pain: Denies       Orientation  Orientation  Overall Orientation Status: Impaired  Orientation Level: Oriented to person;Disoriented to place;Disoriented to time;Disoriented to situation  Social/Functional History  Social/Functional History  Lives With: Spouse  Type of Home: House  Home Layout: Two level, Bed/Bath upstairs  Home Access: Stairs to enter without rails  Entrance Stairs - Number of Steps: 1+1 STE  Bathroom Shower/Tub: Tourist information centre manager:  Soil scientist: 3-in-1 commode, Furniture conservator/restorer Accessibility: Accessible, Environmental consultant accessible  Home Equipment: Rolling walker, Hospital bed  Receives Help From: Family  ADL Assistance: Independent  Homemaking Assistance: Needs assistance  Homemaking Responsibilities: No  Ambulation Assistance: Independent  Transfer Assistance: Teacher, English as a foreign language: Yes  Mode of Transportation: Truck  Occupation: Retired  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, Youth worker Work, Cardiac Rehab  Additional Comments: May 2-prior to this date, patient Independent, 2 falls in the last 6 months (both recent)  Objective  RLE (degrees)  RLE ROM: WFL;Active  ROM LLE (degrees)  LLE ROM: WFL;Active  LLE General ROM: did not formally assess L ankle   Strength RLE  Comment: unable to formally assess via MMT secondary to impaired cognition; pt demonstrated knee extension bilaterally while seated at EOB   Strength LLE  Comment: unable to formally assess via MMT secondary to impaired cognition; pt demonstrated knee extension bilaterally while seated at EOB   Tone RLE  RLE Tone: Normotonic  Tone LLE  LLE Tone: Normotonic  Motor Control  Gross Motor?: WFL  Sensation  Overall Sensation Status:  (Pt unable to report)  Bed Mobility  Rolling: Maximal assistance (to roll to L/R)  Supine to Sit: Dependent/Total (modA x2)  Sit to Supine: Dependent/Total (maxA x2)  Scooting: Dependent/Total (modA x2)  Transfers  Sit to Stand: Unable to assess(comment) (unsafe to attempt this date)  Ambulation  Ambulation?: No  Stairs/Curb  Stairs?: No     Balance  Posture: Fair (forward head, rounded shoulders)  Sitting - Static: Fair;+ (CGA progressing to SBA)  Comments: Pt sat EOB x10 minutes requiring CGA progressing to SBA for static sitting balance.         Assessment   Assessment: Decreased functional mobility ;Decreased strength;Decreased safe awareness;Decreased cognition;Decreased endurance;Decreased balance;Decreased  coordination  Assessment: Pt currently not at baseline functioning related to recent dx and would benefit from skilled PT intervention to address stated deficits and facilitate return to PLOF.   Treatment Diagnosis: decreased functional mobility, decreased strength, decreased safety awareness, decreased cognition, decreased endurance, decreased balance, decreased coordination  Prognosis: Fair;Guarded  Patient Education: Educated pt and spouse on role of PT, POC, and discharge recommendations.   Barriers to Learning: cognition  Requires PT Follow Up: Yes  Total Treatment Time: 45  Timed Code Treatment Minutes: 30 Minutes  Activity Tolerance  Activity Tolerance: Patient limited by fatigue;Patient limited by cognitive status  PT Equipment Recommendations  Equipment Needed:  (TBD at next level of care)  Discharge Recommendations:  [place], Patient would benefit from continued therapy after discharge      Plan   Plan  Times per week: 2-5x  Times per day: Daily  Specific instructions for Next Treatment: simple ADLs, lateral transfer  Current Treatment Recommendations: Strengthening;ROM;Balance Training;Functional Mobility Training;Transfer Training;Engineer, production;Wheelchair Mobility Training;Neuromuscular Re-education;Pain Management;Home Exercise Program;Safety Education & Youth worker, Education, Designer, industrial/product Devices  Type of devices: All fall risk precautions in place;Bed alarm in place;Call light within reach;Patient at risk for falls;Left in bed;Nurse notified  Restraints  Initially in place: No    G-Code  PT G-Codes  Functional Assessment Tool Used: PT evaluation   Functional Limitation: Changing and maintaining body position  Changing and Maintaining Body Position Current Status (X5170): At least 80 percent but less than 100 percent impaired, limited or restricted  Changing and Maintaining Body Position Goal Status  (Y1749): At least 60 percent but less than 80 percent impaired, limited or restricted    Goals  Short term goals  Time Frame for Short term goals: to be met prior to discharge   Short term goal 1: Pt to complete all bed mobility with maxA.   Short term goal 2: Pt to sit EOB x10 minutes with SUPV.   Short term goal 3: Patient will complete lateral, slide board transfer (due to NWB LLE) with maxA of 1.        Genene Churn, PT     Genene Churn PT, [place] [id number]

## 2014-09-12 NOTE — Progress Notes (Signed)
Occupational Therapy   Occupational Therapy Initial Assessment  Date: 09/12/2014   Patient Name: Andrew [name]  MRN: [id number]     DOB: 1940/10/01    Treatment Diagnosis: Decreased ADLs, transfers, activity tolerance associated with fall, talus fx      Restrictions  Restrictions/Precautions  Restrictions/Precautions: Fall Risk, Weight Bearing (High fall risk, NWB LLE)  Lower Extremity Weight Bearing Restrictions  Left Lower Extremity Weight Bearing: Non Weight Bearing  Vision/Hearing  Vision: Within Functional Limits  Hearing: Within functional limits     Subjective   General  Chart Reviewed: Yes  Additional Pertinent Hx: BPH, CAD, CA, Lumbar compression fx, HTN, Myelodysplasia  Family / Caregiver Present: Yes (Spouse provided information)  Diagnosis: Fall, L talus fx  Subjective  Subjective: Patient in supine, awakened to name, follows commands with delayed response  Pain Assessment  Patient Currently in Pain: Denies  Pain Assessment: 0-10  Wong-Baker Pain Rating: No hurt  Pain Level: 0  Pre Treatment Pain Screening  Intervention List: Patient able to continue with treatment  Social/Functional History (information provided by spouse, Ginny)  Social/Functional History  Lives With: Spouse  Type of Home: House  Home Layout: Two level, Bed/Bath upstairs  Home Access: Stairs to enter without rails  Entrance Stairs - Number of Steps: 1+1 STE  Bathroom Shower/Tub: Tourist information centre manager: Soil scientist: 3-in-1 commode, Furniture conservator/restorer Accessibility: Accessible, Environmental consultant accessible  Home Equipment: Rolling walker, Hospital bed  Receives Help From: Family  ADL Assistance: Independent  Homemaking Assistance: Needs assistance  Homemaking Responsibilities: No  Ambulation Assistance: Independent  Transfer Assistance: Teacher, English as a foreign language: Yes  Mode of Transportation: Truck  Occupation: Retired  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, Youth worker Work, Cardiac  Rehab  Additional Comments: May 2-prior to this date, patient Independent, 2 falls in the last 6 months (both recent)     Orientation  Orientation  Overall Orientation Status: Impaired  Orientation Level: Oriented to person  Objective   IADL History  Homemaking Responsibilities: No  Active Driver: Yes  Mode of Transportation: Truck  Occupation: Retired  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 Patient confused, has been agitated per chart, but able to participate in evaluation this date free of agitation. Patient tolerated sitting EOB x ~8 minutes, returning to supine for safety and to complete attends change   Tone RUE  RUE Tone: Not tested (Unable to assess fully, pt resistant to movement)  Tone LUE  LUE Tone: Not tested  Coordination  Movements Are Fluid And Coordinated: No  Coordination and Movement description: Fine motor impairments;Gross motor impairments;Decreased speed;Decreased accuracy;Right UE;Left UE (edema noted BUE)     Bed mobility  Rolling: Maximum assistance  Supine to Sit: Dependent/Total (modA of 2)  Sit to Supine: Dependent/Total (maxA of 2)  Transfers  Sit to stand: Unable to assess(comment)  Stand to sit: Unable to assess(comment)  Vision - Basic Assessment  Visual  History: Glaucoma;Cataracts;Corrective eye surgery  Patient Visual Report: No visual complaint reported.  Cognition  Overall Cognitive Status:  Impaired  Arousal/Alertness: Delayed responses to stimuli  Following Directions: Follows one step commands  Attention Span: Attends with cues to redirect  Following Commands: Follows one step commands with repetition (and increased time)  Safety Judgement: Decreased awareness of need for assistance  Sequencing and Organization: Assistance required to identify errors made;Assistance required to generate solutions;Assistance required to implement solutions  Perception  Overall Perceptual Status: Impaired  Initiation: Cues to initiate tasks  Motor Planning: Hand over hand to sequence tasks  Sensation  Overall Sensation Status:  (Patient unable to report)   Patient unable to participate in strength testing/range testing this date-limited by cognition     Assessment   Conditions Requiring Skilled Therapeutic Intervention : Decreased functional mobility ;Decreased ADL status;Decreased strength;Decreased safe awareness;Decreased cognition;Decreased endurance;Decreased high-level IADLs;Decreased balance  Treatment Diagnosis: Decreased ADLs, transfers, activity tolerance associated with fall, talus fx  Prognosis: Fair (due to cognitive status and NWB)  Patient Education: Evaluation, safety, sitting tolerance  Barriers to Learning: Cognition  Discharge Recommendations: Subacute/Skilled Nursing Facility;24 hour supervision or assist;Patient would benefit from continued therapy after discharge  Requires OT Follow Up: Yes  Timed Code Treatment Minutes: 30 Minutes  Total treatment time: 45 minutes  Activity Tolerance  Activity Tolerance: Patient limited by fatigue;Treatment limited secondary to decreased cognition  Safety Devices  Safety Devices in place: Yes  Type of devices: All fall risk precautions in place;Bed alarm in place;Call light within reach;Patient at risk for falls;Left in bed;Nurse notified  OT Equipment Recommendations  Equipment Needed: No        Discharge Recommendations:  [place], 24  hour supervision or assist, Patient would benefit from continued therapy after discharge     Plan   Plan  Times per week: 2-5x  Specific instructions for Next Treatment: simple ADLs, lateral transfer  Current Treatment Recommendations: Strengthening, Hotel manager, Engineer, production, Child psychotherapist, Proofreader, Scientist, clinical (histocompatibility and immunogenetics), Self-Care / ADL    G-Code  OT G-codes  Functional Limitation: Self care  Self Care Current Status [phone]): At least 80 percent but less than 100 percent impaired, limited or restricted  Self Care Goal Status (J6283): At least 40 percent but less than 60 percent impaired, limited or restricted    Daily Activity Six Clicks Form [facility] AM-PAC Score Conversion Table   How much help from another person does the patient currently need... Total  (Total/Dependent Assist) A Lot   (Max/Mod Assist) A Little    (Min/CGA/Supervision) None    (No human assistance) Raw Score Standardized Score CMS -100% Score CMS Modifier        6 17.07 100% CN   Putting on and taking off regular lower body clothing? [x] 1 [] 2   [] 3   [] 4  7 20.13 92.44% CM        8 22.86 85.69% CM   Bathing (including washing, rinsing, drying)? [x] 1 [] 2  [] 3   [] 4  9 25.33 79.59% CL        10 27.31 74.70% CL   Toileting, which includes using toilet, bedpan, or urinal?  [x] 1  [] 2   [] 3   [] 4  11 29.04 70.42% CL        12 30.60 66.57% CL   Putting on and taking off regular upper body clothing?  [x] 1  [] 2   [] 3   [] 4  13 32.03 63.03% CL  14 33.39 59.67% CK   Taking care of personal grooming such as brushing teeth?  [] 1  [x] 2   [] 3   [] 4  15 34.69 56.46% CK        16 35.96 53.32% CK   Eating Meals?  [x] 1  [] 2   [] 3   [] 4  17 37.26 50.11% CK        18 38.66 46.65% CK   Raw Score 7  19 40.22 42.80% CK     20 42.03 38.32% CJ   Standardized Score  20.13 21 44.27 32.79% CJ     22 47.10 25.80% CJ   CMS 0-100% Score  92.44% 23 51.12 15.86% CI     24 57.54 0.00% CH   CMS Modifier  CM       CH = 0% impaired  CI = 1-20% impaired  CJ = 20-40% impaired  CK = 40-60% impaired  CL = 60-80% impaired  CM = 80-100% impaired  CN = 100% impaired    Goals  Short term goals  Time Frame for Short term goals: Discharge  Short term goal 1: Patient will perform bed mobility modA of 1  Short term goal 2: Patient will tolerate sitting EOB supervision x 10-12 minutes to assist with ADL completion  Short term goal 3: Patient will perform simple grooming tasks minA  Short term goal 4: Patient will tolerate lateral, slide board transfer (due to NWB LLE) with maxA of 1  Long term goals  Time Frame for Long term goals : LTG=STG       Gust Brooms, OTR/L [phone]

## 2014-09-12 NOTE — Progress Notes (Signed)
Speech Language/Pathology  Dysphagia Treatment Note    Name:  Andrew [name]  DOB:   [birth year]  Medical Diagnosis:  Hypotension [I95.9]  Treatment Diagnosis: Oropharyngeal Dysphagia  Pain level:  Pt did not report pain    Current Diet Level: Per tx session in 09/11/14:  1.) NPO with the exception of meds crushed in puree, small sips of nectar thick liquids via tsp for pleasure  2) If respiratory status declines or s/s of aspiration/penetration are assessed recommend downgrade to strict NPO.     Tolerance of Recommended Diet Level:  Per RN report, Pt was able to tolerate some medication crushed in puree this morning with small amounts of nectar thick liquids    Assessment of Texture Tolerance:  Pt was positioned upright in bed, he would intermittently respond to questions, however minimal eye opening was noted.  Pt was provided with puree and nectar thick liquids.  Pt required verbal cues for improved oral phase initiation and to strip bolus from spoon, reduced oral manipulation and A-P propulsion was noted with suspected premature bolus loss to pharynx.  Delayed swallow initiation was noted with reduced laryngeal elevation for airway protection, Pt required intermittent verbal cues as occasional ABSENT swallow initiation was observed.  Intermittent use of multiple swallows was observed consistent with reduced pharyngeal clearing.  Delayed weak cough was noted x1 across various textures.  At this time the Pt continues to be at a high risk for aspiration based on delayed swallow initiation and suspected reduced airway protection, therefore continuation of NPO with the exception of medication crushed in puree and small amounts of NECTAR thick liquids via tsp for pleasure.      Plan:  Continued daily Dysphagia treatment with goals per plan of care.    Recommendations:  1.) Continue NPO with medication crushed in puree (as able to tolerate) and small sips of NECTAR thick liquids per tsp for pleasure  3)Continue recommended  oral motor/bolus control exercises, and DPNS treatments per clinical swallow eval Plan of Care.    Patient/Family Education:Education given to the Pt (no evidence of learning) and his wife, who verbalized understanding.    Discharge Recommendations:  SNF with Speech Therapy for Speech/Dysphagia treatment at discharge.    Timed Code Treatment:  0 minutes     Total Treatment Time:  18 minutes     Faythe Casa, M.A., [place]  Speech-Language Pathologist

## 2014-09-12 NOTE — Progress Notes (Signed)
Checked patient for incontinence.  Found to be clean and dry.  Attempted to reposition patient denies wanting repositioning.  Education provided.  Patient and family denies needs.  Call light in reach and bed alarm in use.Electronically signed by Dede Query, RN on 09/12/2014 at 10:21 PM

## 2014-09-12 NOTE — Progress Notes (Signed)
Report and bedside handoff completed with Jenn RN. Andrew [name] C Adlai Sinning .

## 2014-09-12 NOTE — Progress Notes (Addendum)
Department of Internal Medicine  General Internal Medicine   Progress Note      SUBJECTIVE: very weak , no po intake quite lethargic  Became febrile and hypotensive last night     History obtained from chart review and the patient  General ROS: positive for  - fatigue and malaise  negative for - chills, fever or night sweats  Psychological ROS: negative  Respiratory ROS: no cough, shortness of breath, or wheezing  Cardiovascular ROS: no chest pain or dyspnea on exertion  Gastrointestinal ROS: no abdominal pain, change in bowel habits, or black or bloody stools  Genito-Urinary ROS: no dysuria, trouble voiding, or hematuria  Musculoskeletal ROS: positive for - foot pain   negative for - joint swelling    OBJECTIVE      Medications      Current Facility-Administered Medications: darbepoetin alfa-polysorbate SOLN 300 mcg, 300 mcg, Subcutaneous, Once  cefTRIAXone (ROCEPHIN) IVPB 1 g, 1 g, Intravenous, Q24H  acetaminophen (TYLENOL) suppository 650 mg, 650 mg, Rectal, Q4H PRN  dextrose 5 % and 0.9 % sodium chloride infusion, , Intravenous, Once  haloperidol lactate (HALDOL) injection 2 mg, 2 mg, Intramuscular, Q6H PRN  metronidazole (FLAGYL) 500 mg in NaCl 100 mL IVPB premix, 500 mg, Intravenous, Q8H  lidocaine PF 1 % (PF) injection 5 mL, 5 mL, Intradermal, Once  sodium chloride flush 0.9 % injection 10 mL, 10 mL, Intravenous, 2 times per day  vancomycin (VANCOCIN) oral solution 250 mg, 250 mg, Oral, 3 times per day  carvedilol (COREG) tablet 3.125 mg, 3.125 mg, Oral, BID  vitamin D (CHOLECALCIFEROL) tablet 1,000 Units, 1,000 Units, Oral, Daily  citalopram (CELEXA) tablet 10 mg, 10 mg, Oral, Daily  ferrous sulfate tablet 325 mg, 325 mg, Oral, Daily with breakfast  magnesium oxide (MAG-OX) tablet 400 mg, 400 mg, Oral, BID  therapeutic multivitamin-minerals 1 tablet, 1 tablet, Oral, Daily  promethazine (PHENERGAN) tablet 12.5 mg, 12.5 mg, Oral, Q6H PRN  rifaximin (XIFAXAN) tablet 550 mg, 550 mg, Oral, BID  saccharomyces  boulardii (FLORASTOR) capsule 250 mg, 250 mg, Oral, BID  traMADol (ULTRAM) tablet 50 mg, 50 mg, Oral, Q12H PRN  vitamin B-12 (CYANOCOBALAMIN) tablet 50 mcg, 50 mcg, Oral, Daily    Physical      VITALS:    Visit Vitals   ??? BP 98/65   ??? Pulse 94   ??? Temp 98 ??F (36.7 ??C)   ??? Resp 16   ??? Ht 5\' 5"  (1.651 m)   ??? Wt 174 lb 12.8 oz (79.3 kg)   ??? SpO2 94%   ??? BMI 29.09 kg/m2     TEMPERATURE:  Current - Temp: 98 ??F (36.7 ??C); Max - Temp  Avg: 97.8 ??F (36.6 ??C)  Min: 97.7 ??F (36.5 ??C)  Max: 98 ??F (36.7 ??C)  RESPIRATIONS RANGE: Resp  Avg: 17.5  Min: 16  Max: 18  PULSE RANGE: Pulse  Avg: 95.6  Min: 90  Max: 106  BLOOD PRESSURE RANGE:  Systolic (07PXT), GGY:694 , Min:98 , WNI:627   ; Diastolic (03JKK), XFG:18, Min:65, Max:97    PULSE OXIMETRY RANGE: SpO2  Avg: 91.8 %  Min: 90 %  Max: 94 %  24HR INTAKE/OUTPUT:    No intake or output data in the 24 hours ending 09/13/14 0750  CONSTITUTIONAL:  fatigued, alert, uncooperative, distracted, mild distress and appears stated age  NECK:  Mild JVD   BACK:  wnl  LUNGS:  No increased work of breathing, good air exchange, clear to auscultation bilaterally, no crackles  or wheezing  CARDIOVASCULAR:  Normal apical impulse, regular rate and rhythm, normal S1 and S2, no S3 or S4, and no murmur noted  ABDOMEN:  Soft non tender BS +  MUSCULOSKELETAL:  Tenderness left foot  NEUROLOGIC:  No acute focal     Data      No results found for: PHART, PO2ART, PCO2ART, HCO3ART, BEART, O2SATART    Lab Results   Component Value Date    NA 154 09/12/2014    K 4.1 09/12/2014    CL 123 09/12/2014    CO2 23 09/12/2014    BUN 40 09/12/2014    CREATININE 1.1 09/12/2014    GLUCOSE 210 09/12/2014    CALCIUM 8.3 09/12/2014     Lab Results   Component Value Date    WBC 2.3 09/13/2014    WBC 2.6 08/30/2014    HGB 8.2 09/13/2014    HCT 25.4 09/13/2014    MCV 96.8 09/13/2014    PLT 46 09/13/2014         Lab Results   Component Value Date    INR 1.41 (H) 09/02/2014    PROTIME 16.2 (H) 09/02/2014       ASSESSMENT AND PLAN       Patient Active Problem List   Diagnosis   ??? Erectile dysfunction   ??? Thrombocytopenia ([place])   ??? Edema   ??? BPH (benign prostatic hyperplasia)   ??? Alpha-1-antitrypsin deficiency   ??? Cirrhosis, nonalcoholic ([place])   ??? Mild CAD   ??? Chronic systolic CHF (congestive heart failure) ([place])   ??? Non-ischemic cardiomyopathy ([place])   ??? Lumbar compression fracture ([place])   ??? Chronic low back pain   ??? Osteopenia   ??? Coagulopathy ([place])   ??? Hypotension   ??? Abnormal EKG   ??? Visual changes   ??? Fixed pupil of right eye   ??? Anemia   ??? Hemispheric carotid artery syndrome   ??? CAD in native artery   ??? Hyperlipidemia   ??? 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 Bronchiectasis without complication ([place])   ??? Benign essential HTN   ??? Hepatic encephalopathy (H[facility])   ??? Ataxia   ??? Pancytopenia ([place])   ??? DVT (deep venous thrombosis) ([place])   ??? 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 Pneumonia   ??? Traumatic hematoma of buttock   ??? Pneumonia of both lungs due to infectious organism   ??? Hematoma   ??? Closed displaced fracture of left talus      repeat CXR shows some  Radiographic CHF , Blood cultures negative ,Wife prefers PEG , I dont think  It is realistic and desirable , time has to come to offer some form of Nutrition, will also check ECHO  Long term outlook does not look bright , I wonder why wife would not be receptive to palliative care

## 2014-09-12 NOTE — Progress Notes (Signed)
RN referral for support.  Pt/wife familiar to me from CHF group.  Visited w/wife & son at bedside; talked of impact of pt's illness.  Support/prayer offered.

## 2014-09-12 NOTE — Progress Notes (Signed)
Peri care provided and incontinent brief changed. Oral care provided. Family at bedside. Denies any other needs at this time. Call light in reach.

## 2014-09-12 NOTE — Progress Notes (Signed)
Report and bedside handoff completed with Tam RN and Wendall Mola. Wells Guiles .

## 2014-09-13 ENCOUNTER — Inpatient Hospital Stay: Admit: 2014-09-13 | Primary: Internal Medicine

## 2014-09-13 LAB — CBC WITH AUTO DIFFERENTIAL
Bands Relative: 10 % — ABNORMAL HIGH (ref 0–7)
Basophils %: 0 %
Basophils Absolute: 0 10*3/uL (ref 0.0–0.2)
Eosinophils %: 1 %
Eosinophils Absolute: 0 10*3/uL (ref 0.0–0.6)
Hematocrit: 25.4 % — ABNORMAL LOW (ref 40.5–52.5)
Hemoglobin: 8.2 g/dL — ABNORMAL LOW (ref 13.5–17.5)
Lymphocytes %: 44 %
Lymphocytes Absolute: 1 10*3/uL (ref 1.0–5.1)
MCH: 31.4 pg (ref 26.0–34.0)
MCHC: 32.5 g/dL (ref 31.0–36.0)
MCV: 96.8 fL (ref 80.0–100.0)
MPV: 8.8 fL (ref 5.0–10.5)
Monocytes %: 3 %
Monocytes Absolute: 0.1 10*3/uL (ref 0.0–1.3)
Neutrophils %: 42 %
Neutrophils Absolute: 1.2 10*3/uL — ABNORMAL LOW (ref 1.7–7.7)
PLATELET SLIDE REVIEW: DECREASED
Platelets: 46 10*3/uL — ABNORMAL LOW (ref 135–450)
RBC: 2.62 M/uL — ABNORMAL LOW (ref 4.20–5.90)
RDW: 18 % — ABNORMAL HIGH (ref 12.4–15.4)
WBC: 2.3 10*3/uL — ABNORMAL LOW (ref 4.0–11.0)
nRBC: 4 /100 WBC — AB

## 2014-09-13 LAB — SODIUM: Sodium: 157 mmol/L — ABNORMAL HIGH (ref 136–145)

## 2014-09-13 LAB — BASIC METABOLIC PANEL
Anion Gap: 6 (ref 3–16)
BUN: 43 mg/dL — ABNORMAL HIGH (ref 7–20)
CO2: 25 mmol/L (ref 21–32)
Calcium: 8.7 mg/dL (ref 8.3–10.6)
Chloride: 127 mmol/L — ABNORMAL HIGH (ref 99–110)
Creatinine: 1.1 mg/dL (ref 0.8–1.3)
GFR African American: >60 (ref >60)
GFR Non-African American: >60 (ref >60)
Glucose: 122 mg/dL — ABNORMAL HIGH (ref 70–99)
Potassium: 4.1 mmol/L (ref 3.5–5.1)
Sodium: 158 mmol/L — ABNORMAL HIGH (ref 136–145)

## 2014-09-13 LAB — PHOSPHORUS: Phosphorus: 3.5 mg/dL (ref 2.5–4.9)

## 2014-09-13 MED ORDER — LIDOCAINE HCL 2 % EX GEL
2 % | CUTANEOUS | Status: DC | PRN
Start: 2014-09-13 — End: 2014-09-19

## 2014-09-13 MED ORDER — LIDOCAINE HCL (PF) 1 % IJ SOLN
1 % | Freq: Once | INTRAMUSCULAR | Status: DC
Start: 2014-09-13 — End: 2014-09-13

## 2014-09-13 MED ORDER — DARBEPOETIN ALFA 150 MCG/0.3ML IJ SOSY
150 MCG/0.3ML | Freq: Once | INTRAMUSCULAR | Status: AC
Start: 2014-09-13 — End: 2014-09-13
  Administered 2014-09-13: 18:00:00 300 ug via SUBCUTANEOUS

## 2014-09-13 MED ORDER — DARBEPOETIN ALFA 300 MCG/ML IJ SOLN
300 MCG/ML | Freq: Once | INTRAMUSCULAR | Status: DC
Start: 2014-09-13 — End: 2014-09-13

## 2014-09-13 MED ORDER — DEXTROSE 5 % IV SOLN
5 % | INTRAVENOUS | Status: DC
Start: 2014-09-13 — End: 2014-09-19
  Administered 2014-09-13: 17:00:00 via INTRAVENOUS

## 2014-09-13 MED ORDER — SODIUM CHLORIDE 0.9 % IV SOLN
0.9 % | INTRAVENOUS | Status: AC
Start: 2014-09-13 — End: 2014-09-13

## 2014-09-13 MED FILL — FLORASTOR 250 MG PO CAPS: 250 MG | ORAL | Qty: 1

## 2014-09-13 MED FILL — XIFAXAN 550 MG PO TABS: 550 MG | ORAL | Qty: 1

## 2014-09-13 MED FILL — ARANESP (ALBUMIN FREE) 150 MCG/0.3ML IJ SOSY: 150 MCG/0.3ML | INTRAMUSCULAR | Qty: 0.6

## 2014-09-13 MED FILL — HALOPERIDOL LACTATE 5 MG/ML IJ SOLN: 5 MG/ML | INTRAMUSCULAR | Qty: 1

## 2014-09-13 MED FILL — NORMAL SALINE FLUSH 0.9 % IV SOLN: 0.9 % | INTRAVENOUS | Qty: 10

## 2014-09-13 MED FILL — SODIUM CHLORIDE 0.9 % IV SOLN: 0.9 % | INTRAVENOUS | Qty: 250

## 2014-09-13 MED FILL — METRONIDAZOLE IN NACL 5-0.79 MG/ML-% IV SOLN: 5 mg/mL | INTRAVENOUS | Qty: 100

## 2014-09-13 MED FILL — DEXTROSE 5 % IV SOLN: 5 % | INTRAVENOUS | Qty: 1000

## 2014-09-13 MED FILL — THERAPEUTIC MULTIVIT/MINERAL PO TABS: ORAL | Qty: 1

## 2014-09-13 MED FILL — ARANESP (ALBUMIN FREE) 300 MCG/ML IJ SOLN: 300 MCG/ML | INTRAMUSCULAR | Qty: 1

## 2014-09-13 MED FILL — NORMAL SALINE FLUSH 0.9 % IV SOLN: 0.9 % | INTRAVENOUS | Qty: 20

## 2014-09-13 MED FILL — VITAMIN D3 25 MCG (1000 UT) PO TABS: 25 MCG (1000 UT) | ORAL | Qty: 1

## 2014-09-13 MED FILL — MAGNESIUM OXIDE 400 (241.3 MG) MG PO TABS: 400 (241.3 Mg) MG | ORAL | Qty: 1

## 2014-09-13 MED FILL — VITAMIN B-12 100 MCG PO TABS: 100 MCG | ORAL | Qty: 1

## 2014-09-13 MED FILL — VANCOMYCIN 50 MG/ML ORAL SOLUTION: 50 mg/mL | ORAL | Qty: 5

## 2014-09-13 MED FILL — LIDOCAINE HCL 2 % EX GEL: 2 % | CUTANEOUS | Qty: 5

## 2014-09-13 MED FILL — CITALOPRAM HYDROBROMIDE 20 MG PO TABS: 20 MG | ORAL | Qty: 1

## 2014-09-13 MED FILL — FERROUS SULFATE 325 (65 FE) MG PO TABS: 325 (65 Fe) MG | ORAL | Qty: 1

## 2014-09-13 MED FILL — CARVEDILOL 3.125 MG PO TABS: 3.125 MG | ORAL | Qty: 1

## 2014-09-13 NOTE — Progress Notes (Signed)
Spoke with Dr. Hassell Done about NG tube. MD aware of difficulty inserting tube, he will come for floor to place. Haldol given per MD orders.

## 2014-09-13 NOTE — Progress Notes (Signed)
Tube feed started at 20 ml/hr

## 2014-09-13 NOTE — Progress Notes (Signed)
Patient checked for incontinence.  Found to be clean and dry.  Patient refused repositioning.  Call light in reach and bed alarm in use.  Wife at bedside.Electronically signed by Dede Query, RN on 09/13/2014 at 6:31 AM

## 2014-09-13 NOTE — Progress Notes (Addendum)
Speech Language/Pathology  Dysphagia Treatment Note    Name:  Andrew [name]  DOB:   March 05, 1941  Medical Diagnosis:  Hypotension [I95.9]  Treatment Diagnosis: Oropharyngeal Dysphagia  Pain level:  Pt did not report pain    Current Diet Level: Per tx session in 09/11/14:  1.) NPO with the exception of meds crushed in puree, small sips of nectar thick liquids via tsp for pleasure  2) If respiratory status declines or s/s of aspiration/penetration are assessed recommend downgrade to strict NPO.     Tolerance of Recommended Diet Level:  Per wife pt required suctioning after attempting to take meds with puree last PM.     Assessment of Texture Tolerance: Pt was positioned upright in bed. NG tube to be placed this date. Oral mech exam revealed lingual surface to appear black and dry. Oral care was completed with wet toothette, pt was unable to volitionally swallow. Trials of nectar thick liquids and puree were provided. Pt demonstrated decreased oral manipulation with suspected premature bolus loss to the pharynx and delayed vs intermittently ABSENT swallow initiation.  Nectar thick liquids revealed episodic throat clear/weak cough reflex indicating suspected penetration/aspiration. Overall, pt demonstrates inconsistent ability to swallow small amounts of PO provided. He demonstrates a high risk for aspiration secondary to delayed and intermittently absent swallow onset, suspected decreased airway protection and weak throat clear/cough, therefore continuation of NPO with alternative means of nutrition/hydration with ongoing assessment of swallowing. Recommend frequent oral care.   Plan:  Continued daily Dysphagia treatment with goals per plan of care.    Recommendations:  1.) Continue NPO with alternative means of nutrition/hydration (NG to be placed this date). Recommend frequent oral care.   2) If unable to place NG recommend continue; medications crushed in puree, allow small amount of honey thick liquids for pleasure    3)Continue recommended oral motor/bolus control exercises, and DPNS treatments per clinical swallow eval Plan of Care.    Patient/Family Education:Education given to the Pt (no evidence of learning) and his wife, who verbalized understanding.    Discharge Recommendations:  SNF with Speech Therapy for Speech/Dysphagia treatment at discharge.    Timed Code Treatment:  0 minutes     Total Treatment Time:  18 minutes     Joaquim Lai, [place]  Speech Language Pathologist

## 2014-09-13 NOTE — Progress Notes (Signed)
Nutrition Assessment    Type and Reason for Visit: Reassess    Nutrition Recommendations:   1. Recommend EN to start.  2. Recommend 2 cal HN at goal rate 45 mL per hour to provide 1080 mL total volume, 2160 calories, 90 grams protein, 756 mL free water.   3. Defer water bolus to nephrology/cardiology  4. Ensure head of bed is 30 - 45 degrees during continuous gastric feeding. Turn off the feeding if head of bed is lowered less than 30 degrees.   Monitor for tolerance (residuals greater than 500 mL, bowel habits, N/V, cramping).      Malnutrition Assessment:  ?? Malnutrition Status: Meets the criteria for severe malnutrition  ?? Context: Acute illness or injury  ?? Findings of the 6 clinical characteristics of malnutrition (Minimum of 2 out of 6 clinical characteristics is required to make the diagnosis of moderate or severe Protein Calorie Malnutrition based on AND/ASPEN Guidelines):  1. Energy Intake-Less than or equal to 50%, greater than 7 days    2. Weight Loss-10% loss or greater,  (4 months)  3. Fat Loss-Unable to assess,    4. Muscle Loss-Unable to assess,    5. Fluid Accumulation-No significant fluid accumulation,    6. Grip Strength-Not measured    Nutrition Diagnosis:   ?? Problem: Inadequate oral intake  ?? Etiology: related to Insufficient energy/nutrient consumption, Difficulty swallowing    ??? Signs and symptoms:  as evidenced by NPO status due to medical condition, Diet history of poor intake, Intake 0-25%    Nutrition Assessment:  ?? Subjective Assessment: Pt remains NPO per SLP with teaspoons of nectar thick liquids for pleasure only. Pt now with 11 days no/poor PO intake. Pt evaluated for PEG tube by GI today d/t unclear etiology of OP dysphagia and questionable ascites. Pt to have NG placed for tube feeds. Recommend 2 cal HN (fluid restricted formula) d/t pt's hx of CHF and cirrhosis. Will defer water bolus to cardiology/nephrology. See above for recommendations.    ?? Current Nutrition  Therapies:  ?? Oral Diet Orders: NPO   ?? Oral Diet intake: NPO  ?? Oral Nutrition Supplement (ONS) Orders: None  ?? ONS intake: NPO  ?? Anthropometric Measures:  ?? Ht: 5\' 5"  (165.1 cm)   ?? Current Body Wt: 174 lb (78.9 kg)  ?? Usual Body Wt: 188 lb (85.3 kg) (per wife)  ?? Ideal Body Wt: 136 lb (61.7 kg)   ?? BMI Classification: BMI 25.0 - 29.9 Overweight  ?? Comparative Standards (Estimated Nutrition Needs):  ?? Estimated Daily Total Kcal: 5188-4166  ?? Estimated Daily Protein (g): 95-119 grams     Estimated Intake vs Estimated Needs: Intake Less Than Needs    Nutrition Risk Level: High    Nutrition Interventions:   Start Tube Feeding  Speech Therapy, Education Not Indicated, Continued Inpatient Monitoring    Nutrition Evaluation:   ?? Evaluation: Goals set   ?? Goals: Pt will tolerate EN at goal     ?? Monitoring: TF Intake, Tolerance to TF, Weight Status, Gastric Residuals, Comparative Standards, Pertinent Labs, Chewing/Swallowing    See Adult Nutrition Doc Flowsheet for more detail.     Electronically signed by Greer Ee, RD, LD on 09/13/14 at 2:23 PM    Contact Number: [phone]

## 2014-09-13 NOTE — Progress Notes (Signed)
Speech Language Pathology  Attempt  Andrew [name]   02/11/1941     Attempted to see pt to complete dysphagia follow-up. Pt leaving floor with transport at time of attempt. Will follow-up later this date as therapy schedule allows.     Thanks,  Joaquim Lai, [place]  Speech Language Pathologist

## 2014-09-13 NOTE — Progress Notes (Signed)
PICC nurse notified this RN that PICC placement is not possible due to low platelet count. PIV stick x2 attempted with no success. Current PIV leaking - removed per protocol. Page sent to Dr. Shearon Stalls. Pt appears comfortable with no distress. All meds given through NG tube. Bed alarm on, call light in reach. Routine VSS. Assessment completed and placed in flowsheets. Will continue to monitor.

## 2014-09-13 NOTE — Consults (Addendum)
Nephrology Consult Note  [id number]  [phone]   [email or web address]        Reason for Consult:  hypernatremia    HISTORY OF PRESENT ILLNESS:  History is provided by wife who is present at the bedside.              The patient is a 74 y.o. male with significant past medical history of MDS recently started on Vidaza, CHF, cirrhosis,  CAD, HTN who was admitted on 09/02/2014 s/p fall and left ankle fracture, weakness, diarrhea and AMS.  Per wife pt has been feeling weak, with diarrhea and decreased PO intake.  BP on arrival was low-given NS boluses with improvement.  He is started on vancomycin PO.  Lasix and eplerenone held due to diarrhea.   One day prior had SOB-CDR sowed pulmonary vascular congestion, given lasix.   UO not measured as incontinent.  Na 132 on admission, increased to 154 one day prior, 158 today.  Currently awake but not responsive to questions.      S/p lasix $RemoveB'40mg'zKKNxmOb$  yesterday  Past Medical History:        Diagnosis Date   ??? Allergic    ??? Alpha 1 antitrypsin deficiency    ??? Anemia    ??? Arthritis    ??? BPH (benign prostatic hyperplasia)    ??? CAD (coronary artery disease)      Non-obstructive   ??? Cancer ([place])    ??? Chronic systolic CHF (congestive heart failure) ([place]) 10/18/2013   ??? 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 Clostridium difficile diarrhea 09/04/2014   ??? Compression fracture of lumbar vertebra ([facility])      multiple, lumbar   ??? DVT (deep venous thrombosis) ([place])    ??? Glaucoma    ??? Hyperlipidemia    ??? Hypertension    ??? Kidney stones    ??? Leukopenia    ??? Liver disease    ??? 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 Pancytopenia ([place])    ??? Pneumonia    ??? Thrombocytopenia ([place]) 04/30/2009       Past Surgical History:        Procedure Laterality Date   ??? Av fistula repair  1970's   ??? Colonoscopy  4/12     5y   ??? Cardiac catheterization     ??? Eye surgery  04/12/2014     Laser Eye Surgery for glaucoma   ??? Cosmetic surgery  04/2013     eye lids   ??? Bone marrow biopsy      ??? Knee surgery         Current Medications:    No current facility-administered medications on file prior to encounter.      Current Outpatient Prescriptions on File Prior to Encounter   Medication Sig Dispense Refill   ??? saccharomyces boulardii (FLORASTOR) 250 MG capsule Take 250 mg by mouth 2 times daily     ??? citalopram (CELEXA) 10 MG tablet Take 1 tablet by mouth daily 30 tablet 3   ??? carvedilol (COREG) 6.25 MG tablet 3.125 mg in AM and 6.25 mg in PM 60 tablet 3   ??? eplerenone (INSPRA) 50 MG tablet Take 1 tablet by mouth daily 30 tablet 3   ??? magnesium oxide (MAG-OX) 400 (241.3 MG) MG TABS tablet TAKE ONE TABLET BY MOUTH TWICE DAILY 60 tablet 5   ??? traMADol (ULTRAM) 50 MG tablet Take 1 tablet by mouth every 12 hours  as needed for Pain 60 tablet 0   ??? lactulose 20 GM/30ML SOLN Take 30 mLs by mouth 2 times daily (Patient taking differently: Take 20 g by mouth 2 times daily ) 5400 mL 1   ??? Cholecalciferol (VITAMIN D3) 1000 UNITS CAPS Take 1 capsule by mouth daily      ??? MILK THISTLE PO Take 175 mg by mouth daily      ??? rifaximin (XIFAXAN) 550 MG tablet Take 550 mg by mouth 2 times daily.       ??? Multiple Vitamins-Minerals (CENTRUM SILVER PO) Take 1 tablet by mouth daily      ??? vitamin B-12 (CYANOCOBALAMIN) 100 MCG tablet Take 50 mcg by mouth daily     ??? promethazine (PHENERGAN) 25 MG tablet Take 0.5-1 tablets by mouth every 6 hours as needed for Nausea 30 tablet 0       Allergies:  Spironolactone    Social History:    Social History     Social History   ??? Marital status: Married     Spouse name: N/A   ??? Number of children: N/A   ??? Years of education: N/A     Occupational History   ??? Not on file.     Social History Main Topics   ??? Smoking status: Never Smoker   ??? Smokeless tobacco: Never Used      Comment: Passive smoke exposure: yes   ??? Alcohol use No   ??? Drug use: No   ??? Sexual activity: Not on file     Other Topics Concern   ??? Not on file     Social History Narrative       Family History:       Problem  Relation Age of Onset   ??? Diabetes Mother    ??? Hypertension Mother    ??? Diabetes Brother    ??? Diabetes Sister          REVIEW OF SYSTEMS:    Review of Systems - History obtained from spouse and chart review  General ROS: positive for  - fatigue, lethargy, weakness  negative for - fever  Hematological and Lymphatic ROS: positive for - bleeding problems, blood transfusions, bruising and pallor  Respiratory ROS: positive for - shortness of breath  negative for - hemoptysis or orthopnea  Cardiovascular ROS: positive for - edema and shortness of breath  negative for - chest pain  Gastrointestinal ROS: positive for - diarrhea  negative for - nausea/vomiting  Genito-Urinary ROS: no dysuria, trouble voiding, or hematuria  Musculoskeletal ROS: positive for - joint pain, joint swelling and muscular weakness  Rest ROS is reviewed and is negative per wife      PHYSICAL EXAM:    Vitals:    Visit Vitals   ??? BP 105/72   ??? Pulse 92   ??? Temp 97.2 ??F (36.2 ??C) (Axillary)   ??? Resp 16   ??? Ht $Remo'5\' 5"'UjzEb$  (1.651 m)   ??? Wt 174 lb 12.8 oz (79.3 kg)   ??? SpO2 96%   ??? BMI 29.09 kg/m2             Physical Exam:  Gen: ill appearing, + pallor, NAD  HEENT: dry mouth, trachea midline, anicteric  Neck: supple  CV: RRR no m/r/g.  No S3.  Lungs: diminished in bases, no resp distress  Abd: S/NT +BS  Ext: + edema, left ankle brace intact, no cyanosis  Skin: Warm.  Multiple BUE bruising  GU: No TTP  over bladder, nondistended.  Neuro: non-focal-slow to respond, no tremor  Joints: LLE brace intact    DATA:    CBC:   Lab Results   Component Value Date    WBC 2.3 09/13/2014    WBC 2.6 08/30/2014    RBC 2.62 09/13/2014    RBC 2.96 08/30/2014    HGB 8.2 09/13/2014    HCT 25.4 09/13/2014    MCV 96.8 09/13/2014    MCH 31.4 09/13/2014    MCHC 32.5 09/13/2014    RDW 18.0 09/13/2014    PLT 46 09/13/2014    MPV 8.8 09/13/2014     BMP:    Lab Results   Component Value Date    NA 154 09/12/2014    K 4.1 09/12/2014    CL 123 09/12/2014    CO2 23 09/12/2014    BUN 40  09/12/2014    LABALBU 1.8 09/12/2014    CREATININE 1.1 09/12/2014    CALCIUM 8.3 09/12/2014    GFRAA >60 09/12/2014    GFRAA >60 05/14/2011    LABGLOM >60 09/12/2014    GLUCOSE 210 09/12/2014       IMPRESSION/RECOMMENDATIONS:      Hypernatremia: water deficit ~5-6L-free water losses due to diarrhea/contrinuted by lasix.  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  I will continue to follow along.  Please call with questions.    Alba Cory, MD  09/13/2014

## 2014-09-13 NOTE — Progress Notes (Signed)
Pt returned from ultrasound.

## 2014-09-13 NOTE — Progress Notes (Signed)
Routine VSS.  Patient changed after incontinence of urine.  Pericare provided.  Patient repositioned.  Call light in reach and bed alarm in use.Electronically signed by Dede Query, RN on 09/13/2014 at 4:19 AM

## 2014-09-13 NOTE — Consults (Signed)
Gastroenterology Consult Note      Patient: Andrew [name]  DOB: September 27, 1940  Acct#:      Date:  09/13/2014    History of Present Illness  Patient is a 74 y.o. Caucasian male admitted with Hypotension [I95.9] who is seen in consult for PEG tube placement for OP dysphagia.     He has cirrhosis due to NASH and is followed by Dr. Einar Grad. Per Dr. 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 No h/o ascites. No h/o esophageal variceal bleeding. He has small esophageal varices seen on EGD 01/2014. He has h/o hepatic encephalopathy and takes Xifaxan BID. Also takes lactulose and has at least 3 BMs daily. He is on lasix and Inspra. He is no longer on aldactone due to gynecomastia. He had workup for anemia: EGD and colonoscopy in Nov 2015 for anemia. Small esophageal varices no gastric varies, non erosive gastritis. Normal colonoscopy. ??    We have seen pt multiple times this year for HE. 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 He was admitted on 6/27 after a fall and was noted to have left Talus fracture for which no intervention is needed. He was later dx with C.diff and has been on IV Flagyl and PO Vanco for this. He has been weak and somnolent. He was noted to have trouble swallowing. Speech evaluated pt and he failed his bedside exam. Recs were for NPO with alternative means of nutrition. His ammonia level has been normal. He is hypernatremic. He has been noted in the past to take a while to answer questions. He is now only able to answer some questions very slowly and follow commands slowly.       Past Medical History   Diagnosis Date   ??? Allergic    ??? Alpha 1 antitrypsin deficiency    ??? Anemia    ??? Arthritis    ??? BPH (benign prostatic hyperplasia)    ??? CAD (coronary artery disease)      Non-obstructive   ??? Cancer ([place])    ??? Chronic systolic CHF (congestive heart  failure) ([place]) 10/18/2013   ??? Cirrhosis, nonalcoholic ([place])      Due to Alpha-1 vs NASH   ??? Clostridium difficile diarrhea 09/04/2014   ??? Compression fracture of lumbar vertebra (HCC)      multiple, lumbar   ??? DVT (deep venous thrombosis) ([place])    ??? Glaucoma    ??? Hyperlipidemia    ??? Hypertension    ??? Kidney stones    ??? Leukopenia    ??? Liver disease    ??? 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 Pancytopenia ([place])    ??? Pneumonia    ??? Thrombocytopenia ([place]) 04/30/2009      Past Surgical History   Procedure Laterality Date   ??? Av fistula repair  1970's   ??? Colonoscopy  4/12     5y   ??? Cardiac catheterization     ??? Eye surgery  04/12/2014     Laser Eye Surgery for glaucoma   ??? Cosmetic surgery  04/2013     eye lids   ??? Bone marrow biopsy     ??? Knee surgery        Past Endoscopic History: see hpi     Admission Meds  No current facility-administered medications on file prior to encounter.  Current Outpatient Prescriptions on File Prior to Encounter   Medication Sig Dispense Refill   ??? saccharomyces boulardii (FLORASTOR) 250 MG capsule Take 250 mg by mouth 2 times daily     ??? citalopram (CELEXA) 10 MG tablet Take 1 tablet by mouth daily 30 tablet 3   ??? carvedilol (COREG) 6.25 MG tablet 3.125 mg in AM and 6.25 mg in PM 60 tablet 3   ??? eplerenone (INSPRA) 50 MG tablet Take 1 tablet by mouth daily 30 tablet 3   ??? magnesium oxide (MAG-OX) 400 (241.3 MG) MG TABS tablet TAKE ONE TABLET BY MOUTH TWICE DAILY 60 tablet 5   ??? traMADol (ULTRAM) 50 MG tablet Take 1 tablet by mouth every 12 hours as needed for Pain 60 tablet 0   ??? lactulose 20 GM/30ML SOLN Take 30 mLs by mouth 2 times daily (Patient taking differently: Take 20 g by mouth 2 times daily ) 5400 mL 1   ??? Cholecalciferol (VITAMIN D3) 1000 UNITS CAPS Take 1 capsule by mouth daily      ??? MILK THISTLE PO Take 175 mg by mouth daily      ??? rifaximin (XIFAXAN) 550 MG tablet Take 550 mg by mouth 2 times daily.       ??? Multiple Vitamins-Minerals (CENTRUM SILVER  PO) Take 1 tablet by mouth daily      ??? vitamin B-12 (CYANOCOBALAMIN) 100 MCG tablet Take 50 mcg by mouth daily     ??? promethazine (PHENERGAN) 25 MG tablet Take 0.5-1 tablets by mouth every 6 hours as needed for Nausea 30 tablet 0       Allergies  Allergies   Allergen Reactions   ??? Spironolactone Other (See Comments)     gynecomastia      Social   Social History   Substance Use Topics   ??? Smoking status: Never Smoker   ??? Smokeless tobacco: Never Used      Comment: Passive smoke exposure: yes   ??? Alcohol use No        Family History   Problem Relation Age of Onset   ??? Diabetes Mother    ??? Hypertension Mother    ??? Diabetes Brother    ??? Diabetes Sister       Review of Systems  Unable to obtain. pt unable to answer.        Physical Exam  Blood pressure 98/65, pulse 94, temperature 98 ??F (36.7 ??C), resp. rate 16, height $RemoveBe'5\' 5"'GUIUcYzTO$  (1.651 m), weight 174 lb 12.8 oz (79.3 kg), SpO2 94 %.    General appearance: awake, slow to respond verbally, speech slurred. cooperative, no distress, appears stated age  Eyes: Anicteric  Head: Normocephalic, without obvious abnormality  Lungs: clear to auscultation bilaterally, Normal Effort  Heart: regular rate and rhythm, normal S1 and S2, no murmurs or rubs  Abdomen: soft, non-tender. Bowel sounds normal. No masses,  no organomegaly.   Extremities: atraumatic, no cyanosis. BLE, BUE edema  Skin: warm and dry, no jaundice, ecchymoses on UE  Neuro: awake but is slow to respond. Follows commands but delayed. No asterixis.   Musculoskeletal: 5/5 grip strength BUE      Data Review:    Recent Labs      09/11/14   0623  09/12/14   0534  09/13/14   0529   WBC  1.6*  2.4*  2.3*   HGB  8.3*  8.4*  8.2*   HCT  25.0*  25.4*  25.4*   MCV  95.9  95.1  96.8   PLT  37*  50*  46*     Recent Labs      09/12/14   0533   NA  154*   K  4.1   CL  123*   CO2  23   BUN  40*   CREATININE  1.1     Recent Labs      09/12/14   0533   AST  47*   ALT  27   BILITOT  2.1*   ALKPHOS  98     No results for input(s): LIPASE,  AMYLASE in the last 72 hours.  No results for input(s): PROTIME, INR in the last 72 hours.  No results for input(s): PTT in the last 72 hours.  No results for input(s): OCCULTBLD in the last 72 hours.      Assessment:     Active Problems:    Chronic systolic CHF (congestive heart failure) (HCC)    Hypotension    Idiopathic cardiomyopathy (HCC)    Closed displaced fracture of left talus    Cirrhosis - secondary to NASH. Followed by Dr. Einar Grad. No signs of GI bleeding. No ascites on prior imaging. Ammonia level is normal 3 days ago.  C.diff - on IV Flagyl as well as Vanco but not tolerating PO. 2 BMs yesterday.   MDS   Hypernatremia - Na 154. Likely from dehydration from poor PO intake.   OP dysphagia - etiology unclear.  Mental status changes - Ammonia level normal and no asterixis on exam so doubt hepatic encephalopathy. Na is elevated which can account for some changes. However, he has had progressive changes over months, i.e. Delayed verbal responses, trouble holding utensils with PO intake. He also had a [facility] and new dx of oropharyngeal dysphagia.  Rec neuro consult to eval for a neurological disorder such as Parkinson's.     Recommendations:   - NG tube placement for TFs, meds, free water. Unclear that a PEG is indicated at this time as the cause of the OP dysphagia has not been identified and unclear if this is reversible. Also with thrombocytopenia and electrolyte abnormalities, unable to proceed. Will check [place] to see if any ascites as this is a CI to a PEG.  - correct hypernatremia  - neuro eval  - continue xifaxan for h/o HE  - continue IV flagyl for C.diff. PO Vanco can be given once has NG tube.     Discussed with Dr. Cottie Banda, PA-C  [place]  I have personally performed a face to face diagnostic evaluation on this patient.  I have interviewed and examined the patient and I agree with the findings and recommended plan of care.  In summary, my  findings and plan are the following: Oropharyngeal dysphagia in man with known Cirrhosis and recent C.diff diarrhea post talus fracture.  He has a declining mental status and slowing.  This may be from hepatic enceph, but would be concerned about other neurologic insult (eg. CVA, Parkinson's, etc.).  He is at high risk for PEG placement due to underlying cirrhosis (thrombocytopenia, ascites and abd varices).  Prefer a neuro eval to determine if other diseases present.  Will try NG tube placement for meds, fluids and nutrition.  Reconsider PEG early next week if neuro eval negative and labs improved.    Norwood Levo, MD  [place] and [place]  09/13/2014

## 2014-09-13 NOTE — Progress Notes (Signed)
Pt resting quietly in bed. Respirations even and unlabored. Water boluses programmed into pump. Bed alarm on, call light in reach. Will continue to monitor.

## 2014-09-13 NOTE — Progress Notes (Signed)
Bedside handoff completed with Clarene Critchley, RN. Foley catheter placed per orders. IV leaking, notified MD, PICC ordered. Left voicemail to notify PICC RN. Bed alarm engaged. Call light within reach. Will continue to monitor.

## 2014-09-13 NOTE — Progress Notes (Signed)
Called and discussed with Helene Kelp RN PICC order and need for access. States has IV access at this time. Patient's plt count remains too low to safely place bedside PICC as was discussed on 7/3 and 7/5. PLTs must be above 50,000 for safe bedside PICC placement per [facility] protocol set by administrative team.    Patient will also need nephrology clearance for PICC placement.     Please call PICC Team when these issues are resolved and consent form signed if PICC placement remains desired POC.

## 2014-09-13 NOTE — Progress Notes (Signed)
Bedside handoff completed with Danise Mina, RN. Pt restless. Wife at bedside. Bed alarm engaged. Call light within reach.

## 2014-09-13 NOTE — Progress Notes (Signed)
Physical Therapy/Occupational Therapy    Attempted to see pt for PT/OT tx session this date. Pt currently off of floor. Will follow up later this date as patient and therapist schedule allows.     Thank you,     Genene Churn PT, DPT [phone]  Georgia Lopes, [place], OTR/L, [phone]

## 2014-09-13 NOTE — Progress Notes (Signed)
Pt off floor for ultrasound.

## 2014-09-13 NOTE — Progress Notes (Signed)
Pt had large BM. Complete linen change provided. PRN haldol given for agitation.

## 2014-09-13 NOTE — Progress Notes (Addendum)
Department of Internal Medicine  General Internal Medicine   Progress Note      SUBJECTIVE: no change in general condition ,  Needed IM Haldol    History obtained from chart review and the patient  General ROS: positive for  - fatigue and malaise  negative for - chills, fever or night sweats  Psychological ROS: negative  Respiratory ROS: no cough, shortness of breath, or wheezing  Cardiovascular ROS: no chest pain or dyspnea on exertion  Gastrointestinal ROS: no abdominal pain, change in bowel habits, or black or bloody stools  Genito-Urinary ROS: no dysuria, trouble voiding, or hematuria  Musculoskeletal ROS: positive for - foot pain   negative for - joint swelling    OBJECTIVE      Medications      Current Facility-Administered Medications: dextrose 5 % solution, , Intravenous, Continuous  lidocaine (XYLOCAINE) 2 % jelly, , Topical, PRN  cefUROXime (CEFTIN) tablet 500 mg, 500 mg, Oral, 2 times per day  acetaminophen (TYLENOL) suppository 650 mg, 650 mg, Rectal, Q4H PRN  haloperidol lactate (HALDOL) injection 2 mg, 2 mg, Intramuscular, Q6H PRN  sodium chloride flush 0.9 % injection 10 mL, 10 mL, Intravenous, 2 times per day  vancomycin (VANCOCIN) oral solution 250 mg, 250 mg, Oral, 3 times per day  carvedilol (COREG) tablet 3.125 mg, 3.125 mg, Oral, BID  vitamin D (CHOLECALCIFEROL) tablet 1,000 Units, 1,000 Units, Oral, Daily  citalopram (CELEXA) tablet 10 mg, 10 mg, Oral, Daily  ferrous sulfate tablet 325 mg, 325 mg, Oral, Daily with breakfast  magnesium oxide (MAG-OX) tablet 400 mg, 400 mg, Oral, BID  therapeutic multivitamin-minerals 1 tablet, 1 tablet, Oral, Daily  promethazine (PHENERGAN) tablet 12.5 mg, 12.5 mg, Oral, Q6H PRN  rifaximin (XIFAXAN) tablet 550 mg, 550 mg, Oral, BID  saccharomyces boulardii (FLORASTOR) capsule 250 mg, 250 mg, Oral, BID  traMADol (ULTRAM) tablet 50 mg, 50 mg, Oral, Q12H PRN  vitamin B-12 (CYANOCOBALAMIN) tablet 50 mcg, 50 mcg, Oral, Daily    Physical      VITALS:    Visit Vitals    ??? BP 95/62   ??? Pulse 99   ??? Temp 96.9 ??F (36.1 ??C) (Axillary)   ??? Resp 18   ??? Ht 5\' 5"  (1.651 m)   ??? Wt 174 lb 12.8 oz (79.3 kg)   ??? SpO2 92%   ??? BMI 29.09 kg/m2     TEMPERATURE:  Current - Temp: 96.9 ??F (36.1 ??C); Max - Temp  Avg: 97.6 ??F (36.4 ??C)  Min: 96.9 ??F (36.1 ??C)  Max: 98.3 ??F (36.8 ??C)  RESPIRATIONS RANGE: Resp  Avg: 17.6  Min: 16  Max: 18  PULSE RANGE: Pulse  Avg: 101.1  Min: 92  Max: 115  BLOOD PRESSURE RANGE:  Systolic (83TDV), VOH:607 , Min:95 , PXT:062   ; Diastolic (69SWN), IOE:70, Min:58, Max:84    PULSE OXIMETRY RANGE: SpO2  Avg: 96 %  Min: 92 %  Max: 98 %  24HR INTAKE/OUTPUT:      Intake/Output Summary (Last 24 hours) at 09/14/14 0827  Last data filed at 09/14/14 0340   Gross per 24 hour   Intake    635 ml   Output      0 ml   Net    635 ml     CONSTITUTIONAL:  fatigued, alert, uncooperative, distracted, mild distress and appears stated age  NECK:  Mild JVD   BACK:  wnl  LUNGS:  No increased work of breathing, good air exchange, clear to  auscultation bilaterally, no crackles or wheezing  CARDIOVASCULAR:  Normal apical impulse, regular rate and rhythm, normal S1 and S2, no S3 or S4, and no murmur noted  ABDOMEN:  Soft non tender BS +  MUSCULOSKELETAL:  Tenderness left foot  NEUROLOGIC:  No acute focal     Data      No results found for: PHART, PO2ART, PCO2ART, HCO3ART, BEART, O2SATART    Lab Results   Component Value Date    NA 156 09/14/2014    K 3.9 09/14/2014    CL 123 09/14/2014    CO2 24 09/14/2014    BUN 51 09/14/2014    CREATININE 1.2 09/14/2014    GLUCOSE 202 09/14/2014    CALCIUM 8.8 09/14/2014     Lab Results   Component Value Date    WBC 2.3 09/13/2014    WBC 2.6 08/30/2014    HGB 7.7 09/14/2014    HCT 23.8 09/14/2014    MCV 97.2 09/14/2014    PLT 46 09/14/2014         Lab Results   Component Value Date    INR 1.41 (H) 09/02/2014    PROTIME 16.2 (H) 09/02/2014       ASSESSMENT AND PLAN      Patient Active Problem List   Diagnosis   ??? Erectile dysfunction   ??? Thrombocytopenia ([place])   ???  Edema   ??? BPH (benign prostatic hyperplasia)   ??? Alpha-1-antitrypsin deficiency   ??? Cirrhosis, nonalcoholic ([place])   ??? Mild CAD   ??? Chronic systolic CHF (congestive heart failure) ([place])   ??? Non-ischemic cardiomyopathy ([place])   ??? Lumbar compression fracture ([place])   ??? Chronic low back pain   ??? Osteopenia   ??? Coagulopathy ([place])   ??? Hypotension   ??? Abnormal EKG   ??? Visual changes   ??? Fixed pupil of right eye   ??? Anemia   ??? Hemispheric carotid artery syndrome   ??? CAD in native artery   ??? Hyperlipidemia   ??? Idiopathic cardiomyopathy ([place])   ??? Bronchiectasis without complication ([place])   ??? Benign essential HTN   ??? Hepatic encephalopathy (HCC)   ??? Ataxia   ??? Pancytopenia ([place])   ??? DVT (deep venous thrombosis) ([place])   ??? 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 Pneumonia   ??? Traumatic hematoma of buttock   ??? Pneumonia of both lungs due to infectious organism   ??? Hematoma   ??? Closed displaced fracture of left talus      check ECHO , Neuro consult , Nephro consult  GI consult , NG tube  Prognosis doubtful  Hemogram improved

## 2014-09-13 NOTE — Progress Notes (Signed)
Spoke with Dr. Shearon Stalls on phone regarding loss of IV access. Pt placed on water boluses TID and switched to PO antibiotics. Okay to leave IV access out.

## 2014-09-13 NOTE — Progress Notes (Signed)
Portable chest xray completed for NG tube placement verification.

## 2014-09-13 NOTE — Progress Notes (Signed)
Routine VSS.  Flagyl started.  Vanc held due to patient condition.  Patient checked for incontinence.  Clean and dry at this time.  Call light in reach and wife at bedside.  Bed alarm active.Electronically signed by Dede Query, RN on 09/13/2014 at 12:49 AM

## 2014-09-13 NOTE — Progress Notes (Signed)
Palliative Care:     Patient remains weak. Wife and patient met with GI today. Wife staying with patient 24/7. Met with her while patient was off unit for testing. She was often tearful stating that patient's illness has been like a roller coaster. States she has her faith to help her get through this and her adult children. States patient desires to get well. She wants to continue to "try to get him better" for now. Patient has been agitated and she has concerns if he would "pull out" the NG if it could be successfully inserted. Reviewed clinical status, goal of care.

## 2014-09-13 NOTE — Progress Notes (Signed)
Attempted NG tube insertion. L nostril used, pt bled from nose and mouth, tube withdrawn. Attempted insertion into R nostril, met resistance and nose started to bleed. Paged Dr. Hassell Done to notify.

## 2014-09-13 NOTE — Progress Notes (Signed)
Oncology and Hematology Care   Progress Note      09/13/2014 9:38 AM        Name: Andrew [name] .              Admitted: 09/02/2014    SUBJECTIVE:  Diuresed well after lasix yesterday.  Breathing is no longer labored.  He remains confused but seems to be improving.  Diarrhea resolved.  He denies pain.      Reviewed interval ancillary notes    Current Medications    darbepoetin alfa-polysorbate SOLN 300 mcg Once   cefTRIAXone (ROCEPHIN) IVPB 1 g Q24H   acetaminophen (TYLENOL) suppository 650 mg Q4H PRN   dextrose 5 % and 0.9 % sodium chloride infusion Once   haloperidol lactate (HALDOL) injection 2 mg Q6H PRN   metronidazole (FLAGYL) 500 mg in NaCl 100 mL IVPB premix Q8H   lidocaine PF 1 % (PF) injection 5 mL Once   sodium chloride flush 0.9 % injection 10 mL 2 times per day   vancomycin (VANCOCIN) oral solution 250 mg 3 times per day   carvedilol (COREG) tablet 3.125 mg BID   vitamin D (CHOLECALCIFEROL) tablet 1,000 Units Daily   citalopram (CELEXA) tablet 10 mg Daily   ferrous sulfate tablet 325 mg Daily with breakfast   magnesium oxide (MAG-OX) tablet 400 mg BID   therapeutic multivitamin-minerals 1 tablet Daily   promethazine (PHENERGAN) tablet 12.5 mg Q6H PRN   rifaximin (XIFAXAN) tablet 550 mg BID   saccharomyces boulardii (FLORASTOR) capsule 250 mg BID   traMADol (ULTRAM) tablet 50 mg Q12H PRN   vitamin B-12 (CYANOCOBALAMIN) tablet 50 mcg Daily       Objective:  Visit Vitals   ??? BP 98/65   ??? Pulse 94   ??? Temp 98 ??F (36.7 ??C)   ??? Resp 16   ??? Ht 5\' 5"  (1.651 m)   ??? Wt 174 lb 12.8 oz (79.3 kg)   ??? SpO2 94%   ??? BMI 29.09 kg/m2     No intake or output data in the 24 hours ending 09/13/14 0938   Wt Readings from Last 3 Encounters:   09/11/14 174 lb 12.8 oz (79.3 kg)   08/30/14 167 lb 9.6 oz (76 kg)   08/29/14 169 lb (76.7 kg)       General appearance:  Appears comfortable, sickly.  Lethargic.  Eyes: Sclera clear. Pupils equal.  ENT: Moist oral mucosa. Trachea midline, no adenopathy.  Cardiovascular: Regular rhythm,  normal S1, S2. No murmur. No edema in lower extremities. 2+ edema in upper extremities.  Respiratory: Not using accessory muscles.  Bibasilar rales.   GI: Abdomen soft, no tenderness, not distended, normal bowel sounds  Musculoskeletal: No cyanosis in digits, neck supple  Neurology: CN 2-12 grossly intact. No speech or motor deficits  Psych: confused.  Skin: Warm, dry, normal turgor    Labs and Tests:  CBC:   Recent Labs      09/11/14   0623  09/12/14   0534  09/13/14   0529   WBC  1.6*  2.4*  2.3*   HGB  8.3*  8.4*  8.2*   PLT  37*  50*  46*     BMP:    Recent Labs      09/12/14   0533   NA  154*   K  4.1   CL  123*   CO2  23   BUN  40*   CREATININE  1.1   GLUCOSE  210*     Hepatic:   Recent Labs      09/12/14   0533   AST  47*   ALT  27   BILITOT  2.1*   ALKPHOS  98       ASSESSMENT AND PLAN    Active Problems:    Chronic systolic CHF (congestive heart failure) (HCC)    Hypotension    Idiopathic cardiomyopathy (HCC)    Closed displaced fracture of left talus      MDS - s/p 1 cycle Vidaza.  Transfuse PRBC for hgb < 8 and plts < 20.  Will repeat Aranesp today.    Cdiff - diarrhea improved.  On flagyl and vanc.    Cirrhosis - 2/2 NASH    CHF - gave 40mg  IV lasix yesterday for decompensation.  Breathing improved today.    Acute resp failure - due to fluid overload/ CHF.  CXR reviewed.  Possible pneumonia?   Was started on empiric rocephin.    Hypernatremia - will consult Nephrology.    Metabolic encephalopathy - 2/2 infection and electrolyte derangements.    Dysphagia - unclear etiology.  GI has seen.  Considering PEG tube.    Discussed with pt's wife at the bedside.    [name], [place]  [facility]  Office: [phone]  Cell: [phone]

## 2014-09-13 NOTE — Procedures (Signed)
PATIENT NAME:                   PA #:             MR #Andrew [name], Andrew [name]                  [id number]        [id number]         ATTENDING PHYSICIAN:                    SERVICE DATE:  DIS DATE:       Antonieta Iba, MD                 09/13/2014                     DATE OF BIRTH:     AGE:            PATIENT TYPE:      RM #:           17-Dec-1940         73              IPF                [id number]               The EEG was performed on a 16-channel machine using the international 10-20  hookup.     The background rhythm of this recording consists of poorly developed and  moderately organized wave forms of 4-5 Hz frequency which are bilaterally  synchronous and symmetrical.  There was a lot of muscle and movement artifact  on this recording since the patient was quite restless throughout the  recording.  No focal abnormalities were seen.  Diffuse slowing was seen.  No  spikes or sharp waves were noted.  Hyperventilation was not performed.   Photic stimulation did not produce any change.  A very limited sleep  recording did not show any additional abnormalities.     IMPRESSION:  This is a limited electroencephalogram study because of the  patient's restlessness.  The interpretable part of this recording showed  diffuse slowing consistent with a diffuse encephalopathy.  Clinical  correlation is recommended.                                             Ivory Broad, MDe1     [id number]  DD: 09/13/2014 16:26   DT: 09/13/2014 20:00   Job #: [id number]  CC:

## 2014-09-13 NOTE — Progress Notes (Signed)
Patient found to be incontinent of urine and a small amount of stool.  Peri care provided and patient changed.  Patient repositioned for comfort.  Call light in reach and bed alarm in use.  Wife at bedside.Electronically signed by Dede Query, RN on 09/13/2014 at 2:21 AM

## 2014-09-13 NOTE — Telephone Encounter (Signed)
Dede Query at [facility] called reporting Absolute Segs 0.9 results. ePaged Provider.

## 2014-09-13 NOTE — Progress Notes (Signed)
NG tube placed after given Haldol.  NG fastened with Tegaderm and 60 cm mark at nares.  Air bolus confirmed gastric location.  Will get CXR/AXR to confirm placement since he is sedated.  Tube feeds and water boluses ordered.    Norwood Levo, MD  [place] and [place]  09/13/2014

## 2014-09-14 LAB — CREATININE, RANDOM URINE: Creatinine, Ur: 116.4 mg/dL (ref 39.0–259.0)

## 2014-09-14 LAB — CBC
Hematocrit: 23.8 % — ABNORMAL LOW (ref 40.5–52.5)
Hemoglobin: 7.7 g/dL — ABNORMAL LOW (ref 13.5–17.5)
MCH: 31.6 pg (ref 26.0–34.0)
MCHC: 32.5 g/dL (ref 31.0–36.0)
MCV: 97.2 fL (ref 80.0–100.0)
MPV: 9 fL (ref 5.0–10.5)
PLATELET SLIDE REVIEW: DECREASED
Platelets: 46 10*3/uL — ABNORMAL LOW (ref 135–450)
RBC: 2.45 M/uL — ABNORMAL LOW (ref 4.20–5.90)
RDW: 18.4 % — ABNORMAL HIGH (ref 12.4–15.4)
WBC: 2.3 10*3/uL — ABNORMAL LOW (ref 4.0–11.0)

## 2014-09-14 LAB — BASIC METABOLIC PANEL
Anion Gap: 9 (ref 3–16)
BUN: 51 mg/dL — ABNORMAL HIGH (ref 7–20)
CO2: 24 mmol/L (ref 21–32)
Calcium: 8.8 mg/dL (ref 8.3–10.6)
Chloride: 123 mmol/L — ABNORMAL HIGH (ref 99–110)
Creatinine: 1.2 mg/dL (ref 0.8–1.3)
GFR African American: >60 (ref >60)
GFR Non-African American: 60 — AB (ref >60)
Glucose: 202 mg/dL — ABNORMAL HIGH (ref 70–99)
Potassium: 3.9 mmol/L (ref 3.5–5.1)
Sodium: 156 mmol/L — ABNORMAL HIGH (ref 136–145)

## 2014-09-14 LAB — SODIUM, URINE, RANDOM: Sodium, Ur: <20 mmol/L

## 2014-09-14 LAB — CHLORIDE, URINE, RANDOM: Chloride: <20 mmol/L

## 2014-09-14 LAB — POCT GLUCOSE: POC Glucose: 198 mg/dl — ABNORMAL HIGH (ref 70–99)

## 2014-09-14 MED ORDER — CEFUROXIME AXETIL 250 MG PO TABS
250 MG | Freq: Two times a day (BID) | ORAL | Status: DC
Start: 2014-09-14 — End: 2014-09-19
  Administered 2014-09-14 – 2014-09-18 (×9): 500 mg via ORAL

## 2014-09-14 MED FILL — XIFAXAN 550 MG PO TABS: 550 MG | ORAL | Qty: 1

## 2014-09-14 MED FILL — FERROUS SULFATE 325 (65 FE) MG PO TABS: 325 (65 Fe) MG | ORAL | Qty: 1

## 2014-09-14 MED FILL — CEFUROXIME AXETIL 250 MG PO TABS: 250 MG | ORAL | Qty: 2

## 2014-09-14 MED FILL — MAGNESIUM OXIDE 400 (241.3 MG) MG PO TABS: 400 (241.3 Mg) MG | ORAL | Qty: 1

## 2014-09-14 MED FILL — VITAMIN B-12 100 MCG PO TABS: 100 MCG | ORAL | Qty: 1

## 2014-09-14 MED FILL — VANCOMYCIN 50 MG/ML ORAL SOLUTION: 50 mg/mL | ORAL | Qty: 5

## 2014-09-14 MED FILL — HALOPERIDOL LACTATE 5 MG/ML IJ SOLN: 5 MG/ML | INTRAMUSCULAR | Qty: 1

## 2014-09-14 MED FILL — METRONIDAZOLE IN NACL 5-0.79 MG/ML-% IV SOLN: 5 mg/mL | INTRAVENOUS | Qty: 100

## 2014-09-14 MED FILL — THERAPEUTIC MULTIVIT/MINERAL PO TABS: ORAL | Qty: 1

## 2014-09-14 MED FILL — VITAMIN D3 25 MCG (1000 UT) PO TABS: 25 MCG (1000 UT) | ORAL | Qty: 1

## 2014-09-14 MED FILL — CARVEDILOL 3.125 MG PO TABS: 3.125 MG | ORAL | Qty: 1

## 2014-09-14 MED FILL — FLORASTOR 250 MG PO CAPS: 250 MG | ORAL | Qty: 1

## 2014-09-14 MED FILL — NORMAL SALINE FLUSH 0.9 % IV SOLN: 0.9 % | INTRAVENOUS | Qty: 10

## 2014-09-14 MED FILL — CITALOPRAM HYDROBROMIDE 20 MG PO TABS: 20 MG | ORAL | Qty: 1

## 2014-09-14 NOTE — Progress Notes (Signed)
Pt w/small amount brown liquid stool.  Pericare performed, brief changed.  Pt repositioned for comfort.  Bed in low position, call light within reach, bed alarm engaged.  Will continue to monitor.

## 2014-09-14 NOTE — Progress Notes (Signed)
Oncology and Hematology Care   Progress Note    Subjective:  Wife states he's doing a little better. Pt is not orientated, keeps asking about his house.    Objective:  Medications:  ??? cefUROXime  500 mg Oral 2 times per day   ??? sodium chloride flush  10 mL Intravenous 2 times per day   ??? vancomycin  250 mg Oral 3 times per day   ??? carvedilol  3.125 mg Oral BID   ??? vitamin D  1,000 Units Oral Daily   ??? citalopram  10 mg Oral Daily   ??? ferrous sulfate  325 mg Oral Daily with breakfast   ??? magnesium oxide  400 mg Oral BID   ??? therapeutic multivitamin-minerals  1 tablet Oral Daily   ??? rifaximin  550 mg Oral BID   ??? saccharomyces boulardii  250 mg Oral BID   ??? vitamin B-12  50 mcg Oral Daily        Physical Exam:  Vitals:    Visit Vitals   ??? BP 103/68   ??? Pulse 93   ??? Temp 97.4 ??F (36.3 ??C) (Axillary)   ??? Resp 18   ??? Ht 5\' 5"  (1.651 m)   ??? Wt 174 lb 12.8 oz (79.3 kg)   ??? SpO2 97%   ??? BMI 29.09 kg/m2       General:  Alert, oriented to person only, NAD  HEENT: PERRL, + icterus  Resp: no resp distress, speaking in full sentences  Cardiovascular: RRR, no MRG  GI: + ecchymoses on BUE  Psych: ++ confusion, no agitation    Labs Results:    CBC:   Recent Labs      09/12/14   0534  09/13/14   0529  09/14/14   0627   WBC  2.4*  2.3*  2.3*   HGB  8.4*  8.2*  7.7*   HCT  25.4*  25.4*  23.8*   MCV  95.1  96.8  97.2   PLT  50*  46*  46*     BMP:   Recent Labs      09/12/14   0533  09/13/14   0529  09/13/14   1841  09/14/14   0627   NA  154*  158*  157*  156*   K  4.1  4.1   --   3.9   CL  123*  127*   --   123*   CO2  23  25   --   24   PHOS   --   3.5   --    --    BUN  40*  43*   --   51*   CREATININE  1.1  1.1   --   1.2     LIVER PROFILE:   Recent Labs      09/12/14   0533   AST  47*   ALT  27   BILITOT  2.1*   ALKPHOS  98     PT/INR:    Lab Results   Component Value Date    PROTIME 16.2 09/02/2014    PROTIME 15.2 08/01/2014    PROTIME 15.7 07/25/2014    INR 1.41 09/02/2014    INR 1.33 08/01/2014    INR 1.37 07/25/2014    INR  1.17 05/14/2010    INR 1.18 02/13/2010    INR 1.15 10/17/2009     PTT:    Lab Results   Component  Value Date    APTT 30.7 09/02/2014    APTT 33.5 08/01/2014    APTT 46.5 07/25/2014    APTT 29.7 10/17/2009       Investigations Reviewed:  NO new.      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  Transfuse for Hgb < 7, plts < 10.  ??  C Diff colitis - Flagyl + Vanco.  ??  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    Hypernatremia - Free water boluses  ??  I have discussed the above stated plan with the patient and they verbalized understanding and agreed with the plan. Thank you for allowing [place] to participate in this patients care.    Janus Molder, MD  Hematology/Oncology  [phone]

## 2014-09-14 NOTE — Progress Notes (Signed)
Bedside report completed with Almyra Free, RN. Electronically signed by Dutch Gray, RN on 09/14/2014 at 7:16 AM

## 2014-09-14 NOTE — Progress Notes (Signed)
Department of Internal Medicine  Nephrology Attending Progress Note        SUBJECTIVE:  We are following this patient for Hypernatremia . Patient progress reviewed.   Physical Exam:    VITALS:    Visit Vitals   ??? BP 105/71   ??? Pulse 88   ??? Temp 98.2 ??F (36.8 ??C) (Oral)   ??? Resp 18   ??? Ht 5\' 5"  (1.651 m)   ??? Wt 174 lb 12.8 oz (79.3 kg)   ??? SpO2 97%   ??? BMI 29.09 kg/m2     BLOOD PRESSURE RANGE:  Systolic (28UXL), KGM:010 , Min:95 , UVO:536   ; Diastolic (64QIH), KVQ:25, Min:58, Max:84    24HR INTAKE/OUTPUT:    Intake/Output Summary (Last 24 hours) at 09/14/14 1631  Last data filed at 09/14/14 1556   Gross per 24 hour   Intake    835 ml   Output      0 ml   Net    835 ml       Constitutional:  Drowsy   Pallor +   Cardiovascular/Edema:  Regular heart sounds , no murmur   Respiratory:  CTA  Gastrointestinal:  distended  Other:  Edema +     DATA:    CBC with Differential:    Lab Results   Component Value Date    WBC 2.3 09/14/2014    WBC 2.6 08/30/2014    RBC 2.45 09/14/2014    RBC 2.96 08/30/2014    HGB 7.7 09/14/2014    HCT 23.8 09/14/2014    PLT 46 09/14/2014    MCV 97.2 09/14/2014    MCH 31.6 09/14/2014    MCHC 32.5 09/14/2014    RDW 18.4 09/14/2014    NRBC 4 09/13/2014    SEGSPCT 42.6 05/14/2011    BANDSPCT 10 09/13/2014    METASPCT 4 09/02/2014    LYMPHOPCT 44.0 09/13/2014    LYMPHOPCT 41.9 08/30/2014    MONOPCT 3.0 09/13/2014    MYELOPCT 5 09/02/2014    EOSPCT 2.1 05/14/2010    BASOPCT 0.0 09/13/2014    MONOSABS 0.1 09/13/2014    LYMPHSABS 1.0 09/13/2014    EOSABS 0.0 09/13/2014    BASOSABS 0.0 09/13/2014    DIFFTYPE Scan-K 05/14/2011     CMP:    Lab Results   Component Value Date    NA 156 09/14/2014    K 3.9 09/14/2014    CL 123 09/14/2014    CO2 24 09/14/2014    BUN 51 09/14/2014    CREATININE 1.2 09/14/2014    GFRAA >60 09/14/2014    GFRAA >60 05/14/2011    AGRATIO 0.5 09/12/2014    LABGLOM 60 09/14/2014    GLUCOSE 202 09/14/2014    PROT 5.3 09/12/2014    PROT 6.0 05/14/2011    LABALBU 1.8 09/12/2014    CALCIUM  8.8 09/14/2014    BILITOT 2.1 09/12/2014    ALKPHOS 98 09/12/2014    AST 47 09/12/2014    ALT 27 09/12/2014     Uric Acid:  No components found for: URIC  U/A:    Lab Results   Component Value Date    NITRITE neg 10/18/2013    COLORU DK YELLOW 09/02/2014    COLORU yellow 10/18/2013    PHUR 5.0 09/02/2014    PHUR 5.0 10/18/2013    WBCUA 1 09/02/2014    RBCUA 10 09/02/2014    CLARITYU CLOUDY 09/02/2014    CLARITYU clear 10/18/2013    SPECGRAV  1.014 09/02/2014    SPECGRAV 1.010 10/18/2013    LEUKOCYTESUR Negative 09/02/2014    LEUKOCYTESUR neg 10/18/2013    UROBILINOGEN 1.0 09/02/2014    BILIRUBINUR Negative 09/02/2014    BILIRUBINUR neg 10/18/2013    BLOODU SMALL 09/02/2014    BLOODU neg 10/18/2013    GLUCOSEU Negative 09/02/2014    GLUCOSEU neg 10/18/2013         IMPRESSION/RECOMMENDATIONS:      Patient Active Problem List   Diagnosis   ??? Erectile dysfunction   ??? Thrombocytopenia ([place])   ??? Edema   ??? BPH (benign prostatic hyperplasia)   ??? Alpha-1-antitrypsin deficiency   ??? Cirrhosis, nonalcoholic ([place])   ??? Mild CAD   ??? Chronic systolic CHF (congestive heart failure) ([place])   ??? Non-ischemic cardiomyopathy ([place])   ??? Lumbar compression fracture ([place])   ??? Chronic low back pain   ??? Osteopenia   ??? Coagulopathy ([place])   ??? Hypotension   ??? Abnormal EKG   ??? Visual changes   ??? Fixed pupil of right eye   ??? Anemia   ??? Hemispheric carotid artery syndrome   ??? CAD in native artery   ??? Hyperlipidemia   ??? 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 Bronchiectasis without complication ([place])   ??? Benign essential HTN   ??? Hepatic encephalopathy ([facility])   ??? Ataxia   ??? Pancytopenia ([place])   ??? DVT (deep venous thrombosis) ([place])   ??? Myelodysplasia (myelodysplastic syndrome) ([place])   ??? Pneumonia   ??? Traumatic hematoma of buttock   ??? Pneumonia of both lungs due to infectious organism   ??? Hematoma   ??? Closed displaced fracture of left talus        Diagnosis and Plan :   1. Hypernatremia   2. Myelodysplasia   3. Non ischemic cardiomyopathy

## 2014-09-14 NOTE — Plan of Care (Signed)
Problem: Risk for Impaired Skin Integrity  Goal: Tissue integrity - skin and mucous membranes  Structural intactness and normal physiological function of skin and  mucous membranes.   RN will assess skin every shift  RN will encourage pt to change positions every 2 hours

## 2014-09-14 NOTE — Progress Notes (Signed)
Speech Language Pathology  Attempt    Attempted to see pt for dysphagia follow-up.  Per discussion with wife/RN, pt's LOA is still decreased at this time.  SLP will return later this PM to assess if alertness has improved.    Rockie Neighbours Jennings [phone]  Speech Language Pathologist

## 2014-09-14 NOTE — Plan of Care (Signed)
Problem: Falls - Risk of  Goal: Absence of falls  Patient Remains free of falls or injury this shift. Fall risk assessment conducted QS & PRN. Fall prevention initiated. Visual rounding will be completed to verify pt is safe & free of falls. Bed will be in lowest position & patient and/or family will be educated on using call light to prevent falls. Fall risk indicators, including safe door sign, wrist band, and orange blanket will be used. RN will utilize necessary equipment when moving a patient to prevent falls. Bed in lowest position & wheels locked. Bed alarm activated. Bedside table, call light and phone within reach.                 Problem: Risk for Impaired Skin Integrity  Goal: Tissue integrity - skin and mucous membranes  Structural intactness and normal physiological function of skin and  mucous membranes.   Skin assessed QS. Encouraged patient to turn and reposition Q2H, assisting if needed to maintain skin integrity.         Problem: Pain Control  Goal: Maintain pain level at or below patient???s acceptable level (or 5 if patient is unable to determine acceptable level)  Patient will maintain pain at a tolerable level. Pain assessed QS & PRN. Patient knowledgeable of pain scale, medication available and need to communicate with staff. Patient will be knowledgeable of side effects of pain medications.

## 2014-09-14 NOTE — Progress Notes (Signed)
Report and bedside handoff complete with Julie Huth, RN

## 2014-09-14 NOTE — Progress Notes (Signed)
Tube feeds increased to 30 per order. No residual, pt tolerating well. Will continue to monitor.

## 2014-09-14 NOTE — Plan of Care (Signed)
Problem: Pain Control  Goal: Maintain pain level at or below patient???s acceptable level (or 5 if patient is unable to determine acceptable level)  RN will assess pain level every shift and PRN  RN will re-assess pain level 30-60 minutes after intervention

## 2014-09-14 NOTE — Progress Notes (Addendum)
Speech Language/Pathology  Dysphagia Treatment Note    Name:  Andrew [name]  DOB:   1940-11-16  Medical Diagnosis:  Hypotension [I95.9]  Treatment Diagnosis: Oropharyngeal Dysphagia  Pain level:  Pt did not report pain    Current Diet Level: NPO with nutrition via NG    Tolerance of Recommended Diet Level:  Pt not taking PO at this time.    Assessment of Texture Tolerance: Pt was repositioned upright in bed. Per wife, pt received Haldol for NG placement yesterday and LOA has been decreased since.  However, pt has become slightly more alert this PM and was requesting something to drink.  Pt's eyes open, but inconsistent ability to respond to questions or follow commands.  Observed with limited trial of nectar thick liquid via tsp.  Pt demonstrated poor bolus acceptance and retrieval of liquids from spoon with anterior loss of liquids.  Decreased bolus manipulation and anterior-posterior transit of bolus with premature loss into pharynx.  Noted ABSENT SWALLOW initiation with all trials.  Attempted deep pressure to tongue to facilitate reflexive swallow but unable to elicit.  Pt able to cough on command and pt's oral cavity was suctioned. Due to continued ABSENT swallow, recommend to continue NPO at this time.    Plan:  Continued daily Dysphagia treatment with goals per plan of care.    Recommendations:  1.) Continue NPO with alternative means of nutrition/hydration. Recommend frequent oral care.   2)Continue recommended oral motor/bolus control exercises, and DPNS treatments per clinical swallow eval Plan of Care.    Patient/Family Education:Education given to the Pt (no evidence of learning) and his wife, who verbalized understanding.    Discharge Recommendations:  SNF with Speech Therapy for Speech/Dysphagia treatment at discharge.    Timed Code Treatment:  0 minutes     Total Treatment Time:  10 minutes     Rockie Neighbours MS CCC-SLP [phone]  Speech Language Pathologist

## 2014-09-14 NOTE — Progress Notes (Signed)
Pt resting quietly in bed. Respirations even and unlabored. Bed alarm on, call light in reach. Will continue to monitor.

## 2014-09-14 NOTE — Progress Notes (Signed)
VSS see flow sheet. Assess complete. See flow sheet. Discussed POC for this shift with spouse. HS medications administered per order. See emar. NG flushes without difficulty and placement verified. Complete bed bath given. Gown, brief and all bed linens changed. LUE weeping. Wrapped with abd pads. All extremities elevated on pillows. HOB at 30 degrees. Patient is lethargic at this time. Will monitor. Family at bedside. Bedside table, phone and call light within reach. All fall precautions in place.

## 2014-09-14 NOTE — Progress Notes (Signed)
Shift assessment complete.  Pt in bed w/eyes closed.  Respirations easy and unlabored.  No s/s distress noted.  Pt's spouse at bedside.  Tube feeding infusing at goal rate of 49ml/hr.  Pt tolerating well.  Foley draining amber urine.  Pt with 2+ edema to BUE and 3+ to BLE.  Bed in low position, call light within reach, bed alarm engaged, will continue to monitor.

## 2014-09-14 NOTE — Plan of Care (Signed)
Problem: Falls - Risk of  Intervention: Fall risk assessment  RN will assess fall risk every shift.  Intervention: Fall precautions  SAFE, blanket, bracelet in place.  Bed alarm engaged  Intervention: Toileting assistance  Pt will call for assistance OOB

## 2014-09-14 NOTE — Progress Notes (Signed)
Routine VSS. Bed alarm on, call light in reach. No apparent signs of distress. Will continue to monitor.

## 2014-09-14 NOTE — Plan of Care (Signed)
Problem: Falls - Risk of  Goal: Absence of falls  Outcome: Ongoing    Problem: Risk for Impaired Skin Integrity  Goal: Tissue integrity - skin and mucous membranes  Structural intactness and normal physiological function of skin and  mucous membranes.   Outcome: Ongoing    Problem: Nutrition Deficit:  Goal: Ability to achieve adequate nutritional intake will improve  Ability to achieve adequate nutritional intake will improve   Outcome: Ongoing

## 2014-09-14 NOTE — Progress Notes (Signed)
Progress Note - Dr. Cathie Hoops - Internal Medicine  PCP: Debbe Bales, DO [address] / [place] [address] [phone]    Hospital Day: 12  Code Status: Full Code  Current Diet: DIET TUBE FEED CONTINUOUS/CYCLIC NPO; Fluid Restricted; Nasogastric; Continuous; 20; 45; 24; Exceptions are: Other (See Comment)        CC: follow up on medical issues    Subjective:   Andrew [name] is a 74 y.o. male.    He denies problems    Doing well  No iv access  Getting feeds/fluids thru ng    He denies chest pain, denies shortness of breath, denies nausea,  denies emesis.    10 system Review of Systems is reviewed with patient, and pertinent positives are listed here: None . Otherwise, Review of systems is negative.     I have reviewed the patient's medical and social history in detail and updated the computerized patient record.  To recap: He  has a past medical history of Allergic; Alpha 1 antitrypsin deficiency; Anemia; Arthritis; BPH (benign prostatic hyperplasia); CAD (coronary artery disease); Cancer (HCC); Chronic systolic CHF (congestive heart failure) (HCC); Cirrhosis, nonalcoholic (HCC); Clostridium difficile diarrhea; Compression fracture of lumbar vertebra (HCC); DVT (deep venous thrombosis) (HCC); Glaucoma; Hyperlipidemia; Hypertension; Kidney stones; Leukopenia; Liver disease; Myelodysplasia (myelodysplastic syndrome) (HCC); Pancytopenia (HCC); Pneumonia; and Thrombocytopenia (HCC).. He  has a past surgical history that includes AV fistula repair (1970's); Colonoscopy (4/12); Cardiac catheterization; eye surgery (04/12/2014); Cosmetic surgery (04/2013); bone marrow biopsy; and knee surgery.[name] He  reports that he has never smoked. He has never used smokeless tobacco. He reports that he does not drink alcohol or use illicit drugs..        Active Hospital Problems    Diagnosis Date Noted   ??? Closed displaced fracture of left talus [S92.102A] 09/03/2014   ??? Myelodysplasia (myelodysplastic syndrome) (HCC) [D46.9] 07/19/2014    ??? Idiopathic cardiomyopathy (HCC) [I42.8] 05/20/2014   ??? Hyperlipidemia [E78.5]    ??? Hypotension [I95.9] 04/01/2014   ??? Chronic systolic CHF (congestive heart failure) (HCC) [I50.22] 10/18/2013       Current Facility-Administered Medications: dextrose 5 % solution, , Intravenous, Continuous  lidocaine (XYLOCAINE) 2 % jelly, , Topical, PRN  cefUROXime (CEFTIN) tablet 500 mg, 500 mg, Oral, 2 times per day  acetaminophen (TYLENOL) suppository 650 mg, 650 mg, Rectal, Q4H PRN  haloperidol lactate (HALDOL) injection 2 mg, 2 mg, Intramuscular, Q6H PRN  sodium chloride flush 0.9 % injection 10 mL, 10 mL, Intravenous, 2 times per day  vancomycin (VANCOCIN) oral solution 250 mg, 250 mg, Oral, 3 times per day  carvedilol (COREG) tablet 3.125 mg, 3.125 mg, Oral, BID  vitamin D (CHOLECALCIFEROL) tablet 1,000 Units, 1,000 Units, Oral, Daily  citalopram (CELEXA) tablet 10 mg, 10 mg, Oral, Daily  ferrous sulfate tablet 325 mg, 325 mg, Oral, Daily with breakfast  magnesium oxide (MAG-OX) tablet 400 mg, 400 mg, Oral, BID  therapeutic multivitamin-minerals 1 tablet, 1 tablet, Oral, Daily  promethazine (PHENERGAN) tablet 12.5 mg, 12.5 mg, Oral, Q6H PRN  rifaximin (XIFAXAN) tablet 550 mg, 550 mg, Oral, BID  saccharomyces boulardii (FLORASTOR) capsule 250 mg, 250 mg, Oral, BID  traMADol (ULTRAM) tablet 50 mg, 50 mg, Oral, Q12H PRN  vitamin B-12 (CYANOCOBALAMIN) tablet 50 mcg, 50 mcg, Oral, Daily         Objective:  Visit Vitals   ??? BP 103/68   ??? Pulse 93   ??? Temp 97.4 ??F (36.3 ??C) (Axillary)   ??? Resp  18   ??? Ht 5\' 5"  (1.651 m)   ??? Wt 174 lb 12.8 oz (79.3 kg)   ??? SpO2 97%   ??? BMI 29.09 kg/m2        Patient Vitals for the past 24 hrs:   BP Temp Temp src Pulse Resp SpO2   09/14/14 1129 - - - 93 - -   09/14/14 11/15/14 - - - 84 - -   09/14/14 0915 103/68 97.4 ??F (36.3 ??C) Axillary 84 18 97 %   09/14/14 0843 - - - - 18 91 %   09/14/14 0800 - - - 111 - -   09/14/14 0429 95/62 96.9 ??F (36.1 ??C) Axillary 99 18 92 %   09/14/14 0012 100/58 98.3 ??F  (36.8 ??C) Axillary 93 18 96 %   09/13/14 2120 - - - 115 - -   09/13/14 2057 115/84 97.8 ??F (36.6 ??C) Axillary 112 18 98 %   09/13/14 1735 102/67 97.7 ??F (36.5 ??C) - 102 18 98 %     Patient Vitals for the past 96 hrs (Last 3 readings):   Weight   09/11/14 1145 174 lb 12.8 oz (79.3 kg)           Intake/Output Summary (Last 24 hours) at 09/14/14 1428  Last data filed at 09/14/14 0340   Gross per 24 hour   Intake    635 ml   Output      0 ml   Net    635 ml         Physical Exam:   S1, S2 normal, no murmur, rub or gallop, regular rate and rhythm  clear to auscultation bilaterally  abdomen is soft without significant tenderness, masses, organomegaly or guarding  extremities normal, atraumatic, no cyanosis or edema    Labs:  Lab Results   Component Value Date    WBC 2.3 (L) 09/14/2014    HGB 7.7 (L) 09/14/2014    HCT 23.8 (L) 09/14/2014    PLT 46 (L) 09/14/2014    CHOL 101 04/02/2014    TRIG 83 04/02/2014    HDL 22 (L) 04/02/2014    ALT 27 09/12/2014    AST 47 (H) 09/12/2014    NA 156 (H) 09/14/2014    K 3.9 09/14/2014    CL 123 (H) 09/14/2014    CREATININE 1.2 09/14/2014    BUN 51 (H) 09/14/2014    CO2 24 09/14/2014    TSH 2.15 08/15/2013    PSA 0.55 08/15/2013    INR 1.41 (H) 09/02/2014    LABA1C 5.0 01/10/2014    LABMICR YES 09/02/2014     Lab Results   Component Value Date    CKTOTAL 33 (L) 09/02/2014    TROPONINI 0.02 (H) 09/02/2014       Recent Imaging Results are Reviewed:  Xr Ankle Left Standard    Result Date: 09/02/2014  EXAMINATION: 3 VIEWS OF THE LEFT ANKLE  09/02/2014 5:34 pm  COMPARISON: None.  HISTORY: ORDERING SYSTEM PROVIDED HISTORY: injury TECHNOLOGIST PROVIDED HISTORY: Ordering Physician Provided Reason for Exam: patient from [facility] brought in by 09/04/2014. was using his walker with wife as stand by and reportedly got weak and fell. per wife his responsiveness has been altered. Acuity: Unknown Type of Exam: Initial  FINDINGS: There is an acute mildly displaced fracture along the dorsal margin  of the distal talus.  No acute fracture demonstrated elsewhere and no dislocation.     Acute fracture of the talus.  Ct Head Wo Contrast    Result Date: 09/02/2014  EXAMINATION: CT OF THE HEAD WITHOUT CONTRAST  09/02/2014 5:43 pm  TECHNIQUE: CT of the head was performed without the administration of intravenous contrast. Dose modulation, iterative reconstruction, and/or weight based adjustment of the mA/kV was utilized to reduce the radiation dose to as low as reasonably achievable.  COMPARISON: CT brain without contrast 07/07/2014  HISTORY: ORDERING SYSTEM PROVIDED HISTORY: HEAD TRAUMA, CLOSED, MILD, ABN NEURO EXAM AND/OR RISK FACTORS TECHNOLOGIST PROVIDED HISTORY: If patient is on cardiac monitor and/or pulse ox, they may be taken off cardiac monitor and pulse ox, left on O2 if currently on. All monitors reattached when patient returns to room. Ordering Physician Provided Reason for Exam: ams  FINDINGS: BRAIN/VENTRICLES: There is no acute intracranial hemorrhage, mass effect or midline shift. No abnormal extra-axial fluid collection.  The gray-white differentiation is maintained without evidence of an acute infarct. There is continued mild prominence of the ventricles and sulci due to global parenchymal volume loss.  There are nonspecific areas of hypoattenuation within the periventricular and subcortical white matter, which likely represent chronic microvascular ischemic change.  ORBITS: The visualized portion of the orbits demonstrate no acute abnormality.  SINUSES: The visualized paranasal sinuses and mastoid air cells demonstrate no acute abnormality.  SOFT TISSUES/SKULL: No acute abnormality of the visualized skull or soft tissues.     No acute intracranial abnormality.  Mild cerebral atrophy unchanged.     US Gallbladder Ruq    Result Date: 09/13/2014  EXAMINATION: RIGHT UPPER QUADRANT ULTRASOUND  09/13/2014 10:25 am  COMPARISON: CT abdomen and pelvis, 07/30/2014  HISTORY: ORDERING SYSTEM PROVIDED HISTORY:  TECHNOLOGIST PROVIDED HISTORY: Ordering Physician Provided Reason for Exam: pain Acuity: Unknown Type of Exam: Unknown  FINDINGS: LIVER:  The liver is small with undulating margin.  Parenchyma is mildly inhomogeneous.  No mass is identified.  BILIARY SYSTEM:  Gallbladder is unremarkable without evidence of pericholecystic fluid, wall thickening or stones.  Negative sonographic Murphy's sign.  Common bile duct is within normal limits measuring 6 mm.  RIGHT KIDNEY: The right kidney is grossly unremarkable without evidence of hydronephrosis. It measures 8.5 x 5.2 x 6.3 cm  PANCREAS:  Visualized portions of the pancreas are unremarkable.  OTHER: A right basilar pleural effusion is noted.     No cholelithiasis is identified.  The common duct is at the upper limits of normal in size.  Stable, cirrhotic morphology of the liver.  Right basal pleural effusion.     Xr Chest Portable    Result Date: 09/13/2014  EXAMINATION: SINGLE VIEW OF THE CHEST  09/13/2014 6:06 pm  COMPARISON: None.  HISTORY: ORDERING SYSTEM PROVIDED HISTORY: Check NG tube placement TECHNOLOGIST PROVIDED HISTORY: Low chest/high abdomen Ordering Physician Provided Reason for Exam: Check NG tube placement Acuity: Unknown Type of Exam: Unknown  FINDINGS: NG tube tip within the stomach.  Proximal port is at the GE junction which may predispose to gastroesophageal reflux.  Recommend advancement by 5-10 cm.  Bilateral pulmonary edema is present similar to prior examination.  Bilateral pleural effusions.     Heart and lungs appears similar.  NG tube tip within the stomach.  Consider advancement.     Xr Chest Portable    Result Date: 09/11/2014  EXAMINATION: SINGLE VIEW OF THE CHEST  09/11/2014 9:08 pm  COMPARISON: 09/02/2014  HISTORY: ORDERING SYSTEM PROVIDED HISTORY: Elevated temperature TECHNOLOGIST PROVIDED HISTORY: Ordering Physician Provided Reason for Exam: Elevated temperature Acuity: Acute Type of Exam: Unknown  FINDINGS: The  heart is borderline enlarged but  similar to the prior exam.  There is ill definition of the pulmonary vascularity.  There are bilateral symmetric perihilar and bibasilar infiltrates with small pleural effusions.     Findings are most consistent with development of moderate CHF.  Multifocal pneumonia cannot be excluded.     Xr Chest Portable    Result Date: 09/02/2014  EXAMINATION: SINGLE VIEW OF THE CHEST  09/02/2014 5:34 pm  COMPARISON: Chest x-ray Aug 04, 2014  HISTORY: ORDERING SYSTEM PROVIDED HISTORY: SOB TECHNOLOGIST PROVIDED HISTORY: Ordering Physician Provided Reason for Exam: patient [facility] brought in by [employer]. was using his walker with wife as stand by and reportedly got weak and fell. per wife his responsiveness has been altered. Acuity: Unknown Type of Exam: Initial  FINDINGS: There is a moderate-sized infiltrate of the right lung base.  No acute pulmonary process demonstrated elsewhere.  No definitive portable chest x-ray evidence of pleural effusion.  No pneumothorax demonstrated.  Cardiac and mediastinal shadows appear stable     Moderate amount of right basilar atelectasis versus pneumonia.  This is very similar to the comparison.       Assessment and Plan:  Patient Active Hospital Problem List:   Idiopathic cardiomyopathy ([place]) (05/20/2014)    Assessment: Stable    Plan: Continue present orders/plan   Chronic systolic CHF (congestive heart failure) ([place]) (10/18/2013)    Assessment: Stable    Plan: Continue present orders/plan   Hypotension (04/01/2014)    Assessment: bp better    Plan: Continue present orders/plan   Myelodysplasia (myelodysplastic syndrome) ([place]) (07/19/2014)    Assessment: plt stable    Plan: Continue present orders/plan   Closed displaced fracture of left talus (09/03/2014)              Deniece Portela  09/14/2014

## 2014-09-14 NOTE — Progress Notes (Signed)
Bedside handoff to Rosanna RN

## 2014-09-14 NOTE — Consults (Signed)
NEUROLOGY CONSULTATION     Patient's Name :   Andrew [name]        Date of birth:   01-Oct-1940                    Date of Consultation : 09/14/2014 1:02 PM    Referring Physician :  Peggye Pitt, MD    Reason for Consultation :     Mental status changes    HISTORY OF PRESENT ILLNESS :    Andrew [name] is a 74 y.o. male   History was obtained from  The patient's wife at the bedside and dictations in the chart  Patient was admitted with hypotension and mental status changes.   Patient has known cirrhosis and liver disease.  Patient also has known myelodysplastic syndrome.  Patient has been poorly responsive for a number of days and the last 2 or 3 days seemed to be confused.  Patient is not able to give any history.  I was asked to see him for a neurological evaluation.  Patient had a CT of the head at the time of admission which was normal.  EEG showed diffuse slowing.      REVIEW OF SYSTEMS      Unable to obtain history from the patient because of his neurological status   Past Medical History   Diagnosis Date   ??? Allergic    ??? Alpha 1 antitrypsin deficiency    ??? Anemia    ??? Arthritis    ??? BPH (benign prostatic hyperplasia)    ??? CAD (coronary artery disease)      Non-obstructive   ??? Cancer ([place])    ??? Chronic systolic CHF (congestive heart failure) ([place]) 10/18/2013   ??? 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 Clostridium difficile diarrhea 09/04/2014   ??? Compression fracture of lumbar vertebra (HCC)      multiple, lumbar   ??? DVT (deep venous thrombosis) ([place])    ??? Glaucoma    ??? Hyperlipidemia    ??? Hypertension    ??? Kidney stones    ??? Leukopenia    ??? Liver disease    ??? Myelodysplasia (myelodysplastic syndrome) ([place]) 07/19/2014   ??? Pancytopenia ([place])    ??? Pneumonia    ??? Thrombocytopenia ([place]) 04/30/2009     Family History   Problem Relation Age of Onset   ??? Diabetes Mother    ??? Hypertension Mother    ??? Diabetes Brother    ??? Diabetes  Sister      Social History     Social History   ??? Marital status: Married     Spouse name: N/A   ??? Number of children: N/A   ??? Years of education: N/A     Social History Main Topics   ??? Smoking status: Never Smoker   ??? Smokeless tobacco: Never Used      Comment: Passive smoke exposure: yes   ??? Alcohol use No   ??? Drug use: No   ??? Sexual activity: Not Asked     Other Topics Concern   ??? None     Social History Narrative     Current Facility-Administered Medications   Medication Dose Route Frequency Provider Last Rate Last Dose   ??? dextrose 5 % solution   Intravenous Continuous Mukti Patel-Chamberlin, MD 100 mL/hr at 09/13/14 1455     ??? lidocaine (XYLOCAINE) 2 % jelly   Topical PRN Annie Main  Vilma Prader, MD       ??? cefUROXime (CEFTIN) tablet 500 mg  500 mg Oral 2 times per day Peggye Pitt, MD   500 mg at 09/14/14 0943   ??? acetaminophen (TYLENOL) suppository 650 mg  650 mg Rectal Q4H PRN Peggye Pitt, MD   650 mg at 09/11/14 2031   ??? haloperidol lactate (HALDOL) injection 2 mg  2 mg Intramuscular Q6H PRN Peggye Pitt, MD   2 mg at 09/13/14 2329   ??? sodium chloride flush 0.9 % injection 10 mL  10 mL Intravenous 2 times per day Peggye Pitt, MD   10 mL at 09/13/14 1254   ??? vancomycin (VANCOCIN) oral solution 250 mg  250 mg Oral 3 times per day Peggye Pitt, MD   250 mg at 09/14/14 0948   ??? carvedilol (COREG) tablet 3.125 mg  3.125 mg Oral BID Peggye Pitt, MD   3.125 mg at 09/14/14 0945   ??? vitamin D (CHOLECALCIFEROL) tablet 1,000 Units  1,000 Units Oral Daily Peggye Pitt, MD   1,000 Units at 09/14/14 0945   ??? citalopram (CELEXA) tablet 10 mg  10 mg Oral Daily Peggye Pitt, MD   10 mg at 09/14/14 0945   ??? ferrous sulfate tablet 325 mg  325 mg Oral Daily with breakfast Peggye Pitt, MD   325 mg at 09/14/14 0945   ??? magnesium oxide (MAG-OX) tablet 400 mg  400 mg Oral BID Peggye Pitt, MD   400 mg at 09/14/14 0944   ??? therapeutic multivitamin-minerals 1 tablet  1 tablet Oral Daily Peggye Pitt,  MD   1 tablet at 09/14/14 0944   ??? promethazine (PHENERGAN) tablet 12.5 mg  12.5 mg Oral Q6H PRN Peggye Pitt, MD       ??? rifaximin Doreene Nest) tablet 550 mg  550 mg Oral BID Peggye Pitt, MD   550 mg at 09/14/14 0944   ??? saccharomyces boulardii (FLORASTOR) capsule 250 mg  250 mg Oral BID Peggye Pitt, MD   250 mg at 09/14/14 0944   ??? traMADol (ULTRAM) tablet 50 mg  50 mg Oral Q12H PRN Peggye Pitt, MD   50 mg at 09/06/14 2234   ??? vitamin B-12 (CYANOCOBALAMIN) tablet 50 mcg  50 mcg Oral Daily Peggye Pitt, MD   50 mcg at 09/14/14 0944     Allergies   Allergen Reactions   ??? Spironolactone Other (See Comments)     gynecomastia       PHYSICAL EXAMINATION:  Visit Vitals   ??? BP 105/71   ??? Pulse 88   ??? Temp 98.2 ??F (36.8 ??C) (Oral)   ??? Resp 18   ??? Ht 5\' 5"  (1.651 m)   ??? Wt 174 lb 12.8 oz (79.3 kg)   ??? SpO2 97%   ??? BMI 29.09 kg/m2     Appearance:  Ill-appearing and lethargic and in no distress  Mental Status Exam:  Patient is very lethargic.  He would occasionally open his eyes and try to mumble a few words and go back to sleep.  He is not able to cooperate for detailed mental status examination.  Funduscopic Exam:   Unable to perform due to poor cooperation  Cranial Nerves:   II: Visual fields:   Could not be tested  III: Pupils:   Equal round and reactive to light  III,IV,VI:  Oculocephalic reflexes are intact  V: Facial sensation  Could  not be tested  VII: face appears symmetrical   VIII: Hearing:  Could not be tested  IX:  Gag reflex is present  XI:  Shoulder strength could not be assessed  XII:  Tongue is in the midline  Motor:   Patient has significant generalized weakness with his strength is 2+ and 3 over 5.  Tone is diminished.  Sensory:   Cannot cooperate for sensory testing  Coordination:   Could not be tested  Reflexes:   Barely elicitable all over  Plantar response:  Flexor response bilaterally  Gait:  Unable to ambulate  Romberg:  Could not be tested  Vascular: No carotid bruit  bilaterally    DATA:  LABS:  General Labs:    CBC:   Lab Results   Component Value Date    WBC 2.3 09/14/2014    WBC 2.6 08/30/2014    RBC 2.45 09/14/2014    RBC 2.96 08/30/2014    HGB 7.7 09/14/2014    HCT 23.8 09/14/2014    MCV 97.2 09/14/2014    MCH 31.6 09/14/2014    MCHC 32.5 09/14/2014    RDW 18.4 09/14/2014    PLT 46 09/14/2014    MPV 9.0 09/14/2014     BMP:    Lab Results   Component Value Date    NA 156 09/14/2014    K 3.9 09/14/2014    CL 123 09/14/2014    CO2 24 09/14/2014    BUN 51 09/14/2014    LABALBU 1.8 09/12/2014    CREATININE 1.2 09/14/2014    CALCIUM 8.8 09/14/2014    GFRAA >60 09/14/2014    GFRAA >60 05/14/2011    LABGLOM 60 09/14/2014    GLUCOSE 202 09/14/2014     RADIOLOGY REVIEW:  I have reviewed radiology image(s) and reports(s) of:   CT scan of the head    IMPRESSION :  Severe encephalopathy, metabolic, probably related to severe persistent hypernatremia   CT head did not show any acute abnormalities  Patient Active Problem List   Diagnosis   ??? Erectile dysfunction   ??? Thrombocytopenia ([place])   ??? Edema   ??? BPH (benign prostatic hyperplasia)   ??? Alpha-1-antitrypsin deficiency   ??? Cirrhosis, nonalcoholic ([place])   ??? Mild CAD   ??? Chronic systolic CHF (congestive heart failure) ([place])   ??? Non-ischemic cardiomyopathy ([place])   ??? Lumbar compression fracture ([place])   ??? Chronic low back pain   ??? Osteopenia   ??? Coagulopathy ([place])   ??? Hypotension   ??? Abnormal EKG   ??? Visual changes   ??? Fixed pupil of right eye   ??? Anemia   ??? Hemispheric carotid artery syndrome   ??? CAD in native artery   ??? Hyperlipidemia   ??? Idiopathic cardiomyopathy ([place])   ??? Bronchiectasis without complication ([place])   ??? Benign essential HTN   ??? Hepatic encephalopathy (HCC)   ??? Ataxia   ??? Pancytopenia ([place])   ??? DVT (deep venous thrombosis) ([place])   ??? Myelodysplasia (myelodysplastic syndrome) ([place])   ??? Pneumonia   ??? Traumatic hematoma of buttock   ??? Pneumonia of both lungs due to infectious organism   ??? Hematoma   ??? Closed displaced fracture  of left talus     RECOMMENDATIONS :  Lengthy discussion with patient's wife at the bedside  Explained CT head and EEG findings  Would recommend correction of hyper-natremia  Prognosis is guarded at this time  Thank you for this consultation  Please note a portion of this chart was generated using dragon dictation software. Although every effort was made to ensure the accuracy of this automated transcription, some errors in transcription may have occurred.

## 2014-09-14 NOTE — Progress Notes (Signed)
[facility]  Gastroenterology Progress Note  Andrew [name] is a 74 y.o. male patient.  1. Closed displaced fracture of left talus, unspecified portion of talus, initial encounter    2. Hypotension, unspecified hypotension type    3. Elevated troponin    4. Cirrhosis, nonalcoholic ([place])    5. Myelodysplasia (myelodysplastic syndrome) (HCC)        SUBJECTIVE:  Pt sleeping.  Tolerating TF well.     Physical    VITALS:    Visit Vitals   ??? BP 103/68   ??? Pulse 84   ??? Temp 97.4 ??F (36.3 ??C) (Axillary)   ??? Resp 18   ??? Ht 5\' 5"  (1.651 m)   ??? Wt 174 lb 12.8 oz (79.3 kg)   ??? SpO2 97%   ??? BMI 29.09 kg/m2     TEMPERATURE:  Current - Temp: 97.4 ??F (36.3 ??C); Max - Temp  Avg: 97.6 ??F (36.4 ??C)  Min: 96.9 ??F (36.1 ??C)  Max: 98.3 ??F (36.8 ??C)    Abdomen soft, ND, NT, no HSM, Bowel sounds normal   Somnolent     Data      Recent Labs      09/12/14   0534  09/13/14   0529  09/14/14   0627   WBC  2.4*  2.3*  2.3*   HGB  8.4*  8.2*  7.7*   HCT  25.4*  25.4*  23.8*   MCV  95.1  96.8  97.2   PLT  50*  46*  46*     Recent Labs      09/12/14   0533  09/13/14   0529  09/13/14   1841  09/14/14   0627   NA  154*  158*  157*  156*   K  4.1  4.1   --   3.9   CL  123*  127*   --   123*   CO2  23  25   --   24   PHOS   --   3.5   --    --    BUN  40*  43*   --   51*   CREATININE  1.1  1.1   --   1.2     Recent Labs      09/12/14   0533   AST  47*   ALT  27   BILITOT  2.1*   ALKPHOS  98     No results for input(s): LIPASE, AMYLASE in the last 72 hours.          ASSESSMENT   1. 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 C. Diff.  On abx but still with diarrhea  3. Op dysphagia-  Neuro w/u in process      PLAN    1. Cont TF while getting neuro w/u  2. Cont abx  3. Correct hypernatremia   4. Would not place a PEG at this point until Na corrected and other causes of MS changes r/o.     Burnell Blanks, MD  [place] [facility] [place]

## 2014-09-15 LAB — CBC
Hematocrit: 23.6 % — ABNORMAL LOW (ref 40.5–52.5)
Hemoglobin: 7.3 g/dL — ABNORMAL LOW (ref 13.5–17.5)
MCH: 31.3 pg (ref 26.0–34.0)
MCHC: 31 g/dL (ref 31.0–36.0)
MCV: 100.9 fL — ABNORMAL HIGH (ref 80.0–100.0)
MPV: 9.7 fL (ref 5.0–10.5)
PLATELET SLIDE REVIEW: DECREASED
Platelets: 43 10*3/uL — ABNORMAL LOW (ref 135–450)
RBC: 2.34 M/uL — ABNORMAL LOW (ref 4.20–5.90)
RDW: 19.5 % — ABNORMAL HIGH (ref 12.4–15.4)
WBC: 2.2 10*3/uL — ABNORMAL LOW (ref 4.0–11.0)

## 2014-09-15 LAB — BASIC METABOLIC PANEL
Anion Gap: 5 (ref 3–16)
BUN: 47 mg/dL — ABNORMAL HIGH (ref 7–20)
CO2: 26 mmol/L (ref 21–32)
Calcium: 8.6 mg/dL (ref 8.3–10.6)
Chloride: 126 mmol/L — ABNORMAL HIGH (ref 99–110)
Creatinine: 1.1 mg/dL (ref 0.8–1.3)
GFR African American: >60 (ref >60)
GFR Non-African American: >60 (ref >60)
Glucose: 196 mg/dL — ABNORMAL HIGH (ref 70–99)
Potassium: 4.2 mmol/L (ref 3.5–5.1)
Sodium: 157 mmol/L — ABNORMAL HIGH (ref 136–145)

## 2014-09-15 MED ORDER — IBUPROFEN 400 MG PO TABS
400 MG | Freq: Four times a day (QID) | ORAL | Status: DC | PRN
Start: 2014-09-15 — End: 2014-09-19

## 2014-09-15 MED ORDER — ACETAMINOPHEN 160 MG/5ML PO SOLN
160 MG/5ML | ORAL | Status: DC | PRN
Start: 2014-09-15 — End: 2014-09-19
  Administered 2014-09-15 – 2014-09-17 (×2): 650 mg via ORAL

## 2014-09-15 MED FILL — CEFUROXIME AXETIL 250 MG PO TABS: 250 MG | ORAL | Qty: 2

## 2014-09-15 MED FILL — THERAPEUTIC MULTIVIT/MINERAL PO TABS: ORAL | Qty: 1

## 2014-09-15 MED FILL — VANCOMYCIN 50 MG/ML ORAL SOLUTION: 50 mg/mL | ORAL | Qty: 5

## 2014-09-15 MED FILL — ACETAMINOPHEN 160 MG/5ML PO SOLN: 160 MG/5ML | ORAL | Qty: 20.3

## 2014-09-15 MED FILL — VITAMIN B-12 100 MCG PO TABS: 100 MCG | ORAL | Qty: 1

## 2014-09-15 MED FILL — FERROUS SULFATE 325 (65 FE) MG PO TABS: 325 (65 Fe) MG | ORAL | Qty: 1

## 2014-09-15 MED FILL — MAGNESIUM OXIDE 400 (241.3 MG) MG PO TABS: 400 (241.3 Mg) MG | ORAL | Qty: 1

## 2014-09-15 MED FILL — XIFAXAN 550 MG PO TABS: 550 MG | ORAL | Qty: 1

## 2014-09-15 MED FILL — NORMAL SALINE FLUSH 0.9 % IV SOLN: 0.9 % | INTRAVENOUS | Qty: 10

## 2014-09-15 MED FILL — FLORASTOR 250 MG PO CAPS: 250 MG | ORAL | Qty: 1

## 2014-09-15 MED FILL — CARVEDILOL 3.125 MG PO TABS: 3.125 MG | ORAL | Qty: 1

## 2014-09-15 MED FILL — ACETAMINOPHEN 650 MG RE SUPP: 650 MG | RECTAL | Qty: 1

## 2014-09-15 MED FILL — VITAMIN D3 25 MCG (1000 UT) PO TABS: 25 MCG (1000 UT) | ORAL | Qty: 1

## 2014-09-15 MED FILL — CITALOPRAM HYDROBROMIDE 20 MG PO TABS: 20 MG | ORAL | Qty: 1

## 2014-09-15 NOTE — Plan of Care (Addendum)
Problem: Falls - Risk of  Goal: Absence of falls  Patient Remains free of falls or injury this shift. Fall risk assessment conducted QS & PRN. Fall prevention initiated. Visual rounding will be completed to verify pt is safe & free of falls. Bed will be in lowest position & patient and/or family will be educated on using call light to prevent falls. Fall risk indicators, including safe door sign, wrist band, and orange blanket will be used. RN will utilize necessary equipment when moving a patient to prevent falls. Bed in lowest position & wheels locked. Bed alarm activated. Bedside table, call light and phone within reach.                 Problem: Risk for Impaired Skin Integrity  Goal: Tissue integrity - skin and mucous membranes  Structural intactness and normal physiological function of skin and  mucous membranes.   Skin assessed QS. Encouraged patient to turn and reposition Q2H, assisting if needed to maintain skin integrity.         Problem: Pain Control  Goal: Maintain pain level at or below patient???s acceptable level (or 5 if patient is unable to determine acceptable level)  Patient will maintain pain at a tolerable level. Pain assessed QS & PRN. Patient knowledgeable of pain scale, medication available and need to communicate with staff. Patient will be knowledgeable of side effects of pain medications.

## 2014-09-15 NOTE — Progress Notes (Signed)
VSS see flow sheet. Assess complete. See flow sheet. Discussed POC for this shift with spouse. HS medications administered per order. See emar. NG flushes without difficulty and placement verified. Patient incontinent of medium soft brown stool. Brief changed and peri care given. BUE extremities elevated on pillows. HOB at 30 degrees. Patient is alert at this time. Will monitor. Family at bedside. Bedside table, phone and call light within reach. All fall precautions in place

## 2014-09-15 NOTE — Progress Notes (Signed)
Report and bedside handoff complete with Desiree Burrow, RN

## 2014-09-15 NOTE — Plan of Care (Signed)
Problem: Risk for Impaired Skin Integrity  Goal: Tissue integrity - skin and mucous membranes  Structural intactness and normal physiological function of skin and  mucous membranes.   Outcome: Ongoing  Pt turned and repositioned every 2 hrs.Leanora Cover, RN

## 2014-09-15 NOTE — Progress Notes (Signed)
Code status changed to [facility].

## 2014-09-15 NOTE — Progress Notes (Signed)
Progress Note - Dr. Tasia Catchings - Internal Medicine  PCP: Alecia Lemming, DO [place] / [place] [address] [facility] Day: 13  Code Status: Full Code  Current Diet: DIET TUBE FEED CONTINUOUS/CYCLIC NPO; Fluid Restricted; Nasogastric; Continuous; 20; 45; 24; Exceptions are: Other (See Comment)        CC: follow up on medical issues    Subjective:   Andrew [name] is a 74 y.o. male.    He denies problems    Unchanged  Pt resting comfortably  No new issues    He denies chest pain, denies shortness of breath, denies nausea,  denies emesis.    10 system Review of Systems is reviewed with patient, and pertinent positives are listed here: None . Otherwise, Review of systems is negative.     I have reviewed the patient's medical and social history in detail and updated the computerized patient record.  To recap: He  has a past medical history of Allergic; Alpha 1 antitrypsin deficiency; Anemia; Arthritis; BPH (benign prostatic hyperplasia); CAD (coronary artery disease); Cancer ([place]); Chronic systolic CHF (congestive heart failure) ([place]); Cirrhosis, nonalcoholic ([place]); Clostridium difficile diarrhea; Compression fracture of lumbar vertebra ([place]); DVT (deep venous thrombosis) ([place]); Glaucoma; Hyperlipidemia; Hypertension; Kidney stones; Leukopenia; Liver disease; Myelodysplasia (myelodysplastic syndrome) ([place]); Pancytopenia ([place]); Pneumonia; and Thrombocytopenia ([place]).. He  has a past surgical history that includes AV fistula repair (1970's); Colonoscopy (4/12); Cardiac catheterization; eye surgery (04/12/2014); Cosmetic surgery (04/2013); bone marrow biopsy; and knee surgery.[name] He  reports that he has never smoked. He has never used smokeless tobacco. He reports that he does not drink alcohol or use illicit drugs..        Active Hospital Problems    Diagnosis Date Noted   ??? Closed displaced fracture of left talus [S92.102A] 09/03/2014   ??? 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  Idiopathic cardiomyopathy ([place]) [I42.8] 05/20/2014   ??? Hyperlipidemia [E78.5]    ??? Hypotension [I95.9] 04/01/2014   ??? Chronic systolic CHF (congestive heart failure) ([place]) [I50.22] 10/18/2013       Current Facility-Administered Medications: dextrose 5 % solution, , Intravenous, Continuous  lidocaine (XYLOCAINE) 2 % jelly, , Topical, PRN  cefUROXime (CEFTIN) tablet 500 mg, 500 mg, Oral, 2 times per day  acetaminophen (TYLENOL) suppository 650 mg, 650 mg, Rectal, Q4H PRN  haloperidol lactate (HALDOL) injection 2 mg, 2 mg, Intramuscular, Q6H PRN  sodium chloride flush 0.9 % injection 10 mL, 10 mL, Intravenous, 2 times per day  vancomycin (VANCOCIN) oral solution 250 mg, 250 mg, Oral, 3 times per day  carvedilol (COREG) tablet 3.125 mg, 3.125 mg, Oral, BID  vitamin D (CHOLECALCIFEROL) tablet 1,000 Units, 1,000 Units, Oral, Daily  citalopram (CELEXA) tablet 10 mg, 10 mg, Oral, Daily  ferrous sulfate tablet 325 mg, 325 mg, Oral, Daily with breakfast  magnesium oxide (MAG-OX) tablet 400 mg, 400 mg, Oral, BID  therapeutic multivitamin-minerals 1 tablet, 1 tablet, Oral, Daily  promethazine (PHENERGAN) tablet 12.5 mg, 12.5 mg, Oral, Q6H PRN  rifaximin (XIFAXAN) tablet 550 mg, 550 mg, Oral, BID  saccharomyces boulardii (FLORASTOR) capsule 250 mg, 250 mg, Oral, BID  traMADol (ULTRAM) tablet 50 mg, 50 mg, Oral, Q12H PRN  vitamin B-12 (CYANOCOBALAMIN) tablet 50 mcg, 50 mcg, Oral, Daily         Objective:  Visit Vitals   ??? BP 91/61   ??? Pulse 100   ??? Temp 98.4 ??F (36.9 ??C) (Axillary)   ??? Resp 20   ???  Ht 5' 5" (1.651 m)   ??? Wt 174 lb 12.8 oz (79.3 kg)   ??? SpO2 94%   ??? BMI 29.09 kg/m2        Patient Vitals for the past 24 hrs:   BP Temp Temp src Pulse Resp SpO2   09/15/14 0628 91/61 98.4 ??F (36.9 ??C) Axillary 100 20 94 %   09/15/14 0600 - - - 98 - -   09/15/14 0400 - - - 92 - -   09/15/14 0200 - - - 91 - -   09/15/14 0058 109/69 97.9 ??F (36.6 ??C) Axillary 92 20 97 %   09/15/14 0000 - - - 92 - -   09/14/14 2204 119/79 98.1 ??F (36.7 ??C)  Axillary 95 20 96 %   09/14/14 2200 - - - 94 - -   09/14/14 2000 - - - 95 - -   09/14/14 1843 - - - 92 - -   09/14/14 1600 - - - 84 - -   09/14/14 1545 105/71 98.2 ??F (36.8 ??C) Oral 88 18 97 %   09/14/14 1400 - - - 87 - -   09/14/14 1129 - - - 93 - -   09/14/14 7989 - - - 84 - -   09/14/14 0915 103/68 97.4 ??F (36.3 ??C) Axillary 84 18 97 %   09/14/14 0843 - - - - 18 91 %   09/14/14 0800 - - - 111 - -     Patient Vitals for the past 96 hrs (Last 3 readings):   Weight   09/11/14 1145 174 lb 12.8 oz (79.3 kg)           Intake/Output Summary (Last 24 hours) at 09/15/14 0753  Last data filed at 09/15/14 2119   Gross per 24 hour   Intake   1772 ml   Output    400 ml   Net   1372 ml         Physical Exam:   S1, S2 normal, no murmur, rub or gallop, regular rate and rhythm  clear to auscultation bilaterally  abdomen is soft without significant tenderness, masses, organomegaly or guarding  extremities normal, atraumatic, no cyanosis or edema    Labs:  Lab Results   Component Value Date    WBC 2.2 (L) 09/15/2014    HGB 7.3 (L) 09/15/2014    HCT 23.6 (L) 09/15/2014    PLT 46 (L) 09/14/2014    CHOL 101 04/02/2014    TRIG 83 04/02/2014    HDL 22 (L) 04/02/2014    ALT 27 09/12/2014    AST 47 (H) 09/12/2014    NA 157 (H) 09/15/2014    K 4.2 09/15/2014    CL 126 (H) 09/15/2014    CREATININE 1.1 09/15/2014    BUN 47 (H) 09/15/2014    CO2 26 09/15/2014    TSH 2.15 08/15/2013    PSA 0.55 08/15/2013    INR 1.41 (H) 09/02/2014    LABA1C 5.0 01/10/2014    LABMICR YES 09/02/2014     Lab Results   Component Value Date    CKTOTAL 33 (L) 09/02/2014    TROPONINI 0.02 (H) 09/02/2014       Recent Imaging Results are Reviewed:  Xr Ankle Left Standard    Result Date: 09/02/2014  EXAMINATION: 3 VIEWS OF THE LEFT ANKLE  09/02/2014 5:34 pm  COMPARISON: None.  HISTORY: ORDERING SYSTEM PROVIDED HISTORY: injury TECHNOLOGIST PROVIDED HISTORY: Ordering Physician Provided Reason for Exam: [place]  [place] [facility] [place] by [employer]. was using his  walker with wife as stand by and reportedly got weak and fell. per wife his responsiveness has been altered. Acuity: Unknown Type of Exam: Initial  FINDINGS: There is an acute mildly displaced fracture along the dorsal margin of the distal talus.  No acute fracture demonstrated elsewhere and no dislocation.     Acute fracture of the talus.     Ct Head Wo Contrast    Result Date: 09/02/2014  EXAMINATION: CT OF THE HEAD WITHOUT CONTRAST  09/02/2014 5:43 pm  TECHNIQUE: CT of the head was performed without the administration of intravenous contrast. Dose modulation, iterative reconstruction, and/or weight based adjustment of the mA/kV was utilized to reduce the radiation dose to as low as reasonably achievable.  COMPARISON: CT brain without contrast 07/07/2014  HISTORY: ORDERING SYSTEM PROVIDED HISTORY: HEAD TRAUMA, CLOSED, MILD, ABN NEURO EXAM AND/OR RISK FACTORS TECHNOLOGIST PROVIDED HISTORY: If patient is on cardiac monitor and/or pulse ox, they may be taken off cardiac monitor and pulse ox, left on O2 if currently on. All monitors reattached when patient returns to room. Ordering Physician Provided Reason for Exam: ams  FINDINGS: BRAIN/VENTRICLES: There is no acute intracranial hemorrhage, mass effect or midline shift. No abnormal extra-axial fluid collection.  The gray-white differentiation is maintained without evidence of an acute infarct. There is continued mild prominence of the ventricles and sulci due to global parenchymal volume loss.  There are nonspecific areas of hypoattenuation within the periventricular and subcortical white matter, which likely represent chronic microvascular ischemic change.  ORBITS: The visualized portion of the orbits demonstrate no acute abnormality.  SINUSES: The visualized paranasal sinuses and mastoid air cells demonstrate no acute abnormality.  SOFT TISSUES/SKULL: No acute abnormality of the visualized skull or soft tissues.     No acute intracranial abnormality.  Mild cerebral  atrophy unchanged.     US Gallbladder Ruq    Result Date: 09/13/2014  EXAMINATION: RIGHT UPPER QUADRANT ULTRASOUND  09/13/2014 10:25 am  COMPARISON: CT abdomen and pelvis, 07/30/2014  HISTORY: ORDERING SYSTEM PROVIDED HISTORY: TECHNOLOGIST PROVIDED HISTORY: Ordering Physician Provided Reason for Exam: pain Acuity: Unknown Type of Exam: Unknown  FINDINGS: LIVER:  The liver is small with undulating margin.  Parenchyma is mildly inhomogeneous.  No mass is identified.  BILIARY SYSTEM:  Gallbladder is unremarkable without evidence of pericholecystic fluid, wall thickening or stones.  Negative sonographic Murphy's sign.  Common bile duct is within normal limits measuring 6 mm.  RIGHT KIDNEY: The right kidney is grossly unremarkable without evidence of hydronephrosis. It measures 8.5 x 5.2 x 6.3 cm  PANCREAS:  Visualized portions of the pancreas are unremarkable.  OTHER: A right basilar pleural effusion is noted.     No cholelithiasis is identified.  The common duct is at the upper limits of normal in size.  Stable, cirrhotic morphology of the liver.  Right basal pleural effusion.     Xr Chest Portable    Result Date: 09/13/2014  EXAMINATION: SINGLE VIEW OF THE CHEST  09/13/2014 6:06 pm  COMPARISON: None.  HISTORY: ORDERING SYSTEM PROVIDED HISTORY: Check NG tube placement TECHNOLOGIST PROVIDED HISTORY: Low chest/high abdomen Ordering Physician Provided Reason for Exam: Check NG tube placement Acuity: Unknown Type of Exam: Unknown  FINDINGS: NG tube tip within the stomach.  Proximal port is at the GE junction which may predispose to gastroesophageal reflux.  Recommend advancement by 5-10 cm.  Bilateral pulmonary edema is present similar to prior examination.  Bilateral  pleural effusions.     Heart and lungs appears similar.  NG tube tip within the stomach.  Consider advancement.     Xr Chest Portable    Result Date: 09/11/2014  EXAMINATION: SINGLE VIEW OF THE CHEST  09/11/2014 9:08 pm  COMPARISON: 09/02/2014  HISTORY: ORDERING SYSTEM  PROVIDED HISTORY: Elevated temperature TECHNOLOGIST PROVIDED HISTORY: Ordering Physician Provided Reason for Exam: Elevated temperature Acuity: Acute Type of Exam: Unknown  FINDINGS: The heart is borderline enlarged but similar to the prior exam.  There is ill definition of the pulmonary vascularity.  There are bilateral symmetric perihilar and bibasilar infiltrates with small pleural effusions.     Findings are most consistent with development of moderate CHF.  Multifocal pneumonia cannot be excluded.     Xr Chest Portable    Result Date: 09/02/2014  EXAMINATION: SINGLE VIEW OF THE CHEST  09/02/2014 5:34 pm  COMPARISON: Chest x-ray Aug 04, 2014  HISTORY: ORDERING SYSTEM PROVIDED HISTORY: SOB TECHNOLOGIST PROVIDED HISTORY: Ordering Physician Provided Reason for Exam: [place] [facility] [place] [employer]. was using his walker with wife as stand by and reportedly got weak and fell. per wife his responsiveness has been altered. Acuity: Unknown Type of Exam: Initial  FINDINGS: There is a moderate-sized infiltrate of the right lung base.  No acute pulmonary process demonstrated elsewhere.  No definitive portable chest x-ray evidence of pleural effusion.  No pneumothorax demonstrated.  Cardiac and mediastinal shadows appear stable     Moderate amount of right basilar atelectasis versus pneumonia.  This is very similar to the comparison.       Assessment and Plan:  Patient Active Hospital Problem List:   Idiopathic cardiomyopathy ([place]) (05/20/2014)    Assessment: Stable    Plan: Continue present orders/plan   Chronic systolic CHF (congestive heart failure) ([place]) (10/18/2013)    Assessment: Stable    Plan: Continue present orders/plan   Hypotension (04/01/2014)    Assessment: Stable    Plan: Continue present orders/plan   Hyperlipidemia ()    Assessment: Stable    Plan: Continue present orders/plan   Myelodysplasia (myelodysplastic syndrome) ([place]) (07/19/2014)    Assessment: wbc still low. Plt not yet resulted     Plan: per Heme   Closed displaced fracture of left talus (09/03/2014)              Deniece Portela  09/15/2014

## 2014-09-15 NOTE — Progress Notes (Signed)
NEUROLOGY FOLLOWUP    HISTORY OF PRESENT ILLNESS :    Andrew [name] is a 74 y.o. male   History was obtained from The patient's wife at the bedside and dictations in the chart  Patient continues to be poorly responsive  Patient's sodium level is still high at 157  Patient has known myelodysplastic syndrome as well as liver disease and cirrhosis  Patient is not able to give any history    REVIEW OF SYSTEMS  Unable to obtain due to patient's neurological status  Constitutional:  []    Chills   []   Fatigue   []   Fevers   []   Malaise   []   Weight loss     []  Denies all of the above    Respiratory:   []   Cough    []   Shortness of breath         []  Denies all of the above     Cardiovascular:   []   Chest pain    []   Exertional chest pressure/discomfort           []  Palpitations    []   Syncope     []  Denies all of the above    Past Medical History   Diagnosis Date   ??? Allergic    ??? Alpha 1 antitrypsin deficiency    ??? Anemia    ??? Arthritis    ??? BPH (benign prostatic hyperplasia)    ??? CAD (coronary artery disease)      Non-obstructive   ??? Cancer ([place])    ??? Chronic systolic CHF (congestive heart failure) ([place]) 10/18/2013   ??? 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 Clostridium difficile diarrhea 09/04/2014   ??? Compression fracture of lumbar vertebra ([facility])      multiple, lumbar   ??? DVT (deep venous thrombosis) ([place])    ??? Glaucoma    ??? Hyperlipidemia    ??? Hypertension    ??? Kidney stones    ??? Leukopenia    ??? Liver disease    ??? 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 Pancytopenia ([place])    ??? Pneumonia    ??? Thrombocytopenia ([place]) 04/30/2009     Family History   Problem Relation Age of Onset   ??? Diabetes Mother    ??? Hypertension Mother    ??? Diabetes Brother    ??? Diabetes Sister      Social History     Social History   ??? Marital status: Married     Spouse name: N/A   ??? Number of children: N/A   ??? Years of  education: N/A     Social History Main Topics   ??? Smoking status: Never Smoker   ??? Smokeless tobacco: Never Used      Comment: Passive smoke exposure: yes   ??? Alcohol use No   ??? Drug use: No   ??? Sexual activity: Not Asked     Other Topics Concern   ??? None     Social History Narrative        PHYSICAL EXAMINATION     Visit Vitals   ??? BP 96/55   ??? Pulse 96   ??? Temp 100.2 ??F (37.9 ??C) (Axillary)   ??? Resp 18   ??? Ht 5\' 5"  (1.651 m)   ??? Wt 174 lb 12.8 oz (79.3 kg)   ??? SpO2 94%   ??? BMI 29.09 kg/m2  This is a ill appearing patient in no acute distress  Patient is poorly responsive.  Unable to participate in mental status testing.  Pupils equal round reacting to light.  Face symmetrical.  Tongue midline.  Minimal movement in arms and legs to painful stimuli.  Deep tendon reflexes diminished.  Plantar reflexes withdrawal.  Could not cooperate for sensory testing or coordination testing.  No carotid bruit.  No neck stiffness.  DATA :  LABS:  General Labs:    CBC:   Lab Results   Component Value Date    WBC 2.2 09/15/2014    WBC 2.6 08/30/2014    RBC 2.34 09/15/2014    RBC 2.96 08/30/2014    HGB 7.3 09/15/2014    HCT 23.6 09/15/2014    MCV 100.9 09/15/2014    MCH 31.3 09/15/2014    MCHC 31.0 09/15/2014    RDW 19.5 09/15/2014    PLT 43 09/15/2014    MPV 9.7 09/15/2014     BMP:    Lab Results   Component Value Date    NA 157 09/15/2014    K 4.2 09/15/2014    CL 126 09/15/2014    CO2 26 09/15/2014    BUN 47 09/15/2014    LABALBU 1.8 09/12/2014    CREATININE 1.1 09/15/2014    CALCIUM 8.6 09/15/2014    GFRAA >60 09/15/2014    GFRAA >60 05/14/2011    LABGLOM >60 09/15/2014    GLUCOSE 196 09/15/2014         IMPRESSION :  Severe metabolic encephalopathy due to persistent hypernatremia  Patient Active Problem List   Diagnosis   ??? Erectile dysfunction   ??? Thrombocytopenia ([place])   ??? Edema   ??? BPH (benign prostatic hyperplasia)   ??? Alpha-1-antitrypsin deficiency   ??? Cirrhosis, nonalcoholic ([place])   ??? Mild CAD   ??? Chronic systolic CHF  (congestive heart failure) ([place])   ??? Non-ischemic cardiomyopathy ([place])   ??? Lumbar compression fracture ([place])   ??? Chronic low back pain   ??? Osteopenia   ??? Coagulopathy ([place])   ??? Hypotension   ??? Abnormal EKG   ??? Visual changes   ??? Fixed pupil of right eye   ??? Anemia   ??? Hemispheric carotid artery syndrome   ??? CAD in native artery   ??? Hyperlipidemia   ??? Idiopathic cardiomyopathy ([place])   ??? Bronchiectasis without complication ([place])   ??? Benign essential HTN   ??? Metabolic encephalopathy   ??? Ataxia   ??? Pancytopenia ([place])   ??? DVT (deep venous thrombosis) ([place])   ??? 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 Pneumonia   ??? Traumatic hematoma of buttock   ??? Pneumonia of both lungs due to infectious organism   ??? Hematoma   ??? Closed displaced fracture of left talus       RECOMMENDATIONS :  Discussed with patient's wife at the bedside  Await sodium level to be corrected before evaluating prognosis from a neurological standpoint          Please note a portion of this chart was generated using dragon dictation software. Although every effort was made to ensure the accuracy of this automated transcription, some errors in transcription may have occurred.         Ermelinda Das.D.

## 2014-09-15 NOTE — Progress Notes (Signed)
VSS see flow sheet. Patient remains lethargic. Brief dry. Turned & repositioned for comfort/skin care. Administered vancomycin per NG tube. Spouse at bedside. Denies any other needs at this time. Will monitor. Bedside table, phone and call light within reach. All fall precautions in place.

## 2014-09-15 NOTE — Progress Notes (Signed)
Shift assessment completed, see flowsheets.  Medications administered, see MAR.  Plan of care discussed with pt and/or family.  Pt denies further needs at this time.  Will continue to monitor.  Daquisha Clermont, RN

## 2014-09-15 NOTE — Progress Notes (Signed)
Pt's temp spiked. Tylenol and ice pack given, effective. Spoke with wife about full code status. She called in her family, may change to DNR status. Called Dr. Tasia Catchings and ordered oral tylenol and ibuprofen. Pt has no IV access and nothing can be placed at this time d/t platelet count. Flush per NG increased to 500 ml every 6 hrs.

## 2014-09-15 NOTE — Progress Notes (Signed)
[facility]  Gastroenterology Progress Note  Andrew [name] is a 74 y.o. male patient.  1. Closed displaced fracture of left talus, unspecified portion of talus, initial encounter    2. Hypotension, unspecified hypotension type    3. Elevated troponin    4. Cirrhosis, nonalcoholic ([place])    5. Myelodysplasia (myelodysplastic syndrome) ([facility])        SUBJECTIVE:   Pt awake but nonverbal this am.  Wife says he is relatively unchanged. No diarrhea though    Physical    VITALS:    Visit Vitals   ??? BP 91/61   ??? Pulse 100   ??? Temp 98.4 ??F (36.9 ??C) (Axillary)   ??? Resp 20   ??? Ht 5\' 5"  (1.651 m)   ??? Wt 174 lb 12.8 oz (79.3 kg)   ??? SpO2 94%   ??? BMI 29.09 kg/m2     TEMPERATURE:  Current - Temp: 98.4 ??F (36.9 ??C); Max - Temp  Avg: 98 ??F (36.7 ??C)  Min: 97.4 ??F (36.3 ??C)  Max: 98.4 ??F (36.9 ??C)    Abdomen soft, ND, NT, no HSM, Bowel sounds normal   Awake but not interactive      Data      Recent Labs      09/13/14   0529  09/14/14   0627  09/15/14   0538   WBC  2.3*  2.3*  2.2*   HGB  8.2*  7.7*  7.3*   HCT  25.4*  23.8*  23.6*   MCV  96.8  97.2  100.9*   PLT  46*  46*   --      Recent Labs      09/13/14   0529  09/13/14   1841  09/14/14   0627  09/15/14   0538   NA  158*  157*  156*  157*   K  4.1   --   3.9  4.2   CL  127*   --   123*  126*   CO2  25   --   24  26   PHOS  3.5   --    --    --    BUN  43*   --   51*  47*   CREATININE  1.1   --   1.2  1.1           ??  ASSESSMENT ??  1. 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 C. Diff. On abx but still with diarrhea  3. Op dysphagia- Neuro w/u in process  4. Encephalopathy-  May be all due to hypernatremia.   ??  ??  PLAN ??  1. Cont TF   2. Cont abx  3. Correct hypernatremia ??  4. Would not place a PEG at this point until Na corrected and other causes of MS changes r/o.   He is high risk for PEG complications including bleeding.    ??  Burnell Blanks, MD  [place] [facility] [place]  ??

## 2014-09-15 NOTE — Progress Notes (Addendum)
Department of Internal Medicine  Nephrology Attending Progress Note        SUBJECTIVE:  We are following this patient for Hypernatremia . Patient progress reviewed.   Physical Exam:    VITALS:    Visit Vitals   ??? BP 91/61   ??? Pulse 100   ??? Temp 98.4 ??F (36.9 ??C) (Axillary)   ??? Resp 20   ??? Ht 5\' 5"  (1.651 m)   ??? Wt 174 lb 12.8 oz (79.3 kg)   ??? SpO2 94%   ??? BMI 29.09 kg/m2     BLOOD PRESSURE RANGE:  Systolic (44IHK), VQQ:595 , Min:91 , GLO:756   ; Diastolic (43PIR), JJO:84, Min:61, Max:79    24HR INTAKE/OUTPUT:      Intake/Output Summary (Last 24 hours) at 09/15/14 0910  Last data filed at 09/15/14 1660   Gross per 24 hour   Intake   1772 ml   Output    400 ml   Net   1372 ml       Constitutional:  Drowsy   Pallor +   Cardiovascular/Edema:  Regular heart sounds , no murmur   Respiratory:  CTA  Gastrointestinal:  distended  Other:  Edema +     DATA:    CBC with Differential:    Lab Results   Component Value Date    WBC 2.2 09/15/2014    WBC 2.6 08/30/2014    RBC 2.34 09/15/2014    RBC 2.96 08/30/2014    HGB 7.3 09/15/2014    HCT 23.6 09/15/2014    PLT 43 09/15/2014    MCV 100.9 09/15/2014    MCH 31.3 09/15/2014    MCHC 31.0 09/15/2014    RDW 19.5 09/15/2014    NRBC 4 09/13/2014    SEGSPCT 42.6 05/14/2011    BANDSPCT 10 09/13/2014    METASPCT 4 09/02/2014    LYMPHOPCT 44.0 09/13/2014    LYMPHOPCT 41.9 08/30/2014    MONOPCT 3.0 09/13/2014    MYELOPCT 5 09/02/2014    EOSPCT 2.1 05/14/2010    BASOPCT 0.0 09/13/2014    MONOSABS 0.1 09/13/2014    LYMPHSABS 1.0 09/13/2014    EOSABS 0.0 09/13/2014    BASOSABS 0.0 09/13/2014    DIFFTYPE Scan-K 05/14/2011     CMP:    Lab Results   Component Value Date    NA 157 09/15/2014    K 4.2 09/15/2014    CL 126 09/15/2014    CO2 26 09/15/2014    BUN 47 09/15/2014    CREATININE 1.1 09/15/2014    GFRAA >60 09/15/2014    GFRAA >60 05/14/2011    AGRATIO 0.5 09/12/2014    LABGLOM >60 09/15/2014    GLUCOSE 196 09/15/2014    PROT 5.3 09/12/2014    PROT 6.0 05/14/2011    LABALBU 1.8 09/12/2014     CALCIUM 8.6 09/15/2014    BILITOT 2.1 09/12/2014    ALKPHOS 98 09/12/2014    AST 47 09/12/2014    ALT 27 09/12/2014     Uric Acid:  No components found for: URIC  U/A:    Lab Results   Component Value Date    NITRITE neg 10/18/2013    COLORU DK YELLOW 09/02/2014    COLORU yellow 10/18/2013    PHUR 5.0 09/02/2014    PHUR 5.0 10/18/2013    WBCUA 1 09/02/2014    RBCUA 10 09/02/2014    CLARITYU CLOUDY 09/02/2014    CLARITYU clear 10/18/2013    SPECGRAV 1.014 09/02/2014  SPECGRAV 1.010 10/18/2013    LEUKOCYTESUR Negative 09/02/2014    LEUKOCYTESUR neg 10/18/2013    UROBILINOGEN 1.0 09/02/2014    BILIRUBINUR Negative 09/02/2014    BILIRUBINUR neg 10/18/2013    BLOODU SMALL 09/02/2014    BLOODU neg 10/18/2013    GLUCOSEU Negative 09/02/2014    GLUCOSEU neg 10/18/2013         IMPRESSION/RECOMMENDATIONS:      Patient Active Problem List   Diagnosis   ??? Erectile dysfunction   ??? Thrombocytopenia ([place])   ??? Edema   ??? BPH (benign prostatic hyperplasia)   ??? Alpha-1-antitrypsin deficiency   ??? Cirrhosis, nonalcoholic ([place])   ??? Mild CAD   ??? Chronic systolic CHF (congestive heart failure) ([place])   ??? Non-ischemic cardiomyopathy ([place])   ??? Lumbar compression fracture ([place])   ??? Chronic low back pain   ??? Osteopenia   ??? Coagulopathy ([place])   ??? Hypotension   ??? Abnormal EKG   ??? Visual changes   ??? Fixed pupil of right eye   ??? Anemia   ??? Hemispheric carotid artery syndrome   ??? CAD in native artery   ??? Hyperlipidemia   ??? Idiopathic cardiomyopathy ([place])   ??? Bronchiectasis without complication ([place])   ??? Benign essential HTN   ??? Metabolic encephalopathy   ??? Ataxia   ??? Pancytopenia ([place])   ??? DVT (deep venous thrombosis) ([place])   ??? 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 Pneumonia   ??? Traumatic hematoma of buttock   ??? Pneumonia of both lungs due to infectious organism   ??? Hematoma   ??? Closed displaced fracture of left talus        Diagnosis and Plan :   1. Hypernatremia - Na 157 , Cr 1.1 , has no IV access, Increase Free water 500 ml every  6 hrs   2. Myelodysplasia   3. Non ischemic cardiomyopathy

## 2014-09-16 LAB — CBC WITH AUTO DIFFERENTIAL
Bands Relative: 10 % — ABNORMAL HIGH (ref 0–7)
Basophils %: 0 %
Basophils Absolute: 0 10*3/uL (ref 0.0–0.2)
Eosinophils %: 0 %
Eosinophils Absolute: 0 10*3/uL (ref 0.0–0.6)
Hematocrit: 24 % — ABNORMAL LOW (ref 40.5–52.5)
Hemoglobin: 7.8 g/dL — ABNORMAL LOW (ref 13.5–17.5)
Lymphocytes %: 42 %
Lymphocytes Absolute: 1 10*3/uL (ref 1.0–5.1)
MCH: 31.7 pg (ref 26.0–34.0)
MCHC: 32.7 g/dL (ref 31.0–36.0)
MCV: 97.2 fL (ref 80.0–100.0)
MPV: 10.2 fL (ref 5.0–10.5)
Monocytes %: 12 %
Monocytes Absolute: 0.3 10*3/uL (ref 0.0–1.3)
Neutrophils %: 36 %
Neutrophils Absolute: 1.1 10*3/uL — ABNORMAL LOW (ref 1.7–7.7)
PLATELET SLIDE REVIEW: DECREASED
Platelets: 44 10*3/uL — ABNORMAL LOW (ref 135–450)
RBC: 2.47 M/uL — ABNORMAL LOW (ref 4.20–5.90)
RDW: 18.5 % — ABNORMAL HIGH (ref 12.4–15.4)
WBC: 2.5 10*3/uL — ABNORMAL LOW (ref 4.0–11.0)
nRBC: 11 /100 WBC — AB

## 2014-09-16 LAB — BASIC METABOLIC PANEL
Anion Gap: 4 (ref 3–16)
BUN: 46 mg/dL — ABNORMAL HIGH (ref 7–20)
CO2: 28 mmol/L (ref 21–32)
Calcium: 8.2 mg/dL — ABNORMAL LOW (ref 8.3–10.6)
Chloride: 121 mmol/L — ABNORMAL HIGH (ref 99–110)
Creatinine: 1 mg/dL (ref 0.8–1.3)
GFR African American: >60 (ref >60)
GFR Non-African American: >60 (ref >60)
Glucose: 198 mg/dL — ABNORMAL HIGH (ref 70–99)
Potassium: 4.1 mmol/L (ref 3.5–5.1)
Sodium: 153 mmol/L — ABNORMAL HIGH (ref 136–145)

## 2014-09-16 MED ORDER — FERROUS SULFATE 300 (60 FE) MG/5ML PO SYRP
300 (60 Fe) MG/5ML | Freq: Every day | ORAL | Status: DC
Start: 2014-09-16 — End: 2014-09-19
  Administered 2014-09-16 – 2014-09-18 (×3): 300 mg via NASOGASTRIC

## 2014-09-16 MED ORDER — DARBEPOETIN ALFA 300 MCG/ML IJ SOLN
300 MCG/ML | INTRAMUSCULAR | Status: DC
Start: 2014-09-16 — End: 2014-09-18

## 2014-09-16 MED FILL — FERROUS SULFATE 300 (60 FE) MG/5ML PO SYRP: 300 (60 Fe) MG/5ML | ORAL | Qty: 5

## 2014-09-16 MED FILL — FLORASTOR 250 MG PO CAPS: 250 MG | ORAL | Qty: 1

## 2014-09-16 MED FILL — CEFUROXIME AXETIL 250 MG PO TABS: 250 MG | ORAL | Qty: 2

## 2014-09-16 MED FILL — CITALOPRAM HYDROBROMIDE 20 MG PO TABS: 20 MG | ORAL | Qty: 1

## 2014-09-16 MED FILL — VANCOMYCIN 50 MG/ML ORAL SOLUTION: 50 mg/mL | ORAL | Qty: 5

## 2014-09-16 MED FILL — FERROUS SULFATE 325 (65 FE) MG PO TABS: 325 (65 Fe) MG | ORAL | Qty: 1

## 2014-09-16 MED FILL — XIFAXAN 550 MG PO TABS: 550 MG | ORAL | Qty: 1

## 2014-09-16 MED FILL — THERAPEUTIC MULTIVIT/MINERAL PO TABS: ORAL | Qty: 1

## 2014-09-16 MED FILL — NORMAL SALINE FLUSH 0.9 % IV SOLN: 0.9 % | INTRAVENOUS | Qty: 10

## 2014-09-16 MED FILL — VITAMIN B-12 100 MCG PO TABS: 100 MCG | ORAL | Qty: 1

## 2014-09-16 MED FILL — CARVEDILOL 3.125 MG PO TABS: 3.125 MG | ORAL | Qty: 1

## 2014-09-16 MED FILL — MAGNESIUM OXIDE 400 (241.3 MG) MG PO TABS: 400 (241.3 Mg) MG | ORAL | Qty: 1

## 2014-09-16 MED FILL — VITAMIN D3 25 MCG (1000 UT) PO TABS: 25 MCG (1000 UT) | ORAL | Qty: 1

## 2014-09-16 NOTE — Progress Notes (Signed)
[facility]  Gastroenterology Progress Note  Andrew [name] is a 74 y.o. male patient.  1. Closed displaced fracture of left talus, unspecified portion of talus, initial encounter    2. Hypotension, unspecified hypotension type    3. Elevated troponin    4. Cirrhosis, nonalcoholic ([place])    5. Myelodysplasia (myelodysplastic syndrome) (HCC)        SUBJECTIVE:  tol tf and has no diarrhea at this point.     Physical    VITALS:    Visit Vitals   ??? BP 98/63   ??? Pulse 83   ??? Temp 97.9 ??F (36.6 ??C) (Axillary)   ??? Resp 18   ??? Ht 5\' 5"  (1.651 m)   ??? Wt 174 lb 12.8 oz (79.3 kg)   ??? SpO2 98%   ??? BMI 29.09 kg/m2     TEMPERATURE:  Current - Temp: 97.9 ??F (36.6 ??C); Max - Temp  Avg: 99.2 ??F (37.3 ??C)  Min: 97.9 ??F (36.6 ??C)  Max: 101.6 ??F (38.7 ??C)    Abdomen soft, ND, NT, no HSM, Bowel sounds normal   Somnolent.  Not interactive  Data      Recent Labs      09/14/14   0627  09/15/14   0538  09/16/14   0521   WBC  2.3*  2.2*  2.5*   HGB  7.7*  7.3*  7.8*   HCT  23.8*  23.6*  24.0*   MCV  97.2  100.9*  97.2   PLT  46*  43*  44*     Recent Labs      09/14/14   0627  09/15/14   0538  09/16/14   0521   NA  156*  157*  153*   K  3.9  4.2  4.1   CL  123*  126*  121*   CO2  24  26  28    BUN  51*  47*  46*   CREATININE  1.2  1.1  1.0         ASSESSMENT   1. Cirrhosis  2. C. Diff improved  3. Op dysphagia -has peg.   4. Encephalopathy- Na improved but still high.          PLAN    1. Correct Na  2. Cont TF  3. Would not place a PEG at this point until Na corrected and other causes of MS changes r/o. He is high risk for PEG complications including bleeding. ??    Burnell Blanks, MD  [place] [facility] [place]

## 2014-09-16 NOTE — Progress Notes (Addendum)
VSS see flow sheet. Patient incontinent of medium soft brown stool. Brief changed and peri care given. Turned & repositioned for comfort/skin care. Will monitor. Family at bedside. Bedside table, phone and call light within reach. All fall precautions in place.

## 2014-09-16 NOTE — Progress Notes (Addendum)
Occupational/Physical Therapy    Attempted OT/PT co-tx this AM. SLP currently working with pt. Will follow-up with pt as schedule allows and as pt is medically appropriate.    Thanks,  Kristine Linea, MOT, OTR/L [id number]  Richelle Ito, [place]

## 2014-09-16 NOTE — Progress Notes (Signed)
Occupational Therapy  Facility/Department: [facility]  Daily Treatment Note  NAME: Desmond Szabo  DOB: [birth year]  MRN: [id number]    Date of Service: 09/16/2014    Patient Diagnosis(es):   Patient Active Problem List    Diagnosis Date Noted   ??? Erectile dysfunction 04/30/2009     Priority: Low   ??? Closed displaced fracture of left talus 09/03/2014   ??? Hematoma 07/31/2014   ??? Pneumonia of both lungs due to infectious organism    ??? Traumatic hematoma of buttock    ??? Pneumonia 07/25/2014   ??? 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 DVT (deep venous thrombosis) ([place]) 89/38/1017   ??? Metabolic encephalopathy 51/04/5850   ??? Ataxia 07/07/2014   ??? Pancytopenia ([place]) 07/07/2014   ??? Benign essential HTN 07/03/2014   ??? Bronchiectasis without complication ([place]) 77/82/4235   ??? Idiopathic cardiomyopathy ([place]) 05/20/2014   ??? 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 Anemia 04/02/2014   ??? Hemispheric carotid artery syndrome    ??? CAD in native artery    ??? Hyperlipidemia    ??? Hypotension 04/01/2014   ??? Abnormal EKG 04/01/2014   ??? Visual changes 04/01/2014   ??? Lumbar compression fracture ([place]) 01/14/2014   ??? Chronic low back pain 01/14/2014   ??? Osteopenia 01/14/2014   ??? Coagulopathy ([place]) 01/14/2014   ??? Non-ischemic cardiomyopathy ([place]) 11/06/2013   ??? Chronic systolic CHF (congestive heart failure) ([place]) 10/18/2013   ??? Mild CAD    ??? Cirrhosis, nonalcoholic ([place])    ??? 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 BPH (benign prostatic hyperplasia) 08/01/2009   ??? Thrombocytopenia ([place]) 04/30/2009   ??? Edema 04/30/2009       Past Medical History   Diagnosis Date   ??? Allergic    ??? Alpha 1 antitrypsin deficiency    ??? Anemia    ??? Arthritis    ??? BPH (benign prostatic hyperplasia)    ??? CAD (coronary artery disease)      Non-obstructive   ??? Cancer ([place])    ??? Chronic systolic CHF (congestive heart failure) ([place]) 10/18/2013   ??? 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 Clostridium difficile diarrhea  09/04/2014   ??? Compression fracture of lumbar vertebra (HCC)      multiple, lumbar   ??? DVT (deep venous thrombosis) ([place])    ??? Glaucoma    ??? Hyperlipidemia    ??? Hypertension    ??? Kidney stones    ??? Leukopenia    ??? Liver disease    ??? 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 Pancytopenia ([place])    ??? Pneumonia    ??? Thrombocytopenia ([place]) 04/30/2009     Past Surgical History   Procedure Laterality Date   ??? Av fistula repair  1970's   ??? Colonoscopy  4/12     5y   ??? Cardiac catheterization     ??? Eye surgery  04/12/2014     Laser Eye Surgery for glaucoma   ??? Cosmetic surgery  04/2013     eye lids   ??? Bone marrow biopsy     ??? Knee surgery         Restrictions  Restrictions/Precautions  Restrictions/Precautions: Fall Risk, Weight Bearing  Lower Extremity Weight Bearing Restrictions  Right Lower Extremity Weight Bearing: Weight Bearing As Tolerated  Left Lower Extremity Weight Bearing: Non Weight Bearing  Subjective   General  Chart Reviewed: Yes  Additional Pertinent Hx: BPH, CAD, CA, Lumbar compression fx, HTN, Myelodysplasia  Response to previous treatment: Patient with no complaints from previous session  Family / Caregiver Present: Yes (wife & son)  Diagnosis: Fall, L talus fx  Subjective  Subjective: Patient in supine, awakened to name, follows commands with delayed response & agreeable to OT & PT cotx.  Pre Treatment Pain Screening  Intervention List: Patient able to continue with treatment  Pain Assessment  Patient Currently in Pain: Denies  Vital Signs  Patient Currently in Pain: Denies   Orientation  Orientation  Overall Orientation Status: Impaired  Orientation Level: Oriented to person  Objective    ADL  Grooming: Moderate assistance;Verbal cueing;Increased time to complete (Hand over Hand assist to initiate face washing. Pt observed with posterior leaning when this writer was attempting to initiate face washing.)  Balance  Sitting Balance: Moderate assistance (Mod A progressed to CGA seated at  EOB. Occassional Mod A while seated at EOB.)  Bed mobility  Supine to Sit: Dependent/Total (Max A of 2)  Sit to Supine: Dependent/Total (Max A of 2)  Cognition  Overall Cognitive Status: Impaired  Arousal/Alertness: Delayed responses to stimuli  Following Directions: Follows one step commands  Attention Span: Attends with cues to redirect  Following Commands: Follows one step commands with repetition (& increased time)  Safety Judgement: Decreased awareness of need for assistance  Sequencing and Organization: Assistance required to identify errors made;Assistance required to generate solutions;Assistance required to implement solutions  Perception  Overall Perceptual Status: Impaired  Initiation: Hand over hand to initiate tasks  Motor Planning: Hand over hand to sequence tasks  Functional activity tolerance: Pt observed with Fair activity tolerance.  Assessment   Conditions Requiring Skilled Therapeutic Intervention : Decreased functional mobility ;Decreased ADL status;Decreased strength;Decreased safe awareness;Decreased cognition;Decreased endurance;Decreased high-level IADLs;Decreased balance  Treatment Diagnosis: Decreased ADLs, transfers, activity tolerance associated with fall, talus fx  Prognosis: Fair  Patient Education: Evaluation, safety, sitting tolerance, simple grooming  Discharge Recommendations: Patient would benefit from continued therapy after discharge;Subacute/Skilled Nursing Facility;24 hour supervision or assist  Requires OT Follow Up: Yes  Timed Code Treatment Minutes: 26 Minutes  Total Treatment Time: 26  Activity Tolerance  Activity Tolerance: Patient limited by fatigue;Treatment limited secondary to decreased cognition  Safety Devices  Safety Devices in place: Yes  Type of devices: All fall risk precautions in place;Bed alarm in place;Call light within reach;Left in bed;Nurse notified  OT Equipment Recommendations  Equipment Needed: No          Discharge Recommendations:  Patient would  benefit from continued therapy after discharge, [place], 24 hour supervision or assist     Plan   Plan  Times per week: 2-5x  Times per day: Daily  Specific instructions for Next Treatment: simple ADLs, lateral transfer  Current Treatment Recommendations: Strengthening, Hotel manager, Engineer, production, Child psychotherapist, Proofreader, Scientist, clinical (histocompatibility and immunogenetics), Self-Care / ADL    Short term goals  Time Frame for Short term goals: Discharge  Short term goal 1: Patient will perform bed mobility modA of 1--Dependent, Max A of 2 supine<>sit  Short term goal 2: Patient will tolerate sitting EOB supervision x 10-12 minutes to assist with ADL completion--~ 10 min seated at EOB for simple grooming  Short term goal 3: Patient will perform simple grooming tasks minA--Mod A  Short term goal 4: Patient will tolerate lateral, slide board transfer (due to NWB LLE) with maxA of 1--(not addressed this treatment session)  Long term goals  Time Frame for Long term goals : LTG=STG       Baker Pierini, [place] COTA/L [phone]

## 2014-09-16 NOTE — Progress Notes (Signed)
Department of Internal Medicine  General Internal Medicine   Progress Note      SUBJECTIVE: more alert and responsive  Compared to last weekend     History obtained from chart review and the patient  General ROS: positive for  - fatigue and malaise  negative for - chills, fever or night sweats  Psychological ROS: negative  Respiratory ROS: no cough, shortness of breath, or wheezing  Cardiovascular ROS: no chest pain or dyspnea on exertion  Gastrointestinal ROS: no abdominal pain, change in bowel habits, or black or bloody stools  Genito-Urinary ROS: no dysuria, trouble voiding, or hematuria  Musculoskeletal ROS: positive for - foot pain   negative for - joint swelling    OBJECTIVE      Medications      Current Facility-Administered Medications: [START ON 10/16/14] darbepoetin alfa-polysorbate SOLN 300 mcg, 300 mcg, Subcutaneous, Q7 Days  ferrous sulfate 300 (60 FE) MG/5ML syrup 300 mg, 300 mg, Per NG tube, Daily  ibuprofen (ADVIL;MOTRIN) tablet 400 mg, 400 mg, Oral, Q6H PRN  acetaminophen (TYLENOL) 160 MG/5ML solution 650 mg, 650 mg, Oral, Q4H PRN  dextrose 5 % solution, , Intravenous, Continuous  lidocaine (XYLOCAINE) 2 % jelly, , Topical, PRN  cefUROXime (CEFTIN) tablet 500 mg, 500 mg, Oral, 2 times per day  acetaminophen (TYLENOL) suppository 650 mg, 650 mg, Rectal, Q4H PRN  haloperidol lactate (HALDOL) injection 2 mg, 2 mg, Intramuscular, Q6H PRN  sodium chloride flush 0.9 % injection 10 mL, 10 mL, Intravenous, 2 times per day  vancomycin (VANCOCIN) oral solution 250 mg, 250 mg, Oral, 3 times per day  carvedilol (COREG) tablet 3.125 mg, 3.125 mg, Oral, BID  vitamin D (CHOLECALCIFEROL) tablet 1,000 Units, 1,000 Units, Oral, Daily  citalopram (CELEXA) tablet 10 mg, 10 mg, Oral, Daily  magnesium oxide (MAG-OX) tablet 400 mg, 400 mg, Oral, BID  therapeutic multivitamin-minerals 1 tablet, 1 tablet, Oral, Daily  promethazine (PHENERGAN) tablet 12.5 mg, 12.5 mg, Oral, Q6H PRN  rifaximin (XIFAXAN) tablet 550 mg, 550  mg, Oral, BID  saccharomyces boulardii (FLORASTOR) capsule 250 mg, 250 mg, Oral, BID  traMADol (ULTRAM) tablet 50 mg, 50 mg, Oral, Q12H PRN  vitamin B-12 (CYANOCOBALAMIN) tablet 50 mcg, 50 mcg, Oral, Daily    Physical      VITALS:    Visit Vitals   ??? BP (!) 80/55   ??? Pulse 86   ??? Temp 97.9 ??F (36.6 ??C) (Axillary)   ??? Resp 16   ??? Ht 5\' 5"  (1.651 m)   ??? Wt 174 lb 12.8 oz (79.3 kg)   ??? SpO2 93%   ??? BMI 29.09 kg/m2     TEMPERATURE:  Current - Temp: 97.9 ??F (36.6 ??C); Max - Temp  Avg: 97.4 ??F (36.3 ??C)  Min: 96.4 ??F (35.8 ??C)  Max: 97.9 ??F (36.6 ??C)  RESPIRATIONS RANGE: Resp  Avg: 17.2  Min: 16  Max: 19  PULSE RANGE: Pulse  Avg: 87.7  Min: 69  Max: 169  BLOOD PRESSURE RANGE:  Systolic (78GNF), AOZ:30 , Min:80 , QMV:784   ; Diastolic (69GEX), BMW:41, Min:55, Max:70    PULSE OXIMETRY RANGE: SpO2  Avg: 95.7 %  Min: 92 %  Max: 98 %  24HR INTAKE/OUTPUT:      Intake/Output Summary (Last 24 hours) at 09/17/14 0052  Last data filed at 09/16/14 1708   Gross per 24 hour   Intake   1260 ml   Output    685 ml   Net    575  ml     CONSTITUTIONAL:  fatigued, alert, uncooperative, distracted, mild distress and appears stated age  NECK:  Mild JVD   BACK:  wnl  LUNGS:  No increased work of breathing, good air exchange, clear to auscultation bilaterally, no crackles or wheezing  CARDIOVASCULAR:  Normal apical impulse, regular rate and rhythm, normal S1 and S2, no S3 or S4, and no murmur noted  ABDOMEN:  Soft non tender BS +  MUSCULOSKELETAL:  Tenderness left foot  NEUROLOGIC:  No acute focal     Data      No results found for: PHART, PO2ART, PCO2ART, HCO3ART, BEART, O2SATART    Lab Results   Component Value Date    NA 153 09/16/2014    K 4.1 09/16/2014    CL 121 09/16/2014    CO2 28 09/16/2014    BUN 46 09/16/2014    CREATININE 1.0 09/16/2014    GLUCOSE 198 09/16/2014    CALCIUM 8.2 09/16/2014     Lab Results   Component Value Date    WBC 2.5 09/16/2014    WBC 2.6 08/30/2014    HGB 7.8 09/16/2014    HCT 24.0 09/16/2014    MCV 97.2  09/16/2014    PLT 44 09/16/2014         Lab Results   Component Value Date    INR 1.41 (H) 09/02/2014    PROTIME 16.2 (H) 09/02/2014       ASSESSMENT AND PLAN      Patient Active Problem List   Diagnosis   ??? Erectile dysfunction   ??? Thrombocytopenia ([place])   ??? Edema   ??? BPH (benign prostatic hyperplasia)   ??? Alpha-1-antitrypsin deficiency   ??? Cirrhosis, nonalcoholic ([place])   ??? Mild CAD   ??? Chronic systolic CHF (congestive heart failure) ([place])   ??? Non-ischemic cardiomyopathy ([place])   ??? Lumbar compression fracture ([place])   ??? Chronic low back pain   ??? Osteopenia   ??? Coagulopathy ([place])   ??? Hypotension   ??? Abnormal EKG   ??? Visual changes   ??? Fixed pupil of right eye   ??? Anemia   ??? Hemispheric carotid artery syndrome   ??? CAD in native artery   ??? Hyperlipidemia   ??? Idiopathic cardiomyopathy ([place])   ??? Bronchiectasis without complication ([place])   ??? Benign essential HTN   ??? Metabolic encephalopathy   ??? Ataxia   ??? Pancytopenia ([place])   ??? DVT (deep venous thrombosis) ([place])   ??? 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 Pneumonia   ??? Traumatic hematoma of buttock   ??? Pneumonia of both lungs due to infectious organism   ??? Hematoma   ??? Closed displaced fracture of left talus      TTE shows LVEF   30-25 percent , biatrial enlargement combined systolic and diastolic heart failure , still hypernatremic and has prerenal azotemia , wife not interested in hospice at all , metabolic encephalopathy not resolved yet , ct NG tube feeding  Nephrology co-management

## 2014-09-16 NOTE — Progress Notes (Signed)
Pt. Repositioned. Wife remains at bedside. Pt. Denies any other additional needs at this time. Call light in reach

## 2014-09-16 NOTE — Progress Notes (Signed)
VSS. Pt. Repositioned at this time. Pt. Denies pain or discomfort.  Family at bedside. Pt. Denies any other additional needs at this time. Call light in reach

## 2014-09-16 NOTE — Progress Notes (Signed)
Pt resting quietly in bed. Respirations even and unlabored. Bed alarm on, call light in reach. Will continue to monitor.

## 2014-09-16 NOTE — Progress Notes (Signed)
VSS. Pt. Repositioned in bed. Wife remains at bedside. Pt. Denies any other additional needs at this time. Call light in reach

## 2014-09-16 NOTE — Progress Notes (Signed)
Pt. In bed at this time. Pt. stated he did not want to be repositioned. Wife at bedside. Pt. Denies any other additional needs at this time. Call light in reach

## 2014-09-16 NOTE — Progress Notes (Signed)
Pt. In bed at this time Family at bedside. Pt. Denies any other additional needs at this time. Call light in reach

## 2014-09-16 NOTE — Progress Notes (Signed)
Brief dry. Turned & repositioned for comfort/skin care. VSS see flow sheet. Patient remains more alert. Family at  Bedside. Will monitor. Bedside table, phone and call light within reach. All fall precautions in place.

## 2014-09-16 NOTE — Progress Notes (Signed)
Physical Therapy  Facility/Department: [facility] ONCOLOGY  Daily Treatment Note  NAME: Andrew [name]  DOB: 1940-08-24  MRN: [id number]    Date of Service: 09/16/2014    Patient Diagnosis(es):   Patient Active Problem List    Diagnosis Date Noted   ??? Erectile dysfunction 04/30/2009     Priority: Low   ??? Closed displaced fracture of left talus 09/03/2014   ??? Hematoma 07/31/2014   ??? Pneumonia of both lungs due to infectious organism    ??? Traumatic hematoma of buttock    ??? Pneumonia 07/25/2014   ??? 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 DVT (deep venous thrombosis) ([place]) 53/66/4403   ??? Metabolic encephalopathy 47/42/5956   ??? Ataxia 07/07/2014   ??? Pancytopenia ([place]) 07/07/2014   ??? Benign essential HTN 07/03/2014   ??? Bronchiectasis without complication ([place]) 38/75/6433   ??? 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 Anemia 04/02/2014   ??? Hemispheric carotid artery syndrome    ??? CAD in native artery    ??? Hyperlipidemia    ??? Hypotension 04/01/2014   ??? Abnormal EKG 04/01/2014   ??? Visual changes 04/01/2014   ??? Lumbar compression fracture ([place]) 01/14/2014   ??? Chronic low back pain 01/14/2014   ??? Osteopenia 01/14/2014   ??? Coagulopathy ([place]) 01/14/2014   ??? Non-ischemic cardiomyopathy ([place]) 11/06/2013   ??? Chronic systolic CHF (congestive heart failure) ([place]) 10/18/2013   ??? Mild CAD    ??? Cirrhosis, nonalcoholic ([place])    ??? 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 BPH (benign prostatic hyperplasia) 08/01/2009   ??? Thrombocytopenia ([place]) 04/30/2009   ??? Edema 04/30/2009       Past Medical History   Diagnosis Date   ??? Allergic    ??? Alpha 1 antitrypsin deficiency    ??? Anemia    ??? Arthritis    ??? BPH (benign prostatic hyperplasia)    ??? CAD (coronary artery disease)      Non-obstructive   ??? Cancer ([place])    ??? Chronic systolic CHF (congestive heart failure) ([place]) 10/18/2013   ??? 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 Clostridium difficile diarrhea  09/04/2014   ??? Compression fracture of lumbar vertebra (HCC)      multiple, lumbar   ??? DVT (deep venous thrombosis) ([place])    ??? Glaucoma    ??? Hyperlipidemia    ??? Hypertension    ??? Kidney stones    ??? Leukopenia    ??? Liver disease    ??? 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 Pancytopenia ([place])    ??? Pneumonia    ??? Thrombocytopenia ([place]) 04/30/2009     Past Surgical History   Procedure Laterality Date   ??? Av fistula repair  1970's   ??? Colonoscopy  4/12     5y   ??? Cardiac catheterization     ??? Eye surgery  04/12/2014     Laser Eye Surgery for glaucoma   ??? Cosmetic surgery  04/2013     eye lids   ??? Bone marrow biopsy     ??? Knee surgery         Restrictions  Restrictions/Precautions  Restrictions/Precautions: Fall Risk, Weight Bearing  Lower Extremity Weight Bearing Restrictions  Right Lower Extremity Weight Bearing: Weight Bearing As Tolerated  Left Lower Extremity Weight Bearing: Non Weight Bearing  Subjective   General  Chart Reviewed: Yes  Additional Pertinent Hx: BPH, CAD, CA, Lumbar compression fx, HTN, Myelodysplasia  Response To Previous Treatment: Patient with no complaints from previous session.  Family / Caregiver Present: Yes (wife, son arrive during treatment )  Subjective  Subjective: Pt denies pain at this time, willing to participate.   General Comment  Comments: Patient in supine, awakened to name, follows commands with delayed response  Pain Screening  Patient Currently in Pain: Denies  Vital Signs  Patient Currently in Pain: Denies       Orientation  Orientation  Overall Orientation Status: Impaired  Orientation Level: Oriented to person;Disoriented to place;Disoriented to time;Disoriented to situation  Objective   Bed Mobility  Rolling: Maximal assistance  Supine to Sit: Dependent/Total (max assist x 2 )  Sit to Supine: Dependent/Total (max assist x 2 )  Scooting: Dependent/Total (max assist x 2 )  Transfers  Sit to Stand: Unable to assess(comment) (unsafe to attempt at this time.   )  Ambulation  Ambulation?: No  Stairs/Curb  Stairs?: No     Balance  Posture: Fair  Sitting - Static: Fair;-  Sitting - Dynamic: Poor  Comments: Pt sat EOB x10 minutes requiring CGA, with occasional mod assist to maintain balance.  When pt would attempt to bring own hand to face for grooming, pt would lean posteriorly, with mod assist to maintain upright. With fatigue tendency to lean onto L UE, while still CGAx1.    Exercises  Comments: Pt participated in [facility] LAQ and hip flexion of BLEs with max cues to participate in activity.      Comment: Pt in bed upon arrival, willing to participate.  Supine to sit with max assist  x 2 this date, once on EOB able to maintain x 10 minutes for grooming activities and ther ex.  Pt with primarily CGA with occasional mod assist to maintain upright.  Return to supine with max assist x 2, scoot up in bed with max assist x 2.  Pt positioned for comfort and left with all needs within reach and bed alarm engaged.                Assessment   Assessment: Decreased functional mobility ;Decreased strength;Decreased safe awareness;Decreased cognition;Decreased endurance;Decreased balance;Decreased coordination  Assessment: Pt currently not at baseline functioning related to recent dx and would benefit from skilled PT intervention to address stated deficits and facilitate return to PLOF.   Specific instructions for Next Treatment: simple ADLs, lateral transfer  Prognosis: Fair;Guarded  Patient Education: Educated pt and spouse on role of PT, POC, and discharge recommendations.   Requires PT Follow Up: Yes  Total Treatment Time: 26  Timed Code Treatment Minutes: 26 Minutes  Activity Tolerance  Activity Tolerance: Treatment limited secondary to medical complications (free text);Patient limited by fatigue;Patient limited by cognitive status  PT Equipment Recommendations  Equipment Needed: No       Discharge Recommendations:  Subacute/Skilled Nursing Facility, Patient would benefit from  continued therapy after discharge    G-Code       Goals  Short term goals  Time Frame for Short term goals: to be met prior to discharge   Short term goal 1: Pt to complete all bed mobility with maxA.   Short term goal 2: Pt to sit EOB x10 minutes with SUPV.   Short term goal 3: Patient will complete lateral, slide board transfer (due to NWB LLE) with maxA of 1.   No goals met this date, continue all goals  Plan    Plan  Times per week: 2-5x  Times per day: Daily  Specific instructions for Next Treatment: simple ADLs, lateral transfer  Current Treatment Recommendations: Strengthening;ROM;Balance Training;Functional Mobility Training;Transfer Training;Engineer, production;Wheelchair Mobility Training;Neuromuscular Re-education;Pain Management;Home Exercise Program;Safety Education & Youth worker, Education, Designer, industrial/product Devices  Type of devices: All fall risk precautions in place;Bed alarm in place;Call light within reach;Patient at risk for falls;Left in bed;Nurse notified  Restraints  Initially in place: No     Richelle Ito, [place]

## 2014-09-16 NOTE — Progress Notes (Signed)
Report and bedside handoff complete with Empress G, RN

## 2014-09-16 NOTE — Progress Notes (Signed)
Turned & repositioned for comfort/skin care. Denies any other needs at this time. Will monitor. Bedside table, phone and call light within reach. All fall precautions in place.

## 2014-09-16 NOTE — Progress Notes (Signed)
Shift assessment complete. meds administered as ordered. VSS.  Family at bedside. Pt. Denies pain or discomfort.  Pt. Denies any other additional needs at this time. Call light in reach

## 2014-09-16 NOTE — Progress Notes (Signed)
Nutrition Assessment    Type and Reason for Visit: Reassess    Nutrition Recommendations:   1. Continue 2 cal HN at goal rate 45 mL per hour  2. Defer water bolus to nephrology  3. Ensure head of bed is 30 - 45 degrees during continuous gastric feeding. Turn off the feeding if head of bed is lowered less than 30 degrees.  4. Monitor for tolerance (residuals greater than 500 mL, bowel habits, N/V, cramping).    Malnutrition Assessment:  ?? Malnutrition Status: Meets the criteria for severe malnutrition  ?? Context: Acute illness or injury  ?? Findings of the 6 clinical characteristics of malnutrition (Minimum of 2 out of 6 clinical characteristics is required to make the diagnosis of moderate or severe Protein Calorie Malnutrition based on AND/ASPEN Guidelines):  1. Energy Intake-Less than or equal to 50%, greater than 7 days    2. Weight Loss-10% loss or greater,  (4 months)  3. Fat Loss-Unable to assess,    4. Muscle Loss-Unable to assess,    5. Fluid Accumulation-No significant fluid accumulation,    6. Grip Strength-Not measured    Nutrition Diagnosis:   ?? Problem: Inadequate oral intake  ?? Etiology: related to Insufficient energy/nutrient consumption, Difficulty swallowing    ??? Signs and symptoms:  as evidenced by NPO status due to medical condition, Diet history of poor intake, Intake 0-25%    Nutrition Assessment:  ?? Subjective Assessment: Pt started on 2cal HN on 7/8, currently tolerating well at goal rate 45 mL per hour with residuals WNL. Sodium continues to be high at 153, but is improved since yesterday. Nephrology is managing water bolus, currently 500 mL q 6 hours. SLP continues to recommend NPO. Diarrhea has improved, pt now with normal formed BM's per wife at bedside. Weight stable. Per GI note today, will not place PEG at this point until Na corrected and other causes of MS changes are r/o. Will continue to follow.    ?? Current Nutrition Therapies:  ?? Oral Diet Orders: NPO   ?? Oral Diet intake:  NPO  ?? Oral Nutrition Supplement (ONS) Orders: None  ?? ONS intake: NPO  ?? Tube Feeding (TF) Orders:   ?? Feeding Route: Nasogastric  ?? Formula: Fluid Restricted (2 cal HN )  ?? Rate (ml/hr):45 mL per hour    ?? Volume (ml/day): 1080 mL   ?? Duration: Continuous 24hrs  ?? TF Residuals: Less than or equal to 232ml  ?? Water Flushes:  (500 mL q 6 hours per nephrology )  ?? Goal TF & Flush Orders Provides: 2 cal HN at goal rate 45 mL per hour to provide 1080 mL total volume, 2160 calories, 90 grams protein, 756 mL free water. Defer water bolus to nephrology.   ?? Anthropometric Measures:  ?? Ht: 5\' 5"  (165.1 cm)   ?? Current Body Wt: 174 lb (78.9 kg)  ?? Usual Body Wt: 188 lb (85.3 kg) (per wife)  ?? Ideal Body Wt: 136 lb (61.7 kg)   ?? BMI Classification: BMI 25.0 - 29.9 Overweight  ?? Comparative Standards (Estimated Nutrition Needs):  ?? Estimated Daily Total Kcal: 1975-2370   ?? Estimated Daily Protein (g): 95-119 grams     Estimated Intake vs Estimated Needs: Intake Meets Needs    Nutrition Risk Level: Moderate    Nutrition Interventions:   Continue current Tube Feeding  Continued Inpatient Monitoring, Education Not Indicated, Speech Therapy    Nutrition Evaluation:   ?? Evaluation: Goal achieved   ??  Goals: Pt will tolerate EN at goal    ?? Monitoring: TF Intake, Tolerance to TF, Weight Status, Mental Status/Confusion, Gastric Residuals, Comparative Standards, Pertinent Labs, Chewing/Swallowing, Diarrhea    See Adult Nutrition Doc Flowsheet for more detail.     Electronically signed by Greer Ee, RD, LD on 09/16/14 at 2:05 PM    Contact Number: [phone]

## 2014-09-16 NOTE — Progress Notes (Signed)
Speech Language/Pathology  Dysphagia Treatment Note    Name:  Andrew [name]  DOB:   [birth year]  Medical Diagnosis:  Hypotension [I95.9]  Treatment Diagnosis: Oropharyngeal Dysphagia  Pain level:  Pt did not report pain    Current Diet Level: NPO with nutrition via NG    Tolerance of Recommended Diet Level:  Pt tolerating tube feeds.     Assessment of Texture Tolerance: Pt was positioned upright in bed. Pt able to follow 1 step directions. Responses remain delayed with moderately reduced speech intelligibility. Pt exhibited intermittent gagging suspected to be due to NG tube. One trial of honey thick liquid was provided. Pt demonstrated decreased A-p propulsion with suspected premature bolus loss and moderately delayed swallow onset. Laryngeal elevation appeared decreased with immediate wet vocal quality and delayed cough. Pt was suctioned with minimal amount cleared. At this time pt continues to demonstrate high risk for aspiration with inconsistent ability to accept PO.     Thermal stimulation provided x3 pt able to initiate delayed swallow x2 with immediate throat clearing.     Plan:  Continued daily Dysphagia treatment with goals per plan of care.    Recommendations:  1.) Continue NPO with alternative means of nutrition/hydration. Recommend frequent oral care.   2)Continue recommended oral motor/bolus control exercises, and DPNS treatments per clinical swallow eval Plan of Care.    Patient/Family Education:Education given to the Pt (no evidence of learning) and his wife, who verbalized understanding.    Discharge Recommendations:  SNF with Speech Therapy for Speech/Dysphagia treatment at discharge.    Timed Code Treatment:  0 minutes     Total Treatment Time:  15 minutes       Joaquim Lai, [place]  Speech Language Pathologist

## 2014-09-16 NOTE — Progress Notes (Signed)
Pt resting in bed with family at bedside. Routine VSS. Assessment completed and documented in flowsheets. Tolerating tube feed well with minimal residual. Bed alarm on, call light in reach. Pt does not appear to be in pain or distress. Will continue to monitor.

## 2014-09-16 NOTE — Progress Notes (Signed)
Department of Internal Medicine  Nephrology Attending Progress Note        SUBJECTIVE:  We are following this patient for Hypernatremia . Patient progress reviewed.   Physical Exam:    VITALS:    Visit Vitals   ??? BP 93/66   ??? Pulse 86   ??? Temp 96.8 ??F (36 ??C) (Axillary)   ??? Resp 17   ??? Ht 5\' 5"  (1.651 m)   ??? Wt 174 lb 12.8 oz (79.3 kg)   ??? SpO2 98%   ??? BMI 29.09 kg/m2     BLOOD PRESSURE RANGE:  Systolic (32KGU), RKY:70 , Min:90 , WCB:76   ; Diastolic (28BTD), VVO:16, Min:55, Max:66    24HR INTAKE/OUTPUT:      Intake/Output Summary (Last 24 hours) at 09/16/14 1106  Last data filed at 09/16/14 0939   Gross per 24 hour   Intake   2909 ml   Output    450 ml   Net   2459 ml       Constitutional:  Drowsy   Pallor +   Cardiovascular/Edema:  Regular heart sounds , no murmur   Respiratory:  CTA  Gastrointestinal:  distended  Other:  Edema +     DATA:    CBC with Differential:    Lab Results   Component Value Date    WBC 2.5 09/16/2014    WBC 2.6 08/30/2014    RBC 2.47 09/16/2014    RBC 2.96 08/30/2014    HGB 7.8 09/16/2014    HCT 24.0 09/16/2014    PLT 44 09/16/2014    MCV 97.2 09/16/2014    MCH 31.7 09/16/2014    MCHC 32.7 09/16/2014    RDW 18.5 09/16/2014    NRBC 11 09/16/2014    SEGSPCT 42.6 05/14/2011    BANDSPCT 10 09/16/2014    METASPCT 4 09/02/2014    LYMPHOPCT 42.0 09/16/2014    LYMPHOPCT 41.9 08/30/2014    MONOPCT 12.0 09/16/2014    MYELOPCT 5 09/02/2014    EOSPCT 2.1 05/14/2010    BASOPCT 0.0 09/16/2014    MONOSABS 0.3 09/16/2014    LYMPHSABS 1.0 09/16/2014    EOSABS 0.0 09/16/2014    BASOSABS 0.0 09/16/2014    DIFFTYPE Scan-K 05/14/2011     CMP:    Lab Results   Component Value Date    NA 153 09/16/2014    K 4.1 09/16/2014    CL 121 09/16/2014    CO2 28 09/16/2014    BUN 46 09/16/2014    CREATININE 1.0 09/16/2014    GFRAA >60 09/16/2014    GFRAA >60 05/14/2011    AGRATIO 0.5 09/12/2014    LABGLOM >60 09/16/2014    GLUCOSE 198 09/16/2014    PROT 5.3 09/12/2014    PROT 6.0 05/14/2011    LABALBU 1.8 09/12/2014     CALCIUM 8.2 09/16/2014    BILITOT 2.1 09/12/2014    ALKPHOS 98 09/12/2014    AST 47 09/12/2014    ALT 27 09/12/2014     Uric Acid:  No components found for: URIC  U/A:    Lab Results   Component Value Date    NITRITE neg 10/18/2013    COLORU DK YELLOW 09/02/2014    COLORU yellow 10/18/2013    PHUR 5.0 09/02/2014    PHUR 5.0 10/18/2013    WBCUA 1 09/02/2014    RBCUA 10 09/02/2014    CLARITYU CLOUDY 09/02/2014    CLARITYU clear 10/18/2013    SPECGRAV 1.014 09/02/2014  SPECGRAV 1.010 10/18/2013    LEUKOCYTESUR Negative 09/02/2014    LEUKOCYTESUR neg 10/18/2013    UROBILINOGEN 1.0 09/02/2014    BILIRUBINUR Negative 09/02/2014    BILIRUBINUR neg 10/18/2013    BLOODU SMALL 09/02/2014    BLOODU neg 10/18/2013    GLUCOSEU Negative 09/02/2014    GLUCOSEU neg 10/18/2013         IMPRESSION/RECOMMENDATIONS:      Patient Active Problem List   Diagnosis   ??? Erectile dysfunction   ??? Thrombocytopenia ([place])   ??? Edema   ??? BPH (benign prostatic hyperplasia)   ??? Alpha-1-antitrypsin deficiency   ??? Cirrhosis, nonalcoholic ([place])   ??? Mild CAD   ??? Chronic systolic CHF (congestive heart failure) ([place])   ??? Non-ischemic cardiomyopathy ([place])   ??? Lumbar compression fracture ([place])   ??? Chronic low back pain   ??? Osteopenia   ??? Coagulopathy ([place])   ??? Hypotension   ??? Abnormal EKG   ??? Visual changes   ??? Fixed pupil of right eye   ??? Anemia   ??? Hemispheric carotid artery syndrome   ??? CAD in native artery   ??? Hyperlipidemia   ??? 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 Bronchiectasis without complication ([place])   ??? Benign essential HTN   ??? Metabolic encephalopathy   ??? Ataxia   ??? Pancytopenia ([place])   ??? DVT (deep venous thrombosis) ([place])   ??? 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 Pneumonia   ??? Traumatic hematoma of buttock   ??? Pneumonia of both lungs due to infectious organism   ??? Hematoma   ??? Closed displaced fracture of left talus        Diagnosis and Plan :   1. Hypernatremia - lower at 153   2. Myelodysplasia   3. Non ischemic cardiomyopathy

## 2014-09-16 NOTE — Progress Notes (Signed)
Oncology and Hematology Care   Progress Note      09/16/2014 8:11 AM        Name: Andrew [name] .              Admitted: 09/02/2014    SUBJECTIVE:  Pt remains lethargic.  Low grade fevers overnight.  Denies pain.    Reviewed interval ancillary notes    Current Medications    ibuprofen (ADVIL;MOTRIN) tablet 400 mg Q6H PRN   acetaminophen (TYLENOL) 160 MG/5ML solution 650 mg Q4H PRN   dextrose 5 % solution Continuous   lidocaine (XYLOCAINE) 2 % jelly PRN   cefUROXime (CEFTIN) tablet 500 mg 2 times per day   acetaminophen (TYLENOL) suppository 650 mg Q4H PRN   haloperidol lactate (HALDOL) injection 2 mg Q6H PRN   sodium chloride flush 0.9 % injection 10 mL 2 times per day   vancomycin (VANCOCIN) oral solution 250 mg 3 times per day   carvedilol (COREG) tablet 3.125 mg BID   vitamin D (CHOLECALCIFEROL) tablet 1,000 Units Daily   citalopram (CELEXA) tablet 10 mg Daily   ferrous sulfate tablet 325 mg Daily with breakfast   magnesium oxide (MAG-OX) tablet 400 mg BID   therapeutic multivitamin-minerals 1 tablet Daily   promethazine (PHENERGAN) tablet 12.5 mg Q6H PRN   rifaximin (XIFAXAN) tablet 550 mg BID   saccharomyces boulardii (FLORASTOR) capsule 250 mg BID   traMADol (ULTRAM) tablet 50 mg Q12H PRN   vitamin B-12 (CYANOCOBALAMIN) tablet 50 mcg Daily       Objective:  Visit Vitals   ??? BP 98/63   ??? Pulse 83   ??? Temp 97.9 ??F (36.6 ??C) (Axillary)   ??? Resp 18   ??? Ht 5\' 5"  (1.651 m)   ??? Wt 174 lb 12.8 oz (79.3 kg)   ??? SpO2 98%   ??? BMI 29.09 kg/m2       Intake/Output Summary (Last 24 hours) at 09/16/14 0811  Last data filed at 09/16/14 5621   Gross per 24 hour   Intake   2409 ml   Output    450 ml   Net   1959 ml    Wt Readings from Last 3 Encounters:   09/11/14 174 lb 12.8 oz (79.3 kg)   08/30/14 167 lb 9.6 oz (76 kg)   08/29/14 169 lb (76.7 kg)       General appearance:  Appears comfortable, chronically ill appearing.  Eyes: Sclera clear. Pupils equal.  ENT: Moist oral mucosa. Trachea midline, no adenopathy.  NG tube in  place.  Cardiovascular: NSR, generalized 1-2+ edema.  Respiratory: Not using accessory muscles. Good inspiratory effort. Clear to auscultation bilaterally, no wheeze or crackles.   GI: Abdomen soft, no tenderness, not distended, normal bowel sounds  Musculoskeletal: No cyanosis in digits, neck supple  Neurology: lethargic  Psych: oriented to self.  Skin: Warm, dry, normal turgor    Labs and Tests:  CBC:   Recent Labs      09/14/14   0627  09/15/14   0538  09/16/14   0521   WBC  2.3*  2.2*  2.5*   HGB  7.7*  7.3*  7.8*   PLT  46*  43*  44*     BMP:  Recent Labs      09/14/14   0627  09/15/14   0538  09/16/14   0521   NA  156*  157*  153*   K  3.9  4.2  4.1  CL  123*  126*  121*   CO2  24  26  28    BUN  51*  47*  46*   CREATININE  1.2  1.1  1.0   GLUCOSE  202*  196*  198*     Hepatic: No results for input(s): AST, ALT, ALB, BILITOT, ALKPHOS in the last 72 hours.    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  ??  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 Transfuse as needed. Goal to maintain hgb around 8.  Transfuse plts if < 10.  Receiving weekly Aranesp on Friday while inpatient.  ????  C Diff colitis - diarrhea resolved.  Continue oral vancomycin.  ????  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  Hypernatremia - continue free water boluses    Dysphagia - continue NG feedings.    Chronic systolic CHF - ef 98-11%.  Severe LVH.    Anasarca - 2/2 hypoalbuminemia, chf, esld.    Fevers, possible pneumonia - on ceftin per NG tube.    Discussed with pt's wife.    [name], [place]  Oncology Hematology Care  Office: [phone]  Cell: [phone]

## 2014-09-17 LAB — CBC
Hematocrit: 22.9 % — ABNORMAL LOW (ref 40.5–52.5)
Hemoglobin: 7.5 g/dL — ABNORMAL LOW (ref 13.5–17.5)
MCH: 32.6 pg (ref 26.0–34.0)
MCHC: 32.8 g/dL (ref 31.0–36.0)
MCV: 99.4 fL (ref 80.0–100.0)
MPV: 10.1 fL (ref 5.0–10.5)
PLATELET SLIDE REVIEW: DECREASED
Platelets: 42 10*3/uL — ABNORMAL LOW (ref 135–450)
RBC: 2.31 M/uL — ABNORMAL LOW (ref 4.20–5.90)
RDW: 18.8 % — ABNORMAL HIGH (ref 12.4–15.4)
WBC: 2.5 10*3/uL — ABNORMAL LOW (ref 4.0–11.0)

## 2014-09-17 LAB — BASIC METABOLIC PANEL
Anion Gap: 5 (ref 3–16)
BUN: 43 mg/dL — ABNORMAL HIGH (ref 7–20)
CO2: 27 mmol/L (ref 21–32)
Calcium: 8.2 mg/dL — ABNORMAL LOW (ref 8.3–10.6)
Chloride: 120 mmol/L — ABNORMAL HIGH (ref 99–110)
Creatinine: 0.7 mg/dL — ABNORMAL LOW (ref 0.8–1.3)
GFR African American: >60 (ref >60)
GFR Non-African American: >60 (ref >60)
Glucose: 194 mg/dL — ABNORMAL HIGH (ref 70–99)
Potassium: 4.4 mmol/L (ref 3.5–5.1)
Sodium: 152 mmol/L — ABNORMAL HIGH (ref 136–145)

## 2014-09-17 LAB — CULTURE BLOOD #1: Blood Culture, Routine: NO GROWTH

## 2014-09-17 MED FILL — XIFAXAN 550 MG PO TABS: 550 MG | ORAL | Qty: 1

## 2014-09-17 MED FILL — VITAMIN D3 25 MCG (1000 UT) PO TABS: 25 MCG (1000 UT) | ORAL | Qty: 1

## 2014-09-17 MED FILL — NORMAL SALINE FLUSH 0.9 % IV SOLN: 0.9 % | INTRAVENOUS | Qty: 10

## 2014-09-17 MED FILL — CARVEDILOL 3.125 MG PO TABS: 3.125 MG | ORAL | Qty: 1

## 2014-09-17 MED FILL — VITAMIN B-12 100 MCG PO TABS: 100 MCG | ORAL | Qty: 1

## 2014-09-17 MED FILL — MAGNESIUM OXIDE 400 (241.3 MG) MG PO TABS: 400 (241.3 Mg) MG | ORAL | Qty: 1

## 2014-09-17 MED FILL — CEFUROXIME AXETIL 250 MG PO TABS: 250 MG | ORAL | Qty: 2

## 2014-09-17 MED FILL — VANCOMYCIN 50 MG/ML ORAL SOLUTION: 50 mg/mL | ORAL | Qty: 5

## 2014-09-17 MED FILL — ACETAMINOPHEN 160 MG/5ML PO SOLN: 160 MG/5ML | ORAL | Qty: 20.3

## 2014-09-17 MED FILL — CITALOPRAM HYDROBROMIDE 20 MG PO TABS: 20 MG | ORAL | Qty: 1

## 2014-09-17 MED FILL — THERAPEUTIC MULTIVIT/MINERAL PO TABS: ORAL | Qty: 1

## 2014-09-17 MED FILL — FERROUS SULFATE 300 (60 FE) MG/5ML PO SYRP: 300 (60 Fe) MG/5ML | ORAL | Qty: 5

## 2014-09-17 MED FILL — FLORASTOR 250 MG PO CAPS: 250 MG | ORAL | Qty: 1

## 2014-09-17 NOTE — Progress Notes (Signed)
Repositioned pt in bed, routine VSS. Assessment completed in flowsheets. NG tube placement verified with air bolus. Minimal TF residual. Pt does not appear to be in any pain or distress. Wife and family at bedside. Spoke with wife regarding pt DNR status per her request as she appeared very distressed over pt's situation. Became tearful at times. No further changes to code status. Bed alarm engaged, call light in reach. Will continue to monitor.

## 2014-09-17 NOTE — Progress Notes (Signed)
Pt. Attempting to remove NG. NG advanced, placement checked and secured with tape. Wife remains at bedside. Pt. Denies any other additional needs at this time. Call light in reach

## 2014-09-17 NOTE — Progress Notes (Signed)
Pt resting quietly in bed. Respirations even and unlabored. Bed alarm on, call light in reach. Will continue to monitor.

## 2014-09-17 NOTE — Oncology Nurse Navigation (Signed)
Oncology and Hematology Care   Progress Note    09/17/2014    SUBJECTIVE:  Patient remains very lethargic today. He is tolerating feeding tube. Nurse states he has mild loose stool which could be due to tube feeding Wife is present and remains hopeful wanting everything done    OBJECTIVE:    Physical Assessment:  Vitals:    Visit Vitals   ??? BP 103/68   ??? Pulse 85   ??? Temp 96.9 ??F (36.1 ??C) (Axillary)   ??? Resp 17   ??? Ht 5\' 5"  (1.651 m)   ??? Wt 174 lb 12.8 oz (79.3 kg)   ??? SpO2 91%   ??? BMI 29.09 kg/m2      24HR INTAKE/OUTPUT:    Intake/Output Summary (Last 24 hours) at 09/17/14 0753  Last data filed at 09/17/14 0439   Gross per 24 hour   Intake   3898 ml   Output    760 ml   Net   3138 ml       CONSTITUTIONAL: lethargic  HEENT: PERRL, Neck: soft, supple, no cervical, supraclavicular or axillary adenopathy  RESPIRATORY:  No increased work of breathing, breath sounds decreased.  CARDIOVASCULAR:  Cardiac S1/S2, RRR  GASTROINTESTINAL:  abdomen soft +BS x4, no hepatosplenomegaly has ascites  SKIN:  negative for rash and skin lesion(s)  MUSCULOSKELETAL:  negative for pain  Has muscle weakness  EXTREMITIES:  Has 2-3+ edema    Labs Results:    CBC: Recent Labs      09/15/14   0538  09/16/14   0521  09/17/14   0529   WBC  2.2*  2.5*  2.5*   HGB  7.3*  7.8*  7.5*   HCT  23.6*  24.0*  22.9*   MCV  100.9*  97.2  99.4   PLT  43*  44*  42*     BMP: Recent Labs      09/15/14   0538  09/16/14   0521  09/17/14   0529   NA  157*  153*  152*   K  4.2  4.1  4.4   CL  126*  121*  120*   CO2  26  28  27    BUN  47*  46*  43*   CREATININE  1.1  1.0  0.7*     LIVER PROFILE: No results for input(s): AST, ALT, LIPASE, BILIDIR, BILITOT, ALKPHOS in the last 72 hours.    Invalid input(s):  AMYLASE,  ALB  PT/INR:    Lab Results   Component Value Date    PROTIME 16.2 09/02/2014    PROTIME 15.2 08/01/2014    PROTIME 15.7 07/25/2014    INR 1.41 09/02/2014    INR 1.33 08/01/2014    INR 1.37 07/25/2014    INR 1.17 05/14/2010    INR 1.18 02/13/2010     INR 1.15 10/17/2009     PTT:    Lab Results   Component Value Date    APTT 30.7 09/02/2014    APTT 33.5 08/01/2014    APTT 46.5 07/25/2014    APTT 29.7 10/17/2009     UA:No results for input(s): NITRITE, COLORU, PHUR, LABCAST, WBCUA, RBCUA, MUCUS, TRICHOMONAS, YEAST, BACTERIA, CLARITYU, SPECGRAV, LEUKOCYTESUR, UROBILINOGEN, BILIRUBINUR, BLOODU, GLUCOSEU, AMORPHOUS in the last 72 hours.    Invalid input(s): KETONESU  Magnesium:   Lab Results   Component Value Date    MG 2.20 09/02/2014    MG 1.50 01/14/2014    MG  1.70 01/10/2014     ANA:  Lab Results   Component Value Date    ANATITER Negative at <1-80 10/17/2009    ANA POSITIVE at 1-40 10/17/2009       RADIOLOGY:    Xr Ankle Left Standard    Result Date: 09/02/2014  EXAMINATION: 3 VIEWS OF THE LEFT ANKLE  09/02/2014 5:34 pm  COMPARISON: None.  HISTORY: ORDERING SYSTEM PROVIDED HISTORY: injury TECHNOLOGIST PROVIDED HISTORY: Ordering Physician Provided Reason for Exam: patient [place] [facility] brought in by [employer]. was using his walker with wife as stand by and reportedly got weak and fell. per wife his responsiveness has been altered. Acuity: Unknown Type of Exam: Initial  FINDINGS: There is an acute mildly displaced fracture along the dorsal margin of the distal talus.  No acute fracture demonstrated elsewhere and no dislocation.     Acute fracture of the talus.     Ct Head Wo Contrast    Result Date: 09/02/2014  EXAMINATION: CT OF THE HEAD WITHOUT CONTRAST  09/02/2014 5:43 pm  TECHNIQUE: CT of the head was performed without the administration of intravenous contrast. Dose modulation, iterative reconstruction, and/or weight based adjustment of the mA/kV was utilized to reduce the radiation dose to as low as reasonably achievable.  COMPARISON: CT brain without contrast 07/07/2014  HISTORY: ORDERING SYSTEM PROVIDED HISTORY: HEAD TRAUMA, CLOSED, MILD, ABN NEURO EXAM AND/OR RISK FACTORS TECHNOLOGIST PROVIDED HISTORY: If patient is on cardiac monitor and/or pulse  ox, they may be taken off cardiac monitor and pulse ox, left on O2 if currently on. All monitors reattached when patient returns to room. Ordering Physician Provided Reason for Exam: ams  FINDINGS: BRAIN/VENTRICLES: There is no acute intracranial hemorrhage, mass effect or midline shift. No abnormal extra-axial fluid collection.  The gray-white differentiation is maintained without evidence of an acute infarct. There is continued mild prominence of the ventricles and sulci due to global parenchymal volume loss.  There are nonspecific areas of hypoattenuation within the periventricular and subcortical white matter, which likely represent chronic microvascular ischemic change.  ORBITS: The visualized portion of the orbits demonstrate no acute abnormality.  SINUSES: The visualized paranasal sinuses and mastoid air cells demonstrate no acute abnormality.  SOFT TISSUES/SKULL: No acute abnormality of the visualized skull or soft tissues.     No acute intracranial abnormality.  Mild cerebral atrophy unchanged.     US Gallbladder Ruq    Result Date: 09/13/2014  EXAMINATION: RIGHT UPPER QUADRANT ULTRASOUND  09/13/2014 10:25 am  COMPARISON: CT abdomen and pelvis, 07/30/2014  HISTORY: ORDERING SYSTEM PROVIDED HISTORY: TECHNOLOGIST PROVIDED HISTORY: Ordering Physician Provided Reason for Exam: pain Acuity: Unknown Type of Exam: Unknown  FINDINGS: LIVER:  The liver is small with undulating margin.  Parenchyma is mildly inhomogeneous.  No mass is identified.  BILIARY SYSTEM:  Gallbladder is unremarkable without evidence of pericholecystic fluid, wall thickening or stones.  Negative sonographic Murphy's sign.  Common bile duct is within normal limits measuring 6 mm.  RIGHT KIDNEY: The right kidney is grossly unremarkable without evidence of hydronephrosis. It measures 8.5 x 5.2 x 6.3 cm  PANCREAS:  Visualized portions of the pancreas are unremarkable.  OTHER: A right basilar pleural effusion is noted.     No cholelithiasis is  identified.  The common duct is at the upper limits of normal in size.  Stable, cirrhotic morphology of the liver.  Right basal pleural effusion.     Xr Chest Portable    Result Date: 09/13/2014  EXAMINATION: SINGLE  VIEW OF THE CHEST  09/13/2014 6:06 pm  COMPARISON: None.  HISTORY: ORDERING SYSTEM PROVIDED HISTORY: Check NG tube placement TECHNOLOGIST PROVIDED HISTORY: Low chest/high abdomen Ordering Physician Provided Reason for Exam: Check NG tube placement Acuity: Unknown Type of Exam: Unknown  FINDINGS: NG tube tip within the stomach.  Proximal port is at the GE junction which may predispose to gastroesophageal reflux.  Recommend advancement by 5-10 cm.  Bilateral pulmonary edema is present similar to prior examination.  Bilateral pleural effusions.     Heart and lungs appears similar.  NG tube tip within the stomach.  Consider advancement.     Xr Chest Portable    Result Date: 09/11/2014  EXAMINATION: SINGLE VIEW OF THE CHEST  09/11/2014 9:08 pm  COMPARISON: 09/02/2014  HISTORY: ORDERING SYSTEM PROVIDED HISTORY: Elevated temperature TECHNOLOGIST PROVIDED HISTORY: Ordering Physician Provided Reason for Exam: Elevated temperature Acuity: Acute Type of Exam: Unknown  FINDINGS: The heart is borderline enlarged but similar to the prior exam.  There is ill definition of the pulmonary vascularity.  There are bilateral symmetric perihilar and bibasilar infiltrates with small pleural effusions.     Findings are most consistent with development of moderate CHF.  Multifocal pneumonia cannot be excluded.     Xr Chest Portable    Result Date: 09/02/2014  EXAMINATION: SINGLE VIEW OF THE CHEST  09/02/2014 5:34 pm  COMPARISON: Chest x-ray Aug 04, 2014  HISTORY: ORDERING SYSTEM PROVIDED HISTORY: SOB TECHNOLOGIST PROVIDED HISTORY: Ordering Physician Provided Reason for Exam: [facility] [place] [facility] [place] by [employer]. was using his walker with wife as stand by and reportedly got weak and fell. per wife his responsiveness has  been altered. Acuity: Unknown Type of Exam: Initial  FINDINGS: There is a moderate-sized infiltrate of the right lung base.  No acute pulmonary process demonstrated elsewhere.  No definitive portable chest x-ray evidence of pleural effusion.  No pneumothorax demonstrated.  Cardiac and mediastinal shadows appear stable     Moderate amount of right basilar atelectasis versus pneumonia.  This is very similar to the comparison.       Current Medications  ??? [START ON 10/14/14] darbepoetin alfa-polysorbate  300 mcg Subcutaneous Q7 Days   ??? ferrous sulfate  300 mg Per NG tube Daily   ??? cefUROXime  500 mg Oral 2 times per day   ??? sodium chloride flush  10 mL Intravenous 2 times per day   ??? vancomycin  250 mg Oral 3 times per day   ??? carvedilol  3.125 mg Oral BID   ??? vitamin D  1,000 Units Oral Daily   ??? citalopram  10 mg Oral Daily   ??? magnesium oxide  400 mg Oral BID   ??? therapeutic multivitamin-minerals  1 tablet Oral Daily   ??? rifaximin  550 mg Oral BID   ??? saccharomyces boulardii  250 mg Oral BID   ??? 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 He would be due to for second cycle on Monday- receiving weekly aranesp as inpatient.   c diff colitis remains on oral vancomycin  Cirrhosis due to nash   hypernatremia management by renal has tube feed and is getting free water boluses  Dysphagia continue tube feedings  Chronic systolic chf ef 29-52 per cent severe LVH  anasaraca due to liver disease and chf  No fever documented in last 24 hours  Patient appears  appropriate for hospice but wife continues to want aggressive therapy. Dr. Al Corpus is his primary hematologist oncologist and will see patient tomorrow        I have discussed the above stated plan with the patient and they verbalized understanding and agreed with the plan. Thank you for allowing [place] to participate in this patients care.    Darlyn Read, MD, 09/17/2014, 7:53 AM

## 2014-09-17 NOTE — Progress Notes (Signed)
[facility]  Gastroenterology Progress Note  Andrew [name] is a 74 y.o. male patient.  1. Closed displaced fracture of left talus, unspecified portion of talus, initial encounter    2. Hypotension, unspecified hypotension type    3. Elevated troponin    4. Cirrhosis, nonalcoholic ([place])    5. Myelodysplasia (myelodysplastic syndrome) (HCC)        SUBJECTIVE:       Unchanged.  Somnolent.      Physical    VITALS:    Visit Vitals   ??? BP 103/68   ??? Pulse 85   ??? Temp 96.9 ??F (36.1 ??C) (Axillary)   ??? Resp 17   ??? Ht 5\' 5"  (1.651 m)   ??? Wt 174 lb 12.8 oz (79.3 kg)   ??? SpO2 91%   ??? BMI 29.09 kg/m2     TEMPERATURE:  Current - Temp: 96.9 ??F (36.1 ??C); Max - Temp  Avg: 97.4 ??F (36.3 ??C)  Min: 96.4 ??F (35.8 ??C)  Max: 97.9 ??F (36.6 ??C)    Abdomen soft, ND, NT, no HSM, Bowel sounds normal   Somnolent,  Does not respond to questions.       Data      Recent Labs      09/15/14   0538  09/16/14   0521  09/17/14   0529   WBC  2.2*  2.5*  2.5*   HGB  7.3*  7.8*  7.5*   HCT  23.6*  24.0*  22.9*   MCV  100.9*  97.2  99.4   PLT  43*  44*  42*     Recent Labs      09/15/14   0538  09/16/14   0521  09/17/14   0529   NA  157*  153*  152*   K  4.2  4.1  4.4   CL  126*  121*  120*   CO2  26  28  27    BUN  47*  46*  43*   CREATININE  1.1  1.0  0.7*       ASSESSMENT ??  1. Cirrhosis  2. C. Diff improved  3. Op dysphagia -has peg. ??  4. Encephalopathy- Na improved but still high.   ??  ??  ??  PLAN ??  1. Correct Na  2. Cont TF  3. Would not place a PEG at this point until Na corrected and other causes of MS changes r/o. He is high risk for PEG complications including bleeding. ??  ??  Burnell Blanks, MD  [place]  ??  ??   ??      ?? ??   ??

## 2014-09-17 NOTE — Progress Notes (Signed)
Spoke with Dr. Shearon Stalls. MD states it is ok to take pt. Off continuous telemetry monitoring.

## 2014-09-17 NOTE — Progress Notes (Signed)
Department of Internal Medicine  Nephrology Attending Progress Note        SUBJECTIVE:  We are following this patient for Hypernatremia . Patient progress reviewed.   Wife by his side     Physical Exam:    VITALS:    Visit Vitals   ??? BP 103/68   ??? Pulse 85   ??? Temp 96.9 ??F (36.1 ??C) (Axillary)   ??? Resp 17   ??? Ht 5\' 5"  (1.651 m)   ??? Wt 174 lb 12.8 oz (79.3 kg)   ??? SpO2 91%   ??? BMI 29.09 kg/m2     BLOOD PRESSURE RANGE:  Systolic (28UXL), KGM:01 , Min:80 , UUV:253   ; Diastolic (66YQI), HKV:42, Min:55, Max:70    24HR INTAKE/OUTPUT:      Intake/Output Summary (Last 24 hours) at 09/17/14 1151  Last data filed at 09/17/14 0439   Gross per 24 hour   Intake   3398 ml   Output    760 ml   Net   2638 ml       Constitutional:  Opens eyes , moving his head   Pallor +   Cardiovascular/Edema:  Regular heart sounds , no murmur   Respiratory:  CTA  Gastrointestinal:   Soft, bowel sounds +   Other:  Edema +     DATA:    CBC with Differential:    Lab Results   Component Value Date    WBC 2.5 09/17/2014    WBC 2.6 08/30/2014    RBC 2.31 09/17/2014    RBC 2.96 08/30/2014    HGB 7.5 09/17/2014    HCT 22.9 09/17/2014    PLT 42 09/17/2014    MCV 99.4 09/17/2014    MCH 32.6 09/17/2014    MCHC 32.8 09/17/2014    RDW 18.8 09/17/2014    NRBC 11 09/16/2014    SEGSPCT 42.6 05/14/2011    BANDSPCT 10 09/16/2014    METASPCT 4 09/02/2014    LYMPHOPCT 42.0 09/16/2014    LYMPHOPCT 41.9 08/30/2014    MONOPCT 12.0 09/16/2014    MYELOPCT 5 09/02/2014    EOSPCT 2.1 05/14/2010    BASOPCT 0.0 09/16/2014    MONOSABS 0.3 09/16/2014    LYMPHSABS 1.0 09/16/2014    EOSABS 0.0 09/16/2014    BASOSABS 0.0 09/16/2014    DIFFTYPE Scan-K 05/14/2011     CMP:    Lab Results   Component Value Date    NA 152 09/17/2014    K 4.4 09/17/2014    CL 120 09/17/2014    CO2 27 09/17/2014    BUN 43 09/17/2014    CREATININE 0.7 09/17/2014    GFRAA >60 09/17/2014    GFRAA >60 05/14/2011    AGRATIO 0.5 09/12/2014    LABGLOM >60 09/17/2014    GLUCOSE 194 09/17/2014    PROT 5.3  09/12/2014    PROT 6.0 05/14/2011    LABALBU 1.8 09/12/2014    CALCIUM 8.2 09/17/2014    BILITOT 2.1 09/12/2014    ALKPHOS 98 09/12/2014    AST 47 09/12/2014    ALT 27 09/12/2014     Uric Acid:  No components found for: URIC  U/A:    Lab Results   Component Value Date    NITRITE neg 10/18/2013    COLORU DK YELLOW 09/02/2014    COLORU yellow 10/18/2013    PHUR 5.0 09/02/2014    PHUR 5.0 10/18/2013    WBCUA 1 09/02/2014    RBCUA 10 09/02/2014  CLARITYU CLOUDY 09/02/2014    CLARITYU clear 10/18/2013    SPECGRAV 1.014 09/02/2014    SPECGRAV 1.010 10/18/2013    LEUKOCYTESUR Negative 09/02/2014    LEUKOCYTESUR neg 10/18/2013    UROBILINOGEN 1.0 09/02/2014    BILIRUBINUR Negative 09/02/2014    BILIRUBINUR neg 10/18/2013    BLOODU SMALL 09/02/2014    BLOODU neg 10/18/2013    GLUCOSEU Negative 09/02/2014    GLUCOSEU neg 10/18/2013         IMPRESSION/RECOMMENDATIONS:      Patient Active Problem List   Diagnosis   ??? Erectile dysfunction   ??? Thrombocytopenia ([place])   ??? Edema   ??? BPH (benign prostatic hyperplasia)   ??? Alpha-1-antitrypsin deficiency   ??? Cirrhosis, nonalcoholic ([place])   ??? Mild CAD   ??? Chronic systolic CHF (congestive heart failure) ([place])   ??? Non-ischemic cardiomyopathy ([place])   ??? Lumbar compression fracture ([place])   ??? Chronic low back pain   ??? Osteopenia   ??? Coagulopathy ([place])   ??? Hypotension   ??? Abnormal EKG   ??? Visual changes   ??? Fixed pupil of right eye   ??? Anemia   ??? Hemispheric carotid artery syndrome   ??? CAD in native artery   ??? Hyperlipidemia   ??? 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 Bronchiectasis without complication ([place])   ??? Benign essential HTN   ??? Metabolic encephalopathy   ??? Ataxia   ??? Pancytopenia ([place])   ??? DVT (deep venous thrombosis) ([place])   ??? Myelodysplasia (myelodysplastic syndrome) ([place])   ??? Pneumonia   ??? Traumatic hematoma of buttock   ??? Pneumonia of both lungs due to infectious organism   ??? Hematoma   ??? Closed displaced fracture of left talus        Diagnosis and Plan :   1. Hypernatremia - lower  at 152   2. Myelodysplasia   3. Non ischemic cardiomyopathy

## 2014-09-17 NOTE — Progress Notes (Signed)
Pt seen attempting to pull at NG tube. Repositioned pt in bed and reinforced teaching on NG tube.

## 2014-09-17 NOTE — Progress Notes (Signed)
Shift assessment complete. meds administered as ordered. VSS.  Pt. In bed resting with eyes closed. Call light in reach  Family at bedside

## 2014-09-17 NOTE — Progress Notes (Signed)
Speech Language/Pathology  Dysphagia Treatment Note    Name:  Andrew [name]  DOB:   [birth year]  Medical Diagnosis:  Hypotension [I95.9]  Treatment Diagnosis: Oropharyngeal Dysphagia  Pain level: Pt did not respond to pain questions    Current Diet Level: NPO with nutrition via NG tube feedings    Tolerance of Recommended Diet Level:  Per chart, Pt is tolerating tube feedings    Assessment of Texture Tolerance: Pt was positioned upright in bed, he had intermittent moments of eye opening and occasional attempts at speaking however his speech was largely unintelligible.  Pt was unable to follow functional commands during session this date.  Pt was orally defensive when therapist attempted to place toothette and spoon in his mouth, Pt was noted to bite/clench his teeth.  Pt did not produce a swallow this date despite attempts to elicit a swallow.  No PO trials were provided due to Pt's decreased alertness and high risk for aspiration.      Plan:  Continued daily Dysphagia treatment with goals per plan of care.    Recommendations:  1.) Continue NPO with alternative means of nutrition/hydration. Recommend frequent oral care.   2)Continue recommended oral motor/bolus control exercises, and DPNS treatments per clinical swallow eval Plan of Care.    Patient/Family Education:Education given to the Pt (no evidence of learning) and his wife, who verbalized understanding.    Discharge Recommendations:  SNF with Speech Therapy for Speech/Dysphagia treatment at discharge.    Timed Code Treatment:  0 minutes     Total Treatment Time:  13 minutes     Faythe Casa, Andrew [place]., [place]  Speech-Language Pathologist

## 2014-09-17 NOTE — Progress Notes (Signed)
Department of Internal Medicine  General Internal Medicine   Progress Note      SUBJECTIVE: very weak , lethargic and encephalopathic     History obtained from chart review and the patient  General ROS: positive for  - fatigue and malaise  negative for - chills, fever or night sweats  Psychological ROS: negative  Respiratory ROS: no cough, shortness of breath, or wheezing  Cardiovascular ROS: no chest pain or dyspnea on exertion  Gastrointestinal ROS: no abdominal pain, change in bowel habits, or black or bloody stools  Genito-Urinary ROS: no dysuria, trouble voiding, or hematuria  Musculoskeletal ROS: positive for - foot pain   negative for - joint swelling    OBJECTIVE      Medications      Current Facility-Administered Medications: [START ON 10/09/2014] darbepoetin alfa-polysorbate SOLN 300 mcg, 300 mcg, Subcutaneous, Q7 Days  ferrous sulfate 300 (60 FE) MG/5ML syrup 300 mg, 300 mg, Per NG tube, Daily  ibuprofen (ADVIL;MOTRIN) tablet 400 mg, 400 mg, Oral, Q6H PRN  acetaminophen (TYLENOL) 160 MG/5ML solution 650 mg, 650 mg, Oral, Q4H PRN  dextrose 5 % solution, , Intravenous, Continuous  lidocaine (XYLOCAINE) 2 % jelly, , Topical, PRN  cefUROXime (CEFTIN) tablet 500 mg, 500 mg, Oral, 2 times per day  acetaminophen (TYLENOL) suppository 650 mg, 650 mg, Rectal, Q4H PRN  haloperidol lactate (HALDOL) injection 2 mg, 2 mg, Intramuscular, Q6H PRN  sodium chloride flush 0.9 % injection 10 mL, 10 mL, Intravenous, 2 times per day  vancomycin (VANCOCIN) oral solution 250 mg, 250 mg, Oral, 3 times per day  carvedilol (COREG) tablet 3.125 mg, 3.125 mg, Oral, BID  vitamin D (CHOLECALCIFEROL) tablet 1,000 Units, 1,000 Units, Oral, Daily  citalopram (CELEXA) tablet 10 mg, 10 mg, Oral, Daily  magnesium oxide (MAG-OX) tablet 400 mg, 400 mg, Oral, BID  therapeutic multivitamin-minerals 1 tablet, 1 tablet, Oral, Daily  promethazine (PHENERGAN) tablet 12.5 mg, 12.5 mg, Oral, Q6H PRN  rifaximin (XIFAXAN) tablet 550 mg, 550 mg, Oral,  BID  saccharomyces boulardii (FLORASTOR) capsule 250 mg, 250 mg, Oral, BID  traMADol (ULTRAM) tablet 50 mg, 50 mg, Oral, Q12H PRN  vitamin B-12 (CYANOCOBALAMIN) tablet 50 mcg, 50 mcg, Oral, Daily    Physical      VITALS:    Visit Vitals   ??? BP 96/64   ??? Pulse 91   ??? Temp 97.1 ??F (36.2 ??C) (Axillary)   ??? Resp 20   ??? Ht 5\' 5"  (1.651 m)   ??? Wt 174 lb 12.8 oz (79.3 kg)   ??? SpO2 92%   ??? BMI 29.09 kg/m2     TEMPERATURE:  Current - Temp: 97.1 ??F (36.2 ??C); Max - Temp  Avg: 98.2 ??F (36.8 ??C)  Min: 97.1 ??F (36.2 ??C)  Max: 99.9 ??F (37.7 ??C)  RESPIRATIONS RANGE: Resp  Avg: 17.5  Min: 16  Max: 20  PULSE RANGE: Pulse  Avg: 84.8  Min: 77  Max: 91  BLOOD PRESSURE RANGE:  Systolic (59DGL), OVF:64 , Min:83 , PPI:951   ; Diastolic (88CZY), SAY:30, Min:50, Max:78    PULSE OXIMETRY RANGE: SpO2  Avg: 91 %  Min: 90 %  Max: 92 %  24HR INTAKE/OUTPUT:      Intake/Output Summary (Last 24 hours) at 09/18/14 0856  Last data filed at 09/18/14 1601   Gross per 24 hour   Intake   2220 ml   Output    755 ml   Net   1465 ml  CONSTITUTIONAL:  fatigued, alert, uncooperative, distracted, mild distress and appears stated age  NECK:  Mild JVD   BACK:  wnl  LUNGS:  No increased work of breathing, good air exchange, clear to auscultation bilaterally, no crackles or wheezing  CARDIOVASCULAR:  Normal apical impulse, regular rate and rhythm, normal S1 and S2, no S3 or S4, and no murmur noted  ABDOMEN:  Soft non tender BS +  MUSCULOSKELETAL:  Tenderness left foot  NEUROLOGIC:  No acute focal     Data      No results found for: PHART, PO2ART, PCO2ART, HCO3ART, BEART, O2SATART    Lab Results   Component Value Date    NA 148 09/18/2014    K 4.6 09/18/2014    CL 116 09/18/2014    CO2 27 09/18/2014    BUN 45 09/18/2014    CREATININE 0.7 09/18/2014    GLUCOSE 197 09/18/2014    CALCIUM 8.0 09/18/2014     Lab Results   Component Value Date    WBC 2.2 09/18/2014    WBC 2.6 08/30/2014    HGB 7.7 09/18/2014    HCT 24.0 09/18/2014    MCV 101.8 09/18/2014    PLT 33  09/18/2014         Lab Results   Component Value Date    INR 1.41 (H) 09/02/2014    PROTIME 16.2 (H) 09/02/2014       ASSESSMENT AND PLAN      Patient Active Problem List   Diagnosis   ??? Erectile dysfunction   ??? Thrombocytopenia ([place])   ??? Edema   ??? BPH (benign prostatic hyperplasia)   ??? Alpha-1-antitrypsin deficiency   ??? Cirrhosis, nonalcoholic ([place])   ??? Mild CAD   ??? Chronic systolic CHF (congestive heart failure) ([place])   ??? Non-ischemic cardiomyopathy ([place])   ??? Lumbar compression fracture ([place])   ??? Chronic low back pain   ??? Osteopenia   ??? Coagulopathy ([place])   ??? Hypotension   ??? Abnormal EKG   ??? Visual changes   ??? Fixed pupil of right eye   ??? Anemia   ??? Hemispheric carotid artery syndrome   ??? CAD in native artery   ??? Hyperlipidemia   ??? 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 Bronchiectasis without complication ([place])   ??? Benign essential HTN   ??? Metabolic encephalopathy   ??? Ataxia   ??? Pancytopenia ([place])   ??? DVT (deep venous thrombosis) ([place])   ??? Myelodysplasia (myelodysplastic syndrome) ([place])   ??? Pneumonia   ??? Traumatic hematoma of buttock   ??? Pneumonia of both lungs due to infectious organism   ??? Hematoma   ??? Closed displaced fracture of left talus     15 days of hospitalization has not yielded any promising improvement , I am glad he is DNR CC but if Hematology service agrees I would like to see to  strongly advocate to family for Hospice service

## 2014-09-17 NOTE — Progress Notes (Signed)
Complete bed bath done. Moderate BM in brief. Pt repositioned in bed. Scheduled vancomycin given. No signs of distress noted. Bed alarm on, call light in reach. Will continue to monitor.

## 2014-09-17 NOTE — Progress Notes (Signed)
Bedside report completed with [place], Therapist, sports. Electronically signed by Dutch Gray, RN on 09/17/2014 at 7:25 AM

## 2014-09-17 NOTE — Progress Notes (Signed)
Occupational Therapy  Facility/Department: [facility]  Daily Treatment Note  NAME: Nekhi Liwanag  DOB: March 16, 1940  MRN: [id number]    Date of Service: 09/17/2014    Patient Diagnosis(es):   Patient Active Problem List    Diagnosis Date Noted   ??? Erectile dysfunction 04/30/2009     Priority: Low   ??? Closed displaced fracture of left talus 09/03/2014   ??? Hematoma 07/31/2014   ??? Pneumonia of both lungs due to infectious organism    ??? Traumatic hematoma of buttock    ??? Pneumonia 07/25/2014   ??? 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 DVT (deep venous thrombosis) (HCC) 07/10/2014   ??? Metabolic encephalopathy 07/07/2014   ??? Ataxia 07/07/2014   ??? Pancytopenia (HCC) 07/07/2014   ??? Benign essential HTN 07/03/2014   ??? Bronchiectasis without complication (HCC) 06/26/2014   ??? 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 Anemia 04/02/2014   ??? Hemispheric carotid artery syndrome    ??? CAD in native artery    ??? Hyperlipidemia    ??? Hypotension 04/01/2014   ??? Abnormal EKG 04/01/2014   ??? Visual changes 04/01/2014   ??? Lumbar compression fracture (HCC) 01/14/2014   ??? Chronic low back pain 01/14/2014   ??? Osteopenia 01/14/2014   ??? Coagulopathy (HCC) 01/14/2014   ??? 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 Chronic systolic CHF (congestive heart failure) (HCC) 10/18/2013   ??? Mild CAD    ??? Cirrhosis, nonalcoholic (HCC)    ??? 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 BPH (benign prostatic hyperplasia) 08/01/2009   ??? Thrombocytopenia (HCC) 04/30/2009   ??? Edema 04/30/2009       Past Medical History   Diagnosis Date   ??? Allergic    ??? Alpha 1 antitrypsin deficiency    ??? Anemia    ??? Arthritis    ??? BPH (benign prostatic hyperplasia)    ??? CAD (coronary artery disease)      Non-obstructive   ??? Cancer (HCC)    ??? Chronic systolic CHF (congestive heart failure) (HCC) 10/18/2013   ??? 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 Clostridium difficile diarrhea  09/04/2014   ??? Compression fracture of lumbar vertebra (HCC)      multiple, lumbar   ??? DVT (deep venous thrombosis) (HCC)    ??? Glaucoma    ??? Hyperlipidemia    ??? Hypertension    ??? Kidney stones    ??? Leukopenia    ??? Liver disease    ??? 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 Pancytopenia (HCC)    ??? Pneumonia    ??? Thrombocytopenia (HCC) 04/30/2009     Past Surgical History   Procedure Laterality Date   ??? Av fistula repair  1970's   ??? Colonoscopy  4/12     5y   ??? Cardiac catheterization     ??? Eye surgery  04/12/2014     Laser Eye Surgery for glaucoma   ??? Cosmetic surgery  04/2013     eye lids   ??? Bone marrow biopsy     ??? Knee surgery         Restrictions  Restrictions/Precautions  Restrictions/Precautions: Fall Risk, Weight Bearing, Isolation  Lower Extremity Weight Bearing Restrictions  Right Lower Extremity Weight Bearing: Weight Bearing As Tolerated  Left Lower Extremity Weight Bearing: Non Weight Bearing  Position Activity Restriction  Other position/activity restrictions:  C-diff precautions; NG tube  Subjective   General  Chart Reviewed: Yes  Additional Pertinent Hx: BPH, CAD, CA, Lumbar compression fx, HTN, Myelodysplasia  Response to previous treatment:  (pt unable to report; NSG requested EOB activity only d/t pt's low Hemoglobin)  Family / Caregiver Present: Yes (multiple family members/friends present and exited room in beginning of session)  Diagnosis: Fall, L talus fx  Subjective  Subjective: Pt supine in bed upon arrival, pt found to be scooting down in bed. Pt awakened to name. Pt formulated some words but difficulty to understand. Pt inconsistently shaking head yes/no to questions. Pt agreeable to attempt EOB activity.  Pre Treatment Pain Screening  Intervention List: Patient able to continue with treatment  Pain Assessment  Pain Assessment: Faces  Wong-Baker Pain Rating: Hurts even more (pt grimaced with movement)  Pain Intervention(s): Repositioned (pt repositioned in bed at end of  session)   Orientation  Orientation  Overall Orientation Status: Impaired  Orientation Level: Oriented to person (pt responded to name)  Objective    ADL  LE Dressing: Dependent/Total (to don R sock)  Toileting: Dependent/Total (foley catheter in place)  Additional Comments: Pt scooted down in bed. Pt initiated bringing BLEs to EOB after cueing but required assist to bring off EOB. Rolling to L side to prepare for supine>sit transfer, max A of 2. Pt demo'd increased signs and symptoms of pain, therefore, pt was returned to supine in bed for comfort. Rolling to R side in bed for repositioning, max A. Scooting up in bed, dep of 2. Pt left lying supine in bed, slightly side lying on to L side, pillows under each extremity and underneath calves. Pt left with needs in reach.           Bed mobility  Supine to Sit: Dependent/Total (max A of 2; pt unable to come to sitting position)  Sit to Supine: Dependent/Total (max A of 2)                          Cognition  Overall Cognitive Status: Impaired  Arousal/Alertness: Delayed responses to stimuli  Following Directions: Follows one step commands  Attention Span: Attends with cues to redirect  Following Commands: Follows one step commands with repetition  Safety Judgement: Decreased awareness of need for assistance  Sequencing and Organization: Assistance required to identify errors made;Assistance required to generate solutions;Assistance required to implement solutions                                      Assessment   Conditions Requiring Skilled Therapeutic Intervention : Decreased functional mobility ;Decreased ADL status;Decreased strength;Decreased safe awareness;Decreased cognition;Decreased endurance;Decreased high-level IADLs;Decreased balance  Treatment Diagnosis: Decreased ADLs, transfers, activity tolerance associated with fall, talus fx  Prognosis: Fair  Patient Education: Evaluation, safety, sitting tolerance, simple grooming, bed mobility  Discharge  Recommendations: Patient would benefit from continued therapy after discharge;Subacute/Skilled Nursing Facility;24 hour supervision or assist  Requires OT Follow Up: Yes  Timed Code Treatment Minutes: 14 Minutes  Total Treatment Time: 14  Activity Tolerance  Activity Tolerance: Patient limited by fatigue;Treatment limited secondary to decreased cognition  Safety Devices  Safety Devices in place: Yes  Type of devices: All fall risk precautions in place;Bed alarm in place;Call light within reach;Left in bed;Nurse notified  OT Equipment Recommendations  Equipment Needed: No  Discharge Recommendations:  Patient would benefit from continued therapy after discharge, [place], 24 hour supervision or assist     Plan   Plan  Times per week: 2-5x  Times per day: Daily  Specific instructions for Next Treatment: continue cotx with OT d/t requiring 2-skilled therapists to facilitate proper movement for increased independence with mobility  Current Treatment Recommendations: Strengthening, Hotel manager, Engineer, production, Child psychotherapist, Proofreader, Scientist, clinical (histocompatibility and immunogenetics), Self-Care / ADL    G-Code       Short term goals  Time Frame for Short term goals: Discharge  Short term goal 1: Patient will perform bed mobility modA of 1--Dependent, Max A of 2 supine<>sit  Short term goal 2: Patient will tolerate sitting EOB supervision x 10-12 minutes to assist with ADL completion- pt unable to tolerate sitting EOB this date  Short term goal 3: Patient will perform simple grooming tasks minA--not addressed  Short term goal 4: Patient will tolerate lateral, slide board transfer (due to NWB LLE) with maxA of 1 -- not addressed  Long term goals  Time Frame for Long term goals : LTG=STG       Kristine Linea, OT     Kristine Linea, [place], OTR/L [phone]

## 2014-09-17 NOTE — Progress Notes (Signed)
Physical Therapy  Facility/Department: [facility]  Daily Treatment Note/Cotx with OT  NAME: Andrew [name]  DOB: 12/25/1940  MRN: [id number]    Date of Service: 09/17/2014    Patient Diagnosis(es):   Patient Active Problem List    Diagnosis Date Noted   ??? Erectile dysfunction 04/30/2009     Priority: Low   ??? Closed displaced fracture of left talus 09/03/2014   ??? Hematoma 07/31/2014   ??? Pneumonia of both lungs due to infectious organism    ??? Traumatic hematoma of buttock    ??? Pneumonia 07/25/2014   ??? Myelodysplasia (myelodysplastic syndrome) ([place]) 07/19/2014   ??? DVT (deep venous thrombosis) ([place]) 49/70/2637   ??? Metabolic encephalopathy 85/88/5027   ??? Ataxia 07/07/2014   ??? Pancytopenia ([place]) 07/07/2014   ??? Benign essential HTN 07/03/2014   ??? Bronchiectasis without complication ([place]) 74/02/8785   ??? Idiopathic cardiomyopathy ([place]) 05/20/2014   ??? 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 Anemia 04/02/2014   ??? Hemispheric carotid artery syndrome    ??? CAD in native artery    ??? Hyperlipidemia    ??? Hypotension 04/01/2014   ??? Abnormal EKG 04/01/2014   ??? Visual changes 04/01/2014   ??? Lumbar compression fracture ([place]) 01/14/2014   ??? Chronic low back pain 01/14/2014   ??? Osteopenia 01/14/2014   ??? Coagulopathy ([place]) 01/14/2014   ??? Non-ischemic cardiomyopathy ([place]) 11/06/2013   ??? Chronic systolic CHF (congestive heart failure) ([place]) 10/18/2013   ??? Mild CAD    ??? Cirrhosis, nonalcoholic ([place])    ??? 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 BPH (benign prostatic hyperplasia) 08/01/2009   ??? Thrombocytopenia ([place]) 04/30/2009   ??? Edema 04/30/2009       Past Medical History   Diagnosis Date   ??? Allergic    ??? Alpha 1 antitrypsin deficiency    ??? Anemia    ??? Arthritis    ??? BPH (benign prostatic hyperplasia)    ??? CAD (coronary artery disease)      Non-obstructive   ??? Cancer ([place])    ??? Chronic systolic CHF (congestive heart failure) ([place]) 10/18/2013   ??? 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 Clostridium difficile  diarrhea 09/04/2014   ??? Compression fracture of lumbar vertebra (HCC)      multiple, lumbar   ??? DVT (deep venous thrombosis) ([place])    ??? Glaucoma    ??? Hyperlipidemia    ??? Hypertension    ??? Kidney stones    ??? Leukopenia    ??? Liver disease    ??? Myelodysplasia (myelodysplastic syndrome) ([place]) 07/19/2014   ??? Pancytopenia ([place])    ??? Pneumonia    ??? Thrombocytopenia ([place]) 04/30/2009     Past Surgical History   Procedure Laterality Date   ??? Av fistula repair  1970's   ??? Colonoscopy  4/12     5y   ??? Cardiac catheterization     ??? Eye surgery  04/12/2014     Laser Eye Surgery for glaucoma   ??? Cosmetic surgery  04/2013     eye lids   ??? Bone marrow biopsy     ??? Knee surgery       Restrictions  Restrictions/Precautions: Fall Risk, Weight Bearing, Isolation  Right Lower Extremity Weight Bearing: Weight Bearing As Tolerated  Left Lower Extremity Weight Bearing: Non Weight Bearing  Other position/activity restrictions: C-diff precautions; NG tube, RN ok'd PT/OT treatment session  Subjective   Chart Reviewed: Yes  Additional Pertinent Hx: BPH, CAD, CA, Lumbar compression fx, HTN, Myelodysplasia  Response To Previous Treatment: Patient with no complaints from previous session.  Family / Caregiver Present: Yes (Family left during tx session and returned following tx session.)  Subjective: Pt received supine with HOB raised, scooted down in bed, willing to participate with PT/OT.  Comments: Patient in supine, awakened to name, follows commands with delayed response  Pain Assessment: Faces  Pain Level: 6 (Pt grimaced with movement - no actual number given)  Pain Intervention(s): Repositioned;Elevation         Orientation  Overall Orientation Status: Impaired (Pt responded to name, pt lethargic and difficult to understand)     Objective   Bed Mobility  Rolling: Maximal assistance  Supine to Sit: Dependent/Total (maxA of 2 with HOB raised, unable to reach full sitting d/t increased signs and symptoms of pain)  Sit to Supine: Dependent/Total  (maxA of 2)  Scooting: Dependent/Total (dependent of 2; scooting up in bed)    Transfers  Sit to Stand: Unable to assess(comment) (unsafe at this time)                              Assessment   Assessment: Decreased functional mobility ;Decreased strength;Decreased safe awareness;Decreased cognition;Decreased endurance;Decreased balance;Decreased coordination  Assessment: Pt currently not at baseline functioning related to recent dx and would benefit from skilled PT intervention to address stated deficits and facilitate return to PLOF.   Treatment Diagnosis: decreased functional mobility, decreased strength, decreased safety awareness, decreased cognition, decreased endurance, decreased balance, decreased coordination  Specific instructions for Next Treatment: continue cotx with OT d/t requiring 2-skilled therapists to facilitate proper movement for increased independence with mobility  Prognosis: Fair;Guarded  Patient Education: Educated pt and spouse on role of PT, POC, and discharge recommendations.   Requires PT Follow Up: Yes  Total Treatment Time: 14  Timed Code Treatment Minutes: 14 Minutes  Activity Tolerance: Patient limited by fatigue;Patient limited by pain;Patient limited by endurance  PT Equipment Recommendations  Equipment Needed: No     Discharge Recommendations:  [place], Patient would benefit from continued therapy after discharge    Goals  Short term goals  Time Frame for Short term goals: to be met prior to discharge   Short term goal 1: Pt to complete all bed mobility with maxA.   Short term goal 2: Pt to sit EOB x10 minutes with SUPV.   Short term goal 3: Patient will complete lateral, slide board transfer (due to NWB LLE) with maxA of 1.    No PT goals met at this time, all goals remain appropriate.    Plan    Times per week: 2-5x  Times per day: Daily  Specific instructions for Next Treatment: continue cotx with OT d/t requiring 2-skilled therapists to facilitate  proper movement for increased independence with mobility  Current Treatment Recommendations: Strengthening;ROM;Balance Training;Functional Mobility Training;Transfer Training;Engineer, production;Wheelchair Mobility Training;Neuromuscular Re-education;Pain Management;Home Exercise Program;Safety Education & Youth worker, Education, & procurement;Patient/Caregiver Education & Training  Type of devices: All fall risk precautions in place;Bed alarm in place;Call light within reach;Patient at risk for falls;Left in bed;Nurse notified (Family returned to room following tx session)     Jerre Simon, PT, DPT [phone]

## 2014-09-17 NOTE — Progress Notes (Signed)
Pt. VS obtained. Temp slightly elevated. Tylenol administered. Pt. In bed resting with eyes closed. Family at bedside. Call light in reach

## 2014-09-18 LAB — CBC
Hematocrit: 24 % — ABNORMAL LOW (ref 40.5–52.5)
Hemoglobin: 7.7 g/dL — ABNORMAL LOW (ref 13.5–17.5)
MCH: 32.7 pg (ref 26.0–34.0)
MCHC: 32.1 g/dL (ref 31.0–36.0)
MCV: 101.8 fL — ABNORMAL HIGH (ref 80.0–100.0)
MPV: 11.1 fL — ABNORMAL HIGH (ref 5.0–10.5)
PLATELET SLIDE REVIEW: DECREASED
Platelets: 33 10*3/uL — ABNORMAL LOW (ref 135–450)
RBC: 2.35 M/uL — ABNORMAL LOW (ref 4.20–5.90)
RDW: 19.5 % — ABNORMAL HIGH (ref 12.4–15.4)
WBC: 2.2 10*3/uL — ABNORMAL LOW (ref 4.0–11.0)

## 2014-09-18 LAB — AMMONIA: Ammonia: 54 umol/L (ref 16–60)

## 2014-09-18 LAB — BASIC METABOLIC PANEL
Anion Gap: 5 (ref 3–16)
BUN: 45 mg/dL — ABNORMAL HIGH (ref 7–20)
CO2: 27 mmol/L (ref 21–32)
Calcium: 8 mg/dL — ABNORMAL LOW (ref 8.3–10.6)
Chloride: 116 mmol/L — ABNORMAL HIGH (ref 99–110)
Creatinine: 0.7 mg/dL — ABNORMAL LOW (ref 0.8–1.3)
GFR African American: >60 (ref >60)
GFR Non-African American: >60 (ref >60)
Glucose: 197 mg/dL — ABNORMAL HIGH (ref 70–99)
Potassium: 4.6 mmol/L (ref 3.5–5.1)
Sodium: 148 mmol/L — ABNORMAL HIGH (ref 136–145)

## 2014-09-18 MED FILL — MAGNESIUM OXIDE 400 (241.3 MG) MG PO TABS: 400 (241.3 Mg) MG | ORAL | Qty: 1

## 2014-09-18 MED FILL — FLORASTOR 250 MG PO CAPS: 250 MG | ORAL | Qty: 1

## 2014-09-18 MED FILL — CITALOPRAM HYDROBROMIDE 20 MG PO TABS: 20 MG | ORAL | Qty: 1

## 2014-09-18 MED FILL — FERROUS SULFATE 300 (60 FE) MG/5ML PO SYRP: 300 (60 Fe) MG/5ML | ORAL | Qty: 5

## 2014-09-18 MED FILL — VITAMIN D3 25 MCG (1000 UT) PO TABS: 25 MCG (1000 UT) | ORAL | Qty: 1

## 2014-09-18 MED FILL — XIFAXAN 550 MG PO TABS: 550 MG | ORAL | Qty: 1

## 2014-09-18 MED FILL — CARVEDILOL 3.125 MG PO TABS: 3.125 MG | ORAL | Qty: 1

## 2014-09-18 MED FILL — VANCOMYCIN 50 MG/ML ORAL SOLUTION: 50 mg/mL | ORAL | Qty: 5

## 2014-09-18 MED FILL — CEFUROXIME AXETIL 250 MG PO TABS: 250 MG | ORAL | Qty: 2

## 2014-09-18 MED FILL — THERAPEUTIC MULTIVIT/MINERAL PO TABS: ORAL | Qty: 1

## 2014-09-18 MED FILL — VITAMIN B-12 100 MCG PO TABS: 100 MCG | ORAL | Qty: 1

## 2014-09-18 NOTE — Progress Notes (Signed)
Speech Language Pathology    Spoke with RN regarding dysphagia therapy, per RN Pt continues with reduced alertness.  Will hold dysphagia therapy this date due to level of alertness and Pt's overall medical status.     Thank you,  Faythe Casa, M.A., [place]  Speech-Language Pathologist

## 2014-09-18 NOTE — Progress Notes (Signed)
Pt repositioned in bed. Family at bedside.

## 2014-09-18 NOTE — Progress Notes (Signed)
Large, soft BM noted in brief. Pt repositioned and full bath and linen change provided. Arms wrapped in kerlex due to weeping of BUE. Routine VSS. No signs of distress noted. Bed alarm on, call light in reach. Will continue to monitor.

## 2014-09-18 NOTE — Progress Notes (Signed)
Shift assessment complete.  Routine VSS.  Patient noted to be non-responsive.  Patient does not appear agitated or restless at this time.  Patient does not appear to have pain at this time.  Patient appears comfortable.  Family denies needs at this time.  Call light in reach and bed alarm in use.Electronically signed by Dede Query, RN on 09/18/2014 at 8:46 PM

## 2014-09-18 NOTE — Progress Notes (Signed)
Pt resting quietly in bed. Respirations even and unlabored. Bed alarm on, call light in reach. Will continue to monitor.

## 2014-09-18 NOTE — Progress Notes (Signed)
Patient incontinent of stool.  Changed and peri care provided.  Patient repositioned.  Call light in reach and bed alarm in use.Electronically signed by Dede Query, RN on 09/18/2014 at 10:42 PM

## 2014-09-18 NOTE — Progress Notes (Signed)
All of pt's medications crushed and given through NG tube. NG tube will now not flush. Clog zapper instilled. Will wait an hour and recheck. VSS. Pt repositioned in bed. Will continue to monitor.

## 2014-09-18 NOTE — Progress Notes (Signed)
NG still not flushing. Will administer more clog zapper and let tube site for another 30 minutes.

## 2014-09-18 NOTE — Progress Notes (Signed)
Department of Internal Medicine  General Internal Medicine   Progress Note      SUBJECTIVE: clinically unimproved or worse     History obtained from chart review and the patient  General ROS: positive for  - fatigue and malaise  negative for - chills, fever or night sweats  Psychological ROS: negative  Respiratory ROS: no cough, shortness of breath, or wheezing  Cardiovascular ROS: no chest pain or dyspnea on exertion  Gastrointestinal ROS: no abdominal pain, change in bowel habits, or black or bloody stools  Genito-Urinary ROS: no dysuria, trouble voiding, or hematuria  Musculoskeletal ROS: positive for - foot pain   negative for - joint swelling    OBJECTIVE      Medications      Current Facility-Administered Medications: ferrous sulfate 300 (60 FE) MG/5ML syrup 300 mg, 300 mg, Per NG tube, Daily  ibuprofen (ADVIL;MOTRIN) tablet 400 mg, 400 mg, Oral, Q6H PRN  acetaminophen (TYLENOL) 160 MG/5ML solution 650 mg, 650 mg, Oral, Q4H PRN  dextrose 5 % solution, , Intravenous, Continuous  lidocaine (XYLOCAINE) 2 % jelly, , Topical, PRN  cefUROXime (CEFTIN) tablet 500 mg, 500 mg, Oral, 2 times per day  acetaminophen (TYLENOL) suppository 650 mg, 650 mg, Rectal, Q4H PRN  haloperidol lactate (HALDOL) injection 2 mg, 2 mg, Intramuscular, Q6H PRN  sodium chloride flush 0.9 % injection 10 mL, 10 mL, Intravenous, 2 times per day  vancomycin (VANCOCIN) oral solution 250 mg, 250 mg, Oral, 3 times per day  carvedilol (COREG) tablet 3.125 mg, 3.125 mg, Oral, BID  vitamin D (CHOLECALCIFEROL) tablet 1,000 Units, 1,000 Units, Oral, Daily  citalopram (CELEXA) tablet 10 mg, 10 mg, Oral, Daily  magnesium oxide (MAG-OX) tablet 400 mg, 400 mg, Oral, BID  therapeutic multivitamin-minerals 1 tablet, 1 tablet, Oral, Daily  promethazine (PHENERGAN) tablet 12.5 mg, 12.5 mg, Oral, Q6H PRN  rifaximin (XIFAXAN) tablet 550 mg, 550 mg, Oral, BID  saccharomyces boulardii (FLORASTOR) capsule 250 mg, 250 mg, Oral, BID  traMADol (ULTRAM) tablet 50 mg,  50 mg, Oral, Q12H PRN  vitamin B-12 (CYANOCOBALAMIN) tablet 50 mcg, 50 mcg, Oral, Daily    Physical      VITALS:    Visit Vitals   ??? BP 93/63   ??? Pulse 91   ??? Temp 98 ??F (36.7 ??C)   ??? Resp 14   ??? Ht 5\' 5"  (1.651 m)   ??? Wt 174 lb 12.8 oz (79.3 kg)   ??? SpO2 90%   ??? BMI 29.09 kg/m2     TEMPERATURE:  Current - Temp: 98 ??F (36.7 ??C); Max - Temp  Avg: 97.7 ??F (36.5 ??C)  Min: 97.1 ??F (36.2 ??C)  Max: 98 ??F (36.7 ??C)  RESPIRATIONS RANGE: Resp  Avg: 17.2  Min: 14  Max: 20  PULSE RANGE: Pulse  Avg: 86.6  Min: 77  Max: 91  BLOOD PRESSURE RANGE:  Systolic (54UJW), JXB:14 , Min:83 , NWG:956   ; Diastolic (21HYQ), MVH:84, Min:50, Max:68    PULSE OXIMETRY RANGE: SpO2  Avg: 90.6 %  Min: 90 %  Max: 92 %  24HR INTAKE/OUTPUT:      Intake/Output Summary (Last 24 hours) at 09/18/14 2214  Last data filed at 09/18/14 1419   Gross per 24 hour   Intake   1480 ml   Output    575 ml   Net    905 ml     CONSTITUTIONAL:  fatigued, alert, uncooperative, distracted, mild distress and appears stated age  NECK:  Mild  JVD   BACK:  wnl  LUNGS:  No increased work of breathing, good air exchange, clear to auscultation bilaterally, no crackles or wheezing  CARDIOVASCULAR:  Normal apical impulse, regular rate and rhythm, normal S1 and S2, no S3 or S4, and no murmur noted  ABDOMEN:  Soft non tender BS +  MUSCULOSKELETAL:  Tenderness left foot  NEUROLOGIC:  No acute focal     Data      No results found for: Rebecka Apley, BEART, O2SATART    Lab Results   Component Value Date    NA 148 09/18/2014    K 4.6 09/18/2014    CL 116 09/18/2014    CO2 27 09/18/2014    BUN 45 09/18/2014    CREATININE 0.7 09/18/2014    GLUCOSE 197 09/18/2014    CALCIUM 8.0 09/18/2014     Lab Results   Component Value Date    WBC 2.2 09/18/2014    WBC 2.6 08/30/2014    HGB 7.7 09/18/2014    HCT 24.0 09/18/2014    MCV 101.8 09/18/2014    PLT 33 09/18/2014         Lab Results   Component Value Date    INR 1.41 (H) 09/02/2014    PROTIME 16.2 (H) 09/02/2014        ASSESSMENT AND PLAN      Patient Active Problem List   Diagnosis   ??? Erectile dysfunction   ??? Thrombocytopenia ([place])   ??? Edema   ??? BPH (benign prostatic hyperplasia)   ??? Alpha-1-antitrypsin deficiency   ??? Cirrhosis, nonalcoholic ([place])   ??? Mild CAD   ??? Chronic systolic CHF (congestive heart failure) ([place])   ??? Non-ischemic cardiomyopathy ([place])   ??? Lumbar compression fracture ([place])   ??? Chronic low back pain   ??? Osteopenia   ??? Coagulopathy ([place])   ??? Hypotension   ??? Abnormal EKG   ??? Visual changes   ??? Fixed pupil of right eye   ??? Anemia   ??? Hemispheric carotid artery syndrome   ??? CAD in native artery   ??? Hyperlipidemia   ??? Idiopathic cardiomyopathy ([place])   ??? Bronchiectasis without complication ([place])   ??? Benign essential HTN   ??? Metabolic encephalopathy   ??? Ataxia   ??? Pancytopenia ([place])   ??? DVT (deep venous thrombosis) ([place])   ??? Myelodysplasia (myelodysplastic syndrome) ([place])   ??? Pneumonia   ??? Traumatic hematoma of buttock   ??? Pneumonia of both lungs due to infectious organism   ??? Hematoma   ??? Closed displaced fracture of left talus     Moderate improvement in sodium level has done nothing for the mental status , will check ammonia level  Explained to patient's family present level of care is morally not fare to the patient , it involves pain and discomfort to the patient  And has no promising cure to the patient in return , they agreed with me , I hope they accept the concept of Hospice care

## 2014-09-18 NOTE — Progress Notes (Signed)
Bedside report completed with Darden Dates, RN. Electronically signed by Dutch Gray, RN on 09/18/2014 at 7:17 AM

## 2014-09-18 NOTE — Progress Notes (Signed)
Palliative Care:     Family agrees to meet with Myrtie Hawk RN from [facility]. Son [name] states "It's time to bring my father home." [facility] referral has been called and faxed. Discussed with Web designer and Myrtie Hawk from [facility].

## 2014-09-18 NOTE — Progress Notes (Signed)
NG tube still clogged. This RN attempted to use clog zapper twice without success. Dr. Clifton James suggested tube brush to clear the pathway for patent tube. Endo RN came up to floor and attempted to brush out tube to clear pathway. No success. This RN spoke with pt's family and they want to place another tube. Dr. Clifton James paged.

## 2014-09-18 NOTE — Progress Notes (Signed)
Palliative Care:     Patient lethargic. Spouse and daughter at bedside. Wife tearful at times. Discussed goals of care. Explained hospice benefit and philosophy. Hospice choices reviewed. 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 Wife states she wants to "talk things over" with her family before making a decision to meet with hospice. Offered support.

## 2014-09-18 NOTE — Progress Notes (Signed)
This RN called Respiratory for [facility]. Stated they were aware and will be up.

## 2014-09-18 NOTE — Progress Notes (Signed)
Department of Internal Medicine  Nephrology Attending Progress Note        SUBJECTIVE:  We are following this patient for Hypernatremia . Patient progress reviewed.   Wife by his side     Physical Exam:    VITALS:    Visit Vitals   ??? BP 96/64   ??? Pulse 91   ??? Temp 97.1 ??F (36.2 ??C) (Axillary)   ??? Resp 20   ??? Ht 5\' 5"  (1.651 m)   ??? Wt 174 lb 12.8 oz (79.3 kg)   ??? SpO2 92%   ??? BMI 29.09 kg/m2     BLOOD PRESSURE RANGE:  Systolic (69SWN), IOE:70 , Min:83 , JJK:093   ; Diastolic (81WEX), HBZ:16, Min:50, Max:78    24HR INTAKE/OUTPUT:      Intake/Output Summary (Last 24 hours) at 09/18/14 1042  Last data filed at 09/18/14 0621   Gross per 24 hour   Intake   2220 ml   Output    755 ml   Net   1465 ml       Constitutional:  Opens eyes , moving his head   Pallor +   Cardiovascular/Edema:  Regular heart sounds , no murmur   Respiratory:  CTA  Gastrointestinal:   Soft, bowel sounds +   Other:  Edema +     DATA:    CBC with Differential:    Lab Results   Component Value Date    WBC 2.2 09/18/2014    WBC 2.6 08/30/2014    RBC 2.35 09/18/2014    RBC 2.96 08/30/2014    HGB 7.7 09/18/2014    HCT 24.0 09/18/2014    PLT 33 09/18/2014    MCV 101.8 09/18/2014    MCH 32.7 09/18/2014    MCHC 32.1 09/18/2014    RDW 19.5 09/18/2014    NRBC 11 09/16/2014    SEGSPCT 42.6 05/14/2011    BANDSPCT 10 09/16/2014    METASPCT 4 09/02/2014    LYMPHOPCT 42.0 09/16/2014    LYMPHOPCT 41.9 08/30/2014    MONOPCT 12.0 09/16/2014    MYELOPCT 5 09/02/2014    EOSPCT 2.1 05/14/2010    BASOPCT 0.0 09/16/2014    MONOSABS 0.3 09/16/2014    LYMPHSABS 1.0 09/16/2014    EOSABS 0.0 09/16/2014    BASOSABS 0.0 09/16/2014    DIFFTYPE Scan-K 05/14/2011     CMP:    Lab Results   Component Value Date    NA 148 09/18/2014    K 4.6 09/18/2014    CL 116 09/18/2014    CO2 27 09/18/2014    BUN 45 09/18/2014    CREATININE 0.7 09/18/2014    GFRAA >60 09/18/2014    GFRAA >60 05/14/2011    AGRATIO 0.5 09/12/2014    LABGLOM >60 09/18/2014    GLUCOSE 197 09/18/2014    PROT 5.3  09/12/2014    PROT 6.0 05/14/2011    LABALBU 1.8 09/12/2014    CALCIUM 8.0 09/18/2014    BILITOT 2.1 09/12/2014    ALKPHOS 98 09/12/2014    AST 47 09/12/2014    ALT 27 09/12/2014     Uric Acid:  No components found for: URIC  U/A:    Lab Results   Component Value Date    NITRITE neg 10/18/2013    COLORU DK YELLOW 09/02/2014    COLORU yellow 10/18/2013    PHUR 5.0 09/02/2014    PHUR 5.0 10/18/2013    WBCUA 1 09/02/2014    RBCUA 10 09/02/2014  CLARITYU CLOUDY 09/02/2014    CLARITYU clear 10/18/2013    SPECGRAV 1.014 09/02/2014    SPECGRAV 1.010 10/18/2013    LEUKOCYTESUR Negative 09/02/2014    LEUKOCYTESUR neg 10/18/2013    UROBILINOGEN 1.0 09/02/2014    BILIRUBINUR Negative 09/02/2014    BILIRUBINUR neg 10/18/2013    BLOODU SMALL 09/02/2014    BLOODU neg 10/18/2013    GLUCOSEU Negative 09/02/2014    GLUCOSEU neg 10/18/2013         IMPRESSION/RECOMMENDATIONS:      Patient Active Problem List   Diagnosis   ??? Erectile dysfunction   ??? Thrombocytopenia ([place])   ??? Edema   ??? BPH (benign prostatic hyperplasia)   ??? Alpha-1-antitrypsin deficiency   ??? Cirrhosis, nonalcoholic ([place])   ??? Mild CAD   ??? Chronic systolic CHF (congestive heart failure) ([place])   ??? Non-ischemic cardiomyopathy ([place])   ??? Lumbar compression fracture ([place])   ??? Chronic low back pain   ??? Osteopenia   ??? Coagulopathy ([place])   ??? Hypotension   ??? Abnormal EKG   ??? Visual changes   ??? Fixed pupil of right eye   ??? Anemia   ??? Hemispheric carotid artery syndrome   ??? CAD in native artery   ??? Hyperlipidemia   ??? Idiopathic cardiomyopathy ([place])   ??? Bronchiectasis without complication ([place])   ??? Benign essential HTN   ??? Metabolic encephalopathy   ??? Ataxia   ??? Pancytopenia ([place])   ??? DVT (deep venous thrombosis) ([place])   ??? 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 Pneumonia   ??? Traumatic hematoma of buttock   ??? Pneumonia of both lungs due to infectious organism   ??? Hematoma   ??? Closed displaced fracture of left talus        Diagnosis and Plan :   1. Hypernatremia - lower  at 148   2. Myelodysplasia   3. Non ischemic cardiomyopathy

## 2014-09-18 NOTE — Progress Notes (Signed)
Patients family declined ABG

## 2014-09-18 NOTE — Progress Notes (Addendum)
POC: Pt to have NG placed tomorrow in endoscopy. Pt will need IV access. (PICC RN paged- due to pt being a hard stick.) Pt will be sedated before procedure. MD worried about low platelet count. Family aware of risk of bleeding during procedure.

## 2014-09-18 NOTE — Progress Notes (Signed)
Family has decided against tube feedings and having NG tube placed. Family now refusing further blood work. Family wishes to take pt home with hospice. Will let Cindy from Palliative care know.

## 2014-09-18 NOTE — Progress Notes (Signed)
NG tube removed. Tip of NG tube is caked with thick tube feed. A lot of bloody thick discharge came up with tube. No clog noted throughout tube, only on tip.

## 2014-09-18 NOTE — Progress Notes (Signed)
Pt repositioned in bed. Bed bath completed, complete linen change, gown change, and diaper change. Family at bedside. Hospice to meet with family at 28.

## 2014-09-18 NOTE — Progress Notes (Signed)
Oncology [facility] Hematology Care   Progress Note      09/18/2014 11:00 AM        Name: Andrew [name] .              Admitted: 09/02/2014    SUBJECTIVE:  Pt remains minimally responsive.  Lethargic.  Appears comfortable.  Wife remains at his side.    Reviewed interval ancillary notes    Current Medications    [START ON 23-Sep-2014] darbepoetin alfa-polysorbate SOLN 300 mcg Q7 Days   ferrous sulfate 300 (60 FE) MG/5ML syrup 300 mg Daily   ibuprofen (ADVIL;MOTRIN) tablet 400 mg Q6H PRN   acetaminophen (TYLENOL) 160 MG/5ML solution 650 mg Q4H PRN   dextrose 5 % solution Continuous   lidocaine (XYLOCAINE) 2 % jelly PRN   cefUROXime (CEFTIN) tablet 500 mg 2 times per day   acetaminophen (TYLENOL) suppository 650 mg Q4H PRN   haloperidol lactate (HALDOL) injection 2 mg Q6H PRN   sodium chloride flush 0.9 % injection 10 mL 2 times per day   vancomycin (VANCOCIN) oral solution 250 mg 3 times per day   carvedilol (COREG) tablet 3.125 mg BID   vitamin D (CHOLECALCIFEROL) tablet 1,000 Units Daily   citalopram (CELEXA) tablet 10 mg Daily   magnesium oxide (MAG-OX) tablet 400 mg BID   therapeutic multivitamin-minerals 1 tablet Daily   promethazine (PHENERGAN) tablet 12.5 mg Q6H PRN   rifaximin (XIFAXAN) tablet 550 mg BID   saccharomyces boulardii (FLORASTOR) capsule 250 mg BID   traMADol (ULTRAM) tablet 50 mg Q12H PRN   vitamin B-12 (CYANOCOBALAMIN) tablet 50 mcg Daily       Objective:  Visit Vitals   ??? BP 96/64   ??? Pulse 91   ??? Temp 97.1 ??F (36.2 ??C) (Axillary)   ??? Resp 20   ??? Ht 5\' 5"  (1.651 m)   ??? Wt 174 lb 12.8 oz (79.3 kg)   ??? SpO2 92%   ??? BMI 29.09 kg/m2       Intake/Output Summary (Last 24 hours) at 09/18/14 1100  Last data filed at 09/18/14 5188   Gross per 24 hour   Intake   2220 ml   Output    755 ml   Net   1465 ml    Wt Readings from Last 3 Encounters:   09/11/14 174 lb 12.8 oz (79.3 kg)   08/30/14 167 lb 9.6 oz (76 kg)   08/29/14 169 lb (76.7 kg)       General appearance:  Appears comfortable, obtunded.  Eyes: Sclera clear.  Pupils equal.  ENT: Moist oral mucosa. NG in place.  Cardiovascular: Regular rhythm, generalized anasarca.  Respiratory: Not using accessory muscles. Good inspiratory effort.  GI: Abdomen soft, no tenderness, not distended, normal bowel sounds  Musculoskeletal: No cyanosis in digits  Neurology:lethargic.  Psych: obtunded.  Not oriented.  Skin: Warm, dry, normal turgor    Labs and Tests:  CBC:   Recent Labs      09/16/14   0521  09/17/14   0529  09/18/14   0533   WBC  2.5*  2.5*  2.2*   HGB  7.8*  7.5*  7.7*   PLT  44*  42*  33*     BMP:  Recent Labs      09/16/14   0521  09/17/14   0529  09/18/14   0533   NA  153*  152*  148*   K  4.1  4.4  4.6   CL  121*  120*  116*   CO2  28  27  27    BUN  46*  43*  45*   CREATININE  1.0  0.7*  0.7*   GLUCOSE  198*  194*  197*     Hepatic: No results for input(s): AST, ALT, ALB, BILITOT, ALKPHOS in the last 72 hours.    ASSESSMENT AND PLAN    Principal Problem:    Idiopathic cardiomyopathy ([place])  Active Problems:    Chronic systolic CHF (congestive heart failure) ([facility])    Hypotension    Hyperlipidemia    Myelodysplasia (myelodysplastic syndrome) ([facility])    Closed displaced fracture of left talus      MDS: On Vidaza S/P C1.  ????  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 Transfuse as needed. Goal to maintain hgb 7-8. Transfuse plts if < 10. Receiving weekly Aranesp on Friday while inpatient.  ????  C Diff colitis - diarrhea resolved. Continue oral vancomycin.  ????  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  ????  Hypernatremia - improving, continue free water boluses  ??  Dysphagia - continue NG feedings.  ??  Chronic systolic CHF - ef 09-32%. Severe LVH.  ??  Anasarca - 2/2 hypoalbuminemia, chf, esld.  ??  Fevers, possible pneumonia - on ceftin per NG tube.    Metabolic encephalopathy - not improving.    Discussed with pt's wife at length.  She is agreeable to speaking with Palliative Care to discuss hospice options.    [name], [place]  [facility]  Office: [phone]  Cell: [phone]

## 2014-09-19 LAB — BASIC METABOLIC PANEL
Anion Gap: 8 (ref 3–16)
BUN: 50 mg/dL — ABNORMAL HIGH (ref 7–20)
CO2: 24 mmol/L (ref 21–32)
Calcium: 8.3 mg/dL (ref 8.3–10.6)
Chloride: 116 mmol/L — ABNORMAL HIGH (ref 99–110)
Creatinine: 0.6 mg/dL — ABNORMAL LOW (ref 0.8–1.3)
GFR African American: >60 (ref >60)
GFR Non-African American: >60 (ref >60)
Glucose: 112 mg/dL — ABNORMAL HIGH (ref 70–99)
Potassium: 5.3 mmol/L — ABNORMAL HIGH (ref 3.5–5.1)
Sodium: 148 mmol/L — ABNORMAL HIGH (ref 136–145)

## 2014-09-19 LAB — CBC
Hematocrit: 24.7 % — ABNORMAL LOW (ref 40.5–52.5)
Hemoglobin: 8.1 g/dL — ABNORMAL LOW (ref 13.5–17.5)
MCH: 32.9 pg (ref 26.0–34.0)
MCHC: 32.8 g/dL (ref 31.0–36.0)
MCV: 100.2 fL — ABNORMAL HIGH (ref 80.0–100.0)
MPV: 10.3 fL (ref 5.0–10.5)
Platelets: 52 10*3/uL — ABNORMAL LOW (ref 135–450)
RBC: 2.47 M/uL — ABNORMAL LOW (ref 4.20–5.90)
RDW: 23.4 % — ABNORMAL HIGH (ref 12.4–15.4)
WBC: 2.3 10*3/uL — ABNORMAL LOW (ref 4.0–11.0)

## 2014-09-19 MED ORDER — ACETAMINOPHEN 650 MG RE SUPP
650 MG | RECTAL | 3 refills | Status: AC | PRN
Start: 2014-09-19 — End: ?

## 2014-09-19 MED ORDER — CEFUROXIME AXETIL 500 MG PO TABS
500 MG | ORAL_TABLET | Freq: Two times a day (BID) | ORAL | 0 refills | Status: AC
Start: 2014-09-19 — End: 2014-09-24

## 2014-09-19 MED ORDER — ACETAMINOPHEN 160 MG/5ML PO SOLN
160 MG/5ML | ORAL | 3 refills | Status: AC | PRN
Start: 2014-09-19 — End: ?

## 2014-09-19 MED ORDER — LORAZEPAM 2 MG/ML PO CONC
2 MG/ML | Freq: Three times a day (TID) | ORAL | 0 refills | Status: AC | PRN
Start: 2014-09-19 — End: 2014-10-03

## 2014-09-19 MED ORDER — MORPHINE SULFATE (CONCENTRATE) 20 MG/ML PO SOLN
20 MG/ML | ORAL | 0 refills | Status: AC | PRN
Start: 2014-09-19 — End: 2014-09-29

## 2014-09-19 MED ORDER — LIDOCAINE HCL 2 % EX GEL
2 % | CUTANEOUS | Status: AC
Start: 2014-09-19 — End: ?

## 2014-09-19 MED FILL — NORMAL SALINE FLUSH 0.9 % IV SOLN: 0.9 % | INTRAVENOUS | Qty: 50

## 2014-09-19 MED FILL — VANCOMYCIN 50 MG/ML ORAL SOLUTION: 50 mg/mL | ORAL | Qty: 5

## 2014-09-19 NOTE — Care Coordination-Inpatient (Signed)
Discharge Planning:  Chart reviewed. Received call from [place] at [facility]. [phone]).  She has made arrangements for patient to discharge to home with hospice care. Await discharge order. She will be in later this morning.     Emslee Lopezmartinez A. Gilford Rile, MSW,LSW

## 2014-09-19 NOTE — Progress Notes (Signed)
Family refused positioning.  Patient appears to be comfortable.  Call light in reach and family at bedside.Electronically signed by Dede Query, RN on 09/19/2014 at 1:31 AM

## 2014-09-19 NOTE — Progress Notes (Signed)
Routine VS obtained.  Patient is now MEWs of 4.  Patient repositioned.  Family denies needs.  Call light in reach and bed alarm in use.Electronically signed by Dede Query, RN on 09/19/2014 at 5:48 AM

## 2014-09-19 NOTE — Progress Notes (Signed)
Pt repositioned in bed. Family at bedside. Plan for hospice to take pt home today. Family denies any needs at this time. Pt appears comfortable, breaths are even and easy. Family aware to press call light if they need anything.

## 2014-09-19 NOTE — Progress Notes (Signed)
Pt repositioned in bed, whole family at bedside. No needs expressed at this time.

## 2014-09-19 NOTE — Oncology Nurse Navigation (Signed)
Oncology and Hematology Care   Progress Note    09/19/2014    SUBJECTIVE:  Patient is not arouseable this morning. Wife anticipates discharge with [facility]. No other new problems.     OBJECTIVE:    Physical Assessment:  Vitals:    Visit Vitals   ??? BP 93/58   ??? Pulse 104   ??? Temp 98 ??F (36.7 ??C)   ??? Resp 16   ??? Ht 5\' 5"  (1.651 m)   ??? Wt 174 lb 12.8 oz (79.3 kg)   ??? SpO2 90%   ??? BMI 29.09 kg/m2      24HR INTAKE/OUTPUT:    Intake/Output Summary (Last 24 hours) at 09/19/14 1610  Last data filed at 09/19/14 0544   Gross per 24 hour   Intake      0 ml   Output    650 ml   Net   -650 ml      not communicative- jaundiced  HEENT: PERRL, Neck: soft, supple, no cervical, supraclavicular or axillary adenopathy  RESPIRATORY:  No increased work of breathing, breath sounds decreased.  CARDIOVASCULAR:  Cardiac S1/S2, RRR  GASTROINTESTINAL:  abdomen soft +BS x4,  ascites  SKIN:  negative for rash and skin lesion(s)  MUSCULOSKELETAL:  negative for pain and muscle weakness  EXTREMITIES: 3+ peripheral edema    Labs Results:    CBC: Recent Labs      09/17/14   0529  09/18/14   0533  09/19/14   0547   WBC  2.5*  2.2*  2.3*   HGB  7.5*  7.7*  8.1*   HCT  22.9*  24.0*  24.7*   MCV  99.4  101.8*  100.2*   PLT  42*  33*  52*     BMP: Recent Labs      09/17/14   0529  09/18/14   0533  09/19/14   0547   NA  152*  148*  148*   K  4.4  4.6  5.3*   CL  120*  116*  116*   CO2  27  27  24    BUN  43*  45*  50*   CREATININE  0.7*  0.7*  0.6*     LIVER PROFILE: No results for input(s): AST, ALT, LIPASE, BILIDIR, BILITOT, ALKPHOS in the last 72 hours.    Invalid input(s):  AMYLASE,  ALB  PT/INR:    Lab Results   Component Value Date    PROTIME 16.2 09/02/2014    PROTIME 15.2 08/01/2014    PROTIME 15.7 07/25/2014    INR 1.41 09/02/2014    INR 1.33 08/01/2014    INR 1.37 07/25/2014    INR 1.17 05/14/2010    INR 1.18 02/13/2010    INR 1.15 10/17/2009     PTT:    Lab Results   Component Value Date    APTT 30.7 09/02/2014    APTT 33.5  08/01/2014    APTT 46.5 07/25/2014    APTT 29.7 10/17/2009     UA:No results for input(s): NITRITE, COLORU, PHUR, LABCAST, WBCUA, RBCUA, MUCUS, TRICHOMONAS, YEAST, BACTERIA, CLARITYU, SPECGRAV, LEUKOCYTESUR, UROBILINOGEN, BILIRUBINUR, BLOODU, GLUCOSEU, AMORPHOUS in the last 72 hours.    Invalid input(s): KETONESU  Magnesium:   Lab Results   Component Value Date    MG 2.20 09/02/2014    MG 1.50 01/14/2014    MG 1.70 01/10/2014     ANA:  Lab Results   Component Value Date  ANATITER Negative at <1-80 10/17/2009    ANA POSITIVE at 1-40 10/17/2009       RADIOLOGY:    Xr Ankle Left Standard    Result Date: 09/02/2014  EXAMINATION: 3 VIEWS OF THE LEFT ANKLE  09/02/2014 5:34 pm  COMPARISON: None.  HISTORY: ORDERING SYSTEM PROVIDED HISTORY: injury TECHNOLOGIST PROVIDED HISTORY: Ordering Physician Provided Reason for Exam: patient from [facility] brought in by [employer]. was using his walker with wife as stand by and reportedly got weak and fell. per wife his responsiveness has been altered. Acuity: Unknown Type of Exam: Initial  FINDINGS: There is an acute mildly displaced fracture along the dorsal margin of the distal talus.  No acute fracture demonstrated elsewhere and no dislocation.     Acute fracture of the talus.     Ct Head Wo Contrast    Result Date: 09/02/2014  EXAMINATION: CT OF THE HEAD WITHOUT CONTRAST  09/02/2014 5:43 pm  TECHNIQUE: CT of the head was performed without the administration of intravenous contrast. Dose modulation, iterative reconstruction, and/or weight based adjustment of the mA/kV was utilized to reduce the radiation dose to as low as reasonably achievable.  COMPARISON: CT brain without contrast 07/07/2014  HISTORY: ORDERING SYSTEM PROVIDED HISTORY: HEAD TRAUMA, CLOSED, MILD, ABN NEURO EXAM AND/OR RISK FACTORS TECHNOLOGIST PROVIDED HISTORY: If patient is on cardiac monitor and/or pulse ox, they may be taken off cardiac monitor and pulse ox, left on O2 if currently on. All monitors  reattached when patient returns to room. Ordering Physician Provided Reason for Exam: ams  FINDINGS: BRAIN/VENTRICLES: There is no acute intracranial hemorrhage, mass effect or midline shift. No abnormal extra-axial fluid collection.  The gray-white differentiation is maintained without evidence of an acute infarct. There is continued mild prominence of the ventricles and sulci due to global parenchymal volume loss.  There are nonspecific areas of hypoattenuation within the periventricular and subcortical white matter, which likely represent chronic microvascular ischemic change.  ORBITS: The visualized portion of the orbits demonstrate no acute abnormality.  SINUSES: The visualized paranasal sinuses and mastoid air cells demonstrate no acute abnormality.  SOFT TISSUES/SKULL: No acute abnormality of the visualized skull or soft tissues.     No acute intracranial abnormality.  Mild cerebral atrophy unchanged.     US Gallbladder Ruq    Result Date: 09/13/2014  EXAMINATION: RIGHT UPPER QUADRANT ULTRASOUND  09/13/2014 10:25 am  COMPARISON: CT abdomen and pelvis, 07/30/2014  HISTORY: ORDERING SYSTEM PROVIDED HISTORY: TECHNOLOGIST PROVIDED HISTORY: Ordering Physician Provided Reason for Exam: pain Acuity: Unknown Type of Exam: Unknown  FINDINGS: LIVER:  The liver is small with undulating margin.  Parenchyma is mildly inhomogeneous.  No mass is identified.  BILIARY SYSTEM:  Gallbladder is unremarkable without evidence of pericholecystic fluid, wall thickening or stones.  Negative sonographic Murphy's sign.  Common bile duct is within normal limits measuring 6 mm.  RIGHT KIDNEY: The right kidney is grossly unremarkable without evidence of hydronephrosis. It measures 8.5 x 5.2 x 6.3 cm  PANCREAS:  Visualized portions of the pancreas are unremarkable.  OTHER: A right basilar pleural effusion is noted.     No cholelithiasis is identified.  The common duct is at the upper limits of normal in size.  Stable, cirrhotic morphology of  the liver.  Right basal pleural effusion.     Xr Chest Portable    Result Date: 09/13/2014  EXAMINATION: SINGLE VIEW OF THE CHEST  09/13/2014 6:06 pm  COMPARISON: None.  HISTORY: ORDERING SYSTEM PROVIDED HISTORY: Check  NG tube placement TECHNOLOGIST PROVIDED HISTORY: Low chest/high abdomen Ordering Physician Provided Reason for Exam: Check NG tube placement Acuity: Unknown Type of Exam: Unknown  FINDINGS: NG tube tip within the stomach.  Proximal port is at the GE junction which may predispose to gastroesophageal reflux.  Recommend advancement by 5-10 cm.  Bilateral pulmonary edema is present similar to prior examination.  Bilateral pleural effusions.     Heart and lungs appears similar.  NG tube tip within the stomach.  Consider advancement.     Xr Chest Portable    Result Date: 09/11/2014  EXAMINATION: SINGLE VIEW OF THE CHEST  09/11/2014 9:08 pm  COMPARISON: 09/02/2014  HISTORY: ORDERING SYSTEM PROVIDED HISTORY: Elevated temperature TECHNOLOGIST PROVIDED HISTORY: Ordering Physician Provided Reason for Exam: Elevated temperature Acuity: Acute Type of Exam: Unknown  FINDINGS: The heart is borderline enlarged but similar to the prior exam.  There is ill definition of the pulmonary vascularity.  There are bilateral symmetric perihilar and bibasilar infiltrates with small pleural effusions.     Findings are most consistent with development of moderate CHF.  Multifocal pneumonia cannot be excluded.     Xr Chest Portable    Result Date: 09/02/2014  EXAMINATION: SINGLE VIEW OF THE CHEST  09/02/2014 5:34 pm  COMPARISON: Chest x-ray Aug 04, 2014  HISTORY: ORDERING SYSTEM PROVIDED HISTORY: SOB TECHNOLOGIST PROVIDED HISTORY: Ordering Physician Provided Reason for Exam: patient from [facility] brought in by [employer]. was using his walker with wife as stand by and reportedly got weak and fell. per wife his responsiveness has been altered. Acuity: Unknown Type of Exam: Initial  FINDINGS: There is a moderate-sized infiltrate of  the right lung base.  No acute pulmonary process demonstrated elsewhere.  No definitive portable chest x-ray evidence of pleural effusion.  No pneumothorax demonstrated.  Cardiac and mediastinal shadows appear stable     Moderate amount of right basilar atelectasis versus pneumonia.  This is very similar to the comparison.       Current Medications  ??? ferrous sulfate  300 mg Per NG tube Daily   ??? cefUROXime  500 mg Oral 2 times per day   ??? sodium chloride flush  10 mL Intravenous 2 times per day   ??? vancomycin  250 mg Oral 3 times per day   ??? carvedilol  3.125 mg Oral BID   ??? vitamin D  1,000 Units Oral Daily   ??? citalopram  10 mg Oral Daily   ??? magnesium oxide  400 mg Oral BID   ??? therapeutic multivitamin-minerals  1 tablet Oral Daily   ??? rifaximin  550 mg Oral BID   ??? saccharomyces boulardii  250 mg Oral BID   ??? 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 He is patient of Dr. Gwinda Maine. We will be happy to attend during hospice care but will defer to Dr Susie Cassette  Family anticipates discharge to home with hospice care        I have discussed the above stated plan with the patient and they verbalized understanding and agreed with the plan. Thank you for allowing [place] to participate in this patients care.    Darlyn Read, MD, 09/19/2014, 8:08 AM

## 2014-09-19 NOTE — Progress Notes (Signed)
Family has made decision for hospice and do not wish to have ng or peg placed.     Andrew Blanks, MD  [place] [facility] [place]

## 2014-09-19 NOTE — Progress Notes (Signed)
Department of Internal Medicine  Nephrology Attending Progress Note        SUBJECTIVE:  We are following this patient for Hypernatremia . Patient progress reviewed.   Wife by his side     Physical Exam:    VITALS:    Visit Vitals   ??? BP 93/58   ??? Pulse 104   ??? Temp 98 ??F (36.7 ??C)   ??? Resp 16   ??? Ht 5\' 5"  (1.651 m)   ??? Wt 174 lb 12.8 oz (79.3 kg)   ??? SpO2 90%   ??? BMI 29.09 kg/m2     BLOOD PRESSURE RANGE:  Systolic (76HYW), VPX:10 , Min:93 , GYI:948   ; Diastolic (54OEV), OJJ:00, Min:58, Max:68    24HR INTAKE/OUTPUT:      Intake/Output Summary (Last 24 hours) at 09/19/14 1138  Last data filed at 09/19/14 0544   Gross per 24 hour   Intake      0 ml   Output    650 ml   Net   -650 ml       Constitutional:  Opens eyes , moving his head   Pallor +   Cardiovascular/Edema:  Regular heart sounds , no murmur   Respiratory:  CTA  Gastrointestinal:   Soft, bowel sounds +   Other:  Edema  2 +    DATA:    CBC with Differential:    Lab Results   Component Value Date    WBC 2.3 09/19/2014    WBC 2.6 08/30/2014    RBC 2.47 09/19/2014    RBC 2.96 08/30/2014    HGB 8.1 09/19/2014    HCT 24.7 09/19/2014    PLT 52 09/19/2014    MCV 100.2 09/19/2014    MCH 32.9 09/19/2014    MCHC 32.8 09/19/2014    RDW 23.4 09/19/2014    NRBC 11 09/16/2014    SEGSPCT 42.6 05/14/2011    BANDSPCT 10 09/16/2014    METASPCT 4 09/02/2014    LYMPHOPCT 42.0 09/16/2014    LYMPHOPCT 41.9 08/30/2014    MONOPCT 12.0 09/16/2014    MYELOPCT 5 09/02/2014    EOSPCT 2.1 05/14/2010    BASOPCT 0.0 09/16/2014    MONOSABS 0.3 09/16/2014    LYMPHSABS 1.0 09/16/2014    EOSABS 0.0 09/16/2014    BASOSABS 0.0 09/16/2014    DIFFTYPE Scan-K 05/14/2011     CMP:    Lab Results   Component Value Date    NA 148 09/19/2014    K 5.3 09/19/2014    CL 116 09/19/2014    CO2 24 09/19/2014    BUN 50 09/19/2014    CREATININE 0.6 09/19/2014    GFRAA >60 09/19/2014    GFRAA >60 05/14/2011    AGRATIO 0.5 09/12/2014    LABGLOM >60 09/19/2014    GLUCOSE 112 09/19/2014    PROT 5.3 09/12/2014     PROT 6.0 05/14/2011    LABALBU 1.8 09/12/2014    CALCIUM 8.3 09/19/2014    BILITOT 2.1 09/12/2014    ALKPHOS 98 09/12/2014    AST 47 09/12/2014    ALT 27 09/12/2014     Uric Acid:  No components found for: URIC  U/A:    Lab Results   Component Value Date    NITRITE neg 10/18/2013    COLORU DK YELLOW 09/02/2014    COLORU yellow 10/18/2013    PHUR 5.0 09/02/2014    PHUR 5.0 10/18/2013    WBCUA 1 09/02/2014  RBCUA 10 09/02/2014    CLARITYU CLOUDY 09/02/2014    CLARITYU clear 10/18/2013    SPECGRAV 1.014 09/02/2014    SPECGRAV 1.010 10/18/2013    LEUKOCYTESUR Negative 09/02/2014    LEUKOCYTESUR neg 10/18/2013    UROBILINOGEN 1.0 09/02/2014    BILIRUBINUR Negative 09/02/2014    BILIRUBINUR neg 10/18/2013    BLOODU SMALL 09/02/2014    BLOODU neg 10/18/2013    GLUCOSEU Negative 09/02/2014    GLUCOSEU neg 10/18/2013         IMPRESSION/RECOMMENDATIONS:      Patient Active Problem List   Diagnosis   ??? Erectile dysfunction   ??? Thrombocytopenia ([place])   ??? Edema   ??? BPH (benign prostatic hyperplasia)   ??? Alpha-1-antitrypsin deficiency   ??? Cirrhosis, nonalcoholic ([place])   ??? Mild CAD   ??? Chronic systolic CHF (congestive heart failure) ([place])   ??? Non-ischemic cardiomyopathy ([place])   ??? Lumbar compression fracture ([employer])   ??? Chronic low back pain   ??? Osteopenia   ??? Coagulopathy ([place])   ??? Hypotension   ??? Abnormal EKG   ??? Visual changes   ??? Fixed pupil of right eye   ??? Anemia   ??? Hemispheric carotid artery syndrome   ??? CAD in native artery   ??? Hyperlipidemia   ??? Idiopathic cardiomyopathy ([place])   ??? Bronchiectasis without complication ([place])   ??? Benign essential HTN   ??? Metabolic encephalopathy   ??? Ataxia   ??? Pancytopenia ([place])   ??? DVT (deep venous thrombosis) ([place])   ??? 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 Pneumonia   ??? Traumatic hematoma of buttock   ??? Pneumonia of both lungs due to infectious organism   ??? Hematoma   ??? Closed displaced fracture of left talus        Diagnosis and Plan :   1. Hypernatremia - 148 , continue Free  water Via NG tube   2. Myelodysplasia   3. Non ischemic cardiomyopathy         Going to hospice . Will sign off

## 2014-09-19 NOTE — Progress Notes (Signed)
Pt discharged with hospice. Family denies any needs at this time.

## 2014-09-19 NOTE — Progress Notes (Signed)
Patient seen discharge dictated scripts given arrangements made met with family all questions and concerns addressed

## 2014-09-19 NOTE — Progress Notes (Signed)
Speech Language Pathology  Discharge  Name: Andrew [name]  DOB: August 28, 1940  Medical Diagnosis: Hypotension [I95.9]  Treatment Diagnosis: Dysphagia    Speech Therapy/Dysphagia  Discussed with RN, pt to be discharged home with hospice this date. Per RN pt has been largely non-responsive this AM. Will discharge from speech caseload at this time. Please re-consult if indicated. Bedside Swallow Eval completed 09/09/2014 (see report). No goals met prior to discharge.    DIET LEVEL AT DISCHARGE: NPO with strict oral care     GOALS ADDRESSED:  1.) Pt will tolerate recommended diet without s/s of aspiration (GOAL NOT MET)   2.) Pt will tolerate MBS to r/o aspiration and determine appropriate diet/liquid level (GOAL NOT MET)   3.) Additional goals/recs pending MBS results(GOAL NOT MET)   4.) Pt will improve oral motor function via bolus control exercises 5/5 ??(GOAL NOT MET)   5.) Pt will improve sensory/motor swallow response via DPNS techniques(GOAL NOT MET)     Joaquim Lai, MA CCC-SLP [phone]  Speech Language Pathologist      ??

## 2014-09-19 NOTE — Progress Notes (Signed)
Patient in bed, eyes closed.  Family at bedside.  Vanc held due to patient condition.Electronically signed by Dede Query, RN on 09/19/2014 at 12:16 AM

## 2014-09-20 NOTE — Telephone Encounter (Signed)
Faith at [facility] called requesting Needs to know if the body can be released. Needed immediately. ePaged Provider.

## 2014-09-20 NOTE — Progress Notes (Signed)
Physical Therapy Discharge Summary    Name: Plumer Mittelstaedt  DOB: [birth year]    The pt was evaluated by PT on 09/12/14 and seen for 2 treatment sessions prior to DC to ECF on 09/19/14 per MD order.   The pt's acute therapy goals were:  Short term goals  Time Frame for Short term goals: to be met prior to discharge   Short term goal 1: Pt to complete all bed mobility with maxA.   Short term goal 2: Pt to sit EOB x10 minutes with SUPV.   Short term goal 3: Patient will complete lateral, slide board transfer (due to NWB LLE) with maxA of 1.        Patient met 0 goals during stay.  Number of Refusals:0  Number of Holds: 2  During this hospitalization, the patient was educated on:  Patient Education: Educated pt and spouse on role of PT, POC, and discharge recommendations.     DC pt from PT caseload at this time.  Thank you, Jawanda Passey Kendrick Fries, PT

## 2014-09-20 NOTE — Telephone Encounter (Signed)
Faith at [facility] has called to report the passing of Andrew [name], [birth year]. Date of death- 10/10/2014. Front Desk- Please mark expired. Faith may be reached at [phone] or cell at  [phone]

## 2014-09-20 NOTE — Progress Notes (Signed)
Occupational Therapy  Occupational Therapy Discharge Summary    Name: Andrew [name]  DOB: [birth year]    The pt was evaluated by OT on 7/11 and seen for 3 treatment sessions prior to DC toecf on3 per MD order.   The pt's acute therapy goals were:  Short term goals  Time Frame for Short term goals: Discharge  Short term goal 1: Patient will perform bed mobility modA of 1--Dependent, Max A of 2 supine<>sit  Short term goal 2: Patient will tolerate sitting EOB supervision x 10-12 minutes to assist with ADL completion- pt unable to tolerate sitting EOB this date  Short term goal 3: Patient will perform simple grooming tasks minA--not addressed  Short term goal 4: Patient will tolerate lateral, slide board transfer (due to NWB LLE) with maxA of 1 -- not addressed  Long term goals  Time Frame for Long term goals : LTG=STG     Patient met0goals during stay.  Number of Refusals0  Number of Holds0  During this hospitalization, the patient was educated on:  Patient Education: Evaluation, safety, sitting tolerance, simple grooming, bed mobility    DC pt from OT caseload at this time.  Thank you, Virl Son,

## 2014-09-23 ENCOUNTER — Encounter: Primary: Internal Medicine

## 2014-09-24 ENCOUNTER — Encounter: Primary: Internal Medicine

## 2014-09-25 ENCOUNTER — Encounter: Primary: Internal Medicine

## 2014-09-26 ENCOUNTER — Encounter: Primary: Internal Medicine

## 2014-09-27 ENCOUNTER — Encounter: Primary: Internal Medicine

## 2014-09-27 ENCOUNTER — Encounter: Attending: Hematology & Oncology | Primary: Internal Medicine

## 2014-09-28 NOTE — Discharge Summary (Signed)
PATIENT NAME:                 PA #:            MR #Andrew [name], Andrew [name]                [id number]       [id number]            ATTENDING PHYSICIAN:                  ADM DATE:   DIS DATE:          Antonieta Iba, MD               09/02/2014  09/19/2014         DATE OF BIRTH:   AGE:           PATIENT TYPE:                         Nov 08, 1940       73             IPF                                      FINAL DIAGNOSES:  1.  Metabolic encephalopathy.  2.  Chronic liver disease.  3.  Clostridium difficile colitis.  4.  Sepsis.  5.  Chronic congestive heart failure.  6.  Severe myelodysplastic syndrome.  7.  Pancytopenia.  8.  Alpha-1 antitrypsin deficiency.  9.  Chronic systolic heart failure.  10.  Severe anemia.  11.  Hypotension.     DISCHARGE MEDICATIONS:    1.  Roxanol 4 mg every hour p.r.n.    2.  Ativan Intensol 1 mg every 8 hours p.r.n.   3.  Lidocaine jelly p.r.n. for pain.  4.  Coreg 6.25 twice a day.  5.  Cholecalciferol.  6.  Vitamin D3, 1000 units daily.  7.  Rifaximin 550 mg 2 times a day.  8.  Multivitamin once a day.     HOSPITAL COURSE:  This elderly gentleman with advanced myelodysplastic  syndrome with pancytopenia which has been underlying some oral chemotherapy.   The patient also has chronic liver disease and history of metabolic  encephalopathy with ammonia intoxication.  The patient was also having issues  with his heart.  The patient was admitted here for altered mental status.   The patient also had a bad C difficile colitis for which he was given IV  Flagyl and p.o. vancomycin.  The patient continued to remain very  encephalopathic.  The patient became hypernatremic with sodium of 153.   Nephrology consultation was also obtained.  BUN was 51 and creatinine 1.2.   Sodium went all the way up to 158.    The patient underwent a transesophageal echocardiography that showed normal  LV wall thickness, LV function severely reduced with ejection fraction of  30%, moderate tricuspid  regurgitation with right ventricle systolic pressure  elevated at 46 mm with biatrial enlargement.  In the light of multiple health  problems, there is no cure in sight.     Family was consulted.  Family was advised consultation with hospice.   Initially they were reluctant to accept it.  In fact, I just found  out Dr.  _____ also had in the past recommended this on several occasions to the  family, but family was just not totally mentally prepared for that.  The  patient was also provided co-management with hematology/oncology service, Dr.  Mariella Saa and Dr. _____.       Multi-systemic approaches including nephrology, hematology/oncology and  myself were recommended.     Patient continued to deteriorate.  Finally it was decided to refer the case  to hospice and family did understand and accepted it.  The patient was also seen by GI services for the chronic liver disease and  they did not have anything new in addition, but the patient was already given  at this time after correcting all the metabolic abnormality, the patients  mental status continues to remain poor.  He has no oral intake.  Putting a  PEG tube is not facilitating the philosophy of the family and the family  accepted they want to take patient home with hospice services.  Necessary  arrangements were made.  The patient was already familiar to hospice,  [facility], and they be taking over the care.  The family was  on multiple occasions provided family consultations.  All their questions  were answered to their satisfaction.  The patients family acknowledged they  were very pleased with the care, but finally it was time for him to go home.     The patient was dismissed in stable condition.  All the comfort medication  prescriptions were given.     It is always a pleasure to take care of your patients at [facility], Dr. Odetta Pink.                                            Antonieta Iba, MD     [id number]  DD: 09/28/2014  09:09   DT: 09/28/2014 14:10   Job #: [id number]  CC: Antonieta Iba, MD  CC: Alba Cory, MD

## 2014-10-07 DEATH — deceased

## 2014-12-26 ENCOUNTER — Encounter: Attending: Cardiovascular Disease | Primary: Internal Medicine

## 2015-03-12 LAB — CBC WITH AUTO DIFFERENTIAL
Basophils %: 0 %
Basophils Absolute: 0 10*3/uL (ref 0.0–0.2)
Eosinophils %: 0 %
Eosinophils Absolute: 0 10*3/uL (ref 0.0–0.6)
Hematocrit: 24.7 % — ABNORMAL LOW (ref 40.5–52.5)
Hemoglobin: 8.4 g/dL — ABNORMAL LOW (ref 13.5–17.5)
Lymphocytes %: 58 %
Lymphocytes Absolute: 1.1 10*3/uL (ref 1.0–5.1)
MCH: 40.5 pg — ABNORMAL HIGH (ref 26.0–34.0)
MCHC: 34 g/dL (ref 31.0–36.0)
MCV: 119.2 fL — ABNORMAL HIGH (ref 80.0–100.0)
MPV: 8.8 fL (ref 5.0–10.5)
Monocytes %: 9 %
Monocytes Absolute: 0.2 10*3/uL (ref 0.0–1.3)
Neutrophils %: 33 %
Neutrophils Absolute: 0.6 10*3/uL — CL (ref 1.7–7.7)
PLATELET SLIDE REVIEW: DECREASED
Platelets: 55 10*3/uL — ABNORMAL LOW (ref 135–450)
RBC: 2.07 M/uL — ABNORMAL LOW (ref 4.20–5.90)
RDW: 18.7 % — ABNORMAL HIGH (ref 12.4–15.4)
WBC: 1.9 10*3/uL — CL (ref 4.0–11.0)

## 2015-04-09 DIAGNOSIS — I340 Nonrheumatic mitral (valve) insufficiency: Secondary | ICD-10-CM | POA: Insufficient documentation

## 2015-11-03 DIAGNOSIS — E1151 Type 2 diabetes mellitus with diabetic peripheral angiopathy without gangrene: Secondary | ICD-10-CM | POA: Insufficient documentation

## 2015-11-03 DIAGNOSIS — E1165 Type 2 diabetes mellitus with hyperglycemia: Secondary | ICD-10-CM | POA: Insufficient documentation

## 2016-05-06 DIAGNOSIS — K219 Gastro-esophageal reflux disease without esophagitis: Secondary | ICD-10-CM | POA: Insufficient documentation

## 2017-02-11 ENCOUNTER — Encounter: Payer: Self-pay | Admitting: *Deleted

## 2017-02-11 ENCOUNTER — Ambulatory Visit
Admission: RE | Admit: 2017-02-11 | Discharge: 2017-02-12 | Disposition: A | Discharge status: Home / Self Care | Payer: Medicare Other | Source: Ambulatory Visit | Attending: Orthopedic Surgery | Admitting: Orthopedic Surgery

## 2017-02-11 ENCOUNTER — Ambulatory Visit: Payer: Medicare Other | Admitting: Anesthesiology

## 2017-02-11 ENCOUNTER — Encounter
Admission: RE | Disposition: A | Discharge status: Home / Self Care | Payer: Self-pay | Source: Ambulatory Visit | Attending: Orthopedic Surgery

## 2017-02-11 DIAGNOSIS — E785 Hyperlipidemia, unspecified: Secondary | ICD-10-CM | POA: Diagnosis not present

## 2017-02-11 DIAGNOSIS — Z79899 Other long term (current) drug therapy: Secondary | ICD-10-CM | POA: Insufficient documentation

## 2017-02-11 DIAGNOSIS — D649 Anemia, unspecified: Secondary | ICD-10-CM | POA: Diagnosis not present

## 2017-02-11 DIAGNOSIS — I2510 Atherosclerotic heart disease of native coronary artery without angina pectoris: Secondary | ICD-10-CM | POA: Diagnosis not present

## 2017-02-11 DIAGNOSIS — W109XXA Fall (on) (from) unspecified stairs and steps, initial encounter: Secondary | ICD-10-CM | POA: Diagnosis not present

## 2017-02-11 DIAGNOSIS — Z7984 Long term (current) use of oral hypoglycemic drugs: Secondary | ICD-10-CM | POA: Diagnosis not present

## 2017-02-11 DIAGNOSIS — I10 Essential (primary) hypertension: Secondary | ICD-10-CM | POA: Diagnosis not present

## 2017-02-11 DIAGNOSIS — S76112A Strain of left quadriceps muscle, fascia and tendon, initial encounter: Secondary | ICD-10-CM | POA: Diagnosis not present

## 2017-02-11 DIAGNOSIS — E119 Type 2 diabetes mellitus without complications: Secondary | ICD-10-CM | POA: Insufficient documentation

## 2017-02-11 DIAGNOSIS — Z87891 Personal history of nicotine dependence: Secondary | ICD-10-CM | POA: Insufficient documentation

## 2017-02-11 DIAGNOSIS — Z7982 Long term (current) use of aspirin: Secondary | ICD-10-CM | POA: Diagnosis not present

## 2017-02-11 DIAGNOSIS — Y92009 Unspecified place in unspecified non-institutional (private) residence as the place of occurrence of the external cause: Secondary | ICD-10-CM | POA: Diagnosis not present

## 2017-02-11 HISTORY — PX: QUADRICEPS TENDON REPAIR: SHX756

## 2017-02-11 LAB — GLUCOSE, CAPILLARY
GLUCOSE-CAPILLARY: 119 mg/dL — AB (ref 65–99)
Glucose-Capillary: 99 mg/dL (ref 65–99)
Glucose-Capillary: 66 mg/dL (ref 65–99)
Glucose-Capillary: 114 mg/dL — ABNORMAL HIGH (ref 65–99)
Glucose-Capillary: 75 mg/dL (ref 65–99)
Glucose-Capillary: 82 mg/dL (ref 65–99)
Glucose-Capillary: 181 mg/dL — ABNORMAL HIGH (ref 65–99)

## 2017-02-11 SURGERY — REPAIR, TENDON, QUADRICEPS
Anesthesia: General | Laterality: Left | Wound class: Clean

## 2017-02-11 MED ORDER — OXYCODONE HCL 5 MG PO TABS: 5.0000 mg | ORAL_TABLET | ORAL | 0 refills | Status: AC | PRN

## 2017-02-11 MED ORDER — HYDROMORPHONE HCL 1 MG/ML IJ SOLN
INTRAMUSCULAR | Status: AC
  Administered 2017-02-11: 0.5 mg via INTRAVENOUS
  Filled 2017-02-11: qty 1

## 2017-02-11 MED ORDER — FENTANYL CITRATE (PF) 100 MCG/2ML IJ SOLN
INTRAMUSCULAR | Status: DC | PRN
  Administered 2017-02-11 (×2): 50 ug via INTRAVENOUS

## 2017-02-11 MED ORDER — ONDANSETRON HCL 4 MG/2ML IJ SOLN: 4.0000 mg | Freq: Once | INTRAMUSCULAR | Status: DC | PRN

## 2017-02-11 MED ORDER — ROCURONIUM BROMIDE 100 MG/10ML IV SOLN
INTRAVENOUS | Status: DC | PRN
  Administered 2017-02-11: 10 mg via INTRAVENOUS
  Administered 2017-02-11: 40 mg via INTRAVENOUS
  Administered 2017-02-11 (×2): 10 mg via INTRAVENOUS

## 2017-02-11 MED ORDER — PROPOFOL 10 MG/ML IV BOLUS
INTRAVENOUS | Status: AC
  Filled 2017-02-11: qty 20

## 2017-02-11 MED ORDER — SUGAMMADEX SODIUM 200 MG/2ML IV SOLN
INTRAVENOUS | Status: DC | PRN
  Administered 2017-02-11: 200 mg via INTRAVENOUS

## 2017-02-11 MED ORDER — LIDOCAINE-EPINEPHRINE 1 %-1:100000 IJ SOLN
INTRAMUSCULAR | Status: DC | PRN
  Administered 2017-02-11: 12.5 mL via INTRADERMAL

## 2017-02-11 MED ORDER — SODIUM CHLORIDE 0.9 % IV SOLN
INTRAVENOUS | Status: DC
  Administered 2017-02-11 (×2): via INTRAVENOUS

## 2017-02-11 MED ORDER — OXYCODONE HCL 5 MG PO TABS
5.0000 mg | ORAL_TABLET | ORAL | Status: DC | PRN
  Administered 2017-02-11 (×2): 5 mg via ORAL

## 2017-02-11 MED ORDER — DEXAMETHASONE SODIUM PHOSPHATE 10 MG/ML IJ SOLN
INTRAMUSCULAR | Status: DC | PRN
  Administered 2017-02-11: 10 mg via INTRAVENOUS

## 2017-02-11 MED ORDER — FAMOTIDINE 20 MG PO TABS
ORAL_TABLET | ORAL | Status: AC
  Filled 2017-02-11: qty 1

## 2017-02-11 MED ORDER — LIDOCAINE HCL (CARDIAC) 20 MG/ML IV SOLN
INTRAVENOUS | Status: DC | PRN
  Administered 2017-02-11: 100 mg via INTRAVENOUS

## 2017-02-11 MED ORDER — ENOXAPARIN SODIUM 40 MG/0.4ML ~~LOC~~ SOLN: 40.0000 mg | SUBCUTANEOUS | 0 refills | Status: DC

## 2017-02-11 MED ORDER — NEOMYCIN-POLYMYXIN B GU 40-200000 IR SOLN
Status: DC | PRN
  Administered 2017-02-11: 2 mL

## 2017-02-11 MED ORDER — OXYCODONE HCL 5 MG PO TABS
ORAL_TABLET | ORAL | Status: AC
  Administered 2017-02-11: 5 mg via ORAL
  Filled 2017-02-11: qty 1

## 2017-02-11 MED ORDER — DEXTROSE-NACL 5-0.45 % IV SOLN
INTRAVENOUS | Status: DC | PRN
Start: 2017-02-11 — End: 2017-02-11
  Administered 2017-02-11: 22:00:00 via INTRAVENOUS

## 2017-02-11 MED ORDER — DEXAMETHASONE SODIUM PHOSPHATE 10 MG/ML IJ SOLN
INTRAMUSCULAR | Status: AC
  Filled 2017-02-11: qty 1

## 2017-02-11 MED ORDER — NEOMYCIN-POLYMYXIN B GU 40-200000 IR SOLN
Status: AC
  Filled 2017-02-11: qty 2

## 2017-02-11 MED ORDER — ACETAMINOPHEN 10 MG/ML IV SOLN
INTRAVENOUS | Status: DC | PRN
  Administered 2017-02-11: 1000 mg via INTRAVENOUS

## 2017-02-11 MED ORDER — FENTANYL CITRATE (PF) 100 MCG/2ML IJ SOLN
25.0000 ug | INTRAMUSCULAR | Status: AC | PRN
Start: 2017-02-11 — End: 2017-02-11
  Administered 2017-02-11 (×6): 25 ug via INTRAVENOUS

## 2017-02-11 MED ORDER — LABETALOL HCL 5 MG/ML IV SOLN
INTRAVENOUS | Status: AC
  Filled 2017-02-11: qty 4

## 2017-02-11 MED ORDER — FENTANYL CITRATE (PF) 100 MCG/2ML IJ SOLN
INTRAMUSCULAR | Status: AC
  Filled 2017-02-11: qty 2

## 2017-02-11 MED ORDER — ONDANSETRON HCL 4 MG/2ML IJ SOLN
INTRAMUSCULAR | Status: DC | PRN
  Administered 2017-02-11: 4 mg via INTRAVENOUS

## 2017-02-11 MED ORDER — PROPOFOL 10 MG/ML IV BOLUS
INTRAVENOUS | Status: DC | PRN
  Administered 2017-02-11: 160 mg via INTRAVENOUS

## 2017-02-11 MED ORDER — ACETAMINOPHEN 10 MG/ML IV SOLN
INTRAVENOUS | Status: AC
  Filled 2017-02-11: qty 100

## 2017-02-11 MED ORDER — BUPIVACAINE HCL (PF) 0.5 % IJ SOLN
INTRAMUSCULAR | Status: DC | PRN
  Administered 2017-02-11: 12.5 mL

## 2017-02-11 MED ORDER — CEFAZOLIN SODIUM-DEXTROSE 2-4 GM/100ML-% IV SOLN
INTRAVENOUS | Status: AC
  Filled 2017-02-11: qty 100

## 2017-02-11 MED ORDER — DEXTROSE-NACL 5-0.45 % IV SOLN
INTRAVENOUS | Status: DC
  Administered 2017-02-11: 16:00:00 via INTRAVENOUS

## 2017-02-11 MED ORDER — CEFAZOLIN SODIUM-DEXTROSE 2-4 GM/100ML-% IV SOLN
2.0000 g | Freq: Once | INTRAVENOUS | Status: AC
  Administered 2017-02-11: 2 g via INTRAVENOUS

## 2017-02-11 MED ORDER — LABETALOL HCL 5 MG/ML IV SOLN
INTRAVENOUS | Status: DC | PRN
  Administered 2017-02-11 (×3): 5 mg via INTRAVENOUS

## 2017-02-11 MED ORDER — ONDANSETRON 4 MG PO TBDP: 4.0000 mg | ORAL_TABLET | Freq: Three times a day (TID) | ORAL | 0 refills | Status: DC | PRN

## 2017-02-11 MED ORDER — HYDROMORPHONE HCL 1 MG/ML IJ SOLN
INTRAMUSCULAR | Status: AC
  Filled 2017-02-11: qty 1

## 2017-02-11 MED ORDER — METOPROLOL TARTRATE 5 MG/5ML IV SOLN
INTRAVENOUS | Status: DC | PRN
  Administered 2017-02-11: 2 mg via INTRAVENOUS

## 2017-02-11 MED ORDER — ONDANSETRON HCL 4 MG/2ML IJ SOLN
INTRAMUSCULAR | Status: AC
  Filled 2017-02-11: qty 2

## 2017-02-11 MED ORDER — ACETAMINOPHEN 500 MG PO TABS: 1000.0000 mg | ORAL_TABLET | Freq: Three times a day (TID) | ORAL | 2 refills | Status: AC

## 2017-02-11 MED ORDER — METOPROLOL TARTRATE 5 MG/5ML IV SOLN
INTRAVENOUS | Status: AC
  Filled 2017-02-11: qty 5

## 2017-02-11 MED ORDER — LIDOCAINE-EPINEPHRINE 1 %-1:100000 IJ SOLN
INTRAMUSCULAR | Status: AC
  Filled 2017-02-11: qty 1

## 2017-02-11 MED ORDER — LIDOCAINE HCL (PF) 2 % IJ SOLN
INTRAMUSCULAR | Status: AC
  Filled 2017-02-11: qty 10

## 2017-02-11 MED ORDER — DEXTROSE 5 % AND 0.45 % NACL IV BOLUS: 250.0000 mL | Freq: Once | INTRAVENOUS | Status: DC

## 2017-02-11 MED ORDER — BUPIVACAINE HCL (PF) 0.5 % IJ SOLN
INTRAMUSCULAR | Status: AC
  Filled 2017-02-11: qty 30

## 2017-02-11 MED ORDER — HYDROMORPHONE HCL 1 MG/ML IJ SOLN
0.5000 mg | INTRAMUSCULAR | Status: AC | PRN
  Administered 2017-02-11 – 2017-02-12 (×4): 0.5 mg via INTRAVENOUS

## 2017-02-11 MED ORDER — FAMOTIDINE 20 MG PO TABS
20.0000 mg | ORAL_TABLET | Freq: Once | ORAL | Status: AC
  Administered 2017-02-11: 20 mg via ORAL

## 2017-02-11 MED ORDER — HYDROMORPHONE HCL 1 MG/ML IJ SOLN
INTRAMUSCULAR | Status: DC | PRN
  Administered 2017-02-11: .3 mg via INTRAVENOUS
  Administered 2017-02-11: .2 mg via INTRAVENOUS
  Administered 2017-02-11: 0.5 mg via INTRAVENOUS

## 2017-02-11 SURGICAL SUPPLY — 55 items
BLADE SURG SZ10 CARB STEEL (BLADE) ×12 IMPLANT
BNDG COHESIVE 4X5 TAN STRL (GAUZE/BANDAGES/DRESSINGS) ×3 IMPLANT
BNDG ESMARK 6X12 TAN STRL LF (GAUZE/BANDAGES/DRESSINGS) ×3 IMPLANT
BRACE KNEE POST OP SHORT (BRACE) IMPLANT
BRUSH SCRUB EZ  4% CHG (MISCELLANEOUS)
BRUSH SCRUB EZ 4% CHG (MISCELLANEOUS) IMPLANT
CANISTER SUCT 1200ML W/VALVE (MISCELLANEOUS) ×3 IMPLANT
CAST PADDING 6X4YD ST 30248 (SOFTGOODS) ×2
CHLORAPREP W/TINT 26ML (MISCELLANEOUS) ×6 IMPLANT
COOLER POLAR GLACIER W/PUMP (MISCELLANEOUS) ×3 IMPLANT
CUFF TOURN 24 STER (MISCELLANEOUS) ×3 IMPLANT
CUFF TOURN 30 STER DUAL PORT (MISCELLANEOUS) IMPLANT
DRAPE IMP U-DRAPE 54X76 (DRAPES) ×6 IMPLANT
DRAPE INCISE IOBAN 66X45 STRL (DRAPES) ×3 IMPLANT
DRAPE SHEET LG 3/4 BI-LAMINATE (DRAPES) ×3 IMPLANT
DRAPE SURG 17X11 SM STRL (DRAPES) ×6 IMPLANT
ELECT CAUTERY BLADE 6.4 (BLADE) ×3 IMPLANT
ELECT REM PT RETURN 9FT ADLT (ELECTROSURGICAL) ×3
ELECTRODE REM PT RTRN 9FT ADLT (ELECTROSURGICAL) ×1 IMPLANT
GAUZE PETRO XEROFOAM 1X8 (MISCELLANEOUS) ×3 IMPLANT
GAUZE SPONGE 4X4 12PLY STRL (GAUZE/BANDAGES/DRESSINGS) ×3 IMPLANT
GLOVE BIOGEL PI IND STRL 8 (GLOVE) ×3 IMPLANT
GLOVE BIOGEL PI INDICATOR 8 (GLOVE) ×6
GLOVE SURG SYN 7.5  E (GLOVE) ×6
GLOVE SURG SYN 7.5 E (GLOVE) ×3 IMPLANT
GOWN STRL REUS W/ TWL LRG LVL3 (GOWN DISPOSABLE) ×2 IMPLANT
GOWN STRL REUS W/ TWL XL LVL3 (GOWN DISPOSABLE) ×1 IMPLANT
GOWN STRL REUS W/TWL LRG LVL3 (GOWN DISPOSABLE) ×4
GOWN STRL REUS W/TWL XL LVL3 (GOWN DISPOSABLE) ×2
HANDLE YANKAUER SUCT BULB TIP (MISCELLANEOUS) ×3 IMPLANT
IMMBOLIZER KNEE 19 BLUE UNIV (SOFTGOODS) ×3 IMPLANT
KIT RM TURNOVER STRD PROC AR (KITS) ×3 IMPLANT
NDL MAYO CATGUT SZ1 (NEEDLE) ×3
NEEDLE MAYO CATGUT SZ1 (NEEDLE) ×1 IMPLANT
NS IRRIG 1000ML POUR BTL (IV SOLUTION) ×3 IMPLANT
PACK EXTREMITY ARMC (MISCELLANEOUS) ×3 IMPLANT
PAD ABD DERMACEA PRESS 5X9 (GAUZE/BANDAGES/DRESSINGS) ×3 IMPLANT
PAD CAST CTTN 4X4 STRL (SOFTGOODS) ×1 IMPLANT
PAD WRAPON POLAR KNEE (MISCELLANEOUS) ×1 IMPLANT
PADDING CAST COTTON 4X4 STRL (SOFTGOODS) ×2
PADDING CAST COTTON 6X4 ST (SOFTGOODS) ×1 IMPLANT
RETRIEVER SUT HEWSON (MISCELLANEOUS) ×3 IMPLANT
SPONGE LAP 18X18 5 PK (GAUZE/BANDAGES/DRESSINGS) ×3 IMPLANT
STAPLER SKIN PROX 35W (STAPLE) ×3 IMPLANT
STOCKINETTE BIAS CUT 6 980064 (GAUZE/BANDAGES/DRESSINGS) ×3 IMPLANT
STOCKINETTE IMPERVIOUS 9X36 MD (GAUZE/BANDAGES/DRESSINGS) ×3 IMPLANT
SUT ETHIBOND #5 BRAIDED 30INL (SUTURE) ×3 IMPLANT
SUT ETHIBOND CT1 BRD #0 30IN (SUTURE) ×3 IMPLANT
SUT VIC AB 0 CT1 36 (SUTURE) ×6 IMPLANT
SUT VIC AB 2-0 CT1 27 (SUTURE) ×8
SUT VIC AB 2-0 CT1 TAPERPNT 27 (SUTURE) ×4 IMPLANT
SUT VIC AB 2-0 CT2 27 (SUTURE) ×6 IMPLANT
SUT VIC AB 3-0 SH 27 (SUTURE) ×4
SUT VIC AB 3-0 SH 27X BRD (SUTURE) ×2 IMPLANT
WRAPON POLAR PAD KNEE (MISCELLANEOUS) ×3

## 2017-02-11 NOTE — OR Nursing (Signed)
BS 116. Dr Posey Pronto notified.

## 2017-02-11 NOTE — H&P (Signed)
Paper H&P to be scanned into permanent record (from [facility] [place], [place]). H&P reviewed. No significant changes noted.

## 2017-02-11 NOTE — OR Nursing (Signed)
FSBG=82

## 2017-02-11 NOTE — Anesthesia Postprocedure Evaluation (Signed)
Anesthesia Post Note  Patient: Dustin Crane  Procedure(s) Performed: REPAIR QUADRICEP TENDON (Left )  Patient location during evaluation: PACU Anesthesia Type: General Level of consciousness: awake and alert Pain management: pain level controlled Vital Signs Assessment: post-procedure vital signs reviewed and stable Respiratory status: spontaneous breathing and respiratory function stable Cardiovascular status: stable Anesthetic complications: no     Last Vitals:  Vitals:   02/11/17 2335 02/11/17 2340  BP: (!) 162/94   Pulse: 91 86  Resp: 14 11  Temp:    SpO2: 92% 95%    Last Pain:  Vitals:   02/11/17 2340  TempSrc:   PainSc: 5                  KEPHART,WILLIAM K

## 2017-02-11 NOTE — OR Nursing (Signed)
Pt states he feels his blood sugar is dropping.   FSBS 66.  Notified Dr. Ronelle Nigh of same.  Bolus 250cc D5-1/2NS started at 1613.

## 2017-02-11 NOTE — Op Note (Signed)
DATE OF SURGERY: 02/11/2017  PRE-OP DIAGNOSIS: Left Quadriceps Tendon Rupture   POST-OP DIAGNOSIS: Left Quadriceps Tendon Rupture  PROCEDURES: Left Quadriceps Tendon Repair\  SURGEON: Cato Mulligan, MD  ASSISTANT(S): none  ANESTHESIA: Gen  TOTAL IV FLUIDS: see anesthesia record  ESTIMATED BLOOD LOSS: 50cc  TOURNIQUET TIME: 60 min  COMPLICATIONS: None apparent.   INDICATIONS: Dustin Crane is a 76 y.o. male who slipped and fell down stairs on 01/28/17. Examination was notable for an obvious gap in the quadricepss tendon at the level of the patella, displacement of the patella distally. The patient was unable to perform a straight leg raise. Surgery was recommended for quadriceps repair. After discussion of risks, benefits, and alternatives to surgery, the patient elected to proceed.    DETAILS OF PROCEDURE: Preoperative holding area and informed consent was verified.  The patient was brought to the operating room and placed supine on the table. Anesthesia was administered. Leg was prescrubbed with Hibiclens and alcohol, prepped with ChloraPrep and draped in the usual sterile fashion. He was given preoperative IV antibiotics within 30 minutes of the start of the case, and a surgical time-out occurred. A well-padded tourniquet had been placed.   The leg was elevated, exsanguinated with an Esmarch bandage and tourniquet inflated to 257mmHg. A midline incision was created on the knee ~6cm superior to the patella to the inferior pole of the patella. The retinacular layer was developed, medial and lateral, in line with the skin incision. At that point, an obvious tear of the quadriceps tendon was notable. Medial and lateral retinacular tears were also identified. There were degenerative changes in the quadriceps tendon, including calcifications or ossification. These areas were debrided to healthy, non-thickened quadriceps tendon. The patella was prepared by removing soft tissue proximally  and creating a small trough with a rongeur. Two #5 Ethibond sutures were then placed in the distal quadriceps tendon with medial Krackow stitch and a lateral Krackow stitch. A 2.0-mm drill bit was used to make 3 parallel tunnels in the patella: one central, one medial and one lateral. A suture passer was used to pass the Ethibond sutures through each of the drill holes. The 2 central sutures were brought through the central patellar tunnel and the medial and lateral sutures were brought through their respective patellar tunnels. A free needle was used to move the central sutures closer to their corresponding medial and lateral suture so there would be no knots anteriorly. The wound was thoroughly irrigated at this point. One suture from the outer and one from the inner Krackow sutures were then tied to each other inferior to the patella with the knee in full extension. The quadriceps was fully reduced to the patella. The medial and lateral retinacular layers were closed with 0 Vicryl suture in a figure of 8 fashion. The wound was irrigated again.  The patient could reach ~50 degrees of flexion before there was a significant increase in tension. Tourniquet was let down. Bleeding was controlled with electrocautery. 2-0 Vicryl was used to close the subdermal layer tissue. Staples were used to close skin. Xeroform gauze and 4x4s, ABD, Cotton wrap, ACE wrap, PolarCare, and brace locked at 0 degrees were applied. Instrument, sponge, and needle counts were correct prior to wound closure and at the conclusion of the case. The patient was then awakened from anesthesia without complication.   POST-OPERATIVE PLAN: - Lovenox 40mg /day x 4 weeks for DVT ppx - WBAT on operative lower extremity with brace locked in extension x 6  weeks - PT/OT to start next week - Follow-up with me in approximately 2 weeks   REHAB PROTOCOL    GOALS:  .A/AAROM 90-100 degrees by 6 weeks, 0-110 degrees by week 8, 0-130 degreesby week 10,  and 0-135 degrees by week 12.  Week 1-4  No active ROM knee extension.  Marland KitchenPROM knee ext to 0 degrees .AROM/AAROM knee flexion - very gently - Safe range as determined in Operative note (amount of tension-free repair). 50 degrees. .Gradually unlock brace for sitting as PROM knee flexion improves  Exercises:  .Ankle pumps .Patellar mobilizations .Hamstring stretch sitting .Gastroc stretch with towel .Heelslides .Quad sets - may add E-stim for re-education at 2-3 weeks upon MD approval .Patellar mobilization - all directions. .SLR all directions, active assistive flexion- start at 3rd post-op week - do notallow lag - use e-stim as needed after 2-3 weeks. If unable to achieve fullextension, perform SLR in knee immobilizer  Week 5: Gradually increase A/AAROM knee flexion  Exercises:  .Submaximal multi-angle isometrics (30-50% only) .Continue knee flexion ROM - rocking chair at home .Active SLR 4 way - no weight for flexion - watch for extensor lag - increaseresistance for hip abduction, adduction, and extension.  Add aquatic therapy if available. Move slowly so water is assistive and not resistive  Aquatic therapy exercises:  .With knee submerged in water, knee dangling at 80-90 degrees - slowly activelyextend knee to 0 degrees. .Water walking in chest deep water .SLR 4 way in the water with knee straight .Knee flexion in water  Week 6-8:  Brace - unlock for sitting to 90 degrees at 6 weeks. If quad control sufficient at 8 weeks unlock brace 0-90 degrees for ambulation with bilateral axillary crutches and gradually open brace as ROM improves. Progress to ambulation at 8 weeks with no crutches as quadriceps strength allows. D/C crutches and brace at 8-12 weeks depending on patient's quadriceps control. Emphasize frequent ROM exercises  Goals - Gradually increase P/A/AAROM during weeks 6-8   Exercises:  .Total gym semi squats level 3-4 .Gradually increase weight on all SLR, if no lag  present .Week 6 - bike (begin with rocking and progress to full revolutions) .Week 6 - Closed chain terminal knee extension with theraband .Week 6 - SAQ (AROM) .Week 7 - LAQ (AROM) .Week 8 - SAQ (gradually increase resistance) .Week 8 - LAQ (gradually increase resistance) .Week 8 - weight shifts .Week 8 - balance master and/or BAPS - with bilateral LE weight bearing .Week 8 - cones  Week 9-10:  Exercises:  .Total gym level 5-6 .Bilateral leg press - concentric only - no significant load work until 12 weeks.  .Weight shift on minitramp .Toe rises .Treadmill - Concentrate on pattern with eccentric knee control Week 11-16:  Exercises:  .Leg press - Gradually increase weight and begin unilateral leg press at week 12 .Wall squats .Balance activities: unilateral stance eyes open and closed, balance master .Standing minisquats .Step-ups - start concentrically, 2" to start and progress as tolerated .Week 16 - lunges .Week 16 - stairclimber/elliptical machine  CRITERIA TO START RUNNING PROGRAM  .Patient is able to walk with a normal gait pattern for at least 20 minutes withoutsymptoms and performs ADL's painfree .ROM is equal to uninvolved side, or at least 0-125 degrees .Hamstring and quadriceps strength is 70% of the uninvolved side isokinetically .Patient without pain, edema, crepitus, or giving-way

## 2017-02-11 NOTE — Anesthesia Preprocedure Evaluation (Addendum)
Anesthesia Evaluation  Patient identified by MRN, date of birth, ID band Patient awake    Reviewed: Allergy & Precautions, NPO status , Patient's Chart, lab work & pertinent test results  History of Anesthesia Complications Negative for: history of anesthetic complications  Airway Mallampati: II       Dental   Pulmonary neg sleep apnea, neg COPD, former smoker,           Cardiovascular hypertension, Pt. on medications + CAD, + Past MI and + Cardiac Stents  (-) CHF (-) dysrhythmias (-) Valvular Problems/Murmurs     Neuro/Psych neg Seizures    GI/Hepatic Neg liver ROS, GERD  Medicated and Controlled,  Endo/Other  diabetes, Type 2, Oral Hypoglycemic Agents  Renal/GU negative Renal ROS     Musculoskeletal   Abdominal   Peds  Hematology   Anesthesia Other Findings   Reproductive/Obstetrics                            Anesthesia Physical Anesthesia Plan  ASA: III  Anesthesia Plan: General   Post-op Pain Management:    Induction: Intravenous  PONV Risk Score and Plan: 2 and Dexamethasone and Ondansetron  Airway Management Planned: Oral ETT  Additional Equipment:   Intra-op Plan:   Post-operative Plan:   Informed Consent: I have reviewed the patients History and Physical, chart, labs and discussed the procedure including the risks, benefits and alternatives for the proposed anesthesia with the patient or authorized representative who has indicated his/her understanding and acceptance.     Plan Discussed with:   Anesthesia Plan Comments:         Anesthesia Quick Evaluation

## 2017-02-11 NOTE — Transfer of Care (Signed)
Immediate Anesthesia Transfer of Care Note  Patient: Dustin Crane  Procedure(s) Performed: REPAIR QUADRICEP TENDON (Left )  Patient Location: PACU  Anesthesia Type:General  Level of Consciousness: awake, oriented and patient cooperative  Airway & Oxygen Therapy: Patient Spontanous Breathing  Post-op Assessment: Report given to RN, Post -op Vital signs reviewed and stable and Patient moving all extremities  Post vital signs: Reviewed and stable  Last Vitals:  Vitals:   02/11/17 2305 02/11/17 2310  BP: (!) 174/90   Pulse: 87 87  Resp: 17 (!) 21  Temp: 36.7 C   SpO2: (P) 97% 93%    Last Pain:  Vitals:   02/11/17 1341  TempSrc: Tympanic         Complications: No apparent anesthesia complications

## 2017-02-11 NOTE — Anesthesia Post-op Follow-up Note (Signed)
Anesthesia QCDR form completed.        

## 2017-02-11 NOTE — OR Nursing (Signed)
Dr Ronelle Nigh notified of blood sugar 75. Order received.

## 2017-02-11 NOTE — Anesthesia Procedure Notes (Signed)
Procedure Name: Intubation Date/Time: 02/11/2017 9:12 PM Performed by: Lowry Bowl, CRNA Pre-anesthesia Checklist: Patient identified, Emergency Drugs available, Suction available and Patient being monitored Patient Re-evaluated:Patient Re-evaluated prior to induction Oxygen Delivery Method: Circle system utilized Preoxygenation: Pre-oxygenation with 100% oxygen Induction Type: IV induction and Cricoid Pressure applied Ventilation: Mask ventilation without difficulty Laryngoscope Size: Mac and 4 Grade View: Grade II Tube type: Oral Tube size: 7.5 mm Number of attempts: 1 Airway Equipment and Method: Stylet Placement Confirmation: ETT inserted through vocal cords under direct vision,  positive ETCO2 and breath sounds checked- equal and bilateral Secured at: 22 cm Tube secured with: Tape Dental Injury: Teeth and Oropharynx as per pre-operative assessment

## 2017-02-12 ENCOUNTER — Encounter: Payer: Self-pay | Admitting: Orthopedic Surgery

## 2017-02-12 DIAGNOSIS — S76112A Strain of left quadriceps muscle, fascia and tendon, initial encounter: Secondary | ICD-10-CM | POA: Diagnosis not present

## 2017-02-12 MED ORDER — HYDROMORPHONE HCL 1 MG/ML IJ SOLN
INTRAMUSCULAR | Status: AC
  Filled 2017-02-12: qty 1

## 2017-02-12 NOTE — Discharge Instructions (Signed)
AMBULATORY SURGERY  DISCHARGE INSTRUCTIONS   1) The drugs that you were given will stay in your system until tomorrow so for the next 24 hours you should not:  A) Drive an automobile B) Make any legal decisions C) Drink any alcoholic beverage   2) You may resume regular meals tomorrow.  Today it is better to start with liquids and gradually work up to solid foods.  You may eat anything you prefer, but it is better to start with liquids, then soup and crackers, and gradually work up to solid foods.   3) Please notify your doctor immediately if you have any unusual bleeding, trouble breathing, redness and pain at the surgery site, drainage, fever, or pain not relieved by medication.    4) Additional Instructions:        Please contact your physician with any problems or Same Day Surgery at [phone], Monday through Friday 6 am to 4 pm, or [place] [facility] number at [phone].Post-Op Instructions  1. Bracing or crutches: You will be provided with a long brace (from hip to ankle) and crutches.  2. Ice: You will be provided with a device [facility]) that allows you to ice the affected area effectively.   3. Showering: Incision must remain dry for 5 days. Afterwards, you may shower and gently pat incision dry. NO submerging wound for 4 weeks. Staples will be removed at your first post-operative appointment in 2 weeks.   4. Driving: You will be given specific driving precautions at discharge. Plan on not driving for at least one week for left knee surgery, and 4 weeks for right knee surgery if you are restricted due to the brace and knee motion. Please note that you are advised NOT to drive while taking narcotic pain medications as you may be impaired and unsafe to drive.  5. Activity: Weight bearing: Weight bearing as tolerated with brace locked in extension. Bending the knee is limited and will be guided by the physical therapist. Elevate knee above heart level as  much as possible for one week. Avoid standing more than 5 minutes (consecutively) for the first week. No exercise involving the knee until cleared by the surgeon or physical therapist.  Avoid long distance travel for 4 weeks.  6. Medications: - You have been provided a prescription for narcotic pain medicine. After surgery, take 1-2 narcotic tablets every 4 hours if needed for severe pain.  - A prescription for anti-nausea medication will be provided in case the narcotic medicine causes nausea - take 1 tablet every 6 hours only if nauseated.  - Take Lovenox injections daily for 4 weeks to prevent blood clots.  -Take tylenol 1000 every 8 hours for pain.  May stop tylenol 3 days after surgery or when you are having minimal pain. -DO NOT TAKE IBUPROFEN, ALEVE or OTHER NSAIDs as they can interfere with bone healing.    If you are taking prescription medication for anxiety, depression, insomnia, muscle spasm, chronic pain, or for attention deficit disorder, you are advised that you are at a higher risk of adverse effects with use of narcotics post-op, including narcotic addiction/dependence, depressed breathing, death. If you use non-prescribed substances: alcohol, marijuana, cocaine, heroin, methamphetamines, etc., you are at a higher risk of adverse effects with use of narcotics post-op, including narcotic addiction/dependence, depressed breathing, death. You are advised that taking > 50 morphine milligram equivalents (MME) of narcotic pain medication per day results in twice the risk of overdose or death. For your  prescription provided: oxycodone 5 mg - taking more than 6 tablets per day would result in > 50 morphine milligram equivalents (MME) of narcotic pain medication. Be advised that we will prescribe narcotics short-term, for acute post-operative pain, only 3 weeks for major operations such as knee repair/reconstruction surgeries.   7. Bandages: The physical therapist should change the bandages at  the first post-op appointment. If needed, the dressing supplies have been provided to you.  8. Physical Therapy: 2 times per week for the first 4 weeks, then 1-2 times per week from weeks 4-8 post-op. Therapy typically starts on post operative Day 3 or 4. You have been provided an order for physical therapy today and should schedule your appointments in advance to avoid delay. The therapist will provide home exercises.  9. Work or School: For most, but not all procedures, we advise staying out of work or school for at least 1 to 2 weeks in order to recover from the stress of surgery and to allow time for healing and swelling control. If you need a work or school note this can be provided.   10. Post-Op Appointments: Your first post-op appointment will be with Dr. Posey Pronto in approximately 2 weeks time. Please double check if this will be at the [facility] facility (Tuesdays and Thursdays) or [facility] facility (Wednesdays).   If you find that they have not been scheduled please call the Orthopaedic Appointment front desk at [phone].

## 2017-04-21 DIAGNOSIS — M25562 Pain in left knee: Secondary | ICD-10-CM | POA: Insufficient documentation

## 2017-10-26 DIAGNOSIS — R0789 Other chest pain: Secondary | ICD-10-CM | POA: Insufficient documentation

## 2018-10-07 DIAGNOSIS — G459 Transient cerebral ischemic attack, unspecified: Secondary | ICD-10-CM

## 2018-10-07 HISTORY — DX: Transient cerebral ischemic attack, unspecified: G45.9

## 2018-10-19 ENCOUNTER — Other Ambulatory Visit: Payer: Self-pay | Admitting: Internal Medicine

## 2018-10-19 ENCOUNTER — Other Ambulatory Visit (HOSPITAL_COMMUNITY): Payer: Self-pay | Admitting: Internal Medicine

## 2018-10-19 DIAGNOSIS — G459 Transient cerebral ischemic attack, unspecified: Secondary | ICD-10-CM

## 2018-10-24 ENCOUNTER — Other Ambulatory Visit: Payer: Self-pay

## 2018-10-24 ENCOUNTER — Ambulatory Visit
Admission: RE | Admit: 2018-10-24 | Discharge: 2018-10-24 | Disposition: A | Discharge status: Home / Self Care | Payer: Medicare Other | Source: Ambulatory Visit | Attending: Internal Medicine | Admitting: Internal Medicine

## 2018-10-24 DIAGNOSIS — G459 Transient cerebral ischemic attack, unspecified: Secondary | ICD-10-CM | POA: Diagnosis present

## 2018-11-06 ENCOUNTER — Other Ambulatory Visit: Payer: Self-pay

## 2018-11-06 ENCOUNTER — Ambulatory Visit
Admission: EM | Admit: 2018-11-06 | Discharge: 2018-11-06 | Disposition: A | Discharge status: Home / Self Care | Payer: Medicare Other | Attending: Family Medicine | Admitting: Family Medicine

## 2018-11-06 DIAGNOSIS — H60501 Unspecified acute noninfective otitis externa, right ear: Secondary | ICD-10-CM | POA: Diagnosis not present

## 2018-11-06 DIAGNOSIS — H6691 Otitis media, unspecified, right ear: Secondary | ICD-10-CM

## 2018-11-06 MED ORDER — AMOXICILLIN 875 MG PO TABS: 875.0000 mg | ORAL_TABLET | Freq: Two times a day (BID) | ORAL | 0 refills | Status: DC

## 2018-11-06 MED ORDER — OFLOXACIN 0.3 % OT SOLN: 10.0000 [drp] | Freq: Every day | OTIC | 0 refills | Status: AC

## 2018-11-06 NOTE — ED Triage Notes (Signed)
Pt c/o right ear pain for the past 3-4 days, states it has swollen closed almost

## 2018-11-06 NOTE — ED Provider Notes (Signed)
[facility] ____________________________________________  Time seen: Approximately 4:41 PM  I have reviewed the triage vital signs and the nursing notes.   HISTORY  Chief Complaint Otalgia   HPI Dustin Crane is a 78 y.o. male presenting for evaluation of right ear discomfort present for the last 2 to 3 days.  States ear feels muffled and some fluid sensation.  Has cleaned with peroxide without resolution.  Pain is aching and mild to moderate.  No injury or trauma.  Hearing slightly decreased.  Denies accompanying cough, congestion, fevers or other sickness.  States has been using earplugs a lot recently due to his wife snoring.  Denies other aggravating alleviating factors.  Reports otherwise doing well.    Past Medical History:  Diagnosis Date  . Arthritis   . Coronary artery disease   . Diabetes mellitus without complication ([place])   . GERD (gastroesophageal reflux disease)   . Hyperlipemia   . Hypertension   . Wears glasses   . Wears hearing aid    both ears    There are no active problems to display for this patient.   Past Surgical History:  Procedure Laterality Date  . CARDIAC CATHETERIZATION  2005   stents x2  . COLONOSCOPY    . FINGER ARTHROPLASTY Right 12/05/2012   Procedure: RIGHT INDEX FINGER IMPLANT ARTHROPLASTY;  Surgeon: Cammie Sickle., MD;  Location: [facility];  Service: Orthopedics;  Laterality: Right;  . HERNIA REPAIR  1990   rt ing   . QUADRICEPS TENDON REPAIR Left 02/11/2017   Procedure: REPAIR QUADRICEP TENDON;  Surgeon: Leim Fabry, MD;  Location: [facility];  Service: Orthopedics;  Laterality: Left;  . SHOULDER ACROMIOPLASTY  1967   [place]   . SHOULDER ARTHROSCOPY  2012   right  . TONSILLECTOMY       No current facility-administered medications for this encounter.   Current Outpatient Medications:  .  amoxicillin (AMOXIL) 875 MG tablet, Take 1 tablet (875 mg total) by mouth 2 (two) times  daily., Disp: 20 tablet, Rfl: 0 .  aspirin 81 MG tablet, Take 81 mg by mouth daily., Disp: , Rfl:  .  atorvastatin (LIPITOR) 40 MG tablet, Take 80 mg by mouth daily. , Disp: , Rfl:  .  enoxaparin (LOVENOX) 40 MG/0.4ML injection, Inject 0.4 mLs (40 mg total) into the skin daily for 28 days., Disp: 11.2 mL, Rfl: 0 .  glimepiride (AMARYL) 1 MG tablet, Take 1 mg by mouth daily. , Disp: , Rfl:  .  metFORMIN (GLUCOPHAGE) 500 MG tablet, Take 1,000 mg by mouth 2 (two) times daily with a meal., Disp: , Rfl:  .  Multiple Vitamins-Minerals (MULTIVITAMIN WITH MINERALS) tablet, Take 0.5 tablets by mouth 2 (two) times daily. , Disp: , Rfl:  .  naproxen sodium (ALEVE) 220 MG tablet, Take 440 mg by mouth 2 (two) times daily as needed (for pain or headache)., Disp: , Rfl:  .  ofloxacin (FLOXIN) 0.3 % OTIC solution, Place 10 drops into the right ear daily for 7 days., Disp: 5 mL, Rfl: 0 .  Omega-3 Fatty Acids (FISH OIL) 1200 MG CAPS, Take 1,200 mg by mouth 2 (two) times daily., Disp: , Rfl:  .  omeprazole (PRILOSEC) 20 MG capsule, Take 20 mg by mouth daily. , Disp: , Rfl:  .  ondansetron (ZOFRAN ODT) 4 MG disintegrating tablet, Take 1 tablet (4 mg total) by mouth every 8 (eight) hours as needed for nausea or vomiting., Disp: 20  tablet, Rfl: 0 .  ramipril (ALTACE) 10 MG capsule, Take 10 mg by mouth every evening., Disp: , Rfl:  .  sitaGLIPtin (JANUVIA) 100 MG tablet, Take 100 mg by mouth daily., Disp: , Rfl:  .  tamsulosin (FLOMAX) 0.4 MG CAPS capsule, Take 0.4 mg by mouth daily., Disp: , Rfl:   Allergies Influenza vac split [flu virus vaccine] and Compazine [prochlorperazine edisylate]  No family history on file.  Social History Social History   Tobacco Use  . Smoking status: Former Smoker    Quit date: 1978    Years since quitting: 42.6  . Smokeless tobacco: Never Used  Substance Use Topics  . Alcohol use: Yes    Comment: occ  . Drug use: No    Review of Systems Constitutional: No fever ENT: No  sore throat.As above.  Cardiovascular: Denies chest pain. Respiratory: Denies shortness of breath. Skin: Negative for rash.   ____________________________________________   PHYSICAL EXAM:  VITAL SIGNS: ED Triage Vitals  Enc Vitals Group     BP 11/06/18 1626 (!) 156/86     Pulse Rate 11/06/18 1626 78     Resp 11/06/18 1626 18     Temp 11/06/18 1626 98.4 F (36.9 C)     Temp Source 11/06/18 1626 Oral     SpO2 11/06/18 1626 98 %     Weight 11/06/18 1627 181 lb (82.1 kg)     Height 11/06/18 1627 5\' 8"  (1.727 m)     Head Circumference --      Peak Flow --      Pain Score 11/06/18 1627 7     Pain Loc --      Pain Edu? --      Excl. in [place]? --     Constitutional: Alert and oriented. Well appearing and in no acute distress. Eyes: Conjunctivae are normal.  ENT      Head: Normocephalic and atraumatic.      Ears:  Left: Nontender, normal canal, no erythema, normal TM.  Right: Mild tenderness with auricle movement, mild erythema and canal edema with scant exudate, TM erythematous and dull, TM appears intact.      Nose: No congestion/rhinnorhea. Cardiovascular: Normal rate, regular rhythm. Grossly normal heart sounds.  Good peripheral circulation. Respiratory: Normal respiratory effort without tachypnea nor retractions. Breath sounds are clear and equal bilaterally. No wheezes, rales, rhonchi. Musculoskeletal:Steady gait.  Neurologic:  Normal speech and language. No gross focal neurologic deficits are appreciated. Skin:  Skin is warm, dry Psychiatric: Mood and affect are normal. Speech and behavior are normal. Patient exhibits appropriate insight and judgment   ___________________________________________   LABS (all labs ordered are listed, but only abnormal results are displayed)  Labs Reviewed - No data to display  PROCEDURES Procedures   INITIAL IMPRESSION / ASSESSMENT AND PLAN / ED COURSE  Pertinent labs & imaging results that were available during my care of the  patient were reviewed by me and considered in my medical decision making (see chart for details).  Well-appearing patient.  No acute distress.  Right otitis externa and right otitis media.  Will treat with oral amoxicillin and ofloxacin.  Supportive care.  Avoidance of earplugs inserts.  Monitoring.Discussed indication, risks and benefits of medications with patient.  Discussed follow up with Primary care physician this week. Discussed follow up and return parameters including no resolution or any worsening concerns. Patient verbalized understanding and agreed to plan.   ____________________________________________   FINAL CLINICAL IMPRESSION(S) / ED DIAGNOSES  Final diagnoses:  Acute otitis externa of right ear, unspecified type  Right otitis media, unspecified otitis media type     ED Discharge Orders         Ordered    amoxicillin (AMOXIL) 875 MG tablet  2 times daily     11/06/18 1640    ofloxacin (FLOXIN) 0.3 % OTIC solution  Daily     11/06/18 1640           Note: This dictation was prepared with Dragon dictation along with smaller phrase technology. Any transcriptional errors that result from this process are unintentional.         Marylene Land, NP 11/06/18 2017

## 2018-11-06 NOTE — Discharge Instructions (Addendum)
Take medication as prescribed. Monitor. Avoid ear plugs.  Follow up with your primary care physician this week as needed. Return to Urgent care for new or worsening concerns.

## 2018-11-23 ENCOUNTER — Other Ambulatory Visit: Payer: Self-pay | Admitting: Podiatry

## 2018-11-23 NOTE — Discharge Instructions (Signed)
[facility] °[facility] ° °POST OPERATIVE INSTRUCTIONS FOR DR. TROXLER, DR. FOWLER, AND DR. BAKER °[facility] ° ° °1. Take your medication as prescribed.  Pain medication should be taken only as needed. ° °2. Keep the dressing clean, dry and intact. ° °3. Keep your foot elevated above the heart level for the first 48 hours. ° °4. Walking to the bathroom and brief periods of walking are acceptable, unless we have instructed you to be non-weight bearing. ° °5. Always wear your post-op shoe when walking.  Always use your crutches if you are to be non-weight bearing. ° °6. Do not take a shower. Baths are permissible as long as the foot is kept out of the water.  ° °7. Every hour you are awake:  °- Bend your knee 15 times. °- Flex foot 15 times °- Massage calf 15 times ° °8. Call [facility] ([phone]) if any of the following problems occur: °- You develop a temperature or fever. °- The bandage becomes saturated with blood. °- Medication does not stop your pain. °- Injury of the foot occurs. °- Any symptoms of infection including redness, odor, or red streaks running from wound. ° °General Anesthesia, Adult, Care After °This sheet gives you information about how to care for yourself after your procedure. Your health care provider may also give you more specific instructions. If you have problems or questions, contact your health care provider. °What can I expect after the procedure? °After the procedure, the following side effects are common: °· Pain or discomfort at the IV site. °· Nausea. °· Vomiting. °· Sore throat. °· Trouble concentrating. °· Feeling cold or chills. °· Weak or tired. °· Sleepiness and fatigue. °· Soreness and body aches. These side effects can affect parts of the body that were not involved in surgery. °Follow these instructions at home: ° °For at least 24 hours after the procedure: °· Have a responsible adult stay with you. It is  important to have someone help care for you until you are awake and alert. °· Rest as needed. °· Do not: °? Participate in activities in which you could fall or become injured. °? Drive. °? Use heavy machinery. °? Drink alcohol. °? Take sleeping pills or medicines that cause drowsiness. °? Make important decisions or sign legal documents. °? Take care of children on your own. °Eating and drinking °· Follow any instructions from your health care provider about eating or drinking restrictions. °· When you feel hungry, start by eating small amounts of foods that are soft and easy to digest (bland), such as toast. Gradually return to your regular diet. °· Drink enough fluid to keep your urine pale yellow. °· If you vomit, rehydrate by drinking water, juice, or clear broth. °General instructions °· If you have sleep apnea, surgery and certain medicines can increase your risk for breathing problems. Follow instructions from your health care provider about wearing your sleep device: °? Anytime you are sleeping, including during daytime naps. °? While taking prescription pain medicines, sleeping medicines, or medicines that make you drowsy. °· Return to your normal activities as told by your health care provider. Ask your health care provider what activities are safe for you. °· Take over-the-counter and prescription medicines only as told by your health care provider. °· If you smoke, do not smoke without supervision. °· Keep all follow-up visits as told by your health care provider. This is important. °Contact a health care provider if: °· You   have nausea or vomiting that does not get better with medicine. °· You cannot eat or drink without vomiting. °· You have pain that does not get better with medicine. °· You are unable to pass urine. °· You develop a skin rash. °· You have a fever. °· You have redness around your IV site that gets worse. °Get help right away if: °· You have difficulty breathing. °· You have chest  pain. °· You have blood in your urine or stool, or you vomit blood. °Summary °· After the procedure, it is common to have a sore throat or nausea. It is also common to feel tired. °· Have a responsible adult stay with you for the first 24 hours after general anesthesia. It is important to have someone help care for you until you are awake and alert. °· When you feel hungry, start by eating small amounts of foods that are soft and easy to digest (bland), such as toast. Gradually return to your regular diet. °· Drink enough fluid to keep your urine pale yellow. °· Return to your normal activities as told by your health care provider. Ask your health care provider what activities are safe for you. °This information is not intended to replace advice given to you by your health care provider. Make sure you discuss any questions you have with your health care provider. °Document Released: 05/31/2000 Document Revised: 02/25/2017 Document Reviewed: 10/08/2016 °Elsevier Patient Education © 2020 [employer] ° °

## 2018-11-27 ENCOUNTER — Ambulatory Visit (INDEPENDENT_AMBULATORY_CARE_PROVIDER_SITE_OTHER): Payer: Medicare Other | Admitting: Vascular Surgery

## 2018-11-27 ENCOUNTER — Other Ambulatory Visit: Admission: RE | Admit: 2018-11-27 | Payer: Medicare Other | Source: Ambulatory Visit

## 2018-11-27 ENCOUNTER — Other Ambulatory Visit: Payer: Self-pay

## 2018-11-27 ENCOUNTER — Encounter (INDEPENDENT_AMBULATORY_CARE_PROVIDER_SITE_OTHER): Payer: Self-pay | Admitting: Vascular Surgery

## 2018-11-27 VITALS — BP 128/78 | HR 69 | Resp 16 | Ht 68.5 in | Wt 180.0 lb

## 2018-11-27 DIAGNOSIS — I25118 Atherosclerotic heart disease of native coronary artery with other forms of angina pectoris: Secondary | ICD-10-CM | POA: Diagnosis not present

## 2018-11-27 DIAGNOSIS — M67919 Unspecified disorder of synovium and tendon, unspecified shoulder: Secondary | ICD-10-CM | POA: Insufficient documentation

## 2018-11-27 DIAGNOSIS — M542 Cervicalgia: Secondary | ICD-10-CM | POA: Insufficient documentation

## 2018-11-27 DIAGNOSIS — D649 Anemia, unspecified: Secondary | ICD-10-CM | POA: Insufficient documentation

## 2018-11-27 DIAGNOSIS — M1990 Unspecified osteoarthritis, unspecified site: Secondary | ICD-10-CM | POA: Insufficient documentation

## 2018-11-27 DIAGNOSIS — I2510 Atherosclerotic heart disease of native coronary artery without angina pectoris: Secondary | ICD-10-CM | POA: Insufficient documentation

## 2018-11-27 DIAGNOSIS — E782 Mixed hyperlipidemia: Secondary | ICD-10-CM

## 2018-11-27 DIAGNOSIS — I6523 Occlusion and stenosis of bilateral carotid arteries: Secondary | ICD-10-CM | POA: Diagnosis not present

## 2018-11-27 DIAGNOSIS — E1151 Type 2 diabetes mellitus with diabetic peripheral angiopathy without gangrene: Secondary | ICD-10-CM

## 2018-11-27 DIAGNOSIS — M75120 Complete rotator cuff tear or rupture of unspecified shoulder, not specified as traumatic: Secondary | ICD-10-CM | POA: Insufficient documentation

## 2018-11-27 DIAGNOSIS — I495 Sick sinus syndrome: Secondary | ICD-10-CM | POA: Insufficient documentation

## 2018-11-27 DIAGNOSIS — K219 Gastro-esophageal reflux disease without esophagitis: Secondary | ICD-10-CM

## 2018-11-27 DIAGNOSIS — I10 Essential (primary) hypertension: Secondary | ICD-10-CM

## 2018-11-27 DIAGNOSIS — I6529 Occlusion and stenosis of unspecified carotid artery: Secondary | ICD-10-CM | POA: Insufficient documentation

## 2018-11-27 NOTE — Progress Notes (Signed)
MRN : [id number]  Dustin Crane is a 78 y.o. (1941-01-31) male who presents with chief complaint of  Chief Complaint  Patient presents with  . New Patient (Initial Visit)    ref [facility] for carotid  .  History of Present Illness:   The patient is seen for evaluation of carotid stenosis. The carotid stenosis was identified after the patient had an episode of aphasia in early August, 2020.  The patient denies amaurosis fugax. There is no recent history of TIA symptoms or focal motor deficits. There is no prior documented CVA.  There is no history of migraine headaches. There is no history of seizures.  The patient is taking enteric-coated aspirin 81 mg daily.  The patient has a history of coronary artery disease, no recent episodes of angina or shortness of breath. The patient denies PAD or claudication symptoms. There is a history of hyperlipidemia which is being treated with a statin.   I have personally reviewed the duplex ultrasound date November 07, 2018 the impression is somewhat confusing describing a large amount of plaque but then clearly stating there is no hemodynamically significant stenosis.  Review of the velocities are more consistent with a 1 to 39% diameter reduction I concur with the portion of the impression that states there is no hemodynamically significant stenosis.   Current Meds  Medication Sig  . aspirin 81 MG tablet Take 81 mg by mouth daily.  [name] atorvastatin (LIPITOR) 40 MG tablet Take 80 mg by mouth daily.   [name] glimepiride (AMARYL) 1 MG tablet Take 1 mg by mouth daily.   . metFORMIN (GLUCOPHAGE) 500 MG tablet Take 1,000 mg by mouth 2 (two) times daily with a meal.  . Multiple Vitamins-Minerals (MULTIVITAMIN WITH MINERALS) tablet Take 0.5 tablets by mouth 2 (two) times daily.   . naproxen sodium (ALEVE) 220 MG tablet Take 440 mg by mouth 2 (two) times daily as needed (for pain or headache).  . Omega-3 Fatty Acids (FISH OIL) 1200 MG CAPS Take 1,200 mg by  mouth 2 (two) times daily.  [name] omeprazole (PRILOSEC) 20 MG capsule Take 20 mg by mouth daily.   . ramipril (ALTACE) 10 MG capsule Take 10 mg by mouth every evening.  . sitaGLIPtin (JANUVIA) 100 MG tablet Take 100 mg by mouth daily.  . tamsulosin (FLOMAX) 0.4 MG CAPS capsule Take 0.4 mg by mouth daily.    Past Medical History:  Diagnosis Date  . Arthritis   . Coronary artery disease   . Diabetes mellitus without complication ([place])   . GERD (gastroesophageal reflux disease)   . Hyperlipemia   . Hypertension   . TIA (transient ischemic attack) 10/2018  . Wears glasses   . Wears hearing aid    both ears    Past Surgical History:  Procedure Laterality Date  . CARDIAC CATHETERIZATION  2005   stents x2  . COLONOSCOPY    . FINGER ARTHROPLASTY Right 12/05/2012   Procedure: RIGHT INDEX FINGER IMPLANT ARTHROPLASTY;  Surgeon: Cammie Sickle., MD;  Location: [facility];  Service: Orthopedics;  Laterality: Right;  . HERNIA REPAIR  1990   rt ing   . QUADRICEPS TENDON REPAIR Left 02/11/2017   Procedure: REPAIR QUADRICEP TENDON;  Surgeon: Leim Fabry, MD;  Location: [facility];  Service: Orthopedics;  Laterality: Left;  . SHOULDER ACROMIOPLASTY  1967   [place]   . SHOULDER ARTHROSCOPY  2012   right  . TONSILLECTOMY  Social History Social History   Tobacco Use  . Smoking status: Former Smoker    Quit date: 1978    Years since quitting: 42.7  . Smokeless tobacco: Never Used  Substance Use Topics  . Alcohol use: Yes    Comment: occ  . Drug use: No    Family History Family History  Problem Relation Age of Onset  . Coronary artery disease Mother   . Diabetes Mother   . Alzheimer's disease Mother   . Heart disease Father   . Diabetes Father   No family history of bleeding/clotting disorders, porphyria or autoimmune disease   Allergies  Allergen Reactions  . Haemophilus Influenzae Anaphylaxis and Shortness Of Breath    Rash-severe n/v/d  Rash-severe n/v/d Rash-severe n/v/d  Rash-severe n/v/d Rash-severe n/v/d Rash-severe n/v/d Rash-severe n/v/d Rash-severe n/v/d Rash-severe n/v/d   . Hemophilus B Polysaccharide Vaccine Anaphylaxis and Shortness Of Breath    Rash-severe n/v/d Rash-severe n/v/d   . Influenza Vac Split [Flu Virus Vaccine] Shortness Of Breath and Other (See Comments)    Rash-severe n/v/d  . Compazine [Prochlorperazine Edisylate] Other (See Comments)    Muscle tightness  . Hydroxyzine     Other reaction(s): Other (See Comments), Unknown  . Prochlorperazine Other (See Comments)    Other reaction(s): Other (See Comments) Muscle tightness Muscle tightness Muscle tightness Muscle tightness  numbness Muscle tightness Muscle tightness Muscle tightness Muscle tightness  numbness      REVIEW OF SYSTEMS (Negative unless checked)  Constitutional: [] Weight loss  [] Fever  [] Chills Cardiac: [] Chest pain   [] Chest pressure   [] Palpitations   [] Shortness of breath when laying flat   [] Shortness of breath with exertion. Vascular:  [] Pain in legs with walking   [] Pain in legs at rest  [] History of DVT   [] Phlebitis   [] Swelling in legs   [] Varicose veins   [] Non-healing ulcers Pulmonary:   [] Uses home oxygen   [] Productive cough   [] Hemoptysis   [] Wheeze  [] COPD   [] Asthma Neurologic:  [] Dizziness   [] Seizures   [] History of stroke   [] History of TIA  [] Aphasia   [] Vissual changes   [] Weakness or numbness in arm   [] Weakness or numbness in leg Musculoskeletal:   [] Joint swelling   [] Joint pain   [] Low back pain Hematologic:  [] Easy bruising  [] Easy bleeding   [] Hypercoagulable state   [] Anemic Gastrointestinal:  [] Diarrhea   [] Vomiting  [x] Gastroesophageal reflux/heartburn   [] Difficulty swallowing. Genitourinary:  [] Chronic kidney disease   [] Difficult urination  [] Frequent urination   [] Blood in urine Skin:  [] Rashes   [] Ulcers  Psychological:  [] History of anxiety   []  History of major depression.   Physical Examination  Vitals:   11/27/18 1131  BP: 128/78  Pulse: 69  Resp: 16  Weight: 180 lb (81.6 kg)  Height: 5' 8.5" (1.74 m)   Body mass index is 26.97 kg/m. Gen: WD/WN, NAD Head: [place]/AT, No temporalis wasting.  Ear/Nose/Throat: Hearing grossly intact, nares w/o erythema or drainage, poor dentition Eyes: PER, EOMI, sclera nonicteric.  Neck: Supple, no masses.  No bruit or JVD.  Pulmonary:  Good air movement, clear to auscultation bilaterally, no use of accessory muscles.  Cardiac: RRR, normal S1, S2, no Murmurs. Vascular: no carotid bruit Vessel Right Left  Radial Palpable Palpable  Brachial Palpable Palpable  Carotid Palpable Palpable  Gastrointestinal: soft, non-distended. No guarding/no peritoneal signs.  Musculoskeletal: M/S 5/5 throughout.  No deformity or atrophy.  Neurologic: CN 2-12 intact. Pain and light touch intact in  extremities.  Symmetrical.  Speech is fluent. Motor exam as listed above. Psychiatric: Judgment intact, Mood & affect appropriate for pt's clinical situation. Dermatologic: No rashes or ulcers noted.  No changes consistent with cellulitis. Lymph : No Cervical lymphadenopathy, no lichenification or skin changes of chronic lymphedema.  CBC Lab Results  Component Value Date   HGB 13.3 12/05/2012   HCT 39.0 12/05/2012    BMET    Component Value Date/Time   NA 140 12/05/2012 0659   K 4.2 12/05/2012 0659   CL 107 12/05/2012 0659   GLUCOSE 149 (H) 12/05/2012 0659   BUN 22 12/05/2012 0659   CREATININE 1.10 12/05/2012 0659   CrCl cannot be calculated (Patient's most recent lab result is older than the maximum 21 days allowed.).  COAG No results found for: INR, PROTIME  Radiology No results found.    Assessment/Plan 1. Bilateral carotid artery stenosis Recommend:  Given the patient's asymptomatic subcritical stenosis no further invasive testing or surgery at this time.  Duplex ultrasound shows <40% stenosis bilaterally.  The  patient is cleared from a vascular standpoint for his foot surgery with Dr. Elvina Mattes.  Continue antiplatelet therapy as prescribed Continue management of CAD, HTN and Hyperlipidemia Healthy heart diet,  encouraged exercise at least 4 times per week Follow up in 6 months with duplex ultrasound and physical exam  - VAS US CAROTID; Future  2. Coronary artery disease of native artery of native heart with stable angina pectoris (HCC) Continue cardiac and antihypertensive medications as already ordered and reviewed, no changes at this time.  Continue statin as ordered and reviewed, no changes at this time  Nitrates PRN for chest pain   3. Benign essential hypertension Continue antihypertensive medications as already ordered, these medications have been reviewed and there are no changes at this time.   4. Diabetes mellitus type 2 with peripheral artery disease ([place]) Continue hypoglycemic medications as already ordered, these medications have been reviewed and there are no changes at this time.  Hgb A1C to be monitored as already arranged by primary service   5. Gastroesophageal reflux disease without esophagitis Continue PPI as already ordered, this medication has been reviewed and there are no changes at this time.  Avoidence of caffeine and alcohol  Moderate elevation of the head of the bed   6. Mixed hyperlipidemia Continue statin as ordered and reviewed, no changes at this time    Hortencia Pilar, MD  11/27/2018 12:02 PM

## 2018-11-29 NOTE — Anesthesia Preprocedure Evaluation (Addendum)
Anesthesia Evaluation   Patient awake    History of Anesthesia Complications Negative for: history of anesthetic complications  Airway Mallampati: II  TM Distance: >3 FB Neck ROM: Limited    Dental   Pulmonary former smoker (quit 1978),    Pulmonary exam normal        Cardiovascular hypertension, + CAD  Normal cardiovascular exam  ECG 04/19/18:  Normal sinus rhythm with sinus arrhythmia Normal ECG When compared with ECG of 30-Apr-2016 11:38, No significant change was found   Neuro/Psych TIA   GI/Hepatic GERD  ,  Endo/Other  diabetes, Type 2  Renal/GU      Musculoskeletal  (+) Arthritis ,   Abdominal   Peds  Hematology   Anesthesia Other Findings Vascular surgery note 11/27/18:  Assessment/Plan 1. Bilateral carotid artery stenosis Recommend:  Given the patient's asymptomatic subcritical stenosis no further invasive testing or surgery at this time.  Duplex ultrasound shows <40% stenosis bilaterally.  The patient is cleared from a vascular standpoint for his foot surgery with Dr. Elvina Mattes.  Continue antiplatelet therapy as prescribed Continue management of CAD, HTN and Hyperlipidemia Healthy heart diet,  encouraged exercise at least 4 times per week Follow up in 6 months with duplex ultrasound and physical exam  - VAS US CAROTID; Future  2. Coronary artery disease of native artery of native heart with stable angina pectoris (HCC) Continue cardiac and antihypertensive medications as already ordered and reviewed, no changes at this time.  Continue statin as ordered and reviewed, no changes at this time  Nitrates PRN for chest pain   3. Benign essential hypertension Continue antihypertensive medications as already ordered, these medications have been reviewed and there are no changes at this time.   4. Diabetes mellitus type 2 with peripheral artery disease ([place]) Continue hypoglycemic  medications as already ordered, these medications have been reviewed and there are no changes at this time.  Hgb A1C to be monitored as already arranged by primary service   5. Gastroesophageal reflux disease without esophagitis Continue PPI as already ordered, this medication has been reviewed and there are no changes at this time.  Avoidence of caffeine and alcohol  Moderate elevation of the head of the bed   6. Mixed hyperlipidemia Continue statin as ordered and reviewed, no changes at this time  Hortencia Pilar, MD  11/27/2018 12:02 PM    Cardiology note 10/12/18:  Plan  -Proceed to surgery and/or invasive procedure without restriction to pre or post operative and/or procedural care. The patient is at lowest risk possible for cardiovascular complications with surgical intervention and/or invasive procedure. Currently has no evidence active and/or significant angina and/or congestive heart failure. The patient may discontinue aspirin 4 days prior to procedure and restart at a safe period thereafter -Continue risk factor modification with medication management and other therapy listed above for coronary artery atherosclerosis which appears clinically stable at this time. We have discussed the variability in clinical presentation of cardiovascular disease and for the patient to communicate any new symptoms and or changes if they occur. -Continue to use moderate to high intensive cholesterol therapy for further future risk reduction in cardiovascular disease and complication. The patient currently understands the goals, risks, and benefits of lipid treatment. We have discussed the potential side effects profile of these medications and or symptoms. They will watch for any new symptoms. -There has been a discussion of the current guidelines for hypertension control. We will continue current medical regimen for hypertension control which will also help  in risk factor modification of  cardiovascular disease. The patient understands and agrees with the current plan. We will be watching for possible future side effects of these medications. Additional home blood pressure monitoring is recommended if able. -Continue aggressive medical management of diabetes following the ABCs for prevention of cardiovascular disease and complications. The goals set forth include a goal HbA1c of less than 7, moderate to high intensity statin use if patient can tolerate, and a goal systolic blood pressure of below 163mm.  -We have had a long discussion about the benefits of physical and occupational rehabilitation. The patient is advised and encouraged to enroll for improvements in quality of life and reduced hospitalization.  No orders of the defined types were placed in this encounter.  Return in about 6 months (around 04/14/2019).  Flossie Dibble, MD   Reproductive/Obstetrics                            Anesthesia Physical Anesthesia Plan  ASA: III  Anesthesia Plan: General and Regional   Post-op Pain Management: GA combined w/ Regional for post-op pain   Induction: Intravenous  PONV Risk Score and Plan:   Airway Management Planned: Natural Airway  Additional Equipment:   Intra-op Plan:   Post-operative Plan:   Informed Consent:   Plan Discussed with:   Anesthesia Plan Comments:         Anesthesia Quick Evaluation

## 2018-12-04 ENCOUNTER — Other Ambulatory Visit
Admission: RE | Admit: 2018-12-04 | Discharge: 2018-12-04 | Disposition: A | Discharge status: Home / Self Care | Payer: Medicare Other | Source: Ambulatory Visit | Attending: Podiatry | Admitting: Podiatry

## 2018-12-04 ENCOUNTER — Other Ambulatory Visit: Payer: Self-pay

## 2018-12-04 DIAGNOSIS — M7661 Achilles tendinitis, right leg: Secondary | ICD-10-CM | POA: Diagnosis not present

## 2018-12-04 DIAGNOSIS — M7731 Calcaneal spur, right foot: Secondary | ICD-10-CM | POA: Diagnosis not present

## 2018-12-04 DIAGNOSIS — Z01812 Encounter for preprocedural laboratory examination: Secondary | ICD-10-CM | POA: Diagnosis not present

## 2018-12-04 DIAGNOSIS — Z20828 Contact with and (suspected) exposure to other viral communicable diseases: Secondary | ICD-10-CM | POA: Insufficient documentation

## 2018-12-04 LAB — SARS CORONAVIRUS 2 (TAT 6-24 HRS): SARS Coronavirus 2: NEGATIVE

## 2018-12-07 ENCOUNTER — Ambulatory Visit
Admission: RE | Admit: 2018-12-07 | Discharge: 2018-12-07 | Disposition: A | Discharge status: Home / Self Care | Payer: Medicare Other | Attending: Podiatry | Admitting: Podiatry

## 2018-12-07 ENCOUNTER — Other Ambulatory Visit: Payer: Self-pay

## 2018-12-07 ENCOUNTER — Ambulatory Visit: Payer: Medicare Other | Admitting: Anesthesiology

## 2018-12-07 ENCOUNTER — Encounter
Admission: RE | Disposition: A | Discharge status: Home / Self Care | Payer: Self-pay | Source: Home / Self Care | Attending: Podiatry

## 2018-12-07 DIAGNOSIS — Z87891 Personal history of nicotine dependence: Secondary | ICD-10-CM | POA: Diagnosis not present

## 2018-12-07 DIAGNOSIS — D649 Anemia, unspecified: Secondary | ICD-10-CM | POA: Diagnosis not present

## 2018-12-07 DIAGNOSIS — I2510 Atherosclerotic heart disease of native coronary artery without angina pectoris: Secondary | ICD-10-CM | POA: Insufficient documentation

## 2018-12-07 DIAGNOSIS — E785 Hyperlipidemia, unspecified: Secondary | ICD-10-CM | POA: Diagnosis not present

## 2018-12-07 DIAGNOSIS — M1990 Unspecified osteoarthritis, unspecified site: Secondary | ICD-10-CM | POA: Diagnosis not present

## 2018-12-07 DIAGNOSIS — Z888 Allergy status to other drugs, medicaments and biological substances status: Secondary | ICD-10-CM | POA: Diagnosis not present

## 2018-12-07 DIAGNOSIS — Z7982 Long term (current) use of aspirin: Secondary | ICD-10-CM | POA: Insufficient documentation

## 2018-12-07 DIAGNOSIS — D1631 Benign neoplasm of short bones of right lower limb: Secondary | ICD-10-CM | POA: Insufficient documentation

## 2018-12-07 DIAGNOSIS — I495 Sick sinus syndrome: Secondary | ICD-10-CM | POA: Diagnosis not present

## 2018-12-07 DIAGNOSIS — I10 Essential (primary) hypertension: Secondary | ICD-10-CM | POA: Diagnosis not present

## 2018-12-07 DIAGNOSIS — Z8249 Family history of ischemic heart disease and other diseases of the circulatory system: Secondary | ICD-10-CM | POA: Diagnosis not present

## 2018-12-07 DIAGNOSIS — E119 Type 2 diabetes mellitus without complications: Secondary | ICD-10-CM | POA: Insufficient documentation

## 2018-12-07 DIAGNOSIS — Z9861 Coronary angioplasty status: Secondary | ICD-10-CM | POA: Diagnosis not present

## 2018-12-07 DIAGNOSIS — Z887 Allergy status to serum and vaccine status: Secondary | ICD-10-CM | POA: Insufficient documentation

## 2018-12-07 DIAGNOSIS — Z79899 Other long term (current) drug therapy: Secondary | ICD-10-CM | POA: Diagnosis not present

## 2018-12-07 DIAGNOSIS — M7661 Achilles tendinitis, right leg: Secondary | ICD-10-CM | POA: Insufficient documentation

## 2018-12-07 DIAGNOSIS — Z820 Family history of epilepsy and other diseases of the nervous system: Secondary | ICD-10-CM | POA: Insufficient documentation

## 2018-12-07 DIAGNOSIS — Z7984 Long term (current) use of oral hypoglycemic drugs: Secondary | ICD-10-CM | POA: Insufficient documentation

## 2018-12-07 HISTORY — PX: CALCANEAL OSTEOTOMY: SHX1281

## 2018-12-07 HISTORY — PX: ACHILLES TENDON SURGERY: SHX542

## 2018-12-07 LAB — GLUCOSE, CAPILLARY
Glucose-Capillary: 120 mg/dL — ABNORMAL HIGH (ref 70–99)
Glucose-Capillary: 159 mg/dL — ABNORMAL HIGH (ref 70–99)

## 2018-12-07 SURGERY — REPAIR, TENDON, ACHILLES
Anesthesia: Regional | Site: Foot | Laterality: Right

## 2018-12-07 MED ORDER — LIDOCAINE HCL (CARDIAC) PF 100 MG/5ML IV SOSY
PREFILLED_SYRINGE | INTRAVENOUS | Status: DC | PRN
  Administered 2018-12-07: 40 mg via INTRAVENOUS

## 2018-12-07 MED ORDER — ONDANSETRON HCL 4 MG/2ML IJ SOLN
INTRAMUSCULAR | Status: DC | PRN
  Administered 2018-12-07: 4 mg via INTRAVENOUS

## 2018-12-07 MED ORDER — FENTANYL CITRATE (PF) 100 MCG/2ML IJ SOLN: 25.0000 ug | INTRAMUSCULAR | Status: DC | PRN

## 2018-12-07 MED ORDER — ACETAMINOPHEN 160 MG/5ML PO SOLN: 325.0000 mg | ORAL | Status: DC | PRN

## 2018-12-07 MED ORDER — OXYCODONE-ACETAMINOPHEN 7.5-325 MG PO TABS: 1.0000 | ORAL_TABLET | ORAL | 0 refills | Status: DC | PRN

## 2018-12-07 MED ORDER — KETOROLAC TROMETHAMINE 30 MG/ML IJ SOLN: 30.0000 mg | Freq: Once | INTRAMUSCULAR | Status: DC | PRN

## 2018-12-07 MED ORDER — FENTANYL CITRATE (PF) 100 MCG/2ML IJ SOLN
INTRAMUSCULAR | Status: DC | PRN
  Administered 2018-12-07: 50 ug via INTRAVENOUS

## 2018-12-07 MED ORDER — ACETAMINOPHEN 325 MG PO TABS: 325.0000 mg | ORAL_TABLET | ORAL | Status: DC | PRN

## 2018-12-07 MED ORDER — CEFAZOLIN SODIUM-DEXTROSE 2-4 GM/100ML-% IV SOLN
2.0000 g | INTRAVENOUS | Status: AC
  Administered 2018-12-07: 2 g via INTRAVENOUS

## 2018-12-07 MED ORDER — PROPOFOL 500 MG/50ML IV EMUL
INTRAVENOUS | Status: DC | PRN
  Administered 2018-12-07: 120 ug/kg/min via INTRAVENOUS

## 2018-12-07 MED ORDER — ONDANSETRON HCL 4 MG/2ML IJ SOLN: 4.0000 mg | Freq: Once | INTRAMUSCULAR | Status: DC | PRN

## 2018-12-07 MED ORDER — LACTATED RINGERS IV SOLN
INTRAVENOUS | Status: DC
  Administered 2018-12-07: 07:00:00 via INTRAVENOUS

## 2018-12-07 MED ORDER — ENOXAPARIN SODIUM 40 MG/0.4ML ~~LOC~~ SOLN: 40.0000 mg | SUBCUTANEOUS | 0 refills | Status: DC

## 2018-12-07 MED ORDER — OXYCODONE HCL 5 MG PO TABS: 5.0000 mg | ORAL_TABLET | Freq: Once | ORAL | Status: DC | PRN

## 2018-12-07 MED ORDER — ROPIVACAINE HCL 5 MG/ML IJ SOLN
INTRAMUSCULAR | Status: DC | PRN
  Administered 2018-12-07: 50 mL via PERINEURAL

## 2018-12-07 MED ORDER — POVIDONE-IODINE 7.5 % EX SOLN
Freq: Once | CUTANEOUS | Status: AC
  Administered 2018-12-07: 08:00:00 via TOPICAL

## 2018-12-07 MED ORDER — OXYCODONE HCL 5 MG/5ML PO SOLN: 5.0000 mg | Freq: Once | ORAL | Status: DC | PRN

## 2018-12-07 MED ORDER — MIDAZOLAM HCL 2 MG/2ML IJ SOLN
INTRAMUSCULAR | Status: DC | PRN
  Administered 2018-12-07: 2 mg via INTRAVENOUS

## 2018-12-07 SURGICAL SUPPLY — 39 items
ANCHOR JUGGERKNOT WTAP NDL 2.9 (Anchor) ×3 IMPLANT
BANDAGE ELASTIC 4 VELCRO NS (GAUZE/BANDAGES/DRESSINGS) ×6 IMPLANT
BENZOIN TINCTURE PRP APPL 2/3 (GAUZE/BANDAGES/DRESSINGS) ×3 IMPLANT
BIT DRILL JUGRKNT W/NDL BIT2.9 (DRILL) ×1 IMPLANT
BLADE OSCILLATING/SAGITTAL (BLADE) ×2
BLADE SURG 15 STRL LF DISP TIS (BLADE) IMPLANT
BLADE SURG 15 STRL SS (BLADE)
BLADE SW THK.38XMED LNG THN (BLADE) ×1 IMPLANT
BNDG ESMARK 4X12 TAN STRL LF (GAUZE/BANDAGES/DRESSINGS) ×3 IMPLANT
BNDG GAUZE 4.5X4.1 6PLY STRL (MISCELLANEOUS) ×3 IMPLANT
BNDG STRETCH 4X75 STRL LF (GAUZE/BANDAGES/DRESSINGS) ×3 IMPLANT
CANISTER SUCT 1200ML W/VALVE (MISCELLANEOUS) ×3 IMPLANT
CLOSURE WOUND 1/4X4 (GAUZE/BANDAGES/DRESSINGS) ×1
COVER LIGHT HANDLE UNIVERSAL (MISCELLANEOUS) ×6 IMPLANT
DRILL JUGGERKNOT W/NDL BIT 2.9 (DRILL) ×3
DURAPREP 26ML APPLICATOR (WOUND CARE) ×3 IMPLANT
ELECT REM PT RETURN 9FT ADLT (ELECTROSURGICAL) ×3
ELECTRODE REM PT RTRN 9FT ADLT (ELECTROSURGICAL) ×1 IMPLANT
GAUZE SPONGE 4X4 12PLY STRL (GAUZE/BANDAGES/DRESSINGS) ×3 IMPLANT
GAUZE XEROFORM 1X8 LF (GAUZE/BANDAGES/DRESSINGS) ×3 IMPLANT
GLOVE BIO SURGEON STRL SZ8 (GLOVE) ×9 IMPLANT
GOWN STRL REUS W/ TWL LRG LVL3 (GOWN DISPOSABLE) ×1 IMPLANT
GOWN STRL REUS W/ TWL XL LVL3 (GOWN DISPOSABLE) ×1 IMPLANT
GOWN STRL REUS W/TWL LRG LVL3 (GOWN DISPOSABLE) ×2
GOWN STRL REUS W/TWL XL LVL3 (GOWN DISPOSABLE) ×2
KIT TURNOVER KIT A (KITS) ×3 IMPLANT
NS IRRIG 500ML POUR BTL (IV SOLUTION) ×3 IMPLANT
PACK EXTREMITY ARMC (MISCELLANEOUS) ×3 IMPLANT
PADDING CAST BLEND 4X4 NS (MISCELLANEOUS) ×6 IMPLANT
PENCIL SMOKE EVACUATOR (MISCELLANEOUS) ×3 IMPLANT
RASP SM TEAR CROSS CUT (RASP) ×3 IMPLANT
SPLINT CAST 1 STEP 4X30 (MISCELLANEOUS) ×3 IMPLANT
SPLINT FAST PLASTER 5X30 (CAST SUPPLIES) ×2
SPLINT PLASTER CAST FAST 5X30 (CAST SUPPLIES) ×1 IMPLANT
STOCKINETTE STRL 6IN 960660 (GAUZE/BANDAGES/DRESSINGS) ×3 IMPLANT
STRIP CLOSURE SKIN 1/4X4 (GAUZE/BANDAGES/DRESSINGS) ×2 IMPLANT
SUT VIC AB 3-0 SH 27 (SUTURE)
SUT VIC AB 3-0 SH 27X BRD (SUTURE) IMPLANT
SUT VIC AB 4-0 FS2 27 (SUTURE) IMPLANT

## 2018-12-07 NOTE — H&P (Signed)
H and P has been reviewed and no changes are noted.  

## 2018-12-07 NOTE — Anesthesia Procedure Notes (Signed)
Anesthesia Regional Block: Popliteal block   Pre-Anesthetic Checklist: ,, timeout performed, Correct Patient, Correct Site, Correct Laterality, Correct Procedure, Correct Position, site marked, Risks and benefits discussed,  Surgical consent,  Pre-op evaluation,  At surgeon's request and post-op pain management  Laterality: Right  Prep: chloraprep       Needles:  Injection technique: Single-shot  Needle Type: Stimiplex     Needle Length: 9cm      Additional Needles:   Procedures:,,,, ultrasound used (permanent image in chart),,,,  Narrative:   Performed by: Personally  Anesthesiologist: Carlos American, MD  Additional Notes: 37ml 0.5% Ropivacaine

## 2018-12-07 NOTE — Anesthesia Procedure Notes (Signed)
Anesthesia Regional Block: Adductor canal block   Pre-Anesthetic Checklist: ,, timeout performed, Correct Patient, Correct Site, Correct Laterality, Correct Procedure, Correct Position, site marked, Risks and benefits discussed,  Surgical consent,  Pre-op evaluation,  At surgeon's request and post-op pain management  Laterality: Right  Prep: chloraprep       Needles:  Injection technique: Single-shot  Needle Type: Stimiplex     Needle Length: 9cm      Additional Needles:   Procedures:,,,, ultrasound used (permanent image in chart),,,,  Narrative:  Start time: 12/07/2018 7:56 AM End time: 12/07/2018 8:03 AM Injection made incrementally with aspirations every 5 mL.  Performed by: Personally  Anesthesiologist: Carlos American, MD  Additional Notes: 29ml Ropivacaine 0.5%

## 2018-12-07 NOTE — Op Note (Signed)
Operative note   Surgeon: Dr. Albertine Patricia, DPM.    Assistant: None    Preop diagnosis: 1.  Chronic Achilles tendinitis right heel 2.  Posterior calcaneal exostosis right    Postop diagnosis: Same    Procedure:   1.  Secondary Achilles tendon repair with 2.9 juggernaut tendon anchor   2.  Resection of posterior calcaneal exostosis right heel       EBL: LESS than 5 cc    Anesthesia:IV sedation with a popliteal block also delivered by the anesthesia team.    Hemostasis: Mid calf tourniquet at 2 pressure for 15 minutes    Specimen: Degenerative tendon and bone from posterior right heel    Complications: None    Operative indications: Chronic pain discomfort unresponsive to conservative care    Procedure:  Patient was brought into the OR and placed on the operating table in theprone position. After anesthesia was obtained theright lower extremity was prepped and draped in usual sterile fashion.  Operative Report: This time attention directed to the posterior calcaneus of the right heel where a 4 cm linear incision was made along the midline of the calcaneus.  This was deepened sharp blunt dissection.  Bleeders were clamped bovied as required.  The tendon sheath peritenon was then incised longitudinally reflected medial laterally.  A longitudinal incision was then made through the distal tendon down to bone.  This was reflected medially and laterally to remove and freed up from the shelf of bone marrow developed in the region.  Once the shelf was exposed with a combination of sagittal saw power rasp were used to remove the bony shelf in toto.  Some thickened degenerative fibrotic portions of the Achilles tendon were also removed at this timeframe.  The area was checked FluoroScan and adequate reduction removal bone proliferation was noted.  There is also at this point very goodraw bone exposure to the posterior calcaneus.  Areas were copiously irrigated at this point and then a 2.9  juggernaut anchor was placed into the posterior calcaneus and this was used to suture the tendon and anchored back down the nonabsorbable sutures coming off the tendon anchor and were used to since the tendon back down to bone.     Patien Once this was accomplished 2-0 Vicryl was then used to sew and the other residual portions of the tendon into the distal soft tissue.  Copious irrigation was achieved before the anchor was placed.  The peritenon and tendon sheath were then sutured back over the distal Achilles tendon with 4-0 Vicryl continuous stitch.  Deep superficial fascial layers were closed with 4-0 Vicryl in continuous stitch.  Skin was then closed with 4-0 Vicryl in subcuticular fashion.  A posterior splint was placed on the right foot leg in the operating room.  Patient is remain nonweightbearing.  The patientt tolerated the procedure and anesthesia well.  Was transported from the OR to the PACU with all vital signs stable and vascular status intact. To be discharged per routine protocol.  Will follow up in approximately 1 week in the outpatient clinic.

## 2018-12-07 NOTE — Transfer of Care (Signed)
Immediate Anesthesia Transfer of Care Note  Patient: Dustin Crane  Procedure(s) Performed: ACHILLES TENDON REPAIR SECONDARY (Right Foot) PARTIAL CALCANECTOMY RIGHT (Right Foot)  Patient Location: PACU  Anesthesia Type: General, Regional  Level of Consciousness: awake, alert  and patient cooperative  Airway and Oxygen Therapy: Patient Spontanous Breathing and Patient connected to supplemental oxygen  Post-op Assessment: Post-op Vital signs reviewed, Patient's Cardiovascular Status Stable, Respiratory Function Stable, Patent Airway and No signs of Nausea or vomiting  Post-op Vital Signs: Reviewed and stable  Complications: No apparent anesthesia complications

## 2018-12-07 NOTE — Anesthesia Postprocedure Evaluation (Signed)
Anesthesia Post Note  Patient: Dustin Crane  Procedure(s) Performed: ACHILLES TENDON REPAIR SECONDARY (Right Foot) PARTIAL CALCANECTOMY RIGHT (Right Foot)  Patient location during evaluation: PACU Anesthesia Type: Regional and General Level of consciousness: awake Pain management: pain level controlled Vital Signs Assessment: post-procedure vital signs reviewed and stable Respiratory status: spontaneous breathing Anesthetic complications: no    Wanda Plump Natilie Krabbenhoft

## 2018-12-07 NOTE — Progress Notes (Signed)
Assisted Ben Redmon ANMD with right, ultrasound guided, popliteal/saphenous block. Side rails up, monitors on throughout procedure. See vital signs in flow sheet. Tolerated Procedure well.

## 2018-12-08 ENCOUNTER — Encounter: Payer: Self-pay | Admitting: Podiatry

## 2018-12-11 ENCOUNTER — Encounter (INDEPENDENT_AMBULATORY_CARE_PROVIDER_SITE_OTHER): Payer: Self-pay | Admitting: Vascular Surgery

## 2018-12-11 LAB — SURGICAL PATHOLOGY

## 2019-01-03 ENCOUNTER — Encounter (INDEPENDENT_AMBULATORY_CARE_PROVIDER_SITE_OTHER): Payer: Self-pay

## 2019-05-28 ENCOUNTER — Ambulatory Visit (INDEPENDENT_AMBULATORY_CARE_PROVIDER_SITE_OTHER): Payer: Medicare Other | Admitting: Vascular Surgery

## 2019-05-28 ENCOUNTER — Encounter (INDEPENDENT_AMBULATORY_CARE_PROVIDER_SITE_OTHER): Payer: Medicare Other

## 2019-06-11 ENCOUNTER — Ambulatory Visit (INDEPENDENT_AMBULATORY_CARE_PROVIDER_SITE_OTHER): Payer: Medicare Other | Admitting: Nurse Practitioner

## 2019-06-11 ENCOUNTER — Other Ambulatory Visit: Payer: Self-pay

## 2019-06-11 ENCOUNTER — Ambulatory Visit (INDEPENDENT_AMBULATORY_CARE_PROVIDER_SITE_OTHER): Payer: Medicare Other

## 2019-06-11 ENCOUNTER — Encounter (INDEPENDENT_AMBULATORY_CARE_PROVIDER_SITE_OTHER): Payer: Medicare Other

## 2019-06-11 DIAGNOSIS — I6523 Occlusion and stenosis of bilateral carotid arteries: Secondary | ICD-10-CM

## 2019-06-12 ENCOUNTER — Encounter (INDEPENDENT_AMBULATORY_CARE_PROVIDER_SITE_OTHER): Payer: Self-pay | Admitting: Vascular Surgery

## 2019-10-02 NOTE — Progress Notes (Signed)
10/03/2019 1:53 PM   Dustin Crane 21-May-1940 [id number]  Referring provider: Idelle Crouch, MD [facility] [place],  [place] [address] Chief Complaint  Patient presents with  . Elevated PSA    HPI: Dustin Crane is a 79 y.o. male presents today for evaluation and management of elevated PSA.   Patient saw Dr. Doy Hutching on 09/19/2019. He had some hesitancy and nocturia.  Patient was noted to have a rising PSA at 4.02 on 09/13/2019. UA was unremarkable.   Today the patient is doing well.   He reports a few years back when ejaculating there was no semen. He is concerned that his medication is causing his ejaculation issues. He reports being on Flomax.   He notes that Viagra did not work and he is recently been prescribed Cialis 5 mg daily but has had issues filling this prescription due to noncoverage by insurance.  He is looking for alternatives.  PSA trend: 08/02/2013: 2.45 08/16/2014: 2.09 08/11/2015: 2.32 08/20/2016: 3.23 08/17/2017: 4.48 01/16/2018: 4.82 07/14/2018: 3.27 09/13/2019: 4.02   IPSS    Row Name 10/03/19 1300         International Prostate Symptom Score   How often have you had the sensation of not emptying your bladder? Less than half the time     How often have you had to urinate less than every two hours? Less than half the time     How often have you found you stopped and started again several times when you urinated? About half the time     How often have you found it difficult to postpone urination? About half the time     How often have you had a weak urinary stream? About half the time     How often have you had to strain to start urination? Less than half the time     How many times did you typically get up at night to urinate? 2 Times     Total IPSS Score 17       Quality of Life due to urinary symptoms   If you were to spend the rest of your life with your urinary condition just the way it is now how would  you feel about that? Mostly Satisfied            Score:  1-7 Mild 8-19 Moderate 20-35 Severe   PMH: Past Medical History:  Diagnosis Date  . Arthritis   . Coronary artery disease   . Diabetes mellitus without complication ([place])   . GERD (gastroesophageal reflux disease)   . Hyperlipemia   . Hypertension   . TIA (transient ischemic attack) 10/2018  . Wears glasses   . Wears hearing aid    both ears    Surgical History: Past Surgical History:  Procedure Laterality Date  . ACHILLES TENDON SURGERY Right 12/07/2018   Procedure: ACHILLES TENDON REPAIR SECONDARY;  Surgeon: Albertine Patricia, DPM;  Location: [place];  Service: Podiatry;  Laterality: Right;  LMA LOCAL DIABETIC   block  . CALCANEAL OSTEOTOMY Right 12/07/2018   Procedure: PARTIAL CALCANECTOMY RIGHT;  Surgeon: Albertine Patricia, DPM;  Location: [place];  Service: Podiatry;  Laterality: Right;  . CARDIAC CATHETERIZATION  2005   stents x2  . COLONOSCOPY    . FINGER ARTHROPLASTY Right 12/05/2012   Procedure: RIGHT INDEX FINGER IMPLANT ARTHROPLASTY;  Surgeon: Cammie Sickle., MD;  Location: [place];  Service: Orthopedics;  Laterality:  Right;  [name] HERNIA REPAIR  1990   rt ing   . QUADRICEPS TENDON REPAIR Left 02/11/2017   Procedure: REPAIR QUADRICEP TENDON;  Surgeon: Leim Fabry, MD;  Location: [facility];  Service: Orthopedics;  Laterality: Left;  . SHOULDER ACROMIOPLASTY  1967   [place]   . SHOULDER ARTHROSCOPY  2012   right  . TONSILLECTOMY      Home Medications:  Allergies as of 10/03/2019      Reactions   Haemophilus Influenzae Anaphylaxis, Shortness Of Breath   Rash-severe n/v/d Rash-severe n/v/d Rash-severe n/v/d Rash-severe n/v/d Rash-severe n/v/d Rash-severe n/v/d Rash-severe n/v/d Rash-severe n/v/d Rash-severe n/v/d   Hemophilus B Polysaccharide Vaccine Anaphylaxis, Shortness Of Breath   Rash-severe n/v/d Rash-severe n/v/d   Influenza Vac  Split [flu Virus Vaccine] Shortness Of Breath, Other (See Comments)   Rash-severe n/v/d   Compazine [prochlorperazine Edisylate] Other (See Comments)   Muscle tightness   Hydroxyzine    Other reaction(s): Other (See Comments), Unknown   Prochlorperazine Other (See Comments)   Other reaction(s): Other (See Comments) Muscle tightness Muscle tightness Muscle tightness Muscle tightness  numbness Muscle tightness Muscle tightness Muscle tightness Muscle tightness  numbness      Medication List       Accurate as of October 03, 2019  1:53 PM. If you have any questions, ask your nurse or doctor.        aspirin 81 MG tablet Take 81 mg by mouth daily.   atorvastatin 40 MG tablet Commonly known as: LIPITOR Take 80 mg by mouth daily.   enoxaparin 40 MG/0.4ML injection Commonly known as: LOVENOX Inject 0.4 mLs (40 mg total) into the skin daily for 5 days.   Fish Oil 1200 MG Caps Take 1,200 mg by mouth 2 (two) times daily.   glimepiride 1 MG tablet Commonly known as: AMARYL Take 1 mg by mouth daily.   metFORMIN 500 MG tablet Commonly known as: GLUCOPHAGE Take 1,000 mg by mouth 2 (two) times daily with a meal.   multivitamin with minerals tablet Take 0.5 tablets by mouth 2 (two) times daily.   omeprazole 20 MG capsule Commonly known as: PRILOSEC Take 20 mg by mouth daily.   oxyCODONE-acetaminophen 7.5-325 MG tablet Commonly known as: Percocet Take 1 tablet by mouth every 4 (four) hours as needed for severe pain.   ramipril 10 MG capsule Commonly known as: ALTACE Take 10 mg by mouth every evening.   sitaGLIPtin 100 MG tablet Commonly known as: JANUVIA Take 100 mg by mouth daily.   tamsulosin 0.4 MG Caps capsule Commonly known as: FLOMAX Take 0.4 mg by mouth daily.       Allergies:  Allergies  Allergen Reactions  . Haemophilus Influenzae Anaphylaxis and Shortness Of Breath    Rash-severe n/v/d Rash-severe n/v/d Rash-severe n/v/d  Rash-severe  n/v/d Rash-severe n/v/d Rash-severe n/v/d Rash-severe n/v/d Rash-severe n/v/d Rash-severe n/v/d   . Hemophilus B Polysaccharide Vaccine Anaphylaxis and Shortness Of Breath    Rash-severe n/v/d Rash-severe n/v/d   . Influenza Vac Split [Flu Virus Vaccine] Shortness Of Breath and Other (See Comments)    Rash-severe n/v/d  . Compazine [Prochlorperazine Edisylate] Other (See Comments)    Muscle tightness  . Hydroxyzine     Other reaction(s): Other (See Comments), Unknown  . Prochlorperazine Other (See Comments)    Other reaction(s): Other (See Comments) Muscle tightness Muscle tightness Muscle tightness Muscle tightness  numbness Muscle tightness Muscle tightness Muscle tightness Muscle tightness  numbness     Family History:  Family History  Problem Relation Age of Onset  . Coronary artery disease Mother   . Diabetes Mother   . Alzheimer's disease Mother   . Heart disease Father   . Diabetes Father     Social History:  reports that he quit smoking about 43 years ago. He has never used smokeless tobacco. He reports current alcohol use. He reports that he does not use drugs.   Physical Exam: BP (!) 149/71   Pulse 94   Constitutional:  Alert and oriented, No acute distress. Patient is accompanied by Wife. HEENT: [place] AT, moist mucus membranes.  Trachea midline, no masses. Cardiovascular: No clubbing, cyanosis, or edema. Respiratory: Normal respiratory effort, no increased work of breathing. GI: Abdomen is soft, nontender, nondistended, no abdominal masses GU: No CVA tenderness Rectal: Normal sphincter tone, normal prostate, no nodules/tenderness  Skin: No rashes, bruises or suspicious lesions. Neurologic: Grossly intact, no focal deficits, moving all 4 extremities. Psychiatric: Normal mood and affect.   Pertinent Imaging: Results for orders placed or performed in visit on 10/03/19  BLADDER SCAN AMB NON-IMAGING  Result Value Ref Range   Scan Result 66ml       Assessment & Plan:   1. Elevated PSA Patient has a history of fluctuating PSA  We reviewed the implications of an elevated PSA and the uncertainty surrounding it. In general, a man's PSA increases with age and is produced by both normal and cancerous prostate tissue. Differential for elevated PSA is BPH, prostate cancer, infection, recent intercourse/ejaculation, prostate infarction, recent urethroscopic manipulation (foley placement/cystoscopy) and prostatitis. Management of an elevated PSA can include observation or prostate biopsy and wediscussed this in detail. We discussed that indications for prostate biopsy are defined by age and race specific PSA cutoffs as well as a PSA velocity of 0.75/year.  PSA today, will call with results and discuss surveillance vs biopsy.  DRE is reassuring  2. BPH with outlet obstruction Refractory symptoms as stated above.  UA today. IPSS score 17/35, moderate. PVR is 25 mL. Currently on Flomax twice daily but like to consider alternatives to being on medication.  We discussed outlet procedure such as holep, TURP, and UroLift as alternatives.  He may be interested in pursuing 1 of these options.  He will return for TRUS/cystoscopy for further evaluation.  3. Erectile dysfunction Failed Viagra Patient was unable to get approval for Cialis and agreed to start the generic brand. -Tadalafil 5 mg Rx sent, advised using good Rx   [facility] [address] [place], [place] [address] [phone]  I, Selena Batten, am acting as a scribe for Dr. Hollice Espy.  I have reviewed the above documentation for accuracy and completeness, and I agree with the above.   Hollice Espy, MD  I spent 45 total minutes on the day of the encounter including pre-visit review of the medical record, face-to-face time with the patient, and post visit ordering of labs/imaging/tests.

## 2019-10-03 ENCOUNTER — Ambulatory Visit (INDEPENDENT_AMBULATORY_CARE_PROVIDER_SITE_OTHER): Payer: Medicare Other | Admitting: Urology

## 2019-10-03 ENCOUNTER — Other Ambulatory Visit: Payer: Self-pay

## 2019-10-03 ENCOUNTER — Encounter: Payer: Self-pay | Admitting: Urology

## 2019-10-03 VITALS — BP 149/71 | HR 94

## 2019-10-03 DIAGNOSIS — I6523 Occlusion and stenosis of bilateral carotid arteries: Secondary | ICD-10-CM

## 2019-10-03 DIAGNOSIS — R972 Elevated prostate specific antigen [PSA]: Secondary | ICD-10-CM

## 2019-10-03 LAB — BLADDER SCAN AMB NON-IMAGING

## 2019-10-03 MED ORDER — TADALAFIL 5 MG PO TABS: 5.0000 mg | ORAL_TABLET | Freq: Every day | ORAL | 6 refills | Status: DC | PRN

## 2019-10-03 NOTE — Patient Instructions (Addendum)
Cystoscopy Cystoscopy is a procedure that is used to help diagnose and sometimes treat conditions that affect the lower urinary tract. The lower urinary tract includes the bladder and the urethra. The urethra is the tube that drains urine from the bladder. Cystoscopy is done using a thin, tube-shaped instrument with a light and camera at the end (cystoscope). The cystoscope may be hard or flexible, depending on the goal of the procedure. The cystoscope is inserted through the urethra, into the bladder. Cystoscopy may be recommended if you have:  Urinary tract infections that keep coming back.  Blood in the urine (hematuria).  An inability to control when you urinate (urinary incontinence) or an overactive bladder.  Unusual cells found in a urine sample.  A blockage in the urethra, such as a urinary stone.  Painful urination.  An abnormality in the bladder found during an intravenous pyelogram (IVP) or CT scan. Cystoscopy may also be done to remove a sample of tissue to be examined under a microscope (biopsy). What are the risks? Generally, this is a safe procedure. However, problems may occur, including:  Infection.  Bleeding.  What happens during the procedure?  1. You will be given one or more of the following: ? A medicine to numb the area (local anesthetic). 2. The area around the opening of your urethra will be cleaned. 3. The cystoscope will be passed through your urethra into your bladder. 4. Germ-free (sterile) fluid will flow through the cystoscope to fill your bladder. The fluid will stretch your bladder so that your health care provider can clearly examine your bladder walls. 5. Your doctor will look at the urethra and bladder. 6. The cystoscope will be removed The procedure may vary among health care providers  What can I expect after the procedure? After the procedure, it is common to have: 1. Some soreness or pain in your abdomen and urethra. 2. Urinary symptoms.  These include: ? Mild pain or burning when you urinate. Pain should stop within a few minutes after you urinate. This may last for up to 1 week. ? A small amount of blood in your urine for several days. ? Feeling like you need to urinate but producing only a small amount of urine. Follow these instructions at home: General instructions  Return to your normal activities as told by your health care provider.   Do not drive for 24 hours if you were given a sedative during your procedure.  Watch for any blood in your urine. If the amount of blood in your urine increases, call your health care provider.  If a tissue sample was removed for testing (biopsy) during your procedure, it is up to you to get your test results. Ask your health care provider, or the department that is doing the test, when your results will be ready.  Drink enough fluid to keep your urine pale yellow.  Keep all follow-up visits as told by your health care provider. This is important. Contact a health care provider if you:  Have pain that gets worse or does not get better with medicine, especially pain when you urinate.  Have trouble urinating.  Have more blood in your urine. Get help right away if you:  Have blood clots in your urine.  Have abdominal pain.  Have a fever or chills.  Are unable to urinate. Summary  Cystoscopy is a procedure that is used to help diagnose and sometimes treat conditions that affect the lower urinary tract.  Cystoscopy is done using   a thin, tube-shaped instrument with a light and camera at the end.  After the procedure, it is common to have some soreness or pain in your abdomen and urethra.  Watch for any blood in your urine. If the amount of blood in your urine increases, call your health care provider.  If you were prescribed an antibiotic medicine, take it as told by your health care provider. Do not stop taking the antibiotic even if you start to feel better. This  information is not intended to replace advice given to you by your health care provider. Make sure you discuss any questions you have with your health care provider. Document Revised: 02/14/2018 Document Reviewed: 02/14/2018 Elsevier Patient Education  2020 Elsevier

## 2019-10-04 ENCOUNTER — Telehealth: Payer: Self-pay

## 2019-10-04 DIAGNOSIS — R972 Elevated prostate specific antigen [PSA]: Secondary | ICD-10-CM

## 2019-10-04 LAB — PSA: Prostate Specific Ag, Serum: 3.6 ng/mL (ref 0.0–4.0)

## 2019-10-04 LAB — URINALYSIS, COMPLETE
Bilirubin, UA: NEGATIVE
Glucose, UA: NEGATIVE
Ketones, UA: NEGATIVE
Leukocytes,UA: NEGATIVE
Nitrite, UA: NEGATIVE
Protein,UA: NEGATIVE
RBC, UA: NEGATIVE
Specific Gravity, UA: 1.02 (ref 1.005–1.030)
Urobilinogen, Ur: 0.2 mg/dL (ref 0.2–1.0)
pH, UA: 6.5 (ref 5.0–7.5)

## 2019-10-04 LAB — MICROSCOPIC EXAMINATION: Bacteria, UA: NONE SEEN

## 2019-10-04 NOTE — Telephone Encounter (Signed)
Patient notified and scheduled, order placed

## 2019-10-04 NOTE — Telephone Encounter (Signed)
-----   Message from Hollice Espy, MD sent at 10/04/2019  9:17 AM EDT ----- PSA is down to 3.6.  Recommend f/u in 1 year with PSA prior.  Hollice Espy, MD

## 2019-10-17 ENCOUNTER — Encounter: Payer: Self-pay | Admitting: Urology

## 2019-10-29 NOTE — Progress Notes (Signed)
10/30/2019  Chief Complaint  Patient presents with  . Cysto    TRUS     HPI: Dustin Crane is a 79 y.o. male who returns for a cystoscopy and TRUS. Patient is accompanied by his wife.    Please see previous notes for details.   The patient would like to undergo the most least invasive procedure because, he is not interested in giving up his sex life with his wife.  Blood pressure 139/68, pulse 83, height 5\' 8"  (1.727 m), weight 181 lb (82.1 kg). NED. A&Ox3.   No respiratory distress   Abd soft, NT, ND Normal phallus with bilateral descended testicles    Cystoscopy Procedure Note  Patient identification was confirmed, informed consent was obtained, and patient was prepped using Betadine solution.  Lidocaine jelly was administered per urethral meatus.    Preoperative abx where received prior to procedure.     Pre-Procedure: - Inspection reveals a normal caliber ureteral meatus.  Procedure: The flexible cystoscope was introduced without difficulty - No urethral strictures/lesions are present. - Enlarged prostate  - Elevated bladder neck - Bilateral ureteral orifices identified - Bladder mucosa  reveals no ulcers, tumors, or lesions - No bladder stones - Mild trabeculation  Retroflexion shows slight elevated bladder neck and no discrete median lobe   Post-Procedure: - Patient tolerated the procedure well    Prostate transrectal ultrasound sizing   Informed consent was obtained after discussing risks/benefits of the procedure.  A time out was performed to ensure correct patient identity.   Pre-Procedure: -Transrectal probe was placed without difficulty -Transrectal Ultrasound performed revealing a 89.3 gm prostate measuring 5.47 x 5.49 x 5.71 cm (length) -No significant hypoechoic or median lobe noted      Assessment/ Plan:  1. Elevated PSA PSA was 3.6 on 10/03/19, appropriate for age and gland size  2. BPH with outlet obstruction  Discussed the treatment  HoLEP vs Urolift.   We reviewed the surgery in detail today including the preoperative, intraoperative, and postoperative course.  This will most likely be an outpatient procedure pending the degree of post op hematuria.  He will go home with catheter for a few days post op and will either be taught how to remove his own catheter or return to the office for catheter removal. Risk of bleeding, infection, damage surrounding structures, injury to the bladder/ urethral, bladder neck contracture, ureteral stricture, retrograde ejaculation, stress/ urge incontinence, exacerbation of irritative voiding symptoms were all discussed in detail.    The patient is a good candidate for both.  Patient agreed to Urolift, concerned sexual side effects  Would like to wait until early next year in light of wife's upcoming surgeries  3. Erectile dysfunction  On Tadalafil 5 mg   I, Selena Batten, am acting as a scribe for Dr. Hollice Espy.  I have reviewed the above documentation for accuracy and completeness, and I agree with the above.   Hollice Espy, MD

## 2019-10-30 ENCOUNTER — Ambulatory Visit (INDEPENDENT_AMBULATORY_CARE_PROVIDER_SITE_OTHER): Payer: Medicare Other | Admitting: Urology

## 2019-10-30 ENCOUNTER — Encounter: Payer: Self-pay | Admitting: Urology

## 2019-10-30 ENCOUNTER — Other Ambulatory Visit: Payer: Self-pay

## 2019-10-30 VITALS — BP 139/68 | HR 83 | Ht 68.0 in | Wt 181.0 lb

## 2019-10-30 DIAGNOSIS — R972 Elevated prostate specific antigen [PSA]: Secondary | ICD-10-CM

## 2019-10-31 LAB — URINALYSIS, COMPLETE
Bilirubin, UA: NEGATIVE
Glucose, UA: NEGATIVE
Ketones, UA: NEGATIVE
Leukocytes,UA: NEGATIVE
Nitrite, UA: NEGATIVE
Protein,UA: NEGATIVE
RBC, UA: NEGATIVE
Specific Gravity, UA: 1.025 (ref 1.005–1.030)
Urobilinogen, Ur: 0.2 mg/dL (ref 0.2–1.0)
pH, UA: 5 (ref 5.0–7.5)

## 2019-10-31 LAB — MICROSCOPIC EXAMINATION
Bacteria, UA: NONE SEEN
RBC, Urine: NONE SEEN /hpf (ref 0–2)

## 2019-11-26 NOTE — Telephone Encounter (Signed)
Called and left a message for call back to scheduled

## 2019-12-05 ENCOUNTER — Encounter: Payer: Self-pay | Admitting: Gastroenterology

## 2019-12-05 ENCOUNTER — Other Ambulatory Visit: Payer: Self-pay

## 2019-12-05 ENCOUNTER — Ambulatory Visit (INDEPENDENT_AMBULATORY_CARE_PROVIDER_SITE_OTHER): Payer: Medicare Other | Admitting: Gastroenterology

## 2019-12-05 ENCOUNTER — Telehealth: Payer: Self-pay | Admitting: Gastroenterology

## 2019-12-05 VITALS — BP 147/88 | HR 89 | Temp 98.0°F | Wt 180.2 lb

## 2019-12-05 DIAGNOSIS — Z1211 Encounter for screening for malignant neoplasm of colon: Secondary | ICD-10-CM

## 2019-12-05 DIAGNOSIS — I6523 Occlusion and stenosis of bilateral carotid arteries: Secondary | ICD-10-CM | POA: Diagnosis not present

## 2019-12-05 NOTE — Telephone Encounter (Signed)
Patient will be out of town this day and needs to resch his procedure. Wife states Nov 10, 11, or 12th would work. Please call pt or wife back to resch. Pt had ov today 9.29.21

## 2019-12-05 NOTE — Progress Notes (Signed)
[name] MD, MRCP(U.K) [address]  [place]  [place], [place] [address]  Main: [phone]  Fax: [phone]   Gastroenterology Consultation  Referring Provider:     Idelle Crouch, MD Primary Care Physician:  Idelle Crouch, MD Primary Gastroenterologist:  Dr. [name]  Reason for Consultation:     Colon cancer screening         HPI:   Dustin Crane is a 79 y.o. y/o male referred for consultation & management  by Dr. Doy Hutching, Leonie Douglas, MD.    He states that he is here today to discuss about colon cancer screening.  Last colonoscopy was 3 years back and he had a couple of polyps and to the buttocks that he was told to return in 3 years.  He is very active can move the lawn go up the stairs not on any blood thinners except aspirin.  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  No other complaints.  He says he is due for stress test next month which is coming up.   Past Medical History:  Diagnosis Date  . Arthritis   . Coronary artery disease   . Diabetes mellitus without complication ([place])   . GERD (gastroesophageal reflux disease)   . Hyperlipemia   . Hypertension   . TIA (transient ischemic attack) 10/2018  . Wears glasses   . Wears hearing aid    both ears    Past Surgical History:  Procedure Laterality Date  . ACHILLES TENDON SURGERY Right 12/07/2018   Procedure: ACHILLES TENDON REPAIR SECONDARY;  Surgeon: Albertine Patricia, DPM;  Location: [facility];  Service: Podiatry;  Laterality: Right;  LMA LOCAL DIABETIC   block  . CALCANEAL OSTEOTOMY Right 12/07/2018   Procedure: PARTIAL CALCANECTOMY RIGHT;  Surgeon: Albertine Patricia, DPM;  Location: [facility];  Service: Podiatry;  Laterality: Right;  . CARDIAC CATHETERIZATION  2005   stents x2  . COLONOSCOPY    . FINGER ARTHROPLASTY Right 12/05/2012   Procedure: RIGHT INDEX FINGER IMPLANT ARTHROPLASTY;  Surgeon: Cammie Sickle., MD;  Location: [facility];  Service:  Orthopedics;  Laterality: Right;  . HERNIA REPAIR  1990   rt ing   . QUADRICEPS TENDON REPAIR Left 02/11/2017   Procedure: REPAIR QUADRICEP TENDON;  Surgeon: Leim Fabry, MD;  Location: [facility];  Service: Orthopedics;  Laterality: Left;  . SHOULDER ACROMIOPLASTY  1967   [place]   . SHOULDER ARTHROSCOPY  2012   right  . TONSILLECTOMY      Prior to Admission medications   Medication Sig Start Date End Date Taking? Authorizing Provider  aspirin 81 MG tablet Take 81 mg by mouth daily.    [provider]  atorvastatin (LIPITOR) 40 MG tablet Take 80 mg by mouth daily.     [provider]  glimepiride (AMARYL) 4 MG tablet Take 4 mg by mouth 2 (two) times daily. 08/05/19   [provider]  magnesium oxide (MAG-OX) 400 MG tablet Take by mouth.    [provider]  metFORMIN (GLUCOPHAGE) 500 MG tablet Take 1,000 mg by mouth 2 (two) times daily with a meal.    [provider]  Multiple Vitamins-Minerals (MULTIVITAMIN WITH MINERALS) tablet Take 0.5 tablets by mouth 2 (two) times daily.     [provider]  Omega-3 Fatty Acids (FISH OIL) 1200 MG CAPS Take 1,200 mg by mouth 2 (two) times daily.    [provider]  omeprazole (PRILOSEC) 20 MG capsule Take 20 mg by mouth daily.     [provider]  pioglitazone (ACTOS) 30 MG tablet Take 30 mg by mouth daily. 08/03/19   [provider]  ramipril (ALTACE) 10 MG capsule Take 10 mg by mouth every evening.    [provider]  sitaGLIPtin (JANUVIA) 100 MG tablet Take 100 mg by mouth daily.    [provider]  tadalafil (CIALIS) 5 MG tablet Take 1 tablet (5 mg total) by mouth daily as needed for erectile dysfunction. 10/03/19 10/02/20  Hollice Espy, MD  tamsulosin (FLOMAX) 0.4 MG CAPS capsule Take 1 capsule by mouth 2 (two) times daily. 08/06/19   [provider]    Family History  Problem Relation Age of Onset  . Coronary artery disease  Mother   . Diabetes Mother   . Alzheimer's disease Mother   . Heart disease Father   . Diabetes Father      Social History   Tobacco Use  . Smoking status: Former Smoker    Quit date: 1978    Years since quitting: 43.7  . Smokeless tobacco: Never Used  Substance Use Topics  . Alcohol use: Yes    Comment: occ  . Drug use: No    Allergies as of 12/05/2019 - Review Complete 10/30/2019  Allergen Reaction Noted  . Haemophilus influenzae Anaphylaxis and Shortness Of Breath 12/01/2012  . Hemophilus b polysaccharide vaccine Anaphylaxis and Shortness Of Breath 12/01/2012  . Influenza vac split [flu virus vaccine] Shortness Of Breath and Other (See Comments) 12/01/2012  . Compazine [prochlorperazine edisylate] Other (See Comments) 12/01/2012  . Hydroxyzine  03/24/2006  . Prochlorperazine Other (See Comments) 03/24/2006    Review of Systems:    All systems reviewed and negative except where noted in HPI.   Physical Exam:  There were no vitals taken for this visit. No LMP for male patient. Psych:  Alert and cooperative. Normal mood and affect. General:   Alert,  Well-developed, well-nourished, pleasant and cooperative in NAD Head:  Normocephalic and atraumatic. Eyes:  Sclera clear, no icterus.   Conjunctiva pink. Ears:  Normal auditory acuity. Lungs:  Respirations even and unlabored.  Clear throughout to auscultation.   No wheezes, crackles, or rhonchi. No acute distress. Heart:  Regular rate and rhythm; no murmurs, clicks, rubs, or gallops. Abdomen:  Normal bowel sounds.  No bruits.  Soft, non-tender and non-distended without masses, hepatosplenomegaly or hernias noted.  No guarding or rebound tenderness.    Neurologic:  Alert and oriented x3;  grossly normal neurologically. Psych:  Alert and cooperative. Normal mood and affect.  Imaging Studies: No results found.  Assessment and Plan:   Dustin Crane is a 79 y.o. y/o male has been referred for colon cancer screening .   Prior history of colon polyps last colonoscopy was 3 years back.  He seems to have good functional status and very likely life expectancy over 10 years.  Hence he would benefit from a screening colonoscopy.  He is undergoing an exercise test in a few weeks.  I would suggest we schedule a colonoscopy after that.    I have discussed alternative options, risks & benefits,  which include, but are not limited to, bleeding, infection, perforation,respiratory complication & drug reaction.  The patient agrees with this plan & written consent will be obtained.     Follow up in as needed  Dr [name] MD,MRCP(U.K)

## 2019-12-11 ENCOUNTER — Ambulatory Visit: Payer: Medicare Other | Admitting: Gastroenterology

## 2020-01-15 ENCOUNTER — Other Ambulatory Visit: Admission: RE | Admit: 2020-01-15 | Payer: Medicare Other | Source: Ambulatory Visit

## 2020-01-17 ENCOUNTER — Encounter: Admission: RE | Payer: Self-pay | Source: Home / Self Care

## 2020-01-17 ENCOUNTER — Ambulatory Visit: Admission: RE | Admit: 2020-01-17 | Payer: Medicare Other | Source: Home / Self Care | Admitting: Gastroenterology

## 2020-01-17 SURGERY — COLONOSCOPY WITH PROPOFOL
Anesthesia: General

## 2020-03-11 ENCOUNTER — Other Ambulatory Visit: Payer: Self-pay

## 2020-03-11 ENCOUNTER — Telehealth: Payer: Self-pay | Admitting: Gastroenterology

## 2020-03-11 ENCOUNTER — Telehealth: Payer: Self-pay

## 2020-03-11 DIAGNOSIS — Z1211 Encounter for screening for malignant neoplasm of colon: Secondary | ICD-10-CM

## 2020-03-11 NOTE — Telephone Encounter (Signed)
Called patient wife back and got patient scheduled for 04/01/2020. Informed her COVID test would be on 03/28/2020. She verbalized understanding. Sent new instructions to mychart she states she already have the prep at home

## 2020-03-11 NOTE — Telephone Encounter (Signed)
Patients wife states pt is now ready to reschedule procedure with Dr. Tobi Bastos. Pt canceled for 11.11.21 but had ov 9.29.2021.

## 2020-03-11 NOTE — Telephone Encounter (Signed)
Patients wife call has been returned in regards to rescheduling her husband's colonoscopy due to an appt he has on the COVID Test date.  Colonoscopy has been rescheduled to Thursday 04/03/20 with Dr. Tobi Bastos.  Pt's wife has been advised of new COVID test date Tuesday 04/01/20.  Pts wife has verbalized understanding.  [facility] in Endoscopy has been notified of date change.  Referral updated. New instructions have been mailed.  Thanks,  [place], [place]

## 2020-03-12 ENCOUNTER — Encounter: Payer: Self-pay | Admitting: Urology

## 2020-03-12 ENCOUNTER — Ambulatory Visit (INDEPENDENT_AMBULATORY_CARE_PROVIDER_SITE_OTHER): Payer: Medicare Other | Admitting: Urology

## 2020-03-12 ENCOUNTER — Other Ambulatory Visit: Payer: Self-pay

## 2020-03-12 VITALS — BP 126/69 | HR 85

## 2020-03-12 DIAGNOSIS — N5203 Combined arterial insufficiency and corporo-venous occlusive erectile dysfunction: Secondary | ICD-10-CM | POA: Diagnosis not present

## 2020-03-12 DIAGNOSIS — N5314 Retrograde ejaculation: Secondary | ICD-10-CM | POA: Diagnosis not present

## 2020-03-12 DIAGNOSIS — N138 Other obstructive and reflux uropathy: Secondary | ICD-10-CM

## 2020-03-12 DIAGNOSIS — N401 Enlarged prostate with lower urinary tract symptoms: Secondary | ICD-10-CM | POA: Diagnosis not present

## 2020-03-12 NOTE — Progress Notes (Signed)
03/12/2020 11:23 AM   Dustin Crane 08/25/1940 [id number]  Referring provider: Idelle Crouch, MD [place] [place],  [place] [address]  HPI: 80 year old male with BPH and erectile dysfunction who returns today to discuss possible UroLift procedure.  He has moderately controlled urinary symptoms/IPSS 17/35 on Flomax twice daily.  He does not like the side effects of Flomax including retrograde ejaculation.  He is interested in pursuing UroLift procedure.  He more recently underwent evaluation with TRUS and cystoscopy.  This indicated an 89 cc prostate.  Cystoscopy showed a slightly elevated bladder neck without a discrete median lobe.  Prostamegaly was appreciated.  He is moderately trabeculated.  He is also questions today about the procedure.  He is concerned about sexual side effects.  He hopes that stopping Flomax allow him to ejaculate again.  He also was prescribed tadalafil for erectile dysfunction but has not yet started to take this medication.  He like to hold off until he has a procedure to see how his symptoms improve after stopping Flomax.  No other change in his past medical history.  He takes aspirin for history of CAD.   PMH: Past Medical History:  Diagnosis Date  . Arthritis   . Coronary artery disease   . Diabetes mellitus without complication ([place])   . GERD (gastroesophageal reflux disease)   . Hyperlipemia   . Hypertension   . TIA (transient ischemic attack) 10/2018  . Wears glasses   . Wears hearing aid    both ears    Surgical History: Past Surgical History:  Procedure Laterality Date  . ACHILLES TENDON SURGERY Right 12/07/2018   Procedure: ACHILLES TENDON REPAIR SECONDARY;  Surgeon: Albertine Patricia, DPM;  Location: [facility];  Service: Podiatry;  Laterality: Right;  LMA LOCAL DIABETIC   block  . CALCANEAL OSTEOTOMY Right 12/07/2018   Procedure: PARTIAL CALCANECTOMY RIGHT;  Surgeon: Albertine Patricia, DPM;   Location: [facility];  Service: Podiatry;  Laterality: Right;  . CARDIAC CATHETERIZATION  2005   stents x2  . COLONOSCOPY    . FINGER ARTHROPLASTY Right 12/05/2012   Procedure: RIGHT INDEX FINGER IMPLANT ARTHROPLASTY;  Surgeon: Cammie Sickle., MD;  Location: [facility];  Service: Orthopedics;  Laterality: Right;  . HERNIA REPAIR  1990   rt ing   . QUADRICEPS TENDON REPAIR Left 02/11/2017   Procedure: REPAIR QUADRICEP TENDON;  Surgeon: Leim Fabry, MD;  Location: [facility];  Service: Orthopedics;  Laterality: Left;  . SHOULDER ACROMIOPLASTY  1967   [place]   . SHOULDER ARTHROSCOPY  2012   right  . TONSILLECTOMY      Home Medications:  Allergies as of 03/12/2020      Reactions   Haemophilus Influenzae Anaphylaxis, Shortness Of Breath   Rash-severe n/v/d Rash-severe n/v/d Rash-severe n/v/d Rash-severe n/v/d Rash-severe n/v/d Rash-severe n/v/d Rash-severe n/v/d Rash-severe n/v/d Rash-severe n/v/d   Hemophilus B Polysaccharide Vaccine Anaphylaxis, Shortness Of Breath   Rash-severe n/v/d Rash-severe n/v/d   Influenza Vac Split [influenza Virus Vaccine] Shortness Of Breath, Other (See Comments)   Rash-severe n/v/d   Compazine [prochlorperazine Edisylate] Other (See Comments)   Muscle tightness   Hydroxyzine    Other reaction(s): Other (See Comments), Unknown   Prochlorperazine Other (See Comments)   Other reaction(s): Other (See Comments) Muscle tightness Muscle tightness Muscle tightness Muscle tightness  numbness Muscle tightness Muscle tightness Muscle tightness Muscle tightness  numbness      Medication List  Accurate as of March 12, 2020 11:23 AM. If you have any questions, ask your nurse or doctor.        aspirin 81 MG tablet Take 81 mg by mouth daily.   atorvastatin 40 MG tablet Commonly known as: LIPITOR Take 80 mg by mouth daily.   Fish Oil 1200 MG Caps Take 1,200 mg by mouth 2 (two) times daily.    glimepiride 4 MG tablet Commonly known as: AMARYL Take 4 mg by mouth 2 (two) times daily.   magnesium oxide 400 MG tablet Commonly known as: MAG-OX Take by mouth.   metFORMIN 500 MG tablet Commonly known as: GLUCOPHAGE Take 1,000 mg by mouth 2 (two) times daily with a meal.   multivitamin with minerals tablet Take 0.5 tablets by mouth 2 (two) times daily.   omeprazole 20 MG capsule Commonly known as: PRILOSEC Take 20 mg by mouth daily.   pioglitazone 30 MG tablet Commonly known as: ACTOS Take 30 mg by mouth daily.   ramipril 10 MG capsule Commonly known as: ALTACE Take 10 mg by mouth every evening.   sitaGLIPtin 100 MG tablet Commonly known as: JANUVIA Take 100 mg by mouth daily.   tadalafil 5 MG tablet Commonly known as: CIALIS Take 1 tablet (5 mg total) by mouth daily as needed for erectile dysfunction.   tamsulosin 0.4 MG Caps capsule Commonly known as: FLOMAX Take 1 capsule by mouth 2 (two) times daily.   valACYclovir 1000 MG tablet Commonly known as: VALTREX Take 1,000 mg by mouth 2 (two) times daily.       Allergies:  Allergies  Allergen Reactions  . Haemophilus Influenzae Anaphylaxis and Shortness Of Breath    Rash-severe n/v/d Rash-severe n/v/d Rash-severe n/v/d  Rash-severe n/v/d Rash-severe n/v/d Rash-severe n/v/d Rash-severe n/v/d Rash-severe n/v/d Rash-severe n/v/d   . Hemophilus B Polysaccharide Vaccine Anaphylaxis and Shortness Of Breath    Rash-severe n/v/d Rash-severe n/v/d   . Influenza Vac Split [Influenza Virus Vaccine] Shortness Of Breath and Other (See Comments)    Rash-severe n/v/d  . Compazine [Prochlorperazine Edisylate] Other (See Comments)    Muscle tightness  . Hydroxyzine     Other reaction(s): Other (See Comments), Unknown  . Prochlorperazine Other (See Comments)    Other reaction(s): Other (See Comments) Muscle tightness Muscle tightness Muscle tightness Muscle tightness  numbness Muscle tightness Muscle  tightness Muscle tightness Muscle tightness  numbness     Family History: Family History  Problem Relation Age of Onset  . Coronary artery disease Mother   . Diabetes Mother   . Alzheimer's disease Mother   . Heart disease Father   . Diabetes Father     Social History:  reports that he quit smoking about 44 years ago. He has never used smokeless tobacco. He reports current alcohol use. He reports that he does not use drugs.   Physical Exam: BP 126/69   Pulse 85   Constitutional:  Alert and oriented, No acute distress. HEENT: [place] AT, moist mucus membranes.  Trachea midline, no masses. Cardiovascular: No clubbing, cyanosis, or edema. Respiratory: Normal respiratory effort, no increased work of breathing. Skin: No rashes, bruises or suspicious lesions. Neurologic: Grossly intact, no focal deficits, moving all 4 extremities. Psychiatric: Normal mood and affect.  Assessment & Plan:    1. BPH with urinary obstruction Lengthy discussion today of a UroLift procedure which is his desired intervention based on previous discussion.  We discussed the procedure itself at length including risk of bleeding, infection, damage to surrounding structures,  clip migration, failure of the device to achieve desired goals, need for revision amongst others.  All questions were answered.  We will plan to try to do this under sedation only in the operating room.  It is okay for him to continue aspirin.  We will plan to stop his Flomax after about 2 weeks postop.  He will follow-up with me 4 weeks after the procedure.  2. Retrograde ejaculation Reassess once Flomax is stopped  3. Combined arterial insufficiency and corporo-venous occlusive erectile dysfunction Tadalafil as prescribed   Vanna Scotland, MD  [facility] [address], [address] [place], [place] [address] [phone]

## 2020-03-18 ENCOUNTER — Other Ambulatory Visit: Payer: Self-pay | Admitting: Radiology

## 2020-03-18 DIAGNOSIS — N138 Other obstructive and reflux uropathy: Secondary | ICD-10-CM

## 2020-04-01 ENCOUNTER — Other Ambulatory Visit
Admission: RE | Admit: 2020-04-01 | Discharge: 2020-04-01 | Disposition: A | Discharge status: Home / Self Care | Payer: Medicare Other | Source: Ambulatory Visit | Attending: Gastroenterology | Admitting: Gastroenterology

## 2020-04-01 ENCOUNTER — Other Ambulatory Visit: Payer: Self-pay | Admitting: Internal Medicine

## 2020-04-01 ENCOUNTER — Other Ambulatory Visit: Payer: Self-pay

## 2020-04-01 DIAGNOSIS — Z01812 Encounter for preprocedural laboratory examination: Secondary | ICD-10-CM | POA: Insufficient documentation

## 2020-04-01 DIAGNOSIS — Z20822 Contact with and (suspected) exposure to covid-19: Secondary | ICD-10-CM | POA: Diagnosis not present

## 2020-04-01 DIAGNOSIS — R270 Ataxia, unspecified: Secondary | ICD-10-CM

## 2020-04-01 DIAGNOSIS — R42 Dizziness and giddiness: Secondary | ICD-10-CM

## 2020-04-01 LAB — SARS CORONAVIRUS 2 (TAT 6-24 HRS): SARS Coronavirus 2: NEGATIVE

## 2020-04-03 ENCOUNTER — Ambulatory Visit: Payer: Medicare Other | Admitting: Anesthesiology

## 2020-04-03 ENCOUNTER — Encounter: Payer: Self-pay | Admitting: Gastroenterology

## 2020-04-03 ENCOUNTER — Encounter
Admission: RE | Disposition: A | Discharge status: Home / Self Care | Payer: Self-pay | Source: Home / Self Care | Attending: Gastroenterology

## 2020-04-03 ENCOUNTER — Ambulatory Visit
Admission: RE | Admit: 2020-04-03 | Discharge: 2020-04-03 | Disposition: A | Discharge status: Home / Self Care | Payer: Medicare Other | Attending: Gastroenterology | Admitting: Gastroenterology

## 2020-04-03 ENCOUNTER — Other Ambulatory Visit: Payer: Self-pay

## 2020-04-03 DIAGNOSIS — D124 Benign neoplasm of descending colon: Secondary | ICD-10-CM | POA: Insufficient documentation

## 2020-04-03 DIAGNOSIS — K635 Polyp of colon: Secondary | ICD-10-CM

## 2020-04-03 DIAGNOSIS — Z79899 Other long term (current) drug therapy: Secondary | ICD-10-CM | POA: Insufficient documentation

## 2020-04-03 DIAGNOSIS — D122 Benign neoplasm of ascending colon: Secondary | ICD-10-CM | POA: Insufficient documentation

## 2020-04-03 DIAGNOSIS — Z87891 Personal history of nicotine dependence: Secondary | ICD-10-CM | POA: Diagnosis not present

## 2020-04-03 DIAGNOSIS — Z1211 Encounter for screening for malignant neoplasm of colon: Secondary | ICD-10-CM | POA: Insufficient documentation

## 2020-04-03 DIAGNOSIS — Z8673 Personal history of transient ischemic attack (TIA), and cerebral infarction without residual deficits: Secondary | ICD-10-CM | POA: Diagnosis not present

## 2020-04-03 DIAGNOSIS — Z7982 Long term (current) use of aspirin: Secondary | ICD-10-CM | POA: Diagnosis not present

## 2020-04-03 DIAGNOSIS — Z7984 Long term (current) use of oral hypoglycemic drugs: Secondary | ICD-10-CM | POA: Diagnosis not present

## 2020-04-03 HISTORY — PX: COLONOSCOPY WITH PROPOFOL: SHX5780

## 2020-04-03 LAB — GLUCOSE, CAPILLARY: Glucose-Capillary: 127 mg/dL — ABNORMAL HIGH (ref 70–99)

## 2020-04-03 SURGERY — COLONOSCOPY WITH PROPOFOL
Anesthesia: General

## 2020-04-03 MED ORDER — PROPOFOL 10 MG/ML IV BOLUS
INTRAVENOUS | Status: DC | PRN
  Administered 2020-04-03: 70 mg via INTRAVENOUS

## 2020-04-03 MED ORDER — PROPOFOL 500 MG/50ML IV EMUL
INTRAVENOUS | Status: DC | PRN
  Administered 2020-04-03: 100 ug/kg/min via INTRAVENOUS

## 2020-04-03 MED ORDER — SODIUM CHLORIDE 0.9 % IV SOLN: INTRAVENOUS | Status: DC

## 2020-04-03 NOTE — Anesthesia Preprocedure Evaluation (Addendum)
Anesthesia Evaluation  Patient identified by MRN, date of birth, ID band Patient awake    Reviewed: Allergy & Precautions, NPO status , Patient's Chart, lab work & pertinent test results  History of Anesthesia Complications Negative for: history of anesthetic complications  Airway Mallampati: II  TM Distance: >3 FB Neck ROM: Limited    Dental  (+) Partial Lower, Partial Upper   Pulmonary former smoker,    Pulmonary exam normal        Cardiovascular hypertension, + CAD and + Peripheral Vascular Disease  Normal cardiovascular exam  ECG 04/19/18:  Normal sinus rhythm with sinus arrhythmia Normal ECG When compared with ECG of 30-Apr-2016 11:38, No significant change was found   Neuro/Psych TIAnegative psych ROS   GI/Hepatic Neg liver ROS, GERD  ,  Endo/Other  diabetes, Type 2  Renal/GU negative Renal ROS  negative genitourinary   Musculoskeletal  (+) Arthritis ,   Abdominal   Peds negative pediatric ROS (+)  Hematology  (+) anemia ,   Anesthesia Other Findings Vascular surgery note 11/27/18:  Assessment/Plan 1. Bilateral carotid artery stenosis Recommend:  Given the patient's asymptomatic subcritical stenosis no further invasive testing or surgery at this time.  Duplex ultrasound shows <40% stenosis bilaterally.  The patient is cleared from a vascular standpoint for his foot surgery with Dr. Elvina Mattes.  Continue antiplatelet therapy as prescribed Continue management of CAD, HTN and Hyperlipidemia Healthy heart diet,  encouraged exercise at least 4 times per week Follow up in 6 months with duplex ultrasound and physical exam  - VAS US CAROTID; Future  2. Coronary artery disease of native artery of native heart with stable angina pectoris (HCC) Continue cardiac and antihypertensive medications as already ordered and reviewed, no changes at this time.  Continue statin as ordered and reviewed, no changes  at this time  Nitrates PRN for chest pain   3. Benign essential hypertension Continue antihypertensive medications as already ordered, these medications have been reviewed and there are no changes at this time.   4. Diabetes mellitus type 2 with peripheral artery disease ([place]) Continue hypoglycemic medications as already ordered, these medications have been reviewed and there are no changes at this time.  Hgb A1C to be monitored as already arranged by primary service   5. Gastroesophageal reflux disease without esophagitis Continue PPI as already ordered, this medication has been reviewed and there are no changes at this time.  Avoidence of caffeine and alcohol  Moderate elevation of the head of the bed   6. Mixed hyperlipidemia Continue statin as ordered and reviewed, no changes at this time  Dustin Pilar, MD  11/27/2018 12:02 PM    Cardiology note 10/12/18:  Plan  -Proceed to surgery and/or invasive procedure without restriction to pre or post operative and/or procedural care. The patient is at lowest risk possible for cardiovascular complications with surgical intervention and/or invasive procedure. Currently has no evidence active and/or significant angina and/or congestive heart failure. The patient may discontinue aspirin 4 days prior to procedure and restart at a safe period thereafter -Continue risk factor modification with medication management and other therapy listed above for coronary artery atherosclerosis which appears clinically stable at this time. We have discussed the variability in clinical presentation of cardiovascular disease and for the patient to communicate any new symptoms and or changes if they occur. -Continue to use moderate to high intensive cholesterol therapy for further future risk reduction in cardiovascular disease and complication. The patient currently understands the goals, risks, and benefits  of lipid treatment. We have discussed  the potential side effects profile of these medications and or symptoms. They will watch for any new symptoms. -There has been a discussion of the current guidelines for hypertension control. We will continue current medical regimen for hypertension control which will also help in risk factor modification of cardiovascular disease. The patient understands and agrees with the current plan. We will be watching for possible future side effects of these medications. Additional home blood pressure monitoring is recommended if able. -Continue aggressive medical management of diabetes following the ABCs for prevention of cardiovascular disease and complications. The goals set forth include a goal HbA1c of less than 7, moderate to high intensity statin use if patient can tolerate, and a goal systolic blood pressure of below 187mm.  -We have had a long discussion about the benefits of physical and occupational rehabilitation. The patient is advised and encouraged to enroll for improvements in quality of life and reduced hospitalization.  No orders of the defined types were placed in this encounter.  Return in about 6 months (around 04/14/2019).  Flossie Dibble, MD   Reproductive/Obstetrics                            Anesthesia Physical  Anesthesia Plan  ASA: III  Anesthesia Plan: General   Post-op Pain Management:    Induction: Intravenous  PONV Risk Score and Plan:   Airway Management Planned: Nasal Cannula  Additional Equipment:   Intra-op Plan:   Post-operative Plan:   Informed Consent: I have reviewed the patients History and Physical, chart, labs and discussed the procedure including the risks, benefits and alternatives for the proposed anesthesia with the patient or authorized representative who has indicated his/her understanding and acceptance.     Dental advisory given  Plan Discussed with: CRNA and Surgeon  Anesthesia Plan Comments:          Anesthesia Quick Evaluation

## 2020-04-03 NOTE — Op Note (Signed)
[facility] Patient Name: Dustin Crane Procedure Date: 04/03/2020 9:00 AM MRN: [id number] Account #: [id number] Date of Birth: May 21, 1940 Admit Type: Outpatient Age: 80 Room: [facility] ENDO ROOM 3 Gender: Male Note Status: Finalized Procedure:             Colonoscopy Indications:           Screening for colorectal malignant neoplasm Providers:             [name] MD, MD Referring MD:          Leonie Douglas. Doy Hutching, MD (Referring MD) Medicines:             Monitored Anesthesia Care Complications:         No immediate complications. Procedure:             Pre-Anesthesia Assessment:                        - Prior to the procedure, a History and Physical was                         performed, and patient medications, allergies and                         sensitivities were reviewed. The patient's tolerance                         of previous anesthesia was reviewed.                        - The risks and benefits of the procedure and the                         sedation options and risks were discussed with the                         patient. All questions were answered and informed                         consent was obtained.                        - ASA Grade Assessment: II - A patient with mild                         systemic disease.                        After obtaining informed consent, the colonoscope was                         passed under direct vision. Throughout the procedure,                         the patient's blood pressure, pulse, and oxygen                         saturations were monitored continuously. The                         Colonoscope was introduced through the anus  and                         advanced to the the cecum, identified by the                         appendiceal orifice. The colonoscopy was performed                         with ease. The patient tolerated the procedure well.                         The quality  of the bowel preparation was excellent. Findings:      The perianal and digital rectal examinations were normal.      Two sessile polyps were found in the ascending colon and proximal       ascending colon. The polyps were 7 to 10 mm in size. These polyps were       removed with a cold snare. Resection and retrieval were complete.      Four sessile polyps were found in the descending colon. The polyps were       6 to 9 mm in size. These polyps were removed with a cold snare.       Resection and retrieval were complete. To prevent bleeding after the       polypectomy, one hemostatic clip was successfully placed. There was no       bleeding at the end of the procedure.      The exam was otherwise without abnormality on direct and retroflexion       views. Impression:            - Two 7 to 10 mm polyps in the ascending colon and in                         the proximal ascending colon, removed with a cold                         snare. Resected and retrieved.                        - Four 6 to 9 mm polyps in the descending colon,                         removed with a cold snare. Resected and retrieved.                         Clip was placed.                        - The examination was otherwise normal on direct and                         retroflexion views. Recommendation:        - Discharge patient to home (with escort).                        - Resume previous diet.                        -  Continue present medications.                        - Await pathology results.                        - Repeat colonoscopy for surveillance based on                         pathology results. Procedure Code(s):     --- Professional ---                        [phone], Colonoscopy, flexible; with removal of                         tumor(s), polyp(s), or other lesion(s) by snare                         technique Diagnosis Code(s):     --- Professional ---                        Z12.11, Encounter for  screening for malignant neoplasm                         of colon                        K63.5, Polyp of colon CPT copyright 2019 American Medical Association. All rights reserved. The codes documented in this report are preliminary and upon coder review may  be revised to meet current compliance requirements. [name], MD [name] MD, MD 04/03/2020 9:42:37 AM This report has been signed electronically. Number of Addenda: 0 Note Initiated On: 04/03/2020 9:00 AM Scope Withdrawal Time: 0 hours 23 minutes 37 seconds  Total Procedure Duration: 0 hours 26 minutes 43 seconds  Estimated Blood Loss:  Estimated blood loss: none.      [facility]

## 2020-04-03 NOTE — H&P (Signed)
Dustin Bellows, MD [address], [place], [place], [place], [address] [address], [place], [place], [place], [address] Phone: [phone]  Fax: [phone]  Primary Care Physician:  Idelle Crouch, MD   Pre-Procedure History & Physical: HPI:  Dustin Crane is a 80 y.o. male is here for an colonoscopy.   Past Medical History:  Diagnosis Date  . Arthritis   . Coronary artery disease   . Diabetes mellitus without complication ([place])   . GERD (gastroesophageal reflux disease)   . Hyperlipemia   . Hypertension   . TIA (transient ischemic attack) 10/2018  . Wears glasses   . Wears hearing aid    both ears    Past Surgical History:  Procedure Laterality Date  . ACHILLES TENDON SURGERY Right 12/07/2018   Procedure: ACHILLES TENDON REPAIR SECONDARY;  Surgeon: Albertine Patricia, DPM;  Location: [place] [facility];  Service: Podiatry;  Laterality: Right;  LMA LOCAL DIABETIC   block  . CALCANEAL OSTEOTOMY Right 12/07/2018   Procedure: PARTIAL CALCANECTOMY RIGHT;  Surgeon: Albertine Patricia, DPM;  Location: [facility];  Service: Podiatry;  Laterality: Right;  . CARDIAC CATHETERIZATION  2005   stents x2  . COLONOSCOPY    . FINGER ARTHROPLASTY Right 12/05/2012   Procedure: RIGHT INDEX FINGER IMPLANT ARTHROPLASTY;  Surgeon: Cammie Sickle., MD;  Location: [facility];  Service: Orthopedics;  Laterality: Right;  . HERNIA REPAIR  1990   rt ing   . QUADRICEPS TENDON REPAIR Left 02/11/2017   Procedure: REPAIR QUADRICEP TENDON;  Surgeon: Leim Fabry, MD;  Location: [facility];  Service: Orthopedics;  Laterality: Left;  . SHOULDER ACROMIOPLASTY  1967   [place]   . SHOULDER ARTHROSCOPY  2012   right  . TONSILLECTOMY      Prior to Admission medications   Medication Sig Start Date End Date Taking? Authorizing Provider  aspirin 81 MG tablet Take 81 mg by mouth daily.   Yes [provider]  atorvastatin (LIPITOR) 40 MG tablet Take 80  mg by mouth daily.    Yes [provider]  glimepiride (AMARYL) 4 MG tablet Take 4 mg by mouth 2 (two) times daily. 08/05/19  Yes [provider]  magnesium oxide (MAG-OX) 400 MG tablet Take by mouth.   Yes [provider]  metFORMIN (GLUCOPHAGE) 500 MG tablet Take 1,000 mg by mouth 2 (two) times daily with a meal.   Yes [provider]  Multiple Vitamins-Minerals (MULTIVITAMIN WITH MINERALS) tablet Take 0.5 tablets by mouth 2 (two) times daily.    Yes [provider]  Omega-3 Fatty Acids (FISH OIL) 1200 MG CAPS Take 1,200 mg by mouth 2 (two) times daily.   Yes [provider]  omeprazole (PRILOSEC) 20 MG capsule Take 20 mg by mouth daily.    Yes [provider]  pioglitazone (ACTOS) 30 MG tablet Take 30 mg by mouth daily. 08/03/19  Yes [provider]  ramipril (ALTACE) 10 MG capsule Take 10 mg by mouth every evening.   Yes [provider]  sitaGLIPtin (JANUVIA) 100 MG tablet Take 100 mg by mouth daily.   Yes [provider]  tamsulosin (FLOMAX) 0.4 MG CAPS capsule Take 1 capsule by mouth 2 (two) times daily. 08/06/19  Yes [provider]  valACYclovir (VALTREX) 1000 MG tablet Take 1,000 mg by mouth 2 (two) times daily. 11/26/19  Yes [provider]  tadalafil (CIALIS) 5 MG tablet Take 1 tablet (5  mg total) by mouth daily as needed for erectile dysfunction. 10/03/19 10/02/20  Hollice Espy, MD    Allergies as of 03/11/2020 - Review Complete 12/05/2019  Allergen Reaction Noted  . Haemophilus influenzae Anaphylaxis and Shortness Of Breath 12/01/2012  . Hemophilus b polysaccharide vaccine Anaphylaxis and Shortness Of Breath 12/01/2012  . Influenza vac split [influenza virus vaccine] Shortness Of Breath and Other (See Comments) 12/01/2012  . Compazine [prochlorperazine edisylate] Other (See Comments) 12/01/2012  . Hydroxyzine  03/24/2006  . Prochlorperazine Other (See Comments) 03/24/2006     Family History  Problem Relation Age of Onset  . Coronary artery disease Mother   . Diabetes Mother   . Alzheimer's disease Mother   . Heart disease Father   . Diabetes Father     Social History   Socioeconomic History  . Marital status: Married    Spouse name: Not on file  . Number of children: Not on file  . Years of education: Not on file  . Highest education level: Not on file  Occupational History  . Not on file  Tobacco Use  . Smoking status: Former Smoker    Quit date: 1978    Years since quitting: 44.1  . Smokeless tobacco: Never Used  Vaping Use  . Vaping Use: Never used  Substance and Sexual Activity  . Alcohol use: Yes    Comment: occ  . Drug use: No  . Sexual activity: Not on file  Other Topics Concern  . Not on file  Social History Narrative   DIABETIC   Social Determinants of Health   Financial Resource Strain: Not on file  Food Insecurity: Not on file  Transportation Needs: Not on file  Physical Activity: Not on file  Stress: Not on file  Social Connections: Not on file  Intimate Partner Violence: Not on file    Review of Systems: See HPI, otherwise negative ROS  Physical Exam: BP 133/81   Pulse 98   Temp (!) 97.1 F (36.2 C) (Temporal)   Resp 17   Ht 5\' 8"  (1.727 m)   Wt 81.2 kg   SpO2 99%   BMI 27.22 kg/m  General:   Alert,  pleasant and cooperative in NAD Head:  Normocephalic and atraumatic. Neck:  Supple; no masses or thyromegaly. Lungs:  Clear throughout to auscultation, normal respiratory effort.    Heart:  +S1, +S2, Regular rate and rhythm, No edema. Abdomen:  Soft, nontender and nondistended. Normal bowel sounds, without guarding, and without rebound.   Neurologic:  Alert and  oriented x4;  grossly normal neurologically.  Impression/Plan: Dustin Crane is here for an colonoscopy to be performed for Screening colonoscopy average risk   Risks, benefits, limitations, and alternatives regarding  colonoscopy have been  reviewed with the patient.  Questions have been answered.  All parties agreeable.   Dustin Bellows, MD  04/03/2020, 8:54 AM

## 2020-04-03 NOTE — Transfer of Care (Signed)
Immediate Anesthesia Transfer of Care Note  Patient: Dustin Crane  Procedure(s) Performed: COLONOSCOPY WITH PROPOFOL (N/A )  Patient Location: PACU  Anesthesia Type:General  Level of Consciousness: awake, alert  and oriented  Airway & Oxygen Therapy: Patient Spontanous Breathing  Post-op Assessment: Report given to RN and Post -op Vital signs reviewed and stable  Post vital signs: Reviewed and stable  Last Vitals:  Vitals Value Taken Time  BP    Temp    Pulse 78 04/03/20 0944  Resp 12 04/03/20 0944  SpO2 94 % 04/03/20 0944  Vitals shown include unvalidated device data.  Last Pain:  Vitals:   04/03/20 0808  TempSrc: Temporal  PainSc: 0-No pain         Complications: No complications documented.

## 2020-04-04 ENCOUNTER — Encounter: Payer: Self-pay | Admitting: Gastroenterology

## 2020-04-04 LAB — SURGICAL PATHOLOGY

## 2020-04-08 NOTE — Anesthesia Postprocedure Evaluation (Signed)
Anesthesia Post Note  Patient: Dustin Crane  Procedure(s) Performed: COLONOSCOPY WITH PROPOFOL (N/A )  Patient location during evaluation: Endoscopy Anesthesia Type: General Level of consciousness: awake and alert and oriented Pain management: pain level controlled Vital Signs Assessment: post-procedure vital signs reviewed and stable Respiratory status: spontaneous breathing Cardiovascular status: blood pressure returned to baseline Anesthetic complications: no   No complications documented.   Last Vitals:  Vitals:   04/03/20 0944 04/03/20 1014  BP: 92/61 (!) 149/74  Pulse:    Resp:    Temp: (!) 35.9 C   SpO2:      Last Pain:  Vitals:   04/03/20 1014  TempSrc:   PainSc: 0-No pain                 Mikeisha Lemonds

## 2020-04-13 ENCOUNTER — Encounter: Payer: Self-pay | Admitting: Gastroenterology

## 2020-04-15 ENCOUNTER — Encounter: Payer: Self-pay | Admitting: Ophthalmology

## 2020-04-18 ENCOUNTER — Other Ambulatory Visit: Payer: Self-pay

## 2020-04-18 ENCOUNTER — Other Ambulatory Visit
Admission: RE | Admit: 2020-04-18 | Discharge: 2020-04-18 | Disposition: A | Discharge status: Home / Self Care | Payer: Medicare Other | Source: Ambulatory Visit | Attending: Ophthalmology | Admitting: Ophthalmology

## 2020-04-18 DIAGNOSIS — Z01812 Encounter for preprocedural laboratory examination: Secondary | ICD-10-CM | POA: Diagnosis present

## 2020-04-18 DIAGNOSIS — Z20822 Contact with and (suspected) exposure to covid-19: Secondary | ICD-10-CM | POA: Insufficient documentation

## 2020-04-18 LAB — SARS CORONAVIRUS 2 (TAT 6-24 HRS): SARS Coronavirus 2: NEGATIVE

## 2020-04-21 NOTE — Anesthesia Preprocedure Evaluation (Addendum)
Anesthesia Evaluation  Patient identified by MRN, date of birth, ID band Patient awake    Reviewed: NPO status   Airway Mallampati: II  TM Distance: >3 FB Neck ROM: full    Dental  (+) Upper Dentures, Lower Dentures   Pulmonary neg pulmonary ROS, former smoker,    Pulmonary exam normal        Cardiovascular Exercise Tolerance: Good hypertension, + CAD, + Past MI (2004) and + Cardiac Stents (LAD stent x2 2004)  Normal cardiovascular exam     Neuro/Psych negative neurological ROS  negative psych ROS   GI/Hepatic Neg liver ROS, GERD  Controlled,  Endo/Other  diabetes  Renal/GU negative Renal ROS  negative genitourinary   Musculoskeletal  (+) Arthritis ,   Abdominal   Peds  Hematology negative hematology ROS (+)   Anesthesia Other Findings pcp: 03/2020: sparks; cards: 01/2020: Nehemiah Massed;  stress: 12/2019: Normal treadmill EKG without evidence of ischemia or a arrhythmia Small perfusion abnormality of mild intensity in the inferior myocardial  Region;  echo: 12/2019: ef=45%; BASAL-TO-APICAL INFEROLATERAL WALL WITH SUBTLE HYPOKINESIS ;  Covid: NEG.  Reproductive/Obstetrics                            Anesthesia Physical Anesthesia Plan  ASA: II  Anesthesia Plan: MAC   Post-op Pain Management:    Induction:   PONV Risk Score and Plan: 1 and TIVA  Airway Management Planned:   Additional Equipment:   Intra-op Plan:   Post-operative Plan:   Informed Consent: I have reviewed the patients History and Physical, chart, labs and discussed the procedure including the risks, benefits and alternatives for the proposed anesthesia with the patient or authorized representative who has indicated his/her understanding and acceptance.       Plan Discussed with: CRNA  Anesthesia Plan Comments:         Anesthesia Quick Evaluation

## 2020-04-21 NOTE — Discharge Instructions (Signed)

## 2020-04-22 ENCOUNTER — Encounter: Payer: Self-pay | Admitting: Ophthalmology

## 2020-04-22 ENCOUNTER — Other Ambulatory Visit: Payer: Self-pay

## 2020-04-22 ENCOUNTER — Encounter
Admission: RE | Disposition: A | Discharge status: Home / Self Care | Payer: Self-pay | Source: Home / Self Care | Attending: Ophthalmology

## 2020-04-22 ENCOUNTER — Ambulatory Visit: Payer: Medicare Other | Admitting: Anesthesiology

## 2020-04-22 ENCOUNTER — Ambulatory Visit
Admission: RE | Admit: 2020-04-22 | Discharge: 2020-04-22 | Disposition: A | Discharge status: Home / Self Care | Payer: Medicare Other | Attending: Ophthalmology | Admitting: Ophthalmology

## 2020-04-22 DIAGNOSIS — E1136 Type 2 diabetes mellitus with diabetic cataract: Secondary | ICD-10-CM | POA: Diagnosis not present

## 2020-04-22 DIAGNOSIS — Z7982 Long term (current) use of aspirin: Secondary | ICD-10-CM | POA: Insufficient documentation

## 2020-04-22 DIAGNOSIS — Z8673 Personal history of transient ischemic attack (TIA), and cerebral infarction without residual deficits: Secondary | ICD-10-CM | POA: Insufficient documentation

## 2020-04-22 DIAGNOSIS — Z87891 Personal history of nicotine dependence: Secondary | ICD-10-CM | POA: Insufficient documentation

## 2020-04-22 DIAGNOSIS — Z7984 Long term (current) use of oral hypoglycemic drugs: Secondary | ICD-10-CM | POA: Insufficient documentation

## 2020-04-22 DIAGNOSIS — Z79899 Other long term (current) drug therapy: Secondary | ICD-10-CM | POA: Diagnosis not present

## 2020-04-22 DIAGNOSIS — H2511 Age-related nuclear cataract, right eye: Secondary | ICD-10-CM | POA: Insufficient documentation

## 2020-04-22 DIAGNOSIS — Z955 Presence of coronary angioplasty implant and graft: Secondary | ICD-10-CM | POA: Insufficient documentation

## 2020-04-22 HISTORY — PX: CATARACT EXTRACTION W/PHACO: SHX586

## 2020-04-22 LAB — GLUCOSE, CAPILLARY
Glucose-Capillary: 179 mg/dL — ABNORMAL HIGH (ref 70–99)
Glucose-Capillary: 171 mg/dL — ABNORMAL HIGH (ref 70–99)

## 2020-04-22 SURGERY — PHACOEMULSIFICATION, CATARACT, WITH IOL INSERTION
Anesthesia: Monitor Anesthesia Care | Site: Eye | Laterality: Right

## 2020-04-22 MED ORDER — TETRACAINE HCL 0.5 % OP SOLN
1.0000 [drp] | OPHTHALMIC | Status: DC | PRN
  Administered 2020-04-22 (×3): 1 [drp] via OPHTHALMIC

## 2020-04-22 MED ORDER — LIDOCAINE HCL (PF) 2 % IJ SOLN
INTRAOCULAR | Status: DC | PRN
  Administered 2020-04-22: 2 mL

## 2020-04-22 MED ORDER — BRIMONIDINE TARTRATE-TIMOLOL 0.2-0.5 % OP SOLN
OPHTHALMIC | Status: DC | PRN
  Administered 2020-04-22: 1 [drp] via OPHTHALMIC

## 2020-04-22 MED ORDER — MOXIFLOXACIN HCL 0.5 % OP SOLN
OPHTHALMIC | Status: DC | PRN
  Administered 2020-04-22: 0.2 mL via OPHTHALMIC

## 2020-04-22 MED ORDER — NA CHONDROIT SULF-NA HYALURON 40-17 MG/ML IO SOLN
INTRAOCULAR | Status: DC | PRN
  Administered 2020-04-22: 1 mL via INTRAOCULAR

## 2020-04-22 MED ORDER — LACTATED RINGERS IV SOLN: INTRAVENOUS | Status: DC

## 2020-04-22 MED ORDER — EPINEPHRINE PF 1 MG/ML IJ SOLN
INTRAOCULAR | Status: DC | PRN
  Administered 2020-04-22: 78 mL via OPHTHALMIC

## 2020-04-22 MED ORDER — OXYCODONE HCL 5 MG PO TABS: 5.0000 mg | ORAL_TABLET | Freq: Once | ORAL | Status: DC | PRN

## 2020-04-22 MED ORDER — MIDAZOLAM HCL 2 MG/2ML IJ SOLN
INTRAMUSCULAR | Status: DC | PRN
  Administered 2020-04-22: 2 mg via INTRAVENOUS

## 2020-04-22 MED ORDER — OXYCODONE HCL 5 MG/5ML PO SOLN: 5.0000 mg | Freq: Once | ORAL | Status: DC | PRN

## 2020-04-22 MED ORDER — FENTANYL CITRATE (PF) 100 MCG/2ML IJ SOLN
INTRAMUSCULAR | Status: DC | PRN
  Administered 2020-04-22 (×2): 50 ug via INTRAVENOUS

## 2020-04-22 MED ORDER — ARMC OPHTHALMIC DILATING DROPS
1.0000 "application " | OPHTHALMIC | Status: DC | PRN
  Administered 2020-04-22 (×3): 1 via OPHTHALMIC

## 2020-04-22 MED ORDER — CARBACHOL 0.01 % IO SOLN
INTRAOCULAR | Status: DC | PRN
  Administered 2020-04-22: 0.5 mL via INTRAOCULAR

## 2020-04-22 SURGICAL SUPPLY — 19 items
CANNULA ANT/CHMB 27G (MISCELLANEOUS) ×2 IMPLANT
CANNULA ANT/CHMB 27GA (MISCELLANEOUS) ×4 IMPLANT
GLOVE SURG LX 8.0 MICRO (GLOVE) ×1
GLOVE SURG LX STRL 8.0 MICRO (GLOVE) ×1 IMPLANT
GLOVE SURG TRIUMPH 8.0 PF LTX (GLOVE) ×2 IMPLANT
GOWN STRL REUS W/ TWL LRG LVL3 (GOWN DISPOSABLE) ×2 IMPLANT
GOWN STRL REUS W/TWL LRG LVL3 (GOWN DISPOSABLE) ×4
LENS IOL TECNIS EYHANCE 21.0 (Intraocular Lens) ×1 IMPLANT
MARKER SKIN DUAL TIP RULER LAB (MISCELLANEOUS) ×2 IMPLANT
NDL FILTER BLUNT 18X1 1/2 (NEEDLE) ×1 IMPLANT
NEEDLE FILTER BLUNT 18X 1/2SAF (NEEDLE) ×1
NEEDLE FILTER BLUNT 18X1 1/2 (NEEDLE) ×1 IMPLANT
PACK EYE AFTER SURG (MISCELLANEOUS) ×2 IMPLANT
PACK OPTHALMIC (MISCELLANEOUS) ×2 IMPLANT
PACK PORFILIO (MISCELLANEOUS) ×2 IMPLANT
SYR 3ML LL SCALE MARK (SYRINGE) ×2 IMPLANT
SYR TB 1ML LUER SLIP (SYRINGE) ×2 IMPLANT
WATER STERILE IRR 250ML POUR (IV SOLUTION) ×2 IMPLANT
WIPE NON LINTING 3.25X3.25 (MISCELLANEOUS) ×2 IMPLANT

## 2020-04-22 NOTE — Op Note (Signed)
PREOPERATIVE DIAGNOSIS:  Nuclear sclerotic cataract of the right eye.   POSTOPERATIVE DIAGNOSIS:  Cataract   OPERATIVE PROCEDURE:@   SURGEON:  Birder Robson, MD.   ANESTHESIA:  Anesthesiologist: Fidel Levy, MD CRNA: Jeannene Patella, CRNA  1.      Managed anesthesia care. 2.      0.69ml of Shugarcaine was instilled in the eye following the paracentesis.   COMPLICATIONS:  None.   TECHNIQUE:   Stop and chop   DESCRIPTION OF PROCEDURE:  The patient was examined and consented in the preoperative holding area where the aforementioned topical anesthesia was applied to the right eye and then brought back to the Operating Room where the right eye was prepped and draped in the usual sterile ophthalmic fashion and a lid speculum was placed. A paracentesis was created with the side port blade and the anterior chamber was filled with viscoelastic. A near clear corneal incision was performed with the steel keratome. A continuous curvilinear capsulorrhexis was performed with a cystotome followed by the capsulorrhexis forceps. Hydrodissection and hydrodelineation were carried out with BSS on a blunt cannula. The lens was removed in a stop and chop  technique and the remaining cortical material was removed with the irrigation-aspiration handpiece. The capsular bag was inflated with viscoelastic and the Technis ZCB00  lens was placed in the capsular bag without complication. The remaining viscoelastic was removed from the eye with the irrigation-aspiration handpiece. The wounds were hydrated. The anterior chamber was flushed with BSS and the eye was inflated to physiologic pressure. 0.68ml of Vigamox was placed in the anterior chamber. The wounds were found to be water tight. The eye was dressed with Combigan. The patient was given protective glasses to wear throughout the day and a shield with which to sleep tonight. The patient was also given drops with which to begin a drop regimen today and will  follow-up with me in one day. Implant Name Type Inv. Item Serial No. Manufacturer Lot No. LRB No. Used Action  LENS IOL TECNIS EYHANCE 21.0 - [id number] Intraocular Lens LENS IOL TECNIS EYHANCE 21.0 [id number] JOHNSON   Right 1 Implanted   Procedure(s): CATARACT EXTRACTION PHACO AND INTRAOCULAR LENS PLACEMENT (IOC) RIGHT DIABETIC 3.65 00:41.9 (Right)  Electronically signed: Birder Robson 04/22/2020 7:54 AM

## 2020-04-22 NOTE — Anesthesia Postprocedure Evaluation (Signed)
Anesthesia Post Note  Patient: Dustin Crane  Procedure(s) Performed: CATARACT EXTRACTION PHACO AND INTRAOCULAR LENS PLACEMENT (IOC) RIGHT DIABETIC 3.65 00:41.9 (Right Eye)     Patient location during evaluation: PACU Anesthesia Type: MAC Level of consciousness: awake and alert Pain management: pain level controlled Vital Signs Assessment: post-procedure vital signs reviewed and stable Respiratory status: spontaneous breathing, nonlabored ventilation, respiratory function stable and patient connected to nasal cannula oxygen Cardiovascular status: stable and blood pressure returned to baseline Postop Assessment: no apparent nausea or vomiting Anesthetic complications: no   No complications documented.  Fidel Levy

## 2020-04-22 NOTE — H&P (Signed)
[facility]   Primary Care Physician:  Idelle Crouch, MD Ophthalmologist: Dr. George Ina  Pre-Procedure History & Physical: HPI:  Dustin Crane is a 80 y.o. male here for cataract surgery.   Past Medical History:  Diagnosis Date  . Arthritis   . Coronary artery disease   . Diabetes mellitus without complication ([place])   . GERD (gastroesophageal reflux disease)   . Hyperlipemia   . Hypertension   . TIA (transient ischemic attack) 10/2018  . Wears glasses   . Wears hearing aid    both ears    Past Surgical History:  Procedure Laterality Date  . ACHILLES TENDON SURGERY Right 12/07/2018   Procedure: ACHILLES TENDON REPAIR SECONDARY;  Surgeon: Albertine Patricia, DPM;  Location: [place];  Service: Podiatry;  Laterality: Right;  LMA LOCAL DIABETIC   block  . CALCANEAL OSTEOTOMY Right 12/07/2018   Procedure: PARTIAL CALCANECTOMY RIGHT;  Surgeon: Albertine Patricia, DPM;  Location: [place];  Service: Podiatry;  Laterality: Right;  . CARDIAC CATHETERIZATION  2005   stents x2  . COLONOSCOPY    . COLONOSCOPY WITH PROPOFOL N/A 04/03/2020   Procedure: COLONOSCOPY WITH PROPOFOL;  Surgeon: [name], MD;  Location: [facility] ENDOSCOPY;  Service: Gastroenterology;  Laterality: N/A;  . FINGER ARTHROPLASTY Right 12/05/2012   Procedure: RIGHT INDEX FINGER IMPLANT ARTHROPLASTY;  Surgeon: Cammie Sickle., MD;  Location: [place];  Service: Orthopedics;  Laterality: Right;  . HERNIA REPAIR  1990   rt ing   . QUADRICEPS TENDON REPAIR Left 02/11/2017   Procedure: REPAIR QUADRICEP TENDON;  Surgeon: Leim Fabry, MD;  Location: [facility];  Service: Orthopedics;  Laterality: Left;  . SHOULDER ACROMIOPLASTY  1967   [place]   . SHOULDER ARTHROSCOPY  2012   right  . TONSILLECTOMY      Prior to Admission medications   Medication Sig Start Date End Date Taking? Authorizing Provider  aspirin 81 MG tablet Take 81 mg by mouth daily.   Yes [provider]  atorvastatin (LIPITOR) 40 MG tablet Take 80 mg by mouth daily.    Yes [provider]  glimepiride (AMARYL) 4 MG tablet Take 4 mg by mouth 2 (two) times daily. 08/05/19  Yes [provider]  metFORMIN (GLUCOPHAGE) 500 MG tablet Take 1,000 mg by mouth 2 (two) times daily with a meal.   Yes [provider]  Multiple Vitamins-Minerals (MULTIVITAMIN WITH MINERALS) tablet Take 0.5 tablets by mouth 2 (two) times daily.    Yes [provider]  Omega-3 Fatty Acids (FISH OIL) 1200 MG CAPS Take 1,200 mg by mouth 2 (two) times daily.   Yes [provider]  omeprazole (PRILOSEC) 20 MG capsule Take 20 mg by mouth daily.    Yes [provider]  pioglitazone (ACTOS) 30 MG tablet Take 30 mg by mouth daily. 08/03/19  Yes [provider]  ramipril (ALTACE) 10 MG capsule Take 10 mg by mouth every evening.   Yes [provider]  sitaGLIPtin (JANUVIA) 100 MG tablet Take 100 mg by mouth daily.   Yes [provider]  tadalafil (CIALIS) 5 MG tablet Take 1 tablet (5 mg total) by mouth daily as needed for erectile dysfunction. 10/03/19 10/02/20 Yes Hollice Espy, MD  tamsulosin (FLOMAX) 0.4 MG CAPS capsule Take 1 capsule by mouth 2 (two) times daily. 08/06/19  Yes [provider]  valACYclovir (VALTREX) 1000 MG tablet Take 1,000 mg by mouth 2 (two) times daily. 11/26/19  Yes [provider]  magnesium oxide (MAG-OX) 400 MG tablet Take by mouth. Patient not taking: Reported on 04/15/2020    [provider]    Allergies as of 02/20/2020 - Review Complete 12/05/2019  Allergen Reaction Noted  . Haemophilus influenzae Anaphylaxis and Shortness Of Breath 12/01/2012  . Hemophilus b polysaccharide vaccine Anaphylaxis and Shortness Of Breath 12/01/2012  . Influenza vac split [influenza virus vaccine] Shortness Of Breath and Other (See Comments) 12/01/2012  . Compazine [prochlorperazine edisylate] Other (See  Comments) 12/01/2012  . Hydroxyzine  03/24/2006  . Prochlorperazine Other (See Comments) 03/24/2006    Family History  Problem Relation Age of Onset  . Coronary artery disease Mother   . Diabetes Mother   . Alzheimer's disease Mother   . Heart disease Father   . Diabetes Father     Social History   Socioeconomic History  . Marital status: Married    Spouse name: Not on file  . Number of children: Not on file  . Years of education: Not on file  . Highest education level: Not on file  Occupational History  . Not on file  Tobacco Use  . Smoking status: Former Smoker    Quit date: 1978    Years since quitting: 44.1  . Smokeless tobacco: Never Used  Vaping Use  . Vaping Use: Never used  Substance and Sexual Activity  . Alcohol use: Yes    Comment: occ  . Drug use: No  . Sexual activity: Not on file  Other Topics Concern  . Not on file  Social History Narrative   DIABETIC   Social Determinants of Health   Financial Resource Strain: Not on file  Food Insecurity: Not on file  Transportation Needs: Not on file  Physical Activity: Not on file  Stress: Not on file  Social Connections: Not on file  Intimate Partner Violence: Not on file    Review of Systems: See HPI, otherwise negative ROS  Physical Exam: BP (!) 142/65   Pulse 87   Temp 97.6 F (36.4 C)   Wt 81.2 kg   SpO2 97%   BMI 27.22 kg/m  General:   Alert,  pleasant and cooperative in NAD Head:  Normocephalic and atraumatic. Respiratory:  Normal work of breathing.  Impression/Plan: Dustin Crane is here for cataract surgery.  Risks, benefits, limitations, and alternatives regarding cataract surgery have been reviewed with the patient.  Questions have been answered.  All parties agreeable.   Birder Robson, MD  04/22/2020, 7:23 AM

## 2020-04-22 NOTE — Anesthesia Procedure Notes (Signed)
Procedure Name: MAC Date/Time: 04/22/2020 7:31 AM Performed by: Jeannene Patella, CRNA Pre-anesthesia Checklist: Patient identified, Emergency Drugs available, Suction available, Timeout performed and Patient being monitored Patient Re-evaluated:Patient Re-evaluated prior to induction Oxygen Delivery Method: Nasal cannula Placement Confirmation: positive ETCO2

## 2020-04-22 NOTE — Transfer of Care (Signed)
Immediate Anesthesia Transfer of Care Note  Patient: Dustin Crane  Procedure(s) Performed: CATARACT EXTRACTION PHACO AND INTRAOCULAR LENS PLACEMENT (IOC) RIGHT DIABETIC 3.65 00:41.9 (Right Eye)  Patient Location: PACU  Anesthesia Type: MAC  Level of Consciousness: awake, alert  and patient cooperative  Airway and Oxygen Therapy: Patient Spontanous Breathing and Patient connected to supplemental oxygen  Post-op Assessment: Post-op Vital signs reviewed, Patient's Cardiovascular Status Stable, Respiratory Function Stable, Patent Airway and No signs of Nausea or vomiting  Post-op Vital Signs: Reviewed and stable  Complications: No complications documented.

## 2020-04-23 ENCOUNTER — Encounter: Payer: Self-pay | Admitting: Ophthalmology

## 2020-04-30 ENCOUNTER — Encounter: Payer: Self-pay | Admitting: Ophthalmology

## 2020-05-02 ENCOUNTER — Other Ambulatory Visit
Admission: RE | Admit: 2020-05-02 | Discharge: 2020-05-02 | Disposition: A | Discharge status: Home / Self Care | Payer: Medicare Other | Source: Ambulatory Visit | Attending: Urology | Admitting: Urology

## 2020-05-02 ENCOUNTER — Other Ambulatory Visit: Payer: Self-pay

## 2020-05-02 DIAGNOSIS — Z01812 Encounter for preprocedural laboratory examination: Secondary | ICD-10-CM | POA: Diagnosis present

## 2020-05-02 DIAGNOSIS — Z20822 Contact with and (suspected) exposure to covid-19: Secondary | ICD-10-CM | POA: Insufficient documentation

## 2020-05-02 LAB — SARS CORONAVIRUS 2 (TAT 6-24 HRS): SARS Coronavirus 2: NEGATIVE

## 2020-05-05 ENCOUNTER — Other Ambulatory Visit: Payer: Self-pay

## 2020-05-05 DIAGNOSIS — N401 Enlarged prostate with lower urinary tract symptoms: Secondary | ICD-10-CM

## 2020-05-05 DIAGNOSIS — N138 Other obstructive and reflux uropathy: Secondary | ICD-10-CM

## 2020-05-05 NOTE — Discharge Instructions (Signed)

## 2020-05-06 ENCOUNTER — Encounter: Payer: Self-pay | Admitting: Ophthalmology

## 2020-05-06 ENCOUNTER — Ambulatory Visit
Admission: RE | Admit: 2020-05-06 | Discharge: 2020-05-06 | Disposition: A | Discharge status: Home / Self Care | Payer: Medicare Other | Attending: Ophthalmology | Admitting: Ophthalmology

## 2020-05-06 ENCOUNTER — Other Ambulatory Visit: Payer: Self-pay

## 2020-05-06 ENCOUNTER — Ambulatory Visit: Payer: Medicare Other | Admitting: Anesthesiology

## 2020-05-06 ENCOUNTER — Encounter
Admission: RE | Disposition: A | Discharge status: Home / Self Care | Payer: Self-pay | Source: Home / Self Care | Attending: Ophthalmology

## 2020-05-06 DIAGNOSIS — H2512 Age-related nuclear cataract, left eye: Secondary | ICD-10-CM | POA: Diagnosis not present

## 2020-05-06 DIAGNOSIS — Z7982 Long term (current) use of aspirin: Secondary | ICD-10-CM | POA: Insufficient documentation

## 2020-05-06 DIAGNOSIS — Z7984 Long term (current) use of oral hypoglycemic drugs: Secondary | ICD-10-CM | POA: Diagnosis not present

## 2020-05-06 DIAGNOSIS — Z887 Allergy status to serum and vaccine status: Secondary | ICD-10-CM | POA: Diagnosis not present

## 2020-05-06 DIAGNOSIS — Z87891 Personal history of nicotine dependence: Secondary | ICD-10-CM | POA: Insufficient documentation

## 2020-05-06 DIAGNOSIS — Z888 Allergy status to other drugs, medicaments and biological substances status: Secondary | ICD-10-CM | POA: Diagnosis not present

## 2020-05-06 DIAGNOSIS — Z8673 Personal history of transient ischemic attack (TIA), and cerebral infarction without residual deficits: Secondary | ICD-10-CM | POA: Insufficient documentation

## 2020-05-06 DIAGNOSIS — Z955 Presence of coronary angioplasty implant and graft: Secondary | ICD-10-CM | POA: Diagnosis not present

## 2020-05-06 DIAGNOSIS — E1136 Type 2 diabetes mellitus with diabetic cataract: Secondary | ICD-10-CM | POA: Diagnosis not present

## 2020-05-06 HISTORY — PX: CATARACT EXTRACTION W/PHACO: SHX586

## 2020-05-06 LAB — GLUCOSE, CAPILLARY
Glucose-Capillary: 171 mg/dL — ABNORMAL HIGH (ref 70–99)
Glucose-Capillary: 147 mg/dL — ABNORMAL HIGH (ref 70–99)

## 2020-05-06 SURGERY — PHACOEMULSIFICATION, CATARACT, WITH IOL INSERTION
Anesthesia: Monitor Anesthesia Care | Site: Eye | Laterality: Left

## 2020-05-06 MED ORDER — LACTATED RINGERS IV SOLN: INTRAVENOUS | Status: DC

## 2020-05-06 MED ORDER — EPINEPHRINE PF 1 MG/ML IJ SOLN
INTRAOCULAR | Status: DC | PRN
  Administered 2020-05-06: 73 mL via OPHTHALMIC

## 2020-05-06 MED ORDER — NA CHONDROIT SULF-NA HYALURON 40-17 MG/ML IO SOLN
INTRAOCULAR | Status: DC | PRN
  Administered 2020-05-06: 1 mL via INTRAOCULAR

## 2020-05-06 MED ORDER — MIDAZOLAM HCL 2 MG/2ML IJ SOLN
INTRAMUSCULAR | Status: DC | PRN
  Administered 2020-05-06: 2 mg via INTRAVENOUS

## 2020-05-06 MED ORDER — TETRACAINE HCL 0.5 % OP SOLN
1.0000 [drp] | OPHTHALMIC | Status: DC | PRN
  Administered 2020-05-06 (×3): 1 [drp] via OPHTHALMIC

## 2020-05-06 MED ORDER — ACETAMINOPHEN 325 MG PO TABS: 325.0000 mg | ORAL_TABLET | Freq: Once | ORAL | Status: DC

## 2020-05-06 MED ORDER — BRIMONIDINE TARTRATE-TIMOLOL 0.2-0.5 % OP SOLN
OPHTHALMIC | Status: DC | PRN
  Administered 2020-05-06: 1 [drp] via OPHTHALMIC

## 2020-05-06 MED ORDER — ACETAMINOPHEN 160 MG/5ML PO SOLN: 325.0000 mg | Freq: Once | ORAL | Status: DC

## 2020-05-06 MED ORDER — FENTANYL CITRATE (PF) 100 MCG/2ML IJ SOLN
INTRAMUSCULAR | Status: DC | PRN
  Administered 2020-05-06 (×2): 50 ug via INTRAVENOUS

## 2020-05-06 MED ORDER — MOXIFLOXACIN HCL 0.5 % OP SOLN
OPHTHALMIC | Status: DC | PRN
  Administered 2020-05-06: 0.2 mL via OPHTHALMIC

## 2020-05-06 MED ORDER — ARMC OPHTHALMIC DILATING DROPS
1.0000 "application " | OPHTHALMIC | Status: DC | PRN
  Administered 2020-05-06 (×3): 1 via OPHTHALMIC

## 2020-05-06 MED ORDER — LIDOCAINE HCL (PF) 2 % IJ SOLN
INTRAOCULAR | Status: DC | PRN
  Administered 2020-05-06: 1 mL

## 2020-05-06 SURGICAL SUPPLY — 22 items
CANNULA ANT/CHMB 27G (MISCELLANEOUS) ×2 IMPLANT
CANNULA ANT/CHMB 27GA (MISCELLANEOUS) ×4 IMPLANT
GLOVE SURG LX 8.0 MICRO (GLOVE) ×2
GLOVE SURG LX STRL 8.0 MICRO (GLOVE) ×1 IMPLANT
GLOVE SURG TRIUMPH 8.0 PF LTX (GLOVE) ×2 IMPLANT
GOWN STRL REUS W/ TWL LRG LVL3 (GOWN DISPOSABLE) ×2 IMPLANT
GOWN STRL REUS W/TWL LRG LVL3 (GOWN DISPOSABLE) ×4
LENS IOL TECNIS EYHANCE 21.0 (Intraocular Lens) ×1 IMPLANT
MARKER SKIN DUAL TIP RULER LAB (MISCELLANEOUS) ×2 IMPLANT
NDL FILTER BLUNT 18X1 1/2 (NEEDLE) ×1 IMPLANT
NEEDLE FILTER BLUNT 18X 1/2SAF (NEEDLE) ×1
NEEDLE FILTER BLUNT 18X1 1/2 (NEEDLE) ×1 IMPLANT
PACK EYE AFTER SURG (MISCELLANEOUS) ×2 IMPLANT
PACK OPTHALMIC (MISCELLANEOUS) ×2 IMPLANT
PACK PORFILIO (MISCELLANEOUS) ×2 IMPLANT
RING MALYGIN (MISCELLANEOUS) ×1 IMPLANT
SUT ETHILON 10-0 CS-B-6CS-B-6 (SUTURE)
SUTURE EHLN 10-0 CS-B-6CS-B-6 (SUTURE) IMPLANT
SYR 3ML LL SCALE MARK (SYRINGE) ×2 IMPLANT
SYR TB 1ML LUER SLIP (SYRINGE) ×2 IMPLANT
WATER STERILE IRR 250ML POUR (IV SOLUTION) ×2 IMPLANT
WIPE NON LINTING 3.25X3.25 (MISCELLANEOUS) ×2 IMPLANT

## 2020-05-06 NOTE — Anesthesia Preprocedure Evaluation (Signed)
Anesthesia Evaluation  Patient identified by MRN, date of birth, ID band Patient awake    Reviewed: NPO status   Airway Mallampati: II  TM Distance: >3 FB Neck ROM: full    Dental  (+) Upper Dentures, Lower Dentures   Pulmonary neg pulmonary ROS, former smoker,    Pulmonary exam normal        Cardiovascular Exercise Tolerance: Good hypertension, + CAD, + Past MI (2004) and + Cardiac Stents (LAD stent x2 2004)  Normal cardiovascular exam     Neuro/Psych negative neurological ROS  negative psych ROS   GI/Hepatic Neg liver ROS, GERD  Controlled,  Endo/Other  diabetes  Renal/GU negative Renal ROS  negative genitourinary   Musculoskeletal  (+) Arthritis ,   Abdominal   Peds  Hematology negative hematology ROS (+)   Anesthesia Other Findings pcp: 03/2020: sparks; cards: 01/2020: Nehemiah Massed;  stress: 12/2019: Normal treadmill EKG without evidence of ischemia or a arrhythmia Small perfusion abnormality of mild intensity in the inferior myocardial  Region;  echo: 12/2019: ef=45%; BASAL-TO-APICAL INFEROLATERAL WALL WITH SUBTLE HYPOKINESIS ;  Covid: NEG.  Reproductive/Obstetrics                             Anesthesia Physical  Anesthesia Plan  ASA: II  Anesthesia Plan: MAC   Post-op Pain Management:    Induction:   PONV Risk Score and Plan: 1 and TIVA  Airway Management Planned:   Additional Equipment:   Intra-op Plan:   Post-operative Plan:   Informed Consent: I have reviewed the patients History and Physical, chart, labs and discussed the procedure including the risks, benefits and alternatives for the proposed anesthesia with the patient or authorized representative who has indicated his/her understanding and acceptance.       Plan Discussed with: CRNA  Anesthesia Plan Comments:         Anesthesia Quick Evaluation

## 2020-05-06 NOTE — H&P (Signed)
[facility]   Primary Care Physician:  Idelle Crouch, MD Ophthalmologist: Dr. George Ina  Pre-Procedure History & Physical: HPI:  Dustin Crane is a 80 y.o. male here for cataract surgery.   Past Medical History:  Diagnosis Date  . Arthritis   . Coronary artery disease   . Diabetes mellitus without complication ([place])   . GERD (gastroesophageal reflux disease)   . Hyperlipemia   . Hypertension   . TIA (transient ischemic attack) 10/2018  . Wears glasses   . Wears hearing aid    both ears    Past Surgical History:  Procedure Laterality Date  . ACHILLES TENDON SURGERY Right 12/07/2018   Procedure: ACHILLES TENDON REPAIR SECONDARY;  Surgeon: Albertine Patricia, DPM;  Location: [place];  Service: Podiatry;  Laterality: Right;  LMA LOCAL DIABETIC   block  . CALCANEAL OSTEOTOMY Right 12/07/2018   Procedure: PARTIAL CALCANECTOMY RIGHT;  Surgeon: Albertine Patricia, DPM;  Location: [place];  Service: Podiatry;  Laterality: Right;  . CARDIAC CATHETERIZATION  2005   stents x2  . CATARACT EXTRACTION W/PHACO Right 04/22/2020   Procedure: CATARACT EXTRACTION PHACO AND INTRAOCULAR LENS PLACEMENT (IOC) RIGHT DIABETIC 3.65 00:41.9;  Surgeon: Birder Robson, MD;  Location: [place];  Service: Ophthalmology;  Laterality: Right;  . COLONOSCOPY    . COLONOSCOPY WITH PROPOFOL N/A 04/03/2020   Procedure: COLONOSCOPY WITH PROPOFOL;  Surgeon: [name], MD;  Location: [facility] ENDOSCOPY;  Service: Gastroenterology;  Laterality: N/A;  . FINGER ARTHROPLASTY Right 12/05/2012   Procedure: RIGHT INDEX FINGER IMPLANT ARTHROPLASTY;  Surgeon: Cammie Sickle., MD;  Location: [place];  Service: Orthopedics;  Laterality: Right;  . HERNIA REPAIR  1990   rt ing   . QUADRICEPS TENDON REPAIR Left 02/11/2017   Procedure: REPAIR QUADRICEP TENDON;  Surgeon: Leim Fabry, MD;  Location: [facility];  Service: Orthopedics;  Laterality: Left;  . SHOULDER  ACROMIOPLASTY  1967   [place]   . SHOULDER ARTHROSCOPY  2012   right  . TONSILLECTOMY      Prior to Admission medications   Medication Sig Start Date End Date Taking? Authorizing Provider  aspirin 81 MG tablet Take 81 mg by mouth daily.   Yes [provider]  atorvastatin (LIPITOR) 40 MG tablet Take 40 mg by mouth in the morning and at bedtime.   Yes [provider]  glimepiride (AMARYL) 4 MG tablet Take 4 mg by mouth 2 (two) times daily. 08/05/19  Yes [provider]  magnesium oxide (MAG-OX) 400 MG tablet Take by mouth.   Yes [provider]  metFORMIN (GLUCOPHAGE) 500 MG tablet Take 1,000 mg by mouth 2 (two) times daily with a meal.   Yes [provider]  Multiple Vitamins-Minerals (MULTIVITAMIN WITH MINERALS) tablet Take 1 tablet by mouth daily.   Yes [provider]  Omega-3 Fatty Acids (FISH OIL) 1200 MG CAPS Take 1,200 mg by mouth 3 (three) times a week.   Yes [provider]  omeprazole (PRILOSEC) 20 MG capsule Take 20 mg by mouth 2 (two) times daily before a meal.   Yes [provider]  pioglitazone (ACTOS) 30 MG tablet Take 30 mg by mouth daily. 08/03/19  Yes [provider]  ramipril (ALTACE) 10 MG capsule Take 10 mg by mouth every evening.   Yes [provider]  sitaGLIPtin (JANUVIA) 100 MG tablet Take 100 mg by mouth daily.   Yes [provider]  tamsulosin (FLOMAX) 0.4  MG CAPS capsule Take 0.4 mg by mouth 2 (two) times daily. 08/06/19  Yes [provider]  tadalafil (CIALIS) 5 MG tablet Take 1 tablet (5 mg total) by mouth daily as needed for erectile dysfunction. Patient not taking: Reported on 05/01/2020 10/03/19 10/02/20  Hollice Espy, MD  valACYclovir (VALTREX) 1000 MG tablet Take 1,000 mg by mouth 2 (two) times daily. Patient not taking: No sig reported 11/26/19   [provider]    Allergies as of 02/20/2020 - Review Complete 12/05/2019  Allergen  Reaction Noted  . Haemophilus influenzae Anaphylaxis and Shortness Of Breath 12/01/2012  . Hemophilus b polysaccharide vaccine Anaphylaxis and Shortness Of Breath 12/01/2012  . Influenza vac split [influenza virus vaccine] Shortness Of Breath and Other (See Comments) 12/01/2012  . Compazine [prochlorperazine edisylate] Other (See Comments) 12/01/2012  . Hydroxyzine  03/24/2006  . Prochlorperazine Other (See Comments) 03/24/2006    Family History  Problem Relation Age of Onset  . Coronary artery disease Mother   . Diabetes Mother   . Alzheimer's disease Mother   . Heart disease Father   . Diabetes Father     Social History   Socioeconomic History  . Marital status: Married    Spouse name: Not on file  . Number of children: Not on file  . Years of education: Not on file  . Highest education level: Not on file  Occupational History  . Not on file  Tobacco Use  . Smoking status: Former Smoker    Quit date: 1978    Years since quitting: 44.1  . Smokeless tobacco: Never Used  Vaping Use  . Vaping Use: Never used  Substance and Sexual Activity  . Alcohol use: Yes    Comment: occ  . Drug use: No  . Sexual activity: Not on file  Other Topics Concern  . Not on file  Social History Narrative   DIABETIC   Social Determinants of Health   Financial Resource Strain: Not on file  Food Insecurity: Not on file  Transportation Needs: Not on file  Physical Activity: Not on file  Stress: Not on file  Social Connections: Not on file  Intimate Partner Violence: Not on file    Review of Systems: See HPI, otherwise negative ROS  Physical Exam: Pulse 94   Temp (!) 96.9 F (36.1 C) (Temporal)   Resp 16   Ht 5\' 8"  (1.727 m)   Wt 79.4 kg   SpO2 96%   BMI 26.61 kg/m  General:   Alert,  pleasant and cooperative in NAD Head:  Normocephalic and atraumatic. Respiratory:  Normal work of breathing.  Impression/Plan: Dustin Crane is here for cataract surgery.  Risks,  benefits, limitations, and alternatives regarding cataract surgery have been reviewed with the patient.  Questions have been answered.  All parties agreeable.   Birder Robson, MD  05/06/2020, 7:24 AM

## 2020-05-06 NOTE — Op Note (Signed)
PREOPERATIVE DIAGNOSIS:  Nuclear sclerotic cataract of the left eye.   POSTOPERATIVE DIAGNOSIS:  Nuclear sclerotic cataract of the left eye.   OPERATIVE PROCEDURE:@   SURGEON:  Birder Robson, MD.   ANESTHESIA:  Anesthesiologist: Ronelle Nigh, MD CRNA: Silvana Newness, CRNA  1.      Managed anesthesia care. 2.     0.56ml of Shugarcaine was instilled following the paracentesis   COMPLICATIONS:  None.   TECHNIQUE:   Stop and chop   DESCRIPTION OF PROCEDURE:  The patient was examined and consented in the preoperative holding area where the aforementioned topical anesthesia was applied to the left eye and then brought back to the Operating Room where the left eye was prepped and draped in the usual sterile ophthalmic fashion and a lid speculum was placed. A paracentesis was created with the side port blade and the anterior chamber was filled with viscoelastic. A near clear corneal incision was performed with the steel keratome. A continuous curvilinear capsulorrhexis was performed with a cystotome followed by the capsulorrhexis forceps. Hydrodissection and hydrodelineation were carried out with BSS on a blunt cannula. The lens was removed in a stop and chop  technique and the remaining cortical material was removed with the irrigation-aspiration handpiece. The capsular bag was inflated with viscoelastic and the Technis DIB00 lens was placed in the capsular bag without complication. The remaining viscoelastic was removed from the eye with the irrigation-aspiration handpiece. The wounds were hydrated. The anterior chamber was flushed with BSS and the eye was inflated to physiologic pressure. 0.67ml Vigamox was placed in the anterior chamber. The wounds were found to be water tight. Dexycu was placed. The eye was dressed with Combigan. The patient was given protective glasses to wear throughout the day and a shield with which to sleep tonight. The patient was also given drops with which to begin a drop  regimen today and will follow-up with me in one day. Implant Name Type Inv. Item Serial No. Manufacturer Lot No. LRB No. Used Action  LENS IOL TECNIS EYHANCE 21.0 - [id number] Intraocular Lens LENS IOL TECNIS EYHANCE 21.0 [id number] JOHNSON   Left 1 Implanted    Procedure(s) with comments: CATARACT EXTRACTION PHACO AND INTRAOCULAR LENS PLACEMENT (IOC) LEFT DIABETIC MYALUGIN (Left) - Dexycu 9%  LOT [id number]  EXP 10/05/2020 given @ 0757 inj OS  8.30 1:12.4  Electronically signed: Birder Robson 05/06/2020 8:00 AM

## 2020-05-06 NOTE — Anesthesia Procedure Notes (Signed)
Procedure Name: MAC Date/Time: 05/06/2020 7:36 AM Performed by: Silvana Newness, CRNA Pre-anesthesia Checklist: Patient identified, Emergency Drugs available, Suction available, Patient being monitored and Timeout performed Patient Re-evaluated:Patient Re-evaluated prior to induction Oxygen Delivery Method: Nasal cannula Placement Confirmation: positive ETCO2

## 2020-05-06 NOTE — Anesthesia Postprocedure Evaluation (Signed)
Anesthesia Post Note  Patient: Dustin Crane  Procedure(s) Performed: CATARACT EXTRACTION PHACO AND INTRAOCULAR LENS PLACEMENT (IOC) LEFT DIABETIC MYALUGIN (Left Eye)     Patient location during evaluation: PACU Anesthesia Type: MAC Level of consciousness: awake and alert and oriented Pain management: satisfactory to patient Vital Signs Assessment: post-procedure vital signs reviewed and stable Respiratory status: spontaneous breathing, nonlabored ventilation and respiratory function stable Cardiovascular status: blood pressure returned to baseline and stable Postop Assessment: Adequate PO intake and No signs of nausea or vomiting Anesthetic complications: no   No complications documented.  Raliegh Ip

## 2020-05-06 NOTE — Transfer of Care (Signed)
Immediate Anesthesia Transfer of Care Note  Patient: Dustin Crane  Procedure(s) Performed: CATARACT EXTRACTION PHACO AND INTRAOCULAR LENS PLACEMENT (IOC) LEFT DIABETIC MYALUGIN (Left Eye)  Patient Location: PACU  Anesthesia Type: MAC  Level of Consciousness: awake, alert  and patient cooperative  Airway and Oxygen Therapy: Patient Spontanous Breathing and Patient connected to supplemental oxygen  Post-op Assessment: Post-op Vital signs reviewed, Patient's Cardiovascular Status Stable, Respiratory Function Stable, Patent Airway and No signs of Nausea or vomiting  Post-op Vital Signs: Reviewed and stable  Complications: No complications documented.

## 2020-05-07 ENCOUNTER — Encounter: Payer: Self-pay | Admitting: Ophthalmology

## 2020-05-09 ENCOUNTER — Other Ambulatory Visit: Payer: Medicare Other

## 2020-05-09 ENCOUNTER — Other Ambulatory Visit: Payer: Self-pay

## 2020-05-09 DIAGNOSIS — N138 Other obstructive and reflux uropathy: Secondary | ICD-10-CM

## 2020-05-09 DIAGNOSIS — N401 Enlarged prostate with lower urinary tract symptoms: Secondary | ICD-10-CM

## 2020-05-09 LAB — MICROSCOPIC EXAMINATION

## 2020-05-09 LAB — URINALYSIS, COMPLETE
Bilirubin, UA: NEGATIVE
Glucose, UA: NEGATIVE
Leukocytes,UA: NEGATIVE
Nitrite, UA: NEGATIVE
RBC, UA: NEGATIVE
Specific Gravity, UA: >1.03 — ABNORMAL HIGH (ref 1.005–1.030)
Urobilinogen, Ur: 0.2 mg/dL (ref 0.2–1.0)
pH, UA: 5 (ref 5.0–7.5)

## 2020-05-12 ENCOUNTER — Encounter
Admission: RE | Admit: 2020-05-12 | Discharge: 2020-05-12 | Disposition: A | Discharge status: Home / Self Care | Payer: Medicare Other | Source: Ambulatory Visit | Attending: Urology | Admitting: Urology

## 2020-05-12 ENCOUNTER — Other Ambulatory Visit: Payer: Self-pay

## 2020-05-12 DIAGNOSIS — Z01818 Encounter for other preprocedural examination: Secondary | ICD-10-CM | POA: Insufficient documentation

## 2020-05-12 HISTORY — DX: Peripheral vascular disease, unspecified: I73.9

## 2020-05-12 HISTORY — DX: Malignant (primary) neoplasm, unspecified: C80.1

## 2020-05-12 NOTE — Patient Instructions (Addendum)
INSTRUCTIONS FOR SURGERY     Your surgery is scheduled for:   Monday, MARCH 14TH     To find out your arrival time for the day of surgery,          please call [phone] between 1 pm and 3 pm on :  Friday, MARCH 11TH     When you arrive for surgery, report to the [facility].      ONCE FINISHED THERE, GO UP TO THE SECOND FLOOR AND SIGN IN AT THE         SURGERY DESK.       REMEMBER: Instructions that are not followed completely may result in serious medical risk,  up to and including death, or upon the discretion of your surgeon and anesthesiologist,            your surgery may need to be rescheduled.  __X__ 1. Do not eat food after midnight the night before your procedure.                    No gum, candy, lozenger, tic tacs, tums or hard candies.                  ABSOLUTELY NOTHING SOLID IN YOUR MOUTH AFTER MIDNIGHT                    You may drink unlimited clear liquids up to 2 hours before you are scheduled to arrive for surgery.                   Do not drink anything within those 2 hours unless you need to take medicine, then take the                   smallest amount you need.  Clear liquids include:  water, apple juice without pulp,                   any flavor Gatorade, Black coffee, black tea.  Sugar may be added but no dairy/ honey /lemon.                        Broth and jello is not considered a clear liquid.  __x__  2. On the morning of surgery, please brush your teeth with toothpaste and water. You may rinse with                  mouthwash if you wish but DO NOT SWALLOW TOOTHPASTE OR MOUTHWASH  __X___3. NO alcohol for 24 hours before or after surgery.  __x___ 4.  Do NOT smoke or use e-cigarettes for 24 HOURS PRIOR TO SURGERY.                      DO NOT Use any chewable tobacco products for at least 6 hours prior to surgery.  __x___ 5. If you start any new medication after this  appointment and prior to surgery, please                   Bring it with you  on the day of surgery.  ___x__ 6. Notify your doctor if there is any change in your medical condition, such as fever,                   infection, vomitting, diarrhea or any open sores.  __x___ 7.  USE ANTIBACTERIAL SOAP as instructed, the night before surgery and the day of surgery.                   Once you have washed with this soap, do NOT use any of the following: Powders, perfumes                    or lotions. Please do not wear make up, hairpins, clips or nail polish. You MAY  wear deodorant.                   Men may shave their face and neck.                     DO NOT wear ANY jewelry on the day of surgery. If there are rings that are too tight to                    remove easily, please address this prior to the surgery day. Piercings need to be removed.                                                                     NO METAL ON YOUR BODY.                    Do NOT bring any valuables.  If you came to Pre-Admit testing then you will not need license,                     insurance card or credit card.  If you will be staying overnight, please either leave your things in                     the car or have your family be responsible for these items.                     [facility] IS NOT RESPONSIBLE FOR BELONGINGS OR VALUABLES.  ___X__ 8. DO NOT wear contact lenses on surgery day.  You may not have dentures,                     Hearing aides, contacts or glasses in the operating room. These items can be                    Placed in the Recovery Room to receive immediately after surgery.  __x___ 9. IF YOU ARE SCHEDULED TO GO HOME ON THE SAME DAY, YOU MUST                   Have someone to drive you home and to stay with you  for the first 24 hours.                    Have an arrangement prior to arriving on surgery  day.  ___x__ 10. Take the following medications on the morning of surgery with a sip of  water:                              1. PRILOSEC // OMEPRAZOLE(take an extra dose the night before surgery)                     2. ATORVASTATIN                     3. FLOMAX                     4.                  __X__  12. STOP ALL ASPIRIN PRODUCTS AS OF TODAY, MARCH 7TH                       THIS INCLUDES BC POWDERS / GOODIES POWDER  __x___ 13. STOP Anti-inflammatories as of TODAY, MARCH 7TH                      This includes IBUPROFEN / MOTRIN / ADVIL / ALEVE/ NAPROXYN                    YOU MAY TAKE TYLENOL ANY TIME PRIOR TO SURGERY.  __X___ 18.  Stop supplements until after surgery.                       THIS INCLUDES:  MAGNESIUM // MULTIVITAMINS // FISH OIL  __X____16.  Stop Metformin 2 full days prior to surgery. Stop AFTER EVENING DOSE ON 05/16/20.                  DO NOT TAKE GLIMIPERIDE // ACTOS // JANUVIA ON DAY OF SURGERY                     Do NOT take any diabetes medications on surgery day.  ______17.  Continue to take the following medications but do not take on the morning of surgery:   ____X__18.  Wear clean and comfortable clothing to the hospital.  CONTINUE TO TAKE YOUR EVENING MEDICATIONS AS USUAL.

## 2020-05-12 NOTE — Pre-Procedure Instructions (Signed)
Patient is scheduled for a brain scan and carotid ultrasound on 05/13/20.  He states that this was ordered since he has been complaining of dizziness and vertigo with position changes. He had similar studies done approximately a year ago and stated that he needed a follow up test completed.

## 2020-05-13 ENCOUNTER — Ambulatory Visit
Admission: RE | Admit: 2020-05-13 | Discharge: 2020-05-13 | Disposition: A | Discharge status: Home / Self Care | Payer: Medicare Other | Source: Ambulatory Visit | Attending: Internal Medicine | Admitting: Internal Medicine

## 2020-05-13 DIAGNOSIS — R42 Dizziness and giddiness: Secondary | ICD-10-CM | POA: Insufficient documentation

## 2020-05-13 DIAGNOSIS — R270 Ataxia, unspecified: Secondary | ICD-10-CM

## 2020-05-13 LAB — CULTURE, URINE COMPREHENSIVE

## 2020-05-15 ENCOUNTER — Other Ambulatory Visit
Admission: RE | Admit: 2020-05-15 | Discharge: 2020-05-15 | Disposition: A | Discharge status: Home / Self Care | Payer: Medicare Other | Source: Ambulatory Visit | Attending: Urology | Admitting: Urology

## 2020-05-15 ENCOUNTER — Encounter: Payer: Self-pay | Admitting: Urology

## 2020-05-15 ENCOUNTER — Other Ambulatory Visit: Payer: Self-pay

## 2020-05-15 DIAGNOSIS — Z01812 Encounter for preprocedural laboratory examination: Secondary | ICD-10-CM | POA: Insufficient documentation

## 2020-05-15 DIAGNOSIS — Z20822 Contact with and (suspected) exposure to covid-19: Secondary | ICD-10-CM | POA: Diagnosis not present

## 2020-05-15 LAB — SARS CORONAVIRUS 2 (TAT 6-24 HRS): SARS Coronavirus 2: NEGATIVE

## 2020-05-19 ENCOUNTER — Ambulatory Visit: Payer: Medicare Other | Admitting: Certified Registered Nurse Anesthetist

## 2020-05-19 ENCOUNTER — Encounter
Admission: RE | Disposition: A | Discharge status: Home / Self Care | Payer: Self-pay | Source: Home / Self Care | Attending: Urology

## 2020-05-19 ENCOUNTER — Encounter: Payer: Self-pay | Admitting: Urology

## 2020-05-19 ENCOUNTER — Ambulatory Visit
Admission: RE | Admit: 2020-05-19 | Discharge: 2020-05-19 | Disposition: A | Discharge status: Home / Self Care | Payer: Medicare Other | Attending: Urology | Admitting: Urology

## 2020-05-19 ENCOUNTER — Other Ambulatory Visit: Payer: Self-pay

## 2020-05-19 DIAGNOSIS — Z8673 Personal history of transient ischemic attack (TIA), and cerebral infarction without residual deficits: Secondary | ICD-10-CM | POA: Diagnosis not present

## 2020-05-19 DIAGNOSIS — Z87891 Personal history of nicotine dependence: Secondary | ICD-10-CM | POA: Insufficient documentation

## 2020-05-19 DIAGNOSIS — Z7984 Long term (current) use of oral hypoglycemic drugs: Secondary | ICD-10-CM | POA: Diagnosis not present

## 2020-05-19 DIAGNOSIS — N5203 Combined arterial insufficiency and corporo-venous occlusive erectile dysfunction: Secondary | ICD-10-CM | POA: Diagnosis not present

## 2020-05-19 DIAGNOSIS — N401 Enlarged prostate with lower urinary tract symptoms: Secondary | ICD-10-CM | POA: Insufficient documentation

## 2020-05-19 DIAGNOSIS — N5314 Retrograde ejaculation: Secondary | ICD-10-CM | POA: Diagnosis not present

## 2020-05-19 DIAGNOSIS — Z79899 Other long term (current) drug therapy: Secondary | ICD-10-CM | POA: Insufficient documentation

## 2020-05-19 DIAGNOSIS — Z7982 Long term (current) use of aspirin: Secondary | ICD-10-CM | POA: Insufficient documentation

## 2020-05-19 DIAGNOSIS — N138 Other obstructive and reflux uropathy: Secondary | ICD-10-CM | POA: Diagnosis not present

## 2020-05-19 HISTORY — PX: CYSTOSCOPY WITH INSERTION OF UROLIFT: SHX6678

## 2020-05-19 LAB — GLUCOSE, CAPILLARY
Glucose-Capillary: 237 mg/dL — ABNORMAL HIGH (ref 70–99)
Glucose-Capillary: 268 mg/dL — ABNORMAL HIGH (ref 70–99)

## 2020-05-19 SURGERY — CYSTOSCOPY WITH INSERTION OF UROLIFT
Anesthesia: General

## 2020-05-19 MED ORDER — ACETAMINOPHEN 10 MG/ML IV SOLN
INTRAVENOUS | Status: DC | PRN
  Administered 2020-05-19: 1000 mg via INTRAVENOUS

## 2020-05-19 MED ORDER — LIDOCAINE HCL (CARDIAC) PF 100 MG/5ML IV SOSY
PREFILLED_SYRINGE | INTRAVENOUS | Status: DC | PRN
  Administered 2020-05-19: 80 mg via INTRAVENOUS

## 2020-05-19 MED ORDER — ONDANSETRON HCL 4 MG/2ML IJ SOLN: 4.0000 mg | Freq: Once | INTRAMUSCULAR | Status: DC | PRN

## 2020-05-19 MED ORDER — FENTANYL CITRATE (PF) 100 MCG/2ML IJ SOLN
INTRAMUSCULAR | Status: AC
  Filled 2020-05-19: qty 2

## 2020-05-19 MED ORDER — ONDANSETRON HCL 4 MG/2ML IJ SOLN
INTRAMUSCULAR | Status: DC | PRN
  Administered 2020-05-19: 4 mg via INTRAVENOUS

## 2020-05-19 MED ORDER — PROPOFOL 500 MG/50ML IV EMUL
INTRAVENOUS | Status: DC | PRN
  Administered 2020-05-19: 100 ug/kg/min via INTRAVENOUS

## 2020-05-19 MED ORDER — HYDROCODONE-ACETAMINOPHEN 5-325 MG PO TABS: 1.0000 | ORAL_TABLET | Freq: Four times a day (QID) | ORAL | 0 refills | Status: DC | PRN

## 2020-05-19 MED ORDER — SODIUM CHLORIDE 0.9 % IV SOLN: INTRAVENOUS | Status: DC

## 2020-05-19 MED ORDER — CEFAZOLIN SODIUM-DEXTROSE 2-4 GM/100ML-% IV SOLN
INTRAVENOUS | Status: AC
  Filled 2020-05-19: qty 100

## 2020-05-19 MED ORDER — CHLORHEXIDINE GLUCONATE 0.12 % MT SOLN: 15.0000 mL | Freq: Once | OROMUCOSAL | Status: AC

## 2020-05-19 MED ORDER — FENTANYL CITRATE (PF) 100 MCG/2ML IJ SOLN: 25.0000 ug | INTRAMUSCULAR | Status: DC | PRN

## 2020-05-19 MED ORDER — DEXMEDETOMIDINE (PRECEDEX) IN NS 20 MCG/5ML (4 MCG/ML) IV SYRINGE
PREFILLED_SYRINGE | INTRAVENOUS | Status: AC
  Filled 2020-05-19: qty 5

## 2020-05-19 MED ORDER — PROPOFOL 10 MG/ML IV BOLUS
INTRAVENOUS | Status: AC
  Filled 2020-05-19: qty 20

## 2020-05-19 MED ORDER — CHLORHEXIDINE GLUCONATE 0.12 % MT SOLN
OROMUCOSAL | Status: AC
  Administered 2020-05-19: 15 mL via OROMUCOSAL
  Filled 2020-05-19: qty 15

## 2020-05-19 MED ORDER — PROPOFOL 500 MG/50ML IV EMUL
INTRAVENOUS | Status: AC
  Filled 2020-05-19: qty 50

## 2020-05-19 MED ORDER — CEFAZOLIN SODIUM-DEXTROSE 2-4 GM/100ML-% IV SOLN
2.0000 g | INTRAVENOUS | Status: AC
  Administered 2020-05-19: 2 g via INTRAVENOUS

## 2020-05-19 MED ORDER — PROPOFOL 10 MG/ML IV BOLUS
INTRAVENOUS | Status: DC | PRN
  Administered 2020-05-19: 10 mg via INTRAVENOUS
  Administered 2020-05-19: 30 mg via INTRAVENOUS

## 2020-05-19 MED ORDER — HYDROCODONE-ACETAMINOPHEN 5-325 MG PO TABS
1.0000 | ORAL_TABLET | Freq: Once | ORAL | Status: AC
  Administered 2020-05-19: 1 via ORAL

## 2020-05-19 MED ORDER — ACETAMINOPHEN 10 MG/ML IV SOLN
INTRAVENOUS | Status: AC
  Filled 2020-05-19: qty 100

## 2020-05-19 MED ORDER — HYDROCODONE-ACETAMINOPHEN 5-325 MG PO TABS
ORAL_TABLET | ORAL | Status: AC
  Filled 2020-05-19: qty 1

## 2020-05-19 MED ORDER — ORAL CARE MOUTH RINSE: 15.0000 mL | Freq: Once | OROMUCOSAL | Status: AC

## 2020-05-19 MED ORDER — PHENYLEPHRINE HCL (PRESSORS) 10 MG/ML IV SOLN
INTRAVENOUS | Status: DC | PRN
  Administered 2020-05-19 (×2): 50 ug via INTRAVENOUS

## 2020-05-19 MED ORDER — FENTANYL CITRATE (PF) 100 MCG/2ML IJ SOLN
INTRAMUSCULAR | Status: DC | PRN
  Administered 2020-05-19 (×4): 25 ug via INTRAVENOUS

## 2020-05-19 MED ORDER — DEXMEDETOMIDINE (PRECEDEX) IN NS 20 MCG/5ML (4 MCG/ML) IV SYRINGE
PREFILLED_SYRINGE | INTRAVENOUS | Status: DC | PRN
  Administered 2020-05-19: 4 ug via INTRAVENOUS

## 2020-05-19 SURGICAL SUPPLY — 12 items
BAG DRAIN CYSTO-URO LG1000N (MISCELLANEOUS) ×2 IMPLANT
GLOVE SURG ENC MOIS LTX SZ6.5 (GLOVE) ×2 IMPLANT
GOWN STRL REUS W/ TWL LRG LVL3 (GOWN DISPOSABLE) ×2 IMPLANT
GOWN STRL REUS W/TWL LRG LVL3 (GOWN DISPOSABLE) ×4
KIT TURNOVER CYSTO (KITS) ×2 IMPLANT
MANIFOLD NEPTUNE II (INSTRUMENTS) IMPLANT
PACK CYSTO AR (MISCELLANEOUS) ×2 IMPLANT
SET CYSTO W/LG BORE CLAMP LF (SET/KITS/TRAYS/PACK) ×2 IMPLANT
SURGILUBE 2OZ TUBE FLIPTOP (MISCELLANEOUS) ×2 IMPLANT
SYSTEM UROLIFT (Male Continence) ×12 IMPLANT
WATER STERILE IRR 1000ML POUR (IV SOLUTION) ×2 IMPLANT
WATER STERILE IRR 3000ML UROMA (IV SOLUTION) ×2 IMPLANT

## 2020-05-19 NOTE — Discharge Instructions (Signed)
Urolift Post-Operative Instructions     Patient Expectations   1. Mild blood in your urine for about 1 week.  2. Urinary buring, frequency, and urgency for 10 days.  3. Mild pelvic pain 1-2 weeks.     Return to Activity     1. Drink water post procedure.  2. Take meds as needed.  Tylenol and/or Motrin is most helpful.  You may also by Pyridium/Azo over-the-counter for urinary burning.  3. No lifting or straining 48hrs.  4. Other activity when they feel up to it.  5. You may STOP flomax in 2 weeks post op  AMBULATORY SURGERY  DISCHARGE INSTRUCTIONS   1) The drugs that you were given will stay in your system until tomorrow so for the next 24 hours you should not:  A) Drive an automobile B) Make any legal decisions C) Drink any alcoholic beverage   2) You may resume regular meals tomorrow.  Today it is better to start with liquids and gradually work up to solid foods.  You may eat anything you prefer, but it is better to start with liquids, then soup and crackers, and gradually work up to solid foods.   3) Please notify your doctor immediately if you have any unusual bleeding, trouble breathing, redness and pain at the surgery site, drainage, fever, or pain not relieved by medication.    4) Additional Instructions:        Please contact your physician with any problems or Same Day Surgery at [phone], Monday through Friday 6 am to 4 pm, or [place] at [facility] number at [phone].

## 2020-05-19 NOTE — Anesthesia Postprocedure Evaluation (Signed)
Anesthesia Post Note  Patient: Dustin Crane  Procedure(s) Performed: CYSTOSCOPY WITH INSERTION OF UROLIFT (N/A )  Patient location during evaluation: PACU Anesthesia Type: General Level of consciousness: awake and alert and oriented Pain management: pain level controlled Vital Signs Assessment: post-procedure vital signs reviewed and stable Respiratory status: spontaneous breathing, nonlabored ventilation and respiratory function stable Cardiovascular status: blood pressure returned to baseline and stable Postop Assessment: no signs of nausea or vomiting Anesthetic complications: no   No complications documented.   Last Vitals:  Vitals:   05/19/20 1342 05/19/20 1344  BP:  (!) 179/94  Pulse:    Resp:  20  Temp: (!) 36.1 C (!) 36.3 C  SpO2:  98%    Last Pain:  Vitals:   05/19/20 1344  TempSrc: Temporal  PainSc: 8                  Preston Weill

## 2020-05-19 NOTE — Transfer of Care (Signed)
Immediate Anesthesia Transfer of Care Note  Patient: Dustin Crane  Procedure(s) Performed: CYSTOSCOPY WITH INSERTION OF UROLIFT (N/A )  Patient Location: PACU  Anesthesia Type:General  Level of Consciousness: awake, alert  and oriented  Airway & Oxygen Therapy: Patient Spontanous Breathing and Patient connected to face mask oxygen  Post-op Assessment: Report given to RN and Post -op Vital signs reviewed and stable  Post vital signs: Reviewed and stable  Last Vitals:  Vitals Value Taken Time  BP 119/76 05/19/20 1154  Temp    Pulse 92 05/19/20 1156  Resp 15 05/19/20 1156  SpO2 98 % 05/19/20 1156  Vitals shown include unvalidated device data.  Last Pain:  Vitals:   05/19/20 1055  TempSrc: Temporal  PainSc: 0-No pain         Complications: No complications documented.

## 2020-05-19 NOTE — Anesthesia Preprocedure Evaluation (Signed)
Anesthesia Evaluation  Patient identified by MRN, date of birth, ID band Patient awake    Reviewed: Allergy & Precautions, NPO status , Patient's Chart, lab work & pertinent test results  History of Anesthesia Complications Negative for: history of anesthetic complications  Airway Mallampati: II  TM Distance: >3 FB Neck ROM: Full    Dental  (+) Missing   Pulmonary neg sleep apnea, neg COPD, former smoker,    breath sounds clear to auscultation- rhonchi (-) wheezing      Cardiovascular hypertension, Pt. on medications (-) angina+ CAD, + Past MI, + Cardiac Stents and + Peripheral Vascular Disease  (-) CABG  Rhythm:Regular Rate:Normal - Systolic murmurs and - Diastolic murmurs    Neuro/Psych neg Seizures TIAnegative psych ROS   GI/Hepatic Neg liver ROS, GERD  ,  Endo/Other  diabetes, Oral Hypoglycemic Agents  Renal/GU negative Renal ROS     Musculoskeletal  (+) Arthritis ,   Abdominal (+) - obese,   Peds  Hematology  (+) anemia ,   Anesthesia Other Findings Past Medical History: No date: Arthritis No date: Cancer (HCC)     Comment:  skin cancer with tags removed No date: Coronary artery disease No date: Diabetes mellitus without complication (HCC) No date: GERD (gastroesophageal reflux disease) No date: Hyperlipemia No date: Hypertension 2004: Myocardial infarction [facility])     Comment:  2 stents No date: Peripheral vascular disease ([place]) 10/2018: TIA (transient ischemic attack)     Comment:  PATIENT DENIES THIS DIAGNOSIS No date: Wears glasses No date: Wears hearing aid     Comment:  both ears   Reproductive/Obstetrics                             Anesthesia Physical Anesthesia Plan  ASA: III  Anesthesia Plan: General   Post-op Pain Management:    Induction: Intravenous  PONV Risk Score and Plan: 1 and Propofol infusion  Airway Management Planned: Natural  Airway  Additional Equipment:   Intra-op Plan:   Post-operative Plan:   Informed Consent: I have reviewed the patients History and Physical, chart, labs and discussed the procedure including the risks, benefits and alternatives for the proposed anesthesia with the patient or authorized representative who has indicated his/her understanding and acceptance.     Dental advisory given  Plan Discussed with: CRNA and Anesthesiologist  Anesthesia Plan Comments:         Anesthesia Quick Evaluation

## 2020-05-19 NOTE — Op Note (Signed)
Preoperative diagnosis: BPH with obstructive symptomatology   Postoperative diagnosis: BPH with obstructive symptomatology   Principal procedure: Urolift procedure, with the placement of 6 implants.   Surgeon: Hollice Espy   Anesthesia: LMA   Complications: None   Drains: None   Estimated blood loss: < 5 mL   Indications: 80 year-old male with obstructive symptomatology secondary to BPH.  The patient's symptoms have progressed, and he has requested further management.  Management options including TURP with resection/ablation of the prostate as well as Urolift were discussed.  The patient has chosen to have a Urolift procedure.  He has been instructed to the procedure as well as risks and complications which include but are not limited to infection, bleeding, and inadequate treatment with the Urolift procedure alone, anesthetic complications, among others.  He understands these and desires to proceed.   Findings: Using the 17 French cystoscope, urethra and bladder were inspected.  There were no urethral lesions.  Prostatic urethra was obstructed secondary to bilobar hypertrophy.  The bladder was inspected circumferentially.  This revealed normal findings.   Description of procedure: The patient was properly identified in the holding area.  He received preoperative IV antibiotics.  He was taken to the operating room where general anesthetic was administered with the LMA.  He is placed in the dorsolithotomy position.  Genitalia and perineum were prepped and draped.  Proper timeout was performed.   A 73F cystoscope was inserted into the bladder with findings as described above.  The 1st pair of implants were placed at the bladder neck ~1.5 cm from the bladder Neck. The 2nd pair of implants were placed at the level of the verumontanum.   A repeat cysto was performed and another pair of implants in between the prior pairs.    A final cystoscopy was conducted first to inspect the location and  state of each implant and second, to confirm the presence of a continuous anterior channel was present through the prostatic urethra with irrigation flow turned off.    Six implants were delivered in total.    Following this, the scope was removed.  After anesthetic reversal he was transported to the PACU in stable condition.  He tolerated the procedure well.   Plan: He will follow-up with me in 4 to 6 weeks for IPSS/PVR.

## 2020-05-19 NOTE — Anesthesia Procedure Notes (Signed)
Procedure Name: MAC Date/Time: 05/19/2020 11:34 AM Performed by: Lily Peer, Winola Drum, CRNA Pre-anesthesia Checklist: Patient identified, Emergency Drugs available, Suction available and Patient being monitored Patient Re-evaluated:Patient Re-evaluated prior to induction Oxygen Delivery Method: Simple face mask Induction Type: IV induction

## 2020-05-19 NOTE — H&P (Signed)
H&P updated today Only change s/p cataract surgery RRR CTAB  Dustin Crane [birth year] [id number]  Referring provider: Idelle Crouch, MD [place],  [place] [address]  HPI: 80 year old male with BPH and erectile dysfunction who returns today to discuss possible UroLift procedure.  He has moderately controlled urinary symptoms/IPSS 17/35 on Flomax twice daily.  He does not like the side effects of Flomax including retrograde ejaculation.  He is interested in pursuing UroLift procedure.  He more recently underwent evaluation with TRUS and cystoscopy.  This indicated an 89 cc prostate.  Cystoscopy showed a slightly elevated bladder neck without a discrete median lobe.  Prostamegaly was appreciated.  He is moderately trabeculated.  He is also questions today about the procedure.  He is concerned about sexual side effects.  He hopes that stopping Flomax allow him to ejaculate again.  He also was prescribed tadalafil for erectile dysfunction but has not yet started to take this medication.  He like to hold off until he has a procedure to see how his symptoms improve after stopping Flomax.  No other change in his past medical history.  He takes aspirin for history of CAD.   PMH:     Past Medical History:  Diagnosis Date  . Arthritis   . Coronary artery disease   . Diabetes mellitus without complication ([place])   . GERD (gastroesophageal reflux disease)   . Hyperlipemia   . Hypertension   . TIA (transient ischemic attack) 10/2018  . Wears glasses   . Wears hearing aid    both ears    Surgical History:      Past Surgical History:  Procedure Laterality Date  . ACHILLES TENDON SURGERY Right 12/07/2018   Procedure: ACHILLES TENDON REPAIR SECONDARY;  Surgeon: Albertine Patricia, DPM;  Location: [facility];  Service: Podiatry;  Laterality: Right;  LMA LOCAL DIABETIC   block  . CALCANEAL OSTEOTOMY Right 12/07/2018    Procedure: PARTIAL CALCANECTOMY RIGHT;  Surgeon: Albertine Patricia, DPM;  Location: [facility];  Service: Podiatry;  Laterality: Right;  . CARDIAC CATHETERIZATION  2005   stents x2  . COLONOSCOPY    . FINGER ARTHROPLASTY Right 12/05/2012   Procedure: RIGHT INDEX FINGER IMPLANT ARTHROPLASTY;  Surgeon: Cammie Sickle., MD;  Location: [facility];  Service: Orthopedics;  Laterality: Right;  . HERNIA REPAIR  1990   rt ing   . QUADRICEPS TENDON REPAIR Left 02/11/2017   Procedure: REPAIR QUADRICEP TENDON;  Surgeon: Leim Fabry, MD;  Location: [facility];  Service: Orthopedics;  Laterality: Left;  . SHOULDER ACROMIOPLASTY  1967   [place]   . SHOULDER ARTHROSCOPY  2012   right  . TONSILLECTOMY      Home Medications:       Allergies as of 03/12/2020      Reactions   Haemophilus Influenzae Anaphylaxis, Shortness Of Breath   Rash-severe n/v/d Rash-severe n/v/d Rash-severe n/v/d Rash-severe n/v/d Rash-severe n/v/d Rash-severe n/v/d Rash-severe n/v/d Rash-severe n/v/d Rash-severe n/v/d   Hemophilus B Polysaccharide Vaccine Anaphylaxis, Shortness Of Breath   Rash-severe n/v/d Rash-severe n/v/d   Influenza Vac Split [influenza Virus Vaccine] Shortness Of Breath, Other (See Comments)   Rash-severe n/v/d   Compazine [prochlorperazine Edisylate] Other (See Comments)   Muscle tightness   Hydroxyzine    Other reaction(s): Other (See Comments), Unknown   Prochlorperazine Other (See Comments)   Other reaction(s): Other (See Comments) Muscle tightness Muscle tightness Muscle tightness Muscle tightness  numbness Muscle tightness Muscle tightness Muscle tightness Muscle tightness  numbness         Medication List       Accurate as of March 12, 2020 11:23 AM. If you have any questions, ask your nurse or doctor.        aspirin 81 MG tablet Take 81 mg by mouth daily.   atorvastatin 40 MG tablet Commonly  known as: LIPITOR Take 80 mg by mouth daily.   Fish Oil 1200 MG Caps Take 1,200 mg by mouth 2 (two) times daily.   glimepiride 4 MG tablet Commonly known as: AMARYL Take 4 mg by mouth 2 (two) times daily.   magnesium oxide 400 MG tablet Commonly known as: MAG-OX Take by mouth.   metFORMIN 500 MG tablet Commonly known as: GLUCOPHAGE Take 1,000 mg by mouth 2 (two) times daily with a meal.   multivitamin with minerals tablet Take 0.5 tablets by mouth 2 (two) times daily.   omeprazole 20 MG capsule Commonly known as: PRILOSEC Take 20 mg by mouth daily.   pioglitazone 30 MG tablet Commonly known as: ACTOS Take 30 mg by mouth daily.   ramipril 10 MG capsule Commonly known as: ALTACE Take 10 mg by mouth every evening.   sitaGLIPtin 100 MG tablet Commonly known as: JANUVIA Take 100 mg by mouth daily.   tadalafil 5 MG tablet Commonly known as: CIALIS Take 1 tablet (5 mg total) by mouth daily as needed for erectile dysfunction.   tamsulosin 0.4 MG Caps capsule Commonly known as: FLOMAX Take 1 capsule by mouth 2 (two) times daily.   valACYclovir 1000 MG tablet Commonly known as: VALTREX Take 1,000 mg by mouth 2 (two) times daily.       Allergies:       Allergies  Allergen Reactions  . Haemophilus Influenzae Anaphylaxis and Shortness Of Breath    Rash-severe n/v/d Rash-severe n/v/d Rash-severe n/v/d  Rash-severe n/v/d Rash-severe n/v/d Rash-severe n/v/d Rash-severe n/v/d Rash-severe n/v/d Rash-severe n/v/d   . Hemophilus B Polysaccharide Vaccine Anaphylaxis and Shortness Of Breath    Rash-severe n/v/d Rash-severe n/v/d   . Influenza Vac Split [Influenza Virus Vaccine] Shortness Of Breath and Other (See Comments)    Rash-severe n/v/d  . Compazine [Prochlorperazine Edisylate] Other (See Comments)    Muscle tightness  . Hydroxyzine     Other reaction(s): Other (See Comments), Unknown  . Prochlorperazine Other (See Comments)     Other reaction(s): Other (See Comments) Muscle tightness Muscle tightness Muscle tightness Muscle tightness  numbness Muscle tightness Muscle tightness Muscle tightness Muscle tightness  numbness     Family History:      Family History  Problem Relation Age of Onset  . Coronary artery disease Mother   . Diabetes Mother   . Alzheimer's disease Mother   . Heart disease Father   . Diabetes Father     Social History:  reports that he quit smoking about 44 years ago. He has never used smokeless tobacco. He reports current alcohol use. He reports that he does not use drugs.   Physical Exam: BP 126/69   Pulse 85   Constitutional:  Alert and oriented, No acute distress. HEENT: [place] AT, moist mucus membranes.  Trachea midline, no masses. Cardiovascular: No clubbing, cyanosis, or edema. Respiratory: Normal respiratory effort, no increased work of breathing. Skin: No rashes, bruises or suspicious lesions. Neurologic: Grossly intact, no focal deficits, moving all 4 extremities. Psychiatric: Normal mood and affect.  Assessment & Plan:    1.  BPH with urinary obstruction Lengthy discussion today of a UroLift procedure which is his desired intervention based on previous discussion.  We discussed the procedure itself at length including risk of bleeding, infection, damage to surrounding structures, clip migration, failure of the device to achieve desired goals, need for revision amongst others.  All questions were answered.  We will plan to try to do this under sedation only in the operating room.  It is okay for him to continue aspirin.  We will plan to stop his Flomax after about 2 weeks postop.  He will follow-up with me 4 weeks after the procedure.  2. 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

## 2020-05-20 ENCOUNTER — Encounter: Payer: Self-pay | Admitting: Urology

## 2020-05-20 NOTE — Progress Notes (Signed)
05/21/2020 1:25 PM   Dustin Crane March 13, 1940 [id number]  Referring provider: Idelle Crouch, MD [facility] [address],  [place] [address]  Chief Complaint  Patient presents with  . Post-op Follow-up   Urological history: 1. Elevated PSA - PSA Trend 08/02/2013: 2.45 08/16/2014: 2.09 08/11/2015: 2.32 08/20/2016: 3.23 08/17/2017: 4.48 01/16/2018: 4.82 07/14/2018: 3.27 09/13/2019: 4.02 Component     Latest Ref Rng & Units 10/03/2019  Prostate Specific Ag, Serum     0.0 - 4.0 ng/mL 3.6   2. 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 ED  4. Retrograde ejaculation  HPI: Dustin Crane is a 80 y.o. male who presents today for TOV with his wife, Dustin Crane.   Patient went into post-operative retention after his UroLift.  Catheter removed this morning.  Please see Alise's note.  Patient returned this afternoon unable to urinate in excruciating pain.    PMH: Past Medical History:  Diagnosis Date  . Arthritis   . Cancer ([place])    skin cancer with tags removed  . Coronary artery disease   . Diabetes mellitus without complication ([place])   . GERD (gastroesophageal reflux disease)   . Hyperlipemia   . Hypertension   . Myocardial infarction [facility]) 2004   2 stents  . Peripheral vascular disease ([place])   . TIA (transient ischemic attack) 10/2018   PATIENT DENIES THIS DIAGNOSIS  . Wears glasses   . Wears hearing aid    both ears    Surgical History: Past Surgical History:  Procedure Laterality Date  . ACHILLES TENDON SURGERY Right 12/07/2018   Procedure: ACHILLES TENDON REPAIR SECONDARY;  Surgeon: Albertine Patricia, DPM;  Location: [place];  Service: Podiatry;  Laterality: Right;  LMA LOCAL DIABETIC   block  . CALCANEAL OSTEOTOMY Right 12/07/2018   Procedure: PARTIAL CALCANECTOMY RIGHT;  Surgeon: Albertine Patricia, DPM;  Location: [place];  Service: Podiatry;  Laterality: Right;  .  CARDIAC CATHETERIZATION  2005   stents x2  . CATARACT EXTRACTION W/PHACO Right 04/22/2020   Procedure: CATARACT EXTRACTION PHACO AND INTRAOCULAR LENS PLACEMENT ([facility]) RIGHT DIABETIC 3.65 00:41.9;  Surgeon: Birder Robson, MD;  Location: [place];  Service: Ophthalmology;  Laterality: Right;  . CATARACT EXTRACTION W/PHACO Left 05/06/2020   Procedure: CATARACT EXTRACTION PHACO AND INTRAOCULAR LENS PLACEMENT ([place]) LEFT DIABETIC MYALUGIN;  Surgeon: Birder Robson, MD;  Location: [place];  Service: Ophthalmology;  Laterality: Left;  Dexycu 9%  LOT [id number]  EXP 10/05/2020 given @ 0757 inj OS  8.30 1:12.4  . COLONOSCOPY    . COLONOSCOPY WITH PROPOFOL N/A 04/03/2020   Procedure: COLONOSCOPY WITH PROPOFOL;  Surgeon: [name], MD;  Location: [facility] ENDOSCOPY;  Service: Gastroenterology;  Laterality: N/A;  . CYSTOSCOPY WITH INSERTION OF UROLIFT N/A 05/19/2020   Procedure: CYSTOSCOPY WITH INSERTION OF UROLIFT;  Surgeon: Hollice Espy, MD;  Location: [facility];  Service: Urology;  Laterality: N/A;  . FINGER ARTHROPLASTY Right 12/05/2012   Procedure: RIGHT INDEX FINGER IMPLANT ARTHROPLASTY;  Surgeon: Cammie Sickle., MD;  Location: [place];  Service: Orthopedics;  Laterality: Right;  . HERNIA REPAIR  1990   rt ing   . QUADRICEPS TENDON REPAIR Left 02/11/2017   Procedure: REPAIR QUADRICEP TENDON;  Surgeon: Leim Fabry, MD;  Location: [facility];  Service: Orthopedics;  Laterality: Left;  . SHOULDER ACROMIOPLASTY  1967   [place]   . SHOULDER ARTHROSCOPY  2012   right  . TONSILLECTOMY      Home Medications:  Allergies as of 05/21/2020      Reactions   Haemophilus Influenzae Anaphylaxis, Shortness Of Breath, Rash   Rash-severe n/v/d Ended up in hospital after taking!   Hemophilus B Polysaccharide Vaccine Anaphylaxis, Shortness Of Breath, Rash   Rash-severe n/v/d   Influenza Vac Split [influenza Virus Vaccine] Shortness Of Breath, Other (See  Comments)   Rash-severe n/v/d   Hydroxyzine    Patient denies?????   Compazine [prochlorperazine Edisylate] Other (See Comments)   Muscle tightness      Medication List       Accurate as of May 21, 2020  1:25 PM. If you have any questions, ask your nurse or doctor.        STOP taking these medications   valACYclovir 1000 MG tablet Commonly known as: VALTREX Stopped by: Zara Council, PA-C     TAKE these medications   aspirin 81 MG tablet Take 81 mg by mouth daily.   atorvastatin 40 MG tablet Commonly known as: LIPITOR Take 40 mg by mouth in the morning and at bedtime.   Fish Oil 1200 MG Caps Take 1,200 mg by mouth 3 (three) times a week.   glimepiride 4 MG tablet Commonly known as: AMARYL Take 4 mg by mouth 2 (two) times daily.   HYDROcodone-acetaminophen 5-325 MG tablet Commonly known as: NORCO/VICODIN Take 1-2 tablets by mouth every 6 (six) hours as needed for moderate pain.   magnesium oxide 400 MG tablet Commonly known as: MAG-OX Take by mouth.   metFORMIN 500 MG tablet Commonly known as: GLUCOPHAGE Take 1,000 mg by mouth 2 (two) times daily with a meal.   multivitamin with minerals tablet Take 1 tablet by mouth daily.   omeprazole 20 MG capsule Commonly known as: PRILOSEC Take 20 mg by mouth daily.   pioglitazone 30 MG tablet Commonly known as: ACTOS Take 30 mg by mouth daily.   ramipril 10 MG capsule Commonly known as: ALTACE Take 10 mg by mouth every evening.   sitaGLIPtin 100 MG tablet Commonly known as: JANUVIA Take 100 mg by mouth daily.   tadalafil 5 MG tablet Commonly known as: CIALIS Take 1 tablet (5 mg total) by mouth daily as needed for erectile dysfunction.   tamsulosin 0.4 MG Caps capsule Commonly known as: FLOMAX Take 0.4 mg by mouth 2 (two) times daily.       Allergies:  Allergies  Allergen Reactions  . Haemophilus Influenzae Anaphylaxis, Shortness Of Breath and Rash    Rash-severe n/v/d Ended up in hospital  after taking!   . Hemophilus B Polysaccharide Vaccine Anaphylaxis, Shortness Of Breath and Rash    Rash-severe n/v/d   . Influenza Vac Split [Influenza Virus Vaccine] Shortness Of Breath and Other (See Comments)    Rash-severe n/v/d  . Hydroxyzine     Patient denies?????  . Compazine [Prochlorperazine Edisylate] Other (See Comments)    Muscle tightness    Family History: Family History  Problem Relation Age of Onset  . Coronary artery disease Mother   . Diabetes Mother   . Alzheimer's disease Mother   . Heart disease Father   . Diabetes Father     Social History:  reports that he quit smoking about 44 years ago. He has never used smokeless tobacco. He reports current alcohol use. He reports that he does not use drugs.  ROS: Pertinent ROS in HPI  Physical Exam: Constitutional:  Well nourished. Alert and oriented,  No acute distress. HEENT: [place] AT, mask in place.  Trachea midline Cardiovascular: No clubbing, cyanosis, or edema. Respiratory: Normal respiratory effort, no increased work of breathing. Neurologic: Grossly intact, no focal deficits, moving all 4 extremities. Psychiatric: Normal mood and affect.  Laboratory Data: Specimen:  Blood  Ref Range & Units 1 mo ago  Glucose 70 - 110 mg/dL 120High   Sodium 136 - 145 mmol/L 143   Potassium 3.6 - 5.1 mmol/L 4.3   Chloride 97 - 109 mmol/L 105   Carbon Dioxide (CO2) 22.0 - 32.0 mmol/L 29.5   Urea Nitrogen (BUN) 7 - 25 mg/dL 13   Creatinine 0.7 - 1.3 mg/dL 0.8   Glomerular Filtration Rate (eGFR), MDRD Estimate >60 mL/min/1.73sq m 94   Calcium 8.7 - 10.3 mg/dL 9.5   AST  8 - 39 U/L 14   ALT  6 - 57 U/L 17   Alk Phos (alkaline Phosphatase) 34 - 104 U/L 73   Albumin 3.5 - 4.8 g/dL 4.3   Bilirubin, Total 0.3 - 1.2 mg/dL 0.8   Protein, Total 6.1 - 7.9 g/dL 6.8   A/G Ratio 1.0 - 5.0 gm/dL 1.7   Resulting Agency  [facility]  Specimen Collected: 03/26/20 8:51 AM Last Resulted: 03/26/20 3:05 PM  Received  From: [place]  Result Received: 03/31/20 8:52 AM   Specimen:  Blood  Ref Range & Units 1 mo ago  WBC (White Blood Cell Count) 4.1 - 10.2 10^3/uL 7.2   RBC (Red Blood Cell Count) 4.69 - 6.13 10^6/uL 4.38Low   Hemoglobin 14.1 - 18.1 gm/dL 13.3Low   Hematocrit 40.0 - 52.0 % 39.8Low   MCV (Mean Corpuscular Volume) 80.0 - 100.0 fl 90.9   MCH (Mean Corpuscular Hemoglobin) 27.0 - 31.2 pg 30.4   MCHC (Mean Corpuscular Hemoglobin Concentration) 32.0 - 36.0 gm/dL 33.4   Platelet Count 150 - 450 10^3/uL 203   RDW-CV (Red Cell Distribution Width) 11.6 - 14.8 % 12.0   MPV (Mean Platelet Volume) 9.4 - 12.4 fl 10.8   Neutrophils 1.50 - 7.80 10^3/uL 4.23   Lymphocytes 1.00 - 3.60 10^3/uL 2.14   Monocytes 0.00 - 1.50 10^3/uL 0.61   Eosinophils 0.00 - 0.55 10^3/uL 0.18   Basophils 0.00 - 0.09 10^3/uL 0.03   Neutrophil % 32.0 - 70.0 % 58.5   Lymphocyte % 10.0 - 50.0 % 29.6   Monocyte % 4.0 - 13.0 % 8.4   Eosinophil % 1.0 - 5.0 % 2.5   Basophil% 0.0 - 2.0 % 0.4   Immature Granulocyte % <=0.7 % 0.6   Immature Granulocyte Count <=0.06 10^3/L 0.04   Resulting Agency  [facility] - LAB  Specimen Collected: 03/26/20 8:51 AM Last Resulted: 03/26/20 11:32 AM  Received From: [place]  Result Received: 03/31/20 8:52 AM   Specimen:  Blood  Ref Range & Units 1 mo ago  Cholesterol, Total 100 - 200 mg/dL 121   Triglyceride 35 - 199 mg/dL 138   HDL (High Density Lipoprotein) Cholesterol 29.0 - 71.0 mg/dL 39.4   LDL Calculated 0 - 130 mg/dL 54   VLDL Cholesterol mg/dL 28   Cholesterol/HDL Ratio  3.1   Resulting Agency  [facility] - LAB  Specimen Collected: 03/26/20 8:51 AM Last Resulted: 03/26/20 3:07 PM  Received From: [place]  Result Received: 03/31/20 8:52 AM   Specimen:  Blood  Ref Range & Units 1 mo ago  Hemoglobin A1C 4.2 -  5.6 % 7.7High   Average Blood Glucose (Calc) mg/dL 174   Resulting [facility] - LAB   Narrative  Normal Range:  4.2 - 5.6%  Increased Risk: 5.7 - 6.4%  Diabetes:    >= 6.5%  Glycemic Control for adults with diabetes: <7%  Specimen Collected: 03/26/20 8:51 AM Last Resulted: 03/26/20 2:58 PM  Received From: [place]  Result Received: 03/31/20 8:52 AM   Urinalysis Specimen:  Urine  Ref Range & Units 1 mo ago  Color Yellow, Violet, Light Violet, Dark Violet Yellow   Clarity Clear Clear   Specific Gravity 1.000 - 1.030 1.020   pH, Urine 5.0 - 8.0 6.0   Protein, Urinalysis Negative, Trace mg/dL Negative   Glucose, Urinalysis Negative mg/dL Negative   Ketones, Urinalysis Negative mg/dL Negative   Blood, Urinalysis Negative Negative   Nitrite, Urinalysis Negative Negative   Leukocyte Esterase, Urinalysis Negative Negative   White Blood Cells, Urinalysis None Seen, 0-3 /hpf None Seen   Red Blood Cells, Urinalysis None Seen, 0-3 /hpf 0-3   Bacteria, Urinalysis None Seen /hpf None Seen   Squamous Epithelial Cells, Urinalysis Rare, Few, None Seen /hpf Rare   Resulting Agency  [facility] - LAB  Specimen Collected: 03/26/20 8:51 AM Last Resulted: 03/26/20 1:35 PM  Received From: [place]  Result Received: 03/31/20 8:52 AM  I have reviewed the labs.  Simple Catheter Placement  Due to urinary retention patient is present today for a foley cath placement.  Patient was cleaned and prepped in a sterile fashion with betadine. An 18 FR foley catheter was inserted, urine return was noted  650 ml, urine was yellow clear in color.  The balloon was filled with 10cc of sterile water.  A leg bag was attached for drainage. Patient was also given a night bag to take home and was given instruction on how to change from one bag to another.  Patient was given instruction on proper catheter care.  Patient tolerated well, no complications were noted   Performed by: Zara Council, PA-C and Fonnie Jarvis,  CMA    Pertinent Imaging: No recent imaging  Assessment & Plan:    1. 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  These notes generated with voice recognition software. I apologize for typographical errors.  Zara Council, PA-C  [facility] [address]  [place],  [address] [phone]

## 2020-05-21 ENCOUNTER — Encounter: Payer: Self-pay | Admitting: Urology

## 2020-05-21 ENCOUNTER — Other Ambulatory Visit: Payer: Self-pay

## 2020-05-21 ENCOUNTER — Ambulatory Visit (INDEPENDENT_AMBULATORY_CARE_PROVIDER_SITE_OTHER): Payer: Medicare Other | Admitting: Urology

## 2020-05-21 DIAGNOSIS — N138 Other obstructive and reflux uropathy: Secondary | ICD-10-CM

## 2020-05-21 DIAGNOSIS — N401 Enlarged prostate with lower urinary tract symptoms: Secondary | ICD-10-CM

## 2020-05-21 NOTE — Progress Notes (Signed)
Fill and Pull Catheter Removal  Patient is present today for a catheter removal.  Patient was cleaned and prepped in a sterile fashion. 10 ml of water was then drained from the balloon.  A 18FR foley cath was removed from the bladder no complications were noted .  Patient as then given some time to void on their own.  Patient tolerated well.  Performed TD:HRCBU O`Sullivan, RMA

## 2020-05-26 ENCOUNTER — Ambulatory Visit (INDEPENDENT_AMBULATORY_CARE_PROVIDER_SITE_OTHER): Payer: Medicare Other | Admitting: Physician Assistant

## 2020-05-26 ENCOUNTER — Other Ambulatory Visit: Payer: Self-pay

## 2020-05-26 DIAGNOSIS — N3289 Other specified disorders of bladder: Secondary | ICD-10-CM

## 2020-05-26 LAB — BLADDER SCAN AMB NON-IMAGING: Scan Result: 12 ml

## 2020-05-26 MED ORDER — OXYBUTYNIN CHLORIDE 5 MG PO TABS: 5.0000 mg | ORAL_TABLET | Freq: Three times a day (TID) | ORAL | 0 refills | Status: DC | PRN

## 2020-05-26 NOTE — Progress Notes (Signed)
Catheter Removal  Patient is present today for a catheter removal.  37ml of water was drained from the balloon. A 18FR coude foley cath was removed from the bladder no complications were noted . Patient tolerated well.  Performed by: Debroah [place], PA-C   Additional notes: Patient presented to clinic this morning as a walk-in, accompanied by his wife.  They report severe intermittent bladder pain and urinary urgency that has worsened to become continuous over the weekend.  He has not slept well secondary to his 9/10 pain.  Foley catheter is in place draining clear, yellow urine without clots.  Bladder scan with 45mL.  I offered him Foley catheter removal with [facility] for management of his severe bladder spasms and he accepted, see procedure note as above.  Patient's discomfort gradually improved following Foley removal in clinic, however he did have some residual bladder spasms.  I am prescribing a short course of oxybutynin as needed for management of these.  He will follow-up with [facility] as scheduled in 2 days.  Patient is in agreement with this plan.

## 2020-05-26 NOTE — Patient Instructions (Addendum)
We removed your Foley catheter in clinic today. Please start in and out catheterization at home. I've given you samples of catheters to use.  Please plan to self-cath every 4 hours during the daytime or as needed for management of urinary urgency. If you notice urinary output greater than 479mL with self-catheterization (aside from your first morning catheterization, which I would expect to be relatively higher), consider increasing the frequency of catheterization.

## 2020-05-27 ENCOUNTER — Telehealth: Payer: Self-pay

## 2020-05-27 ENCOUNTER — Inpatient Hospital Stay
Admission: EM | Admit: 2020-05-27 | Discharge: 2020-05-30 | DRG: 698 | Disposition: A | Discharge status: Home Health | Payer: Medicare Other | Attending: Internal Medicine | Admitting: Internal Medicine

## 2020-05-27 ENCOUNTER — Emergency Department: Payer: Medicare Other

## 2020-05-27 ENCOUNTER — Other Ambulatory Visit: Payer: Self-pay

## 2020-05-27 ENCOUNTER — Ambulatory Visit (INDEPENDENT_AMBULATORY_CARE_PROVIDER_SITE_OTHER): Payer: Medicare Other | Admitting: Physician Assistant

## 2020-05-27 ENCOUNTER — Inpatient Hospital Stay: Payer: Medicare Other

## 2020-05-27 ENCOUNTER — Encounter: Payer: Self-pay | Admitting: Internal Medicine

## 2020-05-27 VITALS — BP 136/96 | HR 72

## 2020-05-27 DIAGNOSIS — R410 Disorientation, unspecified: Secondary | ICD-10-CM | POA: Diagnosis present

## 2020-05-27 DIAGNOSIS — A419 Sepsis, unspecified organism: Secondary | ICD-10-CM | POA: Diagnosis not present

## 2020-05-27 DIAGNOSIS — E1165 Type 2 diabetes mellitus with hyperglycemia: Secondary | ICD-10-CM | POA: Diagnosis present

## 2020-05-27 DIAGNOSIS — I2510 Atherosclerotic heart disease of native coronary artery without angina pectoris: Secondary | ICD-10-CM | POA: Diagnosis present

## 2020-05-27 DIAGNOSIS — R338 Other retention of urine: Secondary | ICD-10-CM | POA: Diagnosis not present

## 2020-05-27 DIAGNOSIS — H9190 Unspecified hearing loss, unspecified ear: Secondary | ICD-10-CM | POA: Diagnosis present

## 2020-05-27 DIAGNOSIS — Y846 Urinary catheterization as the cause of abnormal reaction of the patient, or of later complication, without mention of misadventure at the time of the procedure: Secondary | ICD-10-CM | POA: Diagnosis present

## 2020-05-27 DIAGNOSIS — R569 Unspecified convulsions: Secondary | ICD-10-CM | POA: Diagnosis present

## 2020-05-27 DIAGNOSIS — Z7984 Long term (current) use of oral hypoglycemic drugs: Secondary | ICD-10-CM

## 2020-05-27 DIAGNOSIS — A4159 Other Gram-negative sepsis: Secondary | ICD-10-CM | POA: Diagnosis present

## 2020-05-27 DIAGNOSIS — Z20822 Contact with and (suspected) exposure to covid-19: Secondary | ICD-10-CM | POA: Diagnosis present

## 2020-05-27 DIAGNOSIS — R55 Syncope and collapse: Secondary | ICD-10-CM | POA: Diagnosis not present

## 2020-05-27 DIAGNOSIS — Z888 Allergy status to other drugs, medicaments and biological substances status: Secondary | ICD-10-CM

## 2020-05-27 DIAGNOSIS — N200 Calculus of kidney: Secondary | ICD-10-CM | POA: Diagnosis present

## 2020-05-27 DIAGNOSIS — E872 Acidosis, unspecified: Secondary | ICD-10-CM

## 2020-05-27 DIAGNOSIS — I252 Old myocardial infarction: Secondary | ICD-10-CM | POA: Diagnosis not present

## 2020-05-27 DIAGNOSIS — Z87891 Personal history of nicotine dependence: Secondary | ICD-10-CM

## 2020-05-27 DIAGNOSIS — E1151 Type 2 diabetes mellitus with diabetic peripheral angiopathy without gangrene: Secondary | ICD-10-CM | POA: Diagnosis present

## 2020-05-27 DIAGNOSIS — R6520 Severe sepsis without septic shock: Secondary | ICD-10-CM | POA: Diagnosis present

## 2020-05-27 DIAGNOSIS — D72828 Other elevated white blood cell count: Secondary | ICD-10-CM | POA: Diagnosis not present

## 2020-05-27 DIAGNOSIS — Z7982 Long term (current) use of aspirin: Secondary | ICD-10-CM | POA: Diagnosis not present

## 2020-05-27 DIAGNOSIS — Z79899 Other long term (current) drug therapy: Secondary | ICD-10-CM

## 2020-05-27 DIAGNOSIS — E876 Hypokalemia: Secondary | ICD-10-CM | POA: Diagnosis present

## 2020-05-27 DIAGNOSIS — Z72820 Sleep deprivation: Secondary | ICD-10-CM

## 2020-05-27 DIAGNOSIS — T83511A Infection and inflammatory reaction due to indwelling urethral catheter, initial encounter: Principal | ICD-10-CM | POA: Diagnosis present

## 2020-05-27 DIAGNOSIS — Z955 Presence of coronary angioplasty implant and graft: Secondary | ICD-10-CM

## 2020-05-27 DIAGNOSIS — Z833 Family history of diabetes mellitus: Secondary | ICD-10-CM

## 2020-05-27 DIAGNOSIS — N390 Urinary tract infection, site not specified: Secondary | ICD-10-CM | POA: Diagnosis present

## 2020-05-27 DIAGNOSIS — Z8673 Personal history of transient ischemic attack (TIA), and cerebral infarction without residual deficits: Secondary | ICD-10-CM

## 2020-05-27 DIAGNOSIS — N3289 Other specified disorders of bladder: Secondary | ICD-10-CM

## 2020-05-27 DIAGNOSIS — G9340 Encephalopathy, unspecified: Secondary | ICD-10-CM

## 2020-05-27 DIAGNOSIS — G9341 Metabolic encephalopathy: Secondary | ICD-10-CM | POA: Diagnosis present

## 2020-05-27 DIAGNOSIS — I10 Essential (primary) hypertension: Secondary | ICD-10-CM | POA: Diagnosis present

## 2020-05-27 DIAGNOSIS — E785 Hyperlipidemia, unspecified: Secondary | ICD-10-CM | POA: Diagnosis present

## 2020-05-27 DIAGNOSIS — Z8249 Family history of ischemic heart disease and other diseases of the circulatory system: Secondary | ICD-10-CM

## 2020-05-27 DIAGNOSIS — K219 Gastro-esophageal reflux disease without esophagitis: Secondary | ICD-10-CM | POA: Diagnosis present

## 2020-05-27 DIAGNOSIS — Z887 Allergy status to serum and vaccine status: Secondary | ICD-10-CM

## 2020-05-27 DIAGNOSIS — Z820 Family history of epilepsy and other diseases of the nervous system: Secondary | ICD-10-CM

## 2020-05-27 LAB — URINE DRUG SCREEN, QUALITATIVE (ARMC ONLY)
Amphetamines, Ur Screen: NOT DETECTED
Barbiturates, Ur Screen: NOT DETECTED
Benzodiazepine, Ur Scrn: NOT DETECTED
Cannabinoid 50 Ng, Ur ~~LOC~~: NOT DETECTED
Cocaine Metabolite,Ur ~~LOC~~: NOT DETECTED
MDMA (Ecstasy)Ur Screen: NOT DETECTED
Methadone Scn, Ur: NOT DETECTED
Opiate, Ur Screen: NOT DETECTED
Phencyclidine (PCP) Ur S: NOT DETECTED
Tricyclic, Ur Screen: NOT DETECTED

## 2020-05-27 LAB — COMPREHENSIVE METABOLIC PANEL
ALT: 22 U/L (ref 0–44)
AST: 43 U/L — ABNORMAL HIGH (ref 15–41)
Albumin: 4.2 g/dL (ref 3.5–5.0)
Alkaline Phosphatase: 85 U/L (ref 38–126)
Anion gap: 27 — ABNORMAL HIGH (ref 5–15)
BUN: 17 mg/dL (ref 8–23)
CO2: 13 mmol/L — ABNORMAL LOW (ref 22–32)
Calcium: 9.4 mg/dL (ref 8.9–10.3)
Chloride: 98 mmol/L (ref 98–111)
Creatinine, Ser: 1.13 mg/dL (ref 0.61–1.24)
GFR, Estimated: >60 mL/min (ref >60)
Glucose, Bld: 307 mg/dL — ABNORMAL HIGH (ref 70–99)
Potassium: 3.3 mmol/L — ABNORMAL LOW (ref 3.5–5.1)
Sodium: 138 mmol/L (ref 135–145)
Total Bilirubin: 1.6 mg/dL — ABNORMAL HIGH (ref 0.3–1.2)
Total Protein: 7.9 g/dL (ref 6.5–8.1)

## 2020-05-27 LAB — CBC WITH DIFFERENTIAL/PLATELET
Abs Immature Granulocytes: 0.13 10*3/uL — ABNORMAL HIGH (ref 0.00–0.07)
Basophils Absolute: 0.1 10*3/uL (ref 0.0–0.1)
Basophils Relative: 0 %
Eosinophils Absolute: 0.1 10*3/uL (ref 0.0–0.5)
Eosinophils Relative: 1 %
HCT: 44.4 % (ref 39.0–52.0)
Hemoglobin: 14.7 g/dL (ref 13.0–17.0)
Immature Granulocytes: 1 %
Lymphocytes Relative: 23 %
Lymphs Abs: 4.8 10*3/uL — ABNORMAL HIGH (ref 0.7–4.0)
MCH: 30 pg (ref 26.0–34.0)
MCHC: 33.1 g/dL (ref 30.0–36.0)
MCV: 90.6 fL (ref 80.0–100.0)
Monocytes Absolute: 1.6 10*3/uL — ABNORMAL HIGH (ref 0.1–1.0)
Monocytes Relative: 8 %
Neutro Abs: 14.5 10*3/uL — ABNORMAL HIGH (ref 1.7–7.7)
Neutrophils Relative %: 67 %
Platelets: 284 10*3/uL (ref 150–400)
RBC: 4.9 MIL/uL (ref 4.22–5.81)
RDW: 12.1 % (ref 11.5–15.5)
WBC: 21.2 10*3/uL — ABNORMAL HIGH (ref 4.0–10.5)
nRBC: 0 % (ref 0.0–0.2)

## 2020-05-27 LAB — URINALYSIS, COMPLETE (UACMP) WITH MICROSCOPIC
Bilirubin Urine: NEGATIVE
Glucose, UA: 50 mg/dL — AB
Ketones, ur: 5 mg/dL — AB
Nitrite: POSITIVE — AB
Protein, ur: 100 mg/dL — AB
RBC / HPF: >50 RBC/hpf — ABNORMAL HIGH (ref 0–5)
Specific Gravity, Urine: 1.01 (ref 1.005–1.030)
WBC, UA: >50 WBC/hpf — ABNORMAL HIGH (ref 0–5)
pH: 6 (ref 5.0–8.0)

## 2020-05-27 LAB — TROPONIN I (HIGH SENSITIVITY)
Troponin I (High Sensitivity): 21 ng/L — ABNORMAL HIGH (ref <18)
Troponin I (High Sensitivity): 25 ng/L — ABNORMAL HIGH (ref <18)

## 2020-05-27 LAB — BLOOD GAS, VENOUS
Acid-Base Excess: 0.9 mmol/L (ref 0.0–2.0)
Bicarbonate: 25.3 mmol/L (ref 20.0–28.0)
O2 Saturation: 81.7 %
Patient temperature: 37
pCO2, Ven: 39 mmHg — ABNORMAL LOW (ref 44.0–60.0)
pH, Ven: 7.42 (ref 7.250–7.430)
pO2, Ven: 45 mmHg (ref 32.0–45.0)

## 2020-05-27 LAB — RESP PANEL BY RT-PCR (FLU A&B, COVID) ARPGX2
Influenza A by PCR: NEGATIVE
Influenza B by PCR: NEGATIVE
SARS Coronavirus 2 by RT PCR: NEGATIVE

## 2020-05-27 LAB — CBG MONITORING, ED
Glucose-Capillary: 284 mg/dL — ABNORMAL HIGH (ref 70–99)
Glucose-Capillary: 271 mg/dL — ABNORMAL HIGH (ref 70–99)

## 2020-05-27 LAB — LACTIC ACID, PLASMA
Lactic Acid, Venous: 6 mmol/L (ref 0.5–1.9)
Lactic Acid, Venous: 3 mmol/L (ref 0.5–1.9)
Lactic Acid, Venous: 3.6 mmol/L (ref 0.5–1.9)
Lactic Acid, Venous: 1.7 mmol/L (ref 0.5–1.9)

## 2020-05-27 LAB — BLADDER SCAN AMB NON-IMAGING: Scan Result: 233 mL

## 2020-05-27 LAB — CK: Total CK: 270 U/L (ref 49–397)

## 2020-05-27 LAB — BETA-HYDROXYBUTYRIC ACID: Beta-Hydroxybutyric Acid: 0.52 mmol/L — ABNORMAL HIGH (ref 0.05–0.27)

## 2020-05-27 LAB — GLUCOSE, CAPILLARY
Glucose-Capillary: 183 mg/dL — ABNORMAL HIGH (ref 70–99)
Glucose-Capillary: 233 mg/dL — ABNORMAL HIGH (ref 70–99)
Glucose-Capillary: 270 mg/dL — ABNORMAL HIGH (ref 70–99)

## 2020-05-27 LAB — HEMOGLOBIN A1C
Hgb A1c MFr Bld: 8.1 % — ABNORMAL HIGH (ref 4.8–5.6)
Mean Plasma Glucose: 185.77 mg/dL

## 2020-05-27 LAB — MAGNESIUM: Magnesium: 1.4 mg/dL — ABNORMAL LOW (ref 1.7–2.4)

## 2020-05-27 LAB — MRSA PCR SCREENING: MRSA by PCR: NEGATIVE

## 2020-05-27 MED ORDER — FENTANYL CITRATE (PF) 100 MCG/2ML IJ SOLN
25.0000 ug | Freq: Once | INTRAMUSCULAR | Status: AC
  Administered 2020-05-27: 25 ug via INTRAVENOUS
  Filled 2020-05-27: qty 2

## 2020-05-27 MED ORDER — SODIUM CHLORIDE 0.9 % IV SOLN
2.0000 g | Freq: Two times a day (BID) | INTRAVENOUS | Status: DC
  Administered 2020-05-28: 2 g via INTRAVENOUS
  Filled 2020-05-27 (×2): qty 2

## 2020-05-27 MED ORDER — FERROUS SULFATE 325 (65 FE) MG PO TABS
325.0000 mg | ORAL_TABLET | ORAL | Status: DC
  Administered 2020-05-28 – 2020-05-30 (×2): 325 mg via ORAL
  Filled 2020-05-27 (×2): qty 1

## 2020-05-27 MED ORDER — GADOBUTROL 1 MMOL/ML IV SOLN
8.0000 mL | Freq: Once | INTRAVENOUS | Status: AC | PRN
  Administered 2020-05-27: 8 mL via INTRAVENOUS

## 2020-05-27 MED ORDER — ONDANSETRON HCL 4 MG PO TABS: 4.0000 mg | ORAL_TABLET | Freq: Four times a day (QID) | ORAL | Status: DC | PRN

## 2020-05-27 MED ORDER — ATORVASTATIN CALCIUM 20 MG PO TABS
40.0000 mg | ORAL_TABLET | Freq: Every day | ORAL | Status: DC
  Administered 2020-05-28 – 2020-05-30 (×3): 40 mg via ORAL
  Filled 2020-05-27 (×3): qty 2

## 2020-05-27 MED ORDER — ADULT MULTIVITAMIN W/MINERALS CH
1.0000 | ORAL_TABLET | Freq: Every day | ORAL | Status: DC
  Administered 2020-05-28 – 2020-05-30 (×3): 1 via ORAL
  Filled 2020-05-27 (×3): qty 1

## 2020-05-27 MED ORDER — OXYBUTYNIN CHLORIDE 5 MG PO TABS
5.0000 mg | ORAL_TABLET | Freq: Three times a day (TID) | ORAL | Status: DC | PRN
  Administered 2020-05-28 – 2020-05-29 (×4): 5 mg via ORAL
  Filled 2020-05-27 (×7): qty 1

## 2020-05-27 MED ORDER — PHENAZOPYRIDINE HCL 200 MG PO TABS
200.0000 mg | ORAL_TABLET | Freq: Once | ORAL | Status: AC
  Administered 2020-05-27: 200 mg via ORAL
  Filled 2020-05-27: qty 1

## 2020-05-27 MED ORDER — MAGNESIUM SULFATE 4 GM/100ML IV SOLN
4.0000 g | Freq: Once | INTRAVENOUS | Status: AC
  Administered 2020-05-27: 4 g via INTRAVENOUS
  Filled 2020-05-27: qty 100

## 2020-05-27 MED ORDER — SODIUM CHLORIDE 0.9 % IV SOLN: 2.0000 g | Freq: Once | INTRAVENOUS | Status: DC

## 2020-05-27 MED ORDER — POTASSIUM CHLORIDE IN NACL 20-0.9 MEQ/L-% IV SOLN
INTRAVENOUS | Status: DC
  Filled 2020-05-27 (×9): qty 1000

## 2020-05-27 MED ORDER — ORAL CARE MOUTH RINSE
15.0000 mL | Freq: Two times a day (BID) | OROMUCOSAL | Status: DC
  Administered 2020-05-27 – 2020-05-30 (×6): 15 mL via OROMUCOSAL

## 2020-05-27 MED ORDER — SODIUM CHLORIDE 0.9 % IV BOLUS: 1000.0000 mL | Freq: Once | INTRAVENOUS | Status: DC

## 2020-05-27 MED ORDER — CHLORHEXIDINE GLUCONATE CLOTH 2 % EX PADS
6.0000 | MEDICATED_PAD | Freq: Every day | CUTANEOUS | Status: DC
  Administered 2020-05-27 – 2020-05-30 (×4): 6 via TOPICAL

## 2020-05-27 MED ORDER — SODIUM CHLORIDE 0.9 % IV SOLN
2.0000 g | Freq: Once | INTRAVENOUS | Status: AC
  Administered 2020-05-27: 2 g via INTRAVENOUS
  Filled 2020-05-27: qty 2

## 2020-05-27 MED ORDER — MAGNESIUM SULFATE 2 GM/50ML IV SOLN: 2.0000 g | Freq: Once | INTRAVENOUS | Status: DC

## 2020-05-27 MED ORDER — BELLADONNA ALKALOIDS-OPIUM 16.2-60 MG RE SUPP
1.0000 | Freq: Three times a day (TID) | RECTAL | Status: DC | PRN
Start: 2020-05-28 — End: 2020-05-30
  Administered 2020-05-29: 1 via RECTAL
  Filled 2020-05-27 (×3): qty 1

## 2020-05-27 MED ORDER — PANTOPRAZOLE SODIUM 40 MG IV SOLR
40.0000 mg | INTRAVENOUS | Status: DC
  Administered 2020-05-27 – 2020-05-29 (×3): 40 mg via INTRAVENOUS
  Filled 2020-05-27 (×3): qty 40

## 2020-05-27 MED ORDER — ASPIRIN EC 81 MG PO TBEC
81.0000 mg | DELAYED_RELEASE_TABLET | Freq: Every day | ORAL | Status: DC
  Administered 2020-05-28 – 2020-05-30 (×3): 81 mg via ORAL
  Filled 2020-05-27 (×3): qty 1

## 2020-05-27 MED ORDER — MORPHINE SULFATE (PF) 2 MG/ML IV SOLN
INTRAVENOUS | Status: AC
  Administered 2020-05-27: 2 mg via INTRAVENOUS
  Filled 2020-05-27: qty 1

## 2020-05-27 MED ORDER — ONDANSETRON HCL 4 MG/2ML IJ SOLN: 4.0000 mg | Freq: Four times a day (QID) | INTRAMUSCULAR | Status: DC | PRN

## 2020-05-27 MED ORDER — LORAZEPAM 2 MG/ML IJ SOLN: 2.0000 mg | INTRAMUSCULAR | Status: DC | PRN

## 2020-05-27 MED ORDER — FISH OIL 1200 MG PO CAPS: 1200.0000 mg | ORAL_CAPSULE | Freq: Every day | ORAL | Status: DC

## 2020-05-27 MED ORDER — SODIUM CHLORIDE 0.9 % IV BOLUS
1000.0000 mL | Freq: Once | INTRAVENOUS | Status: AC
  Administered 2020-05-27: 1000 mL via INTRAVENOUS

## 2020-05-27 MED ORDER — LORAZEPAM 2 MG/ML IJ SOLN: 2.0000 mg | INTRAVENOUS | Status: DC | PRN

## 2020-05-27 MED ORDER — LORAZEPAM 2 MG/ML IJ SOLN
1.0000 mg | Freq: Once | INTRAMUSCULAR | Status: AC | PRN
  Administered 2020-05-27: 1 mg via INTRAVENOUS
  Filled 2020-05-27: qty 1

## 2020-05-27 MED ORDER — VITAMIN D 25 MCG (1000 UNIT) PO TABS
1000.0000 [IU] | ORAL_TABLET | Freq: Every day | ORAL | Status: DC
  Administered 2020-05-28 – 2020-05-30 (×3): 1000 [IU] via ORAL
  Filled 2020-05-27 (×3): qty 1

## 2020-05-27 MED ORDER — PANTOPRAZOLE SODIUM 40 MG PO TBEC: 40.0000 mg | DELAYED_RELEASE_TABLET | Freq: Every day | ORAL | Status: DC

## 2020-05-27 MED ORDER — BELLADONNA ALKALOIDS-OPIUM 16.2-60 MG RE SUPP
1.0000 | Freq: Once | RECTAL | Status: AC
Start: 2020-05-27 — End: 2020-05-27
  Administered 2020-05-27: 1 via RECTAL
  Filled 2020-05-27: qty 1

## 2020-05-27 MED ORDER — MAGNESIUM SULFATE 2 GM/50ML IV SOLN
2.0000 g | Freq: Once | INTRAVENOUS | Status: AC
  Administered 2020-05-27: 2 g via INTRAVENOUS
  Filled 2020-05-27: qty 50

## 2020-05-27 MED ORDER — ENOXAPARIN SODIUM 40 MG/0.4ML ~~LOC~~ SOLN
40.0000 mg | SUBCUTANEOUS | Status: DC
  Administered 2020-05-27 – 2020-05-29 (×3): 40 mg via SUBCUTANEOUS
  Filled 2020-05-27 (×3): qty 0.4

## 2020-05-27 MED ORDER — SODIUM CHLORIDE 0.9 % IV BOLUS
500.0000 mL | Freq: Once | INTRAVENOUS | Status: AC
  Administered 2020-05-27: 500 mL via INTRAVENOUS

## 2020-05-27 MED ORDER — MORPHINE SULFATE (PF) 2 MG/ML IV SOLN
2.0000 mg | INTRAVENOUS | Status: DC | PRN
  Administered 2020-05-28 – 2020-05-29 (×4): 2 mg via INTRAVENOUS
  Filled 2020-05-27 (×4): qty 1

## 2020-05-27 NOTE — Progress Notes (Signed)
Notified bedside nurse of need to draw repeat lactic acid. 

## 2020-05-27 NOTE — ED Notes (Signed)
Pt back from CT

## 2020-05-27 NOTE — Progress Notes (Signed)
eeg done °

## 2020-05-27 NOTE — Progress Notes (Signed)
Following for code sepsis 

## 2020-05-27 NOTE — Progress Notes (Signed)
Pt arrived to the floor at 6:40, c/o severe abdominal pain. Bladder scan showed >652, MD Scott notified. Order for 16 fr foley catheter in place and inserted by CN.

## 2020-05-27 NOTE — ED Notes (Signed)
Patient transported to CT 

## 2020-05-27 NOTE — Progress Notes (Signed)
Patient was a medical alert in [place]. Responded, patient was being cared for by medical team, wife had been separated to another room. She was shaken by the incident. I was able to escort her to ED, patient was taken to CT, wife left in room as patient returned, doctor would brief her as soon as possible.

## 2020-05-27 NOTE — Consult Note (Signed)
Neurology Consultation Reason for Consult: syncope vs. seizure Requesting Physician: Duffy Bruce   CC: Loss of consciousness, pain  History is obtained from: Wife, chart review (patient too limited by pain)  HPI: Dustin Crane is a 80 y.o. male with a past medical history significant for hypertension, hyperlipidemia, diabetes, coronary artery disease s/p stents (2004), skin cancer, hearing loss requiring hearing aids, BPH.  On 05/20/2019 he had a UroLift procedure.  Since then he has been having significant pain, difficulty voiding, and home cathetherization performed by his wife (an experienced ICU nurse has been challenging).  Symptoms have been managed by Foley placement, Foley removal, and in and out catheterizations.  Unfortunately the severe pain has been disruptive to his sleep.  He was at urology clinic for further evaluation of this problem today, when he had a spell of altered awareness.  He had just been in significant pain and then perhaps relaxed over to the side that appeared to be stretching his right arm out and possibly turning his head towards the right side.  Unclear if he had right gaze deviation or not.  He then had 3 to 4 minutes of generalized tonic-clonic activity, for which a rapid response team was called.  AED was placed, pulses remained palpable and shock was not advised.  He has been confused since then but he has also been confused in the setting of loss of sleep and pain for some time now.  Notably he was never hypotensive at the doctor's appointment or in the ED, though he was also not significantly hypertensive despite his severe pain.  Initial labs were notable for  lactate elevation to 6,  normal creatinine (1.13) and GFR greater than 60, troponin elevation to 21, mild AST elevation of 43, glucose of 307, mild hypokalemia to 3.3, hypomagnesemia to 1.4, anion gap of 27, low bicarb to 13, elevated beta hydroxybutyrate 0.52, Leukocytosis to 21.2 without  thrombocytosis; and normal red blood cell count and hemoglobin UA with moderate leukocytes, positive nitrites, elevated protein, many bacteria, large hemoglobin pigment and many red blood cells  Head CT was negative for acute intracranial process He had a recent MRI brain on 05/13/2020 for postural dizziness and ataxia which is notable for 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  Additionally, he recently had cataract surgery (2/15 and 3/1)  Past Medical History:  Diagnosis Date  . Arthritis   . Cancer ([place])    skin cancer with tags removed  . Coronary artery disease   . Diabetes mellitus without complication ([place])   . GERD (gastroesophageal reflux disease)   . Hyperlipemia   . Hypertension   . Myocardial infarction [facility]) 2004   2 stents  . Peripheral vascular disease ([place])   . TIA (transient ischemic attack) 10/2018   PATIENT DENIES THIS DIAGNOSIS  . Wears glasses   . Wears hearing aid    both ears   Past Surgical History:  Procedure Laterality Date  . ACHILLES TENDON SURGERY Right 12/07/2018   Procedure: ACHILLES TENDON REPAIR SECONDARY;  Surgeon: Albertine Patricia, DPM;  Location: [place];  Service: Podiatry;  Laterality: Right;  LMA LOCAL DIABETIC   block  . CALCANEAL OSTEOTOMY Right 12/07/2018   Procedure: PARTIAL CALCANECTOMY RIGHT;  Surgeon: Albertine Patricia, DPM;  Location: [place];  Service: Podiatry;  Laterality: Right;  . CARDIAC CATHETERIZATION  2005   stents x2  . CATARACT EXTRACTION W/PHACO Right 04/22/2020   Procedure: CATARACT EXTRACTION PHACO AND INTRAOCULAR LENS PLACEMENT ([facility]) RIGHT DIABETIC 3.65 00:41.9;  Surgeon: Birder Robson, MD;  Location: [place];  Service: Ophthalmology;  Laterality: Right;   . CATARACT EXTRACTION W/PHACO Left 05/06/2020   Procedure: CATARACT EXTRACTION PHACO AND INTRAOCULAR LENS PLACEMENT ([place]) LEFT DIABETIC MYALUGIN;  Surgeon: Birder Robson, MD;  Location: [place];  Service: Ophthalmology;  Laterality: Left;  Dexycu 9%  LOT [id number]  EXP 10/05/2020 given @ 0757 inj OS  8.30 1:12.4  . COLONOSCOPY    . COLONOSCOPY WITH PROPOFOL N/A 04/03/2020   Procedure: COLONOSCOPY WITH PROPOFOL;  Surgeon: [name], MD;  Location: [facility];  Service: Gastroenterology;  Laterality: N/A;  . CYSTOSCOPY WITH INSERTION OF UROLIFT N/A 05/19/2020   Procedure: CYSTOSCOPY WITH INSERTION OF UROLIFT;  Surgeon: Hollice Espy, MD;  Location: [facility];  Service: Urology;  Laterality: N/A;  . FINGER ARTHROPLASTY Right 12/05/2012   Procedure: RIGHT INDEX FINGER IMPLANT ARTHROPLASTY;  Surgeon: Cammie Sickle., MD;  Location: [place];  Service: Orthopedics;  Laterality: Right;  . HERNIA REPAIR  1990   rt ing   . QUADRICEPS TENDON REPAIR Left 02/11/2017   Procedure: REPAIR QUADRICEP TENDON;  Surgeon: Leim Fabry, MD;  Location: [facility];  Service: Orthopedics;  Laterality: Left;  . SHOULDER ACROMIOPLASTY  1967   [place]   . SHOULDER ARTHROSCOPY  2012   right  . TONSILLECTOMY     Current Outpatient Medications  Medication Instructions  . aspirin 81 mg, Oral, Daily  . atorvastatin (LIPITOR) 40 mg, Oral, 2 times daily  . cholecalciferol (VITAMIN D3) 1,000 Units, Oral, Daily  . ferrous sulfate 325 mg, Oral, 3 times weekly  . Fish Oil 1,200 mg, Oral, Daily  . glimepiride (AMARYL) 4 mg, Oral, 2 times daily  . HYDROcodone-acetaminophen (NORCO/VICODIN) 5-325 MG tablet 1-2 tablets, Oral, Every 6 hours PRN  . metFORMIN (GLUCOPHAGE) 1,000 mg, Oral, 2 times daily with meals  . Multiple Vitamins-Minerals (MULTIVITAMIN WITH MINERALS) tablet 1 tablet, Oral, Daily  . omeprazole (PRILOSEC) 20 mg, Oral, 2 times daily  . oxybutynin (DITROPAN) 5 mg,  Oral, Every 8 hours PRN  . pioglitazone (ACTOS) 30 mg, Oral, Daily  . ramipril (ALTACE) 10 mg, Oral, Every evening  . sitaGLIPtin (JANUVIA) 100 mg, Oral, Daily  . tamsulosin (FLOMAX) 0.4 mg, Oral, 2 times daily   Family History  Problem Relation Age of Onset  . Coronary artery disease Mother   . Diabetes Mother   . Alzheimer's disease Mother   . Heart disease Father   . Diabetes Father     Social History:  reports that he quit smoking about 44 years ago. He has never used smokeless tobacco. He reports current alcohol use. He reports that he does not use drugs.   Exam: Current vital signs: BP 135/76   Pulse 98   Temp 98 F (36.7 C) (Oral)   Resp 16   Ht 5\' 8"  (1.727 m)   Wt 81.2 kg   SpO2 96%   BMI 27.22 kg/m  Vital signs in last 24 hours: Temp:  [98 F (36.7 C)] 98 F (36.7 C) (03/22 1415) Pulse Rate:  [72-116] 98 (03/22 1430) Resp:  [16-26] 16 (03/22 1430) BP: (135-150)/(76-96) 135/76 (03/22 1430) SpO2:  [95 %-100 %] 96 % (03/22 1430) Weight:  [81.2 kg] 81.2 kg (03/22  1419)   Physical Exam  Constitutional: Appears well-developed and well-nourished.  Psych: Writhing in pain, intermittently able to cooperate with examination Eyes: No scleral injection, does have light sensitivity which wife reports is stable since his recent cataract surgery HENT: No oropharyngeal obstruction.  No significant nuchal rigidity MSK: no joint deformities.  Cardiovascular: Tachycardic with regular rhythm.  Respiratory: Labored breathing secondary to pain but no grossly audible wheezing GI: No distention, intermittently clutching his belly secondary to pain Skin: Warm dry and intact visible skin  Neuro: Mental Status: Patient is awake, alert, but has difficulty answering orientation questions secondary to his pain.  He is able to follow comfortable [place] Cranial Nerves: II: Visual Fields are full. Pupils are notable for left pupil irregularity (postsurgical), but both are reactive,  roughly 3 to 1 mm bilaterally III,IV, VI: EOMI without ptosis or diploplia.  V: Facial sensation is symmetric to light eyelash brush VII: Facial movement is symmetric.  VIII: hearing is intact to voice (baseline hard of hearing but does orient bilaterally) X/XI difficult to assess given mental status  XII: tongue is midline  Sensory/motor: Tone is normal. Bulk is normal.  He has a great deal of difficulty with confrontational strength testing secondary to ongoing bladder spasms, but appears to be moving all 4 extremities grossly equally and reacts similarly to light noxious stimuli.  Slight drift of bilateral upper extremities on pronator drift testing Deep Tendon Reflexes: 2+ and symmetric in the biceps and patellae.  Plantars: Toes are briskly withdrawn bilaterally Cerebellar: Finger-nose is limited in performance secondary to pain   I have reviewed labs in epic and the results pertinent to this consultation are:  Basic Metabolic Panel: Recent Labs  Lab 05/27/20 1318  NA 138  K 3.3*  CL 98  CO2 13*  GLUCOSE 307*  BUN 17  CREATININE 1.13  CALCIUM 9.4  MG 1.4*    CBC: Recent Labs  Lab 05/27/20 1318  WBC 21.2*  NEUTROABS 14.5*  HGB 14.7  HCT 44.4  MCV 90.6  PLT 284    Coagulation Studies: No results for input(s): LABPROT, INR in the last 72 hours.    I have reviewed the images obtained:  As detailed in HPI above, head CT without acute intracranial process and recent MRI brain with left parietal lesions   Impression: This is a 80 year old man who presents with syncope versus seizure in the setting of extreme pain post urologic procedure with likely UTI and multiple days of poor sleep.  -Likely UTI -Sleep deprivation -Leukocytosis (reactive secondary to seizure versus infection) -Metabolic acidosis (lactic acidosis secondary to seizure, potential component of DKA/HHS) -Possible focal seizure with secondary generalization versus provoked generalized  tonic-clonic seizure in the setting of infection and sleep deprivation versus post syncopal convulsion in the setting of vasovagal syncope secondary to pain  At this time I have a low concern for CNS infection contributing given the obvious urinary source, but will continue to monitor  Recommendations: -Stat EEG to assess for potential epileptiform activity (epileptologist read pending) -MRI brain with and without contrast to confirm no new structural process -Trend CK to peak -UDS -Okay to proceed with cefepime but note that this medication does lower the seizure threshold and is significantly associated with encephalopathy, may repeat EEG if the patient's mental status worsens after initiation of this antibiotic -Appreciate pain control per primary team/ED, consider belladonna/opium suppositories -Appreciate primary team management of complicated UTI, sepsis, and blood glucose management -Hold on antiepileptics pending EEG and MRI, however  if the patient has recurrent events recommend 2 g of Keppra loading dose followed by 500 mg twice daily -Neurology will continue to follow  Dustin Crane [phone] [facility] coverage for [facility] is from 8 AM to 4 AM in-house and 4 PM to 8 PM by telephone/video. 8 PM to 8 AM emergent questions or overnight urgent questions should be addressed to Teleneurology On-call or [facility] neurohospitalist; contact information can be found on AMION

## 2020-05-27 NOTE — ED Notes (Signed)
Bladder scan 250

## 2020-05-27 NOTE — ED Notes (Signed)
MD. Issacs at bedside with pt wife

## 2020-05-27 NOTE — Progress Notes (Signed)
Notified bedside nurse of need to administer fluid bolus and give antibiotics.

## 2020-05-27 NOTE — ED Notes (Signed)
Wife at bedside.

## 2020-05-27 NOTE — Progress Notes (Signed)
Patient presented to clinic today with reports of ongoing penile pain, dribbling stream, and difficulty with self-catheterization. Triage vitals and bladder scan as below.  Patient had a syncopal versus seizure event in clinic prior to being seen and the rapid response team was called. An AED was placed, however patient did not lose a pulse and shock was not advised. Per patient's wife, he had one dose of oxybutynin 5mg  around 0930 this morning. He was transported to the ED for further evaluation.  Vitals:   05/27/20 1315  BP: (!) 136/96  Pulse: 72  SpO2: 98%   Results for orders placed or performed in visit on 05/27/20  BLADDER SCAN AMB NON-IMAGING  Result Value Ref Range   Scan Result 233 mL

## 2020-05-27 NOTE — Telephone Encounter (Signed)
Incoming call from wife who states that patient is in severe pain. She states that he is "screaming in pain". Pain is concentrated at the end of his penis and is made worse by urinating. Pt has been able to void, however stream is just a dribble. Wife states that she attempted to in and out cath the patient however met significant resistance and stopped. She notes that the abdomen is soft and not distended. She notes that there was some blood at the tip of the catheter when removed. Patient had one dose of 9m oxybutynin approximately and hour and half ago. Please advise.

## 2020-05-27 NOTE — H&P (Signed)
History and Physical    Dustin Crane EVO:[id number] DOB: [birth year] DOA: 05/27/2020  PCP: Idelle Crouch, MD   Patient coming from: Home  I have personally briefly reviewed patient's old medical records in [place]  Chief Complaint: Suprapubic pain                                Seizure  Most of the history was obtained from patient's wife at the bedside as patient is unable to provide any history  HPI: Dustin Crane is a 80 y.o. male with medical history significant for hypertension, diabetes mellitus, coronary artery disease status post stent angioplasty, GERD, peripheral vascular disease and status post recent UroLift procedure which was done about 8 days prior to this hospitalization. Per patient's wife he had the procedure 8 days ago and after the procedure was unable to void and so was discharged home with a Foley catheter.  He was seen back at the urology clinic for follow-up and the Foley catheter was removed but had to be reinserted within a couple of hours due to recurrent urinary retention.  His wife states that he has continued to have severe pain in the suprapubic area without any relief with opioid medications that were prescribed.  She also states that they have alternated between Tylenol and ibuprofen without any improvement in his pain. He was seen again at the urology clinic 1 day prior to his admission and his Foley catheter was removed.  The patient's wife he was able to pull to during the day but started having issues at night where he had to strain and apply pressure to his bladder to be able to void.  He also developed severe pain which she rated a 10 x 10 in intensity at its worst and patient had moaned and groaned all night long.  Patient has had poor sleep due to the severe pain and getting up frequently to use the bathroom. She states the patient also became confused over the last 24 hours and this has progressively worsened.  At baseline he is usually  awake, alert and able to carry out all activities of daily living. She took the patient back to the urologist office on the day of admission and while waiting to be seen a rapid response was called because patient had what appeared to be a "seizure".  The wife, who is a retired Marine scientist that he suddenly became unresponsive and had jerking movements involving his upper and lower extremities.  Patient was said to have had a pulse when the rapid response team arrived, the AED was placed but no shock was advised.  Patient was immediately rushed to the emergency room for further evaluation. At the time of my exam he is awake and alert but is only oriented to person.  He is unable to provide any history and continues to writhe around the bed in pain.  Serial bladder scans were done with about 250 cc of urine in his bladder. Patient's pain appears to get worse when he attempts to void and he received a dose of BNO suppository in the ER. I am unable to do a review of systems on this patient due to his mental status. VBG 7.42/39/45/25.3/81.7 Sodium 138, potassium 3.3, chloride 98, bicarb 13, glucose 307, BUN 17, creatinine 1.13, calcium 9.4, magnesium 1.4, alkaline phosphatase 85, albumin 4.2, AST 43, ALT 22, total protein 7.9, lactic acid 6.0 >> 3.0,  white count 21.2, hemoglobin 14.7, hematocrit 44, MCV 90.6, RDW 12.1, platelet count 284 CT renal stone study shows bilateral nephrolithiasis, without obstructive uropathy.Mild bladder wall thickening which could be related to prostatomegaly/outlet obstruction or cystitis. Status post urolift procedure. Gastric underdistention.  Cannot exclude concurrent gastritis.Coronary artery atherosclerosis. Aortic Atherosclerosis (ICD10-I70.0).Equivocal lesion within the inter/lower pole left kidney. Consider nonemergent renal ultrasound versus more complete characterization with pre and post-contrast renal mass protocol CT. Chest x-ray reviewed by me shows no acute  findings Twelve-lead EKG shows sinus tachycardia with PVCs    ED Course: Patient is a 80 year old male who was brought to the emergency room after an episode at the urologist office when patient suddenly became unresponsive and had jerking movements involving his upper and lower extremities suggestive of a possible seizure.  Patient is status post recent UroLift procedure and per his wife who provided most of the history has been in severe pain post procedure with recurrent episodes of urinary retention.  He appears to be septic from a urinary source and will be admitted to the hospital for further evaluation.  Review of Systems: As per HPI otherwise all other systems reviewed and negative.    Past Medical History:  Diagnosis Date  . Arthritis   . Cancer ([facility])    skin cancer with tags removed  . Coronary artery disease   . Diabetes mellitus without complication ([place])   . GERD (gastroesophageal reflux disease)   . Hyperlipemia   . Hypertension   . Myocardial infarction [facility]) 2004   2 stents  . Peripheral vascular disease ([place])   . TIA (transient ischemic attack) 10/2018   PATIENT DENIES THIS DIAGNOSIS  . Wears glasses   . Wears hearing aid    both ears    Past Surgical History:  Procedure Laterality Date  . ACHILLES TENDON SURGERY Right 12/07/2018   Procedure: ACHILLES TENDON REPAIR SECONDARY;  Surgeon: Albertine Patricia, DPM;  Location: [place];  Service: Podiatry;  Laterality: Right;  LMA LOCAL DIABETIC   block  . CALCANEAL OSTEOTOMY Right 12/07/2018   Procedure: PARTIAL CALCANECTOMY RIGHT;  Surgeon: Albertine Patricia, DPM;  Location: [place];  Service: Podiatry;  Laterality: Right;  . CARDIAC CATHETERIZATION  2005   stents x2  . CATARACT EXTRACTION W/PHACO Right 04/22/2020   Procedure: CATARACT EXTRACTION PHACO AND INTRAOCULAR LENS PLACEMENT ([facility]) RIGHT DIABETIC 3.65 00:41.9;  Surgeon: Birder Robson, MD;  Location: [place];  Service:  Ophthalmology;  Laterality: Right;  . CATARACT EXTRACTION W/PHACO Left 05/06/2020   Procedure: CATARACT EXTRACTION PHACO AND INTRAOCULAR LENS PLACEMENT ([place]) LEFT DIABETIC MYALUGIN;  Surgeon: Birder Robson, MD;  Location: [place];  Service: Ophthalmology;  Laterality: Left;  Dexycu 9%  LOT [id number]  EXP 10/05/2020 given @ 0757 inj OS  8.30 1:12.4  . COLONOSCOPY    . COLONOSCOPY WITH PROPOFOL N/A 04/03/2020   Procedure: COLONOSCOPY WITH PROPOFOL;  Surgeon: [name], MD;  Location: [facility] ENDOSCOPY;  Service: Gastroenterology;  Laterality: N/A;  . CYSTOSCOPY WITH INSERTION OF UROLIFT N/A 05/19/2020   Procedure: CYSTOSCOPY WITH INSERTION OF UROLIFT;  Surgeon: Hollice Espy, MD;  Location: [facility];  Service: Urology;  Laterality: N/A;  . FINGER ARTHROPLASTY Right 12/05/2012   Procedure: RIGHT INDEX FINGER IMPLANT ARTHROPLASTY;  Surgeon: Cammie Sickle., MD;  Location: [place];  Service: Orthopedics;  Laterality: Right;  . HERNIA REPAIR  1990   rt ing   . QUADRICEPS TENDON REPAIR Left 02/11/2017   Procedure: REPAIR QUADRICEP TENDON;  Surgeon: Leim Fabry, MD;  Location: [facility];  Service: Orthopedics;  Laterality: Left;  . SHOULDER ACROMIOPLASTY  1967   [place]   . SHOULDER ARTHROSCOPY  2012   right  . TONSILLECTOMY       reports that he quit smoking about 44 years ago. He has never used smokeless tobacco. He reports current alcohol use. He reports that he does not use drugs.  Allergies  Allergen Reactions  . Haemophilus Influenzae Anaphylaxis, Shortness Of Breath and Rash    Rash-severe n/v/d Ended up in hospital after taking!   . Hemophilus B Polysaccharide Vaccine Anaphylaxis, Shortness Of Breath and Rash    Rash-severe n/v/d   . Influenza Vac Split [Influenza Virus Vaccine] Shortness Of Breath and Other (See Comments)    Rash-severe n/v/d  . Hydroxyzine     Patient denies?????  . Compazine [Prochlorperazine Edisylate] Other (See  Comments)    Muscle tightness    Family History  Problem Relation Age of Onset  . Coronary artery disease Mother   . Diabetes Mother   . Alzheimer's disease Mother   . Heart disease Father   . Diabetes Father       Prior to Admission medications   Medication Sig Start Date End Date Taking? Authorizing Provider  aspirin 81 MG tablet Take 81 mg by mouth daily.   Yes [provider]  atorvastatin (LIPITOR) 40 MG tablet Take 40 mg by mouth in the morning and at bedtime.   Yes [provider]  cholecalciferol (VITAMIN D3) 25 MCG (1000 UNIT) tablet Take 1,000 Units by mouth daily.   Yes [provider]  ferrous sulfate 325 (65 FE) MG tablet Take 325 mg by mouth 3 (three) times a week.   Yes [provider]  glimepiride (AMARYL) 4 MG tablet Take 4 mg by mouth 2 (two) times daily. 08/05/19  Yes [provider]  HYDROcodone-acetaminophen (NORCO/VICODIN) 5-325 MG tablet Take 1-2 tablets by mouth every 6 (six) hours as needed for moderate pain. 05/19/20  Yes Hollice Espy, MD  metFORMIN (GLUCOPHAGE) 500 MG tablet Take 1,000 mg by mouth 2 (two) times daily with a meal.   Yes [provider]  Multiple Vitamins-Minerals (MULTIVITAMIN WITH MINERALS) tablet Take 1 tablet by mouth daily.   Yes [provider]  Omega-3 Fatty Acids (FISH OIL) 1200 MG CAPS Take 1,200 mg by mouth daily.   Yes [provider]  omeprazole (PRILOSEC) 20 MG capsule Take 20 mg by mouth 2 (two) times daily.   Yes [provider]  oxybutynin (DITROPAN) 5 MG tablet Take 1 tablet (5 mg total) by mouth every 8 (eight) hours as needed for bladder spasms. 05/26/20  Yes Vaillancourt, Samantha, PA-C  pioglitazone (ACTOS) 30 MG tablet Take 30 mg by mouth daily. 08/03/19  Yes [provider]  ramipril (ALTACE) 10 MG capsule Take 10 mg by mouth every evening.   Yes [provider]  sitaGLIPtin (JANUVIA) 100 MG tablet Take 100 mg by mouth daily.    Yes [provider]  tamsulosin (FLOMAX) 0.4 MG CAPS capsule Take 0.4 mg by mouth 2 (two) times daily. 08/06/19  Yes [provider]    Physical Exam: Vitals:   05/27/20 1419 05/27/20 1430 05/27/20 1700 05/27/20 1715  BP:  135/76 (!) 175/91 (!) 159/91  Pulse:  98 98 78  Resp:  16 16 17   Temp:      TempSrc:      SpO2:  96% 98% 92%  Weight: 81.2 kg     Height: 5\' 8"  (1.727 m)        Vitals:   05/27/20 1419 05/27/20 1430 05/27/20 1700 05/27/20 1715  BP:  135/76 (!) 175/91 (!) 159/91  Pulse:  98 98 78  Resp:  16 16 17   Temp:      TempSrc:      SpO2:  96% 98% 92%  Weight: 81.2 kg     Height: 5\' 8"  (1.727 m)         Constitutional: Alert and oriented x 1.  Only to person not to place or time.  Appears to be in severe painful distress. Patient writhing in pain.  HEENT:      Head: Normocephalic and atraumatic.         Eyes: PERLA, EOMI, Conjunctivae are normal. Sclera is non-icteric.       Mouth/Throat: Mucous membranes are dry.       Neck: Supple with no signs of meningismus. Cardiovascular:  Tachycardic. No murmurs, gallops, or rubs. 2+ symmetrical distal pulses are present . No JVD. No LE edema Respiratory: Respiratory effort normal .Lungs sounds clear bilaterally. No wheezes, crackles, or rhonchi.  Gastrointestinal: Soft, tenderness in the suprapubic area, and non distended with positive bowel sounds.  Genitourinary:  Condom cath in place Musculoskeletal: Nontender with normal range of motion in all extremities. No cyanosis, or erythema of extremities. Neurologic:  Face is symmetric. Moving all extremities. No gross focal neurologic deficits . Skin: Skin is warm, dry.  No rash or ulcers Psychiatric: Mood and affect are normal   Labs on Admission: I have personally reviewed following labs and imaging studies  CBC: Recent Labs  Lab 05/27/20 1318  WBC 21.2*  NEUTROABS 14.5*  HGB 14.7  HCT 44.4  MCV 90.6  PLT 194   Basic Metabolic Panel: Recent  Labs  Lab 05/27/20 1318  NA 138  K 3.3*  CL 98  CO2 13*  GLUCOSE 307*  BUN 17  CREATININE 1.13  CALCIUM 9.4  MG 1.4*   GFR: Estimated Creatinine Clearance: 53 mL/min (by C-G formula based on SCr of 1.13 mg/dL). Liver Function Tests: Recent Labs  Lab 05/27/20 1318  AST 43*  ALT 22  ALKPHOS 85  BILITOT 1.6*  PROT 7.9  ALBUMIN 4.2   No results for input(s): LIPASE, AMYLASE in the last 168 hours. No results for input(s): AMMONIA in the last 168 hours. Coagulation Profile: No results for input(s): INR, PROTIME in the last 168 hours. Cardiac Enzymes: Recent Labs  Lab 05/27/20 1642  CKTOTAL 270   BNP (last 3 results) No results for input(s): PROBNP in the last 8760 hours. HbA1C: No results for input(s): HGBA1C in the last 72 hours. CBG: Recent Labs  Lab 05/27/20 1313 05/27/20 1721 05/27/20 1832  GLUCAP 284* 271* 270*   Lipid Profile: No results for input(s): CHOL, HDL, LDLCALC, TRIG, CHOLHDL, LDLDIRECT in the last 72 hours. Thyroid Function Tests: No results for input(s): TSH, T4TOTAL, FREET4, T3FREE, THYROIDAB in the last 72 hours. Anemia Panel: No results for input(s): VITAMINB12, FOLATE, FERRITIN, TIBC, IRON, RETICCTPCT in the last 72 hours. Urine analysis:    Component Value Date/Time   COLORURINE YELLOW (A) 05/27/2020 1318   APPEARANCEUR HAZY (A) 05/27/2020 1318   APPEARANCEUR Hazy (A) 05/09/2020 1056   LABSPEC 1.010 05/27/2020 1318   PHURINE 6.0 05/27/2020 1318   GLUCOSEU 50 (A) 05/27/2020 1318   HGBUR LARGE (A) 05/27/2020 1318   BILIRUBINUR NEGATIVE 05/27/2020 1318   BILIRUBINUR Negative  05/09/2020 1056   KETONESUR 5 (A) 05/27/2020 1318   PROTEINUR 100 (A) 05/27/2020 1318   NITRITE POSITIVE (A) 05/27/2020 1318   LEUKOCYTESUR MODERATE (A) 05/27/2020 1318    Radiological Exams on Admission: CT Head Wo Contrast  Result Date: 05/27/2020 CLINICAL DATA:  Delirium, seizure-like activity EXAM: CT HEAD WITHOUT CONTRAST TECHNIQUE: Contiguous axial  images were obtained from the base of the skull through the vertex without intravenous contrast. COMPARISON:  MRI 05/13/2020 FINDINGS: Brain: Normal anatomic configuration. Parenchymal volume loss is commensurate with the patient's age. 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 No abnormal intra or extra-axial mass lesion or fluid collection. No abnormal mass effect or midline shift. No evidence of acute intracranial hemorrhage or infarct. Ventricular size is normal. Cerebellum unremarkable. Vascular: No asymmetric hyperdense vasculature at the skull base. Skull: Intact Sinuses/Orbits: There is dense opacification of the right frontal sinus with central hyperdensity suggesting chronic or fungal sinusitis again identified. Remaining paranasal sinuses are clear. Orbits are unremarkable. Other: Mastoid air cells and middle ear cavities are clear. IMPRESSION: No acute intracranial abnormality. Chronic right frontal sinus disease. Electronically Signed   By: Fidela Salisbury MD   On: 05/27/2020 14:09   DG Chest Portable 1 View  Result Date: 05/27/2020 CLINICAL DATA:  Altered mental status.  Seizures. EXAM: PORTABLE CHEST 1 VIEW COMPARISON:  11/09/2006 FINDINGS: Poor inspiration. Extensive overlying artifact. Heart size upper limits of normal. Aortic atherosclerotic calcification is present. Increased markings at the lung bases may simply relate to the poor inspiration. Cannot left a degree of atelectasis or infiltrate. Upper lungs appear clear. No evidence of edema or effusions. No acute bone finding. Chronic postsurgical and degenerative changes of the right shoulder region. IMPRESSION: Increased markings at the lung bases may simply relate to the poor inspiration. Basilar atelectasis or infiltrate not excluded. Electronically Signed   By: Nelson Chimes M.D.   On: 05/27/2020 14:38   CT Renal Stone Study  Result Date: 05/27/2020 CLINICAL DATA:  Dysuria.  Penile pain. EXAM: CT ABDOMEN AND  PELVIS WITHOUT CONTRAST TECHNIQUE: Multidetector CT imaging of the abdomen and pelvis was performed following the standard protocol without IV contrast. COMPARISON:  08/06/2009 abdominal ultrasound. No prior CT. FINDINGS: Lower chest: Bibasilar atelectasis. Mild degradation by arm position, motion inferiorly. Mild cardiomegaly with multivessel coronary artery atherosclerosis. Hepatobiliary: EKG wire and lead artifact as well. Grossly normal noncontrast appearance of the liver, gallbladder. No biliary duct dilatation. Pancreas: Normal, without mass or ductal dilatation. Spleen: Normal in size, without focal abnormality. Adrenals/Urinary Tract: Normal adrenal glands. Bilateral punctate renal collecting system calculi, most apparent on coronal reformats. No hydronephrosis. Possible lower pole left renal 2.1 cm lesion on 45/2. No hydroureter or ureteric calculi. No bladder calculi. Bladder wall thickening is mild. Stomach/Bowel: Apparent greater curvature gastric wall thickening at 2.7 cm on 26/2, likely at least partially artifactual in the setting of underdistention. Normal colon, appendix, and terminal ileum. Normal small bowel. Vascular/Lymphatic: Aortic atherosclerosis. No abdominopelvic adenopathy. Reproductive: Moderate prostatomegaly.  Urolift seeds identified. Other: No significant free fluid. Musculoskeletal: Pelvic sclerotic lesions including at up to 11 mm in the left iliac are most likely bone islands. Degenerate disc disease at the lumbosacral junction is advanced. IMPRESSION: 1. Multifactorial degradation as detailed above. 2. Bilateral nephrolithiasis, without obstructive uropathy. 3. Mild bladder wall thickening which could be related to prostatomegaly/outlet obstruction or cystitis. 4. Status post urolift procedure. 5. Gastric underdistention.  Cannot exclude concurrent gastritis. 6. Coronary artery atherosclerosis. Aortic Atherosclerosis (ICD10-I70.0). 7.  Equivocal lesion within the inter/lower pole  left kidney. Consider nonemergent renal ultrasound versus more complete characterization with pre and post-contrast renal mass protocol CT. MRI likely not optimal secondary to patient age and comorbidities. Electronically Signed   By: Abigail Miyamoto M.D.   On: 05/27/2020 15:28     Assessment/Plan Principal Problem:   Sepsis secondary to UTI [facility]) Active Problems:   Benign essential hypertension   CAD (coronary artery disease)   Diabetes mellitus type 2 with peripheral artery disease (H[facility])   Gastroesophageal reflux disease without esophagitis   Delirium   Seizure ([facility]CC)   Hypokalemia   Hypomagnesemia      Sepsis secondary to UTI As evidenced by tachycardia, marked leukocytosis, pyuria and elevated lactic acid level which shows a downward trend Patient is status post recent UroLift procedure We will place patient empirically on cefepime while awaiting results of urine and blood cultures Continue aggressive IV fluid resuscitation   Delirium Most likely secondary to pain and acute urinary tract infection Expect improvement in patient's mental status following resolution of infection and management of pain which is likely related to bladder spasms We will place patient on belladonna-opium suppositories as needed for bladder spasm    Diabetes mellitus with hyperglycemia We will keep patient n.p.o. for now until mental status improves Continue IV fluid hydration Blood sugar checks every 4 hours Hold oral hypoglycemic agent    ??  New onset seizure Rule out convulsive syncope Appreciate neurology input We will place patient on as needed Ativan for recurrent seizure Per neurology load patient with Keppra 2 g if patient has another witnessed seizure placed on Keppra 500 mg twice daily Follow-up results of EEG    Coronary artery disease Continue aspirin, statins and fish oil   History of hypertension Hold ramipril for now   Hypokalemia/hypomagnesemia Supplement potassium  and magnesium   DVT prophylaxis: Lovenox Code Status: full code Family Communication: Rater than 50% of time was spent discussing patient's condition and plan of care with his wife was at the bedside.  All questions and concerns have been addressed.  She verbalizes understanding and agrees with the plan. Disposition Plan: Back to previous home environment Consults called: Urology/neurology Status: At the time of admission, it appears that the appropriate admission status for this patient is inpatient.  This is judged to be reasonable and necessary in order to provide the required intensity of service to ensure the patient's safety given the presenting symptoms, physical exam findings and initial radiographic and laboratory data in the context of their comorbid conditions. Patient requires inpatient status due to high intensity of service, high risk for further deterioration and high frequency of surveillance required.    Collier Bullock MD [facility]     05/27/2020, 6:39 PM

## 2020-05-27 NOTE — Telephone Encounter (Signed)
Incoming call from wife, advised her to bring pt into the office for evaluation. Wife gave verbal understanding. Pt added to schedule.

## 2020-05-27 NOTE — Telephone Encounter (Signed)
Please bring him in today for a bladder scan.

## 2020-05-27 NOTE — Progress Notes (Signed)
Notified provider of need to order fluid bolus, pt needs 2436 cc, bedside RN aware.

## 2020-05-27 NOTE — Consult Note (Signed)
CODE SEPSIS - PHARMACY COMMUNICATION  **[facility] Antibiotics should be administered within 1 hour of Sepsis diagnosis**  Time Code Sepsis Called/Page Received: 1443  Antibiotics Ordered: 7218  Time of 1st antibiotic administration: 2883  Additional action taken by pharmacy: N/A  If necessary, Name of Provider/Nurse Contacted: N/A  Lorna Dibble ,PharmD Clinical Pharmacist  05/27/2020  2:52 PM

## 2020-05-27 NOTE — Consult Note (Signed)
PHARMACY -  BRIEF ANTIBIOTIC NOTE   Pharmacy has received consult(s) for cefepime from an ED provider.  The patient's profile has been reviewed for ht/wt/allergies/indication/available labs.    One time order(s) placed for: Cefepime 2g x1 in ED  Further antibiotics/pharmacy consults should be ordered by admitting physician if indicated.                       Thank you, Lorna Dibble 05/27/2020  2:50 PM

## 2020-05-27 NOTE — ED Notes (Signed)
Pt taken for EEG.  

## 2020-05-27 NOTE — Consult Note (Signed)
Pharmacy Antibiotic Note  Dustin Crane is a 80 y.o. male admitted on 05/27/2020 with UTI.  Pharmacy has been consulted for cefepime dosing.  Plan: Patient received cefepime 2 g IV x 1 in the ED Will order cefepime 2 g IV q12h  Monitor clinical picture and renal function F/U C&S, abx deescalation / LOT   Height: 5\' 8"  (172.7 cm) Weight: 81.2 kg (179 lb 0.2 oz) IBW/kg (Calculated) : 68.4  Temp (24hrs), Avg:98 F (36.7 C), Min:98 F (36.7 C), Max:98 F (36.7 C)  Recent Labs  Lab 05/27/20 1318 05/27/20 1642  WBC 21.2*  --   CREATININE 1.13  --   LATICACIDVEN 6.0* 3.0*    Estimated Creatinine Clearance: 53 mL/min (by C-G formula based on SCr of 1.13 mg/dL).    Allergies  Allergen Reactions  . Haemophilus Influenzae Anaphylaxis, Shortness Of Breath and Rash    Rash-severe n/v/d Ended up in hospital after taking!   . Hemophilus B Polysaccharide Vaccine Anaphylaxis, Shortness Of Breath and Rash    Rash-severe n/v/d   . Influenza Vac Split [Influenza Virus Vaccine] Shortness Of Breath and Other (See Comments)    Rash-severe n/v/d  . Hydroxyzine     Patient denies?????  . Compazine [Prochlorperazine Edisylate] Other (See Comments)    Muscle tightness    Antimicrobials this admission: 3/22 cefepime >>   Dose adjustments this admission:  Microbiology results: 3/22 BCx: pending 3/22 UCx: pending  Thank you for allowing pharmacy to be a part of this patient's care.  Darnelle Bos, PharmD 05/27/2020 6:30 PM

## 2020-05-27 NOTE — ED Provider Notes (Signed)
[facility] Emergency Department Provider Note  ____________________________________________   Event Date/Time   First MD Initiated Contact with Patient 05/27/20 1319     (approximate)  I have reviewed the triage vital signs and the nursing notes.   HISTORY  Chief Complaint No chief complaint on file.    HPI Dustin Crane is a 80 y.o. male here with syncopal episode from neuro clinic.  The patient reportedly recently had a urothelial sling procedure.  He has been having significant pain and urinary issues since then, including urinary retention and pain.  He has not been eating and drinking.  Is not been sleeping.  Has been confused.  He went to the clinic today for repeat evaluation after he has had multiple catheters placed for recurrent retention.  In the clinic, he reportedly went unresponsive.  Wife, who is a Marine scientist, states that he rolled his eyes back, tensed to the left then had a generalized tonic-clonic seizure.  He has been confused since then.  Rapid response was called.  Patient was postictal but breathing spontaneously on their arrival.  He has maintained a pulse.  History limited on arrival due to confusion.        Past Medical History:  Diagnosis Date  . Arthritis   . Cancer ([place])    skin cancer with tags removed  . Coronary artery disease   . Diabetes mellitus without complication ([place])   . GERD (gastroesophageal reflux disease)   . Hyperlipemia   . Hypertension   . Myocardial infarction [facility]) 2004   2 stents  . Peripheral vascular disease ([place])   . TIA (transient ischemic attack) 10/2018   PATIENT DENIES THIS DIAGNOSIS  . Wears glasses   . Wears hearing aid    both ears    Patient Active Problem List   Diagnosis Date Noted  . Sepsis ([place]) 05/27/2020  . Anemia, unspecified 11/27/2018  . CAD (coronary artery disease) 11/27/2018  . Complete rupture of rotator cuff 11/27/2018  . Disorder of bursae and tendons in shoulder region  11/27/2018  . Neck pain 11/27/2018  . Osteoarthritis 11/27/2018  . Sinoatrial node dysfunction ([place]) 11/27/2018  . Carotid stenosis 11/27/2018  . Other chest pain 10/26/2017  . Acute pain of left knee 04/21/2017  . Gastroesophageal reflux disease without esophagitis 05/06/2016  . Diabetes mellitus type 2 with peripheral artery disease ([place]) 11/03/2015  . Mild mitral insufficiency 04/09/2015  . 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 Benign essential hypertension 07/26/2014  . Mixed hyperlipidemia 08/09/2013    Past Surgical History:  Procedure Laterality Date  . ACHILLES TENDON SURGERY Right 12/07/2018   Procedure: ACHILLES TENDON REPAIR SECONDARY;  Surgeon: Albertine Patricia, DPM;  Location: [place];  Service: Podiatry;  Laterality: Right;  LMA LOCAL DIABETIC   block  . CALCANEAL OSTEOTOMY Right 12/07/2018   Procedure: PARTIAL CALCANECTOMY RIGHT;  Surgeon: Albertine Patricia, DPM;  Location: [place];  Service: Podiatry;  Laterality: Right;  . CARDIAC CATHETERIZATION  2005   stents x2  . CATARACT EXTRACTION W/PHACO Right 04/22/2020   Procedure: CATARACT EXTRACTION PHACO AND INTRAOCULAR LENS PLACEMENT (IOC) RIGHT DIABETIC 3.65 00:41.9;  Surgeon: Birder Robson, MD;  Location: [place];  Service: Ophthalmology;  Laterality: Right;  . CATARACT EXTRACTION W/PHACO Left 05/06/2020   Procedure: CATARACT EXTRACTION PHACO AND INTRAOCULAR LENS PLACEMENT ([place]) LEFT DIABETIC MYALUGIN;  Surgeon: Birder Robson, MD;  Location: [place];  Service: Ophthalmology;  Laterality: Left;  Dexycu 9%  LOT [id number]  EXP  10/05/2020 given @ 0757 inj OS  8.30 1:12.4  . COLONOSCOPY    . COLONOSCOPY WITH PROPOFOL N/A 04/03/2020   Procedure: COLONOSCOPY WITH PROPOFOL;  Surgeon: [name], MD;  Location: [facility] ENDOSCOPY;  Service: Gastroenterology;  Laterality: N/A;  . CYSTOSCOPY WITH INSERTION OF UROLIFT N/A 05/19/2020   Procedure: CYSTOSCOPY WITH INSERTION OF UROLIFT;  Surgeon:  Hollice Espy, MD;  Location: [facility];  Service: Urology;  Laterality: N/A;  . FINGER ARTHROPLASTY Right 12/05/2012   Procedure: RIGHT INDEX FINGER IMPLANT ARTHROPLASTY;  Surgeon: Cammie Sickle., MD;  Location: [place];  Service: Orthopedics;  Laterality: Right;  . HERNIA REPAIR  1990   rt ing   . QUADRICEPS TENDON REPAIR Left 02/11/2017   Procedure: REPAIR QUADRICEP TENDON;  Surgeon: Leim Fabry, MD;  Location: [facility];  Service: Orthopedics;  Laterality: Left;  . SHOULDER ACROMIOPLASTY  1967   [place]   . SHOULDER ARTHROSCOPY  2012   right  . TONSILLECTOMY      Prior to Admission medications   Medication Sig Start Date End Date Taking? Authorizing Provider  aspirin 81 MG tablet Take 81 mg by mouth daily.   Yes [provider]  atorvastatin (LIPITOR) 40 MG tablet Take 40 mg by mouth in the morning and at bedtime.   Yes [provider]  cholecalciferol (VITAMIN D3) 25 MCG (1000 UNIT) tablet Take 1,000 Units by mouth daily.   Yes [provider]  ferrous sulfate 325 (65 FE) MG tablet Take 325 mg by mouth 3 (three) times a week.   Yes [provider]  glimepiride (AMARYL) 4 MG tablet Take 4 mg by mouth 2 (two) times daily. 08/05/19  Yes [provider]  HYDROcodone-acetaminophen (NORCO/VICODIN) 5-325 MG tablet Take 1-2 tablets by mouth every 6 (six) hours as needed for moderate pain. 05/19/20  Yes Hollice Espy, MD  metFORMIN (GLUCOPHAGE) 500 MG tablet Take 1,000 mg by mouth 2 (two) times daily with a meal.   Yes [provider]  Multiple Vitamins-Minerals (MULTIVITAMIN WITH MINERALS) tablet Take 1 tablet by mouth daily.   Yes [provider]  Omega-3 Fatty Acids (FISH OIL) 1200 MG CAPS Take 1,200 mg by mouth daily.   Yes [provider]  omeprazole (PRILOSEC) 20 MG capsule Take 20 mg by mouth 2 (two) times daily.   Yes [provider]  oxybutynin (DITROPAN) 5 MG tablet Take  1 tablet (5 mg total) by mouth every 8 (eight) hours as needed for bladder spasms. 05/26/20  Yes Vaillancourt, Samantha, PA-C  pioglitazone (ACTOS) 30 MG tablet Take 30 mg by mouth daily. 08/03/19  Yes [provider]  ramipril (ALTACE) 10 MG capsule Take 10 mg by mouth every evening.   Yes [provider]  sitaGLIPtin (JANUVIA) 100 MG tablet Take 100 mg by mouth daily.   Yes [provider]  tamsulosin (FLOMAX) 0.4 MG CAPS capsule Take 0.4 mg by mouth 2 (two) times daily. 08/06/19  Yes [provider]    Allergies Haemophilus influenzae, Hemophilus b polysaccharide vaccine, Influenza vac split [influenza virus vaccine], Hydroxyzine, and Compazine [prochlorperazine edisylate]  Family History  Problem Relation Age of Onset  . Coronary artery disease Mother   . Diabetes Mother   . Alzheimer's disease Mother   . Heart disease Father   . Diabetes Father     Social History Social History   Tobacco Use  . Smoking status: Former Smoker    Quit date: 1978  Years since quitting: 44.2  . Smokeless tobacco: Never Used  Vaping Use  . Vaping Use: Never used  Substance Use Topics  . Alcohol use: Yes    Comment: occ  . Drug use: No    Review of Systems  Review of Systems  Constitutional: Positive for fatigue. Negative for chills and fever.  HENT: Negative for sore throat.   Respiratory: Negative for shortness of breath.   Cardiovascular: Negative for chest pain.  Gastrointestinal: Positive for nausea. Negative for abdominal pain.  Genitourinary: Positive for difficulty urinating and dysuria. Negative for flank pain.  Musculoskeletal: Negative for neck pain.  Skin: Negative for rash and wound.  Allergic/Immunologic: Negative for immunocompromised state.  Neurological: Positive for seizures and weakness. Negative for numbness.  Hematological: Does not bruise/bleed easily.  All other systems reviewed and are negative.     ____________________________________________  PHYSICAL EXAM:      VITAL SIGNS: ED Triage Vitals [05/27/20 1318]  Enc Vitals Group     BP (!) 150/90     Pulse Rate (!) 112     Resp 18     Temp      Temp src      SpO2 100 %     Weight      Height      Head Circumference      Peak Flow      Pain Score      Pain Loc      Pain Edu?      Excl. in [place]?      Physical Exam Vitals and nursing note reviewed.  Constitutional:      General: He is not in acute distress.    Appearance: He is well-developed.  HENT:     Head: Normocephalic and atraumatic.     Mouth/Throat:     Mouth: Mucous membranes are dry.  Eyes:     Conjunctiva/sclera: Conjunctivae normal.  Cardiovascular:     Rate and Rhythm: Regular rhythm. Tachycardia present.     Heart sounds: Normal heart sounds. No murmur heard. No friction rub.  Pulmonary:     Effort: Pulmonary effort is normal. No respiratory distress.     Breath sounds: Normal breath sounds. No wheezing or rales.  Abdominal:     General: There is no distension.     Palpations: Abdomen is soft.     Tenderness: There is no abdominal tenderness.  Musculoskeletal:     Cervical back: Neck supple.  Skin:    General: Skin is warm.     Capillary Refill: Capillary refill takes less than 2 seconds.  Neurological:     Mental Status: He is alert and oriented to person, place, and time.     GCS: GCS eye subscore is 4. GCS verbal subscore is 3. GCS motor subscore is 4.     Motor: No abnormal muscle tone.       ____________________________________________   LABS (all labs ordered are listed, but only abnormal results are displayed)  Labs Reviewed  CBC WITH DIFFERENTIAL/PLATELET - Abnormal; Notable for the following components:      Result Value   WBC 21.2 (*)    Neutro Abs 14.5 (*)    Lymphs Abs 4.8 (*)    Monocytes Absolute 1.6 (*)    Abs Immature Granulocytes 0.13 (*)    All other components within normal limits  COMPREHENSIVE METABOLIC  PANEL - Abnormal; Notable for the following components:   Potassium 3.3 (*)    CO2 13 (*)  Glucose, Bld 307 (*)    AST 43 (*)    Total Bilirubin 1.6 (*)    Anion gap 27 (*)    All other components within normal limits  LACTIC ACID, PLASMA - Abnormal; Notable for the following components:   Lactic Acid, Venous 6.0 (*)    All other components within normal limits  MAGNESIUM - Abnormal; Notable for the following components:   Magnesium 1.4 (*)    All other components within normal limits  URINALYSIS, COMPLETE (UACMP) WITH MICROSCOPIC - Abnormal; Notable for the following components:   Color, Urine YELLOW (*)    APPearance HAZY (*)    Glucose, UA 50 (*)    Hgb urine dipstick LARGE (*)    Ketones, ur 5 (*)    Protein, ur 100 (*)    Nitrite POSITIVE (*)    Leukocytes,Ua MODERATE (*)    RBC / HPF >50 (*)    WBC, UA >50 (*)    Bacteria, UA MANY (*)    Non Squamous Epithelial PRESENT (*)    All other components within normal limits  BLOOD GAS, VENOUS - Abnormal; Notable for the following components:   pCO2, Ven 39 (*)    All other components within normal limits  BETA-HYDROXYBUTYRIC ACID - Abnormal; Notable for the following components:   Beta-Hydroxybutyric Acid 0.52 (*)    All other components within normal limits  CBG MONITORING, ED - Abnormal; Notable for the following components:   Glucose-Capillary 284 (*)    All other components within normal limits  TROPONIN I (HIGH SENSITIVITY) - Abnormal; Notable for the following components:   Troponin I (High Sensitivity) 21 (*)    All other components within normal limits  URINE CULTURE  CULTURE, BLOOD (ROUTINE X 2)  CULTURE, BLOOD (ROUTINE X 2)  RESP PANEL BY RT-PCR (FLU A&B, COVID) ARPGX2  LACTIC ACID, PLASMA  CBG MONITORING, ED  TROPONIN I (HIGH SENSITIVITY)    ____________________________________________  EKG: Sinus tachycardia, ventricular rate 110.  QRS 91, QTc 459.  Paired PVCs.  Abnormal R wave progression no acute ST  elevations. ________________________________________  RADIOLOGY All imaging, including plain films, CT scans, and ultrasounds, independently reviewed by me, and interpretations confirmed via formal radiology reads.  ED MD interpretation:   CT head: No acute intracranial abnormality Chest x-ray: Increased marking in the lung bases  Official radiology report(s): CT Head Wo Contrast  Result Date: 05/27/2020 CLINICAL DATA:  Delirium, seizure-like activity EXAM: CT HEAD WITHOUT CONTRAST TECHNIQUE: Contiguous axial images were obtained from the base of the skull through the vertex without intravenous contrast. COMPARISON:  MRI 05/13/2020 FINDINGS: Brain: Normal anatomic configuration. Parenchymal volume loss is commensurate with the patient's age. 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 No abnormal intra or extra-axial mass lesion or fluid collection. No abnormal mass effect or midline shift. No evidence of acute intracranial hemorrhage or infarct. Ventricular size is normal. Cerebellum unremarkable. Vascular: No asymmetric hyperdense vasculature at the skull base. Skull: Intact Sinuses/Orbits: There is dense opacification of the right frontal sinus with central hyperdensity suggesting chronic or fungal sinusitis again identified. Remaining paranasal sinuses are clear. Orbits are unremarkable. Other: Mastoid air cells and middle ear cavities are clear. IMPRESSION: No acute intracranial abnormality. Chronic right frontal sinus disease. Electronically Signed   By: Fidela Salisbury MD   On: 05/27/2020 14:09   DG Chest Portable 1 View  Result Date: 05/27/2020 CLINICAL DATA:  Altered mental status.  Seizures. EXAM: PORTABLE CHEST 1 VIEW COMPARISON:  11/09/2006 FINDINGS: Poor inspiration. Extensive overlying artifact. Heart size upper limits of normal. Aortic atherosclerotic calcification is present. Increased markings at the lung bases may simply relate to the poor inspiration.  Cannot left a degree of atelectasis or infiltrate. Upper lungs appear clear. No evidence of edema or effusions. No acute bone finding. Chronic postsurgical and degenerative changes of the right shoulder region. IMPRESSION: Increased markings at the lung bases may simply relate to the poor inspiration. Basilar atelectasis or infiltrate not excluded. Electronically Signed   By: Nelson Chimes M.D.   On: 05/27/2020 14:38   CT Renal Stone Study  Result Date: 05/27/2020 CLINICAL DATA:  Dysuria.  Penile pain. EXAM: CT ABDOMEN AND PELVIS WITHOUT CONTRAST TECHNIQUE: Multidetector CT imaging of the abdomen and pelvis was performed following the standard protocol without IV contrast. COMPARISON:  08/06/2009 abdominal ultrasound. No prior CT. FINDINGS: Lower chest: Bibasilar atelectasis. Mild degradation by arm position, motion inferiorly. Mild cardiomegaly with multivessel coronary artery atherosclerosis. Hepatobiliary: EKG wire and lead artifact as well. Grossly normal noncontrast appearance of the liver, gallbladder. No biliary duct dilatation. Pancreas: Normal, without mass or ductal dilatation. Spleen: Normal in size, without focal abnormality. Adrenals/Urinary Tract: Normal adrenal glands. Bilateral punctate renal collecting system calculi, most apparent on coronal reformats. No hydronephrosis. Possible lower pole left renal 2.1 cm lesion on 45/2. No hydroureter or ureteric calculi. No bladder calculi. Bladder wall thickening is mild. Stomach/Bowel: Apparent greater curvature gastric wall thickening at 2.7 cm on 26/2, likely at least partially artifactual in the setting of underdistention. Normal colon, appendix, and terminal ileum. Normal small bowel. Vascular/Lymphatic: Aortic atherosclerosis. No abdominopelvic adenopathy. Reproductive: Moderate prostatomegaly.  Urolift seeds identified. Other: No significant free fluid. Musculoskeletal: Pelvic sclerotic lesions including at up to 11 mm in the left iliac are most  likely bone islands. Degenerate disc disease at the lumbosacral junction is advanced. IMPRESSION: 1. Multifactorial degradation as detailed above. 2. Bilateral nephrolithiasis, without obstructive uropathy. 3. Mild bladder wall thickening which could be related to prostatomegaly/outlet obstruction or cystitis. 4. Status post urolift procedure. 5. Gastric underdistention.  Cannot exclude concurrent gastritis. 6. Coronary artery atherosclerosis. Aortic Atherosclerosis (ICD10-I70.0). 7. Equivocal lesion within the inter/lower pole left kidney. Consider nonemergent renal ultrasound versus more complete characterization with pre and post-contrast renal mass protocol CT. MRI likely not optimal secondary to patient age and comorbidities. Electronically Signed   By: Abigail Miyamoto M.D.   On: 05/27/2020 15:28    ____________________________________________  PROCEDURES   Procedure(s) performed (including Critical Care):  .Critical Care Performed by: Duffy Bruce, MD Authorized by: Duffy Bruce, MD   Critical care provider statement:    Critical care time (minutes):  35   Critical care time was exclusive of:  Separately billable procedures and treating other patients and teaching time   Critical care was necessary to treat or prevent imminent or life-threatening deterioration of the following conditions:  Circulatory failure, respiratory failure, cardiac failure and sepsis   Critical care was time spent personally by me on the following activities:  Development of treatment plan with patient or surrogate, discussions with consultants, evaluation of patient's response to treatment, examination of patient, obtaining history from patient or surrogate, ordering and performing treatments and interventions, ordering and review of laboratory studies, ordering and review of radiographic studies, pulse oximetry, re-evaluation of patient's condition and review of old charts   I assumed direction of critical care  for this patient from another provider in my specialty: no      ____________________________________________  INITIAL  IMPRESSION / MDM / ASSESSMENT AND PLAN / ED COURSE  As part of my medical decision making, I reviewed the following data within the [place] notes reviewed and incorporated, Old chart reviewed, Notes from prior ED visits, and [place] Controlled Substance [place] was evaluated in Emergency Department on 05/27/2020 for the symptoms described in the history of present illness. He was evaluated in the context of the global COVID-19 pandemic, which necessitated consideration that the patient might be at risk for infection with the SARS-CoV-2 virus that causes COVID-19. Institutional protocols and algorithms that pertain to the evaluation of patients at risk for COVID-19 are in a state of rapid change based on information released by regulatory bodies including the CDC and federal and state organizations. These policies and algorithms were followed during the patient's care in the ED.  Some ED evaluations and interventions may be delayed as a result of limited staffing during the pandemic.*      Medical Decision Making: 80 year old male here with altered mental status after syncopal event versus seizure in the urology clinic.  On arrival, the patient is encephalopathic but without focal deficit.  Stat CT head reviewed and shows no acute abnormality.  Chest x-ray largely unremarkable.  Lab work shows marked leukocytosis, lactic acidosis, bicarb of 13.  UA is consistent with UTI and he has had severe urinary symptoms since his procedure.  His white count also shows left shift consistent with acute infection.  He does have hyperglycemia, but I suspect his anion gap is more so due to dehydration, lactic acidosis with lactate of 6, and beta hydroxybutyric acid is only minimally elevated.  Do not suspect significant DKA.  He has only 5 ketones in his  urine.  pH is normal.  CT stone obtained, shows no evidence of significant obstruction.  Suspect acute loss of consciousness, likely due to provoked seizure in the setting of sepsis, pain, dehydration, but convulsive syncope is also a consideration.  I discussed the case with neurology, who will obtain an EEG.  Of note, cefepime does lower the seizure threshold, but was given initially due to recent uroprocedures with recurrent and severe UTI.  Otherwise, electrolytes replaced.  IV fluids given for sepsis protocol.  Admit to stepdown. ____________________________________________  FINAL CLINICAL IMPRESSION(S) / ED DIAGNOSES  Final diagnoses:  Sepsis secondary to UTI ([place])  Syncope, unspecified syncope type  Acidemia  Lactic acidosis  Encephalopathy     MEDICATIONS GIVEN DURING THIS VISIT:  Medications  phenazopyridine (PYRIDIUM) tablet 200 mg (has no administration in time range)  magnesium sulfate IVPB 2 g 50 mL (has no administration in time range)  sodium chloride 0.9 % bolus 500 mL (500 mLs Intravenous New Bag/Given 05/27/20 1408)  sodium chloride 0.9 % bolus 1,000 mL (1,000 mLs Intravenous New Bag/Given 05/27/20 1513)  fentaNYL (SUBLIMAZE) injection 25 mcg (25 mcg Intravenous Given 05/27/20 1522)  ceFEPIme (MAXIPIME) 2 g in sodium chloride 0.9 % 100 mL IVPB (0 g Intravenous Stopped 05/27/20 1549)  sodium chloride 0.9 % bolus 1,000 mL (0 mLs Intravenous Stopped 05/27/20 1549)     ED Discharge Orders    None       Note:  This document was prepared using Dragon voice recognition software and may include unintentional dictation errors.   Duffy Bruce, MD 05/27/20 [phone]

## 2020-05-27 NOTE — ED Notes (Signed)
Critical   Lactic Acid 6.0   MD. Isscac advised

## 2020-05-27 NOTE — Progress Notes (Signed)
Responded to RRT call. On arrival, patient had spontaneous respirations and pulse. Pt was placed on stretcher and taken to ED By RT, surgical tech and myself. AED had been applied, advised no shock. Pt given supplemental oxygen for transport. Had called ED charge RN and need for bed on arrival to ED. Pt immediately roomed on arrival

## 2020-05-28 ENCOUNTER — Ambulatory Visit: Payer: Medicare Other | Admitting: Urology

## 2020-05-28 ENCOUNTER — Ambulatory Visit: Payer: Self-pay | Admitting: Urology

## 2020-05-28 DIAGNOSIS — D72828 Other elevated white blood cell count: Secondary | ICD-10-CM

## 2020-05-28 LAB — BASIC METABOLIC PANEL
Anion gap: 8 (ref 5–15)
BUN: 11 mg/dL (ref 8–23)
CO2: 24 mmol/L (ref 22–32)
Calcium: 8.2 mg/dL — ABNORMAL LOW (ref 8.9–10.3)
Chloride: 106 mmol/L (ref 98–111)
Creatinine, Ser: 0.75 mg/dL (ref 0.61–1.24)
GFR, Estimated: >60 mL/min (ref >60)
Glucose, Bld: 211 mg/dL — ABNORMAL HIGH (ref 70–99)
Potassium: 3.5 mmol/L (ref 3.5–5.1)
Sodium: 138 mmol/L (ref 135–145)

## 2020-05-28 LAB — CBC
HCT: 36.4 % — ABNORMAL LOW (ref 39.0–52.0)
Hemoglobin: 12.7 g/dL — ABNORMAL LOW (ref 13.0–17.0)
MCH: 30.4 pg (ref 26.0–34.0)
MCHC: 34.9 g/dL (ref 30.0–36.0)
MCV: 87.1 fL (ref 80.0–100.0)
Platelets: 201 10*3/uL (ref 150–400)
RBC: 4.18 MIL/uL — ABNORMAL LOW (ref 4.22–5.81)
RDW: 12 % (ref 11.5–15.5)
WBC: 12.2 10*3/uL — ABNORMAL HIGH (ref 4.0–10.5)
nRBC: 0 % (ref 0.0–0.2)

## 2020-05-28 LAB — GLUCOSE, CAPILLARY
Glucose-Capillary: 175 mg/dL — ABNORMAL HIGH (ref 70–99)
Glucose-Capillary: 343 mg/dL — ABNORMAL HIGH (ref 70–99)
Glucose-Capillary: 118 mg/dL — ABNORMAL HIGH (ref 70–99)
Glucose-Capillary: 259 mg/dL — ABNORMAL HIGH (ref 70–99)

## 2020-05-28 LAB — PROTIME-INR
INR: 1.1 (ref 0.8–1.2)
Prothrombin Time: 13.7 seconds (ref 11.4–15.2)

## 2020-05-28 LAB — CORTISOL-AM, BLOOD: Cortisol - AM: 18.4 ug/dL (ref 6.7–22.6)

## 2020-05-28 LAB — CK: Total CK: 377 U/L (ref 49–397)

## 2020-05-28 LAB — PROCALCITONIN: Procalcitonin: 0.12 ng/mL

## 2020-05-28 MED ORDER — POLYETHYLENE GLYCOL 3350 17 G PO PACK
17.0000 g | PACK | Freq: Every day | ORAL | Status: DC
  Filled 2020-05-28 (×2): qty 1

## 2020-05-28 MED ORDER — INSULIN ASPART 100 UNIT/ML ~~LOC~~ SOLN: 0.0000 [IU] | Freq: Three times a day (TID) | SUBCUTANEOUS | Status: DC

## 2020-05-28 MED ORDER — TRAZODONE HCL 50 MG PO TABS
50.0000 mg | ORAL_TABLET | Freq: Every evening | ORAL | Status: DC | PRN
  Administered 2020-05-28: 50 mg via ORAL
  Filled 2020-05-28 (×2): qty 1

## 2020-05-28 MED ORDER — INSULIN GLARGINE 100 UNIT/ML ~~LOC~~ SOLN
15.0000 [IU] | Freq: Every day | SUBCUTANEOUS | Status: DC
  Administered 2020-05-28 – 2020-05-30 (×3): 15 [IU] via SUBCUTANEOUS
  Filled 2020-05-28 (×3): qty 0.15

## 2020-05-28 MED ORDER — OXYCODONE-ACETAMINOPHEN 7.5-325 MG PO TABS
1.0000 | ORAL_TABLET | ORAL | Status: DC | PRN
  Administered 2020-05-28 – 2020-05-29 (×5): 1 via ORAL
  Filled 2020-05-28 (×5): qty 1

## 2020-05-28 MED ORDER — HALOPERIDOL LACTATE 5 MG/ML IJ SOLN
2.5000 mg | Freq: Once | INTRAMUSCULAR | Status: AC
  Administered 2020-05-28: 2.5 mg via INTRAVENOUS
  Filled 2020-05-28: qty 1

## 2020-05-28 MED ORDER — INSULIN ASPART 100 UNIT/ML ~~LOC~~ SOLN
0.0000 [IU] | Freq: Three times a day (TID) | SUBCUTANEOUS | Status: DC
  Administered 2020-05-29 (×2): 2 [IU] via SUBCUTANEOUS
  Administered 2020-05-29 – 2020-05-30 (×2): 5 [IU] via SUBCUTANEOUS
  Administered 2020-05-30: 2 [IU] via SUBCUTANEOUS
  Filled 2020-05-28 (×5): qty 1

## 2020-05-28 MED ORDER — INSULIN ASPART 100 UNIT/ML ~~LOC~~ SOLN
0.0000 [IU] | Freq: Every day | SUBCUTANEOUS | Status: DC
  Administered 2020-05-28: 3 [IU] via SUBCUTANEOUS
  Administered 2020-05-29: 2 [IU] via SUBCUTANEOUS
  Filled 2020-05-28 (×2): qty 1

## 2020-05-28 MED ORDER — SODIUM CHLORIDE 0.9 % IV SOLN
2.0000 g | Freq: Three times a day (TID) | INTRAVENOUS | Status: DC
  Administered 2020-05-28 – 2020-05-30 (×6): 2 g via INTRAVENOUS
  Filled 2020-05-28 (×10): qty 2

## 2020-05-28 MED ORDER — INSULIN ASPART 100 UNIT/ML ~~LOC~~ SOLN
3.0000 [IU] | Freq: Three times a day (TID) | SUBCUTANEOUS | Status: DC
  Administered 2020-05-29 – 2020-05-30 (×5): 3 [IU] via SUBCUTANEOUS
  Filled 2020-05-28 (×5): qty 1

## 2020-05-28 MED ORDER — DOCUSATE SODIUM 100 MG PO CAPS
200.0000 mg | ORAL_CAPSULE | Freq: Two times a day (BID) | ORAL | Status: DC
  Administered 2020-05-28 – 2020-05-30 (×4): 200 mg via ORAL
  Filled 2020-05-28 (×4): qty 2

## 2020-05-28 MED ORDER — INSULIN ASPART 100 UNIT/ML ~~LOC~~ SOLN
0.0000 [IU] | Freq: Three times a day (TID) | SUBCUTANEOUS | Status: DC
  Administered 2020-05-28: 7 [IU] via SUBCUTANEOUS
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
## 2020-05-28 NOTE — Procedures (Signed)
Date of Study: 05/27/20  Reason for Study: Pt is a 24 YOM with PMH of HTN, HLD, DM II, CAD, GERD, PVD who presented unresponsive and had jerking movements involving his upper and lower extremities suggestive of a possible seizure activity. Eval for seizures.  Description of recording:  This recording was obtained using a Digital EEG system with 18 channel capacity . Standard bipolar and referential EEG montages were used following the International  10 - 20  System.   This is a digitally recorded EEG with duration of approximately 25 minutes. The background consists of 7-8 hz alpha rhythm which attenuates with eye opening and closure.  There were no epileptiform discharges, focal slowing, or recorded electrographic seizures.  Hyperventilation was not performed. Photic stimulation was performed and it did not elicit any electrographic seizures. Stage II sleep did not occur in this recording.  EKG shows normal sinus rhythm throughout the study.  IMPRESSION: This is a normal EEG for age. There were no clinical seizures or epileptiform discharges noted during study.  CLINICAL CORRELATION: This EEG finding did not support the diagnosis of seizure. Clinical correlation is advised. A normal EEG does not rule out epilepsy, which is a clinical diagnosis.  I have personally reviewed this study.

## 2020-05-28 NOTE — Progress Notes (Addendum)
PROGRESS NOTE    Dustin Crane  SFK:[id number] DOB: Mar 17, 1940 DOA: 05/27/2020 PCP: Idelle Crouch, MD   Assessment & Plan:   Principal Problem:   Sepsis secondary to UTI [facility]) Active Problems:   Benign essential hypertension   CAD (coronary artery disease)   Diabetes mellitus type 2 with peripheral artery disease ([place])   Gastroesophageal reflux disease without esophagitis   Delirium   Seizure ([place])   Hypokalemia   Hypomagnesemia     Sepsis secondary to UTI: meets criteria w/ tachycardia, leukocytosis, elevated lactic acid & UTI. S/p recent urolift procedure. Continue on IV cefepime. Continue on IVFs. Morphine, percocet prn for pain. Urine and blood cxs are pending   Possible UTI: urine cx is pending. Continue on IV cefepime   Leukocytosis: likely secondary to infection. Continue on IV abxs   Delirium: likely secondary to UTI. Re-orient prn  DM2: likely poorly controlled. Continue on lantus, SSI w/ accuchecks. Continue to hold home dose of metformin   Possible new onset seizure: s/p keppra given as per neuro. EEG pending. Neuro following and recs apprec  CAD: stable. Continue on aspirin, statin   HTN: continue to hold ACE-I  Hypokalemia: WNL today   DVT prophylaxis: lovenox  Code Status: full  Family Communication: discussed pt's care w/ pt's wife at bedside and answered her questions Disposition Plan: depends on PT/OT recs   Level of care: Stepdown   Status is: Inpatient  Remains inpatient appropriate because:IV treatments appropriate due to intensity of illness or inability to take PO and Inpatient level of care appropriate due to severity of illness   Dispo: The patient is from: Home              Anticipated d/c is to: Home              Patient currently is not medically stable to d/c.   Difficult to place patient No        Consultants:   Urology  Neuro    Procedures:    Antimicrobials:    Subjective: Pt c/o bladder pain  intermittently   Objective: Vitals:   05/28/20 0500 05/28/20 0600 05/28/20 0700 05/28/20 0715  BP: (!) 161/83 (!) 145/76 (!) 156/85   Pulse: 87 83 83 94  Resp: 15 14 15 18   Temp:    97.8 F (36.6 C)  TempSrc:    Oral  SpO2: 98% 97% 96% 100%  Weight:      Height:        Intake/Output Summary (Last 24 hours) at 05/28/2020 0740 Last data filed at 05/28/2020 0700 Gross per 24 hour  Intake 1424.44 ml  Output 1530 ml  Net -105.56 ml   Filed Weights   05/27/20 1419  Weight: 81.2 kg    Examination:  General exam: Appears calm and comfortable  Respiratory system: Clear to auscultation. Respiratory effort normal. Cardiovascular system: S1 & S2 +. No  rubs, gallops or clicks.  Gastrointestinal system: Abdomen is nondistended, soft and suprapubic tenderness. Hypoactive bowel sounds  Central nervous system: Alert and oriented. Moves all extremities  Psychiatry: Judgement and insight appear normal. Flat mood and affect   Data Reviewed: I have personally reviewed following labs and imaging studies  CBC: Recent Labs  Lab 05/27/20 1318 05/28/20 0435  WBC 21.2* 12.2*  NEUTROABS 14.5*  --   HGB 14.7 12.7*  HCT 44.4 36.4*  MCV 90.6 87.1  PLT 284 174   Basic Metabolic Panel: Recent Labs  Lab 05/27/20 1318 05/28/20  0435  NA 138 138  K 3.3* 3.5  CL 98 106  CO2 13* 24  GLUCOSE 307* 211*  BUN 17 11  CREATININE 1.13 0.75  CALCIUM 9.4 8.2*  MG 1.4*  --    GFR: Estimated Creatinine Clearance: 74.8 mL/min (by C-G formula based on SCr of 0.75 mg/dL). Liver Function Tests: Recent Labs  Lab 05/27/20 1318  AST 43*  ALT 22  ALKPHOS 85  BILITOT 1.6*  PROT 7.9  ALBUMIN 4.2   No results for input(s): LIPASE, AMYLASE in the last 168 hours. No results for input(s): AMMONIA in the last 168 hours. Coagulation Profile: Recent Labs  Lab 05/28/20 0435  INR 1.1   Cardiac Enzymes: Recent Labs  Lab 05/27/20 1642 05/28/20 0435  CKTOTAL 270 377   BNP (last 3 results) No  results for input(s): PROBNP in the last 8760 hours. HbA1C: Recent Labs    05/27/20 1843  HGBA1C 8.1*   CBG: Recent Labs  Lab 05/27/20 1721 05/27/20 1832 05/27/20 2106 05/27/20 2338 05/28/20 0336  GLUCAP 271* 270* 233* 183* 175*   Lipid Profile: No results for input(s): CHOL, HDL, LDLCALC, TRIG, CHOLHDL, LDLDIRECT in the last 72 hours. Thyroid Function Tests: No results for input(s): TSH, T4TOTAL, FREET4, T3FREE, THYROIDAB in the last 72 hours. Anemia Panel: No results for input(s): VITAMINB12, FOLATE, FERRITIN, TIBC, IRON, RETICCTPCT in the last 72 hours. Sepsis Labs: Recent Labs  Lab 05/27/20 1318 05/27/20 1642 05/27/20 1843 05/27/20 2101 05/28/20 0435  PROCALCITON  --   --   --   --  0.12  LATICACIDVEN 6.0* 3.0* 3.6* 1.7  --     Recent Results (from the past 240 hour(s))  Blood culture (routine x 2)     Status: None (Preliminary result)   Collection Time: 05/27/20  2:41 PM   Specimen: BLOOD  Result Value Ref Range Status   Specimen Description BLOOD LEFT ANTECUBITAL  Final   Special Requests   Final    BOTTLES DRAWN AEROBIC AND ANAEROBIC Blood Culture results may not be optimal due to an excessive volume of blood received in culture bottles   Culture   Final    NO GROWTH < 24 HOURS Performed at [facility], [address], [place], [place] [address]    Report Status PENDING  Incomplete  Blood culture (routine x 2)     Status: None (Preliminary result)   Collection Time: 05/27/20  3:12 PM   Specimen: BLOOD  Result Value Ref Range Status   Specimen Description BLOOD LEFT ANTECUBITAL  Final   Special Requests   Final    BOTTLES DRAWN AEROBIC AND ANAEROBIC Blood Culture adequate volume   Culture   Final    NO GROWTH < 24 HOURS Performed at [facility], [address], [place], [place] [address]    Report Status PENDING  Incomplete  Resp Panel by RT-PCR (Flu A&B, Covid) Nasopharyngeal Swab     Status: None   Collection Time: 05/27/20   4:42 PM   Specimen: Nasopharyngeal Swab; Nasopharyngeal(NP) swabs in vial transport medium  Result Value Ref Range Status   SARS Coronavirus 2 by RT PCR NEGATIVE NEGATIVE Final    Comment: (NOTE) SARS-CoV-2 target nucleic acids are NOT DETECTED.  The SARS-CoV-2 RNA is generally detectable in upper respiratory specimens during the acute phase of infection. The lowest concentration of SARS-CoV-2 viral copies this assay can detect is 138 copies/mL. A negative result does not preclude SARS-Cov-2 infection and should not be used as  the sole basis for treatment or other patient management decisions. A negative result may occur with  improper specimen collection/handling, submission of specimen other than nasopharyngeal swab, presence of viral mutation(s) within the areas targeted by this assay, and inadequate number of viral copies(<138 copies/mL). A negative result must be combined with clinical observations, patient history, and epidemiological information. The expected result is Negative.  Fact Sheet for Patients:  EntrepreneurPulse.com.au  Fact Sheet for Healthcare Providers:  IncredibleEmployment.be  This test is no t yet approved or cleared by the [place] FDA and  has been authorized for detection and/or diagnosis of SARS-CoV-2 by FDA under an Emergency Use Authorization (EUA). This EUA will remain  in effect (meaning this test can be used) for the duration of the COVID-19 declaration under Section 564(b)(1) of the Act, 21 U.S.C.section 360bbb-3(b)(1), unless the authorization is terminated  or revoked sooner.       Influenza A by PCR NEGATIVE NEGATIVE Final   Influenza B by PCR NEGATIVE NEGATIVE Final    Comment: (NOTE) The Xpert Xpress SARS-CoV-2/FLU/RSV plus assay is intended as an aid in the diagnosis of influenza from Nasopharyngeal swab specimens and should not be used as a sole basis for treatment. Nasal washings and aspirates  are unacceptable for Xpert Xpress SARS-CoV-2/FLU/RSV testing.  Fact Sheet for Patients: EntrepreneurPulse.com.au  Fact Sheet for Healthcare Providers: IncredibleEmployment.be  This test is not yet approved or cleared by the [place] FDA and has been authorized for detection and/or diagnosis of SARS-CoV-2 by FDA under an Emergency Use Authorization (EUA). This EUA will remain in effect (meaning this test can be used) for the duration of the COVID-19 declaration under Section 564(b)(1) of the Act, 21 U.S.C. section 360bbb-3(b)(1), unless the authorization is terminated or revoked.  Performed at [facility], [address] [place], [place] [address]   MRSA PCR Screening     Status: None   Collection Time: 05/27/20  7:52 PM   Specimen: Nasopharyngeal  Result Value Ref Range Status   MRSA by PCR NEGATIVE NEGATIVE Final    Comment:        The GeneXpert MRSA Assay (FDA approved for NASAL specimens only), is one component of a comprehensive MRSA colonization surveillance program. It is not intended to diagnose MRSA infection nor to guide or monitor treatment for MRSA infections. Performed at [facility], [address] [place], [place] [address]          Radiology Studies: CT Head Wo Contrast  Result Date: 05/27/2020 CLINICAL DATA:  Delirium, seizure-like activity EXAM: CT HEAD WITHOUT CONTRAST TECHNIQUE: Contiguous axial images were obtained from the base of the skull through the vertex without intravenous contrast. COMPARISON:  MRI 05/13/2020 FINDINGS: Brain: Normal anatomic configuration. Parenchymal volume loss is commensurate with the patient's age. 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 No abnormal intra or extra-axial mass lesion or fluid collection. No abnormal mass effect or midline shift. No evidence of acute intracranial hemorrhage or infarct. Ventricular size is normal.  Cerebellum unremarkable. Vascular: No asymmetric hyperdense vasculature at the skull base. Skull: Intact Sinuses/Orbits: There is dense opacification of the right frontal sinus with central hyperdensity suggesting chronic or fungal sinusitis again identified. Remaining paranasal sinuses are clear. Orbits are unremarkable. Other: Mastoid air cells and middle ear cavities are clear. IMPRESSION: No acute intracranial abnormality. Chronic right frontal sinus disease. Electronically Signed   By: Fidela Salisbury MD   On: 05/27/2020 14:09   MR BRAIN W WO CONTRAST  Result Date: 05/27/2020 CLINICAL DATA:  Syncope EXAM: MRI HEAD WITHOUT AND WITH CONTRAST TECHNIQUE: Multiplanar, multiecho pulse sequences of the brain and surrounding structures were obtained without and with intravenous contrast. CONTRAST:  46mL GADAVIST GADOBUTROL 1 MMOL/ML IV SOLN COMPARISON:  None. FINDINGS: Brain: No acute infarct, mass effect or extra-axial collection. No acute or chronic hemorrhage. There is multifocal hyperintense T2-weighted signal within the white matter. Parenchymal volume and CSF spaces are normal. 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 The midline structures are normal. There is no abnormal contrast enhancement. Vascular: Major flow voids are preserved. Skull and upper cervical spine: Normal calvarium and skull base. Visualized upper cervical spine and soft tissues are normal. Sinuses/Orbits:Partial right frontal sinus opacification. Normal orbits. IMPRESSION: 1. No acute intracranial abnormality. 2. 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 Electronically Signed   By: Ulyses Jarred M.D.   On: 05/27/2020 23:54   DG Chest Portable 1 View  Result Date: 05/27/2020 CLINICAL DATA:  Altered mental status.  Seizures. EXAM: PORTABLE CHEST 1 VIEW COMPARISON:  11/09/2006 FINDINGS: Poor inspiration. Extensive  overlying artifact. Heart size upper limits of normal. Aortic atherosclerotic calcification is present. Increased markings at the lung bases may simply relate to the poor inspiration. Cannot left a degree of atelectasis or infiltrate. Upper lungs appear clear. No evidence of edema or effusions. No acute bone finding. Chronic postsurgical and degenerative changes of the right shoulder region. IMPRESSION: Increased markings at the lung bases may simply relate to the poor inspiration. Basilar atelectasis or infiltrate not excluded. Electronically Signed   By: Nelson Chimes M.D.   On: 05/27/2020 14:38   CT Renal Stone Study  Result Date: 05/27/2020 CLINICAL DATA:  Dysuria.  Penile pain. EXAM: CT ABDOMEN AND PELVIS WITHOUT CONTRAST TECHNIQUE: Multidetector CT imaging of the abdomen and pelvis was performed following the standard protocol without IV contrast. COMPARISON:  08/06/2009 abdominal ultrasound. No prior CT. FINDINGS: Lower chest: Bibasilar atelectasis. Mild degradation by arm position, motion inferiorly. Mild cardiomegaly with multivessel coronary artery atherosclerosis. Hepatobiliary: EKG wire and lead artifact as well. Grossly normal noncontrast appearance of the liver, gallbladder. No biliary duct dilatation. Pancreas: Normal, without mass or ductal dilatation. Spleen: Normal in size, without focal abnormality. Adrenals/Urinary Tract: Normal adrenal glands. Bilateral punctate renal collecting system calculi, most apparent on coronal reformats. No hydronephrosis. Possible lower pole left renal 2.1 cm lesion on 45/2. No hydroureter or ureteric calculi. No bladder calculi. Bladder wall thickening is mild. Stomach/Bowel: Apparent greater curvature gastric wall thickening at 2.7 cm on 26/2, likely at least partially artifactual in the setting of underdistention. Normal colon, appendix, and terminal ileum. Normal small bowel. Vascular/Lymphatic: Aortic atherosclerosis. No abdominopelvic adenopathy. Reproductive:  Moderate prostatomegaly.  Urolift seeds identified. Other: No significant free fluid. Musculoskeletal: Pelvic sclerotic lesions including at up to 11 mm in the left iliac are most likely bone islands. Degenerate disc disease at the lumbosacral junction is advanced. IMPRESSION: 1. Multifactorial degradation as detailed above. 2. Bilateral nephrolithiasis, without obstructive uropathy. 3. Mild bladder wall thickening which could be related to prostatomegaly/outlet obstruction or cystitis. 4. Status post urolift procedure. 5. Gastric underdistention.  Cannot exclude concurrent gastritis. 6. Coronary artery atherosclerosis. Aortic Atherosclerosis (ICD10-I70.0). 7. Equivocal lesion within the inter/lower pole left kidney. Consider nonemergent renal ultrasound versus more complete characterization with pre and post-contrast renal mass protocol CT. MRI likely not optimal secondary to patient age and comorbidities. Electronically Signed  By: Abigail Miyamoto M.D.   On: 05/27/2020 15:28        Scheduled Meds: . aspirin EC  81 mg Oral Daily  . atorvastatin  40 mg Oral Daily  . Chlorhexidine Gluconate Cloth  6 each Topical Daily  . cholecalciferol  1,000 Units Oral Daily  . enoxaparin (LOVENOX) injection  40 mg Subcutaneous Q24H  . ferrous sulfate  325 mg Oral Once per day on Mon Wed Fri  . mouth rinse  15 mL Mouth Rinse BID  . multivitamin with minerals  1 tablet Oral Daily  . pantoprazole (PROTONIX) IV  40 mg Intravenous Q24H   Continuous Infusions: . 0.9 % NaCl with KCl 20 mEq / L 125 mL/hr at 05/28/20 0600  . ceFEPime (MAXIPIME) IV Stopped (05/28/20 0251)     LOS: 1 day    Time spent: 35 mins     Wyvonnia Dusky, MD [facility] Pager [phone]  If 7PM-7AM, please contact night-coverage 05/28/2020, 7:40 AM

## 2020-05-28 NOTE — Consult Note (Signed)
   Brief Consult Note  Dustin Crane is a 80 y.o. male s/p UroLift with Dr. Erlene Quan on 05/19/2020 admitted yesterday with sepsis secondary to UTI after experiencing a syncopal episode versus seizure in our clinic.  Patient's postoperative course has been notable for urinary retention requiring Foley catheter replacement, after which he developed severe bladder spasms that we attempted to manage with anticholinergics and in and out catheterization.  Foley catheter was replaced in the emergency department yesterday with significant 50 mL urinary output.  Curbside consult urology has been placed.  Leukocytosis improving today, 12.2.  Lactate normalized, 1.7.  Urine culture pending and blood cultures with no growth at <24 hours; on antibiotics as below.  CT stone study yesterday with mild bladder wall thickening consistent with cystitis versus outlet obstruction without evidence of hematoma or abscess.  Patient reports ongoing bladder spasms associated with his Foley catheter, however his pain is improved compared to prior.  Physical exam today notable for delirium.  Recommendations: -Continue empiric antibiotics and follow urine cultures.  He will require 10 to 14 days of antibiotic therapy. -Continue Foley catheter for maximal bladder decompression and infection source control -Continue pain management including oxybutynin and B&O suppositories for bladder spasm  Anti-infectives (From admission, onward)   Start     Dose/Rate Route Frequency Ordered Stop   05/28/20 1100  ceFEPIme (MAXIPIME) 2 g in sodium chloride 0.9 % 100 mL IVPB        2 g 200 mL/hr over 30 Minutes Intravenous Every 8 hours 05/28/20 0827     05/28/20 0300  ceFEPIme (MAXIPIME) 2 g in sodium chloride 0.9 % 100 mL IVPB  Status:  Discontinued        2 g 200 mL/hr over 30 Minutes Intravenous Every 12 hours 05/27/20 1902 05/28/20 0827   05/27/20 1830  ceFEPIme (MAXIPIME) 2 g in sodium chloride 0.9 % 100 mL IVPB  Status:  Discontinued         2 g 200 mL/hr over 30 Minutes Intravenous  Once 05/27/20 1825 05/27/20 1836   05/27/20 1500  ceFEPIme (MAXIPIME) 2 g in sodium chloride 0.9 % 100 mL IVPB        2 g 200 mL/hr over 30 Minutes Intravenous  Once 05/27/20 1452 05/27/20 1549   05/27/20 1430  cefTRIAXone (ROCEPHIN) 2 g in sodium chloride 0.9 % 100 mL IVPB  Status:  Discontinued        2 g 200 mL/hr over 30 Minutes Intravenous  Once 05/27/20 1427 05/27/20 [place], PA-C 05/28/2020 9:26 AM

## 2020-05-28 NOTE — Progress Notes (Signed)
Neurology Progress Note  Patient ID: Dustin Crane is a 80 y.o. with PMHx of  has a past medical history of Arthritis, Cancer ([place]), Coronary artery disease, Diabetes mellitus without complication ([place]), GERD (gastroesophageal reflux disease), Hyperlipemia, Hypertension, Myocardial infarction ([place]) (2004), Peripheral vascular disease ([place]), TIA (transient ischemic attack) (10/2018), Wears glasses, and Wears hearing aid.  Initially consulted for: Syncope versus seizure  Major interval events/Subjective: Major improvement in pain control  Exam: Vitals:   05/28/20 1400 05/28/20 1500  BP: (!) 135/52 121/84  Pulse: 73 74  Resp: (!) 23 (!) 21  Temp:    SpO2: 98% 93%   Gen: In bed, comfortable  Resp: non-labored breathing, no grossly audible wheezing Cardiac: Perfusing extremities well  Abd: soft, nt  Neuro: MS: Poor attention and concentration but now able to answer orientation questions.  Knows that it is 9485, but uncertain about the month or date.  Able to follow simple commands.  No aphasia or neglect. CN: EOMI.  Left pupil irregular/postsurgical, but both are reactive.  Face symmetric, tongue midline Motor: Moving all 4 extremities equally antigravity.  No pronator drift in the bilateral upper extremities.  Mild pain limitation in the bilateral lower extremities but much improved from yesterday Sensory: Intact to light touch throughout  Pertinent Labs: CK stable from 270 -> 377 Leukocytosis improving to 12.2  Impression: Patient's mental status has improved dramatically, and appears mostly related to pain control.  Regarding his spell of loss of consciousness yesterday, highest on my differential is vasovagal post syncopal convulsions secondary to pain/infection.  Given initial labs, provoked seizure in the setting of infection and sleep deprivation is also a possibility but overall given the generalized description, nonfocal exam when he was postictal and normal routine EEG  immediately after the event, will defer on starting antiseizure medications at this time and do not believe he needs a diagnosis of epilepsy.   Recommendations: No further inpatient work-up needed at this time from a neurological perspective  Discharge instructions should include seizure precautions given this episode of loss of consciousness. Standard seizure precautions: Per [place] statutes, patients with seizures are not allowed to drive until  they have been seizure-free for six months. Use caution when using heavy equipment or power tools. Avoid working on ladders or at heights. Take showers instead of baths. Ensure the water temperature is not too high on the home water heater. Do not go swimming alone. When caring for infants or small children, sit down when holding, feeding, or changing them to minimize risk of injury to the child in the event you have a seizure.  To reduce risk of seizures, maintain good sleep hygiene avoid alcohol and illicit drug use, take all anti-seizure medications as prescribed.   Neurology will be available on an as-needed basis going forward, please repage if any questions arise  Dustin Crane [phone]

## 2020-05-28 NOTE — Progress Notes (Signed)
Pt admitted to 1A140

## 2020-05-28 NOTE — Consult Note (Signed)
Pharmacy Antibiotic Note  Dustin Crane is a 80 y.o. male admitted on 05/27/2020 with UTI. He was seen by outpatient urology on 3/14 for a  UroLift procedure. Post operatively, patient required a foley cath. Due to pain and urinary retention, foley was removed and reinserted several times before complete removal one day PTA. Subsequently, patient had severe pain (10/10) and unable to void later that night. He was taken to ED for evaluation the following day. Bladder scan showed about 250 mL urine in bladder and patient was in extreme pain after attempts to void. Diagnosed with sepsis secondary to UTI. Pharmacy has been consulted for cefepime dosing.  Assessment:  Pt. received cefepime 2 g IV x 1 in the ED. Currently on day 2 of cefepime.  Lactic acid: 3 > 3.6 > 1.7 Procal: 0.12 WBC: 21.2 > 12.2   SCr: 1.13 > 0.75 UCx/BCx: NGTD  Plan: - Increased cefepime dosing frequency 2 g IV q12h > 2 g q8h - Monitor renal function - F/u UCx, BCx for abx deescalation and length of therapy   Height: 5\' 8"  (172.7 cm) Weight: 81.2 kg (179 lb 0.2 oz) IBW/kg (Calculated) : 68.4  Temp (24hrs), Avg:98.2 F (36.8 C), Min:97.8 F (36.6 C), Max:98.8 F (37.1 C)  Recent Labs  Lab 05/27/20 1318 05/27/20 1642 05/27/20 1843 05/27/20 2101 05/28/20 0435  WBC 21.2*  --   --   --  12.2*  CREATININE 1.13  --   --   --  0.75  LATICACIDVEN 6.0* 3.0* 3.6* 1.7  --     Estimated Creatinine Clearance: 74.8 mL/min (by C-G formula based on SCr of 0.75 mg/dL).    Allergies  Allergen Reactions  . Haemophilus Influenzae Anaphylaxis, Shortness Of Breath and Rash    Rash-severe n/v/d Ended up in hospital after taking!   . Hemophilus B Polysaccharide Vaccine Anaphylaxis, Shortness Of Breath and Rash    Rash-severe n/v/d   . Influenza Vac Split [Influenza Virus Vaccine] Shortness Of Breath and Other (See Comments)    Rash-severe n/v/d  . Hydroxyzine     Patient denies?????  . Compazine [Prochlorperazine  Edisylate] Other (See Comments)    Muscle tightness    Antimicrobials this admission: 3/22 cefepime >>   Dose adjustments this admission: 3/23 Cefepime: 2 g q12h > 2 g q8h  Microbiology results: 3/22 BCx: NGTD 3/22 UCx: pending 3/22 MRSA PRCR: negative  Thank you for allowing pharmacy to be a part of this patient's care.  Kennyth Lose  Tan,Student-PharmD 05/28/2020 12:12 PM

## 2020-05-28 NOTE — Progress Notes (Signed)
Inpatient Diabetes Program Recommendations  AACE/ADA: New Consensus Statement on Inpatient Glycemic Control (2015)  Target Ranges:  Prepandial:   less than 140 mg/dL      Peak postprandial:   less than 180 mg/dL (1-2 hours)      Critically ill patients:  140 - 180 mg/dL   Lab Results  Component Value Date   GLUCAP 343 (H) 05/28/2020   HGBA1C 8.1 (H) 05/27/2020    Review of Glycemic Control Results for MACALLAN, ORD (MRN [id number]) as of 05/28/2020 11:43  Ref. Range 05/27/2020 18:32 05/27/2020 21:06 05/27/2020 23:38 05/28/2020 03:36 05/28/2020 11:22  Glucose-Capillary Latest Ref Range: 70 - 99 mg/dL 270 (H) 233 (H) 183 (H) 175 (H) 343 (H)   Diabetes history: DM 2 Outpatient Diabetes medications:  Metformin 1000 mg bid, Amaryl 4 mg bid, Actos 30 mg daily, Januvia 100 mg daily Current orders for Inpatient glycemic control:  Novolog sensitive tid with meals  Inpatient Diabetes Program Recommendations:    While in the hospital consider adding Lantus 15 units daily and Novolog 3 units tid with meals (hold if patient eats less than 50%).   Thanks,  Adah Perl, RN, BC-ADM Inpatient Diabetes Coordinator Pager [phone] (8a-5p)

## 2020-05-29 DIAGNOSIS — R338 Other retention of urine: Secondary | ICD-10-CM

## 2020-05-29 DIAGNOSIS — R410 Disorientation, unspecified: Secondary | ICD-10-CM

## 2020-05-29 LAB — GLUCOSE, CAPILLARY
Glucose-Capillary: 166 mg/dL — ABNORMAL HIGH (ref 70–99)
Glucose-Capillary: 171 mg/dL — ABNORMAL HIGH (ref 70–99)
Glucose-Capillary: 289 mg/dL — ABNORMAL HIGH (ref 70–99)
Glucose-Capillary: 232 mg/dL — ABNORMAL HIGH (ref 70–99)

## 2020-05-29 LAB — CBC
HCT: 34.7 % — ABNORMAL LOW (ref 39.0–52.0)
Hemoglobin: 12.1 g/dL — ABNORMAL LOW (ref 13.0–17.0)
MCH: 30.6 pg (ref 26.0–34.0)
MCHC: 34.9 g/dL (ref 30.0–36.0)
MCV: 87.6 fL (ref 80.0–100.0)
Platelets: 198 10*3/uL (ref 150–400)
RBC: 3.96 MIL/uL — ABNORMAL LOW (ref 4.22–5.81)
RDW: 12 % (ref 11.5–15.5)
WBC: 9 10*3/uL (ref 4.0–10.5)
nRBC: 0 % (ref 0.0–0.2)

## 2020-05-29 LAB — BASIC METABOLIC PANEL
Anion gap: 6 (ref 5–15)
BUN: 10 mg/dL (ref 8–23)
CO2: 26 mmol/L (ref 22–32)
Calcium: 8.4 mg/dL — ABNORMAL LOW (ref 8.9–10.3)
Chloride: 108 mmol/L (ref 98–111)
Creatinine, Ser: 0.86 mg/dL (ref 0.61–1.24)
GFR, Estimated: >60 mL/min (ref >60)
Glucose, Bld: 171 mg/dL — ABNORMAL HIGH (ref 70–99)
Potassium: 4 mmol/L (ref 3.5–5.1)
Sodium: 140 mmol/L (ref 135–145)

## 2020-05-29 LAB — VITAMIN B12: Vitamin B-12: 223 pg/mL (ref 180–914)

## 2020-05-29 MED ORDER — QUETIAPINE FUMARATE 25 MG PO TABS
50.0000 mg | ORAL_TABLET | Freq: Every day | ORAL | Status: DC
  Administered 2020-05-29: 50 mg via ORAL
  Filled 2020-05-29: qty 2

## 2020-05-29 MED ORDER — PHENAZOPYRIDINE HCL 200 MG PO TABS
200.0000 mg | ORAL_TABLET | Freq: Three times a day (TID) | ORAL | Status: DC
  Administered 2020-05-29 – 2020-05-30 (×3): 200 mg via ORAL
  Filled 2020-05-29 (×4): qty 1

## 2020-05-29 MED ORDER — MELATONIN 5 MG PO TABS
5.0000 mg | ORAL_TABLET | Freq: Every day | ORAL | Status: DC
  Administered 2020-05-29: 5 mg via ORAL
  Filled 2020-05-29: qty 1

## 2020-05-29 MED ORDER — RAMIPRIL 10 MG PO CAPS
10.0000 mg | ORAL_CAPSULE | Freq: Every evening | ORAL | Status: DC
  Administered 2020-05-29: 10 mg via ORAL
  Filled 2020-05-29 (×2): qty 1

## 2020-05-29 MED ORDER — LACTULOSE 10 GM/15ML PO SOLN
30.0000 g | Freq: Every day | ORAL | Status: DC
  Filled 2020-05-29: qty 60

## 2020-05-29 MED ORDER — HALOPERIDOL LACTATE 5 MG/ML IJ SOLN
5.0000 mg | Freq: Once | INTRAMUSCULAR | Status: AC
  Administered 2020-05-29: 5 mg via INTRAVENOUS
  Filled 2020-05-29: qty 1

## 2020-05-29 MED ORDER — TAMSULOSIN HCL 0.4 MG PO CAPS
0.4000 mg | ORAL_CAPSULE | Freq: Every day | ORAL | Status: DC
  Administered 2020-05-29 – 2020-05-30 (×2): 0.4 mg via ORAL
  Filled 2020-05-29 (×2): qty 1

## 2020-05-29 MED ORDER — HALOPERIDOL LACTATE 5 MG/ML IJ SOLN
2.5000 mg | Freq: Once | INTRAMUSCULAR | Status: AC
  Administered 2020-05-29: 2.5 mg via INTRAVENOUS
  Filled 2020-05-29: qty 1

## 2020-05-29 NOTE — Progress Notes (Addendum)
Neurology Progress Note  Patient ID: Dustin Crane is a 80 y.o. with PMHx of  has a past medical history of Arthritis, Cancer ([place]), Coronary artery disease, Diabetes mellitus without complication ([place]), GERD (gastroesophageal reflux disease), Hyperlipemia, Hypertension, Myocardial infarction ([place]) (2004), Peripheral vascular disease ([place]), TIA (transient ischemic attack) (10/2018), Wears glasses, and Wears hearing aid.  Initially consulted for: Syncope vs. seizure  Major interval events:  - Agitated delirium overnight, haldol 2.5 mg 2140, 5 mg at 5 AM,  - morphine 2 mg at 22:17 and 2:30 - percocet at 1821 and 2:15 - trazodone at 2140 - continues on cefepime   Subjective: Alert and pleasant this morning with his only complaint being ongoing difficulty with his urination   Current Facility-Administered Medications:  .  0.9 % NaCl with KCl 20 mEq/ L  infusion, , Intravenous, Continuous, Wyvonnia Dusky, MD, Paused at 05/29/20 0225 .  aspirin EC tablet 81 mg, 81 mg, Oral, Daily, Agbata, Tochukwu, MD, 81 mg at 05/29/20 1051 .  atorvastatin (LIPITOR) tablet 40 mg, 40 mg, Oral, Daily, Agbata, Tochukwu, MD, 40 mg at 05/29/20 1051 .  belladonna-opium (B&O) suppository 16.2-60mg , 1 suppository, Rectal, Q8H PRN, Agbata, Tochukwu, MD, 1 suppository at 05/29/20 0312 .  ceFEPIme (MAXIPIME) 2 g in sodium chloride 0.9 % 100 mL IVPB, 2 g, Intravenous, Q8H, Benn Moulder, RPH, Last Rate: 200 mL/hr at 05/29/20 0543, 2 g at 05/29/20 0543 .  Chlorhexidine Gluconate Cloth 2 % PADS 6 each, 6 each, Topical, Daily, Agbata, Tochukwu, MD, 6 each at 05/29/20 1057 .  cholecalciferol (VITAMIN D3) tablet 1,000 Units, 1,000 Units, Oral, Daily, Agbata, Tochukwu, MD, 1,000 Units at 05/29/20 1052 .  docusate sodium (COLACE) capsule 200 mg, 200 mg, Oral, BID, Wyvonnia Dusky, MD, 200 mg at 05/29/20 1051 .  enoxaparin (LOVENOX) injection 40 mg, 40 mg, Subcutaneous, Q24H, Agbata, Tochukwu, MD, 40 mg at  05/28/20 2140 .  ferrous sulfate tablet 325 mg, 325 mg, Oral, Once per day on Mon Wed Fri, Agbata, Tochukwu, MD, 325 mg at 05/28/20 0758 .  insulin aspart (novoLOG) injection 0-5 Units, 0-5 Units, Subcutaneous, QHS, Wyvonnia Dusky, MD, 3 Units at 05/28/20 2140 .  insulin aspart (novoLOG) injection 0-9 Units, 0-9 Units, Subcutaneous, TID WC, Wyvonnia Dusky, MD, 2 Units at 05/29/20 1239 .  insulin aspart (novoLOG) injection 3 Units, 3 Units, Subcutaneous, TID WC, Wyvonnia Dusky, MD, 3 Units at 05/29/20 1240 .  insulin glargine (LANTUS) injection 15 Units, 15 Units, Subcutaneous, Daily, Wyvonnia Dusky, MD, 15 Units at 05/29/20 1053 .  lactulose (CHRONULAC) 10 GM/15ML solution 30 g, 30 g, Oral, Daily, Eppie Gibson M, MD .  LORazepam (ATIVAN) injection 2 mg, 2 mg, Intravenous, Q2H PRN, Agbata, Tochukwu, MD .  MEDLINE mouth rinse, 15 mL, Mouth Rinse, BID, Agbata, Tochukwu, MD, 15 mL at 05/29/20 1053 .  morphine 2 MG/ML injection 2 mg, 2 mg, Intravenous, Q4H PRN, Agbata, Tochukwu, MD, 2 mg at 05/29/20 0230 .  multivitamin with minerals tablet 1 tablet, 1 tablet, Oral, Daily, Agbata, Tochukwu, MD, 1 tablet at 05/29/20 1051 .  ondansetron (ZOFRAN) tablet 4 mg, 4 mg, Oral, Q6H PRN **OR** ondansetron (ZOFRAN) injection 4 mg, 4 mg, Intravenous, Q6H PRN, Agbata, Tochukwu, MD .  oxybutynin (DITROPAN) tablet 5 mg, 5 mg, Oral, Q8H PRN, Agbata, Tochukwu, MD, 5 mg at 05/29/20 0215 .  oxyCODONE-acetaminophen (PERCOCET) 7.5-325 MG per tablet 1 tablet, 1 tablet, Oral, Q4H PRN, Wyvonnia Dusky, MD, 1 tablet at 05/29/20 0215 .  pantoprazole (PROTONIX) injection 40 mg, 40 mg, Intravenous, Q24H, Athena Masse, MD, 40 mg at 05/28/20 2140 .  polyethylene glycol (MIRALAX / GLYCOLAX) packet 17 g, 17 g, Oral, Daily, Wyvonnia Dusky, MD .  traZODone (DESYREL) tablet 50 mg, 50 mg, Oral, QHS PRN, Sharion Settler, NP, 50 mg at 05/28/20 2140   Exam: Current vital signs: BP (!) 146/71 (BP  Location: Right Arm)   Pulse 67   Temp (!) 97.4 F (36.3 C) (Oral)   Resp 20   Ht 5\' 8"  (1.727 m)   Wt 81.2 kg   SpO2 100%   BMI 27.22 kg/m  Vital signs in last 24 hours: Temp:  [97.4 F (36.3 C)-99.6 F (37.6 C)] 97.4 F (36.3 C) (03/24 0737) Pulse Rate:  [55-112] 67 (03/24 0737) Resp:  [15-23] 20 (03/24 0737) BP: (114-173)/(52-89) 146/71 (03/24 0737) SpO2:  [93 %-100 %] 100 % (03/24 0737)   Gen: In bed, comfortable  Resp: non-labored breathing, no grossly audible wheezing Cardiac: Perfusing extremities well  Abd: soft, nt  Neuro: MS: Awake, alert, oriented to year but struggles with the month.  Well aware of his difficulty peeing.  Able to correct his wife that they have been married for 26 years not 35. CN: Pupils equal and round, face symmetric, tongue midline Motor: Moving all 4 extremities equally antigravity and freely Sensory: Intact to light touch throughout  Pertinent Labs:   BUN 10 and Cr 0.85 (stable) CBC stable (Hemoglobin 12.1)  No results found for: VITAMINB12   Urine with Gram negative rods  Impression: The patient's confusion, inattention (unable to repeat days of the week backwards), and waxing / waning mental status is most suggestive of delirium, perhaps in the setting of infection, polypharmacy (benzodiazepines and opiate exposure as well as cefepime), pain, hospitalization / intensive care, and disrupted sleep cycle.   Alternative explanations for symptoms could include subclinical seizures less likely given this presentation and negative EEG and stroke (negative MRI brain). No signs of meningitis / encephalitis in vital signs, exam, or imaging.   Recommendations: - Narrow cefepime to as soon as able  - I do appreciate that there is a challenge of balancing pain control with use of opiates. Consider scheduled tylenol and ibuprofen and B&O suppositories (less systemic effect) to reduce need for systemic opiates - Seroquel (50 mg nightly) as needed  while monitoring QTc to reduce aggressive behavior, uptitrating slowly as needed; taper to 25 mg as needed prior to discharge. - Melatonin 3 mg nightly as patient uses this at home  Additionally, please follow these delirium precautions:   - try to minimize deliriogenic medications as much as possible (J Am Geriatr Soc. 2012 Apr;60(4):616-31): benzodiazepines, anticholinergics, diphenhydramine, antihistamines, narcotics, Ambien/Lunesta/Sonata etc.; - environmental support for delirium: Lights on during the day, patient up and out of bed as much as is feasible, OT/PT, quiet dimly lit room at night, reorient patient often, provide hearing aides and glasses if patient uses them routinely, minimize sleep disruptions as much as possible overnight.  As much as possible, reorient patient, and have them engage patient in activities, e.g. playing cards. TV should be off or on neutral background music unless patient engaged and watching. Try to keep interactions with the patient calm and quiet.     Neurology will be available on an as-needed basis going forward, please reconsult if new questions arise.  Lesleigh Noe MD-PhD [facility] [phone]  [facility] coverage for [facility] is from 8 AM to 4 AM in-house and  4 PM to 8 PM by telephone/video. 8 PM to 8 AM emergent questions or overnight urgent questions should be addressed to Teleneurology On-call or [facility] neurohospitalist; contact information can be found on AMION  37 minutes were spent in care of this patient today given extended discussion with his wife about delirium, in addition to time spent reviewing the chart, examining the patient, and formulating and implementing the plan as above as well as communication with the primary team and nursing.  Greater than 50% of this time was in direct patient care.

## 2020-05-29 NOTE — Plan of Care (Signed)

## 2020-05-29 NOTE — Progress Notes (Addendum)
PROGRESS NOTE    Dustin Crane  MPN:[id number] DOB: Jul 12, 1940 DOA: 05/27/2020 PCP: Idelle Crouch, MD   Assessment & Plan:   Principal Problem:   Sepsis secondary to UTI [facility]) Active Problems:   Benign essential hypertension   CAD (coronary artery disease)   Diabetes mellitus type 2 with peripheral artery disease ([place])   Gastroesophageal reflux disease without esophagitis   Delirium   Seizure ([place])   Hypokalemia   Hypomagnesemia     Sepsis secondary to UTI: meets criteria w/ tachycardia, leukocytosis, elevated lactic acid & UTI. S/p recent urolift procedure. Continue on IV cefepime. Morphine, percocet prn for pain. Blood cx NGTD. Resolved  UTI: urine cx is growing gram neg rods, sens pending. Continue on IV cefepime. Will do a voiding trial today as per urology   Urinary retention: continue on flomax, pyridium as per uro. Voiding trial today   Leukocytosis: resolved   Delirium/metabolic encephalopathy: likely secondary to UTI. Re-orient prn   DM2: likely poorly controlled. Continue on lantus, SSI w/ accuchecks. Continue to hold home dose of metformin   R/o new onset seizure: s/p keppra given as per neuro. EEG did not show any seizure activity. Neuro signed off    CAD: stable. Continue on aspirin, statin   HTN: restart home dose of ramipril   Hypokalemia: within normal limits today    DVT prophylaxis: lovenox  Code Status: full  Family Communication: discussed pt's care w/ pt's wife at bedside and answered her questions Disposition Plan: depends on PT/OT recs   Level of care: Med-Surg   Status is: Inpatient  Remains inpatient appropriate because:IV treatments appropriate due to intensity of illness or inability to take PO and Inpatient level of care appropriate due to severity of illness   Dispo: The patient is from: Home              Anticipated d/c is to: Home              Patient currently is not medically stable to d/c.   Difficult to place  patient No        Consultants:   Urology  Neuro    Procedures:    Antimicrobials:    Subjective: Pt c/o bladder & penis pain   Objective: Vitals:   05/28/20 1902 05/28/20 1945 05/28/20 2056 05/29/20 0737  BP: (!) 173/75 (!) 144/88 (!) 171/87 (!) 146/71  Pulse: (!) 55 87 93 67  Resp: 16 16 16 20   Temp: 97.7 F (36.5 C) 99.6 F (37.6 C) 98.6 F (37 C) (!) 97.4 F (36.3 C)  TempSrc: Oral Oral Oral Oral  SpO2: 97% 97% 98% 100%  Weight:      Height:        Intake/Output Summary (Last 24 hours) at 05/29/2020 1422 Last data filed at 05/29/2020 0086 Gross per 24 hour  Intake 526.74 ml  Output 2550 ml  Net -2023.26 ml   Filed Weights   05/27/20 1419  Weight: 81.2 kg    Examination:  General exam: Appears comfortable  Respiratory system: clear breath sounds b/l  Cardiovascular system: S1/S2+. No rubs or clicks  Gastrointestinal system: Abd is soft, NT, ND & hypoactive bowel sounds  Central nervous system: Alert and awake. Moves all 4 extremities  Psychiatry: Judgement and insight appear abnormal. Flat mood and affect   Data Reviewed: I have personally reviewed following labs and imaging studies  CBC: Recent Labs  Lab 05/27/20 1318 05/28/20 0435 05/29/20 0730  WBC 21.2* 12.2* 9.0  NEUTROABS 14.5*  --   --   HGB 14.7 12.7* 12.1*  HCT 44.4 36.4* 34.7*  MCV 90.6 87.1 87.6  PLT 284 201 376   Basic Metabolic Panel: Recent Labs  Lab 05/27/20 1318 05/28/20 0435 05/29/20 0730  NA 138 138 140  K 3.3* 3.5 4.0  CL 98 106 108  CO2 13* 24 26  GLUCOSE 307* 211* 171*  BUN 17 11 10   CREATININE 1.13 0.75 0.86  CALCIUM 9.4 8.2* 8.4*  MG 1.4*  --   --    GFR: Estimated Creatinine Clearance: 69.6 mL/min (by C-G formula based on SCr of 0.86 mg/dL). Liver Function Tests: Recent Labs  Lab 05/27/20 1318  AST 43*  ALT 22  ALKPHOS 85  BILITOT 1.6*  PROT 7.9  ALBUMIN 4.2   No results for input(s): LIPASE, AMYLASE in the last 168 hours. No results  for input(s): AMMONIA in the last 168 hours. Coagulation Profile: Recent Labs  Lab 05/28/20 0435  INR 1.1   Cardiac Enzymes: Recent Labs  Lab 05/27/20 1642 05/28/20 0435  CKTOTAL 270 377   BNP (last 3 results) No results for input(s): PROBNP in the last 8760 hours. HbA1C: Recent Labs    05/27/20 1843  HGBA1C 8.1*   CBG: Recent Labs  Lab 05/28/20 1122 05/28/20 1511 05/28/20 2140 05/29/20 0733 05/29/20 1201  GLUCAP 343* 118* 259* 166* 171*   Lipid Profile: No results for input(s): CHOL, HDL, LDLCALC, TRIG, CHOLHDL, LDLDIRECT in the last 72 hours. Thyroid Function Tests: No results for input(s): TSH, T4TOTAL, FREET4, T3FREE, THYROIDAB in the last 72 hours. Anemia Panel: No results for input(s): VITAMINB12, FOLATE, FERRITIN, TIBC, IRON, RETICCTPCT in the last 72 hours. Sepsis Labs: Recent Labs  Lab 05/27/20 1318 05/27/20 1642 05/27/20 1843 05/27/20 2101 05/28/20 0435  PROCALCITON  --   --   --   --  0.12  LATICACIDVEN 6.0* 3.0* 3.6* 1.7  --     Recent Results (from the past 240 hour(s))  Urine culture     Status: Abnormal (Preliminary result)   Collection Time: 05/27/20  1:18 PM   Specimen: Urine, Random  Result Value Ref Range Status   Specimen Description   Final    URINE, RANDOM Performed at [facility], [address], [place], [place] [address]    Special Requests   Final    NONE Performed at [facility], [address], [place], [place] [address]    Culture (A)  Final    >=100,000 COLONIES/mL GRAM NEGATIVE RODS IDENTIFICATION AND SUSCEPTIBILITIES TO FOLLOW Performed at [facility], [place] [address] [place], [place] [address]    Report Status PENDING  Incomplete  Blood culture (routine x 2)     Status: None (Preliminary result)   Collection Time: 05/27/20  2:41 PM   Specimen: BLOOD  Result Value Ref Range Status   Specimen Description BLOOD LEFT ANTECUBITAL  Final   Special Requests   Final    BOTTLES DRAWN  AEROBIC AND ANAEROBIC Blood Culture results may not be optimal due to an excessive volume of blood received in culture bottles   Culture   Final    NO GROWTH 2 DAYS Performed at [facility], [address] [place], [place] [address]    Report Status PENDING  Incomplete  Blood culture (routine x 2)     Status: None (Preliminary result)   Collection Time: 05/27/20  3:12 PM   Specimen: BLOOD  Result Value Ref Range Status  Specimen Description BLOOD LEFT ANTECUBITAL  Final   Special Requests   Final    BOTTLES DRAWN AEROBIC AND ANAEROBIC Blood Culture adequate volume   Culture   Final    NO GROWTH 2 DAYS Performed at [facility], [address], [place], [place] [address]    Report Status PENDING  Incomplete  Resp Panel by RT-PCR (Flu A&B, Covid) Nasopharyngeal Swab     Status: None   Collection Time: 05/27/20  4:42 PM   Specimen: Nasopharyngeal Swab; Nasopharyngeal(NP) swabs in vial transport medium  Result Value Ref Range Status   SARS Coronavirus 2 by RT PCR NEGATIVE NEGATIVE Final    Comment: (NOTE) SARS-CoV-2 target nucleic acids are NOT DETECTED.  The SARS-CoV-2 RNA is generally detectable in upper respiratory specimens during the acute phase of infection. The lowest concentration of SARS-CoV-2 viral copies this assay can detect is 138 copies/mL. A negative result does not preclude SARS-Cov-2 infection and should not be used as the sole basis for treatment or other patient management decisions. A negative result may occur with  improper specimen collection/handling, submission of specimen other than nasopharyngeal swab, presence of viral mutation(s) within the areas targeted by this assay, and inadequate number of viral copies(<138 copies/mL). A negative result must be combined with clinical observations, patient history, and epidemiological information. The expected result is Negative.  Fact Sheet for Patients:   EntrepreneurPulse.com.au  Fact Sheet for Healthcare Providers:  IncredibleEmployment.be  This test is no t yet approved or cleared by the [place] FDA and  has been authorized for detection and/or diagnosis of SARS-CoV-2 by FDA under an Emergency Use Authorization (EUA). This EUA will remain  in effect (meaning this test can be used) for the duration of the COVID-19 declaration under Section 564(b)(1) of the Act, 21 U.S.C.section 360bbb-3(b)(1), unless the authorization is terminated  or revoked sooner.       Influenza A by PCR NEGATIVE NEGATIVE Final   Influenza B by PCR NEGATIVE NEGATIVE Final    Comment: (NOTE) The Xpert Xpress SARS-CoV-2/FLU/RSV plus assay is intended as an aid in the diagnosis of influenza from Nasopharyngeal swab specimens and should not be used as a sole basis for treatment. Nasal washings and aspirates are unacceptable for Xpert Xpress SARS-CoV-2/FLU/RSV testing.  Fact Sheet for Patients: EntrepreneurPulse.com.au  Fact Sheet for Healthcare Providers: IncredibleEmployment.be  This test is not yet approved or cleared by the [place] FDA and has been authorized for detection and/or diagnosis of SARS-CoV-2 by FDA under an Emergency Use Authorization (EUA). This EUA will remain in effect (meaning this test can be used) for the duration of the COVID-19 declaration under Section 564(b)(1) of the Act, 21 U.S.C. section 360bbb-3(b)(1), unless the authorization is terminated or revoked.  Performed at [facility], [place] [place], [place] [address]   MRSA PCR Screening     Status: None   Collection Time: 05/27/20  7:52 PM   Specimen: Nasopharyngeal  Result Value Ref Range Status   MRSA by PCR NEGATIVE NEGATIVE Final    Comment:        The GeneXpert MRSA Assay (FDA approved for NASAL specimens only), is one component of a comprehensive MRSA  colonization surveillance program. It is not intended to diagnose MRSA infection nor to guide or monitor treatment for MRSA infections. Performed at [facility], [address] [place], [place] [address]          Radiology Studies: MR BRAIN W WO CONTRAST  Result Date:  05/27/2020 CLINICAL DATA:  Syncope EXAM: MRI HEAD WITHOUT AND WITH CONTRAST TECHNIQUE: Multiplanar, multiecho pulse sequences of the brain and surrounding structures were obtained without and with intravenous contrast. CONTRAST:  22mL GADAVIST GADOBUTROL 1 MMOL/ML IV SOLN COMPARISON:  None. FINDINGS: Brain: No acute infarct, mass effect or extra-axial collection. No acute or chronic hemorrhage. There is multifocal hyperintense T2-weighted signal within the white matter. Parenchymal volume and CSF spaces are normal. 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 The midline structures are normal. There is no abnormal contrast enhancement. Vascular: Major flow voids are preserved. Skull and upper cervical spine: Normal calvarium and skull base. Visualized upper cervical spine and soft tissues are normal. Sinuses/Orbits:Partial right frontal sinus opacification. Normal orbits. IMPRESSION: 1. No acute intracranial abnormality. 2. 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 Electronically Signed   By: Ulyses Jarred M.D.   On: 05/27/2020 23:54   EEG adult  Result Date: 05/28/2020 Philemon Kingdom, MD     05/28/2020 11:41 AM Date of Study: 05/27/20 Reason for Study: Pt is a 72 YOM with PMH of HTN, HLD, DM II, CAD, GERD, PVD who presented unresponsive and had jerking movements involving his upper and lower extremities suggestive of a possible seizure activity. Eval for seizures. Description of recording: This recording was obtained using a Digital EEG system with 18 channel capacity . Standard bipolar and  referential EEG montages were used following the International  10 - 20  System. This is a digitally recorded EEG with duration of approximately 25 minutes. The background consists of 7-8 hz alpha rhythm which attenuates with eye opening and closure.  There were no epileptiform discharges, focal slowing, or recorded electrographic seizures.  Hyperventilation was not performed. Photic stimulation was performed and it did not elicit any electrographic seizures. Stage II sleep did not occur in this recording.  EKG shows normal sinus rhythm throughout the study. IMPRESSION: This is a normal EEG for age. There were no clinical seizures or epileptiform discharges noted during study. CLINICAL CORRELATION: This EEG finding did not support the diagnosis of seizure. Clinical correlation is advised. A normal EEG does not rule out epilepsy, which is a clinical diagnosis. I have personally reviewed this study.   CT Renal Stone Study  Result Date: 05/27/2020 CLINICAL DATA:  Dysuria.  Penile pain. EXAM: CT ABDOMEN AND PELVIS WITHOUT CONTRAST TECHNIQUE: Multidetector CT imaging of the abdomen and pelvis was performed following the standard protocol without IV contrast. COMPARISON:  08/06/2009 abdominal ultrasound. No prior CT. FINDINGS: Lower chest: Bibasilar atelectasis. Mild degradation by arm position, motion inferiorly. Mild cardiomegaly with multivessel coronary artery atherosclerosis. Hepatobiliary: EKG wire and lead artifact as well. Grossly normal noncontrast appearance of the liver, gallbladder. No biliary duct dilatation. Pancreas: Normal, without mass or ductal dilatation. Spleen: Normal in size, without focal abnormality. Adrenals/Urinary Tract: Normal adrenal glands. Bilateral punctate renal collecting system calculi, most apparent on coronal reformats. No hydronephrosis. Possible lower pole left renal 2.1 cm lesion on 45/2. No hydroureter or ureteric calculi. No bladder calculi. Bladder wall thickening is mild.  Stomach/Bowel: Apparent greater curvature gastric wall thickening at 2.7 cm on 26/2, likely at least partially artifactual in the setting of underdistention. Normal colon, appendix, and terminal ileum. Normal small bowel. Vascular/Lymphatic: Aortic atherosclerosis. No abdominopelvic adenopathy. Reproductive: Moderate prostatomegaly.  Urolift seeds identified. Other: No significant free fluid. Musculoskeletal: Pelvic sclerotic lesions including at up to 11 mm in the  left iliac are most likely bone islands. Degenerate disc disease at the lumbosacral junction is advanced. IMPRESSION: 1. Multifactorial degradation as detailed above. 2. Bilateral nephrolithiasis, without obstructive uropathy. 3. Mild bladder wall thickening which could be related to prostatomegaly/outlet obstruction or cystitis. 4. Status post urolift procedure. 5. Gastric underdistention.  Cannot exclude concurrent gastritis. 6. Coronary artery atherosclerosis. Aortic Atherosclerosis (ICD10-I70.0). 7. Equivocal lesion within the inter/lower pole left kidney. Consider nonemergent renal ultrasound versus more complete characterization with pre and post-contrast renal mass protocol CT. MRI likely not optimal secondary to patient age and comorbidities. Electronically Signed   By: Abigail Miyamoto M.D.   On: 05/27/2020 15:28        Scheduled Meds: . aspirin EC  81 mg Oral Daily  . atorvastatin  40 mg Oral Daily  . Chlorhexidine Gluconate Cloth  6 each Topical Daily  . cholecalciferol  1,000 Units Oral Daily  . docusate sodium  200 mg Oral BID  . enoxaparin (LOVENOX) injection  40 mg Subcutaneous Q24H  . ferrous sulfate  325 mg Oral Once per day on Mon Wed Fri  . insulin aspart  0-5 Units Subcutaneous QHS  . insulin aspart  0-9 Units Subcutaneous TID WC  . insulin aspart  3 Units Subcutaneous TID WC  . insulin glargine  15 Units Subcutaneous Daily  . lactulose  30 g Oral Daily  . mouth rinse  15 mL Mouth Rinse BID  . melatonin  5 mg Oral QHS   . multivitamin with minerals  1 tablet Oral Daily  . pantoprazole (PROTONIX) IV  40 mg Intravenous Q24H  . phenazopyridine  200 mg Oral TID WC  . polyethylene glycol  17 g Oral Daily  . QUEtiapine  50 mg Oral QHS  . ramipril  10 mg Oral QPM  . tamsulosin  0.4 mg Oral Daily   Continuous Infusions: . ceFEPime (MAXIPIME) IV 2 g (05/29/20 0543)     LOS: 2 days    Time spent: 36 mins     Wyvonnia Dusky, MD [facility] Pager [phone]  If 7PM-7AM, please contact night-coverage 05/29/2020, 2:22 PM

## 2020-05-29 NOTE — Progress Notes (Signed)
Urology Consult Follow Up  Subjective: Spent quite some time at the bedside with the patient and his wife around lunchtime today.  Quite agitated overnight complaining of bladder spasms and discomfort.  Hemodynamically stable, urine culture growing gram-negative rods.  Blood cultures negative.  Anti-infectives: Anti-infectives (From admission, onward)   Start     Dose/Rate Route Frequency Ordered Stop   05/28/20 1100  ceFEPIme (MAXIPIME) 2 g in sodium chloride 0.9 % 100 mL IVPB        2 g 200 mL/hr over 30 Minutes Intravenous Every 8 hours 05/28/20 0827     05/28/20 0300  ceFEPIme (MAXIPIME) 2 g in sodium chloride 0.9 % 100 mL IVPB  Status:  Discontinued        2 g 200 mL/hr over 30 Minutes Intravenous Every 12 hours 05/27/20 1902 05/28/20 0827   05/27/20 1830  ceFEPIme (MAXIPIME) 2 g in sodium chloride 0.9 % 100 mL IVPB  Status:  Discontinued        2 g 200 mL/hr over 30 Minutes Intravenous  Once 05/27/20 1825 05/27/20 1836   05/27/20 1500  ceFEPIme (MAXIPIME) 2 g in sodium chloride 0.9 % 100 mL IVPB        2 g 200 mL/hr over 30 Minutes Intravenous  Once 05/27/20 1452 05/27/20 1549   05/27/20 1430  cefTRIAXone (ROCEPHIN) 2 g in sodium chloride 0.9 % 100 mL IVPB  Status:  Discontinued        2 g 200 mL/hr over 30 Minutes Intravenous  Once 05/27/20 1427 05/27/20 1436       Objective: Vital signs in last 24 hours: Temp:  [97.4 F (36.3 C)-99.6 F (37.6 C)] 97.4 F (36.3 C) (03/24 0737) Pulse Rate:  [55-93] 67 (03/24 0737) Resp:  [15-23] 20 (03/24 0737) BP: (114-173)/(52-88) 146/71 (03/24 0737) SpO2:  [93 %-100 %] 100 % (03/24 0737)  Intake/Output from previous day: 03/23 0701 - 03/24 0700 In: 2174.7 [P.O.:600; I.V.:1373.3; IV Piggyback:201.5] Out: 2500 [Urine:2500] Intake/Output this shift: Total I/O In: -  Out: 400 [Urine:400]   Physical Exam  Lab Results:  Recent Labs    05/28/20 0435 05/29/20 0730  WBC 12.2* 9.0  HGB 12.7* 12.1*  HCT 36.4* 34.7*  PLT 201 198    BMET Recent Labs    05/28/20 0435 05/29/20 0730  NA 138 140  K 3.5 4.0  CL 106 108  CO2 24 26  GLUCOSE 211* 171*  BUN 11 10  CREATININE 0.75 0.86  CALCIUM 8.2* 8.4*     Assessment: 80 year old male with complicated UTI/urinary retention following UroLift procedure.  Overall improving although he does have some agitation likely related to delirium.  Plan: -Lengthy discussion today about risk and benefits of another voiding trial, ultimately elected to pursue this catheter removed oh 1:15 PM today. -Pyridium and Flomax added to help optimize voiding trial -If he is unable to void, prefer CIC as opposed replacing indwelling Foley catheter as this seems to be agitating him more if he tolerates it  Urology will continue to follow     LOS: 2 days    Hollice Espy 05/29/2020

## 2020-05-30 DIAGNOSIS — G9341 Metabolic encephalopathy: Secondary | ICD-10-CM

## 2020-05-30 LAB — CBC
HCT: 30.6 % — ABNORMAL LOW (ref 39.0–52.0)
Hemoglobin: 10.7 g/dL — ABNORMAL LOW (ref 13.0–17.0)
MCH: 30.7 pg (ref 26.0–34.0)
MCHC: 35 g/dL (ref 30.0–36.0)
MCV: 87.7 fL (ref 80.0–100.0)
Platelets: 203 10*3/uL (ref 150–400)
RBC: 3.49 MIL/uL — ABNORMAL LOW (ref 4.22–5.81)
RDW: 12.1 % (ref 11.5–15.5)
WBC: 7.8 10*3/uL (ref 4.0–10.5)
nRBC: 0 % (ref 0.0–0.2)

## 2020-05-30 LAB — URINE CULTURE: Culture: >=100000 — AB

## 2020-05-30 LAB — BASIC METABOLIC PANEL
Anion gap: 7 (ref 5–15)
BUN: 11 mg/dL (ref 8–23)
CO2: 25 mmol/L (ref 22–32)
Calcium: 8.5 mg/dL — ABNORMAL LOW (ref 8.9–10.3)
Chloride: 109 mmol/L (ref 98–111)
Creatinine, Ser: 0.82 mg/dL (ref 0.61–1.24)
GFR, Estimated: >60 mL/min (ref >60)
Glucose, Bld: 147 mg/dL — ABNORMAL HIGH (ref 70–99)
Potassium: 3.4 mmol/L — ABNORMAL LOW (ref 3.5–5.1)
Sodium: 141 mmol/L (ref 135–145)

## 2020-05-30 LAB — GLUCOSE, CAPILLARY
Glucose-Capillary: 151 mg/dL — ABNORMAL HIGH (ref 70–99)
Glucose-Capillary: 251 mg/dL — ABNORMAL HIGH (ref 70–99)

## 2020-05-30 MED ORDER — SILODOSIN 8 MG PO CAPS: 8.0000 mg | ORAL_CAPSULE | Freq: Every day | ORAL | 0 refills | Status: DC

## 2020-05-30 MED ORDER — OXYCODONE-ACETAMINOPHEN 7.5-325 MG PO TABS: 1.0000 | ORAL_TABLET | Freq: Four times a day (QID) | ORAL | 0 refills | Status: AC | PRN

## 2020-05-30 MED ORDER — CIPROFLOXACIN HCL 500 MG PO TABS: 500.0000 mg | ORAL_TABLET | Freq: Two times a day (BID) | ORAL | 0 refills | Status: DC

## 2020-05-30 MED ORDER — POTASSIUM CHLORIDE CRYS ER 20 MEQ PO TBCR
20.0000 meq | EXTENDED_RELEASE_TABLET | Freq: Once | ORAL | Status: AC
  Administered 2020-05-30: 20 meq via ORAL
  Filled 2020-05-30: qty 1

## 2020-05-30 MED ORDER — PANTOPRAZOLE SODIUM 40 MG PO TBEC: 40.0000 mg | DELAYED_RELEASE_TABLET | Freq: Every day | ORAL | Status: DC

## 2020-05-30 NOTE — TOC Initial Note (Signed)
Transition of Care [facility]) - Initial/Assessment Note    Patient Details  Name: Dustin Crane MRN: [id number] Date of Birth: 09/28/1940  Transition of Care [facility]) CM/SW Contact:    Pete Pelt, RN Phone Number: 05/30/2020, 10:58 AM  Clinical Narrative:   TOC in to see patient and spouse at bedside re: discharge planning, all amenable to discussion.  Plan is to discharge home with Home Health PT.  PT at bedside, verified plan.  [facility] notified, can accept patient.  Patient and family notified, amenable.  Patient has no concerns getting to appointments or obtaining medication or supplies, wife assists.  PT states no DME recommendations, as patient has rolling walker.  No further questions or concerns at this time, TOC contact information given.  Patient and spouse look forward to discharge home today, awaiting final orders.                  Barriers to Discharge: Barriers Resolved   Patient Goals and CMS Choice     Choice offered to / list presented to : NA  Expected Discharge Plan and Services                           DME Arranged:  (NO DME recommendations per PT)         HH Arranged: PT HH Agency: [facility] Date [place]: 05/30/20 Time [place]: 1100 Representative spoke with at [place]: Glennis Brink  Prior Living Arrangements/Services                       Activities of Daily Living Home Assistive Devices/Equipment: Dentures (specify type),Hearing aid,Eyeglasses ADL Screening (condition at time of admission) Patient's cognitive ability adequate to safely complete daily activities?: Yes Is the patient deaf or have difficulty hearing?: Yes (hearing aids) Does the patient have difficulty seeing, even when wearing glasses/contacts?: No Does the patient have difficulty concentrating, remembering, or making decisions?: No Patient able to express need for assistance with ADLs?: Yes Does the patient have  difficulty dressing or bathing?: No Independently performs ADLs?: Yes (appropriate for developmental age) Does the patient have difficulty walking or climbing stairs?: No Weakness of Legs: None Weakness of Arms/Hands: None  Permission Sought/Granted                  Emotional Assessment              Admission diagnosis:  Acidemia [E87.2] Lactic acidosis [E87.2] Encephalopathy [G93.40] Sepsis secondary to UTI ([place]) [A41.9, N39.0] Sepsis ([place]) [A41.9] Syncope, unspecified syncope type [R55] Patient Active Problem List   Diagnosis Date Noted  . Sepsis secondary to UTI ([place]) 05/27/2020  . Delirium 05/27/2020  . Seizure ([place]) 05/27/2020  . Hypokalemia 05/27/2020  . Hypomagnesemia 05/27/2020  . Anemia, unspecified 11/27/2018  . CAD (coronary artery disease) 11/27/2018  . Complete rupture of rotator cuff 11/27/2018  . Disorder of bursae and tendons in shoulder region 11/27/2018  . Neck pain 11/27/2018  . Osteoarthritis 11/27/2018  . Sinoatrial node dysfunction ([place]) 11/27/2018  . Carotid stenosis 11/27/2018  . Other chest pain 10/26/2017  . Acute pain of left knee 04/21/2017  . Gastroesophageal reflux disease without esophagitis 05/06/2016  . Diabetes mellitus type 2 with peripheral artery disease ([place]) 11/03/2015  . Mild mitral insufficiency 04/09/2015  . 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 Benign essential hypertension 07/26/2014  . Mixed hyperlipidemia 08/09/2013   PCP:  Doy Hutching,  Leonie Douglas, MD Pharmacy:   [id number] [address], [place] [address] Phone: [phone] Fax: [place], [place] [address] Phone: [phone] Fax: [phone]     Social Determinants of Health (SDOH) Interventions    Readmission Risk Interventions No flowsheet data found.

## 2020-05-30 NOTE — Discharge Summary (Signed)
Physician Discharge Summary  Dustin Crane WFU:[id number] DOB: 1940/08/23 DOA: 05/27/2020  PCP: Idelle Crouch, MD  Admit date: 05/27/2020 Discharge date: 05/30/2020  Admitted From: home Disposition:  home  Recommendations for Outpatient Follow-up:  1. Follow up with PCP in 1-2 weeks 2. F/u w/ urology, Dr. Bernardo Heater, in 1 week   Home Health: Equipment/Devices:  Discharge Condition: stable  CODE STATUS: full  Diet recommendation: Heart Healthy / Carb Modified  Brief/Interim Summary: HPI was taken from Dr. Francine Graven: Dustin Crane is a 80 y.o. male with medical history significant for hypertension, diabetes mellitus, coronary artery disease status post stent angioplasty, GERD, peripheral vascular disease and status post recent UroLift procedure which was done about 8 days prior to this hospitalization. Per patient's wife he had the procedure 8 days ago and after the procedure was unable to void and so was discharged home with a Foley catheter.  He was seen back at the urology clinic for follow-up and the Foley catheter was removed but had to be reinserted within a couple of hours due to recurrent urinary retention.  His wife states that he has continued to have severe pain in the suprapubic area without any relief with opioid medications that were prescribed.  She also states that they have alternated between Tylenol and ibuprofen without any improvement in his pain. He was seen again at the urology clinic 1 day prior to his admission and his Foley catheter was removed.  The patient's wife he was able to pull to during the day but started having issues at night where he had to strain and apply pressure to his bladder to be able to void.  He also developed severe pain which she rated a 10 x 10 in intensity at its worst and patient had moaned and groaned all night long.  Patient has had poor sleep due to the severe pain and getting up frequently to use the bathroom. She states the patient also  became confused over the last 24 hours and this has progressively worsened.  At baseline he is usually awake, alert and able to carry out all activities of daily living. She took the patient back to the urologist office on the day of admission and while waiting to be seen a rapid response was called because patient had what appeared to be a "seizure".  The wife, who is a retired Marine scientist that he suddenly became unresponsive and had jerking movements involving his upper and lower extremities.  Patient was said to have had a pulse when the rapid response team arrived, the AED was placed but no shock was advised.  Patient was immediately rushed to the emergency room for further evaluation. At the time of my exam he is awake and alert but is only oriented to person.  He is unable to provide any history and continues to writhe around the bed in pain.  Serial bladder scans were done with about 250 cc of urine in his bladder. Patient's pain appears to get worse when he attempts to void and he received a dose of BNO suppository in the ER. I am unable to do a review of systems on this patient due to his mental status. VBG 7.42/39/45/25.3/81.7 Sodium 138, potassium 3.3, chloride 98, bicarb 13, glucose 307, BUN 17, creatinine 1.13, calcium 9.4, magnesium 1.4, alkaline phosphatase 85, albumin 4.2, AST 43, ALT 22, total protein 7.9, lactic acid 6.0 >> 3.0, white count 21.2, hemoglobin 14.7, hematocrit 44, MCV 90.6, RDW 12.1, platelet count 284 CT  renal stone study shows bilateral nephrolithiasis, without obstructive uropathy.Mild bladder wall thickening which could be related to prostatomegaly/outlet obstruction or cystitis. Status post urolift procedure. Gastric underdistention. Cannot exclude concurrent gastritis.Coronary artery atherosclerosis. Aortic Atherosclerosis (ICD10-I70.0).Equivocal lesion within the inter/lower pole left kidney. Consider nonemergent renal ultrasound versus more complete characterization with  pre and post-contrast renal mass protocol CT. Chest x-ray reviewed by me shows no acute findings Twelve-lead EKG shows sinus tachycardia with PVCs    ED Course: Patient is a 80 year old male who was brought to the emergency room after an episode at the urologist office when patient suddenly became unresponsive and had jerking movements involving his upper and lower extremities suggestive of a possible seizure.  Patient is status post recent UroLift procedure and per his wife who provided most of the history has been in severe pain post procedure with recurrent episodes of urinary retention.  He appears to be septic from a urinary source and will be admitted to the hospital for further evaluation.  Hospital Course from Dr. Jimmye Norman 3/23-3/25/22: Pt presented w/ sepsis secondary to UTI. Urine cx grew citrobacter and was on IV cefepime while inpatient and will transition po cipro at d/c to complete the course. Of note, a foley was placed on admission for urinary retention. The foley was removed prior to d/c. Pt will f/u w/ urology in 1 week. Also, pt had significant pain and confusion from likely above/below stated infection. Pt's mental status was back to baseline prior to d/c. For more information, please see previous progress/consult notes.   Discharge Diagnoses:  Principal Problem:   Sepsis secondary to UTI [facility]) Active Problems:   Benign essential hypertension   CAD (coronary artery disease)   Diabetes mellitus type 2 with peripheral artery disease (HCC)   Gastroesophageal reflux disease without esophagitis   Delirium   Seizure (HCC)   Hypokalemia   Hypomagnesemia    Sepsis secondary to UTI: meets criteria w/ tachycardia, leukocytosis, elevated lactic acid & UTI. S/p recent urolift procedure. Continue on IV cefepime. Morphine, percocet prn for pain. Blood cx NGTD. Resolved  UTI: urine cx is growing gram neg rods, sens pending. Transition po cipro at d/c.   Urinary retention: continue  on pyridium & start silodosin as per uro.   Hypokalemia: KCl given   Leukocytosis: resolved   Delirium/metabolic encephalopathy: resolved  DM2: likely poorly controlled. Continue on lantus, SSI w/ accuchecks while inpatient. Continue to hold home dose of metformin   R/o new onset seizure: s/p keppra given as per neuro. EEG did not show any seizure activity. Neuro signed off    CAD: stable. Continue on aspirin, statin   HTN: continue on ramipril    Discharge Instructions  Discharge Instructions    Diet - low sodium heart healthy   Complete by: As directed    Diet Carb Modified   Complete by: As directed    Discharge instructions   Complete by: As directed    F/u PCP in 1-2 weeks. F/u urology, Dr. Bernardo Heater, in 1 week   Increase activity slowly   Complete by: As directed    No wound care   Complete by: As directed      Allergies as of 05/30/2020      Reactions   Haemophilus Influenzae Anaphylaxis, Shortness Of Breath, Rash   Rash-severe n/v/d Ended up in hospital after taking!   Hemophilus B Polysaccharide Vaccine Anaphylaxis, Shortness Of Breath, Rash   Rash-severe n/v/d   Influenza Vac Split [influenza Virus Vaccine] Shortness Of Breath,  Other (See Comments)   Rash-severe n/v/d   Hydroxyzine    Patient denies?????   Compazine [prochlorperazine Edisylate] Other (See Comments)   Muscle tightness      Medication List    STOP taking these medications   HYDROcodone-acetaminophen 5-325 MG tablet Commonly known as: NORCO/VICODIN   tamsulosin 0.4 MG Caps capsule Commonly known as: FLOMAX     TAKE these medications   aspirin 81 MG tablet Take 81 mg by mouth daily.   atorvastatin 40 MG tablet Commonly known as: LIPITOR Take 40 mg by mouth in the morning and at bedtime.   cholecalciferol 25 MCG (1000 UNIT) tablet Commonly known as: VITAMIN D3 Take 1,000 Units by mouth daily.   ciprofloxacin 500 MG tablet Commonly known as: Cipro Take 1 tablet (500 mg total)  by mouth 2 (two) times daily for 8 days.   ferrous sulfate 325 (65 FE) MG tablet Take 325 mg by mouth 3 (three) times a week.   Fish Oil 1200 MG Caps Take 1,200 mg by mouth daily.   glimepiride 4 MG tablet Commonly known as: AMARYL Take 4 mg by mouth 2 (two) times daily.   metFORMIN 500 MG tablet Commonly known as: GLUCOPHAGE Take 1,000 mg by mouth 2 (two) times daily with a meal.   multivitamin with minerals tablet Take 1 tablet by mouth daily.   omeprazole 20 MG capsule Commonly known as: PRILOSEC Take 20 mg by mouth 2 (two) times daily.   oxybutynin 5 MG tablet Commonly known as: DITROPAN Take 1 tablet (5 mg total) by mouth every 8 (eight) hours as needed for bladder spasms.   oxyCODONE-acetaminophen 7.5-325 MG tablet Commonly known as: PERCOCET Take 1 tablet by mouth every 6 (six) hours as needed for up to 3 days for moderate pain or severe pain.   pioglitazone 30 MG tablet Commonly known as: ACTOS Take 30 mg by mouth daily.   ramipril 10 MG capsule Commonly known as: ALTACE Take 10 mg by mouth every evening.   silodosin 8 MG Caps capsule Commonly known as: RAPAFLO Take 1 capsule (8 mg total) by mouth daily with breakfast for 7 days.   sitaGLIPtin 100 MG tablet Commonly known as: JANUVIA Take 100 mg by mouth daily.       Allergies  Allergen Reactions  . Haemophilus Influenzae Anaphylaxis, Shortness Of Breath and Rash    Rash-severe n/v/d Ended up in hospital after taking!   . Hemophilus B Polysaccharide Vaccine Anaphylaxis, Shortness Of Breath and Rash    Rash-severe n/v/d   . Influenza Vac Split [Influenza Virus Vaccine] Shortness Of Breath and Other (See Comments)    Rash-severe n/v/d  . Hydroxyzine     Patient denies?????  . Compazine [Prochlorperazine Edisylate] Other (See Comments)    Muscle tightness    Consultations:  Urology: PA Vaillancourt   Neuro: Bhagat   Procedures/Studies: CT Head Wo Contrast  Result Date:  05/27/2020 CLINICAL DATA:  Delirium, seizure-like activity EXAM: CT HEAD WITHOUT CONTRAST TECHNIQUE: Contiguous axial images were obtained from the base of the skull through the vertex without intravenous contrast. COMPARISON:  MRI 05/13/2020 FINDINGS: Brain: Normal anatomic configuration. Parenchymal volume loss is commensurate with the patient's age. 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 No abnormal intra or extra-axial mass lesion or fluid collection. No abnormal mass effect or midline shift. No evidence of acute intracranial hemorrhage or infarct. Ventricular size is normal. Cerebellum unremarkable. Vascular: No asymmetric hyperdense vasculature at the skull base. Skull: Intact Sinuses/Orbits:  There is dense opacification of the right frontal sinus with central hyperdensity suggesting chronic or fungal sinusitis again identified. Remaining paranasal sinuses are clear. Orbits are unremarkable. Other: Mastoid air cells and middle ear cavities are clear. IMPRESSION: No acute intracranial abnormality. Chronic right frontal sinus disease. Electronically Signed   By: Fidela Salisbury MD   On: 05/27/2020 14:09   MR BRAIN WO CONTRAST  Result Date: 05/13/2020 CLINICAL DATA:  Postural dizziness, ataxia. EXAM: MRI HEAD WITHOUT CONTRAST TECHNIQUE: Multiplanar, multiecho pulse sequences of the brain and surrounding structures were obtained without intravenous contrast. COMPARISON:  10/24/2018. FINDINGS: Brain: No diffusion-weighted signal abnormality. No intracranial hemorrhage. No midline shift, ventriculomegaly or extra-axial fluid collection. No mass lesion. Chronic insults involving the right centrum semiovale and left parietal cortex. Vascular: Normal flow voids. Skull and upper cervical spine: Normal marrow signal. Sinuses/Orbits: Normal orbits. Mild frontal, ethmoid and maxillary sinus mucosal thickening. No mastoid effusion. Other: None. IMPRESSION: No acute intracranial process.  Chronic right centrum semiovale and left parietal insults. Mild sinus disease. Electronically Signed   By: Primitivo Gauze M.D.   On: 05/13/2020 10:12   MR BRAIN W WO CONTRAST  Result Date: 05/27/2020 CLINICAL DATA:  Syncope EXAM: MRI HEAD WITHOUT AND WITH CONTRAST TECHNIQUE: Multiplanar, multiecho pulse sequences of the brain and surrounding structures were obtained without and with intravenous contrast. CONTRAST:  33mL GADAVIST GADOBUTROL 1 MMOL/ML IV SOLN COMPARISON:  None. FINDINGS: Brain: No acute infarct, mass effect or extra-axial collection. No acute or chronic hemorrhage. There is multifocal hyperintense T2-weighted signal within the white matter. Parenchymal volume and CSF spaces are normal. 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 The midline structures are normal. There is no abnormal contrast enhancement. Vascular: Major flow voids are preserved. Skull and upper cervical spine: Normal calvarium and skull base. Visualized upper cervical spine and soft tissues are normal. Sinuses/Orbits:Partial right frontal sinus opacification. Normal orbits. IMPRESSION: 1. No acute intracranial abnormality. 2. 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 Electronically Signed   By: Ulyses Jarred M.D.   On: 05/27/2020 23:54   US Carotid Bilateral  Result Date: 05/13/2020 CLINICAL DATA:  Dizziness, hypertension, hyperlipidemia and diabetes EXAM: BILATERAL CAROTID DUPLEX ULTRASOUND TECHNIQUE: Pearline Cables scale imaging, color Doppler and duplex ultrasound were performed of bilateral carotid and vertebral arteries in the neck. COMPARISON:  None. FINDINGS: Criteria: Quantification of carotid stenosis is based on velocity parameters that correlate the residual internal carotid diameter with NASCET-based stenosis levels, using the diameter of the distal internal carotid lumen as the denominator for stenosis  measurement. The following velocity measurements were obtained: RIGHT ICA: 118/31 cm/sec CCA: 36/14 cm/sec SYSTOLIC ICA/CCA RATIO:  1.6 ECA: 100 cm/sec LEFT ICA: 131/34 cm/sec CCA: 431/54 cm/sec SYSTOLIC ICA/CCA RATIO:  1.1 ECA: 184 cm/sec RIGHT CAROTID ARTERY: Mildly irregular echogenic shadowing plaque formation. No hemodynamically significant right ICA stenosis, velocity elevation, or turbulent flow. Degree of narrowing less than 50%. RIGHT VERTEBRAL ARTERY:  Normal antegrade flow LEFT CAROTID ARTERY: Similar mild echogenic plaque formation. No hemodynamically significant left ICA stenosis, velocity elevation, or turbulent flow. LEFT VERTEBRAL ARTERY:  Normal antegrade flow IMPRESSION: Mild bilateral carotid atherosclerosis. No hemodynamically significant ICA stenosis. Degree of narrowing less than 50% bilaterally by ultrasound criteria. Patent antegrade vertebral flow bilaterally Electronically Signed   By: Jerilynn Mages.  Shick M.D.   On: 05/13/2020 12:05   DG Chest Portable 1 View  Result Date: 05/27/2020 CLINICAL DATA:  Altered mental status.  Seizures. EXAM: PORTABLE CHEST 1 VIEW COMPARISON:  11/09/2006 FINDINGS: Poor inspiration. Extensive overlying artifact. Heart size upper limits of normal. Aortic atherosclerotic calcification is present. Increased markings at the lung bases may simply relate to the poor inspiration. Cannot left a degree of atelectasis or infiltrate. Upper lungs appear clear. No evidence of edema or effusions. No acute bone finding. Chronic postsurgical and degenerative changes of the right shoulder region. IMPRESSION: Increased markings at the lung bases may simply relate to the poor inspiration. Basilar atelectasis or infiltrate not excluded. Electronically Signed   By: Nelson Chimes M.D.   On: 05/27/2020 14:38   EEG adult  Result Date: 05/28/2020 Philemon Kingdom, MD     05/28/2020 11:41 AM Date of Study: 05/27/20 Reason for Study: Pt is a 7 YOM with PMH of HTN, HLD, DM II, CAD, GERD,  PVD who presented unresponsive and had jerking movements involving his upper and lower extremities suggestive of a possible seizure activity. Eval for seizures. Description of recording: This recording was obtained using a Digital EEG system with 18 channel capacity . Standard bipolar and referential EEG montages were used following the International  10 - 20  System. This is a digitally recorded EEG with duration of approximately 25 minutes. The background consists of 7-8 hz alpha rhythm which attenuates with eye opening and closure.  There were no epileptiform discharges, focal slowing, or recorded electrographic seizures.  Hyperventilation was not performed. Photic stimulation was performed and it did not elicit any electrographic seizures. Stage II sleep did not occur in this recording.  EKG shows normal sinus rhythm throughout the study. IMPRESSION: This is a normal EEG for age. There were no clinical seizures or epileptiform discharges noted during study. CLINICAL CORRELATION: This EEG finding did not support the diagnosis of seizure. Clinical correlation is advised. A normal EEG does not rule out epilepsy, which is a clinical diagnosis. I have personally reviewed this study.   CT Renal Stone Study  Result Date: 05/27/2020 CLINICAL DATA:  Dysuria.  Penile pain. EXAM: CT ABDOMEN AND PELVIS WITHOUT CONTRAST TECHNIQUE: Multidetector CT imaging of the abdomen and pelvis was performed following the standard protocol without IV contrast. COMPARISON:  08/06/2009 abdominal ultrasound. No prior CT. FINDINGS: Lower chest: Bibasilar atelectasis. Mild degradation by arm position, motion inferiorly. Mild cardiomegaly with multivessel coronary artery atherosclerosis. Hepatobiliary: EKG wire and lead artifact as well. Grossly normal noncontrast appearance of the liver, gallbladder. No biliary duct dilatation. Pancreas: Normal, without mass or ductal dilatation. Spleen: Normal in size, without focal abnormality.  Adrenals/Urinary Tract: Normal adrenal glands. Bilateral punctate renal collecting system calculi, most apparent on coronal reformats. No hydronephrosis. Possible lower pole left renal 2.1 cm lesion on 45/2. No hydroureter or ureteric calculi. No bladder calculi. Bladder wall thickening is mild. Stomach/Bowel: Apparent greater curvature gastric wall thickening at 2.7 cm on 26/2, likely at least partially artifactual in the setting of underdistention. Normal colon, appendix, and terminal ileum. Normal small bowel. Vascular/Lymphatic: Aortic atherosclerosis. No abdominopelvic adenopathy. Reproductive: Moderate prostatomegaly.  Urolift seeds identified. Other: No significant free fluid. Musculoskeletal: Pelvic sclerotic lesions including at up to 11 mm in the left iliac are most likely bone islands. Degenerate disc disease at the lumbosacral junction is advanced. IMPRESSION: 1. Multifactorial degradation as detailed above. 2. Bilateral nephrolithiasis, without obstructive uropathy. 3. Mild bladder wall thickening which could be related to prostatomegaly/outlet obstruction or cystitis. 4. Status post urolift procedure. 5. Gastric underdistention.  Cannot exclude concurrent gastritis. 6. Coronary artery  atherosclerosis. Aortic Atherosclerosis (ICD10-I70.0). 7. Equivocal lesion within the inter/lower pole left kidney. Consider nonemergent renal ultrasound versus more complete characterization with pre and post-contrast renal mass protocol CT. MRI likely not optimal secondary to patient age and comorbidities. Electronically Signed   By: Abigail Miyamoto M.D.   On: 05/27/2020 15:28      Subjective: Pt denies any bladder or penis pain today    Discharge Exam: Vitals:   05/30/20 0727 05/30/20 1127  BP: (!) 151/67 (!) 149/84  Pulse: 60 72  Resp: 17 16  Temp: 98.6 F (37 C) 98.2 F (36.8 C)  SpO2: 92% 92%   Vitals:   05/29/20 2027 05/30/20 0422 05/30/20 0727 05/30/20 1127  BP: (!) 120/57 (!) 92/47 (!) 151/67  (!) 149/84  Pulse: 81 61 60 72  Resp: 17 16 17 16   Temp: 98.5 F (36.9 C) 98.2 F (36.8 C) 98.6 F (37 C) 98.2 F (36.8 C)  TempSrc:      SpO2: 99% 93% 92% 92%  Weight:      Height:        General: Pt is alert, awake, not in acute distress Cardiovascular: S1/S2 +, no rubs, no gallops Respiratory: CTA bilaterally, no wheezing, no rhonchi Abdominal: Soft, NT, ND, bowel sounds + Extremities: no edema, no cyanosis    The results of significant diagnostics from this hospitalization (including imaging, microbiology, ancillary and laboratory) are listed below for reference.     Microbiology: Recent Results (from the past 240 hour(s))  Urine culture     Status: Abnormal   Collection Time: 05/27/20  1:18 PM   Specimen: Urine, Random  Result Value Ref Range Status   Specimen Description   Final    URINE, RANDOM Performed at [facility], [address] [place], [place] [address]    Special Requests   Final    NONE Performed at [facility], [address] [place], [place] [address]    Culture >=100,000 COLONIES/mL CITROBACTER FREUNDII (A)  Final   Report Status 05/30/2020 FINAL  Final   Organism ID, Bacteria CITROBACTER FREUNDII (A)  Final      Susceptibility   Citrobacter freundii - MIC*    CEFAZOLIN >=64 RESISTANT Resistant     CEFEPIME <=0.12 SENSITIVE Sensitive     CEFTRIAXONE <=0.25 SENSITIVE Sensitive     CIPROFLOXACIN <=0.25 SENSITIVE Sensitive     GENTAMICIN <=1 SENSITIVE Sensitive     IMIPENEM <=0.25 SENSITIVE Sensitive     NITROFURANTOIN <=16 SENSITIVE Sensitive     TRIMETH/SULFA <=20 SENSITIVE Sensitive     PIP/TAZO <=4 SENSITIVE Sensitive     * >=100,000 COLONIES/mL CITROBACTER FREUNDII  Blood culture (routine x 2)     Status: None (Preliminary result)   Collection Time: 05/27/20  2:41 PM   Specimen: BLOOD  Result Value Ref Range Status   Specimen Description BLOOD LEFT ANTECUBITAL  Final   Special Requests   Final    BOTTLES DRAWN  AEROBIC AND ANAEROBIC Blood Culture results may not be optimal due to an excessive volume of blood received in culture bottles   Culture   Final    NO GROWTH 3 DAYS Performed at [facility], [address] [place], [place] [address]    Report Status PENDING  Incomplete  Blood culture (routine x 2)     Status: None (Preliminary result)   Collection Time: 05/27/20  3:12 PM   Specimen: BLOOD  Result Value Ref Range Status   Specimen Description BLOOD LEFT  ANTECUBITAL  Final   Special Requests   Final    BOTTLES DRAWN AEROBIC AND ANAEROBIC Blood Culture adequate volume   Culture   Final    NO GROWTH 3 DAYS Performed at [facility], [address] [place], [place] [address]    Report Status PENDING  Incomplete  Resp Panel by RT-PCR (Flu A&B, Covid) Nasopharyngeal Swab     Status: None   Collection Time: 05/27/20  4:42 PM   Specimen: Nasopharyngeal Swab; Nasopharyngeal(NP) swabs in vial transport medium  Result Value Ref Range Status   SARS Coronavirus 2 by RT PCR NEGATIVE NEGATIVE Final    Comment: (NOTE) SARS-CoV-2 target nucleic acids are NOT DETECTED.  The SARS-CoV-2 RNA is generally detectable in upper respiratory specimens during the acute phase of infection. The lowest concentration of SARS-CoV-2 viral copies this assay can detect is 138 copies/mL. A negative result does not preclude SARS-Cov-2 infection and should not be used as the sole basis for treatment or other patient management decisions. A negative result may occur with  improper specimen collection/handling, submission of specimen other than nasopharyngeal swab, presence of viral mutation(s) within the areas targeted by this assay, and inadequate number of viral copies(<138 copies/mL). A negative result must be combined with clinical observations, patient history, and epidemiological information. The expected result is Negative.  Fact Sheet for Patients:   EntrepreneurPulse.com.au  Fact Sheet for Healthcare Providers:  IncredibleEmployment.be  This test is no t yet approved or cleared by the [place] FDA and  has been authorized for detection and/or diagnosis of SARS-CoV-2 by FDA under an Emergency Use Authorization (EUA). This EUA will remain  in effect (meaning this test can be used) for the duration of the COVID-19 declaration under Section 564(b)(1) of the Act, 21 U.S.C.section 360bbb-3(b)(1), unless the authorization is terminated  or revoked sooner.       Influenza A by PCR NEGATIVE NEGATIVE Final   Influenza B by PCR NEGATIVE NEGATIVE Final    Comment: (NOTE) The Xpert Xpress SARS-CoV-2/FLU/RSV plus assay is intended as an aid in the diagnosis of influenza from Nasopharyngeal swab specimens and should not be used as a sole basis for treatment. Nasal washings and aspirates are unacceptable for Xpert Xpress SARS-CoV-2/FLU/RSV testing.  Fact Sheet for Patients: EntrepreneurPulse.com.au  Fact Sheet for Healthcare Providers: IncredibleEmployment.be  This test is not yet approved or cleared by the [place] FDA and has been authorized for detection and/or diagnosis of SARS-CoV-2 by FDA under an Emergency Use Authorization (EUA). This EUA will remain in effect (meaning this test can be used) for the duration of the COVID-19 declaration under Section 564(b)(1) of the Act, 21 U.S.C. section 360bbb-3(b)(1), unless the authorization is terminated or revoked.  Performed at [facility], [address] [place], [place] [address]   MRSA PCR Screening     Status: None   Collection Time: 05/27/20  7:52 PM   Specimen: Nasopharyngeal  Result Value Ref Range Status   MRSA by PCR NEGATIVE NEGATIVE Final    Comment:        The GeneXpert MRSA Assay (FDA approved for NASAL specimens only), is one component of a comprehensive MRSA  colonization surveillance program. It is not intended to diagnose MRSA infection nor to guide or monitor treatment for MRSA infections. Performed at [facility], [address] [place], [place] [address]      Labs: BNP (last 3 results) No results for input(s): BNP in the last 8760 hours. Basic Metabolic Panel:  Recent Labs  Lab 05/27/20 1318 05/28/20 0435 05/29/20 0730 05/30/20 0511  NA 138 138 140 141  K 3.3* 3.5 4.0 3.4*  CL 98 106 108 109  CO2 13* 24 26 25   GLUCOSE 307* 211* 171* 147*  BUN 17 11 10 11   CREATININE 1.13 0.75 0.86 0.82  CALCIUM 9.4 8.2* 8.4* 8.5*  MG 1.4*  --   --   --    Liver Function Tests: Recent Labs  Lab 05/27/20 1318  AST 43*  ALT 22  ALKPHOS 85  BILITOT 1.6*  PROT 7.9  ALBUMIN 4.2   No results for input(s): LIPASE, AMYLASE in the last 168 hours. No results for input(s): AMMONIA in the last 168 hours. CBC: Recent Labs  Lab 05/27/20 1318 05/28/20 0435 05/29/20 0730 05/30/20 0511  WBC 21.2* 12.2* 9.0 7.8  NEUTROABS 14.5*  --   --   --   HGB 14.7 12.7* 12.1* 10.7*  HCT 44.4 36.4* 34.7* 30.6*  MCV 90.6 87.1 87.6 87.7  PLT 284 201 198 203   Cardiac Enzymes: Recent Labs  Lab 05/27/20 1642 05/28/20 0435  CKTOTAL 270 377   BNP: Invalid input(s): POCBNP CBG: Recent Labs  Lab 05/29/20 0733 05/29/20 1201 05/29/20 1753 05/29/20 2134 05/30/20 0724  GLUCAP 166* 171* 289* 232* 151*   D-Dimer No results for input(s): DDIMER in the last 72 hours. Hgb A1c Recent Labs    05/27/20 1843  HGBA1C 8.1*   Lipid Profile No results for input(s): CHOL, HDL, LDLCALC, TRIG, CHOLHDL, LDLDIRECT in the last 72 hours. Thyroid function studies No results for input(s): TSH, T4TOTAL, T3FREE, THYROIDAB in the last 72 hours.  Invalid input(s): FREET3 Anemia work up Recent Labs    05/29/20 0734  VITAMINB12 223   Urinalysis    Component Value Date/Time   COLORURINE YELLOW (A) 05/27/2020 1318   APPEARANCEUR HAZY (A)  05/27/2020 1318   APPEARANCEUR Hazy (A) 05/09/2020 1056   LABSPEC 1.010 05/27/2020 1318   PHURINE 6.0 05/27/2020 1318   GLUCOSEU 50 (A) 05/27/2020 1318   HGBUR LARGE (A) 05/27/2020 1318   BILIRUBINUR NEGATIVE 05/27/2020 1318   BILIRUBINUR Negative 05/09/2020 1056   KETONESUR 5 (A) 05/27/2020 1318   PROTEINUR 100 (A) 05/27/2020 1318   NITRITE POSITIVE (A) 05/27/2020 1318   LEUKOCYTESUR MODERATE (A) 05/27/2020 1318   Sepsis Labs Invalid input(s): PROCALCITONIN,  WBC,  LACTICIDVEN Microbiology Recent Results (from the past 240 hour(s))  Urine culture     Status: Abnormal   Collection Time: 05/27/20  1:18 PM   Specimen: Urine, Random  Result Value Ref Range Status   Specimen Description   Final    URINE, RANDOM Performed at [facility], [address] [place], [place] [address]    Special Requests   Final    NONE Performed at [facility], [address] [place], [place] [address]    Culture >=100,000 COLONIES/mL CITROBACTER FREUNDII (A)  Final   Report Status 05/30/2020 FINAL  Final   Organism ID, Bacteria CITROBACTER FREUNDII (A)  Final      Susceptibility   Citrobacter freundii - MIC*    CEFAZOLIN >=64 RESISTANT Resistant     CEFEPIME <=0.12 SENSITIVE Sensitive     CEFTRIAXONE <=0.25 SENSITIVE Sensitive     CIPROFLOXACIN <=0.25 SENSITIVE Sensitive     GENTAMICIN <=1 SENSITIVE Sensitive     IMIPENEM <=0.25 SENSITIVE Sensitive     NITROFURANTOIN <=16 SENSITIVE Sensitive     TRIMETH/SULFA <=20 SENSITIVE Sensitive  PIP/TAZO <=4 SENSITIVE Sensitive     * >=100,000 COLONIES/mL CITROBACTER FREUNDII  Blood culture (routine x 2)     Status: None (Preliminary result)   Collection Time: 05/27/20  2:41 PM   Specimen: BLOOD  Result Value Ref Range Status   Specimen Description BLOOD LEFT ANTECUBITAL  Final   Special Requests   Final    BOTTLES DRAWN AEROBIC AND ANAEROBIC Blood Culture results may not be optimal due to an excessive volume of blood received  in culture bottles   Culture   Final    NO GROWTH 3 DAYS Performed at [facility], [address], [place], [place] [address]    Report Status PENDING  Incomplete  Blood culture (routine x 2)     Status: None (Preliminary result)   Collection Time: 05/27/20  3:12 PM   Specimen: BLOOD  Result Value Ref Range Status   Specimen Description BLOOD LEFT ANTECUBITAL  Final   Special Requests   Final    BOTTLES DRAWN AEROBIC AND ANAEROBIC Blood Culture adequate volume   Culture   Final    NO GROWTH 3 DAYS Performed at [facility], [address], [place],  [address]    Report Status PENDING  Incomplete  Resp Panel by RT-PCR (Flu A&B, Covid) Nasopharyngeal Swab     Status: None   Collection Time: 05/27/20  4:42 PM   Specimen: Nasopharyngeal Swab; Nasopharyngeal(NP) swabs in vial transport medium  Result Value Ref Range Status   SARS Coronavirus 2 by RT PCR NEGATIVE NEGATIVE Final    Comment: (NOTE) SARS-CoV-2 target nucleic acids are NOT DETECTED.  The SARS-CoV-2 RNA is generally detectable in upper respiratory specimens during the acute phase of infection. The lowest concentration of SARS-CoV-2 viral copies this assay can detect is 138 copies/mL. A negative result does not preclude SARS-Cov-2 infection and should not be used as the sole basis for treatment or other patient management decisions. A negative result may occur with  improper specimen collection/handling, submission of specimen other than nasopharyngeal swab, presence of viral mutation(s) within the areas targeted by this assay, and inadequate number of viral copies(<138 copies/mL). A negative result must be combined with clinical observations, patient history, and epidemiological information. The expected result is Negative.  Fact Sheet for Patients:  EntrepreneurPulse.com.au  Fact Sheet for Healthcare Providers:  IncredibleEmployment.be  This test is no  t yet approved or cleared by the [place] FDA and  has been authorized for detection and/or diagnosis of SARS-CoV-2 by FDA under an Emergency Use Authorization (EUA). This EUA will remain  in effect (meaning this test can be used) for the duration of the COVID-19 declaration under Section 564(b)(1) of the Act, 21 U.S.C.section 360bbb-3(b)(1), unless the authorization is terminated  or revoked sooner.       Influenza A by PCR NEGATIVE NEGATIVE Final   Influenza B by PCR NEGATIVE NEGATIVE Final    Comment: (NOTE) The Xpert Xpress SARS-CoV-2/FLU/RSV plus assay is intended as an aid in the diagnosis of influenza from Nasopharyngeal swab specimens and should not be used as a sole basis for treatment. Nasal washings and aspirates are unacceptable for Xpert Xpress SARS-CoV-2/FLU/RSV testing.  Fact Sheet for Patients: EntrepreneurPulse.com.au  Fact Sheet for Healthcare Providers: IncredibleEmployment.be  This test is not yet approved or cleared by the [place] FDA and has been authorized for detection and/or diagnosis of SARS-CoV-2 by FDA under an Emergency Use Authorization (EUA). This EUA will remain in effect (meaning this test can be  used) for the duration of the COVID-19 declaration under Section 564(b)(1) of the Act, 21 U.S.C. section 360bbb-3(b)(1), unless the authorization is terminated or revoked.  Performed at [facility], [address] [place], [place] [address]   MRSA PCR Screening     Status: None   Collection Time: 05/27/20  7:52 PM   Specimen: Nasopharyngeal  Result Value Ref Range Status   MRSA by PCR NEGATIVE NEGATIVE Final    Comment:        The GeneXpert MRSA Assay (FDA approved for NASAL specimens only), is one component of a comprehensive MRSA colonization surveillance program. It is not intended to diagnose MRSA infection nor to guide or monitor treatment for MRSA infections. Performed at  [facility], [address] [place], [place] [address]      Time coordinating discharge: Over 30 minutes  SIGNED:   Wyvonnia Dusky, MD  [facility] 05/30/2020, 12:16 PM Pager   If 7PM-7AM, please contact night-coverage [email or web address]

## 2020-05-30 NOTE — Progress Notes (Addendum)
Urology Inpatient Progress Note  Subjective: No acute events overnight. White count down today, 7.8.  Blood cultures pending with no growth at 3 days.  Urine culture finalized with cefazolin resistant Citrobacter freundii.  On antibiotics as below. Patient reports resolution of his "agony."  He is voiding spontaneously and notes a strong stream.  He has some mild dysuria.  Overall, he states he feels well and would like to go home.  Wife reports concerns with deconditioning and requests PT consult. Patient denies dizziness or lightheadedness since restarting Flomax yesterday.  Interestingly, patient's wife reports he has a history of dizziness with postural changes while on tamsulosin that was worked up by his PCP with an MR brain early this month.  Anti-infectives: Anti-infectives (From admission, onward)   Start     Dose/Rate Route Frequency Ordered Stop   05/28/20 1100  ceFEPIme (MAXIPIME) 2 g in sodium chloride 0.9 % 100 mL IVPB        2 g 200 mL/hr over 30 Minutes Intravenous Every 8 hours 05/28/20 0827     05/28/20 0300  ceFEPIme (MAXIPIME) 2 g in sodium chloride 0.9 % 100 mL IVPB  Status:  Discontinued        2 g 200 mL/hr over 30 Minutes Intravenous Every 12 hours 05/27/20 1902 05/28/20 0827   05/27/20 1830  ceFEPIme (MAXIPIME) 2 g in sodium chloride 0.9 % 100 mL IVPB  Status:  Discontinued        2 g 200 mL/hr over 30 Minutes Intravenous  Once 05/27/20 1825 05/27/20 1836   05/27/20 1500  ceFEPIme (MAXIPIME) 2 g in sodium chloride 0.9 % 100 mL IVPB        2 g 200 mL/hr over 30 Minutes Intravenous  Once 05/27/20 1452 05/27/20 1549   05/27/20 1430  cefTRIAXone (ROCEPHIN) 2 g in sodium chloride 0.9 % 100 mL IVPB  Status:  Discontinued        2 g 200 mL/hr over 30 Minutes Intravenous  Once 05/27/20 1427 05/27/20 1436      Current Facility-Administered Medications  Medication Dose Route Frequency Provider Last Rate Last Admin  . aspirin EC tablet 81 mg  81 mg Oral Daily Agbata,  Tochukwu, MD   81 mg at 05/29/20 1051  . atorvastatin (LIPITOR) tablet 40 mg  40 mg Oral Daily Agbata, Tochukwu, MD   40 mg at 05/29/20 1051  . belladonna-opium (B&O) suppository 16.2-60mg   1 suppository Rectal Q8H PRN Agbata, Tochukwu, MD   1 suppository at 05/29/20 0312  . ceFEPIme (MAXIPIME) 2 g in sodium chloride 0.9 % 100 mL IVPB  2 g Intravenous Q8H Deatra Robinson B, RPH 200 mL/hr at 05/30/20 0528 2 g at 05/30/20 0528  . Chlorhexidine Gluconate Cloth 2 % PADS 6 each  6 each Topical Daily Agbata, Tochukwu, MD   6 each at 05/29/20 1057  . cholecalciferol (VITAMIN D3) tablet 1,000 Units  1,000 Units Oral Daily Agbata, Tochukwu, MD   1,000 Units at 05/29/20 1052  . docusate sodium (COLACE) capsule 200 mg  200 mg Oral BID Wyvonnia Dusky, MD   200 mg at 05/29/20 2155  . enoxaparin (LOVENOX) injection 40 mg  40 mg Subcutaneous Q24H Agbata, Tochukwu, MD   40 mg at 05/29/20 2157  . ferrous sulfate tablet 325 mg  325 mg Oral Once per day on Mon Wed Fri Collier Bullock, MD   325 mg at 05/28/20 0758  . insulin aspart (novoLOG) injection 0-5 Units  0-5 Units Subcutaneous QHS Jimmye Norman,  August Saucer, MD   2 Units at 05/29/20 2156  . insulin aspart (novoLOG) injection 0-9 Units  0-9 Units Subcutaneous TID WC Wyvonnia Dusky, MD   2 Units at 05/30/20 [phone]  . insulin aspart (novoLOG) injection 3 Units  3 Units Subcutaneous TID WC Wyvonnia Dusky, MD   3 Units at 05/30/20 [phone]  . insulin glargine (LANTUS) injection 15 Units  15 Units Subcutaneous Daily Wyvonnia Dusky, MD   15 Units at 05/29/20 1053  . lactulose (CHRONULAC) 10 GM/15ML solution 30 g  30 g Oral Daily Wyvonnia Dusky, MD      . LORazepam (ATIVAN) injection 2 mg  2 mg Intravenous Q2H PRN Agbata, Tochukwu, MD      . MEDLINE mouth rinse  15 mL Mouth Rinse BID Agbata, Tochukwu, MD   15 mL at 05/29/20 2156  . melatonin tablet 5 mg  5 mg Oral QHS Bhagat, Srishti L, MD   5 mg at 05/29/20 2155  . morphine 2 MG/ML injection 2 mg  2 mg  Intravenous Q4H PRN Agbata, Tochukwu, MD   2 mg at 05/29/20 0230  . multivitamin with minerals tablet 1 tablet  1 tablet Oral Daily Agbata, Tochukwu, MD   1 tablet at 05/29/20 1051  . ondansetron (ZOFRAN) tablet 4 mg  4 mg Oral Q6H PRN Agbata, Tochukwu, MD       Or  . ondansetron (ZOFRAN) injection 4 mg  4 mg Intravenous Q6H PRN Agbata, Tochukwu, MD      . oxybutynin (DITROPAN) tablet 5 mg  5 mg Oral Q8H PRN Agbata, Tochukwu, MD   5 mg at 05/29/20 1356  . oxyCODONE-acetaminophen (PERCOCET) 7.5-325 MG per tablet 1 tablet  1 tablet Oral Q4H PRN Wyvonnia Dusky, MD   1 tablet at 05/29/20 2154  . pantoprazole (PROTONIX) injection 40 mg  40 mg Intravenous Q24H Athena Masse, MD   40 mg at 05/29/20 2156  . phenazopyridine (PYRIDIUM) tablet 200 mg  200 mg Oral TID WC Hollice Espy, MD   200 mg at 05/29/20 1750  . polyethylene glycol (MIRALAX / GLYCOLAX) packet 17 g  17 g Oral Daily Eppie Gibson M, MD      . potassium chloride SA (KLOR-CON) CR tablet 20 mEq  20 mEq Oral Once Wyvonnia Dusky, MD      . QUEtiapine (SEROQUEL) tablet 50 mg  50 mg Oral QHS Bhagat, Srishti L, MD   50 mg at 05/29/20 2156  . ramipril (ALTACE) capsule 10 mg  10 mg Oral QPM Wyvonnia Dusky, MD   10 mg at 05/29/20 1750  . tamsulosin (FLOMAX) capsule 0.4 mg  0.4 mg Oral Daily Hollice Espy, MD   0.4 mg at 05/29/20 1356  . traZODone (DESYREL) tablet 50 mg  50 mg Oral QHS PRN Sharion Settler, NP   50 mg at 05/28/20 2140   Objective: Vital signs in last 24 hours: Temp:  [98.2 F (36.8 C)-98.6 F (37 C)] 98.6 F (37 C) (03/25 0727) Pulse Rate:  [60-81] 60 (03/25 0727) Resp:  [15-17] 17 (03/25 0727) BP: (92-151)/(47-80) 151/67 (03/25 0727) SpO2:  [92 %-99 %] 92 % (03/25 0727)  Intake/Output from previous day: 03/24 0701 - 03/25 0700 In: 480 [P.O.:480] Out: 400 [Urine:400] Intake/Output this shift: No intake/output data recorded.  Physical Exam Vitals and nursing note reviewed.  Constitutional:       General: He is not in acute distress.    Appearance: He is not ill-appearing, toxic-appearing or  diaphoretic.  HENT:     Head: Normocephalic and atraumatic.  Pulmonary:     Effort: Pulmonary effort is normal. No respiratory distress.  Skin:    General: Skin is warm and dry.  Neurological:     Mental Status: He is alert.  Psychiatric:        Mood and Affect: Mood normal.        Behavior: Behavior normal.    Lab Results:  Recent Labs    05/29/20 0730 05/30/20 0511  WBC 9.0 7.8  HGB 12.1* 10.7*  HCT 34.7* 30.6*  PLT 198 203   BMET Recent Labs    05/29/20 0730 05/30/20 0511  NA 140 141  K 4.0 3.4*  CL 108 109  CO2 26 25  GLUCOSE 171* 147*  BUN 10 11  CREATININE 0.86 0.82  CALCIUM 8.4* 8.5*   PT/INR Recent Labs    05/28/20 0435  LABPROT 13.7  INR 1.1   ABG Recent Labs    05/27/20 1429  HCO3 25.3   Assessment & Plan: 80 year old male with complicated UTI/urinary retention following UroLift procedure.  Patient continues to clinically improve today with resolution of his severe bladder pain and is voiding spontaneously on Flomax.  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  Recommend switching to Rapaflo for increased alpha-1A selectivity.  I explained to the patient that he will only need to be on alpha blockers temporarily in the postoperative period, given his previous concerns for retrograde ejaculation on these medications.  I also explained that his mild dysuria is a normal postoperative finding after UroLift and should resolve within 2 to 4 weeks of surgery.  Okay for discharge from the urologic perspective.  He will require a total of 10 to 14 days of culture appropriate therapy, recommend Bactrim or Cipro for maximum tissue penetration.  We will plan for outpatient follow-up with bladder scan next week.  If his bladder scan is WNL at that time, okay to cautiously discontinue alpha blockers with close  follow-up.  Agree with PT assessment per patient request.  Debroah Loop, PA-C 05/30/2020

## 2020-05-30 NOTE — Care Management Important Message (Signed)
Important Message  Patient Details  Name: Dustin Crane MRN: [id number] Date of Birth: [birth year]   Medicare Important Message Given:  Yes     Juliann Pulse A Chrystina Naff 05/30/2020, 9:50 AM

## 2020-05-30 NOTE — Plan of Care (Signed)
Pt discharged home per order at this time.All discharge instructions, education and medications reviewed with patient and significant other.F/u appointments also communicated to Pt.Patient expressed understanding and will comply with MD orders/dc instructions.Pt transported by family in a private car.no verbal c/o or any ssx of distress on discharge.

## 2020-05-30 NOTE — Evaluation (Signed)
Physical Therapy Evaluation Patient Details Name: Dustin Crane MRN: [id number] DOB: 1940/12/31 Today's Date: 05/30/2020   History of Present Illness  Dustin Crane is a 80 y.o. male diagnosed with sepsis from UTI after urolift procedure on 05/19/2020. Spouse returned pt next day to urology for suprapubic pain and increasing confusion. At urology visit pt displayed seizure like activity bringing pt to the ED.  PMH: significant for hypertension, hyperlipidemia, diabetes, coronary artery disease s/p stents (2004), skin cancer, hearing loss requiring hearing aids, BPH.   Clinical Impression  Pt admitted with above diagnosis. Pt alert and oriented to self, place, and time (year). Supine in bed with spouse and is agreeable to PT services. Pt reports urgency to urinate and due to need, he is impulsive. With mod verbal cuing for IV pole and RW set up, pt was mod-I with supine to sitting EOB and then STS with RW requiring minguard in bathroom for safety while pt voided urine. Able to transfer mod-I to sit onto commode to pass BM with use of railing for sitting. Pt able to independently perform toileting hygiene and STS with min VC for use of rail for safety. Pt ambulated with PT supervision and RW 250'. Pt reports he is ambulating at his baseline speed prior to admission, however did not require the RW for balance. Pt displayed ability to asc/desc single step with RW with VC for sequencing and minguard for safety and IV pole management. Pt returned to room and educated on exercises to perform once pt returns home. See exercises below. Pt currently with functional limitations due to the deficits listed below (see PT Problem List). Pt will benefit from skilled PT to increase their independence and safety with mobility to allow discharge to the venue listed below.      Follow Up Recommendations Home health PT;Supervision for mobility/OOB    Equipment Recommendations  None recommended by PT     Recommendations for Other Services       Precautions / Restrictions Precautions Precautions: Fall Precaution Comments: Impulsive Restrictions Weight Bearing Restrictions: No      Mobility  Bed Mobility Overal bed mobility: Independent                  Transfers Overall transfer level: Modified independent Equipment used: Rolling walker (2 wheeled)                Ambulation/Gait Ambulation/Gait assistance: Modified independent (Device/Increase time) Gait Distance (Feet): 250 Feet Assistive device: Rolling walker (2 wheeled);IV Pole Gait Pattern/deviations: WFL(Within Functional Limits)     General Gait Details: Pt reports he is walking his baseline speed prior to admission, however was not requiring use of the RW  Stairs            Wheelchair Mobility    Modified Rankin (Stroke Patients Only)       Balance Overall balance assessment: Modified Independent                                           Pertinent Vitals/Pain Pain Assessment: No/denies pain Pain Score: 0-No pain    Home Living Family/patient expects to be discharged to:: Private residence Living Arrangements: Spouse/significant other Available Help at Discharge: Family Type of Home: House Home Access: Stairs to enter Entrance Stairs-Rails: None Entrance Stairs-Number of Steps: single step Home Layout: Two level;Able to live on main level with bedroom/bathroom Home  Equipment: Gilford Rile - 2 wheels;Shower seat - built in;Cane - quad;Cane - single point;Toilet riser;Grab bars - tub/shower      Prior Function Level of Independence: Independent         Comments: Intermittent use of AD     Hand Dominance   Dominant Hand: Right    Extremity/Trunk Assessment   Upper Extremity Assessment Upper Extremity Assessment: Defer to OT evaluation    Lower Extremity Assessment Lower Extremity Assessment: Overall WFL for tasks assessed;Generalized weakness     Cervical / Trunk Assessment Cervical / Trunk Assessment: Normal  Communication   Communication: No difficulties  Cognition Arousal/Alertness: Awake/alert Behavior During Therapy: WFL for tasks assessed/performed Overall Cognitive Status: Within Functional Limits for tasks assessed                                 General Comments: Required extra time for memory of questions asked. Alert and oriented to self, place (hospital), year. Wife cues for reason he was hospitalized      General Comments General comments (skin integrity, edema, etc.): use of RW for maintaining balance    Exercises General Exercises - Lower Extremity Ankle Circles/Pumps: AROM;Both;20 reps;Seated Gluteal Sets: Seated;Strengthening;Both;10 reps Long Arc Quad: AROM;Seated;Strengthening;Both;10 reps Hip Flexion/Marching: Standing;Both;10 reps (with RW and supervision) Heel Raises: AROM;Seated;Strengthening;Both;10 reps Other Exercises Other Exercises: Seated adductor squeeze with pillow; 1x10, 3 sec isometric hold Other Exercises: Case management in room during PT session.   Assessment/Plan    PT Assessment All further PT needs can be met in the next venue of care  PT Problem List Decreased strength;Decreased knowledge of use of DME;Decreased activity tolerance;Decreased mobility       PT Treatment Interventions      PT Goals (Current goals can be found in the Care Plan section)  Acute Rehab PT Goals Patient Stated Goal: Go home and get stronger PT Goal Formulation: With patient/family Time For Goal Achievement: 06/01/20 Potential to Achieve Goals: Good    Frequency     Barriers to discharge        Co-evaluation               AM-PAC PT "6 Clicks" Mobility  Outcome Measure Help needed turning from your back to your side while in a flat bed without using bedrails?: None Help needed moving from lying on your back to sitting on the side of a flat bed without using bedrails?: A  Little Help needed moving to and from a bed to a chair (including a wheelchair)?: A Little Help needed standing up from a chair using your arms (e.g., wheelchair or bedside chair)?: A Little Help needed to walk in hospital room?: A Little Help needed climbing 3-5 steps with a railing? : A Little 6 Click Score: 19    End of Session Equipment Utilized During Treatment: Gait belt Activity Tolerance: Patient tolerated treatment well Patient left: in bed;with call bell/phone within reach;with bed alarm set;with family/visitor present Nurse Communication: Mobility status (nurse tech) PT Visit Diagnosis: Unsteadiness on feet (R26.81);Other abnormalities of gait and mobility (R26.89);Muscle weakness (generalized) (M62.81)    Time: 8119-1478 PT Time Calculation (min) (ACUTE ONLY): 44 min   Charges:   PT Evaluation $PT Eval Low Complexity: 1 Low PT Treatments $Therapeutic Exercise: 23-37 mins        Averly Ericson M. Fairly IV, PT, DPT Physical Therapist- [facility]    05/30/2020, 12:08 PM

## 2020-05-30 NOTE — Progress Notes (Signed)
Inpatient Diabetes Program Recommendations  AACE/ADA: New Consensus Statement on Inpatient Glycemic Control  Target Ranges:  Prepandial:   less than 140 mg/dL      Peak postprandial:   less than 180 mg/dL (1-2 hours)      Critically ill patients:  140 - 180 mg/dL   Results for AADIN, GAUT (MRN [id number]) as of 05/30/2020 10:44  Ref. Range 05/29/2020 07:33 05/29/2020 12:01 05/29/2020 17:53 05/29/2020 21:34 05/30/2020 07:24  Glucose-Capillary Latest Ref Range: 70 - 99 mg/dL 166 (H) 171 (H) 289 (H) 232 (H) 151 (H)   Review of Glycemic Control  Diabetes history: DM2 Outpatient Diabetes medications: Amaryl 4 mg BID, Metformin 1000 mg BID, Actos 30 mg daily, Januvia 100 mg daily Current orders for Inpatient glycemic control: Lantus 15 units daily, Novolog 0-9 units TID with meals, Novolog 0-5 units QHS, Novolog 3 units TID with meals  Inpatient Diabetes Program Recommendations:    Insulin: Please consider increasing meal coverage to Novolog 5 units TID with meals.  Thanks, Barnie Alderman, RN, MSN, CDE Diabetes Coordinator Inpatient Diabetes Program [phone] (Team Pager from 8am to 5pm)

## 2020-06-01 LAB — CULTURE, BLOOD (ROUTINE X 2)
Culture: NO GROWTH
Culture: NO GROWTH
Special Requests: ADEQUATE

## 2020-06-03 NOTE — Progress Notes (Deleted)
06/04/2020 11:22 AM   Dustin Crane May 13, 1940 [id number]  Referring provider: Idelle Crouch, MD [facility] [place],  [place] [id number]  No chief complaint on file.  Urological history: 1. Elevated PSA - PSA Trend 08/02/2013: 2.45 08/16/2014: 2.09 08/11/2015: 2.32 08/20/2016: 3.23 08/17/2017: 4.48 01/16/2018: 4.82 07/14/2018: 3.27 09/13/2019: 4.02 Component     Latest Ref Rng & Units 10/03/2019  Prostate Specific Ag, Serum     0.0 - 4.0 ng/mL 3.6   2. 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 ED  4. Retrograde ejaculation  HPI: Dustin Crane is a 80 y.o. male who presents today for PVR with his wife, Dustin Crane.   Patient went into post-operative retention after his UroLift.  He was recently admitted due to sepsis with UTI.     PMH: Past Medical History:  Diagnosis Date  . Arthritis   . Cancer ([place])    skin cancer with tags removed  . Coronary artery disease   . Diabetes mellitus without complication ([place])   . GERD (gastroesophageal reflux disease)   . Hyperlipemia   . Hypertension   . Myocardial infarction [facility]) 2004   2 stents  . Peripheral vascular disease ([place])   . TIA (transient ischemic attack) 10/2018   PATIENT DENIES THIS DIAGNOSIS  . Wears glasses   . Wears hearing aid    both ears    Surgical History: Past Surgical History:  Procedure Laterality Date  . ACHILLES TENDON SURGERY Right 12/07/2018   Procedure: ACHILLES TENDON REPAIR SECONDARY;  Surgeon: Albertine Patricia, DPM;  Location: [place];  Service: Podiatry;  Laterality: Right;  LMA LOCAL DIABETIC   block  . CALCANEAL OSTEOTOMY Right 12/07/2018   Procedure: PARTIAL CALCANECTOMY RIGHT;  Surgeon: Albertine Patricia, DPM;  Location: [place];  Service: Podiatry;  Laterality: Right;  . CARDIAC CATHETERIZATION  2005   stents x2  . CATARACT EXTRACTION W/PHACO Right 04/22/2020   Procedure: CATARACT  EXTRACTION PHACO AND INTRAOCULAR LENS PLACEMENT (IOC) RIGHT DIABETIC 3.65 00:41.9;  Surgeon: Birder Robson, MD;  Location: [place];  Service: Ophthalmology;  Laterality: Right;  . CATARACT EXTRACTION W/PHACO Left 05/06/2020   Procedure: CATARACT EXTRACTION PHACO AND INTRAOCULAR LENS PLACEMENT ([place]) LEFT DIABETIC MYALUGIN;  Surgeon: Birder Robson, MD;  Location: [place];  Service: Ophthalmology;  Laterality: Left;  Dexycu 9%  LOT [id number]  EXP 10/05/2020 given @ 0757 inj OS  8.30 1:12.4  . COLONOSCOPY    . COLONOSCOPY WITH PROPOFOL N/A 04/03/2020   Procedure: COLONOSCOPY WITH PROPOFOL;  Surgeon: [name], MD;  Location[facility];  Service: Gastroenterology;  Laterality: N/A;  . CYSTOSCOPY WITH INSERTION OF UROLIFT N/A 05/19/2020   Procedure: CYSTOSCOPY WITH INSERTION OF UROLIFT;  Surgeon: Hollice Espy, MD;  Location: [facility];  Service: Urology;  Laterality: N/A;  . FINGER ARTHROPLASTY Right 12/05/2012   Procedure: RIGHT INDEX FINGER IMPLANT ARTHROPLASTY;  Surgeon: Cammie Sickle., MD;  Location: [place];  Service: Orthopedics;  Laterality: Right;  . HERNIA REPAIR  1990   rt ing   . QUADRICEPS TENDON REPAIR Left 02/11/2017   Procedure: REPAIR QUADRICEP TENDON;  Surgeon: Leim Fabry, MD;  Location: [facility];  Service: Orthopedics;  Laterality: Left;  . SHOULDER ACROMIOPLASTY  1967   [place]   . SHOULDER ARTHROSCOPY  2012   right  . TONSILLECTOMY      Home Medications:  Allergies  as of 06/04/2020      Reactions   Haemophilus Influenzae Anaphylaxis, Shortness Of Breath, Rash   Rash-severe n/v/d Ended up in hospital after taking!   Hemophilus B Polysaccharide Vaccine Anaphylaxis, Shortness Of Breath, Rash   Rash-severe n/v/d   Influenza Vac Split [influenza Virus Vaccine] Shortness Of Breath, Other (See Comments)   Rash-severe n/v/d   Hydroxyzine    Patient denies?????   Compazine [prochlorperazine Edisylate] Other  (See Comments)   Muscle tightness      Medication List       Accurate as of June 03, 2020 11:22 AM. If you have any questions, ask your nurse or doctor.        aspirin 81 MG tablet Take 81 mg by mouth daily.   atorvastatin 40 MG tablet Commonly known as: LIPITOR Take 40 mg by mouth in the morning and at bedtime.   cholecalciferol 25 MCG (1000 UNIT) tablet Commonly known as: VITAMIN D3 Take 1,000 Units by mouth daily.   ciprofloxacin 500 MG tablet Commonly known as: Cipro Take 1 tablet (500 mg total) by mouth 2 (two) times daily for 8 days.   ferrous sulfate 325 (65 FE) MG tablet Take 325 mg by mouth 3 (three) times a week.   Fish Oil 1200 MG Caps Take 1,200 mg by mouth daily.   glimepiride 4 MG tablet Commonly known as: AMARYL Take 4 mg by mouth 2 (two) times daily.   metFORMIN 500 MG tablet Commonly known as: GLUCOPHAGE Take 1,000 mg by mouth 2 (two) times daily with a meal.   multivitamin with minerals tablet Take 1 tablet by mouth daily.   omeprazole 20 MG capsule Commonly known as: PRILOSEC Take 20 mg by mouth 2 (two) times daily.   oxybutynin 5 MG tablet Commonly known as: DITROPAN Take 1 tablet (5 mg total) by mouth every 8 (eight) hours as needed for bladder spasms.   pioglitazone 30 MG tablet Commonly known as: ACTOS Take 30 mg by mouth daily.   ramipril 10 MG capsule Commonly known as: ALTACE Take 10 mg by mouth every evening.   silodosin 8 MG Caps capsule Commonly known as: RAPAFLO Take 1 capsule (8 mg total) by mouth daily with breakfast for 7 days.   sitaGLIPtin 100 MG tablet Commonly known as: JANUVIA Take 100 mg by mouth daily.       Allergies:  Allergies  Allergen Reactions  . Haemophilus Influenzae Anaphylaxis, Shortness Of Breath and Rash    Rash-severe n/v/d Ended up in hospital after taking!   . Hemophilus B Polysaccharide Vaccine Anaphylaxis, Shortness Of Breath and Rash    Rash-severe n/v/d   . Influenza Vac Split  [Influenza Virus Vaccine] Shortness Of Breath and Other (See Comments)    Rash-severe n/v/d  . Hydroxyzine     Patient denies?????  . Compazine [Prochlorperazine Edisylate] Other (See Comments)    Muscle tightness    Family History: Family History  Problem Relation Age of Onset  . Coronary artery disease Mother   . Diabetes Mother   . Alzheimer's disease Mother   . Heart disease Father   . Diabetes Father     Social History:  reports that he quit smoking about 44 years ago. He has never used smokeless tobacco. He reports current alcohol use. He reports that he does not use drugs.  ROS: Pertinent ROS in HPI  Physical Exam: There were no vitals taken for this visit. Constitutional:  Well nourished. Alert and oriented, No acute distress. HEENT: [place]  AT, moist mucus membranes.  Trachea midline Cardiovascular: No clubbing, cyanosis, or edema. Respiratory: Normal respiratory effort, no increased work of breathing. GI: Abdomen is soft, non tender, non distended, no abdominal masses. Liver and spleen not palpable.  No hernias appreciated.  Stool sample for occult testing is not indicated.   GU: No CVA tenderness.  No bladder fullness or masses.  Patient with circumcised/uncircumcised phallus. ***Foreskin easily retracted***  Urethral meatus is patent.  No penile discharge. No penile lesions or rashes. Scrotum without lesions, cysts, rashes and/or edema.  Testicles are located scrotally bilaterally. No masses are appreciated in the testicles. Left and right epididymis are normal. Rectal: Patient with  normal sphincter tone. Anus and perineum without scarring or rashes. No rectal masses are appreciated. Prostate is approximately *** grams, *** nodules are appreciated. Seminal vesicles are normal. Skin: No rashes, bruises or suspicious lesions. Lymph: No inguinal adenopathy. Neurologic: Grossly intact, no focal deficits, moving all 4 extremities. Psychiatric: Normal mood and  affect.  Laboratory Data: Component     Latest Ref Rng & Units 05/30/2020  Sodium     135 - 145 mmol/L 141  Potassium     3.5 - 5.1 mmol/L 3.4 (L)  Chloride     98 - 111 mmol/L 109  CO2     22 - 32 mmol/L 25  Glucose     70 - 99 mg/dL 147 (H)  BUN     8 - 23 mg/dL 11  Creatinine     0.61 - 1.24 mg/dL 0.82  Calcium     8.9 - 10.3 mg/dL 8.5 (L)  GFR, Estimated     >60 mL/min >60  Anion gap     5 - 15 7   Component     Latest Ref Rng & Units 05/30/2020  WBC     4.0 - 10.5 K/uL 7.8  RBC     4.22 - 5.81 MIL/uL 3.49 (L)  Hemoglobin     13.0 - 17.0 g/dL 10.7 (L)  HCT     39.0 - 52.0 % 30.6 (L)  MCV     80.0 - 100.0 fL 87.7  MCH     26.0 - 34.0 pg 30.7  MCHC     30.0 - 36.0 g/dL 35.0  RDW     11.5 - 15.5 % 12.1  Platelets     150 - 400 K/uL 203  nRBC     0.0 - 0.2 % 0.0  Neutrophils     %   NEUT#     1.7 - 7.7 K/uL   Lymphocytes     %   Lymphocyte #     0.7 - 4.0 K/uL   Monocytes Relative     %   Monocyte #     0.1 - 1.0 K/uL   Eosinophil     %   Eosinophils Absolute     0.0 - 0.5 K/uL   Basophil     %   Basophils Absolute     0.0 - 0.1 K/uL   Immature Granulocytes     %   Abs Immature Granulocytes     0.00 - 0.07 K/uL   WBC, UA     0 - 5 /hpf   Epithelial Cells (non renal)     0 - 10 /hpf   Casts     None seen /lpf   Cast Type     N/A   Bacteria, UA     None seen/Few  I have reviewed the labs.  Pertinent Imaging: CLINICAL DATA:  Dysuria.  Penile pain.  EXAM: CT ABDOMEN AND PELVIS WITHOUT CONTRAST  TECHNIQUE: Multidetector CT imaging of the abdomen and pelvis was performed following the standard protocol without IV contrast.  COMPARISON:  08/06/2009 abdominal ultrasound. No prior CT.  FINDINGS: Lower chest: Bibasilar atelectasis. Mild degradation by arm position, motion inferiorly. Mild cardiomegaly with multivessel coronary artery atherosclerosis.  Hepatobiliary: EKG wire and lead artifact as well. Grossly  normal noncontrast appearance of the liver, gallbladder. No biliary duct dilatation.  Pancreas: Normal, without mass or ductal dilatation.  Spleen: Normal in size, without focal abnormality.  Adrenals/Urinary Tract: Normal adrenal glands. Bilateral punctate renal collecting system calculi, most apparent on coronal reformats.  No hydronephrosis. Possible lower pole left renal 2.1 cm lesion on 45/2. No hydroureter or ureteric calculi. No bladder calculi. Bladder wall thickening is mild.  Stomach/Bowel: Apparent greater curvature gastric wall thickening at 2.7 cm on 26/2, likely at least partially artifactual in the setting of underdistention. Normal colon, appendix, and terminal ileum. Normal small bowel.  Vascular/Lymphatic: Aortic atherosclerosis. No abdominopelvic adenopathy.  Reproductive: Moderate prostatomegaly.  Urolift seeds identified.  Other: No significant free fluid.  Musculoskeletal: Pelvic sclerotic lesions including at up to 11 mm in the left iliac are most likely bone islands. Degenerate disc disease at the lumbosacral junction is advanced.  IMPRESSION: 1. Multifactorial degradation as detailed above. 2. Bilateral nephrolithiasis, without obstructive uropathy. 3. Mild bladder wall thickening which could be related to prostatomegaly/outlet obstruction or cystitis. 4. Status post urolift procedure. 5. Gastric underdistention.  Cannot exclude concurrent gastritis. 6. Coronary artery atherosclerosis. Aortic Atherosclerosis (ICD10-I70.0). 7. Equivocal lesion within the inter/lower pole left Crane. Consider nonemergent renal ultrasound versus more complete characterization with pre and post-contrast renal mass protocol CT. MRI likely not optimal secondary to patient age and comorbidities.   Electronically Signed   By: Abigail Miyamoto M.D.   On: 05/27/2020 15:28 I have independently reviewed the films.  See HPI.   Assessment & Plan:    1. BPH  with LU TS - s/p UroLift   2. Left renal mass ***   No follow-ups on file.  These notes generated with voice recognition software. I apologize for typographical errors.  Zara Council, PA-C  [facility] [address]  [place], [place] [address] [phone]

## 2020-06-04 ENCOUNTER — Ambulatory Visit: Payer: Medicare Other | Admitting: Urology

## 2020-06-04 ENCOUNTER — Other Ambulatory Visit: Payer: Self-pay

## 2020-06-04 ENCOUNTER — Emergency Department: Payer: Medicare Other

## 2020-06-04 ENCOUNTER — Inpatient Hospital Stay: Payer: Medicare Other

## 2020-06-04 ENCOUNTER — Inpatient Hospital Stay
Admission: EM | Admit: 2020-06-04 | Discharge: 2020-06-09 | DRG: 871 | Disposition: A | Discharge status: Home Health | Payer: Medicare Other | Attending: Hospitalist | Admitting: Hospitalist

## 2020-06-04 ENCOUNTER — Telehealth: Payer: Self-pay

## 2020-06-04 DIAGNOSIS — F05 Delirium due to known physiological condition: Secondary | ICD-10-CM | POA: Diagnosis not present

## 2020-06-04 DIAGNOSIS — E1151 Type 2 diabetes mellitus with diabetic peripheral angiopathy without gangrene: Secondary | ICD-10-CM | POA: Diagnosis present

## 2020-06-04 DIAGNOSIS — R000 Tachycardia, unspecified: Secondary | ICD-10-CM | POA: Diagnosis present

## 2020-06-04 DIAGNOSIS — Z7984 Long term (current) use of oral hypoglycemic drugs: Secondary | ICD-10-CM

## 2020-06-04 DIAGNOSIS — N390 Urinary tract infection, site not specified: Secondary | ICD-10-CM | POA: Diagnosis present

## 2020-06-04 DIAGNOSIS — E785 Hyperlipidemia, unspecified: Secondary | ICD-10-CM | POA: Diagnosis present

## 2020-06-04 DIAGNOSIS — E1165 Type 2 diabetes mellitus with hyperglycemia: Secondary | ICD-10-CM | POA: Diagnosis present

## 2020-06-04 DIAGNOSIS — R569 Unspecified convulsions: Secondary | ICD-10-CM | POA: Diagnosis present

## 2020-06-04 DIAGNOSIS — G9341 Metabolic encephalopathy: Secondary | ICD-10-CM | POA: Diagnosis present

## 2020-06-04 DIAGNOSIS — Z955 Presence of coronary angioplasty implant and graft: Secondary | ICD-10-CM | POA: Diagnosis not present

## 2020-06-04 DIAGNOSIS — Z887 Allergy status to serum and vaccine status: Secondary | ICD-10-CM | POA: Diagnosis not present

## 2020-06-04 DIAGNOSIS — N138 Other obstructive and reflux uropathy: Secondary | ICD-10-CM

## 2020-06-04 DIAGNOSIS — N401 Enlarged prostate with lower urinary tract symptoms: Secondary | ICD-10-CM | POA: Diagnosis present

## 2020-06-04 DIAGNOSIS — I252 Old myocardial infarction: Secondary | ICD-10-CM | POA: Diagnosis not present

## 2020-06-04 DIAGNOSIS — Z7982 Long term (current) use of aspirin: Secondary | ICD-10-CM

## 2020-06-04 DIAGNOSIS — I10 Essential (primary) hypertension: Secondary | ICD-10-CM | POA: Diagnosis not present

## 2020-06-04 DIAGNOSIS — Z79899 Other long term (current) drug therapy: Secondary | ICD-10-CM

## 2020-06-04 DIAGNOSIS — R4182 Altered mental status, unspecified: Secondary | ICD-10-CM

## 2020-06-04 DIAGNOSIS — K219 Gastro-esophageal reflux disease without esophagitis: Secondary | ICD-10-CM | POA: Diagnosis present

## 2020-06-04 DIAGNOSIS — Z9109 Other allergy status, other than to drugs and biological substances: Secondary | ICD-10-CM

## 2020-06-04 DIAGNOSIS — Z85828 Personal history of other malignant neoplasm of skin: Secondary | ICD-10-CM

## 2020-06-04 DIAGNOSIS — E876 Hypokalemia: Secondary | ICD-10-CM | POA: Diagnosis present

## 2020-06-04 DIAGNOSIS — I25118 Atherosclerotic heart disease of native coronary artery with other forms of angina pectoris: Secondary | ICD-10-CM | POA: Diagnosis not present

## 2020-06-04 DIAGNOSIS — I2510 Atherosclerotic heart disease of native coronary artery without angina pectoris: Secondary | ICD-10-CM | POA: Diagnosis present

## 2020-06-04 DIAGNOSIS — Z8673 Personal history of transient ischemic attack (TIA), and cerebral infarction without residual deficits: Secondary | ICD-10-CM | POA: Diagnosis not present

## 2020-06-04 DIAGNOSIS — Z87891 Personal history of nicotine dependence: Secondary | ICD-10-CM

## 2020-06-04 DIAGNOSIS — Z9889 Other specified postprocedural states: Secondary | ICD-10-CM | POA: Diagnosis not present

## 2020-06-04 DIAGNOSIS — M1990 Unspecified osteoarthritis, unspecified site: Secondary | ICD-10-CM | POA: Diagnosis present

## 2020-06-04 DIAGNOSIS — Z833 Family history of diabetes mellitus: Secondary | ICD-10-CM

## 2020-06-04 DIAGNOSIS — Z8249 Family history of ischemic heart disease and other diseases of the circulatory system: Secondary | ICD-10-CM

## 2020-06-04 DIAGNOSIS — A419 Sepsis, unspecified organism: Secondary | ICD-10-CM | POA: Diagnosis not present

## 2020-06-04 DIAGNOSIS — Z820 Family history of epilepsy and other diseases of the nervous system: Secondary | ICD-10-CM

## 2020-06-04 DIAGNOSIS — Z20822 Contact with and (suspected) exposure to covid-19: Secondary | ICD-10-CM | POA: Diagnosis present

## 2020-06-04 DIAGNOSIS — N419 Inflammatory disease of prostate, unspecified: Secondary | ICD-10-CM | POA: Diagnosis present

## 2020-06-04 DIAGNOSIS — Z974 Presence of external hearing-aid: Secondary | ICD-10-CM

## 2020-06-04 LAB — URINALYSIS, COMPLETE (UACMP) WITH MICROSCOPIC
Bacteria, UA: NONE SEEN
Bilirubin Urine: NEGATIVE
Glucose, UA: 150 mg/dL — AB
Hgb urine dipstick: NEGATIVE
Ketones, ur: 5 mg/dL — AB
Leukocytes,Ua: NEGATIVE
Nitrite: POSITIVE — AB
Protein, ur: 100 mg/dL — AB
Specific Gravity, Urine: 1.017 (ref 1.005–1.030)
Squamous Epithelial / HPF: NONE SEEN (ref 0–5)
pH: 6 (ref 5.0–8.0)

## 2020-06-04 LAB — COMPREHENSIVE METABOLIC PANEL
ALT: 30 U/L (ref 0–44)
AST: 26 U/L (ref 15–41)
Albumin: 4.2 g/dL (ref 3.5–5.0)
Alkaline Phosphatase: 84 U/L (ref 38–126)
Anion gap: 12 (ref 5–15)
BUN: 14 mg/dL (ref 8–23)
CO2: 26 mmol/L (ref 22–32)
Calcium: 10 mg/dL (ref 8.9–10.3)
Chloride: 97 mmol/L — ABNORMAL LOW (ref 98–111)
Creatinine, Ser: 0.95 mg/dL (ref 0.61–1.24)
GFR, Estimated: >60 mL/min (ref >60)
Glucose, Bld: 307 mg/dL — ABNORMAL HIGH (ref 70–99)
Potassium: 4.4 mmol/L (ref 3.5–5.1)
Sodium: 135 mmol/L (ref 135–145)
Total Bilirubin: 1 mg/dL (ref 0.3–1.2)
Total Protein: 7.8 g/dL (ref 6.5–8.1)

## 2020-06-04 LAB — PROTIME-INR
INR: 1.1 (ref 0.8–1.2)
Prothrombin Time: 13.4 seconds (ref 11.4–15.2)

## 2020-06-04 LAB — RESP PANEL BY RT-PCR (FLU A&B, COVID) ARPGX2
Influenza A by PCR: NEGATIVE
Influenza B by PCR: NEGATIVE
SARS Coronavirus 2 by RT PCR: NEGATIVE

## 2020-06-04 LAB — CBC WITH DIFFERENTIAL/PLATELET
Abs Immature Granulocytes: 0.11 10*3/uL — ABNORMAL HIGH (ref 0.00–0.07)
Basophils Absolute: 0.1 10*3/uL (ref 0.0–0.1)
Basophils Relative: 1 %
Eosinophils Absolute: 0 10*3/uL (ref 0.0–0.5)
Eosinophils Relative: 0 %
HCT: 39.9 % (ref 39.0–52.0)
Hemoglobin: 14 g/dL (ref 13.0–17.0)
Immature Granulocytes: 1 %
Lymphocytes Relative: 8 %
Lymphs Abs: 1.1 10*3/uL (ref 0.7–4.0)
MCH: 30.4 pg (ref 26.0–34.0)
MCHC: 35.1 g/dL (ref 30.0–36.0)
MCV: 86.6 fL (ref 80.0–100.0)
Monocytes Absolute: 0.5 10*3/uL (ref 0.1–1.0)
Monocytes Relative: 4 %
Neutro Abs: 11.2 10*3/uL — ABNORMAL HIGH (ref 1.7–7.7)
Neutrophils Relative %: 86 %
Platelets: 302 10*3/uL (ref 150–400)
RBC: 4.61 MIL/uL (ref 4.22–5.81)
RDW: 11.9 % (ref 11.5–15.5)
WBC: 13 10*3/uL — ABNORMAL HIGH (ref 4.0–10.5)
nRBC: 0 % (ref 0.0–0.2)

## 2020-06-04 LAB — LACTIC ACID, PLASMA
Lactic Acid, Venous: 2.3 mmol/L (ref 0.5–1.9)
Lactic Acid, Venous: 2.1 mmol/L (ref 0.5–1.9)
Lactic Acid, Venous: 2 mmol/L (ref 0.5–1.9)

## 2020-06-04 LAB — APTT: aPTT: 26 seconds (ref 24–36)

## 2020-06-04 LAB — CBG MONITORING, ED
Glucose-Capillary: 246 mg/dL — ABNORMAL HIGH (ref 70–99)
Glucose-Capillary: 129 mg/dL — ABNORMAL HIGH (ref 70–99)
Glucose-Capillary: 147 mg/dL — ABNORMAL HIGH (ref 70–99)

## 2020-06-04 MED ORDER — SODIUM CHLORIDE 0.9 % IV BOLUS (SEPSIS)
1000.0000 mL | Freq: Once | INTRAVENOUS | Status: AC
  Administered 2020-06-04: 1000 mL via INTRAVENOUS

## 2020-06-04 MED ORDER — TAMSULOSIN HCL 0.4 MG PO CAPS
0.4000 mg | ORAL_CAPSULE | Freq: Every day | ORAL | Status: DC
  Administered 2020-06-05 – 2020-06-09 (×5): 0.4 mg via ORAL
  Filled 2020-06-04 (×5): qty 1

## 2020-06-04 MED ORDER — ACETAMINOPHEN 325 MG PO TABS
650.0000 mg | ORAL_TABLET | Freq: Four times a day (QID) | ORAL | Status: DC | PRN
  Administered 2020-06-04 – 2020-06-09 (×3): 650 mg via ORAL
  Filled 2020-06-04 (×3): qty 2

## 2020-06-04 MED ORDER — VITAMIN D 25 MCG (1000 UNIT) PO TABS
1000.0000 [IU] | ORAL_TABLET | Freq: Every day | ORAL | Status: DC
  Administered 2020-06-05 – 2020-06-09 (×5): 1000 [IU] via ORAL
  Filled 2020-06-04 (×5): qty 1

## 2020-06-04 MED ORDER — ENOXAPARIN SODIUM 40 MG/0.4ML ~~LOC~~ SOLN
40.0000 mg | SUBCUTANEOUS | Status: DC
  Administered 2020-06-04 – 2020-06-06 (×3): 40 mg via SUBCUTANEOUS
  Filled 2020-06-04 (×4): qty 0.4

## 2020-06-04 MED ORDER — LACTATED RINGERS IV SOLN: INTRAVENOUS | Status: AC

## 2020-06-04 MED ORDER — SODIUM CHLORIDE 0.9 % IV SOLN
2.0000 g | Freq: Once | INTRAVENOUS | Status: AC
  Administered 2020-06-04: 2 g via INTRAVENOUS
  Filled 2020-06-04: qty 2

## 2020-06-04 MED ORDER — RAMIPRIL 10 MG PO CAPS
10.0000 mg | ORAL_CAPSULE | Freq: Every evening | ORAL | Status: DC
Start: 2020-06-04 — End: 2020-06-09
  Administered 2020-06-04 – 2020-06-08 (×5): 10 mg via ORAL
  Filled 2020-06-04 (×7): qty 1

## 2020-06-04 MED ORDER — SODIUM CHLORIDE 0.9 % IV SOLN
2.0000 g | Freq: Three times a day (TID) | INTRAVENOUS | Status: DC
  Administered 2020-06-04 – 2020-06-09 (×14): 2 g via INTRAVENOUS
  Filled 2020-06-04 (×16): qty 2

## 2020-06-04 MED ORDER — ONDANSETRON HCL 4 MG/2ML IJ SOLN: 4.0000 mg | Freq: Four times a day (QID) | INTRAMUSCULAR | Status: DC | PRN

## 2020-06-04 MED ORDER — FERROUS SULFATE 325 (65 FE) MG PO TABS
325.0000 mg | ORAL_TABLET | ORAL | Status: DC
  Administered 2020-06-06: 325 mg via ORAL
  Filled 2020-06-04 (×2): qty 1

## 2020-06-04 MED ORDER — ONDANSETRON HCL 4 MG PO TABS: 4.0000 mg | ORAL_TABLET | Freq: Four times a day (QID) | ORAL | Status: DC | PRN

## 2020-06-04 MED ORDER — INSULIN ASPART 100 UNIT/ML ~~LOC~~ SOLN
0.0000 [IU] | SUBCUTANEOUS | Status: DC
  Administered 2020-06-04 – 2020-06-05 (×2): 5 [IU] via SUBCUTANEOUS
  Administered 2020-06-05: 3 [IU] via SUBCUTANEOUS
  Filled 2020-06-04 (×3): qty 1

## 2020-06-04 MED ORDER — VANCOMYCIN HCL IN DEXTROSE 1-5 GM/200ML-% IV SOLN
1000.0000 mg | Freq: Once | INTRAVENOUS | Status: AC
  Administered 2020-06-04: 1000 mg via INTRAVENOUS
  Filled 2020-06-04: qty 200

## 2020-06-04 MED ORDER — OXYBUTYNIN CHLORIDE 5 MG PO TABS: 5.0000 mg | ORAL_TABLET | Freq: Three times a day (TID) | ORAL | Status: DC | PRN

## 2020-06-04 MED ORDER — METRONIDAZOLE IN NACL 5-0.79 MG/ML-% IV SOLN
500.0000 mg | Freq: Once | INTRAVENOUS | Status: AC
  Administered 2020-06-04: 500 mg via INTRAVENOUS
  Filled 2020-06-04: qty 100

## 2020-06-04 MED ORDER — ATORVASTATIN CALCIUM 20 MG PO TABS
40.0000 mg | ORAL_TABLET | Freq: Two times a day (BID) | ORAL | Status: DC
  Administered 2020-06-04 – 2020-06-09 (×10): 40 mg via ORAL
  Filled 2020-06-04 (×10): qty 2

## 2020-06-04 MED ORDER — SODIUM CHLORIDE 0.9 % IV SOLN: 2.0000 g | Freq: Once | INTRAVENOUS | Status: DC

## 2020-06-04 MED ORDER — ADULT MULTIVITAMIN W/MINERALS CH
1.0000 | ORAL_TABLET | Freq: Every day | ORAL | Status: DC
  Administered 2020-06-05 – 2020-06-09 (×4): 1 via ORAL
  Filled 2020-06-04 (×5): qty 1

## 2020-06-04 MED ORDER — FISH OIL 1200 MG PO CAPS
1200.0000 mg | ORAL_CAPSULE | Freq: Every day | ORAL | Status: DC
Start: 2020-06-04 — End: 2020-06-04

## 2020-06-04 MED ORDER — ACETAMINOPHEN 650 MG RE SUPP
650.0000 mg | Freq: Four times a day (QID) | RECTAL | Status: DC | PRN
  Administered 2020-06-04: 650 mg via RECTAL
  Filled 2020-06-04: qty 1

## 2020-06-04 NOTE — ED Triage Notes (Signed)
Pt BIBA. Pt was discharged from here last Friday. Pt had a urinary procedure done couple weeks ago and has been going downhill since then. Pt having urinary incontinence. EMS called out for AMS for "couple days". Pt disoriented x4.

## 2020-06-04 NOTE — Sepsis Progress Note (Signed)
elink is following this code sepsis 

## 2020-06-04 NOTE — Progress Notes (Signed)
Pharmacy Antibiotic Note  Dustin Crane is a 80 y.o. male admitted on 06/04/2020. Code sepsis called in the ED. Pharmacy has been consulted for cefepime dosing.  Plan: Cefepime 2 g IV q8h  Height: 5\' 8"  (172.7 cm) Weight: 81.2 kg (179 lb 0.2 oz) IBW/kg (Calculated) : 68.4  Temp (24hrs), Avg:101.7 F (38.7 C), Min:101.7 F (38.7 C), Max:101.7 F (38.7 C)  Recent Labs  Lab 05/29/20 0730 05/30/20 0511 06/04/20 1126  WBC 9.0 7.8 13.0*  CREATININE 0.86 0.82 0.95  LATICACIDVEN  --   --  2.3*    Estimated Creatinine Clearance: 63 mL/min (by C-G formula based on SCr of 0.95 mg/dL).    Allergies  Allergen Reactions  . Haemophilus Influenzae Anaphylaxis, Shortness Of Breath and Rash    Rash-severe n/v/d Ended up in hospital after taking!   . Hemophilus B Polysaccharide Vaccine Anaphylaxis, Shortness Of Breath and Rash    Rash-severe n/v/d   . Influenza Vac Split [Influenza Virus Vaccine] Shortness Of Breath and Other (See Comments)    Rash-severe n/v/d  . Hydroxyzine     Patient denies?????  . Compazine [Prochlorperazine Edisylate] Other (See Comments)    Muscle tightness    Antimicrobials this admission: Vanc 3/30 x 1 Cefepime 3/30 >>  Microbiology results: 3/30 BCx: pending 3/30 UCx: pending    Thank you for allowing pharmacy to be a part of this patient's care.  Tawnya Crook, PharmD 06/04/2020 2:35 PM

## 2020-06-04 NOTE — ED Provider Notes (Signed)
[facility] Emergency Department Provider Note  Time seen: 11:11 AM  I have reviewed the triage vital signs and the nursing notes.   HISTORY  Chief Complaint Altered Mental Status   HPI Dustin Crane is a 80 y.o. male with a past medical history of arthritis, diabetes, gastric reflux, hypertension, hyperlipidemia, prior MI, presents to the emergency department for altered mental status.   According to EMS report patient was discharged from here approximately 5 days ago, and had a urinary procedure done 1 to 2 weeks ago and then has been progressively declining since.  Upon arrival to the emergency department patient is awake but he is confused and disoriented.  Unable to answer questions or follow commands.  Lays in the bed will open his eyes and look at you and occasionally try to mumble some words but then falls back asleep.  Past Medical History:  Diagnosis Date  . Arthritis   . Cancer ([place])    skin cancer with tags removed  . Coronary artery disease   . Diabetes mellitus without complication ([place])   . GERD (gastroesophageal reflux disease)   . Hyperlipemia   . Hypertension   . Myocardial infarction [facility]) 2004   2 stents  . Peripheral vascular disease ([place])   . TIA (transient ischemic attack) 10/2018   PATIENT DENIES THIS DIAGNOSIS  . Wears glasses   . Wears hearing aid    both ears    Patient Active Problem List   Diagnosis Date Noted  . Sepsis secondary to UTI ([place]) 05/27/2020  . Delirium 05/27/2020  . Seizure ([place]) 05/27/2020  . Hypokalemia 05/27/2020  . Hypomagnesemia 05/27/2020  . Anemia, unspecified 11/27/2018  . CAD (coronary artery disease) 11/27/2018  . Complete rupture of rotator cuff 11/27/2018  . Disorder of bursae and tendons in shoulder region 11/27/2018  . Neck pain 11/27/2018  . Osteoarthritis 11/27/2018  . Sinoatrial node dysfunction ([place]) 11/27/2018  . Carotid stenosis 11/27/2018  . Other chest pain 10/26/2017  . Acute  pain of left knee 04/21/2017  . Gastroesophageal reflux disease without esophagitis 05/06/2016  . Diabetes mellitus type 2 with peripheral artery disease ([place]) 11/03/2015  . Mild mitral insufficiency 04/09/2015  . 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 Benign essential hypertension 07/26/2014  . Mixed hyperlipidemia 08/09/2013    Past Surgical History:  Procedure Laterality Date  . ACHILLES TENDON SURGERY Right 12/07/2018   Procedure: ACHILLES TENDON REPAIR SECONDARY;  Surgeon: Albertine Patricia, DPM;  Location: [place];  Service: Podiatry;  Laterality: Right;  LMA LOCAL DIABETIC   block  . CALCANEAL OSTEOTOMY Right 12/07/2018   Procedure: PARTIAL CALCANECTOMY RIGHT;  Surgeon: Albertine Patricia, DPM;  Location: [place];  Service: Podiatry;  Laterality: Right;  . CARDIAC CATHETERIZATION  2005   stents x2  . CATARACT EXTRACTION W/PHACO Right 04/22/2020   Procedure: CATARACT EXTRACTION PHACO AND INTRAOCULAR LENS PLACEMENT (I[facility]) RIGHT DIABETIC 3.65 00:41.9;  Surgeon: Birder Robson, MD;  Location: [place];  Service: Ophthalmology;  Laterality: Right;  . CATARACT EXTRACTION W/PHACO Left 05/06/2020   Procedure: CATARACT EXTRACTION PHACO AND INTRAOCULAR LENS PLACEMENT ([place]) LEFT DIABETIC MYALUGIN;  Surgeon: Birder Robson, MD;  Location: [place];  Service: Ophthalmology;  Laterality: Left;  Dexycu 9%  LOT [id number]  EXP 10/05/2020 given @ 0757 inj OS  8.30 1:12.4  . COLONOSCOPY    . COLONOSCOPY WITH PROPOFOL N/A 04/03/2020   Procedure: COLONOSCOPY WITH PROPOFOL;  Surgeon: [name], MD;  [facility] ENDOSCOPY;  Service: Gastroenterology;  Laterality: N/A;  . CYSTOSCOPY WITH INSERTION OF UROLIFT N/A 05/19/2020   Procedure: CYSTOSCOPY WITH INSERTION OF UROLIFT;  Surgeon: Hollice Espy, MD;  Location: [facility];  Service: Urology;  Laterality: N/A;  . FINGER ARTHROPLASTY Right 12/05/2012   Procedure: RIGHT INDEX FINGER IMPLANT ARTHROPLASTY;  Surgeon: Cammie Sickle., MD;  Location: [place];  Service: Orthopedics;  Laterality: Right;  . HERNIA REPAIR  1990   rt ing   . QUADRICEPS TENDON REPAIR Left 02/11/2017   Procedure: REPAIR QUADRICEP TENDON;  Surgeon: Leim Fabry, MD;  Location: [facility];  Service: Orthopedics;  Laterality: Left;  . SHOULDER ACROMIOPLASTY  1967   [place]   . SHOULDER ARTHROSCOPY  2012   right  . TONSILLECTOMY      Prior to Admission medications   Medication Sig Start Date End Date Taking? Authorizing Provider  aspirin 81 MG tablet Take 81 mg by mouth daily.    [provider]  atorvastatin (LIPITOR) 40 MG tablet Take 40 mg by mouth in the morning and at bedtime.    [provider]  cholecalciferol (VITAMIN D3) 25 MCG (1000 UNIT) tablet Take 1,000 Units by mouth daily.    [provider]  ciprofloxacin (CIPRO) 500 MG tablet Take 1 tablet (500 mg total) by mouth 2 (two) times daily for 8 days. 05/30/20 06/07/20  Wyvonnia Dusky, MD  ferrous sulfate 325 (65 FE) MG tablet Take 325 mg by mouth 3 (three) times a week.    [provider]  glimepiride (AMARYL) 4 MG tablet Take 4 mg by mouth 2 (two) times daily. 08/05/19   [provider]  metFORMIN (GLUCOPHAGE) 500 MG tablet Take 1,000 mg by mouth 2 (two) times daily with a meal.    [provider]  Multiple Vitamins-Minerals (MULTIVITAMIN WITH MINERALS) tablet Take 1 tablet by mouth daily.    [provider]  Omega-3 Fatty Acids (FISH OIL) 1200 MG CAPS Take 1,200 mg by mouth daily.    [provider]  omeprazole (PRILOSEC) 20 MG capsule Take 20 mg by mouth 2 (two) times daily.    [provider]  oxybutynin (DITROPAN) 5 MG tablet Take 1 tablet (5 mg total) by mouth every 8 (eight) hours as needed for bladder spasms. 05/26/20   Vaillancourt, Aldona Bar, PA-C  pioglitazone (ACTOS) 30 MG tablet Take 30 mg by mouth daily. 08/03/19   [provider]  ramipril  (ALTACE) 10 MG capsule Take 10 mg by mouth every evening.    [provider]  silodosin (RAPAFLO) 8 MG CAPS capsule Take 1 capsule (8 mg total) by mouth daily with breakfast for 7 days. 05/30/20 06/06/20  Wyvonnia Dusky, MD  sitaGLIPtin (JANUVIA) 100 MG tablet Take 100 mg by mouth daily.    [provider]    Allergies  Allergen Reactions  . Haemophilus Influenzae Anaphylaxis, Shortness Of Breath and Rash    Rash-severe n/v/d Ended up in hospital after taking!   . Hemophilus B Polysaccharide Vaccine Anaphylaxis, Shortness Of Breath and Rash    Rash-severe n/v/d   . Influenza Vac Split [Influenza Virus Vaccine] Shortness Of Breath and Other (See Comments)    Rash-severe n/v/d  . Hydroxyzine     Patient denies?????  . Compazine [Prochlorperazine Edisylate] Other (See Comments)    Muscle tightness    Family History  Problem Relation Age of Onset  . Coronary artery disease Mother   . Diabetes Mother   . Alzheimer's disease  Mother   . Heart disease Father   . Diabetes Father     Social History Social History   Tobacco Use  . Smoking status: Former Smoker    Quit date: 1978    Years since quitting: 44.2  . Smokeless tobacco: Never Used  Vaping Use  . Vaping Use: Never used  Substance Use Topics  . Alcohol use: Yes    Comment: occ  . Drug use: No    Review of Systems Unable to obtain adequate/accurate review of systems secondary to altered mental status/confusion ____________________________________________   PHYSICAL EXAM:  VITAL SIGNS: ED Triage Vitals  Enc Vitals Group     BP 06/04/20 1100 (!) 141/80     Pulse Rate 06/04/20 1100 (!) 121     Resp 06/04/20 1100 18     Temp --      Temp src --      SpO2 06/04/20 1100 94 %     Weight 06/04/20 1047 179 lb 0.2 oz (81.2 kg)     Height 06/04/20 1047 5\' 8"  (1.727 m)     Head Circumference --      Peak Flow --      Pain Score --      Pain Loc --      Pain Edu? --      Excl. in [place]? --     Constitutional: Alert and oriented. Well appearing and in no distress. Eyes: Normal exam ENT      Head: Normocephalic and atraumatic.      Mouth/Throat: Mucous membranes are moist. Cardiovascular: Normal rate, regular rhythm. Respiratory: Normal respiratory effort without tachypnea nor retractions. Breath sounds are clear  Gastrointestinal: Soft and nontender. No distention.   Musculoskeletal: Nontender with normal range of motion in all extremities.  Neurologic:  Normal speech and language. No gross focal neurologic deficits  Skin:  Skin is warm, dry and intact.  Psychiatric: Mood and affect are normal.   ____________________________________________   RADIOLOGY  Chest x-ray is negative  ____________________________________________   INITIAL IMPRESSION / ASSESSMENT AND PLAN / ED COURSE  Pertinent labs & imaging results that were available during my care of the patient were reviewed by me and considered in my medical decision making (see chart for details).   Patient presents to the emergency department for altered mental status.  Patient is confused, altered and disoriented, somnolent and tachycardic.  We will check labs, urine, chest x-ray, Covid swab and continue to closely monitor.  We will IV hydrate while awaiting results.  Patient found to be febrile in the emergency department.  Given the patient's 101.7 temperature with heart rate of 121 meeting sepsis criteria we will start the patient on broad-spectrum antibiotics, IV fluids.  Work-up pending.  Patient's work-up shows an elevated white blood cell count, elevated lactic acid.  Urinalysis is nitrite positive consistent with urinary tract infection.  Blood and urine cultures have been sent.  Patient receiving broad-spectrum antibiotics.  We will admit to the hospital service for further work-up and treatment.  CRAWFORD TAMURA was evaluated in Emergency Department on 06/04/2020 for the symptoms described in the history of  present illness. He was evaluated in the context of the global COVID-19 pandemic, which necessitated consideration that the patient might be at risk for infection with the SARS-CoV-2 virus that causes COVID-19. Institutional protocols and algorithms that pertain to the evaluation of patients at risk for COVID-19 are in a state of rapid change based on information released by regulatory bodies  including the CDC and federal and state organizations. These policies and algorithms were followed during the patient's care in the ED.  CRITICAL CARE Performed by: Harvest Dark   Total critical care time: 30 minutes  Critical care time was exclusive of separately billable procedures and treating other patients.  Critical care was necessary to treat or prevent imminent or life-threatening deterioration.  Critical care was time spent personally by me on the following activities: development of treatment plan with patient and/or surrogate as well as nursing, discussions with consultants, evaluation of patient's response to treatment, examination of patient, obtaining history from patient or surrogate, ordering and performing treatments and interventions, ordering and review of laboratory studies, ordering and review of radiographic studies, pulse oximetry and re-evaluation of patient's condition.  ____________________________________________   FINAL CLINICAL IMPRESSION(S) / ED DIAGNOSES  Altered mental status Sepsis Urinary tract infection   Harvest Dark, MD 06/04/20 1345

## 2020-06-04 NOTE — Telephone Encounter (Signed)
Patients wife called. Pt is back in ER. She brought him this morning for altered mental status. When they arrived he was febrile with a 101 temperature. Pt wife states they believe possible UTI. As per wife, He was sent home on Friday, was doing fine and has been taking Cipro.

## 2020-06-04 NOTE — Progress Notes (Signed)
PHARMACY -  BRIEF ANTIBIOTIC NOTE   Pharmacy has received consult(s) for vancomycin and cefepime from an ED provider.  The patient's profile has been reviewed for ht/wt/allergies/indication/available labs.    One time order(s) placed for vancomycin 1000 mg + cefepime 2 g  Further antibiotics/pharmacy consults should be ordered by admitting physician if indicated.                       Thank you,  Tawnya Crook, PharmD 06/04/2020  11:28 AM

## 2020-06-04 NOTE — H&P (Signed)
History and Physical    Dustin Crane ZOX:[id number] DOB: October 27, 1940 DOA: 06/04/2020  PCP: Idelle Crouch, MD   Patient coming from: Home  I have personally briefly reviewed patient's old medical records in [place]  Chief Complaint: Change in mental status Most of the history was obtained from patient's wife over the phone.  Patient is lethargic and unable to provide any history  HPI: Dustin Crane is a 80 y.o. male with medical history significant  for hypertension, type II diabetes mellitus, coronary artery disease status post stent angioplasty, GERD, peripheral vascular disease and status post recent UroLift procedure which was done on 05/19/20. Patient was recently discharged from the hospital after treatment for sepsis from a UTI.  He also had delirium.  His urine culture during the hospitalization yielded Citrobacter freundii and patient was treated with IV cefepime and transitioned to oral ciprofloxacin which he is yet to complete.  Per his wife his mental status was back to baseline upon his discharge home 3 days ago. Patient was in his usual state of health until this morning when his wife found him confused and lethargic in the living room.  He had gone to bed in his usual state of health and per his wife had helped her with some activities around the house the day prior. She called EMS and patient was brought to the ER for further evaluation. Patient is lethargic and opens eyes to painful stimuli.  I am unable to get any history from him and due to his mental status unable to do a review of systems. Labs show sodium 135, potassium 4.4, chloride 97, bicarb 26, glucose 307, BUN 14, creatinine 0.95, calcium 10.0, alkaline phosphatase 84, albumin 4.2, AST 26, ALT 30, total protein 7.8, lactic acid 2.3, white count 13.0, hemoglobin 14.0, hematocrit 39.9, MCV 86.6, RDW 11.9, platelet 302, PT 13.4, INR 1.1 Respiratory viral panel is negative Urine analysis is positive for  nitrates Chest x-ray reviewed by me shows clear lungs   ED Course: Patient is a 80 year old Caucasian male who was recently discharged from the hospital following treatment for sepsis from a urinary source.  He also had delirium during that hospitalization.  His urine culture yielded Citrobacter freundii, patient was treated with IV cefepime and discharged home on ciprofloxacin which he is yet to complete.  Per his wife upon discharge he was back to his baseline mental status and had an acute change in status on the day of admission. He was febrile with a T-max of 101.27F, tachycardic with heart rate of 121 and has leukocytosis.  Lactic acid was elevated at 2.3 and patient has pyuria.  Sepsis fluids was initiated in the ER and he received broad-spectrum antibiotic therapy.  He will be admitted to the hospital for further evaluation.  Review of Systems: As per HPI otherwise all other systems reviewed and negative.    Past Medical History:  Diagnosis Date  . Arthritis   . Cancer ([place])    skin cancer with tags removed  . Coronary artery disease   . Diabetes mellitus without complication ([place])   . GERD (gastroesophageal reflux disease)   . Hyperlipemia   . Hypertension   . Myocardial infarction [facility]) 2004   2 stents  . Peripheral vascular disease ([place])   . TIA (transient ischemic attack) 10/2018   PATIENT DENIES THIS DIAGNOSIS  . Wears glasses   . Wears hearing aid    both ears    Past Surgical History:  Procedure  Laterality Date  . ACHILLES TENDON SURGERY Right 12/07/2018   Procedure: ACHILLES TENDON REPAIR SECONDARY;  Surgeon: Albertine Patricia, DPM;  Location: [place];  Service: Podiatry;  Laterality: Right;  LMA LOCAL DIABETIC   block  . CALCANEAL OSTEOTOMY Right 12/07/2018   Procedure: PARTIAL CALCANECTOMY RIGHT;  Surgeon: Albertine Patricia, DPM;  Location: [place];  Service: Podiatry;  Laterality: Right;  . CARDIAC CATHETERIZATION  2005   stents x2  .  CATARACT EXTRACTION W/PHACO Right 04/22/2020   Procedure: CATARACT EXTRACTION PHACO AND INTRAOCULAR LENS PLACEMENT (I[facility]) RIGHT DIABETIC 3.65 00:41.9;  Surgeon: Birder Robson, MD;  Location: [place];  Service: Ophthalmology;  Laterality: Right;  . CATARACT EXTRACTION W/PHACO Left 05/06/2020   Procedure: CATARACT EXTRACTION PHACO AND INTRAOCULAR LENS PLACEMENT ([place]) LEFT DIABETIC MYALUGIN;  Surgeon: Birder Robson, MD;  Location: [place];  Service: Ophthalmology;  Laterality: Left;  Dexycu 9%  LOT [id number]  EXP 10/05/2020 given @ 0757 inj OS  8.30 1:12.4  . COLONOSCOPY    . COLONOSCOPY WITH PROPOFOL N/A 04/03/2020   Procedure: COLONOSCOPY WITH PROPOFOL;  Surgeon: [name], MD;  Location[facility] ENDOSCOPY;  Service: Gastroenterology;  Laterality: N/A;  . CYSTOSCOPY WITH INSERTION OF UROLIFT N/A 05/19/2020   Procedure: CYSTOSCOPY WITH INSERTION OF UROLIFT;  Surgeon: Hollice Espy, MD;  Location: [facility]S;  Service: Urology;  Laterality: N/A;  . FINGER ARTHROPLASTY Right 12/05/2012   Procedure: RIGHT INDEX FINGER IMPLANT ARTHROPLASTY;  Surgeon: Cammie Sickle., MD;  Location: [place];  Service: Orthopedics;  Laterality: Right;  . HERNIA REPAIR  1990   rt ing   . QUADRICEPS TENDON REPAIR Left 02/11/2017   Procedure: REPAIR QUADRICEP TENDON;  Surgeon: Leim Fabry, MD;  Location: [facility];  Service: Orthopedics;  Laterality: Left;  . SHOULDER ACROMIOPLASTY  1967   [place]   . SHOULDER ARTHROSCOPY  2012   right  . TONSILLECTOMY       reports that he quit smoking about 44 years ago. He has never used smokeless tobacco. He reports current alcohol use. He reports that he does not use drugs.  Allergies  Allergen Reactions  . Haemophilus Influenzae Anaphylaxis, Shortness Of Breath and Rash    Rash-severe n/v/d Ended up in hospital after taking!   . Hemophilus B Polysaccharide Vaccine Anaphylaxis, Shortness Of Breath and Rash     Rash-severe n/v/d   . Influenza Vac Split [Influenza Virus Vaccine] Shortness Of Breath and Other (See Comments)    Rash-severe n/v/d  . Hydroxyzine     Patient denies?????  . Compazine [Prochlorperazine Edisylate] Other (See Comments)    Muscle tightness    Family History  Problem Relation Age of Onset  . Coronary artery disease Mother   . Diabetes Mother   . Alzheimer's disease Mother   . Heart disease Father   . Diabetes Father       Prior to Admission medications   Medication Sig Start Date End Date Taking? Authorizing Provider  aspirin 81 MG tablet Take 81 mg by mouth daily.    [provider]  atorvastatin (LIPITOR) 40 MG tablet Take 40 mg by mouth in the morning and at bedtime.    [provider]  cholecalciferol (VITAMIN D3) 25 MCG (1000 UNIT) tablet Take 1,000 Units by mouth daily.    [provider]  ciprofloxacin (CIPRO) 500 MG tablet Take 1 tablet (500 mg total) by mouth 2 (two) times daily for 8 days. 05/30/20 06/07/20  Jimmye Norman,  August Saucer, MD  ferrous sulfate 325 (65 FE) MG tablet Take 325 mg by mouth 3 (three) times a week.    [provider]  glimepiride (AMARYL) 4 MG tablet Take 4 mg by mouth 2 (two) times daily. 08/05/19   [provider]  metFORMIN (GLUCOPHAGE) 500 MG tablet Take 1,000 mg by mouth 2 (two) times daily with a meal.    [provider]  Multiple Vitamins-Minerals (MULTIVITAMIN WITH MINERALS) tablet Take 1 tablet by mouth daily.    [provider]  Omega-3 Fatty Acids (FISH OIL) 1200 MG CAPS Take 1,200 mg by mouth daily.    [provider]  omeprazole (PRILOSEC) 20 MG capsule Take 20 mg by mouth 2 (two) times daily.    [provider]  oxybutynin (DITROPAN) 5 MG tablet Take 1 tablet (5 mg total) by mouth every 8 (eight) hours as needed for bladder spasms. 05/26/20   Vaillancourt, Aldona Bar, PA-C  pioglitazone (ACTOS) 30 MG tablet Take 30 mg by mouth daily. 08/03/19   [provider]  ramipril (ALTACE) 10 MG capsule Take 10 mg by mouth every evening.    [provider]  silodosin (RAPAFLO) 8 MG CAPS capsule Take 1 capsule (8 mg total) by mouth daily with breakfast for 7 days. 05/30/20 06/06/20  Wyvonnia Dusky, MD  sitaGLIPtin (JANUVIA) 100 MG tablet Take 100 mg by mouth daily.    [provider]    Physical Exam: Vitals:   06/04/20 1200 06/04/20 1230 06/04/20 1300 06/04/20 1330  BP: 127/88 (!) 154/115 (!) 156/136 (!) 146/125  Pulse: (!) 122 (!) 102 (!) 128 (!) 129  Resp: (!) 22 (!) 28 (!) 25 (!) 23  Temp:      TempSrc:      SpO2: 94% 96% 97% 97%  Weight:      Height:         Vitals:   06/04/20 1200 06/04/20 1230 06/04/20 1300 06/04/20 1330  BP: 127/88 (!) 154/115 (!) 156/136 (!) 146/125  Pulse: (!) 122 (!) 102 (!) 128 (!) 129  Resp: (!) 22 (!) 28 (!) 25 (!) 23  Temp:      TempSrc:      SpO2: 94% 96% 97% 97%  Weight:      Height:          Constitutional:  Lethargic.  Grimaces to painful stimuli. Not in any apparent distress HEENT:      Head: Normocephalic and atraumatic.         Eyes: PERLA, EOMI, Conjunctivae are normal. Sclera is non-icteric.       Mouth/Throat: Mucous membranes are dry.       Neck: Supple with no signs of meningismus. Cardiovascular:  Tachycardic. No murmurs, gallops, or rubs. 2+ symmetrical distal pulses are present . No JVD. No  LE edema Respiratory: Respiratory effort normal .Lungs sounds clear bilaterally. No wheezes, crackles, or rhonchi.  Gastrointestinal: Soft, supra pubic tenderness, and non distended with positive bowel sounds.  Genitourinary: No CVA tenderness.  Foley catheter in place Musculoskeletal: Nontender with normal range of motion in all extremities. No cyanosis, or erythema of extremities. Neurologic:  Face is symmetric. Moving all extremities. No gross focal neurologic deficits . Skin: Skin is warm, dry.  No rash or ulcers Psychiatric: Mood and affect are normal    Labs on  Admission: I have personally reviewed following labs and imaging studies  CBC: Recent Labs  Lab 05/29/20 0730 05/30/20 0511 06/04/20 1126  WBC 9.0 7.8 13.0*  NEUTROABS  --   --  11.2*  HGB 12.1* 10.7* 14.0  HCT 34.7* 30.6* 39.9  MCV 87.6 87.7 86.6  PLT 198 203 245   Basic Metabolic Panel: Recent Labs  Lab 05/29/20 0730 05/30/20 0511 06/04/20 1126  NA 140 141 135  K 4.0 3.4* 4.4  CL 108 109 97*  CO2 26 25 26   GLUCOSE 171* 147* 307*  BUN 10 11 14   CREATININE 0.86 0.82 0.95  CALCIUM 8.4* 8.5* 10.0   GFR: Estimated Creatinine Clearance: 63 mL/min (by C-G formula based on SCr of 0.95 mg/dL). Liver Function Tests: Recent Labs  Lab 06/04/20 1126  AST 26  ALT 30  ALKPHOS 84  BILITOT 1.0  PROT 7.8  ALBUMIN 4.2   No results for input(s): LIPASE, AMYLASE in the last 168 hours. No results for input(s): AMMONIA in the last 168 hours. Coagulation Profile: Recent Labs  Lab 06/04/20 1126  INR 1.1   Cardiac Enzymes: No results for input(s): CKTOTAL, CKMB, CKMBINDEX, TROPONINI in the last 168 hours. BNP (last 3 results) No results for input(s): PROBNP in the last 8760 hours. HbA1C: No results for input(s): HGBA1C in the last 72 hours. CBG: Recent Labs  Lab 05/29/20 1201 05/29/20 1753 05/29/20 2134 05/30/20 0724 05/30/20 1125  GLUCAP 171* 289* 232* 151* 251*   Lipid Profile: No results for input(s): CHOL, HDL, LDLCALC, TRIG, CHOLHDL, LDLDIRECT in the last 72 hours. Thyroid Function Tests: No results for input(s): TSH, T4TOTAL, FREET4, T3FREE, THYROIDAB in the last 72 hours. Anemia Panel: No results for input(s): VITAMINB12, FOLATE, FERRITIN, TIBC, IRON, RETICCTPCT in the last 72 hours. Urine analysis:    Component Value Date/Time   COLORURINE AMBER (A) 06/04/2020 1126   APPEARANCEUR CLEAR (A) 06/04/2020 1126   APPEARANCEUR Hazy (A) 05/09/2020 1056   LABSPEC 1.017 06/04/2020 1126   PHURINE 6.0 06/04/2020 1126   GLUCOSEU 150 (A) 06/04/2020 1126   HGBUR  NEGATIVE 06/04/2020 [place] 06/04/2020 1126   BILIRUBINUR Negative 05/09/2020 1056   KETONESUR 5 (A) 06/04/2020 1126   PROTEINUR 100 (A) 06/04/2020 1126   NITRITE POSITIVE (A) 06/04/2020 1126   LEUKOCYTESUR NEGATIVE 06/04/2020 1126    Radiological Exams on Admission: DG Chest Port 1 View  Result Date: 06/04/2020 CLINICAL DATA:  Altered mental status.  Concern for sepsis EXAM: PORTABLE CHEST 1 VIEW COMPARISON:  May 27, 2020 FINDINGS: Lungs are clear. Heart upper normal in size with pulmonary vascularity normal. There is aortic atherosclerosis. Postoperative change noted in right shoulder. No evident adenopathy. IMPRESSION: Lungs clear. Heart upper normal in size. Aortic Atherosclerosis (ICD10-I70.0). Electronically Signed   By: Lowella Grip III M.D.   On: 06/04/2020 11:45     Assessment/Plan Principal Problem:   Sepsis secondary to UTI [facility]) Active Problems:   Benign essential hypertension   CAD (coronary artery disease)   Diabetes mellitus with hyperglycemia (HCC)   Gastroesophageal reflux disease without esophagitis   Acute metabolic encephalopathy      Sepsis secondary to UTI As evidenced by fever with a T-max of 101.7 tachycardia, leukocytosis, pyuria and elevated lactic acid level. Prior urine culture yielded Citrobacter freundii sensitive to cephalosporins We will place patient on cefepime adjusted to renal function since he recently had a UroLift procedure done about 2 weeks ago Continue aggressive IV fluid resuscitation Follow-up results of urine and blood culture     Acute metabolic encephalopathy Secondary to UTI Patient is lethargic and grimaces to painful stimuli but at baseline is usually awake, alert  and oriented to person place and time. Expect improvement in patient's mental status following resolution of acute infection    Diabetes mellitus with hyperglycemia We will keep patient n.p.o. for now until mental status  improves Continue IV fluid hydration Blood sugar checks every 4 hours Hold oral hypoglycemic agents     Coronary artery disease Continue aspirin, statins and fish oil   History of hypertension Continue ramipril for now    BPH Continue Flomax   DVT prophylaxis: Lovenox Code Status: full code Family Communication: Greater than 50% of time was spent discussing patient's condition and plan of care with his wife Dustin Crane over the phone.  All questions and concerns have been addressed.  She verbalizes understanding and agrees with the plan. Disposition Plan: Back to previous home environment Consults called: none Status: At the time of admission, it appears that the appropriate admission status for this patient is inpatient.  This is judged to be reasonable and necessary in order to provide the required intensity of service to ensure the patient's safety given the presenting symptoms, physical exam findings and initial radiographic and laboratory data in the context of their comorbid conditions. Patient requires inpatient status due to high intensity of service, high risk for further deterioration and high frequency of surveillance required.    Collier Bullock MD [facility]     06/04/2020, 2:46 PM

## 2020-06-04 NOTE — ED Notes (Signed)
pts wife at bedside. Call light in reach. Fall precautions in place.

## 2020-06-04 NOTE — ED Notes (Signed)
With assistance of patient wife, pt transferred from ED stretcher to hospital bed.

## 2020-06-04 NOTE — Progress Notes (Signed)
CODE SEPSIS - PHARMACY COMMUNICATION  **Broad Spectrum Antibiotics should be administered within 1 hour of Sepsis diagnosis**  Time Code Sepsis Called/Page Received: 1116  Antibiotics Ordered: cefepime/metronidazole/vancomycin  Time of 1st antibiotic administration: [place] ,PharmD Clinical Pharmacist  06/04/2020  12:38 PM

## 2020-06-04 NOTE — ED Notes (Signed)
Patient became more alert at this time. Patient able to recognize wife and hold conversation. Patient drinking ginger ale with assistance.

## 2020-06-05 ENCOUNTER — Inpatient Hospital Stay: Payer: Medicare Other

## 2020-06-05 DIAGNOSIS — A419 Sepsis, unspecified organism: Secondary | ICD-10-CM | POA: Diagnosis present

## 2020-06-05 DIAGNOSIS — N390 Urinary tract infection, site not specified: Secondary | ICD-10-CM | POA: Diagnosis not present

## 2020-06-05 LAB — BASIC METABOLIC PANEL
Anion gap: 9 (ref 5–15)
BUN: 14 mg/dL (ref 8–23)
CO2: 25 mmol/L (ref 22–32)
Calcium: 8.6 mg/dL — ABNORMAL LOW (ref 8.9–10.3)
Chloride: 101 mmol/L (ref 98–111)
Creatinine, Ser: 0.89 mg/dL (ref 0.61–1.24)
GFR, Estimated: >60 mL/min (ref >60)
Glucose, Bld: 227 mg/dL — ABNORMAL HIGH (ref 70–99)
Potassium: 3.3 mmol/L — ABNORMAL LOW (ref 3.5–5.1)
Sodium: 135 mmol/L (ref 135–145)

## 2020-06-05 LAB — PROTIME-INR
INR: 1.2 (ref 0.8–1.2)
Prothrombin Time: 14.6 seconds (ref 11.4–15.2)

## 2020-06-05 LAB — CBC
HCT: 32.2 % — ABNORMAL LOW (ref 39.0–52.0)
Hemoglobin: 11.5 g/dL — ABNORMAL LOW (ref 13.0–17.0)
MCH: 30.8 pg (ref 26.0–34.0)
MCHC: 35.7 g/dL (ref 30.0–36.0)
MCV: 86.3 fL (ref 80.0–100.0)
Platelets: 242 10*3/uL (ref 150–400)
RBC: 3.73 MIL/uL — ABNORMAL LOW (ref 4.22–5.81)
RDW: 11.9 % (ref 11.5–15.5)
WBC: 13.2 10*3/uL — ABNORMAL HIGH (ref 4.0–10.5)
nRBC: 0 % (ref 0.0–0.2)

## 2020-06-05 LAB — URINE CULTURE: Culture: NO GROWTH

## 2020-06-05 LAB — CBG MONITORING, ED
Glucose-Capillary: 216 mg/dL — ABNORMAL HIGH (ref 70–99)
Glucose-Capillary: 152 mg/dL — ABNORMAL HIGH (ref 70–99)
Glucose-Capillary: 117 mg/dL — ABNORMAL HIGH (ref 70–99)
Glucose-Capillary: 173 mg/dL — ABNORMAL HIGH (ref 70–99)

## 2020-06-05 LAB — CORTISOL-AM, BLOOD: Cortisol - AM: 11 ug/dL (ref 6.7–22.6)

## 2020-06-05 LAB — GLUCOSE, CAPILLARY
Glucose-Capillary: 275 mg/dL — ABNORMAL HIGH (ref 70–99)
Glucose-Capillary: 182 mg/dL — ABNORMAL HIGH (ref 70–99)

## 2020-06-05 LAB — CK: Total CK: 184 U/L (ref 49–397)

## 2020-06-05 LAB — PROCALCITONIN: Procalcitonin: <0.1 ng/mL

## 2020-06-05 MED ORDER — GLUCERNA SHAKE PO LIQD
237.0000 mL | Freq: Three times a day (TID) | ORAL | Status: DC
  Administered 2020-06-05 – 2020-06-09 (×6): 237 mL via ORAL

## 2020-06-05 MED ORDER — CHLORHEXIDINE GLUCONATE CLOTH 2 % EX PADS: 6.0000 | MEDICATED_PAD | Freq: Every day | CUTANEOUS | Status: DC

## 2020-06-05 MED ORDER — INSULIN ASPART 100 UNIT/ML ~~LOC~~ SOLN
0.0000 [IU] | Freq: Three times a day (TID) | SUBCUTANEOUS | Status: DC
  Administered 2020-06-05 – 2020-06-06 (×2): 8 [IU] via SUBCUTANEOUS
  Administered 2020-06-06 – 2020-06-07 (×3): 3 [IU] via SUBCUTANEOUS
  Administered 2020-06-07: 12:00:00 8 [IU] via SUBCUTANEOUS
  Administered 2020-06-07: 08:00:00 3 [IU] via SUBCUTANEOUS
  Administered 2020-06-08: 12:00:00 8 [IU] via SUBCUTANEOUS
  Administered 2020-06-08: 17:00:00 2 [IU] via SUBCUTANEOUS
  Administered 2020-06-08: 10:00:00 8 [IU] via SUBCUTANEOUS
  Administered 2020-06-09: 3 [IU] via SUBCUTANEOUS
  Filled 2020-06-05 (×11): qty 1

## 2020-06-05 MED ORDER — ASPIRIN EC 81 MG PO TBEC
81.0000 mg | DELAYED_RELEASE_TABLET | Freq: Every day | ORAL | Status: DC
  Administered 2020-06-05 – 2020-06-09 (×5): 81 mg via ORAL
  Filled 2020-06-05 (×5): qty 1

## 2020-06-05 MED ORDER — IOHEXOL 300 MG/ML  SOLN
100.0000 mL | Freq: Once | INTRAMUSCULAR | Status: AC | PRN
  Administered 2020-06-05: 23:00:00 100 mL via INTRAVENOUS

## 2020-06-05 MED ORDER — POTASSIUM CHLORIDE 20 MEQ PO PACK
40.0000 meq | PACK | Freq: Once | ORAL | Status: AC
  Administered 2020-06-05: 14:00:00 40 meq via ORAL
  Filled 2020-06-05: qty 2

## 2020-06-05 MED ORDER — IOHEXOL 9 MG/ML PO SOLN
500.0000 mL | ORAL | Status: AC
  Administered 2020-06-05 (×2): 500 mL via ORAL

## 2020-06-05 NOTE — ED Notes (Signed)
Patient assisted back into bed. Large bowel movement noted.

## 2020-06-05 NOTE — ED Notes (Signed)
Patient awake and alert at normal neurological baseline.

## 2020-06-05 NOTE — ED Notes (Signed)
Patient provided with meal tray. HOB adjusted to high fowlers. Patient given ginger ale. Denies other needs.

## 2020-06-05 NOTE — Progress Notes (Signed)
Initial Nutrition Assessment  DOCUMENTATION CODES:   Not applicable  INTERVENTION:   -MVI with minerals daily -Glucerna Shake po TID, each supplement provides 220 kcal and 10 grams of protein  NUTRITION DIAGNOSIS:   Increased nutrient needs related to acute illness (sepsis) as evidenced by estimated needs.  GOAL:   Patient will meet greater than or equal to 90% of their needs  MONITOR:   PO intake,Supplement acceptance,Labs,Weight trends,Skin,I & O's  REASON FOR ASSESSMENT:   Malnutrition Screening Tool    ASSESSMENT:   Dustin Crane is a 80 y.o. male with medical history significant  for hypertension, type II diabetes mellitus, coronary artery disease status post stent angioplasty, GERD, peripheral vascular disease and status post recent UroLift procedure which was done on 05/19/20.  Pt admitted with sepsis secondary to UTI.   Pt unavailable at time of visit. Attempted to speak with pt vai phone call to hospital room, however, unable to reach.   Pt is currently on a regular diet, however, no meal completions documented.   Reviewed wt hx; wt has been stable over the past 6 months.   Medications reviewed and include vitamin D3 and ferrous sulfate.   Labs reviewed: K: 3.3, CBGS: 117-216 (inpatient orders for glycemic control are 0-15 units insulin aspart every 4 hours).   Diet Order:   Diet Order            Diet regular Room service appropriate? Yes; Fluid consistency: Thin  Diet effective now                 EDUCATION NEEDS:   No education needs have been identified at this time  Skin:  Skin Assessment: Reviewed RN Assessment  Last BM:  06/05/20  Height:   Ht Readings from Last 1 Encounters:  06/04/20 5\' 8"  (1.727 m)    Weight:   Wt Readings from Last 1 Encounters:  06/04/20 81.2 kg    Ideal Body Weight:  70 kg  BMI:  Body mass index is 27.22 kg/m.  Estimated Nutritional Needs:   Kcal:  1900-2100  Protein:  90-105 grams  Fluid:  >  1.9 L    Loistine Chance, RD, LDN, [place] Registered Dietitian II Certified Diabetes Care and Education Specialist Please refer to [facility] for RD and/or RD on-call/weekend/after hours pager

## 2020-06-05 NOTE — ED Notes (Signed)
Report to [place], RN

## 2020-06-05 NOTE — Progress Notes (Addendum)
PROGRESS NOTE    Dustin Crane  CVE:[id number] DOB: [birth year] DOA: 06/04/2020 PCP: Idelle Crouch, MD  105A/[id number]-BB   Assessment & Plan:   Principal Problem:   Sepsis secondary to UTI [facility]) Active Problems:   Benign essential hypertension   CAD (coronary artery disease)   Diabetes mellitus with hyperglycemia ([place])   Gastroesophageal reflux disease without esophagitis   Acute metabolic encephalopathy   Sepsis ([place])   Dustin Crane is a 80 y.o. male with medical history significant  forhypertension, type II diabetes mellitus, coronary artery disease status post stent angioplasty, GERD, peripheral vascular disease and status post recent UroLift procedure which was done on 05/19/20. Patient was recently discharged from the hospital after treatment for sepsis from a UTI.  He also had delirium.  His urine culture during the last hospitalization yielded Citrobacter freundii and patient was treated with IV cefepime and transitioned to oral ciprofloxacin which he is yet to complete.  Per his wife his mental status was back to baseline upon his discharge home 3 days ago. Patient was in his usual state of health until morning of presentation when his wife found him confused and lethargic in the living room.  He had gone to bed in his usual state of health and per his wife had helped her with some activities around the house the day prior. She called EMS and patient was brought to the ER for further evaluation.  On presentaiton Patient was lethargic and opened eyes to painful stimuli.  Sepsis secondary to complicated UTI As evidenced by fever with a T-max of 101.7 tachycardia, leukocytosis, pyuria and elevated lactic acid level. Prior urine culture yielded Citrobacter freundii sensitive to cephalosporins. Appeared to have failed outpatient oral abx. --started on IV vacn, cefepime and flagyl on presentation, and IVF resuscitation. --urine cx from this admission is neg  growth Plan: --cont cefepime for now --order CT a/p w contrast, to assess prostate, given that pt has a UroLift implant.  Acute metabolic encephalopathy, resolved --likely due to infection.  Already improved the morning after presentation.  Diabetes mellitus, poorly controlled --recent A1c 8.1 --change to ACHS and SSI TID now that pt is having oral intake  Coronary artery disease --cont home ASA, statin and fish oil  History of hypertension Continue ramipril for now  BPH Continue Flomax  Hypokalemia --monitor and replete with oral potassium   DVT prophylaxis: Lovenox SQ Code Status: Full code  Family Communication: wife updated at bedside today Level of care: Med-Surg Dispo:   The patient is from: home Anticipated d/c is to: home Anticipated d/c date is: > 3 days Patient currently is not medically ready to d/c due to: failed outpatient oral abx for UTI, now need IV abx   Subjective and Interval History:  Pt was alert and almost back to his normal self this morning.  He said he didn't remember anything with coming to the hospital yesterday.  Complained of Foley cath bothering him and asking to have it removed.     Objective: Vitals:   06/05/20 1054 06/05/20 1314 06/05/20 1346 06/05/20 1634  BP: 129/80 118/75 131/75 (!) 123/98  Pulse: 68 73 92 88  Resp: (!) 23 15 18 18   Temp:  98.1 F (36.7 C) 98.2 F (36.8 C) 98.9 F (37.2 C)  TempSrc:  Oral Oral Oral  SpO2: 95% 96% 94% 97%  Weight:      Height:        Intake/Output Summary (Last 24 hours) at 06/05/2020 1705 Last  data filed at 06/05/2020 1040 Gross per 24 hour  Intake 1199 ml  Output 2025 ml  Net -826 ml   Filed Weights   06/04/20 1047  Weight: 81.2 kg    Examination:   Constitutional: NAD, AAOx3, smiling, responsive HEENT: conjunctivae and lids normal, EOMI CV: No cyanosis.   RESP: normal respiratory effort, on RA Extremities: No effusions, edema in BLE SKIN: warm, dry Neuro: II - XII  grossly intact.   Psych: Normal mood and affect.  Appropriate judgement and reason Foley present   Data Reviewed: I have personally reviewed following labs and imaging studies  CBC: Recent Labs  Lab 05/30/20 0511 06/04/20 1126 06/05/20 0411  WBC 7.8 13.0* 13.2*  NEUTROABS  --  11.2*  --   HGB 10.7* 14.0 11.5*  HCT 30.6* 39.9 32.2*  MCV 87.7 86.6 86.3  PLT 203 302 299   Basic Metabolic Panel: Recent Labs  Lab 05/30/20 0511 06/04/20 1126 06/05/20 0411  NA 141 135 135  K 3.4* 4.4 3.3*  CL 109 97* 101  CO2 25 26 25   GLUCOSE 147* 307* 227*  BUN 11 14 14   CREATININE 0.82 0.95 0.89  CALCIUM 8.5* 10.0 8.6*   GFR: Estimated Creatinine Clearance: 67.2 mL/min (by C-G formula based on SCr of 0.89 mg/dL). Liver Function Tests: Recent Labs  Lab 06/04/20 1126  AST 26  ALT 30  ALKPHOS 84  BILITOT 1.0  PROT 7.8  ALBUMIN 4.2   No results for input(s): LIPASE, AMYLASE in the last 168 hours. No results for input(s): AMMONIA in the last 168 hours. Coagulation Profile: Recent Labs  Lab 06/04/20 1126 06/05/20 0411  INR 1.1 1.2   Cardiac Enzymes: No results for input(s): CKTOTAL, CKMB, CKMBINDEX, TROPONINI in the last 168 hours. BNP (last 3 results) No results for input(s): PROBNP in the last 8760 hours. HbA1C: No results for input(s): HGBA1C in the last 72 hours. CBG: Recent Labs  Lab 06/05/20 0417 06/05/20 0548 06/05/20 0732 06/05/20 1158 06/05/20 1632  GLUCAP 216* 152* 117* 173* 275*   Lipid Profile: No results for input(s): CHOL, HDL, LDLCALC, TRIG, CHOLHDL, LDLDIRECT in the last 72 hours. Thyroid Function Tests: No results for input(s): TSH, T4TOTAL, FREET4, T3FREE, THYROIDAB in the last 72 hours. Anemia Panel: No results for input(s): VITAMINB12, FOLATE, FERRITIN, TIBC, IRON, RETICCTPCT in the last 72 hours. Sepsis Labs: Recent Labs  Lab 06/04/20 1126 06/04/20 1438 06/04/20 1749 06/05/20 0411  PROCALCITON  --   --   --  <0.10  LATICACIDVEN 2.3*  2.1* 2.0*  --     Recent Results (from the past 240 hour(s))  Urine culture     Status: Abnormal   Collection Time: 05/27/20  1:18 PM   Specimen: Urine, Random  Result Value Ref Range Status   Specimen Description   Final    URINE, RANDOM Performed at [facility], [address] [place],  [address]    Special Requests   Final    NONE Performed at [facility], [address] [place],  [address]    Culture >=100,000 COLONIES/mL CITROBACTER FREUNDII (A)  Final   Report Status 05/30/2020 FINAL  Final   Organism ID, Bacteria CITROBACTER FREUNDII (A)  Final      Susceptibility   Citrobacter freundii - MIC*    CEFAZOLIN >=64 RESISTANT Resistant     CEFEPIME <=0.12 SENSITIVE Sensitive     CEFTRIAXONE <=0.25 SENSITIVE Sensitive     CIPROFLOXACIN <=0.25 SENSITIVE Sensitive  GENTAMICIN <=1 SENSITIVE Sensitive     IMIPENEM <=0.25 SENSITIVE Sensitive     NITROFURANTOIN <=16 SENSITIVE Sensitive     TRIMETH/SULFA <=20 SENSITIVE Sensitive     PIP/TAZO <=4 SENSITIVE Sensitive     * >=100,000 COLONIES/mL CITROBACTER FREUNDII  Blood culture (routine x 2)     Status: None   Collection Time: 05/27/20  2:41 PM   Specimen: BLOOD  Result Value Ref Range Status   Specimen Description BLOOD LEFT ANTECUBITAL  Final   Special Requests   Final    BOTTLES DRAWN AEROBIC AND ANAEROBIC Blood Culture results may not be optimal due to an excessive volume of blood received in culture bottles   Culture   Final    NO GROWTH 5 DAYS Performed at [facility], [address], [place], [place] [address]    Report Status 06/01/2020 FINAL  Final  Blood culture (routine x 2)     Status: None   Collection Time: 05/27/20  3:12 PM   Specimen: BLOOD  Result Value Ref Range Status   Specimen Description BLOOD LEFT ANTECUBITAL  Final   Special Requests   Final    BOTTLES DRAWN AEROBIC AND ANAEROBIC Blood Culture adequate volume   Culture   Final    NO GROWTH 5  DAYS Performed at [facility], [address] [place], [place] [address]    Report Status 06/01/2020 FINAL  Final  Resp Panel by RT-PCR (Flu A&B, Covid) Nasopharyngeal Swab     Status: None   Collection Time: 05/27/20  4:42 PM   Specimen: Nasopharyngeal Swab; Nasopharyngeal(NP) swabs in vial transport medium  Result Value Ref Range Status   SARS Coronavirus 2 by RT PCR NEGATIVE NEGATIVE Final    Comment: (NOTE) SARS-CoV-2 target nucleic acids are NOT DETECTED.  The SARS-CoV-2 RNA is generally detectable in upper respiratory specimens during the acute phase of infection. The lowest concentration of SARS-CoV-2 viral copies this assay can detect is 138 copies/mL. A negative result does not preclude SARS-Cov-2 infection and should not be used as the sole basis for treatment or other patient management decisions. A negative result may occur with  improper specimen collection/handling, submission of specimen other than nasopharyngeal swab, presence of viral mutation(s) within the areas targeted by this assay, and inadequate number of viral copies(<138 copies/mL). A negative result must be combined with clinical observations, patient history, and epidemiological information. The expected result is Negative.  Fact Sheet for Patients:  EntrepreneurPulse.com.au  Fact Sheet for Healthcare Providers:  IncredibleEmployment.be  This test is no t yet approved or cleared by the [place] FDA and  has been authorized for detection and/or diagnosis of SARS-CoV-2 by FDA under an Emergency Use Authorization (EUA). This EUA will remain  in effect (meaning this test can be used) for the duration of the COVID-19 declaration under Section 564(b)(1) of the Act, 21 U.S.C.section 360bbb-3(b)(1), unless the authorization is terminated  or revoked sooner.       Influenza A by PCR NEGATIVE NEGATIVE Final   Influenza B by PCR NEGATIVE NEGATIVE Final     Comment: (NOTE) The Xpert Xpress SARS-CoV-2/FLU/RSV plus assay is intended as an aid in the diagnosis of influenza from Nasopharyngeal swab specimens and should not be used as a sole basis for treatment. Nasal washings and aspirates are unacceptable for Xpert Xpress SARS-CoV-2/FLU/RSV testing.  Fact Sheet for Patients: EntrepreneurPulse.com.au  Fact Sheet for Healthcare Providers: IncredibleEmployment.be  This test is not yet approved or cleared by the [place]  FDA and has been authorized for detection and/or diagnosis of SARS-CoV-2 by FDA under an Emergency Use Authorization (EUA). This EUA will remain in effect (meaning this test can be used) for the duration of the COVID-19 declaration under Section 564(b)(1) of the Act, 21 U.S.C. section 360bbb-3(b)(1), unless the authorization is terminated or revoked.  Performed at [facility], [address] [place], [place] [address]   MRSA PCR Screening     Status: None   Collection Time: 05/27/20  7:52 PM   Specimen: Nasopharyngeal  Result Value Ref Range Status   MRSA by PCR NEGATIVE NEGATIVE Final    Comment:        The GeneXpert MRSA Assay (FDA approved for NASAL specimens only), is one component of a comprehensive MRSA colonization surveillance program. It is not intended to diagnose MRSA infection nor to guide or monitor treatment for MRSA infections. Performed at [facility], [address] [place], [place] [address]   Urine culture     Status: None   Collection Time: 06/04/20 11:26 AM   Specimen: In/Out Cath Urine  Result Value Ref Range Status   Specimen Description   Final    IN/OUT CATH URINE Performed at [facility], [address] [place], [place] [address]    Special Requests   Final    NONE Performed at [facility], [address], [place], [place] [address]    Culture   Final    NO GROWTH Performed at [facility], [place] [address], [place], [place] [address]    Report Status 06/05/2020 FINAL  Final  Resp Panel by RT-PCR (Flu A&B, Covid) Nasopharyngeal Swab     Status: None   Collection Time: 06/04/20 11:26 AM   Specimen: Nasopharyngeal Swab; Nasopharyngeal(NP) swabs in vial transport medium  Result Value Ref Range Status   SARS Coronavirus 2 by RT PCR NEGATIVE NEGATIVE Final    Comment: (NOTE) SARS-CoV-2 target nucleic acids are NOT DETECTED.  The SARS-CoV-2 RNA is generally detectable in upper respiratory specimens during the acute phase of infection. The lowest concentration of SARS-CoV-2 viral copies this assay can detect is 138 copies/mL. A negative result does not preclude SARS-Cov-2 infection and should not be used as the sole basis for treatment or other patient management decisions. A negative result may occur with  improper specimen collection/handling, submission of specimen other than nasopharyngeal swab, presence of viral mutation(s) within the areas targeted by this assay, and inadequate number of viral copies(<138 copies/mL). A negative result must be combined with clinical observations, patient history, and epidemiological information. The expected result is Negative.  Fact Sheet for Patients:  EntrepreneurPulse.com.au  Fact Sheet for Healthcare Providers:  IncredibleEmployment.be  This test is no t yet approved or cleared by the [place] FDA and  has been authorized for detection and/or diagnosis of SARS-CoV-2 by FDA under an Emergency Use Authorization (EUA). This EUA will remain  in effect (meaning this test can be used) for the duration of the COVID-19 declaration under Section 564(b)(1) of the Act, 21 U.S.C.section 360bbb-3(b)(1), unless the authorization is terminated  or revoked sooner.       Influenza A by PCR NEGATIVE NEGATIVE Final   Influenza B by PCR NEGATIVE NEGATIVE Final    Comment: (NOTE) The Xpert Xpress  SARS-CoV-2/FLU/RSV plus assay is intended as an aid in the diagnosis of influenza from Nasopharyngeal swab specimens and should not be used as a sole basis for treatment. Nasal washings and aspirates  are unacceptable for Xpert Xpress SARS-CoV-2/FLU/RSV testing.  Fact Sheet for Patients: EntrepreneurPulse.com.au  Fact Sheet for Healthcare Providers: IncredibleEmployment.be  This test is not yet approved or cleared by the [place] FDA and has been authorized for detection and/or diagnosis of SARS-CoV-2 by FDA under an Emergency Use Authorization (EUA). This EUA will remain in effect (meaning this test can be used) for the duration of the COVID-19 declaration under Section 564(b)(1) of the Act, 21 U.S.C. section 360bbb-3(b)(1), unless the authorization is terminated or revoked.  Performed at [facility], [address], [place] [address]   Blood culture (routine x 2)     Status: None (Preliminary result)   Collection Time: 06/04/20 11:26 AM   Specimen: BLOOD  Result Value Ref Range Status   Specimen Description BLOOD LEFT ANTECUBITAL  Final   Special Requests   Final    BOTTLES DRAWN AEROBIC AND ANAEROBIC Blood Culture results may not be optimal due to an inadequate volume of blood received in culture bottles   Culture   Final    NO GROWTH < 24 HOURS Performed at [facility], [address] [place], [place] [address]    Report Status PENDING  Incomplete  Blood culture (routine x 2)     Status: None (Preliminary result)   Collection Time: 06/04/20 12:56 PM   Specimen: BLOOD  Result Value Ref Range Status   Specimen Description   Final    BLOOD Blood Culture results may not be optimal due to an inadequate volume of blood received in culture bottles   Special Requests   Final    BOTTLES DRAWN AEROBIC AND ANAEROBIC BLOOD RIGHT HAND   Culture   Final    NO GROWTH < 24 HOURS Performed at [facility],  [address] [place], [place] [address]    Report Status PENDING  Incomplete      Radiology Studies: CT HEAD WO CONTRAST  Result Date: 06/04/2020 CLINICAL DATA:  Mental status change EXAM: CT HEAD WITHOUT CONTRAST TECHNIQUE: Contiguous axial images were obtained from the base of the skull through the vertex without intravenous contrast. COMPARISON:  None. FINDINGS: Brain: No evidence of acute territorial infarction, hemorrhage, hydrocephalus,extra-axial collection or mass lesion/mass effect. There is dilatation the ventricles and sulci consistent with age-related atrophy. Low-attenuation changes in the deep white matter consistent with small vessel ischemia. Again noted is a lacunar infarct within the right corona radiata. Vascular: No hyperdense vessel or unexpected calcification. Skull: The skull is intact. No fracture or focal lesion identified. Sinuses/Orbits: There is opacification of the right frontal sinus with bilateral maxillary sinus mucosal thickening. The orbits and globes intact. Other: None IMPRESSION: No acute intracranial abnormality. 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 Stable sinus disease Electronically Signed   By: Prudencio Pair M.D.   On: 06/04/2020 15:56   DG Chest Port 1 View  Result Date: 06/04/2020 CLINICAL DATA:  Altered mental status.  Concern for sepsis EXAM: PORTABLE CHEST 1 VIEW COMPARISON:  May 27, 2020 FINDINGS: Lungs are clear. Heart upper normal in size with pulmonary vascularity normal. There is aortic atherosclerosis. Postoperative change noted in right shoulder. No evident adenopathy. IMPRESSION: Lungs clear. Heart upper normal in size. Aortic Atherosclerosis (ICD10-I70.0). Electronically Signed   By: Lowella Grip III M.D.   On: 06/04/2020 11:45     Scheduled Meds: . aspirin EC  81 mg Oral Daily  . atorvastatin  40  mg Oral BID  . Chlorhexidine Gluconate Cloth   6 each Topical Daily  . cholecalciferol  1,000 Units Oral Daily  . enoxaparin (LOVENOX) injection  40 mg Subcutaneous Q24H  . feeding supplement (GLUCERNA SHAKE)  237 mL Oral TID BM  . ferrous sulfate  325 mg Oral Once per day on Mon Wed Fri  . insulin aspart  0-15 Units Subcutaneous TID WC  . multivitamin with minerals  1 tablet Oral Daily  . ramipril  10 mg Oral QPM  . tamsulosin  0.4 mg Oral Daily   Continuous Infusions: . ceFEPime (MAXIPIME) IV Stopped (06/05/20 1356)     LOS: 1 day     Enzo Bi, MD [facility] If 7PM-7AM, please contact night-coverage 06/05/2020, 5:05 PM

## 2020-06-05 NOTE — ED Notes (Addendum)
Pt resting in bed. Wife at bedside. Needs met. Call light within reach. Fall precautions in place.

## 2020-06-06 DIAGNOSIS — N390 Urinary tract infection, site not specified: Secondary | ICD-10-CM

## 2020-06-06 DIAGNOSIS — Z9889 Other specified postprocedural states: Secondary | ICD-10-CM | POA: Diagnosis not present

## 2020-06-06 DIAGNOSIS — A419 Sepsis, unspecified organism: Principal | ICD-10-CM

## 2020-06-06 LAB — MAGNESIUM: Magnesium: 1.2 mg/dL — ABNORMAL LOW (ref 1.7–2.4)

## 2020-06-06 LAB — GLUCOSE, CAPILLARY
Glucose-Capillary: 196 mg/dL — ABNORMAL HIGH (ref 70–99)
Glucose-Capillary: 283 mg/dL — ABNORMAL HIGH (ref 70–99)
Glucose-Capillary: 185 mg/dL — ABNORMAL HIGH (ref 70–99)
Glucose-Capillary: 249 mg/dL — ABNORMAL HIGH (ref 70–99)

## 2020-06-06 LAB — BASIC METABOLIC PANEL
Anion gap: 9 (ref 5–15)
BUN: 11 mg/dL (ref 8–23)
CO2: 26 mmol/L (ref 22–32)
Calcium: 8.7 mg/dL — ABNORMAL LOW (ref 8.9–10.3)
Chloride: 102 mmol/L (ref 98–111)
Creatinine, Ser: 0.7 mg/dL (ref 0.61–1.24)
GFR, Estimated: >60 mL/min (ref >60)
Glucose, Bld: 186 mg/dL — ABNORMAL HIGH (ref 70–99)
Potassium: 3.5 mmol/L (ref 3.5–5.1)
Sodium: 137 mmol/L (ref 135–145)

## 2020-06-06 LAB — CBC
HCT: 35.1 % — ABNORMAL LOW (ref 39.0–52.0)
Hemoglobin: 12.2 g/dL — ABNORMAL LOW (ref 13.0–17.0)
MCH: 30.3 pg (ref 26.0–34.0)
MCHC: 34.8 g/dL (ref 30.0–36.0)
MCV: 87.1 fL (ref 80.0–100.0)
Platelets: 234 10*3/uL (ref 150–400)
RBC: 4.03 MIL/uL — ABNORMAL LOW (ref 4.22–5.81)
RDW: 11.9 % (ref 11.5–15.5)
WBC: 9 10*3/uL (ref 4.0–10.5)
nRBC: 0 % (ref 0.0–0.2)

## 2020-06-06 MED ORDER — MAGNESIUM SULFATE 4 GM/100ML IV SOLN
4.0000 g | Freq: Once | INTRAVENOUS | Status: AC
  Administered 2020-06-06: 4 g via INTRAVENOUS
  Filled 2020-06-06: qty 100

## 2020-06-06 MED ORDER — INSULIN ASPART 100 UNIT/ML ~~LOC~~ SOLN
4.0000 [IU] | Freq: Three times a day (TID) | SUBCUTANEOUS | Status: DC
  Administered 2020-06-07 – 2020-06-09 (×7): 4 [IU] via SUBCUTANEOUS
  Filled 2020-06-06 (×7): qty 1

## 2020-06-06 NOTE — TOC Initial Note (Signed)
Transition of Care [facility]) - Initial/Assessment Note    Patient Details  Name: Dustin Crane MRN: [id number] Date of Birth: 1940/04/25  Transition of [facility]) CM/SW Contact:    Shelbie Hutching, RN Phone Number: 06/06/2020, 2:56 PM  Clinical Narrative:                 Patient admitted to the hospital for sepsis due to UTI.  RNCM met with patient and patient's wife at the bedside this morning.  Patient is from home with wife, [name].  Patient goes by [place].  Mental status is much improved but wife still says she notices some aphagia.  Speech consult placed by MD for cognitive eval.  Patient is independent at home he was getting PT from [facility] and they would like to continue PT services with [facility] at discharge.  Joelene Millin with [facility] is aware of hospitalization.  Patient has canes, walker, and wheelchair at home but only ever uses a cane.  Patient does not drive but wife provides all transportation.  Patient is current with PCP Dr. Doy Hutching, no trouble getting prescriptions.   Patient not medically clear for discharge at this time.  TOC to follow.   Expected Discharge Plan: [place] Barriers to Discharge: Continued Medical Work up   Patient Goals and CMS Choice Patient states their goals for this hospitalization and ongoing recovery are:: Wife would like for patient to see speech for cognitive eval. CMS Medicare.gov Compare Post Acute Care list provided to:: Patient Choice offered to / list presented to : Patient  Expected Discharge Plan and Services Expected Discharge Plan: [place]   Discharge Planning Services: CM Consult Post Acute Care Choice: [place] arrangements for the past 2 months: Single Family Home                 DME Arranged: N/A DME Agency: NA       HH Arranged: PT,OT [place] Agency: [facility] Date [place]: 06/06/20 Time [place]: 61 Representative spoke with at [place]:  Joelene Millin  Prior Living Arrangements/Services Living arrangements for the past 2 months: [place] with:: Spouse Patient language and need for interpreter reviewed:: Yes Do you feel safe going back to the place where you live?: Yes      Need for Family Participation in Patient Care: Yes (Comment) Care giver support system in place?: Yes (comment) Current home services: DME (canes, walker, wheelchair) Criminal Activity/Legal Involvement Pertinent to Current Situation/Hospitalization: No - Comment as needed  Activities of Daily Living Home Assistive Devices/Equipment: Cane (specify quad or straight),Eyeglasses,Dentures (specify type) ADL Screening (condition at time of admission) Patient's cognitive ability adequate to safely complete daily activities?: Yes Is the patient deaf or have difficulty hearing?: No Does the patient have difficulty seeing, even when wearing glasses/contacts?: No Does the patient have difficulty concentrating, remembering, or making decisions?: No Patient able to express need for assistance with ADLs?: Yes Does the patient have difficulty dressing or bathing?: No Independently performs ADLs?: Yes (appropriate for developmental age) Does the patient have difficulty walking or climbing stairs?: Yes Weakness of Legs: Left Weakness of Arms/Hands: None  Permission Sought/Granted Permission sought to share information with : Case Manager,Family Supports,Other (comment) Permission granted to share information with : Yes, Verbal Permission Granted  Share Information with NAME: [name]  Permission granted to share info w AGENCY: [facility]  Permission granted to share info w Relationship: wife  Emotional Assessment Appearance:: Appears stated age Attitude/Demeanor/Rapport: Engaged Affect (typically observed): Accepting Orientation: : Oriented to Self,Oriented to Place,Oriented to  Time,Oriented to Situation Alcohol / Substance Use: Not  Applicable Psych Involvement: No (comment)  Admission diagnosis:  Lower urinary tract infectious disease [N39.0] Sepsis ([place]) [A41.9] Altered mental status, unspecified altered mental status type [R41.82] Sepsis, due to unspecified organism, unspecified whether acute organ dysfunction present [facility]) [A41.9] Patient Active Problem List   Diagnosis Date Noted  . Sepsis ([place]) 06/05/2020  . Acute metabolic encephalopathy 83/23/4688  . Sepsis secondary to UTI ([place]) 05/27/2020  . Delirium 05/27/2020  . Seizure ([place]) 05/27/2020  . Hypokalemia 05/27/2020  . Hypomagnesemia 05/27/2020  . Anemia, unspecified 11/27/2018  . CAD (coronary artery disease) 11/27/2018  . Complete rupture of rotator cuff 11/27/2018  . Disorder of bursae and tendons in shoulder region 11/27/2018  . Neck pain 11/27/2018  . Osteoarthritis 11/27/2018  . Sinoatrial node dysfunction ([place]) 11/27/2018  . Carotid stenosis 11/27/2018  . Other chest pain 10/26/2017  . Acute pain of left knee 04/21/2017  . Gastroesophageal reflux disease without esophagitis 05/06/2016  . Diabetes mellitus with hyperglycemia ([place]) 11/03/2015  . Mild mitral insufficiency 04/09/2015  . 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 Benign essential hypertension 07/26/2014  . Mixed hyperlipidemia 08/09/2013   PCP:  Idelle Crouch, MD Pharmacy:   [facility] [phone] [address], [place] [address] Phone: [phone] Fax: [place], [place] [address] Phone: [phone] Fax: [phone]     Social Determinants of Health (SDOH) Interventions    Readmission Risk Interventions No flowsheet data found.

## 2020-06-06 NOTE — Care Management Important Message (Signed)
Important Message  Patient Details  Name: Dustin Crane MRN: [id number] Date of Birth: 04-Apr-1940   Medicare Important Message Given:  Yes     Juliann Pulse A Paras Kreider 06/06/2020, 11:23 AM

## 2020-06-06 NOTE — Evaluation (Signed)
Clinical/Bedside Swallow Evaluation Patient Details  Name: Dustin Crane MRN: [id number] Date of Birth: [birth year]  Today's Date: 06/06/2020 Time: SLP Start Time (ACUTE ONLY): 1140 SLP Stop Time (ACUTE ONLY): 1200 SLP Time Calculation (min) (ACUTE ONLY): 20 min  Past Medical History:  Past Medical History:  Diagnosis Date  . Arthritis   . Cancer ([place])    skin cancer with tags removed  . Coronary artery disease   . Diabetes mellitus without complication ([place])   . GERD (gastroesophageal reflux disease)   . Hyperlipemia   . Hypertension   . Myocardial infarction [facility]) 2004   2 stents  . Peripheral vascular disease ([place])   . TIA (transient ischemic attack) 10/2018   PATIENT DENIES THIS DIAGNOSIS  . Wears glasses   . Wears hearing aid    both ears   Past Surgical History:  Past Surgical History:  Procedure Laterality Date  . ACHILLES TENDON SURGERY Right 12/07/2018   Procedure: ACHILLES TENDON REPAIR SECONDARY;  Surgeon: Albertine Patricia, DPM;  Location: [place];  Service: Podiatry;  Laterality: Right;  LMA LOCAL DIABETIC   block  . CALCANEAL OSTEOTOMY Right 12/07/2018   Procedure: PARTIAL CALCANECTOMY RIGHT;  Surgeon: Albertine Patricia, DPM;  Location: [place];  Service: Podiatry;  Laterality: Right;  . CARDIAC CATHETERIZATION  2005   stents x2  . CATARACT EXTRACTION W/PHACO Right 04/22/2020   Procedure: CATARACT EXTRACTION PHACO AND INTRAOCULAR LENS PLACEMENT (IOC) RIGHT DIABETIC 3.65 00:41.9;  Surgeon: Birder Robson, MD;  Location: [place];  Service: Ophthalmology;  Laterality: Right;  . CATARACT EXTRACTION W/PHACO Left 05/06/2020   Procedure: CATARACT EXTRACTION PHACO AND INTRAOCULAR LENS PLACEMENT ([place]) LEFT DIABETIC MYALUGIN;  Surgeon: Birder Robson, MD;  Location: [place];  Service: Ophthalmology;  Laterality: Left;  Dexycu 9%  LOT [id number]  EXP 10/05/2020 given @ 0757 inj OS  8.30 1:12.4  . COLONOSCOPY    .  COLONOSCOPY WITH PROPOFOL N/A 04/03/2020   Procedure: COLONOSCOPY WITH PROPOFOL;  Surgeon: [name], MD;  Location: [facility] ENDOSCOPY;  Service: Gastroenterology;  Laterality: N/A;  . CYSTOSCOPY WITH INSERTION OF UROLIFT N/A 05/19/2020   Procedure: CYSTOSCOPY WITH INSERTION OF UROLIFT;  Surgeon: Hollice Espy, MD;  Location: [facility];  Service: Urology;  Laterality: N/A;  . FINGER ARTHROPLASTY Right 12/05/2012   Procedure: RIGHT INDEX FINGER IMPLANT ARTHROPLASTY;  Surgeon: Cammie Sickle., MD;  Location: [place];  Service: Orthopedics;  Laterality: Right;  . HERNIA REPAIR  1990   rt ing   . QUADRICEPS TENDON REPAIR Left 02/11/2017   Procedure: REPAIR QUADRICEP TENDON;  Surgeon: Leim Fabry, MD;  Location: [facility];  Service: Orthopedics;  Laterality: Left;  . SHOULDER ACROMIOPLASTY  1967   [place]   . SHOULDER ARTHROSCOPY  2012   right  . TONSILLECTOMY     HPI:  80yo male admitted 06/04/20 with AMS. PMH: arthritis, DM, GERD, HTN, HLD, CAD, PVD, TIA (pt denies), MI. Recent dc from hospital following urinary procedure (05/19/20) with progressive decline since. CXR = lungs clear. MRI = no acute abnormality   Assessment / Plan / Recommendation Clinical Impression  Pt presents with functional oropharyngeal swallow. CN exam unremarkable, voice quality strong and clear. No oral issues or overt s/s aspiration observed or reported. Recommend continuing regular diet and thin liquids. No further ST intervention for swallowing recommended at this time.  SLP Visit Diagnosis: Dysphagia, unspecified (R13.10)    Aspiration Risk  Mild aspiration risk  Diet Recommendation Regular;Thin liquid   Liquid Administration via: Cup;Straw Medication Administration: Whole meds with liquid Supervision: Patient able to self feed Compensations: Slow rate;Small sips/bites Postural Changes: Seated upright at 90 degrees    Other  Recommendations Oral Care Recommendations: Oral care  BID   Follow up Recommendations None          Prognosis Prognosis for Safe Diet Advancement: Good      Swallow Study   General Date of Onset: 06/04/20 HPI: 80yo male admitted 06/04/20 with AMS. PMH: arthritis, DM, GERD, HTN, HLD, CAD, PVD, TIA (pt denies), MI. Recent dc from hospital following urinary procedure (05/19/20) with progressive decline since. CXR = lungs clear. MRI = no acute abnormality Type of Study: Bedside Swallow Evaluation Previous Swallow Assessment: none Diet Prior to this Study: Regular;Thin liquids Temperature Spikes Noted: No Respiratory Status: Room air History of Recent Intubation: No Behavior/Cognition: Alert;Cooperative;Pleasant mood Oral Cavity Assessment: Within Functional Limits Oral Care Completed by SLP: No Oral Cavity - Dentition: Adequate natural dentition Vision: Functional for self-feeding Self-Feeding Abilities: Able to feed self Patient Positioning: Upright in bed Baseline Vocal Quality: Normal Volitional Cough: Strong Volitional Swallow: Able to elicit    Oral/Motor/Sensory Function Overall Oral Motor/Sensory Function: Within functional limits   Ice Chips Ice chips: Not tested   Thin Liquid Thin Liquid: Within functional limits Presentation: Straw    Nectar Thick Nectar Thick Liquid: Not tested   Honey Thick Honey Thick Liquid: Not tested   Puree Puree: Within functional limits Presentation: Spoon;Self Fed   Solid     Solid: Within functional limits Presentation: Guthrie B. Quentin Ore, [facility], [place] Speech Language Pathologist  Shonna Chock 06/06/2020,12:34 PM

## 2020-06-06 NOTE — Evaluation (Signed)
Speech Language Pathology Evaluation Patient Details Name: Dustin Crane MRN: [id number] DOB: [birth year] Today's Date: 06/06/2020 Time: 1200-1220 SLP Time Calculation (min) (ACUTE ONLY): 20 min  Problem List:  Patient Active Problem List   Diagnosis Date Noted  . Sepsis ([place]) 06/05/2020  . Acute metabolic encephalopathy 50/11/3816  . Sepsis secondary to UTI ([place]) 05/27/2020  . Delirium 05/27/2020  . Seizure ([place]) 05/27/2020  . Hypokalemia 05/27/2020  . Hypomagnesemia 05/27/2020  . Anemia, unspecified 11/27/2018  . CAD (coronary artery disease) 11/27/2018  . Complete rupture of rotator cuff 11/27/2018  . Disorder of bursae and tendons in shoulder region 11/27/2018  . Neck pain 11/27/2018  . Osteoarthritis 11/27/2018  . Sinoatrial node dysfunction ([place]) 11/27/2018  . Carotid stenosis 11/27/2018  . Other chest pain 10/26/2017  . Acute pain of left knee 04/21/2017  . Gastroesophageal reflux disease without esophagitis 05/06/2016  . Diabetes mellitus with hyperglycemia ([place]) 11/03/2015  . Mild mitral insufficiency 04/09/2015  . 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 Benign essential hypertension 07/26/2014  . Mixed hyperlipidemia 08/09/2013   Past Medical History:  Past Medical History:  Diagnosis Date  . Arthritis   . Cancer ([place])    skin cancer with tags removed  . Coronary artery disease   . Diabetes mellitus without complication ([place])   . GERD (gastroesophageal reflux disease)   . Hyperlipemia   . Hypertension   . Myocardial infarction [facility]) 2004   2 stents  . Peripheral vascular disease ([place])   . TIA (transient ischemic attack) 10/2018   PATIENT DENIES THIS DIAGNOSIS  . Wears glasses   . Wears hearing aid    both ears   Past Surgical History:  Past Surgical History:  Procedure Laterality Date  . ACHILLES TENDON SURGERY Right 12/07/2018   Procedure: ACHILLES TENDON REPAIR SECONDARY;  Surgeon: Albertine Patricia, DPM;  Location: [place];  Service: Podiatry;   Laterality: Right;  LMA LOCAL DIABETIC   block  . CALCANEAL OSTEOTOMY Right 12/07/2018   Procedure: PARTIAL CALCANECTOMY RIGHT;  Surgeon: Albertine Patricia, DPM;  Location: [place];  Service: Podiatry;  Laterality: Right;  . CARDIAC CATHETERIZATION  2005   stents x2  . CATARACT EXTRACTION W/PHACO Right 04/22/2020   Procedure: CATARACT EXTRACTION PHACO AND INTRAOCULAR LENS PLACEMENT (IOC) RIGHT DIABETIC 3.65 00:41.9;  Surgeon: Birder Robson, MD;  Location: [place];  Service: Ophthalmology;  Laterality: Right;  . CATARACT EXTRACTION W/PHACO Left 05/06/2020   Procedure: CATARACT EXTRACTION PHACO AND INTRAOCULAR LENS PLACEMENT ([place]) LEFT DIABETIC MYALUGIN;  Surgeon: Birder Robson, MD;  Location: [place];  Service: Ophthalmology;  Laterality: Left;  Dexycu 9%  LOT [id number]  EXP 10/05/2020 given @ 0757 inj OS  8.30 1:12.4  . COLONOSCOPY    . COLONOSCOPY WITH PROPOFOL N/A 04/03/2020   Procedure: COLONOSCOPY WITH PROPOFOL;  Surgeon: [name], MD;  Location: [facility] ENDOSCOPY;  Service: Gastroenterology;  Laterality: N/A;  . CYSTOSCOPY WITH INSERTION OF UROLIFT N/A 05/19/2020   Procedure: CYSTOSCOPY WITH INSERTION OF UROLIFT;  Surgeon: Hollice Espy, MD;  Location: [facility];  Service: Urology;  Laterality: N/A;  . FINGER ARTHROPLASTY Right 12/05/2012   Procedure: RIGHT INDEX FINGER IMPLANT ARTHROPLASTY;  Surgeon: Cammie Sickle., MD;  Location: [place];  Service: Orthopedics;  Laterality: Right;  . HERNIA REPAIR  1990   rt ing   . QUADRICEPS TENDON REPAIR Left 02/11/2017   Procedure: REPAIR QUADRICEP TENDON;  Surgeon: Leim Fabry, MD;  Location: [facility];  Service: Orthopedics;  Laterality: Left;  .  SHOULDER ACROMIOPLASTY  1967   [place]   . SHOULDER ARTHROSCOPY  2012   right  . TONSILLECTOMY     HPI:  80yo male admitted 06/04/20 with AMS. PMH: arthritis, DM, GERD, HTN, HLD, CAD, PVD, TIA (pt denies), MI. Recent dc from  hospital following urinary procedure (05/19/20) with progressive decline since. CXR = lungs clear. MRI = no acute abnormality   Assessment / Plan / Recommendation Clinical Impression  The [place] Mental Status (SLUMS) Examination was administered. Pt scored 15/30 raising concern for neurocognitive deficits (n=27+/30). Points were lost on orientation, mental math, thought organization, delayed recall (4/5 remembered without cue), clock drawing, and auditory attention and recall of paragraph information. Deficits in these areas may aversely affect pt safety and independence. While the  severity of difficulty at this time may be exacerbated by current infection, there may be some residual deficits that should be re-evaluated on an outpatient basis to maximize pt independence and decrease caregiver burden. No further acute ST needs at this time. Pt and wife were informed of this recommendation, and were both in agreement.     SLP Assessment  SLP Recommendation/Assessment: All further Speech Language Pathology needs can be addressed in the next venue of care  SLP Visit Diagnosis: Cognitive communication deficit (R41.841)    Follow Up Recommendations  Outpatient SLP       SLP Evaluation Cognition  Overall Cognitive Status: Impaired/Different from baseline Arousal/Alertness: Awake/alert Orientation Level: Oriented to person;Oriented to place;Disoriented to time;Disoriented to situation Attention: Focused;Sustained Focused Attention: Appears intact Sustained Attention: Impaired Sustained Attention Impairment: Verbal basic       Comprehension  Auditory Comprehension Overall Auditory Comprehension: Appears within functional limits for tasks assessed    Expression Expression Primary Mode of Expression: Verbal Verbal Expression Overall Verbal Expression: Appears within functional limits for tasks assessed Written Expression Dominant Hand: Right   Oral / Motor  Oral Motor/Sensory  Function Overall Oral Motor/Sensory Function: Within functional limits Motor Speech Overall Motor Speech: Appears within functional limits for tasks assessed   GO                   Itay Mella B. Quentin Ore, [facility], [place] Speech Language Pathologist  Shonna Chock 06/06/2020, 12:42 PM

## 2020-06-06 NOTE — Consult Note (Signed)
NAME: Dustin Crane  DOB: 09-Sep-1940  MRN: [id number]  Date/Time: 06/06/2020 1:48 PM  REQUESTING PROVIDER: Billie Ruddy Subjective:  REASON FOR CONSULT: sepsis ? Dustin Crane is a 81 y.o. male with a history of DM, HTN, BPH underwent urolift on 05/19/20 presented to the ED on 06/04/20 with altered mental status Pt underwent Urolift for BPH with LUTS on 05/19/20. He was sent home with foley, which was removed on 05/21/20 in urology ofice in the morning. He presented to the office in the afternoon unable to urinate and in excruciating pain. Another foley was placed and 642ml of urine was released. He continued to have bladder spasms at home and presented as walk-in to urology on 05/26/20. The foley was removed and he was given oxybutynin. Pt continued to have excruciating pain at home his urine output was lik a dribble- they attempted self cath but couldn't, so he went back to the clinic. As per his wife his body went stiff and he seemed to have had tonic clonic movts. Syncope VS seizure was the D.d A rapid response was called and he was transported to ED. He was admitted and seen by neurologist Who wrote Likely UTI -Sleep deprivation -Leukocytosis (reactive secondary to seizure versus infection) -Metabolic acidosis (lactic acidosis secondary to seizure, potential component of DKA/HHS) -Possible focal seizure with secondary generalization versus provoked generalized tonic-clonic seizure in the setting of infection and sleep deprivation versus post syncopal convulsion in the setting of vasovagal syncope secondary to pain He improved without any seizure meds- He was given IV cefepime for UTI with citrobacter freundii and sent home on 05/30/20 with cipro for 8 days- pt has been taking this antibiotic regularly As per his wife pt was found in the morning of 06/04/20 in the living room sofa curled up and confused  And EMS was called and he was brought to the hospital. In the Ed temp was 101.7, BP 141/80, HR 121 sats  94% WBC 13, HB 11.5, PLT 242. UC and BC sent and he was started on cefepime and IV fluids As per his wife he became normal within a few hours in the afternoon. Urine culture and blood culture normal. I am asked to see him for antibiotic management Pt feels much better Passing urine well He does not know what happened to bring him to the hospital   Past Medical History:  Diagnosis Date  . Arthritis   . Cancer ([place])    skin cancer with tags removed  . Coronary artery disease   . Diabetes mellitus without complication ([place])   . GERD (gastroesophageal reflux disease)   . Hyperlipemia   . Hypertension   . Myocardial infarction [facility]) 2004   2 stents  . Peripheral vascular disease ([place])   . TIA (transient ischemic attack) 10/2018   PATIENT DENIES THIS DIAGNOSIS  . Wears glasses   . Wears hearing aid    both ears    Past Surgical History:  Procedure Laterality Date  . ACHILLES TENDON SURGERY Right 12/07/2018   Procedure: ACHILLES TENDON REPAIR SECONDARY;  Surgeon: Albertine Patricia, DPM;  Location: [place];  Service: Podiatry;  Laterality: Right;  LMA LOCAL DIABETIC   block  . CALCANEAL OSTEOTOMY Right 12/07/2018   Procedure: PARTIAL CALCANECTOMY RIGHT;  Surgeon: Albertine Patricia, DPM;  Location: [place];  Service: Podiatry;  Laterality: Right;  . CARDIAC CATHETERIZATION  2005   stents x2  . CATARACT EXTRACTION W/PHACO Right 04/22/2020   Procedure: CATARACT EXTRACTION PHACO AND INTRAOCULAR  LENS PLACEMENT ([facility]) RIGHT DIABETIC 3.65 00:41.9;  Surgeon: Birder Robson, MD;  Location: [place];  Service: Ophthalmology;  Laterality: Right;  . CATARACT EXTRACTION W/PHACO Left 05/06/2020   Procedure: CATARACT EXTRACTION PHACO AND INTRAOCULAR LENS PLACEMENT ([place]) LEFT DIABETIC MYALUGIN;  Surgeon: Birder Robson, MD;  Location: [place];  Service: Ophthalmology;  Laterality: Left;  Dexycu 9%  LOT [id number]  EXP 10/05/2020 given @ 0757 inj  OS  8.30 1:12.4  . COLONOSCOPY    . COLONOSCOPY WITH PROPOFOL N/A 04/03/2020   Procedure: COLONOSCOPY WITH PROPOFOL;  Surgeon: [name], MD;  Location: [facility] ENDOSCOPY;  Service: Gastroenterology;  Laterality: N/A;  . CYSTOSCOPY WITH INSERTION OF UROLIFT N/A 05/19/2020   Procedure: CYSTOSCOPY WITH INSERTION OF UROLIFT;  Surgeon: Hollice Espy, MD;  Location: [facility];  Service: Urology;  Laterality: N/A;  . FINGER ARTHROPLASTY Right 12/05/2012   Procedure: RIGHT INDEX FINGER IMPLANT ARTHROPLASTY;  Surgeon: Cammie Sickle., MD;  Location: [place];  Service: Orthopedics;  Laterality: Right;  . HERNIA REPAIR  1990   rt ing   . QUADRICEPS TENDON REPAIR Left 02/11/2017   Procedure: REPAIR QUADRICEP TENDON;  Surgeon: Leim Fabry, MD;  Location: [facility];  Service: Orthopedics;  Laterality: Left;  . SHOULDER ACROMIOPLASTY  1967   [place]   . SHOULDER ARTHROSCOPY  2012   right  . TONSILLECTOMY      Social History   Socioeconomic History  . Marital status: Married    Spouse name: mary  . Number of children: Not on file  . Years of education: Not on file  . Highest education level: Not on file  Occupational History  . Occupation: MILITARY    Comment: RETIRED  Tobacco Use  . Smoking status: Former Smoker    Quit date: 1978    Years since quitting: 44.2  . Smokeless tobacco: Never Used  Vaping Use  . Vaping Use: Never used  Substance and Sexual Activity  . Alcohol use: Yes    Comment: occ  . Drug use: No  . Sexual activity: Not on file  Other Topics Concern  . Not on file  Social History Narrative   Patient lives wife and a dog.   Feels safe in his home.   Starting to exercise since leg is beginning to heal.   Social Determinants of Health   Financial Resource Strain: Not on file  Food Insecurity: Not on file  Transportation Needs: Not on file  Physical Activity: Not on file  Stress: Not on file  Social Connections: Not on file   Intimate Partner Violence: Not on file    Family History  Problem Relation Age of Onset  . Coronary artery disease Mother   . Diabetes Mother   . Alzheimer's disease Mother   . Heart disease Father   . Diabetes Father    Allergies  Allergen Reactions  . Haemophilus Influenzae Anaphylaxis, Shortness Of Breath and Rash    Rash-severe n/v/d Ended up in hospital after taking!   . Hemophilus B Polysaccharide Vaccine Anaphylaxis, Shortness Of Breath and Rash    Rash-severe n/v/d   . Influenza Vac Split [Influenza Virus Vaccine] Shortness Of Breath and Other (See Comments)    Rash-severe n/v/d  . Hydroxyzine     Patient denies?????  . Compazine [Prochlorperazine Edisylate] Other (See Comments)    Muscle tightness   I? Current Facility-Administered Medications  Medication Dose Route Frequency Provider Last Rate Last Admin  . acetaminophen (TYLENOL)  tablet 650 mg  650 mg Oral Q6H PRN Agbata, Tochukwu, MD   650 mg at 06/04/20 2315   Or  . acetaminophen (TYLENOL) suppository 650 mg  650 mg Rectal Q6H PRN Agbata, Tochukwu, MD   650 mg at 06/04/20 1751  . aspirin EC tablet 81 mg  81 mg Oral Daily Enzo Bi, MD   81 mg at 06/06/20 0749  . atorvastatin (LIPITOR) tablet 40 mg  40 mg Oral BID Agbata, Tochukwu, MD   40 mg at 06/06/20 0749  . ceFEPIme (MAXIPIME) 2 g in sodium chloride 0.9 % 100 mL IVPB  2 g Intravenous Q8H Agbata, Tochukwu, MD 200 mL/hr at 06/06/20 0605 2 g at 06/06/20 0605  . cholecalciferol (VITAMIN D3) tablet 1,000 Units  1,000 Units Oral Daily Agbata, Tochukwu, MD   1,000 Units at 06/06/20 0749  . enoxaparin (LOVENOX) injection 40 mg  40 mg Subcutaneous Q24H Agbata, Tochukwu, MD   40 mg at 06/05/20 2045  . feeding supplement (GLUCERNA SHAKE) (GLUCERNA SHAKE) liquid 237 mL  237 mL Oral TID BM Enzo Bi, MD   237 mL at 06/05/20 2046  . ferrous sulfate tablet 325 mg  325 mg Oral Once per day on Mon Wed Fri Collier Bullock, MD   325 mg at 06/06/20 0749  . insulin aspart  (novoLOG) injection 0-15 Units  0-15 Units Subcutaneous TID WC Enzo Bi, MD   8 Units at 06/06/20 1249  . magnesium sulfate IVPB 4 g 100 mL  4 g Intravenous Once Enzo Bi, MD 50 mL/hr at 06/06/20 1248 4 g at 06/06/20 1248  . multivitamin with minerals tablet 1 tablet  1 tablet Oral Daily Agbata, Tochukwu, MD   1 tablet at 06/06/20 0749  . ondansetron (ZOFRAN) tablet 4 mg  4 mg Oral Q6H PRN Agbata, Tochukwu, MD       Or  . ondansetron (ZOFRAN) injection 4 mg  4 mg Intravenous Q6H PRN Agbata, Tochukwu, MD      . oxybutynin (DITROPAN) tablet 5 mg  5 mg Oral Q8H PRN Agbata, Tochukwu, MD      . ramipril (ALTACE) capsule 10 mg  10 mg Oral QPM Agbata, Tochukwu, MD   10 mg at 06/05/20 1703  . tamsulosin (FLOMAX) capsule 0.4 mg  0.4 mg Oral Daily Agbata, Tochukwu, MD   0.4 mg at 06/06/20 0749     Abtx:  Anti-infectives (From admission, onward)   Start     Dose/Rate Route Frequency Ordered Stop   06/04/20 2000  ceFEPIme (MAXIPIME) 2 g in sodium chloride 0.9 % 100 mL IVPB        2 g 200 mL/hr over 30 Minutes Intravenous Every 8 hours 06/04/20 1435     06/04/20 1430  ceFEPIme (MAXIPIME) 2 g in sodium chloride 0.9 % 100 mL IVPB  Status:  Discontinued        2 g 200 mL/hr over 30 Minutes Intravenous  Once 06/04/20 1427 06/04/20 1433   06/04/20 1115  ceFEPIme (MAXIPIME) 2 g in sodium chloride 0.9 % 100 mL IVPB        2 g 200 mL/hr over 30 Minutes Intravenous  Once 06/04/20 1114 06/04/20 1330   06/04/20 1115  metroNIDAZOLE (FLAGYL) IVPB 500 mg        500 mg 100 mL/hr over 60 Minutes Intravenous  Once 06/04/20 1114 06/04/20 1348   06/04/20 1115  vancomycin (VANCOCIN) IVPB 1000 mg/200 mL premix        1,000 mg 200 mL/hr over 60  Minutes Intravenous  Once 06/04/20 1114 06/04/20 1455      REVIEW OF SYSTEMS:  Const: negative fever, negative chills, negative weight loss Eyes: negative diplopia or visual changes, negative eye pain ENT: negative coryza, negative sore throat Resp: negative cough,  hemoptysis, dyspnea Cards: negative for chest pain, palpitations, lower extremity edema GU: had dysuria, bladder spasms but not anymore GI: Negative for abdominal pain, diarrhea, bleeding, constipation Skin: negative for rash and pruritus Heme: negative for easy bruising and gum/nose bleeding MS: negative for myalgias, arthralgias, back pain and muscle weakness Neurolo: confusion,  Psych: negative for feelings of anxiety, depression  Endocrine: DM Allergy/Immunology-as above: Objective:  VITALS:  BP 123/71 (BP Location: Left Arm)   Pulse 84   Temp 97.7 F (36.5 C) (Oral)   Resp 17   Ht 5\' 8"  (1.727 m)   Wt 77.8 kg   SpO2 93%   BMI 26.08 kg/m  PHYSICAL EXAM:  General: Alert, cooperative, no distress, appears stated age.  Head: Normocephalic, without obvious abnormality, atraumatic. Eyes: Conjunctivae clear, anicteric sclerae. Pupils are equal ENT Nares normal. No drainage or sinus tenderness. Lips, mucosa, and tongue normal. No Thrush Neck: Supple, symmetrical, no adenopathy, thyroid: non tender no carotid bruit and no JVD. Back: No CVA tenderness. Lungs: Clear to auscultation bilaterally. No Wheezing or Rhonchi. No rales. Heart: Regular rate and rhythm, no murmur, rub or gallop. Abdomen: Soft, non-tender,not distended. Bowel sounds normal. No masses Extremities: atraumatic, no cyanosis. No edema. No clubbing Skin: No rashes or lesions. Or bruising Lymph: Cervical, supraclavicular normal. Neurologic: Grossly non-focal Pertinent Labs Lab Results CBC    Component Value Date/Time   WBC 9.0 06/06/2020 0430   RBC 4.03 (L) 06/06/2020 0430   HGB 12.2 (L) 06/06/2020 0430   HCT 35.1 (L) 06/06/2020 0430   PLT 234 06/06/2020 0430   MCV 87.1 06/06/2020 0430   MCH 30.3 06/06/2020 0430   MCHC 34.8 06/06/2020 0430   RDW 11.9 06/06/2020 0430   LYMPHSABS 1.1 06/04/2020 1126   MONOABS 0.5 06/04/2020 1126   EOSABS 0.0 06/04/2020 1126   BASOSABS 0.1 06/04/2020 1126    CMP Latest  Ref Rng & Units 06/06/2020 06/05/2020 06/04/2020  Glucose 70 - 99 mg/dL 186(H) 227(H) 307(H)  BUN 8 - 23 mg/dL 11 14 14   Creatinine 0.61 - 1.24 mg/dL 0.70 0.89 0.95  Sodium 135 - 145 mmol/L 137 135 135  Potassium 3.5 - 5.1 mmol/L 3.5 3.3(L) 4.4  Chloride 98 - 111 mmol/L 102 101 97(L)  CO2 22 - 32 mmol/L 26 25 26   Calcium 8.9 - 10.3 mg/dL 8.7(L) 8.6(L) 10.0  Total Protein 6.5 - 8.1 g/dL - - 7.8  Total Bilirubin 0.3 - 1.2 mg/dL - - 1.0  Alkaline Phos 38 - 126 U/L - - 84  AST 15 - 41 U/L - - 26  ALT 0 - 44 U/L - - 30      Microbiology: Recent Results (from the past 240 hour(s))  Blood culture (routine x 2)     Status: None   Collection Time: 05/27/20  2:41 PM   Specimen: BLOOD  Result Value Ref Range Status   Specimen Description BLOOD LEFT ANTECUBITAL  Final   Special Requests   Final    BOTTLES DRAWN AEROBIC AND ANAEROBIC Blood Culture results may not be optimal due to an excessive volume of blood received in culture bottles   Culture   Final    NO GROWTH 5 DAYS Performed at [facility], [address] [place], [place] [address]    Report Status 06/01/2020 FINAL  Final  Blood culture (routine x 2)     Status: None   Collection Time: 05/27/20  3:12 PM   Specimen: BLOOD  Result Value Ref Range Status   Specimen Description BLOOD LEFT ANTECUBITAL  Final   Special Requests   Final    BOTTLES DRAWN AEROBIC AND ANAEROBIC Blood Culture adequate volume   Culture   Final    NO GROWTH 5 DAYS Performed at [facility], [address] [place], [place] [address]    Report Status 06/01/2020 FINAL  Final  Resp Panel by RT-PCR (Flu A&B, Covid) Nasopharyngeal Swab     Status: None   Collection Time: 05/27/20  4:42 PM   Specimen: Nasopharyngeal Swab; Nasopharyngeal(NP) swabs in vial transport medium  Result Value Ref Range Status   SARS Coronavirus 2 by RT PCR NEGATIVE NEGATIVE Final    Comment: (NOTE) SARS-CoV-2 target nucleic acids are NOT DETECTED.  The  SARS-CoV-2 RNA is generally detectable in upper respiratory specimens during the acute phase of infection. The lowest concentration of SARS-CoV-2 viral copies this assay can detect is 138 copies/mL. A negative result does not preclude SARS-Cov-2 infection and should not be used as the sole basis for treatment or other patient management decisions. A negative result may occur with  improper specimen collection/handling, submission of specimen other than nasopharyngeal swab, presence of viral mutation(s) within the areas targeted by this assay, and inadequate number of viral copies(<138 copies/mL). A negative result must be combined with clinical observations, patient history, and epidemiological information. The expected result is Negative.  Fact Sheet for Patients:  EntrepreneurPulse.com.au  Fact Sheet for Healthcare Providers:  IncredibleEmployment.be  This test is no t yet approved or cleared by the [place] FDA and  has been authorized for detection and/or diagnosis of SARS-CoV-2 by FDA under an Emergency Use Authorization (EUA). This EUA will remain  in effect (meaning this test can be used) for the duration of the COVID-19 declaration under Section 564(b)(1) of the Act, 21 U.S.C.section 360bbb-3(b)(1), unless the authorization is terminated  or revoked sooner.       Influenza A by PCR NEGATIVE NEGATIVE Final   Influenza B by PCR NEGATIVE NEGATIVE Final    Comment: (NOTE) The Xpert Xpress SARS-CoV-2/FLU/RSV plus assay is intended as an aid in the diagnosis of influenza from Nasopharyngeal swab specimens and should not be used as a sole basis for treatment. Nasal washings and aspirates are unacceptable for Xpert Xpress SARS-CoV-2/FLU/RSV testing.  Fact Sheet for Patients: EntrepreneurPulse.com.au  Fact Sheet for Healthcare Providers: IncredibleEmployment.be  This test is not yet approved or  cleared by the [place] FDA and has been authorized for detection and/or diagnosis of SARS-CoV-2 by FDA under an Emergency Use Authorization (EUA). This EUA will remain in effect (meaning this test can be used) for the duration of the COVID-19 declaration under Section 564(b)(1) of the Act, 21 U.S.C. section 360bbb-3(b)(1), unless the authorization is terminated or revoked.  Performed at [facility], [address] [place], [place] [address]   MRSA PCR Screening     Status: None   Collection Time: 05/27/20  7:52 PM   Specimen: Nasopharyngeal  Result Value Ref Range Status   MRSA by PCR NEGATIVE NEGATIVE Final    Comment:        The GeneXpert MRSA Assay (FDA approved for NASAL specimens only), is one component of a comprehensive MRSA colonization surveillance program. It is  not intended to diagnose MRSA infection nor to guide or monitor treatment for MRSA infections. Performed at [facility], [address] [place], [place] [address]   Urine culture     Status: None   Collection Time: 06/04/20 11:26 AM   Specimen: In/Out Cath Urine  Result Value Ref Range Status   Specimen Description   Final    IN/OUT CATH URINE Performed at [facility], [address] [place], [place] [address]    Special Requests   Final    NONE Performed at [facility], [address], [place], [place] [address]    Culture   Final    NO GROWTH Performed at [facility], [place] [address] [place], [place] [address]    Report Status 06/05/2020 FINAL  Final  Resp Panel by RT-PCR (Flu A&B, Covid) Nasopharyngeal Swab     Status: None   Collection Time: 06/04/20 11:26 AM   Specimen: Nasopharyngeal Swab; Nasopharyngeal(NP) swabs in vial transport medium  Result Value Ref Range Status   SARS Coronavirus 2 by RT PCR NEGATIVE NEGATIVE Final    Comment: (NOTE) SARS-CoV-2 target nucleic acids are NOT DETECTED.  The SARS-CoV-2 RNA is generally  detectable in upper respiratory specimens during the acute phase of infection. The lowest concentration of SARS-CoV-2 viral copies this assay can detect is 138 copies/mL. A negative result does not preclude SARS-Cov-2 infection and should not be used as the sole basis for treatment or other patient management decisions. A negative result may occur with  improper specimen collection/handling, submission of specimen other than nasopharyngeal swab, presence of viral mutation(s) within the areas targeted by this assay, and inadequate number of viral copies(<138 copies/mL). A negative result must be combined with clinical observations, patient history, and epidemiological information. The expected result is Negative.  Fact Sheet for Patients:  EntrepreneurPulse.com.au  Fact Sheet for Healthcare Providers:  IncredibleEmployment.be  This test is no t yet approved or cleared by the [place] FDA and  has been authorized for detection and/or diagnosis of SARS-CoV-2 by FDA under an Emergency Use Authorization (EUA). This EUA will remain  in effect (meaning this test can be used) for the duration of the COVID-19 declaration under Section 564(b)(1) of the Act, 21 U.S.C.section 360bbb-3(b)(1), unless the authorization is terminated  or revoked sooner.       Influenza A by PCR NEGATIVE NEGATIVE Final   Influenza B by PCR NEGATIVE NEGATIVE Final    Comment: (NOTE) The Xpert Xpress SARS-CoV-2/FLU/RSV plus assay is intended as an aid in the diagnosis of influenza from Nasopharyngeal swab specimens and should not be used as a sole basis for treatment. Nasal washings and aspirates are unacceptable for Xpert Xpress SARS-CoV-2/FLU/RSV testing.  Fact Sheet for Patients: EntrepreneurPulse.com.au  Fact Sheet for Healthcare Providers: IncredibleEmployment.be  This test is not yet approved or cleared by the [place] FDA  and has been authorized for detection and/or diagnosis of SARS-CoV-2 by FDA under an Emergency Use Authorization (EUA). This EUA will remain in effect (meaning this test can be used) for the duration of the COVID-19 declaration under Section 564(b)(1) of the Act, 21 U.S.C. section 360bbb-3(b)(1), unless the authorization is terminated or revoked.  Performed at [facility], [address] [place], [place] [address]   Blood culture (routine x 2)     Status: None (Preliminary result)   Collection Time: 06/04/20 11:26 AM   Specimen: BLOOD  Result Value Ref Range Status   Specimen Description BLOOD  LEFT ANTECUBITAL  Final   Special Requests   Final    BOTTLES DRAWN AEROBIC AND ANAEROBIC Blood Culture results may not be optimal due to an inadequate volume of blood received in culture bottles   Culture   Final    NO GROWTH 2 DAYS Performed at [facility], [address] [place], [place] [address]    Report Status PENDING  Incomplete  Blood culture (routine x 2)     Status: None (Preliminary result)   Collection Time: 06/04/20 12:56 PM   Specimen: BLOOD  Result Value Ref Range Status   Specimen Description   Final    BLOOD Blood Culture results may not be optimal due to an inadequate volume of blood received in culture bottles   Special Requests   Final    BOTTLES DRAWN AEROBIC AND ANAEROBIC BLOOD RIGHT HAND   Culture   Final    NO GROWTH 2 DAYS Performed at [facility], [address] [place],  [address]    Report Status PENDING  Incomplete    IMAGING RESULTS: Kidneys are well visualized bilaterally within normal enhancement pattern and normal excretion. Small nonobstructing renal calculi are noted bilaterally. The area of abnormality in the left kidney is related to underlying scarring. No lesion is seen. The ureters are within normal limits without calculi. The bladder is well distended. Prostate is well visualized. Uro lift device is  noted in place. No periprostatic inflammatory changes noted I have personally reviewed the films ? Impression/Recommendation ?Pt with recent urolift procedure for BPH on 05/19/20, has 6 clips, had foley which after removal lead to retention and replacement, bladder spasms with severe pain,causing syncope VS seizure , had citrobacter UTI and treated with IV cefepime followed by PO cipro Presents from home with altered mental status and found to have feve rin the ED  Pts picture is a bit confusing- Altered mental status , sepsis like picture with fever, tachycardia while being treated for UTI on ciprofloxacin,  Urine culture and blood culture N Did he have SIRS with non infectious etiology like seizures, or medication induced Or is this residual UTI responding well to cefepime  day 10 of antibiotic since the first infection He has 6 clips in the bladder/prostate- prostatitis is a concern but normal Ct finding is reassuring Will discuss with Dr.Brandon whether he needs more than 2 weeks of antibiotic.  If not he will complete 14 days of treatment on Monday.  Post void residual is 127  Altered mental status completely resolved  Diabetes mellitus.  On insulin  Hypertension on ramipril   BPH on Flomax and oxybutynin Hyperlipidemia on Lipitor ? ___________________________________________________ Discussed with patient, and his wife in detail. Discussed with Dr. Billie Ruddy Note:  This document was prepared using Dragon voice recognition software and may include unintentional dictation errors.

## 2020-06-06 NOTE — Progress Notes (Signed)
Inpatient Diabetes Program Recommendations  AACE/ADA: New Consensus Statement on Inpatient Glycemic Control (2015)  Target Ranges:  Prepandial:   less than 140 mg/dL      Peak postprandial:   less than 180 mg/dL (1-2 hours)      Critically ill patients:  140 - 180 mg/dL   Results for JERRYL, HOLZHAUER (MRN [id number]) as of 06/06/2020 14:21  Ref. Range 06/06/2020 07:37 06/06/2020 12:29  Glucose-Capillary Latest Ref Range: 70 - 99 mg/dL 196 (H)  3 units NOVOLOG  283 (H)  8 units NOVOLOG     Home DM Meds: Amaryl 4 mg BID        Metformin 500 mg BID        Actos 30 mg Daily      Januvia 100 mg Daily   Current Orders: Novolog 0-15 units TID    MD- Note home oral DM meds are on hold  Please consider adding Novolog Meal Coverage while oral meds are on hold:  Novolog 4 units TID with meals  Hold if pt eats <50% of meal, Hold if pt NPO    --Will follow patient during hospitalization--  Wyn Quaker RN, MSN, CDE Diabetes Coordinator Inpatient Glycemic Control Team Team Pager: [phone] (8a-5p)

## 2020-06-06 NOTE — Progress Notes (Signed)
PROGRESS NOTE    Dustin Crane  ZHY:[id number] DOB: Jan 11, 1941 DOA: 06/04/2020 PCP: Idelle Crouch, MD  [id number]/[id number]-BB   Assessment & Plan:   Principal Problem:   Sepsis secondary to UTI [facility]) Active Problems:   Benign essential hypertension   CAD (coronary artery disease)   Diabetes mellitus with hyperglycemia ([place])   Gastroesophageal reflux disease without esophagitis   Acute metabolic encephalopathy   Sepsis ([place])   Dustin Crane is a 80 y.o. male with medical history significant  forhypertension, type II diabetes mellitus, coronary artery disease status post stent angioplasty, GERD, peripheral vascular disease and status post recent UroLift procedure which was done on 05/19/20. Patient was recently discharged from the hospital after treatment for sepsis from a UTI.  He also had delirium.  His urine culture during the last hospitalization yielded Citrobacter freundii and patient was treated with IV cefepime and transitioned to oral ciprofloxacin which he is yet to complete.  Per his wife his mental status was back to baseline upon his discharge home 3 days ago. Patient was in his usual state of health until morning of presentation when his wife found him confused and lethargic in the living room.  He had gone to bed in his usual state of health and per his wife had helped her with some activities around the house the day prior. She called EMS and patient was brought to the ER for further evaluation.  On presentaiton Patient was lethargic and opened eyes to painful stimuli.  Sepsis secondary to complicated UTI As evidenced by fever with a T-max of 101.7 tachycardia, leukocytosis, pyuria and elevated lactic acid level. Prior urine culture yielded Citrobacter freundii sensitive to cephalosporins. Appeared to have failed outpatient oral abx. --started on IV vacn, cefepime and flagyl on presentation, and IVF resuscitation. --urine cx from this admission is neg  growth Plan: --cont cefepime for now --ID consult today  Acute metabolic encephalopathy, resolved --likely due to infection.  Already improved the morning after presentation.  Sundowning and delirium --noted to be more confused at night.   --orientation and re-direct --If pt becomes severely agitated, can give IV haldol PRN  Diabetes mellitus, poorly controlled --recent A1c 8.1 --add mealtime 4u TID --ACHS and SSI TID  Coronary artery disease --cont home ASA, stain and fish oil  History of hypertension --cont rampiril  BPH --cont Flomax  Hypokalemia --monitor and replete with oral potassium   DVT prophylaxis: Lovenox SQ Code Status: Full code  Family Communication: brother in-law updated at bedside today Level of care: Med-Surg Dispo:   The patient is from: home Anticipated d/c is to: home Anticipated d/c date is: > 3 days Patient currently is not medically ready to d/c due to: failed outpatient oral abx for UTI, now need IV abx   Subjective and Interval History:  Foley removed yesterday, and pt was able to void.  Reported some pain with urination.  Nursing noted more confusion last night.   Objective: Vitals:   06/06/20 0355 06/06/20 0742 06/06/20 1259 06/06/20 1640  BP: (!) 156/82 (!) 156/82 123/71 (!) 160/94  Pulse: 66 90 84 76  Resp: 16 16 17 16   Temp: 97.8 F (36.6 C) 98.1 F (36.7 C) 97.7 F (36.5 C) 97.8 F (36.6 C)  TempSrc: Oral Oral Oral Oral  SpO2: 99% 95% 93% 98%  Weight: 77.8 kg     Height:        Intake/Output Summary (Last 24 hours) at 06/06/2020 1644 Last data filed at 06/06/2020 1420  Gross per 24 hour  Intake 1897.38 ml  Output 1615 ml  Net 282.38 ml   Filed Weights   06/04/20 1047 06/06/20 0355  Weight: 81.2 kg 77.8 kg    Examination:   Constitutional: NAD, alert, oriented to self and place HEENT: conjunctivae and lids normal, EOMI CV: No cyanosis.   RESP: normal respiratory effort, on RA Extremities: No effusions,  edema in BLE SKIN: warm, dry Neuro: II - XII grossly intact.   Psych: Normal mood and affect.     Data Reviewed: I have personally reviewed following labs and imaging studies  CBC: Recent Labs  Lab 06/04/20 1126 06/05/20 0411 06/06/20 0430  WBC 13.0* 13.2* 9.0  NEUTROABS 11.2*  --   --   HGB 14.0 11.5* 12.2*  HCT 39.9 32.2* 35.1*  MCV 86.6 86.3 87.1  PLT 302 242 295   Basic Metabolic Panel: Recent Labs  Lab 06/04/20 1126 06/05/20 0411 06/06/20 0430  NA 135 135 137  K 4.4 3.3* 3.5  CL 97* 101 102  CO2 26 25 26   GLUCOSE 307* 227* 186*  BUN 14 14 11   CREATININE 0.95 0.89 0.70  CALCIUM 10.0 8.6* 8.7*  MG  --   --  1.2*   GFR: Estimated Creatinine Clearance: 74.8 mL/min (by C-G formula based on SCr of 0.7 mg/dL). Liver Function Tests: Recent Labs  Lab 06/04/20 1126  AST 26  ALT 30  ALKPHOS 84  BILITOT 1.0  PROT 7.8  ALBUMIN 4.2   No results for input(s): LIPASE, AMYLASE in the last 168 hours. No results for input(s): AMMONIA in the last 168 hours. Coagulation Profile: Recent Labs  Lab 06/04/20 1126 06/05/20 0411  INR 1.1 1.2   Cardiac Enzymes: Recent Labs  Lab 06/05/20 0411  CKTOTAL 184   BNP (last 3 results) No results for input(s): PROBNP in the last 8760 hours. HbA1C: No results for input(s): HGBA1C in the last 72 hours. CBG: Recent Labs  Lab 06/05/20 1632 06/05/20 2109 06/06/20 0737 06/06/20 1229 06/06/20 1637  GLUCAP 275* 182* 196* 283* 185*   Lipid Profile: No results for input(s): CHOL, HDL, LDLCALC, TRIG, CHOLHDL, LDLDIRECT in the last 72 hours. Thyroid Function Tests: No results for input(s): TSH, T4TOTAL, FREET4, T3FREE, THYROIDAB in the last 72 hours. Anemia Panel: No results for input(s): VITAMINB12, FOLATE, FERRITIN, TIBC, IRON, RETICCTPCT in the last 72 hours. Sepsis Labs: Recent Labs  Lab 06/04/20 1126 06/04/20 1438 06/04/20 1749 06/05/20 0411  PROCALCITON  --   --   --  <0.10  LATICACIDVEN 2.3* 2.1* 2.0*  --      Recent Results (from the past 240 hour(s))  MRSA PCR Screening     Status: None   Collection Time: 05/27/20  7:52 PM   Specimen: Nasopharyngeal  Result Value Ref Range Status   MRSA by PCR NEGATIVE NEGATIVE Final    Comment:        The GeneXpert MRSA Assay (FDA approved for NASAL specimens only), is one component of a comprehensive MRSA colonization surveillance program. It is not intended to diagnose MRSA infection nor to guide or monitor treatment for MRSA infections. Performed at [facility], [address] [place], [place] [address]   Urine culture     Status: None   Collection Time: 06/04/20 11:26 AM   Specimen: In/Out Cath Urine  Result Value Ref Range Status   Specimen Description   Final    IN/OUT CATH URINE Performed at [facility], [place],  [place] [address]    Special Requests   Final    NONE Performed at [facility], [address] [place], [place] [address]    Culture   Final    NO GROWTH Performed at [facility], [place] [address] [place], [place] [address]    Report Status 06/05/2020 FINAL  Final  Resp Panel by RT-PCR (Flu A&B, Covid) Nasopharyngeal Swab     Status: None   Collection Time: 06/04/20 11:26 AM   Specimen: Nasopharyngeal Swab; Nasopharyngeal(NP) swabs in vial transport medium  Result Value Ref Range Status   SARS Coronavirus 2 by RT PCR NEGATIVE NEGATIVE Final    Comment: (NOTE) SARS-CoV-2 target nucleic acids are NOT DETECTED.  The SARS-CoV-2 RNA is generally detectable in upper respiratory specimens during the acute phase of infection. The lowest concentration of SARS-CoV-2 viral copies this assay can detect is 138 copies/mL. A negative result does not preclude SARS-Cov-2 infection and should not be used as the sole basis for treatment or other patient management decisions. A negative result may occur with  improper specimen collection/handling, submission of specimen  other than nasopharyngeal swab, presence of viral mutation(s) within the areas targeted by this assay, and inadequate number of viral copies(<138 copies/mL). A negative result must be combined with clinical observations, patient history, and epidemiological information. The expected result is Negative.  Fact Sheet for Patients:  EntrepreneurPulse.com.au  Fact Sheet for Healthcare Providers:  IncredibleEmployment.be  This test is no t yet approved or cleared by the [place] FDA and  has been authorized for detection and/or diagnosis of SARS-CoV-2 by FDA under an Emergency Use Authorization (EUA). This EUA will remain  in effect (meaning this test can be used) for the duration of the COVID-19 declaration under Section 564(b)(1) of the Act, 21 U.S.C.section 360bbb-3(b)(1), unless the authorization is terminated  or revoked sooner.       Influenza A by PCR NEGATIVE NEGATIVE Final   Influenza B by PCR NEGATIVE NEGATIVE Final    Comment: (NOTE) The Xpert Xpress SARS-CoV-2/FLU/RSV plus assay is intended as an aid in the diagnosis of influenza from Nasopharyngeal swab specimens and should not be used as a sole basis for treatment. Nasal washings and aspirates are unacceptable for Xpert Xpress SARS-CoV-2/FLU/RSV testing.  Fact Sheet for Patients: EntrepreneurPulse.com.au  Fact Sheet for Healthcare Providers: IncredibleEmployment.be  This test is not yet approved or cleared by the [place] FDA and has been authorized for detection and/or diagnosis of SARS-CoV-2 by FDA under an Emergency Use Authorization (EUA). This EUA will remain in effect (meaning this test can be used) for the duration of the COVID-19 declaration under Section 564(b)(1) of the Act, 21 U.S.C. section 360bbb-3(b)(1), unless the authorization is terminated or revoked.  Performed at [facility], [address]  [place], [place] [address]   Blood culture (routine x 2)     Status: None (Preliminary result)   Collection Time: 06/04/20 11:26 AM   Specimen: BLOOD  Result Value Ref Range Status   Specimen Description BLOOD LEFT ANTECUBITAL  Final   Special Requests   Final    BOTTLES DRAWN AEROBIC AND ANAEROBIC Blood Culture results may not be optimal due to an inadequate volume of blood received in culture bottles   Culture   Final    NO GROWTH 2 DAYS Performed at [facility], [address] [place], [place] [address]    Report Status PENDING  Incomplete  Blood culture (routine x 2)  Status: None (Preliminary result)   Collection Time: 06/04/20 12:56 PM   Specimen: BLOOD  Result Value Ref Range Status   Specimen Description   Final    BLOOD Blood Culture results may not be optimal due to an inadequate volume of blood received in culture bottles   Special Requests   Final    BOTTLES DRAWN AEROBIC AND ANAEROBIC BLOOD RIGHT HAND   Culture   Final    NO GROWTH 2 DAYS Performed at [facility], [address] [place], [place] [address]    Report Status PENDING  Incomplete      Radiology Studies: CT ABDOMEN PELVIS W CONTRAST  Result Date: 06/05/2020 CLINICAL DATA:  Recent uro lift procedure with possible sepsis, initial encounter EXAM: CT ABDOMEN AND PELVIS WITH CONTRAST TECHNIQUE: Multidetector CT imaging of the abdomen and pelvis was performed using the standard protocol following bolus administration of intravenous contrast. CONTRAST:  179mL OMNIPAQUE IOHEXOL 300 MG/ML  SOLN COMPARISON:  05/27/2020 FINDINGS: Lower chest: No acute abnormality. Hepatobiliary: Fatty infiltration of the liver is noted. The gallbladder is decompressed. Pancreas: Unremarkable. No pancreatic ductal dilatation or surrounding inflammatory changes. Spleen: Normal in size without focal abnormality. Adrenals/Urinary Tract: Adrenal glands are within normal limits. Kidneys are well visualized bilaterally  within normal enhancement pattern and normal excretion. Small nonobstructing renal calculi are noted bilaterally. The area of abnormality in the left kidney is related to underlying scarring. No lesion is seen. The ureters are within normal limits without calculi. The bladder is well distended. Stomach/Bowel: Colon shows no obstructive or inflammatory changes. The appendix is within normal limits. Small bowel is unremarkable. Wall thickening in the stomach is again noted stable from the prior exam. Vascular/Lymphatic: Aortic atherosclerosis. No enlarged abdominal or pelvic lymph nodes. Reproductive: Prostate is well visualized. Uro lift device is noted in place. No periprostatic inflammatory changes noted. Other: No abdominal wall hernia or abnormality. No abdominopelvic ascites. Musculoskeletal: No acute or significant osseous findings. IMPRESSION: No acute abnormality noted. Bilateral nonobstructing renal calculi. Status post uro lift procedure. Gastric wall thickening stable from the prior exam. Electronically Signed   By: Inez Catalina M.D.   On: 06/05/2020 23:38     Scheduled Meds: . aspirin EC  81 mg Oral Daily  . atorvastatin  40 mg Oral BID  . cholecalciferol  1,000 Units Oral Daily  . enoxaparin (LOVENOX) injection  40 mg Subcutaneous Q24H  . feeding supplement (GLUCERNA SHAKE)  237 mL Oral TID BM  . ferrous sulfate  325 mg Oral Once per day on Mon Wed Fri  . insulin aspart  0-15 Units Subcutaneous TID WC  . multivitamin with minerals  1 tablet Oral Daily  . ramipril  10 mg Oral QPM  . tamsulosin  0.4 mg Oral Daily   Continuous Infusions: . ceFEPime (MAXIPIME) IV 2 g (06/06/20 1558)     LOS: 2 days     Enzo Bi, MD [facility] If 7PM-7AM, please contact night-coverage 06/06/2020, 4:44 PM

## 2020-06-06 NOTE — Plan of Care (Signed)
Shift Summary: Pt Aox3, became more confused throughout the night. Fall precautions in place, pt impulsive getting OOB to urinate, urinal at bedside. Remains on RA, VSS. CT completed overnight. Q6hr bladder scans maintained, adequate UO though frequent urination with urgency. Rounding performed, needs/concerns addressed. PIV removed by patient, but patient w/o recollection and insistent that the CT tech pulled it out; became slightly agitated with reorientation attempts.   Problem: Education: Goal: Knowledge of General Education information will improve Description: Including pain rating scale, medication(s)/side effects and non-pharmacologic comfort measures Outcome: Progressing   Problem: Health Behavior/Discharge Planning: Goal: Ability to manage health-related needs will improve Outcome: Progressing   Problem: Clinical Measurements: Goal: Ability to maintain clinical measurements within normal limits will improve Outcome: Progressing Goal: Will remain free from infection Outcome: Progressing Goal: Diagnostic test results will improve Outcome: Progressing Goal: Respiratory complications will improve Outcome: Progressing Goal: Cardiovascular complication will be avoided Outcome: Progressing   Problem: Activity: Goal: Risk for activity intolerance will decrease Outcome: Progressing   Problem: Nutrition: Goal: Adequate nutrition will be maintained Outcome: Progressing   Problem: Coping: Goal: Level of anxiety will decrease Outcome: Progressing   Problem: Elimination: Goal: Will not experience complications related to bowel motility Outcome: Progressing Goal: Will not experience complications related to urinary retention Outcome: Progressing   Problem: Pain Managment: Goal: General experience of comfort will improve Outcome: Progressing   Problem: Safety: Goal: Ability to remain free from injury will improve Outcome: Progressing   Problem: Skin Integrity: Goal: Risk  for impaired skin integrity will decrease Outcome: Progressing

## 2020-06-07 DIAGNOSIS — N390 Urinary tract infection, site not specified: Secondary | ICD-10-CM | POA: Diagnosis not present

## 2020-06-07 DIAGNOSIS — A419 Sepsis, unspecified organism: Secondary | ICD-10-CM | POA: Diagnosis not present

## 2020-06-07 LAB — CBC
HCT: 37.8 % — ABNORMAL LOW (ref 39.0–52.0)
Hemoglobin: 13.5 g/dL (ref 13.0–17.0)
MCH: 30.6 pg (ref 26.0–34.0)
MCHC: 35.7 g/dL (ref 30.0–36.0)
MCV: 85.7 fL (ref 80.0–100.0)
Platelets: 255 10*3/uL (ref 150–400)
RBC: 4.41 MIL/uL (ref 4.22–5.81)
RDW: 12 % (ref 11.5–15.5)
WBC: 11 10*3/uL — ABNORMAL HIGH (ref 4.0–10.5)
nRBC: 0 % (ref 0.0–0.2)

## 2020-06-07 LAB — GLUCOSE, CAPILLARY
Glucose-Capillary: 181 mg/dL — ABNORMAL HIGH (ref 70–99)
Glucose-Capillary: 254 mg/dL — ABNORMAL HIGH (ref 70–99)
Glucose-Capillary: 187 mg/dL — ABNORMAL HIGH (ref 70–99)
Glucose-Capillary: 154 mg/dL — ABNORMAL HIGH (ref 70–99)

## 2020-06-07 LAB — MAGNESIUM: Magnesium: 2 mg/dL (ref 1.7–2.4)

## 2020-06-07 LAB — BASIC METABOLIC PANEL
Anion gap: 11 (ref 5–15)
BUN: 11 mg/dL (ref 8–23)
CO2: 25 mmol/L (ref 22–32)
Calcium: 8.6 mg/dL — ABNORMAL LOW (ref 8.9–10.3)
Chloride: 103 mmol/L (ref 98–111)
Creatinine, Ser: 0.65 mg/dL (ref 0.61–1.24)
GFR, Estimated: >60 mL/min (ref >60)
Glucose, Bld: 181 mg/dL — ABNORMAL HIGH (ref 70–99)
Potassium: 3.5 mmol/L (ref 3.5–5.1)
Sodium: 139 mmol/L (ref 135–145)

## 2020-06-07 MED ORDER — ALUM & MAG HYDROXIDE-SIMETH 200-200-20 MG/5ML PO SUSP
30.0000 mL | Freq: Four times a day (QID) | ORAL | Status: DC | PRN
  Administered 2020-06-07 – 2020-06-08 (×4): 30 mL via ORAL
  Filled 2020-06-07 (×4): qty 30

## 2020-06-07 NOTE — Plan of Care (Signed)

## 2020-06-07 NOTE — Progress Notes (Signed)
PROGRESS NOTE    Dustin Crane  LOV:[id number] DOB: 1940/04/02 DOA: 06/04/2020 PCP: Idelle Crouch, MD  [id number]/[id number]-BB   Assessment & Plan:   Principal Problem:   Sepsis secondary to UTI [facility]) Active Problems:   Benign essential hypertension   CAD (coronary artery disease)   Diabetes mellitus with hyperglycemia ([place])   Gastroesophageal reflux disease without esophagitis   Acute metabolic encephalopathy   Sepsis ([place])   Dustin Crane is a 80 y.o. male with medical history significant  forhypertension, type II diabetes mellitus, coronary artery disease status post stent angioplasty, GERD, peripheral vascular disease and status post recent UroLift procedure which was done on 05/19/20. Patient was recently discharged from the hospital after treatment for sepsis from a UTI.  He also had delirium.  His urine culture during the last hospitalization yielded Citrobacter freundii and patient was treated with IV cefepime and transitioned to oral ciprofloxacin which he is yet to complete.  Per his wife his mental status was back to baseline upon his discharge home 3 days ago. Patient was in his usual state of health until morning of presentation when his wife found him confused and lethargic in the living room.  He had gone to bed in his usual state of health and per his wife had helped her with some activities around the house the day prior. She called EMS and patient was brought to the ER for further evaluation.  On presentaiton Patient was lethargic and opened eyes to painful stimuli.  Sepsis secondary to complicated UTI As evidenced by fever with a T-max of 101.7 tachycardia, leukocytosis, pyuria and elevated lactic acid level. Prior urine culture yielded Citrobacter freundii sensitive to cephalosporins. Appeared to have failed outpatient oral abx. --started on IV vacn, cefepime and flagyl on presentation, and IVF resuscitation. --urine cx from this admission is neg growth --ID  consulted Plan: --cont cefepime to complete 14 days of total course  Acute metabolic encephalopathy, resolved --likely due to infection.  Already improved the morning after presentation.  Sundowning and delirium --noted to be more confused at night.   --orientation and re-direct --If pt becomes severely agitated, can give IV haldol PRN  Diabetes mellitus, poorly controlled --recent A1c 8.1 --cont mealtime 4u TID --ACHS and SSI TID  Coronary artery disease --cont home ASA, stain and fish oil  History of hypertension --cont ramipril  BPH --no retention after Foley removed --cont Flomax  Hypokalemia --replete with oral potassium PRN   DVT prophylaxis: Lovenox SQ Code Status: Full code  Family Communication: wife updated at bedside today Level of care: Med-Surg Dispo:   The patient is from: home Anticipated d/c is to: home  Anticipated d/c date is: Monday Patient currently is not medically ready to d/c due to: failed outpatient oral abx for UTI, now need IV abx until at least Monday   Subjective and Interval History:  Less confused last night.  Pt complained about the bed alarm, and wanted to go outside with wife's help.  No more fever.     Objective: Vitals:   06/07/20 0441 06/07/20 0727 06/07/20 1145 06/07/20 1203  BP: 135/68 (!) 145/97 (!) 149/85   Pulse: 64 79 76   Resp: 16 16 16 16   Temp: 98.7 F (37.1 C) 98.7 F (37.1 C)  98.4 F (36.9 C)  TempSrc: Oral Oral    SpO2: 100% 97% 94%   Weight:      Height:        Intake/Output Summary (Last 24 hours) at  06/07/2020 1438 Last data filed at 06/07/2020 1402 Gross per 24 hour  Intake 540.62 ml  Output 555 ml  Net -14.38 ml   Filed Weights   06/04/20 1047 06/06/20 0355  Weight: 81.2 kg 77.8 kg    Examination:   Constitutional: NAD, AAOx3, sitting in recliner HEENT: conjunctivae and lids normal, EOMI CV: No cyanosis.   RESP: normal respiratory effort, on RA Extremities: No effusions, edema in  BLE SKIN: warm, dry Neuro: II - XII grossly intact.   Psych: Normal mood and affect.  Appropriate judgement and reason   Data Reviewed: I have personally reviewed following labs and imaging studies  CBC: Recent Labs  Lab 06/04/20 1126 06/05/20 0411 06/06/20 0430 06/07/20 0427  WBC 13.0* 13.2* 9.0 11.0*  NEUTROABS 11.2*  --   --   --   HGB 14.0 11.5* 12.2* 13.5  HCT 39.9 32.2* 35.1* 37.8*  MCV 86.6 86.3 87.1 85.7  PLT 302 242 234 053   Basic Metabolic Panel: Recent Labs  Lab 06/04/20 1126 06/05/20 0411 06/06/20 0430 06/07/20 0427  NA 135 135 137 139  K 4.4 3.3* 3.5 3.5  CL 97* 101 102 103  CO2 26 25 26 25   GLUCOSE 307* 227* 186* 181*  BUN 14 14 11 11   CREATININE 0.95 0.89 0.70 0.65  CALCIUM 10.0 8.6* 8.7* 8.6*  MG  --   --  1.2* 2.0   GFR: Estimated Creatinine Clearance: 74.8 mL/min (by C-G formula based on SCr of 0.65 mg/dL). Liver Function Tests: Recent Labs  Lab 06/04/20 1126  AST 26  ALT 30  ALKPHOS 84  BILITOT 1.0  PROT 7.8  ALBUMIN 4.2   No results for input(s): LIPASE, AMYLASE in the last 168 hours. No results for input(s): AMMONIA in the last 168 hours. Coagulation Profile: Recent Labs  Lab 06/04/20 1126 06/05/20 0411  INR 1.1 1.2   Cardiac Enzymes: Recent Labs  Lab 06/05/20 0411  CKTOTAL 184   BNP (last 3 results) No results for input(s): PROBNP in the last 8760 hours. HbA1C: No results for input(s): HGBA1C in the last 72 hours. CBG: Recent Labs  Lab 06/06/20 1229 06/06/20 1637 06/06/20 2011 06/07/20 0727 06/07/20 1206  GLUCAP 283* 185* 249* 181* 254*   Lipid Profile: No results for input(s): CHOL, HDL, LDLCALC, TRIG, CHOLHDL, LDLDIRECT in the last 72 hours. Thyroid Function Tests: No results for input(s): TSH, T4TOTAL, FREET4, T3FREE, THYROIDAB in the last 72 hours. Anemia Panel: No results for input(s): VITAMINB12, FOLATE, FERRITIN, TIBC, IRON, RETICCTPCT in the last 72 hours. Sepsis Labs: Recent Labs  Lab  06/04/20 1126 06/04/20 1438 06/04/20 1749 06/05/20 0411  PROCALCITON  --   --   --  <0.10  LATICACIDVEN 2.3* 2.1* 2.0*  --     Recent Results (from the past 240 hour(s))  Urine culture     Status: None   Collection Time: 06/04/20 11:26 AM   Specimen: In/Out Cath Urine  Result Value Ref Range Status   Specimen Description   Final    IN/OUT CATH URINE Performed at [facility], [address] [place], [place] [address]    Special Requests   Final    NONE Performed at [facility], [address] [place], [place] [address]    Culture   Final    NO GROWTH Performed at [facility], [place] [address], [place], [place] [address]    Report Status 06/05/2020 FINAL  Final  Resp Panel by RT-PCR (Flu A&B,  Covid) Nasopharyngeal Swab     Status: None   Collection Time: 06/04/20 11:26 AM   Specimen: Nasopharyngeal Swab; Nasopharyngeal(NP) swabs in vial transport medium  Result Value Ref Range Status   SARS Coronavirus 2 by RT PCR NEGATIVE NEGATIVE Final    Comment: (NOTE) SARS-CoV-2 target nucleic acids are NOT DETECTED.  The SARS-CoV-2 RNA is generally detectable in upper respiratory specimens during the acute phase of infection. The lowest concentration of SARS-CoV-2 viral copies this assay can detect is 138 copies/mL. A negative result does not preclude SARS-Cov-2 infection and should not be used as the sole basis for treatment or other patient management decisions. A negative result may occur with  improper specimen collection/handling, submission of specimen other than nasopharyngeal swab, presence of viral mutation(s) within the areas targeted by this assay, and inadequate number of viral copies(<138 copies/mL). A negative result must be combined with clinical observations, patient history, and epidemiological information. The expected result is Negative.  Fact Sheet for Patients:  EntrepreneurPulse.com.au  Fact Sheet for Healthcare  Providers:  IncredibleEmployment.be  This test is no t yet approved or cleared by the [place] FDA and  has been authorized for detection and/or diagnosis of SARS-CoV-2 by FDA under an Emergency Use Authorization (EUA). This EUA will remain  in effect (meaning this test can be used) for the duration of the COVID-19 declaration under Section 564(b)(1) of the Act, 21 U.S.C.section 360bbb-3(b)(1), unless the authorization is terminated  or revoked sooner.       Influenza A by PCR NEGATIVE NEGATIVE Final   Influenza B by PCR NEGATIVE NEGATIVE Final    Comment: (NOTE) The Xpert Xpress SARS-CoV-2/FLU/RSV plus assay is intended as an aid in the diagnosis of influenza from Nasopharyngeal swab specimens and should not be used as a sole basis for treatment. Nasal washings and aspirates are unacceptable for Xpert Xpress SARS-CoV-2/FLU/RSV testing.  Fact Sheet for Patients: EntrepreneurPulse.com.au  Fact Sheet for Healthcare Providers: IncredibleEmployment.be  This test is not yet approved or cleared by the [place] FDA and has been authorized for detection and/or diagnosis of SARS-CoV-2 by FDA under an Emergency Use Authorization (EUA). This EUA will remain in effect (meaning this test can be used) for the duration of the COVID-19 declaration under Section 564(b)(1) of the Act, 21 U.S.C. section 360bbb-3(b)(1), unless the authorization is terminated or revoked.  Performed at [facility], [address] [place], [place] [address]   Blood culture (routine x 2)     Status: None (Preliminary result)   Collection Time: 06/04/20 11:26 AM   Specimen: BLOOD  Result Value Ref Range Status   Specimen Description BLOOD LEFT ANTECUBITAL  Final   Special Requests   Final    BOTTLES DRAWN AEROBIC AND ANAEROBIC Blood Culture results may not be optimal due to an inadequate volume of blood received in culture bottles    Culture   Final    NO GROWTH 3 DAYS Performed at [facility], [address] [place], [place] [address]    Report Status PENDING  Incomplete  Blood culture (routine x 2)     Status: None (Preliminary result)   Collection Time: 06/04/20 12:56 PM   Specimen: BLOOD  Result Value Ref Range Status   Specimen Description   Final    BLOOD Blood Culture results may not be optimal due to an inadequate volume of blood received in culture bottles   Special Requests   Final    BOTTLES DRAWN AEROBIC AND ANAEROBIC BLOOD  RIGHT HAND   Culture   Final    NO GROWTH 3 DAYS Performed at [facility], [address] [place], [place] [address]    Report Status PENDING  Incomplete      Radiology Studies: CT ABDOMEN PELVIS W CONTRAST  Result Date: 06/05/2020 CLINICAL DATA:  Recent uro lift procedure with possible sepsis, initial encounter EXAM: CT ABDOMEN AND PELVIS WITH CONTRAST TECHNIQUE: Multidetector CT imaging of the abdomen and pelvis was performed using the standard protocol following bolus administration of intravenous contrast. CONTRAST:  159mL OMNIPAQUE IOHEXOL 300 MG/ML  SOLN COMPARISON:  05/27/2020 FINDINGS: Lower chest: No acute abnormality. Hepatobiliary: Fatty infiltration of the liver is noted. The gallbladder is decompressed. Pancreas: Unremarkable. No pancreatic ductal dilatation or surrounding inflammatory changes. Spleen: Normal in size without focal abnormality. Adrenals/Urinary Tract: Adrenal glands are within normal limits. Kidneys are well visualized bilaterally within normal enhancement pattern and normal excretion. Small nonobstructing renal calculi are noted bilaterally. The area of abnormality in the left kidney is related to underlying scarring. No lesion is seen. The ureters are within normal limits without calculi. The bladder is well distended. Stomach/Bowel: Colon shows no obstructive or inflammatory changes. The appendix is within normal limits. Small bowel is  unremarkable. Wall thickening in the stomach is again noted stable from the prior exam. Vascular/Lymphatic: Aortic atherosclerosis. No enlarged abdominal or pelvic lymph nodes. Reproductive: Prostate is well visualized. Uro lift device is noted in place. No periprostatic inflammatory changes noted. Other: No abdominal wall hernia or abnormality. No abdominopelvic ascites. Musculoskeletal: No acute or significant osseous findings. IMPRESSION: No acute abnormality noted. Bilateral nonobstructing renal calculi. Status post uro lift procedure. Gastric wall thickening stable from the prior exam. Electronically Signed   By: Inez Catalina M.D.   On: 06/05/2020 23:38     Scheduled Meds: . aspirin EC  81 mg Oral Daily  . atorvastatin  40 mg Oral BID  . cholecalciferol  1,000 Units Oral Daily  . enoxaparin (LOVENOX) injection  40 mg Subcutaneous Q24H  . feeding supplement (GLUCERNA SHAKE)  237 mL Oral TID BM  . ferrous sulfate  325 mg Oral Once per day on Mon Wed Fri  . insulin aspart  0-15 Units Subcutaneous TID WC  . insulin aspart  4 Units Subcutaneous TID WC  . multivitamin with minerals  1 tablet Oral Daily  . ramipril  10 mg Oral QPM  . tamsulosin  0.4 mg Oral Daily   Continuous Infusions: . ceFEPime (MAXIPIME) IV 2 g (06/07/20 0500)     LOS: 3 days     Enzo Bi, MD [facility] If 7PM-7AM, please contact night-coverage 06/07/2020, 2:38 PM

## 2020-06-07 NOTE — Plan of Care (Signed)
Shift Summary: Pt mentation much improved from previous night. Orientedx3 but still with occasional word finding difficulty, though pt aware of this and frustrated by it. On RA, VSS. Adequate UO overnight, no BM this shift. IV ABX infused w/o issue. Rounding performed, fall/safety precautions in place, needs/concerns addressed. Continuing w/POC.  Problem: Education: Goal: Knowledge of General Education information will improve Description: Including pain rating scale, medication(s)/side effects and non-pharmacologic comfort measures Outcome: Progressing   Problem: Health Behavior/Discharge Planning: Goal: Ability to manage health-related needs will improve Outcome: Progressing   Problem: Clinical Measurements: Goal: Ability to maintain clinical measurements within normal limits will improve Outcome: Progressing Goal: Will remain free from infection Outcome: Progressing Goal: Diagnostic test results will improve Outcome: Progressing Goal: Respiratory complications will improve Outcome: Progressing Goal: Cardiovascular complication will be avoided Outcome: Progressing   Problem: Activity: Goal: Risk for activity intolerance will decrease Outcome: Progressing   Problem: Nutrition: Goal: Adequate nutrition will be maintained Outcome: Progressing   Problem: Coping: Goal: Level of anxiety will decrease Outcome: Progressing   Problem: Elimination: Goal: Will not experience complications related to bowel motility Outcome: Progressing Goal: Will not experience complications related to urinary retention Outcome: Progressing   Problem: Pain Managment: Goal: General experience of comfort will improve Outcome: Progressing   Problem: Safety: Goal: Ability to remain free from injury will improve Outcome: Progressing   Problem: Skin Integrity: Goal: Risk for impaired skin integrity will decrease Outcome: Progressing

## 2020-06-08 DIAGNOSIS — N390 Urinary tract infection, site not specified: Secondary | ICD-10-CM | POA: Diagnosis not present

## 2020-06-08 DIAGNOSIS — A419 Sepsis, unspecified organism: Secondary | ICD-10-CM | POA: Diagnosis not present

## 2020-06-08 LAB — CBC
HCT: 38.6 % — ABNORMAL LOW (ref 39.0–52.0)
Hemoglobin: 13.6 g/dL (ref 13.0–17.0)
MCH: 30.4 pg (ref 26.0–34.0)
MCHC: 35.2 g/dL (ref 30.0–36.0)
MCV: 86.4 fL (ref 80.0–100.0)
Platelets: 271 10*3/uL (ref 150–400)
RBC: 4.47 MIL/uL (ref 4.22–5.81)
RDW: 11.9 % (ref 11.5–15.5)
WBC: 14.9 10*3/uL — ABNORMAL HIGH (ref 4.0–10.5)
nRBC: 0 % (ref 0.0–0.2)

## 2020-06-08 LAB — BASIC METABOLIC PANEL
Anion gap: 10 (ref 5–15)
BUN: 12 mg/dL (ref 8–23)
CO2: 24 mmol/L (ref 22–32)
Calcium: 8.7 mg/dL — ABNORMAL LOW (ref 8.9–10.3)
Chloride: 103 mmol/L (ref 98–111)
Creatinine, Ser: 0.63 mg/dL (ref 0.61–1.24)
GFR, Estimated: >60 mL/min (ref >60)
Glucose, Bld: 183 mg/dL — ABNORMAL HIGH (ref 70–99)
Potassium: 3.7 mmol/L (ref 3.5–5.1)
Sodium: 137 mmol/L (ref 135–145)

## 2020-06-08 LAB — MAGNESIUM: Magnesium: 1.7 mg/dL (ref 1.7–2.4)

## 2020-06-08 LAB — GLUCOSE, CAPILLARY
Glucose-Capillary: 296 mg/dL — ABNORMAL HIGH (ref 70–99)
Glucose-Capillary: 272 mg/dL — ABNORMAL HIGH (ref 70–99)
Glucose-Capillary: 143 mg/dL — ABNORMAL HIGH (ref 70–99)
Glucose-Capillary: 240 mg/dL — ABNORMAL HIGH (ref 70–99)

## 2020-06-08 MED ORDER — PANTOPRAZOLE SODIUM 40 MG PO TBEC
40.0000 mg | DELAYED_RELEASE_TABLET | Freq: Every day | ORAL | Status: DC
  Administered 2020-06-08 – 2020-06-09 (×2): 40 mg via ORAL
  Filled 2020-06-08 (×2): qty 1

## 2020-06-08 NOTE — Plan of Care (Signed)
Shift Summary: No acute events overnight. Stomach pain/indigestion/heartburn addressed with PRN Maalox. Refused lovenox subq overnight. IV ABX infused w/o issue. Adequate UO, no BM this shift. Fall/safety precautions in place, rounding performed, needs/concerns addressed during shift. Continuing with POC.   Problem: Education: Goal: Knowledge of General Education information will improve Description: Including pain rating scale, medication(s)/side effects and non-pharmacologic comfort measures Outcome: Progressing   Problem: Health Behavior/Discharge Planning: Goal: Ability to manage health-related needs will improve Outcome: Progressing   Problem: Clinical Measurements: Goal: Ability to maintain clinical measurements within normal limits will improve Outcome: Progressing Goal: Will remain free from infection Outcome: Progressing Goal: Diagnostic test results will improve Outcome: Progressing Goal: Respiratory complications will improve Outcome: Progressing Goal: Cardiovascular complication will be avoided Outcome: Progressing   Problem: Safety: Goal: Ability to remain free from injury will improve Outcome: Progressing   Problem: Pain Managment: Goal: General experience of comfort will improve Outcome: Progressing   Problem: Elimination: Goal: Will not experience complications related to bowel motility Outcome: Progressing Goal: Will not experience complications related to urinary retention Outcome: Progressing   Problem: Nutrition: Goal: Adequate nutrition will be maintained Outcome: Progressing

## 2020-06-08 NOTE — Progress Notes (Signed)
PROGRESS NOTE    Dustin Crane  TMH:[id number] DOB: [birth year] DOA: 06/04/2020 PCP: Idelle Crouch, MD  105A/[id number]-BB   Assessment & Plan:   Principal Problem:   Sepsis secondary to UTI [facility]) Active Problems:   Benign essential hypertension   CAD (coronary artery disease)   Diabetes mellitus with hyperglycemia ([place])   Gastroesophageal reflux disease without esophagitis   Acute metabolic encephalopathy   Sepsis ([place])   Dustin Crane is a 80 y.o. male with medical history significant  forhypertension, type II diabetes mellitus, coronary artery disease status post stent angioplasty, GERD, peripheral vascular disease and status post recent UroLift procedure which was done on 05/19/20. Patient was recently discharged from the hospital after treatment for sepsis from a UTI.  He also had delirium.  His urine culture during the last hospitalization yielded Citrobacter freundii and patient was treated with IV cefepime and transitioned to oral ciprofloxacin which he is yet to complete.  Per his wife his mental status was back to baseline upon his discharge home 3 days ago. Patient was in his usual state of health until morning of presentation when his wife found him confused and lethargic in the living room.  He had gone to bed in his usual state of health and per his wife had helped her with some activities around the house the day prior. She called EMS and patient was brought to the ER for further evaluation.  On presentaiton Patient was lethargic and opened eyes to painful stimuli.  Sepsis secondary to complicated UTI As evidenced by fever with a T-max of 101.7 tachycardia, leukocytosis, pyuria and elevated lactic acid level. Prior urine culture yielded Citrobacter freundii sensitive to cephalosporins. Appeared to have failed outpatient oral abx. --started on IV vacn, cefepime and flagyl on presentation, and IVF resuscitation. --urine cx from this admission is neg growth --ID  consulted Plan: --cont cefepime to complete 14 days of total course  Acute metabolic encephalopathy, resolved --likely due to infection.  Already improved the morning after presentation.  Sundowning and delirium --noted to be more confused at night.  --orientation and re-direct --If pt becomes severely agitated, can give IV haldol PRN  Diabetes mellitus, poorly controlled --recent A1c 8.1 --cont mealtime 4u TID --ACHS and SSI TID  Coronary artery disease --cont home ASA, stain and fish oil  History of hypertension --cont home ramipril  BPH --no retention after Foley removed --cont Flomax  Hypokalemia --replete with oral potassium PRN  GERD --resume home PPI   DVT prophylaxis: Lovenox SQ Code Status: Full code  Family Communication: wife updated at bedside today Level of care: Med-Surg Dispo:   The patient is from: home Anticipated d/c is to: home  Anticipated d/c date is: Monday Patient currently is not medically ready to d/c due to: failed outpatient oral abx for UTI, now need IV abx until at least Monday   Subjective and Interval History:  Pt complained of abdominal pain overnight, thought to be due to not having received his home PPI (though not on his home med list on presentation).    Reported easy fatigue with minimum exertion.  No dyspnea.  Mild pain with urination.   Objective: Vitals:   06/07/20 2345 06/08/20 0446 06/08/20 0833 06/08/20 1105  BP: 121/90 (!) 167/88 (!) 152/77 (!) 158/89  Pulse: (!) 101 98 91 83  Resp: 16 16 16 17   Temp: 98.9 F (37.2 C) 99 F (37.2 C) 98 F (36.7 C) 98.7 F (37.1 C)  TempSrc:   Oral  Oral  SpO2: 97% 93% 95% 99%  Weight:      Height:        Intake/Output Summary (Last 24 hours) at 06/08/2020 1442 Last data filed at 06/08/2020 1413 Gross per 24 hour  Intake 1017 ml  Output --  Net 1017 ml   Filed Weights   06/04/20 1047 06/06/20 0355  Weight: 81.2 kg 77.8 kg    Examination:   Constitutional:  NAD, AAOx3 HEENT: conjunctivae and lids normal, EOMI CV: No cyanosis.   RESP: normal respiratory effort, on RA Extremities: No effusions, edema in BLE SKIN: warm, dry Neuro: II - XII grossly intact.   Psych: Normal mood and affect.  Appropriate judgement and reason   Data Reviewed: I have personally reviewed following labs and imaging studies  CBC: Recent Labs  Lab 06/04/20 1126 06/05/20 0411 06/06/20 0430 06/07/20 0427 06/08/20 0421  WBC 13.0* 13.2* 9.0 11.0* 14.9*  NEUTROABS 11.2*  --   --   --   --   HGB 14.0 11.5* 12.2* 13.5 13.6  HCT 39.9 32.2* 35.1* 37.8* 38.6*  MCV 86.6 86.3 87.1 85.7 86.4  PLT 302 242 234 255 161   Basic Metabolic Panel: Recent Labs  Lab 06/04/20 1126 06/05/20 0411 06/06/20 0430 06/07/20 0427 06/08/20 0421  NA 135 135 137 139 137  K 4.4 3.3* 3.5 3.5 3.7  CL 97* 101 102 103 103  CO2 26 25 26 25 24   GLUCOSE 307* 227* 186* 181* 183*  BUN 14 14 11 11 12   CREATININE 0.95 0.89 0.70 0.65 0.63  CALCIUM 10.0 8.6* 8.7* 8.6* 8.7*  MG  --   --  1.2* 2.0 1.7   GFR: Estimated Creatinine Clearance: 74.8 mL/min (by C-G formula based on SCr of 0.63 mg/dL). Liver Function Tests: Recent Labs  Lab 06/04/20 1126  AST 26  ALT 30  ALKPHOS 84  BILITOT 1.0  PROT 7.8  ALBUMIN 4.2   No results for input(s): LIPASE, AMYLASE in the last 168 hours. No results for input(s): AMMONIA in the last 168 hours. Coagulation Profile: Recent Labs  Lab 06/04/20 1126 06/05/20 0411  INR 1.1 1.2   Cardiac Enzymes: Recent Labs  Lab 06/05/20 0411  CKTOTAL 184   BNP (last 3 results) No results for input(s): PROBNP in the last 8760 hours. HbA1C: No results for input(s): HGBA1C in the last 72 hours. CBG: Recent Labs  Lab 06/07/20 1206 06/07/20 1555 06/07/20 2106 06/08/20 0920 06/08/20 1108  GLUCAP 254* 187* 154* 296* 272*   Lipid Profile: No results for input(s): CHOL, HDL, LDLCALC, TRIG, CHOLHDL, LDLDIRECT in the last 72 hours. Thyroid Function  Tests: No results for input(s): TSH, T4TOTAL, FREET4, T3FREE, THYROIDAB in the last 72 hours. Anemia Panel: No results for input(s): VITAMINB12, FOLATE, FERRITIN, TIBC, IRON, RETICCTPCT in the last 72 hours. Sepsis Labs: Recent Labs  Lab 06/04/20 1126 06/04/20 1438 06/04/20 1749 06/05/20 0411  PROCALCITON  --   --   --  <0.10  LATICACIDVEN 2.3* 2.1* 2.0*  --     Recent Results (from the past 240 hour(s))  Urine culture     Status: None   Collection Time: 06/04/20 11:26 AM   Specimen: In/Out Cath Urine  Result Value Ref Range Status   Specimen Description   Final    IN/OUT CATH URINE Performed at [facility], [address], [place], [place] [address]    Special Requests   Final    NONE Performed at [facility], [address] [place], [place] [address]    Culture   Final    NO GROWTH Performed at [facility], [place] [address], [place], [place] [address]    Report Status 06/05/2020 FINAL  Final  Resp Panel by RT-PCR (Flu A&B, Covid) Nasopharyngeal Swab     Status: None   Collection Time: 06/04/20 11:26 AM   Specimen: Nasopharyngeal Swab; Nasopharyngeal(NP) swabs in vial transport medium  Result Value Ref Range Status   SARS Coronavirus 2 by RT PCR NEGATIVE NEGATIVE Final    Comment: (NOTE) SARS-CoV-2 target nucleic acids are NOT DETECTED.  The SARS-CoV-2 RNA is generally detectable in upper respiratory specimens during the acute phase of infection. The lowest concentration of SARS-CoV-2 viral copies this assay can detect is 138 copies/mL. A negative result does not preclude SARS-Cov-2 infection and should not be used as the sole basis for treatment or other patient management decisions. A negative result may occur with  improper specimen collection/handling, submission of specimen other than nasopharyngeal swab, presence of viral mutation(s) within the areas targeted by this assay, and inadequate number of viral copies(<138 copies/mL). A  negative result must be combined with clinical observations, patient history, and epidemiological information. The expected result is Negative.  Fact Sheet for Patients:  EntrepreneurPulse.com.au  Fact Sheet for Healthcare Providers:  IncredibleEmployment.be  This test is no t yet approved or cleared by the [place] FDA and  has been authorized for detection and/or diagnosis of SARS-CoV-2 by FDA under an Emergency Use Authorization (EUA). This EUA will remain  in effect (meaning this test can be used) for the duration of the COVID-19 declaration under Section 564(b)(1) of the Act, 21 U.S.C.section 360bbb-3(b)(1), unless the authorization is terminated  or revoked sooner.       Influenza A by PCR NEGATIVE NEGATIVE Final   Influenza B by PCR NEGATIVE NEGATIVE Final    Comment: (NOTE) The Xpert Xpress SARS-CoV-2/FLU/RSV plus assay is intended as an aid in the diagnosis of influenza from Nasopharyngeal swab specimens and should not be used as a sole basis for treatment. Nasal washings and aspirates are unacceptable for Xpert Xpress SARS-CoV-2/FLU/RSV testing.  Fact Sheet for Patients: EntrepreneurPulse.com.au  Fact Sheet for Healthcare Providers: IncredibleEmployment.be  This test is not yet approved or cleared by the [place] FDA and has been authorized for detection and/or diagnosis of SARS-CoV-2 by FDA under an Emergency Use Authorization (EUA). This EUA will remain in effect (meaning this test can be used) for the duration of the COVID-19 declaration under Section 564(b)(1) of the Act, 21 U.S.C. section 360bbb-3(b)(1), unless the authorization is terminated or revoked.  Performed at [facility], [address] [place], [place] [address]   Blood culture (routine x 2)     Status: None (Preliminary result)   Collection Time: 06/04/20 11:26 AM   Specimen: BLOOD  Result Value Ref  Range Status   Specimen Description BLOOD LEFT ANTECUBITAL  Final   Special Requests   Final    BOTTLES DRAWN AEROBIC AND ANAEROBIC Blood Culture results may not be optimal due to an inadequate volume of blood received in culture bottles   Culture   Final    NO GROWTH 4 DAYS Performed at [facility], [address], [place],  [address]    Report Status PENDING  Incomplete  Blood culture (routine x 2)     Status: None (Preliminary result)   Collection Time: 06/04/20 12:56 PM   Specimen: BLOOD  Result Value Ref Range Status  Specimen Description   Final    BLOOD Blood Culture results may not be optimal due to an inadequate volume of blood received in culture bottles   Special Requests   Final    BOTTLES DRAWN AEROBIC AND ANAEROBIC BLOOD RIGHT HAND   Culture   Final    NO GROWTH 4 DAYS Performed at [facility], [address] [place], [place] [address]    Report Status PENDING  Incomplete      Radiology Studies: No results found.   Scheduled Meds: . aspirin EC  81 mg Oral Daily  . atorvastatin  40 mg Oral BID  . cholecalciferol  1,000 Units Oral Daily  . enoxaparin (LOVENOX) injection  40 mg Subcutaneous Q24H  . feeding supplement (GLUCERNA SHAKE)  237 mL Oral TID BM  . ferrous sulfate  325 mg Oral Once per day on Mon Wed Fri  . insulin aspart  0-15 Units Subcutaneous TID WC  . insulin aspart  4 Units Subcutaneous TID WC  . multivitamin with minerals  1 tablet Oral Daily  . pantoprazole  40 mg Oral Daily  . ramipril  10 mg Oral QPM  . tamsulosin  0.4 mg Oral Daily   Continuous Infusions: . ceFEPime (MAXIPIME) IV 2 g (06/08/20 1429)     LOS: 4 days     Enzo Bi, MD [facility] If 7PM-7AM, please contact night-coverage 06/08/2020, 2:42 PM

## 2020-06-09 ENCOUNTER — Other Ambulatory Visit: Payer: Self-pay | Admitting: Infectious Diseases

## 2020-06-09 LAB — CBC
HCT: 37.6 % — ABNORMAL LOW (ref 39.0–52.0)
Hemoglobin: 13 g/dL (ref 13.0–17.0)
MCH: 30.3 pg (ref 26.0–34.0)
MCHC: 34.6 g/dL (ref 30.0–36.0)
MCV: 87.6 fL (ref 80.0–100.0)
Platelets: 262 10*3/uL (ref 150–400)
RBC: 4.29 MIL/uL (ref 4.22–5.81)
RDW: 12.1 % (ref 11.5–15.5)
WBC: 14.3 10*3/uL — ABNORMAL HIGH (ref 4.0–10.5)
nRBC: 0 % (ref 0.0–0.2)

## 2020-06-09 LAB — CULTURE, BLOOD (ROUTINE X 2)
Culture: NO GROWTH
Culture: NO GROWTH

## 2020-06-09 LAB — BASIC METABOLIC PANEL
Anion gap: 6 (ref 5–15)
BUN: 13 mg/dL (ref 8–23)
CO2: 27 mmol/L (ref 22–32)
Calcium: 9.1 mg/dL (ref 8.9–10.3)
Chloride: 105 mmol/L (ref 98–111)
Creatinine, Ser: 0.73 mg/dL (ref 0.61–1.24)
GFR, Estimated: >60 mL/min (ref >60)
Glucose, Bld: 184 mg/dL — ABNORMAL HIGH (ref 70–99)
Potassium: 5 mmol/L (ref 3.5–5.1)
Sodium: 138 mmol/L (ref 135–145)

## 2020-06-09 LAB — GLUCOSE, CAPILLARY: Glucose-Capillary: 194 mg/dL — ABNORMAL HIGH (ref 70–99)

## 2020-06-09 LAB — MAGNESIUM: Magnesium: 1.9 mg/dL (ref 1.7–2.4)

## 2020-06-09 MED ORDER — SULFAMETHOXAZOLE-TRIMETHOPRIM 800-160 MG PO TABS: 1.0000 | ORAL_TABLET | Freq: Two times a day (BID) | ORAL | 0 refills | Status: AC

## 2020-06-09 MED ORDER — SULFAMETHOXAZOLE-TRIMETHOPRIM 800-160 MG PO TABS
1.0000 | ORAL_TABLET | Freq: Two times a day (BID) | ORAL | Status: DC
  Filled 2020-06-09 (×2): qty 1

## 2020-06-09 MED ORDER — RAMIPRIL 10 MG PO CAPS: ORAL_CAPSULE | ORAL | Status: AC

## 2020-06-09 MED ORDER — TAMSULOSIN HCL 0.4 MG PO CAPS: 0.4000 mg | ORAL_CAPSULE | Freq: Every day | ORAL | 0 refills | Status: AC

## 2020-06-09 NOTE — Plan of Care (Signed)
Shift Summary: No acute events overnight. IV ABX given w/o issue. Aox4, VSS. C/o pain to right thumb d/t pt reported torn ligament he sees o/p MD for; PRN tylenol given and hot pack applied to thumb. Rounding performed, fall/safety precautions in place, needs/concerns addressed during shift.   Problem: Education: Goal: Knowledge of General Education information will improve Description: Including pain rating scale, medication(s)/side effects and non-pharmacologic comfort measures Outcome: Progressing   Problem: Health Behavior/Discharge Planning: Goal: Ability to manage health-related needs will improve Outcome: Progressing   Problem: Clinical Measurements: Goal: Ability to maintain clinical measurements within normal limits will improve Outcome: Progressing Goal: Will remain free from infection Outcome: Progressing Goal: Diagnostic test results will improve Outcome: Progressing Goal: Respiratory complications will improve Outcome: Progressing Goal: Cardiovascular complication will be avoided Outcome: Progressing   Problem: Activity: Goal: Risk for activity intolerance will decrease Outcome: Progressing   Problem: Nutrition: Goal: Adequate nutrition will be maintained Outcome: Progressing   Problem: Coping: Goal: Level of anxiety will decrease Outcome: Progressing   Problem: Elimination: Goal: Will not experience complications related to bowel motility Outcome: Progressing Goal: Will not experience complications related to urinary retention Outcome: Progressing   Problem: Pain Managment: Goal: General experience of comfort will improve Outcome: Progressing   Problem: Safety: Goal: Ability to remain free from injury will improve Outcome: Progressing   Problem: Skin Integrity: Goal: Risk for impaired skin integrity will decrease Outcome: Progressing

## 2020-06-09 NOTE — Progress Notes (Signed)
ID Pt has been discharged this morning Discussed with Dr.Lai and urologist He will go on 2 weeks of bactrim DS IBID to be treated like prostatitis secondary to urolift clips. He has already received 2 weeks of antibiotic( cipro first and then cefepime) While on bactrim BMP to be checked weekly to monitor K and cr ACEI on hold Called wife and patient and spoke to them and explained the plan. Advised adequate hydration, monitoring BP and to contact PCP/urology. I am away for 4/5-4/24  and if there is an  urgent ID need pt need to referred to Dr.Fitzgerald

## 2020-06-09 NOTE — Discharge Summary (Signed)
Physician Discharge Summary   Dustin Crane  male DOB: May 28, 1940  QJJ:[id number]  PCP: Idelle Crouch, MD  Admit date: 06/04/2020 Discharge date: 06/09/2020  Admitted From: home Disposition:  home Home Health: Yes CODE STATUS: Full code  Discharge Instructions    Discharge instructions   Complete by: As directed    Dr. Erlene Quan and ID Dr. Delaine Lame recommended you take 2 more weeks of Bactrim for presumed prostatitis.  While on Bactrim, please hold your home ramipril.    Please follow up with your primary care doctor about 1 week after discharge to check labs, BMP and CBC.   Dr. Enzo Bi [facility] Course:  For full details, please see H&P, progress notes, consult notes and ancillary notes.  Briefly,  LIBERO PUTHOFF a 80 y.o.malewith medical history significantforhypertension,type IIdiabetes mellitus, coronary artery disease status post stent angioplasty, GERD, peripheral vascular disease and recent UroLift procedure which was done on 05/19/20 who presented with AMS.  Patient was recently discharged from the hospital after treatment for sepsis from a UTI.He also had delirium. His urine culture during the last hospitalization yielded Citrobacter freundii and patient was treated with IV cefepime and transitioned to oral ciprofloxacin which he is yet to complete. Per his wife his mental status was back to baseline upon his discharge home 3 days ago.  Patient was in his usual state of health until morning of presentation when his wife found him confused and lethargic in the living room.On presentaiton Patient was lethargic and opened eyes to painful stimuli.  Sepsis secondary to complicated UTI As evidenced byfever with a T-max of 101.7tachycardia, leukocytosis, pyuria and elevated lactic acid level. Prior urine culture yielded Citrobacter freundii sensitive to cephalosporins. Appeared to have failed outpatient oral abx. --started on IV vacn,  cefepime and flagyl on presentation, and IVF resuscitation. --urine cx from this admission is neg growth.  CT a/p neg for acute finding, and prostate and UroLift appeared normal. --ID consulted --Pt was kept inpatient to continue cefepime to complete 14 days abx.  Pt's urologist Dr. Erlene Quan was contacted who recommended discharging on 2 more weeks of Bactrim to treat presumed prostatitis.   Pt will follow up with outpatient urology within a week after discharge.  Acute metabolic encephalopathy, resolved --likely due to infection.  Already improved the morning after presentation.  Pt had neuro consult and complete neuro workup during recent last hospitalization which ruled out seizure and stroke.  CT head on current presentation also neg for acute finding.  Sundowning and delirium --noted to be more confused at night.  --orientation and re-direct  Diabetes mellitus, poorly controlled --recent A1c 8.1. --received mealtime 4u TID and SSI while inpatient.  Pt was discharged back on home diabetic regimen.  Coronary artery disease --cont home ASA, stain and fish oil  History of hypertension --cont home ramipril while inpatient.  Home Ramipril held at discharge due to pt being on Bactrim to avoid hyperkalemia.  Can resume home Ramipril after pt finishes his course of Bactrim.  BPH --no retention after Foley removed --cont Flomax  Hypokalemia --replete with oral potassium PRN  GERD --continue home PPI   Discharge Diagnoses:  Principal Problem:   Sepsis secondary to UTI [facility]) Active Problems:   Benign essential hypertension   CAD (coronary artery disease)   Diabetes mellitus with hyperglycemia (HCC)   Gastroesophageal reflux disease without esophagitis   Acute metabolic encephalopathy   Sepsis ([place])   30 Day Unplanned  Readmission Risk Score   Flowsheet Row ED to Hosp-Admission (Current) from 06/04/2020 in [place] (1C)  30 Day Unplanned  Readmission Risk Score (%) 15.43 Filed at 06/09/2020 0801     This score is the patient's risk of an unplanned readmission within 30 days of being discharged (0 -100%). The score is based on dignosis, age, lab data, medications, orders, and past utilization.   Low:  0-14.9   Medium: 15-21.9   High: 22-29.9   Extreme: 30 and above        Discharge Instructions:  Allergies as of 06/09/2020      Reactions   Haemophilus Influenzae Anaphylaxis, Shortness Of Breath, Rash   Rash-severe n/v/d Ended up in hospital after taking!   Hemophilus B Polysaccharide Vaccine Anaphylaxis, Shortness Of Breath, Rash   Rash-severe n/v/d   Influenza Vac Split [influenza Virus Vaccine] Shortness Of Breath, Other (See Comments)   Rash-severe n/v/d   Hydroxyzine    Patient denies?????   Compazine [prochlorperazine Edisylate] Other (See Comments)   Muscle tightness      Medication List    STOP taking these medications   ciprofloxacin 500 MG tablet Commonly known as: Cipro   oxyCODONE-acetaminophen 7.5-325 MG tablet Commonly known as: PERCOCET   silodosin 8 MG Caps capsule Commonly known as: RAPAFLO Replaced by: tamsulosin 0.4 MG Caps capsule     TAKE these medications   aspirin 81 MG tablet Take 81 mg by mouth daily.   atorvastatin 40 MG tablet Commonly known as: LIPITOR Take 40 mg by mouth in the morning and at bedtime.   cholecalciferol 25 MCG (1000 UNIT) tablet Commonly known as: VITAMIN D3 Take 1,000 Units by mouth daily.   ferrous sulfate 325 (65 FE) MG tablet Take 325 mg by mouth 3 (three) times a week.   Fish Oil 1200 MG Caps Take 1,200 mg by mouth daily.   glimepiride 4 MG tablet Commonly known as: AMARYL Take 4 mg by mouth 2 (two) times daily.   metFORMIN 500 MG tablet Commonly known as: GLUCOPHAGE Take 500 mg by mouth 2 (two) times daily with a meal.   multivitamin with minerals tablet Take 0.5 tablets by mouth 2 (two) times daily.   omeprazole 20 MG capsule Commonly  known as: PRILOSEC Take 20 mg by mouth 2 (two) times daily.   oxybutynin 5 MG tablet Commonly known as: DITROPAN Take 1 tablet (5 mg total) by mouth every 8 (eight) hours as needed for bladder spasms.   pioglitazone 30 MG tablet Commonly known as: ACTOS Take 30 mg by mouth daily.   ramipril 10 MG capsule Commonly known as: ALTACE Hold while you are taking Bactrim.  Can resume afterwards. What changed:   how much to take  how to take this  when to take this  additional instructions   sitaGLIPtin 100 MG tablet Commonly known as: JANUVIA Take 100 mg by mouth daily.   sulfamethoxazole-trimethoprim 800-160 MG tablet Commonly known as: BACTRIM DS Take 1 tablet by mouth every 12 (twelve) hours for 14 days. Antibiotic.   tamsulosin 0.4 MG Caps capsule Commonly known as: FLOMAX Take 1 capsule (0.4 mg total) by mouth daily. Start taking on: June 10, 2020 Replaces: silodosin 8 MG Caps capsule        Follow-up Information    Idelle Crouch, MD. Call in 1 week.   Specialty: Internal Medicine Why: office will call for appointment. Contact information: [address] [place] [address] [phone]  Hollice Espy, MD. Schedule an appointment as soon as possible for a visit in 1 week.   Specialty: Urology Why: CBC and BMP Contact information: [place] [address] [phone]               Allergies  Allergen Reactions  . Haemophilus Influenzae Anaphylaxis, Shortness Of Breath and Rash    Rash-severe n/v/d Ended up in hospital after taking!   . Hemophilus B Polysaccharide Vaccine Anaphylaxis, Shortness Of Breath and Rash    Rash-severe n/v/d   . Influenza Vac Split [Influenza Virus Vaccine] Shortness Of Breath and Other (See Comments)    Rash-severe n/v/d  . Hydroxyzine     Patient denies?????  . Compazine [Prochlorperazine Edisylate] Other (See Comments)    Muscle tightness      The results of significant diagnostics from this hospitalization (including imaging, microbiology, ancillary and laboratory) are listed below for reference.   Consultations:   Procedures/Studies: CT HEAD WO CONTRAST  Result Date: 06/04/2020 CLINICAL DATA:  Mental status change EXAM: CT HEAD WITHOUT CONTRAST TECHNIQUE: Contiguous axial images were obtained from the base of the skull through the vertex without intravenous contrast. COMPARISON:  None. FINDINGS: Brain: No evidence of acute territorial infarction, hemorrhage, hydrocephalus,extra-axial collection or mass lesion/mass effect. There is dilatation the ventricles and sulci consistent with age-related atrophy. Low-attenuation changes in the deep white matter consistent with small vessel ischemia. Again noted is a lacunar infarct within the right corona radiata. Vascular: No hyperdense vessel or unexpected calcification. Skull: The skull is intact. No fracture or focal lesion identified. Sinuses/Orbits: There is opacification of the right frontal sinus with bilateral maxillary sinus mucosal thickening. The orbits and globes intact. Other: None IMPRESSION: No acute intracranial abnormality. 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 Stable sinus disease Electronically Signed   By: Prudencio Pair M.D.   On: 06/04/2020 15:56   CT Head Wo Contrast  Result Date: 05/27/2020 CLINICAL DATA:  Delirium, seizure-like activity EXAM: CT HEAD WITHOUT CONTRAST TECHNIQUE: Contiguous axial images were obtained from the base of the skull through the vertex without intravenous contrast. COMPARISON:  MRI 05/13/2020 FINDINGS: Brain: Normal anatomic configuration. Parenchymal volume loss is commensurate with the patient's age. 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 No abnormal intra or extra-axial mass lesion or fluid collection. No abnormal mass  effect or midline shift. No evidence of acute intracranial hemorrhage or infarct. Ventricular size is normal. Cerebellum unremarkable. Vascular: No asymmetric hyperdense vasculature at the skull base. Skull: Intact Sinuses/Orbits: There is dense opacification of the right frontal sinus with central hyperdensity suggesting chronic or fungal sinusitis again identified. Remaining paranasal sinuses are clear. Orbits are unremarkable. Other: Mastoid air cells and middle ear cavities are clear. IMPRESSION: No acute intracranial abnormality. Chronic right frontal sinus disease. Electronically Signed   By: Fidela Salisbury MD   On: 05/27/2020 14:09   MR BRAIN WO CONTRAST  Result Date: 05/13/2020 CLINICAL DATA:  Postural dizziness, ataxia. EXAM: MRI HEAD WITHOUT CONTRAST TECHNIQUE: Multiplanar, multiecho pulse sequences of the brain and surrounding structures were obtained without intravenous contrast. COMPARISON:  10/24/2018. FINDINGS: Brain: No diffusion-weighted signal abnormality. No intracranial hemorrhage. No midline shift, ventriculomegaly or extra-axial fluid collection. No mass lesion. Chronic insults involving the right centrum semiovale and left parietal cortex. Vascular: Normal flow voids. Skull and upper cervical spine: Normal marrow signal. Sinuses/Orbits: Normal orbits. Mild frontal, ethmoid and maxillary sinus mucosal  thickening. No mastoid effusion. Other: None. IMPRESSION: No acute intracranial process. Chronic right centrum semiovale and left parietal insults. Mild sinus disease. Electronically Signed   By: Primitivo Gauze M.D.   On: 05/13/2020 10:12   MR BRAIN W WO CONTRAST  Result Date: 05/27/2020 CLINICAL DATA:  Syncope EXAM: MRI HEAD WITHOUT AND WITH CONTRAST TECHNIQUE: Multiplanar, multiecho pulse sequences of the brain and surrounding structures were obtained without and with intravenous contrast. CONTRAST:  64mL GADAVIST GADOBUTROL 1 MMOL/ML IV SOLN COMPARISON:  None. FINDINGS: Brain: No  acute infarct, mass effect or extra-axial collection. No acute or chronic hemorrhage. There is multifocal hyperintense T2-weighted signal within the white matter. Parenchymal volume and CSF spaces are normal. 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 The midline structures are normal. There is no abnormal contrast enhancement. Vascular: Major flow voids are preserved. Skull and upper cervical spine: Normal calvarium and skull base. Visualized upper cervical spine and soft tissues are normal. Sinuses/Orbits:Partial right frontal sinus opacification. Normal orbits. IMPRESSION: 1. No acute intracranial abnormality. 2. 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 Electronically Signed   By: Ulyses Jarred M.D.   On: 05/27/2020 23:54   CT ABDOMEN PELVIS W CONTRAST  Result Date: 06/05/2020 CLINICAL DATA:  Recent uro lift procedure with possible sepsis, initial encounter EXAM: CT ABDOMEN AND PELVIS WITH CONTRAST TECHNIQUE: Multidetector CT imaging of the abdomen and pelvis was performed using the standard protocol following bolus administration of intravenous contrast. CONTRAST:  131mL OMNIPAQUE IOHEXOL 300 MG/ML  SOLN COMPARISON:  05/27/2020 FINDINGS: Lower chest: No acute abnormality. Hepatobiliary: Fatty infiltration of the liver is noted. The gallbladder is decompressed. Pancreas: Unremarkable. No pancreatic ductal dilatation or surrounding inflammatory changes. Spleen: Normal in size without focal abnormality. Adrenals/Urinary Tract: Adrenal glands are within normal limits. Kidneys are well visualized bilaterally within normal enhancement pattern and normal excretion. Small nonobstructing renal calculi are noted bilaterally. The area of abnormality in the left kidney is related to underlying scarring. No lesion is seen. The ureters are within normal limits without calculi. The bladder is well  distended. Stomach/Bowel: Colon shows no obstructive or inflammatory changes. The appendix is within normal limits. Small bowel is unremarkable. Wall thickening in the stomach is again noted stable from the prior exam. Vascular/Lymphatic: Aortic atherosclerosis. No enlarged abdominal or pelvic lymph nodes. Reproductive: Prostate is well visualized. Uro lift device is noted in place. No periprostatic inflammatory changes noted. Other: No abdominal wall hernia or abnormality. No abdominopelvic ascites. Musculoskeletal: No acute or significant osseous findings. IMPRESSION: No acute abnormality noted. Bilateral nonobstructing renal calculi. Status post uro lift procedure. Gastric wall thickening stable from the prior exam. Electronically Signed   By: Inez Catalina M.D.   On: 06/05/2020 23:38   US Carotid Bilateral  Result Date: 05/13/2020 CLINICAL DATA:  Dizziness, hypertension, hyperlipidemia and diabetes EXAM: BILATERAL CAROTID DUPLEX ULTRASOUND TECHNIQUE: Pearline Cables scale imaging, color Doppler and duplex ultrasound were performed of bilateral carotid and vertebral arteries in the neck. COMPARISON:  None. FINDINGS: Criteria: Quantification of carotid stenosis is based on velocity parameters that correlate the residual internal carotid diameter with NASCET-based stenosis levels, using the diameter of the distal internal carotid lumen as the denominator for stenosis measurement. The following velocity measurements were obtained: RIGHT ICA: 118/31 cm/sec CCA: 61/95 cm/sec SYSTOLIC ICA/CCA RATIO:  1.6 ECA: 100 cm/sec LEFT ICA: 131/34 cm/sec CCA: 093/26 cm/sec SYSTOLIC ICA/CCA RATIO:  1.1 ECA: 184 cm/sec  RIGHT CAROTID ARTERY: Mildly irregular echogenic shadowing plaque formation. No hemodynamically significant right ICA stenosis, velocity elevation, or turbulent flow. Degree of narrowing less than 50%. RIGHT VERTEBRAL ARTERY:  Normal antegrade flow LEFT CAROTID ARTERY: Similar mild echogenic plaque formation. No  hemodynamically significant left ICA stenosis, velocity elevation, or turbulent flow. LEFT VERTEBRAL ARTERY:  Normal antegrade flow IMPRESSION: Mild bilateral carotid atherosclerosis. No hemodynamically significant ICA stenosis. Degree of narrowing less than 50% bilaterally by ultrasound criteria. Patent antegrade vertebral flow bilaterally Electronically Signed   By: Jerilynn Mages.  Shick M.D.   On: 05/13/2020 12:05   DG Chest Port 1 View  Result Date: 06/04/2020 CLINICAL DATA:  Altered mental status.  Concern for sepsis EXAM: PORTABLE CHEST 1 VIEW COMPARISON:  May 27, 2020 FINDINGS: Lungs are clear. Heart upper normal in size with pulmonary vascularity normal. There is aortic atherosclerosis. Postoperative change noted in right shoulder. No evident adenopathy. IMPRESSION: Lungs clear. Heart upper normal in size. Aortic Atherosclerosis (ICD10-I70.0). Electronically Signed   By: Lowella Grip III M.D.   On: 06/04/2020 11:45   DG Chest Portable 1 View  Result Date: 05/27/2020 CLINICAL DATA:  Altered mental status.  Seizures. EXAM: PORTABLE CHEST 1 VIEW COMPARISON:  11/09/2006 FINDINGS: Poor inspiration. Extensive overlying artifact. Heart size upper limits of normal. Aortic atherosclerotic calcification is present. Increased markings at the lung bases may simply relate to the poor inspiration. Cannot left a degree of atelectasis or infiltrate. Upper lungs appear clear. No evidence of edema or effusions. No acute bone finding. Chronic postsurgical and degenerative changes of the right shoulder region. IMPRESSION: Increased markings at the lung bases may simply relate to the poor inspiration. Basilar atelectasis or infiltrate not excluded. Electronically Signed   By: Nelson Chimes M.D.   On: 05/27/2020 14:38   EEG adult  Result Date: 05/28/2020 Philemon Kingdom, MD     05/28/2020 11:41 AM Date of Study: 05/27/20 Reason for Study: Pt is a 36 YOM with PMH of HTN, HLD, DM II, CAD, GERD, PVD who presented  unresponsive and had jerking movements involving his upper and lower extremities suggestive of a possible seizure activity. Eval for seizures. Description of recording: This recording was obtained using a Digital EEG system with 18 channel capacity . Standard bipolar and referential EEG montages were used following the International  10 - 20  System. This is a digitally recorded EEG with duration of approximately 25 minutes. The background consists of 7-8 hz alpha rhythm which attenuates with eye opening and closure.  There were no epileptiform discharges, focal slowing, or recorded electrographic seizures.  Hyperventilation was not performed. Photic stimulation was performed and it did not elicit any electrographic seizures. Stage II sleep did not occur in this recording.  EKG shows normal sinus rhythm throughout the study. IMPRESSION: This is a normal EEG for age. There were no clinical seizures or epileptiform discharges noted during study. CLINICAL CORRELATION: This EEG finding did not support the diagnosis of seizure. Clinical correlation is advised. A normal EEG does not rule out epilepsy, which is a clinical diagnosis. I have personally reviewed this study.   CT Renal Stone Study  Result Date: 05/27/2020 CLINICAL DATA:  Dysuria.  Penile pain. EXAM: CT ABDOMEN AND PELVIS WITHOUT CONTRAST TECHNIQUE: Multidetector CT imaging of the abdomen and pelvis was performed following the standard protocol without IV contrast. COMPARISON:  08/06/2009 abdominal ultrasound. No prior CT. FINDINGS: Lower chest: Bibasilar atelectasis. Mild degradation by arm position, motion inferiorly. Mild cardiomegaly with multivessel coronary artery atherosclerosis.  Hepatobiliary: EKG wire and lead artifact as well. Grossly normal noncontrast appearance of the liver, gallbladder. No biliary duct dilatation. Pancreas: Normal, without mass or ductal dilatation. Spleen: Normal in size, without focal abnormality. Adrenals/Urinary Tract:  Normal adrenal glands. Bilateral punctate renal collecting system calculi, most apparent on coronal reformats. No hydronephrosis. Possible lower pole left renal 2.1 cm lesion on 45/2. No hydroureter or ureteric calculi. No bladder calculi. Bladder wall thickening is mild. Stomach/Bowel: Apparent greater curvature gastric wall thickening at 2.7 cm on 26/2, likely at least partially artifactual in the setting of underdistention. Normal colon, appendix, and terminal ileum. Normal small bowel. Vascular/Lymphatic: Aortic atherosclerosis. No abdominopelvic adenopathy. Reproductive: Moderate prostatomegaly.  Urolift seeds identified. Other: No significant free fluid. Musculoskeletal: Pelvic sclerotic lesions including at up to 11 mm in the left iliac are most likely bone islands. Degenerate disc disease at the lumbosacral junction is advanced. IMPRESSION: 1. Multifactorial degradation as detailed above. 2. Bilateral nephrolithiasis, without obstructive uropathy. 3. Mild bladder wall thickening which could be related to prostatomegaly/outlet obstruction or cystitis. 4. Status post urolift procedure. 5. Gastric underdistention.  Cannot exclude concurrent gastritis. 6. Coronary artery atherosclerosis. Aortic Atherosclerosis (ICD10-I70.0). 7. Equivocal lesion within the inter/lower pole left kidney. Consider nonemergent renal ultrasound versus more complete characterization with pre and post-contrast renal mass protocol CT. MRI likely not optimal secondary to patient age and comorbidities. Electronically Signed   By: Abigail Miyamoto M.D.   On: 05/27/2020 15:28      Labs: BNP (last 3 results) No results for input(s): BNP in the last 8760 hours. Basic Metabolic Panel: Recent Labs  Lab 06/05/20 0411 06/06/20 0430 06/07/20 0427 06/08/20 0421 06/09/20 0450  NA 135 137 139 137 138  K 3.3* 3.5 3.5 3.7 5.0  CL 101 102 103 103 105  CO2 25 26 25 24 27   GLUCOSE 227* 186* 181* 183* 184*  BUN 14 11 11 12 13   CREATININE  0.89 0.70 0.65 0.63 0.73  CALCIUM 8.6* 8.7* 8.6* 8.7* 9.1  MG  --  1.2* 2.0 1.7 1.9   Liver Function Tests: Recent Labs  Lab 06/04/20 1126  AST 26  ALT 30  ALKPHOS 84  BILITOT 1.0  PROT 7.8  ALBUMIN 4.2   No results for input(s): LIPASE, AMYLASE in the last 168 hours. No results for input(s): AMMONIA in the last 168 hours. CBC: Recent Labs  Lab 06/04/20 1126 06/05/20 0411 06/06/20 0430 06/07/20 0427 06/08/20 0421 06/09/20 0450  WBC 13.0* 13.2* 9.0 11.0* 14.9* 14.3*  NEUTROABS 11.2*  --   --   --   --   --   HGB 14.0 11.5* 12.2* 13.5 13.6 13.0  HCT 39.9 32.2* 35.1* 37.8* 38.6* 37.6*  MCV 86.6 86.3 87.1 85.7 86.4 87.6  PLT 302 242 234 255 271 262   Cardiac Enzymes: Recent Labs  Lab 06/05/20 0411  CKTOTAL 184   BNP: Invalid input(s): POCBNP CBG: Recent Labs  Lab 06/08/20 0920 06/08/20 1108 06/08/20 1548 06/08/20 2019 06/09/20 0738  GLUCAP 296* 272* 143* 240* 194*   D-Dimer No results for input(s): DDIMER in the last 72 hours. Hgb A1c No results for input(s): HGBA1C in the last 72 hours. Lipid Profile No results for input(s): CHOL, HDL, LDLCALC, TRIG, CHOLHDL, LDLDIRECT in the last 72 hours. Thyroid function studies No results for input(s): TSH, T4TOTAL, T3FREE, THYROIDAB in the last 72 hours.  Invalid input(s): FREET3 Anemia work up No results for input(s): VITAMINB12, FOLATE, FERRITIN, TIBC, IRON, RETICCTPCT in the last 72 hours.  Urinalysis    Component Value Date/Time   COLORURINE AMBER (A) 06/04/2020 1126   APPEARANCEUR CLEAR (A) 06/04/2020 1126   APPEARANCEUR Hazy (A) 05/09/2020 1056   LABSPEC 1.017 06/04/2020 1126   PHURINE 6.0 06/04/2020 1126   GLUCOSEU 150 (A) 06/04/2020 1126   HGBUR NEGATIVE 06/04/2020 1126   [place] 06/04/2020 1126   BILIRUBINUR Negative 05/09/2020 1056   KETONESUR 5 (A) 06/04/2020 1126   PROTEINUR 100 (A) 06/04/2020 1126   NITRITE POSITIVE (A) 06/04/2020 1126   LEUKOCYTESUR NEGATIVE 06/04/2020 1126    Sepsis Labs Invalid input(s): PROCALCITONIN,  WBC,  LACTICIDVEN Microbiology Recent Results (from the past 240 hour(s))  Urine culture     Status: None   Collection Time: 06/04/20 11:26 AM   Specimen: In/Out Cath Urine  Result Value Ref Range Status   Specimen Description   Final    IN/OUT CATH URINE Performed at [facility], [address], [place], [place] [address]    Special Requests   Final    NONE Performed at [facility], [address], [place], [place] [address]    Culture   Final    NO GROWTH Performed at [facility], [place] [address] [place], [place] [address]    Report Status 06/05/2020 FINAL  Final  Resp Panel by RT-PCR (Flu A&B, Covid) Nasopharyngeal Swab     Status: None   Collection Time: 06/04/20 11:26 AM   Specimen: Nasopharyngeal Swab; Nasopharyngeal(NP) swabs in vial transport medium  Result Value Ref Range Status   SARS Coronavirus 2 by RT PCR NEGATIVE NEGATIVE Final    Comment: (NOTE) SARS-CoV-2 target nucleic acids are NOT DETECTED.  The SARS-CoV-2 RNA is generally detectable in upper respiratory specimens during the acute phase of infection. The lowest concentration of SARS-CoV-2 viral copies this assay can detect is 138 copies/mL. A negative result does not preclude SARS-Cov-2 infection and should not be used as the sole basis for treatment or other patient management decisions. A negative result may occur with  improper specimen collection/handling, submission of specimen other than nasopharyngeal swab, presence of viral mutation(s) within the areas targeted by this assay, and inadequate number of viral copies(<138 copies/mL). A negative result must be combined with clinical observations, patient history, and epidemiological information. The expected result is Negative.  Fact Sheet for Patients:  EntrepreneurPulse.com.au  Fact Sheet for Healthcare Providers:   IncredibleEmployment.be  This test is no t yet approved or cleared by the [place] FDA and  has been authorized for detection and/or diagnosis of SARS-CoV-2 by FDA under an Emergency Use Authorization (EUA). This EUA will remain  in effect (meaning this test can be used) for the duration of the COVID-19 declaration under Section 564(b)(1) of the Act, 21 U.S.C.section 360bbb-3(b)(1), unless the authorization is terminated  or revoked sooner.       Influenza A by PCR NEGATIVE NEGATIVE Final   Influenza B by PCR NEGATIVE NEGATIVE Final    Comment: (NOTE) The Xpert Xpress SARS-CoV-2/FLU/RSV plus assay is intended as an aid in the diagnosis of influenza from Nasopharyngeal swab specimens and should not be used as a sole basis for treatment. Nasal washings and aspirates are unacceptable for Xpert Xpress SARS-CoV-2/FLU/RSV testing.  Fact Sheet for Patients: EntrepreneurPulse.com.au  Fact Sheet for Healthcare Providers: IncredibleEmployment.be  This test is not yet approved or cleared by the [place] FDA and has been authorized for detection and/or diagnosis of SARS-CoV-2 by FDA under an Emergency Use Authorization (EUA). This EUA will  remain in effect (meaning this test can be used) for the duration of the COVID-19 declaration under Section 564(b)(1) of the Act, 21 U.S.C. section 360bbb-3(b)(1), unless the authorization is terminated or revoked.  Performed at [facility], [address] [place], [place] [address]   Blood culture (routine x 2)     Status: None   Collection Time: 06/04/20 11:26 AM   Specimen: BLOOD  Result Value Ref Range Status   Specimen Description BLOOD LEFT ANTECUBITAL  Final   Special Requests   Final    BOTTLES DRAWN AEROBIC AND ANAEROBIC Blood Culture results may not be optimal due to an inadequate volume of blood received in culture bottles   Culture   Final    NO GROWTH 5  DAYS Performed at [facility], [address], [place], [place] [address]    Report Status 06/09/2020 FINAL  Final  Blood culture (routine x 2)     Status: None   Collection Time: 06/04/20 12:56 PM   Specimen: BLOOD  Result Value Ref Range Status   Specimen Description   Final    BLOOD Blood Culture results may not be optimal due to an inadequate volume of blood received in culture bottles   Special Requests   Final    BOTTLES DRAWN AEROBIC AND ANAEROBIC BLOOD RIGHT HAND   Culture   Final    NO GROWTH 5 DAYS Performed at [facility], [address], [place], [place] [address]    Report Status 06/09/2020 FINAL  Final     Total time spend on discharging this patient, including the last patient exam, discussing the hospital stay, instructions for ongoing care as it relates to all pertinent caregivers, as well as preparing the medical discharge records, prescriptions, and/or referrals as applicable, is 50 minutes.    Enzo Bi, MD  [facility] 06/09/2020, 10:44 AM

## 2020-06-09 NOTE — Care Management Important Message (Signed)
Important Message  Patient Details  Name: Dustin Crane MRN: [id number] Date of Birth: 1940/08/28   Medicare Important Message Given:  Yes     Juliann Pulse A Rosebud Koenen 06/09/2020, 10:03 AM

## 2020-06-09 NOTE — TOC Transition Note (Signed)
Transition of Care [facility]) - CM/SW Discharge Note   Patient Details  Name: Dustin Crane MRN: [id number] Date of Birth: [birth year]  [facility] CM/SW Contact:  Shelbie Hutching, RN Phone Number: 06/09/2020, 9:53 AM   Clinical Narrative:    Patient is medically cleared for discharge home with home health services.  Patient is open with [facility].  Joelene Millin with [facility] is aware of discharge today and MD will place [facility] orders.  Wife will transport patient home.    Final next level of care: Home w Home Health Services Barriers to Discharge: Barriers Resolved   Patient Goals and CMS Choice Patient states their goals for this hospitalization and ongoing recovery are:: Wife would like for patient to see speech for cognitive eval. CMS Medicare.gov Compare Post Acute Care list provided to:: Patient Choice offered to / list presented to : Patient  Discharge Placement                       Discharge Plan and Services   Discharge Planning Services: CM Consult Post Acute Care Choice: Home Health          DME Arranged: N/A DME Agency: NA       HH Arranged: PT,OT [place] Agency: [facility] Date [place]: 06/09/20 Time [place]: [phone] Representative spoke with at [place]: [place] ([place]) Interventions     Readmission Risk Interventions No flowsheet data found.

## 2020-06-17 ENCOUNTER — Ambulatory Visit: Payer: Medicare Other | Admitting: Urology

## 2020-06-26 ENCOUNTER — Other Ambulatory Visit: Payer: Self-pay

## 2020-06-26 ENCOUNTER — Ambulatory Visit (INDEPENDENT_AMBULATORY_CARE_PROVIDER_SITE_OTHER): Payer: Medicare Other | Admitting: Urology

## 2020-06-26 ENCOUNTER — Encounter: Payer: Self-pay | Admitting: Urology

## 2020-06-26 VITALS — BP 165/104 | HR 85

## 2020-06-26 DIAGNOSIS — N138 Other obstructive and reflux uropathy: Secondary | ICD-10-CM

## 2020-06-26 DIAGNOSIS — N401 Enlarged prostate with lower urinary tract symptoms: Secondary | ICD-10-CM

## 2020-06-26 NOTE — Progress Notes (Signed)
06/26/2020 9:35 AM   Dustin Crane Oct 14, 1940 [id number]  Referring provider: Idelle Crouch, MD [facility] [place],  [place] [address]  Chief Complaint  Patient presents with  . Benign Prostatic Hypertrophy    HPI: 80 year old male who returns for follow-up.  He is accompanied by his wife today.  He underwent UroLift procedure on 5/44/9201 which was complicated by postoperative urinary retention, UTI with sepsis requiring admission x2.  Ultimately, he did pass a voiding trial.  He was started on a prolonged course of broad-spectrum antibiotics followed by a 2-week course of Cipro followed by 2 weeks of Bactrim for presumed prostatitis.  Initial urine culture grew Citrobacter Freund D.  Imaging has been negative.  He did have follow-up with his primary care on 06/17/2020 which time his urinalysis was negative, CBC normal and BMP was also normal.  Today, he reports that he is improving.  Finally all of his pain is gone.  He is voiding with improved flow.  He was to try to stop Flomax in the near future.  He does continue have some urgency frequency.  He was having to wear depends and having accidents but this is almost completely subsided.  He still wearing a depends just in case.  No dysuria or gross hematuria.  Mental status is back to baseline.   IPSS    Row Name 06/26/20 1100         International Prostate Symptom Score   How often have you had the sensation of not emptying your bladder? Not at All     How often have you had to urinate less than every two hours? Less than half the time     How often have you found you stopped and started again several times when you urinated? Less than half the time     How often have you found it difficult to postpone urination? Less than 1 in 5 times     How often have you had a weak urinary stream? Less than half the time     How often have you had to strain to start urination? Not at All     How many  times did you typically get up at night to urinate? 1 Time     Total IPSS Score 8           Quality of Life due to urinary symptoms   If you were to spend the rest of your life with your urinary condition just the way it is now how would you feel about that? Pleased            Score:  1-7 Mild 8-19 Moderate 20-35 Severe    PMH: Past Medical History:  Diagnosis Date  . Arthritis   . Cancer ([place])    skin cancer with tags removed  . Coronary artery disease   . Diabetes mellitus without complication ([place])   . GERD (gastroesophageal reflux disease)   . Hyperlipemia   . Hypertension   . Myocardial infarction [facility]) 2004   2 stents  . Peripheral vascular disease ([place])   . TIA (transient ischemic attack) 10/2018   PATIENT DENIES THIS DIAGNOSIS  . Wears glasses   . Wears hearing aid    both ears    Surgical History: Past Surgical History:  Procedure Laterality Date  . ACHILLES TENDON SURGERY Right 12/07/2018   Procedure: ACHILLES TENDON REPAIR SECONDARY;  Surgeon: Albertine Patricia, DPM;  Location: [place];  Service: Podiatry;  Laterality: Right;  LMA LOCAL DIABETIC   block  . CALCANEAL OSTEOTOMY Right 12/07/2018   Procedure: PARTIAL CALCANECTOMY RIGHT;  Surgeon: Albertine Patricia, DPM;  Location: [place];  Service: Podiatry;  Laterality: Right;  . CARDIAC CATHETERIZATION  2005   stents x2  . CATARACT EXTRACTION W/PHACO Right 04/22/2020   Procedure: CATARACT EXTRACTION PHACO AND INTRAOCULAR LENS PLACEMENT (IOC) RIGHT DIABETIC 3.65 00:41.9;  Surgeon: Birder Robson, MD;  Location: [place];  Service: Ophthalmology;  Laterality: Right;  . CATARACT EXTRACTION W/PHACO Left 05/06/2020   Procedure: CATARACT EXTRACTION PHACO AND INTRAOCULAR LENS PLACEMENT ([place]) LEFT DIABETIC MYALUGIN;  Surgeon: Birder Robson, MD;  Location: [place];  Service: Ophthalmology;  Laterality: Left;  Dexycu 9%  LOT [id number]  EXP 10/05/2020 given @ 0757 inj  OS  8.30 1:12.4  . COLONOSCOPY    . COLONOSCOPY WITH PROPOFOL N/A 04/03/2020   Procedure: COLONOSCOPY WITH PROPOFOL;  Surgeon: [name], MD;  [facility] ENDOSCOPY;  Service: Gastroenterology;  Laterality: N/A;  . CYSTOSCOPY WITH INSERTION OF UROLIFT N/A 05/19/2020   Procedure: CYSTOSCOPY WITH INSERTION OF UROLIFT;  Surgeon: Hollice Espy, MD;  Location: [facility];  Service: Urology;  Laterality: N/A;  . FINGER ARTHROPLASTY Right 12/05/2012   Procedure: RIGHT INDEX FINGER IMPLANT ARTHROPLASTY;  Surgeon: Cammie Sickle., MD;  Location: [place];  Service: Orthopedics;  Laterality: Right;  . HERNIA REPAIR  1990   rt ing   . QUADRICEPS TENDON REPAIR Left 02/11/2017   Procedure: REPAIR QUADRICEP TENDON;  Surgeon: Leim Fabry, MD;  Location: [facility];  Service: Orthopedics;  Laterality: Left;  . SHOULDER ACROMIOPLASTY  1967   [place]   . SHOULDER ARTHROSCOPY  2012   right  . TONSILLECTOMY      Home Medications:  Allergies as of 06/26/2020      Reactions   Haemophilus Influenzae Anaphylaxis, Shortness Of Breath, Rash   Rash-severe n/v/d Ended up in hospital after taking!   Hemophilus B Polysaccharide Vaccine Anaphylaxis, Shortness Of Breath, Rash   Rash-severe n/v/d   Influenza Vac Split [influenza Virus Vaccine] Shortness Of Breath, Other (See Comments)   Rash-severe n/v/d   Hydroxyzine    Patient denies?????   Compazine [prochlorperazine Edisylate] Other (See Comments)   Muscle tightness      Medication List       Accurate as of June 26, 2020 11:59 PM. If you have any questions, ask your nurse or doctor.        STOP taking these medications   oxybutynin 5 MG tablet Commonly known as: DITROPAN Stopped by: Hollice Espy, MD     TAKE these medications   aspirin 81 MG tablet Take 81 mg by mouth daily.   atorvastatin 40 MG tablet Commonly known as: LIPITOR Take 40 mg by mouth in the morning and at bedtime.   cholecalciferol 25  MCG (1000 UNIT) tablet Commonly known as: VITAMIN D3 Take 1,000 Units by mouth daily.   ferrous sulfate 325 (65 FE) MG tablet Take 325 mg by mouth 3 (three) times a week.   Fish Oil 1200 MG Caps Take 1,200 mg by mouth daily.   glimepiride 4 MG tablet Commonly known as: AMARYL Take 4 mg by mouth 2 (two) times daily.   metFORMIN 500 MG tablet Commonly known as: GLUCOPHAGE Take 500 mg by mouth 2 (two) times daily with a meal.   multivitamin with minerals tablet Take 0.5 tablets by mouth 2 (two) times daily.  omeprazole 20 MG capsule Commonly known as: PRILOSEC Take 20 mg by mouth 2 (two) times daily.   pioglitazone 30 MG tablet Commonly known as: ACTOS Take 30 mg by mouth daily.   ramipril 10 MG capsule Commonly known as: ALTACE Hold while you are taking Bactrim.  Can resume afterwards.   sitaGLIPtin 100 MG tablet Commonly known as: JANUVIA Take 100 mg by mouth daily.   tamsulosin 0.4 MG Caps capsule Commonly known as: FLOMAX Take 1 capsule (0.4 mg total) by mouth daily.       Allergies:  Allergies  Allergen Reactions  . Haemophilus Influenzae Anaphylaxis, Shortness Of Breath and Rash    Rash-severe n/v/d Ended up in hospital after taking!   . Hemophilus B Polysaccharide Vaccine Anaphylaxis, Shortness Of Breath and Rash    Rash-severe n/v/d   . Influenza Vac Split [Influenza Virus Vaccine] Shortness Of Breath and Other (See Comments)    Rash-severe n/v/d  . Hydroxyzine     Patient denies?????  . Compazine [Prochlorperazine Edisylate] Other (See Comments)    Muscle tightness    Family History: Family History  Problem Relation Age of Onset  . Coronary artery disease Mother   . Diabetes Mother   . Alzheimer's disease Mother   . Heart disease Father   . Diabetes Father     Social History:  reports that he quit smoking about 44 years ago. He has never used smokeless tobacco. He reports current alcohol use. He reports that he does not use  drugs.   Physical Exam: BP (!) 165/104   Pulse 85   Constitutional:  Alert and oriented, No acute distress. HEENT: [place] AT, moist mucus membranes.  Trachea midline, no masses. Cardiovascular: No clubbing, cyanosis, or edema. Respiratory: Normal respiratory effort, no increased work of breathing. Skin: No rashes, bruises or suspicious lesions. Neurologic: Grossly intact, no focal deficits, moving all 4 extremities. Psychiatric: Normal mood and affect.  Records from care everywhere reviewed including most recent labs by PCP  PVR today 5 cc  Assessment & Plan:    1. BPH with urinary obstruction Status post UroLift complicated by prostatitis  Antibiotics complete, now back to baseline  Improvement in obstructive urinary symptoms with some ongoing irritability, should improve with time  Okay to stop Flomax next week  We will reassess again in 3 months with IPSS/PVR  - BLADDER SCAN AMB NON-IMAGING   Hollice Espy, MD  [facility] [address] [place], [place] [address] [phone]

## 2020-06-27 LAB — BLADDER SCAN AMB NON-IMAGING: Scan Result: 5

## 2020-10-01 ENCOUNTER — Ambulatory Visit: Payer: Medicare Other | Admitting: Urology

## 2020-10-03 ENCOUNTER — Other Ambulatory Visit: Payer: Self-pay

## 2020-10-03 ENCOUNTER — Encounter: Payer: Self-pay | Admitting: Urology

## 2020-10-05 NOTE — Progress Notes (Signed)
10/07/2020 3:29 PM   Dustin Crane Aug 01, 1940 [id number]  Referring provider: Idelle Crouch, MD [facility] [place],   [id number] Chief Complaint  Patient presents with   Benign Prostatic Hypertrophy    HPI: Dustin Crane is a 80 y.o. male who presents today for a 3 month follow-up with PSA.   He has a urological history of elevated PSA, BPH with LUTS, ED, and retrograde ejaculation.   Patient underwent UroLift procedure on [id number] which was complicated by postoperative urinary retention, UTI with sepsis requiring admission x2. He passed a voiding trial and was started on prolonged course of broad-spectrum antibiotics followed by a 2-week course of Cipro followed by 2 weeks of Bactrim for presumed prostatitis.  Initial urine culture grew Citrobacter Freund D.   PSA on 09/02/2020 was 3.73.   Today, he is primarily concerned about urinary urgency and frequency.  He reports that he will have to go to the bathroom suddenly and will almost not have time to get there.   He also c/o nocturia x 3. This is worse.  He does report improvement in his weak stream and inability to empty status post UroLift.  He denies any dysuria.  He denies any further UTI symptoms.  He also reports that he stopped taking his Flomax about a month ago.  He did not think that it was helping him much and has not seen much of an improvement or worsening since stopping this medication.  He did want to revisit his experience in the hospital again today as this was fairly traumatizing to him.   IPSS     Row Name 10/07/20 1000         International Prostate Symptom Score   How often have you had the sensation of not emptying your bladder? About half the time     How often have you had to urinate less than every two hours? More than half the time     How often have you found you stopped and started again several times when you urinated? About half the time     How often have  you found it difficult to postpone urination? About half the time     How often have you had a weak urinary stream? Less than half the time     How often have you had to strain to start urination? Less than half the time     How many times did you typically get up at night to urinate? 3 Times     Total IPSS Score 20           Quality of Life due to urinary symptoms     If you were to spend the rest of your life with your urinary condition just the way it is now how would you feel about that? Mostly Disatisfied             Score:  1-7 Mild 8-19 Moderate 20-35 Severe     PMH: Past Medical History:  Diagnosis Date   Arthritis    Cancer ([place])    skin cancer with tags removed   Coronary artery disease    Diabetes mellitus without complication ([place])    GERD (gastroesophageal reflux disease)    Hyperlipemia    Hypertension    Myocardial infarction [facility]) 2004   2 stents   Peripheral vascular disease ([place])    TIA (transient ischemic attack) 10/2018   PATIENT DENIES THIS DIAGNOSIS  Wears glasses    Wears hearing aid    both ears    Surgical History: Past Surgical History:  Procedure Laterality Date   ACHILLES TENDON SURGERY Right 12/07/2018   Procedure: ACHILLES TENDON REPAIR SECONDARY;  Surgeon: Albertine Patricia, DPM;  Location: [place];  Service: Podiatry;  Laterality: Right;  LMA LOCAL DIABETIC   block   CALCANEAL OSTEOTOMY Right 12/07/2018   Procedure: PARTIAL CALCANECTOMY RIGHT;  Surgeon: Albertine Patricia, DPM;  Location: [place];  Service: Podiatry;  Laterality: Right;   CARDIAC CATHETERIZATION  2005   stents x2   CATARACT EXTRACTION W/PHACO Right 04/22/2020   Procedure: CATARACT EXTRACTION PHACO AND INTRAOCULAR LENS PLACEMENT (I[place]) RIGHT DIABETIC 3.65 00:41.9;  Surgeon: Birder Robson, MD;  Location: [place];  Service: Ophthalmology;  Laterality: Right;   CATARACT EXTRACTION W/PHACO Left 05/06/2020   Procedure: CATARACT  EXTRACTION PHACO AND INTRAOCULAR LENS PLACEMENT ([place]) LEFT DIABETIC MYALUGIN;  Surgeon: Birder Robson, MD;  Location: [place];  Service: Ophthalmology;  Laterality: Left;  Dexycu 9%  LOT [id number]  EXP 10/05/2020 given @ 0757 inj OS  8.30 1:12.4   COLONOSCOPY     COLONOSCOPY WITH PROPOFOL N/A 04/03/2020   Procedure: COLONOSCOPY WITH PROPOFOL;  Surgeon: [name], MD;  Location[facility] ENDOSCOPY;  Service: Gastroenterology;  Laterality: N/A;   CYSTOSCOPY WITH INSERTION OF UROLIFT N/A 05/19/2020   Procedure: CYSTOSCOPY WITH INSERTION OF UROLIFT;  Surgeon: Hollice Espy, MD;  Location: [facility];  Service: Urology;  Laterality: N/A;   FINGER ARTHROPLASTY Right 12/05/2012   Procedure: RIGHT INDEX FINGER IMPLANT ARTHROPLASTY;  Surgeon: Cammie Sickle., MD;  Location: [place];  Service: Orthopedics;  Laterality: Right;   HERNIA REPAIR  1990   rt ing    QUADRICEPS TENDON REPAIR Left 02/11/2017   Procedure: REPAIR QUADRICEP TENDON;  Surgeon: Leim Fabry, MD;  Location: [facility];  Service: Orthopedics;  Laterality: Left;   SHOULDER ACROMIOPLASTY  1967   [place]    SHOULDER ARTHROSCOPY  2012   right   TONSILLECTOMY      Home Medications:  Allergies as of 10/07/2020       Reactions   Haemophilus Influenzae Anaphylaxis, Shortness Of Breath, Rash   Rash-severe n/v/d Ended up in hospital after taking!   Hemophilus B Polysaccharide Vaccine Anaphylaxis, Shortness Of Breath, Rash   Rash-severe n/v/d   Influenza Vac Split [influenza Virus Vaccine] Shortness Of Breath, Other (See Comments)   Rash-severe n/v/d   Hydroxyzine    Patient denies?????   Compazine [prochlorperazine Edisylate] Other (See Comments)   Muscle tightness        Medication List        Accurate as of October 07, 2020  3:29 PM. If you have any questions, ask your nurse or doctor.          aspirin 81 MG tablet Take 81 mg by mouth daily.   atorvastatin 40 MG  tablet Commonly known as: LIPITOR Take 40 mg by mouth in the morning and at bedtime.   cholecalciferol 25 MCG (1000 UNIT) tablet Commonly known as: VITAMIN D3 Take 1,000 Units by mouth daily.   ferrous sulfate 325 (65 FE) MG tablet Take 325 mg by mouth 3 (three) times a week.   Fish Oil 1200 MG Caps Take 1,200 mg by mouth daily.   glimepiride 4 MG tablet Commonly known as: AMARYL Take 4 mg by mouth 2 (two) times daily.   metFORMIN 500 MG tablet Commonly known  as: GLUCOPHAGE Take 500 mg by mouth 2 (two) times daily with a meal.   methocarbamol 500 MG tablet Commonly known as: ROBAXIN   multivitamin with minerals tablet Take 0.5 tablets by mouth 2 (two) times daily.   omeprazole 20 MG capsule Commonly known as: PRILOSEC Take 20 mg by mouth 2 (two) times daily.   pioglitazone 30 MG tablet Commonly known as: ACTOS Take 30 mg by mouth daily.   ramipril 10 MG capsule Commonly known as: ALTACE Hold while you are taking Bactrim.  Can resume afterwards.   sitaGLIPtin 100 MG tablet Commonly known as: JANUVIA Take 100 mg by mouth daily.   tamsulosin 0.4 MG Caps capsule Commonly known as: FLOMAX Take 0.8 mg by mouth daily.        Allergies:  Allergies  Allergen Reactions   Haemophilus Influenzae Anaphylaxis, Shortness Of Breath and Rash    Rash-severe n/v/d Ended up in hospital after taking!    Hemophilus B Polysaccharide Vaccine Anaphylaxis, Shortness Of Breath and Rash    Rash-severe n/v/d    Influenza Vac Split [Influenza Virus Vaccine] Shortness Of Breath and Other (See Comments)    Rash-severe n/v/d   Hydroxyzine     Patient denies?????   Compazine [Prochlorperazine Edisylate] Other (See Comments)    Muscle tightness    Family History: Family History  Problem Relation Age of Onset   Coronary artery disease Mother    Diabetes Mother    Alzheimer's disease Mother    Heart disease Father    Diabetes Father     Social History:  reports that he quit  smoking about 44 years ago. He has never used smokeless tobacco. He reports current alcohol use. He reports that he does not use drugs.   Physical Exam: BP 140/76   Pulse 70   Ht '5\' 8"'$  (1.727 m)   Wt 177 lb (80.3 kg)   BMI 26.91 kg/m   Constitutional:  Alert and oriented, No acute distress. HEENT: [place] AT, moist mucus membranes.  Trachea midline, no masses. Cardiovascular: No clubbing, cyanosis, or edema. Respiratory: Normal respiratory effort, no increased work of breathing. Skin: No rashes, bruises or suspicious lesions. Neurologic: Grossly intact, no focal deficits, moving all 4 extremities. Psychiatric: Normal mood and affect.  Laboratory Data:  Lab Results  Component Value Date   CREATININE 0.73 06/09/2020    Lab Results  Component Value Date   HGBA1C 8.1 (H) 05/27/2020   Pertinent imaging:  Results for orders placed or performed in visit on 10/07/20  BLADDER SCAN AMB NON-IMAGING  Result Value Ref Range   Scan Result 0 ml      Assessment & Plan:    BPH with urinary frequency  -  Improvement in obstructive including stream and emptying -No off of of flomax -Worsening irritative/storage symptoms today including urgency frequency and near episodes of urge incontinence - IPSS score 20 -We discussed treating his OAB type symptoms by starting him on Gemtesa 75 mg - Recommend cysto to evaluate for ongoing obstruction or urolift clip migrations if symptoms do not improve   RTC 4 weeks for possible cysto pending   Conley Rolls as a scribe for Hollice Espy, MD.,have documented all relevant documentation on the behalf of Hollice Espy, MD,as directed by  Hollice Espy, MD while in the presence of Hollice Espy, MD.  Hollice Espy, MD   [facility] [address] [place], [place] [address] [phone]

## 2020-10-07 ENCOUNTER — Ambulatory Visit (INDEPENDENT_AMBULATORY_CARE_PROVIDER_SITE_OTHER): Payer: Medicare Other | Admitting: Urology

## 2020-10-07 ENCOUNTER — Other Ambulatory Visit: Payer: Self-pay

## 2020-10-07 VITALS — BP 140/76 | HR 70 | Ht 68.0 in | Wt 177.0 lb

## 2020-10-07 DIAGNOSIS — N401 Enlarged prostate with lower urinary tract symptoms: Secondary | ICD-10-CM

## 2020-10-07 DIAGNOSIS — N138 Other obstructive and reflux uropathy: Secondary | ICD-10-CM

## 2020-10-07 LAB — BLADDER SCAN AMB NON-IMAGING: Scan Result: 0

## 2020-10-07 NOTE — Patient Instructions (Signed)
Cystoscopy Cystoscopy is a procedure that is used to help diagnose and sometimes treat conditions that affect the lower urinary tract. The lower urinary tract includes the bladder and the urethra. The urethra is the tube that drains urine from the bladder. Cystoscopy is done using a thin, tube-shaped instrument with a light and camera at the end (cystoscope). The cystoscope may be hard or flexible, depending on the goal of the procedure. The cystoscope is inserted through the urethra, into the bladder. Cystoscopy may be recommended if you have: Urinary tract infections that keep coming back. Blood in the urine (hematuria). An inability to control when you urinate (urinary incontinence) or an overactive bladder. Unusual cells found in a urine sample. A blockage in the urethra, such as a urinary stone. Painful urination. An abnormality in the bladder found during an intravenous pyelogram (IVP) or CT scan. Cystoscopy may also be done to remove a sample of tissue to be examined under a microscope (biopsy). What are the risks? Generally, this is a safe procedure. However, problems may occur, including: Infection. Bleeding.  What happens during the procedure?  You will be given one or more of the following: A medicine to numb the area (local anesthetic). The area around the opening of your urethra will be cleaned. The cystoscope will be passed through your urethra into your bladder. Germ-free (sterile) fluid will flow through the cystoscope to fill your bladder. The fluid will stretch your bladder so that your health care provider can clearly examine your bladder walls. Your doctor will look at the urethra and bladder. The cystoscope will be removed The procedure may vary among health care providers  What can I expect after the procedure? After the procedure, it is common to have: Some soreness or pain in your abdomen and urethra. Urinary symptoms. These include: Mild pain or burning when you  urinate. Pain should stop within a few minutes after you urinate. This may last for up to 1 week. A small amount of blood in your urine for several days. Feeling like you need to urinate but producing only a small amount of urine. Follow these instructions at home: General instructions Return to your normal activities as told by your health care provider.  Do not drive for 24 hours if you were given a sedative during your procedure. Watch for any blood in your urine. If the amount of blood in your urine increases, call your health care provider. If a tissue sample was removed for testing (biopsy) during your procedure, it is up to you to get your test results. Ask your health care provider, or the department that is doing the test, when your results will be ready. Drink enough fluid to keep your urine pale yellow. Keep all follow-up visits as told by your health care provider. This is important. Contact a health care provider if you: Have pain that gets worse or does not get better with medicine, especially pain when you urinate. Have trouble urinating. Have more blood in your urine. Get help right away if you: Have blood clots in your urine. Have abdominal pain. Have a fever or chills. Are unable to urinate. Summary Cystoscopy is a procedure that is used to help diagnose and sometimes treat conditions that affect the lower urinary tract. Cystoscopy is done using a thin, tube-shaped instrument with a light and camera at the end. After the procedure, it is common to have some soreness or pain in your abdomen and urethra. Watch for any blood in your urine.   If the amount of blood in your urine increases, call your health care provider. If you were prescribed an antibiotic medicine, take it as told by your health care provider. Do not stop taking the antibiotic even if you start to feel better. This information is not intended to replace advice given to you by your health care provider. Make  sure you discuss any questions you have with your health care provider. Document Revised: 02/14/2018 Document Reviewed: 02/14/2018 Elsevier Patient Education  2020 Elsevier Inc.  

## 2020-11-10 NOTE — Progress Notes (Signed)
11/11/20 10:17 AM   Dustin Crane Feb 09, 1941 [id number]  Referring provider:  Idelle Crouch, MD [facility] [place],  [place] [address] Chief Complaint  Patient presents with   Cysto     HPI: Dustin Crane is a 80 y.o.male  with a personal history of elevated PSA, BPH with LUTS, ED, and retrograde ejaculation, who presents today for 1 month possible cystoscopy.   Patient underwent UroLift procedure on [id number] which was complicated by postoperative urinary retention, UTI with sepsis requiring admission x2. He passed a voiding trial and was started on prolonged course of broad-spectrum antibiotics followed by a 2-week course of Cipro followed by 2 weeks of Bactrim for presumed prostatitis.  Initial urine culture grew Citrobacter Freundii.  Last visit, he was complaining primarily of urgency frequency and urge incontinence.  He has been started on Gemtesa 75 mg and has had an excellent response to this.  He reports that he experiences a weak stream in the morning but throughout the day it is steady.  He reports he has gained his strength and weight back and is starting to feel better overall.   No dysuria or gross hematuria.  Urinalysis today is negative.  Recent PSA on 09/02/2020 3.73.     PMH: Past Medical History:  Diagnosis Date   Arthritis    Cancer ([place])    skin cancer with tags removed   Coronary artery disease    Diabetes mellitus without complication ([place])    GERD (gastroesophageal reflux disease)    Hyperlipemia    Hypertension    Myocardial infarction [facility]) 2004   2 stents   Peripheral vascular disease ([place])    TIA (transient ischemic attack) 10/2018   PATIENT DENIES THIS DIAGNOSIS   Wears glasses    Wears hearing aid    both ears    Surgical History: Past Surgical History:  Procedure Laterality Date   ACHILLES TENDON SURGERY Right 12/07/2018   Procedure: ACHILLES TENDON REPAIR SECONDARY;  Surgeon: Albertine Patricia,  DPM;  Location: [place];  Service: Podiatry;  Laterality: Right;  LMA LOCAL DIABETIC   block   CALCANEAL OSTEOTOMY Right 12/07/2018   Procedure: PARTIAL CALCANECTOMY RIGHT;  Surgeon: Albertine Patricia, DPM;  Location: [place];  Service: Podiatry;  Laterality: Right;   CARDIAC CATHETERIZATION  2005   stents x2   CATARACT EXTRACTION W/PHACO Right 04/22/2020   Procedure: CATARACT EXTRACTION PHACO AND INTRAOCULAR LENS PLACEMENT (IOC) RIGHT DIABETIC 3.65 00:41.9;  Surgeon: Birder Robson, MD;  Location: [place];  Service: Ophthalmology;  Laterality: Right;   CATARACT EXTRACTION W/PHACO Left 05/06/2020   Procedure: CATARACT EXTRACTION PHACO AND INTRAOCULAR LENS PLACEMENT ([place]) LEFT DIABETIC MYALUGIN;  Surgeon: Birder Robson, MD;  Location: [place];  Service: Ophthalmology;  Laterality: Left;  Dexycu 9%  LOT [id number]  EXP 10/05/2020 given @ 0757 inj OS  8.30 1:12.4   COLONOSCOPY     COLONOSCOPY WITH PROPOFOL N/A 04/03/2020   Procedure: COLONOSCOPY WITH PROPOFOL;  Surgeon: [name], MD;  [facility];  Service: Gastroenterology;  Laterality: N/A;   CYSTOSCOPY WITH INSERTION OF UROLIFT N/A 05/19/2020   Procedure: CYSTOSCOPY WITH INSERTION OF UROLIFT;  Surgeon: Hollice Espy, MD;  Location: [facility];  Service: Urology;  Laterality: N/A;   FINGER ARTHROPLASTY Right 12/05/2012   Procedure: RIGHT INDEX FINGER IMPLANT ARTHROPLASTY;  Surgeon: Cammie Sickle., MD;  Location: [place];  Service: Orthopedics;  Laterality: Right;   HERNIA REPAIR  1990   rt ing    [place] Left 02/11/2017   Procedure: REPAIR QUADRICEP TENDON;  Surgeon: Leim Fabry, MD;  Location: [facility];  Service: Orthopedics;  Laterality: Left;   SHOULDER ACROMIOPLASTY  1967   [place]    SHOULDER ARTHROSCOPY  2012   right   TONSILLECTOMY      Home Medications:  Allergies as of 11/11/2020       Reactions   Haemophilus  Influenzae Anaphylaxis, Shortness Of Breath, Rash   Rash-severe n/v/d Ended up in hospital after taking!   Hemophilus B Polysaccharide Vaccine Anaphylaxis, Shortness Of Breath, Rash   Rash-severe n/v/d   Influenza Vac Split [influenza Virus Vaccine] Shortness Of Breath, Other (See Comments)   Rash-severe n/v/d   Hydroxyzine    Patient denies?????   Compazine [prochlorperazine Edisylate] Other (See Comments)   Muscle tightness        Medication List        Accurate as of November 11, 2020 10:17 AM. If you have any questions, ask your nurse or doctor.          STOP taking these medications    tamsulosin 0.4 MG Caps capsule Commonly known as: FLOMAX Stopped by: Hollice Espy, MD       TAKE these medications    aspirin 81 MG tablet Take 81 mg by mouth daily.   atorvastatin 40 MG tablet Commonly known as: LIPITOR Take 40 mg by mouth in the morning and at bedtime.   cholecalciferol 25 MCG (1000 UNIT) tablet Commonly known as: VITAMIN D3 Take 1,000 Units by mouth daily.   ferrous sulfate 325 (65 FE) MG tablet Take 325 mg by mouth 3 (three) times a week.   Fish Oil 1200 MG Caps Take 1,200 mg by mouth daily.   Gemtesa 75 MG Tabs Generic drug: Vibegron Take by mouth.   glimepiride 4 MG tablet Commonly known as: AMARYL Take 4 mg by mouth 2 (two) times daily.   metFORMIN 500 MG tablet Commonly known as: GLUCOPHAGE Take 500 mg by mouth 2 (two) times daily with a meal.   methocarbamol 500 MG tablet Commonly known as: ROBAXIN   multivitamin with minerals tablet Take 0.5 tablets by mouth 2 (two) times daily.   omeprazole 20 MG capsule Commonly known as: PRILOSEC Take 20 mg by mouth 2 (two) times daily.   pioglitazone 30 MG tablet Commonly known as: ACTOS Take 30 mg by mouth daily.   ramipril 10 MG capsule Commonly known as: ALTACE Hold while you are taking Bactrim.  Can resume afterwards.   sitaGLIPtin 100 MG tablet Commonly known as: JANUVIA Take  100 mg by mouth daily.        Allergies:  Allergies  Allergen Reactions   Haemophilus Influenzae Anaphylaxis, Shortness Of Breath and Rash    Rash-severe n/v/d Ended up in hospital after taking!    Hemophilus B Polysaccharide Vaccine Anaphylaxis, Shortness Of Breath and Rash    Rash-severe n/v/d    Influenza Vac Split [Influenza Virus Vaccine] Shortness Of Breath and Other (See Comments)    Rash-severe n/v/d   Hydroxyzine     Patient denies?????   Compazine [Prochlorperazine Edisylate] Other (See Comments)    Muscle tightness    Family History: Family History  Problem Relation Age of Onset   Coronary artery disease Mother    Diabetes Mother    Alzheimer's disease Mother    Heart disease Father    Diabetes Father     Social  History:  reports that he quit smoking about 44 years ago. His smoking use included cigarettes. He has never used smokeless tobacco. He reports current alcohol use. He reports that he does not use drugs.   Physical Exam: BP (!) 143/81   Pulse 73   Ht '5\' 8"'$  (1.727 m)   Wt 177 lb (80.3 kg)   BMI 26.91 kg/m   Constitutional:  Alert and oriented, No acute distress. HEENT: [place] AT, moist mucus membranes.  Trachea midline, no masses. Cardiovascular: No clubbing, cyanosis, or edema. Respiratory: Normal respiratory effort, no increased work of breathing. Skin: No rashes, bruises or suspicious lesions. Neurologic: Grossly intact, no focal deficits, moving all 4 extremities. Psychiatric: Normal mood and affect.  Laboratory Data:  Lab Results  Component Value Date   CREATININE 0.73 06/09/2020    Lab Results  Component Value Date   HGBA1C 8.1 (H) 05/27/2020    Urinalysis Unremarkable today   Pertinent Imaging: Results for orders placed or performed in visit on 10/07/20  BLADDER SCAN AMB NON-IMAGING  Result Value Ref Range   Scan Result 0 ml     Assessment & Plan:    BPH with urinary frequency  - Improvement in obstructive stream and  emptying  -No longer on any BPH medications -Hesitant to pursue cystoscopy today given improvement in symptoms with treatment of his OAB   2. OAB  - Gemtesa 75 mg has helped urinary symptoms  - continue Gemtesa; would like to try to avoid anticholinergics in this patient due to his history of delirium and confusion in the past. - Hold off on cystoscopy due to improvement in symtoms    Return in about 1 year (around 11/11/2021) for 1year IPSS PVR.  Or sooner if symptoms worsen, consider cystoscopy  [facility] [address] [place], [place] [address] [phone]

## 2020-11-11 ENCOUNTER — Encounter: Payer: Self-pay | Admitting: Urology

## 2020-11-11 ENCOUNTER — Other Ambulatory Visit: Payer: Self-pay

## 2020-11-11 ENCOUNTER — Ambulatory Visit (INDEPENDENT_AMBULATORY_CARE_PROVIDER_SITE_OTHER): Payer: Medicare Other | Admitting: Urology

## 2020-11-11 VITALS — BP 143/81 | HR 73 | Ht 68.0 in | Wt 177.0 lb

## 2020-11-11 DIAGNOSIS — N138 Other obstructive and reflux uropathy: Secondary | ICD-10-CM | POA: Diagnosis not present

## 2020-11-11 DIAGNOSIS — N401 Enlarged prostate with lower urinary tract symptoms: Secondary | ICD-10-CM | POA: Diagnosis not present

## 2020-11-11 LAB — URINALYSIS, COMPLETE
Bilirubin, UA: NEGATIVE
Glucose, UA: NEGATIVE
Ketones, UA: NEGATIVE
Leukocytes,UA: NEGATIVE
Nitrite, UA: NEGATIVE
Protein,UA: NEGATIVE
RBC, UA: NEGATIVE
Specific Gravity, UA: 1.015 (ref 1.005–1.030)
Urobilinogen, Ur: 0.2 mg/dL (ref 0.2–1.0)
pH, UA: 7.5 (ref 5.0–7.5)

## 2020-11-11 LAB — MICROSCOPIC EXAMINATION: Bacteria, UA: NONE SEEN

## 2020-11-11 MED ORDER — GEMTESA 75 MG PO TABS: 75.0000 mg | ORAL_TABLET | Freq: Every day | ORAL | 11 refills | Status: DC

## 2020-11-12 MED ORDER — GEMTESA 75 MG PO TABS: 75.0000 mg | ORAL_TABLET | Freq: Every day | ORAL | 11 refills | Status: DC

## 2020-11-12 NOTE — Addendum Note (Signed)
Addended by: Verlene Mayer A on: 11/12/2020 02:46 PM   Modules accepted: Orders

## 2020-11-17 ENCOUNTER — Telehealth: Payer: Self-pay | Admitting: Family Medicine

## 2020-11-17 NOTE — Telephone Encounter (Signed)
Marin Shutter from [facility] called to give the cover my meds code to start PA for patient  KEY [id number]

## 2020-11-18 NOTE — Telephone Encounter (Signed)
PA initiated

## 2020-11-28 NOTE — Telephone Encounter (Addendum)
Received denial for PA-will begin an appeal-patient aware

## 2021-01-09 ENCOUNTER — Telehealth: Payer: Self-pay

## 2021-01-09 NOTE — Telephone Encounter (Signed)
Patient wife left a message. Prior authorization and appeal was denied for gemtesa. She would like to know if patient is far enough out from surgery if he needs to continue this medication and if so if something else can be called in

## 2021-01-12 NOTE — Telephone Encounter (Signed)
His choices are to be [place] Myrbetriq, or [place].  Please find out if any of these are on the formulary.  Hollice Espy, MD

## 2021-01-13 MED ORDER — TROSPIUM CHLORIDE 20 MG PO TABS: 20.0000 mg | ORAL_TABLET | Freq: Two times a day (BID) | ORAL | 0 refills | Status: AC

## 2021-01-13 NOTE — Telephone Encounter (Signed)
Spoke with patients wife. They will DC the [place] and try [place].medication sent to [facility]. Pt wife verbalized understanding.

## 2021-02-28 ENCOUNTER — Emergency Department: Payer: Medicare Other

## 2021-02-28 ENCOUNTER — Other Ambulatory Visit: Payer: Self-pay

## 2021-02-28 ENCOUNTER — Encounter: Payer: Self-pay | Admitting: Emergency Medicine

## 2021-02-28 ENCOUNTER — Inpatient Hospital Stay
Admission: EM | Admit: 2021-02-28 | Discharge: 2021-03-02 | DRG: 300 | Disposition: A | Discharge status: Home / Self Care | Payer: Medicare Other | Attending: Internal Medicine | Admitting: Internal Medicine

## 2021-02-28 DIAGNOSIS — I2510 Atherosclerotic heart disease of native coronary artery without angina pectoris: Secondary | ICD-10-CM | POA: Diagnosis present

## 2021-02-28 DIAGNOSIS — R4701 Aphasia: Secondary | ICD-10-CM | POA: Diagnosis not present

## 2021-02-28 DIAGNOSIS — Z79899 Other long term (current) drug therapy: Secondary | ICD-10-CM

## 2021-02-28 DIAGNOSIS — I10 Essential (primary) hypertension: Secondary | ICD-10-CM | POA: Diagnosis present

## 2021-02-28 DIAGNOSIS — Z8249 Family history of ischemic heart disease and other diseases of the circulatory system: Secondary | ICD-10-CM

## 2021-02-28 DIAGNOSIS — Z888 Allergy status to other drugs, medicaments and biological substances status: Secondary | ICD-10-CM

## 2021-02-28 DIAGNOSIS — E1151 Type 2 diabetes mellitus with diabetic peripheral angiopathy without gangrene: Secondary | ICD-10-CM | POA: Diagnosis present

## 2021-02-28 DIAGNOSIS — I252 Old myocardial infarction: Secondary | ICD-10-CM

## 2021-02-28 DIAGNOSIS — Z8673 Personal history of transient ischemic attack (TIA), and cerebral infarction without residual deficits: Secondary | ICD-10-CM

## 2021-02-28 DIAGNOSIS — R569 Unspecified convulsions: Secondary | ICD-10-CM

## 2021-02-28 DIAGNOSIS — E1165 Type 2 diabetes mellitus with hyperglycemia: Secondary | ICD-10-CM | POA: Diagnosis present

## 2021-02-28 DIAGNOSIS — K219 Gastro-esophageal reflux disease without esophagitis: Secondary | ICD-10-CM | POA: Diagnosis present

## 2021-02-28 DIAGNOSIS — Z955 Presence of coronary angioplasty implant and graft: Secondary | ICD-10-CM

## 2021-02-28 DIAGNOSIS — I6523 Occlusion and stenosis of bilateral carotid arteries: Secondary | ICD-10-CM | POA: Diagnosis present

## 2021-02-28 DIAGNOSIS — I7389 Other specified peripheral vascular diseases: Principal | ICD-10-CM | POA: Diagnosis present

## 2021-02-28 DIAGNOSIS — I259 Chronic ischemic heart disease, unspecified: Secondary | ICD-10-CM | POA: Insufficient documentation

## 2021-02-28 DIAGNOSIS — Z7984 Long term (current) use of oral hypoglycemic drugs: Secondary | ICD-10-CM

## 2021-02-28 DIAGNOSIS — N400 Enlarged prostate without lower urinary tract symptoms: Secondary | ICD-10-CM | POA: Diagnosis present

## 2021-02-28 DIAGNOSIS — I6529 Occlusion and stenosis of unspecified carotid artery: Secondary | ICD-10-CM | POA: Diagnosis present

## 2021-02-28 DIAGNOSIS — E785 Hyperlipidemia, unspecified: Secondary | ICD-10-CM | POA: Diagnosis present

## 2021-02-28 DIAGNOSIS — Z87891 Personal history of nicotine dependence: Secondary | ICD-10-CM

## 2021-02-28 DIAGNOSIS — Z7982 Long term (current) use of aspirin: Secondary | ICD-10-CM

## 2021-02-28 DIAGNOSIS — D649 Anemia, unspecified: Secondary | ICD-10-CM | POA: Diagnosis present

## 2021-02-28 DIAGNOSIS — Z20822 Contact with and (suspected) exposure to covid-19: Secondary | ICD-10-CM | POA: Diagnosis present

## 2021-02-28 DIAGNOSIS — Z85828 Personal history of other malignant neoplasm of skin: Secondary | ICD-10-CM

## 2021-02-28 DIAGNOSIS — I639 Cerebral infarction, unspecified: Secondary | ICD-10-CM | POA: Diagnosis present

## 2021-02-28 LAB — CBC
HCT: 38.9 % — ABNORMAL LOW (ref 39.0–52.0)
Hemoglobin: 13.5 g/dL (ref 13.0–17.0)
MCH: 30.6 pg (ref 26.0–34.0)
MCHC: 34.7 g/dL (ref 30.0–36.0)
MCV: 88.2 fL (ref 80.0–100.0)
Platelets: 225 10*3/uL (ref 150–400)
RBC: 4.41 MIL/uL (ref 4.22–5.81)
RDW: 11.9 % (ref 11.5–15.5)
WBC: 8.5 10*3/uL (ref 4.0–10.5)
nRBC: 0 % (ref 0.0–0.2)

## 2021-02-28 LAB — COMPREHENSIVE METABOLIC PANEL
ALT: 21 U/L (ref 0–44)
AST: 21 U/L (ref 15–41)
Albumin: 4.2 g/dL (ref 3.5–5.0)
Alkaline Phosphatase: 63 U/L (ref 38–126)
Anion gap: 12 (ref 5–15)
BUN: 13 mg/dL (ref 8–23)
CO2: 24 mmol/L (ref 22–32)
Calcium: 8.8 mg/dL — ABNORMAL LOW (ref 8.9–10.3)
Chloride: 103 mmol/L (ref 98–111)
Creatinine, Ser: 0.78 mg/dL (ref 0.61–1.24)
GFR, Estimated: >60 mL/min (ref >60)
Glucose, Bld: 131 mg/dL — ABNORMAL HIGH (ref 70–99)
Potassium: 4.1 mmol/L (ref 3.5–5.1)
Sodium: 139 mmol/L (ref 135–145)
Total Bilirubin: 0.8 mg/dL (ref 0.3–1.2)
Total Protein: 7.5 g/dL (ref 6.5–8.1)

## 2021-02-28 LAB — DIFFERENTIAL
Abs Immature Granulocytes: 0.06 10*3/uL (ref 0.00–0.07)
Basophils Absolute: 0 10*3/uL (ref 0.0–0.1)
Basophils Relative: 1 %
Eosinophils Absolute: 0.2 10*3/uL (ref 0.0–0.5)
Eosinophils Relative: 3 %
Immature Granulocytes: 1 %
Lymphocytes Relative: 32 %
Lymphs Abs: 2.7 10*3/uL (ref 0.7–4.0)
Monocytes Absolute: 0.7 10*3/uL (ref 0.1–1.0)
Monocytes Relative: 8 %
Neutro Abs: 4.7 10*3/uL (ref 1.7–7.7)
Neutrophils Relative %: 55 %

## 2021-02-28 LAB — SAMPLE TO BLOOD BANK

## 2021-02-28 LAB — PROTIME-INR
INR: 1 (ref 0.8–1.2)
Prothrombin Time: 13.2 seconds (ref 11.4–15.2)

## 2021-02-28 LAB — CBG MONITORING, ED: Glucose-Capillary: 137 mg/dL — ABNORMAL HIGH (ref 70–99)

## 2021-02-28 LAB — APTT: aPTT: 40 seconds — ABNORMAL HIGH (ref 24–36)

## 2021-02-28 MED ORDER — NICARDIPINE HCL IN NACL 20-0.86 MG/200ML-% IV SOLN: 0.0000 mg/h | INTRAVENOUS | Status: DC

## 2021-02-28 MED ORDER — LABETALOL HCL 5 MG/ML IV SOLN: 20.0000 mg | Freq: Once | INTRAVENOUS | Status: DC

## 2021-02-28 MED ORDER — PANTOPRAZOLE SODIUM 40 MG IV SOLR
40.0000 mg | Freq: Once | INTRAVENOUS | Status: AC
  Administered 2021-03-01: 40 mg via INTRAVENOUS
  Filled 2021-02-28: qty 40

## 2021-02-28 MED ORDER — TENECTEPLASE FOR STROKE
0.2500 mg/kg | PACK | Freq: Once | INTRAVENOUS | Status: AC
  Administered 2021-02-28: 23:00:00 20 mg via INTRAVENOUS

## 2021-02-28 MED ORDER — IOHEXOL 350 MG/ML SOLN
100.0000 mL | Freq: Once | INTRAVENOUS | Status: AC | PRN
  Administered 2021-02-28: 100 mL via INTRAVENOUS

## 2021-02-28 MED ORDER — SODIUM CHLORIDE 0.9% FLUSH
3.0000 mL | Freq: Once | INTRAVENOUS | Status: AC
  Administered 2021-02-28: 23:00:00 3 mL via INTRAVENOUS

## 2021-02-28 MED ORDER — TENECTEPLASE FOR STROKE: 0.2500 mg/kg | PACK | Freq: Once | INTRAVENOUS | Status: DC

## 2021-02-28 MED ORDER — ONDANSETRON HCL 4 MG/2ML IJ SOLN
4.0000 mg | Freq: Once | INTRAMUSCULAR | Status: AC
  Administered 2021-03-01: 4 mg via INTRAVENOUS
  Filled 2021-02-28: qty 2

## 2021-02-28 MED ORDER — TENECTEPLASE FOR STROKE
PACK | INTRAVENOUS | Status: AC
  Filled 2021-02-28: qty 10

## 2021-02-28 NOTE — Consult Note (Signed)
[facility] Consult [facility]  Date of Birth:   Mar 25, 1940 Date of Service:   02/28/2021 22:15:41  Diagnosis:       R47.01 - Aphasia  Impression:      Patient is a 80 yo RH male with a PMH of HTN, HLD, DM II, CAD s/p PCI, h/o MI, carotid stenosis, BPH, HLD, chronic left parietal subcortical, right corona radiata lacunar strokes who p/w acute onset expressive aphasia. LNK 20:45, per patient and his wife. On exam he has a right facial droop, with expresive aphasia. He is frustated when speaking and unable to expressive his thought. Frequent anomia, paraphasic and phonemic errors. Intermittent nonsenical speech. NIHSS of 4. mRS of 0. [place] reviewed, no acute intracranial findings noted on my review and on radiologist read. Exam c/w acute left hemispheric stroke. Thrombolytic therapy was recommended given potentially disabling expressive aphasia. Both, patient and his wife are in agreement and find his expressive aphasia disabling and note that it would significantly affect his quality of life. At this facility Tenecteplase is the only thrombolytic available for usage for ischemic stroke. The potential risks, benefits and alternative treatments were reviewed with patient and his wife in detail. They deny any contraindications to thrombolytic therapy. Tenecteplase was administered at 22:52. CTP, CTA head and neck are pending given aphasia.    Note: Post TNK at 23:30 pt developed a mild headache. He is being taken to CT currently for repeat CTH and CTP/CTA.Subsequent PTT elevated at 40- within range for thrombolytics,  (no prior h/o elevated PTT or risks factors for coagulopathy) normal INR, PT  Addendum: On prelim review of CTP, CTA no core/penumbra, no definitive intracranial LVO. No ICH is appreciated- full images not yet available. Will follow final read  Subsequent final read and full images including Noncontrast CTH. No ICH. NO LVO. Emergent NIR not indicated. B/L  proximal ICA stenosis of 50%. D/W ED MD  Metrics: Last Known Well: 02/28/2021 20:45:00 [facility] Notification Time: 02/28/2021 22:15:41 Arrival Time: 02/28/2021 21:55:00 Stamp Time: 02/28/2021 22:15:41 Initial Response Time: 02/28/2021 22:23:11 Symptoms: aphasia. NIHSS Start Assessment Time: 02/28/2021 22:40:00 Patient is a candidate for Thrombolytic. Thrombolytic Medical Decision: 02/28/2021 22:45:29 Needle Time: 02/28/2021 22:52:29 Weight Noted by Staff: 78.8 kg  CT head showed no acute hemorrhage or acute core infarct. I personally Reviewed the CT Head and it Showed no acute findings ASPECTS 10/10  ED Physician notified of diagnostic impression and management plan on 02/28/2021 23:20:00  Advanced Imaging: CTA Head and Neck Ordered:  CTP Ordered:   Thrombolytic Contraindications:  Last Known Well > 4.5 hours: No CT Head showing hemorrhage: No Ischemic stroke within 3 months: No Severe head trauma within 3 months: No Intracranial/intraspinal surgery within 3 months: No History of intracranial hemorrhage: No Symptoms and signs consistent with an SAH: No GI malignancy or GI bleed within 21 days: No Coagulopathy: Platelets <100 000 /mm3, INR >1.7, aPTT>40 s, or PT >15 s: No Treatment dose of LMWH within the previous 24 hrs: No Use of NOACs in past 48 hours: No Glycoprotein IIb/IIIa receptor inhibitors use: No Symptoms consistent with infective endocarditis: No Suspected aortic arch dissection: No Intra-axial intracranial neoplasm: No  Thrombolytic Decision and Management Plan: Management with thrombolytic treatment was explained to the Patient as was risks and benefits and alternatives to the treatment. Patient agrees with the decision to proceed with thrombolytic treatment. . All questions were answered and the Patient expressed understanding of the treatment plan.  Our recommendations are outlined below.  Recommendations: IV Tenecteplase  recommended.  Thrombolytic bolus given Without Complication.   IV Tenecteplase Total Dose - 20.0 mg   Routine post Thrombolytic monitoring including neuro checks and blood pressure control during/after treatment Monitor blood pressure Check blood pressure and neuro assessment every 15 min for 2 h, then every 30 min for 6 h, and finally every hour for 16 h.  Manage Blood Pressure per post Thrombolytic protocol.        Admission to ICU       CT brain 24 hours post Thrombolytic       NPO until swallowing screen performed and passed       No antiplatelet agents or anticoagulants (including heparin for DVT prophylaxis) in first 24 hours       No Foley catheter, nasogastric tube, arterial catheter or central venous catheter for 24 hr, unless absolutely necessary       Telemetry       Bedside swallow evaluation       HOB less than 30 degrees       Euglycemia       Avoid hyperthermia, PRN acetaminophen       DVT prophylaxis       Inpatient Neurology Consultation       Stroke evaluation as per inpatient neurology recommendations  Discussed with ED physician    ------------------------------------------------------------------------------  History of Present Illness: Patient is a 80 year old Male.  Patient was brought by private transportation with symptoms of aphasia. Patient is a 80 yo RH male with a PMH of HTN, HLD, DM II, CAD s/p PCI, h/o MI, carotid stenosis, BPH, HLD, chronic left parietal subconrtical, right coronoa radiata lacunar strokes who p/w acute onset expressive aphasia. LNK 20:45, per patient and his wife. History obtained from patient and his wife, who is a retired Therapist, sports at bedside. Patient was in his USOH. At 22:11 developed acute speech changes, with gibberish speech. He was using the wrong words and could not think of the words he wanted to say. No associated weakness, numbness. Patient denies CP and headache. Persistent, worsening expressive aphasia in the ED. No h/o  ICH. No OAC. No major surgeries in the last 3 months, left hand thumb orthopedic surgery 2 months prior. No h/o abnormal bleeding. No h/o coagulopathy or bleeding disorder. No h/o chemotherapy, ESRD or cirrhosis. No h/o IBE.     Past Medical History:      Hypertension      Diabetes Mellitus      Hyperlipidemia      Coronary Artery Disease      Stroke      There is no history of Atrial Fibrillation  Medications:  No Anticoagulant use  Antiplatelet use: Yes ASA Reviewed EMR for current medications  Allergies:  Reviewed  Social History: Patient Is: Married Smoking: Former Alcohol Use: Yes, rare/occassional Drug Use: No  Family History:  There Is Family History Of: Coronary artery disease Mother     Diabetes Mother     Alzheimer's disease Mother     Heart disease Father     Diabetes Father     There is no family history of premature cerebrovascular disease pertinent to this consultation  ROS : 14 Points Review of Systems was performed and was negative except mentioned in HPI.  Past Surgical History: There Is No Surgical History Contributory To Todays Visit    Examination: BP(149//91), Pulse(99), Blood Glucose(137) 1A: Level of Consciousness - Alert; keenly responsive + 0 1B: Ask Month and Age - 1 Question Right +  1 1C: Blink Eyes & Squeeze Hands - Performs Both Tasks + 0 2: Test Horizontal Extraocular Movements - Normal + 0 3: Test Visual Fields - No Visual Loss + 0 4: Test Facial Palsy (Use Grimace if Obtunded) - Minor paralysis (flat nasolabial fold, smile asymmetry) + 1 5A: Test Left Arm Motor Drift - No Drift for 10 Seconds + 0 5B: Test Right Arm Motor Drift - No Drift for 10 Seconds + 0 6A: Test Left Leg Motor Drift - No Drift for 5 Seconds + 0 6B: Test Right Leg Motor Drift - No Drift for 5 Seconds + 0 7: Test Limb Ataxia (FNF/Heel-Shin) - No Ataxia + 0 8: Test Sensation - Normal; No sensory loss + 0 9: Test Language/Aphasia - Severe Aphasia: Fragmentary  Expression, Inference Needed, Cannot Identify Materials + 2 10: Test Dysarthria - Normal + 0 11: Test Extinction/Inattention - No abnormality + 0  NIHSS Score: 4  Pre-Morbid Modified Rankin Scale: 0 Points = No symptoms at all   Patient/Family was informed the Neurology Consult would occur via TeleHealth consult by way of interactive audio and video telecommunications and consented to receiving care in this manner.   Patient is being evaluated for possible acute neurologic impairment and high probability of imminent or life-threatening deterioration. I spent total of 90 minutes providing care to this patient, including time for face to face visit via telemedicine, review of medical records, imaging studies and discussion of findings with providers, the patient and/or family.   Dr Ruffin Frederick   [facility] [phone]  Case [id number]

## 2021-02-28 NOTE — H&P (Incomplete)
History and Physical    CLOYD RAGAS XJO:[id number] DOB: 1940-10-20 DOA: 02/28/2021  PCP: Idelle Crouch, MD    Patient coming from:  Home    Chief Complaint:  Aphasia    HPI:  Dustin Crane is a 80 y.o. male seen in ed with complaints of difficulty speaking since 9 pm today.  Report of difficulty finding words and unable to speak clearly and get his words out.  Patient was in the kitchen when he started speaking incoherently was not able to name objects and brought to the ER.  No falls gait issues numbness paralysis weakness incontinence blurred vision double vision dizziness tremors chest pain shortness of breath palpitations fevers chills abdominal pain bowel movement changes or urinary issues.  Upon arrival to the emergency room a code stroke was called, stat head CT noncontrast showed chronic ischemic microangiopathy without acute intracranial abnormality and patient met criteria for thrombolytic therapy which was administered.    Pt has past medical history of ***     ED Course:  Vitals:   02/28/21 2200 02/28/21 2245 02/28/21 2249 02/28/21 2253  BP:  (!) 149/91  (!) 143/90  Pulse:  (!) 102  99  Resp:  (!) 24  (!) 21  Temp: 98.3 F (36.8 C)     TempSrc: Oral     SpO2:  96%  98%  Weight:   78.8 kg   Height:   5' 9"  (1.753 m)     ***  Review of Systems:  ROS   Past Medical History:  Diagnosis Date   Arthritis    Cancer ([place])    skin cancer with tags removed   Coronary artery disease    Diabetes mellitus without complication (HCC)    GERD (gastroesophageal reflux disease)    Hyperlipemia    Hypertension    Myocardial infarction [facility]) 2004   2 stents   Peripheral vascular disease ([place])    TIA (transient ischemic attack) 10/2018   PATIENT DENIES THIS DIAGNOSIS   Wears glasses    Wears hearing aid    both ears    Past Surgical History:  Procedure Laterality Date   ACHILLES TENDON SURGERY Right 12/07/2018   Procedure: ACHILLES TENDON REPAIR  SECONDARY;  Surgeon: Albertine Patricia, DPM;  Location: [place];  Service: Podiatry;  Laterality: Right;  LMA LOCAL DIABETIC   block   CALCANEAL OSTEOTOMY Right 12/07/2018   Procedure: PARTIAL CALCANECTOMY RIGHT;  Surgeon: Albertine Patricia, DPM;  Location: [place];  Service: Podiatry;  Laterality: Right;   CARDIAC CATHETERIZATION  2005   stents x2   CATARACT EXTRACTION W/PHACO Right 04/22/2020   Procedure: CATARACT EXTRACTION PHACO AND INTRAOCULAR LENS PLACEMENT (IOC) RIGHT DIABETIC 3.65 00:41.9;  Surgeon: Birder Robson, MD;  Location: [place];  Service: Ophthalmology;  Laterality: Right;   CATARACT EXTRACTION W/PHACO Left 05/06/2020   Procedure: CATARACT EXTRACTION PHACO AND INTRAOCULAR LENS PLACEMENT ([place]) LEFT DIABETIC MYALUGIN;  Surgeon: Birder Robson, MD;  Location: [place];  Service: Ophthalmology;  Laterality: Left;  Dexycu 9%  LOT [id number]  EXP 10/05/2020 given @ 0757 inj OS  8.30 1:12.4   COLONOSCOPY     COLONOSCOPY WITH PROPOFOL N/A 04/03/2020   Procedure: COLONOSCOPY WITH PROPOFOL;  Surgeon: [name], MD;  Location: [facility] ENDOSCOPY;  Service: Gastroenterology;  Laterality: N/A;   CYSTOSCOPY WITH INSERTION OF UROLIFT N/A 05/19/2020   Procedure: CYSTOSCOPY WITH INSERTION OF UROLIFT;  Surgeon: Hollice Espy, MD;  Location: [facility];  Service: Urology;  Laterality: N/A;   FINGER ARTHROPLASTY Right 12/05/2012   Procedure: RIGHT INDEX FINGER IMPLANT ARTHROPLASTY;  Surgeon: Cammie Sickle., MD;  Location: [place];  Service: Orthopedics;  Laterality: Right;   HERNIA REPAIR  1990   rt ing    QUADRICEPS TENDON REPAIR Left 02/11/2017   Procedure: REPAIR QUADRICEP TENDON;  Surgeon: Leim Fabry, MD;  Location: [facility];  Service: Orthopedics;  Laterality: Left;   SHOULDER ACROMIOPLASTY  1967   [place]    SHOULDER ARTHROSCOPY  2012   right   TONSILLECTOMY       reports that he quit smoking about 45 years  ago. His smoking use included cigarettes. He has never used smokeless tobacco. He reports current alcohol use. He reports that he does not use drugs.  Allergies  Allergen Reactions   Haemophilus Influenzae Anaphylaxis, Shortness Of Breath and Rash    Rash-severe n/v/d Ended up in hospital after taking!    Hemophilus B Polysaccharide Vaccine Anaphylaxis, Shortness Of Breath and Rash    Rash-severe n/v/d    Influenza Vac Split [Influenza Virus Vaccine] Shortness Of Breath and Other (See Comments)    Rash-severe n/v/d   Hydroxyzine     Patient denies?????   Compazine [Prochlorperazine Edisylate] Other (See Comments)    Muscle tightness    Family History  Problem Relation Age of Onset   Coronary artery disease Mother    Diabetes Mother    Alzheimer's disease Mother    Heart disease Father    Diabetes Father     Prior to Admission medications   Medication Sig Start Date End Date Taking? Authorizing Provider  aspirin 81 MG tablet Take 81 mg by mouth daily.    [provider]  atorvastatin (LIPITOR) 40 MG tablet Take 40 mg by mouth in the morning and at bedtime.    [provider]  cholecalciferol (VITAMIN D3) 25 MCG (1000 UNIT) tablet Take 1,000 Units by mouth daily.    [provider]  ferrous sulfate 325 (65 FE) MG tablet Take 325 mg by mouth 3 (three) times a week.    [provider]  glimepiride (AMARYL) 4 MG tablet Take 4 mg by mouth 2 (two) times daily. 08/05/19   [provider]  metFORMIN (GLUCOPHAGE) 500 MG tablet Take 500 mg by mouth 2 (two) times daily with a meal.    [provider]  methocarbamol (ROBAXIN) 500 MG tablet  07/28/20   [provider]  Multiple Vitamins-Minerals (MULTIVITAMIN WITH MINERALS) tablet Take 0.5 tablets by mouth 2 (two) times daily.    [provider]  Omega-3 Fatty Acids (FISH OIL) 1200 MG CAPS Take 1,200 mg by mouth daily.    [provider]  omeprazole (PRILOSEC) 20  MG capsule Take 20 mg by mouth 2 (two) times daily.    [provider]  pioglitazone (ACTOS) 30 MG tablet Take 30 mg by mouth daily. 08/03/19   [provider]  ramipril (ALTACE) 10 MG capsule Hold while you are taking Bactrim.  Can resume afterwards. 06/09/20   Enzo Bi, MD  sitaGLIPtin (JANUVIA) 100 MG tablet Take 100 mg by mouth daily.    [provider]    Physical Exam: Vitals:   02/28/21 2200 02/28/21 2245 02/28/21 2249 02/28/21 2253  BP:  (!) 149/91  (!) 143/90  Pulse:  (!) 102  99  Resp:  (!) 24  (!) 21  Temp: 98.3 F (36.8 C)     TempSrc:  Oral     SpO2:  96%  98%  Weight:   78.8 kg   Height:   5' 9"  (1.753 m)    Physical Exam   Labs on Admission: I have personally reviewed following labs and imaging studies  No results for input(s): CKTOTAL, CKMB, TROPONINI in the last 72 hours. Lab Results  Component Value Date   WBC 8.5 02/28/2021   HGB 13.5 02/28/2021   HCT 38.9 (L) 02/28/2021   MCV 88.2 02/28/2021   PLT 225 02/28/2021    Recent Labs  Lab 02/28/21 2245  NA 139  K 4.1  CL 103  CO2 24  BUN 13  CREATININE 0.78  CALCIUM 8.8*  PROT 7.5  BILITOT 0.8  ALKPHOS 63  ALT 21  AST 21  GLUCOSE 131*   No results found for: CHOL, HDL, LDLCALC, TRIG No results found for: DDIMER Invalid input(s): POCBNP   COVID-19 Labs No results for input(s): DDIMER, FERRITIN, LDH, CRP in the last 72 hours. Lab Results  Component Value Date   SARSCOV2NAA NEGATIVE 06/04/2020   [place] NEGATIVE 05/27/2020   [place] NEGATIVE 05/15/2020   [place] NEGATIVE 05/02/2020    Radiological Exams on Admission: CT HEAD CODE STROKE WO CONTRAST  Result Date: 02/28/2021 CLINICAL DATA:  Code stroke.  Aphasia EXAM: CT HEAD WITHOUT CONTRAST TECHNIQUE: Contiguous axial images were obtained from the base of the skull through the vertex without intravenous contrast. COMPARISON:  None. FINDINGS: Brain: There is no mass, hemorrhage or extra-axial collection.  The size and configuration of the ventricles and extra-axial CSF spaces are normal. There is hypoattenuation of the periventricular white matter, most commonly indicating chronic ischemic microangiopathy. Vascular: Atherosclerotic calcification of the internal carotid arteries at the skull base. No abnormal hyperdensity of the major intracranial arteries or dural venous sinuses. Skull: The visualized skull base, calvarium and extracranial soft tissues are normal. Sinuses/Orbits: No fluid levels or advanced mucosal thickening of the visualized paranasal sinuses. No mastoid or middle ear effusion. The orbits are normal. ASPECTS [facility] Stroke Program Early CT Score) - Ganglionic level infarction (caudate, lentiform nuclei, internal capsule, insula, M1-M3 cortex): 7 - Supraganglionic infarction (M4-M6 cortex): 3 Total score (0-10 with 10 being normal): 10 IMPRESSION: 1. Chronic ischemic microangiopathy without acute intracranial abnormality. 2. ASPECTS is 10. These results were called by telephone at the time of interpretation on 02/28/2021 at 10:31 pm to provider [facility] , who verbally acknowledged these results. Electronically Signed   By: Ulyses Jarred M.D.   On: 02/28/2021 22:31    EKG: Independently reviewed.  ***  echocardiogram: ***    Assessment/Plan: Active Problems:   Aphasia   Carotid stenosis   Seizure (HCC)   CAD (coronary artery disease)   Anemia, unspecified   Benign essential hypertension   Diabetes mellitus with hyperglycemia (HCC)   Gastroesophageal reflux disease without esophagitis      DVT prophylaxis:  ***   Code Status:  ***   Family Communication:  Casimiro Needle (Spouse)  [phone] (Mobile)   Disposition Plan:  ***   Consults called:  ***  Admission status: ***    Para Skeans MD [facility] [phone] How to contact the [facility] Attending or Consulting provider [place] or covering provider during after hours [place], for this patient.     Check the care team in [facility] and look for a) attending/consulting [facility] provider listed and b) the [facility] team listed Log into [email or web address] and use [place]'s universal password to access. If  you do not have the password, please contact the hospital operator. Locate the [facility] provider you are looking for under [facility] and page to a number that you can be directly reached. If you still have difficulty reaching the provider, please page the [facility] (Director on Call) for the Hospitalists listed on amion for assistance. [email or web address] Password [facility] 02/28/2021, 11:44 PM

## 2021-02-28 NOTE — ED Triage Notes (Signed)
Pt to ED via POV with his wife, pt's wife reports that patient was standing in the kitchen with her and started speaking jibberish to her. Upon arrival pt unable to name a pen, a cell phone, or watch, pt states "yes yes, its a comma, it's a comma". Pt is alert, intermittently worsening aphasia when speaking, no drift, equal grip strengths noted in triage, no apparent differences when touched.   [place] 2045, symptoms noted at 2110 by patient's wife.

## 2021-02-28 NOTE — Progress Notes (Signed)
Chaplain Maggie responded to code stroke to ED19. Pt was at CT. No family present at that moment. Contact on call chaplain to request support to pt and/or family.

## 2021-02-28 NOTE — ED Notes (Signed)
EDP CLEARED FOR CT, PT TRANSPORTED TO CT.

## 2021-02-28 NOTE — ED Notes (Signed)
CBG 137

## 2021-02-28 NOTE — ED Notes (Signed)
Activated Code Stroke 

## 2021-02-28 NOTE — ED Notes (Signed)
10 ml flush given before and after TNK

## 2021-02-28 NOTE — ED Provider Notes (Signed)
[facility] Emergency Department Provider Note ____________________________________________   Event Date/Time   First MD Initiated Contact with Patient 02/28/21 2211     (approximate)  I have reviewed the triage vital signs and the nursing notes.  HISTORY  Chief Complaint Code Stroke   HPI Dustin Crane is a 80 y.o. malewho presents to the ED for evaluation of difficulty speaking.  Chart review indicates HTN, HLD, DM and CAD.  Patient presents to the ED, accompanied by his wife for evaluation of acute aphasia.  She was with him this evening and last known normal was about 9:10 PM.  He suddenly was unable to get his words out and having word finding difficulties.  No dysarthria, falls or syncope, weakness to the extremities.  He denies pain anywhere such as headache, neck pain or chest pain.  He acknowledges he is unable to get some of his words out appropriately and reports frustration with this.   Past Medical History:  Diagnosis Date   Arthritis    Cancer ([place])    skin cancer with tags removed   Coronary artery disease    Diabetes mellitus without complication ([place])    GERD (gastroesophageal reflux disease)    Hyperlipemia    Hypertension    Myocardial infarction [facility]) 2004   2 stents   Peripheral vascular disease ([place])    TIA (transient ischemic attack) 10/2018   PATIENT DENIES THIS DIAGNOSIS   Wears glasses    Wears hearing aid    both ears    Patient Active Problem List   Diagnosis Date Noted   Sepsis ([place]) 32/95/1884   Acute metabolic encephalopathy 16/60/6301   Sepsis secondary to UTI ([place]) 05/27/2020   Delirium 05/27/2020   Seizure ([place]) 05/27/2020   Hypokalemia 05/27/2020   Hypomagnesemia 05/27/2020   Anemia, unspecified 11/27/2018   CAD (coronary artery disease) 11/27/2018   Complete rupture of rotator cuff 11/27/2018   Disorder of bursae and tendons in shoulder region 11/27/2018   Neck pain 11/27/2018   Osteoarthritis  11/27/2018   Sinoatrial node dysfunction ([place]) 11/27/2018   Carotid stenosis 11/27/2018   Other chest pain 10/26/2017   Acute pain of left knee 04/21/2017   Gastroesophageal reflux disease without esophagitis 05/06/2016   Diabetes mellitus with hyperglycemia ([place]) 11/03/2015   Mild mitral insufficiency 04/09/2015   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   Benign essential hypertension 07/26/2014   Mixed hyperlipidemia 08/09/2013    Past Surgical History:  Procedure Laterality Date   ACHILLES TENDON SURGERY Right 12/07/2018   Procedure: ACHILLES TENDON REPAIR SECONDARY;  Surgeon: Albertine Patricia, DPM;  Location: [place];  Service: Podiatry;  Laterality: Right;  LMA LOCAL DIABETIC   block   CALCANEAL OSTEOTOMY Right 12/07/2018   Procedure: PARTIAL CALCANECTOMY RIGHT;  Surgeon: Albertine Patricia, DPM;  Location: [place];  Service: Podiatry;  Laterality: Right;   CARDIAC CATHETERIZATION  2005   stents x2   CATARACT EXTRACTION W/PHACO Right 04/22/2020   Procedure: CATARACT EXTRACTION PHACO AND INTRAOCULAR LENS PLACEMENT (IOC) RIGHT DIABETIC 3.65 00:41.9;  Surgeon: Birder Robson, MD;  Location: [place];  Service: Ophthalmology;  Laterality: Right;   CATARACT EXTRACTION W/PHACO Left 05/06/2020   Procedure: CATARACT EXTRACTION PHACO AND INTRAOCULAR LENS PLACEMENT ([place]) LEFT DIABETIC MYALUGIN;  Surgeon: Birder Robson, MD;  Location: [place];  Service: Ophthalmology;  Laterality: Left;  Dexycu 9%  LOT [id number]  EXP 10/05/2020 given @ 0757 inj OS  8.30 1:12.4   COLONOSCOPY     COLONOSCOPY  WITH PROPOFOL N/A 04/03/2020   Procedure: COLONOSCOPY WITH PROPOFOL;  Surgeon: [name], MD;  Location: [facility] ENDOSCOPY;  Service: Gastroenterology;  Laterality: N/A;   CYSTOSCOPY WITH INSERTION OF UROLIFT N/A 05/19/2020   Procedure: CYSTOSCOPY WITH INSERTION OF UROLIFT;  Surgeon: Hollice Espy, MD;  Location: [facility];  Service: Urology;  Laterality: N/A;   FINGER  ARTHROPLASTY Right 12/05/2012   Procedure: RIGHT INDEX FINGER IMPLANT ARTHROPLASTY;  Surgeon: Cammie Sickle., MD;  Location: [place];  Service: Orthopedics;  Laterality: Right;   HERNIA REPAIR  1990   rt ing    QUADRICEPS TENDON REPAIR Left 02/11/2017   Procedure: REPAIR QUADRICEP TENDON;  Surgeon: Leim Fabry, MD;  Location: [facility];  Service: Orthopedics;  Laterality: Left;   SHOULDER ACROMIOPLASTY  1967   [place]    SHOULDER ARTHROSCOPY  2012   right   TONSILLECTOMY      Prior to Admission medications   Medication Sig Start Date End Date Taking? Authorizing Provider  aspirin 81 MG tablet Take 81 mg by mouth daily.    [provider]  atorvastatin (LIPITOR) 40 MG tablet Take 40 mg by mouth in the morning and at bedtime.    [provider]  cholecalciferol (VITAMIN D3) 25 MCG (1000 UNIT) tablet Take 1,000 Units by mouth daily.    [provider]  ferrous sulfate 325 (65 FE) MG tablet Take 325 mg by mouth 3 (three) times a week.    [provider]  glimepiride (AMARYL) 4 MG tablet Take 4 mg by mouth 2 (two) times daily. 08/05/19   [provider]  metFORMIN (GLUCOPHAGE) 500 MG tablet Take 500 mg by mouth 2 (two) times daily with a meal.    [provider]  methocarbamol (ROBAXIN) 500 MG tablet  07/28/20   [provider]  Multiple Vitamins-Minerals (MULTIVITAMIN WITH MINERALS) tablet Take 0.5 tablets by mouth 2 (two) times daily.    [provider]  Omega-3 Fatty Acids (FISH OIL) 1200 MG CAPS Take 1,200 mg by mouth daily.    [provider]  omeprazole (PRILOSEC) 20 MG capsule Take 20 mg by mouth 2 (two) times daily.    [provider]  pioglitazone (ACTOS) 30 MG tablet Take 30 mg by mouth daily. 08/03/19   [provider]  ramipril (ALTACE) 10 MG capsule Hold while you are taking Bactrim.  Can resume afterwards. 06/09/20   Enzo Bi, MD  sitaGLIPtin (JANUVIA)  100 MG tablet Take 100 mg by mouth daily.    [provider]    Allergies Haemophilus influenzae, Hemophilus b polysaccharide vaccine, Influenza vac split [influenza virus vaccine], Hydroxyzine, and Compazine [prochlorperazine edisylate]  Family History  Problem Relation Age of Onset   Coronary artery disease Mother    Diabetes Mother    Alzheimer's disease Mother    Heart disease Father    Diabetes Father     Social History Social History   Tobacco Use   Smoking status: Former    Types: Cigarettes    Quit date: 1978    Years since quitting: 45.0   Smokeless tobacco: Never  Vaping Use   Vaping Use: Never used  Substance Use Topics   Alcohol use: Yes    Comment: occ   Drug use: No   Review of Systems  Constitutional: No fever/chills Eyes: No visual changes. ENT: No sore throat. Cardiovascular: Denies chest pain. Respiratory: Denies shortness of breath. Gastrointestinal: No abdominal pain.  No nausea,  no vomiting.  No diarrhea.  No constipation. Genitourinary: Negative for dysuria. Musculoskeletal: Negative for back pain. Skin: Negative for rash. Neurological: Negative for headaches, focal weakness or numbness.  Positive for aphasia ____________________________________________   PHYSICAL EXAM:  VITAL SIGNS: Vitals:   02/28/21 2245 02/28/21 2253  BP: (!) 149/91 (!) 143/90  Pulse: (!) 102 99  Resp: (!) 24 (!) 21  Temp:    SpO2: 96% 98%      Constitutional: Alert and oriented. Well appearing and in no acute distress.  Mostly fluent in conversation, but has difficulty with particular tasks such as naming.  No dysarthria. Eyes: Conjunctivae are normal. PERRL. EOMI. Head: Atraumatic. Nose: No congestion/rhinnorhea. Mouth/Throat: Mucous membranes are moist.  Oropharynx non-erythematous. Neck: No stridor. No cervical spine tenderness to palpation. Cardiovascular: Normal rate, regular rhythm. Grossly normal heart sounds.  Good peripheral  circulation. Respiratory: Normal respiratory effort.  No retractions. Lungs CTAB. Gastrointestinal: Soft , nondistended, nontender to palpation. No CVA tenderness. Musculoskeletal: No lower extremity tenderness nor edema.  No joint effusions. No signs of acute trauma. Neurologic:   No gross focal neurologic deficits are appreciated. No gait instability noted. Some word finding difficulties without dysarthria Skin:  Skin is warm, dry and intact. No rash noted. Psychiatric: Mood and affect are normal. Speech and behavior are normal.  ____________________________________________   LABS (all labs ordered are listed, but only abnormal results are displayed)  Labs Reviewed  CBC - Abnormal; Notable for the following components:      Result Value   HCT 38.9 (*)    All other components within normal limits  CBG MONITORING, ED - Abnormal; Notable for the following components:   Glucose-Capillary 137 (*)    All other components within normal limits  RESP PANEL BY RT-PCR (FLU A&B, COVID) ARPGX2  DIFFERENTIAL  PROTIME-INR  APTT  COMPREHENSIVE METABOLIC PANEL  I-STAT CREATININE, ED   ____________________________________________  12 Lead EKG  Sinus rhythm, rate of 107 bpm.  Couple PVCs.  Normal axis and intervals.  No evidence of acute ischemia. ____________________________________________  RADIOLOGY  ED MD interpretation:  CT head reviewed by me without evidence of acute intracranial pathology.   Official radiology report(s): CT HEAD CODE STROKE WO CONTRAST  Result Date: 02/28/2021 CLINICAL DATA:  Code stroke.  Aphasia EXAM: CT HEAD WITHOUT CONTRAST TECHNIQUE: Contiguous axial images were obtained from the base of the skull through the vertex without intravenous contrast. COMPARISON:  None. FINDINGS: Brain: There is no mass, hemorrhage or extra-axial collection. The size and configuration of the ventricles and extra-axial CSF spaces are normal. There is hypoattenuation of the  periventricular white matter, most commonly indicating chronic ischemic microangiopathy. Vascular: Atherosclerotic calcification of the internal carotid arteries at the skull base. No abnormal hyperdensity of the major intracranial arteries or dural venous sinuses. Skull: The visualized skull base, calvarium and extracranial soft tissues are normal. Sinuses/Orbits: No fluid levels or advanced mucosal thickening of the visualized paranasal sinuses. No mastoid or middle ear effusion. The orbits are normal. ASPECTS [facility] Stroke Program Early CT Score) - Ganglionic level infarction (caudate, lentiform nuclei, internal capsule, insula, M1-M3 cortex): 7 - Supraganglionic infarction (M4-M6 cortex): 3 Total score (0-10 with 10 being normal): 10 IMPRESSION: 1. Chronic ischemic microangiopathy without acute intracranial abnormality. 2. ASPECTS is 10. These results were called by telephone at the time of interpretation on 02/28/2021 at 10:31 pm to provider [facility] , who verbally acknowledged these results. Electronically Signed   By: Ulyses Jarred M.D.   On: 02/28/2021  22:31    ____________________________________________   PROCEDURES and INTERVENTIONS  Procedure(s) performed (including Critical Care):  .1-3 Lead EKG Interpretation Performed by: Vladimir Crofts, MD Authorized by: Vladimir Crofts, MD     Interpretation: normal     ECG rate:  98   ECG rate assessment: normal     Rhythm: sinus rhythm     Ectopy: none     Conduction: normal   .Critical Care Performed by: Vladimir Crofts, MD Authorized by: Vladimir Crofts, MD   Critical care provider statement:    Critical care time (minutes):  30   Critical care time was exclusive of:  Separately billable procedures and treating other patients   Critical care was necessary to treat or prevent imminent or life-threatening deterioration of the following conditions:  CNS failure or compromise   Critical care was time spent personally by me on the following  activities:  Development of treatment plan with patient or surrogate, discussions with consultants, evaluation of patient's response to treatment, examination of patient, ordering and review of laboratory studies, ordering and review of radiographic studies, ordering and performing treatments and interventions, pulse oximetry, re-evaluation of patient's condition and review of old charts  Medications  tenecteplase (TNKASE) 50 MG injection for Stroke (  Canceled Entry 02/28/21 2253)  labetalol (NORMODYNE) injection 20 mg (has no administration in time range)    And  nicardipine (CARDENE) 20mg  in 0.86% saline 254ml IV infusion (0.1 mg/ml) (has no administration in time range)  sodium chloride flush (NS) 0.9 % injection 3 mL (3 mLs Intravenous Given 02/28/21 2252)  tenecteplase (TNKASE) injection for Stroke 20 mg (20 mg Intravenous Given 02/28/21 2252)    ____________________________________________   MDM / ED COURSE  80 year old male presents to the ED with acute aphasia concerning for an acute ischemic stroke requiring TNK and admission.  Looks clinically well without distress but has some word finding difficulties.  No strength or sensation deficits to the extremities or other cranial nerve deficits appreciated.  CT head without evidence of ICH or mass.  Blood work returning benign and without evidence of hypoglycemia.  He is a candidate for TN K and this is started with the assistance of neurology consult.  We will admit to medicine for further work-up and management of his stroke      ____________________________________________   FINAL CLINICAL IMPRESSION(S) / ED DIAGNOSES  Final diagnoses:  Acute ischemic stroke [facility])  Aphasia     ED Discharge Orders     None        Wilton Thrall   Note:  This document was prepared using Dragon voice recognition software and may include unintentional dictation errors.    Vladimir Crofts, MD 02/28/21 [phone]

## 2021-03-01 ENCOUNTER — Inpatient Hospital Stay: Payer: Medicare Other

## 2021-03-01 ENCOUNTER — Inpatient Hospital Stay
Admit: 2021-03-01 | Discharge: 2021-03-01 | Disposition: A | Discharge status: Home / Self Care | Payer: Medicare Other | Attending: Pulmonary Disease | Admitting: Pulmonary Disease

## 2021-03-01 DIAGNOSIS — I252 Old myocardial infarction: Secondary | ICD-10-CM | POA: Diagnosis not present

## 2021-03-01 DIAGNOSIS — I639 Cerebral infarction, unspecified: Secondary | ICD-10-CM | POA: Diagnosis not present

## 2021-03-01 DIAGNOSIS — D649 Anemia, unspecified: Secondary | ICD-10-CM | POA: Diagnosis present

## 2021-03-01 DIAGNOSIS — I10 Essential (primary) hypertension: Secondary | ICD-10-CM | POA: Diagnosis present

## 2021-03-01 DIAGNOSIS — Z8673 Personal history of transient ischemic attack (TIA), and cerebral infarction without residual deficits: Secondary | ICD-10-CM | POA: Diagnosis not present

## 2021-03-01 DIAGNOSIS — Z955 Presence of coronary angioplasty implant and graft: Secondary | ICD-10-CM | POA: Diagnosis not present

## 2021-03-01 DIAGNOSIS — N400 Enlarged prostate without lower urinary tract symptoms: Secondary | ICD-10-CM | POA: Diagnosis present

## 2021-03-01 DIAGNOSIS — Z79899 Other long term (current) drug therapy: Secondary | ICD-10-CM | POA: Diagnosis not present

## 2021-03-01 DIAGNOSIS — K219 Gastro-esophageal reflux disease without esophagitis: Secondary | ICD-10-CM | POA: Diagnosis present

## 2021-03-01 DIAGNOSIS — E1165 Type 2 diabetes mellitus with hyperglycemia: Secondary | ICD-10-CM | POA: Diagnosis present

## 2021-03-01 DIAGNOSIS — E1151 Type 2 diabetes mellitus with diabetic peripheral angiopathy without gangrene: Secondary | ICD-10-CM | POA: Diagnosis present

## 2021-03-01 DIAGNOSIS — I2510 Atherosclerotic heart disease of native coronary artery without angina pectoris: Secondary | ICD-10-CM | POA: Diagnosis present

## 2021-03-01 DIAGNOSIS — Z85828 Personal history of other malignant neoplasm of skin: Secondary | ICD-10-CM | POA: Diagnosis not present

## 2021-03-01 DIAGNOSIS — Z87891 Personal history of nicotine dependence: Secondary | ICD-10-CM | POA: Diagnosis not present

## 2021-03-01 DIAGNOSIS — Z7984 Long term (current) use of oral hypoglycemic drugs: Secondary | ICD-10-CM | POA: Diagnosis not present

## 2021-03-01 DIAGNOSIS — Z7982 Long term (current) use of aspirin: Secondary | ICD-10-CM | POA: Diagnosis not present

## 2021-03-01 DIAGNOSIS — Z8249 Family history of ischemic heart disease and other diseases of the circulatory system: Secondary | ICD-10-CM | POA: Diagnosis not present

## 2021-03-01 DIAGNOSIS — R4701 Aphasia: Secondary | ICD-10-CM | POA: Diagnosis present

## 2021-03-01 DIAGNOSIS — I6523 Occlusion and stenosis of bilateral carotid arteries: Secondary | ICD-10-CM | POA: Diagnosis present

## 2021-03-01 DIAGNOSIS — Z888 Allergy status to other drugs, medicaments and biological substances status: Secondary | ICD-10-CM | POA: Diagnosis not present

## 2021-03-01 DIAGNOSIS — I7389 Other specified peripheral vascular diseases: Secondary | ICD-10-CM | POA: Diagnosis present

## 2021-03-01 DIAGNOSIS — Z20822 Contact with and (suspected) exposure to covid-19: Secondary | ICD-10-CM | POA: Diagnosis present

## 2021-03-01 DIAGNOSIS — E785 Hyperlipidemia, unspecified: Secondary | ICD-10-CM | POA: Diagnosis present

## 2021-03-01 LAB — CBC
HCT: 35.3 % — ABNORMAL LOW (ref 39.0–52.0)
Hemoglobin: 12.6 g/dL — ABNORMAL LOW (ref 13.0–17.0)
MCH: 30.9 pg (ref 26.0–34.0)
MCHC: 35.7 g/dL (ref 30.0–36.0)
MCV: 86.5 fL (ref 80.0–100.0)
Platelets: 216 10*3/uL (ref 150–400)
RBC: 4.08 MIL/uL — ABNORMAL LOW (ref 4.22–5.81)
RDW: 11.9 % (ref 11.5–15.5)
WBC: 10.7 10*3/uL — ABNORMAL HIGH (ref 4.0–10.5)
nRBC: 0 % (ref 0.0–0.2)

## 2021-03-01 LAB — BASIC METABOLIC PANEL
Anion gap: 10 (ref 5–15)
BUN: 13 mg/dL (ref 8–23)
CO2: 26 mmol/L (ref 22–32)
Calcium: 8.8 mg/dL — ABNORMAL LOW (ref 8.9–10.3)
Chloride: 100 mmol/L (ref 98–111)
Creatinine, Ser: 0.62 mg/dL (ref 0.61–1.24)
GFR, Estimated: >60 mL/min (ref >60)
Glucose, Bld: 99 mg/dL (ref 70–99)
Potassium: 4 mmol/L (ref 3.5–5.1)
Sodium: 136 mmol/L (ref 135–145)

## 2021-03-01 LAB — ECHOCARDIOGRAM COMPLETE
AR max vel: 2.1 cm2
AV Peak grad: 7.6 mmHg
Ao pk vel: 1.38 m/s
Area-P 1/2: 3.97 cm2
Calc EF: 47.8 %
Height: 69 in
P 1/2 time: 337 msec
S' Lateral: 3.8 cm
Single Plane A2C EF: 50 %
Single Plane A4C EF: 50.3 %
Weight: 2733.7 oz

## 2021-03-01 LAB — GLUCOSE, CAPILLARY
Glucose-Capillary: 102 mg/dL — ABNORMAL HIGH (ref 70–99)
Glucose-Capillary: 105 mg/dL — ABNORMAL HIGH (ref 70–99)
Glucose-Capillary: 172 mg/dL — ABNORMAL HIGH (ref 70–99)
Glucose-Capillary: 223 mg/dL — ABNORMAL HIGH (ref 70–99)

## 2021-03-01 LAB — MAGNESIUM
Magnesium: 0.8 mg/dL — CL (ref 1.7–2.4)
Magnesium: 2.3 mg/dL (ref 1.7–2.4)

## 2021-03-01 LAB — LIPID PANEL
Cholesterol: 101 mg/dL (ref 0–200)
HDL: 37 mg/dL — ABNORMAL LOW (ref >40)
LDL Cholesterol: 51 mg/dL (ref 0–99)
Total CHOL/HDL Ratio: 2.7 RATIO
Triglycerides: 63 mg/dL (ref <150)
VLDL: 13 mg/dL (ref 0–40)

## 2021-03-01 LAB — MRSA NEXT GEN BY PCR, NASAL: MRSA by PCR Next Gen: NOT DETECTED

## 2021-03-01 LAB — PHOSPHORUS: Phosphorus: 4.8 mg/dL — ABNORMAL HIGH (ref 2.5–4.6)

## 2021-03-01 LAB — RESP PANEL BY RT-PCR (FLU A&B, COVID) ARPGX2
Influenza A by PCR: NEGATIVE
Influenza B by PCR: NEGATIVE
SARS Coronavirus 2 by RT PCR: NEGATIVE

## 2021-03-01 MED ORDER — SENNOSIDES-DOCUSATE SODIUM 8.6-50 MG PO TABS: 1.0000 | ORAL_TABLET | Freq: Every evening | ORAL | Status: DC | PRN

## 2021-03-01 MED ORDER — METOPROLOL TARTRATE 25 MG PO TABS
12.5000 mg | ORAL_TABLET | Freq: Two times a day (BID) | ORAL | Status: DC
  Administered 2021-03-01 – 2021-03-02 (×3): 12.5 mg via ORAL
  Filled 2021-03-01 (×3): qty 1

## 2021-03-01 MED ORDER — PANTOPRAZOLE SODIUM 40 MG IV SOLR
40.0000 mg | Freq: Two times a day (BID) | INTRAVENOUS | Status: DC
  Administered 2021-03-01 – 2021-03-02 (×3): 40 mg via INTRAVENOUS
  Filled 2021-03-01 (×3): qty 40

## 2021-03-01 MED ORDER — ACETAMINOPHEN 325 MG PO TABS: 650.0000 mg | ORAL_TABLET | ORAL | Status: DC | PRN

## 2021-03-01 MED ORDER — ACETAMINOPHEN 160 MG/5ML PO SOLN
650.0000 mg | ORAL | Status: DC | PRN
  Filled 2021-03-01: qty 20.3

## 2021-03-01 MED ORDER — ATORVASTATIN CALCIUM 20 MG PO TABS
40.0000 mg | ORAL_TABLET | Freq: Two times a day (BID) | ORAL | Status: DC
  Administered 2021-03-01 – 2021-03-02 (×3): 40 mg via ORAL
  Filled 2021-03-01 (×3): qty 2

## 2021-03-01 MED ORDER — MAGNESIUM SULFATE 2 GM/50ML IV SOLN
2.0000 g | Freq: Once | INTRAVENOUS | Status: AC
  Administered 2021-03-01: 09:00:00 2 g via INTRAVENOUS
  Filled 2021-03-01: qty 50

## 2021-03-01 MED ORDER — PANTOPRAZOLE SODIUM 40 MG IV SOLR: 40.0000 mg | Freq: Every day | INTRAVENOUS | Status: DC

## 2021-03-01 MED ORDER — LABETALOL HCL 5 MG/ML IV SOLN: 10.0000 mg | INTRAVENOUS | Status: DC | PRN

## 2021-03-01 MED ORDER — MAGNESIUM SULFATE 4 GM/100ML IV SOLN
4.0000 g | Freq: Once | INTRAVENOUS | Status: AC
  Administered 2021-03-01: 07:00:00 4 g via INTRAVENOUS
  Filled 2021-03-01: qty 100

## 2021-03-01 MED ORDER — CHLORHEXIDINE GLUCONATE CLOTH 2 % EX PADS
6.0000 | MEDICATED_PAD | Freq: Every day | CUTANEOUS | Status: DC
  Administered 2021-03-01 – 2021-03-02 (×2): 6 via TOPICAL

## 2021-03-01 MED ORDER — LACTATED RINGERS IV SOLN: INTRAVENOUS | Status: AC

## 2021-03-01 MED ORDER — STROKE: EARLY STAGES OF RECOVERY BOOK: Freq: Once | Status: AC

## 2021-03-01 MED ORDER — SODIUM CHLORIDE 0.9 % IV SOLN: INTRAVENOUS | Status: DC | PRN

## 2021-03-01 MED ORDER — ACETAMINOPHEN 650 MG RE SUPP: 650.0000 mg | RECTAL | Status: DC | PRN

## 2021-03-01 MED ORDER — INSULIN ASPART 100 UNIT/ML IJ SOLN
0.0000 [IU] | INTRAMUSCULAR | Status: DC
  Administered 2021-03-01: 12:00:00 3 [IU] via SUBCUTANEOUS
  Administered 2021-03-01: 16:00:00 5 [IU] via SUBCUTANEOUS
  Administered 2021-03-01 – 2021-03-02 (×2): 2 [IU] via SUBCUTANEOUS
  Filled 2021-03-01 (×3): qty 1

## 2021-03-01 NOTE — H&P (Signed)
NAME:  Dustin Crane, MRN:  [id number], DOB:  04/15/1940, LOS: 0 ADMISSION DATE:  02/28/2021, CONSULTATION DATE: 03/01/2021 REFERRING MD:  Charlsie Quest, CHIEF COMPLAINT:  Aphasia-CODE STROKE   History of Present Illness:  80 year old male presenting to [facility] ED from home on 02/28/2021 with complaints of acute aphasia.  Per wife's description she was with the patient on 02/28/21 and last known normal was about 9:10 PM.  He suddenly was unable to get his words out and having word finding difficulties.  No dysarthria, no blurred vision, falls or syncope, no weakness to the extremities and he denies pain anywhere such as a headache/neck or chest pain.  He acknowledged that he was unable to get some of his words out appropriately and reported frustration at this. ED course: Patient with acute aphasia and concern for acute ischemic stroke. EDP noted no strength or sensation deficits to the extremities, no other cranial nerve deficits were appreciated.  CT head without evidence of ICH or mass, no evidence of hypoglycemia.  Stat telemetry neuro consult obtained, initial NIHSS: 4, patient considered a candidate for TNKase which was initiated.  Medications given: TNKase 20 mg, IV contrast, Zofran and Protonix Initial Vitals: Afebrile at 98.3, RR 18, HR 99, BP 172/93 and SPO2 95% on room air Significant labs: (Labs/ Imaging personally reviewed) I, Domingo Pulse [place], AGACNP-BC, personally viewed and interpreted this ECG. 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 ASPECTS 10 CTangio head/neck/perfusion 02/28/2021: No emergent LVO or high-grade stenosis of the intracranial arteries.  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  Normal CT perfusion  PCCM consulted for admission due to Acute Ischemic Stroke status post TNKase administration. Pertinent  Medical History  Hypertension Hyperlipidemia Type 2 diabetes mellitus CAD Skin cancer MI (2004, 2 stents placed) PVD Hard of hearing with hearing aids  Significant Hospital Events: Including procedures, antibiotic start and stop dates in addition to other pertinent events   02/28/21: Admit to ICU with acute ischemic stroke s/p TNKase administration  Interim History / Subjective:  Patient with no current complaints, VSS with wife bedside- see HPI above. NIHSS post TNKase consistently 0.  Objective   Blood pressure (!) 147/93, pulse (!) 102, temperature 98.3 F (36.8 C), temperature source Oral, resp. rate (!) 24, height 5\' 9"  (1.753 m), weight 78.8 kg, SpO2 94 %.        Intake/Output Summary (Last 24 hours) at 03/01/2021 0031 Last data filed at 02/28/2021 2252 Gross per 24 hour  Intake 10 ml  Output --  Net 10 ml   Filed Weights   02/28/21 2249  Weight: 78.8 kg    Examination: General: Adult male, acutely ill, lying in bed, NAD HEENT: MM pink/moist, anicteric, atraumatic, neck supple-glasses in place Neuro: A&O x 4, follows commands, PERRL +3 (L pupil slightly irregular-cataracts surgery), MAE - Level of arousal and orientation to time, place, and person were intact. - Language including expression, naming, repetition, comprehension was assessed and found intact. - Attention span and concentration were normal - Recent and remote memory were intact - Fund of Knowledge was assessed and was intact CN: II - Visual field intact OU III, IV, VI - Extraocular movements intact. V - Facial sensation intact bilaterally. VII - Facial movement intact bilaterally VIII -  Hearing & vestibular intact bilaterally X - Palate elevates symmetrically XI - Chin turning & shoulder shrug intact bilaterally. XII -  Tongue protrusion intact Motor Strength - strength 5/5 all extremities and pronator drift was absent.  Sensory - Light touch was assessed and was symmetrical Coordination - The patient had normal movements in the hands and feet with no ataxia or dysmetria.  Tremor was absent Gait and Station - deferred. 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 ASPECTS 10, CTangio head/neck/perfusion 02/28/2021: No emergent LVO or high-grade stenosis of the intracranial arteries.  Bilateral proximal internal carotid artery stenosis measuring 50% by NASCET criteria.  Normal CT perfusion - modified Stroke Scale (mNIHSS) q26m x 2h, q15m x 6h, q1h x 16h - NIHSS Q shift - perform bedside swallow > SLP eval if needed - PT/OT consult - Neurology consulted, appreciate input - f/u MRI/MRA ordered in 24 hours - Echocardiogram ordered - f/u Lipid panel & Hgb A1c  -SCD's for VTE prophylaxis for 1st 24 h - continuous cardiac monitoring  Type 2 Diabetes Mellitus Controlled, Currently on metformin, sitagliptin, glimepiride & pioglitazone - HgbA1c pending, goal < 7.0, f/u in AM - Q 4 CBG monitoring - SSI - follow ICU hypo/hyper-glycemia protocol  Hypertension BP 140's/80's after initial 172/93 - utilize labetalol PRN if needed to maintain BP < 180/105 for 24 hours - consider restarting outpatient ramipril once stabilized  Hyperlipidemia LDL pending, goal < 70 - continue home regimen: atorvastatin  40 mg BID, adjust dosing if needed  GERD - protonix BID  Best Practice (right click and "Reselect all SmartList Selections" daily)  Diet/type: NPO w/ oral meds DVT prophylaxis: SCD GI prophylaxis: PPI Lines: N/A Foley:  N/A Code Status:  full code Last date of multidisciplinary goals of care discussion [03/01/21]  Labs   CBC: Recent Labs  Lab 02/28/21 2245  WBC 8.5  NEUTROABS 4.7  HGB 13.5  HCT 38.9*  MCV 88.2  PLT 284    Basic Metabolic Panel: Recent Labs  Lab 02/28/21 2245  NA 139  K 4.1  CL 103  CO2 24  GLUCOSE 131*  BUN 13  CREATININE 0.78  CALCIUM 8.8*   GFR: Estimated Creatinine Clearance: 76.1 mL/min (by C-G formula based on SCr of 0.78 mg/dL). Recent Labs  Lab 02/28/21 2245  WBC 8.5    Liver Function Tests: Recent Labs  Lab 02/28/21 2245  AST 21  ALT 21  ALKPHOS 63  BILITOT 0.8  PROT 7.5  ALBUMIN 4.2   No results for input(s): LIPASE, AMYLASE in the last 168 hours. No results for input(s): AMMONIA in the last 168 hours.  ABG    Component Value Date/Time   HCO3 25.3 05/27/2020 1429   TCO2 22 12/05/2012 0659   O2SAT 81.7 05/27/2020 1429     Coagulation Profile: Recent Labs  Lab 02/28/21 2245  INR 1.0    Cardiac Enzymes: No results for input(s): CKTOTAL, CKMB, CKMBINDEX, TROPONINI in the last 168 hours.  HbA1C: Hgb A1c MFr Bld  Date/Time Value Ref Range Status  05/27/2020 06:43 PM 8.1 (H) 4.8 - 5.6 % Final    Comment:    (NOTE) Pre diabetes:          5.7%-6.4%  Diabetes:              >6.4%  Glycemic control for   <7.0% adults with diabetes     CBG: Recent Labs  Lab 02/28/21 2208  GLUCAP 137*    Review of Systems: Positives in BOLD  Gen: Denies fever, chills, weight change, fatigue, night sweats HEENT: Denies blurred vision, double vision, hearing loss, tinnitus, sinus congestion, rhinorrhea, sore throat, neck stiffness, dysphagia PULM: Denies shortness of breath, cough, sputum production, hemoptysis,  wheezing CV: Denies chest pain, edema, orthopnea, paroxysmal nocturnal dyspnea, palpitations GI: Denies abdominal pain, nausea, vomiting, diarrhea, hematochezia, melena, constipation, change in bowel habits GU: Denies dysuria, hematuria, polyuria, oliguria, urethral discharge Endocrine: Denies hot or cold intolerance, polyuria, polyphagia or appetite change Derm: Denies rash, dry skin, scaling or peeling skin change Heme: Denies easy bruising, bleeding, bleeding gums Neuro: Denies headache, numbness, weakness, slurred speech, loss of memory or consciousness aphasia  Past Medical History:  He,  has a past medical history of Arthritis, Cancer ([place]), Coronary artery disease, Diabetes mellitus without complication ([place]), GERD (gastroesophageal reflux disease), Hyperlipemia, Hypertension, Myocardial infarction ([place]) (2004), Peripheral vascular disease ([place]), TIA (transient ischemic attack) (10/2018), Wears glasses, and Wears hearing aid.   Surgical History:   Past Surgical History:  Procedure Laterality Date   ACHILLES TENDON SURGERY Right 12/07/2018   Procedure: ACHILLES TENDON REPAIR SECONDARY;  Surgeon: Albertine Patricia, DPM;  Location: [place];  Service: Podiatry;  Laterality: Right;  LMA LOCAL DIABETIC   block   CALCANEAL OSTEOTOMY Right 12/07/2018   Procedure: PARTIAL CALCANECTOMY RIGHT;  Surgeon: Albertine Patricia, DPM;  Location: [place];  Service: Podiatry;  Laterality: Right;   CARDIAC CATHETERIZATION  2005   stents x2   CATARACT EXTRACTION W/PHACO Right 04/22/2020   Procedure: CATARACT EXTRACTION PHACO AND INTRAOCULAR LENS PLACEMENT (IOC) RIGHT DIABETIC 3.65 00:41.9;  Surgeon: Birder Robson, MD;  Location: [place];  Service: Ophthalmology;  Laterality: Right;   CATARACT EXTRACTION W/PHACO Left 05/06/2020   Procedure: CATARACT EXTRACTION PHACO AND INTRAOCULAR LENS PLACEMENT ([place]) LEFT DIABETIC MYALUGIN;  Surgeon: Birder Robson, MD;  Location:  [place];  Service: Ophthalmology;  Laterality: Left;  Dexycu 9%  LOT [id number]  EXP 10/05/2020 given @ 0757 inj OS  8.30 1:12.4   COLONOSCOPY     COLONOSCOPY WITH PROPOFOL N/A 04/03/2020   Procedure: COLONOSCOPY WITH PROPOFOL;  Surgeon: [name], MD;  Location: [facility] ENDOSCOPY;  Service: Gastroenterology;  Laterality: N/A;   CYSTOSCOPY WITH INSERTION OF UROLIFT N/A 05/19/2020   Procedure: CYSTOSCOPY WITH INSERTION OF UROLIFT;  Surgeon: Hollice Espy, MD;  Location: [facility] ORS;  Service: Urology;  Laterality: N/A;   FINGER ARTHROPLASTY Right 12/05/2012   Procedure: RIGHT INDEX FINGER IMPLANT ARTHROPLASTY;  Surgeon: Cammie Sickle., MD;  Location: [place];  Service: Orthopedics;  Laterality: Right;   HERNIA REPAIR  1990   rt ing    QUADRICEPS TENDON REPAIR Left 02/11/2017   Procedure: REPAIR QUADRICEP TENDON;  Surgeon: Leim Fabry, MD;  Location: [facility] OR[facility];  Service: Orthopedics;  Laterality: Left;   SHOULDER ACROMIOPLASTY  1967   [place]    SHOULDER ARTHROSCOPY  2012   right   TONSILLECTOMY       Social History:   reports that  he quit smoking about 45 years ago. His smoking use included cigarettes. He has never used smokeless tobacco. He reports current alcohol use. He reports that he does not use drugs.   Family History:  His family history includes Alzheimer's disease in his mother; Coronary artery disease in his mother; Diabetes in his father and mother; Heart disease in his father.   Allergies Allergies  Allergen Reactions   Haemophilus Influenzae Anaphylaxis, Shortness Of Breath and Rash    Rash-severe n/v/d Ended up in hospital after taking!    Hemophilus B Polysaccharide Vaccine Anaphylaxis, Shortness Of Breath and Rash    Rash-severe n/v/d    Influenza Vac Split [Influenza Virus Vaccine] Shortness Of Breath and Other (See Comments)    Rash-severe n/v/d   Hydroxyzine     Patient denies?????   Compazine [Prochlorperazine  Edisylate] Other (See Comments)    Muscle tightness     Home Medications  Prior to Admission medications   Medication Sig Start Date End Date Taking? Authorizing Provider  aspirin 81 MG tablet Take 81 mg by mouth daily.    [provider]  atorvastatin (LIPITOR) 40 MG tablet Take 40 mg by mouth in the morning and at bedtime.    [provider]  cholecalciferol (VITAMIN D3) 25 MCG (1000 UNIT) tablet Take 1,000 Units by mouth daily.    [provider]  ferrous sulfate 325 (65 FE) MG tablet Take 325 mg by mouth 3 (three) times a week.    [provider]  glimepiride (AMARYL) 4 MG tablet Take 4 mg by mouth 2 (two) times daily. 08/05/19   [provider]  metFORMIN (GLUCOPHAGE) 500 MG tablet Take 500 mg by mouth 2 (two) times daily with a meal.    [provider]  methocarbamol (ROBAXIN) 500 MG tablet  07/28/20   [provider]  Multiple Vitamins-Minerals (MULTIVITAMIN WITH MINERALS) tablet Take 0.5 tablets by mouth 2 (two) times daily.    [provider]  Omega-3 Fatty Acids (FISH OIL) 1200 MG CAPS Take 1,200 mg by mouth daily.    [provider]  omeprazole (PRILOSEC) 20 MG capsule Take 20 mg by mouth 2 (two) times daily.    [provider]  pioglitazone (ACTOS) 30 MG tablet Take 30 mg by mouth daily. 08/03/19   [provider]  ramipril (ALTACE) 10 MG capsule Hold while you are taking Bactrim.  Can resume afterwards. 06/09/20   Enzo Bi, MD  sitaGLIPtin (JANUVIA) 100 MG tablet Take 100 mg by mouth daily.    [provider]     Critical care time: 60 minutes       Venetia Night, AGACNP-BC Acute Care Nurse Practitioner [facility]   [phone] / [phone] Please see Amion for pager details.

## 2021-03-01 NOTE — Progress Notes (Signed)
PT Cancellation Note  Patient Details Name: ROMUALD MCCASLIN MRN: [id number] DOB: 1941/02/16   Cancelled Treatment:    Reason Eval/Treat Not Completed: Patient not medically ready PT orders received, chart reviewed. Per chart pt received TNK & per protocol will hold PT evaluation for 24 hrs. Will f/u as able.  Lavone Nian, PT, DPT 03/01/21, 1:37 PM    Waunita Schooner 03/01/2021, 1:37 PM

## 2021-03-01 NOTE — Progress Notes (Signed)
Patients CBG on arrival to ICU room 04 was 90. Continue to monitor.

## 2021-03-01 NOTE — Progress Notes (Addendum)
Patient had a 14 beat run of SVT with resting HR in low ST (103 range). Attending MD made aware. 12.5 mg metoprolol given.

## 2021-03-01 NOTE — Progress Notes (Signed)
NP notified of critical magnesium level 0.8, see new orders. Continue to monitor.

## 2021-03-01 NOTE — Progress Notes (Signed)
eLink Physician-Brief Progress Note Patient Name: EJ PINSON DOB: [birth year] MRN: [id number]   Date of Service  03/01/2021  HPI/Events of Note  80 year old man with likely stroke. Seen by neuro, giving thrombolytics. Admitted to Icu for further care. HR in 90s. SBP 155. O2 100. No distress on camera  eICU Interventions  Continue management per stroke protocol No anti platelets/ AC for now Need repeat imaging in 24 hours Stroke work up per bedside Call E link if needed     Intervention Category Evaluation Type: New Patient Evaluation  Margaretmary Lombard 03/01/2021, 2:14 AM

## 2021-03-01 NOTE — Plan of Care (Signed)
A&O patient from home with wife. Wife states that the patient began being unable to speak clearly or name objects correctly on 02/28/2021 at 2110. Patient to ED via [place] with wife. Patient noted with intermittent dysphagia in ED, NIHSS was 4 on arrival. TNK was given per order. Patient condition continued to improve, NIHSS was 0, prior to transfer to ICU.

## 2021-03-01 NOTE — Progress Notes (Signed)
NEUROLOGY CONSULTATION NOTE   Date of service: March 01, 2021 Patient Name: Dustin Crane MRN:  [id number] DOB:  04/23/1940 Reason for consult: aphasia c/f acute ischemic stroke, s/p TNK Requesting physician: Dr. Vernard Gambles _ _ _   _ __   _ __ _ _  __ __   _ __   __ _  History of Present Illness   This is a 80 yo man with hx DM2, HTN, HL, prior MI, PVD, CAD who presented to ED with acute onset expressive aphasia. LKW 2110 on 12/24 (last night). Telestroke code activated on arrival, initial NIHSS = 4. CT head NAICP, ASPECTS 10. Patient was given TNK. CTA showed no LVO, CTP was negative. Within one hour of receiving TNK, aphasia resolved and NIHSS = 0. He was admitted to the ICU for post-thrombolytic protocol and stroke workup. Today patient reports his sx have completely resolved and he feels back to baseline. No new neurologic complaints today.  MRI brain pending for tonight (24 hrs post-TNK).   TTE   1. Left ventricular ejection fraction, by estimation, is 45 to 50%. The  left ventricle has mildly decreased function. The left ventricle  demonstrates global hypokinesis. The left ventricular internal cavity size  was mildly dilated. Left ventricular  diastolic parameters are consistent with Grade I diastolic dysfunction  (impaired relaxation).   2. Right ventricular systolic function is low normal. The right  ventricular size is normal.   3. The mitral valve is normal in structure. Trivial mitral valve  regurgitation.   4. The aortic valve is normal in structure. Aortic valve regurgitation is  mild.   Stroke Labs     Component Value Date/Time   CHOL 101 03/01/2021 0445   TRIG 63 03/01/2021 0445   HDL 37 (L) 03/01/2021 0445   CHOLHDL 2.7 03/01/2021 0445   VLDL 13 03/01/2021 0445   LDLCALC 51 03/01/2021 0445    Lab Results  Component Value Date/Time   HGBA1C 8.1 (H) 05/27/2020 06:43 PM      ROS   Per HPI: all other systems reviewed and are negative  Past  History   I have reviewed the following:  Past Medical History:  Diagnosis Date   Arthritis    Cancer ([place])    skin cancer with tags removed   Coronary artery disease    Diabetes mellitus without complication ([place])    GERD (gastroesophageal reflux disease)    Hyperlipemia    Hypertension    Myocardial infarction [facility]) 2004   2 stents   Peripheral vascular disease ([place])    TIA (transient ischemic attack) 10/2018   PATIENT DENIES THIS DIAGNOSIS   Wears glasses    Wears hearing aid    both ears   Past Surgical History:  Procedure Laterality Date   ACHILLES TENDON SURGERY Right 12/07/2018   Procedure: ACHILLES TENDON REPAIR SECONDARY;  Surgeon: Albertine Patricia, DPM;  Location: [place];  Service: Podiatry;  Laterality: Right;  LMA LOCAL DIABETIC   block   CALCANEAL OSTEOTOMY Right 12/07/2018   Procedure: PARTIAL CALCANECTOMY RIGHT;  Surgeon: Albertine Patricia, DPM;  Location: [place];  Service: Podiatry;  Laterality: Right;   CARDIAC CATHETERIZATION  2005   stents x2   CATARACT EXTRACTION W/PHACO Right 04/22/2020   Procedure: CATARACT EXTRACTION PHACO AND INTRAOCULAR LENS PLACEMENT (I[facility]) RIGHT DIABETIC 3.65 00:41.9;  Surgeon: Birder Robson, MD;  Location: [place];  Service: Ophthalmology;  Laterality: Right;   CATARACT EXTRACTION W/PHACO Left 05/06/2020  Procedure: CATARACT EXTRACTION PHACO AND INTRAOCULAR LENS PLACEMENT ([place]) LEFT DIABETIC MYALUGIN;  Surgeon: Birder Robson, MD;  Location: [place];  Service: Ophthalmology;  Laterality: Left;  Dexycu 9%  LOT [id number]  EXP 10/05/2020 given @ 0757 inj OS  8.30 1:12.4   COLONOSCOPY     COLONOSCOPY WITH PROPOFOL N/A 04/03/2020   Procedure: COLONOSCOPY WITH PROPOFOL;  Surgeon: [name], MD;  Location[facility] ENDOSCOPY;  Service: Gastroenterology;  Laterality: N/A;   CYSTOSCOPY WITH INSERTION OF UROLIFT N/A 05/19/2020   Procedure: CYSTOSCOPY WITH INSERTION OF UROLIFT;  Surgeon:  Hollice Espy, MD;  Location: [facility];  Service: Urology;  Laterality: N/A;   FINGER ARTHROPLASTY Right 12/05/2012   Procedure: RIGHT INDEX FINGER IMPLANT ARTHROPLASTY;  Surgeon: Cammie Sickle., MD;  Location: [place];  Service: Orthopedics;  Laterality: Right;   HERNIA REPAIR  1990   rt ing    QUADRICEPS TENDON REPAIR Left 02/11/2017   Procedure: REPAIR QUADRICEP TENDON;  Surgeon: Leim Fabry, MD;  Location: [facility];  Service: Orthopedics;  Laterality: Left;   SHOULDER ACROMIOPLASTY  1967   [place]    SHOULDER ARTHROSCOPY  2012   right   TONSILLECTOMY     Family History  Problem Relation Age of Onset   Coronary artery disease Mother    Diabetes Mother    Alzheimer's disease Mother    Heart disease Father    Diabetes Father    Social History   Socioeconomic History   Marital status: Married    Spouse name: mary   Number of children: Not on file   Years of education: Not on file   Highest education level: Not on file  Occupational History   Occupation: MILITARY    Comment: RETIRED  Tobacco Use   Smoking status: Former    Types: Cigarettes    Quit date: 1978    Years since quitting: 45.0   Smokeless tobacco: Never  Vaping Use   Vaping Use: Never used  Substance and Sexual Activity   Alcohol use: Yes    Comment: occ   Drug use: No   Sexual activity: Not on file  Other Topics Concern   Not on file  Social History Narrative   Patient lives wife and a dog.   Feels safe in his home.   Starting to exercise since leg is beginning to heal.   Social Determinants of Health   Financial Resource Strain: Not on file  Food Insecurity: Not on file  Transportation Needs: Not on file  Physical Activity: Not on file  Stress: Not on file  Social Connections: Not on file   Allergies  Allergen Reactions   Haemophilus Influenzae Anaphylaxis, Shortness Of Breath and Rash    Rash-severe n/v/d Ended up in hospital after taking!     Hemophilus B Polysaccharide Vaccine Anaphylaxis, Shortness Of Breath and Rash    Rash-severe n/v/d    Influenza Vac Split [Influenza Virus Vaccine] Shortness Of Breath and Other (See Comments)    Rash-severe n/v/d   Hydroxyzine     Patient denies?????   Compazine [Prochlorperazine Edisylate] Other (See Comments)    Muscle tightness    Medications   Medications Prior to Admission  Medication Sig Dispense Refill Last Dose   aspirin 81 MG tablet Take 81 mg by mouth daily.      atorvastatin (LIPITOR) 40 MG tablet Take 40 mg by mouth in the morning and at bedtime.      cholecalciferol (VITAMIN D3)  25 MCG (1000 UNIT) tablet Take 1,000 Units by mouth daily.      ferrous sulfate 325 (65 FE) MG tablet Take 325 mg by mouth 3 (three) times a week.      glimepiride (AMARYL) 4 MG tablet Take 4 mg by mouth 2 (two) times daily.      metFORMIN (GLUCOPHAGE) 500 MG tablet Take 500 mg by mouth 2 (two) times daily with a meal.      methocarbamol (ROBAXIN) 500 MG tablet       Multiple Vitamins-Minerals (MULTIVITAMIN WITH MINERALS) tablet Take 0.5 tablets by mouth 2 (two) times daily.      Omega-3 Fatty Acids (FISH OIL) 1200 MG CAPS Take 1,200 mg by mouth daily.      omeprazole (PRILOSEC) 20 MG capsule Take 20 mg by mouth 2 (two) times daily.      pioglitazone (ACTOS) 30 MG tablet Take 30 mg by mouth daily.      ramipril (ALTACE) 10 MG capsule Hold while you are taking Bactrim.  Can resume afterwards.      sitaGLIPtin (JANUVIA) 100 MG tablet Take 100 mg by mouth daily.         Current Facility-Administered Medications:     stroke: mapping our early stages of recovery book, , Does not apply, Once, Rust-Chester, Britton L, NP   0.9 %  sodium chloride infusion, , Intravenous, PRN, Rust-Chester, Huel Cote, NP, Stopped at 03/01/21 [phone]   acetaminophen (TYLENOL) tablet 650 mg, 650 mg, Oral, Q4H PRN **OR** acetaminophen (TYLENOL) 160 MG/5ML solution 650 mg, 650 mg, Per Tube, Q4H PRN **OR** acetaminophen  (TYLENOL) suppository 650 mg, 650 mg, Rectal, Q4H PRN, Rust-Chester, Britton L, NP   atorvastatin (LIPITOR) tablet 40 mg, 40 mg, Oral, BID, Rust-Chester, Britton L, NP, 40 mg at 03/01/21 9629   Chlorhexidine Gluconate Cloth 2 % PADS 6 each, 6 each, Topical, Daily, Tyler Pita, MD, 6 each at 03/01/21 1624   insulin aspart (novoLOG) injection 0-15 Units, 0-15 Units, Subcutaneous, Q4H, Rust-Chester, Britton L, NP, 5 Units at 03/01/21 1624   labetalol (NORMODYNE) injection 10 mg, 10 mg, Intravenous, Q2H PRN, Rust-Chester, Toribio Harbour L, NP   lactated ringers infusion, , Intravenous, Continuous, Rust-Chester, Britton L, NP, Last Rate: 100 mL/hr at 03/01/21 1219, New Bag at 03/01/21 1219   metoprolol tartrate (LOPRESSOR) tablet 12.5 mg, 12.5 mg, Oral, BID, Vernard Gambles L, MD, 12.5 mg at 03/01/21 1623   pantoprazole (PROTONIX) injection 40 mg, 40 mg, Intravenous, Q12H, Rust-Chester, Britton L, NP, 40 mg at 03/01/21 0905   senna-docusate (Senokot-S) tablet 1 tablet, 1 tablet, Oral, QHS PRN, Rust-Chester, Huel Cote, NP  Vitals   Vitals:   03/01/21 1530 03/01/21 1600 03/01/21 1623 03/01/21 1700  BP: (!) 143/82 (!) 156/92  (!) 153/79  Pulse: 96 (!) 101 (!) 101 81  Resp: 20 (!) 22  (!) 25  Temp:      TempSrc:      SpO2: 93% 99%  99%  Weight:      Height:         Body mass index is 25.23 kg/m.  Physical Exam   Physical Exam Gen: A&O x4, NAD HEENT: Atraumatic, normocephalic;mucous membranes moist; oropharynx clear, tongue without atrophy or fasciculations. Neck: Supple, trachea midline. Resp: CTAB, no w/r/r CV: RRR, no m/g/r; nml S1 and S2. 2+ symmetric peripheral pulses. Abd: soft/NT/ND; nabs x 4 quad Extrem: Nml bulk; no cyanosis, clubbing, or edema.  Neuro: *MS: A&O x4. Follows multi-step commands.  *Speech: fluid, nondysarthric, able to  name and repeat *CN:    I: Deferred   II,III: PERRLA, VFF by confrontation, optic discs unable to be visualized 2/2 pupillary constriction    III,IV,VI: EOMI w/o nystagmus, no ptosis   V: Sensation intact from V1 to V3 to LT   VII: Eyelid closure was full.  Smile symmetric.   VIII: Hearing intact to voice   IX,X: Voice normal, palate elevates symmetrically    XI: SCM/trap 5/5 bilat   XII: Tongue protrudes midline, no atrophy or fasciculations   *Motor:   Normal bulk.  No tremor, rigidity or bradykinesia. No pronator drift.    Strength: Dlt Bic Tri WrE WrF FgS Gr HF KnF KnE PlF DoF    Left 5 5 5 5 5 5 5 5 5 5 5 5     Right 5 5 5 5 5 5 5 5 5 5 5 5     *Sensory: Intact to light touch, pinprick, temperature vibration throughout. Symmetric. Propioception intact bilat.  No double-simultaneous extinction.  *Coordination:  Finger-to-nose, heel-to-shin, rapid alternating motions were intact. *Reflexes:  2+ and symmetric throughout without clonus; toes down-going bilat *Gait: deferred  NIHSS = 0  Premorbid mRS = 0   Labs   CBC:  Recent Labs  Lab 02/28/21 2245 03/01/21 0445  WBC 8.5 10.7*  NEUTROABS 4.7  --   HGB 13.5 12.6*  HCT 38.9* 35.3*  MCV 88.2 86.5  PLT 225 277    Basic Metabolic Panel:  Lab Results  Component Value Date   NA 136 03/01/2021   K 4.0 03/01/2021   CO2 26 03/01/2021   GLUCOSE 99 03/01/2021   BUN 13 03/01/2021   CREATININE 0.62 03/01/2021   CALCIUM 8.8 (L) 03/01/2021   GFRNONAA >60 03/01/2021   Lipid Panel:  Lab Results  Component Value Date   LDLCALC 51 03/01/2021   HgbA1c:  Lab Results  Component Value Date   HGBA1C 8.1 (H) 05/27/2020   Urine Drug Screen:     Component Value Date/Time   LABOPIA NONE DETECTED 05/27/2020 1318   COCAINSCRNUR NONE DETECTED 05/27/2020 1318   LABBENZ NONE DETECTED 05/27/2020 1318   AMPHETMU NONE DETECTED 05/27/2020 1318   THCU NONE DETECTED 05/27/2020 1318   LABBARB NONE DETECTED 05/27/2020 1318    Alcohol Level No results found for: [place]   Impression   80 yo man with hx DM2, HTN, HL, prior MI, PVD, CAD who presented to ED with acute onset  expressive aphasia which resolved after administration of TNK. Given multiple cerebrovascular risk factors, suspect patient was having an acute ischemic stroke which was aborted by thrombolysis. Patient was not on a daily antiplatelet PTA (took ASA 81mg  qod, not daily "because too much aspirin isn't good).  Recommendations   - Manage Blood Pressure per post Thrombolytic protocol. Avoid oral antihypertensives - Neurochecks per post-thrombolytic protocol - MRI brain tonight 2300 (24 hrs post-TNK) - No antiplatelets or pharm DVT prophylaxis in first 24 hrs after TNK. Dr. Rory Percy will review MRI in the AM and make recommendations for antiplatelets if no hemorrhagic conversion - No Foley catheter, nasogastric tube, arterial catheter or central venous catheter for 24 hr, unless absolutely necessary - Telemetry - Continue atorvastatin 40mg  daily - q4 hr neuro checks - STAT head CT for any change in neuro exam - PT/OT/SLP - Stroke education - Amb referral to neurology upon discharge   Will continue to follow  ______________________________________________________________________   Thank you for the opportunity to take part in the care of this patient. If  you have any further questions, please contact the neurology consultation attending.  Signed,  Su Monks, MD [facility] [phone]  If 7pm- 7am, please page neurology on call as listed in [place].

## 2021-03-01 NOTE — Progress Notes (Signed)
*  PRELIMINARY RESULTS* Echocardiogram 2D Echocardiogram has been performed.  Claretta Fraise 03/01/2021, 10:28 AM

## 2021-03-02 LAB — CBC
HCT: 36.6 % — ABNORMAL LOW (ref 39.0–52.0)
Hemoglobin: 12.8 g/dL — ABNORMAL LOW (ref 13.0–17.0)
MCH: 30.3 pg (ref 26.0–34.0)
MCHC: 35 g/dL (ref 30.0–36.0)
MCV: 86.5 fL (ref 80.0–100.0)
Platelets: 210 10*3/uL (ref 150–400)
RBC: 4.23 MIL/uL (ref 4.22–5.81)
RDW: 12 % (ref 11.5–15.5)
WBC: 10.2 10*3/uL (ref 4.0–10.5)
nRBC: 0 % (ref 0.0–0.2)

## 2021-03-02 LAB — GLUCOSE, CAPILLARY
Glucose-Capillary: 115 mg/dL — ABNORMAL HIGH (ref 70–99)
Glucose-Capillary: 128 mg/dL — ABNORMAL HIGH (ref 70–99)
Glucose-Capillary: 130 mg/dL — ABNORMAL HIGH (ref 70–99)
Glucose-Capillary: 139 mg/dL — ABNORMAL HIGH (ref 70–99)
Glucose-Capillary: 90 mg/dL (ref 70–99)

## 2021-03-02 LAB — BASIC METABOLIC PANEL
Anion gap: 5 (ref 5–15)
BUN: 10 mg/dL (ref 8–23)
CO2: 29 mmol/L (ref 22–32)
Calcium: 8.4 mg/dL — ABNORMAL LOW (ref 8.9–10.3)
Chloride: 104 mmol/L (ref 98–111)
Creatinine, Ser: 0.81 mg/dL (ref 0.61–1.24)
GFR, Estimated: >60 mL/min (ref >60)
Glucose, Bld: 133 mg/dL — ABNORMAL HIGH (ref 70–99)
Potassium: 4.1 mmol/L (ref 3.5–5.1)
Sodium: 138 mmol/L (ref 135–145)

## 2021-03-02 LAB — MAGNESIUM: Magnesium: 1.4 mg/dL — ABNORMAL LOW (ref 1.7–2.4)

## 2021-03-02 LAB — PHOSPHORUS: Phosphorus: 3.6 mg/dL (ref 2.5–4.6)

## 2021-03-02 MED ORDER — CLOPIDOGREL BISULFATE 75 MG PO TABS: 75.0000 mg | ORAL_TABLET | Freq: Every day | ORAL | 0 refills | Status: AC

## 2021-03-02 MED ORDER — INSULIN ASPART 100 UNIT/ML IJ SOLN
0.0000 [IU] | Freq: Three times a day (TID) | INTRAMUSCULAR | Status: DC
Start: 2021-03-02 — End: 2021-03-02
  Administered 2021-03-02: 09:00:00 2 [IU] via SUBCUTANEOUS
  Filled 2021-03-02: qty 1

## 2021-03-02 MED ORDER — ASPIRIN 81 MG PO CHEW
81.0000 mg | CHEWABLE_TABLET | Freq: Every day | ORAL | Status: DC
  Administered 2021-03-02: 10:00:00 81 mg via ORAL
  Filled 2021-03-02: qty 1

## 2021-03-02 MED ORDER — CLOPIDOGREL BISULFATE 75 MG PO TABS
75.0000 mg | ORAL_TABLET | Freq: Every day | ORAL | Status: DC
  Administered 2021-03-02: 10:00:00 75 mg via ORAL
  Filled 2021-03-02: qty 1

## 2021-03-02 MED ORDER — MAGNESIUM SULFATE 2 GM/50ML IV SOLN
2.0000 g | Freq: Once | INTRAVENOUS | Status: AC
  Administered 2021-03-02: 09:00:00 2 g via INTRAVENOUS
  Filled 2021-03-02: qty 50

## 2021-03-02 NOTE — Progress Notes (Signed)
NEUROLOGY PROGRESS  NOTE   Date of service: March 02, 2021 Patient Name: Dustin Crane MRN:  [id number] DOB:  11-Oct-1940 Reason for consult: aphasia c/f acute ischemic stroke, s/p TNK Requesting physician: Dr. Vernard Gambles _ _ _   _ __   _ __ _ _  __ __   _ __   __ _  History of Present Illness   This is a 80 yo man with hx DM2, HTN, HL, prior MI, PVD, CAD who presented to ED with acute onset expressive aphasia. LKW 2110 on 12/24 (last night). Telestroke code activated on arrival, initial NIHSS = 4. CT head NAICP, ASPECTS 10. Patient was given TNK. CTA showed no LVO, CTP was negative. Within one hour of receiving TNK, aphasia resolved and NIHSS = 0. He was admitted to the ICU for post-thrombolytic protocol and stroke workup. Today patient reports his sx have completely resolved and he feels back to baseline. No new neurologic complaints today.  MRI brain -NEGATIVE for acute process  TTE 03/01/2021  1. Left ventricular ejection fraction, by estimation, is 45 to 50%. The  left ventricle has mildly decreased function. The left ventricle  demonstrates global hypokinesis. The left ventricular internal cavity size  was mildly dilated. Left ventricular  diastolic parameters are consistent with Grade I diastolic dysfunction  (impaired relaxation).   2. Right ventricular systolic function is low normal. The right  ventricular size is normal.   3. The mitral valve is normal in structure. Trivial mitral valve  regurgitation.   4. The aortic valve is normal in structure. Aortic valve regurgitation is  mild.   LA size normal and no IAS reported.  Prior 2D echocardiogram from October 2021 was mildly increased size of the left atrium, LV dysfunction with estimated EF 40 to 50%.  Stroke Labs     Component Value Date/Time   CHOL 101 03/01/2021 0445   TRIG 63 03/01/2021 0445   HDL 37 (L) 03/01/2021 0445   CHOLHDL 2.7 03/01/2021 0445   VLDL 13 03/01/2021 0445   LDLCALC 51 03/01/2021  0445    Lab Results  Component Value Date/Time   HGBA1C 8.1 (H) 05/27/2020 06:43 PM     ROS   Per HPI: all other systems reviewed and are negative  Past History   I have reviewed the following:  Past Medical History:  Diagnosis Date   Arthritis    Cancer ([place])    skin cancer with tags removed   Coronary artery disease    Diabetes mellitus without complication ([place])    GERD (gastroesophageal reflux disease)    Hyperlipemia    Hypertension    Myocardial infarction [facility]) 2004   2 stents   Peripheral vascular disease ([place])    TIA (transient ischemic attack) 10/2018   PATIENT DENIES THIS DIAGNOSIS   Wears glasses    Wears hearing aid    both ears   Past Surgical History:  Procedure Laterality Date   ACHILLES TENDON SURGERY Right 12/07/2018   Procedure: ACHILLES TENDON REPAIR SECONDARY;  Surgeon: Albertine Patricia, DPM;  Location: [place];  Service: Podiatry;  Laterality: Right;  LMA LOCAL DIABETIC   block   CALCANEAL OSTEOTOMY Right 12/07/2018   Procedure: PARTIAL CALCANECTOMY RIGHT;  Surgeon: Albertine Patricia, DPM;  Location: [place];  Service: Podiatry;  Laterality: Right;   CARDIAC CATHETERIZATION  2005   stents x2   CATARACT EXTRACTION W/PHACO Right 04/22/2020   Procedure: CATARACT EXTRACTION PHACO AND INTRAOCULAR LENS PLACEMENT (I[facility]) RIGHT  DIABETIC 3.65 00:41.9;  Surgeon: Birder Robson, MD;  Location: [place];  Service: Ophthalmology;  Laterality: Right;   CATARACT EXTRACTION W/PHACO Left 05/06/2020   Procedure: CATARACT EXTRACTION PHACO AND INTRAOCULAR LENS PLACEMENT ([place]) LEFT DIABETIC MYALUGIN;  Surgeon: Birder Robson, MD;  Location: [place];  Service: Ophthalmology;  Laterality: Left;  Dexycu 9%  LOT [id number]  EXP 10/05/2020 given @ 0757 inj OS  8.30 1:12.4   COLONOSCOPY     COLONOSCOPY WITH PROPOFOL N/A 04/03/2020   Procedure: COLONOSCOPY WITH PROPOFOL;  Surgeon: [name], MD;  Location: [facility] ENDOSCOPY;   Service: Gastroenterology;  Laterality: N/A;   CYSTOSCOPY WITH INSERTION OF UROLIFT N/A 05/19/2020   Procedure: CYSTOSCOPY WITH INSERTION OF UROLIFT;  Surgeon: Hollice Espy, MD;  Location: [facility];  Service: Urology;  Laterality: N/A;   FINGER ARTHROPLASTY Right 12/05/2012   Procedure: RIGHT INDEX FINGER IMPLANT ARTHROPLASTY;  Surgeon: Cammie Sickle., MD;  Location: [place];  Service: Orthopedics;  Laterality: Right;   HERNIA REPAIR  1990   rt ing    QUADRICEPS TENDON REPAIR Left 02/11/2017   Procedure: REPAIR QUADRICEP TENDON;  Surgeon: Leim Fabry, MD;  Location: [facility];  Service: Orthopedics;  Laterality: Left;   SHOULDER ACROMIOPLASTY  1967   [place]    SHOULDER ARTHROSCOPY  2012   right   TONSILLECTOMY     Family History  Problem Relation Age of Onset   Coronary artery disease Mother    Diabetes Mother    Alzheimer's disease Mother    Heart disease Father    Diabetes Father    Social History   Socioeconomic History   Marital status: Married    Spouse name: mary   Number of children: Not on file   Years of education: Not on file   Highest education level: Not on file  Occupational History   Occupation: MILITARY    Comment: RETIRED  Tobacco Use   Smoking status: Former    Types: Cigarettes    Quit date: 1978    Years since quitting: 45.0   Smokeless tobacco: Never  Vaping Use   Vaping Use: Never used  Substance and Sexual Activity   Alcohol use: Yes    Comment: occ   Drug use: No   Sexual activity: Not on file  Other Topics Concern   Not on file  Social History Narrative   Patient lives wife and a dog.   Feels safe in his home.   Starting to exercise since leg is beginning to heal.   Social Determinants of Health   Financial Resource Strain: Not on file  Food Insecurity: Not on file  Transportation Needs: Not on file  Physical Activity: Not on file  Stress: Not on file  Social Connections: Not on file  Social:  retired Contractor  Allergies  Allergen Reactions   Haemophilus Influenzae Anaphylaxis, Shortness Of Breath and Rash    Rash-severe n/v/d Ended up in hospital after taking!    Hemophilus B Polysaccharide Vaccine Anaphylaxis, Shortness Of Breath and Rash    Rash-severe n/v/d    Influenza Vac Split [Influenza Virus Vaccine] Shortness Of Breath and Other (See Comments)    Rash-severe n/v/d   Hydroxyzine     Patient denies?????   Compazine [Prochlorperazine Edisylate] Other (See Comments)    Muscle tightness    Medications   Medications Prior to Admission  Medication Sig Dispense Refill Last Dose   aspirin 81 MG tablet Take 81 mg  by mouth daily.      atorvastatin (LIPITOR) 40 MG tablet Take 40 mg by mouth in the morning and at bedtime.      cholecalciferol (VITAMIN D3) 25 MCG (1000 UNIT) tablet Take 1,000 Units by mouth daily.      ferrous sulfate 325 (65 FE) MG tablet Take 325 mg by mouth 3 (three) times a week.      glimepiride (AMARYL) 4 MG tablet Take 4 mg by mouth 2 (two) times daily.      metFORMIN (GLUCOPHAGE) 500 MG tablet Take 500 mg by mouth 2 (two) times daily with a meal.      methocarbamol (ROBAXIN) 500 MG tablet       Multiple Vitamins-Minerals (MULTIVITAMIN WITH MINERALS) tablet Take 0.5 tablets by mouth 2 (two) times daily.      Omega-3 Fatty Acids (FISH OIL) 1200 MG CAPS Take 1,200 mg by mouth daily.      omeprazole (PRILOSEC) 20 MG capsule Take 20 mg by mouth 2 (two) times daily.      pioglitazone (ACTOS) 30 MG tablet Take 30 mg by mouth daily.      ramipril (ALTACE) 10 MG capsule Hold while you are taking Bactrim.  Can resume afterwards.      sitaGLIPtin (JANUVIA) 100 MG tablet Take 100 mg by mouth daily.         Current Facility-Administered Medications:    0.9 %  sodium chloride infusion, , Intravenous, PRN, Rust-Chester, Huel Cote, NP, Stopped at 03/01/21 [phone]   acetaminophen (TYLENOL) tablet 650 mg, 650 mg, Oral, Q4H PRN **OR** acetaminophen (TYLENOL) 160  MG/5ML solution 650 mg, 650 mg, Per Tube, Q4H PRN **OR** acetaminophen (TYLENOL) suppository 650 mg, 650 mg, Rectal, Q4H PRN, Rust-Chester, Toribio Harbour L, NP   aspirin chewable tablet 81 mg, 81 mg, Oral, Daily, Amie Portland, MD   atorvastatin (LIPITOR) tablet 40 mg, 40 mg, Oral, BID, Rust-Chester, Huel Cote, NP, 40 mg at 03/01/21 2043   Chlorhexidine Gluconate Cloth 2 % PADS 6 each, 6 each, Topical, Daily, Tyler Pita, MD, 6 each at 03/01/21 1624   clopidogrel (PLAVIX) tablet 75 mg, 75 mg, Oral, Daily, Amie Portland, MD   insulin aspart (novoLOG) injection 0-15 Units, 0-15 Units, Subcutaneous, TID AC & HS, Rust-Chester, Britton L, NP   labetalol (NORMODYNE) injection 10 mg, 10 mg, Intravenous, Q2H PRN, Rust-Chester, Britton L, NP   metoprolol tartrate (LOPRESSOR) tablet 12.5 mg, 12.5 mg, Oral, BID, Vernard Gambles L, MD, 12.5 mg at 03/01/21 2043   pantoprazole (PROTONIX) injection 40 mg, 40 mg, Intravenous, Q12H, Rust-Chester, Britton L, NP, 40 mg at 03/01/21 2043   senna-docusate (Senokot-S) tablet 1 tablet, 1 tablet, Oral, QHS PRN, Rust-Chester, Huel Cote, NP  Vitals   Vitals:   03/02/21 0400 03/02/21 0535 03/02/21 0629 03/02/21 0803  BP:    (!) 150/73  Pulse:    68  Resp: 18 20 19 20   Temp:    98.4 F (36.9 C)  TempSrc:      SpO2:    96%  Weight:      Height:         Body mass index is 25.23 kg/m.  Physical Exam   Physical Exam Gen: A&O x4, NAD HEENT: Atraumatic, normocephalic;mucous membranes moist; oropharynx clear, tongue without atrophy or fasciculations. Neck: Supple, trachea midline. Resp: CTAB, no w/r/r CV: RRR, no m/g/r; nml S1 and S2. 2+ symmetric peripheral pulses. Abd: soft/NT/ND; nabs x 4 quad Extrem: Nml bulk; no cyanosis, clubbing, or edema.  Neuro: *MS:  A&O x4. Follows multi-step commands.  *Speech: fluid, nondysarthric, able to name and repeat *CN:    I: Deferred   II,III: PERRLA, VFF by confrontation, optic discs unable to be visualized 2/2  pupillary constriction   III,IV,VI: EOMI w/o nystagmus, no ptosis   V: Sensation intact from V1 to V3 to LT   VII: Eyelid closure was full.  Smile symmetric.   VIII: Hearing intact to voice   IX,X: Voice normal, palate elevates symmetrically    XI: SCM/trap 5/5 bilat   XII: Tongue protrudes midline, no atrophy or fasciculations   *Motor:   Normal bulk.  No tremor, rigidity or bradykinesia. No pronator drift.    Strength: Dlt Bic Tri WrE WrF FgS Gr HF KnF KnE PlF DoF    Left 5 5 5 5 5 5 5 5 5 5 5 5     Right 5 5 5 5 5 5 5 5 5 5 5 5     *Sensory: Intact to light touch, pinprick, temperature vibration throughout. Symmetric. Propioception intact bilat.  No double-simultaneous extinction.  *Coordination:  Finger-to-nose, heel-to-shin, rapid alternating motions were intact. *Reflexes:  2+ and symmetric throughout without clonus; toes down-going bilat *Gait: deferred  NIHSS = 0  Premorbid mRS = 1 (uses cane off and on due to knee surgery - otherwise no need for assistive device)  Exam back to baseline today.   Labs   CBC:  Recent Labs  Lab 02/28/21 2245 03/01/21 0445 03/02/21 0628  WBC 8.5 10.7* 10.2  NEUTROABS 4.7  --   --   HGB 13.5 12.6* 12.8*  HCT 38.9* 35.3* 36.6*  MCV 88.2 86.5 86.5  PLT 225 216 210     Basic Metabolic Panel:  Lab Results  Component Value Date   NA 138 03/02/2021   K 4.1 03/02/2021   CO2 29 03/02/2021   GLUCOSE 133 (H) 03/02/2021   BUN 10 03/02/2021   CREATININE 0.81 03/02/2021   CALCIUM 8.4 (L) 03/02/2021   GFRNONAA >60 03/02/2021   Lipid Panel:  Lab Results  Component Value Date   LDLCALC 51 03/01/2021   HgbA1c:  Lab Results  Component Value Date   HGBA1C 8.1 (H) 05/27/2020    Impression   80 yo man with hx DM2, HTN, HL, prior MI, PVD, CAD who presented to ED with acute onset expressive aphasia which resolved after administration of TNK. Given multiple cerebrovascular risk factors, suspect patient was having an acute ischemic  stroke which was aborted by thrombolysis. Patient was not on a daily antiplatelet PTA (took ASA 81mg  qod, not daily "because too much aspirin isn't good).  IMPRESSION Aborted stroke post IV thrombolysis vs TIA  Recommendations  - DAPT (ASA 81 daily +Plavix 75mg  daily) for 3 weeks followed by ASA only - Outpatient cardiac monitoring to look for evidence of paroxysmal A. fib-prior 2D echo had evidence of LA dilation, current 2D echo with normal LA size. -Outpatient cardiology follow-up in 4 to 6 weeks. - Telemetry - Continue atorvastatin 40mg  daily - was on at home. LDL at goal <70. - q4 hr neuro checks - STAT head CT for any change in neuro exam - PT/OT/SLP - should be OK to d/c from neurological standpoint after therapy evals. - Stroke education - Amb referral to [facility] neurology upon discharge in 4-6 weeks  Plan relayed to Dr. Kurtis Bushman via secure chat.   Patient's questions answered.    -- Amie Portland, MD Neurologist [facility] Pager: [phone]

## 2021-03-02 NOTE — Discharge Summary (Signed)
Dustin [facility] Crane KTG:[id number] DOB: [birth year] DOA: 02/28/2021  PCP: [facility], [facility]  Admit date: 02/28/2021 Discharge date: 03/02/2021  Admitted From: home Disposition:  home  Recommendations for Outpatient Follow-up:  Follow up with PCP in 1 week Please obtain BMP/CBC in one week Please follow up cardiology for monitor placement on 12/27 Follow up with neurology in 4 weeks      Discharge Condition:Stable CODE STATUS:full  Diet recommendation: Heart Healthy Brief/Interim Summary: Per HPI: 80 year old male presenting to [facility] from home on 02/28/2021 with complaints of acute aphasia.  Per wife's description she was with the patient on 02/28/21 and last known normal was about 9:10 PM.  He suddenly was unable to get his words out and having word finding difficulties.  No dysarthria, no blurred vision, falls or syncope, no weakness to the extremities and he denies pain anywhere such as a headache/neck or chest pain.  He acknowledged that he was unable to get some of his words out appropriately and reported frustration at this. ED course: Patient with acute aphasia and concern for acute ischemic stroke. EDP noted no strength or sensation deficits to the extremities, no other cranial nerve deficits were appreciated.  CT head without evidence of ICH or mass, no evidence of hypoglycemia.  Stat telemetry neuro consult obtained, initial NIHSS: 4, patient considered a candidate for TNKase which was initiated.   Ct head 1. Chronic ischemic microangiopathy without acute intracranial abnormality.2. ASPECTS is 10.  CTA H/N: IMPRESSION: 1. No emergent large vessel occlusion or high-grade stenosis of the intracranial arteries. 2. Bilateral proximal internal carotid artery stenosis, measuring 50% by NASCET criteria. 3. Normal CT perfusion.  Patient was admitted to ICU. MRI braine completed with no acute intracranial abnormality.Echo revealed EF of 45 to 50%.  Global hypokinesis.  Grade 1  diastolic dysfunction.  This a.m. PT OT evaluated patient and he had no needs. His stroke was aborted with IV thrombolysis, symptoms resolved. He was cleared by neurology this a.m. for discharge. Neurology recommended DAPT (Crane 81 daily +Plavix 75mg  daily) for 3 weeks followed by Crane only Spoke to [facility], he recommended patient to call cardiology office in a.m. to have his monitor placed  to look for evidence of paroxysmal atrial fibrillation. Goal LDL <70, to continue his statin. Follow up with neurology as outpatient.     Discharge Diagnoses:  Principal Problem:   Ischemic stroke [facility]) Active Problems:   Anemia, unspecified   Benign essential hypertension   CAD (coronary artery disease)   Diabetes mellitus with hyperglycemia ([facility])   Gastroesophageal reflux disease without esophagitis   Carotid stenosis   Seizure [facility])   Aphasia    Discharge Instructions  Discharge Instructions     Call [facility] for:  persistant dizziness or light-headedness   Complete by: As directed    Diet - low sodium heart healthy   Complete by: As directed    Discharge instructions   Complete by: As directed    Call cardiology tomorrow for monitor placement and follow up appointment with [facility] or [facility] neurology at [facility] in 4 weeks Take aspirin plus plavix x3 weeks, then after only aspirin daily   Increase activity slowly   Complete by: As directed       Allergies as of 03/02/2021       Reactions   Haemophilus Influenzae Anaphylaxis, Shortness Of Breath, Rash   Rash-severe n/v/d Ended up in hospital after taking!   Hemophilus B Polysaccharide Vaccine Anaphylaxis,  Shortness Of Breath, Rash   Rash-severe n/v/d   Influenza Vac Split [influenza Virus Vaccine] Shortness Of Breath, Other (See Comments)   Rash-severe n/v/d   Hydroxyzine    Patient denies?????   Compazine [prochlorperazine Edisylate] Other (See Comments)   Muscle tightness        Medication  List     TAKE these medications    aspirin 81 MG tablet Take 81 mg by mouth daily.   atorvastatin 40 MG tablet Commonly known as: LIPITOR Take 40 mg by mouth in the morning and at bedtime.   cholecalciferol 25 MCG (1000 UNIT) tablet Commonly known as: VITAMIN D3 Take 1,000 Units by mouth daily.   clopidogrel 75 MG tablet Commonly known as: PLAVIX Take 1 tablet (75 mg total) by mouth daily for 21 days. Start taking on: March 03, 2021   ferrous sulfate 325 (65 FE) MG tablet Take 325 mg by mouth 3 (three) times a week.   Fish Oil 1200 MG Caps Take 1,200 mg by mouth daily.   glimepiride 4 MG tablet Commonly known as: AMARYL Take 4 mg by mouth 2 (two) times daily.   metFORMIN 500 MG tablet Commonly known as: GLUCOPHAGE Take 500 mg by mouth 2 (two) times daily with a meal.   methocarbamol 500 MG tablet Commonly known as: ROBAXIN   multivitamin with minerals tablet Take 0.5 tablets by mouth 2 (two) times daily.   omeprazole 20 MG capsule Commonly known as: PRILOSEC Take 20 mg by mouth 2 (two) times daily.   pioglitazone 30 MG tablet Commonly known as: ACTOS Take 30 mg by mouth daily.   ramipril 10 MG capsule Commonly known as: ALTACE Hold while you are taking Bactrim.  Can resume afterwards.   sitaGLIPtin 100 MG tablet Commonly known as: JANUVIA Take 100 mg by mouth daily.        Follow-up Information     [facility], [facility] Follow up in 1 day(s).   Specialty: Cardiology Why: needs monitor placement for afib. pt had stroke. per Dr. Marianna Payment request to call for appointment on tuesday Contact information: [facility] [address] [phone]         [facility], [facility] Follow up in 4 week(s).   Specialty: Neurology Contact information: [facility] [place] [address] [phone]         [facility], [facility] Follow up in 1 week(s).    Specialty: Internal Medicine Contact information: [place] [address] [phone]                Allergies  Allergen Reactions   Haemophilus Influenzae Anaphylaxis, Shortness Of Breath and Rash    Rash-severe n/v/d Ended up in hospital after taking!    Hemophilus B Polysaccharide Vaccine Anaphylaxis, Shortness Of Breath and Rash    Rash-severe n/v/d    Influenza Vac Split [Influenza Virus Vaccine] Shortness Of Breath and Other (See Comments)    Rash-severe n/v/d   Hydroxyzine     Patient denies?????   Compazine [Prochlorperazine Edisylate] Other (See Comments)    Muscle tightness    Consultations: neurology   Procedures/Studies: MR BRAIN WO CONTRAST  Result Date: 03/01/2021 CLINICAL DATA:  Encephalopathy EXAM: MRI HEAD WITHOUT CONTRAST TECHNIQUE: Multiplanar, multiecho pulse sequences of the brain and surrounding structures were obtained without intravenous contrast. COMPARISON:  05/27/2020 FINDINGS: Brain: No acute infarct, mass effect or extra-axial collection. No acute or chronic  hemorrhage. There is multifocal hyperintense T2-weighted signal within the white matter. Generalized volume loss without a clear lobar predilection. Old right corona radiata small vessel infarcts and old left parietal infarct. The midline structures are normal. Vascular: Major flow voids are preserved. Skull and upper cervical spine: Normal calvarium and skull base. Visualized upper cervical spine and soft tissues are normal. Sinuses/Orbits:Right frontal sinus opacification.  Normal orbits. IMPRESSION: 1. No acute intracranial abnormality. 2. Findings of chronic ischemic microangiopathy and volume loss. Electronically Signed   By: Ulyses Jarred M.D.   On: 03/01/2021 23:59   ECHOCARDIOGRAM COMPLETE  Result Date: 03/01/2021    ECHOCARDIOGRAM REPORT   Patient Name:   Dustin Dustin Crane Date of Exam: 03/01/2021 Medical Rec #:  [id number]       Height:        69.0 in Accession #:    [id number]      Weight:       170.9 lb Date of Birth:  11-Mar-1940      BSA:          1.932 m Patient Age:    44 years        BP:           146/78 mmHg Patient Gender: M               HR:           89 bpm. Exam Location:  [facility] Procedure: 2D Echo Indications:     Stroke I63.9  History:         Patient has no prior history of Echocardiogram examinations.  Sonographer:     Kathlen Brunswick RDCS Referring Phys:  [id number] [place] Diagnosing Phys: Yolonda Kida [facility] IMPRESSIONS  1. Left ventricular ejection fraction, by estimation, is 45 to 50%. The left ventricle has mildly decreased function. The left ventricle demonstrates global hypokinesis. The left ventricular internal cavity size was mildly dilated. Left ventricular diastolic parameters are consistent with Grade I diastolic dysfunction (impaired relaxation).  2. Right ventricular systolic function is low normal. The right ventricular size is normal.  3. The mitral valve is normal in structure. Trivial mitral valve regurgitation.  4. The aortic valve is normal in structure. Aortic valve regurgitation is mild. FINDINGS  Left Ventricle: Left ventricular ejection fraction, by estimation, is 45 to 50%. The left ventricle has mildly decreased function. The left ventricle demonstrates global hypokinesis. The left ventricular internal cavity size was mildly dilated. There is  no left ventricular hypertrophy. Left ventricular diastolic parameters are consistent with Grade I diastolic dysfunction (impaired relaxation). Right Ventricle: The right ventricular size is normal. No increase in right ventricular wall thickness. Right ventricular systolic function is low normal. Left Atrium: Left atrial size was normal in size. Right Atrium: Right atrial size was normal in size. Pericardium: There is no evidence of pericardial effusion. Mitral Valve: The mitral valve is normal in structure. Trivial mitral valve regurgitation. Tricuspid Valve:  The tricuspid valve is normal in structure. Tricuspid valve regurgitation is trivial. Aortic Valve: The aortic valve is normal in structure. Aortic valve regurgitation is mild. Aortic regurgitation PHT measures 337 msec. Aortic valve peak gradient measures 7.6 mmHg. Pulmonic Valve: The pulmonic valve was normal in structure. Pulmonic valve regurgitation is not visualized. Aorta: The ascending aorta was not well visualized. IAS/Shunts: No atrial level shunt detected by color flow Doppler.  LEFT VENTRICLE PLAX 2D LVIDd:         4.90 cm  Diastology LVIDs:         3.80 cm      LV e' medial:    5.55 cm/s LV PW:         1.00 cm      LV E/e' medial:  11.5 LV IVS:        1.00 cm      LV e' lateral:   9.46 cm/s LVOT diam:     2.40 cm      LV E/e' lateral: 6.7 LV SV:         67 LV SV Index:   34 LVOT Area:     4.52 cm                              3D Volume EF: LV Volumes (MOD)            3D EF:        44 % LV vol d, MOD A2C: 71.4 ml  LV EDV:       182 ml LV vol d, MOD A4C: 115.0 ml LV ESV:       102 ml LV vol s, MOD A2C: 35.7 ml  LV SV:        80 ml LV vol s, MOD A4C: 57.2 ml LV SV MOD A2C:     35.7 ml LV SV MOD A4C:     115.0 ml LV SV MOD BP:      44.0 ml RIGHT VENTRICLE RV Basal diam:  2.80 cm RV S prime:     14.50 cm/s TAPSE (M-mode): 1.7 cm LEFT ATRIUM             Index        RIGHT ATRIUM          Index LA diam:        3.80 cm 1.97 cm/m   RA Area:     7.94 cm LA Vol (A2C):   36.2 ml 18.73 ml/m  RA Volume:   14.30 ml 7.40 ml/m LA Vol (A4C):   22.9 ml 11.85 ml/m LA Biplane Vol: 31.8 ml 16.46 ml/m  AORTIC VALVE                 PULMONIC VALVE AV Area (Vmax): 2.10 cm     PV Vmax:       1.13 m/s AV Vmax:        138.00 cm/s  PV Peak grad:  5.1 mmHg AV Peak Grad:   7.6 mmHg LVOT Vmax:      64.00 cm/s LVOT Vmean:     43.300 cm/s LVOT VTI:       0.147 m AI PHT:         337 msec  AORTA Ao Root diam: 3.30 cm Ao Asc diam:  3.50 cm MITRAL VALVE MV Area (PHT): 3.97 cm    SHUNTS MV Decel Time: 191 msec    Systemic VTI:   0.15 m MV E velocity: 63.80 cm/s  Systemic Diam: 2.40 cm MV A velocity: 75.00 cm/s MV E/A ratio:  0.85 [facility] Electronically signed by Yolonda Kida [facility] Signature Date/Time: 03/01/2021/11:28:40 AM    Final    CT HEAD CODE STROKE WO CONTRAST  Result Date: 02/28/2021 CLINICAL DATA:  Code stroke.  Aphasia EXAM: CT HEAD WITHOUT CONTRAST TECHNIQUE: Contiguous axial images were obtained from the base of the skull through the vertex without intravenous contrast. COMPARISON:  None.  FINDINGS: Brain: There is no mass, hemorrhage or extra-axial collection. The size and configuration of the ventricles and extra-axial CSF spaces are normal. There is hypoattenuation of the periventricular white matter, most commonly indicating chronic ischemic microangiopathy. Vascular: Atherosclerotic calcification of the internal carotid arteries at the skull base. No abnormal hyperdensity of the major intracranial arteries or dural venous sinuses. Skull: The visualized skull base, calvarium and extracranial soft tissues are normal. Sinuses/Orbits: No fluid levels or advanced mucosal thickening of the visualized paranasal sinuses. No mastoid or middle ear effusion. The orbits are normal. ASPECTS [facility] Stroke Program Early CT Score) - Ganglionic level infarction (caudate, lentiform nuclei, internal capsule, insula, M1-M3 cortex): 7 - Supraganglionic infarction (M4-M6 cortex): 3 Total score (0-10 with 10 being normal): 10 IMPRESSION: 1. Chronic ischemic microangiopathy without acute intracranial abnormality. 2. ASPECTS is 10. These results were called by telephone at the time of interpretation on 02/28/2021 at 10:31 pm to provider [facility] , who verbally acknowledged these results. Electronically Signed   By: Ulyses Jarred M.D.   On: 02/28/2021 22:31   CT ANGIO HEAD NECK W WO CM W PERF (CODE STROKE)  Addendum Date: 03/01/2021   ADDENDUM REPORT: 03/01/2021 00:47 ADDENDUM: Noncontrast head CT portion of the  examination shows no acute hemorrhage. ASPECTS is 10. Electronically Signed   By: Ulyses Jarred M.D.   On: 03/01/2021 00:47   Result Date: 03/01/2021 CLINICAL DATA:  Acute aphasia EXAM: CT ANGIOGRAPHY HEAD AND NECK CT PERFUSION BRAIN TECHNIQUE: Multidetector CT imaging of the head and neck was performed using the standard protocol during bolus administration of intravenous contrast. Multiplanar CT image reconstructions and MIPs were obtained to evaluate the vascular anatomy. Carotid stenosis measurements (when applicable) are obtained utilizing NASCET criteria, using the distal internal carotid diameter as the denominator. Multiphase CT imaging of the brain was performed following IV bolus contrast injection. Subsequent parametric perfusion maps were calculated using RAPID software. CONTRAST:  172mL OMNIPAQUE IOHEXOL 350 MG/ML SOLN COMPARISON:  Head CT 02/28/2021 FINDINGS: CTA NECK FINDINGS SKELETON: There is no bony spinal canal stenosis. No lytic or blastic lesion. OTHER NECK: Normal pharynx, larynx and major salivary glands. No cervical lymphadenopathy. Unremarkable thyroid gland. UPPER CHEST: No pneumothorax or pleural effusion. No nodules or masses. AORTIC ARCH: There is calcific atherosclerosis of the aortic arch. There is no aneurysm, dissection or hemodynamically significant stenosis of the visualized portion of the aorta. Conventional 3 vessel aortic branching pattern. The visualized proximal subclavian arteries are widely patent. RIGHT CAROTID SYSTEM: No dissection, occlusion or aneurysm. There is calcified atherosclerosis extending into the proximal ICA, resulting in 50% stenosis. LEFT CAROTID SYSTEM: No dissection, occlusion or aneurysm. There is calcified atherosclerosis extending into the proximal ICA, resulting in 50% stenosis. VERTEBRAL ARTERIES: Left dominant configuration. Both origins are clearly patent. There is no dissection, occlusion or flow-limiting stenosis to the skull base (V1-V3  segments). CTA HEAD FINDINGS POSTERIOR CIRCULATION: --Vertebral arteries: Normal V4 segments. --Inferior cerebellar arteries: Normal. --Basilar artery: Normal. --Superior cerebellar arteries: Normal. --Posterior cerebral arteries (PCA): Normal. ANTERIOR CIRCULATION: --Intracranial internal carotid arteries: Normal. --Anterior cerebral arteries (ACA): Normal. Both A1 segments are present. Patent anterior communicating artery (a-comm). --Middle cerebral arteries (MCA): Normal. VENOUS SINUSES: As permitted by contrast timing, patent. ANATOMIC VARIANTS: None Review of the MIP images confirms the above findings. CT Brain Perfusion Findings: ASPECTS: 10 CBF (<30%) Volume: 43mL Perfusion (Tmax>6.0s) volume: 58mL Mismatch Volume: 47mL Infarction Location:None IMPRESSION: 1. No emergent large vessel occlusion or high-grade stenosis of the intracranial arteries. 2.  Bilateral proximal internal carotid artery stenosis, measuring 50% by NASCET criteria. 3. Normal CT perfusion. Aortic Atherosclerosis (ICD10-I70.0). Electronically Signed: By: Ulyses Jarred M.D. On: 03/01/2021 00:22      Subjective: Ambulating in his room. No neurologic deficits reported. Has no complaints  Discharge Exam: Vitals:   03/02/21 0629 03/02/21 0803  BP:  (!) 150/73  Pulse:  68  Resp: 19 20  Temp:  98.4 F (36.9 C)  SpO2:  96%   Vitals:   03/02/21 0400 03/02/21 0535 03/02/21 0629 03/02/21 0803  BP:    (!) 150/73  Pulse:    68  Resp: 18 20 19 20   Temp:    98.4 F (36.9 C)  TempSrc:      SpO2:    96%  Weight:      Height:        General: Pt is alert, awake, not in acute distress Cardiovascular: RRR, S1/S2 +, no rubs, no gallops Respiratory: CTA bilaterally, no wheezing, no rhonchi Abdominal: Soft, NT, ND, bowel sounds + Extremities: no edema, no cyanosis    The results of significant diagnostics from this hospitalization (including imaging, microbiology, ancillary and laboratory) are listed below for reference.      Microbiology: Recent Results (from the past 240 hour(s))  Resp Panel by RT-PCR (Flu A&B, Covid) Nasopharyngeal Swab     Status: None   Collection Time: 03/01/21  1:27 AM   Specimen: Nasopharyngeal Swab; Nasopharyngeal(NP) swabs in vial transport medium  Result Value Ref Range Status   SARS Coronavirus 2 by RT PCR NEGATIVE NEGATIVE Final    Comment: (NOTE) SARS-CoV-2 target nucleic acids are NOT DETECTED.  The SARS-CoV-2 RNA is generally detectable in upper respiratory specimens during the acute phase of infection. The lowest concentration of SARS-CoV-2 viral copies this assay can detect is 138 copies/mL. A negative result does not preclude SARS-Cov-2 infection and should not be used as the sole basis for treatment or other patient management decisions. A negative result may occur with  improper specimen collection/handling, submission of specimen other than nasopharyngeal swab, presence of viral mutation(s) within the areas targeted by this assay, and inadequate number of viral copies(<138 copies/mL). A negative result must be combined with clinical observations, patient history, and epidemiological information. The expected result is Negative.  Fact Sheet for Patients:  EntrepreneurPulse.com.au  Fact Sheet for Healthcare Providers:  IncredibleEmployment.be  This test is no t yet approved or cleared by the [place] FDA and  has been authorized for detection and/or diagnosis of SARS-CoV-2 by FDA under an Emergency Use Authorization (EUA). This EUA will remain  in effect (meaning this test can be used) for the duration of the COVID-19 declaration under Section 564(b)(1) of the Act, 21 U.S.C.section 360bbb-3(b)(1), unless the authorization is terminated  or revoked sooner.       Influenza A by PCR NEGATIVE NEGATIVE Final   Influenza B by PCR NEGATIVE NEGATIVE Final    Comment: (NOTE) The Xpert Xpress SARS-CoV-2/FLU/RSV plus assay is  intended as an aid in the diagnosis of influenza from Nasopharyngeal swab specimens and should not be used as a sole basis for treatment. Nasal washings and aspirates are unacceptable for Xpert Xpress SARS-CoV-2/FLU/RSV testing.  Fact Sheet for Patients: EntrepreneurPulse.com.au  Fact Sheet for Healthcare Providers: IncredibleEmployment.be  This test is not yet approved or cleared by the [place] FDA and has been authorized for detection and/or diagnosis of SARS-CoV-2 by FDA under an Emergency Use Authorization (EUA). This EUA will remain in effect (meaning  this test can be used) for the duration of the COVID-19 declaration under Section 564(b)(1) of the Act, 21 U.S.C. section 360bbb-3(b)(1), unless the authorization is terminated or revoked.  Performed at [facility], [address] [place], [place] [address]   MRSA Next Gen by PCR, Nasal     Status: None   Collection Time: 03/01/21  2:31 AM   Specimen: Nasal Mucosa; Nasal Swab  Result Value Ref Range Status   MRSA by PCR Next Gen NOT DETECTED NOT DETECTED Final    Comment: (NOTE) The GeneXpert MRSA Assay (FDA approved for NASAL specimens only), is one component of a comprehensive MRSA colonization surveillance program. It is not intended to diagnose MRSA infection nor to guide or monitor treatment for MRSA infections. Test performance is not FDA approved in patients less than 57 years old. Performed at [facility], [address] [place], [place] [address]      Labs: BNP (last 3 results) No results for input(s): BNP in the last 8760 hours. Basic Metabolic Panel: Recent Labs  Lab 02/28/21 2245 03/01/21 0445 03/01/21 1146 03/02/21 0628  NA 139 136  --  138  K 4.1 4.0  --  4.1  CL 103 100  --  104  CO2 24 26  --  29  GLUCOSE 131* 99  --  133*  BUN 13 13  --  10  CREATININE 0.78 0.62  --  0.81  CALCIUM 8.8* 8.8*  --  8.4*  MG  --  0.8* 2.3 1.4*   PHOS  --  4.8*  --  3.6   Liver Function Tests: Recent Labs  Lab 02/28/21 2245  AST 21  ALT 21  ALKPHOS 63  BILITOT 0.8  PROT 7.5  ALBUMIN 4.2   No results for input(s): LIPASE, AMYLASE in the last 168 hours. No results for input(s): AMMONIA in the last 168 hours. CBC: Recent Labs  Lab 02/28/21 2245 03/01/21 0445 03/02/21 0628  WBC 8.5 10.7* 10.2  NEUTROABS 4.7  --   --   HGB 13.5 12.6* 12.8*  HCT 38.9* 35.3* 36.6*  MCV 88.2 86.5 86.5  PLT 225 216 210   Cardiac Enzymes: No results for input(s): CKTOTAL, CKMB, CKMBINDEX, TROPONINI in the last 168 hours. BNP: Invalid input(s): POCBNP CBG: Recent Labs  Lab 03/01/21 1519 03/01/21 2036 03/02/21 0012 03/02/21 0349 03/02/21 0817  GLUCAP 223* 128* 115* 139* 130*   D-Dimer No results for input(s): DDIMER in the last 72 hours. Hgb A1c No results for input(s): HGBA1C in the last 72 hours. Lipid Profile Recent Labs    03/01/21 0445  CHOL 101  HDL 37*  LDLCALC 51  TRIG 63  CHOLHDL 2.7   Thyroid function studies No results for input(s): TSH, T4TOTAL, T3FREE, THYROIDAB in the last 72 hours.  Invalid input(s): FREET3 Anemia work up No results for input(s): VITAMINB12, FOLATE, FERRITIN, TIBC, IRON, RETICCTPCT in the last 72 hours. Urinalysis    Component Value Date/Time   COLORURINE AMBER (A) 06/04/2020 1126   APPEARANCEUR Clear 11/11/2020 0956   LABSPEC 1.017 06/04/2020 1126   PHURINE 6.0 06/04/2020 1126   GLUCOSEU Negative 11/11/2020 0956   HGBUR NEGATIVE 06/04/2020 1126   BILIRUBINUR Negative 11/11/2020 0956   KETONESUR 5 (A) 06/04/2020 1126   PROTEINUR Negative 11/11/2020 0956   PROTEINUR 100 (A) 06/04/2020 1126   NITRITE Negative 11/11/2020 0956   NITRITE POSITIVE (A) 06/04/2020 1126   LEUKOCYTESUR Negative 11/11/2020 0956   LEUKOCYTESUR NEGATIVE 06/04/2020 1126  Sepsis Labs Invalid input(s): PROCALCITONIN,  WBC,  LACTICIDVEN Microbiology Recent Results (from the past 240 hour(s))  Resp Panel  by RT-PCR (Flu A&B, Covid) Nasopharyngeal Swab     Status: None   Collection Time: 03/01/21  1:27 AM   Specimen: Nasopharyngeal Swab; Nasopharyngeal(NP) swabs in vial transport medium  Result Value Ref Range Status   SARS Coronavirus 2 by RT PCR NEGATIVE NEGATIVE Final    Comment: (NOTE) SARS-CoV-2 target nucleic acids are NOT DETECTED.  The SARS-CoV-2 RNA is generally detectable in upper respiratory specimens during the acute phase of infection. The lowest concentration of SARS-CoV-2 viral copies this assay can detect is 138 copies/mL. A negative result does not preclude SARS-Cov-2 infection and should not be used as the sole basis for treatment or other patient management decisions. A negative result may occur with  improper specimen collection/handling, submission of specimen other than nasopharyngeal swab, presence of viral mutation(s) within the areas targeted by this assay, and inadequate number of viral copies(<138 copies/mL). A negative result must be combined with clinical observations, patient history, and epidemiological information. The expected result is Negative.  Fact Sheet for Patients:  EntrepreneurPulse.com.au  Fact Sheet for Healthcare Providers:  IncredibleEmployment.be  This test is no t yet approved or cleared by the [place] FDA and  has been authorized for detection and/or diagnosis of SARS-CoV-2 by FDA under an Emergency Use Authorization (EUA). This EUA will remain  in effect (meaning this test can be used) for the duration of the COVID-19 declaration under Section 564(b)(1) of the Act, 21 U.S.C.section 360bbb-3(b)(1), unless the authorization is terminated  or revoked sooner.       Influenza A by PCR NEGATIVE NEGATIVE Final   Influenza B by PCR NEGATIVE NEGATIVE Final    Comment: (NOTE) The Xpert Xpress SARS-CoV-2/FLU/RSV plus assay is intended as an aid in the diagnosis of influenza from Nasopharyngeal swab  specimens and should not be used as a sole basis for treatment. Nasal washings and aspirates are unacceptable for Xpert Xpress SARS-CoV-2/FLU/RSV testing.  Fact Sheet for Patients: EntrepreneurPulse.com.au  Fact Sheet for Healthcare Providers: IncredibleEmployment.be  This test is not yet approved or cleared by the [place] FDA and has been authorized for detection and/or diagnosis of SARS-CoV-2 by FDA under an Emergency Use Authorization (EUA). This EUA will remain in effect (meaning this test can be used) for the duration of the COVID-19 declaration under Section 564(b)(1) of the Act, 21 U.S.C. section 360bbb-3(b)(1), unless the authorization is terminated or revoked.  Performed at [facility], [address] [place], [place] [address]   MRSA Next Gen by PCR, Nasal     Status: None   Collection Time: 03/01/21  2:31 AM   Specimen: Nasal Mucosa; Nasal Swab  Result Value Ref Range Status   MRSA by PCR Next Gen NOT DETECTED NOT DETECTED Final    Comment: (NOTE) The GeneXpert MRSA Assay (FDA approved for NASAL specimens only), is one component of a comprehensive MRSA colonization surveillance program. It is not intended to diagnose MRSA infection nor to guide or monitor treatment for MRSA infections. Test performance is not FDA approved in patients less than 68 years old. Performed at [facility], [address], [place], [place] [address]      Time coordinating discharge: Over 30 minutes  SIGNED:   Nolberto Hanlon, Dustin Crane  [facility] 03/02/2021, 9:58 AM Pager   If 7PM-7AM, please contact night-coverage [email or web address] Password [email or web address]

## 2021-03-02 NOTE — Evaluation (Signed)
Speech Language Pathology Evaluation Patient Details Name: Dustin Crane MRN: [id number] DOB: 1940/06/05 Today's Date: 03/02/2021 Time: 1020-1045 SLP Time Calculation (min) (ACUTE ONLY): 25 min  Problem List:  Patient Active Problem List   Diagnosis Date Noted   Ischemic stroke ([place]) 03/01/2021   Aphasia 02/28/2021   Chronic ischemic heart disease 02/28/2021   Sepsis ([place]) 19/37/9024   Acute metabolic encephalopathy 09/73/5329   Sepsis secondary to UTI ([place]) 05/27/2020   Delirium 05/27/2020   Seizure ([place]) 05/27/2020   Hypokalemia 05/27/2020   Hypomagnesemia 05/27/2020   Anemia, unspecified 11/27/2018   CAD (coronary artery disease) 11/27/2018   Complete rupture of rotator cuff 11/27/2018   Disorder of bursae and tendons in shoulder region 11/27/2018   Neck pain 11/27/2018   Osteoarthritis 11/27/2018   Sinoatrial node dysfunction ([place]) 11/27/2018   Carotid stenosis 11/27/2018   Other chest pain 10/26/2017   Acute pain of left knee 04/21/2017   Gastroesophageal reflux disease without esophagitis 05/06/2016   Diabetes mellitus with hyperglycemia ([place]) 11/03/2015   Mild mitral insufficiency 04/09/2015   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   Benign essential hypertension 07/26/2014   Mixed hyperlipidemia 08/09/2013   Past Medical History:  Past Medical History:  Diagnosis Date   Arthritis    Cancer ([place])    skin cancer with tags removed   Coronary artery disease    Diabetes mellitus without complication ([place])    GERD (gastroesophageal reflux disease)    Hyperlipemia    Hypertension    Myocardial infarction [facility]) 2004   2 stents   Peripheral vascular disease ([place])    TIA (transient ischemic attack) 10/2018   PATIENT DENIES THIS DIAGNOSIS   Wears glasses    Wears hearing aid    both ears   Past Surgical History:  Past Surgical History:  Procedure Laterality Date   ACHILLES TENDON SURGERY Right 12/07/2018   Procedure: ACHILLES TENDON REPAIR SECONDARY;  Surgeon: Albertine Patricia, DPM;  Location: [place];  Service: Podiatry;  Laterality: Right;  LMA LOCAL DIABETIC   block   CALCANEAL OSTEOTOMY Right 12/07/2018   Procedure: PARTIAL CALCANECTOMY RIGHT;  Surgeon: Albertine Patricia, DPM;  Location: [place];  Service: Podiatry;  Laterality: Right;   CARDIAC CATHETERIZATION  2005   stents x2   CATARACT EXTRACTION W/PHACO Right 04/22/2020   Procedure: CATARACT EXTRACTION PHACO AND INTRAOCULAR LENS PLACEMENT (IOC) RIGHT DIABETIC 3.65 00:41.9;  Surgeon: Birder Robson, MD;  Location: [place];  Service: Ophthalmology;  Laterality: Right;   CATARACT EXTRACTION W/PHACO Left 05/06/2020   Procedure: CATARACT EXTRACTION PHACO AND INTRAOCULAR LENS PLACEMENT ([place]) LEFT DIABETIC MYALUGIN;  Surgeon: Birder Robson, MD;  Location: [place];  Service: Ophthalmology;  Laterality: Left;  Dexycu 9%  LOT [id number]  EXP 10/05/2020 given @ 0757 inj OS  8.30 1:12.4   COLONOSCOPY     COLONOSCOPY WITH PROPOFOL N/A 04/03/2020   Procedure: COLONOSCOPY WITH PROPOFOL;  Surgeon: [name], MD;  Location: [facility] ENDOSCOPY;  Service: Gastroenterology;  Laterality: N/A;   CYSTOSCOPY WITH INSERTION OF UROLIFT N/A 05/19/2020   Procedure: CYSTOSCOPY WITH INSERTION OF UROLIFT;  Surgeon: Hollice Espy, MD;  Location: [facility] ORS;  Service: Urology;  Laterality: N/A;   FINGER ARTHROPLASTY Right 12/05/2012   Procedure: RIGHT INDEX FINGER IMPLANT ARTHROPLASTY;  Surgeon: Cammie Sickle., MD;  Location: [place];  Service: Orthopedics;  Laterality: Right;   HERNIA REPAIR  1990   rt ing    QUADRICEPS TENDON REPAIR Left 02/11/2017   Procedure: REPAIR  QUADRICEP TENDON;  Surgeon: Leim Fabry, MD;  Location: [facility];  Service: Orthopedics;  Laterality: Left;   SHOULDER ACROMIOPLASTY  1967   [place]    SHOULDER ARTHROSCOPY  2012   right   TONSILLECTOMY     HPI:  Per 57 H&P "80 year old male presenting to [facility] ED from home  on 02/28/2021 with complaints of acute aphasia.  Per wife's description she was with the patient on 02/28/21 and last known normal was about 9:10 PM.  He suddenly was unable to get his words out and having word finding difficulties.  No dysarthria, no blurred vision, falls or syncope, no weakness to the extremities and he denies pain anywhere such as a headache/neck or chest pain.  He acknowledged that he was unable to get some of his words out appropriately and reported frustration at this.  ED course:  Patient with acute aphasia and concern for acute ischemic stroke. EDP noted no strength or sensation deficits to the extremities, no other cranial nerve deficits were appreciated.  CT head without evidence of ICH or mass, no evidence of hypoglycemia.  Stat telemetry neuro consult obtained, initial NIHSS: 4, patient considered a candidate for TNKase which was initiated.   Medications given: TNKase 20 mg, IV contrast, Zofran and Protonix  Initial Vitals: Afebrile at 98.3, RR 18, HR 99, BP 172/93 and SPO2 95% on room air  Significant labs: (Labs/ Imaging personally reviewed)  I, Domingo Pulse [place], AGACNP-BC, personally viewed and interpreted this ECG.  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 ASPECTS 10  CTangio head/neck/perfusion 02/28/2021: No emergent LVO or high-grade stenosis of the intracranial arteries.  Bilateral proximal internal carotid artery stenosis measuring 50% by NASCET criteria.  Normal CT perfusion"   Assessment / Plan / Recommendation Clinical Impression  Pt seen for speech/language evaluation. Evaluation completed via informal means. Pt demonstrated functional primary language skills. Pt's speech  is fluent, appropriate, and without s/sx anomia or dysarthria. Pt wears B Hearing Aids which were not present/donned initially. Pt benefited from increased local loudness and extra time for auditory comprehension when not wearing hearing aids. However, no difficulty with auditory comprehension noted with hearing aids donned.  Pt endorses resolution of speech/language difficulty and notes he is at baseline. Wife present at end of session and agrees.   SLP to sign off as pt has no acute SLP needs at this time. Pt, pt's wife, and RN made aware of results, recommendations, and SLP POC. Pt and wife verbalize understanding/agreement.    SLP Assessment  SLP Recommendation/Assessment: Patient does not need any further Speech [place] [facility] SLP Visit Diagnosis: Aphasia (R47.01)    Recommendations for follow up therapy are one component of a multi-disciplinary discharge planning process, led by the attending physician.  Recommendations may be updated based on patient status, additional functional criteria and insurance authorization.    Follow Up Recommendations  No SLP follow up    Assistance Recommended at Discharge   (initial supervision/assistance as needed at d/c)  Functional Status Assessment Patient has not had a recent decline in their functional status        SLP Evaluation Cognition  Overall Cognitive Status: Within Functional Limits for tasks assessed Arousal/Alertness: Awake/alert Orientation Level: Oriented X4 Awareness: Appears  intact Problem Solving: Appears intact Safety/Judgment: Appears intact       Comprehension  Auditory Comprehension Overall Auditory Comprehension: Appears within functional limits for tasks assessed Yes/No Questions: Within Functional Limits Commands: Within Functional Limits Conversation: Complex [facility]) Interfering Components: Hearing (wears B HAs; not initially donned) EffectiveTechniques: Increased volume;Extra processing time (B  HAs) Visual Recognition/Discrimination Discrimination: Within Function Limits Reading Comprehension Reading Status: Within funtional limits    Expression Expression Primary Mode of Expression: Verbal Verbal Expression Overall Verbal Expression: Appears within functional limits for tasks assessed Initiation: No impairment Repetition: No impairment Naming: No impairment Written Expression Dominant Hand: Right Written Expression: Not tested   Oral / Motor  Oral Motor/Sensory Function Overall Oral Motor/Sensory Function: Within functional limits Motor Speech Overall Motor Speech: Appears within functional limits for tasks assessed Respiration: Within functional limits Phonation: Normal Resonance: Within functional limits Articulation: Within functional limitis Intelligibility: Intelligible Motor Planning: Witnin functional limits Motor Speech Errors: Not applicable            Cherrie Gauze, M.S., [facility] [phone] ([place])  Quintella Baton 03/02/2021, 10:58 AM

## 2021-03-02 NOTE — TOC CM/SW Note (Signed)
Patient has orders to discharge home today. Chart reviewed. PCP is Fulton Reek, MD. On room air. No discharge wound care needs. No PT/OT follow up needs. No TOC needs identified. CSW signing off.  Dayton Scrape, [place]

## 2021-03-02 NOTE — Progress Notes (Signed)
OT Cancellation Note  Patient Details Name: Dustin Crane MRN: [id number] DOB: 07/18/1940   Cancelled Treatment:    Reason Eval/Treat Not Completed: OT screened, no needs identified, will sign off. Per PT, pt is independent in all aspects of care and at baseline level. OT to SIGN OFF.  Darleen Crocker, MS, OTR/L , CBIS ascom [phone]  03/02/21, 9:12 AM

## 2021-03-02 NOTE — Evaluation (Signed)
Physical Therapy Evaluation Patient Details Name: Dustin Crane MRN: [id number] DOB: 1940-04-10 Today's Date: 03/02/2021  History of Present Illness  Patient is a 80 yo man with medical history significant for  DM2, HTN, HL, MI, PVD, CAD who presented to ED with acute onset expressive aphasia which resolved after administration of TNK.  Clinical Impression  PT evaluation completed. Patient is alert and oriented, able to follow all commands without difficulty. No difficulty with word finding noted during assessment. The patient is independent or Modified independent with all activity including walking in hallway. He appears to be at his baseline level of functional mobility with no acute PT needs identified at this time.      Recommendations for follow up therapy are one component of a multi-disciplinary discharge planning process, led by the attending physician.  Recommendations may be updated based on patient status, additional functional criteria and insurance authorization.  Follow Up Recommendations No PT follow up    Assistance Recommended at Discharge None  Functional Status Assessment Patient has not had a recent decline in their functional status  Equipment Recommendations  None recommended by PT    Recommendations for Other Services       Precautions / Restrictions Precautions Precautions: None Restrictions Weight Bearing Restrictions: No      Mobility  Bed Mobility Overal bed mobility: Independent                  Transfers Overall transfer level: Independent                      Ambulation/Gait Ambulation/Gait assistance: Modified independent (Device/Increase time) Gait Distance (Feet): 200 Feet Assistive device: IV Pole (intermittently pushing IV pole but this was not required for balance or for steadying with ambulation) Gait Pattern/deviations: Step-through pattern Gait velocity: normal     General Gait Details: no loss of balance with  ambulation. steady with changes in gait speed and making turns  Financial trader Rankin (Stroke Patients Only)       Balance Overall balance assessment: Independent                                           Pertinent Vitals/Pain Pain Assessment: No/denies pain    Home Living Family/patient expects to be discharged to:: Private residence Living Arrangements: Spouse/significant other Available Help at Discharge: Family Type of Home: House Home Access: Stairs to enter   Technical brewer of Steps: 3-4   Home Layout: Two level;Able to live on main level with bedroom/bathroom        Prior Function Prior Level of Function : Independent/Modified Independent                     Hand Dominance   Dominant Hand: Right    Extremity/Trunk Assessment   Upper Extremity Assessment Upper Extremity Assessment: LUE deficits/detail (RUE shoulder flexion/elbow flexion/extension 5/5) LUE Deficits / Details: thumb surgery about 6 months ago (chronic ROM issues and and has completed therapy already). shoulder flexion/elbow flexion/extension 5/5 LUE Sensation:  (chronic numbness around thumb. otherwise [facility])    Lower Extremity Assessment Lower Extremity Assessment: Overall WFL for tasks assessed       Communication   Communication: No difficulties  Cognition Arousal/Alertness: Awake/alert Behavior During Therapy: WFL for tasks  assessed/performed Overall Cognitive Status: Within Functional Limits for tasks assessed                                 General Comments: patient is alert, oriented, and able to follow all commands without difficulty        General Comments      Exercises     Assessment/Plan    PT Assessment Patient does not need any further PT services  PT Problem List         PT Treatment Interventions      PT Goals (Current goals can be found in the Care Plan section)        Frequency     Barriers to discharge        Co-evaluation               AM-PAC PT "6 Clicks" Mobility  Outcome Measure Help needed turning from your back to your side while in a flat bed without using bedrails?: None Help needed moving from lying on your back to sitting on the side of a flat bed without using bedrails?: None Help needed moving to and from a bed to a chair (including a wheelchair)?: None Help needed standing up from a chair using your arms (e.g., wheelchair or bedside chair)?: None Help needed to walk in hospital room?: None Help needed climbing 3-5 steps with a railing? : None 6 Click Score: 24    End of Session   Activity Tolerance: Patient tolerated treatment well Patient left:  (in bathroom per his request) Nurse Communication: Mobility status PT Visit Diagnosis: Other symptoms and signs involving the nervous system (R29.898)    Time: 5093-2671 PT Time Calculation (min) (ACUTE ONLY): 14 min   Charges:   PT Evaluation $PT Eval Low Complexity: 1 Low         Minna Merritts, PT, MPT   Percell Locus 03/02/2021, 9:19 AM

## 2021-03-03 LAB — HEMOGLOBIN A1C
Hgb A1c MFr Bld: 7.1 % — ABNORMAL HIGH (ref 4.8–5.6)
Mean Plasma Glucose: 157 mg/dL

## 2021-03-13 ENCOUNTER — Telehealth (INDEPENDENT_AMBULATORY_CARE_PROVIDER_SITE_OTHER): Payer: Self-pay

## 2021-03-13 NOTE — Telephone Encounter (Signed)
Patient spouse left a message informing her husband was hospitalized on 02/28/21 and informed he has arteriogram. I spoke with the patient spouse and made her aware that I saw he had a echocardiogram but was not able see the scan she was speaking of. I recommended patient to keep his upcoming appointment schedule for 06/2021.

## 2021-06-08 ENCOUNTER — Other Ambulatory Visit (INDEPENDENT_AMBULATORY_CARE_PROVIDER_SITE_OTHER): Payer: Self-pay | Admitting: Vascular Surgery

## 2021-06-08 ENCOUNTER — Ambulatory Visit (INDEPENDENT_AMBULATORY_CARE_PROVIDER_SITE_OTHER): Payer: Medicare Other | Admitting: Vascular Surgery

## 2021-06-08 ENCOUNTER — Ambulatory Visit (INDEPENDENT_AMBULATORY_CARE_PROVIDER_SITE_OTHER): Payer: Medicare Other

## 2021-06-08 DIAGNOSIS — I679 Cerebrovascular disease, unspecified: Secondary | ICD-10-CM

## 2021-06-09 ENCOUNTER — Other Ambulatory Visit: Payer: Self-pay

## 2021-06-09 NOTE — Patient Outreach (Signed)
[place] [facility]) Care Management ? ?06/09/2021 ? ?Dustin Crane ?1940-12-08 ?[id number] ? ? ?First telephone outreach attempt to obtain mRS. No answer. Left message for returned call. ? ?[name] ?[facility]-Care Management Assistant ?[phone] ? ?

## 2021-06-11 ENCOUNTER — Other Ambulatory Visit: Payer: Self-pay

## 2021-06-11 NOTE — Patient Outreach (Signed)
[place] [facility]) Care Management ? ?06/11/2021 ? ?Dustin Crane ?19-Apr-1940 ?[id number] ? ? ?Second telephone outreach attempt to obtain mRS. No answer. Left message for returned call. ? ?[name] ?THN-Care Management Assistant ?[phone] ? ?

## 2021-06-15 ENCOUNTER — Other Ambulatory Visit: Payer: Self-pay

## 2021-06-15 NOTE — Patient Outreach (Signed)
[place] [facility]) Care Management ? ?06/15/2021 ? ?EWEL LONA ?05/31/1940 ?[id number] ? ? ?3 outreach attempts were completed to obtain mRs. mRs could not be obtained because patient never returned my calls. mRs=7 ?  ? ?[name] ?Care Management Assistant ?[phone] ? ? ?

## 2021-06-16 ENCOUNTER — Other Ambulatory Visit: Payer: Self-pay

## 2021-06-16 NOTE — Patient Outreach (Signed)
[place] [facility]) Care Management ? ?06/16/2021 ? ?Dustin Crane ?1940/11/25 ?[id number] ? ? ?Telephone outreach to patient to obtain mRS was successfully completed. Patient's wife called back, MRS=0 ? ?Dustin Crane ?[place] Management Assistant ?[phone] ? ?

## 2021-06-17 ENCOUNTER — Encounter (INDEPENDENT_AMBULATORY_CARE_PROVIDER_SITE_OTHER): Payer: Self-pay | Admitting: *Deleted

## 2021-07-25 ENCOUNTER — Emergency Department
Admission: EM | Admit: 2021-07-25 | Discharge: 2021-07-25 | Disposition: A | Discharge status: Home / Self Care | Payer: Medicare Other | Attending: Emergency Medicine | Admitting: Emergency Medicine

## 2021-07-25 ENCOUNTER — Emergency Department: Payer: Medicare Other

## 2021-07-25 DIAGNOSIS — R4781 Slurred speech: Secondary | ICD-10-CM | POA: Diagnosis not present

## 2021-07-25 DIAGNOSIS — R4182 Altered mental status, unspecified: Secondary | ICD-10-CM | POA: Insufficient documentation

## 2021-07-25 DIAGNOSIS — E119 Type 2 diabetes mellitus without complications: Secondary | ICD-10-CM | POA: Insufficient documentation

## 2021-07-25 DIAGNOSIS — I10 Essential (primary) hypertension: Secondary | ICD-10-CM | POA: Insufficient documentation

## 2021-07-25 DIAGNOSIS — I2510 Atherosclerotic heart disease of native coronary artery without angina pectoris: Secondary | ICD-10-CM | POA: Insufficient documentation

## 2021-07-25 DIAGNOSIS — R4701 Aphasia: Secondary | ICD-10-CM

## 2021-07-25 DIAGNOSIS — R569 Unspecified convulsions: Secondary | ICD-10-CM | POA: Diagnosis not present

## 2021-07-25 DIAGNOSIS — Z85828 Personal history of other malignant neoplasm of skin: Secondary | ICD-10-CM | POA: Insufficient documentation

## 2021-07-25 LAB — TROPONIN I (HIGH SENSITIVITY)
Troponin I (High Sensitivity): 11 ng/L (ref <18)
Troponin I (High Sensitivity): 11 ng/L (ref <18)

## 2021-07-25 LAB — COMPREHENSIVE METABOLIC PANEL
ALT: 18 U/L (ref 0–44)
AST: 21 U/L (ref 15–41)
Albumin: 4 g/dL (ref 3.5–5.0)
Alkaline Phosphatase: 63 U/L (ref 38–126)
Anion gap: 9 (ref 5–15)
BUN: 18 mg/dL (ref 8–23)
CO2: 27 mmol/L (ref 22–32)
Calcium: 8.4 mg/dL — ABNORMAL LOW (ref 8.9–10.3)
Chloride: 101 mmol/L (ref 98–111)
Creatinine, Ser: 0.95 mg/dL (ref 0.61–1.24)
GFR, Estimated: >60 mL/min (ref >60)
Glucose, Bld: 249 mg/dL — ABNORMAL HIGH (ref 70–99)
Potassium: 4.1 mmol/L (ref 3.5–5.1)
Sodium: 137 mmol/L (ref 135–145)
Total Bilirubin: 0.9 mg/dL (ref 0.3–1.2)
Total Protein: 7.2 g/dL (ref 6.5–8.1)

## 2021-07-25 LAB — DIFFERENTIAL
Abs Immature Granulocytes: 0.09 10*3/uL — ABNORMAL HIGH (ref 0.00–0.07)
Basophils Absolute: 0 10*3/uL (ref 0.0–0.1)
Basophils Relative: 0 %
Eosinophils Absolute: 0.1 10*3/uL (ref 0.0–0.5)
Eosinophils Relative: 1 %
Immature Granulocytes: 1 %
Lymphocytes Relative: 23 %
Lymphs Abs: 2.1 10*3/uL (ref 0.7–4.0)
Monocytes Absolute: 0.6 10*3/uL (ref 0.1–1.0)
Monocytes Relative: 6 %
Neutro Abs: 6.1 10*3/uL (ref 1.7–7.7)
Neutrophils Relative %: 69 %

## 2021-07-25 LAB — CBC
HCT: 38.3 % — ABNORMAL LOW (ref 39.0–52.0)
Hemoglobin: 12.9 g/dL — ABNORMAL LOW (ref 13.0–17.0)
MCH: 30.3 pg (ref 26.0–34.0)
MCHC: 33.7 g/dL (ref 30.0–36.0)
MCV: 89.9 fL (ref 80.0–100.0)
Platelets: 222 10*3/uL (ref 150–400)
RBC: 4.26 MIL/uL (ref 4.22–5.81)
RDW: 12 % (ref 11.5–15.5)
WBC: 9 10*3/uL (ref 4.0–10.5)
nRBC: 0 % (ref 0.0–0.2)

## 2021-07-25 LAB — CBG MONITORING, ED: Glucose-Capillary: 246 mg/dL — ABNORMAL HIGH (ref 70–99)

## 2021-07-25 LAB — APTT: aPTT: 39 seconds — ABNORMAL HIGH (ref 24–36)

## 2021-07-25 LAB — PROTIME-INR
INR: 1.1 (ref 0.8–1.2)
Prothrombin Time: 13.7 seconds (ref 11.4–15.2)

## 2021-07-25 MED ORDER — LEVETIRACETAM 500 MG PO TABS: 500.0000 mg | ORAL_TABLET | Freq: Two times a day (BID) | ORAL | 0 refills | Status: DC

## 2021-07-25 MED ORDER — LEVETIRACETAM 500 MG PO TABS
1000.0000 mg | ORAL_TABLET | Freq: Once | ORAL | Status: AC
  Administered 2021-07-25: 1000 mg via ORAL
  Filled 2021-07-25: qty 2

## 2021-07-25 MED ORDER — SODIUM CHLORIDE 0.9% FLUSH
3.0000 mL | Freq: Once | INTRAVENOUS | Status: AC
  Administered 2021-07-25: 3 mL via INTRAVENOUS

## 2021-07-25 NOTE — Progress Notes (Signed)
Chaplain responded to code stroke. Pt. was alert and wife was present, both were receptive and grateful for Chaplains' visit. Chaplain offered compassionate presence and encouraged pt. and wife to contact nurse for a follow-up from the [place], if needed.

## 2021-07-25 NOTE — ED Notes (Signed)
Teleneuro turned on

## 2021-07-25 NOTE — Consult Note (Signed)
Dr [name] at bedside in [place] when I came on screen at 1400

## 2021-07-25 NOTE — ED Provider Notes (Signed)
[facility] Provider Note    Event Date/Time   First MD Initiated Contact with Patient 07/25/21 1356     (approximate)   History   Altered Mental Status and Code Stroke   HPI  Dustin Crane is a 81 y.o. male with a past medical history of diabetes, GERD, CAD, hypertension, and seizure who presents with an episode of speech disturbance and altered mental status acute onset this afternoon about 15 minutes prior to arrival in the.  Per the wife, the patient was out shopping with her when he suddenly became unable to speak but was able to move.  Subsequently he started to be able to speak again but did not remember the episode and appeared confused.  The patient himself denies any complaints right now except for not knowing what happened to him.  He denies any headache, nausea or vomiting, vision changes, or other acute symptoms.   Physical Exam   Triage Vital Signs: ED Triage Vitals  Enc Vitals Group     BP 07/25/21 1415 (!) 115/93     Pulse Rate 07/25/21 1415 75     Resp 07/25/21 1415 20     Temp 07/25/21 1423 98.2 F (36.8 C)     Temp Source 07/25/21 1423 Oral     SpO2 07/25/21 1415 95 %     Weight --      Height --      Head Circumference --      Peak Flow --      Pain Score --      Pain Loc --      Pain Edu? --      Excl. in [place]? --     Most recent vital signs: Vitals:   07/25/21 1530 07/25/21 1600  BP: 131/73 (!) 122/58  Pulse: 81 82  Resp: (!) 22 17  Temp:    SpO2: 96% 96%     General: Alert, mildly confused appearing but oriented x4 and in no acute distress. CV:  Good peripheral perfusion.  Resp:  Normal effort.  Abd:  No distention.  Other:  EOMI.  PERRLA.  No facial droop.  Motor intact in all extremities.  Normal coordination.   ED Results / Procedures / Treatments   Labs (all labs ordered are listed, but only abnormal results are displayed) Labs Reviewed  APTT - Abnormal; Notable for the following components:       Result Value   aPTT 39 (*)    All other components within normal limits  CBC - Abnormal; Notable for the following components:   Hemoglobin 12.9 (*)    HCT 38.3 (*)    All other components within normal limits  DIFFERENTIAL - Abnormal; Notable for the following components:   Abs Immature Granulocytes 0.09 (*)    All other components within normal limits  COMPREHENSIVE METABOLIC PANEL - Abnormal; Notable for the following components:   Glucose, Bld 249 (*)    Calcium 8.4 (*)    All other components within normal limits  CBG MONITORING, ED - Abnormal; Notable for the following components:   Glucose-Capillary 246 (*)    All other components within normal limits  PROTIME-INR  CBG MONITORING, ED  TROPONIN I (HIGH SENSITIVITY)  TROPONIN I (HIGH SENSITIVITY)     EKG  ED ECG REPORT I, Arta Silence, the attending physician, personally viewed and interpreted this ECG.  Date: 07/25/2021 EKG Time: 1420 Rate: 90 Rhythm: normal sinus rhythm with PVCs QRS Axis: normal  Intervals: normal ST/T Wave abnormalities: normal Narrative Interpretation: no evidence of acute ischemia    RADIOLOGY  CT head: I independently viewed and interpreted the images; there is no ICH or evidence of acute stroke  PROCEDURES:  Critical Care performed: Yes  .Critical Care Performed by: Arta Silence, MD Authorized by: Arta Silence, MD   Critical care provider statement:    Critical care time (minutes):  30   Critical care was necessary to treat or prevent imminent or life-threatening deterioration of the following conditions:  CNS failure or compromise   Critical care was time spent personally by me on the following activities:  Development of treatment plan with patient or surrogate, discussions with consultants, evaluation of patient's response to treatment, examination of patient, ordering and review of laboratory studies, ordering and review of radiographic studies, ordering and  performing treatments and interventions, pulse oximetry, re-evaluation of patient's condition and review of old charts   MEDICATIONS ORDERED IN ED: Medications  sodium chloride flush (NS) 0.9 % injection 3 mL (3 mLs Intravenous Given 07/25/21 1500)     IMPRESSION / MDM / ASSESSMENT AND PLAN / ED COURSE  I reviewed the triage vital signs and the nursing notes.  81 year old male with PMH as noted above presents with acute onset of alteration mental status and speech disturbance which now appears to be resolving.  On exam the vital signs are normal.  The patient has amnesia to the event and appears mildly confused but is oriented and has an otherwise normal neurologic exam.  Code stroke was called from triage.  The patient then went to CT.  I consulted with Dr. [name] for neurology who evaluated the patient.  Differential diagnosis includes, but is not limited to, seizure, TIA, CVA, transient global amnesia, hypoglycemia, electrolyte abnormality, other metabolic disturbance.  CT head is negative for acute findings we will obtain lab work-up and observe the patient.  Dr. [name] advised that the patient is continue to improve.  He recommends MRI of the brain and observation in the ED for the next few hours.  If patient returns to baseline and has negative MRI he may be able to go home  The patient is on the cardiac monitor to evaluate for evidence of arrhythmia and/or significant heart rate changes.  ----------------------------------------- 4:31 PM on 07/25/2021 -----------------------------------------  MRI is pending.  I signed the patient out to the oncoming ED physician Dr. Jari Pigg.   FINAL CLINICAL IMPRESSION(S) / ED DIAGNOSES   Final diagnoses:  Altered mental status, unspecified altered mental status type     Rx / DC Orders   ED Discharge Orders     None        Note:  This document was prepared using Dragon voice recognition software and may include  unintentional dictation errors.    Arta Silence, MD 07/25/21 [phone]

## 2021-07-25 NOTE — ED Notes (Signed)
Pt transported back to room from MRI.

## 2021-07-25 NOTE — Consult Note (Signed)
E Alert button for Code stroke pressed at 1400

## 2021-07-25 NOTE — ED Notes (Addendum)
Dr. Jari Pigg at bedside discussing test results and plan of care with patient and spouse.

## 2021-07-25 NOTE — Consult Note (Signed)
Neurology Consultation Reason for Consult: Episode of speech arrest Referring Physician: Kennith Gain  CC: Episode of decreased speech  History is obtained from: Wife, patient  HPI: Dustin Crane is a 81 y.o. male who presents with an episode of decreased speech.  He was out shopping with his wife when he suddenly had complete behavioral arrest.  She asked him are you having trouble speaking, and he was able to nod.  He was able to move, and she was able to get him to the hospital.  Once arrived at the hospital, he improved.  I initially evaluated him in the CT scanner, and by that point he was speaking fluently.  He was completely amnestic to the episode, however.  He has had two episodes of note in the past.  The first happened in March 2022 following a urological procedure.  He had been in a lot of pain, and had been taking some pain medicines, and had an episode.  Of note, the patient states that he has no recollection of the hospitalization in December.   LKW: 1:30 PM tpa given?: no, rapidly improving symptoms   ROS: A 14 point ROS was performed and is negative except as noted in the HPI.  Past Medical History:  Diagnosis Date   Arthritis    Cancer ([place])    skin cancer with tags removed   Coronary artery disease    Diabetes mellitus without complication (HCC)    GERD (gastroesophageal reflux disease)    Hyperlipemia    Hypertension    Myocardial infarction [facility]) 2004   2 stents   Peripheral vascular disease (HCC)    TIA (transient ischemic attack) 10/2018   PATIENT DENIES THIS DIAGNOSIS   Wears glasses    Wears hearing aid    both ears     Family History  Problem Relation Age of Onset   Coronary artery disease Mother    Diabetes Mother    Alzheimer's disease Mother    Heart disease Father    Diabetes Father      Social History:  reports that he quit smoking about 45 years ago. His smoking use included cigarettes. He has never used smokeless tobacco. He  reports current alcohol use. He reports that he does not use drugs.   Exam: Current vital signs: BP (!) 115/93   Pulse 75   Temp 98.2 F (36.8 C) (Oral)   Resp 20   SpO2 95%  Vital signs in last 24 hours: Temp:  [98.2 F (36.8 C)] 98.2 F (36.8 C) (05/20 1423) Pulse Rate:  [75] 75 (05/20 1415) Resp:  [20] 20 (05/20 1415) BP: (115)/(93) 115/93 (05/20 1415) SpO2:  [95 %] 95 % (05/20 1415)   Physical Exam  Constitutional: Appears well-developed and well-nourished.  Psych: Affect appropriate to situation Eyes: No scleral injection HENT: No OP obstruction MSK: no joint deformities.  Cardiovascular: Normal rate and regular rhythm.  Respiratory: Effort normal, non-labored breathing GI: Soft.  No distension. There is no tenderness.  Skin: WDI  Neuro: Mental Status: Patient is awake, alert, oriented to person, place, month. Patient is able to give a clear and coherent history. No signs of aphasia or neglect Cranial Nerves: II: Visual Fields are full. Pupils are equal, round, and reactive to light.   III,IV, VI: EOMI without ptosis or diploplia.  V: Facial sensation is symmetric to temperature VII: Facial movement is symmetric.  VIII: hearing is intact to voice X: Uvula elevates symmetrically XI: Shoulder shrug is symmetric. XII:  tongue is midline without atrophy or fasciculations.  Motor: Tone is normal. Bulk is normal. 5/5 strength was present in all four extremities.  Sensory: Sensation is symmetric to light touch and temperature in the arms and legs. Cerebellar: FNF and HKS are intact bilaterally    I have reviewed labs in epic and the results pertinent to this consultation are: Creatinine 0.95  I have reviewed the images obtained: CT head-negative  Impression: 81 year old male with a history of two episodes previously who presents with an episode of speech arrest.  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  He does have a previous cortical lesion that could serve as a seizure focus, and I did recommend to the patient and his wife to consider going on antiepileptic therapy, but given the semiology they were hesitant to do so at this time.  Recommendations: 1) driving prohibition for at least 6 months, this was discussed with the patient and the wife who expressed understanding.   2) I recommended antiepileptic therapy, but they would prefer to hold off until discussing it with her outpatient neurologist 3) MRI brain 4) if MRI is negative he can follow-up with outpatient neurology   Roland Rack, MD [facility] [phone]  If 7pm- 7am, please page neurology on call as listed in [place].

## 2021-07-25 NOTE — ED Notes (Signed)
Dr Cherylann Banas cleared for CT

## 2021-07-25 NOTE — ED Triage Notes (Addendum)
Pt comes pov with wife with confusion and expressive aphasia starting 15 mins PTA. Code stroke activated and pt taken to CT with triage RN.

## 2021-07-25 NOTE — ED Notes (Signed)
Dr. [name] at bedside speaking with spouse.

## 2021-07-25 NOTE — ED Provider Notes (Signed)
6:49 PM Assumed care for off going team.   Blood pressure 119/80, pulse 87, temperature 98.2 F (36.8 C), temperature source Oral, resp. rate (!) 21, SpO2 97 %.  See their HPI for full report but in brief pending MRI   MRI is negative  Discussed with family and they were open to doing antiepileptics if neurology thought that they should.  I have repaged neurology to further discuss  Discussed with Dr. [name] who does recommend starting patient on Keppra 500 twice daily with loading 1000 mg at this time.   Family feels comfortable with discharge home.  Patient is at his baseline self and they are willing to start the antiseizure meds.         Vanessa [place], MD 07/25/21 [phone]

## 2021-07-25 NOTE — Discharge Instructions (Addendum)
  Please call your neurologist to get follow-up with our neurologist strongly recommended you start an antiseizure medicine.  You can start this tomorrow morning and return to the ER if you develop recurrent episodes or any other concern    Per [facility] statutes, patients with seizures are not allowed to drive until  they have been seizure-free for six months. Use caution when using heavy equipment or power tools. Avoid working on ladders or at heights. Take showers instead of baths. Ensure the water temperature is not too high on the home water heater. Do not go swimming alone. When caring for infants or small children, sit down when holding, feeding, or changing them to minimize risk of injury to the child in the event you have a seizure.   Also, Maintain good sleep hygiene. Avoid alcohol.

## 2021-07-25 NOTE — ED Notes (Signed)
Pt transported to MRI via stretcher.  

## 2021-07-25 NOTE — Consult Note (Signed)
[facility] TS Dr [name] paged by TS RN at [phone]

## 2021-11-11 ENCOUNTER — Encounter: Payer: Self-pay | Admitting: Urology

## 2021-11-11 ENCOUNTER — Ambulatory Visit (INDEPENDENT_AMBULATORY_CARE_PROVIDER_SITE_OTHER): Payer: Medicare Other | Admitting: Urology

## 2021-11-11 VITALS — BP 123/71 | HR 75 | Ht 69.0 in | Wt 168.0 lb

## 2021-11-11 DIAGNOSIS — R350 Frequency of micturition: Secondary | ICD-10-CM | POA: Diagnosis not present

## 2021-11-11 DIAGNOSIS — N138 Other obstructive and reflux uropathy: Secondary | ICD-10-CM

## 2021-11-11 DIAGNOSIS — N401 Enlarged prostate with lower urinary tract symptoms: Secondary | ICD-10-CM

## 2021-11-11 LAB — BLADDER SCAN AMB NON-IMAGING

## 2021-11-11 NOTE — Progress Notes (Signed)
11/11/2021 10:02 AM   Dustin Crane 1940-07-21 [id number]  Referring provider: Idelle Crouch, MD [facility] [place],  [place] [address]  Chief Complaint  Patient presents with   Benign Prostatic Hypertrophy    HPI: 81 year old male with a personal history of elevated PSA, BPH with LUTS, ED and retrograde ejaculation who presents today for annual follow-up.  He underwent a UroLift procedure in 04/6710 complicated by UTI/sepsis and prolonged hospitalization.  He also has urinary urgency frequency and was previously on Gemtesa 75 mg which worked well but was not covered by Insurance underwriter.  They ended up switching to [place] in November.  He reports that he took 1 dose of this and then had difficulty urinating.  This scared him enough that he never wanted to take it again.  Today, he reports that he is doing fairly well.  He gets up 3 times at night but this does not bother him.  Occasionally he will have urgency and rare urge incontinence but he wears a safety pad.  Overall, his bother is minimal.  He denies any dysuria.  As of a year ago, his PSA had normalized to 3.78.  UA on 10/14/2021 negative.  He does mention today that since his last visit, he had mini stroke and now is being treated for seizures.  He can no longer drive which is frustrating him.  He is also drinking nonalcoholic beers.   IPSS     Row Name 11/11/21 0944         International Prostate Symptom Score   How often have you had the sensation of not emptying your bladder? Less than 1 in 5     How often have you had to urinate less than every two hours? Less than 1 in 5 times     How often have you found you stopped and started again several times when you urinated? About half the time     How often have you found it difficult to postpone urination? About half the time     How often have you had a weak urinary stream? Not at All     How often have you had to strain to start  urination? Not at All     How many times did you typically get up at night to urinate? 3 Times     Total IPSS Score 11       Quality of Life due to urinary symptoms   If you were to spend the rest of your life with your urinary condition just the way it is now how would you feel about that? Mixed              Score:  1-7 Mild 8-19 Moderate 20-35 Severe    PMH: Past Medical History:  Diagnosis Date   Arthritis    Cancer ([place])    skin cancer with tags removed   Coronary artery disease    Diabetes mellitus without complication (HCC)    GERD (gastroesophageal reflux disease)    Hyperlipemia    Hypertension    Myocardial infarction [facility]) 2004   2 stents   Peripheral vascular disease ([place])    TIA (transient ischemic attack) 10/2018   PATIENT DENIES THIS DIAGNOSIS   Wears glasses    Wears hearing aid    both ears    Surgical History: Past Surgical History:  Procedure Laterality Date   ACHILLES TENDON SURGERY Right 12/07/2018   Procedure: ACHILLES TENDON REPAIR  SECONDARY;  Surgeon: Albertine Patricia, DPM;  Location: [place];  Service: Podiatry;  Laterality: Right;  LMA LOCAL DIABETIC   block   CALCANEAL OSTEOTOMY Right 12/07/2018   Procedure: PARTIAL CALCANECTOMY RIGHT;  Surgeon: Albertine Patricia, DPM;  Location: [place];  Service: Podiatry;  Laterality: Right;   CARDIAC CATHETERIZATION  2005   stents x2   CATARACT EXTRACTION W/PHACO Right 04/22/2020   Procedure: CATARACT EXTRACTION PHACO AND INTRAOCULAR LENS PLACEMENT (I[facility]) RIGHT DIABETIC 3.65 00:41.9;  Surgeon: Birder Robson, MD;  Location: [place];  Service: Ophthalmology;  Laterality: Right;   CATARACT EXTRACTION W/PHACO Left 05/06/2020   Procedure: CATARACT EXTRACTION PHACO AND INTRAOCULAR LENS PLACEMENT ([place]) LEFT DIABETIC MYALUGIN;  Surgeon: Birder Robson, MD;  Location: [place];  Service: Ophthalmology;  Laterality: Left;  Dexycu 9%  LOT [id number]  EXP  10/05/2020 given @ 0757 inj OS  8.30 1:12.4   COLONOSCOPY     COLONOSCOPY WITH PROPOFOL N/A 04/03/2020   Procedure: COLONOSCOPY WITH PROPOFOL;  Surgeon: [name], MD;  [facility];  Service: Gastroenterology;  Laterality: N/A;   CYSTOSCOPY WITH INSERTION OF UROLIFT N/A 05/19/2020   Procedure: CYSTOSCOPY WITH INSERTION OF UROLIFT;  Surgeon: Hollice Espy, MD;  Location: [facility];  Service: Urology;  Laterality: N/A;   FINGER ARTHROPLASTY Right 12/05/2012   Procedure: RIGHT INDEX FINGER IMPLANT ARTHROPLASTY;  Surgeon: Cammie Sickle., MD;  Location: [place];  Service: Orthopedics;  Laterality: Right;   HERNIA REPAIR  1990   rt ing    QUADRICEPS TENDON REPAIR Left 02/11/2017   Procedure: REPAIR QUADRICEP TENDON;  Surgeon: Leim Fabry, MD;  Location: [facility];  Service: Orthopedics;  Laterality: Left;   SHOULDER ACROMIOPLASTY  1967   [place]    SHOULDER ARTHROSCOPY  2012   right   TONSILLECTOMY      Home Medications:  Allergies as of 11/11/2021       Reactions   Haemophilus B Polysaccharide Vaccine Anaphylaxis, Shortness Of Breath, Rash   Rash-severe n/v/d   Haemophilus Influenzae Anaphylaxis, Shortness Of Breath, Rash   Rash-severe n/v/d Ended up in hospital after taking!   Influenza Vac Split [influenza Virus Vaccine] Shortness Of Breath, Other (See Comments)   Rash-severe n/v/d   Hydroxyzine    Patient denies?????   Compazine [prochlorperazine Edisylate] Other (See Comments)   Muscle tightness        Medication List        Accurate as of November 11, 2021 10:02 AM. If you have any questions, ask your nurse or doctor.          STOP taking these medications    aspirin 81 MG tablet Stopped by: Hollice Espy, MD   Fish Oil 1200 MG Caps Stopped by: Hollice Espy, MD       TAKE these medications    atorvastatin 40 MG tablet Commonly known as: LIPITOR Take 40 mg by mouth in the morning and at bedtime.    cholecalciferol 25 MCG (1000 UNIT) tablet Commonly known as: VITAMIN D3 Take 1,000 Units by mouth daily.   ferrous sulfate 325 (65 FE) MG tablet Take 325 mg by mouth 3 (three) times a week.   glimepiride 4 MG tablet Commonly known as: AMARYL Take 4 mg by mouth 2 (two) times daily.   levETIRAcetam 500 MG tablet Commonly known as: Keppra Take 1 tablet (500 mg total) by mouth 2 (two) times daily.   magnesium oxide 400 (240 Mg) MG tablet Commonly  known as: MAG-OX Take 1 tablet by mouth daily.   metFORMIN 500 MG tablet Commonly known as: GLUCOPHAGE Take 500 mg by mouth 2 (two) times daily with a meal.   methocarbamol 500 MG tablet Commonly known as: ROBAXIN   multivitamin with minerals tablet Take 0.5 tablets by mouth 2 (two) times daily.   omeprazole 20 MG capsule Commonly known as: PRILOSEC Take 20 mg by mouth 2 (two) times daily.   pioglitazone 30 MG tablet Commonly known as: ACTOS Take 30 mg by mouth daily.   ramipril 10 MG capsule Commonly known as: ALTACE Hold while you are taking Bactrim.  Can resume afterwards. What changed:  how much to take how to take this when to take this additional instructions   sitaGLIPtin 100 MG tablet Commonly known as: JANUVIA Take 100 mg by mouth daily.   tadalafil 5 MG tablet Commonly known as: CIALIS Take 5 mg by mouth daily.        Allergies:  Allergies  Allergen Reactions   Haemophilus B Polysaccharide Vaccine Anaphylaxis, Shortness Of Breath and Rash    Rash-severe n/v/d    Haemophilus Influenzae Anaphylaxis, Shortness Of Breath and Rash    Rash-severe n/v/d Ended up in hospital after taking!    Influenza Vac Split [Influenza Virus Vaccine] Shortness Of Breath and Other (See Comments)    Rash-severe n/v/d   Hydroxyzine     Patient denies?????   Compazine [Prochlorperazine Edisylate] Other (See Comments)    Muscle tightness    Family History: Family History  Problem Relation Age of Onset   Coronary  artery disease Mother    Diabetes Mother    Alzheimer's disease Mother    Heart disease Father    Diabetes Father     Social History:  reports that he quit smoking about 45 years ago. His smoking use included cigarettes. He has never used smokeless tobacco. He reports current alcohol use. He reports that he does not use drugs.   Physical Exam: BP 123/71   Pulse 75   Ht '5\' 9"'$  (1.753 m)   Wt 168 lb (76.2 kg)   BMI 24.81 kg/m   Constitutional:  Alert and oriented, No acute distress.  Accompanied by his wife today. HEENT: [place] AT, moist mucus membranes.  Trachea midline, no masses. Cardiovascular: No clubbing, cyanosis, or edema. Skin: No rashes, bruises or suspicious lesions. Neurologic: Grossly intact, no focal deficits, moving all 4 extremities. Psychiatric: Normal mood and affect.  Laboratory Data: Lab Results  Component Value Date   WBC 9.0 07/25/2021   HGB 12.9 (L) 07/25/2021   HCT 38.3 (L) 07/25/2021   MCV 89.9 07/25/2021   PLT 222 07/25/2021    Lab Results  Component Value Date   CREATININE 0.95 07/25/2021    Lab Results  Component Value Date   HGBA1C 7.1 (H) 03/01/2021   Results for orders placed or performed in visit on 11/11/21  Bladder Scan (Post Void Residual) in office  Result Value Ref Range   Scan Result 40m     Assessment & Plan:    1. Benign prostatic hyperplasia with urinary frequency Emptying well currently on no BPH medications  In the past, he has done well with Gemtesa but this is not cost effective.  Prefers to avoid [place]  At this point time, he is reasonably satisfied with his urinary symptoms and does not want any further intervention.  His PSA is normalized and his urinalysis is negative.  He can follow-up as needed if his symptoms  worsen or he wants to pursue any further treatment or work-up.  - Bladder Scan (Post Void Residual) in office   Return if symptoms worsen or fail to improve.  Hollice Espy, MD  [facility]  [facility] [address], [place], [place] [address] [phone]

## 2021-11-19 IMAGING — MR MR HEAD WO/W CM
14 of 15 series · 41 of 48 positions shown · IV contrast (gadavist)
Comparison: None.

CLINICAL DATA: Syncope

EXAM:
MRI HEAD WITHOUT AND WITH CONTRAST
TECHNIQUE: Multiplanar, multiecho pulse sequences of the brain and surrounding
structures were obtained without and with intravenous contrast.
CONTRAST:  8mL GADAVIST GADOBUTROL 1 MMOL/ML IV SOLN

[Series 5: T1 · sagittal · 5.0mm · 0.62mm/px · 1 of 25 slices shown]
[im 1/25]
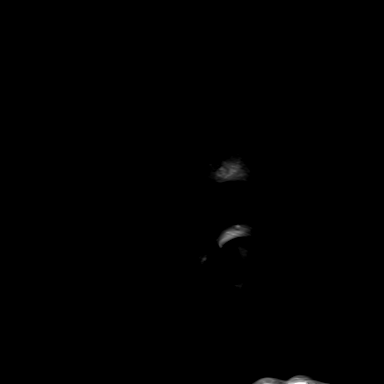

[Series 6: ax dwi_tracew · axial · 3.0mm · 0.60mm/px · z∈[-93,+68]mm · 2 of 50 slices shown]
[im 1/50]
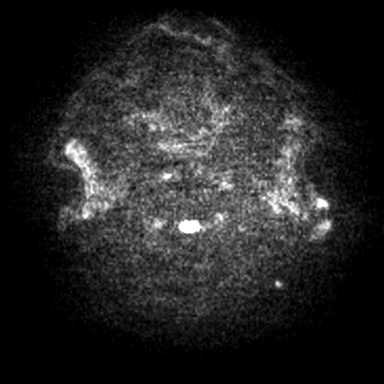
[im 50/50]
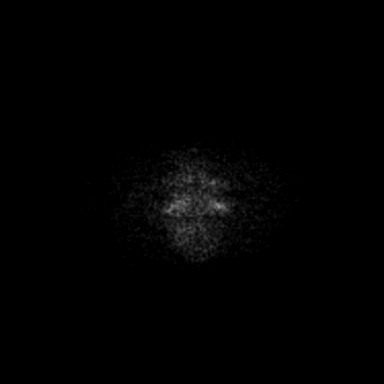

[Series 7: ax dwi_adc · axial · 3.0mm · 0.60mm/px · z∈[-93,+68]mm · 2 of 50 slices shown]
[im 1/50]
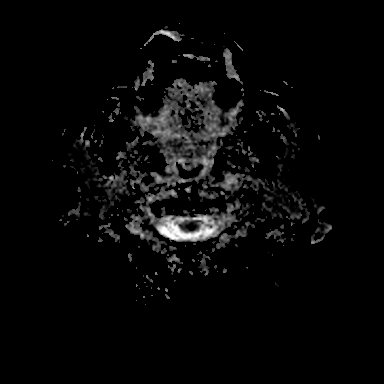
[im 50/50]
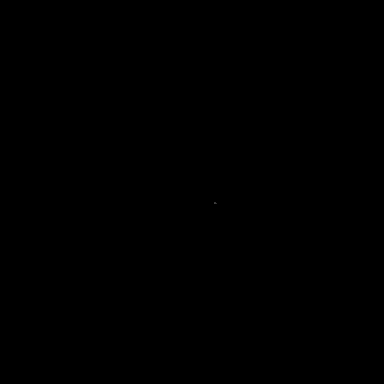

[Series 8: cor dwi_tracew · coronal · 5.0mm · 0.60mm/px · 2 of 40 slices shown]
[im 1/40]
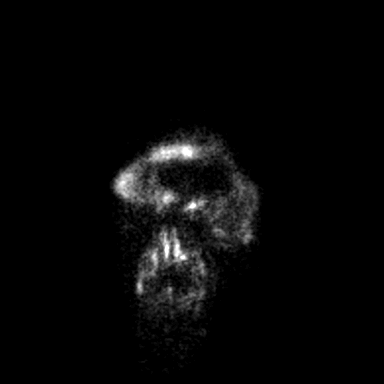
[im 40/40]
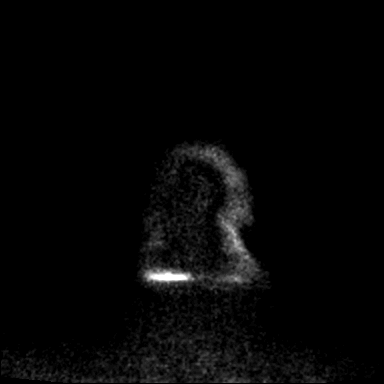

[Series 9: cor dwi_adc · coronal · 5.0mm · 0.60mm/px · 2 of 38 slices shown]
[im 1/38]
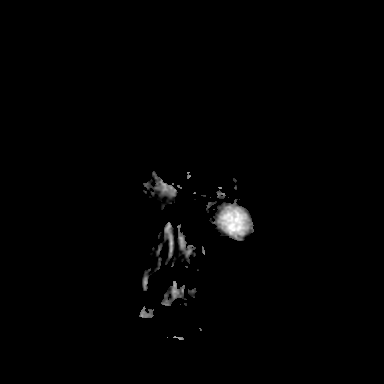
[im 38/38]
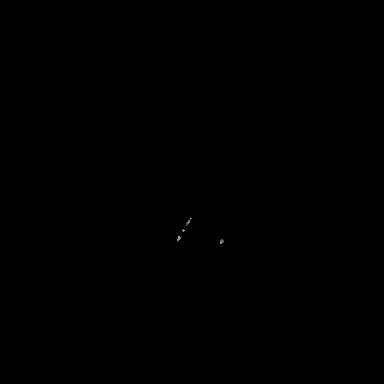

[Series 10: T2 · coronal · 3.0mm · 0.47mm/px · 2 of 35 slices shown (1 of 2)]
[im 1/35]
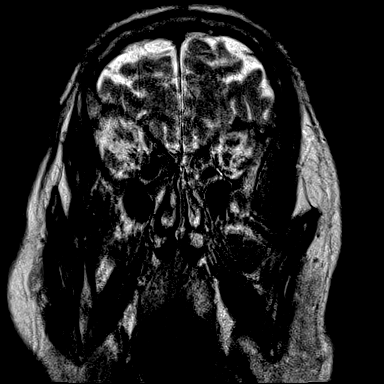
[im 35/35]
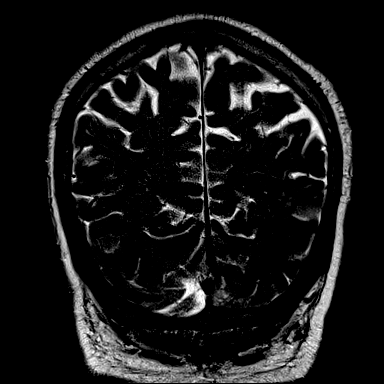

[Series 11: FLAIR · axial · 3.0mm · 0.53mm/px · z∈[-94,+68]mm · 3 of 55 slices shown (1 of 2)]
[im 1/55]
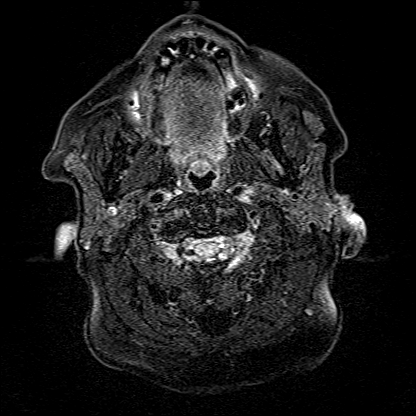
[im 28/55]
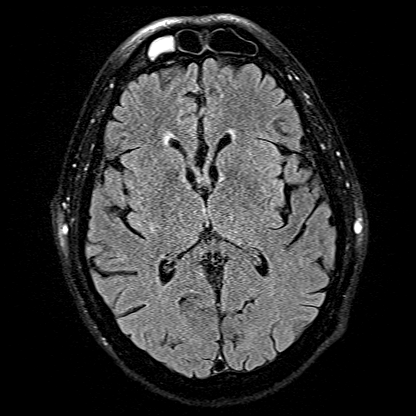
[im 55/55]
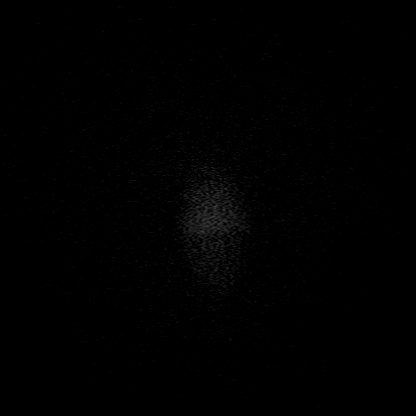

[Series 12: FLAIR · coronal · 3.0mm · 0.47mm/px · 2 of 35 slices shown (2 of 2)]
[im 1/35]
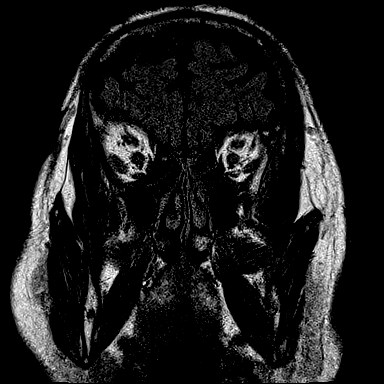
[im 35/35]
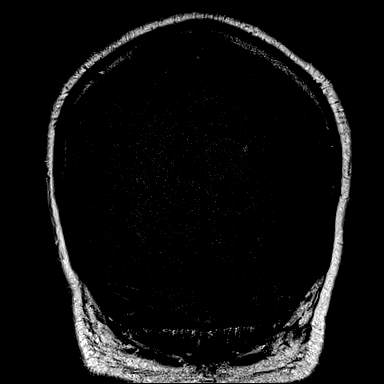

[Series 13: T2 · axial · 5.0mm · 0.53mm/px · 1 of 27 slices shown (2 of 2)]
[im 1/27]
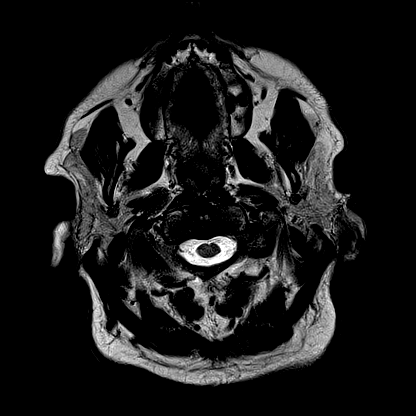

[Series 14: T2-star · axial · 5.0mm · 0.45mm/px · 1 of 27 slices shown]
[im 1/27]
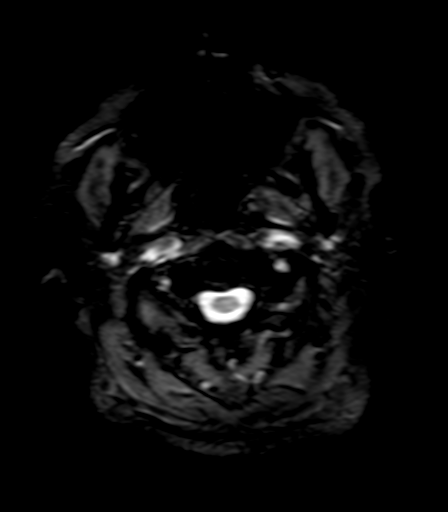

[Series 16: T1 post-contrast · axial · 1.0mm · 0.98mm/px · z∈[-101,+74]mm · 9 of 175 slices shown (1 of 3)]
[im 1/175]
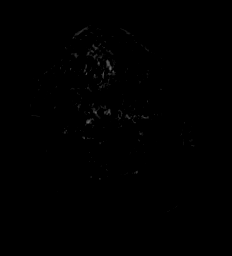
[im 22/175]
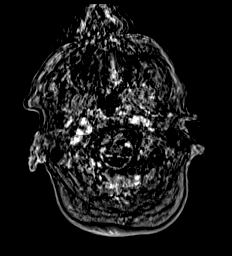
[im 44/175]
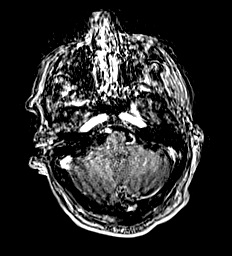
[im 66/175]
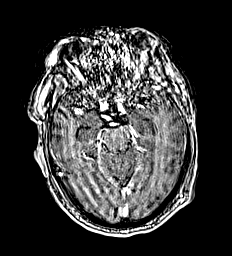
[im 88/175]
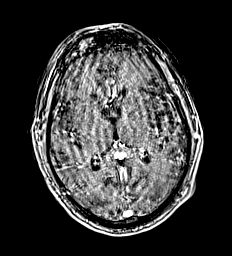
[im 109/175]
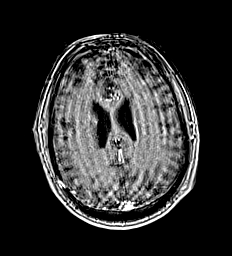
[im 131/175]
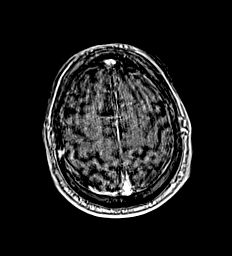
[im 153/175]
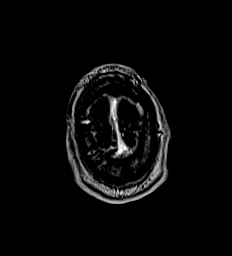
[im 175/175]
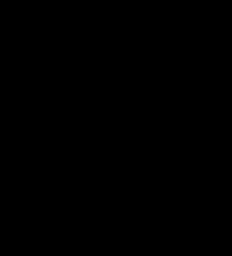

[Series 17: T1 post-contrast · axial · 1.0mm · 0.98mm/px · z∈[-101,+74]mm · 9 of 176 slices shown (2 of 3)]
[im 1/176]
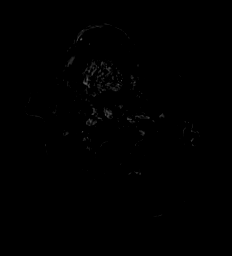
[im 22/176]
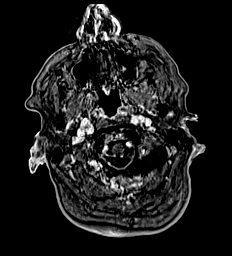
[im 44/176]
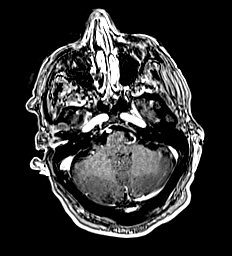
[im 66/176]
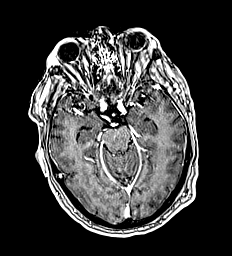
[im 88/176]
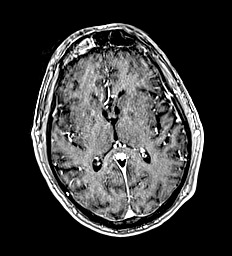
[im 110/176]
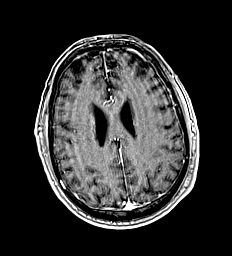
[im 132/176]
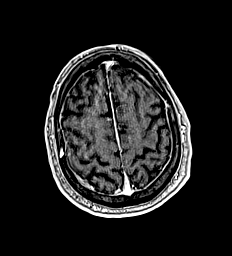
[im 154/176]
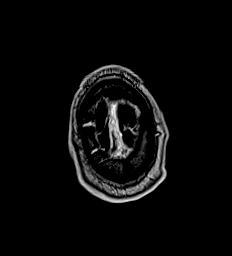
[im 176/176]
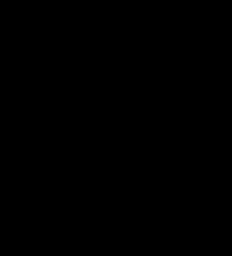

[Series 18: T1 post-contrast · coronal · 5.0mm · 0.90mm/px · 2 of 32 slices shown (3 of 3)]
[im 1/32]
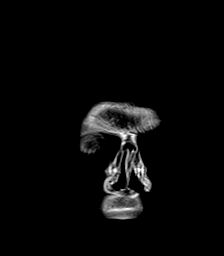
[im 32/32]
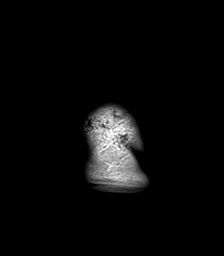

[Series 1036: cor thins rl · coronal · 3.0mm · 0.49mm/px · 3 of 107 slices shown]
[im 1/107]
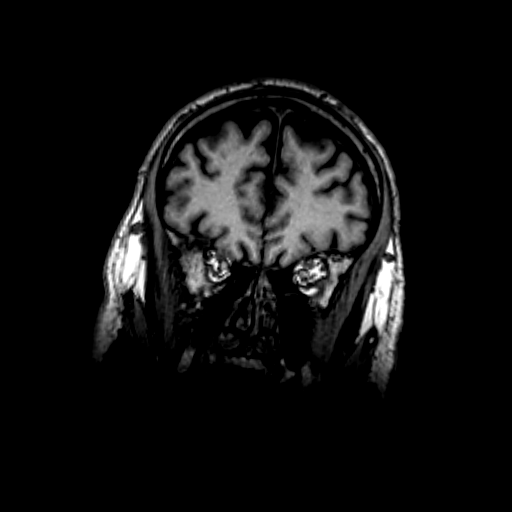
[im 27/107]
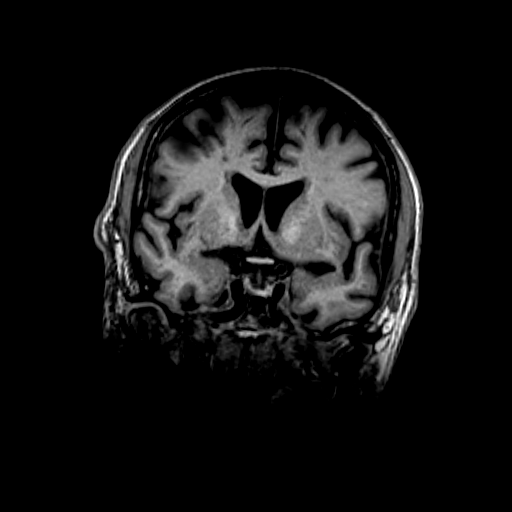
[im 54/107]
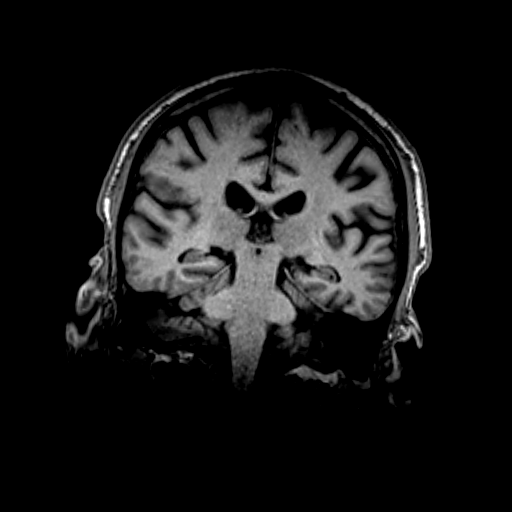

[41 of 48 positions shown; findings below may reference images not displayed]

FINDINGS: Brain: No acute infarct, mass effect or extra-axial collection. No
acute or chronic hemorrhage. There is multifocal hyperintense
T2-weighted signal within the white matter. Parenchymal volume and
CSF spaces are normal. Old posterior left parietal subcortical
infarct. Old small vessel infarcts of the right corona radiata. The
midline structures are normal. There is no abnormal contrast
enhancement.

Vascular: Major flow voids are preserved.

Skull and upper cervical spine: Normal calvarium and skull base.
Visualized upper cervical spine and soft tissues are normal.

Sinuses/Orbits:Partial right frontal sinus opacification. Normal
orbits.
IMPRESSION: 1. No acute intracranial abnormality.
2. Old left parietal subcortical infarct and multiple small vessel
infarcts of the right corona radiata.

## 2021-11-27 IMAGING — DX DG CHEST 1V PORT
1 series · 2 of 2 positions shown · non-contrast
Comparison: May 27, 2020

CLINICAL DATA: Altered mental status.  Concern for sepsis

EXAM:
PORTABLE CHEST 1 VIEW

[Series 1: chest ap · 0.14mm/px · 2 of 2 slices shown]
[im 1/2]
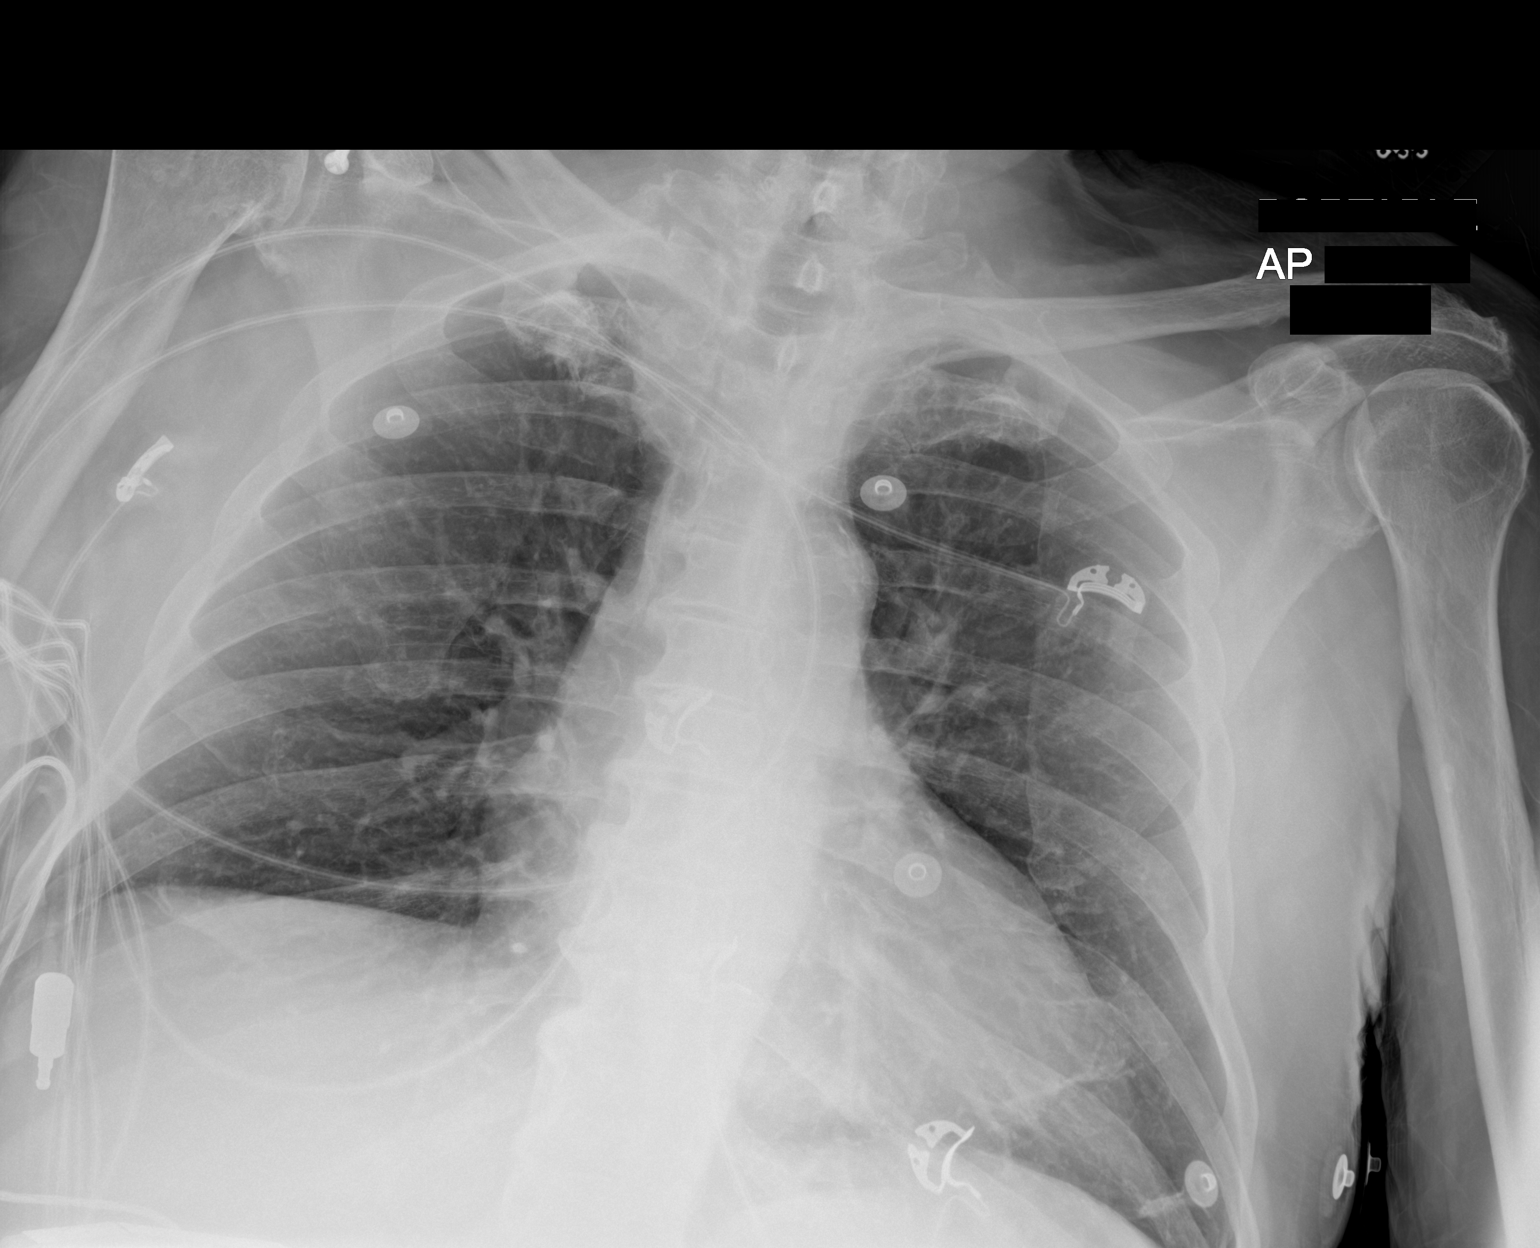
[im 2/2]
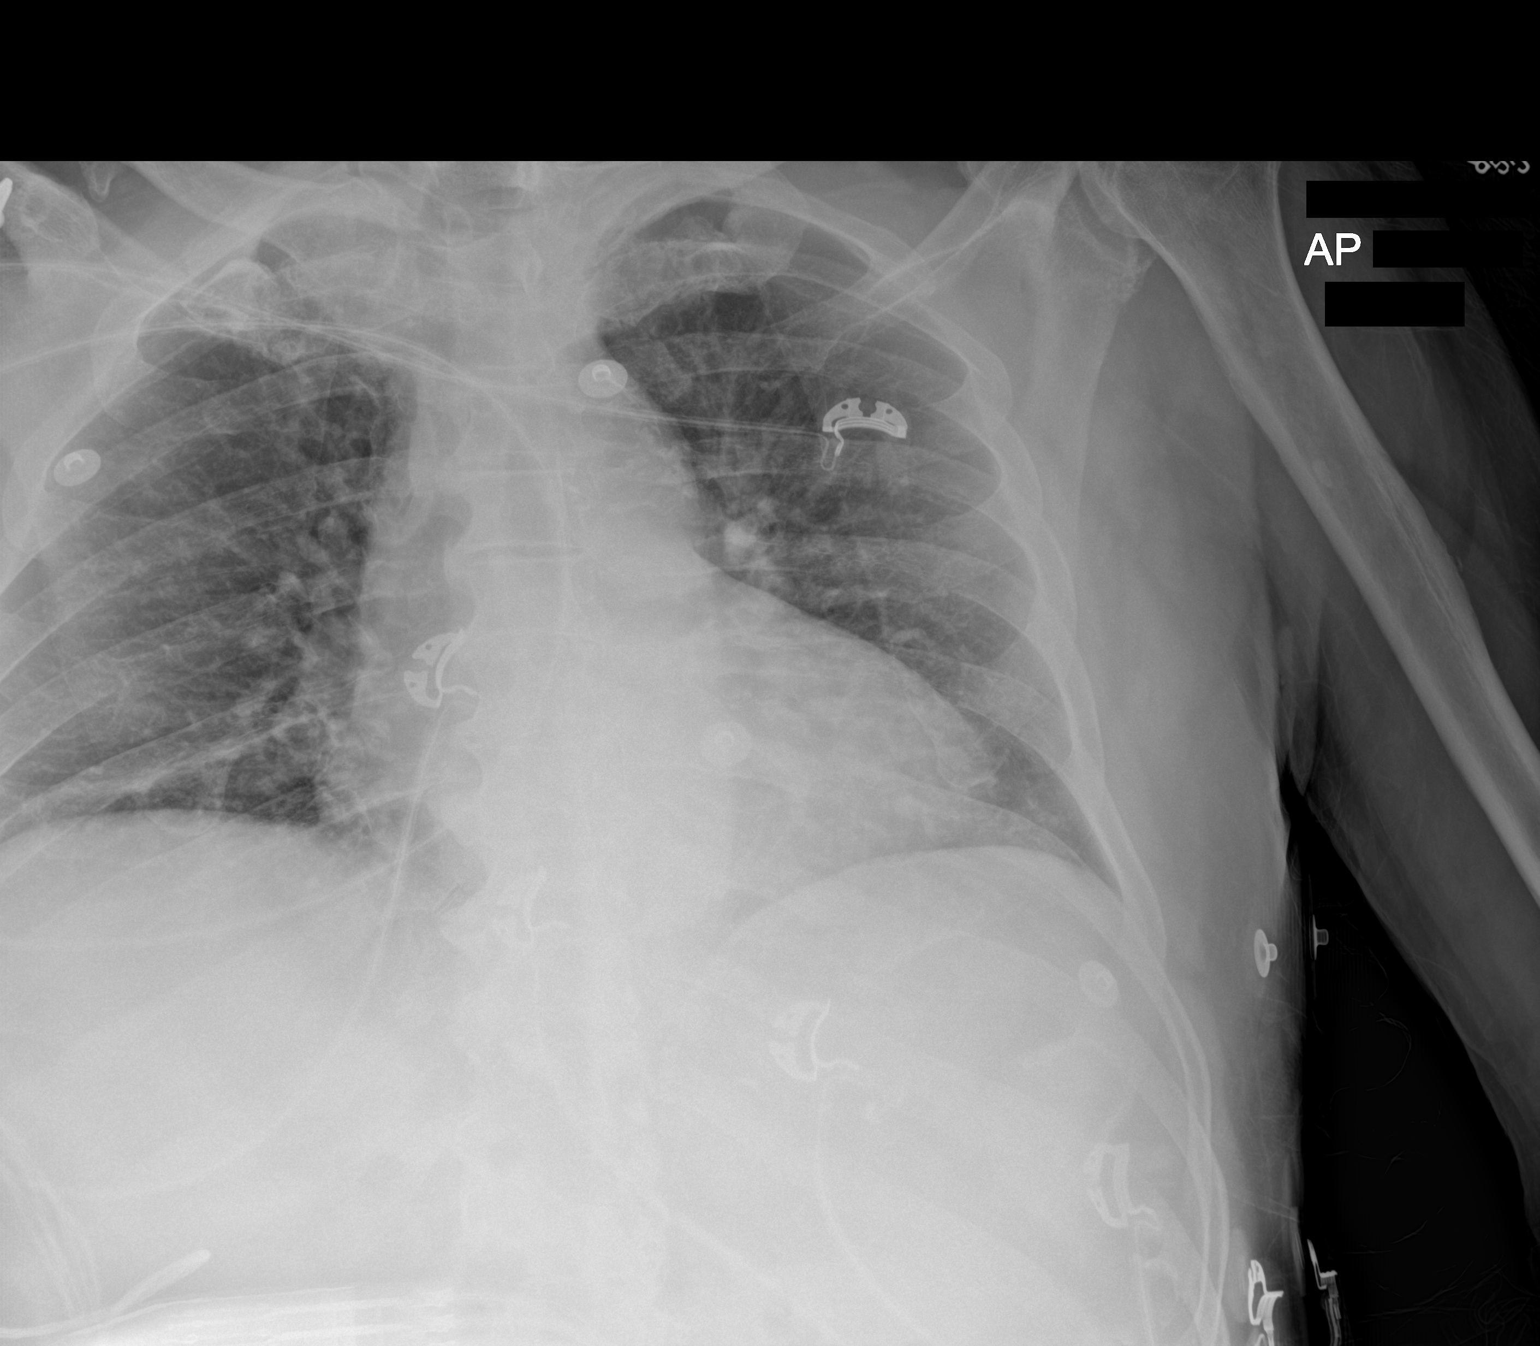

[2 of 2 positions shown; findings below may reference images not displayed]

FINDINGS: Lungs are clear. Heart upper normal in size with pulmonary
vascularity normal. There is aortic atherosclerosis. Postoperative
change noted in right shoulder. No evident adenopathy.
IMPRESSION: Lungs clear. Heart upper normal in size. Aortic Atherosclerosis
([id number]).

## 2022-01-04 ENCOUNTER — Encounter (INDEPENDENT_AMBULATORY_CARE_PROVIDER_SITE_OTHER): Payer: Self-pay

## 2022-06-07 ENCOUNTER — Encounter (INDEPENDENT_AMBULATORY_CARE_PROVIDER_SITE_OTHER): Payer: Medicare Other

## 2022-06-07 ENCOUNTER — Ambulatory Visit (INDEPENDENT_AMBULATORY_CARE_PROVIDER_SITE_OTHER): Payer: Medicare Other | Admitting: Vascular Surgery

## 2022-06-30 ENCOUNTER — Other Ambulatory Visit (INDEPENDENT_AMBULATORY_CARE_PROVIDER_SITE_OTHER): Payer: Self-pay | Admitting: Vascular Surgery

## 2022-06-30 DIAGNOSIS — I6523 Occlusion and stenosis of bilateral carotid arteries: Secondary | ICD-10-CM

## 2022-07-01 ENCOUNTER — Encounter (INDEPENDENT_AMBULATORY_CARE_PROVIDER_SITE_OTHER): Payer: Medicare Other

## 2022-07-01 ENCOUNTER — Ambulatory Visit (INDEPENDENT_AMBULATORY_CARE_PROVIDER_SITE_OTHER): Payer: Medicare Other | Admitting: Vascular Surgery

## 2022-07-07 ENCOUNTER — Ambulatory Visit (INDEPENDENT_AMBULATORY_CARE_PROVIDER_SITE_OTHER): Payer: Medicare Other

## 2022-07-07 DIAGNOSIS — I6523 Occlusion and stenosis of bilateral carotid arteries: Secondary | ICD-10-CM

## 2022-09-03 ENCOUNTER — Other Ambulatory Visit: Payer: Self-pay | Admitting: Physician Assistant

## 2022-09-03 DIAGNOSIS — N644 Mastodynia: Secondary | ICD-10-CM

## 2022-09-21 ENCOUNTER — Ambulatory Visit
Admission: RE | Admit: 2022-09-21 | Discharge: 2022-09-21 | Disposition: A | Discharge status: Home / Self Care | Payer: Medicare Other | Source: Ambulatory Visit | Attending: Physician Assistant | Admitting: Physician Assistant

## 2022-09-21 ENCOUNTER — Other Ambulatory Visit: Payer: Medicare Other

## 2022-09-21 DIAGNOSIS — N644 Mastodynia: Secondary | ICD-10-CM | POA: Insufficient documentation

## 2022-11-19 ENCOUNTER — Encounter (HOSPITAL_COMMUNITY): Payer: TRICARE For Life (TFL)

## 2022-11-22 ENCOUNTER — Other Ambulatory Visit: Payer: Self-pay

## 2022-11-22 ENCOUNTER — Inpatient Hospital Stay (HOSPITAL_COMMUNITY): Payer: Medicare Other

## 2022-11-22 ENCOUNTER — Inpatient Hospital Stay (HOSPITAL_COMMUNITY)
Admission: RE | Admit: 2022-11-22 | Discharge: 2022-11-26 | DRG: 101 | Disposition: A | Discharge status: Home / Self Care | Payer: Medicare Other | Source: Ambulatory Visit | Attending: Neurology | Admitting: Neurology

## 2022-11-22 ENCOUNTER — Encounter (HOSPITAL_COMMUNITY): Payer: Self-pay | Admitting: Neurology

## 2022-11-22 DIAGNOSIS — S0990XA Unspecified injury of head, initial encounter: Secondary | ICD-10-CM | POA: Diagnosis present

## 2022-11-22 DIAGNOSIS — I252 Old myocardial infarction: Secondary | ICD-10-CM | POA: Diagnosis not present

## 2022-11-22 DIAGNOSIS — Z8673 Personal history of transient ischemic attack (TIA), and cerebral infarction without residual deficits: Secondary | ICD-10-CM

## 2022-11-22 DIAGNOSIS — Z7984 Long term (current) use of oral hypoglycemic drugs: Secondary | ICD-10-CM

## 2022-11-22 DIAGNOSIS — Z820 Family history of epilepsy and other diseases of the nervous system: Secondary | ICD-10-CM | POA: Diagnosis not present

## 2022-11-22 DIAGNOSIS — Z79899 Other long term (current) drug therapy: Secondary | ICD-10-CM

## 2022-11-22 DIAGNOSIS — K219 Gastro-esophageal reflux disease without esophagitis: Secondary | ICD-10-CM | POA: Diagnosis present

## 2022-11-22 DIAGNOSIS — Z974 Presence of external hearing-aid: Secondary | ICD-10-CM | POA: Diagnosis not present

## 2022-11-22 DIAGNOSIS — W010XXA Fall on same level from slipping, tripping and stumbling without subsequent striking against object, initial encounter: Secondary | ICD-10-CM | POA: Diagnosis not present

## 2022-11-22 DIAGNOSIS — R569 Unspecified convulsions: Principal | ICD-10-CM

## 2022-11-22 DIAGNOSIS — Z8249 Family history of ischemic heart disease and other diseases of the circulatory system: Secondary | ICD-10-CM

## 2022-11-22 DIAGNOSIS — Z87891 Personal history of nicotine dependence: Secondary | ICD-10-CM

## 2022-11-22 DIAGNOSIS — Z833 Family history of diabetes mellitus: Secondary | ICD-10-CM

## 2022-11-22 DIAGNOSIS — I2510 Atherosclerotic heart disease of native coronary artery without angina pectoris: Secondary | ICD-10-CM | POA: Diagnosis present

## 2022-11-22 DIAGNOSIS — Z85828 Personal history of other malignant neoplasm of skin: Secondary | ICD-10-CM | POA: Diagnosis not present

## 2022-11-22 DIAGNOSIS — D72829 Elevated white blood cell count, unspecified: Secondary | ICD-10-CM | POA: Diagnosis not present

## 2022-11-22 DIAGNOSIS — I10 Essential (primary) hypertension: Secondary | ICD-10-CM | POA: Diagnosis present

## 2022-11-22 DIAGNOSIS — Z9861 Coronary angioplasty status: Secondary | ICD-10-CM

## 2022-11-22 DIAGNOSIS — E785 Hyperlipidemia, unspecified: Secondary | ICD-10-CM | POA: Diagnosis present

## 2022-11-22 DIAGNOSIS — E1151 Type 2 diabetes mellitus with diabetic peripheral angiopathy without gangrene: Secondary | ICD-10-CM | POA: Diagnosis present

## 2022-11-22 DIAGNOSIS — E119 Type 2 diabetes mellitus without complications: Secondary | ICD-10-CM | POA: Diagnosis not present

## 2022-11-22 DIAGNOSIS — G40909 Epilepsy, unspecified, not intractable, without status epilepticus: Principal | ICD-10-CM | POA: Diagnosis present

## 2022-11-22 DIAGNOSIS — D649 Anemia, unspecified: Secondary | ICD-10-CM | POA: Diagnosis not present

## 2022-11-22 DIAGNOSIS — Z7902 Long term (current) use of antithrombotics/antiplatelets: Secondary | ICD-10-CM | POA: Diagnosis not present

## 2022-11-22 LAB — PHOSPHORUS: Phosphorus: 3.9 mg/dL (ref 2.5–4.6)

## 2022-11-22 LAB — COMPREHENSIVE METABOLIC PANEL
ALT: 19 U/L (ref 0–44)
AST: 16 U/L (ref 15–41)
Albumin: 3.5 g/dL (ref 3.5–5.0)
Alkaline Phosphatase: 67 U/L (ref 38–126)
Anion gap: 9 (ref 5–15)
BUN: 18 mg/dL (ref 8–23)
CO2: 25 mmol/L (ref 22–32)
Calcium: 9 mg/dL (ref 8.9–10.3)
Chloride: 104 mmol/L (ref 98–111)
Creatinine, Ser: 0.92 mg/dL (ref 0.61–1.24)
GFR, Estimated: >60 mL/min (ref >60)
Glucose, Bld: 169 mg/dL — ABNORMAL HIGH (ref 70–99)
Potassium: 4.3 mmol/L (ref 3.5–5.1)
Sodium: 138 mmol/L (ref 135–145)
Total Bilirubin: 0.5 mg/dL (ref 0.3–1.2)
Total Protein: 6.2 g/dL — ABNORMAL LOW (ref 6.5–8.1)

## 2022-11-22 LAB — CBC WITH DIFFERENTIAL/PLATELET
Abs Immature Granulocytes: 0.05 10*3/uL (ref 0.00–0.07)
Basophils Absolute: 0 10*3/uL (ref 0.0–0.1)
Basophils Relative: 0 %
Eosinophils Absolute: 0.2 10*3/uL (ref 0.0–0.5)
Eosinophils Relative: 2 %
HCT: 32.8 % — ABNORMAL LOW (ref 39.0–52.0)
Hemoglobin: 11.6 g/dL — ABNORMAL LOW (ref 13.0–17.0)
Immature Granulocytes: 1 %
Lymphocytes Relative: 20 %
Lymphs Abs: 1.8 10*3/uL (ref 0.7–4.0)
MCH: 30.9 pg (ref 26.0–34.0)
MCHC: 35.4 g/dL (ref 30.0–36.0)
MCV: 87.2 fL (ref 80.0–100.0)
Monocytes Absolute: 0.8 10*3/uL (ref 0.1–1.0)
Monocytes Relative: 9 %
Neutro Abs: 6.2 10*3/uL (ref 1.7–7.7)
Neutrophils Relative %: 68 %
Platelets: 177 10*3/uL (ref 150–400)
RBC: 3.76 MIL/uL — ABNORMAL LOW (ref 4.22–5.81)
RDW: 11.8 % (ref 11.5–15.5)
WBC: 9 10*3/uL (ref 4.0–10.5)
nRBC: 0 % (ref 0.0–0.2)

## 2022-11-22 LAB — PROTIME-INR
INR: 1 (ref 0.8–1.2)
Prothrombin Time: 13.5 s (ref 11.4–15.2)

## 2022-11-22 LAB — MAGNESIUM: Magnesium: 1.3 mg/dL — ABNORMAL LOW (ref 1.7–2.4)

## 2022-11-22 LAB — GLUCOSE, CAPILLARY: Glucose-Capillary: 214 mg/dL — ABNORMAL HIGH (ref 70–99)

## 2022-11-22 MED ORDER — MAGNESIUM OXIDE -MG SUPPLEMENT 400 (240 MG) MG PO TABS
400.0000 mg | ORAL_TABLET | ORAL | Status: DC
  Administered 2022-11-22 – 2022-11-24 (×2): 400 mg via ORAL
  Filled 2022-11-22 (×2): qty 1

## 2022-11-22 MED ORDER — ATORVASTATIN CALCIUM 40 MG PO TABS
40.0000 mg | ORAL_TABLET | Freq: Two times a day (BID) | ORAL | Status: DC
  Administered 2022-11-22 – 2022-11-26 (×9): 40 mg via ORAL
  Filled 2022-11-22 (×9): qty 1

## 2022-11-22 MED ORDER — PANTOPRAZOLE SODIUM 20 MG PO TBEC
20.0000 mg | DELAYED_RELEASE_TABLET | Freq: Every day | ORAL | Status: DC
  Administered 2022-11-22 – 2022-11-26 (×5): 20 mg via ORAL
  Filled 2022-11-22 (×5): qty 1

## 2022-11-22 MED ORDER — CLOPIDOGREL BISULFATE 75 MG PO TABS
75.0000 mg | ORAL_TABLET | Freq: Every day | ORAL | Status: DC
  Administered 2022-11-22 – 2022-11-26 (×5): 75 mg via ORAL
  Filled 2022-11-22 (×5): qty 1

## 2022-11-22 MED ORDER — LABETALOL HCL 5 MG/ML IV SOLN: 5.0000 mg | INTRAVENOUS | Status: DC | PRN

## 2022-11-22 MED ORDER — SODIUM CHLORIDE 0.9% FLUSH
3.0000 mL | Freq: Two times a day (BID) | INTRAVENOUS | Status: DC
  Administered 2022-11-22 – 2022-11-25 (×7): 3 mL via INTRAVENOUS

## 2022-11-22 MED ORDER — GLIMEPIRIDE 4 MG PO TABS
4.0000 mg | ORAL_TABLET | Freq: Two times a day (BID) | ORAL | Status: DC
  Filled 2022-11-22 (×2): qty 1

## 2022-11-22 MED ORDER — ACETAMINOPHEN 325 MG PO TABS: 650.0000 mg | ORAL_TABLET | ORAL | Status: DC | PRN

## 2022-11-22 MED ORDER — MIDAZOLAM HCL 2 MG/2ML IJ SOLN: 2.0000 mg | INTRAMUSCULAR | Status: DC | PRN

## 2022-11-22 MED ORDER — NON FORMULARY: 100.0000 mg | Freq: Every day | Status: DC

## 2022-11-22 MED ORDER — FERROUS SULFATE 325 (65 FE) MG PO TABS
325.0000 mg | ORAL_TABLET | ORAL | Status: DC
  Administered 2022-11-23 – 2022-11-25 (×2): 325 mg via ORAL
  Filled 2022-11-22 (×2): qty 1

## 2022-11-22 MED ORDER — MELATONIN 5 MG PO TABS
5.0000 mg | ORAL_TABLET | Freq: Every day | ORAL | Status: DC
  Administered 2022-11-22 – 2022-11-25 (×3): 5 mg via ORAL
  Filled 2022-11-22 (×3): qty 1

## 2022-11-22 MED ORDER — ENOXAPARIN SODIUM 40 MG/0.4ML IJ SOSY
40.0000 mg | PREFILLED_SYRINGE | INTRAMUSCULAR | Status: DC
  Administered 2022-11-22: 40 mg via SUBCUTANEOUS

## 2022-11-22 MED ORDER — VITAMIN B-12 100 MCG PO TABS
500.0000 ug | ORAL_TABLET | Freq: Every day | ORAL | Status: DC
  Administered 2022-11-22 – 2022-11-26 (×5): 500 ug via ORAL
  Filled 2022-11-22 (×5): qty 5

## 2022-11-22 MED ORDER — RAMIPRIL 5 MG PO CAPS
10.0000 mg | ORAL_CAPSULE | Freq: Every day | ORAL | Status: DC
  Administered 2022-11-22 – 2022-11-26 (×5): 10 mg via ORAL
  Filled 2022-11-22 (×5): qty 2

## 2022-11-22 MED ORDER — GLIMEPIRIDE 4 MG PO TABS
4.0000 mg | ORAL_TABLET | Freq: Two times a day (BID) | ORAL | Status: DC
  Administered 2022-11-22 – 2022-11-26 (×8): 4 mg via ORAL
  Filled 2022-11-22 (×9): qty 1

## 2022-11-22 MED ORDER — LINAGLIPTIN 5 MG PO TABS
5.0000 mg | ORAL_TABLET | Freq: Every day | ORAL | Status: DC
  Administered 2022-11-22 – 2022-11-26 (×5): 5 mg via ORAL
  Filled 2022-11-22 (×5): qty 1

## 2022-11-22 MED ORDER — ACETAMINOPHEN 650 MG RE SUPP: 650.0000 mg | RECTAL | Status: DC | PRN

## 2022-11-22 NOTE — TOC CM/SW Note (Signed)
Transition of Care [facility] - Inpatient Brief Assessment   Patient Details  Name: Dustin Crane MRN: [id number] Date of Birth: 1940-07-08  Transition of Care [facility]) CM/SW Contact:    Kermit Balo, RN Phone Number: 11/22/2022, 10:56 AM   Clinical Narrative:  Pt admitted to EMU.  Transition of Care Asessment: Insurance and Status: Insurance coverage has been reviewed Patient has primary care physician: Yes Home environment has been reviewed: home with spouse   Prior/Current Home Services: No current home services Social Determinants of Health Reivew: SDOH reviewed no interventions necessary Readmission risk has been reviewed: Yes Transition of care needs: no transition of care needs at this time

## 2022-11-22 NOTE — H&P (Signed)
CC: seizure  History is obtained from: patient, chart review  HPI: Dustin Crane is a 82 y.o. 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  Patient and wife state for the last couple of years he has had episodes where he is talking but the words he says does not make sense.  Per wife, she sometimes notices that he coughs during these episodes and will continue with activity he was doing.  These episodes can last for 10 seconds to a couple of minutes.  After the episode ends, patient is usually unaware that he had an episode but it takes him about 20 to 25 minutes to get back to baseline per wife who is also a Engineer, civil (consulting).  Patient states he usually does not have any warning signs except the most recent episode which was a couple of weeks ago when he felt something was not right followed by the episode.  He was started on Keppra which was discontinued due to excessive drowsiness.  He is currently on lamotrigine.  He was still having about 1 episode every month.  Denies any history of generalized tonic-clonic seizures.  Neurology note from 05/28/2022: "He had just been in significant pain and then perhaps relaxed over to the side that appeared to be stretching his right arm out and possibly turning his head towards the right side. Unclear if he had right gaze deviation or not. He then had 3 to 4 minutes of generalized tonic-clonic activity"   Neurology note 02/28/2021: "p/w acute onset expressive aphasia. On exam he has a right facial droop, with expresive aphasia. He is frustated when speaking and unable to expressive his thought. Frequent anomia, paraphasic and phonemic errors. Intermittent nonsenical speech"  Neurology note 07/25/2021: "presents with an episode of decreased speech.  He was out  shopping with his wife when he suddenly had complete behavioral arrest.  She asked him are you having trouble speaking, and he was able to nod.  He was able to move, and she was able to get him to the hospital.  Once arrived at the hospital, he improved."    EEG 05/28/2020: This is a normal EEG for age. There were no clinical seizures or epileptiform discharges noted during study.   ROS: All other systems reviewed and negative except as noted in the HPI.   Past Medical History:  Diagnosis Date   Arthritis    Cancer (HCC)    skin cancer with tags removed   Coronary artery disease    Diabetes mellitus without complication (HCC)    GERD (gastroesophageal reflux disease)    Hyperlipemia    Hypertension    Myocardial infarction [facility]) 2004   2 stents   Peripheral vascular disease (HCC)    TIA (transient ischemic attack) 10/2018   PATIENT DENIES THIS DIAGNOSIS   Wears glasses    Wears hearing aid    both ears    Family History  Problem Relation Age of Onset   Coronary artery disease Mother    Diabetes Mother    Alzheimer's disease Mother    Heart disease Father    Diabetes Father     Social History:  reports that he quit smoking about 46 years ago. His smoking use included cigarettes. He has never used smokeless tobacco. He reports current alcohol use. He reports that he does  not use drugs.   Medications Prior to Admission  Medication Sig Dispense Refill Last Dose   atorvastatin (LIPITOR) 40 MG tablet Take 40 mg by mouth in the morning and at bedtime.      cholecalciferol (VITAMIN D3) 25 MCG (1000 UNIT) tablet Take 1,000 Units by mouth daily.      ferrous sulfate 325 (65 FE) MG tablet Take 325 mg by mouth 3 (three) times a week.      glimepiride (AMARYL) 4 MG tablet Take 4 mg by mouth 2 (two) times daily.      levETIRAcetam (KEPPRA) 500 MG tablet Take 1 tablet (500 mg total) by mouth 2 (two) times daily. 60 tablet 0    magnesium oxide (MAG-OX) 400 (240 Mg) MG tablet Take 1 tablet  by mouth daily.      metFORMIN (GLUCOPHAGE) 500 MG tablet Take 500 mg by mouth 2 (two) times daily with a meal.      methocarbamol (ROBAXIN) 500 MG tablet       Multiple Vitamins-Minerals (MULTIVITAMIN WITH MINERALS) tablet Take 0.5 tablets by mouth 2 (two) times daily.      omeprazole (PRILOSEC) 20 MG capsule Take 20 mg by mouth 2 (two) times daily.      pioglitazone (ACTOS) 30 MG tablet Take 30 mg by mouth daily.      ramipril (ALTACE) 10 MG capsule Hold while you are taking Bactrim.  Can resume afterwards. (Patient taking differently: Take 10 mg by mouth daily.)      sitaGLIPtin (JANUVIA) 100 MG tablet Take 100 mg by mouth daily.      tadalafil (CIALIS) 5 MG tablet Take 5 mg by mouth daily.         Exam: Current vital signs: BP 128/67   Pulse 62   Temp 98.6 F (37 C)   SpO2 97%  Vital signs in last 24 hours: Temp:  [98.6 F (37 C)] 98.6 F (37 C) (09/16 0855) Pulse Rate:  [62] 62 (09/16 0855) BP: (128)/(67) 128/67 (09/16 0855) SpO2:  [97 %] 97 % (09/16 0855)   Physical Exam  Constitutional: Appears well-developed and well-nourished.  Psych: Affect appropriate to situation Neuro:    I have reviewed labs in epic and the results pertinent to this consultation are: CBC: No results for input(s): "WBC", "NEUTROABS", "HGB", "HCT", "MCV", "PLT" in the last 168 hours.  Basic Metabolic Panel:  Lab Results  Component Value Date   NA 137 07/25/2021   K 4.1 07/25/2021   CO2 27 07/25/2021   GLUCOSE 249 (H) 07/25/2021   BUN 18 07/25/2021   CREATININE 0.95 07/25/2021   CALCIUM 8.4 (L) 07/25/2021   GFRNONAA >60 07/25/2021   Lipid Panel:  Lab Results  Component Value Date   LDLCALC 51 03/01/2021   HgbA1c:  Lab Results  Component Value Date   HGBA1C 7.1 (H) 03/01/2021   Urine Drug Screen:     Component Value Date/Time   LABOPIA NONE DETECTED 05/27/2020 1318   COCAINSCRNUR NONE DETECTED 05/27/2020 1318   LABBENZ NONE DETECTED 05/27/2020 1318   AMPHETMU NONE DETECTED  05/27/2020 1318   THCU NONE DETECTED 05/27/2020 1318   LABBARB NONE DETECTED 05/27/2020 1318    Alcohol Level No results found for: "ETH"   I have reviewed the images obtained:  MR Brain wo contrast 07/25/2021: No acute infarction, hemorrhage, or mass. S There are small chronic infarcts of the left parietal lobe, right frontal central white matter, and posterior right lentiform nucleus. No substantial change from the  prior study.  ASSESSMENT/PLAN: 82 year old male with prior left parietal stroke now with episodes of aphasia which are very concerning for focal seizures.  Seizure -Start video EEG monitoring for characterization of spells -Will hold lamotrigine for seizure provocation - Plan for HV, photic stimulation and sleep deprivation tomorrow for seizure provocation -Continue seizure precautions -As needed IV Versed for clinical seizures  Hypertension  -continue home medications  Hyperlipidemia - continue home medications  Diabetes -Continue home medications  GERD -Continue home medications  Coronary artery disease -Continue home medications   Kailyn Vanderslice Epilepsy [facility]

## 2022-11-22 NOTE — Progress Notes (Signed)
LTM/EMU EEG hooked up and running - no initial skin breakdown - push button tested - [facility] monitoring.

## 2022-11-23 DIAGNOSIS — R569 Unspecified convulsions: Secondary | ICD-10-CM | POA: Diagnosis not present

## 2022-11-23 LAB — RAPID URINE DRUG SCREEN, HOSP PERFORMED
Amphetamines: NOT DETECTED
Barbiturates: NOT DETECTED
Benzodiazepines: NOT DETECTED
Cocaine: NOT DETECTED
Opiates: NOT DETECTED
Tetrahydrocannabinol: NOT DETECTED

## 2022-11-23 LAB — LAMOTRIGINE LEVEL: Lamotrigine Lvl: 4.2 ug/mL (ref 2.0–20.0)

## 2022-11-23 NOTE — Progress Notes (Signed)
Subjective: No acute events overnight.  No seizures.  Denies any concerns.  ROS: negative except above  Examination  Vital signs in last 24 hours: Temp:  [97.6 F (36.4 C)-98.1 F (36.7 C)] 98 F (36.7 C) (09/17 1136) Pulse Rate:  [52-63] 63 (09/17 1136) Resp:  [15-18] 18 (09/17 1136) BP: (122-143)/(62-70) 132/62 (09/17 1136) SpO2:  [96 %-99 %] 99 % (09/17 1136)  General: lying in bed, NAD  Neuro: MS: Alert, oriented, follows commands CN: pupils equal and reactive,  EOMI, face symmetric, tongue midline, normal sensation over face, Motor: 5/5 strength in all 4 extremities Coordination: normal Gait: not tested  Basic Metabolic Panel: Recent Labs  Lab 11/22/22 1038  NA 138  K 4.3  CL 104  CO2 25  GLUCOSE 169*  BUN 18  CREATININE 0.92  CALCIUM 9.0  MG 1.3*  PHOS 3.9    CBC: Recent Labs  Lab 11/22/22 1038  WBC 9.0  NEUTROABS 6.2  HGB 11.6*  HCT 32.8*  MCV 87.2  PLT 177     Coagulation Studies: Recent Labs    11/22/22 1038  LABPROT 13.5  INR 1.0    Imaging No new brain imaging overnight   ASSESSMENT AND PLAN:  82 year old male with prior left parietal stroke now with episodes of aphasia which are very concerning for focal seizures.   Seizure -Continue video EEG monitoring for characterization of spells -Continue to hold lamotrigine for seizure provocation - HV, photic stimulation and sleep deprivation today for seizure provocation -Discussed overnight EEG findings -Continue seizure precautions -As needed IV Versed for clinical seizures   Hypertension  -continue home medications   Hyperlipidemia - continue home medications   Diabetes -Continue home medications   GERD -Continue home medications   Coronary artery disease -Continue home medications  I have spent a total of  36  minutes with the patient reviewing hospital notes,  test results, labs and examining the patient as well as establishing an assessment and plan that was  discussed personally with the patient.  > 50% of time was spent in direct patient care.          Lindie Spruce Epilepsy [facility] For questions after 5pm please refer to AMION to reach the Neurologist on call

## 2022-11-23 NOTE — Procedures (Signed)
Patient Name: Dustin Crane  MRN: [id number]  Epilepsy Attending: Charlsie Quest  Referring Physician/Provider: Charlsie Quest, MD  Duration: 11/22/2022 1016 to 11/23/2022 1016  Patient history:  82 year old male with prior left parietal stroke now with episodes of aphasia which are very concerning for focal seizures. EEG to evaluate for seizure  Level of alertness: Awake, asleep  AEDs during EEG study: None  Technical aspects: This EEG study was done with scalp electrodes positioned according to the 10-20 International system of electrode placement. Electrical activity was reviewed with band pass filter of 1-70Hz , sensitivity of 7 uV/mm, display speed of 49mm/sec with a 60Hz  notched filter applied as appropriate. EEG data were recorded continuously and digitally stored.  Video monitoring was available and reviewed as appropriate.  Description: The posterior dominant rhythm consists of 8-9 Hz activity of moderate voltage (25-35 uV) seen predominantly in posterior head regions, symmetric and reactive to eye opening and eye closing. Sleep was characterized by vertex waves, sleep spindles (12 to 14 Hz), maximal frontocentral region. Hyperventilation and photic stimulation were not performed.     IMPRESSION: This study is within normal limits. No seizures or epileptiform discharges were seen throughout the recording.  A normal interictal EEG does not exclude the diagnosis of epilepsy.  Franceen Erisman [name]

## 2022-11-24 DIAGNOSIS — R569 Unspecified convulsions: Secondary | ICD-10-CM | POA: Diagnosis not present

## 2022-11-24 NOTE — Progress Notes (Signed)
Maint not done, pt sleeping. Study checked, looks ok, but may need maint in morning

## 2022-11-24 NOTE — Progress Notes (Signed)
Pt was awake till about 03:30 and slept thereafter, no seizure activity observed, pt reassured, family at bedside, will continue to monitor. Obasogie-Asidi, Shermaine Rivet Efe

## 2022-11-24 NOTE — Plan of Care (Signed)

## 2022-11-24 NOTE — Progress Notes (Signed)
LTM maint complete - no skin breakdown under: ZO1,W9,U0

## 2022-11-24 NOTE — Progress Notes (Signed)
Subjective: NAEO.   ROS: negative except above  Examination  Vital signs in last 24 hours: Temp:  [97.8 F (36.6 C)-98.2 F (36.8 C)] 98.2 F (36.8 C) (09/18 0730) Pulse Rate:  [63-67] 65 (09/18 0730) Resp:  [17-18] 18 (09/18 0730) BP: (103-159)/(52-78) 159/78 (09/18 0730) SpO2:  [96 %-99 %] 98 % (09/18 0730)  General: lying in bed, NAD CVS: pulse-normal rate and rhythm RS: breathing comfortably Extremities: normal   Neuro: MS: Alert, oriented, follows commands CN: pupils equal and reactive,  EOMI, face symmetric, tongue midline, normal sensation over face, Motor: 5/5 strength in all 4 extremities Coordination: normal Gait: not tested  Basic Metabolic Panel: Recent Labs  Lab 11/22/22 1038  NA 138  K 4.3  CL 104  CO2 25  GLUCOSE 169*  BUN 18  CREATININE 0.92  CALCIUM 9.0  MG 1.3*  PHOS 3.9    CBC: Recent Labs  Lab 11/22/22 1038  WBC 9.0  NEUTROABS 6.2  HGB 11.6*  HCT 32.8*  MCV 87.2  PLT 177     Coagulation Studies: Recent Labs    11/22/22 1038  LABPROT 13.5  INR 1.0    Imaging No new brain imaging overnight     ASSESSMENT AND PLAN:  82 year old male with prior left parietal stroke now with episodes of aphasia which are very concerning for focal seizures.   Seizure -Continue video EEG monitoring for characterization of spells -Continue to hold lamotrigine for seizure provocation -Discussed overnight EEG findings and answered questions -Requested to sit in the recliner.  Patient does not have any GTC seizures.  Therefore will allow being in the chair/recliner for few hours today. -Continue seizure precautions -As needed IV Versed for clinical seizures   Hypertension  -continue home medications   Hyperlipidemia - continue home medications   Diabetes -Continue home medications   GERD -Continue home medications   Coronary artery disease -Continue home medications   I have spent a total of  35 minutes with the patient reviewing  hospital notes,  test results, labs and examining the patient as well as establishing an assessment and plan that was discussed personally with the patient.  > 50% of time was spent in direct patient care.  Lindie Spruce Epilepsy [facility] For questions after 5pm please refer to AMION to reach the Neurologist on call

## 2022-11-24 NOTE — Procedures (Signed)
Patient Name: CARLISLE PASZKIEWICZ  MRN: [id number]  Epilepsy Attending: Charlsie Quest  Referring Physician/Provider: Charlsie Quest, MD  Duration: 11/22/2022 1016 to 11/23/2022 1016   Patient history:  82 year old male with prior left parietal stroke now with episodes of aphasia which are very concerning for focal seizures. EEG to evaluate for seizure   Level of alertness: Awake, asleep   AEDs during EEG study: None   Technical aspects: This EEG study was done with scalp electrodes positioned according to the 10-20 International system of electrode placement. Electrical activity was reviewed with band pass filter of 1-70Hz , sensitivity of 7 uV/mm, display speed of 60mm/sec with a 60Hz  notched filter applied as appropriate. EEG data were recorded continuously and digitally stored.  Video monitoring was available and reviewed as appropriate.   Description: The posterior dominant rhythm consists of 8-9 Hz activity of moderate voltage (25-35 uV) seen predominantly in posterior head regions, symmetric and reactive to eye opening and eye closing. Sleep was characterized by vertex waves, sleep spindles (12 to 14 Hz), maximal frontocentral region.  Physiologic photic driving was not seen during photic stimulation.  No EEG change was seen during hyperventilation.  IMPRESSION: This study is within normal limits. No seizures or epileptiform discharges were seen throughout the recording.   A normal interictal EEG does not exclude the diagnosis of epilepsy.   Ahsan Esterline [name]

## 2022-11-25 DIAGNOSIS — R569 Unspecified convulsions: Secondary | ICD-10-CM | POA: Diagnosis not present

## 2022-11-25 MED ORDER — LAMOTRIGINE 100 MG PO TABS
100.0000 mg | ORAL_TABLET | Freq: Two times a day (BID) | ORAL | Status: DC
  Administered 2022-11-25 – 2022-11-26 (×2): 100 mg via ORAL
  Filled 2022-11-25 (×2): qty 1

## 2022-11-25 NOTE — TOC Progression Note (Signed)
Transition of Care [facility]) - Progression Note    Patient Details  Name: Dustin Crane MRN: [id number] Date of Birth: [birth year]  [facility] CM/SW Contact  Kermit Balo, RN Phone Number: 11/25/2022, 1:03 PM  Clinical Narrative:     Continues with EMU. TOC following  Expected Discharge Plan: Home/Self Care    Expected Discharge Plan and Services                                               Social Determinants of Health (SDOH) Interventions SDOH Screenings   Food Insecurity: No Food Insecurity (11/22/2022)  Housing: Low Risk  (11/22/2022)  Transportation Needs: No Transportation Needs (11/22/2022)  Utilities: Not At Risk (11/22/2022)  Tobacco Use: Medium Risk (11/22/2022)    Readmission Risk Interventions     No data to display

## 2022-11-25 NOTE — Care Management Important Message (Signed)
Important Message  Patient Details  Name: Dustin Crane MRN: [id number] Date of Birth: Jun 18, 1940   Medicare Important Message Given:  Yes     Dorena Bodo 11/25/2022, 3:37 PM

## 2022-11-25 NOTE — Progress Notes (Signed)
vLTM maintenance  All impedances below 10kohms.  No skin breakdown noted at FP1  FP2  T4  T3

## 2022-11-25 NOTE — Progress Notes (Signed)
Subjective: No acute events overnight.  No new concerns.  ROS: negative except above  Examination  Vital signs in last 24 hours: Temp:  [97.2 F (36.2 C)-98.1 F (36.7 C)] 98.1 F (36.7 C) (09/19 1247) Pulse Rate:  [59-77] 77 (09/19 1247) Resp:  [18] 18 (09/19 1247) BP: (89-143)/(50-76) 117/67 (09/19 1247) SpO2:  [95 %-99 %] 96 % (09/19 1247)  General: lying in bed, NAD Neuro: MS: Alert, oriented, follows commands CN: pupils equal and reactive,  EOMI, face symmetric, tongue midline, normal sensation over face, Motor: 5/5 strength in all 4 extremities Coordination: normal Gait: not tested  Basic Metabolic Panel: Recent Labs  Lab 11/22/22 1038  NA 138  K 4.3  CL 104  CO2 25  GLUCOSE 169*  BUN 18  CREATININE 0.92  CALCIUM 9.0  MG 1.3*  PHOS 3.9    CBC: Recent Labs  Lab 11/22/22 1038  WBC 9.0  NEUTROABS 6.2  HGB 11.6*  HCT 32.8*  MCV 87.2  PLT 177     Coagulation Studies: No results for input(s): "LABPROT", "INR" in the last 72 hours.  Imaging No new brain imaging overnight     ASSESSMENT AND PLAN:  82 year old male with prior left parietal stroke now with episodes of aphasia which are very concerning for focal seizures.   Seizure -Continue video EEG monitoring for characterization of spells -Discussed overnight EEG findings and answered questions -Will resume lamotrigine from tonight. -Continue seizure precautions -As needed IV Versed for clinical seizures   Hypertension  -continue home medications   Hyperlipidemia - continue home medications   Diabetes -Continue home medications   GERD -Continue home medications   Coronary artery disease -Continue home medications   I have spent a total of  26 minutes with the patient reviewing hospital notes,  test results, labs and examining the patient as well as establishing an assessment and plan that was discussed personally with the patient.  > 50% of time was spent in direct patient care.     Lindie Spruce Epilepsy [facility] For questions after 5pm please refer to AMION to reach the Neurologist on call

## 2022-11-25 NOTE — Procedures (Signed)
Patient Name: JASIR URBANOWICZ  MRN: [id number]  Epilepsy Attending: Charlsie Quest  Referring Physician/Provider: Charlsie Quest, MD  Duration: 11/22/2022 1016 to 11/23/2022 1016   Patient history:  82 year old male with prior left parietal stroke now with episodes of aphasia which are very concerning for focal seizures. EEG to evaluate for seizure   Level of alertness: Awake, asleep   AEDs during EEG study: None   Technical aspects: This EEG study was done with scalp electrodes positioned according to the 10-20 International system of electrode placement. Electrical activity was reviewed with band pass filter of 1-70Hz , sensitivity of 7 uV/mm, display speed of 2mm/sec with a 60Hz  notched filter applied as appropriate. EEG data were recorded continuously and digitally stored.  Video monitoring was available and reviewed as appropriate.   Description: The posterior dominant rhythm consists of 8-9 Hz activity of moderate voltage (25-35 uV) seen predominantly in posterior head regions, symmetric and reactive to eye opening and eye closing. Sleep was characterized by vertex waves, sleep spindles (12 to 14 Hz), maximal frontocentral region.  Sharp transients were noted in left temporo-parietal region.   IMPRESSION: This study is within normal limits. No seizures or definite epileptiform discharges were seen throughout the recording.   A normal interictal EEG does not exclude the diagnosis of epilepsy.   Adell Panek [name]

## 2022-11-25 NOTE — Plan of Care (Signed)

## 2022-11-25 NOTE — Progress Notes (Addendum)
EMU maint complete - no skin breakdown under: Fp1 Fp2 , applied pasted to several leads [facility] monitored, confirmed by [facility].

## 2022-11-26 ENCOUNTER — Other Ambulatory Visit: Payer: Self-pay

## 2022-11-26 ENCOUNTER — Encounter (HOSPITAL_COMMUNITY): Payer: Self-pay

## 2022-11-26 ENCOUNTER — Emergency Department (HOSPITAL_COMMUNITY)
Admission: EM | Admit: 2022-11-26 | Discharge: 2022-11-26 | Disposition: A | Discharge status: Home / Self Care | Payer: Medicare Other | Attending: Emergency Medicine | Admitting: Emergency Medicine

## 2022-11-26 ENCOUNTER — Emergency Department (HOSPITAL_COMMUNITY): Payer: Medicare Other

## 2022-11-26 DIAGNOSIS — Z7902 Long term (current) use of antithrombotics/antiplatelets: Secondary | ICD-10-CM | POA: Insufficient documentation

## 2022-11-26 DIAGNOSIS — I2510 Atherosclerotic heart disease of native coronary artery without angina pectoris: Secondary | ICD-10-CM | POA: Insufficient documentation

## 2022-11-26 DIAGNOSIS — Z7984 Long term (current) use of oral hypoglycemic drugs: Secondary | ICD-10-CM | POA: Diagnosis not present

## 2022-11-26 DIAGNOSIS — W19XXXA Unspecified fall, initial encounter: Secondary | ICD-10-CM

## 2022-11-26 DIAGNOSIS — D649 Anemia, unspecified: Secondary | ICD-10-CM | POA: Insufficient documentation

## 2022-11-26 DIAGNOSIS — E119 Type 2 diabetes mellitus without complications: Secondary | ICD-10-CM | POA: Diagnosis not present

## 2022-11-26 DIAGNOSIS — S0990XA Unspecified injury of head, initial encounter: Secondary | ICD-10-CM | POA: Diagnosis present

## 2022-11-26 DIAGNOSIS — D72829 Elevated white blood cell count, unspecified: Secondary | ICD-10-CM | POA: Insufficient documentation

## 2022-11-26 DIAGNOSIS — I10 Essential (primary) hypertension: Secondary | ICD-10-CM | POA: Diagnosis not present

## 2022-11-26 DIAGNOSIS — W010XXA Fall on same level from slipping, tripping and stumbling without subsequent striking against object, initial encounter: Secondary | ICD-10-CM | POA: Insufficient documentation

## 2022-11-26 DIAGNOSIS — R569 Unspecified convulsions: Secondary | ICD-10-CM | POA: Diagnosis not present

## 2022-11-26 LAB — CBC WITH DIFFERENTIAL/PLATELET
Abs Immature Granulocytes: 0.12 10*3/uL — ABNORMAL HIGH (ref 0.00–0.07)
Basophils Absolute: 0 10*3/uL (ref 0.0–0.1)
Basophils Relative: 0 %
Eosinophils Absolute: 0.2 10*3/uL (ref 0.0–0.5)
Eosinophils Relative: 1 %
HCT: 36.2 % — ABNORMAL LOW (ref 39.0–52.0)
Hemoglobin: 12.5 g/dL — ABNORMAL LOW (ref 13.0–17.0)
Immature Granulocytes: 1 %
Lymphocytes Relative: 14 %
Lymphs Abs: 1.8 10*3/uL (ref 0.7–4.0)
MCH: 29.6 pg (ref 26.0–34.0)
MCHC: 34.5 g/dL (ref 30.0–36.0)
MCV: 85.8 fL (ref 80.0–100.0)
Monocytes Absolute: 1 10*3/uL (ref 0.1–1.0)
Monocytes Relative: 8 %
Neutro Abs: 9.5 10*3/uL — ABNORMAL HIGH (ref 1.7–7.7)
Neutrophils Relative %: 76 %
Platelets: 215 10*3/uL (ref 150–400)
RBC: 4.22 MIL/uL (ref 4.22–5.81)
RDW: 11.8 % (ref 11.5–15.5)
WBC: 12.5 10*3/uL — ABNORMAL HIGH (ref 4.0–10.5)
nRBC: 0 % (ref 0.0–0.2)

## 2022-11-26 LAB — COMPREHENSIVE METABOLIC PANEL
ALT: 23 U/L (ref 0–44)
AST: 18 U/L (ref 15–41)
Albumin: 3.5 g/dL (ref 3.5–5.0)
Alkaline Phosphatase: 80 U/L (ref 38–126)
Anion gap: 12 (ref 5–15)
BUN: 28 mg/dL — ABNORMAL HIGH (ref 8–23)
CO2: 22 mmol/L (ref 22–32)
Calcium: 8.5 mg/dL — ABNORMAL LOW (ref 8.9–10.3)
Chloride: 100 mmol/L (ref 98–111)
Creatinine, Ser: 1.07 mg/dL (ref 0.61–1.24)
GFR, Estimated: >60 mL/min (ref >60)
Glucose, Bld: 244 mg/dL — ABNORMAL HIGH (ref 70–99)
Potassium: 3.9 mmol/L (ref 3.5–5.1)
Sodium: 134 mmol/L — ABNORMAL LOW (ref 135–145)
Total Bilirubin: 0.8 mg/dL (ref 0.3–1.2)
Total Protein: 6.2 g/dL — ABNORMAL LOW (ref 6.5–8.1)

## 2022-11-26 LAB — I-STAT CHEM 8, ED
BUN: 29 mg/dL — ABNORMAL HIGH (ref 8–23)
Calcium, Ion: 1.13 mmol/L — ABNORMAL LOW (ref 1.15–1.40)
Chloride: 101 mmol/L (ref 98–111)
Creatinine, Ser: 1.1 mg/dL (ref 0.61–1.24)
Glucose, Bld: 239 mg/dL — ABNORMAL HIGH (ref 70–99)
HCT: 36 % — ABNORMAL LOW (ref 39.0–52.0)
Hemoglobin: 12.2 g/dL — ABNORMAL LOW (ref 13.0–17.0)
Potassium: 4 mmol/L (ref 3.5–5.1)
Sodium: 137 mmol/L (ref 135–145)
TCO2: 22 mmol/L (ref 22–32)

## 2022-11-26 MED ORDER — LAMOTRIGINE 100 MG PO TABS: 100.0000 mg | ORAL_TABLET | Freq: Two times a day (BID) | ORAL | 0 refills | Status: AC

## 2022-11-26 NOTE — Discharge Instructions (Signed)
You were admitted to epilepsy monitoring unit between 11/22/2022 to 11/26/2022.  During this time, your antiseizure medication was held.  Hyperventilation, photic stimulation and sleep deprivation were performed.  No typical events were recorded.  Your EEG was within normal limits.  However, a normal EEG does not exclude the diagnosis of epilepsy.  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  Amyloid spells can sometimes have similar presentation.  You reported that frequency of episodes is improving since being on lamotrigine and denied any side effects.  Therefore we increased lamotrigine to 100 mg twice daily.  He will continue to follow-up with Dr. Sherryll Burger at [place] [facility].  Also discussed seizure precautions including no driving.

## 2022-11-26 NOTE — Progress Notes (Signed)
Chaplain visited at bedside with [facility] and Alfredia Client discussing the mystery of what's happening with Rocky's health.  Acknowledged the mystery of God is something we're used to, but seems like these mystery episodes should have a reason that can be identified and treated. Chaplain prayed at bedside that the mystery of this ailment be found, thanksgiving for doctors who are trying to find the answer, for patience while waiting for an answer.  Vernell Morgans Chaplain

## 2022-11-26 NOTE — Discharge Summary (Signed)
Physician Discharge Summary  Patient ID: Dustin Crane MRN: [id number] DOB/AGE: Aug 20, 1940 82 y.o.  Admit date: 11/22/2022 Discharge date: 11/26/2022  Admission Diagnoses: seizure  Discharge Diagnoses: Principal Problem:   Seizure [facility])   Discharged Condition: stable  Hospital Course: Dustin Crane was admitted to epilepsy monitoring unit between 11/22/2022 to 11/26/2022.  During this time, antiseizure medications were held.  Hyperventilation, photic stimulation and sleep deprivation were performed for seizure provocation.  No typical events were recorded.  EEG was within normal limits.  Sharp transients were noted in left right occipital region but did not meet criteria for epileptiform discharges.   Differentials include epilepsy versus amyloid spells were discussed.  Patient reported improvement in symptoms since being on lamotrigine.  Also denied any side effects on lamotrigine.  Therefore increase lamotrigine to 100 mg twice daily.  Recommended follow-up with Dustin Crane at [place] [facility]. Seizure precautions including do not drive were discussed.  Consults: None  Significant Diagnostic Studies: Video EEG  Description: The posterior dominant rhythm consists of 8-9 Hz activity of moderate voltage (25-35 uV) seen predominantly in posterior head regions, symmetric and reactive to eye opening and eye closing. Sleep was characterized by vertex waves, sleep spindles (12 to 14 Hz), maximal frontocentral region.  Sharp transients were noted in left temporo-parietal region.   IMPRESSION: This study is within normal limits. No seizures or definite epileptiform discharges were seen throughout the recording.   A normal interictal EEG does not exclude the diagnosis of epilepsy.   Dustin Crane Dustin Crane    Treatments: Lamotrigine 100 mg twice daily  Discharge Exam: Blood pressure 121/69, pulse 73, temperature 98 F (36.7 C), temperature source Oral, resp. rate 17, SpO2 95%. General: lying in bed,  NAD Neuro: MS: Alert, oriented, follows commands CN: pupils equal and reactive,  EOMI, face symmetric, tongue midline, normal sensation over face, Motor: 5/5 strength in all 4 extremities Coordination: normal Gait: not tested  Discharge disposition: 01-Home or Self Care   Discharge Instructions     Call MD for:   Complete by: As directed    If patient has another seizure, call 911 and bring them back to the ED if: A.  The seizure lasts longer than 5 minutes.      B.  The patient doesn't wake shortly after the seizure or has new problems such as difficulty seeing, speaking or moving following the seizure C.  The patient was injured during the seizure D.  The patient has a temperature over 102 F (39C) E.  The patient vomited during the seizure and now is having trouble breathing      Diet - low sodium heart healthy   Complete by: As directed    Discharge instructions   Complete by: As directed    Seizure precautions: Per [facility] statutes, patients with seizures are not allowed to drive until they have been seizure-free for six months and cleared by a physician    Use caution when using heavy equipment or power tools. Avoid working on ladders or at heights. Take showers instead of baths. Ensure the water temperature is not too high on the home water heater. Do not go swimming alone. Do not lock yourself in a room alone (i.e. bathroom). When caring for infants or small children, sit down when holding, feeding, or changing them to minimize risk of injury to the child in the event you have a seizure. Maintain good sleep hygiene. Avoid alcohol.   Driving Restrictions   Complete by: As  directed    Do not drive until seizure-free for 6 months since last episode   Increase activity slowly   Complete by: As directed       Allergies as of 11/26/2022       Reactions   Haemophilus B Polysaccharide Vaccine Anaphylaxis, Shortness Of Breath, Rash   Rash-severe n/v/d   Haemophilus  Influenzae Anaphylaxis, Shortness Of Breath, Rash   Rash-severe n/v/d Ended up in hospital after taking!   Influenza Vac Split [influenza Virus Vaccine] Shortness Of Breath, Other (See Comments)   Rash-severe n/v/d   Hydroxyzine    Patient denies?????   Compazine [prochlorperazine Edisylate] Other (See Comments)   Muscle tightness        Medication List     TAKE these medications    atorvastatin 40 MG tablet Commonly known as: LIPITOR Take 40 mg by mouth in the morning and at bedtime.   clopidogrel 75 MG tablet Commonly known as: PLAVIX Take 75 mg by mouth at bedtime.   cyanocobalamin 500 MCG tablet Commonly known as: VITAMIN B12 Take 500 mcg by mouth daily.   ferrous sulfate 325 (65 FE) MG tablet Take 325 mg by mouth See admin instructions. Take 1 tablet by mouth in the evening on Sunday, Tuesday, Thursday, Saturday   glimepiride 4 MG tablet Commonly known as: AMARYL Take 4 mg by mouth 2 (two) times daily.   lamoTRIgine 100 MG tablet Commonly known as: LAMICTAL Take 1 tablet (100 mg total) by mouth 2 (two) times daily. What changed:  medication strength how much to take when to take this additional instructions   magnesium oxide 400 (240 Mg) MG tablet Commonly known as: MAG-OX Take 1 tablet by mouth daily.   omeprazole 20 MG capsule Commonly known as: PRILOSEC Take 20 mg by mouth 2 (two) times daily.   OVER THE COUNTER MEDICATION Take 1 tablet by mouth at bedtime. Vick's Zzzquil Pure Zzzs Melatonin (2mg ) plus chamomile and lavender   ramipril 10 MG capsule Commonly known as: ALTACE Hold while you are taking Bactrim.  Can resume afterwards. What changed:  how much to take how to take this when to take this additional instructions   sitaGLIPtin 100 MG tablet Commonly known as: JANUVIA Take 100 mg by mouth daily.   tadalafil 5 MG tablet Commonly known as: CIALIS Take 5 mg by mouth daily.        During the Seizure   - First, ensure adequate  ventilation and place patients on the floor on their left side  Loosen clothing around the neck and ensure the airway is patent. If the patient is clenching the teeth, do not force the mouth open with any object as this can cause severe damage - Remove all items from the surrounding that can be hazardous. The patient may be oblivious to what's happening and may not even know what he or she is doing. If the patient is confused and wandering, either gently guide him/her away and block access to outside areas - Reassure the individual and be comforting - Call 911. In most cases, the seizure ends before EMS arrives. However, there are cases when seizures may last over 3 to 5 minutes. Or the individual may have developed breathing difficulties or severe injuries. If a pregnant patient or a person with diabetes develops a seizure, it is prudent to call an ambulance.   After the Seizure (Postictal Stage)   After a seizure, most patients experience confusion, fatigue, muscle pain and/or a headache. Thus, one  should permit the individual to sleep. For the next few days, reassurance is essential. Being calm and helping reorient the person is also of importance.   Most seizures are painless and end spontaneously. Seizures are not harmful to others but can lead to complications such as stress on the lungs, brain and the heart. Individuals with prior lung problems may develop labored breathing and respiratory distress.   I have spent a total of  36  minutes with the patient reviewing hospital notes,  test results, labs and examining the patient as well as establishing an assessment and plan that was discussed personally with the patient.  > 50% of time was spent in direct patient care.         Signed: Charlsie Quest 11/26/2022, 10:25 AM

## 2022-11-26 NOTE — Progress Notes (Signed)
LTM EEG discontinued - no skin breakdown at [facility].

## 2022-11-26 NOTE — ED Notes (Signed)
Pt's wife at bedside now. She reports the pt stepped into the bedroom and was staring off at the ceiling and then bowed his head and stared at the floor. He then slipped on the wet floor and fell backwards, hitting the back of his head. Pt was not responding to her questions.

## 2022-11-26 NOTE — ED Triage Notes (Signed)
Per EMS and pt report, pt was Dc'd today from [facility]. Was admitted for seizures. Pt remembers taking a shower and then he was in his bedroom on the floor. The pt's wife reports he was staring off into space and fell when he got to the bedroom. She reports that he hit his head and is on blood thinners.

## 2022-11-26 NOTE — Procedures (Addendum)
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 EEG to evaluate for seizure   Level of alertness: Awake, asleep   AEDs during EEG study: None   Technical aspects: This EEG study was done with scalp electrodes positioned according to the 10-20 International system of electrode placement. Electrical activity was reviewed with band pass filter of 1-70Hz , sensitivity of 7 uV/mm, display speed of 7mm/sec with a 60Hz  notched filter applied as appropriate. EEG data were recorded continuously and digitally stored.  Video monitoring was available and reviewed as appropriate.   Description: The posterior dominant rhythm consists of 8-9 Hz activity of moderate voltage (25-35 uV) seen predominantly in posterior head regions, symmetric and reactive to eye opening and eye closing. Sleep was characterized by vertex waves, sleep spindles (12 to 14 Hz), maximal frontocentral region.  Sharp transients were noted in left temporo-parietal region.   IMPRESSION: This study is within normal limits. No seizures or definite epileptiform discharges were seen throughout the recording.   A normal interictal EEG does not exclude the diagnosis of epilepsy.   Yoselyn Mcglade [name]

## 2022-11-26 NOTE — Discharge Instructions (Signed)
You are seen today following a fall.  Your CT scan did not show any injuries inside your head.  Your labs did not show any cause of your fall.  We did speak with our neurology team.  They recommend continuing with your current dosing of lamotrigine 100 mg 2 times daily.

## 2022-11-26 NOTE — Progress Notes (Addendum)
Reviewed discharge instructions with pt and his wife Corrie Dandy. Elwyn Reach understood the instructions. Removed IV and Tele box signed in. Pt wheeled down to care by volunteers.

## 2022-11-26 NOTE — ED Provider Notes (Signed)
[facility] EMERGENCY DEPARTMENT AT [facility] Provider Note   CSN: [id number] Arrival date & time: 11/26/22  1755     History {Add pertinent medical, surgical, social history, OB history to HPI:1} Chief Complaint  Patient presents with   Dustin Crane is a 82 y.o. male.   Fall       Home Medications Prior to Admission medications   Medication Sig Start Date End Date Taking? Authorizing Provider  atorvastatin (LIPITOR) 40 MG tablet Take 40 mg by mouth in the morning and at bedtime.    [provider]  clopidogrel (PLAVIX) 75 MG tablet Take 75 mg by mouth at bedtime.    [provider]  cyanocobalamin (VITAMIN B12) 500 MCG tablet Take 500 mcg by mouth daily.    [provider]  ferrous sulfate 325 (65 FE) MG tablet Take 325 mg by mouth See admin instructions. Take 1 tablet by mouth in the evening on Sunday, Tuesday, Thursday, Saturday    [provider]  glimepiride (AMARYL) 4 MG tablet Take 4 mg by mouth 2 (two) times daily. 08/05/19   [provider]  lamoTRIgine (LAMICTAL) 100 MG tablet Take 1 tablet (100 mg total) by mouth 2 (two) times daily. 11/26/22   Charlsie Quest, MD  magnesium oxide (MAG-OX) 400 (240 Mg) MG tablet Take 1 tablet by mouth daily. 04/07/21   [provider]  omeprazole (PRILOSEC) 20 MG capsule Take 20 mg by mouth 2 (two) times daily.    [provider]  OVER THE COUNTER MEDICATION Take 1 tablet by mouth at bedtime. Vick's Zzzquil Pure Zzzs Melatonin (2mg ) plus chamomile and lavender    [provider]  ramipril (ALTACE) 10 MG capsule Hold while you are taking Bactrim.  Can resume afterwards. Patient taking differently: Take 10 mg by mouth daily. 06/09/20   Darlin Priestly, MD  sitaGLIPtin (JANUVIA) 100 MG tablet Take 100 mg by mouth daily.    [provider]  tadalafil (CIALIS) 5 MG tablet Take 5 mg by mouth daily. 06/20/21   [provider]       Allergies    Haemophilus b polysaccharide vaccine, Haemophilus influenzae, Influenza vac split [influenza virus vaccine], Hydroxyzine, and Compazine [prochlorperazine edisylate]    Review of Systems   Review of Systems  Physical Exam Updated Vital Signs Temp 98.3 F (36.8 C) (Oral)   Ht 5\' 9"  (1.753 m)   Wt 76.2 kg   BMI 24.81 kg/m  Physical Exam  ED Results / Procedures / Treatments   Labs (all labs ordered are listed, but only abnormal results are displayed) Labs Reviewed - No data to display  EKG None  Radiology No results found.  Procedures Procedures  {Document cardiac monitor, telemetry assessment procedure when appropriate:1}  Medications Ordered in ED Medications - No data to display  ED Course/ Medical Decision Making/ A&P   {   Click here for ABCD2, HEART and other calculatorsREFRESH Note before signing :1}                              Medical Decision Making  ***  {Document critical care time when appropriate:1} {Document review of labs and clinical decision tools ie heart score, Chads2Vasc2 etc:1}  {Document your independent review of radiology images, and any outside records:1} {Document your discussion with family members, caretakers, and with consultants:1} {Document social determinants of health affecting pt's care:1} {Document your  decision making why or why not admission, treatments were needed:1} Final Clinical Impression(s) / ED Diagnoses Final diagnoses:  None    Rx / DC Orders ED Discharge Orders     None

## 2022-11-26 NOTE — TOC Transition Note (Signed)
Transition of Care [facility]) - CM/SW Discharge Note   Patient Details  Name: Dustin Crane MRN: [id number] Date of Birth: [birth year]  [facility]) CM/SW Contact:  Kermit Balo, RN Phone Number: 11/26/2022, 10:26 AM   Clinical Narrative:     Pt is discharging to CIR today. CM signing off.   Final next level of care: Home/Self Care Barriers to Discharge: No Barriers Identified   Patient Goals and CMS Choice      Discharge Placement                         Discharge Plan and Services Additional resources added to the After Visit Summary for                                       Social Determinants of Health (SDOH) Interventions SDOH Screenings   Food Insecurity: No Food Insecurity (11/22/2022)  Housing: Low Risk  (11/22/2022)  Transportation Needs: No Transportation Needs (11/22/2022)  Utilities: Not At Risk (11/22/2022)  Tobacco Use: Medium Risk (11/22/2022)     Readmission Risk Interventions     No data to display

## 2023-10-03 ENCOUNTER — Other Ambulatory Visit: Payer: Self-pay | Admitting: Internal Medicine

## 2023-10-03 DIAGNOSIS — R634 Abnormal weight loss: Secondary | ICD-10-CM

## 2023-10-11 ENCOUNTER — Ambulatory Visit
Admission: RE | Admit: 2023-10-11 | Discharge: 2023-10-11 | Disposition: A | Discharge status: Home / Self Care | Source: Ambulatory Visit | Attending: Internal Medicine | Admitting: Internal Medicine

## 2023-10-11 DIAGNOSIS — R634 Abnormal weight loss: Secondary | ICD-10-CM | POA: Insufficient documentation

## 2023-10-11 MED ORDER — IOHEXOL 300 MG/ML  SOLN
80.0000 mL | Freq: Once | INTRAMUSCULAR | Status: AC | PRN
  Administered 2023-10-11: 80 mL via INTRAVENOUS

## 2023-10-29 ENCOUNTER — Other Ambulatory Visit: Payer: Self-pay

## 2023-10-29 ENCOUNTER — Emergency Department
Admission: EM | Admit: 2023-10-29 | Discharge: 2023-10-29 | Disposition: A | Discharge status: Home / Self Care | Attending: Emergency Medicine | Admitting: Emergency Medicine

## 2023-10-29 DIAGNOSIS — R569 Unspecified convulsions: Secondary | ICD-10-CM | POA: Insufficient documentation

## 2023-10-29 DIAGNOSIS — G309 Alzheimer's disease, unspecified: Secondary | ICD-10-CM | POA: Insufficient documentation

## 2023-10-29 LAB — URINALYSIS, ROUTINE W REFLEX MICROSCOPIC
Bacteria, UA: NONE SEEN
Bilirubin Urine: NEGATIVE
Glucose, UA: >=500 mg/dL — AB
Hgb urine dipstick: NEGATIVE
Ketones, ur: NEGATIVE mg/dL
Leukocytes,Ua: NEGATIVE
Nitrite: NEGATIVE
Protein, ur: NEGATIVE mg/dL
Specific Gravity, Urine: 1.021 (ref 1.005–1.030)
pH: 9 — ABNORMAL HIGH (ref 5.0–8.0)

## 2023-10-29 LAB — CBC WITH DIFFERENTIAL/PLATELET
Abs Immature Granulocytes: 0.05 K/uL (ref 0.00–0.07)
Basophils Absolute: 0 K/uL (ref 0.0–0.1)
Basophils Relative: 0 %
Eosinophils Absolute: 0.1 K/uL (ref 0.0–0.5)
Eosinophils Relative: 1 %
HCT: 39.3 % (ref 39.0–52.0)
Hemoglobin: 13.4 g/dL (ref 13.0–17.0)
Immature Granulocytes: 1 %
Lymphocytes Relative: 21 %
Lymphs Abs: 1.7 K/uL (ref 0.7–4.0)
MCH: 29.6 pg (ref 26.0–34.0)
MCHC: 34.1 g/dL (ref 30.0–36.0)
MCV: 86.9 fL (ref 80.0–100.0)
Monocytes Absolute: 0.6 K/uL (ref 0.1–1.0)
Monocytes Relative: 7 %
Neutro Abs: 5.6 K/uL (ref 1.7–7.7)
Neutrophils Relative %: 70 %
Platelets: 214 K/uL (ref 150–400)
RBC: 4.52 MIL/uL (ref 4.22–5.81)
RDW: 12.3 % (ref 11.5–15.5)
WBC: 8 K/uL (ref 4.0–10.5)
nRBC: 0 % (ref 0.0–0.2)

## 2023-10-29 LAB — COMPREHENSIVE METABOLIC PANEL WITH GFR
ALT: 43 U/L (ref 0–44)
AST: 28 U/L (ref 15–41)
Albumin: 4.2 g/dL (ref 3.5–5.0)
Alkaline Phosphatase: 77 U/L (ref 38–126)
Anion gap: 10 (ref 5–15)
BUN: 26 mg/dL — ABNORMAL HIGH (ref 8–23)
CO2: 28 mmol/L (ref 22–32)
Calcium: 9.3 mg/dL (ref 8.9–10.3)
Chloride: 101 mmol/L (ref 98–111)
Creatinine, Ser: 0.79 mg/dL (ref 0.61–1.24)
GFR, Estimated: >60 mL/min (ref >60)
Glucose, Bld: 207 mg/dL — ABNORMAL HIGH (ref 70–99)
Potassium: 4.4 mmol/L (ref 3.5–5.1)
Sodium: 139 mmol/L (ref 135–145)
Total Bilirubin: 0.7 mg/dL (ref 0.0–1.2)
Total Protein: 6.9 g/dL (ref 6.5–8.1)

## 2023-10-29 LAB — MAGNESIUM: Magnesium: 1.9 mg/dL (ref 1.7–2.4)

## 2023-10-29 MED ORDER — LAMOTRIGINE 25 MG PO TABS
50.0000 mg | ORAL_TABLET | Freq: Once | ORAL | Status: AC
  Administered 2023-10-29: 50 mg via ORAL
  Filled 2023-10-29: qty 2

## 2023-10-29 MED ORDER — LAMOTRIGINE 100 MG PO TABS
100.0000 mg | ORAL_TABLET | Freq: Once | ORAL | Status: AC
  Administered 2023-10-29: 100 mg via ORAL
  Filled 2023-10-29: qty 1

## 2023-10-29 NOTE — ED Provider Notes (Signed)
 [facility] Provider Note    Event Date/Time   First MD Initiated Contact with Patient 10/29/23 1106     (approximate)   History   Seizures   HPI  Dustin Crane is a 83 y.o. male who presents to the ED for evaluation of Seizures   I review a neurology clinic visit from April.  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  Initial history is somewhat limited, patient comes in by EMS, ambulates back to the room and reports he feels fine and has no concerns.  Reports his wife had concerns of 4 episodes this morning but he is uncertain what she was concerned about and indicates that he is fine.  Wife reports increased frequency of possible seizure-like episodes this morning.  She recounts his typical episodes of him spacing out and not responding her perhaps 1 minute at a time.  Suspected focal seizures from previous stroke for which she is on Lamictal .  She reports this morning he has had 3 or 4 separate episodes of this typical spell she reports concerned due to the increased frequency.  He has had poor sleep, they are planning to go on a trip today but has been compliant with medications.  No other recent illnesses or concerns   Physical Exam   Triage Vital Signs: ED Triage Vitals  Encounter Vitals Group     BP      Girls Systolic BP Percentile      Girls Diastolic BP Percentile      Boys Systolic BP Percentile      Boys Diastolic BP Percentile      Pulse      Resp      Temp      Temp src      SpO2      Weight      Height      Head Circumference      Peak Flow      Pain Score      Pain Loc      Pain Education      Exclude from Growth Chart     Most recent vital signs: Vitals:   10/29/23 1118  BP: (!) 145/85  Pulse: 85  Resp: 18  Temp: 98 F (36.7 C)  SpO2: 100%    General: Awake, no distress.  CV:  Good peripheral  perfusion.  Resp:  Normal effort.  Abd:  No distention.  MSK:  No deformity noted.  No signs of trauma or injury Neuro:  No focal deficits appreciated. Other:  I do witness 1 of these episodes in the ED: He is sitting upright in bed, 1000 yards stare and not responding.  Breathing independently, no seizure activity to the extremities.  Fairly flaccid bilateral lower extremities that I can passively range and feel muscle is firing but he is not holding his legs up.  Appears symmetric bilaterally.  By time I do the above exam, before I can assess upper extremities or cranial nerves, his symptoms resolved and he is back to baseline, making jokes.   ED Results / Procedures / Treatments   Labs (all labs ordered are listed, but only abnormal results are displayed) Labs Reviewed  COMPREHENSIVE METABOLIC PANEL WITH GFR - Abnormal; Notable for the following components:      Result Value   Glucose, Bld 207 (*)    BUN 26 (*)  All other components within normal limits  URINALYSIS, ROUTINE W REFLEX MICROSCOPIC - Abnormal; Notable for the following components:   Color, Urine STRAW (*)    APPearance CLEAR (*)    pH 9.0 (*)    Glucose, UA >=500 (*)    All other components within normal limits  CBC WITH DIFFERENTIAL/PLATELET  MAGNESIUM     EKG   RADIOLOGY   Official radiology report(s): No results found.  PROCEDURES and INTERVENTIONS:  .1-3 Lead EKG Interpretation  Performed by: Claudene Rover, MD Authorized by: Claudene Rover, MD     Interpretation: normal     ECG rate:  80   ECG rate assessment: normal     Rhythm: sinus rhythm     Ectopy: none     Conduction: normal     Medications  lamoTRIgine  (LAMICTAL ) tablet 100 mg (100 mg Oral Given 10/29/23 1122)  lamoTRIgine  (LAMICTAL ) tablet 50 mg (50 mg Oral Given 10/29/23 1206)     IMPRESSION / MDM / ASSESSMENT AND PLAN / ED COURSE  I reviewed the triage vital signs and the nursing notes.  Differential diagnosis includes, but is  not limited to, seizure, medication noncompliance, dehydration, hypomagnesemia  {Patient presents with symptoms of an acute illness or injury that is potentially life-threatening.  Patient presents to the ED with increased frequency of likely focal seizures.  Will provide dose of his home medications.  He remains at baseline here in the ED.  Does have 1 brief episode of a staring spell that seems to be a typical episode that wife is seeing at home.  This lasted less than 1 minute and he returns back to baseline without a significant postictal period.  He has essentially normal workup with UA, CBC, magnesium  without acute pathology.  Mild hyperglycemia without acidosis.  I considered admission for this patient but believe he be suitable for outpatient management with neurology follow-up considering this is a well-described phenomenon for him  Clinical Course as of 10/29/23 1446  Sat Oct 29, 2023  1149 Reassessed, wife at the bedside who provides helpful history.  I am initially called to the room as patient is having another difficult spell staring, poor speech, resolved while in the room evaluating him.  No seizure activity or clear coexisting deficits. [DS]  1417 Reassessed. Doing well. No further episodes [DS]    Clinical Course User Index [DS] Claudene Rover, MD     FINAL CLINICAL IMPRESSION(S) / ED DIAGNOSES   Final diagnoses:  Seizure-like activity (HCC)     Rx / DC Orders   ED Discharge Orders     None        Note:  This document was prepared using Dragon voice recognition software and may include unintentional dictation errors.   Claudene Rover, MD 10/29/23 [phone]

## 2023-10-29 NOTE — ED Triage Notes (Signed)
 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 Pt is on medication for seizures but has not had his dose today. Pt is A&O x4 and ambulatory. No pain at this time. Seizures were witnessed by wife

## 2023-10-29 NOTE — Discharge Instructions (Signed)
 Continue lamotrigine /Lamictal  as prescribed.  Follow-up with neurology  Return to the ED for any worsening symptoms
